# Patient Record
Sex: Female | Born: 1965 | Race: White | Hispanic: No | Smoking: Current every day smoker
Health system: Southern US, Community
[De-identification: ages and names within clinical notes are randomized; demographics above are authoritative.]

## PROBLEM LIST (undated history)

## (undated) DIAGNOSIS — A048 Other specified bacterial intestinal infections: Secondary | ICD-10-CM

## (undated) DIAGNOSIS — F419 Anxiety disorder, unspecified: Secondary | ICD-10-CM

## (undated) DIAGNOSIS — K589 Irritable bowel syndrome without diarrhea: Secondary | ICD-10-CM

## (undated) DIAGNOSIS — M549 Dorsalgia, unspecified: Secondary | ICD-10-CM

## (undated) DIAGNOSIS — IMO0001 Reserved for inherently not codable concepts without codable children: Secondary | ICD-10-CM

## (undated) DIAGNOSIS — R569 Unspecified convulsions: Secondary | ICD-10-CM

## (undated) DIAGNOSIS — K219 Gastro-esophageal reflux disease without esophagitis: Secondary | ICD-10-CM

## (undated) DIAGNOSIS — M791 Myalgia, unspecified site: Secondary | ICD-10-CM

## (undated) DIAGNOSIS — M797 Fibromyalgia: Secondary | ICD-10-CM

## (undated) DIAGNOSIS — G43909 Migraine, unspecified, not intractable, without status migrainosus: Secondary | ICD-10-CM

## (undated) DIAGNOSIS — E785 Hyperlipidemia, unspecified: Secondary | ICD-10-CM

## (undated) DIAGNOSIS — M1990 Unspecified osteoarthritis, unspecified site: Secondary | ICD-10-CM

## (undated) HISTORY — DX: Anxiety disorder, unspecified: F41.9

## (undated) HISTORY — DX: Reserved for inherently not codable concepts without codable children: IMO0001

## (undated) HISTORY — DX: Other specified bacterial intestinal infections: A04.8

## (undated) HISTORY — DX: Myalgia, unspecified site: M79.10

## (undated) HISTORY — DX: Irritable bowel syndrome, unspecified: K58.9

## (undated) HISTORY — DX: Gastro-esophageal reflux disease without esophagitis: K21.9

## (undated) HISTORY — PX: HIP ARTHROPLASTY: SHX981

## (undated) HISTORY — PX: ABDOMINAL HYSTERECTOMY: SHX81

## (undated) HISTORY — PX: FOOT SURGERY: SHX648

## (undated) HISTORY — DX: Hyperlipidemia, unspecified: E78.5

## (undated) HISTORY — DX: Migraine, unspecified, not intractable, without status migrainosus: G43.909

## (undated) HISTORY — PX: OTHER SURGICAL HISTORY: SHX169

---

## 2004-06-01 ENCOUNTER — Ambulatory Visit: Payer: Self-pay | Admitting: Family Medicine

## 2005-06-07 ENCOUNTER — Ambulatory Visit: Payer: Self-pay

## 2005-09-11 ENCOUNTER — Ambulatory Visit: Payer: Self-pay | Admitting: Gastroenterology

## 2006-02-17 HISTORY — PX: COLONOSCOPY: SHX174

## 2006-07-02 ENCOUNTER — Ambulatory Visit: Payer: Self-pay | Admitting: Family Medicine

## 2007-07-08 ENCOUNTER — Ambulatory Visit: Payer: Self-pay | Admitting: Family Medicine

## 2007-07-10 ENCOUNTER — Ambulatory Visit: Payer: Self-pay | Admitting: Family Medicine

## 2007-07-31 ENCOUNTER — Encounter: Admission: RE | Admit: 2007-07-31 | Discharge: 2007-07-31 | Payer: Self-pay | Admitting: Surgery

## 2007-08-21 HISTORY — PX: APPENDECTOMY: SHX54

## 2007-09-24 ENCOUNTER — Inpatient Hospital Stay: Payer: Self-pay | Admitting: Vascular Surgery

## 2008-07-01 ENCOUNTER — Encounter: Admission: RE | Admit: 2008-07-01 | Discharge: 2008-07-01 | Payer: Self-pay | Admitting: Surgery

## 2008-11-05 ENCOUNTER — Encounter: Admission: RE | Admit: 2008-11-05 | Discharge: 2008-11-05 | Payer: Self-pay | Admitting: Neurosurgery

## 2009-07-06 ENCOUNTER — Encounter: Admission: RE | Admit: 2009-07-06 | Discharge: 2009-07-06 | Payer: Self-pay | Admitting: Surgery

## 2009-09-15 ENCOUNTER — Encounter: Admission: RE | Admit: 2009-09-15 | Discharge: 2009-09-15 | Payer: Self-pay | Admitting: Neurosurgery

## 2010-07-10 ENCOUNTER — Encounter: Admission: RE | Admit: 2010-07-10 | Discharge: 2010-07-10 | Payer: Self-pay | Admitting: Surgery

## 2010-09-09 ENCOUNTER — Other Ambulatory Visit: Payer: Self-pay | Admitting: Surgery

## 2010-09-09 DIAGNOSIS — Z1239 Encounter for other screening for malignant neoplasm of breast: Secondary | ICD-10-CM

## 2010-11-11 ENCOUNTER — Ambulatory Visit: Payer: Self-pay | Admitting: Internal Medicine

## 2011-04-24 ENCOUNTER — Emergency Department: Payer: Self-pay | Admitting: Emergency Medicine

## 2011-06-26 ENCOUNTER — Ambulatory Visit: Payer: Self-pay | Admitting: Obstetrics and Gynecology

## 2011-07-02 ENCOUNTER — Ambulatory Visit: Payer: Self-pay | Admitting: Obstetrics and Gynecology

## 2011-07-16 ENCOUNTER — Ambulatory Visit
Admission: RE | Admit: 2011-07-16 | Discharge: 2011-07-16 | Disposition: A | Discharge status: Home / Self Care | Payer: 59 | Source: Ambulatory Visit | Attending: Surgery | Admitting: Surgery

## 2011-07-16 DIAGNOSIS — Z1239 Encounter for other screening for malignant neoplasm of breast: Secondary | ICD-10-CM

## 2011-08-17 ENCOUNTER — Emergency Department: Payer: Self-pay | Admitting: Emergency Medicine

## 2011-11-23 ENCOUNTER — Other Ambulatory Visit: Payer: Self-pay | Admitting: Neurosurgery

## 2011-11-23 DIAGNOSIS — M549 Dorsalgia, unspecified: Secondary | ICD-10-CM

## 2011-11-29 ENCOUNTER — Ambulatory Visit
Admission: RE | Admit: 2011-11-29 | Discharge: 2011-11-29 | Disposition: A | Discharge status: Home / Self Care | Payer: 59 | Source: Ambulatory Visit | Attending: Neurosurgery | Admitting: Neurosurgery

## 2011-11-29 DIAGNOSIS — M549 Dorsalgia, unspecified: Secondary | ICD-10-CM

## 2013-02-13 DIAGNOSIS — M069 Rheumatoid arthritis, unspecified: Secondary | ICD-10-CM | POA: Insufficient documentation

## 2013-02-13 DIAGNOSIS — G8929 Other chronic pain: Secondary | ICD-10-CM | POA: Insufficient documentation

## 2013-02-13 DIAGNOSIS — R519 Headache, unspecified: Secondary | ICD-10-CM | POA: Insufficient documentation

## 2013-02-16 DIAGNOSIS — F418 Other specified anxiety disorders: Secondary | ICD-10-CM | POA: Insufficient documentation

## 2013-04-22 ENCOUNTER — Other Ambulatory Visit: Payer: Self-pay | Admitting: Obstetrics and Gynecology

## 2013-04-22 DIAGNOSIS — Z1231 Encounter for screening mammogram for malignant neoplasm of breast: Secondary | ICD-10-CM

## 2013-05-13 ENCOUNTER — Ambulatory Visit
Admission: RE | Admit: 2013-05-13 | Discharge: 2013-05-13 | Disposition: A | Discharge status: Home / Self Care | Payer: Self-pay | Source: Ambulatory Visit | Attending: Obstetrics and Gynecology | Admitting: Obstetrics and Gynecology

## 2013-05-13 DIAGNOSIS — Z1231 Encounter for screening mammogram for malignant neoplasm of breast: Secondary | ICD-10-CM

## 2013-05-21 ENCOUNTER — Telehealth: Payer: Self-pay | Admitting: *Deleted

## 2013-05-21 NOTE — Telephone Encounter (Signed)
LEFT MESSAGE FOR PATIENT TO CALL BACK

## 2013-05-25 NOTE — Telephone Encounter (Signed)
EXPLAINED TO PATIENT THAT WE ARE UNABLE AT THIS TIME TO MOVE HER SURGERY DATE UP . PT UNDERSTOOD AND STATED THAT SHE MAY NEED TO RESCHEDULE SHE WILL CALL us BACK. WE ARE LEAVING HER ON THE SCHEDULE FOR 10.31.2014 FOR NOW

## 2013-06-04 ENCOUNTER — Encounter: Payer: Self-pay | Admitting: Podiatry

## 2013-06-04 ENCOUNTER — Encounter: Payer: Self-pay | Admitting: *Deleted

## 2013-06-04 ENCOUNTER — Ambulatory Visit (INDEPENDENT_AMBULATORY_CARE_PROVIDER_SITE_OTHER): Payer: Medicare Other | Admitting: Podiatry

## 2013-06-04 VITALS — BP 125/80 | HR 79 | Resp 16 | Ht 70.0 in | Wt 200.0 lb

## 2013-06-04 DIAGNOSIS — G5760 Lesion of plantar nerve, unspecified lower limb: Secondary | ICD-10-CM

## 2013-06-04 DIAGNOSIS — D219 Benign neoplasm of connective and other soft tissue, unspecified: Secondary | ICD-10-CM

## 2013-06-04 DIAGNOSIS — G588 Other specified mononeuropathies: Secondary | ICD-10-CM | POA: Insufficient documentation

## 2013-06-04 DIAGNOSIS — G5781 Other specified mononeuropathies of right lower limb: Secondary | ICD-10-CM

## 2013-06-04 NOTE — Progress Notes (Signed)
Abigail [name] presents today for chief complaint of a painful area to the dorsal lateral aspect of her right foot that is radiating into her third and fourth toes of her right foot. States that the pain is so bad she can hardly sleep at night. We are scheduled for surgery in the near future to release the second metatarsophalangeal joint right remove the screw to the third digit right.  Objective: Vital signs are stable she is alert and oriented x3. She's a little put out today because she had to wait in the lobby because she was late for her appointment. That was resolved. I reviewed her past medical history medications and allergies. Currently pulses to the right lower extremity are intact. She still has overlapping second toe over the third toe at this time. She also has an area of erythema between the third and fourth digits of the right foot. She has a palpable Mulder's click to the third interdigital space of the right foot. It appears that her neuroma has returned.  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  Plan: We discussed the etiology pathology conservative versus surgical therapies and at this time she would like to at a neurectomy to her consent form. This was performed today and we change the date of surgery. I also injected 2 mg of dexamethasone to the third interdigital space of the right foot. She tolerated this procedure well and I will followup with her at the time of surgery. We did discuss that she would probably have to use DILAUDID for postop pain control.

## 2013-06-04 NOTE — Progress Notes (Signed)
N HURT L RIGHT FOOT TOP AND BOTTOM AROUND 3RD and 4th DIGITS D 67M O SLOWLY C WORSE A EVERYTHING T SOAK IN EPSON SALT, ICE PACK, HEATING PAD, OINTMENT

## 2013-06-24 ENCOUNTER — Ambulatory Visit: Payer: 59 | Admitting: Podiatry

## 2013-06-25 ENCOUNTER — Encounter: Payer: Self-pay | Admitting: Podiatry

## 2013-07-23 ENCOUNTER — Ambulatory Visit: Payer: 59 | Admitting: Podiatry

## 2013-07-27 ENCOUNTER — Ambulatory Visit: Payer: 59 | Admitting: Podiatry

## 2013-07-30 ENCOUNTER — Encounter: Payer: Self-pay | Admitting: Podiatry

## 2013-07-30 ENCOUNTER — Ambulatory Visit (INDEPENDENT_AMBULATORY_CARE_PROVIDER_SITE_OTHER): Payer: Medicare Other

## 2013-07-30 ENCOUNTER — Ambulatory Visit: Payer: Medicare Other | Admitting: Podiatry

## 2013-07-30 VITALS — BP 122/78 | HR 70 | Resp 16

## 2013-07-30 DIAGNOSIS — B351 Tinea unguium: Secondary | ICD-10-CM

## 2013-07-30 DIAGNOSIS — R52 Pain, unspecified: Secondary | ICD-10-CM

## 2013-07-30 DIAGNOSIS — M7750 Other enthesopathy of unspecified foot: Secondary | ICD-10-CM

## 2013-07-30 DIAGNOSIS — M778 Other enthesopathies, not elsewhere classified: Secondary | ICD-10-CM

## 2013-07-30 NOTE — Progress Notes (Signed)
   Subjective:    Patient ID: Abigail [name], female    DOB: Nov 24, 1965, 47 y.o.   MRN: [id number]  HPI Comments: Left ankle i would like a xray for , i fell out of the kitchen door and its been swollen and hurting      Review of Systems     Objective:   Physical Exam: I have reviewed her past medical history medications allergies. Vascular evaluation is intact. She has pain on palpation third metatarsophalangeal joint of the right foot. As well as pain to the dorsal aspect of the left foot. Radiographic evaluation of the left ankle does not demonstrate any type of osseous abnormalities. No soft tissue malformations can be seen.        Assessment & Plan:  Assessment: Capsulitis third metatarsophalangeal joint right foot with neuroma. 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 No ankle abnormalities.  Plan: Injected around the third metatarsal phalangeal joint today. Injected the dorsal aspect of the left foot today.

## 2013-08-31 ENCOUNTER — Telehealth: Payer: Self-pay | Admitting: *Deleted

## 2013-08-31 NOTE — Telephone Encounter (Signed)
PT CALLED AND CANCELLED SURGERY ON 1.16.15 WITH DR Milinda Pointer DUE TO NOT HAVING ANY INSURANCE UNTIL 2.1.15. I HAD CALLED AND L/M WITH PT LETTING HER KNOW I RECEIVED MESSAGE ABOUT CANCELLING SURGERY AND TO CALL BACK WHEN SHE HAD HER INS INFO STRAIGHTENED OUT SO WE CAN [facility] SURGERY AGAIN.

## 2013-09-03 ENCOUNTER — Encounter: Payer: Self-pay | Admitting: Podiatry

## 2013-09-10 ENCOUNTER — Encounter: Payer: Self-pay | Admitting: Podiatry

## 2013-10-13 ENCOUNTER — Emergency Department: Payer: Self-pay | Admitting: Emergency Medicine

## 2013-10-13 LAB — URINALYSIS, COMPLETE
BACTERIA: NONE SEEN
BILIRUBIN, UR: NEGATIVE
BLOOD: NEGATIVE
Glucose,UR: NEGATIVE mg/dL (ref 0–75)
KETONE: NEGATIVE
Leukocyte Esterase: NEGATIVE
NITRITE: NEGATIVE
PH: 8 (ref 4.5–8.0)
Protein: NEGATIVE
RBC,UR: 1 /HPF (ref 0–5)
Specific Gravity: 1.011 (ref 1.003–1.030)
WBC UR: <1 /HPF (ref 0–5)

## 2013-10-13 LAB — DRUG SCREEN, URINE

## 2013-10-13 LAB — COMPREHENSIVE METABOLIC PANEL
AST: 61 U/L — AB (ref 15–37)
Albumin: 3.4 g/dL (ref 3.4–5.0)
Alkaline Phosphatase: 129 U/L — ABNORMAL HIGH
Anion Gap: 6 — ABNORMAL LOW (ref 7–16)
BILIRUBIN TOTAL: 0.5 mg/dL (ref 0.2–1.0)
BUN: 9 mg/dL (ref 7–18)
CALCIUM: 8.5 mg/dL (ref 8.5–10.1)
CHLORIDE: 106 mmol/L (ref 98–107)
Co2: 24 mmol/L (ref 21–32)
Creatinine: 0.7 mg/dL (ref 0.60–1.30)
EGFR (Non-African Amer.): >60
GLUCOSE: 99 mg/dL (ref 65–99)
Osmolality: 271 (ref 275–301)
POTASSIUM: 4 mmol/L (ref 3.5–5.1)
SGPT (ALT): 44 U/L (ref 12–78)
SODIUM: 136 mmol/L (ref 136–145)
Total Protein: 7.2 g/dL (ref 6.4–8.2)

## 2013-10-13 LAB — CBC
HCT: 36.7 % (ref 35.0–47.0)
HGB: 11.8 g/dL — AB (ref 12.0–16.0)
MCH: 30.6 pg (ref 26.0–34.0)
MCHC: 32 g/dL (ref 32.0–36.0)
MCV: 96 fL (ref 80–100)
PLATELETS: 115 10*3/uL — AB (ref 150–440)
RBC: 3.84 10*6/uL (ref 3.80–5.20)
RDW: 14 % (ref 11.5–14.5)
WBC: 8.5 10*3/uL (ref 3.6–11.0)

## 2013-10-13 LAB — LIPASE, BLOOD: Lipase: 85 U/L (ref 73–393)

## 2013-12-14 ENCOUNTER — Ambulatory Visit (INDEPENDENT_AMBULATORY_CARE_PROVIDER_SITE_OTHER): Payer: Medicare Other | Admitting: Podiatry

## 2013-12-14 ENCOUNTER — Encounter: Payer: Self-pay | Admitting: Podiatry

## 2013-12-14 ENCOUNTER — Ambulatory Visit (INDEPENDENT_AMBULATORY_CARE_PROVIDER_SITE_OTHER): Payer: Medicare Other

## 2013-12-14 VITALS — BP 120/75 | HR 73 | Resp 16

## 2013-12-14 DIAGNOSIS — M779 Enthesopathy, unspecified: Secondary | ICD-10-CM

## 2013-12-14 NOTE — Progress Notes (Signed)
She presents today with a chief complaint of painful dorsal lateral aspect of her left foot.  Objective: Vital signs are stable she is alert and oriented x3. She has pain on palpation and on in range of motion of the subtalar joint left foot. She has mild erythema and warmth on palpation of the left foot.  Assessment: Capsulitis subtalar joint left.  Plan: Injected the subtalar joint today with Kenalog and local anesthetic after sterile Betadine skin prep. Followup with her as needed.

## 2014-01-07 ENCOUNTER — Ambulatory Visit: Payer: Medicare Other | Admitting: Podiatry

## 2014-01-28 ENCOUNTER — Ambulatory Visit: Payer: Medicare Other | Admitting: Podiatry

## 2014-02-10 ENCOUNTER — Ambulatory Visit (INDEPENDENT_AMBULATORY_CARE_PROVIDER_SITE_OTHER): Payer: Medicare Other | Admitting: Podiatry

## 2014-02-10 VITALS — BP 119/64 | HR 84 | Resp 16

## 2014-02-10 DIAGNOSIS — G5760 Lesion of plantar nerve, unspecified lower limb: Secondary | ICD-10-CM

## 2014-02-10 DIAGNOSIS — M7750 Other enthesopathy of unspecified foot: Secondary | ICD-10-CM

## 2014-02-10 DIAGNOSIS — G5781 Other specified mononeuropathies of right lower limb: Secondary | ICD-10-CM

## 2014-02-10 DIAGNOSIS — M779 Enthesopathy, unspecified: Principal | ICD-10-CM

## 2014-02-10 DIAGNOSIS — M778 Other enthesopathies, not elsewhere classified: Secondary | ICD-10-CM

## 2014-02-10 NOTE — Progress Notes (Signed)
She presents today for a followup of her subtalar joint capsulitis of her left foot which is been bothering her considerably. She's also complaining of painful neuroma to the second interdigital space of the right foot. She also has reactive hyperkeratosis plantar aspect of the bilateral foot.  Objective: Pulses are palpable bilateral. She has pain on palpation and on in range of motion of the top sinus tarsi and subtalar joint of the left foot. She has a palpable neuroma to the second intermetatarsal space of the right foot.  Assessment: Neuroma right foot second interdigital space. Subtalar joint capsulitis left foot. Reactive hyperkeratosis bilateral.  Plan: Dispensed a tri-lock brace today injected the subtalar joint after sterile Betadine skin prep and injected the neuroma right foot. I will followup with her in a few weeks.

## 2014-04-12 ENCOUNTER — Ambulatory Visit (INDEPENDENT_AMBULATORY_CARE_PROVIDER_SITE_OTHER): Payer: Medicare Other | Admitting: Podiatry

## 2014-04-12 VITALS — BP 130/77 | HR 75 | Resp 16

## 2014-04-12 DIAGNOSIS — G5760 Lesion of plantar nerve, unspecified lower limb: Secondary | ICD-10-CM

## 2014-04-12 DIAGNOSIS — M779 Enthesopathy, unspecified: Secondary | ICD-10-CM

## 2014-04-12 DIAGNOSIS — G5781 Other specified mononeuropathies of right lower limb: Secondary | ICD-10-CM

## 2014-04-12 NOTE — Progress Notes (Signed)
She presents today for followup of her capsulitis subtalar joint left foot. She states it is doing better than it was previously. She's also here for followup of her neuroma second interdigital space of the right foot.  Objective: Vital signs are stable she is alert and oriented x3 she has a palpable Mulder's click to the second interdigital space of the right foot. She has pain on palpation and on in range of motion of the subtalar joint left foot.   assessment: Pain in limb secondary to neuroma second interdigital space right foot capsulitis subtalar joint left.  Plan: Injected both sites today second dose of dehydrated alcohol right foot and Kenalog to the subtalar joint. When she is ready for surgery an MRI of the left foot will be necessary. At that time we will be looking for subtalar joint and ankle injuries.

## 2014-05-16 ENCOUNTER — Emergency Department: Payer: Self-pay | Admitting: Emergency Medicine

## 2014-05-24 ENCOUNTER — Emergency Department: Payer: Self-pay | Admitting: Emergency Medicine

## 2014-05-24 LAB — URINALYSIS, COMPLETE
BILIRUBIN, UR: NEGATIVE
Bacteria: NONE SEEN
Blood: NEGATIVE
Glucose,UR: NEGATIVE mg/dL (ref 0–75)
KETONE: NEGATIVE
LEUKOCYTE ESTERASE: NEGATIVE
NITRITE: NEGATIVE
PROTEIN: NEGATIVE
Ph: 6 (ref 4.5–8.0)
RBC,UR: 1 /HPF (ref 0–5)
Specific Gravity: 1.015 (ref 1.003–1.030)
WBC UR: <1 /HPF (ref 0–5)

## 2014-05-24 LAB — LIPASE, BLOOD: LIPASE: 120 U/L (ref 73–393)

## 2014-05-24 LAB — COMPREHENSIVE METABOLIC PANEL
ALBUMIN: 3.4 g/dL (ref 3.4–5.0)
ALT: 28 U/L
ANION GAP: 4 — AB (ref 7–16)
Alkaline Phosphatase: 98 U/L
BILIRUBIN TOTAL: 0.2 mg/dL (ref 0.2–1.0)
BUN: 7 mg/dL (ref 7–18)
CHLORIDE: 106 mmol/L (ref 98–107)
CO2: 31 mmol/L (ref 21–32)
Calcium, Total: 8.9 mg/dL (ref 8.5–10.1)
Creatinine: 0.8 mg/dL (ref 0.60–1.30)
Glucose: 100 mg/dL — ABNORMAL HIGH (ref 65–99)
OSMOLALITY: 279 (ref 275–301)
Potassium: 3.8 mmol/L (ref 3.5–5.1)
SGOT(AST): 24 U/L (ref 15–37)
Sodium: 141 mmol/L (ref 136–145)
Total Protein: 7.7 g/dL (ref 6.4–8.2)

## 2014-05-24 LAB — CBC WITH DIFFERENTIAL/PLATELET
BASOS ABS: 0.1 10*3/uL (ref 0.0–0.1)
BASOS PCT: 0.8 %
EOS ABS: 0.1 10*3/uL (ref 0.0–0.7)
Eosinophil %: 0.7 %
HCT: 44.1 % (ref 35.0–47.0)
HGB: 14.2 g/dL (ref 12.0–16.0)
Lymphocyte #: 2.5 10*3/uL (ref 1.0–3.6)
Lymphocyte %: 21.2 %
MCH: 30.8 pg (ref 26.0–34.0)
MCHC: 32.1 g/dL (ref 32.0–36.0)
MCV: 96 fL (ref 80–100)
MONOS PCT: 8 %
Monocyte #: 0.9 x10 3/mm (ref 0.2–0.9)
NEUTROS ABS: 8.1 10*3/uL — AB (ref 1.4–6.5)
Neutrophil %: 69.3 %
Platelet: 349 10*3/uL (ref 150–440)
RBC: 4.6 10*6/uL (ref 3.80–5.20)
RDW: 15.1 % — ABNORMAL HIGH (ref 11.5–14.5)
WBC: 11.7 10*3/uL — AB (ref 3.6–11.0)

## 2014-05-24 LAB — TROPONIN I

## 2014-05-26 ENCOUNTER — Ambulatory Visit: Payer: Medicare Other | Admitting: Podiatry

## 2014-07-20 ENCOUNTER — Other Ambulatory Visit: Payer: Self-pay | Admitting: Family Medicine

## 2014-07-20 DIAGNOSIS — Z1231 Encounter for screening mammogram for malignant neoplasm of breast: Secondary | ICD-10-CM

## 2014-07-27 ENCOUNTER — Ambulatory Visit: Payer: Self-pay

## 2014-08-03 ENCOUNTER — Encounter (INDEPENDENT_AMBULATORY_CARE_PROVIDER_SITE_OTHER): Payer: Self-pay

## 2014-08-03 ENCOUNTER — Ambulatory Visit
Admission: RE | Admit: 2014-08-03 | Discharge: 2014-08-03 | Disposition: A | Discharge status: Home / Self Care | Payer: Medicare Other | Source: Ambulatory Visit | Attending: Family Medicine | Admitting: Family Medicine

## 2014-08-03 DIAGNOSIS — Z1231 Encounter for screening mammogram for malignant neoplasm of breast: Secondary | ICD-10-CM

## 2015-01-15 ENCOUNTER — Emergency Department
Admission: EM | Admit: 2015-01-15 | Discharge: 2015-01-15 | Disposition: A | Discharge status: Home / Self Care | Payer: Medicare Other | Attending: Emergency Medicine | Admitting: Emergency Medicine

## 2015-01-15 ENCOUNTER — Encounter: Payer: Self-pay | Admitting: Emergency Medicine

## 2015-01-15 DIAGNOSIS — Z7982 Long term (current) use of aspirin: Secondary | ICD-10-CM | POA: Insufficient documentation

## 2015-01-15 DIAGNOSIS — R609 Edema, unspecified: Secondary | ICD-10-CM | POA: Insufficient documentation

## 2015-01-15 DIAGNOSIS — R600 Localized edema: Secondary | ICD-10-CM

## 2015-01-15 DIAGNOSIS — Z79899 Other long term (current) drug therapy: Secondary | ICD-10-CM | POA: Insufficient documentation

## 2015-01-15 DIAGNOSIS — L03317 Cellulitis of buttock: Secondary | ICD-10-CM

## 2015-01-15 DIAGNOSIS — Z87891 Personal history of nicotine dependence: Secondary | ICD-10-CM | POA: Insufficient documentation

## 2015-01-15 HISTORY — DX: Dorsalgia, unspecified: M54.9

## 2015-01-15 HISTORY — DX: Unspecified osteoarthritis, unspecified site: M19.90

## 2015-01-15 HISTORY — DX: Fibromyalgia: M79.7

## 2015-01-15 LAB — BASIC METABOLIC PANEL
Anion gap: 10 (ref 5–15)
CALCIUM: 8.7 mg/dL — AB (ref 8.9–10.3)
CO2: 28 mmol/L (ref 22–32)
Chloride: 102 mmol/L (ref 101–111)
Creatinine, Ser: 0.8 mg/dL (ref 0.44–1.00)
GFR calc Af Amer: >60 mL/min (ref >60)
GFR calc non Af Amer: >60 mL/min (ref >60)
GLUCOSE: 121 mg/dL — AB (ref 65–99)
Potassium: 3.3 mmol/L — ABNORMAL LOW (ref 3.5–5.1)
Sodium: 140 mmol/L (ref 135–145)

## 2015-01-15 LAB — CBC WITH DIFFERENTIAL/PLATELET
BASOS ABS: 0 10*3/uL (ref 0–0.1)
Basophils Relative: 1 %
EOS ABS: 0.2 10*3/uL (ref 0–0.7)
Eosinophils Relative: 2 %
HEMATOCRIT: 38 % (ref 35.0–47.0)
HEMOGLOBIN: 12.5 g/dL (ref 12.0–16.0)
Lymphocytes Relative: 28 %
Lymphs Abs: 2.3 10*3/uL (ref 1.0–3.6)
MCH: 30.1 pg (ref 26.0–34.0)
MCHC: 32.8 g/dL (ref 32.0–36.0)
MCV: 91.7 fL (ref 80.0–100.0)
Monocytes Absolute: 0.6 10*3/uL (ref 0.2–0.9)
Monocytes Relative: 8 %
Neutro Abs: 5.1 10*3/uL (ref 1.4–6.5)
Neutrophils Relative %: 61 %
Platelets: 285 10*3/uL (ref 150–440)
RBC: 4.14 MIL/uL (ref 3.80–5.20)
RDW: 13.7 % (ref 11.5–14.5)
WBC: 8.3 10*3/uL (ref 3.6–11.0)

## 2015-01-15 MED ORDER — HYDROCHLOROTHIAZIDE 12.5 MG PO TABS: 25.0000 mg | ORAL_TABLET | Freq: Every day | ORAL | Status: DC

## 2015-01-15 MED ORDER — CYCLOBENZAPRINE HCL 10 MG PO TABS: 10.0000 mg | ORAL_TABLET | Freq: Three times a day (TID) | ORAL | Status: DC | PRN

## 2015-01-15 MED ORDER — DOXYCYCLINE HYCLATE 100 MG PO TABS: 100.0000 mg | ORAL_TABLET | Freq: Two times a day (BID) | ORAL | Status: DC

## 2015-01-15 NOTE — Discharge Instructions (Signed)
Cellulitis Cellulitis is an infection of the skin and the tissue under the skin. The infected area is usually red and tender. This happens most often in the arms and lower legs. HOME CARE   Take your antibiotic medicine as told. Finish the medicine even if you start to feel better.  Keep the infected arm or leg raised (elevated).  Put a warm cloth on the area up to 4 times per day.  Only take medicines as told by your doctor.  Keep all doctor visits as told. GET HELP IF:  You see red streaks on the skin coming from the infected area.  Your red area gets bigger or turns a dark color.  Your bone or joint under the infected area is painful after the skin heals.  Your infection comes back in the same area or different area.  You have a puffy (swollen) bump in the infected area.  You have new symptoms.  You have a fever. GET HELP RIGHT AWAY IF:   You feel very sleepy.  You throw up (vomit) or have watery poop (diarrhea).  You feel sick and have muscle aches and pains. MAKE SURE YOU:   Understand these instructions.  Will watch your condition.  Will get help right away if you are not doing well or get worse. Document Released: 01/23/2008 Document Revised: 12/21/2013 Document Reviewed: 10/22/2011 [facility] Patient Information 2015 [place], [place]. This information is not intended to replace advice given to you by your health care provider. Make sure you discuss any questions you have with your health care provider.  Keep the wound clean, dry, and covered with antibiotic ointment. Follow-up with your provider as needed.

## 2015-01-15 NOTE — ED Provider Notes (Signed)
[facility] Emergency Department Provider Note ____________________________________________  Time seen: 1235  I have reviewed the triage vital signs and the nursing notes.  HISTORY  Chief Complaint Abscess  HPI Abigail [name] is a 49 y.o. female who reports to the ED with complaints of a tender, swollen area to the right buttocks since about a week ago. She describes initially thought was an insect bite, and she does admit to squeezing and ashen without significant relief. The next day after onset, she notes the area of redness was much larger. By Tuesday, 4 days ago, she noted spontaneous draining from the area area today she is here after being sent by her primary care provider's office for evaluation this lesion, as well as swelling to her lower legs for better than a week. She denies any leg pain or cramping, and does admit that she has been off of her HCTZ for about 2 months now.She is without fever, chills, sweats, nausea or any recent drainage from the lesion. She rates her pain a 6 out of 10 at triage  Past Medical History  Diagnosis Date  . Muscle pain   . IBS (irritable bowel syndrome)   . Reflux   . Migraines   . Anxiety   . Fibromyalgia   . Back pain   . Arthritis    Patient Active Problem List   Diagnosis Date Noted  . 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 Appendectomy    . Foot surgery    . C5 fusion    . C6 fusion    . C7 fusion    . L4 fusion    . L5 fusion    . S1 fusion    . Abdominal hysterectomy    . Hip arthroplasty     Current Outpatient Rx  Name  Route  Sig  Dispense  Refill  . ALPRAZolam (XANAX) 1 MG tablet      1 mg. TAKE THREE TIMES A DAY IF NEEDED         . Aspirin-Salicylamide-Caffeine (BC HEADACHE POWDER PO)   Oral   Take by mouth. TAKE 2-3 TIMES DAILY         . buPROPion (WELLBUTRIN XL) 300 MG 24 hr tablet   Oral   Take 300 mg by mouth 2 (two) times  daily.         . cyclobenzaprine (FLEXERIL) 10 MG tablet   Oral   Take 10 mg by mouth 3 (three) times daily as needed for muscle spasms.         . cyclobenzaprine (FLEXERIL) 10 MG tablet   Oral   Take 1 tablet (10 mg total) by mouth 3 (three) times daily as needed for muscle spasms.   15 tablet   0   . doxycycline (VIBRA-TABS) 100 MG tablet   Oral   Take 1 tablet (100 mg total) by mouth 2 (two) times daily.   20 tablet   0   . DULoxetine (CYMBALTA) 60 MG capsule   Oral   Take 60 mg by mouth 2 (two) times daily.         . folic acid (FOLVITE) 1 MG tablet   Oral   Take 1 mg by mouth daily.         Marland Kitchen gabapentin (NEURONTIN) 600 MG tablet      600 mg. TAKE TWO IN THE AM, TAKE ONE IN THE AFTERNOON AND TWO AT BEDTIME         .  hydrochlorothiazide (HYDRODIURIL) 12.5 MG tablet   Oral   Take 2 tablets (25 mg total) by mouth daily.   30 tablet   0   . methotrexate (RHEUMATREX) 2.5 MG tablet      Caution:Chemotherapy. Protect from light. TAKE EIGHT TABLETS AT ONCE, ONCE A WEEK         . oxyCODONE-acetaminophen (PERCOCET) 10-325 MG per tablet   Oral   Take 1 tablet by mouth every 4 (four) hours as needed for pain.          Allergies Codeine; Darvocet; Sulfa antibiotics; and Tape  No family history on file.  Social History History  Substance Use Topics  . Smoking status: Former Smoker -- 1.00 packs/day    Types: Cigarettes  . Smokeless tobacco: Current User     Comment: ELECTRONIC VAPOR  . Alcohol Use: Yes     Comment: OCCASIONALLY   Review of Systems  Constitutional: Negative for fever. Eyes: Negative for visual changes. ENT: Negative for sore throat. Cardiovascular: Negative for chest pain. Respiratory: Negative for shortness of breath. Gastrointestinal: Negative for abdominal pain, vomiting and diarrhea. Genitourinary: Negative for dysuria. Musculoskeletal: Negative for back pain. Skin: Negative for rash. Positive for cellulitis. Neurological:  Negative for headaches, focal weakness or numbness. ____________________________________________  PHYSICAL EXAM:  VITAL SIGNS: ED Triage Vitals  Enc Vitals Group     BP 01/15/15 1129 117/57 mmHg     Pulse Rate 01/15/15 1129 83     Resp --      Temp 01/15/15 1129 97.7 F (36.5 C)     Temp Source 01/15/15 1129 Oral     SpO2 01/15/15 1129 97 %     Weight 01/15/15 1129 215 lb (97.523 kg)     Height 01/15/15 1129 5' 9.5" (1.765 m)     Head Cir --      Peak Flow --      Pain Score 01/15/15 1129 6     Pain Loc --      Pain Edu? --      Excl. in [place]? --    Constitutional: Alert and oriented. Well appearing and in no distress. Eyes: Conjunctivae are normal. PERRL. Normal extraocular movements. ENT   Head: Normocephalic and atraumatic.   Nose: No congestion/rhinnorhea.   Mouth/Throat: Mucous membranes are moist.   Neck: No stridor. Hematological/Lymphatic/Immunilogical: No cervical lymphadenopathy. Cardiovascular: Normal rate, regular rhythm.  Respiratory: Normal respiratory effort.No wheezes/rales/rhonchi. Gastrointestinal: Soft and nontender. No distention. Musculoskeletal: Nontender with normal range of motion in all extremities.No lower extremity tenderness nor edema. Neurologic:  Normal speech and language. No gross focal neurologic deficits are appreciated. Skin:  Skin is warm, dry and intact. No rash noted. Right buttock with a 1 cm, well-circumscribed shallow ulcerative lesion. Central escar noted. No active drainage. Minimal induration noted.  Psychiatric: Mood and affect are normal. Patient exhibits appropriate insight and judgment. ____________________________________________   LABS  Labs Reviewed  BASIC METABOLIC PANEL - Abnormal; Notable for the following:    Potassium 3.3 (*)    Glucose, Bld 121 (*)    BUN <5 (*)    Calcium 8.7 (*)    All other components within normal limits  CBC WITH DIFFERENTIAL/PLATELET   INITIAL IMPRESSION / ASSESSMENT AND PLAN /  ED COURSE  Vital signs stable. Treatment for a superficial cellulitis to the right buttock. Prescription Doxycycline and cyclobenzaprine and HCTZ as a courtesy given to patient. Wound care and follow-up instructions given to the patient.  ____________________________________________  FINAL CLINICAL IMPRESSION(S) /  ED DIAGNOSES  Final diagnoses:  Cellulitis of buttock  Edema extremities     Melvenia Needles, PA-C 01/15/15 1406  Harvest Dark, MD 01/15/15 1501

## 2015-01-15 NOTE — ED Notes (Signed)
Possible tick bite/ bug bite / abscess to buttocks with drainage with bilateral leg swelling, no fever .

## 2015-07-06 ENCOUNTER — Ambulatory Visit (INDEPENDENT_AMBULATORY_CARE_PROVIDER_SITE_OTHER): Payer: Medicare Other | Admitting: Podiatry

## 2015-07-06 ENCOUNTER — Encounter: Payer: Self-pay | Admitting: Podiatry

## 2015-07-06 VITALS — BP 141/88 | HR 90 | Resp 18

## 2015-07-06 DIAGNOSIS — G5781 Other specified mononeuropathies of right lower limb: Secondary | ICD-10-CM

## 2015-07-06 DIAGNOSIS — L02611 Cutaneous abscess of right foot: Secondary | ICD-10-CM | POA: Diagnosis not present

## 2015-07-06 DIAGNOSIS — G5761 Lesion of plantar nerve, right lower limb: Secondary | ICD-10-CM

## 2015-07-06 DIAGNOSIS — M778 Other enthesopathies, not elsewhere classified: Secondary | ICD-10-CM

## 2015-07-06 DIAGNOSIS — M7750 Other enthesopathy of unspecified foot: Secondary | ICD-10-CM

## 2015-07-06 DIAGNOSIS — M779 Enthesopathy, unspecified: Secondary | ICD-10-CM

## 2015-07-06 DIAGNOSIS — L03031 Cellulitis of right toe: Secondary | ICD-10-CM | POA: Diagnosis not present

## 2015-07-06 MED ORDER — DOXYCYCLINE HYCLATE 100 MG PO TABS: 100.0000 mg | ORAL_TABLET | Freq: Two times a day (BID) | ORAL | Status: DC

## 2015-07-06 NOTE — Progress Notes (Signed)
She presents today with chief complaint of painful foot bilateral. She states that her little toe right foot is sore where she tore the nail off. She states that she still has pain between her third and fourth digits of her right foot which radiates up into her toes and up her foot and leg. She also has pain in her sinus tarsi area she points to that and states that it just aches. He denies change in her past medical history medications allergies surgeries and social history. She's being treated for rheumatology and chronic pain.  Objective: Vital signs are stable she is alert and oriented 3 pulses are palpable. Palpable Mulder's click to the third interdigital space of the right foot. Other neurologic sensorium is intact. Muscle strength is normal. Orthopedic evaluation of his wrist pain on end range of motion of the subtalar joint left. Cellulitis fifth digit right foot extending to the level of the PIPJ no drainage no. Hallux valgus deformity of the right foot with lateral deviation of the second toe at the PIPJ where an arthroplasty was performed previously.  Assessment: Neuroma third interdigital space right foot. Cellulitis fifth digit right. And subtalar joint osteoarthritis and capsulitis. Digital deformity second digit right with hallux valgus deformity.  Plan: Discussed etiology pathology conservative versus surgical therapies. Injected neuroma with Kenalog and local anesthetic. Injected Kenalog and local anesthetic to the sinus tarsi subtalar joint left foot. Started her on doxycycline twice daily 100 mg for 10 days. She will watch for signs and symptoms of worsening infection should any arise she will notify us immediately. Otherwise I will follow up with her in about 6 weeks. Discussed the need for surgical correction regarding the right foot second digit and bunion. We also will consider removing the screw from the third digit right foot.

## 2015-07-27 ENCOUNTER — Ambulatory Visit (INDEPENDENT_AMBULATORY_CARE_PROVIDER_SITE_OTHER): Payer: Medicare Other | Admitting: Podiatry

## 2015-07-27 ENCOUNTER — Encounter: Payer: Self-pay | Admitting: Podiatry

## 2015-07-27 VITALS — BP 136/84 | HR 74 | Resp 16

## 2015-07-27 DIAGNOSIS — G5761 Lesion of plantar nerve, right lower limb: Secondary | ICD-10-CM | POA: Diagnosis not present

## 2015-07-27 DIAGNOSIS — G5781 Other specified mononeuropathies of right lower limb: Secondary | ICD-10-CM

## 2015-07-27 NOTE — Progress Notes (Signed)
She presents today for follow-up of her infected toes 4 and 5 of the right foot. She states they're doing much better. She states that I know I need to have the surgery performed to the second and first toes on the right foot but I do still have time to do that yet.  Objective: Vital signs are stable she's alert and oriented 3 no erythema edema saline as drainage or odor to the fourth or fifth digits of the right foot. This appears to have gone to heal uneventfully.  Assessment: Well-healing cellulitis toes 4 and 5 right.  Plan: Follow up with me when necessary.

## 2016-01-05 ENCOUNTER — Encounter: Payer: Self-pay | Admitting: Podiatry

## 2016-01-05 ENCOUNTER — Telehealth: Payer: Self-pay | Admitting: Podiatry

## 2016-01-05 ENCOUNTER — Ambulatory Visit (INDEPENDENT_AMBULATORY_CARE_PROVIDER_SITE_OTHER): Payer: Medicare Other | Admitting: Podiatry

## 2016-01-05 ENCOUNTER — Ambulatory Visit (INDEPENDENT_AMBULATORY_CARE_PROVIDER_SITE_OTHER): Payer: Medicare Other

## 2016-01-05 VITALS — BP 141/80 | HR 88 | Resp 16

## 2016-01-05 DIAGNOSIS — T847XXS Infection and inflammatory reaction due to other internal orthopedic prosthetic devices, implants and grafts, sequela: Secondary | ICD-10-CM

## 2016-01-05 MED ORDER — AMOXICILLIN-POT CLAVULANATE 500-125 MG PO TABS: 1.0000 | ORAL_TABLET | Freq: Three times a day (TID) | ORAL | Status: DC

## 2016-01-05 NOTE — Progress Notes (Signed)
She presents today for chief complaint of a painful third digit right foot. She states there has been some oozing and draining from the tip of the toe which is exquisitely painful palpation she states that it seems to be opening up as a screw may be coming out.  Objective: Vital signs are stable alert and oriented 3. She does have a screw to the toe based on radiographs and it does appear to possibly have a proud head. I see no signs of bone breakdown. There is some mild erythema to the toe in a purulence and no malodor.  Assessment: Mild cellulitis third digit right foot possibly secondary to internal fixation.  Plan: We consented her today for excision or removal of the screw from the third digit of the right foot. I answered all the questions regarding this procedure to the best of my ability in layman's terms. She understood it was amenable to it. I also suggested that she start soaking the tenderness is also warm water I also recommended that she start Augmentin 500 mg 1 by mouth twice a day follow up with her in the near future for surgery.

## 2016-01-05 NOTE — Patient Instructions (Signed)

## 2016-01-05 NOTE — Telephone Encounter (Signed)
This patient called last night at 9:45.  She said her toe with a rod had become red and swollen and had become painful.  The third toe was even draining clear fluid.  Negative for chills and fever.  She had been attempting to get in to see her doctor but was unable to be scheduled until June.  I asked her if she had antibiotics at home and she said she had three amoxicillin remaining.  I told her to take two immediately and one in the morning.  She was to call for an appointment in the morning where x-rays and an evaluation and treatment could be given.   Gardiner Barefoot DPM

## 2016-01-18 ENCOUNTER — Ambulatory Visit: Payer: Medicare Other | Admitting: Podiatry

## 2016-01-18 ENCOUNTER — Other Ambulatory Visit: Payer: Self-pay | Admitting: Family Medicine

## 2016-01-18 DIAGNOSIS — Z1231 Encounter for screening mammogram for malignant neoplasm of breast: Secondary | ICD-10-CM

## 2016-01-25 ENCOUNTER — Other Ambulatory Visit: Payer: Self-pay | Admitting: Podiatry

## 2016-01-25 ENCOUNTER — Ambulatory Visit: Payer: Medicare Other | Admitting: Podiatry

## 2016-01-25 MED ORDER — CEPHALEXIN 500 MG PO CAPS: 500.0000 mg | ORAL_CAPSULE | Freq: Three times a day (TID) | ORAL | Status: DC

## 2016-01-25 MED ORDER — OXYCODONE-ACETAMINOPHEN 10-325 MG PO TABS: 1.0000 | ORAL_TABLET | Freq: Four times a day (QID) | ORAL | Status: DC | PRN

## 2016-01-26 ENCOUNTER — Ambulatory Visit
Admission: RE | Admit: 2016-01-26 | Discharge: 2016-01-26 | Disposition: A | Discharge status: Home / Self Care | Payer: Medicare Other | Source: Ambulatory Visit | Attending: Family Medicine | Admitting: Family Medicine

## 2016-01-26 DIAGNOSIS — Z1231 Encounter for screening mammogram for malignant neoplasm of breast: Secondary | ICD-10-CM

## 2016-01-27 ENCOUNTER — Encounter: Payer: Self-pay | Admitting: Podiatry

## 2016-01-27 ENCOUNTER — Telehealth: Payer: Self-pay | Admitting: *Deleted

## 2016-01-27 DIAGNOSIS — Z4889 Encounter for other specified surgical aftercare: Secondary | ICD-10-CM | POA: Diagnosis not present

## 2016-01-27 MED ORDER — CLINDAMYCIN HCL 150 MG PO CAPS: ORAL_CAPSULE | ORAL | Status: DC

## 2016-01-27 NOTE — Telephone Encounter (Addendum)
Judeen Hammans states pt is receiving another strength Oxycodone from another doctor and was written for Oxycodone by Dr. Milinda Pointer.  Judeen Hammans states pt is allergic to Keflex also.  I left message with Clarise Cruz at [facility] to inform Dr. Milinda Pointer, and I "in Basket" messaged.  Dr. Milinda Pointer states pt said she was having trouble with her liver, so he hand wrote her for OXYCODONE 5MG , and as far as the Keflex she was able to take it previously without problems.  Dr. Milinda Pointer states order Clindamycin 150mg  #20 one capsule bid. I informed Judeen Hammans - [facility] of Dr. Stephenie Acres change of Oxycodone and told her to hold Dr. Stephenie Acres rx until she had completed her previous Oxycodone 10/325mg  and discontinue the Keflex and I had ordered Clindamycin e-scribe.  Judeen Hammans states pt has a 30 days of Oxycodone 10/325.  01/30/2016-Post op courtesy call-Left message informing pt not to weight bear or dangle the foot more than 15 mins/hour, remain in the boot at all times and dressing until it is changed at the 1st POV, call with concerns.

## 2016-02-01 ENCOUNTER — Encounter: Payer: Self-pay | Admitting: Podiatry

## 2016-02-01 ENCOUNTER — Ambulatory Visit (INDEPENDENT_AMBULATORY_CARE_PROVIDER_SITE_OTHER): Payer: Medicare Other

## 2016-02-01 ENCOUNTER — Ambulatory Visit (INDEPENDENT_AMBULATORY_CARE_PROVIDER_SITE_OTHER): Payer: Medicare Other | Admitting: Podiatry

## 2016-02-01 DIAGNOSIS — Z9889 Other specified postprocedural states: Secondary | ICD-10-CM

## 2016-02-01 DIAGNOSIS — M7750 Other enthesopathy of unspecified foot: Secondary | ICD-10-CM | POA: Diagnosis not present

## 2016-02-01 DIAGNOSIS — T847XXS Infection and inflammatory reaction due to other internal orthopedic prosthetic devices, implants and grafts, sequela: Secondary | ICD-10-CM

## 2016-02-01 NOTE — Progress Notes (Signed)
She presents today for a postop visit regarding removal of internal fixation third toe  right foot. He states this is the first foot surgery they really didn't hurt. She denies fever chills nausea vomiting muscle aches and pains.  Objective: Vital signs are stable she is alert and oriented 3. No erythema cellulitis drainage or odor dry sterile dressing removed demonstrates sutures are intact of ends of the toes. Radiographs confirm complete arthrodesis with removal of internal fixation third toe left foot.  Assessment: Well-healed surgical foot left.  Plan: He redressed today with a Band-Aid follow-up with her in 1 week for suture removal.

## 2016-02-08 ENCOUNTER — Ambulatory Visit (INDEPENDENT_AMBULATORY_CARE_PROVIDER_SITE_OTHER): Payer: Medicare Other | Admitting: Podiatry

## 2016-02-08 ENCOUNTER — Encounter: Payer: Self-pay | Admitting: Podiatry

## 2016-02-08 VITALS — BP 133/76 | HR 76 | Resp 16

## 2016-02-08 DIAGNOSIS — T847XXS Infection and inflammatory reaction due to other internal orthopedic prosthetic devices, implants and grafts, sequela: Secondary | ICD-10-CM

## 2016-02-08 DIAGNOSIS — Z9889 Other specified postprocedural states: Secondary | ICD-10-CM

## 2016-02-08 NOTE — Progress Notes (Signed)
She presents today 2 weeks status post removal screw second digit right foot. He is doing well.  Objective: Vital signs are stable alert and oriented 3. Pulses are palpable. Sutures are intact. Once removed the suture line remains intact with no dehiscence.  Assessment: Well-healing surgical toe third right.  Plan: Follow up with me on an as-needed basis for repair of second toe.

## 2016-04-19 NOTE — Progress Notes (Signed)
DOS 01/27/2016 screw removal 3rd toe right foot

## 2016-04-29 DIAGNOSIS — A09 Infectious gastroenteritis and colitis, unspecified: Secondary | ICD-10-CM

## 2016-04-29 DIAGNOSIS — Z79891 Long term (current) use of opiate analgesic: Secondary | ICD-10-CM | POA: Insufficient documentation

## 2016-04-29 DIAGNOSIS — R1110 Vomiting, unspecified: Secondary | ICD-10-CM | POA: Insufficient documentation

## 2016-04-29 DIAGNOSIS — E860 Dehydration: Secondary | ICD-10-CM

## 2016-04-29 DIAGNOSIS — Z7960 Long term (current) use of unspecified immunomodulators and immunosuppressants: Secondary | ICD-10-CM | POA: Insufficient documentation

## 2016-04-29 DIAGNOSIS — Z79899 Other long term (current) drug therapy: Secondary | ICD-10-CM | POA: Insufficient documentation

## 2016-04-29 DIAGNOSIS — K529 Noninfective gastroenteritis and colitis, unspecified: Secondary | ICD-10-CM | POA: Insufficient documentation

## 2016-04-29 HISTORY — DX: Dehydration: E86.0

## 2016-04-29 HISTORY — DX: Infectious gastroenteritis and colitis, unspecified: A09

## 2016-11-12 ENCOUNTER — Ambulatory Visit (INDEPENDENT_AMBULATORY_CARE_PROVIDER_SITE_OTHER): Payer: Medicare Other | Admitting: General Surgery

## 2016-11-12 ENCOUNTER — Encounter: Payer: Self-pay | Admitting: General Surgery

## 2016-11-12 VITALS — BP 116/70 | HR 72 | Resp 12 | Ht 69.0 in | Wt 167.0 lb

## 2016-11-12 DIAGNOSIS — R197 Diarrhea, unspecified: Secondary | ICD-10-CM | POA: Diagnosis not present

## 2016-11-12 DIAGNOSIS — R1084 Generalized abdominal pain: Secondary | ICD-10-CM | POA: Diagnosis not present

## 2016-11-12 DIAGNOSIS — K219 Gastro-esophageal reflux disease without esophagitis: Secondary | ICD-10-CM | POA: Insufficient documentation

## 2016-11-12 DIAGNOSIS — R109 Unspecified abdominal pain: Secondary | ICD-10-CM | POA: Insufficient documentation

## 2016-11-12 DIAGNOSIS — R634 Abnormal weight loss: Secondary | ICD-10-CM | POA: Diagnosis not present

## 2016-11-12 HISTORY — DX: Diarrhea, unspecified: R19.7

## 2016-11-12 HISTORY — DX: Generalized abdominal pain: R10.84

## 2016-11-12 HISTORY — DX: Abnormal weight loss: R63.4

## 2016-11-12 MED ORDER — POLYETHYLENE GLYCOL 3350 17 GM/SCOOP PO POWD: 1.0000 | Freq: Once | ORAL | 0 refills | Status: DC

## 2016-11-12 NOTE — Progress Notes (Signed)
Patient ID: Abigail [name] [facility], female   DOB: 11-26-1965, 51 y.o.   MRN: [id number]  Chief Complaint  Patient presents with  . Other    endoscopy    HPI Bassett is a 51 y.o. female here for evaluation for an upper endoscopy and colonoscopy.Last colonoscopy was in 2007. Patient states she has had nausea and vomiting with diarrhea seen August 2017.  The worst of her diarrhea was having eight to ten bowel movements daily.  No vomiting in the last nine days. Patient has lost 70 pounds since August, 2017. Moves her bowels daily,she use Mirlax as needed. In September, 2017 she was diagnosed  with H-pylori and had C Diff. Patient had a Ct scan on 04/29/2016. Ultrasound done 07/2017.   The patient reports that she has had long-standing reflux which is essentially stable. She developed nausea and vomiting in August and this was associated with diarrhea. She reported having 1 loose watery stool per day. Somewhere during this time frame she reports that she was diagnosed with both H. pylori and C. difficile. I have incomplete records in regards to her previous treatment.  Last known colonoscopy was in 2007 completed by Lucilla Lame, MD.  the exam was completed for hematochezia. Normal except for small internal hemorrhoids.   Her pain medication is perscribed  Gerald Leitz PA, who is with [facility] in [place].     HPI   Past Medical History:  Diagnosis Date  . Anxiety   . Arthritis   . Back pain   . Fibromyalgia   . H. pylori infection   . Hyperlipidemia   . IBS (irritable bowel syndrome)   . Migraines   . Muscle pain   . Reflux     Past Surgical History:  Procedure Laterality Date  . ABDOMINAL HYSTERECTOMY    . APPENDECTOMY  2009  . C5 FUSION    . C6 FUSION    . C7 FUSION    . COLONOSCOPY  02/2006  . FOOT SURGERY    . HIP ARTHROPLASTY    . L4 FUSION    . L5 FUSION    . S1 FUSION      No family history on file.  Social History Social History   Substance Use Topics  . Smoking status: Current Every Day Smoker    Packs/day: 1.00    Types: Cigarettes  . Smokeless tobacco: Current User     Comment: ELECTRONIC VAPOR  . Alcohol use Yes     Comment: OCCASIONALLY    Allergies  Allergen Reactions  . Codeine   . Darvocet [Propoxyphene N-Acetaminophen]   . Sulfa Antibiotics   . Tape     Current Outpatient Prescriptions  Medication Sig Dispense Refill  . Biotin 5000 MCG TABS Take by mouth.    . DULoxetine (CYMBALTA) 60 MG capsule Take 60 mg by mouth 2 (two) times daily.    Marland Kitchen estradiol (ESTRACE) 2 MG tablet Take 2 mg by mouth daily.    . folic acid (FOLVITE) 1 MG tablet Take 1 mg by mouth daily.    Marland Kitchen gabapentin (NEURONTIN) 600 MG tablet Take 600 mg by mouth 3 (three) times daily.    Marland Kitchen levothyroxine (SYNTHROID, LEVOTHROID) 75 MCG tablet Take 75 mcg by mouth daily before breakfast.    . methotrexate (RHEUMATREX) 2.5 MG tablet Take 7.5 mg by mouth 3 (three) times a week.    . morphine (MS CONTIN) 15 MG 12 hr tablet Take 15 mg by mouth  4 (four) times daily as needed.     . pantoprazole (PROTONIX) 40 MG tablet Take 40 mg by mouth daily.    . ranitidine (ZANTAC) 300 MG capsule Take 300 mg by mouth every evening.    . simvastatin (ZOCOR) 20 MG tablet Take 20 mg by mouth daily.    . polyethylene glycol powder (GLYCOLAX/MIRALAX) powder Take 255 g by mouth once. 255 grams one bottle for colonoscopy prep 255 g 0   No current facility-administered medications for this visit.     Review of Systems Review of Systems  Constitutional: Negative.   Respiratory: Negative.   Cardiovascular: Negative.     Blood pressure 116/70, pulse 72, resp. rate 12, height 5\' 9"  (1.753 m), weight 167 lb (75.8 kg).  Physical Exam Physical Exam  Constitutional: She is oriented to person, place, and time. She appears well-developed and well-nourished.  Eyes: Conjunctivae are normal. No scleral icterus.  Neck: Neck supple.  Cardiovascular: Normal rate,  regular rhythm and normal heart sounds.   Pulmonary/Chest: Effort normal and breath sounds normal.  Lymphadenopathy:    She has no cervical adenopathy.  Neurological: She is alert and oriented to person, place, and time.  Skin: Skin is warm and dry.    Data Reviewed 04/29/2016 CT of the abdomen with IV contrast only completed at [facility]:  -- Pancolitis, possibly pseudomembranous colitis. No evidence of bowel obstruction, organized fluid collections, or perforation. -- Splenomegaly.  Result Narrative  EXAM: CT abdomen and pelvis with contrast DATE: 04/29/2016 8:18 PM ACCESSION: [id number] UN DICTATED: 04/29/2016 8:31 PM INTERPRETATION LOCATION: [place]: 51 years old Female with ABDOMINAL PAIN, (specify site in comments)-diffuse-  COMPARISON: None  TECHNIQUE: A spiral CT scan was obtained with IV contrast from the lung bases to the pubic symphysis.Images were reconstructed in the axial plane. Coronal and sagittal reformatted images were also provided for further evaluation.  FINDINGS:   LINES/DEVICES: Anterior spinal fixation hardware spanning L4-L5 and L5-S1. Partially imaged right total hip arthroplasty without evidence of acute complication.  LOWER CHEST: Bibasilar atelectasis. No pleural effusions.  ABDOMEN/PELVIS:  HEPATOBILIARY: Diffuse hepatic steatosis. No biliary ductal dilatation.  GALLBLADDER: Unremarkable. PANCREAS: The pancreas is atrophic. No focal lesions or pancreatic biliary ductal dilation. SPLEEN: Small splenule (2:22).The spleen measures 13.9 cm. ADRENAL GLANDS: Unremarkable.  KIDNEYS/URETERS: Unremarkable. BLADDER: Unremarkable.  BOWEL/PERITONEUM/RETROPERITONEUM: Surgical clips in the right lower quadrant. The colon appears decompressed, with diffuse mucosal/serosal enhancement and wall thickening of the majority of the colon and rectum. Mild pericolonic stranding and trace pericolonic fluid (2:83, 70). Small bowel is  unremarkable. No evidence of bowel obstruction.  REPRODUCTIVE ORGANS: Unremarkable.  VASCULATURE: Atherosclerotic calcification of the aorta and its branches. Portal, superior mesenteric, and splenic veins are patent.  LYMPH NODES: Prominent peripancreatic lymph nodes measuring up to 0.9 cm (2:27), likely reactive. No abdominopelvic or retroperitoneal adenopathy by size criteria.  BONES/SOFT TISSUES: Degenerative disease of the visualized spine. No acute osseous abnormalities.  These images were reviewed. The colon is essentially collapsed, with no distention or area of her colonic inflammation.  GI clinic notes of Lyla Glassing, NP from [facility] reviewed.   Recently completed abdominal ultrasound through her primary care office dated 10/31/2016 showed no evidence of gallstones. The spleen was reported to be of normal size (see above).    Assessment    Unexplained weight loss, history of diarrhea, possible history C. difficile based on patient report. Long-standing GE reflux.    Plan    Indications for upper and lower  endoscopy reviewed. Risks of the procedure discussed.   Colonoscopy with possible biopsy/polypectomy prn: Information regarding the procedure, including its potential risks and complications (including but not limited to perforation of the bowel, which may require emergency surgery to repair, and bleeding) was verbally given to the patient. Educational information regarding lower intestinal endoscopy was given to the patient. Written instructions for how to complete the bowel prep using Miralax were provided. The importance of drinking ample fluids to avoid dehydration as a result of the prep emphasized.  Patient has been scheduled for an upper and lower endoscopy on 11-27-16 at [facility].   This information has been scribed by Gaspar Cola CMA.   Robert Bellow 11/12/2016, 3:26 PM

## 2016-11-12 NOTE — Patient Instructions (Signed)
Colonoscopy, Adult A colonoscopy is an exam to look at the large intestine. It is done to check for problems, such as:  Lumps (tumors).  Growths (polyps).  Swelling (inflammation).  Bleeding. What happens before the procedure? Eating and drinking  Follow instructions from your doctor about eating and drinking. These instructions may include:  A few days before the procedure - follow a low-fiber diet.  Avoid nuts.  Avoid seeds.  Avoid dried fruit.  Avoid raw fruits.  Avoid vegetables.  1-3 days before the procedure - follow a clear liquid diet. Avoid liquids that have red or purple dye. Drink only clear liquids, such as:  Clear broth or bouillon.  Black coffee or tea.  Clear juice.  Clear soft drinks or sports drinks.  Gelatin dessert.  Popsicles.  On the day of the procedure - do not eat or drink anything during the 2 hours before the procedure. Bowel prep  If you were prescribed an oral bowel prep:  Take it as told by your doctor. Starting the day before your procedure, you will need to drink a lot of liquid. The liquid will cause you to poop (have bowel movements) until your poop is almost clear or light green.  If your skin or butt gets irritated from diarrhea, you may:  Wipe the area with wipes that have medicine in them, such as adult wet wipes with aloe and vitamin E.  Put something on your skin that soothes the area, such as petroleum jelly.  If you throw up (vomit) while drinking the bowel prep, take a break for up to 60 minutes. Then begin the bowel prep again. If you keep throwing up and you cannot take the bowel prep without throwing up, call your doctor. General instructions   Ask your doctor about changing or stopping your normal medicines. This is important if you take diabetes medicines or blood thinners.  Plan to have someone take you home from the hospital or clinic. What happens during the procedure?  An IV tube may be put into one of your  veins.  You will be given medicine to help you relax (sedative).  To reduce your risk of infection:  Your doctors will wash their hands.  Your anal area will be washed with soap.  You will be asked to lie on your side with your knees bent.  Your doctor will get a long, thin, flexible tube ready. The tube will have a camera and a light on the end.  The tube will be put into your anus.  The tube will be gently put into your large intestine.  Air will be delivered into your large intestine to keep it open. You may feel some pressure or cramping.  The camera will be used to take photos.  A small tissue sample may be removed from your body to be looked at under a microscope (biopsy). If any possible problems are found, the tissue will be sent to a lab for testing.  If small growths are found, your doctor may remove them and have them checked for cancer.  The tube that was put into your anus will be slowly removed. The procedure may vary among doctors and hospitals. What happens after the procedure?  Your doctor will check on you often until the medicines you were given have worn off.  Do not drive for 24 hours after the procedure.  You may have a small amount of blood in your poop.  You may pass gas.  You may  have mild cramps or bloating in your belly (abdomen).  It is up to you to get the results of your procedure. Ask your doctor, or the department performing the procedure, when your results will be ready. This information is not intended to replace advice given to you by your health care provider. Make sure you discuss any questions you have with your health care provider. Document Released: 09/08/2010 Document Revised: 06/06/2016 Document Reviewed: 10/18/2015 Elsevier Interactive Patient Education  2017 [place] Upper Endoscopy Upper endoscopy is a procedure to look inside the upper GI (gastrointestinal) tract. The upper GI tract is made up of:  The tube that  carries food and liquid from your throat to your stomach (esophagus).  The stomach.  The first part of your small intestine (duodenum). This procedure is also called esophagogastroduodenoscopy (EGD) or gastroscopy. In this procedure, your health care provider passes a thin, flexible tube (endoscope) through your mouth and down your esophagus into your stomach. A small camera is attached to the end of the tube. Images from the camera appear on a monitor in the exam room. During this procedure, your health care provider may also remove a small piece of tissue to be sent to a lab and examined under a microscope (biopsy). Your health care provider may do an upper endoscopy to diagnose cancers of the upper GI tract. You may also have this procedure to find the cause of other conditions, such as:  Stomach pain.  Heartburn.  Pain or problems when swallowing.  Nausea and vomiting.  Stomach bleeding.  Stomach ulcers. Tell a health care provider about:  Any allergies you have.  All medicines you are taking, including vitamins, herbs, eye drops, creams, and over-the-counter medicines.  Any problems you or family members have had with anesthetic medicines.  Any blood disorders you have.  Any surgeries you have had.  Any medical conditions you have.  Whether you are pregnant or may be pregnant. What are the risks? Generally, this is a safe procedure. However, problems may occur, including:  Infection.  Bleeding.  Allergic reactions to medicines.  A tear or hole (perforation) in the esophagus, stomach, or duodenum. What happens before the procedure?  Follow instructions from your health care provider about eating or drinking restrictions.  Ask your health care provider about changing or stopping your regular medicines. This is especially important if you are taking diabetes medicines or blood thinners.  Plan to have someone take you home after the procedure.  If you go home  right after the procedure, plan to have someone with you for 24 hours. What happens during the procedure?  An IV tube will be inserted into one of your veins.  Your throat may be sprayed with medicine that numbs the area (local anesthetic).  You may be given a medicine to help you relax (sedative).  You will lie on your left side.  Your health care provider will pass the endoscope through your mouth and down your esophagus.  Your provider will use the scope to check the inside of your esophagus, stomach, and duodenum. Biopsies may be taken. The procedure may vary among health care providers and hospitals. What happens after the procedure?  Do not drive for 24 hours if you received a sedative.  Your blood pressure, heart rate, breathing rate, and blood oxygen level will be monitored often until the medicines you were given have worn off.  When your throat is no longer numb, you may be given some fluids to drink.  It is your responsibility to get the results of your procedure. Ask your health care provider or the department performing the procedure when your results will be ready. This information is not intended to replace advice given to you by your health care provider. Make sure you discuss any questions you have with your health care provider. Document Released: 08/03/2000 Document Revised: 01/17/2016 Document Reviewed: 05/19/2015 Elsevier Interactive Patient Education  2017 [employer].

## 2016-11-26 ENCOUNTER — Telehealth: Payer: Self-pay | Admitting: *Deleted

## 2016-11-26 NOTE — Telephone Encounter (Signed)
Patient called the office wanting to reschedule her upper and lower endoscopy due to neck pain that she has been experiencing.   This has been rescheduled from 11-27-16 to 12-25-16 at [facility].   Trish in endoscopy has been notified of date change.

## 2016-11-28 ENCOUNTER — Telehealth: Payer: Self-pay | Admitting: *Deleted

## 2016-11-28 NOTE — Telephone Encounter (Signed)
Patient called the office today wanting to reschedule her upper and lower endoscopy to a morning procedure.   This patient states she takes morphine 15 mg by mouth 4 times daily.   Per Dr. Bary Castilla, patient would need to be an afternoon case to allow her to take her usual morning dose of morphine. She would hold all other doses until her scheduled afternoon procedure.   Patient verbalizes understanding and wishes to keep procedure as scheduled for 12-25-16.

## 2016-12-12 ENCOUNTER — Other Ambulatory Visit: Payer: Self-pay | Admitting: General Surgery

## 2016-12-17 ENCOUNTER — Telehealth: Payer: Self-pay | Admitting: *Deleted

## 2016-12-17 NOTE — Telephone Encounter (Signed)
Message for patient to call the office.   Patient is scheduled for an upper and lower endoscopy for 12-25-16 at [facility] with Dr. Bary Castilla.   We need to confirm that patient has had no medication changes since her last office visit. Also, confirm that patient has picked up the Miralax prescription.

## 2016-12-20 NOTE — Telephone Encounter (Signed)
Patient called the office back to report no change in medications since her last office visit. She reports that she has picked up her Miralax perscription. This patient was reminded about the clear liquid prep the day before. She is aware to call on Monday between 1 and 3 pm to get her arrival time for colonoscopy on Tuesday.  We will proceed with colonoscopy as scheduled for next week.   Patient was instructed to call the office should she have further questions.

## 2016-12-25 ENCOUNTER — Encounter: Payer: Self-pay | Admitting: Anesthesiology

## 2016-12-25 ENCOUNTER — Encounter
Admission: RE | Disposition: A | Discharge status: Home / Self Care | Payer: Self-pay | Source: Ambulatory Visit | Attending: General Surgery

## 2016-12-25 ENCOUNTER — Ambulatory Visit: Payer: Medicare Other | Admitting: Certified Registered Nurse Anesthetist

## 2016-12-25 ENCOUNTER — Ambulatory Visit
Admission: RE | Admit: 2016-12-25 | Discharge: 2016-12-25 | Disposition: A | Discharge status: Home / Self Care | Payer: Medicare Other | Source: Ambulatory Visit | Attending: General Surgery | Admitting: General Surgery

## 2016-12-25 DIAGNOSIS — K319 Disease of stomach and duodenum, unspecified: Secondary | ICD-10-CM | POA: Diagnosis not present

## 2016-12-25 DIAGNOSIS — D127 Benign neoplasm of rectosigmoid junction: Secondary | ICD-10-CM | POA: Diagnosis not present

## 2016-12-25 DIAGNOSIS — F419 Anxiety disorder, unspecified: Secondary | ICD-10-CM | POA: Insufficient documentation

## 2016-12-25 DIAGNOSIS — Z981 Arthrodesis status: Secondary | ICD-10-CM | POA: Insufficient documentation

## 2016-12-25 DIAGNOSIS — Z79899 Other long term (current) drug therapy: Secondary | ICD-10-CM | POA: Diagnosis not present

## 2016-12-25 DIAGNOSIS — Z6824 Body mass index (BMI) 24.0-24.9, adult: Secondary | ICD-10-CM | POA: Insufficient documentation

## 2016-12-25 DIAGNOSIS — K449 Diaphragmatic hernia without obstruction or gangrene: Secondary | ICD-10-CM | POA: Diagnosis not present

## 2016-12-25 DIAGNOSIS — K6389 Other specified diseases of intestine: Secondary | ICD-10-CM

## 2016-12-25 DIAGNOSIS — K635 Polyp of colon: Secondary | ICD-10-CM | POA: Insufficient documentation

## 2016-12-25 DIAGNOSIS — K529 Noninfective gastroenteritis and colitis, unspecified: Secondary | ICD-10-CM | POA: Insufficient documentation

## 2016-12-25 DIAGNOSIS — F17210 Nicotine dependence, cigarettes, uncomplicated: Secondary | ICD-10-CM | POA: Insufficient documentation

## 2016-12-25 DIAGNOSIS — K219 Gastro-esophageal reflux disease without esophagitis: Secondary | ICD-10-CM | POA: Diagnosis not present

## 2016-12-25 DIAGNOSIS — Z79890 Hormone replacement therapy (postmenopausal): Secondary | ICD-10-CM | POA: Insufficient documentation

## 2016-12-25 DIAGNOSIS — R634 Abnormal weight loss: Secondary | ICD-10-CM

## 2016-12-25 DIAGNOSIS — J449 Chronic obstructive pulmonary disease, unspecified: Secondary | ICD-10-CM | POA: Diagnosis not present

## 2016-12-25 DIAGNOSIS — E785 Hyperlipidemia, unspecified: Secondary | ICD-10-CM | POA: Diagnosis not present

## 2016-12-25 DIAGNOSIS — R197 Diarrhea, unspecified: Secondary | ICD-10-CM

## 2016-12-25 DIAGNOSIS — K2950 Unspecified chronic gastritis without bleeding: Secondary | ICD-10-CM | POA: Diagnosis not present

## 2016-12-25 HISTORY — PX: ESOPHAGOGASTRODUODENOSCOPY (EGD) WITH PROPOFOL: SHX5813

## 2016-12-25 HISTORY — PX: COLONOSCOPY WITH PROPOFOL: SHX5780

## 2016-12-25 SURGERY — ESOPHAGOGASTRODUODENOSCOPY (EGD) WITH PROPOFOL
Anesthesia: General

## 2016-12-25 MED ORDER — LIDOCAINE HCL (CARDIAC) 20 MG/ML IV SOLN
INTRAVENOUS | Status: DC | PRN
  Administered 2016-12-25: 100 mg via INTRAVENOUS

## 2016-12-25 MED ORDER — MIDAZOLAM HCL 2 MG/2ML IJ SOLN
INTRAMUSCULAR | Status: AC
  Filled 2016-12-25: qty 2

## 2016-12-25 MED ORDER — PROPOFOL 500 MG/50ML IV EMUL
INTRAVENOUS | Status: AC
  Filled 2016-12-25: qty 50

## 2016-12-25 MED ORDER — MIDAZOLAM HCL 5 MG/5ML IJ SOLN
INTRAMUSCULAR | Status: DC | PRN
  Administered 2016-12-25: 2 mg via INTRAVENOUS

## 2016-12-25 MED ORDER — GLYCOPYRROLATE 0.2 MG/ML IJ SOLN
INTRAMUSCULAR | Status: DC | PRN
  Administered 2016-12-25: .2 mg via INTRAVENOUS

## 2016-12-25 MED ORDER — PROPOFOL 500 MG/50ML IV EMUL
INTRAVENOUS | Status: DC | PRN
  Administered 2016-12-25: 140 ug/kg/min via INTRAVENOUS

## 2016-12-25 MED ORDER — FENTANYL CITRATE (PF) 100 MCG/2ML IJ SOLN
INTRAMUSCULAR | Status: AC
  Filled 2016-12-25: qty 2

## 2016-12-25 MED ORDER — IPRATROPIUM-ALBUTEROL 0.5-2.5 (3) MG/3ML IN SOLN
3.0000 mL | Freq: Once | RESPIRATORY_TRACT | Status: AC
  Administered 2016-12-25: 3 mL via RESPIRATORY_TRACT
  Filled 2016-12-25: qty 3

## 2016-12-25 MED ORDER — SODIUM CHLORIDE 0.9 % IV SOLN
INTRAVENOUS | Status: DC
  Administered 2016-12-25: 1000 mL via INTRAVENOUS

## 2016-12-25 MED ORDER — PROPOFOL 10 MG/ML IV BOLUS
INTRAVENOUS | Status: DC | PRN
  Administered 2016-12-25: 40 mg via INTRAVENOUS

## 2016-12-25 MED ORDER — FENTANYL CITRATE (PF) 100 MCG/2ML IJ SOLN
INTRAMUSCULAR | Status: DC | PRN
  Administered 2016-12-25: 100 ug via INTRAVENOUS

## 2016-12-25 NOTE — Transfer of Care (Signed)
Immediate Anesthesia Transfer of Care Note  Patient: Abigail [name]  Procedure(s) Performed: Procedure(s): ESOPHAGOGASTRODUODENOSCOPY (EGD) WITH PROPOFOL (N/A) COLONOSCOPY WITH PROPOFOL (N/A)  Patient Location: PACU and Endoscopy Unit  Anesthesia Type:General  Level of Consciousness: awake, alert  and oriented  Airway & Oxygen Therapy: Patient Spontanous Breathing and Patient connected to nasal cannula oxygen  Post-op Assessment: Report given to RN  Post vital signs: Reviewed and stable  Last Vitals:  Vitals:   12/25/16 1422 12/25/16 1423  BP:    Pulse:    Resp:    Temp: 36.2 C 36.2 C    Last Pain:  Vitals:   12/25/16 1423  TempSrc: Tympanic  PainSc:          Complications: No apparent anesthesia complications

## 2016-12-25 NOTE — H&P (Signed)
Abigail [name] [facility] [id number] 24-Feb-1966     HPI: [place] is a 51 y.o. female here for evaluation for an upper endoscopy and colonoscopy.Last colonoscopy was in 2007. Patient states she has had nausea and vomiting with diarrhea seen August 2017. No change in clinical history or exam.    Prescriptions Prior to Admission  Medication Sig Dispense Refill Last Dose  . Biotin 5000 MCG TABS Take by mouth.   12/24/2016 at Unknown time  . Cholecalciferol (VITAMIN D3) 10000 units TABS Take 10,000 Units/day by mouth daily.   12/24/2016 at Unknown time  . DULoxetine (CYMBALTA) 60 MG capsule Take 60 mg by mouth 2 (two) times daily.   12/24/2016 at Unknown time  . estradiol (ESTRACE) 2 MG tablet Take 2 mg by mouth daily.   Past Week at Unknown time  . folic acid (FOLVITE) 1 MG tablet Take 1 mg by mouth daily.   12/24/2016 at Unknown time  . gabapentin (NEURONTIN) 600 MG tablet Take 600 mg by mouth 3 (three) times daily.   12/24/2016 at Unknown time  . levothyroxine (SYNTHROID, LEVOTHROID) 75 MCG tablet Take 75 mcg by mouth daily before breakfast.   12/24/2016 at Unknown time  . methotrexate (RHEUMATREX) 2.5 MG tablet Take 7.5 mg by mouth 3 (three) times a week.   Past Week at Unknown time  . morphine (MS CONTIN) 15 MG 12 hr tablet Take 15 mg by mouth 4 (four) times daily as needed.    12/25/2016 at 0800  . pantoprazole (PROTONIX) 40 MG tablet Take 40 mg by mouth daily.   12/24/2016 at Unknown time  . ranitidine (ZANTAC) 300 MG capsule Take 300 mg by mouth every evening.   12/24/2016 at Unknown time  . simvastatin (ZOCOR) 20 MG tablet Take 20 mg by mouth daily.   Past Week at Unknown time  . polyethylene glycol powder (GLYCOLAX/MIRALAX) powder TAKE 255 GRAMS (1 BOTTLE) BY MOUTH FOR COLONOSCOPY PREP 255 g 0    Allergies  Allergen Reactions  . Codeine   . Darvocet [Propoxyphene N-Acetaminophen]   . Sulfa Antibiotics   . Tape    Past Medical History:  Diagnosis Date  . Anxiety   . Arthritis    . Back pain   . Fibromyalgia   . H. pylori infection   . Hyperlipidemia   . IBS (irritable bowel syndrome)   . Migraines   . Muscle pain   . Reflux    Past Surgical History:  Procedure Laterality Date  . ABDOMINAL HYSTERECTOMY    . APPENDECTOMY  2009  . C5 FUSION    . C6 FUSION    . C7 FUSION    . COLONOSCOPY  02/2006  . FOOT SURGERY    . HIP ARTHROPLASTY    . L4 FUSION    . L5 FUSION    . S1 FUSION     Social History   Social History  . Marital status: Divorced    Spouse name: N/A  . Number of children: N/A  . Years of education: N/A   Occupational History  . Not on file.   Social History Main Topics  . Smoking status: Current Every Day Smoker    Packs/day: 1.00    Types: Cigarettes  . Smokeless tobacco: Never Used     Comment: ELECTRONIC VAPOR  . Alcohol use Yes     Comment: OCCASIONALLY  . Drug use: No  . Sexual activity: Not on file   Other Topics Concern  .  Not on file   Social History Narrative  . No narrative on file   Social History   Social History Narrative  . No narrative on file     ROS: Negative.     PE: HEENT: Negative. Lungs: Clear. Cardio: RR.  Robert Bellow 12/25/2016   Assessment/Plan:  Proceed with planned endoscopy.

## 2016-12-25 NOTE — Anesthesia Preprocedure Evaluation (Signed)
Anesthesia Evaluation  Patient identified by MRN, date of birth, ID band Patient awake    Reviewed: Allergy & Precautions, H&P , NPO status , Patient's Chart, lab work & pertinent test results  History of Anesthesia Complications Negative for: history of anesthetic complications  Airway Mallampati: III  TM Distance: <3 FB Neck ROM: limited    Dental  (+) Poor Dentition, Chipped, Caps   Pulmonary neg shortness of breath, COPD, Current Smoker,    Pulmonary exam normal breath sounds clear to auscultation       Cardiovascular Exercise Tolerance: Good (-) angina(-) Past MI and (-) DOE negative cardio ROS Normal cardiovascular exam Rhythm:regular Rate:Normal     Neuro/Psych  Headaches,  Neuromuscular disease negative psych ROS   GI/Hepatic Neg liver ROS, GERD  Medicated and Controlled,  Endo/Other  negative endocrine ROS  Renal/GU negative Renal ROS  negative genitourinary   Musculoskeletal  (+) Arthritis , Fibromyalgia -  Abdominal   Peds  Hematology negative hematology ROS (+)   Anesthesia Other Findings Past Medical History: No date: Anxiety No date: Arthritis No date: Back pain No date: Fibromyalgia No date: H. pylori infection No date: Hyperlipidemia No date: IBS (irritable bowel syndrome) No date: Migraines No date: Muscle pain No date: Reflux  Past Surgical History: No date: ABDOMINAL HYSTERECTOMY 2009: APPENDECTOMY No date: C5 FUSION No date: C6 FUSION No date: C7 FUSION 02/2006: COLONOSCOPY No date: FOOT SURGERY No date: HIP ARTHROPLASTY No date: L4 FUSION No date: L5 FUSION No date: S1 FUSION     Reproductive/Obstetrics negative OB ROS                             Anesthesia Physical Anesthesia Plan  ASA: III  Anesthesia Plan: General   Post-op Pain Management:    Induction: Intravenous  Airway Management Planned: Natural Airway and Nasal  Cannula  Additional Equipment:   Intra-op Plan:   Post-operative Plan:   Informed Consent: I have reviewed the patients History and Physical, chart, labs and discussed the procedure including the risks, benefits and alternatives for the proposed anesthesia with the patient or authorized representative who has indicated his/her understanding and acceptance.   Dental Advisory Given  Plan Discussed with: Anesthesiologist, CRNA and Surgeon  Anesthesia Plan Comments: (Patient consented for risks of anesthesia including but not limited to:  - adverse reactions to medications - damage to teeth, lips or other oral mucosa - sore throat or hoarseness - Damage to heart, brain, lungs or loss of life  Patient voiced understanding.)        Anesthesia Quick Evaluation

## 2016-12-25 NOTE — Op Note (Signed)
[facility] Gastroenterology Patient Name: Abigail [name] Procedure Date: 12/25/2016 12:43 PM MRN: [id number] Account #: [id number] Date of Birth: 09/14/1965 Admit Type: Outpatient Age: 51 Room: [facility] ENDO ROOM 1 Gender: Female Note Status: Finalized Procedure:            Upper GI endoscopy Indications:          Weight loss Providers:            Robert Bellow, MD Referring MD:         Meindert A. Brunetta Genera, MD (Referring MD) Medicines:            Monitored Anesthesia Care Complications:        No immediate complications. Procedure:            Pre-Anesthesia Assessment:                       - Prior to the procedure, a History and Physical was                        performed, and patient medications, allergies and                        sensitivities were reviewed. The patient's tolerance of                        previous anesthesia was reviewed.                       - The risks and benefits of the procedure and the                        sedation options and risks were discussed with the                        patient. All questions were answered and informed                        consent was obtained.                       After obtaining informed consent, the endoscope was                        passed under direct vision. Throughout the procedure,                        the patient's blood pressure, pulse, and oxygen                        saturations were monitored continuously. The Endoscope                        was introduced through the mouth, and advanced to the                        third part of duodenum. The upper GI endoscopy was                        accomplished without difficulty. The patient tolerated  the procedure well. Findings:      The esophagus was normal. Biopsies at 37 cm completed based on past       history of hiatal hernia.      Patchy minimal inflammation was found in the gastric antrum. This was   biopsied with a cold forceps for histology.      The examined duodenum was normal. Impression:           - Normal esophagus.                       - Chronic gastritis. Biopsied.                       - Normal examined duodenum. Recommendation:       - Perform a colonoscopy today. Procedure Code(s):    --- Professional ---                       [phone], Esophagogastroduodenoscopy, flexible, transoral;                        with biopsy, single or multiple Diagnosis Code(s):    --- Professional ---                       K29.50, Unspecified chronic gastritis without bleeding                       R63.4, Abnormal weight loss CPT copyright 2016 American Medical Association. All rights reserved. The codes documented in this report are preliminary and upon coder review may  be revised to meet current compliance requirements. Robert Bellow, MD 12/25/2016 1:45:24 PM This report has been signed electronically. Number of Addenda: 0 Note Initiated On: 12/25/2016 12:43 PM      [facility]

## 2016-12-25 NOTE — H&P (Signed)
[place] [id number] 1966/08/06     HPI: Weight loss and unexplained diarrhea.  No other change in clinical history. Tolerated prep well.   Prescriptions Prior to Admission  Medication Sig Dispense Refill Last Dose  . Biotin 5000 MCG TABS Take by mouth.   12/24/2016 at Unknown time  . Cholecalciferol (VITAMIN D3) 10000 units TABS Take 10,000 Units/day by mouth daily.   12/24/2016 at Unknown time  . DULoxetine (CYMBALTA) 60 MG capsule Take 60 mg by mouth 2 (two) times daily.   12/24/2016 at Unknown time  . estradiol (ESTRACE) 2 MG tablet Take 2 mg by mouth daily.   Past Week at Unknown time  . folic acid (FOLVITE) 1 MG tablet Take 1 mg by mouth daily.   12/24/2016 at Unknown time  . gabapentin (NEURONTIN) 600 MG tablet Take 600 mg by mouth 3 (three) times daily.   12/24/2016 at Unknown time  . levothyroxine (SYNTHROID, LEVOTHROID) 75 MCG tablet Take 75 mcg by mouth daily before breakfast.   12/24/2016 at Unknown time  . methotrexate (RHEUMATREX) 2.5 MG tablet Take 7.5 mg by mouth 3 (three) times a week.   Past Week at Unknown time  . morphine (MS CONTIN) 15 MG 12 hr tablet Take 15 mg by mouth 4 (four) times daily as needed.    12/25/2016 at 0800  . pantoprazole (PROTONIX) 40 MG tablet Take 40 mg by mouth daily.   12/24/2016 at Unknown time  . ranitidine (ZANTAC) 300 MG capsule Take 300 mg by mouth every evening.   12/24/2016 at Unknown time  . simvastatin (ZOCOR) 20 MG tablet Take 20 mg by mouth daily.   Past Week at Unknown time  . polyethylene glycol powder (GLYCOLAX/MIRALAX) powder TAKE 255 GRAMS (1 BOTTLE) BY MOUTH FOR COLONOSCOPY PREP 255 g 0    Allergies  Allergen Reactions  . Codeine   . Darvocet [Propoxyphene N-Acetaminophen]   . Sulfa Antibiotics   . Tape    Past Medical History:  Diagnosis Date  . Anxiety   . Arthritis   . Back pain   . Fibromyalgia   . H. pylori infection   . Hyperlipidemia   . IBS (irritable bowel syndrome)   . Migraines   . Muscle pain   . Reflux     Past Surgical History:  Procedure Laterality Date  . ABDOMINAL HYSTERECTOMY    . APPENDECTOMY  2009  . C5 FUSION    . C6 FUSION    . C7 FUSION    . COLONOSCOPY  02/2006  . FOOT SURGERY    . HIP ARTHROPLASTY    . L4 FUSION    . L5 FUSION    . S1 FUSION     Social History   Social History  . Marital status: Divorced    Spouse name: N/A  . Number of children: N/A  . Years of education: N/A   Occupational History  . Not on file.   Social History Main Topics  . Smoking status: Current Every Day Smoker    Packs/day: 1.00    Types: Cigarettes  . Smokeless tobacco: Never Used     Comment: ELECTRONIC VAPOR  . Alcohol use Yes     Comment: OCCASIONALLY  . Drug use: No  . Sexual activity: Not on file   Other Topics Concern  . Not on file   Social History Narrative  . No narrative on file   Social History   Social History Narrative  . No narrative on  file     ROS: Negative.     PE: HEENT: Negative. Lungs: Clear. Cardio: RR.  Assessment/Plan:  Proceed with planned upper and lower endoscopy.   Robert Bellow 12/25/2016

## 2016-12-25 NOTE — Anesthesia Postprocedure Evaluation (Signed)
Anesthesia Post Note  Patient: [place]  Procedure(s) Performed: Procedure(s) (LRB): ESOPHAGOGASTRODUODENOSCOPY (EGD) WITH PROPOFOL (N/A) COLONOSCOPY WITH PROPOFOL (N/A)  Patient location during evaluation: Endoscopy Anesthesia Type: General Level of consciousness: awake and alert Pain management: pain level controlled Vital Signs Assessment: post-procedure vital signs reviewed and stable Respiratory status: spontaneous breathing, nonlabored ventilation, respiratory function stable and patient connected to nasal cannula oxygen Cardiovascular status: blood pressure returned to baseline and stable Postop Assessment: no signs of nausea or vomiting Anesthetic complications: no     Last Vitals:  Vitals:   12/25/16 1442 12/25/16 1452  BP: 103/62 (!) 118/54  Pulse: 80 93  Resp: 11 15  Temp:      Last Pain:  Vitals:   12/25/16 1423  TempSrc: Tympanic  PainSc:                  Precious Haws Piscitello

## 2016-12-25 NOTE — Anesthesia Post-op Follow-up Note (Cosign Needed)
Anesthesia QCDR form completed.        

## 2016-12-25 NOTE — Op Note (Signed)
[facility] Gastroenterology Patient Name: Abigail [name] Procedure Date: 12/25/2016 12:43 PM MRN: [id number] Account #: [id number] Date of Birth: Oct 05, 1965 Admit Type: Outpatient Age: 51 Room: [facility] ENDO ROOM 1 Gender: Female Note Status: Finalized Procedure:            Colonoscopy Indications:          Chronic diarrhea Providers:            Robert Bellow, MD Medicines:            Monitored Anesthesia Care Complications:        No immediate complications. Procedure:            Pre-Anesthesia Assessment:                       - Prior to the procedure, a History and Physical was                        performed, and patient medications, allergies and                        sensitivities were reviewed. The patient's tolerance of                        previous anesthesia was reviewed.                       - The risks and benefits of the procedure and the                        sedation options and risks were discussed with the                        patient. All questions were answered and informed                        consent was obtained.                       After obtaining informed consent, the colonoscope was                        passed under direct vision. Throughout the procedure,                        the patient's blood pressure, pulse, and oxygen                        saturations were monitored continuously. The                        Colonoscope was introduced through the anus and                        advanced to the the cecum, identified by appendiceal                        orifice and ileocecal valve. The colonoscopy was                        performed without difficulty. The patient tolerated the  procedure well. The quality of the bowel preparation                        was excellent. Findings:      A 9 mm polyp was found in the recto-sigmoid colon. The polyp was       sessile. This was biopsied with a cold  forceps for histology.      The retroflexed view of the distal rectum and anal verge was normal and       showed no anal or rectal abnormalities.      A localized area of mildly congested mucosa was found in the distal       transverse colon. This was biopsied with a cold forceps for histology.      Random biopsies of the right and left colon were completed. Impression:           - One 9 mm polyp at the recto-sigmoid colon. Biopsied.                       - The distal rectum and anal verge are normal on                        retroflexion view.                       - Congested mucosa in the distal transverse colon.                        Biopsied. Recommendation:       - Telephone endoscopist for pathology results in 1 week. Procedure Code(s):    --- Professional ---                       [phone], Colonoscopy, flexible; with biopsy, single or                        multiple Diagnosis Code(s):    --- Professional ---                       K52.9, Noninfective gastroenteritis and colitis,                        unspecified                       K63.89, Other specified diseases of intestine                       D12.7, Benign neoplasm of rectosigmoid junction CPT copyright 2016 American Medical Association. All rights reserved. The codes documented in this report are preliminary and upon coder review may  be revised to meet current compliance requirements. Robert Bellow, MD 12/25/2016 2:20:00 PM This report has been signed electronically. Number of Addenda: 0 Note Initiated On: 12/25/2016 12:43 PM Scope Withdrawal Time: 0 hours 17 minutes 26 seconds  Total Procedure Duration: 0 hours 29 minutes 36 seconds       [facility]

## 2016-12-26 ENCOUNTER — Encounter: Payer: Self-pay | Admitting: General Surgery

## 2016-12-28 LAB — SURGICAL PATHOLOGY

## 2016-12-29 ENCOUNTER — Telehealth: Payer: Self-pay | Admitting: General Surgery

## 2016-12-29 NOTE — Telephone Encounter (Signed)
Notified pathology all good. No evidence of colitis or recurrent H. pylori infection. We'll arrange for a small bowel follow-through to assess the last site for intermittent diarrhea.

## 2016-12-31 ENCOUNTER — Telehealth: Payer: Self-pay

## 2016-12-31 ENCOUNTER — Other Ambulatory Visit: Payer: Self-pay

## 2016-12-31 DIAGNOSIS — K529 Noninfective gastroenteritis and colitis, unspecified: Secondary | ICD-10-CM

## 2016-12-31 NOTE — Telephone Encounter (Signed)
Call to patient to set up her Small Bowel Follow Thru. The patient is scheduled for her scan at [facility] on 01/03/17 at 8:00 am. She will arrive at 7:45 am. She will pick up a prep kit today. The patient is aware of date, time, and instructions.

## 2016-12-31 NOTE — Telephone Encounter (Signed)
-----   Message from Robert Bellow, MD sent at 12/29/2016 10:46 AM EDT ----- Please arrange for the patient have a small bowel follow-through, evaluation of recurrent diarrhea. Thanks.   ----- Message ----- From: Interface, Lab In Three Zero One Sent: 12/28/2016   3:27 PM To: Robert Bellow, MD

## 2017-01-03 ENCOUNTER — Ambulatory Visit: Admission: RE | Admit: 2017-01-03 | Payer: Medicare Other | Source: Ambulatory Visit

## 2017-01-07 ENCOUNTER — Ambulatory Visit
Admission: RE | Admit: 2017-01-07 | Discharge: 2017-01-07 | Disposition: A | Discharge status: Home / Self Care | Payer: Medicare Other | Source: Ambulatory Visit | Attending: General Surgery | Admitting: General Surgery

## 2017-01-07 DIAGNOSIS — K529 Noninfective gastroenteritis and colitis, unspecified: Secondary | ICD-10-CM | POA: Diagnosis present

## 2017-01-08 ENCOUNTER — Telehealth: Payer: Self-pay | Admitting: *Deleted

## 2017-01-08 ENCOUNTER — Ambulatory Visit: Admit: 2017-01-08 | Payer: Medicare Other | Admitting: Gastroenterology

## 2017-01-08 SURGERY — ESOPHAGOGASTRODUODENOSCOPY (EGD) WITH PROPOFOL
Anesthesia: General

## 2017-01-08 NOTE — Telephone Encounter (Signed)
Pt called to let Dr Bary Castilla know that she experienced sharpe right lower abdominal pain after the test yesterday that lasted about 8 hours before going away. She states that the pain started back again today after eating a slim jim around 1 pm. Denies any nausea or vomiting. She will monitor for now and call back tomorrow with an update. She also would like a RX for miralax, it cost less with a Rx.

## 2017-01-09 NOTE — Telephone Encounter (Signed)
Patient called back today with an update. She states that yesterday she had a total of 8 hours of right sided pain. She states that when she eats she gets a sharp pain in her right side that lasts several hours and slowly goes to a dull ache. She is asking about her results for her SBFU done on 01/07/17.

## 2017-01-11 NOTE — Telephone Encounter (Signed)
The patient's small bowel follow-through was normal. Upper and lower endoscopies did not identify a source for her diarrhea.  Ultrasound completed at her PCP office March 2018 was normal. We'll arrange for a HIDA scan with [facility] (hopefully she will not require repeat ultrasound) and follow-up from there.

## 2017-01-16 ENCOUNTER — Other Ambulatory Visit: Payer: Self-pay

## 2017-01-16 DIAGNOSIS — R1084 Generalized abdominal pain: Secondary | ICD-10-CM

## 2017-01-24 ENCOUNTER — Telehealth: Payer: Self-pay

## 2017-01-24 NOTE — Telephone Encounter (Signed)
Patient called and has been throwing up and having diarrhea since this morning. She states that she has had no fever but has had chills. No complaints of abdominal pain.  Patient advised to monitor condition and to drink small sips of fluids. She may try over the counter anti diarrheals. Advised if it got very bad during the night she could go to the ER, otherwise she is to call her primary care doctor to be seen for this. Patient is aware and amendable to this.

## 2017-01-29 NOTE — Telephone Encounter (Signed)
Message left for patient to call back to get information on HIDA scan scheduled for 02/08/17.

## 2017-02-01 NOTE — Telephone Encounter (Signed)
Spoke with patient about the HIDA scan and she is amendable to this. She is scheduled at [facility] on 02/08/17 at 10:00 am. She is to arrive by 9:45 am and have nothing to eat or drink for 4 hours prior. The patient is aware of date, time, and instructions.

## 2017-02-15 ENCOUNTER — Ambulatory Visit
Admission: RE | Admit: 2017-02-15 | Discharge: 2017-02-15 | Disposition: A | Discharge status: Home / Self Care | Payer: Medicare Other | Source: Ambulatory Visit | Attending: General Surgery | Admitting: General Surgery

## 2017-02-15 DIAGNOSIS — R1084 Generalized abdominal pain: Secondary | ICD-10-CM | POA: Diagnosis present

## 2017-02-15 MED ORDER — TECHNETIUM TC 99M MEBROFENIN IV KIT
5.0000 | PACK | Freq: Once | INTRAVENOUS | Status: AC | PRN
  Administered 2017-02-15: 5.26 via INTRAVENOUS

## 2017-02-21 ENCOUNTER — Telehealth: Payer: Self-pay

## 2017-02-21 NOTE — Telephone Encounter (Signed)
-----   Message from Robert Bellow, MD sent at 02/16/2017 10:29 AM EDT ----- Please notify the patient that her recent gallbladder scan was normal. I have no explanation for her diarrhea or weight loss. Formal GI consultation may be appropriate (she has seen Dr.Wohl and Lyla Glassing, NP in the past.  Methotrexate may cause diarrhea and she should discuss this with her rheumatologist.  ----- Message ----- From: Interface, Rad Results In Sent: 02/15/2017   1:39 PM To: Robert Bellow, MD

## 2017-02-21 NOTE — Telephone Encounter (Signed)
Notified patient as instructed, patient pleased. Discussed follow-up appointments, patient agrees  

## 2017-04-02 ENCOUNTER — Other Ambulatory Visit: Payer: Self-pay | Admitting: General Surgery

## 2017-07-15 ENCOUNTER — Ambulatory Visit: Payer: Medicare Other | Admitting: Podiatry

## 2017-07-29 ENCOUNTER — Ambulatory Visit: Payer: Medicare Other | Admitting: Podiatry

## 2017-11-14 IMAGING — RF DG SMALL BOWEL
2 series · 15 of 15 positions shown · non-contrast
Comparison: CT abdomen 05/24/2014

CLINICAL DATA: Chronic diarrhea for 1 month

EXAM:
SMALL BOWEL SERIES
TECHNIQUE: Following ingestion of thin barium, serial small bowel images were
obtained including spot views of the terminal ileum.
FLUOROSCOPY TIME:  Fluoroscopy Time:  1 minutes 6 seconds
Radiation Exposure Index (if provided by the fluoroscopic device):
17.2 mGy
Number of Acquired Spot Images: 0

[Series 1: t abdomen supine · 0.14mm/px · 6 of 6 slices shown]
[im 1/6]
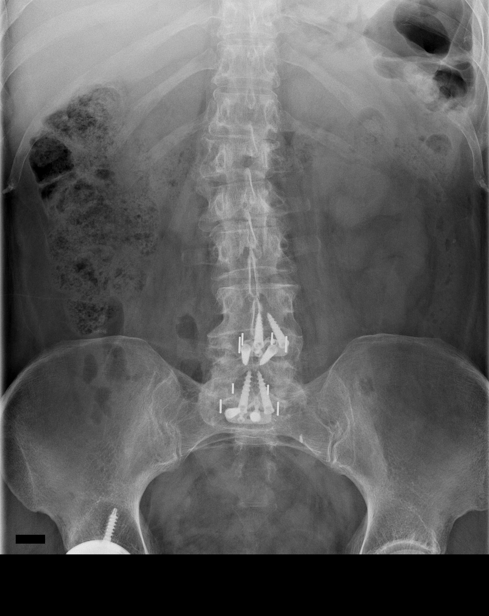
[im 2/6]
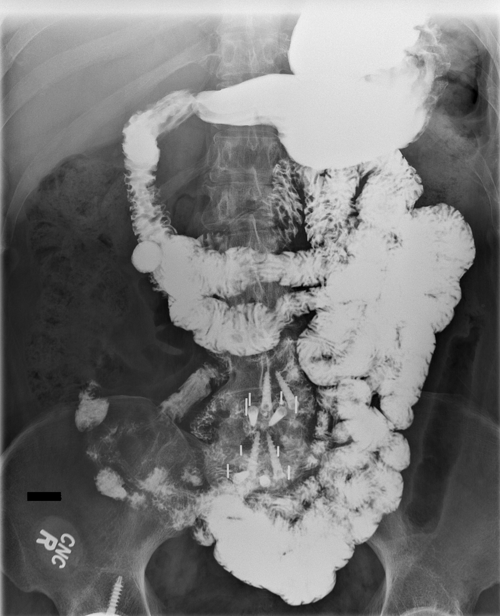
[im 3/6]
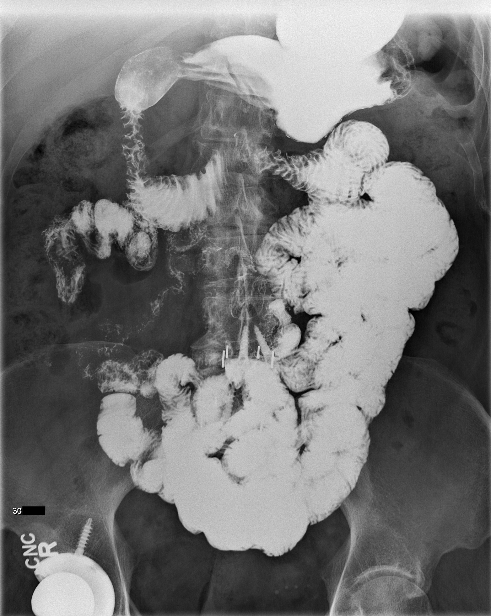
[im 4/6]
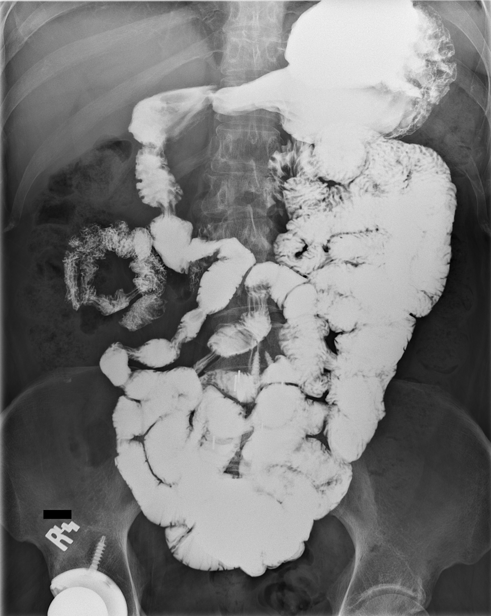
[im 5/6]
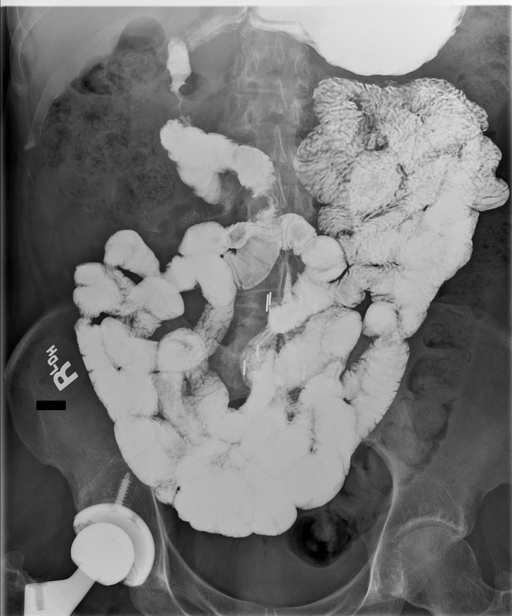
[im 6/6]
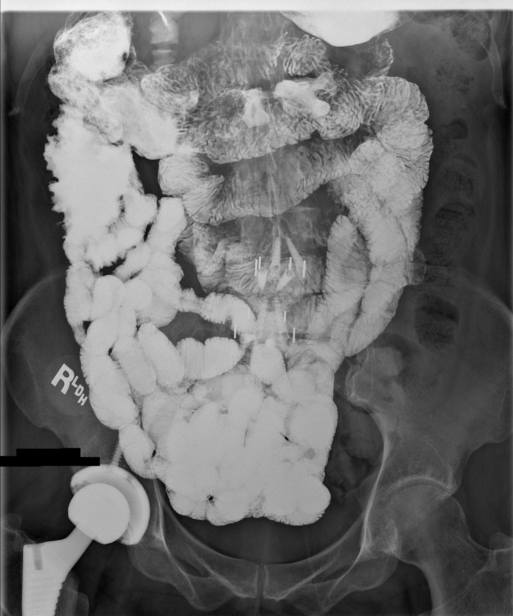

[Series 1: cp_standard · 0.27mm/px · 9 of 9 slices shown]
[im 1/9]
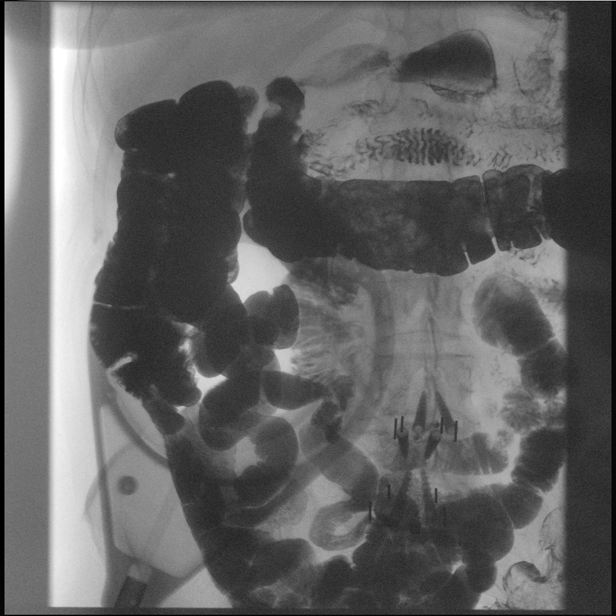
[im 2/9]
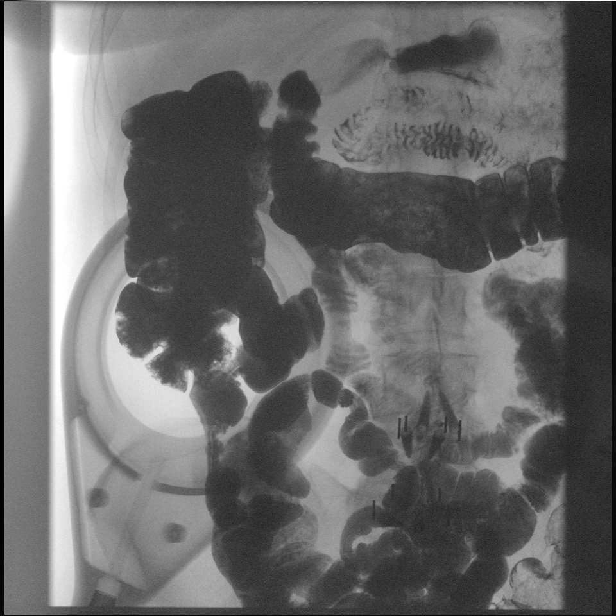
[im 3/9]
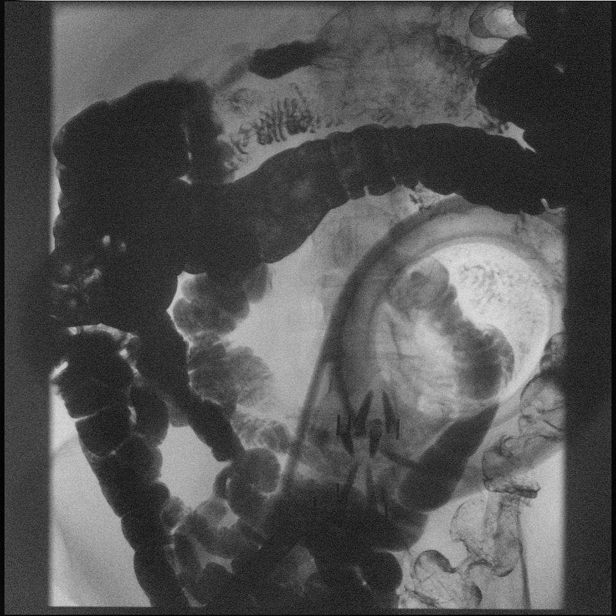
[im 4/9]
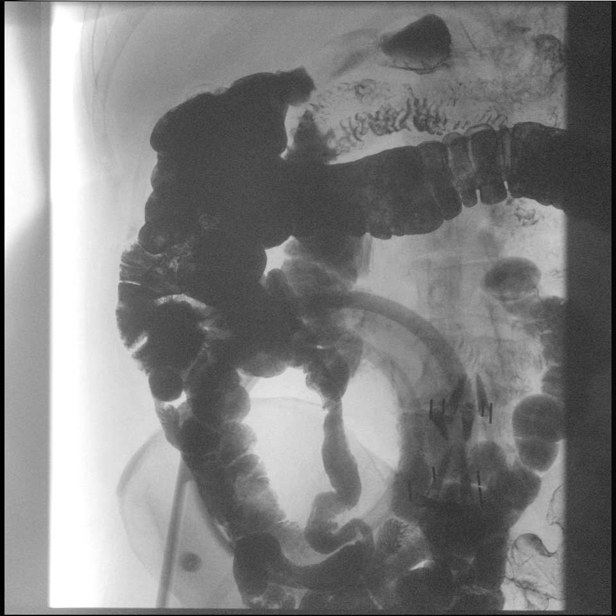
[im 5/9]
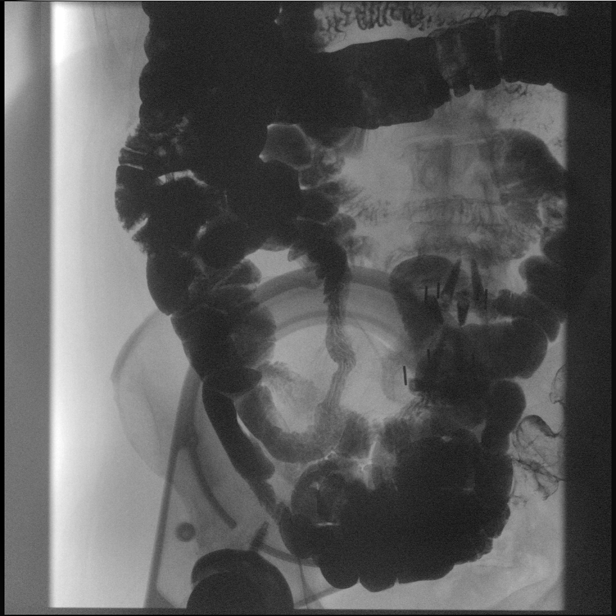
[im 6/9]
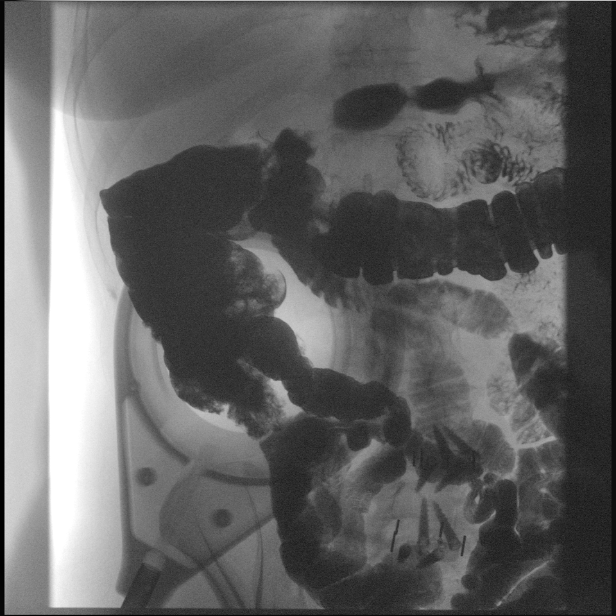
[im 7/9]
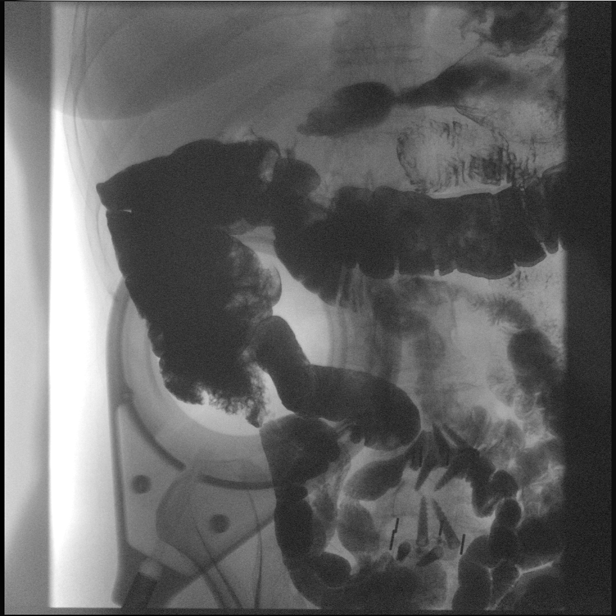
[im 8/9]
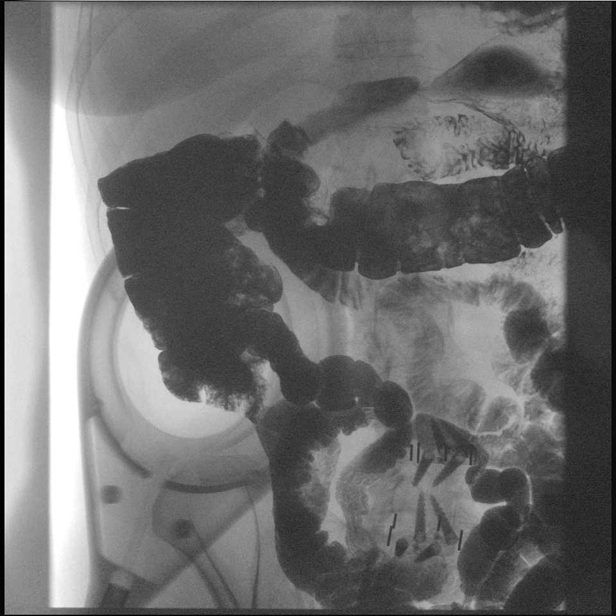
[im 9/9]
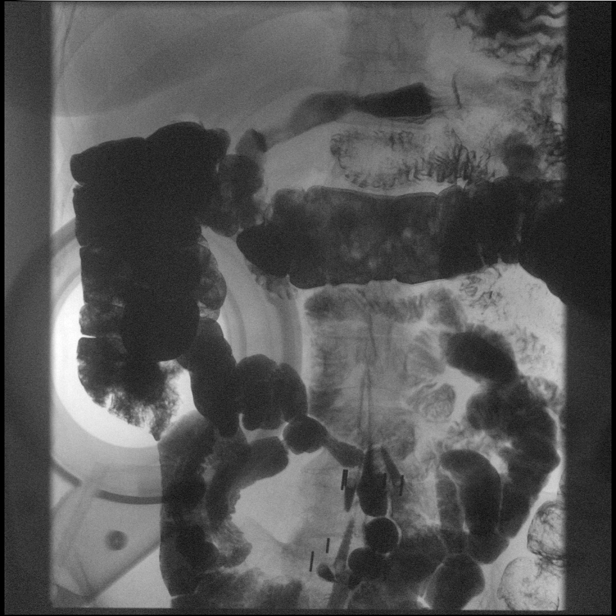

[15 of 15 positions shown; findings below may reference images not displayed]

FINDINGS: Scout frontal abdominal and pelvic radiograph demonstrates no bowel
dilatation or obstruction.

Medium density barium was periodically observed under fluoroscopy to
travel from the stomach to the ascending colon (over a 105 minute
time period). There is no evidence of small bowel stricture or
obstruction. No large filling defects to suggest mass lesion. In
addition, there is no evidence of tethering or definite inflammatory
changes present within the small bowel. Moderate amount of stool in
the ascending colon.
IMPRESSION: Normal small bowel follow-through.

## 2019-01-26 ENCOUNTER — Encounter: Payer: Self-pay | Admitting: Podiatry

## 2019-01-26 ENCOUNTER — Ambulatory Visit: Payer: Self-pay

## 2019-02-16 NOTE — Progress Notes (Signed)
This encounter was created in error - please disregard.

## 2019-07-03 ENCOUNTER — Other Ambulatory Visit: Payer: Self-pay

## 2019-07-03 DIAGNOSIS — Z20822 Contact with and (suspected) exposure to covid-19: Secondary | ICD-10-CM

## 2019-07-05 LAB — NOVEL CORONAVIRUS, NAA: SARS-CoV-2, NAA: NOT DETECTED

## 2019-11-02 ENCOUNTER — Other Ambulatory Visit: Payer: Self-pay

## 2019-11-02 ENCOUNTER — Encounter: Payer: Self-pay | Admitting: Podiatry

## 2019-11-02 ENCOUNTER — Ambulatory Visit: Payer: Medicare HMO | Admitting: Podiatry

## 2019-11-02 ENCOUNTER — Ambulatory Visit (INDEPENDENT_AMBULATORY_CARE_PROVIDER_SITE_OTHER): Payer: Medicare HMO

## 2019-11-02 DIAGNOSIS — M2041 Other hammer toe(s) (acquired), right foot: Secondary | ICD-10-CM | POA: Diagnosis not present

## 2019-11-02 DIAGNOSIS — Q828 Other specified congenital malformations of skin: Secondary | ICD-10-CM

## 2019-11-02 NOTE — Progress Notes (Signed)
Subjective:  Patient ID: Abigail [name], female    DOB: Dec 14, 1965,  MRN: [id number] HPI Chief Complaint  Patient presents with  . Toe Pain    2nd toe right - screw removed from toe in 2017, now toe is burning, callused area medial side  . New Patient (Initial Visit)    Est pt 2017    54 y.o. female presents with the above complaint.   ROS: Denies fever chills nausea vomiting muscle aches pains states that she has chronic pain she is being treated by a doctor at [facility] in [place].  She is currently taking morphine on a daily basis and is concerned about pain control after surgery.  Past Medical History:  Diagnosis Date  . Anxiety   . Arthritis   . Back pain   . Fibromyalgia   . H. pylori infection   . Hyperlipidemia   . IBS (irritable bowel syndrome)   . Migraines   . Muscle pain   . Reflux    Past Surgical History:  Procedure Laterality Date  . ABDOMINAL HYSTERECTOMY    . APPENDECTOMY  2009  . C5 FUSION    . C6 FUSION    . C7 FUSION    . COLONOSCOPY  02/2006  . COLONOSCOPY WITH PROPOFOL N/A 12/25/2016   Procedure: COLONOSCOPY WITH PROPOFOL;  Surgeon: Robert Bellow, MD;  Location: [facility] ENDOSCOPY;  Service: Endoscopy;  Laterality: N/A;  . ESOPHAGOGASTRODUODENOSCOPY (EGD) WITH PROPOFOL N/A 12/25/2016   Procedure: ESOPHAGOGASTRODUODENOSCOPY (EGD) WITH PROPOFOL;  Surgeon: Robert Bellow, MD;  Location: [facility] ENDOSCOPY;  Service: Endoscopy;  Laterality: N/A;  . FOOT SURGERY    . HIP ARTHROPLASTY    . L4 FUSION    . L5 FUSION    . S1 FUSION      Current Outpatient Medications:  .  albuterol (VENTOLIN HFA) 108 (90 Base) MCG/ACT inhaler, , Disp: , Rfl:  .  atorvastatin (LIPITOR) 40 MG tablet, , Disp: , Rfl:  .  Biotin 5000 MCG TABS, Take by mouth., Disp: , Rfl:  .  Cholecalciferol (VITAMIN D3) 10000 units TABS, Take 10,000 Units/day by mouth daily., Disp: , Rfl:  .  cyclobenzaprine (FLEXERIL) 10 MG tablet, , Disp: , Rfl:  .  DULoxetine (CYMBALTA) 60 MG  capsule, Take 60 mg by mouth 2 (two) times daily., Disp: , Rfl:  .  estradiol (ESTRACE) 2 MG tablet, Take 2 mg by mouth daily., Disp: , Rfl:  .  folic acid (FOLVITE) 1 MG tablet, Take 1 mg by mouth daily., Disp: , Rfl:  .  gabapentin (NEURONTIN) 300 MG capsule, , Disp: , Rfl:  .  gabapentin (NEURONTIN) 600 MG tablet, Take 600 mg by mouth 3 (three) times daily., Disp: , Rfl:  .  hydrochlorothiazide (HYDRODIURIL) 12.5 MG tablet, , Disp: , Rfl:  .  levothyroxine (SYNTHROID, LEVOTHROID) 75 MCG tablet, Take 75 mcg by mouth daily before breakfast., Disp: , Rfl:  .  meclizine (ANTIVERT) 25 MG tablet, , Disp: , Rfl:  .  methotrexate (RHEUMATREX) 2.5 MG tablet, Take 7.5 mg by mouth 3 (three) times a week., Disp: , Rfl:  .  morphine (MS CONTIN) 15 MG 12 hr tablet, Take 15 mg by mouth 4 (four) times daily as needed. , Disp: , Rfl:  .  pantoprazole (PROTONIX) 40 MG tablet, Take 40 mg by mouth daily., Disp: , Rfl:  .  ranitidine (ZANTAC) 300 MG capsule, Take 300 mg by mouth every evening., Disp: , Rfl:  .  simvastatin (ZOCOR)  20 MG tablet, Take 20 mg by mouth daily., Disp: , Rfl:   Allergies  Allergen Reactions  . Codeine   . Darvocet [Propoxyphene N-Acetaminophen]   . Sulfa Antibiotics   . Tape    Review of Systems Objective:  There were no vitals filed for this visit.  General: Well developed, nourished, in no acute distress, alert and oriented x3   Dermatological: Skin is warm, dry and supple bilateral. Nails x 10 are well maintained; remaining integument appears unremarkable at this time. There are no open sores, no preulcerative lesions, no rash or signs of infection present.  Reactive hyperkeratotic lesion medial aspect of the second PIPJ secondary to rubbing the lateral aspect of the hallux.  There is no open lesions or wounds.  Mild callused area that is painful.  Vascular: Dorsalis Pedis artery and Posterior Tibial artery pedal pulses are 2/4 bilateral with immedate capillary fill time.  Pedal hair growth present. No varicosities and no lower extremity edema present bilateral.   Neruologic: Grossly intact via light touch bilateral. Vibratory intact via tuning fork bilateral. Protective threshold with Semmes Wienstein monofilament intact to all pedal sites bilateral. Patellar and Achilles deep tendon reflexes 2+ bilateral. No Babinski or clonus noted bilateral.   Musculoskeletal: No gross boney pedal deformities bilateral. No pain, crepitus, or limitation noted with foot and ankle range of motion bilateral. Muscular strength 5/5 in all groups tested bilateral.  Hypermobility of the first metatarsal cuneiform joint.  This is resulted in a recurrence of her bunion deformity.  This has since then resulted in a lateral deviation or fracturing of the distal portion of the toe at the level of the PIPJ.  This is resulted in a soft tissue hyperkeratotic lesion which is painful for her.  I examined both feet today both of them are hypermobile though she has not had a recurrence of the left bunion as she did on the right foot.  Gait: Unassisted, Nonantalgic.    Radiographs:  Radiographs taken today demonstrate an increase in the first intermetatarsal angle greater than normal value.  This has resulted in lateral deviation of the interphalangeal joint proximal second toe right foot.  It appears that it actually broke the old fusion site.  Third toe still appears fused.  The bunion correction to the first metatarsal has failed.  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  Previously she had a very nice correction.  Plan: Discussed etiology pathology conservative surgical therapies at this point she would have to have an extensive surgical repair to  correct this abnormality.  This would consist of a Lapidus procedure and a bunion repair.  It would also be necessary to once again in the toe in a corrected position.  This would then need to be casted.  She is very sensitive to pain and both feet ended her up in the emergency department after surgery.  If surgery were performed told her catheter would be necessary.  We would also need to discuss pain management with her doctor from [facility].  I debrided the area of reactive hyperkeratosis to the medial aspect of the second digit right foot.  I then placed that a silicone dressing over it and gave her samples of these.  I will follow-up with  her on an as-needed basis.     Refael Fulop T. [place], [place]

## 2020-04-26 ENCOUNTER — Telehealth (INDEPENDENT_AMBULATORY_CARE_PROVIDER_SITE_OTHER): Payer: Medicare HMO | Admitting: Family Medicine

## 2020-04-26 ENCOUNTER — Encounter: Payer: Self-pay | Admitting: Family Medicine

## 2020-04-26 DIAGNOSIS — F419 Anxiety disorder, unspecified: Secondary | ICD-10-CM

## 2020-04-26 DIAGNOSIS — E782 Mixed hyperlipidemia: Secondary | ICD-10-CM

## 2020-04-26 DIAGNOSIS — K219 Gastro-esophageal reflux disease without esophagitis: Secondary | ICD-10-CM

## 2020-04-26 DIAGNOSIS — F329 Major depressive disorder, single episode, unspecified: Secondary | ICD-10-CM

## 2020-04-26 DIAGNOSIS — R110 Nausea: Secondary | ICD-10-CM

## 2020-04-26 DIAGNOSIS — K5903 Drug induced constipation: Secondary | ICD-10-CM | POA: Insufficient documentation

## 2020-04-26 DIAGNOSIS — R42 Dizziness and giddiness: Secondary | ICD-10-CM | POA: Diagnosis not present

## 2020-04-26 DIAGNOSIS — M5030 Other cervical disc degeneration, unspecified cervical region: Secondary | ICD-10-CM

## 2020-04-26 DIAGNOSIS — J45909 Unspecified asthma, uncomplicated: Secondary | ICD-10-CM | POA: Insufficient documentation

## 2020-04-26 DIAGNOSIS — Z7689 Persons encountering health services in other specified circumstances: Secondary | ICD-10-CM

## 2020-04-26 DIAGNOSIS — M059 Rheumatoid arthritis with rheumatoid factor, unspecified: Secondary | ICD-10-CM

## 2020-04-26 DIAGNOSIS — J4520 Mild intermittent asthma, uncomplicated: Secondary | ICD-10-CM

## 2020-04-26 DIAGNOSIS — Z1211 Encounter for screening for malignant neoplasm of colon: Secondary | ICD-10-CM | POA: Insufficient documentation

## 2020-04-26 DIAGNOSIS — F32A Depression, unspecified: Secondary | ICD-10-CM

## 2020-04-26 DIAGNOSIS — R739 Hyperglycemia, unspecified: Secondary | ICD-10-CM

## 2020-04-26 DIAGNOSIS — G8929 Other chronic pain: Secondary | ICD-10-CM

## 2020-04-26 DIAGNOSIS — Z79899 Other long term (current) drug therapy: Secondary | ICD-10-CM

## 2020-04-26 MED ORDER — PROMETHAZINE HCL 25 MG PO TABS: 25.0000 mg | ORAL_TABLET | Freq: Three times a day (TID) | ORAL | 0 refills | Status: DC | PRN

## 2020-04-26 MED ORDER — MECLIZINE HCL 25 MG PO TABS: 25.0000 mg | ORAL_TABLET | Freq: Three times a day (TID) | ORAL | 1 refills | Status: DC | PRN

## 2020-04-26 MED ORDER — POLYETHYLENE GLYCOL 3350 17 GM/SCOOP PO POWD: 17.0000 g | Freq: Two times a day (BID) | ORAL | 1 refills | Status: DC | PRN

## 2020-04-26 NOTE — Assessment & Plan Note (Signed)
New patient establishment at [facility] for primary care.  First visit through [facility], will have labs drawn and meet in clinic in 1 week for in office visit and lab review.

## 2020-04-26 NOTE — Patient Instructions (Signed)
As we discussed, I have put sent in prescription refills for your meclizine for vertigo and for mirilax for your drug-induced constipation.  It is very important that you continue to increase your water intake and take the mirilax as prescribed to prevent constipation.  I have sent in a prescription for your phenergan, to take as needed for your nausea.  This should not be a daily medication and with having a regular bowel regimen, this will cut down on your need to take this.  Have your labs drawn in the next few days and I will plan to see you in clinic next week to review.  We will plan to see you back in 1 weeks for lab work review and in office meeting  You will receive a survey after today's visit either digitally by e-mail or paper by [employer]. Your experiences and feedback matter to [place].  Please respond so we know how we are doing as we provide care for you.  Call us with any questions/concerns/needs.  It is my goal to be available to you for your health concerns.  Thanks for choosing me to be a partner in your healthcare needs!  Harlin Rain, FNP-C Family Nurse Practitioner [facility] Phone: [phone]

## 2020-04-26 NOTE — Assessment & Plan Note (Signed)
Nausea that is exacerbated when she has worsening drug induced constipation, that is alleviated when she has a bowel movement.  Reports in the past she can have some relief of her nausea with phenergan.  Plan: 1. Work on correcting constipation with daily mirilax usage, as discussed in Constipation A/P 2. Can take phenergan 12.5-25mg  every 8 hours as needed for nausea

## 2020-04-26 NOTE — Assessment & Plan Note (Signed)
Follows with [facility]

## 2020-04-26 NOTE — Assessment & Plan Note (Signed)
Reports stable and sporadically uses albuterol inhaler.

## 2020-04-26 NOTE — Assessment & Plan Note (Signed)
Reports stable and well controlled with duloxetine.

## 2020-04-26 NOTE — Assessment & Plan Note (Signed)
Hx of intermittent vertigo, has taken meclizine 25mg  TID PRN in the past.  Requesting refill

## 2020-04-26 NOTE — Assessment & Plan Note (Signed)
Reports stable and well controlled with pantoprazole 40mg  daily.

## 2020-04-26 NOTE — Assessment & Plan Note (Signed)
Follows with Abigail [name] with [facility] in [place], [place]

## 2020-04-26 NOTE — Assessment & Plan Note (Signed)
Reports has constipation, typically has a bowel movement every 8-10 days, has been prescribed mirilax in the past but is not the best about taking this daily.  Discussed with her medications that she is presently prescribed for her chronic pain, without taking a daily mirilax, she will continue to have constipation.  Patient verbalized understanding and is interested in rx for mirilax and to restart on this today.  Plan: 1. Begin mirilax daily as directed 2. Increase water intake

## 2020-04-26 NOTE — Assessment & Plan Note (Addendum)
Currently followed with Dr. Merilynn Finland with [facility] and is prescribed gabapentin, cyclobenzaprine and MS Contin through their office.  Discussed these medications will not be filled through our office and patient verbalized understanding.

## 2020-04-26 NOTE — Assessment & Plan Note (Signed)
See anxiety A/P. 

## 2020-04-26 NOTE — Progress Notes (Signed)
Virtual Visit via MyChart Video Visit  The purpose of this virtual visit is to provide medical care while limiting exposure to the novel coronavirus (COVID19) for both patient and office staff.  Consent was obtained for phone visit:  Yes.   Answered questions that patient had about telehealth interaction:  Yes.   I discussed the limitations, risks, security and privacy concerns of performing an evaluation and management service by telephone. I also discussed with the patient that there may be a patient responsible charge related to this service. The patient expressed understanding and agreed to proceed.  Patient is at home and is accessed via [place] are provided by Harlin Rain, FNP-C from [facility])  ---------------------------------------------------------------------- Chief Complaint  Patient presents with  . Establish Care    S: Reviewed CMA documentation. I have called patient and gathered additional HPI as follows:  Abigail [name] presents for MyChart Video Visit as new patient establishment for primary care.  Has acute concerns for medication refills for her vertigo, drug induced constipation and nausea.  Has been following with another provider locally and reports recent labs within the past year.  We will request copies of her medical records.  Review of past medical, surgical and family history.  Patient is currently home Denies any high risk travel to areas of current concern for COVID19. Denies any known or suspected exposure to person with or possibly with COVID19.  Past Medical History:  Diagnosis Date  . Anxiety   . Arthritis   . Back pain   . Diarrhea 11/12/2016  . Fibromyalgia   . Generalized abdominal pain 11/12/2016  . H. pylori infection   . Hyperlipidemia   . IBS (irritable bowel syndrome)   . Infectious colitis 04/29/2016  . Migraines   . Moderate dehydration 04/29/2016  . Muscle pain   . Reflux   .  Unexplained weight loss 11/12/2016   Social History   Tobacco Use  . Smoking status: Current Every Day Smoker    Packs/day: 1.00    Types: Cigarettes  . Smokeless tobacco: Never Used  . Tobacco comment: ELECTRONIC VAPOR  Substance Use Topics  . Alcohol use: Yes    Comment: OCCASIONALLY  . Drug use: No    Current Outpatient Medications:  .  albuterol (VENTOLIN HFA) 108 (90 Base) MCG/ACT inhaler, , Disp: , Rfl:  .  atorvastatin (LIPITOR) 40 MG tablet, , Disp: , Rfl:  .  Biotin 5000 MCG TABS, Take by mouth., Disp: , Rfl:  .  Cholecalciferol (VITAMIN D3) 10000 units TABS, Take 10,000 Units/day by mouth daily., Disp: , Rfl:  .  cyclobenzaprine (FLEXERIL) 10 MG tablet, , Disp: , Rfl:  .  DULoxetine (CYMBALTA) 60 MG capsule, Take 60 mg by mouth 2 (two) times daily., Disp: , Rfl:  .  estradiol (ESTRACE) 2 MG tablet, Take 2 mg by mouth daily., Disp: , Rfl:  .  folic acid (FOLVITE) 1 MG tablet, Take 1 mg by mouth daily., Disp: , Rfl:  .  gabapentin (NEURONTIN) 300 MG capsule, , Disp: , Rfl:  .  gabapentin (NEURONTIN) 600 MG tablet, Take 600 mg by mouth 3 (three) times daily., Disp: , Rfl:  .  hydrochlorothiazide (HYDRODIURIL) 12.5 MG tablet, , Disp: , Rfl:  .  levothyroxine (SYNTHROID, LEVOTHROID) 75 MCG tablet, Take 75 mcg by mouth daily before breakfast., Disp: , Rfl:  .  meclizine (ANTIVERT) 25 MG tablet, Take 1 tablet (25 mg total) by mouth 3 (three) times  daily as needed for dizziness., Disp: 90 tablet, Rfl: 1 .  methotrexate (RHEUMATREX) 2.5 MG tablet, Take 7.5 mg by mouth 3 (three) times a week., Disp: , Rfl:  .  morphine (MS CONTIN) 15 MG 12 hr tablet, Take 15 mg by mouth 4 (four) times daily as needed. , Disp: , Rfl:  .  pantoprazole (PROTONIX) 40 MG tablet, Take 40 mg by mouth daily., Disp: , Rfl:  .  polyethylene glycol powder (GLYCOLAX/MIRALAX) 17 GM/SCOOP powder, Take 17 g by mouth 2 (two) times daily as needed., Disp: 3350 g, Rfl: 1 .  promethazine (PHENERGAN) 25 MG tablet, Take  1 tablet (25 mg total) by mouth every 8 (eight) hours as needed for nausea or vomiting., Disp: 20 tablet, Rfl: 0 .  ranitidine (ZANTAC) 300 MG capsule, Take 300 mg by mouth every evening., Disp: , Rfl:   No flowsheet data found.  No flowsheet data found.  -------------------------------------------------------------------------- O: No physical exam performed due to remote telephone encounter.  Physical Exam: Patient remotely monitored with video.  Verbal communication appropriate.  Cognition normal.  No results found for this or any previous visit (from the past 2160 hour(s)).  -------------------------------------------------------------------------- A&P:  Problem List Items Addressed This Visit      Respiratory   Asthma    Reports stable and sporadically uses albuterol inhaler.        Digestive   Gastroesophageal reflux disease    Reports stable and well controlled with pantoprazole 40mg  daily.      Relevant Medications   meclizine (ANTIVERT) 25 MG tablet   polyethylene glycol powder (GLYCOLAX/MIRALAX) 17 GM/SCOOP powder   Drug-induced constipation    Reports has constipation, typically has a bowel movement every 8-10 days, has been prescribed mirilax in the past but is not the best about taking this daily.  Discussed with her medications that she is presently prescribed for her chronic pain, without taking a daily mirilax, she will continue to have constipation.  Patient verbalized understanding and is interested in rx for mirilax and to restart on this today.  Plan: 1. Begin mirilax daily as directed 2. Increase water intake      Relevant Medications   polyethylene glycol powder (GLYCOLAX/MIRALAX) 17 GM/SCOOP powder     Musculoskeletal and Integument   DDD (degenerative disc disease), cervical    Follows with [facility]      RA (rheumatoid arthritis) ([place])    Follows with Darcel Bayley with [facility] in [place], [place]        Other   Anxiety    Reports  stable and well controlled with duloxetine.      Chronic pain    Currently followed with Dr. Merilynn Finland with [facility] and is prescribed gabapentin, cyclobenzaprine and MS Contin through their office.  Discussed these medications will not be filled through our office and patient verbalized understanding.      Depression    See anxiety A/P      Vertigo    Hx of intermittent vertigo, has taken meclizine 25mg  TID PRN in the past.  Requesting refill      Relevant Medications   meclizine (ANTIVERT) 25 MG tablet   Nausea    Nausea that is exacerbated when she has worsening drug induced constipation, that is alleviated when she has a bowel movement.  Reports in the past she can have some relief of her nausea with phenergan.  Plan: 1. Work on correcting constipation with daily mirilax usage, as discussed in Constipation A/P 2. Can take  phenergan 12.5-25mg  every 8 hours as needed for nausea      Relevant Medications   promethazine (PHENERGAN) 25 MG tablet   Encounter to establish care with new doctor - Primary    New patient establishment at [facility] for primary care.  First visit through [place] [facility], will have labs drawn and meet in clinic in 1 week for in office visit and lab review.      Relevant Orders   CBC with Differential   COMPLETE METABOLIC PANEL WITH GFR    Other Visit Diagnoses    Long-term use of high-risk medication       Relevant Orders   Thyroid Panel With TSH   Mixed hyperlipidemia       Relevant Orders   Lipid Profile   Hyperglycemia       Relevant Orders   CBC with Differential   COMPLETE METABOLIC PANEL WITH GFR   HgB A1c      Meds ordered this encounter  Medications  . meclizine (ANTIVERT) 25 MG tablet    Sig: Take 1 tablet (25 mg total) by mouth 3 (three) times daily as needed for dizziness.    Dispense:  90 tablet    Refill:  1  . polyethylene glycol powder (GLYCOLAX/MIRALAX) 17 GM/SCOOP powder    Sig: Take 17 g by mouth 2 (two) times daily  as needed.    Dispense:  3350 g    Refill:  1  . promethazine (PHENERGAN) 25 MG tablet    Sig: Take 1 tablet (25 mg total) by mouth every 8 (eight) hours as needed for nausea or vomiting.    Dispense:  20 tablet    Refill:  0    Follow-up: - Return to clinic in 1 week to review lab work and for in office visit - Have labs drawn in the next few days  Patient verbalizes understanding with the above medical recommendations including the limitation of remote medical advice.  Specific follow-up and call-back criteria were given for patient to follow-up or seek medical care more urgently if needed.  - Time spent in direct consultation with patient on video: 14 minutes  Harlin Rain, [facility] 04/26/2020, 11:57 AM

## 2020-04-27 ENCOUNTER — Other Ambulatory Visit: Payer: Medicare HMO

## 2020-05-06 ENCOUNTER — Ambulatory Visit: Payer: Medicare HMO | Admitting: Family Medicine

## 2020-08-26 ENCOUNTER — Other Ambulatory Visit: Payer: Medicare Other

## 2020-10-25 DIAGNOSIS — M79671 Pain in right foot: Secondary | ICD-10-CM | POA: Diagnosis not present

## 2020-10-25 DIAGNOSIS — Z79891 Long term (current) use of opiate analgesic: Secondary | ICD-10-CM | POA: Diagnosis not present

## 2020-10-25 DIAGNOSIS — M48061 Spinal stenosis, lumbar region without neurogenic claudication: Secondary | ICD-10-CM | POA: Diagnosis not present

## 2020-10-25 DIAGNOSIS — M7918 Myalgia, other site: Secondary | ICD-10-CM | POA: Diagnosis not present

## 2020-10-25 DIAGNOSIS — G43009 Migraine without aura, not intractable, without status migrainosus: Secondary | ICD-10-CM | POA: Diagnosis not present

## 2020-10-25 DIAGNOSIS — M79605 Pain in left leg: Secondary | ICD-10-CM | POA: Diagnosis not present

## 2020-10-25 DIAGNOSIS — M797 Fibromyalgia: Secondary | ICD-10-CM | POA: Diagnosis not present

## 2020-10-25 DIAGNOSIS — M6281 Muscle weakness (generalized): Secondary | ICD-10-CM | POA: Diagnosis not present

## 2020-10-25 DIAGNOSIS — M546 Pain in thoracic spine: Secondary | ICD-10-CM | POA: Diagnosis not present

## 2020-10-25 DIAGNOSIS — Z5181 Encounter for therapeutic drug level monitoring: Secondary | ICD-10-CM | POA: Diagnosis not present

## 2020-10-25 DIAGNOSIS — G894 Chronic pain syndrome: Secondary | ICD-10-CM | POA: Diagnosis not present

## 2020-10-25 DIAGNOSIS — M25552 Pain in left hip: Secondary | ICD-10-CM | POA: Diagnosis not present

## 2020-11-10 DIAGNOSIS — M48061 Spinal stenosis, lumbar region without neurogenic claudication: Secondary | ICD-10-CM | POA: Diagnosis not present

## 2020-12-01 DIAGNOSIS — M48061 Spinal stenosis, lumbar region without neurogenic claudication: Secondary | ICD-10-CM | POA: Diagnosis not present

## 2020-12-01 DIAGNOSIS — M79605 Pain in left leg: Secondary | ICD-10-CM | POA: Diagnosis not present

## 2020-12-01 DIAGNOSIS — M79671 Pain in right foot: Secondary | ICD-10-CM | POA: Diagnosis not present

## 2020-12-01 DIAGNOSIS — M546 Pain in thoracic spine: Secondary | ICD-10-CM | POA: Diagnosis not present

## 2020-12-01 DIAGNOSIS — M5416 Radiculopathy, lumbar region: Secondary | ICD-10-CM | POA: Diagnosis not present

## 2020-12-01 DIAGNOSIS — M6281 Muscle weakness (generalized): Secondary | ICD-10-CM | POA: Diagnosis not present

## 2020-12-01 DIAGNOSIS — M7918 Myalgia, other site: Secondary | ICD-10-CM | POA: Diagnosis not present

## 2020-12-01 DIAGNOSIS — M542 Cervicalgia: Secondary | ICD-10-CM | POA: Diagnosis not present

## 2020-12-01 DIAGNOSIS — M25552 Pain in left hip: Secondary | ICD-10-CM | POA: Diagnosis not present

## 2020-12-01 DIAGNOSIS — Z79891 Long term (current) use of opiate analgesic: Secondary | ICD-10-CM | POA: Diagnosis not present

## 2020-12-01 DIAGNOSIS — G894 Chronic pain syndrome: Secondary | ICD-10-CM | POA: Diagnosis not present

## 2020-12-14 DIAGNOSIS — M545 Low back pain, unspecified: Secondary | ICD-10-CM | POA: Diagnosis not present

## 2020-12-14 DIAGNOSIS — M546 Pain in thoracic spine: Secondary | ICD-10-CM | POA: Diagnosis not present

## 2020-12-23 DIAGNOSIS — M810 Age-related osteoporosis without current pathological fracture: Secondary | ICD-10-CM | POA: Diagnosis not present

## 2020-12-23 DIAGNOSIS — M818 Other osteoporosis without current pathological fracture: Secondary | ICD-10-CM | POA: Diagnosis not present

## 2020-12-27 DIAGNOSIS — G894 Chronic pain syndrome: Secondary | ICD-10-CM | POA: Diagnosis not present

## 2020-12-27 DIAGNOSIS — K219 Gastro-esophageal reflux disease without esophagitis: Secondary | ICD-10-CM | POA: Diagnosis not present

## 2020-12-27 DIAGNOSIS — M797 Fibromyalgia: Secondary | ICD-10-CM | POA: Diagnosis not present

## 2020-12-27 DIAGNOSIS — E559 Vitamin D deficiency, unspecified: Secondary | ICD-10-CM | POA: Diagnosis not present

## 2020-12-27 DIAGNOSIS — E782 Mixed hyperlipidemia: Secondary | ICD-10-CM | POA: Diagnosis not present

## 2020-12-27 DIAGNOSIS — J449 Chronic obstructive pulmonary disease, unspecified: Secondary | ICD-10-CM | POA: Diagnosis not present

## 2020-12-27 DIAGNOSIS — E039 Hypothyroidism, unspecified: Secondary | ICD-10-CM | POA: Diagnosis not present

## 2020-12-27 DIAGNOSIS — I10 Essential (primary) hypertension: Secondary | ICD-10-CM | POA: Diagnosis not present

## 2020-12-29 DIAGNOSIS — M79605 Pain in left leg: Secondary | ICD-10-CM | POA: Diagnosis not present

## 2020-12-29 DIAGNOSIS — M7918 Myalgia, other site: Secondary | ICD-10-CM | POA: Diagnosis not present

## 2020-12-29 DIAGNOSIS — M79671 Pain in right foot: Secondary | ICD-10-CM | POA: Diagnosis not present

## 2020-12-29 DIAGNOSIS — M25512 Pain in left shoulder: Secondary | ICD-10-CM | POA: Diagnosis not present

## 2020-12-29 DIAGNOSIS — M546 Pain in thoracic spine: Secondary | ICD-10-CM | POA: Diagnosis not present

## 2020-12-29 DIAGNOSIS — M48061 Spinal stenosis, lumbar region without neurogenic claudication: Secondary | ICD-10-CM | POA: Diagnosis not present

## 2020-12-29 DIAGNOSIS — G894 Chronic pain syndrome: Secondary | ICD-10-CM | POA: Diagnosis not present

## 2020-12-29 DIAGNOSIS — Z79891 Long term (current) use of opiate analgesic: Secondary | ICD-10-CM | POA: Diagnosis not present

## 2020-12-29 DIAGNOSIS — M25552 Pain in left hip: Secondary | ICD-10-CM | POA: Diagnosis not present

## 2020-12-29 DIAGNOSIS — M6281 Muscle weakness (generalized): Secondary | ICD-10-CM | POA: Diagnosis not present

## 2020-12-29 DIAGNOSIS — G43009 Migraine without aura, not intractable, without status migrainosus: Secondary | ICD-10-CM | POA: Diagnosis not present

## 2021-02-07 DIAGNOSIS — R5382 Chronic fatigue, unspecified: Secondary | ICD-10-CM | POA: Diagnosis not present

## 2021-02-07 DIAGNOSIS — R739 Hyperglycemia, unspecified: Secondary | ICD-10-CM | POA: Diagnosis not present

## 2021-02-07 DIAGNOSIS — R232 Flushing: Secondary | ICD-10-CM | POA: Diagnosis not present

## 2021-02-07 DIAGNOSIS — M797 Fibromyalgia: Secondary | ICD-10-CM | POA: Diagnosis not present

## 2021-02-07 DIAGNOSIS — E039 Hypothyroidism, unspecified: Secondary | ICD-10-CM | POA: Diagnosis not present

## 2021-04-05 DIAGNOSIS — Z5181 Encounter for therapeutic drug level monitoring: Secondary | ICD-10-CM | POA: Diagnosis not present

## 2021-04-05 DIAGNOSIS — Z79899 Other long term (current) drug therapy: Secondary | ICD-10-CM | POA: Diagnosis not present

## 2021-04-13 DIAGNOSIS — M48061 Spinal stenosis, lumbar region without neurogenic claudication: Secondary | ICD-10-CM | POA: Diagnosis not present

## 2021-04-13 DIAGNOSIS — M546 Pain in thoracic spine: Secondary | ICD-10-CM | POA: Diagnosis not present

## 2021-04-13 DIAGNOSIS — M6281 Muscle weakness (generalized): Secondary | ICD-10-CM | POA: Diagnosis not present

## 2021-04-13 DIAGNOSIS — M25552 Pain in left hip: Secondary | ICD-10-CM | POA: Diagnosis not present

## 2021-04-13 DIAGNOSIS — M79671 Pain in right foot: Secondary | ICD-10-CM | POA: Diagnosis not present

## 2021-04-13 DIAGNOSIS — M79605 Pain in left leg: Secondary | ICD-10-CM | POA: Diagnosis not present

## 2021-04-13 DIAGNOSIS — M542 Cervicalgia: Secondary | ICD-10-CM | POA: Diagnosis not present

## 2021-04-13 DIAGNOSIS — Z79891 Long term (current) use of opiate analgesic: Secondary | ICD-10-CM | POA: Diagnosis not present

## 2021-04-13 DIAGNOSIS — G894 Chronic pain syndrome: Secondary | ICD-10-CM | POA: Diagnosis not present

## 2021-04-13 DIAGNOSIS — M7918 Myalgia, other site: Secondary | ICD-10-CM | POA: Diagnosis not present

## 2021-04-25 DIAGNOSIS — M79605 Pain in left leg: Secondary | ICD-10-CM | POA: Diagnosis not present

## 2021-04-25 DIAGNOSIS — M6281 Muscle weakness (generalized): Secondary | ICD-10-CM | POA: Diagnosis not present

## 2021-04-25 DIAGNOSIS — M48061 Spinal stenosis, lumbar region without neurogenic claudication: Secondary | ICD-10-CM | POA: Diagnosis not present

## 2021-04-25 DIAGNOSIS — Z5181 Encounter for therapeutic drug level monitoring: Secondary | ICD-10-CM | POA: Diagnosis not present

## 2021-04-25 DIAGNOSIS — M25552 Pain in left hip: Secondary | ICD-10-CM | POA: Diagnosis not present

## 2021-04-25 DIAGNOSIS — Z79899 Other long term (current) drug therapy: Secondary | ICD-10-CM | POA: Diagnosis not present

## 2021-04-25 DIAGNOSIS — M546 Pain in thoracic spine: Secondary | ICD-10-CM | POA: Diagnosis not present

## 2021-04-25 DIAGNOSIS — M79671 Pain in right foot: Secondary | ICD-10-CM | POA: Diagnosis not present

## 2021-04-25 DIAGNOSIS — G43009 Migraine without aura, not intractable, without status migrainosus: Secondary | ICD-10-CM | POA: Diagnosis not present

## 2021-04-25 DIAGNOSIS — M7918 Myalgia, other site: Secondary | ICD-10-CM | POA: Diagnosis not present

## 2021-04-25 DIAGNOSIS — G894 Chronic pain syndrome: Secondary | ICD-10-CM | POA: Diagnosis not present

## 2021-04-25 DIAGNOSIS — Z79891 Long term (current) use of opiate analgesic: Secondary | ICD-10-CM | POA: Diagnosis not present

## 2021-05-01 DIAGNOSIS — M5442 Lumbago with sciatica, left side: Secondary | ICD-10-CM | POA: Diagnosis not present

## 2021-05-04 ENCOUNTER — Other Ambulatory Visit: Payer: Self-pay

## 2021-05-04 DIAGNOSIS — R42 Dizziness and giddiness: Secondary | ICD-10-CM

## 2021-05-05 ENCOUNTER — Other Ambulatory Visit: Payer: Self-pay

## 2021-05-08 DIAGNOSIS — M5441 Lumbago with sciatica, right side: Secondary | ICD-10-CM | POA: Diagnosis not present

## 2021-05-19 ENCOUNTER — Other Ambulatory Visit: Payer: Self-pay

## 2021-05-22 DIAGNOSIS — M6281 Muscle weakness (generalized): Secondary | ICD-10-CM | POA: Diagnosis not present

## 2021-05-22 DIAGNOSIS — Z5181 Encounter for therapeutic drug level monitoring: Secondary | ICD-10-CM | POA: Diagnosis not present

## 2021-05-22 DIAGNOSIS — M25552 Pain in left hip: Secondary | ICD-10-CM | POA: Diagnosis not present

## 2021-05-22 DIAGNOSIS — G894 Chronic pain syndrome: Secondary | ICD-10-CM | POA: Diagnosis not present

## 2021-05-22 DIAGNOSIS — M542 Cervicalgia: Secondary | ICD-10-CM | POA: Diagnosis not present

## 2021-05-22 DIAGNOSIS — M25511 Pain in right shoulder: Secondary | ICD-10-CM | POA: Diagnosis not present

## 2021-05-22 DIAGNOSIS — M7918 Myalgia, other site: Secondary | ICD-10-CM | POA: Diagnosis not present

## 2021-05-22 DIAGNOSIS — G43009 Migraine without aura, not intractable, without status migrainosus: Secondary | ICD-10-CM | POA: Diagnosis not present

## 2021-05-22 DIAGNOSIS — M48061 Spinal stenosis, lumbar region without neurogenic claudication: Secondary | ICD-10-CM | POA: Diagnosis not present

## 2021-05-22 DIAGNOSIS — M79605 Pain in left leg: Secondary | ICD-10-CM | POA: Diagnosis not present

## 2021-05-22 DIAGNOSIS — M546 Pain in thoracic spine: Secondary | ICD-10-CM | POA: Diagnosis not present

## 2021-05-22 DIAGNOSIS — M79671 Pain in right foot: Secondary | ICD-10-CM | POA: Diagnosis not present

## 2021-05-22 DIAGNOSIS — Z79891 Long term (current) use of opiate analgesic: Secondary | ICD-10-CM | POA: Diagnosis not present

## 2021-05-30 DIAGNOSIS — J449 Chronic obstructive pulmonary disease, unspecified: Secondary | ICD-10-CM | POA: Diagnosis not present

## 2021-05-30 DIAGNOSIS — M47817 Spondylosis without myelopathy or radiculopathy, lumbosacral region: Secondary | ICD-10-CM | POA: Diagnosis not present

## 2021-06-01 DIAGNOSIS — Z79891 Long term (current) use of opiate analgesic: Secondary | ICD-10-CM | POA: Diagnosis not present

## 2021-06-01 DIAGNOSIS — G43009 Migraine without aura, not intractable, without status migrainosus: Secondary | ICD-10-CM | POA: Diagnosis not present

## 2021-06-01 DIAGNOSIS — M48061 Spinal stenosis, lumbar region without neurogenic claudication: Secondary | ICD-10-CM | POA: Diagnosis not present

## 2021-06-01 DIAGNOSIS — G894 Chronic pain syndrome: Secondary | ICD-10-CM | POA: Diagnosis not present

## 2021-06-01 DIAGNOSIS — M25552 Pain in left hip: Secondary | ICD-10-CM | POA: Diagnosis not present

## 2021-06-01 DIAGNOSIS — M6281 Muscle weakness (generalized): Secondary | ICD-10-CM | POA: Diagnosis not present

## 2021-06-01 DIAGNOSIS — G43719 Chronic migraine without aura, intractable, without status migrainosus: Secondary | ICD-10-CM | POA: Diagnosis not present

## 2021-06-01 DIAGNOSIS — M7918 Myalgia, other site: Secondary | ICD-10-CM | POA: Diagnosis not present

## 2021-06-01 DIAGNOSIS — M546 Pain in thoracic spine: Secondary | ICD-10-CM | POA: Diagnosis not present

## 2021-06-01 DIAGNOSIS — M79671 Pain in right foot: Secondary | ICD-10-CM | POA: Diagnosis not present

## 2021-06-02 DIAGNOSIS — M48061 Spinal stenosis, lumbar region without neurogenic claudication: Secondary | ICD-10-CM | POA: Diagnosis not present

## 2021-06-02 DIAGNOSIS — M545 Low back pain, unspecified: Secondary | ICD-10-CM | POA: Diagnosis not present

## 2021-06-02 DIAGNOSIS — M25552 Pain in left hip: Secondary | ICD-10-CM | POA: Diagnosis not present

## 2021-06-02 DIAGNOSIS — G894 Chronic pain syndrome: Secondary | ICD-10-CM | POA: Diagnosis not present

## 2021-06-02 DIAGNOSIS — M542 Cervicalgia: Secondary | ICD-10-CM | POA: Diagnosis not present

## 2021-06-05 DIAGNOSIS — M0609 Rheumatoid arthritis without rheumatoid factor, multiple sites: Secondary | ICD-10-CM | POA: Diagnosis not present

## 2021-06-05 DIAGNOSIS — Z7960 Long term (current) use of unspecified immunomodulators and immunosuppressants: Secondary | ICD-10-CM | POA: Diagnosis not present

## 2021-06-05 DIAGNOSIS — E559 Vitamin D deficiency, unspecified: Secondary | ICD-10-CM | POA: Diagnosis not present

## 2021-06-05 DIAGNOSIS — M5136 Other intervertebral disc degeneration, lumbar region: Secondary | ICD-10-CM | POA: Diagnosis not present

## 2021-06-13 DIAGNOSIS — J449 Chronic obstructive pulmonary disease, unspecified: Secondary | ICD-10-CM | POA: Diagnosis not present

## 2021-06-13 DIAGNOSIS — I10 Essential (primary) hypertension: Secondary | ICD-10-CM | POA: Diagnosis not present

## 2021-06-13 DIAGNOSIS — M47817 Spondylosis without myelopathy or radiculopathy, lumbosacral region: Secondary | ICD-10-CM | POA: Diagnosis not present

## 2021-06-19 DIAGNOSIS — E785 Hyperlipidemia, unspecified: Secondary | ICD-10-CM | POA: Diagnosis not present

## 2021-06-19 DIAGNOSIS — L03011 Cellulitis of right finger: Secondary | ICD-10-CM | POA: Diagnosis not present

## 2021-06-19 DIAGNOSIS — M5441 Lumbago with sciatica, right side: Secondary | ICD-10-CM | POA: Diagnosis not present

## 2021-06-19 DIAGNOSIS — G8929 Other chronic pain: Secondary | ICD-10-CM | POA: Diagnosis not present

## 2021-06-19 DIAGNOSIS — E538 Deficiency of other specified B group vitamins: Secondary | ICD-10-CM | POA: Diagnosis not present

## 2021-06-19 DIAGNOSIS — M5442 Lumbago with sciatica, left side: Secondary | ICD-10-CM | POA: Diagnosis not present

## 2021-06-19 DIAGNOSIS — R739 Hyperglycemia, unspecified: Secondary | ICD-10-CM | POA: Diagnosis not present

## 2021-06-22 DIAGNOSIS — M25552 Pain in left hip: Secondary | ICD-10-CM | POA: Diagnosis not present

## 2021-06-22 DIAGNOSIS — M48061 Spinal stenosis, lumbar region without neurogenic claudication: Secondary | ICD-10-CM | POA: Diagnosis not present

## 2021-06-22 DIAGNOSIS — G894 Chronic pain syndrome: Secondary | ICD-10-CM | POA: Diagnosis not present

## 2021-06-22 DIAGNOSIS — Z79891 Long term (current) use of opiate analgesic: Secondary | ICD-10-CM | POA: Diagnosis not present

## 2021-06-22 DIAGNOSIS — M79605 Pain in left leg: Secondary | ICD-10-CM | POA: Diagnosis not present

## 2021-06-22 DIAGNOSIS — M546 Pain in thoracic spine: Secondary | ICD-10-CM | POA: Diagnosis not present

## 2021-06-22 DIAGNOSIS — M47817 Spondylosis without myelopathy or radiculopathy, lumbosacral region: Secondary | ICD-10-CM | POA: Diagnosis not present

## 2021-06-22 DIAGNOSIS — M79671 Pain in right foot: Secondary | ICD-10-CM | POA: Diagnosis not present

## 2021-06-22 DIAGNOSIS — M7918 Myalgia, other site: Secondary | ICD-10-CM | POA: Diagnosis not present

## 2021-06-22 DIAGNOSIS — M6281 Muscle weakness (generalized): Secondary | ICD-10-CM | POA: Diagnosis not present

## 2021-06-22 DIAGNOSIS — M791 Myalgia, unspecified site: Secondary | ICD-10-CM | POA: Diagnosis not present

## 2021-07-24 DIAGNOSIS — M797 Fibromyalgia: Secondary | ICD-10-CM | POA: Diagnosis not present

## 2021-07-24 DIAGNOSIS — M069 Rheumatoid arthritis, unspecified: Secondary | ICD-10-CM | POA: Diagnosis not present

## 2021-07-24 DIAGNOSIS — M47817 Spondylosis without myelopathy or radiculopathy, lumbosacral region: Secondary | ICD-10-CM | POA: Diagnosis not present

## 2021-07-24 DIAGNOSIS — M79671 Pain in right foot: Secondary | ICD-10-CM | POA: Diagnosis not present

## 2021-07-24 DIAGNOSIS — G894 Chronic pain syndrome: Secondary | ICD-10-CM | POA: Diagnosis not present

## 2021-07-24 DIAGNOSIS — S92334A Nondisplaced fracture of third metatarsal bone, right foot, initial encounter for closed fracture: Secondary | ICD-10-CM | POA: Diagnosis not present

## 2021-07-24 DIAGNOSIS — M48061 Spinal stenosis, lumbar region without neurogenic claudication: Secondary | ICD-10-CM | POA: Diagnosis not present

## 2021-07-24 DIAGNOSIS — S92344A Nondisplaced fracture of fourth metatarsal bone, right foot, initial encounter for closed fracture: Secondary | ICD-10-CM | POA: Diagnosis not present

## 2021-07-25 DIAGNOSIS — S92334A Nondisplaced fracture of third metatarsal bone, right foot, initial encounter for closed fracture: Secondary | ICD-10-CM | POA: Diagnosis not present

## 2021-07-31 DIAGNOSIS — M542 Cervicalgia: Secondary | ICD-10-CM | POA: Diagnosis not present

## 2021-07-31 DIAGNOSIS — G894 Chronic pain syndrome: Secondary | ICD-10-CM | POA: Diagnosis not present

## 2021-07-31 DIAGNOSIS — Z79899 Other long term (current) drug therapy: Secondary | ICD-10-CM | POA: Diagnosis not present

## 2021-07-31 DIAGNOSIS — Z5181 Encounter for therapeutic drug level monitoring: Secondary | ICD-10-CM | POA: Diagnosis not present

## 2021-07-31 DIAGNOSIS — M79605 Pain in left leg: Secondary | ICD-10-CM | POA: Diagnosis not present

## 2021-07-31 DIAGNOSIS — M48061 Spinal stenosis, lumbar region without neurogenic claudication: Secondary | ICD-10-CM | POA: Diagnosis not present

## 2021-07-31 DIAGNOSIS — M25552 Pain in left hip: Secondary | ICD-10-CM | POA: Diagnosis not present

## 2021-08-17 DIAGNOSIS — S92301D Fracture of unspecified metatarsal bone(s), right foot, subsequent encounter for fracture with routine healing: Secondary | ICD-10-CM | POA: Diagnosis not present

## 2021-08-17 DIAGNOSIS — M79671 Pain in right foot: Secondary | ICD-10-CM | POA: Diagnosis not present

## 2021-09-18 DIAGNOSIS — R748 Abnormal levels of other serum enzymes: Secondary | ICD-10-CM | POA: Diagnosis not present

## 2021-09-18 DIAGNOSIS — R197 Diarrhea, unspecified: Secondary | ICD-10-CM | POA: Diagnosis not present

## 2021-09-18 DIAGNOSIS — K2100 Gastro-esophageal reflux disease with esophagitis, without bleeding: Secondary | ICD-10-CM | POA: Diagnosis not present

## 2021-10-04 ENCOUNTER — Ambulatory Visit: Payer: Medicare Other | Admitting: Dermatology

## 2021-10-04 ENCOUNTER — Other Ambulatory Visit: Payer: Self-pay

## 2021-10-04 DIAGNOSIS — L578 Other skin changes due to chronic exposure to nonionizing radiation: Secondary | ICD-10-CM

## 2021-10-04 DIAGNOSIS — L820 Inflamed seborrheic keratosis: Secondary | ICD-10-CM | POA: Diagnosis not present

## 2021-10-04 DIAGNOSIS — D485 Neoplasm of uncertain behavior of skin: Secondary | ICD-10-CM

## 2021-10-04 DIAGNOSIS — C44519 Basal cell carcinoma of skin of other part of trunk: Secondary | ICD-10-CM | POA: Diagnosis not present

## 2021-10-04 DIAGNOSIS — C4491 Basal cell carcinoma of skin, unspecified: Secondary | ICD-10-CM

## 2021-10-04 DIAGNOSIS — C44619 Basal cell carcinoma of skin of left upper limb, including shoulder: Secondary | ICD-10-CM

## 2021-10-04 HISTORY — DX: Basal cell carcinoma of skin, unspecified: C44.91

## 2021-10-04 NOTE — Patient Instructions (Signed)
Wound Care Instructions  Cleanse wound gently with soap and water once a day then pat dry with clean gauze. Apply a thing coat of Petrolatum (petroleum jelly, "Vaseline") over the wound (unless you have an allergy to this). We recommend that you use a new, sterile tube of Vaseline. Do not pick or remove scabs. Do not remove the yellow or white "healing tissue" from the base of the wound.  Cover the wound with fresh, clean, nonstick gauze and secure with paper tape. You may use Band-Aids in place of gauze and tape if the would is small enough, but would recommend trimming much of the tape off as there is often too much. Sometimes Band-Aids can irritate the skin.  You should call the office for your biopsy report after 1 week if you have not already been contacted.  If you experience any problems, such as abnormal amounts of bleeding, swelling, significant bruising, significant pain, or evidence of infection, please call the office immediately.  FOR ADULT SURGERY PATIENTS: If you need something for pain relief you may take 1 extra strength Tylenol (acetaminophen) AND 2 Ibuprofen (200mg  each) together every 4 hours as needed for pain. (do not take these if you are allergic to them or if you have a reason you should not take them.) Typically, you may only need pain medication for 1 to 3 days.     Cryotherapy Aftercare  Wash gently with soap and water everyday.   Apply Vaseline and Band-Aid daily until healed.    If You Need Anything After Your Visit  If you have any questions or concerns for your doctor, please call our main line at [phone] and press option 4 to reach your doctor's medical assistant. If no one answers, please leave a voicemail as directed and we will return your call as soon as possible. Messages left after 4 pm will be answered the following business day.   You may also send [place] a message via [place]. We typically respond to MyChart messages within 1-2 business days.  For  prescription refills, please ask your pharmacy to contact our office. Our fax number is [phone].  If you have an urgent issue when the clinic is closed that cannot wait until the next business day, you can page your doctor at the number below.    Please note that while we do our best to be available for urgent issues outside of office hours, we are not available 24/7.   If you have an urgent issue and are unable to reach [place], you may choose to seek medical care at your doctor's office, retail clinic, urgent care center, or emergency room.  If you have a medical emergency, please immediately call 911 or go to the emergency department.  Pager Numbers  - Dr. Nehemiah Massed: [phone]  - Dr. Laurence Ferrari: [phone]  - Dr. Nicole Kindred: [phone]  In the event of inclement weather, please call our main line at [phone] for an update on the status of any delays or closures.  Dermatology Medication Tips: Please keep the boxes that topical medications come in in order to help keep track of the instructions about where and how to use these. Pharmacies typically print the medication instructions only on the boxes and not directly on the medication tubes.   If your medication is too expensive, please contact our office at [phone] option 4 or send [place] a message through [place].   We are unable to tell what your co-pay for medications will be in advance as  this is different depending on your insurance coverage. However, we may be able to find a substitute medication at lower cost or fill out paperwork to get insurance to cover a needed medication.   If a prior authorization is required to get your medication covered by your insurance company, please allow [place] 1-2 business days to complete this process.  Drug prices often vary depending on where the prescription is filled and some pharmacies may offer cheaper prices.  The website www.goodrx.com contains coupons for medications through different  pharmacies. The prices here do not account for what the cost may be with help from insurance (it may be cheaper with your insurance), but the website can give you the price if you did not use any insurance.  - You can print the associated coupon and take it with your prescription to the pharmacy.  - You may also stop by our office during regular business hours and pick up a GoodRx coupon card.  - If you need your prescription sent electronically to a different pharmacy, notify our office through [facility] or by phone at [phone] option 4.     Si Usted Necesita Algo Despus de Su Visita  Tambin puede enviarnos un mensaje a travs de Pharmacist, community. Por lo general respondemos a los mensajes de MyChart en el transcurso de 1 a 2 das hbiles.  Para renovar recetas, por favor pida a su farmacia que se ponga en contacto con nuestra oficina. Harland Dingwall de fax es [place] [phone].  Si tiene un asunto urgente cuando la clnica est cerrada y que no puede esperar hasta el siguiente da hbil, puede llamar/localizar a su doctor(a) al nmero que aparece a continuacin.   Por favor, tenga en cuenta que aunque hacemos todo lo posible para estar disponibles para asuntos urgentes fuera del horario de [place], no estamos disponibles las 24 horas del da, los 7 das de la [place].   Si tiene un problema urgente y no puede comunicarse con nosotros, puede optar por buscar atencin mdica  en el consultorio de su doctor(a), en una clnica privada, en un centro de atencin urgente o en una sala de emergencias.  Si tiene Engineering geologist, por favor llame inmediatamente al 911 o vaya a la sala de emergencias.  Nmeros de bper  - Dr. Nehemiah Massed: [phone]  - Dra. Moye: [phone]  - Dra. Nicole Kindred: [phone]  En caso de inclemencias del [place], por favor llame a Johnsie Kindred principal al [phone] para una actualizacin sobre el [place] de cualquier retraso o cierre.  Consejos para la  medicacin en dermatologa: Por favor, guarde las cajas en las que vienen los medicamentos de uso tpico para ayudarle a seguir las instrucciones sobre dnde y cmo usarlos. Las farmacias generalmente imprimen las instrucciones del medicamento slo en las cajas y no directamente en los tubos del [place].   Si su medicamento es muy caro, por favor, pngase en contacto con Zigmund Daniel llamando al [phone] y presione la opcin 4 o envenos un mensaje a travs de Pharmacist, community.   No podemos decirle cul ser su copago por los medicamentos por adelantado ya que esto es diferente dependiendo de la cobertura de su seguro. Sin embargo, es posible que podamos encontrar un medicamento sustituto a Electrical engineer un formulario para que el seguro cubra el medicamento que se considera necesario.   Si se requiere una autorizacin previa para que su compaa de seguros [place] su medicamento, por favor permtanos de 1 a 2 das hbiles  para Education officer, community.  Los precios de los medicamentos varan con frecuencia dependiendo del Environmental consultant de dnde se surte la receta y alguna farmacias pueden ofrecer precios ms baratos.  El sitio web www.goodrx.com tiene cupones para medicamentos de Airline pilot. Los precios aqu no tienen en cuenta lo que podra costar con la ayuda del seguro (puede ser ms barato con su seguro), pero el sitio web puede darle el precio si no utiliz Research scientist (physical sciences).  - Puede imprimir el cupn correspondiente y llevarlo con su receta a la farmacia.  - Tambin puede pasar por nuestra oficina durante el horario de atencin regular y Charity fundraiser una tarjeta de cupones de GoodRx.  - Si necesita que su receta se enve electrnicamente a una farmacia diferente, informe a nuestra oficina a travs de MyChart de [place] o por telfono llamando al [phone] y presione la opcin 4.

## 2021-10-04 NOTE — Progress Notes (Signed)
New Patient Visit  Subjective  Abigail [name] is a 56 y.o. female who presents for the following: Other (New patient - Spot of left arm and right axilla that get red and irritated). The patient has spots, moles and lesions to be evaluated, some may be new or changing and the patient has concerns that these could be cancer.  The following portions of the chart were reviewed this encounter and updated as appropriate:   Tobacco   Allergies   Meds   Problems   Med Hx   Surg Hx   Fam Hx      Review of Systems:  No other skin or systemic complaints except as noted in HPI or Assessment and Plan.  Objective  Well appearing patient in no apparent distress; mood and affect are within normal limits.  A focused examination was performed including face, arms. Relevant physical exam findings are noted in the Assessment and Plan.  Right Temple x 1, right medial cheek x 1 (2) Erythematous stuck-on, waxy papule or plaque  Left antecubital 1.0 x 0.5 cm pink scaly patch     Right Axilla 1.5 x 0.5 cm pink scaly patch      Assessment & Plan   Actinic Damage - chronic, secondary to cumulative UV radiation exposure/sun exposure over time - diffuse scaly erythematous macules with underlying dyspigmentation - Recommend daily broad spectrum sunscreen SPF 30+ to sun-exposed areas, reapply every 2 hours as needed.  - Recommend staying in the shade or wearing long sleeves, sun glasses (UVA+UVB protection) and wide brim hats (4-inch brim around the entire circumference of the hat). - Call for new or changing lesions.  Inflamed seborrheic keratosis (2) Right Temple x 1, right medial cheek x 1  Destruction of lesion - Right Temple x 1, right medial cheek x 1 Complexity: simple   Destruction method: cryotherapy   Informed consent: discussed and consent obtained   Timeout:  patient name, date of birth, surgical site, and procedure verified Lesion destroyed using liquid nitrogen: Yes    Region frozen until ice ball extended beyond lesion: Yes   Outcome: patient tolerated procedure well with no complications   Post-procedure details: wound care instructions given    Neoplasm of uncertain behavior of skin (2) Left antecubital  Skin / nail biopsy Type of biopsy: tangential   Informed consent: discussed and consent obtained   Patient was prepped and draped in usual sterile fashion: Area prepped with alcohol. Anesthesia: the lesion was anesthetized in a standard fashion   Anesthetic:  1% lidocaine w/ epinephrine 1-100,000 buffered w/ 8.4% NaHCO3 Instrument used: flexible razor blade   Hemostasis achieved with: pressure, aluminum chloride and electrodesiccation   Outcome: patient tolerated procedure well   Post-procedure details: wound care instructions given   Post-procedure details comment:  Ointment and small bandage applied  Specimen 1 - Surgical pathology Differential Diagnosis: BCC vs SCC vs other Check Margins: No  Right Axilla  Skin / nail biopsy Type of biopsy: tangential   Informed consent: discussed and consent obtained   Patient was prepped and draped in usual sterile fashion: Area prepped with alcohol. Anesthesia: the lesion was anesthetized in a standard fashion   Anesthetic:  1% lidocaine w/ epinephrine 1-100,000 buffered w/ 8.4% NaHCO3 Instrument used: flexible razor blade   Hemostasis achieved with: pressure, aluminum chloride and electrodesiccation   Outcome: patient tolerated procedure well   Post-procedure details: wound care instructions given   Post-procedure details comment:  Ointment and small bandage applied  Specimen 2 - Surgical pathology Differential Diagnosis: BCC vs SCC vs other  Check Margins: No   Return Pending biopsy results.  I, Ashok Cordia, CMA, am acting as scribe for Sarina Ser, MD . Documentation: I have reviewed the above documentation for accuracy and completeness, and I agree with the above.  Sarina Ser,  MD

## 2021-10-07 ENCOUNTER — Encounter: Payer: Self-pay | Admitting: Dermatology

## 2021-10-10 ENCOUNTER — Telehealth: Payer: Self-pay

## 2021-10-10 NOTE — Telephone Encounter (Signed)
Patient informed of pathology results and appt scheduled.

## 2021-10-10 NOTE — Telephone Encounter (Signed)
-----   Message from Ralene Bathe, MD sent at 10/06/2021  5:46 PM EST ----- Diagnosis 1. 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 Skin , right axilla BASAL CELL CARCINOMA, NODULAR PATTERN  1- Cancer - BCC Schedule surgery 2- Cancer - BCC Schedule surgery

## 2021-10-27 ENCOUNTER — Other Ambulatory Visit: Payer: Self-pay

## 2021-10-27 DIAGNOSIS — R42 Dizziness and giddiness: Secondary | ICD-10-CM

## 2021-11-14 ENCOUNTER — Other Ambulatory Visit: Payer: Self-pay

## 2021-11-14 ENCOUNTER — Encounter: Payer: Self-pay | Admitting: Dermatology

## 2021-11-14 ENCOUNTER — Ambulatory Visit (INDEPENDENT_AMBULATORY_CARE_PROVIDER_SITE_OTHER): Payer: Medicare Other | Admitting: Dermatology

## 2021-11-14 DIAGNOSIS — C44619 Basal cell carcinoma of skin of left upper limb, including shoulder: Secondary | ICD-10-CM | POA: Diagnosis not present

## 2021-11-14 DIAGNOSIS — C44612 Basal cell carcinoma of skin of right upper limb, including shoulder: Secondary | ICD-10-CM

## 2021-11-14 MED ORDER — DOXYCYCLINE HYCLATE 100 MG PO TABS: 100.0000 mg | ORAL_TABLET | Freq: Two times a day (BID) | ORAL | 0 refills | Status: DC

## 2021-11-14 MED ORDER — MUPIROCIN 2 % EX OINT: 1.0000 "application " | TOPICAL_OINTMENT | Freq: Every day | CUTANEOUS | 0 refills | Status: DC

## 2021-11-14 NOTE — Progress Notes (Signed)
? ?  Follow-Up Visit ?  ?Subjective  ?Abigail [name] is a 56 y.o. female who presents for the following: Basal Cell Carcinoma (Biopsy proven BCC of left antecubital - [facility] today). ? ?The following portions of the chart were reviewed this encounter and updated as appropriate:  ? Tobacco  Allergies  Meds  Problems  Med Hx  Surg Hx  Fam Hx   ?  ?Review of Systems:  No other skin or systemic complaints except as noted in HPI or Assessment and Plan. ? ?Objective  ?Well appearing patient in no apparent distress; mood and affect are within normal limits. ? ?A focused examination was performed including left arm. Relevant physical exam findings are noted in the Assessment and Plan. ? ?Left Antecubital Fossa ?Healing biopsy site ? ?Right Axilla ?Healing biopsy site ? ? ?Assessment & Plan  ?Basal cell carcinoma (BCC) of skin of left upper extremity including shoulder ?Left Antecubital Fossa ? ?Skin excision ? ?Lesion length (cm):  2.2 ?Lesion width (cm):  1.2 ?Margin per side (cm):  0.2 ?Total excision diameter (cm):  2.6 ?Informed consent: discussed and consent obtained   ?Timeout: patient name, date of birth, surgical site, and procedure verified   ?Procedure prep:  Patient was prepped and draped in usual sterile fashion ?Prep type:  Isopropyl alcohol and povidone-iodine ?Anesthesia: the lesion was anesthetized in a standard fashion   ?Anesthetic:  1% lidocaine w/ epinephrine 1-100,000 buffered w/ 8.4% NaHCO3 ?Instrument used: #15 blade   ?Hemostasis achieved with: pressure   ?Hemostasis achieved with comment:  Electrocautery ?Outcome: patient tolerated procedure well with no complications   ?Post-procedure details: sterile dressing applied and wound care instructions given   ?Dressing type: bandage and pressure dressing (mupirocin)   ? ?Skin repair ?Complexity:  Complex ?Final length (cm):  5 ?Reason for type of repair: reduce tension to allow closure, reduce the risk of dehiscence, infection, and  necrosis, reduce subcutaneous dead space and avoid a hematoma, allow closure of the large defect, preserve normal anatomy, preserve normal anatomical and functional relationships and enhance both functionality and cosmetic results   ?Undermining: area extensively undermined   ?Undermining comment:  Undermining defect 1.6 cm ?Subcutaneous layers (deep stitches):  ?Suture size:  3-0 ?Suture type: Vicryl (polyglactin 910)   ?Subcutaneous suture technique: inverted dermal. ?Fine/surface layer approximation (top stitches):  ?Suture size:  3-0 ?Suture type: nylon   ?Stitches: simple running   ?Suture removal (days):  7 ?Hemostasis achieved with: suture and pressure ?Outcome: patient tolerated procedure well with no complications   ?Post-procedure details: sterile dressing applied and wound care instructions given   ?Dressing type: bandage and pressure dressing (mupirocin)   ? ?mupirocin ointment (BACTROBAN) 2 % ?Apply 1 application. topically daily. With dressing changes ? ?doxycycline (VIBRA-TABS) 100 MG tablet ?Take 1 tablet (100 mg total) by mouth 2 (two) times daily. With food and plenty of fluid ? ?Specimen 1 - Surgical pathology ?Differential Diagnosis: Biopsy proven BCC ?Check Margins: Yes ?[id number] ? ?Start Doxycycline 100 mg 1 po bid with food and plenty of fluid ? ?Basal cell carcinoma (BCC) of skin of right upper extremity including shoulder ?Right Axilla ? ?Will plan excision 11/28/2021 ? ? ?Return in about 1 week (around 11/21/2021) for suture removal. ? ?I, Ashok Cordia, CMA, am acting as scribe for Sarina Ser, MD . ?Documentation: I have reviewed the above documentation for accuracy and completeness, and I agree with the above. ? ?Sarina Ser, MD ? ?

## 2021-11-14 NOTE — Patient Instructions (Signed)
Wound Care Instructions ? ?On the day following your surgery, you should begin doing daily dressing changes: ?Remove the old dressing and discard it. ?Cleanse the wound gently with tap water. This may be done in the shower or by placing a wet gauze pad directly on the wound and letting it soak for several minutes. ?It is important to gently remove any dried blood from the wound in order to encourage healing. This may be done by gently rolling a moistened Q-tip on the dried blood. Do not pick at the wound. ?If the wound should start to bleed, continue cleaning the wound, then place a moist gauze pad on the wound and hold pressure for a few minutes.  ?Make sure you then dry the skin surrounding the wound completely or the tape will not stick to the skin. Do not use cotton balls on the wound. ?After the wound is clean and dry, apply the ointment gently with a Q-tip. ?Cut a non-stick pad to fit the size of the wound. Lay the pad flush to the wound. If the wound is draining, you may want to reinforce it with a small amount of gauze on top of the non-stick pad for a little added compression to the area. ?Use the tape to seal the area completely. ?Select from the following with respect to your individual situation: ?If your wound has been stitched closed: continue the above steps 1-8 at least daily until your sutures are removed. ?If your wound has been left open to heal: continue steps 1-8 at least daily for the first 3-4 weeks. ?We would like for you to take a few extra precautions for at least the next week. ?Sleep with your head elevated on pillows if our wound is on your head. ?Do not bend over or lift heavy items to reduce the chance of elevated blood pressure to the wound ?Do not participate in particularly strenuous activities. ? ? ?Below is a list of dressing supplies you might need.  ?Cotton-tipped applicators - Q-tips ?Gauze pads (2x2 and/or 4x4) - All-Purpose Sponges ?Non-stick dressing material - Telfa ?Tape -  Paper or Hypafix ?New and clean tube of petroleum jelly - Vaseline  ? ? ?Comments on Post-Operative Period ?Slight swelling and redness often appear around the wound. This is normal and will disappear within several days following the surgery. ?The healing wound will drain a brownish-red-yellow discharge during healing. This is a normal phase of wound healing. As the wound begins to heal, the drainage may increase in amount. Again, this drainage is normal. ?Notify us if the drainage becomes persistently bloody, excessively swollen, or intensely painful or develops a foul odor or red streaks.  ?If you should experience mild discomfort during the healing phase, you may take an aspirin-free medication such as Tylenol (acetaminophen). Notify us if the discomfort is severe or persistent. Avoid alcoholic beverages when taking pain medicine. ? ?In Case of Wound Hemorrhage ?A wound hemorrhage is when the bandage suddenly becomes soaked with bright red blood and flows profusely. If this happens, sit down or lie down with your head elevated. If the wound has a dressing on it, do not remove the dressing. Apply pressure to the existing gauze. If the wound is not covered, use a gauze pad to apply pressure and continue applying the pressure for 20 minutes without peeking. DO NOT COVER THE WOUND WITH A LARGE TOWEL OR WASH CLOTH. Release your hand from the wound site but do not remove the dressing. If the bleeding has stopped,   gently clean around the wound. Leave the dressing in place for 24 hours if possible. This wait time allows the blood vessels to close off so that you do not spark a new round of bleeding by disrupting the newly clotted blood vessels with an immediate dressing change. If the bleeding does not subside, continue to hold pressure. If matters are out of your control, contact an After Hours clinic or go to the Emergency Room. ? ? ? ?If You Need Anything After Your Visit ? ?If you have any questions or concerns for  your doctor, please call our main line at [phone] and press option 4 to reach your doctor's medical assistant. If no one answers, please leave a voicemail as directed and we will return your call as soon as possible. Messages left after 4 pm will be answered the following business day.  ? ?You may also send [place] a message via MyChart. We typically respond to MyChart messages within 1-2 business days. ? ?For prescription refills, please ask your pharmacy to contact our office. Our fax number is [phone]. ? ?If you have an urgent issue when the clinic is closed that cannot wait until the next business day, you can page your doctor at the number below.   ? ?Please note that while we do our best to be available for urgent issues outside of office hours, we are not available 24/7.  ? ?If you have an urgent issue and are unable to reach [place], you may choose to seek medical care at your doctor's office, retail clinic, urgent care center, or emergency room. ? ?If you have a medical emergency, please immediately call 911 or go to the emergency department. ? ?Pager Numbers ? ?- Dr. Nehemiah Massed: [phone] ? ?- Dr. Laurence Ferrari: [phone] ? ?- Dr. Nicole Kindred: [phone] ? ?In the event of inclement weather, please call our main line at [phone] for an update on the status of any delays or closures. ? ?Dermatology Medication Tips: ?Please keep the boxes that topical medications come in in order to help keep track of the instructions about where and how to use these. Pharmacies typically print the medication instructions only on the boxes and not directly on the medication tubes.  ? ?If your medication is too expensive, please contact our office at [phone] option 4 or send [place] a message through [place].  ? ?We are unable to tell what your co-pay for medications will be in advance as this is different depending on your insurance coverage. However, we may be able to find a substitute medication at lower cost or fill out  paperwork to get insurance to cover a needed medication.  ? ?If a prior authorization is required to get your medication covered by your insurance company, please allow [place] 1-2 business days to complete this process. ? ?Drug prices often vary depending on where the prescription is filled and some pharmacies may offer cheaper prices. ? ?The website www.goodrx.com contains coupons for medications through different pharmacies. The prices here do not account for what the cost may be with help from insurance (it may be cheaper with your insurance), but the website can give you the price if you did not use any insurance.  ?- You can print the associated coupon and take it with your prescription to the pharmacy.  ?- You may also stop by our office during regular business hours and pick up a GoodRx coupon card.  ?- If you need your prescription sent electronically to a different pharmacy, notify our  office through [facility] or by phone at [phone] option 4. ? ? ? ? ?Si Usted Necesita Algo Despu?s de Su Visita ? ?Tambi?n puede enviarnos un mensaje a trav?s de MyChart. Por lo general respondemos a los mensajes de MyChart en el transcurso de 1 a 2 d?as h?biles. ? ?Para renovar recetas, por favor pida a su farmacia que se ponga en contacto con nuestra oficina. Nuestro n?mero de fax es el [phone]. ? ?Si tiene un asunto urgente cuando la cl?nica est? cerrada y que no puede esperar hasta el siguiente d?a h?bil, puede llamar/localizar a su doctor(a) al n?mero que aparece a continuaci?n.  ? ?Por favor, tenga en cuenta que aunque hacemos todo lo posible para estar disponibles para asuntos urgentes fuera del horario de oficina, no estamos disponibles las 24 horas del d?a, los 7 d?as de la semana.  ? ?Si tiene un problema urgente y no puede comunicarse con nosotros, puede optar por buscar atenci?n m?dica  en el consultorio de su doctor(a), en una cl?nica privada, en un centro de atenci?n urgente o en una sala de  emergencias. ? ?Si tiene Engineer, maintenance (IT) m?dica, por favor llame inmediatamente al 911 o vaya a la sala de emergencias. ? ?N?meros de b?per ? ?- Dr. Nehemiah Massed: [phone] ? ?- Dra. Moye: [phone] ? ?- Dra

## 2021-11-16 ENCOUNTER — Encounter: Payer: Self-pay | Admitting: Dermatology

## 2021-11-21 ENCOUNTER — Ambulatory Visit: Payer: Medicare Other | Admitting: Dermatology

## 2021-11-23 ENCOUNTER — Ambulatory Visit: Payer: Medicare Other | Admitting: Dermatology

## 2021-11-28 ENCOUNTER — Telehealth: Payer: Self-pay

## 2021-11-28 ENCOUNTER — Encounter: Payer: Self-pay | Admitting: Dermatology

## 2021-11-28 ENCOUNTER — Ambulatory Visit: Payer: Medicare Other | Admitting: Dermatology

## 2021-11-28 DIAGNOSIS — C44619 Basal cell carcinoma of skin of left upper limb, including shoulder: Secondary | ICD-10-CM

## 2021-11-28 DIAGNOSIS — C44612 Basal cell carcinoma of skin of right upper limb, including shoulder: Secondary | ICD-10-CM | POA: Diagnosis not present

## 2021-11-28 NOTE — Patient Instructions (Signed)
Wound Care Instructions ? ?On the day following your surgery, you should begin doing daily dressing changes: ?Remove the old dressing and discard it. ?Cleanse the wound gently with tap water. This may be done in the shower or by placing a wet gauze pad directly on the wound and letting it soak for several minutes. ?It is important to gently remove any dried blood from the wound in order to encourage healing. This may be done by gently rolling a moistened Q-tip on the dried blood. Do not pick at the wound. ?If the wound should start to bleed, continue cleaning the wound, then place a moist gauze pad on the wound and hold pressure for a few minutes.  ?Make sure you then dry the skin surrounding the wound completely or the tape will not stick to the skin. Do not use cotton balls on the wound. ?After the wound is clean and dry, apply the ointment gently with a Q-tip. ?Cut a non-stick pad to fit the size of the wound. Lay the pad flush to the wound. If the wound is draining, you may want to reinforce it with a small amount of gauze on top of the non-stick pad for a little added compression to the area. ?Use the tape to seal the area completely. ?Select from the following with respect to your individual situation: ?If your wound has been stitched closed: continue the above steps 1-8 at least daily until your sutures are removed. ?If your wound has been left open to heal: continue steps 1-8 at least daily for the first 3-4 weeks. ?We would like for you to take a few extra precautions for at least the next week. ?Sleep with your head elevated on pillows if our wound is on your head. ?Do not bend over or lift heavy items to reduce the chance of elevated blood pressure to the wound ?Do not participate in particularly strenuous activities. ? ? ?Below is a list of dressing supplies you might need.  ?Cotton-tipped applicators - Q-tips ?Gauze pads (2x2 and/or 4x4) - All-Purpose Sponges ?Non-stick dressing material - Telfa ?Tape -  Paper or Hypafix ?New and clean tube of petroleum jelly - Vaseline  ? ? ?Comments on Post-Operative Period ?Slight swelling and redness often appear around the wound. This is normal and will disappear within several days following the surgery. ?The healing wound will drain a brownish-red-yellow discharge during healing. This is a normal phase of wound healing. As the wound begins to heal, the drainage may increase in amount. Again, this drainage is normal. ?Notify us if the drainage becomes persistently bloody, excessively swollen, or intensely painful or develops a foul odor or red streaks.  ?If you should experience mild discomfort during the healing phase, you may take an aspirin-free medication such as Tylenol (acetaminophen). Notify us if the discomfort is severe or persistent. Avoid alcoholic beverages when taking pain medicine. ? ?In Case of Wound Hemorrhage ?A wound hemorrhage is when the bandage suddenly becomes soaked with bright red blood and flows profusely. If this happens, sit down or lie down with your head elevated. If the wound has a dressing on it, do not remove the dressing. Apply pressure to the existing gauze. If the wound is not covered, use a gauze pad to apply pressure and continue applying the pressure for 20 minutes without peeking. DO NOT COVER THE WOUND WITH A LARGE TOWEL OR WASH CLOTH. Release your hand from the wound site but do not remove the dressing. If the bleeding has stopped,   gently clean around the wound. Leave the dressing in place for 24 hours if possible. This wait time allows the blood vessels to close off so that you do not spark a new round of bleeding by disrupting the newly clotted blood vessels with an immediate dressing change. If the bleeding does not subside, continue to hold pressure. If matters are out of your control, contact an After Hours clinic or go to the Emergency Room. ? ? ? ?If You Need Anything After Your Visit ? ?If you have any questions or concerns for  your doctor, please call our main line at [phone] and press option 4 to reach your doctor's medical assistant. If no one answers, please leave a voicemail as directed and we will return your call as soon as possible. Messages left after 4 pm will be answered the following business day.  ? ?You may also send [place] a message via MyChart. We typically respond to MyChart messages within 1-2 business days. ? ?For prescription refills, please ask your pharmacy to contact our office. Our fax number is [phone]. ? ?If you have an urgent issue when the clinic is closed that cannot wait until the next business day, you can page your doctor at the number below.   ? ?Please note that while we do our best to be available for urgent issues outside of office hours, we are not available 24/7.  ? ?If you have an urgent issue and are unable to reach [place], you may choose to seek medical care at your doctor's office, retail clinic, urgent care center, or emergency room. ? ?If you have a medical emergency, please immediately call 911 or go to the emergency department. ? ?Pager Numbers ? ?- Dr. Nehemiah Massed: [phone] ? ?- Dr. Laurence Ferrari: [phone] ? ?- Dr. Nicole Kindred: [phone] ? ?In the event of inclement weather, please call our main line at [phone] for an update on the status of any delays or closures. ? ?Dermatology Medication Tips: ?Please keep the boxes that topical medications come in in order to help keep track of the instructions about where and how to use these. Pharmacies typically print the medication instructions only on the boxes and not directly on the medication tubes.  ? ?If your medication is too expensive, please contact our office at [phone] option 4 or send [place] a message through [place].  ? ?We are unable to tell what your co-pay for medications will be in advance as this is different depending on your insurance coverage. However, we may be able to find a substitute medication at lower cost or fill out  paperwork to get insurance to cover a needed medication.  ? ?If a prior authorization is required to get your medication covered by your insurance company, please allow [place] 1-2 business days to complete this process. ? ?Drug prices often vary depending on where the prescription is filled and some pharmacies may offer cheaper prices. ? ?The website www.goodrx.com contains coupons for medications through different pharmacies. The prices here do not account for what the cost may be with help from insurance (it may be cheaper with your insurance), but the website can give you the price if you did not use any insurance.  ?- You can print the associated coupon and take it with your prescription to the pharmacy.  ?- You may also stop by our office during regular business hours and pick up a GoodRx coupon card.  ?- If you need your prescription sent electronically to a different pharmacy, notify our  office through [facility] or by phone at [phone] option 4. ? ? ? ? ?Si Usted Necesita Algo Despu?s de Su Visita ? ?Tambi?n puede enviarnos un mensaje a trav?s de MyChart. Por lo general respondemos a los mensajes de MyChart en el transcurso de 1 a 2 d?as h?biles. ? ?Para renovar recetas, por favor pida a su farmacia que se ponga en contacto con nuestra oficina. Nuestro n?mero de fax es el [phone]. ? ?Si tiene un asunto urgente cuando la cl?nica est? cerrada y que no puede esperar hasta el siguiente d?a h?bil, puede llamar/localizar a su doctor(a) al n?mero que aparece a continuaci?n.  ? ?Por favor, tenga en cuenta que aunque hacemos todo lo posible para estar disponibles para asuntos urgentes fuera del horario de oficina, no estamos disponibles las 24 horas del d?a, los 7 d?as de la semana.  ? ?Si tiene un problema urgente y no puede comunicarse con nosotros, puede optar por buscar atenci?n m?dica  en el consultorio de su doctor(a), en una cl?nica privada, en un centro de atenci?n urgente o en una sala de  emergencias. ? ?Si tiene Engineer, maintenance (IT) m?dica, por favor llame inmediatamente al 911 o vaya a la sala de emergencias. ? ?N?meros de b?per ? ?- Dr. Nehemiah Massed: [phone] ? ?- Dra. Moye: [phone] ? ?- Dra

## 2021-11-28 NOTE — Telephone Encounter (Signed)
Left pt msg to call if any problems after today's surgery./sh °

## 2021-11-28 NOTE — Progress Notes (Signed)
? ?Follow-Up Visit ?  ?Subjective  ?Abigail [name] is a 56 y.o. female who presents for the following: Basal Cell Carcinoma (Biopsy proven of right axilla - Excise today) and BCC margins free, bx proven (L antecubital fossa, 1 wk f/u, pt resents for suture removal). ? ?The following portions of the chart were reviewed this encounter and updated as appropriate:  ? Tobacco  Allergies  Meds  Problems  Med Hx  Surg Hx  Fam Hx   ?  ?Review of Systems:  No other skin or systemic complaints except as noted in HPI or Assessment and Plan. ? ?Objective  ?Well appearing patient in no apparent distress; mood and affect are within normal limits. ? ?A focused examination was performed including right axilla. Relevant physical exam findings are noted in the Assessment and Plan. ? ?Right Axilla ?Healing biopsy site 2.2 x 1.1cm ? ?Left Antecubital Fossa ?Healing excision site ? ? ?Assessment & Plan  ?Basal cell carcinoma (BCC) of skin of right upper extremity including shoulder ?Right Axilla ? ?Skin excision ? ?Lesion length (cm):  2.2 ?Lesion width (cm):  1.1 ?Margin per side (cm):  0.2 ?Total excision diameter (cm):  2.6 ?Informed consent: discussed and consent obtained   ?Timeout: patient name, date of birth, surgical site, and procedure verified   ?Procedure prep:  Patient was prepped and draped in usual sterile fashion ?Prep type:  Isopropyl alcohol and povidone-iodine ?Anesthesia: the lesion was anesthetized in a standard fashion   ?Anesthetic:  1% lidocaine w/ epinephrine 1-100,000 buffered w/ 8.4% NaHCO3 (6cc lido w/ epi, 6cc bupivicaine, Total of 12cc) ?Instrument used comment:  #15c blade ?Hemostasis achieved with: pressure   ?Hemostasis achieved with comment:  Electrocautery ?Outcome: patient tolerated procedure well with no complications   ?Post-procedure details: sterile dressing applied and wound care instructions given   ?Dressing type: bandage, pressure dressing and bacitracin (Mupirocin)   ? ?Skin  repair ?Complexity:  Complex ?Final length (cm):  5 ?Reason for type of repair: reduce tension to allow closure, reduce the risk of dehiscence, infection, and necrosis, reduce subcutaneous dead space and avoid a hematoma, allow closure of the large defect, preserve normal anatomy, preserve normal anatomical and functional relationships and enhance both functionality and cosmetic results   ?Undermining: area extensively undermined   ?Undermining comment:  Undermining Defect 2.6cm ?Subcutaneous layers (deep stitches):  ?Suture size:  3-0 ?Suture type: Vicryl (polyglactin 910)   ?Subcutaneous suture technique: Inverted Dermal. ?Fine/surface layer approximation (top stitches):  ?Suture size:  3-0 ?Suture type: nylon   ?Stitches: horizontal mattress   ?Suture removal (days):  7 ?Hemostasis achieved with: pressure ?Outcome: patient tolerated procedure well with no complications   ?Post-procedure details: sterile dressing applied and wound care instructions given   ?Dressing type: bandage, pressure dressing and bacitracin (Mupirocin)   ? ?Specimen 1 - Surgical pathology ?Differential Diagnosis: Biopsy proven BCC ?Check Margins: Yes ?[id number] ?Pink bx site 2.2 x 1.1cm ? ?Bx proven ?Excised today ?Start Mupirocin oint qd to excision site ? ?Basal cell carcinoma (BCC) of skin of left upper extremity including shoulder ?Left Antecubital Fossa ? ?Margins free, bx proven ? ?Encounter for Removal of Sutures ?- Incision site at the left antecubital fossa is clean, dry and intact ?- Wound cleansed, sutures removed, wound cleansed and steri strips applied.  ?- Discussed pathology results showing BCC margins free  ?- Patient advised to keep steri-strips dry until they fall off. ?- Scars remodel for a full year. ?- Once steri-strips fall off, patient can  apply over-the-counter silicone scar cream each night to help with scar remodeling if desired. ?- Patient advised to call with any concerns or if they notice any new or changing  lesions.  ? ?Related Medications ?mupirocin ointment (BACTROBAN) 2 % ?Apply 1 application. topically daily. With dressing changes ? ? ?Return in about 1 week (around 12/05/2021) for suture removal. ? ?I, Othelia Pulling, RMA, am acting as scribe for Sarina Ser, MD . ?Documentation: I have reviewed the above documentation for accuracy and completeness, and I agree with the above. ? ?Sarina Ser, MD ? ?

## 2021-12-04 ENCOUNTER — Encounter: Payer: Self-pay | Admitting: Dermatology

## 2021-12-05 ENCOUNTER — Ambulatory Visit: Payer: Medicare Other | Admitting: Dermatology

## 2021-12-06 ENCOUNTER — Encounter: Payer: Medicare Other | Admitting: Dermatology

## 2021-12-07 ENCOUNTER — Ambulatory Visit (INDEPENDENT_AMBULATORY_CARE_PROVIDER_SITE_OTHER): Payer: Medicare Other | Admitting: Dermatology

## 2021-12-07 DIAGNOSIS — L821 Other seborrheic keratosis: Secondary | ICD-10-CM

## 2021-12-07 DIAGNOSIS — Z4802 Encounter for removal of sutures: Secondary | ICD-10-CM

## 2021-12-07 DIAGNOSIS — Z85828 Personal history of other malignant neoplasm of skin: Secondary | ICD-10-CM

## 2021-12-07 NOTE — Patient Instructions (Signed)

## 2021-12-07 NOTE — Progress Notes (Signed)
? ?  Follow-Up Visit ?  ?Subjective  ?Abigail [name] is a 56 y.o. female who presents for the following: Basal Cell Carcinoma (Margins free of right axilla - Post op appointment). ? ?The following portions of the chart were reviewed this encounter and updated as appropriate:  ? Tobacco  Allergies  Meds  Problems  Med Hx  Surg Hx  Fam Hx   ?  ?Review of Systems:  No other skin or systemic complaints except as noted in HPI or Assessment and Plan. ? ?Objective  ?Well appearing patient in no apparent distress; mood and affect are within normal limits. ? ?A focused examination was performed including right axilla. Relevant physical exam findings are noted in the Assessment and Plan. ? ?Right Axilla ?Healing excision site ? ?Head - Anterior (Face) ?Stuck-on, waxy, tan-brown papule or plaque --Discussed benign etiology and prognosis.  ? ? ?Assessment & Plan  ?History of basal cell carcinoma (BCC) ?Right anterior Axilla ? ?Encounter for Removal of Sutures ?- Incision site at the right axilla is clean, dry and intact ?- Wound cleansed, sutures removed, wound cleansed and steri strips applied.  ?- Discussed pathology results showing BCC margins free  ?- Patient advised to keep steri-strips dry until they fall off. ?- Scars remodel for a full year. ?- Once steri-strips fall off, patient can apply over-the-counter silicone scar cream each night to help with scar remodeling if desired. ?- Patient advised to call with any concerns or if they notice any new or changing lesions. ? ? ?Seborrheic keratosis ?Head - Anterior (Face) ? ?Benign. Discussed treatment options. Recommend LN2 - advised patient fee is $60 for the first lesion and $15 each additional.  ? ?Follow-up June 2023 as scheduled for TBSE. ? ?I, Ashok Cordia, CMA, am acting as scribe for Sarina Ser, MD . ?Documentation: I have reviewed the above documentation for accuracy and completeness, and I agree with the above. ? ?Sarina Ser, MD ? ?

## 2021-12-16 ENCOUNTER — Encounter: Payer: Self-pay | Admitting: Dermatology

## 2021-12-19 ENCOUNTER — Telehealth: Payer: Self-pay

## 2021-12-19 NOTE — Telephone Encounter (Signed)
Pt LM on VM please call her to schedule a follow up appt, she has been sick and missed her appt  ? ?I called pt LM on her VM returning her call please call here  ?

## 2022-01-29 ENCOUNTER — Ambulatory Visit: Payer: Medicare Other | Admitting: Dermatology

## 2022-01-29 DIAGNOSIS — D229 Melanocytic nevi, unspecified: Secondary | ICD-10-CM

## 2022-01-29 DIAGNOSIS — L570 Actinic keratosis: Secondary | ICD-10-CM | POA: Diagnosis not present

## 2022-01-29 DIAGNOSIS — L821 Other seborrheic keratosis: Secondary | ICD-10-CM

## 2022-01-29 DIAGNOSIS — L820 Inflamed seborrheic keratosis: Secondary | ICD-10-CM | POA: Diagnosis not present

## 2022-01-29 DIAGNOSIS — L578 Other skin changes due to chronic exposure to nonionizing radiation: Secondary | ICD-10-CM

## 2022-01-29 DIAGNOSIS — Z85828 Personal history of other malignant neoplasm of skin: Secondary | ICD-10-CM

## 2022-01-29 DIAGNOSIS — L814 Other melanin hyperpigmentation: Secondary | ICD-10-CM

## 2022-01-29 DIAGNOSIS — Z1283 Encounter for screening for malignant neoplasm of skin: Secondary | ICD-10-CM

## 2022-01-29 DIAGNOSIS — D1800 Hemangioma unspecified site: Secondary | ICD-10-CM

## 2022-01-29 MED ORDER — MUPIROCIN 2 % EX OINT: 1.0000 "application " | TOPICAL_OINTMENT | Freq: Every day | CUTANEOUS | 0 refills | Status: DC

## 2022-01-29 NOTE — Patient Instructions (Addendum)
Seborrheic Keratosis  What causes seborrheic keratoses? Seborrheic keratoses are harmless, common skin growths that first appear during adult life.  As time goes by, more growths appear.  Some people may develop a large number of them.  Seborrheic keratoses appear on both covered and uncovered body parts.  They are not caused by sunlight.  The tendency to develop seborrheic keratoses can be inherited.  They vary in color from skin-colored to gray, brown, or even black.  They can be either smooth or have a rough, warty surface.   Seborrheic keratoses are superficial and look as if they were stuck on the skin.  Under the microscope this type of keratosis looks like layers upon layers of skin.  That is why at times the top layer may seem to fall off, but the rest of the growth remains and re-grows.    Treatment Seborrheic keratoses do not need to be treated, but can easily be removed in the office.  Seborrheic keratoses often cause symptoms when they rub on clothing or jewelry.  Lesions can be in the way of shaving.  If they become inflamed, they can cause itching, soreness, or burning.  Removal of a seborrheic keratosis can be accomplished by freezing, burning, or surgery. If any spot bleeds, scabs, or grows rapidly, please return to have it checked, as these can be an indication of a skin cancer.    Actinic keratoses are precancerous spots that appear secondary to cumulative UV radiation exposure/sun exposure over time. They are chronic with expected duration over 1 year. A portion of actinic keratoses will progress to squamous cell carcinoma of the skin. It is not possible to reliably predict which spots will progress to skin cancer and so treatment is recommended to prevent development of skin cancer.  Recommend daily broad spectrum sunscreen SPF 30+ to sun-exposed areas, reapply every 2 hours as needed.  Recommend staying in the shade or wearing long sleeves, sun glasses (UVA+UVB protection) and  wide brim hats (4-inch brim around the entire circumference of the hat). Call for new or changing lesions.   Cryotherapy Aftercare  Wash gently with soap and water everyday.   Apply Vaseline and Band-Aid daily until healed.   Melanoma ABCDEs  Melanoma is the most dangerous type of skin cancer, and is the leading cause of death from skin disease.  You are more likely to develop melanoma if you: Have light-colored skin, light-colored eyes, or red or blond hair Spend a lot of time in the sun Tan regularly, either outdoors or in a tanning bed Have had blistering sunburns, especially during childhood Have a close family member who has had a melanoma Have atypical moles or large birthmarks  Early detection of melanoma is key since treatment is typically straightforward and cure rates are extremely high if we catch it early.   The first sign of melanoma is often a change in a mole or a new dark spot.  The ABCDE system is a way of remembering the signs of melanoma.  A for asymmetry:  The two halves do not match. B for border:  The edges of the growth are irregular. C for color:  A mixture of colors are present instead of an even brown color. D for diameter:  Melanomas are usually (but not always) greater than 6mm - the size of a pencil eraser. E for evolution:  The spot keeps changing in size, shape, and color.  Please check your skin once per month between visits. You can use a   small mirror in front and a large mirror behind you to keep an eye on the back side or your body.   If you see any new or changing lesions before your next follow-up, please call to schedule a visit.  Please continue daily skin protection including broad spectrum sunscreen SPF 30+ to sun-exposed areas, reapplying every 2 hours as needed when you're outdoors.   Staying in the shade or wearing long sleeves, sun glasses (UVA+UVB protection) and wide brim hats (4-inch brim around the entire circumference of the hat) are  also recommended for sun protection.    Due to recent changes in healthcare laws, you may see results of your pathology and/or laboratory studies on MyChart before the doctors have had a chance to review them. We understand that in some cases there may be results that are confusing or concerning to you. Please understand that not all results are received at the same time and often the doctors may need to interpret multiple results in order to provide you with the best plan of care or course of treatment. Therefore, we ask that you please give us 2 business days to thoroughly review all your results before contacting the office for clarification. Should we see a critical lab result, you will be contacted sooner.   If You Need Anything After Your Visit  If you have any questions or concerns for your doctor, please call our main line at [phone] and press option 4 to reach your doctor's medical assistant. If no one answers, please leave a voicemail as directed and we will return your call as soon as possible. Messages left after 4 pm will be answered the following business day.   You may also send us a message via MyChart. We typically respond to MyChart messages within 1-2 business days.  For prescription refills, please ask your pharmacy to contact our office. Our fax number is [phone].  If you have an urgent issue when the clinic is closed that cannot wait until the next business day, you can page your doctor at the number below.    Please note that while we do our best to be available for urgent issues outside of office hours, we are not available 24/7.   If you have an urgent issue and are unable to reach us, you may choose to seek medical care at your doctor's office, retail clinic, urgent care center, or emergency room.  If you have a medical emergency, please immediately call 911 or go to the emergency department.  Pager Numbers  - Dr. Kowalski: [phone]  - Dr. Moye:  [phone]  - Dr. Stewart: [phone]  In the event of inclement weather, please call our main line at [phone] for an update on the status of any delays or closures.  Dermatology Medication Tips: Please keep the boxes that topical medications come in in order to help keep track of the instructions about where and how to use these. Pharmacies typically print the medication instructions only on the boxes and not directly on the medication tubes.   If your medication is too expensive, please contact our office at [phone] option 4 or send us a message through MyChart.   We are unable to tell what your co-pay for medications will be in advance as this is different depending on your insurance coverage. However, we may be able to find a substitute medication at lower cost or fill out paperwork to get insurance to cover a needed medication.   If a prior   authorization is required to get your medication covered by your insurance company, please allow us 1-2 business days to complete this process.  Drug prices often vary depending on where the prescription is filled and some pharmacies may offer cheaper prices.  The website www.goodrx.com contains coupons for medications through different pharmacies. The prices here do not account for what the cost may be with help from insurance (it may be cheaper with your insurance), but the website can give you the price if you did not use any insurance.  - You can print the associated coupon and take it with your prescription to the pharmacy.  - You may also stop by our office during regular business hours and pick up a GoodRx coupon card.  - If you need your prescription sent electronically to a different pharmacy, notify our office through [facility] MyChart or by phone at [phone] option 4.     Si Usted Necesita Algo Despus de Su Visita  Tambin puede enviarnos un mensaje a travs de MyChart. Por lo general respondemos a los mensajes de  MyChart en el transcurso de 1 a 2 das hbiles.  Para renovar recetas, por favor pida a su farmacia que se ponga en contacto con nuestra oficina. Nuestro nmero de fax es el [phone].  Si tiene un asunto urgente cuando la clnica est cerrada y que no puede esperar hasta el siguiente da hbil, puede llamar/localizar a su doctor(a) al nmero que aparece a continuacin.   Por favor, tenga en cuenta que aunque hacemos todo lo posible para estar disponibles para asuntos urgentes fuera del horario de oficina, no estamos disponibles las 24 horas del da, los 7 das de la semana.   Si tiene un problema urgente y no puede comunicarse con nosotros, puede optar por buscar atencin mdica  en el consultorio de su doctor(a), en una clnica privada, en un centro de atencin urgente o en una sala de emergencias.  Si tiene una emergencia mdica, por favor llame inmediatamente al 911 o vaya a la sala de emergencias.  Nmeros de bper  - Dr. Kowalski: [phone]  - Dra. Moye: [phone]  - Dra. Stewart: [phone]  En caso de inclemencias del tiempo, por favor llame a nuestra lnea principal al [phone] para una actualizacin sobre el estado de cualquier retraso o cierre.  Consejos para la medicacin en dermatologa: Por favor, guarde las cajas en las que vienen los medicamentos de uso tpico para ayudarle a seguir las instrucciones sobre dnde y cmo usarlos. Las farmacias generalmente imprimen las instrucciones del medicamento slo en las cajas y no directamente en los tubos del medicamento.   Si su medicamento es muy caro, por favor, pngase en contacto con nuestra oficina llamando al [phone] y presione la opcin 4 o envenos un mensaje a travs de MyChart.   No podemos decirle cul ser su copago por los medicamentos por adelantado ya que esto es diferente dependiendo de la cobertura de su seguro. Sin embargo, es posible que podamos encontrar un medicamento sustituto a menor costo o  llenar un formulario para que el seguro cubra el medicamento que se considera necesario.   Si se requiere una autorizacin previa para que su compaa de seguros cubra su medicamento, por favor permtanos de 1 a 2 das hbiles para completar este proceso.  Los precios de los medicamentos varan con frecuencia dependiendo del lugar de dnde se surte la receta y alguna farmacias pueden ofrecer precios ms baratos.  El sitio web www.goodrx.com tiene cupones para   diferentes farmacias. Los precios aqu no tienen en cuenta lo que podra costar con la ayuda del seguro (puede ser ms barato con su seguro), pero el sitio web puede darle el precio si no utiliz Research scientist (physical sciences).  - Puede imprimir el cupn correspondiente y llevarlo con su receta a la farmacia.  - Tambin puede pasar por nuestra oficina durante el horario de atencin regular y Char[employer] fundraiser una tarjeta de cupones de GoodRx.  - Si necesita que su receta se enve electrnicamente a una farmacia diferente, informe a nuestra oficina a travs de MyChart de [facility] o por telfono llamando al [phone] y presione la opcin 4.

## 2022-01-29 NOTE — Progress Notes (Signed)
Follow-Up Visit   Subjective  Abigail [name] is a 56 y.o. female who presents for the following: Annual Exam (Hx of bcc, reports would like left forearm rechecked concerned about spots at old excision site, also concerned with bumps at nose. //). The patient presents for Total-Body Skin Exam (TBSE) for skin cancer screening and mole check.  The patient has spots, moles and lesions to be evaluated, some may be new or changing and the patient has concerns that these could be cancer.  The following portions of the chart were reviewed this encounter and updated as appropriate:  Tobacco  Allergies  Meds  Problems  Med Hx  Surg Hx  Fam Hx     Review of Systems: No other skin or systemic complaints except as noted in HPI or Assessment and Plan.  Objective  Well appearing patient in no apparent distress; mood and affect are within normal limits.  A full examination was performed including scalp, head, eyes, ears, nose, lips, neck, chest, axillae, abdomen, back, buttocks, bilateral upper extremities, bilateral lower extremities, hands, feet, fingers, toes, fingernails, and toenails. All findings within normal limits unless otherwise noted below.  left forehead x 1 Erythematous thin papules/macules with gritty scale.   left lateral brow x 1 Erythematous stuck-on, waxy papule or plaque   Assessment & Plan  Actinic keratosis left forehead x 1  Actinic keratoses are precancerous spots that appear secondary to cumulative UV radiation exposure/sun exposure over time. They are chronic with expected duration over 1 year. A portion of actinic keratoses will progress to squamous cell carcinoma of the skin. It is not possible to reliably predict which spots will progress to skin cancer and so treatment is recommended to prevent development of skin cancer. Recommend daily broad spectrum sunscreen SPF 30+ to sun-exposed areas, reapply every 2 hours as needed.  Recommend staying in the  shade or wearing long sleeves, sun glasses (UVA+UVB protection) and wide brim hats (4-inch brim around the entire circumference of the hat). Call for new or changing lesions.  Destruction of lesion - left forehead x 1 Complexity: simple   Destruction method: cryotherapy   Informed consent: discussed and consent obtained   Timeout:  patient name, date of birth, surgical site, and procedure verified Lesion destroyed using liquid nitrogen: Yes   Region frozen until ice ball extended beyond lesion: Yes   Outcome: patient tolerated procedure well with no complications   Post-procedure details: wound care instructions given   Additional details:  Prior to procedure, discussed risks of blister formation, small wound, skin dyspigmentation, or rare scar following cryotherapy. Recommend Vaseline ointment to treated areas while healing.  Inflamed seborrheic keratosis left lateral brow x 1 Symptomatic, irritating, patient would like treated.  Destruction of lesion - left lateral brow x 1 Complexity: simple   Destruction method: cryotherapy   Informed consent: discussed and consent obtained   Timeout:  patient name, date of birth, surgical site, and procedure verified Lesion destroyed using liquid nitrogen: Yes   Region frozen until ice ball extended beyond lesion: Yes   Outcome: patient tolerated procedure well with no complications   Post-procedure details: wound care instructions given   Additional details:  Prior to procedure, discussed risks of blister formation, small wound, skin dyspigmentation, or rare scar following cryotherapy. Recommend Vaseline ointment to treated areas while healing.  Skin cancer screening  Lentigines - Scattered tan macules - Due to sun exposure - Benign-appearing, observe - Recommend daily broad spectrum sunscreen SPF 30+ to  sun-exposed areas, reapply every 2 hours as needed. - Call for any changes  Seborrheic Keratoses - Stuck-on, waxy, tan-brown papules  and/or plaques  - Benign-appearing - Discussed benign etiology and prognosis. - Observe - Call for any changes  Melanocytic Nevi - Tan-brown and/or pink-flesh-colored symmetric macules and papules - Benign appearing on exam today - Observation - Call clinic for new or changing moles - Recommend daily use of broad spectrum spf 30+ sunscreen to sun-exposed areas.   Hemangiomas - Red papules - Discussed benign nature - Observe - Call for any changes  Actinic Damage - Chronic condition, secondary to cumulative UV/sun exposure - diffuse scaly erythematous macules with underlying dyspigmentation - Recommend daily broad spectrum sunscreen SPF 30+ to sun-exposed areas, reapply every 2 hours as needed.  - Staying in the shade or wearing long sleeves, sun glasses (UVA+UVB protection) and wide brim hats (4-inch brim around the entire circumference of the hat) are also recommended for sun protection.  - Call for new or changing lesions.  History of Basal Cell Carcinoma of the Skin - No evidence of recurrence today multiple locations see history - Recommend regular full body skin exams - Recommend daily broad spectrum sunscreen SPF 30+ to sun-exposed areas, reapply every 2 hours as needed.  - Call if any new or changing lesions are noted between office visits  Skin cancer screening performed today. Return in about 6 months (around 07/31/2022) for TBSE hx of bcc . IRuthell Rummage, CMA, am acting as scribe for Sarina Ser, MD. Documentation: I have reviewed the above documentation for accuracy and completeness, and I agree with the above.  Sarina Ser, MD

## 2022-01-30 ENCOUNTER — Encounter: Payer: Self-pay | Admitting: Dermatology

## 2022-04-30 DIAGNOSIS — M47817 Spondylosis without myelopathy or radiculopathy, lumbosacral region: Secondary | ICD-10-CM | POA: Diagnosis not present

## 2022-05-18 DIAGNOSIS — S60512A Abrasion of left hand, initial encounter: Secondary | ICD-10-CM | POA: Diagnosis not present

## 2022-05-18 DIAGNOSIS — M069 Rheumatoid arthritis, unspecified: Secondary | ICD-10-CM | POA: Diagnosis not present

## 2022-05-18 DIAGNOSIS — R110 Nausea: Secondary | ICD-10-CM | POA: Diagnosis not present

## 2022-06-18 DIAGNOSIS — Z79891 Long term (current) use of opiate analgesic: Secondary | ICD-10-CM | POA: Diagnosis not present

## 2022-06-18 DIAGNOSIS — M25552 Pain in left hip: Secondary | ICD-10-CM | POA: Diagnosis not present

## 2022-06-18 DIAGNOSIS — M542 Cervicalgia: Secondary | ICD-10-CM | POA: Diagnosis not present

## 2022-06-18 DIAGNOSIS — M6281 Muscle weakness (generalized): Secondary | ICD-10-CM | POA: Diagnosis not present

## 2022-06-18 DIAGNOSIS — M7918 Myalgia, other site: Secondary | ICD-10-CM | POA: Diagnosis not present

## 2022-06-18 DIAGNOSIS — M79605 Pain in left leg: Secondary | ICD-10-CM | POA: Diagnosis not present

## 2022-06-18 DIAGNOSIS — G894 Chronic pain syndrome: Secondary | ICD-10-CM | POA: Diagnosis not present

## 2022-06-18 DIAGNOSIS — M546 Pain in thoracic spine: Secondary | ICD-10-CM | POA: Diagnosis not present

## 2022-06-18 DIAGNOSIS — M47817 Spondylosis without myelopathy or radiculopathy, lumbosacral region: Secondary | ICD-10-CM | POA: Diagnosis not present

## 2022-06-28 DIAGNOSIS — M069 Rheumatoid arthritis, unspecified: Secondary | ICD-10-CM | POA: Diagnosis not present

## 2022-06-28 DIAGNOSIS — M797 Fibromyalgia: Secondary | ICD-10-CM | POA: Diagnosis not present

## 2022-06-28 DIAGNOSIS — M810 Age-related osteoporosis without current pathological fracture: Secondary | ICD-10-CM | POA: Diagnosis not present

## 2022-06-28 DIAGNOSIS — R609 Edema, unspecified: Secondary | ICD-10-CM | POA: Diagnosis not present

## 2022-07-02 DIAGNOSIS — E785 Hyperlipidemia, unspecified: Secondary | ICD-10-CM | POA: Diagnosis not present

## 2022-07-02 DIAGNOSIS — M549 Dorsalgia, unspecified: Secondary | ICD-10-CM | POA: Diagnosis not present

## 2022-07-02 DIAGNOSIS — I10 Essential (primary) hypertension: Secondary | ICD-10-CM | POA: Diagnosis not present

## 2022-07-02 DIAGNOSIS — Z20822 Contact with and (suspected) exposure to covid-19: Secondary | ICD-10-CM | POA: Diagnosis not present

## 2022-07-04 DIAGNOSIS — M069 Rheumatoid arthritis, unspecified: Secondary | ICD-10-CM | POA: Diagnosis not present

## 2022-07-04 DIAGNOSIS — R609 Edema, unspecified: Secondary | ICD-10-CM | POA: Diagnosis not present

## 2022-07-04 DIAGNOSIS — M797 Fibromyalgia: Secondary | ICD-10-CM | POA: Diagnosis not present

## 2022-07-04 DIAGNOSIS — F17200 Nicotine dependence, unspecified, uncomplicated: Secondary | ICD-10-CM | POA: Diagnosis not present

## 2022-07-04 DIAGNOSIS — M808AXA Other osteoporosis with current pathological fracture, other site, initial encounter for fracture: Secondary | ICD-10-CM | POA: Diagnosis not present

## 2022-07-04 DIAGNOSIS — E876 Hypokalemia: Secondary | ICD-10-CM | POA: Diagnosis not present

## 2022-07-04 DIAGNOSIS — G479 Sleep disorder, unspecified: Secondary | ICD-10-CM | POA: Diagnosis not present

## 2022-07-06 DIAGNOSIS — M546 Pain in thoracic spine: Secondary | ICD-10-CM | POA: Diagnosis not present

## 2022-07-10 DIAGNOSIS — M546 Pain in thoracic spine: Secondary | ICD-10-CM | POA: Diagnosis not present

## 2022-07-10 DIAGNOSIS — Z20822 Contact with and (suspected) exposure to covid-19: Secondary | ICD-10-CM | POA: Diagnosis not present

## 2022-07-10 DIAGNOSIS — R059 Cough, unspecified: Secondary | ICD-10-CM | POA: Diagnosis not present

## 2022-07-16 DIAGNOSIS — M546 Pain in thoracic spine: Secondary | ICD-10-CM | POA: Diagnosis not present

## 2022-08-01 ENCOUNTER — Encounter: Payer: Medicare Other | Admitting: Dermatology

## 2022-08-05 ENCOUNTER — Other Ambulatory Visit: Payer: Self-pay

## 2022-08-05 ENCOUNTER — Emergency Department
Admission: EM | Admit: 2022-08-05 | Discharge: 2022-08-05 | Discharge status: Against Medical Advice | Payer: Medicare Other | Attending: Emergency Medicine | Admitting: Emergency Medicine

## 2022-08-05 DIAGNOSIS — R197 Diarrhea, unspecified: Secondary | ICD-10-CM | POA: Diagnosis not present

## 2022-08-05 DIAGNOSIS — R112 Nausea with vomiting, unspecified: Secondary | ICD-10-CM | POA: Diagnosis not present

## 2022-08-05 DIAGNOSIS — R110 Nausea: Secondary | ICD-10-CM | POA: Diagnosis not present

## 2022-08-05 DIAGNOSIS — Z5321 Procedure and treatment not carried out due to patient leaving prior to being seen by health care provider: Secondary | ICD-10-CM | POA: Insufficient documentation

## 2022-08-05 DIAGNOSIS — R531 Weakness: Secondary | ICD-10-CM | POA: Insufficient documentation

## 2022-08-05 DIAGNOSIS — Z1152 Encounter for screening for COVID-19: Secondary | ICD-10-CM | POA: Insufficient documentation

## 2022-08-05 DIAGNOSIS — Z743 Need for continuous supervision: Secondary | ICD-10-CM | POA: Diagnosis not present

## 2022-08-05 DIAGNOSIS — R6889 Other general symptoms and signs: Secondary | ICD-10-CM | POA: Diagnosis not present

## 2022-08-05 DIAGNOSIS — R0902 Hypoxemia: Secondary | ICD-10-CM | POA: Diagnosis not present

## 2022-08-05 LAB — BASIC METABOLIC PANEL
Anion gap: 12 (ref 5–15)
BUN: 5 mg/dL — ABNORMAL LOW (ref 6–20)
CO2: 27 mmol/L (ref 22–32)
Calcium: 8.5 mg/dL — ABNORMAL LOW (ref 8.9–10.3)
Chloride: 96 mmol/L — ABNORMAL LOW (ref 98–111)
Creatinine, Ser: 0.46 mg/dL (ref 0.44–1.00)
GFR, Estimated: >60 mL/min (ref >60)
Glucose, Bld: 150 mg/dL — ABNORMAL HIGH (ref 70–99)
Potassium: 2.4 mmol/L — CL (ref 3.5–5.1)
Sodium: 135 mmol/L (ref 135–145)

## 2022-08-05 LAB — CBC
HCT: 46.7 % — ABNORMAL HIGH (ref 36.0–46.0)
Hemoglobin: 16.5 g/dL — ABNORMAL HIGH (ref 12.0–15.0)
MCH: 32 pg (ref 26.0–34.0)
MCHC: 35.3 g/dL (ref 30.0–36.0)
MCV: 90.5 fL (ref 80.0–100.0)
Platelets: 310 10*3/uL (ref 150–400)
RBC: 5.16 MIL/uL — ABNORMAL HIGH (ref 3.87–5.11)
RDW: 12.9 % (ref 11.5–15.5)
WBC: 27.2 10*3/uL — ABNORMAL HIGH (ref 4.0–10.5)
nRBC: 0 % (ref 0.0–0.2)

## 2022-08-05 LAB — RESP PANEL BY RT-PCR (RSV, FLU A&B, COVID)  RVPGX2
Influenza A by PCR: NEGATIVE
Influenza B by PCR: NEGATIVE
Resp Syncytial Virus by PCR: NEGATIVE
SARS Coronavirus 2 by RT PCR: NEGATIVE

## 2022-08-05 LAB — TROPONIN I (HIGH SENSITIVITY): Troponin I (High Sensitivity): 4 ng/L (ref <18)

## 2022-08-05 NOTE — ED Notes (Signed)
First nurse note: Arrived via EMS from  home for generalized weakness, nausea, vomiting and diarrhea x 1 week. CBG 167. 148/78 ST on monitor at 120 98.4 97% RA. 20 G right hand. 4 mg Zofran IV given en route by EMS.

## 2022-08-05 NOTE — ED Notes (Signed)
Received call from the lab around 22:06 that the patient had a potassium of 2.4 Dr. Cinda Quest notified, pt already left without being seen. No additional orders at this time.

## 2022-08-05 NOTE — ED Notes (Signed)
Patient Abigail [name] states she wants IV out and wants to go home.

## 2022-08-05 NOTE — ED Triage Notes (Signed)
Pt to ED by EMS. Pt hasn't been able to eat anything since Monday. Pt is nauseated with diarrhea. Pt has been taking prescribed zofran at home. Pt also takes oxycodone as well. Pt has been sick since Tuesday of last week. Pt does not have a PCP.

## 2022-08-06 ENCOUNTER — Telehealth: Payer: Self-pay | Admitting: Emergency Medicine

## 2022-08-06 NOTE — Telephone Encounter (Signed)
Called patient due to left emergency department before provider exam to inquire about condition and follow up plans. Patient is sleeping, but her partner Benard Rink is designated party release.  I spoke to him and explained that I would recommend that she return or see a doctor somewhere to find out why she has the critical labs.  I asked him to let her know my recommendations to return to the ED.

## 2022-08-07 DIAGNOSIS — Z7901 Long term (current) use of anticoagulants: Secondary | ICD-10-CM | POA: Diagnosis not present

## 2022-08-07 DIAGNOSIS — I739 Peripheral vascular disease, unspecified: Secondary | ICD-10-CM | POA: Diagnosis not present

## 2022-08-07 DIAGNOSIS — Z720 Tobacco use: Secondary | ICD-10-CM | POA: Diagnosis not present

## 2022-08-07 DIAGNOSIS — Z794 Long term (current) use of insulin: Secondary | ICD-10-CM | POA: Diagnosis not present

## 2022-08-07 DIAGNOSIS — R1110 Vomiting, unspecified: Secondary | ICD-10-CM | POA: Diagnosis not present

## 2022-08-07 DIAGNOSIS — R739 Hyperglycemia, unspecified: Secondary | ICD-10-CM | POA: Diagnosis not present

## 2022-08-07 DIAGNOSIS — M5136 Other intervertebral disc degeneration, lumbar region: Secondary | ICD-10-CM | POA: Diagnosis not present

## 2022-08-07 DIAGNOSIS — Z79891 Long term (current) use of opiate analgesic: Secondary | ICD-10-CM | POA: Diagnosis not present

## 2022-08-07 DIAGNOSIS — M797 Fibromyalgia: Secondary | ICD-10-CM | POA: Diagnosis not present

## 2022-08-07 DIAGNOSIS — R1013 Epigastric pain: Secondary | ICD-10-CM | POA: Diagnosis not present

## 2022-08-07 DIAGNOSIS — M316 Other giant cell arteritis: Secondary | ICD-10-CM | POA: Diagnosis not present

## 2022-08-07 DIAGNOSIS — Z981 Arthrodesis status: Secondary | ICD-10-CM | POA: Diagnosis not present

## 2022-08-07 DIAGNOSIS — M26629 Arthralgia of temporomandibular joint, unspecified side: Secondary | ICD-10-CM | POA: Diagnosis not present

## 2022-08-07 DIAGNOSIS — K529 Noninfective gastroenteritis and colitis, unspecified: Secondary | ICD-10-CM | POA: Diagnosis not present

## 2022-08-07 DIAGNOSIS — K219 Gastro-esophageal reflux disease without esophagitis: Secondary | ICD-10-CM | POA: Diagnosis not present

## 2022-08-07 DIAGNOSIS — E860 Dehydration: Secondary | ICD-10-CM | POA: Diagnosis not present

## 2022-08-07 DIAGNOSIS — M069 Rheumatoid arthritis, unspecified: Secondary | ICD-10-CM | POA: Diagnosis not present

## 2022-08-07 DIAGNOSIS — E039 Hypothyroidism, unspecified: Secondary | ICD-10-CM | POA: Diagnosis not present

## 2022-08-07 DIAGNOSIS — K760 Fatty (change of) liver, not elsewhere classified: Secondary | ICD-10-CM | POA: Diagnosis not present

## 2022-08-07 DIAGNOSIS — M549 Dorsalgia, unspecified: Secondary | ICD-10-CM | POA: Diagnosis not present

## 2022-08-07 DIAGNOSIS — R1032 Left lower quadrant pain: Secondary | ICD-10-CM | POA: Diagnosis not present

## 2022-08-07 DIAGNOSIS — K859 Acute pancreatitis without necrosis or infection, unspecified: Secondary | ICD-10-CM | POA: Diagnosis not present

## 2022-08-07 DIAGNOSIS — T380X5D Adverse effect of glucocorticoids and synthetic analogues, subsequent encounter: Secondary | ICD-10-CM | POA: Diagnosis not present

## 2022-08-07 DIAGNOSIS — Z79899 Other long term (current) drug therapy: Secondary | ICD-10-CM | POA: Diagnosis not present

## 2022-08-07 DIAGNOSIS — R9431 Abnormal electrocardiogram [ECG] [EKG]: Secondary | ICD-10-CM | POA: Diagnosis not present

## 2022-08-07 DIAGNOSIS — I776 Arteritis, unspecified: Secondary | ICD-10-CM | POA: Diagnosis not present

## 2022-08-07 DIAGNOSIS — F17210 Nicotine dependence, cigarettes, uncomplicated: Secondary | ICD-10-CM | POA: Diagnosis not present

## 2022-08-07 DIAGNOSIS — J45909 Unspecified asthma, uncomplicated: Secondary | ICD-10-CM | POA: Diagnosis not present

## 2022-08-07 DIAGNOSIS — E876 Hypokalemia: Secondary | ICD-10-CM | POA: Diagnosis not present

## 2022-08-07 DIAGNOSIS — A084 Viral intestinal infection, unspecified: Secondary | ICD-10-CM | POA: Diagnosis not present

## 2022-08-07 DIAGNOSIS — G8929 Other chronic pain: Secondary | ICD-10-CM | POA: Diagnosis not present

## 2022-08-07 DIAGNOSIS — M5137 Other intervertebral disc degeneration, lumbosacral region: Secondary | ICD-10-CM | POA: Diagnosis not present

## 2022-08-07 DIAGNOSIS — K858 Other acute pancreatitis without necrosis or infection: Secondary | ICD-10-CM | POA: Diagnosis not present

## 2022-08-07 DIAGNOSIS — Z7952 Long term (current) use of systemic steroids: Secondary | ICD-10-CM | POA: Diagnosis not present

## 2022-08-07 DIAGNOSIS — R1012 Left upper quadrant pain: Secondary | ICD-10-CM | POA: Diagnosis not present

## 2022-08-07 DIAGNOSIS — K8500 Idiopathic acute pancreatitis without necrosis or infection: Secondary | ICD-10-CM | POA: Diagnosis not present

## 2022-08-07 DIAGNOSIS — R110 Nausea: Secondary | ICD-10-CM | POA: Diagnosis not present

## 2022-08-07 DIAGNOSIS — H538 Other visual disturbances: Secondary | ICD-10-CM | POA: Diagnosis not present

## 2022-08-07 DIAGNOSIS — Z20822 Contact with and (suspected) exposure to covid-19: Secondary | ICD-10-CM | POA: Diagnosis not present

## 2022-08-07 DIAGNOSIS — Z882 Allergy status to sulfonamides status: Secondary | ICD-10-CM | POA: Diagnosis not present

## 2022-08-07 DIAGNOSIS — Z792 Long term (current) use of antibiotics: Secondary | ICD-10-CM | POA: Diagnosis not present

## 2022-08-07 DIAGNOSIS — R519 Headache, unspecified: Secondary | ICD-10-CM | POA: Diagnosis not present

## 2022-08-07 DIAGNOSIS — F32A Depression, unspecified: Secondary | ICD-10-CM | POA: Diagnosis not present

## 2022-08-08 DIAGNOSIS — E876 Hypokalemia: Secondary | ICD-10-CM | POA: Diagnosis not present

## 2022-08-08 DIAGNOSIS — M5136 Other intervertebral disc degeneration, lumbar region: Secondary | ICD-10-CM | POA: Diagnosis not present

## 2022-08-08 DIAGNOSIS — F17200 Nicotine dependence, unspecified, uncomplicated: Secondary | ICD-10-CM | POA: Insufficient documentation

## 2022-08-08 DIAGNOSIS — K8500 Idiopathic acute pancreatitis without necrosis or infection: Secondary | ICD-10-CM | POA: Insufficient documentation

## 2022-08-08 DIAGNOSIS — Z79899 Other long term (current) drug therapy: Secondary | ICD-10-CM | POA: Diagnosis not present

## 2022-08-08 DIAGNOSIS — R9431 Abnormal electrocardiogram [ECG] [EKG]: Secondary | ICD-10-CM | POA: Diagnosis not present

## 2022-08-08 DIAGNOSIS — K858 Other acute pancreatitis without necrosis or infection: Secondary | ICD-10-CM | POA: Diagnosis not present

## 2022-08-08 DIAGNOSIS — M069 Rheumatoid arthritis, unspecified: Secondary | ICD-10-CM | POA: Diagnosis not present

## 2022-08-08 DIAGNOSIS — F17210 Nicotine dependence, cigarettes, uncomplicated: Secondary | ICD-10-CM | POA: Diagnosis not present

## 2022-08-08 DIAGNOSIS — K760 Fatty (change of) liver, not elsewhere classified: Secondary | ICD-10-CM | POA: Diagnosis not present

## 2022-08-08 DIAGNOSIS — G8929 Other chronic pain: Secondary | ICD-10-CM | POA: Diagnosis not present

## 2022-08-08 DIAGNOSIS — K859 Acute pancreatitis without necrosis or infection, unspecified: Secondary | ICD-10-CM | POA: Diagnosis not present

## 2022-08-08 DIAGNOSIS — M549 Dorsalgia, unspecified: Secondary | ICD-10-CM | POA: Diagnosis not present

## 2022-08-09 ENCOUNTER — Telehealth: Payer: Self-pay

## 2022-08-09 DIAGNOSIS — E876 Hypokalemia: Secondary | ICD-10-CM | POA: Diagnosis not present

## 2022-08-09 DIAGNOSIS — Z720 Tobacco use: Secondary | ICD-10-CM | POA: Diagnosis not present

## 2022-08-09 DIAGNOSIS — Z79899 Other long term (current) drug therapy: Secondary | ICD-10-CM | POA: Diagnosis not present

## 2022-08-09 DIAGNOSIS — Z792 Long term (current) use of antibiotics: Secondary | ICD-10-CM | POA: Diagnosis not present

## 2022-08-09 DIAGNOSIS — M069 Rheumatoid arthritis, unspecified: Secondary | ICD-10-CM | POA: Diagnosis not present

## 2022-08-09 DIAGNOSIS — G8929 Other chronic pain: Secondary | ICD-10-CM | POA: Diagnosis not present

## 2022-08-09 DIAGNOSIS — M549 Dorsalgia, unspecified: Secondary | ICD-10-CM | POA: Diagnosis not present

## 2022-08-09 NOTE — Telephone Encounter (Signed)
        Patient  visited [place] on 12/17    Telephone encounter attempt :  1st  A HIPAA compliant voice message was left requesting a return call.  Instructed patient to call back .    [place], Care Management  [phone] [address], [place], [place] [address] Phone: [phone] Email: [email or web address]

## 2022-08-10 DIAGNOSIS — E876 Hypokalemia: Secondary | ICD-10-CM | POA: Diagnosis not present

## 2022-08-10 DIAGNOSIS — R519 Headache, unspecified: Secondary | ICD-10-CM | POA: Diagnosis not present

## 2022-08-10 DIAGNOSIS — K858 Other acute pancreatitis without necrosis or infection: Secondary | ICD-10-CM | POA: Diagnosis not present

## 2022-08-10 DIAGNOSIS — M549 Dorsalgia, unspecified: Secondary | ICD-10-CM | POA: Diagnosis not present

## 2022-08-10 DIAGNOSIS — M069 Rheumatoid arthritis, unspecified: Secondary | ICD-10-CM | POA: Diagnosis not present

## 2022-08-10 DIAGNOSIS — F17210 Nicotine dependence, cigarettes, uncomplicated: Secondary | ICD-10-CM | POA: Diagnosis not present

## 2022-08-10 DIAGNOSIS — Z79899 Other long term (current) drug therapy: Secondary | ICD-10-CM | POA: Diagnosis not present

## 2022-08-10 DIAGNOSIS — G8929 Other chronic pain: Secondary | ICD-10-CM | POA: Diagnosis not present

## 2022-08-11 DIAGNOSIS — M069 Rheumatoid arthritis, unspecified: Secondary | ICD-10-CM | POA: Diagnosis not present

## 2022-08-11 DIAGNOSIS — R519 Headache, unspecified: Secondary | ICD-10-CM | POA: Diagnosis not present

## 2022-08-11 DIAGNOSIS — G8929 Other chronic pain: Secondary | ICD-10-CM | POA: Diagnosis not present

## 2022-08-11 DIAGNOSIS — R739 Hyperglycemia, unspecified: Secondary | ICD-10-CM | POA: Diagnosis not present

## 2022-08-11 DIAGNOSIS — M549 Dorsalgia, unspecified: Secondary | ICD-10-CM | POA: Diagnosis not present

## 2022-08-11 DIAGNOSIS — M316 Other giant cell arteritis: Secondary | ICD-10-CM | POA: Diagnosis not present

## 2022-08-11 DIAGNOSIS — Z792 Long term (current) use of antibiotics: Secondary | ICD-10-CM | POA: Diagnosis not present

## 2022-08-11 DIAGNOSIS — Z79899 Other long term (current) drug therapy: Secondary | ICD-10-CM | POA: Diagnosis not present

## 2022-08-11 DIAGNOSIS — F17210 Nicotine dependence, cigarettes, uncomplicated: Secondary | ICD-10-CM | POA: Diagnosis not present

## 2022-08-12 DIAGNOSIS — R519 Headache, unspecified: Secondary | ICD-10-CM | POA: Diagnosis not present

## 2022-08-12 DIAGNOSIS — M5137 Other intervertebral disc degeneration, lumbosacral region: Secondary | ICD-10-CM | POA: Diagnosis not present

## 2022-08-12 DIAGNOSIS — F17210 Nicotine dependence, cigarettes, uncomplicated: Secondary | ICD-10-CM | POA: Diagnosis not present

## 2022-08-12 DIAGNOSIS — Z794 Long term (current) use of insulin: Secondary | ICD-10-CM | POA: Diagnosis not present

## 2022-08-12 DIAGNOSIS — G8929 Other chronic pain: Secondary | ICD-10-CM | POA: Diagnosis not present

## 2022-08-12 DIAGNOSIS — R739 Hyperglycemia, unspecified: Secondary | ICD-10-CM | POA: Diagnosis not present

## 2022-08-12 DIAGNOSIS — Z981 Arthrodesis status: Secondary | ICD-10-CM | POA: Diagnosis not present

## 2022-08-12 DIAGNOSIS — Z792 Long term (current) use of antibiotics: Secondary | ICD-10-CM | POA: Diagnosis not present

## 2022-08-12 DIAGNOSIS — M549 Dorsalgia, unspecified: Secondary | ICD-10-CM | POA: Diagnosis not present

## 2022-08-12 DIAGNOSIS — M069 Rheumatoid arthritis, unspecified: Secondary | ICD-10-CM | POA: Diagnosis not present

## 2022-08-12 DIAGNOSIS — Z7901 Long term (current) use of anticoagulants: Secondary | ICD-10-CM | POA: Diagnosis not present

## 2022-08-12 DIAGNOSIS — T380X5A Adverse effect of glucocorticoids and synthetic analogues, initial encounter: Secondary | ICD-10-CM | POA: Insufficient documentation

## 2022-08-12 DIAGNOSIS — Z79899 Other long term (current) drug therapy: Secondary | ICD-10-CM | POA: Diagnosis not present

## 2022-08-13 DIAGNOSIS — R519 Headache, unspecified: Secondary | ICD-10-CM | POA: Diagnosis not present

## 2022-08-13 DIAGNOSIS — Z79899 Other long term (current) drug therapy: Secondary | ICD-10-CM | POA: Diagnosis not present

## 2022-08-13 DIAGNOSIS — M549 Dorsalgia, unspecified: Secondary | ICD-10-CM | POA: Diagnosis not present

## 2022-08-13 DIAGNOSIS — G8929 Other chronic pain: Secondary | ICD-10-CM | POA: Diagnosis not present

## 2022-08-13 DIAGNOSIS — Z792 Long term (current) use of antibiotics: Secondary | ICD-10-CM | POA: Diagnosis not present

## 2022-08-13 DIAGNOSIS — R739 Hyperglycemia, unspecified: Secondary | ICD-10-CM | POA: Diagnosis not present

## 2022-08-13 DIAGNOSIS — Z7901 Long term (current) use of anticoagulants: Secondary | ICD-10-CM | POA: Diagnosis not present

## 2022-08-13 DIAGNOSIS — F17210 Nicotine dependence, cigarettes, uncomplicated: Secondary | ICD-10-CM | POA: Diagnosis not present

## 2022-08-14 ENCOUNTER — Telehealth: Payer: Self-pay

## 2022-08-14 DIAGNOSIS — R519 Headache, unspecified: Secondary | ICD-10-CM | POA: Diagnosis not present

## 2022-08-14 DIAGNOSIS — K859 Acute pancreatitis without necrosis or infection, unspecified: Secondary | ICD-10-CM | POA: Diagnosis not present

## 2022-08-14 DIAGNOSIS — M316 Other giant cell arteritis: Secondary | ICD-10-CM | POA: Diagnosis not present

## 2022-08-14 DIAGNOSIS — I776 Arteritis, unspecified: Secondary | ICD-10-CM | POA: Diagnosis not present

## 2022-08-14 NOTE — Telephone Encounter (Signed)
        Patient  visited [facility] on 12/17     Telephone encounter attempt :  2nd  A HIPAA compliant voice message was left requesting a return call.  Instructed patient to call back  .    [place], Care Management  [phone] [address], [place], [place] [address] Phone: [phone] Email: [email or web address]

## 2022-08-15 DIAGNOSIS — M069 Rheumatoid arthritis, unspecified: Secondary | ICD-10-CM | POA: Diagnosis not present

## 2022-08-15 DIAGNOSIS — R1013 Epigastric pain: Secondary | ICD-10-CM | POA: Diagnosis not present

## 2022-08-15 DIAGNOSIS — R739 Hyperglycemia, unspecified: Secondary | ICD-10-CM | POA: Diagnosis not present

## 2022-08-15 DIAGNOSIS — G8929 Other chronic pain: Secondary | ICD-10-CM | POA: Diagnosis not present

## 2022-08-15 DIAGNOSIS — E876 Hypokalemia: Secondary | ICD-10-CM | POA: Diagnosis not present

## 2022-08-15 DIAGNOSIS — T380X5D Adverse effect of glucocorticoids and synthetic analogues, subsequent encounter: Secondary | ICD-10-CM | POA: Diagnosis not present

## 2022-08-15 DIAGNOSIS — Z792 Long term (current) use of antibiotics: Secondary | ICD-10-CM | POA: Diagnosis not present

## 2022-08-15 DIAGNOSIS — F17210 Nicotine dependence, cigarettes, uncomplicated: Secondary | ICD-10-CM | POA: Diagnosis not present

## 2022-08-15 DIAGNOSIS — R1012 Left upper quadrant pain: Secondary | ICD-10-CM | POA: Diagnosis not present

## 2022-08-15 DIAGNOSIS — K859 Acute pancreatitis without necrosis or infection, unspecified: Secondary | ICD-10-CM | POA: Diagnosis not present

## 2022-08-15 DIAGNOSIS — Z79899 Other long term (current) drug therapy: Secondary | ICD-10-CM | POA: Diagnosis not present

## 2022-08-15 DIAGNOSIS — K858 Other acute pancreatitis without necrosis or infection: Secondary | ICD-10-CM | POA: Diagnosis not present

## 2022-08-15 DIAGNOSIS — M549 Dorsalgia, unspecified: Secondary | ICD-10-CM | POA: Diagnosis not present

## 2022-08-15 DIAGNOSIS — R519 Headache, unspecified: Secondary | ICD-10-CM | POA: Diagnosis not present

## 2022-08-15 DIAGNOSIS — Z7901 Long term (current) use of anticoagulants: Secondary | ICD-10-CM | POA: Diagnosis not present

## 2022-08-16 DIAGNOSIS — K8500 Idiopathic acute pancreatitis without necrosis or infection: Secondary | ICD-10-CM | POA: Diagnosis not present

## 2022-08-18 DIAGNOSIS — R519 Headache, unspecified: Secondary | ICD-10-CM | POA: Diagnosis not present

## 2022-08-20 DIAGNOSIS — M546 Pain in thoracic spine: Secondary | ICD-10-CM | POA: Diagnosis not present

## 2022-08-21 DIAGNOSIS — M316 Other giant cell arteritis: Secondary | ICD-10-CM | POA: Diagnosis not present

## 2022-08-21 DIAGNOSIS — Z981 Arthrodesis status: Secondary | ICD-10-CM | POA: Diagnosis not present

## 2022-08-21 DIAGNOSIS — R531 Weakness: Secondary | ICD-10-CM | POA: Diagnosis not present

## 2022-08-21 DIAGNOSIS — Z20822 Contact with and (suspected) exposure to covid-19: Secondary | ICD-10-CM | POA: Diagnosis not present

## 2022-08-21 DIAGNOSIS — E876 Hypokalemia: Secondary | ICD-10-CM | POA: Diagnosis not present

## 2022-08-21 DIAGNOSIS — Z1152 Encounter for screening for COVID-19: Secondary | ICD-10-CM | POA: Diagnosis not present

## 2022-08-21 DIAGNOSIS — H538 Other visual disturbances: Secondary | ICD-10-CM | POA: Diagnosis not present

## 2022-08-21 DIAGNOSIS — R109 Unspecified abdominal pain: Secondary | ICD-10-CM | POA: Diagnosis not present

## 2022-08-21 DIAGNOSIS — K861 Other chronic pancreatitis: Secondary | ICD-10-CM | POA: Diagnosis not present

## 2022-08-21 DIAGNOSIS — K8500 Idiopathic acute pancreatitis without necrosis or infection: Secondary | ICD-10-CM | POA: Diagnosis not present

## 2022-08-21 DIAGNOSIS — R6884 Jaw pain: Secondary | ICD-10-CM | POA: Diagnosis not present

## 2022-08-21 DIAGNOSIS — M79669 Pain in unspecified lower leg: Secondary | ICD-10-CM | POA: Diagnosis not present

## 2022-08-21 DIAGNOSIS — M791 Myalgia, unspecified site: Secondary | ICD-10-CM | POA: Diagnosis not present

## 2022-08-21 DIAGNOSIS — Z7952 Long term (current) use of systemic steroids: Secondary | ICD-10-CM | POA: Diagnosis not present

## 2022-08-21 DIAGNOSIS — K858 Other acute pancreatitis without necrosis or infection: Secondary | ICD-10-CM | POA: Diagnosis not present

## 2022-08-21 DIAGNOSIS — G4700 Insomnia, unspecified: Secondary | ICD-10-CM | POA: Diagnosis not present

## 2022-08-21 DIAGNOSIS — R410 Disorientation, unspecified: Secondary | ICD-10-CM | POA: Diagnosis not present

## 2022-08-21 DIAGNOSIS — R197 Diarrhea, unspecified: Secondary | ICD-10-CM | POA: Diagnosis not present

## 2022-08-21 DIAGNOSIS — R5383 Other fatigue: Secondary | ICD-10-CM | POA: Diagnosis not present

## 2022-08-21 DIAGNOSIS — K0889 Other specified disorders of teeth and supporting structures: Secondary | ICD-10-CM | POA: Diagnosis not present

## 2022-08-21 DIAGNOSIS — R519 Headache, unspecified: Secondary | ICD-10-CM | POA: Diagnosis not present

## 2022-08-21 DIAGNOSIS — M8580 Other specified disorders of bone density and structure, unspecified site: Secondary | ICD-10-CM | POA: Diagnosis not present

## 2022-08-21 DIAGNOSIS — K859 Acute pancreatitis without necrosis or infection, unspecified: Secondary | ICD-10-CM | POA: Diagnosis not present

## 2022-08-21 DIAGNOSIS — R000 Tachycardia, unspecified: Secondary | ICD-10-CM | POA: Diagnosis not present

## 2022-08-21 DIAGNOSIS — F17210 Nicotine dependence, cigarettes, uncomplicated: Secondary | ICD-10-CM | POA: Diagnosis not present

## 2022-08-21 DIAGNOSIS — K219 Gastro-esophageal reflux disease without esophagitis: Secondary | ICD-10-CM | POA: Diagnosis not present

## 2022-08-21 DIAGNOSIS — M419 Scoliosis, unspecified: Secondary | ICD-10-CM | POA: Diagnosis not present

## 2022-08-21 DIAGNOSIS — Z7984 Long term (current) use of oral hypoglycemic drugs: Secondary | ICD-10-CM | POA: Diagnosis not present

## 2022-08-21 DIAGNOSIS — M549 Dorsalgia, unspecified: Secondary | ICD-10-CM | POA: Diagnosis not present

## 2022-08-21 DIAGNOSIS — F32A Depression, unspecified: Secondary | ICD-10-CM | POA: Diagnosis not present

## 2022-08-21 DIAGNOSIS — G8929 Other chronic pain: Secondary | ICD-10-CM | POA: Diagnosis not present

## 2022-08-21 DIAGNOSIS — G9340 Encephalopathy, unspecified: Secondary | ICD-10-CM | POA: Diagnosis not present

## 2022-08-21 DIAGNOSIS — R110 Nausea: Secondary | ICD-10-CM | POA: Diagnosis not present

## 2022-08-21 DIAGNOSIS — M069 Rheumatoid arthritis, unspecified: Secondary | ICD-10-CM | POA: Diagnosis not present

## 2022-08-21 DIAGNOSIS — Z79891 Long term (current) use of opiate analgesic: Secondary | ICD-10-CM | POA: Diagnosis not present

## 2022-08-21 DIAGNOSIS — Z882 Allergy status to sulfonamides status: Secondary | ICD-10-CM | POA: Diagnosis not present

## 2022-08-21 DIAGNOSIS — M545 Low back pain, unspecified: Secondary | ICD-10-CM | POA: Diagnosis not present

## 2022-08-22 DIAGNOSIS — R410 Disorientation, unspecified: Secondary | ICD-10-CM | POA: Diagnosis not present

## 2022-08-23 DIAGNOSIS — K1379 Other lesions of oral mucosa: Secondary | ICD-10-CM | POA: Insufficient documentation

## 2022-08-24 DIAGNOSIS — M316 Other giant cell arteritis: Secondary | ICD-10-CM | POA: Insufficient documentation

## 2022-08-29 DIAGNOSIS — M546 Pain in thoracic spine: Secondary | ICD-10-CM | POA: Diagnosis not present

## 2022-08-31 NOTE — Progress Notes (Signed)
In order to review treatment

## 2022-09-03 DIAGNOSIS — M316 Other giant cell arteritis: Secondary | ICD-10-CM | POA: Diagnosis not present

## 2022-09-03 DIAGNOSIS — K859 Acute pancreatitis without necrosis or infection, unspecified: Secondary | ICD-10-CM | POA: Diagnosis not present

## 2022-09-03 DIAGNOSIS — M0609 Rheumatoid arthritis without rheumatoid factor, multiple sites: Secondary | ICD-10-CM | POA: Diagnosis not present

## 2022-09-03 DIAGNOSIS — Z7952 Long term (current) use of systemic steroids: Secondary | ICD-10-CM | POA: Diagnosis not present

## 2022-09-05 DIAGNOSIS — M542 Cervicalgia: Secondary | ICD-10-CM | POA: Diagnosis not present

## 2022-09-05 DIAGNOSIS — R109 Unspecified abdominal pain: Secondary | ICD-10-CM | POA: Diagnosis not present

## 2022-09-05 DIAGNOSIS — F32A Depression, unspecified: Secondary | ICD-10-CM | POA: Diagnosis not present

## 2022-09-05 DIAGNOSIS — Z79891 Long term (current) use of opiate analgesic: Secondary | ICD-10-CM | POA: Diagnosis not present

## 2022-09-05 DIAGNOSIS — M48061 Spinal stenosis, lumbar region without neurogenic claudication: Secondary | ICD-10-CM | POA: Diagnosis not present

## 2022-09-05 DIAGNOSIS — M7918 Myalgia, other site: Secondary | ICD-10-CM | POA: Diagnosis not present

## 2022-09-05 DIAGNOSIS — M546 Pain in thoracic spine: Secondary | ICD-10-CM | POA: Diagnosis not present

## 2022-09-05 DIAGNOSIS — M79671 Pain in right foot: Secondary | ICD-10-CM | POA: Diagnosis not present

## 2022-09-05 DIAGNOSIS — M25552 Pain in left hip: Secondary | ICD-10-CM | POA: Diagnosis not present

## 2022-09-05 DIAGNOSIS — G894 Chronic pain syndrome: Secondary | ICD-10-CM | POA: Diagnosis not present

## 2022-09-07 DIAGNOSIS — M546 Pain in thoracic spine: Secondary | ICD-10-CM | POA: Diagnosis not present

## 2022-09-13 DIAGNOSIS — K859 Acute pancreatitis without necrosis or infection, unspecified: Secondary | ICD-10-CM | POA: Diagnosis not present

## 2022-09-14 DIAGNOSIS — M546 Pain in thoracic spine: Secondary | ICD-10-CM | POA: Diagnosis not present

## 2022-09-14 DIAGNOSIS — Z20822 Contact with and (suspected) exposure to covid-19: Secondary | ICD-10-CM | POA: Diagnosis not present

## 2022-09-17 DIAGNOSIS — M069 Rheumatoid arthritis, unspecified: Secondary | ICD-10-CM | POA: Diagnosis not present

## 2022-09-17 DIAGNOSIS — Z91040 Latex allergy status: Secondary | ICD-10-CM | POA: Diagnosis not present

## 2022-09-17 DIAGNOSIS — M797 Fibromyalgia: Secondary | ICD-10-CM | POA: Diagnosis not present

## 2022-09-17 DIAGNOSIS — F32A Depression, unspecified: Secondary | ICD-10-CM | POA: Diagnosis not present

## 2022-09-17 DIAGNOSIS — Z881 Allergy status to other antibiotic agents status: Secondary | ICD-10-CM | POA: Diagnosis not present

## 2022-09-17 DIAGNOSIS — F17210 Nicotine dependence, cigarettes, uncomplicated: Secondary | ICD-10-CM | POA: Diagnosis not present

## 2022-09-17 DIAGNOSIS — E039 Hypothyroidism, unspecified: Secondary | ICD-10-CM | POA: Diagnosis not present

## 2022-09-17 DIAGNOSIS — Z882 Allergy status to sulfonamides status: Secondary | ICD-10-CM | POA: Diagnosis not present

## 2022-09-17 DIAGNOSIS — M549 Dorsalgia, unspecified: Secondary | ICD-10-CM | POA: Diagnosis not present

## 2022-09-17 DIAGNOSIS — Z9109 Other allergy status, other than to drugs and biological substances: Secondary | ICD-10-CM | POA: Diagnosis not present

## 2022-09-17 DIAGNOSIS — Z885 Allergy status to narcotic agent status: Secondary | ICD-10-CM | POA: Diagnosis not present

## 2022-09-17 DIAGNOSIS — M546 Pain in thoracic spine: Secondary | ICD-10-CM | POA: Diagnosis not present

## 2022-09-17 DIAGNOSIS — Z888 Allergy status to other drugs, medicaments and biological substances status: Secondary | ICD-10-CM | POA: Diagnosis not present

## 2022-09-21 DIAGNOSIS — M546 Pain in thoracic spine: Secondary | ICD-10-CM | POA: Diagnosis not present

## 2022-09-21 DIAGNOSIS — M542 Cervicalgia: Secondary | ICD-10-CM | POA: Diagnosis not present

## 2022-09-21 DIAGNOSIS — M47896 Other spondylosis, lumbar region: Secondary | ICD-10-CM | POA: Diagnosis not present

## 2022-09-21 DIAGNOSIS — M7918 Myalgia, other site: Secondary | ICD-10-CM | POA: Diagnosis not present

## 2022-09-21 DIAGNOSIS — M79671 Pain in right foot: Secondary | ICD-10-CM | POA: Diagnosis not present

## 2022-09-21 DIAGNOSIS — G43719 Chronic migraine without aura, intractable, without status migrainosus: Secondary | ICD-10-CM | POA: Diagnosis not present

## 2022-09-21 DIAGNOSIS — Z79891 Long term (current) use of opiate analgesic: Secondary | ICD-10-CM | POA: Diagnosis not present

## 2022-09-21 DIAGNOSIS — M47812 Spondylosis without myelopathy or radiculopathy, cervical region: Secondary | ICD-10-CM | POA: Diagnosis not present

## 2022-09-26 DIAGNOSIS — M316 Other giant cell arteritis: Secondary | ICD-10-CM | POA: Diagnosis not present

## 2022-09-26 DIAGNOSIS — Z7952 Long term (current) use of systemic steroids: Secondary | ICD-10-CM | POA: Diagnosis not present

## 2022-10-08 DIAGNOSIS — R112 Nausea with vomiting, unspecified: Secondary | ICD-10-CM | POA: Diagnosis not present

## 2022-10-08 DIAGNOSIS — K8500 Idiopathic acute pancreatitis without necrosis or infection: Secondary | ICD-10-CM | POA: Diagnosis not present

## 2022-10-08 DIAGNOSIS — K5900 Constipation, unspecified: Secondary | ICD-10-CM | POA: Diagnosis not present

## 2022-10-08 DIAGNOSIS — K8689 Other specified diseases of pancreas: Secondary | ICD-10-CM | POA: Diagnosis not present

## 2022-11-06 DIAGNOSIS — J9601 Acute respiratory failure with hypoxia: Secondary | ICD-10-CM | POA: Insufficient documentation

## 2022-11-06 DIAGNOSIS — R4182 Altered mental status, unspecified: Secondary | ICD-10-CM | POA: Insufficient documentation

## 2022-11-16 ENCOUNTER — Inpatient Hospital Stay: Payer: Medicare HMO | Admitting: Internal Medicine

## 2022-12-07 ENCOUNTER — Emergency Department: Payer: Medicare HMO

## 2022-12-07 ENCOUNTER — Other Ambulatory Visit: Payer: Self-pay

## 2022-12-07 ENCOUNTER — Inpatient Hospital Stay
Admission: EM | Admit: 2022-12-07 | Discharge: 2022-12-10 | DRG: 917 | Disposition: A | Discharge status: Home Health | Payer: Medicare HMO | Attending: Internal Medicine | Admitting: Internal Medicine

## 2022-12-07 DIAGNOSIS — T402X1A Poisoning by other opioids, accidental (unintentional), initial encounter: Secondary | ICD-10-CM | POA: Diagnosis not present

## 2022-12-07 DIAGNOSIS — K580 Irritable bowel syndrome with diarrhea: Secondary | ICD-10-CM | POA: Diagnosis present

## 2022-12-07 DIAGNOSIS — E039 Hypothyroidism, unspecified: Secondary | ICD-10-CM | POA: Diagnosis present

## 2022-12-07 DIAGNOSIS — M797 Fibromyalgia: Secondary | ICD-10-CM | POA: Diagnosis present

## 2022-12-07 DIAGNOSIS — Z85828 Personal history of other malignant neoplasm of skin: Secondary | ICD-10-CM

## 2022-12-07 DIAGNOSIS — E876 Hypokalemia: Secondary | ICD-10-CM | POA: Diagnosis present

## 2022-12-07 DIAGNOSIS — F17210 Nicotine dependence, cigarettes, uncomplicated: Secondary | ICD-10-CM | POA: Diagnosis present

## 2022-12-07 DIAGNOSIS — Z91048 Other nonmedicinal substance allergy status: Secondary | ICD-10-CM

## 2022-12-07 DIAGNOSIS — G894 Chronic pain syndrome: Secondary | ICD-10-CM | POA: Diagnosis present

## 2022-12-07 DIAGNOSIS — F418 Other specified anxiety disorders: Secondary | ICD-10-CM | POA: Diagnosis present

## 2022-12-07 DIAGNOSIS — Z79890 Hormone replacement therapy (postmenopausal): Secondary | ICD-10-CM

## 2022-12-07 DIAGNOSIS — K219 Gastro-esophageal reflux disease without esophagitis: Secondary | ICD-10-CM | POA: Diagnosis present

## 2022-12-07 DIAGNOSIS — F32A Depression, unspecified: Secondary | ICD-10-CM | POA: Diagnosis present

## 2022-12-07 DIAGNOSIS — E785 Hyperlipidemia, unspecified: Secondary | ICD-10-CM | POA: Diagnosis present

## 2022-12-07 DIAGNOSIS — F331 Major depressive disorder, recurrent, moderate: Secondary | ICD-10-CM | POA: Diagnosis not present

## 2022-12-07 DIAGNOSIS — I10 Essential (primary) hypertension: Secondary | ICD-10-CM | POA: Diagnosis present

## 2022-12-07 DIAGNOSIS — Z79891 Long term (current) use of opiate analgesic: Secondary | ICD-10-CM

## 2022-12-07 DIAGNOSIS — Z6830 Body mass index (BMI) 30.0-30.9, adult: Secondary | ICD-10-CM

## 2022-12-07 DIAGNOSIS — E871 Hypo-osmolality and hyponatremia: Secondary | ICD-10-CM | POA: Diagnosis present

## 2022-12-07 DIAGNOSIS — W19XXXA Unspecified fall, initial encounter: Secondary | ICD-10-CM | POA: Diagnosis present

## 2022-12-07 DIAGNOSIS — Z90710 Acquired absence of both cervix and uterus: Secondary | ICD-10-CM

## 2022-12-07 DIAGNOSIS — M069 Rheumatoid arthritis, unspecified: Secondary | ICD-10-CM | POA: Diagnosis present

## 2022-12-07 DIAGNOSIS — Z885 Allergy status to narcotic agent status: Secondary | ICD-10-CM

## 2022-12-07 DIAGNOSIS — Z981 Arthrodesis status: Secondary | ICD-10-CM

## 2022-12-07 DIAGNOSIS — G9341 Metabolic encephalopathy: Secondary | ICD-10-CM | POA: Diagnosis present

## 2022-12-07 DIAGNOSIS — E669 Obesity, unspecified: Secondary | ICD-10-CM | POA: Diagnosis present

## 2022-12-07 DIAGNOSIS — F419 Anxiety disorder, unspecified: Secondary | ICD-10-CM | POA: Diagnosis present

## 2022-12-07 DIAGNOSIS — Z79899 Other long term (current) drug therapy: Secondary | ICD-10-CM

## 2022-12-07 DIAGNOSIS — E162 Hypoglycemia, unspecified: Secondary | ICD-10-CM | POA: Diagnosis present

## 2022-12-07 DIAGNOSIS — T40601A Poisoning by unspecified narcotics, accidental (unintentional), initial encounter: Secondary | ICD-10-CM | POA: Diagnosis present

## 2022-12-07 DIAGNOSIS — Z882 Allergy status to sulfonamides status: Secondary | ICD-10-CM

## 2022-12-07 HISTORY — DX: Poisoning by other opioids, accidental (unintentional), initial encounter: T40.2X1A

## 2022-12-07 LAB — CBC WITH DIFFERENTIAL/PLATELET
Abs Immature Granulocytes: 0.06 10*3/uL (ref 0.00–0.07)
Basophils Absolute: 0 10*3/uL (ref 0.0–0.1)
Basophils Relative: 0 %
Eosinophils Absolute: 0.7 10*3/uL — ABNORMAL HIGH (ref 0.0–0.5)
Eosinophils Relative: 6 %
HCT: 38.1 % (ref 36.0–46.0)
Hemoglobin: 12.8 g/dL (ref 12.0–15.0)
Immature Granulocytes: 1 %
Lymphocytes Relative: 21 %
Lymphs Abs: 2.6 10*3/uL (ref 0.7–4.0)
MCH: 30 pg (ref 26.0–34.0)
MCHC: 33.6 g/dL (ref 30.0–36.0)
MCV: 89.4 fL (ref 80.0–100.0)
Monocytes Absolute: 0.9 10*3/uL (ref 0.1–1.0)
Monocytes Relative: 7 %
Neutro Abs: 8 10*3/uL — ABNORMAL HIGH (ref 1.7–7.7)
Neutrophils Relative %: 65 %
Platelets: 253 10*3/uL (ref 150–400)
RBC: 4.26 MIL/uL (ref 3.87–5.11)
RDW: 13.6 % (ref 11.5–15.5)
Smear Review: NORMAL
WBC: 12.3 10*3/uL — ABNORMAL HIGH (ref 4.0–10.5)
nRBC: 0 % (ref 0.0–0.2)

## 2022-12-07 LAB — URINE DRUG SCREEN, QUALITATIVE (ARMC ONLY)
Amphetamines, Ur Screen: NOT DETECTED
Barbiturates, Ur Screen: NOT DETECTED
Benzodiazepine, Ur Scrn: NOT DETECTED
Cannabinoid 50 Ng, Ur ~~LOC~~: NOT DETECTED
Cocaine Metabolite,Ur ~~LOC~~: NOT DETECTED
MDMA (Ecstasy)Ur Screen: NOT DETECTED
Methadone Scn, Ur: NOT DETECTED
Opiate, Ur Screen: NOT DETECTED
Phencyclidine (PCP) Ur S: NOT DETECTED
Tricyclic, Ur Screen: POSITIVE — AB

## 2022-12-07 LAB — COMPREHENSIVE METABOLIC PANEL
ALT: 13 U/L (ref 0–44)
AST: 31 U/L (ref 15–41)
Albumin: 1.9 g/dL — ABNORMAL LOW (ref 3.5–5.0)
Alkaline Phosphatase: 119 U/L (ref 38–126)
Anion gap: 8 (ref 5–15)
BUN: 5 mg/dL — ABNORMAL LOW (ref 6–20)
CO2: 26 mmol/L (ref 22–32)
Calcium: 7.6 mg/dL — ABNORMAL LOW (ref 8.9–10.3)
Chloride: 97 mmol/L — ABNORMAL LOW (ref 98–111)
Creatinine, Ser: 0.7 mg/dL (ref 0.44–1.00)
GFR, Estimated: >60 mL/min (ref >60)
Glucose, Bld: 39 mg/dL — CL (ref 70–99)
Potassium: 2.3 mmol/L — CL (ref 3.5–5.1)
Sodium: 131 mmol/L — ABNORMAL LOW (ref 135–145)
Total Bilirubin: 0.7 mg/dL (ref 0.3–1.2)
Total Protein: 5.3 g/dL — ABNORMAL LOW (ref 6.5–8.1)

## 2022-12-07 LAB — LACTIC ACID, PLASMA: Lactic Acid, Venous: 1.9 mmol/L (ref 0.5–1.9)

## 2022-12-07 LAB — BLOOD GAS, VENOUS
Acid-Base Excess: 6.6 mmol/L — ABNORMAL HIGH (ref 0.0–2.0)
Bicarbonate: 27.8 mmol/L (ref 20.0–28.0)
O2 Saturation: 91.8 %
Patient temperature: 37
pCO2, Ven: 29 mmHg — ABNORMAL LOW (ref 44–60)
pH, Ven: 7.59 — ABNORMAL HIGH (ref 7.25–7.43)
pO2, Ven: 55 mmHg — ABNORMAL HIGH (ref 32–45)

## 2022-12-07 LAB — TROPONIN I (HIGH SENSITIVITY)
Troponin I (High Sensitivity): 6 ng/L (ref <18)
Troponin I (High Sensitivity): 5 ng/L (ref <18)

## 2022-12-07 LAB — URINALYSIS, W/ REFLEX TO CULTURE (INFECTION SUSPECTED)
Bilirubin Urine: NEGATIVE
Glucose, UA: 50 mg/dL — AB
Hgb urine dipstick: NEGATIVE
Ketones, ur: NEGATIVE mg/dL
Leukocytes,Ua: NEGATIVE
Nitrite: NEGATIVE
Protein, ur: NEGATIVE mg/dL
Specific Gravity, Urine: 1.003 — ABNORMAL LOW (ref 1.005–1.030)
pH: 6 (ref 5.0–8.0)

## 2022-12-07 LAB — PHOSPHORUS: Phosphorus: 3.3 mg/dL (ref 2.5–4.6)

## 2022-12-07 LAB — MAGNESIUM: Magnesium: 1.7 mg/dL (ref 1.7–2.4)

## 2022-12-07 LAB — CBG MONITORING, ED
Glucose-Capillary: 39 mg/dL — CL (ref 70–99)
Glucose-Capillary: 211 mg/dL — ABNORMAL HIGH (ref 70–99)
Glucose-Capillary: 44 mg/dL — CL (ref 70–99)

## 2022-12-07 LAB — LIPASE, BLOOD: Lipase: 36 U/L (ref 11–51)

## 2022-12-07 MED ORDER — SODIUM CHLORIDE 0.9 % IV BOLUS
1000.0000 mL | Freq: Once | INTRAVENOUS | Status: AC
  Administered 2022-12-07: 1000 mL via INTRAVENOUS

## 2022-12-07 MED ORDER — POTASSIUM CHLORIDE 20 MEQ PO PACK: 80.0000 meq | PACK | Freq: Every day | ORAL | Status: DC

## 2022-12-07 MED ORDER — POTASSIUM CHLORIDE 10 MEQ/100ML IV SOLN
10.0000 meq | INTRAVENOUS | Status: AC
  Administered 2022-12-07 (×2): 10 meq via INTRAVENOUS
  Filled 2022-12-07 (×2): qty 100

## 2022-12-07 MED ORDER — DEXTROSE-NACL 5-0.45 % IV SOLN: INTRAVENOUS | Status: DC

## 2022-12-07 MED ORDER — DEXTROSE-NACL 10-0.45 % IV SOLN
INTRAVENOUS | Status: DC
  Filled 2022-12-07 (×11): qty 1000

## 2022-12-07 MED ORDER — DEXTROSE 10 % IV BOLUS
500.0000 mL | Freq: Once | INTRAVENOUS | Status: AC
  Administered 2022-12-07: 500 mL via INTRAVENOUS

## 2022-12-07 MED ORDER — DEXTROSE 10 % IV BOLUS
250.0000 mL | Freq: Once | INTRAVENOUS | Status: AC
  Administered 2022-12-07: 250 mL via INTRAVENOUS
  Filled 2022-12-07: qty 500

## 2022-12-07 NOTE — ED Provider Notes (Signed)
[name] Provider Note    Event Date/Time   First MD Initiated Contact with Patient 12/07/22 2036     (approximate)   History   Hypoglycemia and Shortness of Breath   HPI  Abigail [name] is a 57 y.o. female   Past medical history of fibromyalgia, on chronic opioids, recent admission in [facility] for altered mental status found to be altered mental status likely due to polypharmacy and hypoglycemic/hypokalemia due to poor p.o. intake who presents to the emergency department with altered mental status unresponsiveness from home with report by EMS that she responded to Narcan and was found to be hypoglycemic and given D10 en route.  She states that she went to an orthopedist today and got a ketorolac shot and took an extra dose of her oxycodone.  She denies suicidality or suicide attempt.  She has no other acute medical complaints.   Independent Historian contributed to assessment above: EMS  External Medical Documents Reviewed: Hospital admission note from 11/04/2022 from [facility] for altered mental status due to polypharmacy and hypoglycemia      Physical Exam   Triage Vital Signs: ED Triage Vitals  Enc Vitals Group     BP      Pulse      Resp      Temp      Temp src      SpO2      Weight      Height      Head Circumference      Peak Flow      Pain Score      Pain Loc      Pain Edu?      Excl. in GC?     Most recent vital signs: Vitals:   12/07/22 2300 12/07/22 2320  BP: 101/74 94/65  Pulse: 74 68  Resp: 20 (!) 22  Temp:    SpO2: 96% 98%    General: Awake, no distress.  CV:  Good peripheral perfusion.  Resp:  Normal effort.  Abd:  No distention.  Other:  Awake somnolent answering questions appropriately but falling back asleep.  Soft nontender abdomen and clear lungs.  Skin appears warm and well-perfused and he has normal hemodynamics, afebrile, no meningismus no signs of head trauma   ED Results / Procedures / Treatments    Labs (all labs ordered are listed, but only abnormal results are displayed) Labs Reviewed  COMPREHENSIVE METABOLIC PANEL - Abnormal; Notable for the following components:      Result Value   Sodium 131 (*)    Potassium 2.3 (*)    Chloride 97 (*)    Glucose, Bld 39 (*)    BUN 5 (*)    Calcium 7.6 (*)    Total Protein 5.3 (*)    Albumin 1.9 (*)    All other components within normal limits  CBC WITH DIFFERENTIAL/PLATELET - Abnormal; Notable for the following components:   WBC 12.3 (*)    Neutro Abs 8.0 (*)    Eosinophils Absolute 0.7 (*)    All other components within normal limits  URINALYSIS, W/ REFLEX TO CULTURE (INFECTION SUSPECTED) - Abnormal; Notable for the following components:   Color, Urine YELLOW (*)    APPearance CLEAR (*)    Specific Gravity, Urine 1.003 (*)    Glucose, UA 50 (*)    Bacteria, UA RARE (*)    All other components within normal limits  BLOOD GAS, VENOUS - Abnormal; Notable for the  following components:   pH, Ven 7.59 (*)    pCO2, Ven 29 (*)    pO2, Ven 55 (*)    Acid-Base Excess 6.6 (*)    All other components within normal limits  URINE DRUG SCREEN, QUALITATIVE ([facility] ONLY) - Abnormal; Notable for the following components:   Tricyclic, Ur Screen POSITIVE (*)    All other components within normal limits  CBG MONITORING, ED - Abnormal; Notable for the following components:   Glucose-Capillary 39 (*)    All other components within normal limits  CBG MONITORING, ED - Abnormal; Notable for the following components:   Glucose-Capillary 211 (*)    All other components within normal limits  CBG MONITORING, ED - Abnormal; Notable for the following components:   Glucose-Capillary 44 (*)    All other components within normal limits  LACTIC ACID, PLASMA  LIPASE, BLOOD  MAGNESIUM  PHOSPHORUS  CBG MONITORING, ED  CBG MONITORING, ED  TROPONIN I (HIGH SENSITIVITY)  TROPONIN I (HIGH SENSITIVITY)     I ordered and reviewed the above labs they are  notable for her potassium is low, her glucose is low, and she has mildly elevated white blood cell count  EKG  ED ECG REPORT I, Pilar Jarvis, the attending physician, personally viewed and interpreted this ECG.   Date: 12/07/2022  EKG Time: 2045  Rate: 84  Rhythm: sinus  Axis: nl  Intervals:none  ST&T Change: no stemi    RADIOLOGY I independently reviewed and interpreted CT of the head see no obvious bleeding or midline shift   PROCEDURES:  Critical Care performed: No  Procedures   MEDICATIONS ORDERED IN ED: Medications  potassium chloride 10 mEq in 100 mL IVPB (10 mEq Intravenous New Bag/Given 12/07/22 2338)  potassium chloride (KLOR-CON) packet 80 mEq (has no administration in time range)  sodium chloride 0.9 % bolus 1,000 mL (has no administration in time range)  dextrose (D10W) 10% bolus 250 mL (has no administration in time range)  dextrose 5 %-0.45 % sodium chloride infusion (has no administration in time range)  dextrose (D10W) 10% bolus 500 mL (0 mLs Intravenous Stopped 12/07/22 2126)  sodium chloride 0.9 % bolus 1,000 mL (0 mLs Intravenous Stopped 12/07/22 2328)    External physician / consultants:  I spoke with hospitalist regarding admission and regarding care plan for this patient.   IMPRESSION / MDM / ASSESSMENT AND PLAN / ED COURSE  I reviewed the triage vital signs and the nursing notes.                                Patient's presentation is most consistent with acute presentation with potential threat to life or bodily function.  Differential diagnosis includes, but is not limited to, hypoglycemia, electrolyte disturbance, opioid overdose, intoxication, intracranial bleeding, ACS or dysrhythmia, infection   The patient is on the cardiac monitor to evaluate for evidence of arrhythmia and/or significant heart rate changes.  MDM: This is a patient with altered mental status with reported response to Narcan and is unknown opioids.  Considered opioid  overdose but also found to be hypoglycemic with good response to D10 but recurrence in the emergency department shortly thereafter down to the 30s and 40s.  Continues to be altered.  Now ordered for D5 continuous infusion.  Also severely hypokalemic ordered for IV repletion.  I ordered a CT of the head to assess for intracranial bleeding given her altered mental status  and found down.  Fortunately this is negative.  Given her recurrent hypoglycemia I paged hospitalist for admission.        FINAL CLINICAL IMPRESSION(S) / ED DIAGNOSES   Final diagnoses:  Opioid overdose, accidental or unintentional, initial encounter  Hypoglycemia  Hypokalemia     Rx / DC Orders   ED Discharge Orders     None        Note:  This document was prepared using Dragon voice recognition software and may include unintentional dictation errors.    Pilar Jarvis, MD 12/07/22 [phone]

## 2022-12-07 NOTE — ED Notes (Signed)
Patient was already treated for the low blood glucose called by the lab.  Her blood sugar has been corrected, and currently reads 211 by CBG.

## 2022-12-07 NOTE — ED Triage Notes (Signed)
Patient was arrives from home by EMS.  She was seen at an ortho clinic today for a toradol shot for back pain.  Patient takes  Oxycodone 5 times per day.  Patient states that she took her prescribed dose only.  Patient was given  of narcan on scene, and patient was revived from a nonresponsive state.  EMS reports blood sugar of 35 initially.  She was given of D10.  Blood sugar went to 120, but back down to 75 on arrival.  On scene oxygen saturation was 76% but came to 98% 5LNC with EMS.  Patient currently 98% on room air.  Patient continually screams that she needs help, and that rather than getting help, people are trying to kill her. Patient also given  zofran by EMS.

## 2022-12-08 ENCOUNTER — Inpatient Hospital Stay: Payer: Medicare HMO

## 2022-12-08 DIAGNOSIS — E162 Hypoglycemia, unspecified: Secondary | ICD-10-CM | POA: Diagnosis present

## 2022-12-08 DIAGNOSIS — Z79891 Long term (current) use of opiate analgesic: Secondary | ICD-10-CM | POA: Diagnosis not present

## 2022-12-08 DIAGNOSIS — E876 Hypokalemia: Secondary | ICD-10-CM | POA: Diagnosis present

## 2022-12-08 DIAGNOSIS — M797 Fibromyalgia: Secondary | ICD-10-CM | POA: Diagnosis present

## 2022-12-08 DIAGNOSIS — Z90710 Acquired absence of both cervix and uterus: Secondary | ICD-10-CM | POA: Diagnosis not present

## 2022-12-08 DIAGNOSIS — E785 Hyperlipidemia, unspecified: Secondary | ICD-10-CM | POA: Diagnosis present

## 2022-12-08 DIAGNOSIS — Z885 Allergy status to narcotic agent status: Secondary | ICD-10-CM | POA: Diagnosis not present

## 2022-12-08 DIAGNOSIS — M069 Rheumatoid arthritis, unspecified: Secondary | ICD-10-CM | POA: Diagnosis present

## 2022-12-08 DIAGNOSIS — Z85828 Personal history of other malignant neoplasm of skin: Secondary | ICD-10-CM | POA: Diagnosis not present

## 2022-12-08 DIAGNOSIS — E669 Obesity, unspecified: Secondary | ICD-10-CM | POA: Diagnosis present

## 2022-12-08 DIAGNOSIS — Z981 Arthrodesis status: Secondary | ICD-10-CM | POA: Diagnosis not present

## 2022-12-08 DIAGNOSIS — E039 Hypothyroidism, unspecified: Secondary | ICD-10-CM | POA: Diagnosis present

## 2022-12-08 DIAGNOSIS — K580 Irritable bowel syndrome with diarrhea: Secondary | ICD-10-CM | POA: Diagnosis present

## 2022-12-08 DIAGNOSIS — W19XXXA Unspecified fall, initial encounter: Secondary | ICD-10-CM | POA: Diagnosis present

## 2022-12-08 DIAGNOSIS — Z79890 Hormone replacement therapy (postmenopausal): Secondary | ICD-10-CM | POA: Diagnosis not present

## 2022-12-08 DIAGNOSIS — E871 Hypo-osmolality and hyponatremia: Secondary | ICD-10-CM | POA: Diagnosis present

## 2022-12-08 DIAGNOSIS — F331 Major depressive disorder, recurrent, moderate: Secondary | ICD-10-CM | POA: Diagnosis not present

## 2022-12-08 DIAGNOSIS — T402X1A Poisoning by other opioids, accidental (unintentional), initial encounter: Secondary | ICD-10-CM | POA: Diagnosis present

## 2022-12-08 DIAGNOSIS — F17210 Nicotine dependence, cigarettes, uncomplicated: Secondary | ICD-10-CM | POA: Diagnosis present

## 2022-12-08 DIAGNOSIS — G894 Chronic pain syndrome: Secondary | ICD-10-CM | POA: Diagnosis present

## 2022-12-08 DIAGNOSIS — I10 Essential (primary) hypertension: Secondary | ICD-10-CM | POA: Diagnosis present

## 2022-12-08 DIAGNOSIS — F419 Anxiety disorder, unspecified: Secondary | ICD-10-CM | POA: Diagnosis present

## 2022-12-08 DIAGNOSIS — G9341 Metabolic encephalopathy: Secondary | ICD-10-CM | POA: Diagnosis present

## 2022-12-08 DIAGNOSIS — F32A Depression, unspecified: Secondary | ICD-10-CM | POA: Diagnosis present

## 2022-12-08 DIAGNOSIS — K219 Gastro-esophageal reflux disease without esophagitis: Secondary | ICD-10-CM | POA: Diagnosis present

## 2022-12-08 DIAGNOSIS — Z882 Allergy status to sulfonamides status: Secondary | ICD-10-CM | POA: Diagnosis not present

## 2022-12-08 LAB — COMPREHENSIVE METABOLIC PANEL
ALT: 14 U/L (ref 0–44)
AST: 25 U/L (ref 15–41)
Albumin: 1.9 g/dL — ABNORMAL LOW (ref 3.5–5.0)
Alkaline Phosphatase: 119 U/L (ref 38–126)
Anion gap: 7 (ref 5–15)
BUN: <5 mg/dL — ABNORMAL LOW (ref 6–20)
CO2: 28 mmol/L (ref 22–32)
Calcium: 7.4 mg/dL — ABNORMAL LOW (ref 8.9–10.3)
Chloride: 105 mmol/L (ref 98–111)
Creatinine, Ser: 0.5 mg/dL (ref 0.44–1.00)
GFR, Estimated: >60 mL/min (ref >60)
Glucose, Bld: 49 mg/dL — ABNORMAL LOW (ref 70–99)
Potassium: 2.3 mmol/L — CL (ref 3.5–5.1)
Sodium: 140 mmol/L (ref 135–145)
Total Bilirubin: 0.5 mg/dL (ref 0.3–1.2)
Total Protein: 5.2 g/dL — ABNORMAL LOW (ref 6.5–8.1)

## 2022-12-08 LAB — TSH: TSH: 1.573 u[IU]/mL (ref 0.350–4.500)

## 2022-12-08 LAB — CBC
HCT: 41.2 % (ref 36.0–46.0)
Hemoglobin: 13.1 g/dL (ref 12.0–15.0)
MCH: 29.6 pg (ref 26.0–34.0)
MCHC: 31.8 g/dL (ref 30.0–36.0)
MCV: 93 fL (ref 80.0–100.0)
Platelets: 192 10*3/uL (ref 150–400)
RBC: 4.43 MIL/uL (ref 3.87–5.11)
RDW: 13.7 % (ref 11.5–15.5)
WBC: 8.3 10*3/uL (ref 4.0–10.5)
nRBC: 0 % (ref 0.0–0.2)

## 2022-12-08 LAB — CBG MONITORING, ED
Glucose-Capillary: 100 mg/dL — ABNORMAL HIGH (ref 70–99)
Glucose-Capillary: 100 mg/dL — ABNORMAL HIGH (ref 70–99)
Glucose-Capillary: 46 mg/dL — ABNORMAL LOW (ref 70–99)
Glucose-Capillary: 46 mg/dL — ABNORMAL LOW (ref 70–99)
Glucose-Capillary: 49 mg/dL — ABNORMAL LOW (ref 70–99)
Glucose-Capillary: 58 mg/dL — ABNORMAL LOW (ref 70–99)
Glucose-Capillary: 67 mg/dL — ABNORMAL LOW (ref 70–99)
Glucose-Capillary: 68 mg/dL — ABNORMAL LOW (ref 70–99)
Glucose-Capillary: 68 mg/dL — ABNORMAL LOW (ref 70–99)
Glucose-Capillary: 71 mg/dL (ref 70–99)
Glucose-Capillary: 72 mg/dL (ref 70–99)
Glucose-Capillary: 76 mg/dL (ref 70–99)

## 2022-12-08 LAB — GLUCOSE, CAPILLARY
Glucose-Capillary: 101 mg/dL — ABNORMAL HIGH (ref 70–99)
Glucose-Capillary: 95 mg/dL (ref 70–99)

## 2022-12-08 LAB — HIV ANTIBODY (ROUTINE TESTING W REFLEX): HIV Screen 4th Generation wRfx: NONREACTIVE

## 2022-12-08 LAB — MAGNESIUM: Magnesium: 2 mg/dL (ref 1.7–2.4)

## 2022-12-08 MED ORDER — NICOTINE 21 MG/24HR TD PT24
21.0000 mg | MEDICATED_PATCH | Freq: Every day | TRANSDERMAL | Status: DC
  Administered 2022-12-08 – 2022-12-10 (×3): 21 mg via TRANSDERMAL
  Filled 2022-12-08 (×3): qty 1

## 2022-12-08 MED ORDER — ACETAMINOPHEN 650 MG RE SUPP: 650.0000 mg | Freq: Four times a day (QID) | RECTAL | Status: DC | PRN

## 2022-12-08 MED ORDER — POTASSIUM CHLORIDE 10 MEQ/100ML IV SOLN
10.0000 meq | INTRAVENOUS | Status: DC
  Administered 2022-12-08: 10 meq via INTRAVENOUS
  Filled 2022-12-08: qty 100

## 2022-12-08 MED ORDER — DEXTROSE 50 % IV SOLN
1.0000 | Freq: Once | INTRAVENOUS | Status: AC
  Administered 2022-12-08: 50 mL via INTRAVENOUS
  Filled 2022-12-08: qty 50

## 2022-12-08 MED ORDER — MORPHINE SULFATE ER 15 MG PO TBCR: 15.0000 mg | EXTENDED_RELEASE_TABLET | Freq: Two times a day (BID) | ORAL | Status: DC

## 2022-12-08 MED ORDER — ALBUTEROL SULFATE (2.5 MG/3ML) 0.083% IN NEBU
2.5000 mg | INHALATION_SOLUTION | Freq: Four times a day (QID) | RESPIRATORY_TRACT | Status: DC | PRN
  Administered 2022-12-08: 2.5 mg via RESPIRATORY_TRACT
  Filled 2022-12-08: qty 3

## 2022-12-08 MED ORDER — GABAPENTIN 300 MG PO CAPS
300.0000 mg | ORAL_CAPSULE | Freq: Three times a day (TID) | ORAL | Status: DC
  Administered 2022-12-08 – 2022-12-10 (×6): 300 mg via ORAL
  Filled 2022-12-08 (×6): qty 1

## 2022-12-08 MED ORDER — VITAMIN D 25 MCG (1000 UNIT) PO TABS
500.0000 [IU] | ORAL_TABLET | Freq: Every day | ORAL | Status: DC
  Administered 2022-12-08 – 2022-12-10 (×3): 500 [IU] via ORAL
  Filled 2022-12-08 (×2): qty 1
  Filled 2022-12-08: qty 0.5
  Filled 2022-12-08: qty 1

## 2022-12-08 MED ORDER — OXYCODONE HCL 5 MG PO TABS
20.0000 mg | ORAL_TABLET | Freq: Every day | ORAL | Status: DC | PRN
  Administered 2022-12-08 – 2022-12-10 (×9): 20 mg via ORAL
  Filled 2022-12-08 (×10): qty 4

## 2022-12-08 MED ORDER — POTASSIUM CHLORIDE 10 MEQ/100ML IV SOLN
10.0000 meq | INTRAVENOUS | Status: AC
  Administered 2022-12-08 (×6): 10 meq via INTRAVENOUS
  Filled 2022-12-08 (×6): qty 100

## 2022-12-08 MED ORDER — OXYCODONE HCL 5 MG PO TABS: 20.0000 mg | ORAL_TABLET | Freq: Every day | ORAL | Status: DC | PRN

## 2022-12-08 MED ORDER — ONDANSETRON HCL 4 MG PO TABS
4.0000 mg | ORAL_TABLET | Freq: Four times a day (QID) | ORAL | Status: DC | PRN
  Administered 2022-12-09: 4 mg via ORAL
  Filled 2022-12-08: qty 1

## 2022-12-08 MED ORDER — GABAPENTIN 300 MG PO CAPS: 300.0000 mg | ORAL_CAPSULE | Freq: Three times a day (TID) | ORAL | Status: DC

## 2022-12-08 MED ORDER — ENOXAPARIN SODIUM 40 MG/0.4ML IJ SOSY
40.0000 mg | PREFILLED_SYRINGE | INTRAMUSCULAR | Status: DC
  Administered 2022-12-08 – 2022-12-10 (×3): 40 mg via SUBCUTANEOUS
  Filled 2022-12-08 (×3): qty 0.4

## 2022-12-08 MED ORDER — DULOXETINE HCL 30 MG PO CPEP
60.0000 mg | ORAL_CAPSULE | Freq: Two times a day (BID) | ORAL | Status: DC
  Administered 2022-12-08 – 2022-12-10 (×5): 60 mg via ORAL
  Filled 2022-12-08 (×5): qty 2

## 2022-12-08 MED ORDER — BUPROPION HCL ER (XL) 150 MG PO TB24
150.0000 mg | ORAL_TABLET | Freq: Every day | ORAL | Status: DC
  Administered 2022-12-08 – 2022-12-10 (×3): 150 mg via ORAL
  Filled 2022-12-08 (×3): qty 1

## 2022-12-08 MED ORDER — POTASSIUM CHLORIDE CRYS ER 20 MEQ PO TBCR
40.0000 meq | EXTENDED_RELEASE_TABLET | Freq: Once | ORAL | Status: AC
  Administered 2022-12-08: 40 meq via ORAL
  Filled 2022-12-08: qty 2

## 2022-12-08 MED ORDER — THIAMINE MONONITRATE 100 MG PO TABS
100.0000 mg | ORAL_TABLET | Freq: Every day | ORAL | Status: DC
  Administered 2022-12-08 – 2022-12-10 (×3): 100 mg via ORAL
  Filled 2022-12-08 (×3): qty 1

## 2022-12-08 MED ORDER — POLYETHYLENE GLYCOL 3350 17 G PO PACK: 17.0000 g | PACK | Freq: Two times a day (BID) | ORAL | Status: DC | PRN

## 2022-12-08 MED ORDER — ONDANSETRON HCL 4 MG/2ML IJ SOLN: 4.0000 mg | Freq: Four times a day (QID) | INTRAMUSCULAR | Status: DC | PRN

## 2022-12-08 MED ORDER — LIDOCAINE 5 % EX PTCH
1.0000 | MEDICATED_PATCH | CUTANEOUS | Status: DC
  Administered 2022-12-08 – 2022-12-09 (×2): 1 via TRANSDERMAL
  Filled 2022-12-08 (×3): qty 1

## 2022-12-08 MED ORDER — ACETAMINOPHEN 325 MG PO TABS
650.0000 mg | ORAL_TABLET | Freq: Four times a day (QID) | ORAL | Status: DC | PRN
  Administered 2022-12-08 – 2022-12-09 (×2): 650 mg via ORAL
  Filled 2022-12-08 (×2): qty 2

## 2022-12-08 MED ORDER — POTASSIUM CHLORIDE CRYS ER 20 MEQ PO TBCR
40.0000 meq | EXTENDED_RELEASE_TABLET | ORAL | Status: AC
  Administered 2022-12-08 (×2): 40 meq via ORAL
  Filled 2022-12-08 (×2): qty 2

## 2022-12-08 MED ORDER — PANTOPRAZOLE SODIUM 40 MG PO TBEC
40.0000 mg | DELAYED_RELEASE_TABLET | Freq: Every day | ORAL | Status: DC
  Administered 2022-12-08 – 2022-12-10 (×3): 40 mg via ORAL
  Filled 2022-12-08 (×3): qty 1

## 2022-12-08 MED ORDER — OXYCODONE HCL 5 MG PO TABS: 10.0000 mg | ORAL_TABLET | Freq: Four times a day (QID) | ORAL | Status: DC | PRN

## 2022-12-08 MED ORDER — DEXTROSE 50 % IV SOLN
25.0000 g | INTRAVENOUS | Status: AC
  Administered 2022-12-08: 25 g via INTRAVENOUS
  Filled 2022-12-08: qty 50

## 2022-12-08 NOTE — Assessment & Plan Note (Signed)
Continue PPI ?

## 2022-12-08 NOTE — Assessment & Plan Note (Signed)
Unclear etiology.  Patient with poor p.o. intake for the past week.  No history of diabetes.  Patient denies any illicit or inappropriate drug use.  She denies any suicidal thoughts or ideations. Stating that she was not feeling hungry for more than a week with very poor p.o. intake.  No recent illnesses. -Checking hypoglycemia labs which include insulin-C-peptide and sulfonylurea. -Monitor CBG -Continue with D10

## 2022-12-08 NOTE — Assessment & Plan Note (Signed)
Patient stopped taking her Synthroid. -Check TSH -Will resume Synthroid after lab

## 2022-12-08 NOTE — Assessment & Plan Note (Signed)
Blood pressure within goal. -Holding home HCTZ as she appears dry

## 2022-12-08 NOTE — Assessment & Plan Note (Signed)
Per patient she has stopped taking her methotrexate. -Outpatient follow-up with rheumatology

## 2022-12-08 NOTE — Discharge Instructions (Signed)
[facility]  Mental health service in [place]  Address: [address] [place], [place] [address] Hours:  Monday through Friday 8- 4:30 pm, walk-ins also Phone: [phone]  Food Resources  Agency Name: [facility] Agency Address: [address], [place], [place] [address] Phone: [phone] Website: [email or web address]  Service(s) Offered: Housing services, self-sufficiency, congregate meal  program, weatherization program, Field seismologist program, emergency food assistance,  housing counseling, home ownership program, wheels -towork program. Meals free for 60 and older at various  locations from 9am-1pm, Monday-Friday:  [employer], [address] [place], [phone] [facility], [address], [place] [phone]   [employer] [facility], [address],   [place] [phone] [address], [address]   [place], [phone]  Agency Name: [facility] Address: [phone] [address], [place], [place] [address] Phone: [phone] Website: www.[email or web address] Service(s) Offered: Home delivered hot, frozen, and emergency  meals. Grocery assistance program which matches  volunteers one-on-one with seniors unable to grocery shop  for themselves. Must be 60 years and older; less than 20  hours of in-home aide service, limited or no driving ability;  live alone or with someone with a disability; live in  [place].  Agency Name: [facility] [employer]) Address: [address] [place], [place] [address] Phone: [phone] Service(s) Offered: Food is served to shut-ins, homeless, elderly, and low  income people in the community every Saturday (11:30  am-12:30 pm) and Sunday (12:30 pm-1:30pm). Volunteers  also offer help and encouragement in seeking employment,  and spiritual guidance. December 13, 2016  8  Agency Name: Department of Social Services Address: [address] [place], [place] [address] Phone: [phone] Service(s) Offered: Child support services; child welfare services; food stamps;  Medicaid; work first family assistance; and aid with fuel,  rent, food and medicine.  Agency Name: [employer]itian Address: [address], [place], [place] Phone: [phone] Website: [email or web address].dreamalign.com Services Offered: Monday 10:00am-12:00, 8:00pm-9:00pm, and Friday  10:00am-12:00. Agency Name: [employer] [facility] [place] Address: [address] [place], [place] [address] Phone: [phone] Website: [email or web address] Service(s) Offered: Serves weekday meals, open from 11:30 am- 1:00 pm., and  6:30-7:30pm, Monday-Wednesday-Friday distributes food  3:30-6pm, Monday-Wednesday-Friday.  Agency Name: [facility] Address: [address] [place], [place] Phone: [phone] Website: [email or web address] Services Offered: Distributes food the 4th Saturday of the month, starting at  8:00 am Agency Name: [facility] Address: [phone] [address] [place], [place] [address] Phone: [phone] Website: http://hbc..net Service(s) Offered: Bread of life, weekly food pantry. Open Wednesdays from  10:00am-noon.  Agency Name: [facility] [employer] Address: [address] [place], [place], [place] Phone: [phone] Services Offered: Distributes food 9am-1pm, Monday-Thursday. Call for details.   Agency Name: [facility] Address: [address] [place], [place] [address] Phone: [phone] Website: [email or web address] Service(s) Offered: Games developer. Call for assistance. Agency Name: [facility] Address: [address], [place], [place] [address] Phone: [phone] Service Offered: Emergency Food Pantry. Call for appointment.  Agency Name:  [facility] Address: [address], [place], [place] [address] Phone: [phone] Website: [email or web address] Services Offered: Games developer. Call for details Agency Name: [facility] Address: [address] [place], [place] Phone: [phone] Website: [email or web address] Service(s) Offered: Emergency Food Pantry. Call for details.  Agency Name: Holiday representative Address: [address] [place], [place] [address] Phone:  [phone] or [phone] Website: [email or web address] Service(s) Offered: Distribute food 9am-11:30 am, Tuesday-Friday, and 1- 3:30pm, Monday-Friday. Food pantry Monday-Friday  1pm-3pm, fresh items, Mon.-Wed.-Fri.  Agency Name: [facility] Address: [address] [place], [place] [address] Phone: [phone] Website: [email or web address] Services Offered: Distribute food Tues and Sats from 9:00am-noon. Closed  1st Saturday of each month. Call for details

## 2022-12-08 NOTE — ED Notes (Signed)
Advised nurse that patient has ready bed 

## 2022-12-08 NOTE — Progress Notes (Signed)
Progress Note   Patient: Abigail [name] [facility] ZOX:[id number] DOB: 30-Aug-1965 DOA: 12/07/2022     0 DOS: the patient was seen and examined on 12/08/2022   Brief hospital course: Taken from H&P.  [facility] is a 57 y.o. female with medical history significant of GERD, rheumatoid arthritis, chronic narcotic abuse, fibromyalgia, chronic pain syndrome, essential hypertension, hypothyroidism, degenerative disc disease, who was brought in with altered mental status.  Patient suspected to have had inadvertent drug overdose.  She takes high-dose MS Contin and oxycodone.    On arrival she was found to have blood sugar of 39.  She has shortness of breath.  Patient has received doses of glucose but no significant improvement.  Blood sugar has dropped despite D10.  At this point started on D10 drip and patient maintained on the drip so being admitted for further evaluation.   She was also found to have significant hypokalemia and mild hyponatremia.  CBC with mild leukocytosis, magnesium 1.7, UA negative for UTI, UDS positive for tricyclic, ironically negative for opioids.  4/20: Vital stable, CBG again dropped to 49 requiring a D50.  D10 rate increased to 200 mL/h.  Potassium remained at 2.3 which is being repleted.  Magnesium 2.  Sodium normalized at 140 now. Checking hypoglycemia labs. Patient feels depressed with poor p.o. intake for the past 1 week.  Having some diarrhea today, no nausea or vomiting.  Patient denied any suicidal thoughts but appears depressed.  Psych was also consulted for evaluation. She was complaining of right knee pain due to fall, imaging was obtained and it was negative for any acute abnormality.  Patient denies any other recent illness just saying that she was not feeling hungry for a while.   Assessment and Plan: * Hypoglycemia Unclear etiology.  Patient with poor p.o. intake for the past week.  No history of diabetes.  Patient denies any illicit or  inappropriate drug use.  She denies any suicidal thoughts or ideations. Stating that she was not feeling hungry for more than a week with very poor p.o. intake.  No recent illnesses. -Checking hypoglycemia labs which include insulin-C-peptide and sulfonylurea. -Monitor CBG -Continue with D10  Acute metabolic encephalopathy Most likely secondary to hypoglycemia.  Improved. CT head negative for any acute abnormality. -Continue to monitor  Chronic prescription opiate use Patient with history of chronic lower back pain for which she uses pretty high-dose oxycodone.  Per patient she takes her pain medications regularly, ironically UDS was negative for opioids and only shows tricyclic. -Continue home meds  RA (rheumatoid arthritis) Per patient she has stopped taking her methotrexate. -Outpatient follow-up with rheumatology  Essential hypertension Blood pressure within goal. -Holding home HCTZ as she appears dry  Hypothyroidism Patient stopped taking her Synthroid. -Check TSH -Will resume Synthroid after lab  Hypokalemia Persistent hypokalemia.  Magnesium improved to 2 -Replace potassium and monitor  Gastroesophageal reflux disease -Continue PPI  Depression Continue home Cymbalta and Wellbutrin   Subjective: Patient was feeling little depressed when seen today.  Able to answer questions appropriately.  Stating that she has no appetite for the past many days and was not eating.  Denies any suicidal thoughts.  She denies using any illicit or inappropriate drugs.  Agrees with psych evaluation  Physical Exam: Vitals:   12/08/22 1104 12/08/22 1200 12/08/22 1230 12/08/22 1330  BP:  101/74 121/66 122/62  Pulse:  99 95 93  Resp:  (!) 25 (!) 23 17  Temp: 98.2 F (36.8 C)  TempSrc: Oral     SpO2:  98% 98% 100%  Weight:      Height:       General.  Overweight lady, in no acute distress. Pulmonary.  Lungs clear bilaterally, normal respiratory effort. CV.  Regular rate and  rhythm, no JVD, rub or murmur. Abdomen.  Soft, nontender, nondistended, BS positive. CNS.  Alert and oriented .  No focal neurologic deficit. Extremities.  No edema, no cyanosis, pulses intact and symmetrical. Psychiatry.  Judgment and insight appears normal.   Data Reviewed: Prior data reviewed  Family Communication: Discussed with patient  Disposition: Status is: Inpatient Remains inpatient appropriate because: Severity of illness  Planned Discharge Destination: Home  DVT prophylaxis.  Lovenox Time spent:  minutes  This record has been created using Conservation officer, historic buildings. Errors have been sought and corrected,but may not always be located. Such creation errors do not reflect on the standard of care.   Author: Arnetha Courser, MD 12/08/2022 2:46 PM  For on call review [email or web address].

## 2022-12-08 NOTE — ED Notes (Signed)
A call was placed to blood bank and spoke to Robin to confirm that the antibody testing is still ongoing.  She said they will call when everything is complete.

## 2022-12-08 NOTE — Assessment & Plan Note (Signed)
Persistent hypokalemia.  Magnesium improved to 2 -Replace potassium and monitor

## 2022-12-08 NOTE — Hospital Course (Addendum)
Taken from H&P.  Abigail [name] is a 57 y.o. female with medical history significant of GERD, rheumatoid arthritis, chronic narcotic abuse, fibromyalgia, chronic pain syndrome, essential hypertension, hypothyroidism, degenerative disc disease, who was brought in with altered mental status.  Patient suspected to have had inadvertent drug overdose.  She takes high-dose MS Contin and oxycodone.    On arrival she was found to have blood sugar of 39.  She has shortness of breath.  Patient has received doses of glucose but no significant improvement.  Blood sugar has dropped despite D10.  At this point started on D10 drip and patient maintained on the drip so being admitted for further evaluation.   She was also found to have significant hypokalemia and mild hyponatremia.  CBC with mild leukocytosis, magnesium 1.7, UA negative for UTI, UDS positive for tricyclic, ironically negative for opioids.  4/20: Vital stable, CBG again dropped to 49 requiring a D50.  D10 rate increased to 200 mL/h.  Potassium remained at 2.3 which is being repleted.  Magnesium 2.  Sodium normalized at 140 now. Checking hypoglycemia labs. Patient feels depressed with poor p.o. intake for the past 1 week.  Having some diarrhea today, no nausea or vomiting.  Patient denied any suicidal thoughts but appears depressed.  Psych was also consulted for evaluation. She was complaining of right knee pain due to fall, imaging was obtained and it was negative for any acute abnormality.  Patient denies any other recent illness just saying that she was not feeling hungry for a while.  4/21: CBG stabilizes.  Hypoglycemia labs pending.  P.o. intake remained poor.  TSH normal. A.m. cortisol 7.1 which is low normal.  4/22: Vitals and labs stable.  Blood glucose is maintaining mostly in 80s.  Hypoglycemia labs are still pending which can be followed up by PCP.  Patient need encouragement for regular p.o. intake.  Patient was also instructed  to slowly decrease her dose of opioids which she was taking for her chronic pain.  She should be taking regular meals.  She will continue on her home medications and need to have a close follow-up by her provider for further recommendations.

## 2022-12-08 NOTE — Progress Notes (Addendum)
Patient requested for Nicotine patch and Resp treatment, SPO2 100% at room air, MD made aware of, nicotine patch ordered and given,

## 2022-12-08 NOTE — Assessment & Plan Note (Signed)
Most likely secondary to hypoglycemia.  Improved. CT head negative for any acute abnormality. -Continue to monitor

## 2022-12-08 NOTE — Consult Note (Addendum)
[facility] Face-to-Face Psychiatry Consult   Reason for Consult:  depression Referring Physician:  Dr Nelson Chimes Patient Identification: Abigail [name] [place] MRN:  [id number] Principal Diagnosis: Hypoglycemia Diagnosis:  Principal Problem:   Hypoglycemia Active Problems:   Depression   Gastroesophageal reflux disease   Chronic prescription opiate use   RA (rheumatoid arthritis)   Narcotic overdose   Acute metabolic encephalopathy   Essential hypertension   Hypothyroidism   Hypokalemia   Total Time spent with patient: 45 minutes  Subjective:   Abigail [name] is a 57 y.o. female patient admitted with hypoglycemia.  When asked about depression, she stated, "The same" as always.  HPI:  57 yo female who presented to the ED with hypoglycemia issues.  On assessment, she reported depression fluctuating between 5-10 with 10 being the highest related to her physical pain.  No suicidal ideations, denies past suicide attempts and psychiatric hospitalizations (consistent with chart review).  "Some" anxiety, no panic attacks.  She denies hallucinations and substance abuse.  She takes opiates five times a day along with gabapentin for chronic pain along with Cymbalta and Wellbutrin for depression.  Recommend her seeing a psychiatrist and therapist in outpatient, [facility] resources placed in discharge instructions.  Past Psychiatric History: depression, anxiety  Risk to Self:  none Risk to Others:  none Prior Inpatient Therapy:  none Prior Outpatient Therapy:  none  Past Medical History:  Past Medical History:  Diagnosis Date   Anxiety    Arthritis    Back pain    Basal cell carcinoma 10/04/2021   R axilla - needs excised 11/28/21   Basal cell carcinoma 10/04/2021   L antecubital excised 11/14/21   Diarrhea 11/12/2016   Fibromyalgia    Generalized abdominal pain 11/12/2016   H. pylori infection    Hyperlipidemia    IBS (irritable bowel syndrome)    Infectious colitis 04/29/2016    Migraines    Moderate dehydration 04/29/2016   Muscle pain    Reflux    Unexplained weight loss 11/12/2016    Past Surgical History:  Procedure Laterality Date   ABDOMINAL HYSTERECTOMY     APPENDECTOMY  2009   C5 FUSION     C6 FUSION     C7 FUSION     COLONOSCOPY  02/2006   COLONOSCOPY WITH PROPOFOL N/A 12/25/2016   Procedure: COLONOSCOPY WITH PROPOFOL;  Surgeon: Earline Mayotte, MD;  Location: [facility] ENDOSCOPY;  Service: Endoscopy;  Laterality: N/A;   ESOPHAGOGASTRODUODENOSCOPY (EGD) WITH PROPOFOL N/A 12/25/2016   Procedure: ESOPHAGOGASTRODUODENOSCOPY (EGD) WITH PROPOFOL;  Surgeon: Earline Mayotte, MD;  Location: [facility] ENDOSCOPY;  Service: Endoscopy;  Laterality: N/A;   FOOT SURGERY     HIP ARTHROPLASTY     L4 FUSION     L5 FUSION     S1 FUSION     Family History: History reviewed. No pertinent family history. Family Psychiatric  History: none Social History:  Social History   Substance and Sexual Activity  Alcohol Use Yes   Comment: OCCASIONALLY     Social History   Substance and Sexual Activity  Drug Use No    Social History   Socioeconomic History   Marital status: Divorced    Spouse name: Not on file   Number of children: Not on file   Years of education: Not on file   Highest education level: Not on file  Occupational History   Not on file  Tobacco Use   Smoking status: Every Day    Packs/day: 1  Types: Cigarettes   Smokeless tobacco: Never   Tobacco comments:    ELECTRONIC VAPOR  Substance and Sexual Activity   Alcohol use: Yes    Comment: OCCASIONALLY   Drug use: No   Sexual activity: Not on file  Other Topics Concern   Not on file  Social History Narrative   Not on file   Social Determinants of Health   Financial Resource Strain: Not on file  Food Insecurity: Not on file  Transportation Needs: Not on file  Physical Activity: Not on file  Stress: Not on file  Social Connections: Not on file   Additional Social History:     Allergies:   Allergies  Allergen Reactions   Nsaids Hives   Tapentadol Other (See Comments), Rash and Swelling    Made her deathly sick   Codeine    Darvocet [Propoxyphene N-Acetaminophen]    Sulfa Antibiotics    Tape     Labs:  Results for orders placed or performed during the hospital encounter of 12/07/22 (from the past 48 hour(s))  CBG monitoring, ED     Status: Abnormal   Collection Time: 12/07/22  8:47 PM  Result Value Ref Range   Glucose-Capillary 39 (LL) 70 - 99 mg/dL    Comment: Glucose reference range applies only to samples taken after fasting for at least 8 hours.   Comment 1 Repeat Test   Comprehensive metabolic panel     Status: Abnormal   Collection Time: 12/07/22  8:51 PM  Result Value Ref Range   Sodium 131 (L) 135 - 145 mmol/L   Potassium 2.3 (LL) 3.5 - 5.1 mmol/L    Comment: CRITICAL RESULT CALLED TO, READ BACK BY AND VERIFIED WITH RICHARD EDWARDS 12/07/2022 AT 2135 SRR    Chloride 97 (L) 98 - 111 mmol/L   CO2 26 22 - 32 mmol/L   Glucose, Bld 39 (LL) 70 - 99 mg/dL    Comment: CRITICAL RESULT CALLED TO, READ BACK BY AND VERIFIED WITH RICHARD EDWARDS 12/07/2022 AT 2135 SRR Glucose reference range applies only to samples taken after fasting for at least 8 hours.    BUN 5 (L) 6 - 20 mg/dL   Creatinine, Ser 1.61 0.44 - 1.00 mg/dL   Calcium 7.6 (L) 8.9 - 10.3 mg/dL   Total Protein 5.3 (L) 6.5 - 8.1 g/dL   Albumin 1.9 (L) 3.5 - 5.0 g/dL   AST 31 15 - 41 U/L   ALT 13 0 - 44 U/L   Alkaline Phosphatase 119 38 - 126 U/L   Total Bilirubin 0.7 0.3 - 1.2 mg/dL   GFR, Estimated >09 >60 mL/min    Comment: (NOTE) Calculated using the CKD-EPI Creatinine Equation (2021)    Anion gap 8 5 - 15    Comment: Performed at [facility], [address] [place], [place] [address]  Lactic acid, plasma     Status: None   Collection Time: 12/07/22  8:51 PM  Result Value Ref Range   Lactic Acid, Venous 1.9 0.5 - 1.9 mmol/L    Comment: Performed at [facility], [address] [place], [place] [address]  Lipase, blood     Status: None   Collection Time: 12/07/22  8:51 PM  Result Value Ref Range   Lipase 36 11 - 51 U/L    Comment: Performed at [facility], [address], [place], [place] [address]  CBC with Differential     Status: Abnormal   Collection Time: 12/07/22  8:51 PM  Result Value Ref Range   WBC 12.3 (H) 4.0 - 10.5 K/uL   RBC 4.26 3.87 - 5.11 MIL/uL   Hemoglobin 12.8 12.0 - 15.0 g/dL   HCT 16.1 09.6 - 04.5 %   MCV 89.4 80.0 - 100.0 fL   MCH 30.0 26.0 - 34.0 pg   MCHC 33.6 30.0 - 36.0 g/dL   RDW 40.9 81.1 - 91.4 %   Platelets 253 150 - 400 K/uL   nRBC 0.0 0.0 - 0.2 %   Neutrophils Relative % 65 %   Neutro Abs 8.0 (H) 1.7 - 7.7 K/uL   Lymphocytes Relative 21 %   Lymphs Abs 2.6 0.7 - 4.0 K/uL   Monocytes Relative 7 %   Monocytes Absolute 0.9 0.1 - 1.0 K/uL   Eosinophils Relative 6 %   Eosinophils Absolute 0.7 (H) 0.0 - 0.5 K/uL   Basophils Relative 0 %   Basophils Absolute 0.0 0.0 - 0.1 K/uL   WBC Morphology VACUOLATED NEUTROPHILS    Smear Review Normal platelet morphology    Immature Granulocytes 1 %   Abs Immature Granulocytes 0.06 0.00 - 0.07 K/uL   Polychromasia PRESENT     Comment: Performed at [facility], [address] [place], [place] [address]  Magnesium     Status: None   Collection Time: 12/07/22  8:51 PM  Result Value Ref Range   Magnesium 1.7 1.7 - 2.4 mg/dL    Comment: Performed at [facility], [address] [place], [place] [address]  Phosphorus     Status: None   Collection Time: 12/07/22  8:51 PM  Result Value Ref Range   Phosphorus 3.3 2.5 - 4.6 mg/dL    Comment: Performed at [facility], [address] [place], [place] [address]  Blood gas, venous     Status: Abnormal   Collection Time: 12/07/22  8:51 PM  Result Value Ref Range   pH, Ven 7.59 (H) 7.25 - 7.43   pCO2, Ven 29 (L) 44 - 60 mmHg   pO2, Ven 55 (H) 32 - 45 mmHg   Bicarbonate  27.8 20.0 - 28.0 mmol/L   Acid-Base Excess 6.6 (H) 0.0 - 2.0 mmol/L   O2 Saturation 91.8 %   Patient temperature 37.0    Collection site VEIN     Comment: Performed at [facility], [address], [place], [place] [address]  Troponin I (High Sensitivity)     Status: None   Collection Time: 12/07/22  8:51 PM  Result Value Ref Range   Troponin I (High Sensitivity) 6 <18 ng/L    Comment: (NOTE) Elevated high sensitivity troponin I (hsTnI) values and significant  changes across serial measurements may suggest ACS but many other  chronic and acute conditions are known to elevate hsTnI results.  Refer to the "Links" section for chest pain algorithms and additional  guidance. Performed at [facility], [address] [place], [place] [address]   CBG monitoring, ED     Status: Abnormal   Collection Time: 12/07/22  9:35 PM  Result Value Ref Range   Glucose-Capillary 211 (H) 70 - 99 mg/dL    Comment: Glucose reference range applies only to samples taken after fasting for at least 8 hours.  Urinalysis, w/ Reflex to Culture (Infection Suspected) -Urine, Clean Catch     Status: Abnormal   Collection Time: 12/07/22 10:46 PM  Result Value Ref Range   Specimen Source URINE, CLEAN CATCH    Color,  Urine YELLOW (A) YELLOW   APPearance CLEAR (A) CLEAR   Specific Gravity, Urine 1.003 (L) 1.005 - 1.030   pH 6.0 5.0 - 8.0   Glucose, UA 50 (A) NEGATIVE mg/dL   Hgb urine dipstick NEGATIVE NEGATIVE   Bilirubin Urine NEGATIVE NEGATIVE   Ketones, ur NEGATIVE NEGATIVE mg/dL   Protein, ur NEGATIVE NEGATIVE mg/dL   Nitrite NEGATIVE NEGATIVE   Leukocytes,Ua NEGATIVE NEGATIVE   RBC / HPF 0-5 0 - 5 RBC/hpf   WBC, UA 0-5 0 - 5 WBC/hpf    Comment:        Reflex urine culture not performed if WBC <=10, OR if Squamous epithelial cells >5. If Squamous epithelial cells >5 suggest recollection.    Bacteria, UA RARE (A) NONE SEEN   Squamous Epithelial / HPF 0-5 0 - 5 /HPF    Comment:  Performed at [facility], [address] [place], [place] [address]  Urine Drug Screen, Qualitative     Status: Abnormal   Collection Time: 12/07/22 10:46 PM  Result Value Ref Range   Tricyclic, Ur Screen POSITIVE (A) NONE DETECTED   Amphetamines, Ur Screen NONE DETECTED NONE DETECTED   MDMA (Ecstasy)Ur Screen NONE DETECTED NONE DETECTED   Cocaine Metabolite,Ur [place] NONE DETECTED NONE DETECTED   Opiate, Ur Screen NONE DETECTED NONE DETECTED   Phencyclidine (PCP) Ur S NONE DETECTED NONE DETECTED   Cannabinoid 50 Ng, Ur [place] NONE DETECTED NONE DETECTED   Barbiturates, Ur Screen NONE DETECTED NONE DETECTED   Benzodiazepine, Ur Scrn NONE DETECTED NONE DETECTED   Methadone Scn, Ur NONE DETECTED NONE DETECTED    Comment: (NOTE) Tricyclics + metabolites, urine    Cutoff 1000 ng/mL Amphetamines + metabolites, urine  Cutoff 1000 ng/mL MDMA (Ecstasy), urine              Cutoff 500 ng/mL Cocaine Metabolite, urine          Cutoff 300 ng/mL Opiate + metabolites, urine        Cutoff 300 ng/mL Phencyclidine (PCP), urine         Cutoff 25 ng/mL Cannabinoid, urine                 Cutoff 50 ng/mL Barbiturates + metabolites, urine  Cutoff 200 ng/mL Benzodiazepine, urine              Cutoff 200 ng/mL Methadone, urine                   Cutoff 300 ng/mL  The urine drug screen provides only a preliminary, unconfirmed analytical test result and should not be used for non-medical purposes. Clinical consideration and professional judgment should be applied to any positive drug screen result due to possible interfering substances. A more specific alternate chemical method must be used in order to obtain a confirmed analytical result. Gas chromatography / mass spectrometry (GC/MS) is the preferred confirm atory method. Performed at [facility], [address] [place], [place] [address]   Troponin I (High Sensitivity)     Status: None   Collection Time: 12/07/22 10:46 PM  Result Value  Ref Range   Troponin I (High Sensitivity) 5 <18 ng/L    Comment: (NOTE) Elevated high sensitivity troponin I (hsTnI) values and significant  changes across serial measurements may suggest ACS but many other  chronic and acute conditions are known to elevate hsTnI results.  Refer to the "Links" section for chest pain algorithms and additional  guidance. Performed at [facility], [address] [place], [place] [address]   POC CBG, ED     Status: Abnormal   Collection Time: 12/07/22 11:27 PM  Result Value Ref Range   Glucose-Capillary 44 (LL) 70 - 99 mg/dL    Comment: Glucose reference range applies only to samples taken after fasting for at least 8 hours.   Comment 1 Document in Chart   POC CBG, ED     Status: Abnormal   Collection Time: 12/08/22 12:32 AM  Result Value Ref Range   Glucose-Capillary 58 (L) 70 - 99 mg/dL    Comment: Glucose reference range applies only to samples taken after fasting for at least 8 hours.  POC CBG, ED     Status: Abnormal   Collection Time: 12/08/22  1:15 AM  Result Value Ref Range   Glucose-Capillary 68 (L) 70 - 99 mg/dL    Comment: Glucose reference range applies only to samples taken after fasting for at least 8 hours.  POC CBG, ED     Status: Abnormal   Collection Time: 12/08/22  2:02 AM  Result Value Ref Range   Glucose-Capillary 46 (L) 70 - 99 mg/dL    Comment: Glucose reference range applies only to samples taken after fasting for at least 8 hours.  POC CBG, ED     Status: Abnormal   Collection Time: 12/08/22  3:28 AM  Result Value Ref Range   Glucose-Capillary 100 (H) 70 - 99 mg/dL    Comment: Glucose reference range applies only to samples taken after fasting for at least 8 hours.  POC CBG, ED     Status: Abnormal   Collection Time: 12/08/22  4:59 AM  Result Value Ref Range   Glucose-Capillary 46 (L) 70 - 99 mg/dL    Comment: Glucose reference range applies only to samples taken after fasting for at least 8 hours.  HIV  Antibody (routine testing w rflx)     Status: None   Collection Time: 12/08/22  4:59 AM  Result Value Ref Range   HIV Screen 4th Generation wRfx Non Reactive Non Reactive    Comment: Performed at [facility], [address] [place], [place] [address]  CBC     Status: None   Collection Time: 12/08/22  4:59 AM  Result Value Ref Range   WBC 8.3 4.0 - 10.5 K/uL   RBC 4.43 3.87 - 5.11 MIL/uL   Hemoglobin 13.1 12.0 - 15.0 g/dL   HCT 21.3 08.6 - 57.8 %   MCV 93.0 80.0 - 100.0 fL   MCH 29.6 26.0 - 34.0 pg   MCHC 31.8 30.0 - 36.0 g/dL   RDW 46.9 62.9 - 52.8 %   Platelets 192 150 - 400 K/uL   nRBC 0.0 0.0 - 0.2 %    Comment: Performed at [facility], [address] [place], [place] [address]  Comprehensive metabolic panel     Status: Abnormal   Collection Time: 12/08/22  4:59 AM  Result Value Ref Range   Sodium 140 135 - 145 mmol/L    Comment: RESULTS VERIFIED BY REPEAT TESTING DLB   Potassium 2.3 (LL) 3.5 - 5.1 mmol/L    Comment: CRITICAL RESULT CALLED TO, READ BACK BY AND VERIFIED WITH RICHARD EDWARDS AT 0554 12/08/2022 DLB    Chloride 105 98 - 111 mmol/L   CO2 28 22 - 32 mmol/L   Glucose, Bld 49 (L) 70 - 99 mg/dL    Comment: Glucose reference range applies only to  samples taken after fasting for at least 8 hours.   BUN <5 (L) 6 - 20 mg/dL   Creatinine, Ser 0.16 0.44 - 1.00 mg/dL   Calcium 7.4 (L) 8.9 - 10.3 mg/dL   Total Protein 5.2 (L) 6.5 - 8.1 g/dL   Albumin 1.9 (L) 3.5 - 5.0 g/dL   AST 25 15 - 41 U/L   ALT 14 0 - 44 U/L   Alkaline Phosphatase 119 38 - 126 U/L   Total Bilirubin 0.5 0.3 - 1.2 mg/dL   GFR, Estimated >01 >09 mL/min    Comment: (NOTE) Calculated using the CKD-EPI Creatinine Equation (2021)    Anion gap 7 5 - 15    Comment: Performed at [facility], [address] [place], [place] [address]  Magnesium     Status: None   Collection Time: 12/08/22  4:59 AM  Result Value Ref Range   Magnesium 2.0 1.7 - 2.4 mg/dL    Comment:  Performed at [facility], [address] [place], [place] [address]  POC CBG, ED     Status: None   Collection Time: 12/08/22  6:32 AM  Result Value Ref Range   Glucose-Capillary 72 70 - 99 mg/dL    Comment: Glucose reference range applies only to samples taken after fasting for at least 8 hours.  POC CBG, ED     Status: Abnormal   Collection Time: 12/08/22  7:09 AM  Result Value Ref Range   Glucose-Capillary 49 (L) 70 - 99 mg/dL    Comment: Glucose reference range applies only to samples taken after fasting for at least 8 hours.  POC CBG, ED     Status: Abnormal   Collection Time: 12/08/22  7:51 AM  Result Value Ref Range   Glucose-Capillary 100 (H) 70 - 99 mg/dL    Comment: Glucose reference range applies only to samples taken after fasting for at least 8 hours.  POC CBG, ED     Status: Abnormal   Collection Time: 12/08/22  9:05 AM  Result Value Ref Range   Glucose-Capillary 68 (L) 70 - 99 mg/dL    Comment: Glucose reference range applies only to samples taken after fasting for at least 8 hours.  POC CBG, ED     Status: Abnormal   Collection Time: 12/08/22 10:01 AM  Result Value Ref Range   Glucose-Capillary 67 (L) 70 - 99 mg/dL    Comment: Glucose reference range applies only to samples taken after fasting for at least 8 hours.  POC CBG, ED     Status: None   Collection Time: 12/08/22 12:22 PM  Result Value Ref Range   Glucose-Capillary 71 70 - 99 mg/dL    Comment: Glucose reference range applies only to samples taken after fasting for at least 8 hours.  POC CBG, ED     Status: None   Collection Time: 12/08/22  2:49 PM  Result Value Ref Range   Glucose-Capillary 76 70 - 99 mg/dL    Comment: Glucose reference range applies only to samples taken after fasting for at least 8 hours.    Current Facility-Administered Medications  Medication Dose Route Frequency Provider Last Rate Last Admin   acetaminophen (TYLENOL) tablet 650 mg  650 mg Oral Q6H PRN Rometta Emery, MD       Or   acetaminophen (TYLENOL) suppository 650 mg  650 mg Rectal Q6H PRN Rometta Emery, MD       buPROPion (WELLBUTRIN XL) 24 hr  tablet 150 mg  150 mg Oral Daily Arnetha Courser, MD       cholecalciferol (VITAMIN D3) 25 MCG (1000 UNIT) tablet 500 Units  500 Units Oral Daily Arnetha Courser, MD       dextrose 10 % and 0.45 % NaCl infusion   Intravenous Continuous Sharman Cheek, MD   Stopped at 12/08/22 1338   DULoxetine (CYMBALTA) DR capsule 60 mg  60 mg Oral BID Arnetha Courser, MD       enoxaparin (LOVENOX) injection 40 mg  40 mg Subcutaneous Q24H Earlie Lou L, MD   40 mg at 12/08/22 0957   gabapentin (NEURONTIN) capsule 300-600 mg  300-600 mg Oral TID Arnetha Courser, MD       lidocaine (LIDODERM) 5 % 1 patch  1 patch Transdermal Q24H Arnetha Courser, MD       ondansetron (ZOFRAN) tablet 4 mg  4 mg Oral Q6H PRN Rometta Emery, MD       Or   ondansetron (ZOFRAN) injection 4 mg  4 mg Intravenous Q6H PRN Rometta Emery, MD       oxyCODONE (Oxy IR/ROXICODONE) immediate release tablet 20 mg  20 mg Oral 5 X Daily PRN Coulter, Eber Jones, RPH   20 mg at 12/08/22 1219   pantoprazole (PROTONIX) EC tablet 40 mg  40 mg Oral Daily Arnetha Courser, MD       polyethylene glycol (MIRALAX / GLYCOLAX) packet 17 g  17 g Oral BID PRN Arnetha Courser, MD       thiamine (VITAMIN B1) tablet 100 mg  100 mg Oral Daily Arnetha Courser, MD       Current Outpatient Medications  Medication Sig Dispense Refill   albuterol (VENTOLIN HFA) 108 (90 Base) MCG/ACT inhaler Inhale 1-2 puffs into the lungs every 6 (six) hours as needed for wheezing or shortness of breath.     buPROPion (WELLBUTRIN XL) 150 MG 24 hr tablet Take 150 mg by mouth daily.     Cholecalciferol (VITAMIN D3) 10 MCG (400 UNIT) tablet Take 400 Units by mouth daily.     cyclobenzaprine (FLEXERIL) 10 MG tablet Take 10 mg by mouth 3 (three) times daily.     DULoxetine (CYMBALTA) 60 MG capsule Take 60 mg by mouth 2 (two) times daily.     gabapentin  (NEURONTIN) 300 MG capsule Take 300-600 mg by mouth 3 (three) times daily.     hydrochlorothiazide (HYDRODIURIL) 12.5 MG tablet Take 12.5 mg by mouth daily.     mupirocin ointment (BACTROBAN) 2 % Apply 1 application  topically daily. Apply to scratches and bump at nose until healed. 22 g 0   Oxycodone HCl 20 MG TABS Take 0.5 tablets by mouth every 6 (six) hours as needed.     pantoprazole (PROTONIX) 40 MG tablet Take 40 mg by mouth daily.     polyethylene glycol powder (GLYCOLAX/MIRALAX) 17 GM/SCOOP powder Take 17 g by mouth 2 (two) times daily as needed. 3350 g 1   potassium chloride (KLOR-CON) 10 MEQ tablet Take 10 mEq by mouth daily.     promethazine (PHENERGAN) 25 MG tablet Take 1 tablet (25 mg total) by mouth every 8 (eight) hours as needed for nausea or vomiting. 20 tablet 0   thiamine (VITAMIN B1) 100 MG tablet Take 1 tablet by mouth daily.     atorvastatin (LIPITOR) 40 MG tablet  (Patient not taking: Reported on 12/08/2022)     folic acid (FOLVITE) 1 MG tablet Take 1 mg by mouth daily. (Patient  not taking: Reported on 12/08/2022)     levothyroxine (SYNTHROID, LEVOTHROID) 75 MCG tablet Take 75 mcg by mouth daily before breakfast. (Patient not taking: Reported on 12/08/2022)     meclizine (ANTIVERT) 25 MG tablet Take 1 tablet (25 mg total) by mouth 3 (three) times daily as needed for dizziness. (Patient not taking: Reported on 12/08/2022) 90 tablet 1   methotrexate (RHEUMATREX) 2.5 MG tablet Take 7.5 mg by mouth 3 (three) times a week. (Patient not taking: Reported on 12/08/2022)     morphine (MS CONTIN) 15 MG 12 hr tablet Take 15 mg by mouth 4 (four) times daily as needed.  (Patient not taking: Reported on 12/08/2022)     mupirocin ointment (BACTROBAN) 2 % Apply 1 application. topically daily. With dressing changes 22 g 0    Musculoskeletal: Strength & Muscle Tone: decreased Gait & Station:  did not witness Patient leans: N/A  Psychiatric Specialty Exam: Physical Exam Vitals and nursing  note reviewed.  Constitutional:      Appearance: She is well-developed.  HENT:     Head: Normocephalic.  Pulmonary:     Effort: Pulmonary effort is normal.  Musculoskeletal:        General: Normal range of motion.     Cervical back: Normal range of motion.  Neurological:     General: No focal deficit present.     Mental Status: She is alert and oriented to person, place, and time.  Psychiatric:        Attention and Perception: Attention and perception normal.        Mood and Affect: Mood is anxious and depressed.        Speech: Speech normal.        Behavior: Behavior normal. Behavior is cooperative.        Thought Content: Thought content normal.        Cognition and Memory: Cognition and memory normal.        Judgment: Judgment normal.     Review of Systems  Psychiatric/Behavioral:  Positive for depression. The patient is nervous/anxious.   All other systems reviewed and are negative.   Blood pressure (!) 107/92, pulse 94, temperature 98.2 F (36.8 C), temperature source Oral, resp. rate 13, height 5\' 9"  (1.753 m), weight 89.2 kg, SpO2 99 %.Body mass index is 29.03 kg/m.  General Appearance: Casual  Eye Contact:  Fair  Speech:  Normal Rate  Volume:  Normal  Mood:  Anxious and Depressed  Affect:  Congruent  Thought Process:  Coherent  Orientation:  Full (Time, Place, and Person)  Thought Content:  WDL and Logical  Suicidal Thoughts:  No  Homicidal Thoughts:  No  Memory:  Immediate;   Good Recent;   Good Remote;   Good  Judgement:  Good  Insight:  Fair  Psychomotor Activity:  Decreased  Concentration:  Concentration: Fair and Attention Span: Fair  Recall:  Good  Fund of Knowledge:  Good  Language:  Good  Akathisia:  No  Handed:  Right  AIMS (if indicated):     Assets:  Housing Leisure Time Resilience  ADL's:  Intact  Cognition:  WNL  Sleep:        Physical Exam: Physical Exam Vitals and nursing note reviewed.  Constitutional:      Appearance: She is  well-developed.  HENT:     Head: Normocephalic.  Pulmonary:     Effort: Pulmonary effort is normal.  Musculoskeletal:        General: Normal range of  motion.     Cervical back: Normal range of motion.  Neurological:     General: No focal deficit present.     Mental Status: She is alert and oriented to person, place, and time.  Psychiatric:        Attention and Perception: Attention and perception normal.        Mood and Affect: Mood is anxious and depressed.        Speech: Speech normal.        Behavior: Behavior normal. Behavior is cooperative.        Thought Content: Thought content normal.        Cognition and Memory: Cognition and memory normal.        Judgment: Judgment normal.    Review of Systems  Psychiatric/Behavioral:  Positive for depression. The patient is nervous/anxious.   All other systems reviewed and are negative.  Blood pressure (!) 107/92, pulse 94, temperature 98.2 F (36.8 C), temperature source Oral, resp. rate 13, height  (1.753 m), weight 89.2 kg, SpO2 99 %. Body mass index is 29.03 kg/m.  Treatment Plan Summary: Major depressive disorder, moderate: Wellbutrin 150 mg daily Cymbalta 60 mg BID Psychiatrist and therapist after discharge, resources in discharge instructions  Disposition: No evidence of imminent risk to self or others at present.   Patient does not meet criteria for psychiatric inpatient admission. Supportive therapy provided about ongoing stressors.  Nanine Means, NP 12/08/2022 3:20 PM

## 2022-12-08 NOTE — Assessment & Plan Note (Signed)
Patient with history of chronic lower back pain for which she uses pretty high-dose oxycodone.  Per patient she takes her pain medications regularly, ironically UDS was negative for opioids and only shows tricyclic. -Continue home meds

## 2022-12-08 NOTE — H&P (Signed)
History and Physical    Patient: Abigail [name] [facility] ZOX:[id number] DOB: [birth year] DOA: 12/07/2022 DOS: the patient was seen and examined on 12/08/2022 PCP: Alease Medina, MD  Patient coming from: Home  Chief Complaint:  Chief Complaint  Patient presents with   Hypoglycemia   Shortness of Breath   HPI: Abigail [name] is a 57 y.o. female with medical history significant of GERD, rheumatoid arthritis, chronic narcotic abuse, fibromyalgia, chronic pain syndrome, essential hypertension, hypothyroidism, degenerative disc disease, who was brought in with altered mental status.  Patient suspected to have had inadvertent drug overdose.  She takes high-dose MS Contin and oxycodone.  On arrival she was found to have blood sugar of 39.  She has shortness of breath.  Patient has received doses of glucose but no significant improvement.  Blood sugar has dropped despite D10.  At this point started on D10 drip and patient maintained on the drip so being admitted for further evaluation.  She is confused not able to give adequate history.  Review of Systems: As mentioned in the history of present illness. All other systems reviewed and are negative. Past Medical History:  Diagnosis Date   Anxiety    Arthritis    Back pain    Basal cell carcinoma 10/04/2021   R axilla - needs excised 11/28/21   Basal cell carcinoma 10/04/2021   L antecubital excised 11/14/21   Diarrhea 11/12/2016   Fibromyalgia    Generalized abdominal pain 11/12/2016   H. pylori infection    Hyperlipidemia    IBS (irritable bowel syndrome)    Infectious colitis 04/29/2016   Migraines    Moderate dehydration 04/29/2016   Muscle pain    Reflux    Unexplained weight loss 11/12/2016   Past Surgical History:  Procedure Laterality Date   ABDOMINAL HYSTERECTOMY     APPENDECTOMY  2009   C5 FUSION     C6 FUSION     C7 FUSION     COLONOSCOPY  02/2006   COLONOSCOPY WITH PROPOFOL N/A 12/25/2016   Procedure:  COLONOSCOPY WITH PROPOFOL;  Surgeon: Earline Mayotte, MD;  Location: [facility] ENDOSCOPY;  Service: Endoscopy;  Laterality: N/A;   ESOPHAGOGASTRODUODENOSCOPY (EGD) WITH PROPOFOL N/A 12/25/2016   Procedure: ESOPHAGOGASTRODUODENOSCOPY (EGD) WITH PROPOFOL;  Surgeon: Earline Mayotte, MD;  Location: [facility] ENDOSCOPY;  Service: Endoscopy;  Laterality: N/A;   FOOT SURGERY     HIP ARTHROPLASTY     L4 FUSION     L5 FUSION     S1 FUSION     Social History:  reports that she has been smoking cigarettes. She has been smoking an average of 1 pack per day. She has never used smokeless tobacco. She reports current alcohol use. She reports that she does not use drugs.  Allergies  Allergen Reactions   Codeine    Darvocet [Propoxyphene N-Acetaminophen]    Sulfa Antibiotics    Tape     History reviewed. No pertinent family history.  Prior to Admission medications   Medication Sig Start Date End Date Taking? Authorizing Provider  Oxycodone HCl 20 MG TABS Take 0.5 tablets by mouth every 6 (six) hours as needed. 11/23/22  Yes [provider]  potassium chloride (KLOR-CON) 10 MEQ tablet Take 10 mEq by mouth daily. 11/15/22  Yes [provider]  thiamine (VITAMIN B1) 100 MG tablet Take 1 tablet by mouth daily. 11/10/22  Yes [provider]  tiZANidine (ZANAFLEX) 4 MG tablet Take 4 mg by mouth every 6 (  six) hours as needed. 10/02/22  Yes [provider]  albuterol (VENTOLIN HFA) 108 (90 Base) MCG/ACT inhaler  10/15/19   [provider]  atorvastatin (LIPITOR) 40 MG tablet  09/03/19   [provider]  Biotin 5000 MCG TABS Take by mouth.    [provider]  Cholecalciferol (VITAMIN D3) 10000 units TABS Take 10,000 Units/day by mouth daily.    [provider]  cyclobenzaprine (FLEXERIL) 10 MG tablet  10/15/19   [provider]  DULoxetine (CYMBALTA) 60 MG capsule Take 60 mg by mouth 2 (two) times daily.    [provider]  estradiol  (ESTRACE) 2 MG tablet Take 2 mg by mouth daily.    [provider]  folic acid (FOLVITE) 1 MG tablet Take 1 mg by mouth daily.    [provider]  gabapentin (NEURONTIN) 300 MG capsule  09/17/19   [provider]  hydrochlorothiazide (HYDRODIURIL) 12.5 MG tablet  09/11/19   [provider]  levothyroxine (SYNTHROID, LEVOTHROID) 75 MCG tablet Take 75 mcg by mouth daily before breakfast.    [provider]  meclizine (ANTIVERT) 25 MG tablet Take 1 tablet (25 mg total) by mouth 3 (three) times daily as needed for dizziness. 04/26/20   Malfi, Jodelle Gross, FNP  methotrexate (RHEUMATREX) 2.5 MG tablet Take 7.5 mg by mouth 3 (three) times a week.    [provider]  morphine (MS CONTIN) 15 MG 12 hr tablet Take 15 mg by mouth 4 (four) times daily as needed.     [provider]  mupirocin ointment (BACTROBAN) 2 % Apply 1 application. topically daily. With dressing changes 11/14/21   Deirdre Evener, MD  mupirocin ointment (BACTROBAN) 2 % Apply 1 application  topically daily. Apply to scratches and bump at nose until healed. 01/29/22   Deirdre Evener, MD  pantoprazole (PROTONIX) 40 MG tablet Take 40 mg by mouth daily.    [provider]  polyethylene glycol powder (GLYCOLAX/MIRALAX) 17 GM/SCOOP powder Take 17 g by mouth 2 (two) times daily as needed. 04/26/20   Malfi, Jodelle Gross, FNP  predniSONE (DELTASONE) 10 MG tablet Take 40 mg by mouth daily.    [provider]  promethazine (PHENERGAN) 25 MG tablet Take 1 tablet (25 mg total) by mouth every 8 (eight) hours as needed for nausea or vomiting. 04/26/20   Malfi, Jodelle Gross, FNP  ranitidine (ZANTAC) 300 MG capsule Take 300 mg by mouth every evening.    [provider]    Physical Exam: Vitals:   12/07/22 2049 12/07/22 2200 12/07/22 2300 12/07/22 2320  BP:  102/64 101/74 94/65  Pulse:  88 74 68  Resp:  (!) 30 20 (!) 22  Temp:      TempSrc:      SpO2:  91% 96% 98%  Weight:  89.2 kg     Height:  (1.753 m)      Constitutional: Confused with altered mental status: NAD, calm, comfortable Eyes: PERRL, lids and conjunctivae normal ENMT: Mucous membranes are moist. Posterior pharynx clear of any exudate or lesions.Normal dentition.  Neck: normal, supple, no masses, no thyromegaly Respiratory: clear to auscultation bilaterally, no wheezing, no crackles. Normal respiratory effort. No accessory muscle use.  Cardiovascular: Regular rate and rhythm, no murmurs / rubs / gallops. No extremity edema. 2+ pedal pulses. No carotid bruits.  Abdomen: no tenderness, no masses palpated. No hepatosplenomegaly. Bowel sounds positive.  Musculoskeletal: Good range of motion, no joint swelling or tenderness,  Skin: no rashes, lesions, ulcers. No induration Neurologic: CN 2-12 grossly intact. Sensation intact, DTR normal. Strength 5/5 in all 4.  Psychiatric: Confused  Data Reviewed:  White count 12.3, potassium 2.3, glucose 39, chloride 97, pH 7.59, albumin is 1.9, sodium 131 urine drug screen is positive for tricyclic chest x-ray showed no active disease, head CT without contrast showed no acute intracranial abnormalities  Assessment and Plan:  #1 hypoglycemia: Cause unclear.  Most likely poor oral intake.  Patient not a diabetic and not on insulin.  At this point we will admit the patient to progressive care.  Continue with D10 and frequent blood glucose checks.  The a.m. will be to continue until blood sugar stabilizes.  Oral intake to be maintained.  #2 altered mental status: Patient has acute metabolic encephalopathy most likely from hypoglycemia.  Mental status likely to improve with glucose infusion.  #3 chronic pain syndrome: Patient has received some Narcan.  Holding narcotics until she is more awake.  She is at high risk for withdrawals.  #4 rheumatoid arthritis: Patient takes methotrexate 3 times a week.  Will resume as soon as possible.  #5 GERD: Will continue PPIs as  soon as patient can eat or drink.  #6 essential hypertension: Continue to monitor and resume home regimen.  #7 hypothyroidism: Continue levothyroxine.  #8 severe hypokalemia: Continue to replete.  Magnesium and phosphorus is normal.    Advance Care Planning:   Code Status: Full Code   Consults: None at the moment  Family Communication: No family at bedside  Severity of Illness: The appropriate patient status for this patient is INPATIENT. Inpatient status is judged to be reasonable and necessary in order to provide the required intensity of service to ensure the patient's safety. The patient's presenting symptoms, physical exam findings, and initial radiographic and laboratory data in the context of their chronic comorbidities is felt to place them at high risk for further clinical deterioration. Furthermore, it is not anticipated that the patient will be medically stable for discharge from the hospital within 2 midnights of admission.   * I certify that at the point of admission it is my clinical judgment that the patient will require inpatient hospital care spanning beyond 2 midnights from the point of admission due to high intensity of service, high risk for further deterioration and high frequency of surveillance required.*  AuthorLonia Blood, MD 12/08/2022 12:03 AM  For on call review [email or web address].

## 2022-12-08 NOTE — Assessment & Plan Note (Signed)
Continue home Cymbalta and Wellbutrin

## 2022-12-09 DIAGNOSIS — E039 Hypothyroidism, unspecified: Secondary | ICD-10-CM

## 2022-12-09 DIAGNOSIS — G9341 Metabolic encephalopathy: Secondary | ICD-10-CM

## 2022-12-09 DIAGNOSIS — Z79891 Long term (current) use of opiate analgesic: Secondary | ICD-10-CM

## 2022-12-09 DIAGNOSIS — I10 Essential (primary) hypertension: Secondary | ICD-10-CM

## 2022-12-09 DIAGNOSIS — E162 Hypoglycemia, unspecified: Secondary | ICD-10-CM | POA: Diagnosis not present

## 2022-12-09 LAB — BASIC METABOLIC PANEL
Anion gap: 4 — ABNORMAL LOW (ref 5–15)
BUN: <5 mg/dL — ABNORMAL LOW (ref 6–20)
CO2: 26 mmol/L (ref 22–32)
Calcium: 7.3 mg/dL — ABNORMAL LOW (ref 8.9–10.3)
Chloride: 103 mmol/L (ref 98–111)
Creatinine, Ser: 0.54 mg/dL (ref 0.44–1.00)
GFR, Estimated: >60 mL/min (ref >60)
Glucose, Bld: 77 mg/dL (ref 70–99)
Potassium: 3.5 mmol/L (ref 3.5–5.1)
Sodium: 133 mmol/L — ABNORMAL LOW (ref 135–145)

## 2022-12-09 LAB — GLUCOSE, CAPILLARY
Glucose-Capillary: 106 mg/dL — ABNORMAL HIGH (ref 70–99)
Glucose-Capillary: 118 mg/dL — ABNORMAL HIGH (ref 70–99)
Glucose-Capillary: 128 mg/dL — ABNORMAL HIGH (ref 70–99)
Glucose-Capillary: 131 mg/dL — ABNORMAL HIGH (ref 70–99)
Glucose-Capillary: 140 mg/dL — ABNORMAL HIGH (ref 70–99)
Glucose-Capillary: 164 mg/dL — ABNORMAL HIGH (ref 70–99)
Glucose-Capillary: 85 mg/dL (ref 70–99)

## 2022-12-09 LAB — MAGNESIUM: Magnesium: 1.6 mg/dL — ABNORMAL LOW (ref 1.7–2.4)

## 2022-12-09 LAB — CORTISOL-AM, BLOOD: Cortisol - AM: 7.1 ug/dL (ref 6.7–22.6)

## 2022-12-09 MED ORDER — MAGNESIUM SULFATE 2 GM/50ML IV SOLN
2.0000 g | Freq: Once | INTRAVENOUS | Status: AC
  Administered 2022-12-09: 2 g via INTRAVENOUS
  Filled 2022-12-09: qty 50

## 2022-12-09 NOTE — Progress Notes (Signed)
Progress Note   Patient: Abigail [name] [facility] ZOX:[id number] DOB: July 11, 1966 DOA: 12/07/2022     1 DOS: the patient was seen and examined on 12/09/2022   Brief hospital course: Taken from H&P.  Reha [facility] is a 57 y.o. female with medical history significant of GERD, rheumatoid arthritis, chronic narcotic abuse, fibromyalgia, chronic pain syndrome, essential hypertension, hypothyroidism, degenerative disc disease, who was brought in with altered mental status.  Patient suspected to have had inadvertent drug overdose.  She takes high-dose MS Contin and oxycodone.    On arrival she was found to have blood sugar of 39.  She has shortness of breath.  Patient has received doses of glucose but no significant improvement.  Blood sugar has dropped despite D10.  At this point started on D10 drip and patient maintained on the drip so being admitted for further evaluation.   She was also found to have significant hypokalemia and mild hyponatremia.  CBC with mild leukocytosis, magnesium 1.7, UA negative for UTI, UDS positive for tricyclic, ironically negative for opioids.  4/20: Vital stable, CBG again dropped to 49 requiring a D50.  D10 rate increased to 200 mL/h.  Potassium remained at 2.3 which is being repleted.  Magnesium 2.  Sodium normalized at 140 now. Checking hypoglycemia labs. Patient feels depressed with poor p.o. intake for the past 1 week.  Having some diarrhea today, no nausea or vomiting.  Patient denied any suicidal thoughts but appears depressed.  Psych was also consulted for evaluation. She was complaining of right knee pain due to fall, imaging was obtained and it was negative for any acute abnormality.  Patient denies any other recent illness just saying that she was not feeling hungry for a while.  4/21: CBG stabilizes.  Hypoglycemia labs pending.  P.o. intake remained poor.   Assessment and Plan: * Hypoglycemia Unclear etiology.  Patient with poor p.o. intake for  the past week.  No history of diabetes.  Patient denies any illicit or inappropriate drug use.  She denies any suicidal thoughts or ideations. Stating that she was not feeling hungry for more than a week with very poor p.o. intake.  No recent illnesses. -Checking hypoglycemia labs which include insulin-C-peptide and sulfonylurea. -Monitor CBG -Stopping D10 as CBG has been stabilized -Encourage p.o. intake  Acute metabolic encephalopathy Most likely secondary to hypoglycemia.  Improved. CT head negative for any acute abnormality. -Continue to monitor  Chronic prescription opiate use Patient with history of chronic lower back pain for which she uses pretty high-dose oxycodone.  Per patient she takes her pain medications regularly, ironically UDS was negative for opioids and only shows tricyclic. -Continue home meds  RA (rheumatoid arthritis) Per patient she has stopped taking her methotrexate. -Outpatient follow-up with rheumatology  Essential hypertension Blood pressure within goal. -Holding home HCTZ as she appears dry  Hypothyroidism Patient stopped taking her Synthroid. -Check TSH -Will resume Synthroid after lab  Hypokalemia Persistent hypokalemia.  Magnesium improved to 2 -Replace potassium and monitor  Gastroesophageal reflux disease -Continue PPI  Depression Continue home Cymbalta and Wellbutrin   Subjective: Patient was feeling little somnolent when seen today.  Stating that she could not sleep well last night and wants to get some sleep.  Physical Exam: Vitals:   12/08/22 2354 12/09/22 0348 12/09/22 0816 12/09/22 1219  BP: 108/66 94/60 116/88 (!) 113/91  Pulse: (!) 105 88 97 97  Resp:  Temp:  98.4 F (36.9 C) 98.4 F (36.9 C) 98.4  F (36.9 C)  TempSrc:      SpO2:  96% 91% 95%  Weight:  93.5 kg    Height:       General.  Obese lady, in no acute distress. Pulmonary.  Lungs clear bilaterally, normal respiratory effort. CV.  Regular rate and  rhythm, no JVD, rub or murmur. Abdomen.  Soft, nontender, nondistended, BS positive. CNS.  Somnolent.  No focal neurologic deficit. Extremities.  No edema, no cyanosis, pulses intact and symmetrical. Psychiatry.  Judgment and insight appears normal.   Data Reviewed: Prior data reviewed  Family Communication: Discussed with patient  Disposition: Status is: Inpatient Remains inpatient appropriate because: Severity of illness  Planned Discharge Destination: Home  DVT prophylaxis.  Lovenox Time spent: 40 minutes  This record has been created using Conservation officer, historic buildings. Errors have been sought and corrected,but may not always be located. Such creation errors do not reflect on the standard of care.   Author: Arnetha Courser, MD 12/09/2022 3:17 PM  For on call review [email or web address].

## 2022-12-09 NOTE — Assessment & Plan Note (Signed)
Unclear etiology.  Patient with poor p.o. intake for the past week.  No history of diabetes.  Patient denies any illicit or inappropriate drug use.  She denies any suicidal thoughts or ideations. Stating that she was not feeling hungry for more than a week with very poor p.o. intake.  No recent illnesses. -Checking hypoglycemia labs which include insulin-C-peptide and sulfonylurea. -Monitor CBG -Stopping D10 as CBG has been stabilized -Encourage p.o. intake

## 2022-12-09 NOTE — Assessment & Plan Note (Signed)
Most likely secondary to hypoglycemia.  Improved. CT head negative for any acute abnormality. -Continue to monitor 

## 2022-12-10 DIAGNOSIS — T402X1A Poisoning by other opioids, accidental (unintentional), initial encounter: Secondary | ICD-10-CM | POA: Diagnosis not present

## 2022-12-10 DIAGNOSIS — G9341 Metabolic encephalopathy: Secondary | ICD-10-CM | POA: Diagnosis not present

## 2022-12-10 DIAGNOSIS — E876 Hypokalemia: Secondary | ICD-10-CM

## 2022-12-10 DIAGNOSIS — E162 Hypoglycemia, unspecified: Secondary | ICD-10-CM | POA: Diagnosis not present

## 2022-12-10 LAB — GLUCOSE, CAPILLARY
Glucose-Capillary: 80 mg/dL (ref 70–99)
Glucose-Capillary: 121 mg/dL — ABNORMAL HIGH (ref 70–99)
Glucose-Capillary: 119 mg/dL — ABNORMAL HIGH (ref 70–99)

## 2022-12-10 LAB — CBC
HCT: 39.4 % (ref 36.0–46.0)
Hemoglobin: 12.2 g/dL (ref 12.0–15.0)
MCH: 29.5 pg (ref 26.0–34.0)
MCHC: 31 g/dL (ref 30.0–36.0)
MCV: 95.4 fL (ref 80.0–100.0)
Platelets: 191 10*3/uL (ref 150–400)
RBC: 4.13 MIL/uL (ref 3.87–5.11)
RDW: 14.1 % (ref 11.5–15.5)
WBC: 8.8 10*3/uL (ref 4.0–10.5)
nRBC: 0 % (ref 0.0–0.2)

## 2022-12-10 LAB — BASIC METABOLIC PANEL
Anion gap: 7 (ref 5–15)
BUN: <5 mg/dL — ABNORMAL LOW (ref 6–20)
CO2: 24 mmol/L (ref 22–32)
Calcium: 7.7 mg/dL — ABNORMAL LOW (ref 8.9–10.3)
Chloride: 106 mmol/L (ref 98–111)
Creatinine, Ser: 0.49 mg/dL (ref 0.44–1.00)
GFR, Estimated: >60 mL/min (ref >60)
Glucose, Bld: 78 mg/dL (ref 70–99)
Potassium: 4 mmol/L (ref 3.5–5.1)
Sodium: 137 mmol/L (ref 135–145)

## 2022-12-10 LAB — MAGNESIUM: Magnesium: 1.9 mg/dL (ref 1.7–2.4)

## 2022-12-10 MED ORDER — LIDOCAINE 5 % EX PTCH: 1.0000 | MEDICATED_PATCH | CUTANEOUS | 0 refills | Status: DC

## 2022-12-10 NOTE — TOC Initial Note (Signed)
Transition of Care [facility]) - Initial/Assessment Note    Patient Details  Name: Abigail [name] [facility] MRN: [id number] Date of Birth: [birth year]  Transition of Care [facility]) CM/SW Contact:    Truddie Hidden, RN Phone Number: 12/10/2022, 10:05 AM  Clinical Narrative:                 SDOH food insecurity risk identified.  Food resources added to AVS.         Patient Goals and CMS Choice            Expected Discharge Plan and Services                                              Prior Living Arrangements/Services                       Activities of Daily Living Home Assistive Devices/Equipment: Environmental consultant (specify type) ADL Screening (condition at time of admission) Patient's cognitive ability adequate to safely complete daily activities?: Yes Is the patient deaf or have difficulty hearing?: No Does the patient have difficulty seeing, even when wearing glasses/contacts?: Yes Does the patient have difficulty concentrating, remembering, or making decisions?: No Patient able to express need for assistance with ADLs?: Yes Does the patient have difficulty dressing or bathing?: No Independently performs ADLs?: Yes (appropriate for developmental age) Does the patient have difficulty walking or climbing stairs?: Yes Weakness of Legs: Left Weakness of Arms/Hands: None  Permission Sought/Granted                  Emotional Assessment              Admission diagnosis:  Hypokalemia [E87.6] Hypoglycemia [E16.2] Opioid overdose, accidental or unintentional, initial encounter [T40.2X1A] Patient Active Problem List   Diagnosis Date Noted   Essential hypertension 12/08/2022   Hypothyroidism 12/08/2022   Hypokalemia 12/08/2022   Hypoglycemia 12/08/2022   Narcotic overdose 12/07/2022   Acute metabolic encephalopathy 12/07/2022   Asthma 04/26/2020   Vertigo 04/26/2020   Drug-induced constipation 04/26/2020   Nausea 04/26/2020   Encounter to establish  care with new doctor 04/26/2020   Gastroesophageal reflux disease 11/12/2016   Chronic prescription opiate use 04/29/2016   Long term current use of immunosuppressive drug 04/29/2016   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 [place] [address] Phone: [phone] Fax: [phone]  [facility] [phone] [place], [place] - [address] [place] [address] Phone: [phone] Fax: [phone]  [id number] - [place], [place] - [address] [place] [address] Phone: [phone] Fax: [phone]     Social Determinants of Health (SDOH) Social History: SDOH Screenings   Food Insecurity: Food Insecurity Present (12/08/2022)  Transportation Needs: No Transportation Needs (12/08/2022)  Tobacco Use: High Risk (12/07/2022)   SDOH Interventions:     Readmission Risk Interventions     No data to display

## 2022-12-10 NOTE — Plan of Care (Signed)
Patient is cooperative with interventions.  Patient is participating in goals of care to meet goals for discharge.  Terrilyn Saver, RN    Problem: Education: Goal: Knowledge of General Education information will improve Description: Including pain rating scale, medication(s)/side effects and non-pharmacologic comfort measures Outcome: Progressing   Problem: Health Behavior/Discharge Planning: Goal: Ability to manage health-related needs will improve Outcome: Progressing   Problem: Clinical Measurements: Goal: Ability to maintain clinical measurements within normal limits will improve Outcome: Progressing Goal: Will remain free from infection Outcome: Progressing Goal: Diagnostic test results will improve Outcome: Progressing Goal: Respiratory complications will improve Outcome: Progressing Goal: Cardiovascular complication will be avoided Outcome: Progressing   Problem: Activity: Goal: Risk for activity intolerance will decrease Outcome: Progressing   Problem: Nutrition: Goal: Adequate nutrition will be maintained Outcome: Progressing   Problem: Coping: Goal: Level of anxiety will decrease Outcome: Progressing   Problem: Elimination: Goal: Will not experience complications related to bowel motility Outcome: Progressing Goal: Will not experience complications related to urinary retention Outcome: Progressing   Problem: Pain Managment: Goal: General experience of comfort will improve Outcome: Progressing   Problem: Safety: Goal: Ability to remain free from injury will improve Outcome: Progressing   Problem: Skin Integrity: Goal: Risk for impaired skin integrity will decrease Outcome: Progressing

## 2022-12-10 NOTE — Discharge Summary (Signed)
Physician Discharge Summary   Patient: Abigail [name] MRN: [id number] DOB: 11-09-1965  Admit date:     12/07/2022  Discharge date: 12/10/22  Discharge Physician: Abigail [name]   PCP: Abigail Medina, MD   Recommendations at discharge:  Please follow-up on hypoglycemia labs-results are pending Please obtain CBC and BMP in 1 week Follow-up with primary care provider Please encourage and actively decrease the dose of opioids.  Discharge Diagnoses: Principal Problem:   Hypoglycemia Active Problems:   Acute metabolic encephalopathy   Chronic prescription opiate use   RA (rheumatoid arthritis)   Essential hypertension   Hypothyroidism   Hypokalemia   Gastroesophageal reflux disease   Depression   Opioid overdose   Hospital Course: Taken from H&P.  [name] is a 57 y.o. female with medical history significant of GERD, rheumatoid arthritis, chronic narcotic abuse, fibromyalgia, chronic pain syndrome, essential hypertension, hypothyroidism, degenerative disc disease, who was brought in with altered mental status.  Patient suspected to have had inadvertent drug overdose.  She takes high-dose MS Contin and oxycodone.    On arrival she was found to have blood sugar of 39.  She has shortness of breath.  Patient has received doses of glucose but no significant improvement.  Blood sugar has dropped despite D10.  At this point started on D10 drip and patient maintained on the drip so being admitted for further evaluation.   She was also found to have significant hypokalemia and mild hyponatremia.  CBC with mild leukocytosis, magnesium 1.7, UA negative for UTI, UDS positive for tricyclic, ironically negative for opioids.  4/20: Vital stable, CBG again dropped to 49 requiring a D50.  D10 rate increased to 200 mL/h.  Potassium remained at 2.3 which is being repleted.  Magnesium 2.  Sodium normalized at 140 now. Checking hypoglycemia labs. Patient feels depressed with  poor p.o. intake for the past 1 week.  Having some diarrhea today, no nausea or vomiting.  Patient denied any suicidal thoughts but appears depressed.  Psych was also consulted for evaluation. She was complaining of right knee pain due to fall, imaging was obtained and it was negative for any acute abnormality.  Patient denies any other recent illness just saying that she was not feeling hungry for a while.  4/21: CBG stabilizes.  Hypoglycemia labs pending.  P.o. intake remained poor.  TSH normal. A.m. cortisol 7.1 which is low normal.  4/22: Vitals and labs stable.  Blood glucose is maintaining mostly in 80s.  Hypoglycemia labs are still pending which can be followed up by PCP.  Patient need encouragement for regular p.o. intake.  Patient was also instructed to slowly decrease her dose of opioids which she was taking for her chronic pain.  She should be taking regular meals.  She will continue on her home medications and need to have a close follow-up by her provider for further recommendations.    Assessment and Plan: * Hypoglycemia Unclear etiology.  Patient with poor p.o. intake for the past week.  No history of diabetes.  Patient denies any illicit or inappropriate drug use.  She denies any suicidal thoughts or ideations. Stating that she was not feeling hungry for more than a week with very poor p.o. intake.  No recent illnesses. -Checking hypoglycemia labs which include insulin-C-peptide and sulfonylurea. -Monitor CBG -Stopping D10 as CBG has been stabilized -Encourage p.o. intake  Acute metabolic encephalopathy Most likely secondary to hypoglycemia.  Improved. CT head negative for any acute abnormality. -Continue to  monitor  Chronic prescription opiate use Patient with history of chronic lower back pain for which she uses pretty high-dose oxycodone.  Per patient she takes her pain medications regularly, ironically UDS was negative for opioids and only shows tricyclic. -Continue  home meds  RA (rheumatoid arthritis) Per patient she has stopped taking her methotrexate. -Outpatient follow-up with rheumatology  Essential hypertension Blood pressure within goal. -Holding home HCTZ as she appears dry  Hypothyroidism Patient stopped taking her Synthroid. -Check TSH -Will resume Synthroid after lab  Hypokalemia Persistent hypokalemia.  Magnesium improved to 2 -Replace potassium and monitor  Gastroesophageal reflux disease -Continue PPI  Depression Continue home Cymbalta and Wellbutrin   Pain control - [place] Controlled Substance Reporting System database was reviewed. and patient was instructed, not to drive, operate heavy machinery, perform activities at heights, swimming or participation in water activities or provide baby-sitting services while on Pain, Sleep and Anxiety Medications; until their outpatient Physician has advised to do so again. Also recommended to not to take more than prescribed Pain, Sleep and Anxiety Medications.  Consultants: Psychiatry Procedures performed: None Disposition: Home Diet recommendation:  Discharge Diet Orders (From admission, onward)     Start     Ordered   12/10/22 0000  Diet - low sodium heart healthy        12/10/22 1110           Regular diet DISCHARGE MEDICATION: Allergies as of 12/10/2022       Reactions   Nsaids Hives   Tapentadol Other (See Comments), Rash, Swelling   Made her deathly sick   Codeine    Darvocet [propoxyphene N-acetaminophen]    Sulfa Antibiotics    Tape         Medication List     STOP taking these medications    cyclobenzaprine 10 MG tablet Commonly known as: FLEXERIL   morphine 15 MG 12 hr tablet Commonly known as: MS CONTIN       TAKE these medications    albuterol 108 (90 Base) MCG/ACT inhaler Commonly known as: VENTOLIN HFA Inhale 1-2 puffs into the lungs every 6 (six) hours as needed for wheezing or shortness of breath.   atorvastatin 40 MG  tablet Commonly known as: LIPITOR   buPROPion 150 MG 24 hr tablet Commonly known as: WELLBUTRIN XL Take 150 mg by mouth daily.   DULoxetine 60 MG capsule Commonly known as: CYMBALTA Take 60 mg by mouth 2 (two) times daily.   folic acid 1 MG tablet Commonly known as: FOLVITE Take 1 mg by mouth daily.   gabapentin 300 MG capsule Commonly known as: NEURONTIN Take 300-600 mg by mouth 3 (three) times daily.   hydrochlorothiazide 12.5 MG tablet Commonly known as: HYDRODIURIL Take 12.5 mg by mouth daily.   levothyroxine 75 MCG tablet Commonly known as: SYNTHROID Take 75 mcg by mouth daily before breakfast.   lidocaine 5 % Commonly known as: LIDODERM Place 1 patch onto the skin daily. Remove & Discard patch within 12 hours or as directed by MD   meclizine 25 MG tablet Commonly known as: ANTIVERT Take 1 tablet (25 mg total) by mouth 3 (three) times daily as needed for dizziness.   methotrexate 2.5 MG tablet Commonly known as: RHEUMATREX Take 7.5 mg by mouth 3 (three) times a week.   mupirocin ointment 2 % Commonly known as: BACTROBAN Apply 1 application  topically daily. Apply to scratches and bump at nose until healed. What changed: Another medication with the same name  was removed. Continue taking this medication, and follow the directions you see here.   Oxycodone HCl 20 MG Tabs Take 0.5 tablets by mouth every 6 (six) hours as needed.   pantoprazole 40 MG tablet Commonly known as: PROTONIX Take 40 mg by mouth daily.   polyethylene glycol powder 17 GM/SCOOP powder Commonly known as: GLYCOLAX/MIRALAX Take 17 g by mouth 2 (two) times daily as needed.   potassium chloride 10 MEQ tablet Commonly known as: KLOR-CON Take 10 mEq by mouth daily.   promethazine 25 MG tablet Commonly known as: PHENERGAN Take 1 tablet (25 mg total) by mouth every 8 (eight) hours as needed for nausea or vomiting.   thiamine 100 MG tablet Commonly known as: VITAMIN B1 Take 1 tablet by  mouth daily.   Vitamin D3 10 MCG (400 UNIT) tablet Take 400 Units by mouth daily.        Follow-up Information     [name], [name], MD. Schedule an appointment as soon as possible for a visit in 1 week(s).   Specialty: Family Medicine Contact information: [address] [place] [address] [phone]                Discharge Exam: Ceasar Mons Weights   12/07/22 2049 12/09/22 0348  Weight: 89.2 kg 93.5 kg   General.  Obese lady, in no acute distress. Pulmonary.  Lungs clear bilaterally, normal respiratory effort. CV.  Regular rate and rhythm, no JVD, rub or murmur. Abdomen.  Soft, nontender, nondistended, BS positive. CNS.  Alert and oriented .  No focal neurologic deficit. Extremities.  No edema, no cyanosis, pulses intact and symmetrical. Psychiatry.  Judgment and insight appears normal.  Flat affect  Condition at discharge: stable  The results of significant diagnostics from this hospitalization (including imaging, microbiology, ancillary and laboratory) are listed below for reference.   Imaging Studies: DG Knee Complete 4 Views Right  Result Date: 12/08/2022 CLINICAL DATA:  Right knee pain and limited range of motion after a fall. Initial encounter. EXAM: RIGHT KNEE - COMPLETE 4+ VIEW COMPARISON:  None Available. FINDINGS: No evidence of fracture, dislocation, or joint effusion. There is mild medial compartment joint space narrowing and osteophytosis. Soft tissues are unremarkable. IMPRESSION: No acute finding. Mild medial compartment degenerative change. Electronically Signed   By: Drusilla Kanner M.D.   On: 12/08/2022 08:44   CT Head Wo Contrast  Result Date: 12/07/2022 CLINICAL DATA:  Altered mental status EXAM: CT HEAD WITHOUT CONTRAST TECHNIQUE: Contiguous axial images were obtained from the base of the skull through the vertex without intravenous contrast. RADIATION DOSE REDUCTION: This exam was performed according to the departmental dose-optimization program  which includes automated exposure control, adjustment of the mA and/or kV according to patient size and/or use of iterative reconstruction technique. COMPARISON:  None Available. FINDINGS: Brain: No evidence of acute infarction, hemorrhage, hydrocephalus, extra-axial collection or mass lesion/mass effect. Vascular: No hyperdense vessel or unexpected calcification. Skull: Normal. Negative for fracture or focal lesion. Sinuses/Orbits: There is dense opacification in atelectasis of the left maxillary sinus in keeping with changes of chronic sinusitis. Remaining paranasal sinuses are clear. Orbits are unremarkable. Other: Mastoid air cells and middle ear cavities are clear IMPRESSION: 1. No acute intracranial abnormality. 2. Chronic left maxillary sinusitis. Electronically Signed   By: Helyn Numbers M.D.   On: 12/07/2022 21:24   DG Chest Port 1 View  Result Date: 12/07/2022 CLINICAL DATA:  Dyspnea, hypoglycemia EXAM: PORTABLE CHEST 1 VIEW COMPARISON:  None Available. FINDINGS: Lungs  volumes are small, but are symmetric and are clear. No pneumothorax or pleural effusion. Cardiac size within normal limits. Pulmonary vascularity is normal. Osseous structures are age-appropriate. No acute bone abnormality. IMPRESSION: No active disease. Electronically Signed   By: Helyn Numbers M.D.   On: 12/07/2022 21:00    Microbiology: Results for orders placed or performed in visit on 07/03/19  Novel Coronavirus, NAA ([facility])     Status: None   Collection Time: 07/03/19 12:00 AM   Specimen: Nasopharyngeal(NP) swabs in vial transport medium   NASOPHARYNGE  TESTING  Result Value Ref Range Status   SARS-CoV-2, NAA Not Detected Not Detected Final    Comment: This nucleic acid amplification test was developed and its performance characteristics determined by [employer]. Nucleic acid amplification tests include PCR and TMA. This test has not been FDA cleared or approved. This test has been authorized by FDA under  an Emergency Use Authorization (EUA). This test is only authorized for the duration of time the declaration that circumstances exist justifying the authorization of the emergency use of in vitro diagnostic tests for detection of SARS-CoV-2 virus and/or diagnosis of COVID-19 infection under section 564(b)(1) of the Act, 21 U.S.C. 161WRU-0(A) (1), unless the authorization is terminated or revoked sooner. When diagnostic testing is negative, the possibility of a false negative result should be considered in the context of a patient's recent exposures and the presence of clinical signs and symptoms consistent with COVID-19. An individual without symptoms of COVID-19 and who is not shedding SARS-CoV-2 virus would  expect to have a negative (not detected) result in this assay.     Labs: CBC: Recent Labs  Lab 12/07/22 2051 12/08/22 0459 12/10/22 0507  WBC 12.3* 8.3 8.8  NEUTROABS 8.0*  --   --   HGB 12.8 13.1 12.2  HCT 38.1 41.2 39.4  MCV 89.4 93.0 95.4  PLT 253 192 191   Basic Metabolic Panel: Recent Labs  Lab 12/07/22 2051 12/08/22 0459 12/09/22 2116 12/10/22 0507  NA 131* 140 133* 137  K 2.3* 2.3* 3.5 4.0  CL 97* 105 103 106  CO2 26 28 26 24   GLUCOSE 39* 49* 77 78  BUN 5* <5* <5* <5*  CREATININE 0.70 0.50 0.54 0.49  CALCIUM 7.6* 7.4* 7.3* 7.7*  MG 1.7 2.0 1.6* 1.9  PHOS 3.3  --   --   --    Liver Function Tests: Recent Labs  Lab 12/07/22 2051 12/08/22 0459  AST 31 25  ALT 13 14  ALKPHOS 119 119  BILITOT 0.7 0.5  PROT 5.3* 5.2*  ALBUMIN 1.9* 1.9*   CBG: Recent Labs  Lab 12/09/22 1948 12/09/22 2354 12/10/22 0415 12/10/22 0843 12/10/22 1056  GLUCAP 118* 85 80 121* 119*    Discharge time spent: greater than 30 minutes.  This record has been created using Conservation officer, historic buildings. Errors have been sought and corrected,but may not always be located. Such creation errors do not reflect on the standard of care.   Signed: Arnetha Courser,  MD [facility] 12/10/2022

## 2022-12-11 LAB — INSULIN AND C-PEPTIDE, SERUM
C-Peptide: 2.3 ng/mL (ref 1.1–4.4)
Insulin: 10.3 u[IU]/mL (ref 2.6–24.9)

## 2022-12-14 ENCOUNTER — Emergency Department: Payer: Medicare HMO

## 2022-12-14 ENCOUNTER — Inpatient Hospital Stay
Admission: EM | Admit: 2022-12-14 | Discharge: 2022-12-18 | DRG: 439 | Disposition: A | Discharge status: Other Acute Inpatient Hospital | Payer: Medicare HMO | Attending: Internal Medicine | Admitting: Internal Medicine

## 2022-12-14 ENCOUNTER — Other Ambulatory Visit: Payer: Self-pay

## 2022-12-14 DIAGNOSIS — F32A Depression, unspecified: Secondary | ICD-10-CM | POA: Diagnosis present

## 2022-12-14 DIAGNOSIS — G8929 Other chronic pain: Secondary | ICD-10-CM | POA: Diagnosis not present

## 2022-12-14 DIAGNOSIS — F17210 Nicotine dependence, cigarettes, uncomplicated: Secondary | ICD-10-CM | POA: Diagnosis present

## 2022-12-14 DIAGNOSIS — K863 Pseudocyst of pancreas: Secondary | ICD-10-CM | POA: Diagnosis present

## 2022-12-14 DIAGNOSIS — E559 Vitamin D deficiency, unspecified: Secondary | ICD-10-CM | POA: Diagnosis present

## 2022-12-14 DIAGNOSIS — Z66 Do not resuscitate: Secondary | ICD-10-CM | POA: Diagnosis present

## 2022-12-14 DIAGNOSIS — K859 Acute pancreatitis without necrosis or infection, unspecified: Secondary | ICD-10-CM | POA: Diagnosis not present

## 2022-12-14 DIAGNOSIS — Z85828 Personal history of other malignant neoplasm of skin: Secondary | ICD-10-CM | POA: Diagnosis not present

## 2022-12-14 DIAGNOSIS — F419 Anxiety disorder, unspecified: Secondary | ICD-10-CM | POA: Diagnosis present

## 2022-12-14 DIAGNOSIS — Z888 Allergy status to other drugs, medicaments and biological substances status: Secondary | ICD-10-CM

## 2022-12-14 DIAGNOSIS — Z886 Allergy status to analgesic agent status: Secondary | ICD-10-CM

## 2022-12-14 DIAGNOSIS — G9340 Encephalopathy, unspecified: Secondary | ICD-10-CM

## 2022-12-14 DIAGNOSIS — G9341 Metabolic encephalopathy: Secondary | ICD-10-CM | POA: Diagnosis present

## 2022-12-14 DIAGNOSIS — Z885 Allergy status to narcotic agent status: Secondary | ICD-10-CM

## 2022-12-14 DIAGNOSIS — K219 Gastro-esophageal reflux disease without esophagitis: Secondary | ICD-10-CM | POA: Diagnosis present

## 2022-12-14 DIAGNOSIS — E785 Hyperlipidemia, unspecified: Secondary | ICD-10-CM | POA: Diagnosis present

## 2022-12-14 DIAGNOSIS — R627 Adult failure to thrive: Secondary | ICD-10-CM | POA: Diagnosis present

## 2022-12-14 DIAGNOSIS — J320 Chronic maxillary sinusitis: Secondary | ICD-10-CM | POA: Diagnosis present

## 2022-12-14 DIAGNOSIS — K861 Other chronic pancreatitis: Secondary | ICD-10-CM | POA: Diagnosis present

## 2022-12-14 DIAGNOSIS — M069 Rheumatoid arthritis, unspecified: Secondary | ICD-10-CM | POA: Diagnosis present

## 2022-12-14 DIAGNOSIS — Z79891 Long term (current) use of opiate analgesic: Secondary | ICD-10-CM | POA: Diagnosis not present

## 2022-12-14 DIAGNOSIS — E039 Hypothyroidism, unspecified: Secondary | ICD-10-CM | POA: Diagnosis present

## 2022-12-14 DIAGNOSIS — E876 Hypokalemia: Secondary | ICD-10-CM | POA: Diagnosis not present

## 2022-12-14 DIAGNOSIS — G40901 Epilepsy, unspecified, not intractable, with status epilepticus: Secondary | ICD-10-CM

## 2022-12-14 DIAGNOSIS — M316 Other giant cell arteritis: Secondary | ICD-10-CM | POA: Diagnosis present

## 2022-12-14 DIAGNOSIS — K529 Noninfective gastroenteritis and colitis, unspecified: Secondary | ICD-10-CM | POA: Diagnosis not present

## 2022-12-14 DIAGNOSIS — I10 Essential (primary) hypertension: Secondary | ICD-10-CM | POA: Diagnosis present

## 2022-12-14 DIAGNOSIS — D649 Anemia, unspecified: Secondary | ICD-10-CM | POA: Diagnosis present

## 2022-12-14 DIAGNOSIS — Z79899 Other long term (current) drug therapy: Secondary | ICD-10-CM

## 2022-12-14 DIAGNOSIS — G894 Chronic pain syndrome: Secondary | ICD-10-CM | POA: Diagnosis present

## 2022-12-14 DIAGNOSIS — Z981 Arthrodesis status: Secondary | ICD-10-CM

## 2022-12-14 DIAGNOSIS — E162 Hypoglycemia, unspecified: Secondary | ICD-10-CM | POA: Diagnosis present

## 2022-12-14 DIAGNOSIS — K760 Fatty (change of) liver, not elsewhere classified: Secondary | ICD-10-CM | POA: Diagnosis present

## 2022-12-14 DIAGNOSIS — M797 Fibromyalgia: Secondary | ICD-10-CM | POA: Diagnosis present

## 2022-12-14 DIAGNOSIS — Z882 Allergy status to sulfonamides status: Secondary | ICD-10-CM

## 2022-12-14 DIAGNOSIS — Z96649 Presence of unspecified artificial hip joint: Secondary | ICD-10-CM | POA: Diagnosis present

## 2022-12-14 LAB — BASIC METABOLIC PANEL
Anion gap: 10 (ref 5–15)
Anion gap: 12 (ref 5–15)
BUN: 6 mg/dL (ref 6–20)
BUN: <5 mg/dL — ABNORMAL LOW (ref 6–20)
CO2: 29 mmol/L (ref 22–32)
CO2: 27 mmol/L (ref 22–32)
Calcium: 7.4 mg/dL — ABNORMAL LOW (ref 8.9–10.3)
Calcium: 7.4 mg/dL — ABNORMAL LOW (ref 8.9–10.3)
Chloride: 96 mmol/L — ABNORMAL LOW (ref 98–111)
Chloride: 97 mmol/L — ABNORMAL LOW (ref 98–111)
Creatinine, Ser: 0.46 mg/dL (ref 0.44–1.00)
Creatinine, Ser: 0.57 mg/dL (ref 0.44–1.00)
GFR, Estimated: >60 mL/min (ref >60)
GFR, Estimated: >60 mL/min (ref >60)
Glucose, Bld: 79 mg/dL (ref 70–99)
Glucose, Bld: 76 mg/dL (ref 70–99)
Potassium: 3 mmol/L — ABNORMAL LOW (ref 3.5–5.1)
Potassium: 2.7 mmol/L — CL (ref 3.5–5.1)
Sodium: 135 mmol/L (ref 135–145)
Sodium: 136 mmol/L (ref 135–145)

## 2022-12-14 LAB — HEPATIC FUNCTION PANEL
ALT: 22 U/L (ref 0–44)
AST: 64 U/L — ABNORMAL HIGH (ref 15–41)
Albumin: 1.8 g/dL — ABNORMAL LOW (ref 3.5–5.0)
Alkaline Phosphatase: 96 U/L (ref 38–126)
Bilirubin, Direct: 0.2 mg/dL (ref 0.0–0.2)
Indirect Bilirubin: 1 mg/dL — ABNORMAL HIGH (ref 0.3–0.9)
Total Bilirubin: 1.2 mg/dL (ref 0.3–1.2)
Total Protein: 5 g/dL — ABNORMAL LOW (ref 6.5–8.1)

## 2022-12-14 LAB — SULFONYLUREA HYPOGLYCEMICS PANEL, SERUM
Acetohexamide: NEGATIVE ug/mL (ref 20–60)
Chlorpropamide: NEGATIVE ug/mL (ref 75–250)
Glimepiride: NEGATIVE ng/mL (ref 80–250)
Glipizide: NEGATIVE ng/mL (ref 200–1000)
Glyburide: NEGATIVE ng/mL
Nateglinide: NEGATIVE ng/mL
Repaglinide: NEGATIVE ng/mL
Tolazamide: NEGATIVE ug/mL
Tolbutamide: NEGATIVE ug/mL (ref 40–100)

## 2022-12-14 LAB — CBC
HCT: 33.5 % — ABNORMAL LOW (ref 36.0–46.0)
Hemoglobin: 10.8 g/dL — ABNORMAL LOW (ref 12.0–15.0)
MCH: 29.6 pg (ref 26.0–34.0)
MCHC: 32.2 g/dL (ref 30.0–36.0)
MCV: 91.8 fL (ref 80.0–100.0)
Platelets: 224 10*3/uL (ref 150–400)
RBC: 3.65 MIL/uL — ABNORMAL LOW (ref 3.87–5.11)
RDW: 13.5 % (ref 11.5–15.5)
WBC: 6.3 10*3/uL (ref 4.0–10.5)
nRBC: 0 % (ref 0.0–0.2)

## 2022-12-14 LAB — GLUCOSE, CAPILLARY: Glucose-Capillary: 68 mg/dL — ABNORMAL LOW (ref 70–99)

## 2022-12-14 LAB — TROPONIN I (HIGH SENSITIVITY)
Troponin I (High Sensitivity): 5 ng/L (ref <18)
Troponin I (High Sensitivity): 4 ng/L (ref <18)

## 2022-12-14 LAB — AMMONIA: Ammonia: <10 umol/L (ref 9–35)

## 2022-12-14 LAB — ETHANOL: Alcohol, Ethyl (B): <10 mg/dL (ref <10)

## 2022-12-14 LAB — MAGNESIUM: Magnesium: 1.8 mg/dL (ref 1.7–2.4)

## 2022-12-14 LAB — LIPASE, BLOOD: Lipase: 53 U/L — ABNORMAL HIGH (ref 11–51)

## 2022-12-14 MED ORDER — SODIUM CHLORIDE 0.9 % IV SOLN: INTRAVENOUS | Status: DC

## 2022-12-14 MED ORDER — KCL IN DEXTROSE-NACL 20-5-0.45 MEQ/L-%-% IV SOLN
INTRAVENOUS | Status: DC
  Filled 2022-12-14 (×12): qty 1000

## 2022-12-14 MED ORDER — ONDANSETRON HCL 4 MG/2ML IJ SOLN: 4.0000 mg | Freq: Four times a day (QID) | INTRAMUSCULAR | Status: DC | PRN

## 2022-12-14 MED ORDER — LACTATED RINGERS IV BOLUS
1000.0000 mL | Freq: Once | INTRAVENOUS | Status: AC
  Administered 2022-12-14: 1000 mL via INTRAVENOUS

## 2022-12-14 MED ORDER — POTASSIUM CHLORIDE IN NACL 20-0.9 MEQ/L-% IV SOLN
INTRAVENOUS | Status: DC
  Filled 2022-12-14 (×2): qty 1000

## 2022-12-14 MED ORDER — ENOXAPARIN SODIUM 40 MG/0.4ML IJ SOSY
40.0000 mg | PREFILLED_SYRINGE | INTRAMUSCULAR | Status: DC
  Administered 2022-12-14 – 2022-12-18 (×5): 40 mg via SUBCUTANEOUS
  Filled 2022-12-14 (×5): qty 0.4

## 2022-12-14 MED ORDER — POTASSIUM CHLORIDE 10 MEQ/100ML IV SOLN
10.0000 meq | INTRAVENOUS | Status: DC
  Administered 2022-12-15: 10 meq via INTRAVENOUS
  Filled 2022-12-14: qty 100

## 2022-12-14 MED ORDER — ONDANSETRON HCL 4 MG PO TABS: 4.0000 mg | ORAL_TABLET | Freq: Four times a day (QID) | ORAL | Status: DC | PRN

## 2022-12-14 MED ORDER — SODIUM CHLORIDE 0.9 % IV SOLN
2.0000 g | Freq: Once | INTRAVENOUS | Status: AC
  Administered 2022-12-14: 2 g via INTRAVENOUS
  Filled 2022-12-14: qty 20

## 2022-12-14 MED ORDER — METRONIDAZOLE 500 MG/100ML IV SOLN
500.0000 mg | Freq: Once | INTRAVENOUS | Status: AC
  Administered 2022-12-14: 500 mg via INTRAVENOUS
  Filled 2022-12-14 (×2): qty 100

## 2022-12-14 MED ORDER — HYDROMORPHONE HCL 1 MG/ML IJ SOLN
0.5000 mg | INTRAMUSCULAR | Status: DC | PRN
  Administered 2022-12-14 – 2022-12-16 (×5): 0.5 mg via INTRAVENOUS
  Filled 2022-12-14 (×5): qty 1

## 2022-12-14 MED ORDER — IOHEXOL 300 MG/ML  SOLN
100.0000 mL | Freq: Once | INTRAMUSCULAR | Status: AC | PRN
  Administered 2022-12-14: 100 mL via INTRAVENOUS

## 2022-12-14 MED ORDER — SODIUM CHLORIDE 0.9 % IV BOLUS: 500.0000 mL | Freq: Once | INTRAVENOUS | Status: DC

## 2022-12-14 MED ORDER — SODIUM CHLORIDE 0.9 % IV SOLN
2.0000 g | INTRAVENOUS | Status: DC
  Administered 2022-12-15 – 2022-12-18 (×3): 2 g via INTRAVENOUS
  Filled 2022-12-14 (×2): qty 2
  Filled 2022-12-14: qty 20
  Filled 2022-12-14: qty 2

## 2022-12-14 MED ORDER — METRONIDAZOLE 500 MG/100ML IV SOLN
500.0000 mg | Freq: Two times a day (BID) | INTRAVENOUS | Status: DC
  Administered 2022-12-15 – 2022-12-18 (×7): 500 mg via INTRAVENOUS
  Filled 2022-12-14 (×8): qty 100

## 2022-12-14 MED ORDER — LACTATED RINGERS IV SOLN: INTRAVENOUS | Status: DC

## 2022-12-14 NOTE — Assessment & Plan Note (Signed)
PPI ?

## 2022-12-14 NOTE — Assessment & Plan Note (Addendum)
Patient acutely confused with some intermittent?  Hallucinations Is not answering questions Noted recent admission with encephalopathy in setting of hypoglycemia Blood sugar 79 today EtOH and UDS are pending CT head grossly stable apart from maxillary sinus disease Given presentation, there is some concern for psychiatric component-noted baseline depression on Cymbalta and Wellbutrin Will formally consult behavioral health to assess Noted chronic narcotic use Prudent pain control in the setting of pancreatitis

## 2022-12-14 NOTE — Assessment & Plan Note (Addendum)
Noted imaging concerning for colitis in the setting of concomitant pancreatitis with pseudocysts IV Rocephin and Flagyl for infectious coverage Afebrile without leukocytosis at present De-escalate antibiotics as appropriate Unclear if patient has having any active diarrhea in the setting of encephalopathy-not answering questions Stool studies Dr. Timothy Lasso aware of case Follow-up formal gastroenterology recommendations

## 2022-12-14 NOTE — Assessment & Plan Note (Addendum)
Noted baseline heavy oxycodone use in the setting of chronic back pain Concurrent pancreatitis Prudent pain control in setting of pancreatitis

## 2022-12-14 NOTE — Assessment & Plan Note (Signed)
Noted imaging concerning for colitis in the setting of concomitant pancreatitis with pseudocysts IV Rocephin and Flagyl for infectious coverage Afebrile without leukocytosis at present De-escalate antibiotics as appropriate Unclear if patient has having any active diarrhea in the setting of encephalopathy-not answering questions Nonacute abdomen at present  Stool studies Dr. Timothy Lasso aware of case Follow-up formal gastroenterology recommendations

## 2022-12-14 NOTE — Assessment & Plan Note (Signed)
Synthroid once diet gets advanced

## 2022-12-14 NOTE — Assessment & Plan Note (Signed)
Noted significant pancreatitis on imaging with several pseudocysts Lipase 53 LFTs reassuring Overall stable clinical exam with 9 benign belly at present Unclear if this is an acute versus acute on chronic issue Baseline encephalopathy is a major complicating issues patient is minimally answering questions Case discussed with Dr. Timothy Lasso who will evaluate the patient in the morning Pain control N.p.o. IV fluids Follow-up

## 2022-12-14 NOTE — H&P (Addendum)
History and Physical    Patient: Abigail [name] [place] [name] WUJ:[id number] DOB: Jul 21, 1966 DOA: 12/14/2022 DOS: the patient was seen and examined on 12/14/2022 PCP: Alease Medina, MD  Patient coming from: Home  Chief Complaint:  Chief Complaint  Patient presents with   Failure To Thrive   HPI: Abigail [name] is a 57 y.o. female with medical history significant of anxiety, fibromyalgia, chronic pain, IBS, colitis presenting with pancreatitis, colitis, encephalopathy, hypokalemia.  Limited history in the setting of encephalopathy.  Patient not answering many questions with some intermittent?  Hallucinations.  Per report, decreased p.o. intake in the setting of abdominal pain.  Patient does not specify abdominal pain.  No reported chest pain, shortness of breath, nausea or vomiting.  No reported diarrhea.  Patient does not answer if she uses any alcohol or illicit drugs.  Noted recent admission April 19 through April 22 for metabolic encephalopathy in setting of hypoglycemia. Presented to the ER afebrile, hemodynamically stable.  Satting well on room air.  White count 6.3, hemoglobin 10.8, creatinine 0.46, potassium 3.0, albumin 1.8, AST 64, ALT 22.  T. bili 1.2.  Lipase 53.  CT abdomen pelvis with pancreatitis with multiple pseudocyst formation as well as right-sided colitis. Review of Systems: unable to review all systems due to the inability of the patient to answer questions. Past Medical History:  Diagnosis Date   Anxiety    Arthritis    Back pain    Basal cell carcinoma 10/04/2021   R axilla - needs excised 11/28/21   Basal cell carcinoma 10/04/2021   L antecubital excised 11/14/21   Diarrhea 11/12/2016   Fibromyalgia    Generalized abdominal pain 11/12/2016   H. pylori infection    Hyperlipidemia    IBS (irritable bowel syndrome)    Infectious colitis 04/29/2016   Migraines    Moderate dehydration 04/29/2016   Muscle pain    Reflux    Unexplained weight loss  11/12/2016   Past Surgical History:  Procedure Laterality Date   ABDOMINAL HYSTERECTOMY     APPENDECTOMY  2009   C5 FUSION     C6 FUSION     C7 FUSION     COLONOSCOPY  02/2006   COLONOSCOPY WITH PROPOFOL N/A 12/25/2016   Procedure: COLONOSCOPY WITH PROPOFOL;  Surgeon: Earline Mayotte, MD;  Location: [facility] ENDOSCOPY;  Service: Endoscopy;  Laterality: N/A;   ESOPHAGOGASTRODUODENOSCOPY (EGD) WITH PROPOFOL N/A 12/25/2016   Procedure: ESOPHAGOGASTRODUODENOSCOPY (EGD) WITH PROPOFOL;  Surgeon: Earline Mayotte, MD;  Location: [facility] ENDOSCOPY;  Service: Endoscopy;  Laterality: N/A;   FOOT SURGERY     HIP ARTHROPLASTY     L4 FUSION     L5 FUSION     S1 FUSION     Social History:  reports that she has been smoking cigarettes. She has been smoking an average of 1 pack per day. She has never used smokeless tobacco. She reports current alcohol use. She reports that she does not use drugs.  Allergies  Allergen Reactions   Nsaids Hives   Tapentadol Other (See Comments), Rash and Swelling    Made her deathly sick   Codeine    Darvocet [Propoxyphene N-Acetaminophen]    Sulfa Antibiotics    Tape     No family history on file.  Prior to Admission medications   Medication Sig Start Date End Date Taking? Authorizing Provider  albuterol (VENTOLIN HFA) 108 (90 Base) MCG/ACT inhaler Inhale 1-2 puffs into the lungs every 6 (six) hours as needed  for wheezing or shortness of breath. 10/15/19  Yes [provider]  Cholecalciferol (VITAMIN D3) 10 MCG (400 UNIT) tablet Take 400 Units by mouth daily. 08/25/22  Yes [provider]  DULoxetine (CYMBALTA) 60 MG capsule Take 60 mg by mouth 2 (two) times daily.   Yes [provider]  gabapentin (NEURONTIN) 300 MG capsule Take 300-600 mg by mouth 3 (three) times daily. 09/17/19  Yes [provider]  Oxycodone HCl 20 MG TABS Take 0.5 tablets by mouth every 6 (six) hours as needed. 11/23/22  Yes [provider]  pantoprazole  (PROTONIX) 40 MG tablet Take 40 mg by mouth daily.   Yes [provider]  potassium chloride (KLOR-CON) 10 MEQ tablet Take 10 mEq by mouth daily. 11/15/22  Yes [provider]  promethazine (PHENERGAN) 25 MG tablet Take 1 tablet (25 mg total) by mouth every 8 (eight) hours as needed for nausea or vomiting. 04/26/20  Yes Malfi, Jodelle Gross, FNP  thiamine (VITAMIN B1) 100 MG tablet Take 1 tablet by mouth daily. 11/10/22  Yes [provider]  atorvastatin (LIPITOR) 40 MG tablet  09/03/19   [provider]  buPROPion (WELLBUTRIN XL) 150 MG 24 hr tablet Take 150 mg by mouth daily. 01/01/21   [provider]  folic acid (FOLVITE) 1 MG tablet Take 1 mg by mouth daily. Patient not taking: Reported on 12/08/2022    [provider]  hydrochlorothiazide (HYDRODIURIL) 12.5 MG tablet Take 12.5 mg by mouth daily. Patient not taking: Reported on 12/14/2022 09/11/19   [provider]  levothyroxine (SYNTHROID, LEVOTHROID) 75 MCG tablet Take 75 mcg by mouth daily before breakfast. Patient not taking: Reported on 12/08/2022    [provider]  lidocaine (LIDODERM) 5 % Place 1 patch onto the skin daily. Remove & Discard patch within 12 hours or as directed by MD 12/10/22   Arnetha Courser, MD  meclizine (ANTIVERT) 25 MG tablet Take 1 tablet (25 mg total) by mouth 3 (three) times daily as needed for dizziness. Patient not taking: Reported on 12/08/2022 04/26/20   Tarri Fuller, FNP  methotrexate (RHEUMATREX) 2.5 MG tablet Take 7.5 mg by mouth 3 (three) times a week. Patient not taking: Reported on 12/08/2022    [provider]  mupirocin ointment (BACTROBAN) 2 % Apply 1 application  topically daily. Apply to scratches and bump at nose until healed. 01/29/22   Deirdre Evener, MD  polyethylene glycol powder [facility]) 17 GM/SCOOP powder Take 17 g by mouth 2 (two) times daily as needed. 04/26/20   Tarri Fuller, FNP    Physical Exam: Vitals:    12/14/22 1405 12/14/22 1700 12/14/22 1809  BP: 116/74 (!) 156/81   Pulse: 94 77 79  Resp: 20 (!) 22 20  Temp: 98.1 F (36.7 C)  (!) 97.5 F (36.4 C)  TempSrc: Axillary    SpO2: 100% 100% 100%   Physical Exam Constitutional:      Appearance: She is obese.     Comments: Positive mild generalized confusion  HENT:     Head: Normocephalic.     Mouth/Throat:     Mouth: Mucous membranes are moist.  Eyes:     Pupils: Pupils are equal, round, and reactive to light.  Cardiovascular:     Rate and Rhythm: Normal rate and regular rhythm.  Pulmonary:     Effort: Pulmonary effort is normal.  Abdominal:     General: Bowel sounds are normal.     Tenderness: There is  abdominal tenderness.  Musculoskeletal:        General: Normal range of motion.  Skin:    General: Skin is warm.  Neurological:     Comments: Positive generalized confusion Patient answering questions  Psychiatric:        Mood and Affect: Mood normal.     Data Reviewed:  There are no new results to review at this time. CT ABDOMEN PELVIS W CONTRAST CLINICAL DATA:  Failure to thrive.  Pain.  EXAM: CT ABDOMEN AND PELVIS WITH CONTRAST  TECHNIQUE: Multidetector CT imaging of the abdomen and pelvis was performed using the standard protocol following bolus administration of intravenous contrast.  RADIATION DOSE REDUCTION: This exam was performed according to the departmental dose-optimization program which includes automated exposure control, adjustment of the mA and/or kV according to patient size and/or use of iterative reconstruction technique.  CONTRAST:  OMNIPAQUE IOHEXOL 300 MG/ML  SOLN  COMPARISON:  CT 05/24/2014.  X-ray 12/14/2022  FINDINGS: Lower chest: Mild linear opacity lung bases likely scar or atelectasis. Trace pleural fluid. Significant breathing motion at the lung bases.  Hepatobiliary: Diffuse fatty liver infiltration. Areas of sparing along the gallbladder fossa. Patent portal vein.  Gallbladder is nondilated.  Pancreas: There is peripancreatic fat stranding. Several cystic areas are identified involving the pancreas itself in the surrounding tissues. These include of the pancreatic head involving the pancreatic groove for example on series 2, image 34 measuring 2.5 by 1.9 cm. Focus more superior on series 2, image 29 measures 19 by 15 mm. Several smaller foci in the midbody of the pancreas on image 27 of series 2, image 24 of series 2. There is also some loculated fluid collections abutting the tail of the pancreas of the greater curve of the stomach with the adjacent wall thickening of the stomach. Example on series 2, image 19 measures 3.3 x 1.9 cm. Based on overall appearance these could be the sequela of pancreatitis with pseudocyst formation. Please correlate with specific clinical history.  Spleen: Normal in size without focal abnormality. Small splenules are noted.  Adrenals/Urinary Tract: The adrenal glands are unremarkable. Kidneys are without enhancing mass or collecting system dilatation. The ureters have normal course and caliber down to the bladder which has a preserved contour.  Stomach/Bowel: Large bowel is nondilated but has diffuse wall thickening greatest involving the ascending colon. Please correlate for colitis. Surgical changes along the base of the cecum in the appendix is not seen. The stomach is mildly distended with fluid. There is wall thickening along the second portion of the duodenum. There are some mildly distended proximal jejunal loops with mild wall thickening. Ileum is decompressed.  Vascular/Lymphatic: Normal caliber aorta and IVC with scattered vascular calcifications. Calcifications are also seen along the branch vessels. No specific abnormal lymph node enlargement identified in the pelvis. There are some enlarged nodes in the porta hepatis. Example on series 2, image 27 measuring 2.1 x 1.2 cm. Focus portacaval series  2, image 24 measures 19 by 11 mm. Other porta hepatic nodes as well such as image 24 of series 2. Anterior to the portal vein.  Reproductive: Uterus and bilateral adnexa are unremarkable.  Other: Anasarca. Mesenteric stranding. No free air. No frank ascites.  Musculoskeletal: Curvature of the spine. Moderate degenerative changes of the spine and pelvis. There is significant streak artifact related to the right hip arthroplasty in the lower lumbar spine fixation hardware. There is moderate compression as well of the L1 level which is new from 2015 but  has sclerotic margins. This could be more chronic there is also significant compression along the lower thoracic spine at T9, T8 and T7. Based on appearance this could be more subacute but age-indeterminate. Please correlate for any known history or dedicated evaluation when appropriate  IMPRESSION: Significant stranding along the pancreas with several loculated fluid collections identified involving the pancreas well as adjacent tissues. This includes the pancreatic groove as well as tail of the pancreas of the against the greater curve of the stomach. Based on the overall appearance this could be related to evolution of the pancreatitis and pseudocyst formation. Please correlate with specific clinical presentation.  Wall thickening along the colon diffusely greatest along the right side with some stranding. Please correlate for colitis is also some mild distention with some fold thickening along loops of jejunum.  Fatty liver infiltration.  Several enlarged lymph nodes in the porta hepatis right upper quadrant. This has a broad differential with the other findings and recommend attention on short follow-up  Trace pleural fluid.  Multilevel compression deformities along the spine. Those involving the mid to lower thoracic spine may be more subacute but are age-indeterminate and recommend further evaluation if there is no known  history.  Electronically Signed   By: Karen Kays M.D.   On: 12/14/2022 16:27 CT HEAD WO CONTRAST ( ) CLINICAL DATA:  Mental status change, cause.  Failure to thrive.  EXAM: CT HEAD WITHOUT CONTRAST  TECHNIQUE: Contiguous axial images were obtained from the base of the skull through the vertex without intravenous contrast.  RADIATION DOSE REDUCTION: This exam was performed according to the departmental dose-optimization program which includes automated exposure control, adjustment of the mA and/or kV according to patient size and/or use of iterative reconstruction technique.  COMPARISON:  CT head without contrast 4/19/4.  FINDINGS: Brain: No acute infarct, hemorrhage, or mass lesion is present. Mild white matter changes in the anterior limb of the internal capsule bilaterally are stable. Basal ganglia are intact. Insular ribbon is bilaterally. The ventricles are of normal size. No significant extraaxial fluid collection is present.  The brainstem and cerebellum are within normal limits. Midline structures are within normal limits.  Vascular: No hyperdense vessel or unexpected calcification.  Skull: Calvarium is intact. No focal lytic or blastic lesions are present. No significant extracranial soft tissue lesion is present.  Sinuses/Orbits: Chronic left maxillary sinus opacification is present. The sinus is shrunken. The paranasal sinuses and mastoid air cells are otherwise clear. The globes and orbits are within normal limits.  IMPRESSION: 1. No acute intracranial abnormality. 2. Mild white matter changes are stable. 3. Chronic left maxillary sinus disease.  Electronically Signed   By: Marin Roberts M.D.   On: 12/14/2022 16:20 DG Chest Portable 1 View CLINICAL DATA:  Altered mental status, evaluate for infiltrate.  EXAM: PORTABLE CHEST 1 VIEW  COMPARISON:  Chest radiograph dated December 07, 2022  FINDINGS: The heart size and mediastinal contours are  within normal limits. Low lung volumes without evidence of focal consolidation or pleural effusion. Thoracic spondylosis and postsurgical changes for prior ACDF. No acute osseous abnormality.  IMPRESSION: Low lung volumes without evidence of acute cardiopulmonary process.  Electronically Signed   By: Larose Hires D.O.   On: 12/14/2022 15:50 DG Abdomen 1 View CLINICAL DATA:  Abdominal pain  EXAM: ABDOMEN - 1 VIEW  COMPARISON:  None Available.  FINDINGS: Dilated loops of small bowel. No gas seen in the colon. Prior right total hip replacement. Orthopedic hardware of the lumbar spine.  No acute osseous abnormality.  IMPRESSION: Dilated loops of small bowel concerning for small-bowel obstruction. Abdominal and pelvis CT could be performed for better evaluation.  Electronically Signed   By: Allegra Lai M.D.   On: 12/14/2022 15:21  Lab Results  Component Value Date   WBC 6.3 12/14/2022   HGB 10.8 (L) 12/14/2022   HCT 33.5 (L) 12/14/2022   MCV 91.8 12/14/2022   PLT 224 12/14/2022   Last metabolic panel Lab Results  Component Value Date   GLUCOSE 79 12/14/2022   NA 135 12/14/2022   K 3.0 (L) 12/14/2022   CL 96 (L) 12/14/2022   CO2 29 12/14/2022   BUN 6 12/14/2022   CREATININE 0.46 12/14/2022   GFRNONAA >60 12/14/2022   CALCIUM 7.4 (L) 12/14/2022   PHOS 3.3 12/07/2022   PROT 5.0 (L) 12/14/2022   ALBUMIN 1.8 (L) 12/14/2022   BILITOT 1.2 12/14/2022   ALKPHOS 96 12/14/2022   AST 64 (H) 12/14/2022   ALT 22 12/14/2022   ANIONGAP 10 12/14/2022    Assessment and Plan: * Pancreatitis Noted significant pancreatitis on imaging with several pseudocysts Lipase 53 LFTs reassuring Overall stable clinical exam with fairly benign abdomen as present  BISAP score 1   Unclear if this is an acute versus acute on chronic issue Baseline encephalopathy is a major complicating issues patient is minimally answering questions Case discussed with Dr. Timothy Lasso who will evaluate the  patient in the morning Pain control N.p.o. IV fluids Follow-up  Colitis Noted imaging concerning for colitis in the setting of concomitant pancreatitis with pseudocysts IV Rocephin and Flagyl for infectious coverage Afebrile without leukocytosis at present De-escalate antibiotics as appropriate Unclear if patient has having any active diarrhea in the setting of encephalopathy-not answering questions Nonacute abdomen at present  Stool studies Dr. Timothy Lasso aware of case Follow-up formal gastroenterology recommendations  Acute encephalopathy Patient acutely confused with some intermittent?  Hallucinations Is not answering questions Noted recent admission with encephalopathy in setting of hypoglycemia Blood sugar 79 today EtOH and UDS are pending CT head grossly stable apart from maxillary sinus disease Given presentation, there is some concern for psychiatric component-noted baseline depression on Cymbalta and Wellbutrin Will formally consult behavioral health to assess Noted chronic narcotic use Prudent pain control in the setting of pancreatitis   Chronic prescription opiate use Noted baseline heavy oxycodone use in the setting of chronic back pain Concurrent pancreatitis Prudent pain control in setting of pancreatitis  Hypoglycemia Recent admission for hypoglycemia associated metabolic encephalopathy Blood sugars in 70s today Will place patient on dextrose containing IV fluids in the setting of pancreatitis with current n.p.o. status Follow  Essential hypertension BP stable Titrate home regimen  Hypothyroidism Synthroid once diet gets advanced  Hypokalemia K3.0 Replete Check mag level  Gastroesophageal reflux disease PPI   Resume home meds as diet is advanced    Advance Care Planning:   Code Status: Full Code   Consults: Dr. Timothy Lasso w/ GI, Dr. Toni Amend w/ psych   Family Communication: No family at the bedside   Severity of Illness: The appropriate patient  status for this patient is OBSERVATION. Observation status is judged to be reasonable and necessary in order to provide the required intensity of service to ensure the patient's safety. The patient's presenting symptoms, physical exam findings, and initial radiographic and laboratory data in the context of their medical condition is felt to place them at decreased risk for further clinical deterioration. Furthermore, it is anticipated that the patient will be medically  stable for discharge from the [name] within 2 midnights of admission.   Author: Floydene Flock, MD 12/14/2022 6:22 PM  For on call review [email or web address].

## 2022-12-14 NOTE — Assessment & Plan Note (Signed)
Noted significant pancreatitis on imaging with several pseudocysts Lipase 53 LFTs reassuring Overall stable clinical exam with fairly benign abdomen as present  BISAP score 1   Unclear if this is an acute versus acute on chronic issue Baseline encephalopathy is a major complicating issues patient is minimally answering questions Case discussed with Dr. Timothy Lasso who will evaluate the patient in the morning Pain control N.p.o. IV fluids Follow-up

## 2022-12-14 NOTE — ED Provider Notes (Signed)
[facility] Provider Note    Event Date/Time   First MD Initiated Contact with Patient 12/14/22 1518     (approximate)   History   Failure To Thrive   HPI  Abigail [name] is a 57 y.o. female who presents to the ER with chief complaint of failure to thrive stating that she hurts all over and has had decreased p.o. intake.  Does have documented history of IBS, chronic pain syndrome, depression, anxiety with chronic opioid dependence as well as rheumatoid arthritis.  Patient withdrawn and not providing any additional history as to what brought her to the ER.  States that she cannot eat anything because of abdominal pain.     Physical Exam   Triage Vital Signs: ED Triage Vitals  Enc Vitals Group     BP 12/14/22 1405 116/74     Pulse Rate 12/14/22 1405 94     Resp 12/14/22 1405 20     Temp 12/14/22 1405 98.1 F (36.7 C)     Temp Source 12/14/22 1405 Axillary     SpO2 12/14/22 1405 100 %     Weight --      Height --      Head Circumference --      Peak Flow --      Pain Score 12/14/22 1350 0     Pain Loc --      Pain Edu? --      Excl. in GC? --     Most recent vital signs: Vitals:   12/14/22 1405  BP: 116/74  Pulse: 94  Resp: 20  Temp: 98.1 F (36.7 C)  SpO2: 100%     Constitutional: Alert, chronically ill appearing Eyes: Conjunctivae are normal.  Head: Atraumatic. Nose: No congestion/rhinnorhea. Mouth/Throat: Mucous membranes are moist.   Neck: Painless ROM.  Cardiovascular:   Good peripheral circulation. Respiratory: Normal respiratory effort.  No retractions.  Gastrointestinal: Soft and nontender.  Musculoskeletal:  no deformity Neurologic:  MAE spontaneously. No gross focal neurologic deficits are appreciated.  Skin:  Skin is warm, dry and intact. No rash noted. Psychiatric: Mood and affect are normal. Speech and behavior are normal.    ED Results / Procedures / Treatments   Labs (all labs ordered are listed,  but only abnormal results are displayed) Labs Reviewed  CBC - Abnormal; Notable for the following components:      Result Value   RBC 3.65 (*)    Hemoglobin 10.8 (*)    HCT 33.5 (*)    All other components within normal limits  BASIC METABOLIC PANEL - Abnormal; Notable for the following components:   Potassium 3.0 (*)    Chloride 96 (*)    Calcium 7.4 (*)    All other components within normal limits  HEPATIC FUNCTION PANEL - Abnormal; Notable for the following components:   Total Protein 5.0 (*)    Albumin 1.8 (*)    AST 64 (*)    Indirect Bilirubin 1.0 (*)    All other components within normal limits  LIPASE, BLOOD - Abnormal; Notable for the following components:   Lipase 53 (*)    All other components within normal limits  URINALYSIS, ROUTINE W REFLEX MICROSCOPIC  URINE DRUG SCREEN, QUALITATIVE ([facility] ONLY)  ETHANOL  TROPONIN I (HIGH SENSITIVITY)  TROPONIN I (HIGH SENSITIVITY)     EKG RADIOLOGY Please see ED Course for my review and interpretation.  I personally reviewed all radiographic images ordered to evaluate for the  above acute complaints and reviewed radiology reports and findings.  These findings were personally discussed with the patient.  Please see medical record for radiology report.    PROCEDURES:  Critical Care performed: No  Procedures   MEDICATIONS ORDERED IN ED: Medications  lactated ringers bolus 1,000 mL (has no administration in time range)  sodium chloride 0.9 % bolus 500 mL (has no administration in time range)  iohexol (OMNIPAQUE) 300 MG/ML solution 100 mL (100 mLs Intravenous Contrast Given 12/14/22 1559)     IMPRESSION / MDM / ASSESSMENT AND PLAN / ED COURSE  I reviewed the triage vital signs and the nursing notes.                              Differential diagnosis includes, but is not limited to, Dehydration, sepsis, pna, uti, hypoglycemia, cva, drug effect, withdrawal  Patient presenting to the ER for evaluation of symptoms as  described above.  Based on symptoms, risk factors and considered above differential, this presenting complaint could reflect a potentially life-threatening illness therefore the patient will be placed on continuous pulse oximetry and telemetry for monitoring.  Laboratory evaluation will be sent to evaluate for the above complaints.  KUB ordered out of triage on my review and interpretation with findings concerning for possible bowel obstruction.  Will order CT imaging.     Clinical Course as of 12/14/22 1659  Fri Dec 14, 2022  1645 CT imaging on my review interpretation does not show any evidence of SBO but does have quite a significant amount of pancreatic stranding with pseudocyst formation.  Hemodynamically stable.  Is having copious diarrhea.  No white count but not tolerating p.o.  Patient also somewhat odd and not providing additional history may have some underlying psychiatric undertones contributing to presentation but with her poor p.o. intake and chronic pain will consult hospitalist for admission [PR]    Clinical Course User Index [PR] Willy Eddy, MD     FINAL CLINICAL IMPRESSION(S) / ED DIAGNOSES   Final diagnoses:  Acute pancreatitis, unspecified complication status, unspecified pancreatitis type     Rx / DC Orders   ED Discharge Orders     None        Note:  This document was prepared using Dragon voice recognition software and may include unintentional dictation errors.    Willy Eddy, MD 12/14/22 [phone]

## 2022-12-14 NOTE — ED Triage Notes (Signed)
Pt comes with c/o failure to thrive. Pt comes via EMs. Pt has not been eating or drinking. Pt states full body pains. Pt given 4 zofran and 20 g in lac

## 2022-12-14 NOTE — Assessment & Plan Note (Signed)
BP stable Titrate home regimen 

## 2022-12-14 NOTE — Assessment & Plan Note (Signed)
Recent admission for hypoglycemia associated metabolic encephalopathy Blood sugars in 70s today Will place patient on dextrose containing IV fluids in the setting of pancreatitis with current n.p.o. status Follow

## 2022-12-14 NOTE — Assessment & Plan Note (Signed)
K3.0 Replete Check mag level

## 2022-12-15 DIAGNOSIS — K859 Acute pancreatitis without necrosis or infection, unspecified: Secondary | ICD-10-CM | POA: Diagnosis not present

## 2022-12-15 DIAGNOSIS — K529 Noninfective gastroenteritis and colitis, unspecified: Secondary | ICD-10-CM | POA: Diagnosis not present

## 2022-12-15 DIAGNOSIS — I10 Essential (primary) hypertension: Secondary | ICD-10-CM | POA: Diagnosis not present

## 2022-12-15 LAB — CBC
HCT: 31.5 % — ABNORMAL LOW (ref 36.0–46.0)
Hemoglobin: 10.4 g/dL — ABNORMAL LOW (ref 12.0–15.0)
MCH: 29.9 pg (ref 26.0–34.0)
MCHC: 33 g/dL (ref 30.0–36.0)
MCV: 90.5 fL (ref 80.0–100.0)
Platelets: 200 10*3/uL (ref 150–400)
RBC: 3.48 MIL/uL — ABNORMAL LOW (ref 3.87–5.11)
RDW: 13.5 % (ref 11.5–15.5)
WBC: 7.5 10*3/uL (ref 4.0–10.5)
nRBC: 0 % (ref 0.0–0.2)

## 2022-12-15 LAB — COMPREHENSIVE METABOLIC PANEL
ALT: 22 U/L (ref 0–44)
AST: 53 U/L — ABNORMAL HIGH (ref 15–41)
Albumin: 1.9 g/dL — ABNORMAL LOW (ref 3.5–5.0)
Alkaline Phosphatase: 102 U/L (ref 38–126)
Anion gap: 12 (ref 5–15)
BUN: <5 mg/dL — ABNORMAL LOW (ref 6–20)
CO2: 27 mmol/L (ref 22–32)
Calcium: 7.7 mg/dL — ABNORMAL LOW (ref 8.9–10.3)
Chloride: 98 mmol/L (ref 98–111)
Creatinine, Ser: 0.45 mg/dL (ref 0.44–1.00)
GFR, Estimated: >60 mL/min (ref >60)
Glucose, Bld: 75 mg/dL (ref 70–99)
Potassium: 2.9 mmol/L — ABNORMAL LOW (ref 3.5–5.1)
Sodium: 137 mmol/L (ref 135–145)
Total Bilirubin: 1.1 mg/dL (ref 0.3–1.2)
Total Protein: 4.9 g/dL — ABNORMAL LOW (ref 6.5–8.1)

## 2022-12-15 LAB — GLUCOSE, CAPILLARY
Glucose-Capillary: 72 mg/dL (ref 70–99)
Glucose-Capillary: 83 mg/dL (ref 70–99)
Glucose-Capillary: 85 mg/dL (ref 70–99)
Glucose-Capillary: 92 mg/dL (ref 70–99)
Glucose-Capillary: 98 mg/dL (ref 70–99)

## 2022-12-15 MED ORDER — POTASSIUM CHLORIDE 10 MEQ/100ML IV SOLN
10.0000 meq | INTRAVENOUS | Status: AC
  Administered 2022-12-15 (×5): 10 meq via INTRAVENOUS
  Filled 2022-12-15 (×4): qty 100

## 2022-12-15 MED ORDER — CLONAZEPAM 0.5 MG PO TBDP
0.5000 mg | ORAL_TABLET | Freq: Two times a day (BID) | ORAL | Status: DC | PRN
  Administered 2022-12-16: 0.5 mg via ORAL
  Filled 2022-12-15: qty 1

## 2022-12-15 MED ORDER — POTASSIUM CHLORIDE 10 MEQ/100ML IV SOLN
10.0000 meq | INTRAVENOUS | Status: AC
  Administered 2022-12-15 (×4): 10 meq via INTRAVENOUS
  Filled 2022-12-15 (×2): qty 100

## 2022-12-15 NOTE — Consult Note (Signed)
Inpatient Consultation   Patient ID: Abigail [name] is a 57 y.o. female.  Requesting Provider: Doree Albee, MD  Date of Admission: 12/14/2022  Date of Consult: 12/15/22   Reason for Consultation: Pancreatitis with pseudocyst formation   Patient's Chief Complaint:   Chief Complaint  Patient presents with   Failure To Thrive    57 year old Caucasian female with history of pancreatitis, chronic diarrhea, GCA, RA, fibromyalgia, chronic pain on chronic opioids, IBS who presents to the hospital with altered mentation and was found to have pancreatitis on imaging along with possible colitis.  At time of evaluation the patient is nonparticipatory in exam but awakes to voice easily.  Information is garnered via chart review and discussion with nursing.  Patient was seen by [facility] in [facility] for acute on chronic pancreatitis.  At that point in time fluid collections were noted with her acute episode.  This appears to have transition to pseudocysts on imaging during this hospitalization.  She also has peripancreatic fat stranding concerning for recurrent acute on chronic pancreatitis.  Lipase is not elevated.  Hemoglobin 10.4.  LFTs within normal limits and no hepatobiliary findings.  [facility] had recommended and plan for MRI MRCP to further evaluate these areas to see if endoscopic ultrasound with intervention was indicated.  Per the records she continue to use tobacco products and there is a question of alcohol use.  Appears to be malnourished based on labs with an albumin of 1.9 and multiple electrolyte abnormalities.  CT also demonstrated fatty liver disease as well as some ascending colon and small bowel wall thickening.  This may be secondary to inflammatory response in the setting of pancreatitis.  She did not receive p.o. contrast, so unclear if any obstructive features.  Unclear if the patient has any abdominal pain as she does not answer when asked and has no reaction on  palpation  She did receive a dose of Rocephin and Flagyl in the emergency department.  Vital signs are normal.  Patient lying in bed with brown stool on the sheets.  No melena or hematochezia  No NSAIDs, Anti-plt agents, and anticoagulants per record No family history of gastrointestinal disease and malignancy per record Previous Endoscopies: 12/2016- EGD and Colonoscopy  EGD with H. pylori negative gastritis, 1 hyperplastic polyp on colonoscopy negative for colitis on random biopsies     Past Medical History:  Diagnosis Date   Anxiety    Arthritis    Back pain    Basal cell carcinoma 10/04/2021   R axilla - needs excised 11/28/21   Basal cell carcinoma 10/04/2021   L antecubital excised 11/14/21   Diarrhea 11/12/2016   Fibromyalgia    Generalized abdominal pain 11/12/2016   H. pylori infection    Hyperlipidemia    IBS (irritable bowel syndrome)    Infectious colitis 04/29/2016   Migraines    Moderate dehydration 04/29/2016   Muscle pain    Reflux    Unexplained weight loss 11/12/2016    Past Surgical History:  Procedure Laterality Date   ABDOMINAL HYSTERECTOMY     APPENDECTOMY  2009   C5 FUSION     C6 FUSION     C7 FUSION     COLONOSCOPY  02/2006   COLONOSCOPY WITH PROPOFOL N/A 12/25/2016   Procedure: COLONOSCOPY WITH PROPOFOL;  Surgeon: Earline Mayotte, MD;  Location: [facility] ENDOSCOPY;  Service: Endoscopy;  Laterality: N/A;   ESOPHAGOGASTRODUODENOSCOPY (EGD) WITH PROPOFOL N/A 12/25/2016   Procedure: ESOPHAGOGASTRODUODENOSCOPY (EGD) WITH PROPOFOL;  Surgeon: Earline Mayotte, MD;  Location: [facility] ENDOSCOPY;  Service: Endoscopy;  Laterality: N/A;   FOOT SURGERY     HIP ARTHROPLASTY     L4 FUSION     L5 FUSION     S1 FUSION      Allergies  Allergen Reactions   Nsaids Hives   Tapentadol Other (See Comments), Rash and Swelling    Made her deathly sick   Codeine    Darvocet [Propoxyphene N-Acetaminophen]    Sulfa Antibiotics    Tape     No family history on  file.  Social History   Tobacco Use   Smoking status: Every Day    Packs/day: 1    Types: Cigarettes   Smokeless tobacco: Never   Tobacco comments:    ELECTRONIC VAPOR  Substance Use Topics   Alcohol use: Yes    Comment: OCCASIONALLY   Drug use: No     Pertinent GI related history and allergies were reviewed  Review of Systems  Unable to perform ROS: Mental status change (patient does not interact with provider)     Medications Home Medications No current facility-administered medications on file prior to encounter.   Current Outpatient Medications on File Prior to Encounter  Medication Sig Dispense Refill   albuterol (VENTOLIN HFA) 108 (90 Base) MCG/ACT inhaler Inhale 1-2 puffs into the lungs every 6 (six) hours as needed for wheezing or shortness of breath.     Cholecalciferol (VITAMIN D3) 10 MCG (400 UNIT) tablet Take 400 Units by mouth daily.     DULoxetine (CYMBALTA) 60 MG capsule Take 60 mg by mouth 2 (two) times daily.     gabapentin (NEURONTIN) 300 MG capsule Take 300-600 mg by mouth 3 (three) times daily.     Oxycodone HCl 20 MG TABS Take 0.5 tablets by mouth every 6 (six) hours as needed.     pantoprazole (PROTONIX) 40 MG tablet Take 40 mg by mouth daily.     potassium chloride (KLOR-CON) 10 MEQ tablet Take 10 mEq by mouth daily.     promethazine (PHENERGAN) 25 MG tablet Take 1 tablet (25 mg total) by mouth every 8 (eight) hours as needed for nausea or vomiting. 20 tablet 0   thiamine (VITAMIN B1) 100 MG tablet Take 1 tablet by mouth daily.     atorvastatin (LIPITOR) 40 MG tablet  (Patient not taking: Reported on 12/08/2022)     buPROPion (WELLBUTRIN XL) 150 MG 24 hr tablet Take 150 mg by mouth daily.     folic acid (FOLVITE) 1 MG tablet Take 1 mg by mouth daily. (Patient not taking: Reported on 12/08/2022)     hydrochlorothiazide (HYDRODIURIL) 12.5 MG tablet Take 12.5 mg by mouth daily. (Patient not taking: Reported on 12/14/2022)     levothyroxine (SYNTHROID,  LEVOTHROID) 75 MCG tablet Take 75 mcg by mouth daily before breakfast. (Patient not taking: Reported on 12/08/2022)     lidocaine (LIDODERM) 5 % Place 1 patch onto the skin daily. Remove & Discard patch within 12 hours or as directed by MD 30 patch 0   meclizine (ANTIVERT) 25 MG tablet Take 1 tablet (25 mg total) by mouth 3 (three) times daily as needed for dizziness. (Patient not taking: Reported on 12/08/2022) 90 tablet 1   methotrexate (RHEUMATREX) 2.5 MG tablet Take 7.5 mg by mouth 3 (three) times a week. (Patient not taking: Reported on 12/08/2022)     mupirocin ointment (BACTROBAN) 2 % Apply 1 application  topically daily. Apply to scratches and  bump at nose until healed. 22 g 0   polyethylene glycol powder (GLYCOLAX/MIRALAX) 17 GM/SCOOP powder Take 17 g by mouth 2 (two) times daily as needed. 3350 g 1   Pertinent GI related medications were reviewed  Inpatient Medications  Current Facility-Administered Medications:    cefTRIAXone (ROCEPHIN) 2 g in sodium chloride 0.9 % 100 mL IVPB, 2 g, Intravenous, Q24H, Alvester Morin, Francoise Schaumann, MD   dextrose 5 % and 0.45 % NaCl with KCl 20 mEq/L infusion, , Intravenous, Continuous, Floydene Flock, MD, Last Rate: 125 mL/hr at 12/14/22 2155, New Bag at 12/14/22 2155   enoxaparin (LOVENOX) injection 40 mg, 40 mg, Subcutaneous, Q24H, Floydene Flock, MD, 40 mg at 12/14/22 2159   HYDROmorphone (DILAUDID) injection 0.5 mg, 0.5 mg, Intravenous, Q4H PRN, Floydene Flock, MD, 0.5 mg at 12/15/22 0319   metroNIDAZOLE (FLAGYL) IVPB 500 mg, 500 mg, Intravenous, Q12H, Floydene Flock, MD   ondansetron [facility]) tablet 4 mg, 4 mg, Oral, Q6H PRN **OR** ondansetron (ZOFRAN) injection 4 mg, 4 mg, Intravenous, Q6H PRN, Floydene Flock, MD   potassium chloride 10 mEq in 100 mL IVPB, 10 mEq, Intravenous, Q1 Hr x 5, Floydene Flock, MD, Last Rate: 100 mL/hr at 12/15/22 0752, 10 mEq at 12/15/22 0752   sodium chloride 0.9 % bolus 500 mL, 500 mL, Intravenous, Once, Willy Eddy,  MD  cefTRIAXone (ROCEPHIN)  IV     dextrose 5 % and 0.45 % NaCl with KCl 20 mEq/L 125 mL/hr at 12/14/22 2155   metronidazole     potassium chloride 10 mEq (12/15/22 0752)   sodium chloride      HYDROmorphone (DILAUDID) injection, ondansetron **OR** ondansetron (ZOFRAN) IV   Objective   Vitals:   12/14/22 1700 12/14/22 1809 12/14/22 2053 12/15/22 0803  BP: (!) 156/81  (!) 147/134 125/64  Pulse: 77 79 77 85  Resp: (!) 22 20 16 16   Temp:  (!) 97.5 F (36.4 C) 98.7 F (37.1 C) 97.7 F (36.5 C)  TempSrc:   Oral   SpO2: 100% 100% 96% 100%     Physical Exam Vitals and nursing note reviewed.  Constitutional:      General: She is not in acute distress.    Appearance: She is ill-appearing. She is not toxic-appearing or diaphoretic.     Comments: Easily arousable to voice  HENT:     Head: Normocephalic and atraumatic.     Nose: Nose normal.  Eyes:     Comments: Opens eyes to voice  Cardiovascular:     Rate and Rhythm: Normal rate and regular rhythm.  Pulmonary:     Effort: Pulmonary effort is normal. No respiratory distress.     Breath sounds: Normal breath sounds. No wheezing.  Abdominal:     General: There is no distension.     Palpations: Abdomen is soft.     Tenderness: There is no abdominal tenderness. There is no guarding or rebound.  Musculoskeletal:     Cervical back: Neck supple.     Right lower leg: No edema.     Left lower leg: No edema.     Comments: Scabs and bruising over lower extremities. Scar from R hip replacement.  Skin:    General: Skin is warm and dry.     Coloration: Skin is not jaundiced or pale.  Neurological:     Comments: Non interactive. Easily wakes to voice- does not participate in exam or answer questions  Psychiatric:     Comments: Encephalopathic vs  non participatory in exam Stool noted on bed- patient rolled away from this- not completely lying in stool Corky Crafts is wet/soaked     Laboratory Data Recent Labs  Lab 12/10/22 0507  12/14/22 1400 12/15/22 0424  WBC 8.8 6.3 7.5  HGB 12.2 10.8* 10.4*  HCT 39.4 33.5* 31.5*  PLT 191 224 200   Recent Labs  Lab 12/14/22 1400 12/14/22 1422 12/14/22 2236 12/15/22 0424  NA 135  --  136 137  K 3.0*  --  2.7* 2.9*  CL 96*  --  97* 98  CO2 29  --  27 27  BUN 6  --  <5* <5*  CALCIUM 7.4*  --  7.4* 7.7*  PROT  --  5.0*  --  4.9*  BILITOT  --  1.2  --  1.1  ALKPHOS  --  96  --  102  ALT  --  22  --  22  AST  --  64*  --  53*  GLUCOSE 79  --  76 75   No results for input(s): "INR" in the last 168 hours.  Recent Labs    12/14/22 1422  LIPASE 53*        Imaging Studies: CT ABDOMEN PELVIS W CONTRAST  Result Date: 12/14/2022 CLINICAL DATA:  Failure to thrive.  Pain. EXAM: CT ABDOMEN AND PELVIS WITH CONTRAST TECHNIQUE: Multidetector CT imaging of the abdomen and pelvis was performed using the standard protocol following bolus administration of intravenous contrast. RADIATION DOSE REDUCTION: This exam was performed according to the departmental dose-optimization program which includes automated exposure control, adjustment of the mA and/or kV according to patient size and/or use of iterative reconstruction technique. CONTRAST:  OMNIPAQUE IOHEXOL 300 MG/ML  SOLN COMPARISON:  CT 05/24/2014.  X-ray 12/14/2022 FINDINGS: Lower chest: Mild linear opacity lung bases likely scar or atelectasis. Trace pleural fluid. Significant breathing motion at the lung bases. Hepatobiliary: Diffuse fatty liver infiltration. Areas of sparing along the gallbladder fossa. Patent portal vein. Gallbladder is nondilated. Pancreas: There is peripancreatic fat stranding. Several cystic areas are identified involving the pancreas itself in the surrounding tissues. These include of the pancreatic head involving the pancreatic groove for example on series 2, image 34 measuring 2.5 by 1.9 cm. Focus more superior on series 2, image 29 measures 19 by 15 mm. Several smaller foci in the midbody of the  pancreas on image 27 of series 2, image 24 of series 2. There is also some loculated fluid collections abutting the tail of the pancreas of the greater curve of the stomach with the adjacent wall thickening of the stomach. Example on series 2, image 19 measures 3.3 x 1.9 cm. Based on overall appearance these could be the sequela of pancreatitis with pseudocyst formation. Please correlate with specific clinical history. Spleen: Normal in size without focal abnormality. Small splenules are noted. Adrenals/Urinary Tract: The adrenal glands are unremarkable. Kidneys are without enhancing mass or collecting system dilatation. The ureters have normal course and caliber down to the bladder which has a preserved contour. Stomach/Bowel: Large bowel is nondilated but has diffuse wall thickening greatest involving the ascending colon. Please correlate for colitis. Surgical changes along the base of the cecum in the appendix is not seen. The stomach is mildly distended with fluid. There is wall thickening along the second portion of the duodenum. There are some mildly distended proximal jejunal loops with mild wall thickening. Ileum is decompressed. Vascular/Lymphatic: Normal caliber aorta and IVC with scattered vascular calcifications. Calcifications are  also seen along the branch vessels. No specific abnormal lymph node enlargement identified in the pelvis. There are some enlarged nodes in the porta hepatis. Example on series 2, image 27 measuring 2.1 x 1.2 cm. Focus portacaval series 2, image 24 measures 19 by 11 mm. Other porta hepatic nodes as well such as image 24 of series 2. Anterior to the portal vein. Reproductive: Uterus and bilateral adnexa are unremarkable. Other: Anasarca. Mesenteric stranding. No free air. No frank ascites. Musculoskeletal: Curvature of the spine. Moderate degenerative changes of the spine and pelvis. There is significant streak artifact related to the right hip arthroplasty in the lower lumbar  spine fixation hardware. There is moderate compression as well of the L1 level which is new from 2015 but has sclerotic margins. This could be more chronic there is also significant compression along the lower thoracic spine at T9, T8 and T7. Based on appearance this could be more subacute but age-indeterminate. Please correlate for any known history or dedicated evaluation when appropriate IMPRESSION: Significant stranding along the pancreas with several loculated fluid collections identified involving the pancreas well as adjacent tissues. This includes the pancreatic groove as well as tail of the pancreas of the against the greater curve of the stomach. Based on the overall appearance this could be related to evolution of the pancreatitis and pseudocyst formation. Please correlate with specific clinical presentation. Wall thickening along the colon diffusely greatest along the right side with some stranding. Please correlate for colitis is also some mild distention with some fold thickening along loops of jejunum. Fatty liver infiltration. Several enlarged lymph nodes in the porta hepatis right upper quadrant. This has a broad differential with the other findings and recommend attention on short follow-up Trace pleural fluid. Multilevel compression deformities along the spine. Those involving the mid to lower thoracic spine may be more subacute but are age-indeterminate and recommend further evaluation if there is no known history. Electronically Signed   By: Karen Kays M.D.   On: 12/14/2022 16:27   CT HEAD WO CONTRAST ( )  Result Date: 12/14/2022 CLINICAL DATA:  Mental status change, cause.  Failure to thrive. EXAM: CT HEAD WITHOUT CONTRAST TECHNIQUE: Contiguous axial images were obtained from the base of the skull through the vertex without intravenous contrast. RADIATION DOSE REDUCTION: This exam was performed according to the departmental dose-optimization program which includes automated exposure  control, adjustment of the mA and/or kV according to patient size and/or use of iterative reconstruction technique. COMPARISON:  CT head without contrast 4/19/4. FINDINGS: Brain: No acute infarct, hemorrhage, or mass lesion is present. Mild white matter changes in the anterior limb of the internal capsule bilaterally are stable. Basal ganglia are intact. Insular ribbon is bilaterally. The ventricles are of normal size. No significant extraaxial fluid collection is present. The brainstem and cerebellum are within normal limits. Midline structures are within normal limits. Vascular: No hyperdense vessel or unexpected calcification. Skull: Calvarium is intact. No focal lytic or blastic lesions are present. No significant extracranial soft tissue lesion is present. Sinuses/Orbits: Chronic left maxillary sinus opacification is present. The sinus is shrunken. The paranasal sinuses and mastoid air cells are otherwise clear. The globes and orbits are within normal limits. IMPRESSION: 1. No acute intracranial abnormality. 2. Mild white matter changes are stable. 3. Chronic left maxillary sinus disease. Electronically Signed   By: Marin Roberts M.D.   On: 12/14/2022 16:20   DG Chest Portable 1 View  Result Date: 12/14/2022 CLINICAL DATA:  Altered mental status,  evaluate for infiltrate. EXAM: PORTABLE CHEST 1 VIEW COMPARISON:  Chest radiograph dated December 07, 2022 FINDINGS: The heart size and mediastinal contours are within normal limits. Low lung volumes without evidence of focal consolidation or pleural effusion. Thoracic spondylosis and postsurgical changes for prior ACDF. No acute osseous abnormality. IMPRESSION: Low lung volumes without evidence of acute cardiopulmonary process. Electronically Signed   By: Larose Hires D.O.   On: 12/14/2022 15:50   DG Abdomen 1 View  Result Date: 12/14/2022 CLINICAL DATA:  Abdominal pain EXAM: ABDOMEN - 1 VIEW COMPARISON:  None Available. FINDINGS: Dilated loops of small  bowel. No gas seen in the colon. Prior right total hip replacement. Orthopedic hardware of the lumbar spine. No acute osseous abnormality. IMPRESSION: Dilated loops of small bowel concerning for small-bowel obstruction. Abdominal and pelvis CT could be performed for better evaluation. Electronically Signed   By: Allegra Lai M.D.   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  Radiology has recommended repeat CT with p.o. contrast -Patient currently afebrile with seemingly no abdominal pain.  Lipase within normal limits.  CT does demonstrate peripancreatic fat stranding  #Colitis reported on imaging-seemingly asymptomatic -History of opioid-induced constipation  # Acute encephalopathy -History of opioid dependence with overdose  # Giant cell arteritis and rheumatoid arthritis  #Fatty liver disease  #Multiple electrolyte abnormalities  #Chronic anemia normocytic  Plan:  Will conduct CT with p.o. contrast to assess for obstructive features If this is within normal limits, recommend supportive care with IV fluids and pain control Would be judicious with pain control given the patient's encephalopathy-unclear if this is true encephalopathy versus psychiatric Other areas of inflammation may be sequelae of her pancreatitis and inflammatory response.  Unknown if the patient is having diarrhea but infectious stool studies are pending. She has a history of chronic diarrhea has undergone colonoscopy in the past negative for colitis Can consider outpatient repeat colonoscopy  Her primary gastroenterologist had asked for MRI MRCP as an outpatient.  Given this is within a different hospital system, likely will defer to primary gastroenterologist and completing this within their  system for  follow-up Complete alcohol and tobacco cessation recommended  Symptomatic control including pain relief and antiemetics as per primary team Electrolyte correction as per primary team and pharmacy  Follow-up with primary gastroenterologist at [facility]  I personally performed the service.  Management of other medical comorbidities as per primary team  Thank you for allowing [place] to participate in this patient's care. Please don't hesitate to call if any questions or concerns arise.   Jaynie Collins, DO [facility]  Portions of the record may have been created with voice recognition software. Occasional wrong-word or 'sound-a-like' substitutions may have occurred due to the inherent limitations of voice recognition software.  Read the chart carefully and recognize, using context, where substitutions may have occurred.

## 2022-12-15 NOTE — Progress Notes (Signed)
Progress Note   Patient: Abigail [name] [facility] ZOX:[id number] DOB: 05/28/1966 DOA: 12/14/2022     1 DOS: the patient was seen and examined on 12/15/2022   Brief hospital course:   57 y.o. female with medical history significant of anxiety, fibromyalgia, chronic pain, IBS, colitis presenting with pancreatitis, colitis, encephalopathy, hypokalemia.  Limited history in the setting of encephalopathy.  Patient not answering many questions with some intermittent?  Hallucinations.  Per report, decreased p.o. intake in the setting of abdominal pain.  Patient does not specify abdominal pain.  No reported chest pain, shortness of breath, nausea or vomiting.  No reported diarrhea.  Patient does not answer if she uses any alcohol or illicit drugs.  Noted recent admission April 19 through April 22 for metabolic encephalopathy in setting of hypoglycemia. Presented to the ER afebrile, hemodynamically stable.  Satting well on room air.  White count 6.3, hemoglobin 10.8, creatinine 0.46, potassium 3.0, albumin 1.8, AST 64, ALT 22.  T. bili 1.2.  Lipase 53.  CT abdomen pelvis with pancreatitis with multiple pseudocyst formation as well as right-sided colitis.  4/26 : GI evaluated the patient, as patient has pseudocyst on CT finding was to be reevaluated with repeat CT however patient refused p.o. contrast.  In the meantime manage the patient conservatively with pain medication avoiding over use as patient is encephalopathic.   Assessment and Plan: * Pancreatitis  Noted significant pancreatitis on imaging with several pseudocysts Lipase 53 LFTs reassuring Overall stable clinical exam with fairly benign abdomen as present  BISAP score 1   Unclear if this is an acute versus acute on chronic issue Baseline encephalopathy is a major complicating issues patient is minimally answering questions Case discussed with Dr. Timothy Lasso who will evaluate the patient in the morning Pain control N.p.o. IV  fluids Follow-up  Colitis  Noted imaging concerning for colitis in the setting of concomitant pancreatitis with pseudocysts IV Rocephin and Flagyl for infectious coverage Afebrile without leukocytosis at present De-escalate antibiotics as appropriate Unclear if patient has having any active diarrhea in the setting of encephalopathy-not answering questions Nonacute abdomen at present  Stool studies Dr. Timothy Lasso aware of case Follow-up formal gastroenterology recommendations  Acute encephalopathy  Patient acutely confused with some intermittent?  Hallucinations Is not answering questions Noted recent admission with encephalopathy in setting of hypoglycemia Blood sugar 79 today EtOH and UDS are pending CT head grossly stable apart from maxillary sinus disease Given presentation, there is some concern for psychiatric component-noted baseline depression on Cymbalta and Wellbutrin Will formally consult behavioral health to assess Noted chronic narcotic use Prudent pain control in the setting of pancreatitis  Chronic prescription opiate use Noted baseline heavy oxycodone use in the setting of chronic back pain Concurrent pancreatitis Prudent pain control in setting of pancreatitis  Hypoglycemia Recent admission for hypoglycemia associated metabolic encephalopathy Blood sugars in 70s today Will place patient on dextrose containing IV fluids in the setting of pancreatitis with current n.p.o. status Follow  Essential hypertension BP stable Titrate home regimen  Hypothyroidism Synthroid once diet gets advanced  Hypokalemia K3.0 Replete Check mag level  Gastroesophageal reflux disease PPI      Subjective: Patient seen and examined during morning, no overnight events.  Not participating in history taking.  Vital labs and imaging reviewed.  Physical Exam: Vitals:   12/14/22 1809 12/14/22 2053 12/15/22 0803 12/15/22 1220  BP:  (!) 147/134 125/64 (!) 154/93  Pulse: 79 77  85 92  Resp: 20 16 16 16   Temp: Marland Kitchen)  97.5 F (36.4 C) 98.7 F (37.1 C) 97.7 F (36.5 C) 98.7 F (37.1 C)  TempSrc:  Oral    SpO2: 100% 96% 100% 100%   Physical Exam Constitutional:      General: She is not in acute distress.    Comments: Somnolent  HENT:     Head: Normocephalic and atraumatic.  Eyes:     Extraocular Movements: Extraocular movements intact.  Cardiovascular:     Rate and Rhythm: Normal rate.  Pulmonary:     Effort: Pulmonary effort is normal.  Abdominal:     Comments: Obese abdomen  Musculoskeletal:        General: Normal range of motion.  Neurological:     Comments: Moving all extremities facial symmetry preserved  Psychiatric:     Comments: Meuth not answering     Data Reviewed:  There are no new results to review at this time.  Family Communication: None by bedside   Disposition: Status is: Inpatient Remains inpatient appropriate because: Acute pancreatitis   Planned Discharge Destination: Home    Time spent: 31 minutes  Author: Kirstie Peri, MD 12/15/2022 1:56 PM  For on call review [email or web address].

## 2022-12-15 NOTE — Consult Note (Signed)
This provider went to assess the client who was restless in bed and yelling, "Oh my God, oh my God!" While grasping the handrails.  Unable to get any other verbal interaction.  Notified her nurse of the apparent pain and need for pain medication.  The nurse reported that she has been acting this way but not specifically identifying pain.  Recommend treating the pain for noted pancreatitis.  Klonopin PRN placed for anxiety.  She should clear with medical treatment of her "pancreatitis with multiple pseudocyst formation as well as right-sided colitis ".  Called a left a message with her mother who is her primary contact to return the call.  Nanine Means, PMHNP

## 2022-12-16 DIAGNOSIS — K859 Acute pancreatitis without necrosis or infection, unspecified: Secondary | ICD-10-CM | POA: Diagnosis not present

## 2022-12-16 DIAGNOSIS — K529 Noninfective gastroenteritis and colitis, unspecified: Secondary | ICD-10-CM | POA: Diagnosis not present

## 2022-12-16 DIAGNOSIS — G9340 Encephalopathy, unspecified: Secondary | ICD-10-CM | POA: Diagnosis not present

## 2022-12-16 LAB — GLUCOSE, CAPILLARY
Glucose-Capillary: 93 mg/dL (ref 70–99)
Glucose-Capillary: 70 mg/dL (ref 70–99)
Glucose-Capillary: 81 mg/dL (ref 70–99)
Glucose-Capillary: 82 mg/dL (ref 70–99)

## 2022-12-16 LAB — COMPREHENSIVE METABOLIC PANEL
ALT: 20 U/L (ref 0–44)
AST: 37 U/L (ref 15–41)
Albumin: 2.1 g/dL — ABNORMAL LOW (ref 3.5–5.0)
Alkaline Phosphatase: 104 U/L (ref 38–126)
Anion gap: 10 (ref 5–15)
BUN: <5 mg/dL — ABNORMAL LOW (ref 6–20)
CO2: 29 mmol/L (ref 22–32)
Calcium: 7.9 mg/dL — ABNORMAL LOW (ref 8.9–10.3)
Chloride: 96 mmol/L — ABNORMAL LOW (ref 98–111)
Creatinine, Ser: 0.52 mg/dL (ref 0.44–1.00)
GFR, Estimated: >60 mL/min (ref >60)
Glucose, Bld: 88 mg/dL (ref 70–99)
Potassium: 3.1 mmol/L — ABNORMAL LOW (ref 3.5–5.1)
Sodium: 135 mmol/L (ref 135–145)
Total Bilirubin: 0.8 mg/dL (ref 0.3–1.2)
Total Protein: 5.5 g/dL — ABNORMAL LOW (ref 6.5–8.1)

## 2022-12-16 LAB — CBC
HCT: 33.4 % — ABNORMAL LOW (ref 36.0–46.0)
Hemoglobin: 10.9 g/dL — ABNORMAL LOW (ref 12.0–15.0)
MCH: 29.6 pg (ref 26.0–34.0)
MCHC: 32.6 g/dL (ref 30.0–36.0)
MCV: 90.8 fL (ref 80.0–100.0)
Platelets: 210 10*3/uL (ref 150–400)
RBC: 3.68 MIL/uL — ABNORMAL LOW (ref 3.87–5.11)
RDW: 13.7 % (ref 11.5–15.5)
WBC: 8.5 10*3/uL (ref 4.0–10.5)
nRBC: 0 % (ref 0.0–0.2)

## 2022-12-16 LAB — MAGNESIUM: Magnesium: 1.8 mg/dL (ref 1.7–2.4)

## 2022-12-16 MED ORDER — MORPHINE SULFATE (CONCENTRATE) 10 MG/0.5ML PO SOLN
2.0000 mg | ORAL | Status: DC | PRN
  Administered 2022-12-16 (×2): 2 mg via SUBLINGUAL
  Filled 2022-12-16 (×2): qty 0.5

## 2022-12-16 NOTE — Progress Notes (Signed)
Progress Note   Patient: Abigail Saintil [facility] VOZ:[id number] DOB: [birth year] DOA: 12/14/2022     2 DOS: the patient was seen and examined on 12/16/2022   Brief hospital course:   57 y.o. female with medical history significant of anxiety, fibromyalgia, chronic pain, IBS, colitis presenting with pancreatitis, colitis, encephalopathy, hypokalemia.  Limited history in the setting of encephalopathy.  Patient not answering many questions with some intermittent?  Hallucinations.  Per report, decreased p.o. intake in the setting of abdominal pain.  Patient does not specify abdominal pain.  No reported chest pain, shortness of breath, nausea or vomiting.  No reported diarrhea.  Patient does not answer if she uses any alcohol or illicit drugs.  Noted recent admission April 19 through April 22 for metabolic encephalopathy in setting of hypoglycemia. Presented to the ER afebrile, hemodynamically stable.  Satting well on room air.  White count 6.3, hemoglobin 10.8, creatinine 0.46, potassium 3.0, albumin 1.8, AST 64, ALT 22.  T. bili 1.2.  Lipase 53.  CT abdomen pelvis with pancreatitis with multiple pseudocyst formation as well as right-sided colitis.  4/27 : GI evaluated the patient, as patient has pseudocyst on CT finding was to be reevaluated with repeat CT however patient refused p.o. contrast.  In the meantime manage the patient conservatively with pain medication avoiding over use as patient is encephalopathic.  4/28 : Patient continues to stay encephalopathic, moaning responding intermittently to verbal stimuli.  Patient refusing p.o.  Currently being managed for colitis and pancreatitis.  Pancreatitis likely resolved at this time.  Patient is pending CT eval to rule out SBO.  She received  p.o. contrast, GI was following with no recommendations has signed off today.  If patient stays asymptomatic would start planning for discharge.  Try to reach son and daughter with no response.  Assessment and  Plan:  Pancreatitis  Noted significant pancreatitis on imaging with several pseudocysts Lipase 53 LFTs reassuring Overall stable clinical exam with fairly benign abdomen as present  BISAP score 1   Unclear if this is an acute versus acute on chronic issue Baseline encephalopathy is a major complicating issues patient is minimally answering questions Case discussed with Dr. Timothy Lasso who will evaluate the patient in the morning Pain control N.p.o. IV fluids Follow-up  Colitis  Noted imaging concerning for colitis in the setting of concomitant pancreatitis with pseudocysts IV Rocephin and Flagyl for infectious coverage Afebrile without leukocytosis at present De-escalate antibiotics as appropriate Unclear if patient has having any active diarrhea in the setting of encephalopathy-not answering questions Nonacute abdomen at present  Stool studies Dr. Timothy Lasso aware of case Follow-up formal gastroenterology recommendations  Acute encephalopathy  Patient acutely confused with some intermittent?  Hallucinations Is not answering questions Noted recent admission with encephalopathy in setting of hypoglycemia Blood sugar 79 today EtOH and UDS -positive for TCA  CT head grossly stable apart from maxillary sinus disease Given presentation, there is some concern for psychiatric component-noted baseline depression on Cymbalta and Wellbutrin Pshyc is involved -recommend Klonopin PRN  Noted chronic narcotic use Prudent pain control in the setting of pancreatitis  Chronic prescription opiate use Noted baseline heavy oxycodone use in the setting of chronic back pain Concurrent pancreatitis Prudent pain control in setting of pancreatitis  Hypoglycemia Recent admission for hypoglycemia associated metabolic encephalopathy Blood sugars in 70s today Will place patient on dextrose containing IV fluids in the setting of pancreatitis with current n.p.o. status Follow  Essential hypertension BP  stable Titrate home regimen  Hypothyroidism  Synthroid once diet gets advanced  Hypokalemia K3.0 Replete Check mag level  Gastroesophageal reflux disease PPI      Subjective: Patient seen and examined during morning, no overnight events.  Not participating in history taking.  Vital labs and imaging reviewed.  Physical Exam: Vitals:   12/15/22 2030 12/15/22 2337 12/16/22 0443 12/16/22 0908  BP: (!) 147/94 123/60 135/81 137/78  Pulse: 95 72 98 87  Resp: 18 16 16 18   Temp: 98.5 F (36.9 C) 98.7 F (37.1 C) 98.1 F (36.7 C) 98.4 F (36.9 C)  TempSrc: Oral Oral Oral   SpO2: 100% 100% 98% 99%   Physical Exam Constitutional:      General: She is not in acute distress.    Comments: Somnolent, moaning and crying not answering.  HENT:     Head: Normocephalic and atraumatic.  Eyes:     Extraocular Movements: Extraocular movements intact.     Pupils: Pupils are equal, round, and reactive to light.  Cardiovascular:     Rate and Rhythm: Normal rate.  Pulmonary:     Effort: Pulmonary effort is normal.  Abdominal:     Comments: Obese abdomen  Musculoskeletal:        General: Normal range of motion.  Neurological:     Comments: Moving all extremities facial symmetry preserved  Psychiatric:     Comments: Meuth not answering     Data Reviewed:  There are no new results to review at this time.  Family Communication: None by bedside   Disposition: Status is: Inpatient Remains inpatient appropriate because: Acute pancreatitis   Planned Discharge Destination: Home    Time spent: 31 minutes  Author: Kirstie Peri, MD 12/16/2022 10:51 AM  For on call review [email or web address].

## 2022-12-16 NOTE — Progress Notes (Signed)
Pt lying in bed fetal position crying "Oh God Help". Will not respond to assessment questions however when asked if in pain pt yells out "oh god yes". Unable to verbalize where pain was. But shook head yes when hand placed on her abdomen and asked if pain was here. Painad score of 8. PRN dilaudid provided.

## 2022-12-16 NOTE — Consult Note (Addendum)
The client is sleeping soundly after receiving pain medications.  This provider did not awaken her for a psych assessment as pain has been a major issue for her.  Psych will continue to follow and assess when her pain is controlled and she can engage in a conversation, which has not been the case this weekend for this provider or nursing.  Klonopin BID PRN in place for anxiety.  Nanine Means, PMHNP

## 2022-12-16 NOTE — Progress Notes (Addendum)
Loss IV access. IV team unable to regain access. MD, Hilda Blades, made aware.

## 2022-12-16 NOTE — Progress Notes (Signed)
A consult was placed to the hospital's IV Nurse for new IV access, as the previous iv was pulled out;  pt with bruises and scabs noted on all extremities; pt hollering "Oh God!  Help!"  Will not hold still, kicks legs;  RN and pt's Mom at bedside;  attempted x 3 with ultrasound but unable to thread catheters once a blood return is noted; Mom stated "she's had 9 surgeries back to back"; significant scar tissue noted.  No further attempts made for iv access.

## 2022-12-16 NOTE — Progress Notes (Signed)
Inpatient Follow-up/Progress Note   Patient ID: Abigail [name] is a 57 y.o. female.  Overnight Events / Subjective Findings NAEON per nursing. Pt still will not participate in exam or interact. Lying in hospital bed without gown or clothing. Appears to be comfortable. Currently wearing mittens. She has refused to drink contrast despite multiple efforts by nursing staff yesterday/today.  Review of Systems  Unable to perform ROS: Patient nonverbal     Medications  Current Facility-Administered Medications:    cefTRIAXone (ROCEPHIN) 2 g in sodium chloride 0.9 % 100 mL IVPB, 2 g, Intravenous, Q24H, Floydene Flock, MD, Last Rate: 200 mL/hr at 12/15/22 1902, 2 g at 12/15/22 1902   clonazePAM (KLONOPIN) disintegrating tablet 0.5 mg, 0.5 mg, Oral, BID PRN, Charm Rings, NP   dextrose 5 % and 0.45 % NaCl with KCl 20 mEq/L infusion, , Intravenous, Continuous, Floydene Flock, MD, Last Rate: 125 mL/hr at 12/15/22 1759, New Bag at 12/15/22 1759   enoxaparin (LOVENOX) injection 40 mg, 40 mg, Subcutaneous, Q24H, Floydene Flock, MD, 40 mg at 12/15/22 2049   HYDROmorphone (DILAUDID) injection 0.5 mg, 0.5 mg, Intravenous, Q4H PRN, Floydene Flock, MD, 0.5 mg at 12/16/22 0248   metroNIDAZOLE (FLAGYL) IVPB 500 mg, 500 mg, Intravenous, Q12H, Floydene Flock, MD, Last Rate: 100 mL/hr at 12/15/22 2059, 500 mg at 12/15/22 2059   ondansetron (ZOFRAN) tablet 4 mg, 4 mg, Oral, Q6H PRN **OR** ondansetron (ZOFRAN) injection 4 mg, 4 mg, Intravenous, Q6H PRN, Floydene Flock, MD   sodium chloride 0.9 % bolus 500 mL, 500 mL, Intravenous, Once, Willy Eddy, MD  cefTRIAXone (ROCEPHIN)  IV 2 g (12/15/22 1902)   dextrose 5 % and 0.45 % NaCl with KCl 20 mEq/L 125 mL/hr at 12/15/22 1759   metronidazole 500 mg (12/15/22 2059)   sodium chloride      clonazepam, HYDROmorphone (DILAUDID) injection, ondansetron **OR** ondansetron (ZOFRAN) IV   Objective    Vitals:   12/15/22 1658 12/15/22 2030  12/15/22 2337 12/16/22 0443  BP: (!) 146/81 (!) 147/94 123/60 135/81  Pulse: 75 95 72 98  Resp: 16 18 16 16   Temp: (!) 97.5 F (36.4 C) 98.5 F (36.9 C) 98.7 F (37.1 C) 98.1 F (36.7 C)  TempSrc:  Oral Oral Oral  SpO2: 96% 100% 100% 98%     Physical Exam Vitals and nursing note reviewed.  Constitutional:      General: She is not in acute distress.    Appearance: She is ill-appearing. She is not toxic-appearing or diaphoretic.     Comments: Arousable to voice  HENT:     Head: Normocephalic and atraumatic.  Cardiovascular:     Rate and Rhythm: Normal rate.  Pulmonary:     Effort: Pulmonary effort is normal. No respiratory distress.  Abdominal:     General: There is no distension.     Palpations: Abdomen is soft.     Tenderness: There is no abdominal tenderness. There is no guarding or rebound.  Skin:    Comments: Bruising/scabbing scattered all over her body  Neurological:     Comments: Encephalopathic- only grunts/moans to questioning. Lying in bed with mittens and no gown on      Laboratory Data Recent Labs  Lab 12/10/22 0507 12/14/22 1400 12/15/22 0424  WBC 8.8 6.3 7.5  HGB 12.2 10.8* 10.4*  HCT 39.4 33.5* 31.5*  PLT 191 224 200   Recent Labs  Lab 12/14/22 1400 12/14/22 1422 12/14/22 2236 12/15/22 0424  NA 135  --  136 137  K 3.0*  --  2.7* 2.9*  CL 96*  --  97* 98  CO2 29  --  27 27  BUN 6  --  <5* <5*  CREATININE 0.46  --  0.57 0.45  CALCIUM 7.4*  --  7.4* 7.7*  PROT  --  5.0*  --  4.9*  BILITOT  --  1.2  --  1.1  ALKPHOS  --  96  --  102  ALT  --  22  --  22  AST  --  64*  --  53*  GLUCOSE 79  --  76 75   No results for input(s): "INR" in the last 168 hours.    Imaging Studies: CT ABDOMEN PELVIS W CONTRAST  Result Date: 12/14/2022 CLINICAL DATA:  Failure to thrive.  Pain. EXAM: CT ABDOMEN AND PELVIS WITH CONTRAST TECHNIQUE: Multidetector CT imaging of the abdomen and pelvis was performed using the standard protocol following bolus  administration of intravenous contrast. RADIATION DOSE REDUCTION: This exam was performed according to the departmental dose-optimization program which includes automated exposure control, adjustment of the mA and/or kV according to patient size and/or use of iterative reconstruction technique. CONTRAST:  OMNIPAQUE IOHEXOL 300 MG/ML  SOLN COMPARISON:  CT 05/24/2014.  X-ray 12/14/2022 FINDINGS: Lower chest: Mild linear opacity lung bases likely scar or atelectasis. Trace pleural fluid. Significant breathing motion at the lung bases. Hepatobiliary: Diffuse fatty liver infiltration. Areas of sparing along the gallbladder fossa. Patent portal vein. Gallbladder is nondilated. Pancreas: There is peripancreatic fat stranding. Several cystic areas are identified involving the pancreas itself in the surrounding tissues. These include of the pancreatic head involving the pancreatic groove for example on series 2, image 34 measuring 2.5 by 1.9 cm. Focus more superior on series 2, image 29 measures 19 by 15 mm. Several smaller foci in the midbody of the pancreas on image 27 of series 2, image 24 of series 2. There is also some loculated fluid collections abutting the tail of the pancreas of the greater curve of the stomach with the adjacent wall thickening of the stomach. Example on series 2, image 19 measures 3.3 x 1.9 cm. Based on overall appearance these could be the sequela of pancreatitis with pseudocyst formation. Please correlate with specific clinical history. Spleen: Normal in size without focal abnormality. Small splenules are noted. Adrenals/Urinary Tract: The adrenal glands are unremarkable. Kidneys are without enhancing mass or collecting system dilatation. The ureters have normal course and caliber down to the bladder which has a preserved contour. Stomach/Bowel: Large bowel is nondilated but has diffuse wall thickening greatest involving the ascending colon. Please correlate for colitis. Surgical changes  along the base of the cecum in the appendix is not seen. The stomach is mildly distended with fluid. There is wall thickening along the second portion of the duodenum. There are some mildly distended proximal jejunal loops with mild wall thickening. Ileum is decompressed. Vascular/Lymphatic: Normal caliber aorta and IVC with scattered vascular calcifications. Calcifications are also seen along the branch vessels. No specific abnormal lymph node enlargement identified in the pelvis. There are some enlarged nodes in the porta hepatis. Example on series 2, image 27 measuring 2.1 x 1.2 cm. Focus portacaval series 2, image 24 measures 19 by 11 mm. Other porta hepatic nodes as well such as image 24 of series 2. Anterior to the portal vein. Reproductive: Uterus and bilateral adnexa are unremarkable. Other: Anasarca. Mesenteric stranding. No free air.  No frank ascites. Musculoskeletal: Curvature of the spine. Moderate degenerative changes of the spine and pelvis. There is significant streak artifact related to the right hip arthroplasty in the lower lumbar spine fixation hardware. There is moderate compression as well of the L1 level which is new from 2015 but has sclerotic margins. This could be more chronic there is also significant compression along the lower thoracic spine at T9, T8 and T7. Based on appearance this could be more subacute but age-indeterminate. Please correlate for any known history or dedicated evaluation when appropriate IMPRESSION: Significant stranding along the pancreas with several loculated fluid collections identified involving the pancreas well as adjacent tissues. This includes the pancreatic groove as well as tail of the pancreas of the against the greater curve of the stomach. Based on the overall appearance this could be related to evolution of the pancreatitis and pseudocyst formation. Please correlate with specific clinical presentation. Wall thickening along the colon diffusely greatest  along the right side with some stranding. Please correlate for colitis is also some mild distention with some fold thickening along loops of jejunum. Fatty liver infiltration. Several enlarged lymph nodes in the porta hepatis right upper quadrant. This has a broad differential with the other findings and recommend attention on short follow-up Trace pleural fluid. Multilevel compression deformities along the spine. Those involving the mid to lower thoracic spine may be more subacute but are age-indeterminate and recommend further evaluation if there is no known history. Electronically Signed   By: Karen Kays M.D.   On: 12/14/2022 16:27   CT HEAD WO CONTRAST ( )  Result Date: 12/14/2022 CLINICAL DATA:  Mental status change, cause.  Failure to thrive. EXAM: CT HEAD WITHOUT CONTRAST TECHNIQUE: Contiguous axial images were obtained from the base of the skull through the vertex without intravenous contrast. RADIATION DOSE REDUCTION: This exam was performed according to the departmental dose-optimization program which includes automated exposure control, adjustment of the mA and/or kV according to patient size and/or use of iterative reconstruction technique. COMPARISON:  CT head without contrast 4/19/4. FINDINGS: Brain: No acute infarct, hemorrhage, or mass lesion is present. Mild white matter changes in the anterior limb of the internal capsule bilaterally are stable. Basal ganglia are intact. Insular ribbon is bilaterally. The ventricles are of normal size. No significant extraaxial fluid collection is present. The brainstem and cerebellum are within normal limits. Midline structures are within normal limits. Vascular: No hyperdense vessel or unexpected calcification. Skull: Calvarium is intact. No focal lytic or blastic lesions are present. No significant extracranial soft tissue lesion is present. Sinuses/Orbits: Chronic left maxillary sinus opacification is present. The sinus is shrunken. The paranasal  sinuses and mastoid air cells are otherwise clear. The globes and orbits are within normal limits. IMPRESSION: 1. No acute intracranial abnormality. 2. Mild white matter changes are stable. 3. Chronic left maxillary sinus disease. Electronically Signed   By: Marin Roberts M.D.   On: 12/14/2022 16:20   DG Chest Portable 1 View  Result Date: 12/14/2022 CLINICAL DATA:  Altered mental status, evaluate for infiltrate. EXAM: PORTABLE CHEST 1 VIEW COMPARISON:  Chest radiograph dated December 07, 2022 FINDINGS: The heart size and mediastinal contours are within normal limits. Low lung volumes without evidence of focal consolidation or pleural effusion. Thoracic spondylosis and postsurgical changes for prior ACDF. No acute osseous abnormality. IMPRESSION: Low lung volumes without evidence of acute cardiopulmonary process. Electronically Signed   By: Larose Hires D.O.   On: 12/14/2022 15:50   DG Abdomen 1  View  Result Date: 12/14/2022 CLINICAL DATA:  Abdominal pain EXAM: ABDOMEN - 1 VIEW COMPARISON:  None Available. FINDINGS: Dilated loops of small bowel. No gas seen in the colon. Prior right total hip replacement. Orthopedic hardware of the lumbar spine. No acute osseous abnormality. IMPRESSION: Dilated loops of small bowel concerning for small-bowel obstruction. Abdominal and pelvis CT could be performed for better evaluation. Electronically Signed   By: Allegra Lai M.D.   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  Radiology has recommended repeat CT with p.o. contrast -Patient currently afebrile with seemingly no abdominal pain.  Lipase within normal limits.  CT does demonstrate peripancreatic fat stranding   #Colitis reported on imaging-seemingly asymptomatic -History of  opioid-induced constipation   # Acute encephalopathy -History of opioid dependence with overdose   # Giant cell arteritis and rheumatoid arthritis   #Fatty liver disease   #Multiple electrolyte abnormalities   #Chronic anemia normocytic   Plan:  Patient remains nonparticipatory in evaluation or care Have requested CT with p.o. contrast versus upper GI/SBFT study to assess for obstruction, however, patient refusing to drink contrast Does not appear to be having diarrhea as infectious studies have not been able to be performed Other areas of inflammation may be sequelae of her pancreatitis and inflammatory response.  Ok to advance diet as tolerated  Would be judicious with pain control given the patient's encephalopathy-unclear if this is true encephalopathy versus psychiatric etiology  Complete alcohol and tobacco cessation recommended   Symptomatic control including pain relief and antiemetics as per primary team Electrolyte correction as per primary team and pharmacy  Follow-up with primary GI as outpatient for her MRI MRCP  GI to sign off. Available as needed. Please do not hesitate to call regarding questions or concerns.  I personally performed the service.  Management of other medical comorbidities as per primary team  Thank you for allowing [place] to participate in this patient's care.   Jaynie Collins, DO [facility]  Portions of the record may have been created with voice recognition software. Occasional wrong-word or 'sound-a-like' substitutions may have occurred due to the inherent limitations of voice recognition software.  Read the chart carefully and recognize, using context, where substitutions may have occurred.

## 2022-12-17 DIAGNOSIS — E039 Hypothyroidism, unspecified: Secondary | ICD-10-CM

## 2022-12-17 DIAGNOSIS — K219 Gastro-esophageal reflux disease without esophagitis: Secondary | ICD-10-CM

## 2022-12-17 DIAGNOSIS — E162 Hypoglycemia, unspecified: Secondary | ICD-10-CM

## 2022-12-17 DIAGNOSIS — E876 Hypokalemia: Secondary | ICD-10-CM

## 2022-12-17 DIAGNOSIS — K529 Noninfective gastroenteritis and colitis, unspecified: Secondary | ICD-10-CM | POA: Diagnosis not present

## 2022-12-17 DIAGNOSIS — I10 Essential (primary) hypertension: Secondary | ICD-10-CM

## 2022-12-17 DIAGNOSIS — G9340 Encephalopathy, unspecified: Secondary | ICD-10-CM

## 2022-12-17 DIAGNOSIS — K859 Acute pancreatitis without necrosis or infection, unspecified: Secondary | ICD-10-CM

## 2022-12-17 DIAGNOSIS — Z79891 Long term (current) use of opiate analgesic: Secondary | ICD-10-CM | POA: Diagnosis not present

## 2022-12-17 DIAGNOSIS — F419 Anxiety disorder, unspecified: Secondary | ICD-10-CM

## 2022-12-17 LAB — LIPASE, BLOOD: Lipase: 67 U/L — ABNORMAL HIGH (ref 11–51)

## 2022-12-17 LAB — BASIC METABOLIC PANEL
Anion gap: 10 (ref 5–15)
BUN: <5 mg/dL — ABNORMAL LOW (ref 6–20)
CO2: 33 mmol/L — ABNORMAL HIGH (ref 22–32)
Calcium: 7.7 mg/dL — ABNORMAL LOW (ref 8.9–10.3)
Chloride: 92 mmol/L — ABNORMAL LOW (ref 98–111)
Creatinine, Ser: 0.49 mg/dL (ref 0.44–1.00)
GFR, Estimated: >60 mL/min (ref >60)
Glucose, Bld: 105 mg/dL — ABNORMAL HIGH (ref 70–99)
Potassium: 2.8 mmol/L — ABNORMAL LOW (ref 3.5–5.1)
Sodium: 135 mmol/L (ref 135–145)

## 2022-12-17 LAB — CBC
HCT: 35.3 % — ABNORMAL LOW (ref 36.0–46.0)
Hemoglobin: 11.8 g/dL — ABNORMAL LOW (ref 12.0–15.0)
MCH: 29.5 pg (ref 26.0–34.0)
MCHC: 33.4 g/dL (ref 30.0–36.0)
MCV: 88.3 fL (ref 80.0–100.0)
Platelets: 223 10*3/uL (ref 150–400)
RBC: 4 MIL/uL (ref 3.87–5.11)
RDW: 13.6 % (ref 11.5–15.5)
WBC: 10.7 10*3/uL — ABNORMAL HIGH (ref 4.0–10.5)
nRBC: 0 % (ref 0.0–0.2)

## 2022-12-17 LAB — GLUCOSE, CAPILLARY
Glucose-Capillary: 101 mg/dL — ABNORMAL HIGH (ref 70–99)
Glucose-Capillary: 110 mg/dL — ABNORMAL HIGH (ref 70–99)
Glucose-Capillary: 144 mg/dL — ABNORMAL HIGH (ref 70–99)
Glucose-Capillary: 155 mg/dL — ABNORMAL HIGH (ref 70–99)
Glucose-Capillary: 96 mg/dL (ref 70–99)

## 2022-12-17 LAB — AMMONIA: Ammonia: 33 umol/L (ref 9–35)

## 2022-12-17 LAB — FOLATE: Folate: 8.8 ng/mL (ref >5.9)

## 2022-12-17 LAB — VITAMIN B12: Vitamin B-12: 1239 pg/mL — ABNORMAL HIGH (ref 180–914)

## 2022-12-17 MED ORDER — POTASSIUM CHLORIDE 10 MEQ/100ML IV SOLN
10.0000 meq | INTRAVENOUS | Status: AC
  Administered 2022-12-17 (×4): 10 meq via INTRAVENOUS
  Filled 2022-12-17 (×4): qty 100

## 2022-12-17 MED ORDER — ACETAMINOPHEN 650 MG RE SUPP
650.0000 mg | Freq: Four times a day (QID) | RECTAL | Status: DC | PRN
  Administered 2022-12-17: 650 mg via RECTAL
  Filled 2022-12-17: qty 1

## 2022-12-17 MED ORDER — PANTOPRAZOLE SODIUM 40 MG IV SOLR
40.0000 mg | INTRAVENOUS | Status: DC
  Administered 2022-12-17 – 2022-12-18 (×2): 40 mg via INTRAVENOUS
  Filled 2022-12-17 (×2): qty 10

## 2022-12-17 NOTE — Progress Notes (Addendum)
PROGRESS NOTE    Ellison Leisure [facility]  ZOX:[id number] DOB: [birth year] DOA: 12/14/2022 PCP: Alease Medina, MD    Brief Narrative:   57 y.o. female with medical history significant of anxiety, fibromyalgia, chronic pain, IBS, colitis presented to hospital with intermittent hallucinations confusion and decreased oral intake and abdominal pain. Patient was recently admitted in April for encephalopathy in the setting of hypoglycemia.  In the ED, patient was hemodynamically stable.  Potassium was low at 3.0.   CT abdomen pelvis with pancreatitis with multiple pseudocyst formation as well as right-sided colitis.  Patient was then seen by GI and the pseudocyst was managed conservatively.  He remained encephalopathic and was refusing p.o.  Pancreatitis has resolved at this time.  Assessment and Plan:   Pancreatitis with pseudocyst. Presenting with pancreatitis with several pseudocysts.  Follow the patient during hospitalization but patient had refused oral contrast administration and GI has signed off.  Currently n.p.o. IV fluids.   Patient is empirically on antibiotic with IV Rocephin.  Will need MRI MRCP at some point but will need to encourage oral intake.  Currently unsafe to take orally.  If unable to take orally by tomorrow might need to consider tube feeding which might be difficult on her with likelihood of pulling it out.   Acute Colitis  Noted on the CT scan.  Continue Rocephin and Flagyl.  WBC at 10.7, patient is afebrile.   Acute metabolic encephalopathy EtOH and UDS -positive for TCA.  CT head with maxillary sinus disease.  On Cymbalta and Wellbutrin at baseline.  Psych on board.  Klonopin as needed.  Patient continues to be moaning.  Patient has not received sedative hypnotics today but he still very somnolent and minimally interactive.  At baseline patient is normally able to get up and around but recently for a month or so has had decline in her cognition and activities.  No fever  or leukocytosis at this time.  Patient is on room air.  Will add vitamin B12, folic acid, ammonia levels.  TSH level 10 days back was 1.5.  History of fibromyalgia, irritable bowel syndrome, anxiety.  Recent stressor/depression.  Did lose her boyfriend of 20 years and had been more depressed since then as per the patient's daughter.  She has had decreased oral intake for almost a month now.  Will follow psychiatry recommendations.   Chronic prescription opiate use Noted baseline heavy oxycodone use in the setting of chronic back pain. Continue pain management at this time.  Will try to minimize if possible.   Hypoglycemia Blood glucose levels in the 70s.  Previous history of hypoglycemia induced metabolic encephalopathy.  Continue D5 water.  Patient is currently n.p.o. status.  When able to take p.o. we will consider further.   Essential hypertension Blood pressure seems to be stable.  Hypothyroidism Synthroid once diet gets advanced   Hypokalemia Significantly low potassium.  Will continue to replenish aggressively.  Potassium was 2.8 today.  On D5 half-normal saline with potassium.  Will give additional 40 mEq of potassium today.  Gastroesophageal reflux disease Continue PPI      DVT prophylaxis: enoxaparin (LOVENOX) injection 40 mg Start: 12/14/22 2000 SCDs Start: 12/14/22 1729   Code Status:     Code Status: Full Code  Disposition: Uncertain at this time.  Status is: Inpatient  Remains inpatient appropriate because: Pancreatitis with pseudocyst, encephalopathy, significant electrolyte imbalance, poor oral intake.   Family Communication:  I tried to reach the patient's father mother on the  phone but was unable to reach but was able to reach the patient's daughter Ms. Judeth Cornfield.Marland Kitchen  Spoke with the patient's daughter at length.  Spoke preliminary about needing a feeding tube if unable to do p.o.  Consultants:  GI Psychiatry  Procedures:  None  Antimicrobials:  Rocephin  and Flagyl IV.  Anti-infectives (From admission, onward)    Start     Dose/Rate Route Frequency Ordered Stop   12/15/22 1800  cefTRIAXone (ROCEPHIN) 2 g in sodium chloride 0.9 % 100 mL IVPB        2 g 200 mL/hr over 30 Minutes Intravenous Every 24 hours 12/14/22 1818     12/15/22 0800  metroNIDAZOLE (FLAGYL) IVPB 500 mg        500 mg 100 mL/hr over 60 Minutes Intravenous Every 12 hours 12/14/22 1819     12/14/22 1715  cefTRIAXone (ROCEPHIN) 2 g in sodium chloride 0.9 % 100 mL IVPB        2 g 200 mL/hr over 30 Minutes Intravenous  Once 12/14/22 1703 12/14/22 1847   12/14/22 1715  metroNIDAZOLE (FLAGYL) IVPB 500 mg        500 mg 100 mL/hr over 60 Minutes Intravenous  Once 12/14/22 1703 12/14/22 2257      Subjective: Today, patient was seen and examined at bedside.  Patient continues to moan and is a poor historian.  Not much interactive.  Nursing staff reported that patient has not been able to eat any  Objective: Vitals:   12/16/22 1611 12/16/22 1959 12/17/22 0757 12/17/22 1228  BP: (!) 142/75 112/71 126/64 (!) 144/75  Pulse: 75 84 98 (!) 105  Resp: 18 20 16 17   Temp: 98.4 F (36.9 C) 98.7 F (37.1 C) 99.5 F (37.5 C) 99.1 F (37.3 C)  TempSrc: Oral     SpO2: 99% 98% 100% 95%    Intake/Output Summary (Last 24 hours) at 12/17/2022 1330 Last data filed at 12/17/2022 2952 Gross per 24 hour  Intake 4455.28 ml  Output 1 ml  Net 4454.28 ml   There were no vitals filed for this visit.  Physical Examination: There is no height or weight on file to calculate BMI.   General: Obese built, not in obvious distress, patient not interactive, moaning, HENT:   No scleral pallor or icterus noted. Oral mucosa is moist.  Chest:    Diminished breath sounds bilaterally. No crackles or wheezes.  CVS: S1 &S2 heard. No murmur.  Regular rate and rhythm. Abdomen: Soft, moans when pressed on the abdomen, bowel sounds are heard.   Extremities: No cyanosis, clubbing or edema.  Peripheral pulses  are palpable. Psych: Somnolent, moaning. CNS: Somnolent, moaning, Skin: Warm and dry.  No rashes noted.  Data Reviewed:   CBC: Recent Labs  Lab 12/14/22 1400 12/15/22 0424 12/16/22 0858 12/17/22 0557  WBC 6.3 7.5 8.5 10.7*  HGB 10.8* 10.4* 10.9* 11.8*  HCT 33.5* 31.5* 33.4* 35.3*  MCV 91.8 90.5 90.8 88.3  PLT 224 200 210 223    Basic Metabolic Panel: Recent Labs  Lab 12/14/22 1400 12/14/22 1836 12/14/22 2236 12/15/22 0424 12/16/22 0858 12/17/22 0557  NA 135  --  136 137 135 135  K 3.0*  --  2.7* 2.9* 3.1* 2.8*  CL 96*  --  97* 98 96* 92*  CO2 29  --  27 27 29  33*  GLUCOSE 79  --  76 75 88 105*  BUN 6  --  <5* <5* <5* <5*  CREATININE 0.46  --  0.57 0.45 0.52 0.49  CALCIUM 7.4*  --  7.4* 7.7* 7.9* 7.7*  MG  --  1.8  --   --  1.8  --     Liver Function Tests: Recent Labs  Lab 12/14/22 1422 12/15/22 0424 12/16/22 0858  AST 64* 53* 37  ALT 22 22 20   ALKPHOS 96 102 104  BILITOT 1.2 1.1 0.8  PROT 5.0* 4.9* 5.5*  ALBUMIN 1.8* 1.9* 2.1*     Radiology Studies: No results found.    LOS: 3 days    Joycelyn Das, MD [facility] Available via Epic secure chat 7am-7pm After these hours, please refer to coverage provider listed on [email or web address] 12/17/2022, 1:30 PM

## 2022-12-17 NOTE — TOC Initial Note (Signed)
Transition of Care [facility]) - Initial/Assessment Note    Patient Details  Name: Abigail [name] MRN: [id number] Date of Birth: [birth year]  [facility]) CM/SW Contact:    Allena Katz, LCSW Phone Number: 12/17/2022, 2:43 PM  Clinical Narrative:  Pt currently disoriented. TOC following for care plan changes and discharge needs.                        Patient Goals and CMS Choice            Expected Discharge Plan and Services                                              Prior Living Arrangements/Services                       Activities of Daily Living Home Assistive Devices/Equipment: None ADL Screening (condition at time of admission) Patient's cognitive ability adequate to safely complete daily activities?: No Is the patient deaf or have difficulty hearing?: No Does the patient have difficulty seeing, even when wearing glasses/contacts?: Yes Does the patient have difficulty concentrating, remembering, or making decisions?: Yes Patient able to express need for assistance with ADLs?: Yes Does the patient have difficulty dressing or bathing?: Yes Independently performs ADLs?: No Communication: Independent Does the patient have difficulty walking or climbing stairs?: Yes Weakness of Legs: Both Weakness of Arms/Hands: Both  Permission Sought/Granted                  Emotional Assessment              Admission diagnosis:  Colitis [K52.9] Pancreatitis [K85.90] Acute pancreatitis, unspecified complication status, unspecified pancreatitis type [K85.90] Patient Active Problem List   Diagnosis Date Noted   Pancreatitis 12/14/2022   Acute encephalopathy 12/14/2022   Essential hypertension 12/08/2022   Hypothyroidism 12/08/2022   Hypokalemia 12/08/2022   Hypoglycemia 12/08/2022   Opioid overdose (HCC) 12/07/2022   Acute metabolic encephalopathy 12/07/2022   Asthma 04/26/2020   Vertigo 04/26/2020   Drug-induced  constipation 04/26/2020   Nausea 04/26/2020   Encounter to establish care with new doctor 04/26/2020   Gastroesophageal reflux disease 11/12/2016   Chronic prescription opiate use 04/29/2016   Colitis 04/29/2016   Long term current use of immunosuppressive drug 04/29/2016   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 [place] [address] Phone: [phone] Fax: [phone]  [facility] [phone] [place], [place] - [address] [place] [address] Phone: [phone] Fax: [phone]  [id number] - [place], [place] - [address] [place] [address] Phone: [phone] Fax: [phone]     Social Determinants of Health (SDOH) Social History: SDOH Screenings   Food Insecurity: Food Insecurity Present (12/14/2022)  Transportation Needs: No Transportation Needs (12/08/2022)  Tobacco Use: High Risk (12/07/2022)   SDOH Interventions:     Readmission Risk Interventions     No data to display

## 2022-12-17 NOTE — Progress Notes (Signed)
IV access obtained by IV Team 20G in the Right FA without difficulty. IV site wrapped for securement due to pt pulling out numerous IV's. Pt requires frequent monitoring . Resting at this time.

## 2022-12-17 NOTE — Hospital Course (Signed)
57 year old female past medical history of anxiety, fibromyalgia, chronic pain, IBS, colitis.  Presented with acute pancreatitis, colitis, acute metabolic encephalopathy and hypokalemia. Patient had been given antibiotics.  4/30.  Patient moans with sternal rub and moves her extremities but did not answer questions.  Patient has been n.p.o. since being here.  ABG shows a pH of 7.47, pCO2 of 44, pO2 of 59.  This morning's potassium is 2.4 and magnesium 2.1.  Both magnesium and potassium given IV replacement.  Started on high-dose thiamine.  MRI of the brain shows relatively symmetric bilateral parieto-occipital signal abnormalities with regions of true diffusion restriction which could be seen in setting of press or severe hypoglycemic metabolic encephalopathy.  Will get neurology consultation.  Will obtain EEG.  Messaged about EEG showing status epilepticus.  Case discussed with Dr. Otelia Limes neurology.  He ordered 2 mg of IV Ativan.  I spoke with the nurse to give.  He will likely order loading medication.  Will set up transfer to [facility] for continuous EEG monitoring.

## 2022-12-17 NOTE — Consult Note (Signed)
The patient was observed to be sleeping soundly during rounding and did not arouse when her name was called multiple times or when tapped on the arm.  The rise and fall of the chest was observed, and the patient did not appear to be in any distress.  The assigned nurse, Stanton Kidney, RN reported that the patient opens her eyes to her name being called but has not engaged in coherent conversation.  She denied observing any hallucinations and reports the patient was noted to frequently yell out "oh my God". She reports the patient admitted to experiencing abdominal pain yesterday " and that is the only intelligible conversation" she has observed. The psych team will continue to follow patient and assess when engagement in conversation is possible.  Jarryn Altland H. Raneisha Bress 12/17/2022 1116

## 2022-12-18 ENCOUNTER — Encounter (HOSPITAL_COMMUNITY): Payer: Self-pay

## 2022-12-18 ENCOUNTER — Inpatient Hospital Stay: Payer: Medicare HMO

## 2022-12-18 ENCOUNTER — Ambulatory Visit: Payer: Medicare HMO

## 2022-12-18 ENCOUNTER — Encounter: Payer: Self-pay | Admitting: Family Medicine

## 2022-12-18 ENCOUNTER — Inpatient Hospital Stay (HOSPITAL_COMMUNITY)
Admission: AD | Admit: 2022-12-18 | Discharge: 2022-12-26 | DRG: 100 | Disposition: A | Discharge status: Home Health | Payer: Medicare HMO | Attending: Internal Medicine | Admitting: Internal Medicine

## 2022-12-18 DIAGNOSIS — E43 Unspecified severe protein-calorie malnutrition: Secondary | ICD-10-CM | POA: Diagnosis present

## 2022-12-18 DIAGNOSIS — Z886 Allergy status to analgesic agent status: Secondary | ICD-10-CM

## 2022-12-18 DIAGNOSIS — E162 Hypoglycemia, unspecified: Secondary | ICD-10-CM | POA: Diagnosis not present

## 2022-12-18 DIAGNOSIS — K760 Fatty (change of) liver, not elsewhere classified: Secondary | ICD-10-CM | POA: Diagnosis not present

## 2022-12-18 DIAGNOSIS — E039 Hypothyroidism, unspecified: Secondary | ICD-10-CM | POA: Diagnosis present

## 2022-12-18 DIAGNOSIS — R627 Adult failure to thrive: Secondary | ICD-10-CM | POA: Diagnosis present

## 2022-12-18 DIAGNOSIS — K861 Other chronic pancreatitis: Secondary | ICD-10-CM

## 2022-12-18 DIAGNOSIS — G40901 Epilepsy, unspecified, not intractable, with status epilepticus: Secondary | ICD-10-CM

## 2022-12-18 DIAGNOSIS — G9341 Metabolic encephalopathy: Secondary | ICD-10-CM | POA: Diagnosis not present

## 2022-12-18 DIAGNOSIS — K529 Noninfective gastroenteritis and colitis, unspecified: Secondary | ICD-10-CM

## 2022-12-18 DIAGNOSIS — Z885 Allergy status to narcotic agent status: Secondary | ICD-10-CM

## 2022-12-18 DIAGNOSIS — Z79891 Long term (current) use of opiate analgesic: Secondary | ICD-10-CM

## 2022-12-18 DIAGNOSIS — Z881 Allergy status to other antibiotic agents status: Secondary | ICD-10-CM | POA: Diagnosis not present

## 2022-12-18 DIAGNOSIS — F419 Anxiety disorder, unspecified: Secondary | ICD-10-CM | POA: Diagnosis not present

## 2022-12-18 DIAGNOSIS — G894 Chronic pain syndrome: Secondary | ICD-10-CM | POA: Diagnosis present

## 2022-12-18 DIAGNOSIS — K863 Pseudocyst of pancreas: Secondary | ICD-10-CM | POA: Diagnosis not present

## 2022-12-18 DIAGNOSIS — I10 Essential (primary) hypertension: Secondary | ICD-10-CM | POA: Diagnosis present

## 2022-12-18 DIAGNOSIS — Z79899 Other long term (current) drug therapy: Secondary | ICD-10-CM

## 2022-12-18 DIAGNOSIS — E559 Vitamin D deficiency, unspecified: Secondary | ICD-10-CM | POA: Diagnosis not present

## 2022-12-18 DIAGNOSIS — M069 Rheumatoid arthritis, unspecified: Secondary | ICD-10-CM | POA: Diagnosis present

## 2022-12-18 DIAGNOSIS — K219 Gastro-esophageal reflux disease without esophagitis: Secondary | ICD-10-CM | POA: Diagnosis not present

## 2022-12-18 DIAGNOSIS — G9349 Other encephalopathy: Secondary | ICD-10-CM | POA: Diagnosis present

## 2022-12-18 DIAGNOSIS — Z66 Do not resuscitate: Secondary | ICD-10-CM | POA: Insufficient documentation

## 2022-12-18 DIAGNOSIS — E876 Hypokalemia: Secondary | ICD-10-CM | POA: Diagnosis present

## 2022-12-18 DIAGNOSIS — Z85828 Personal history of other malignant neoplasm of skin: Secondary | ICD-10-CM

## 2022-12-18 DIAGNOSIS — Z981 Arthrodesis status: Secondary | ICD-10-CM

## 2022-12-18 DIAGNOSIS — G40101 Localization-related (focal) (partial) symptomatic epilepsy and epileptic syndromes with simple partial seizures, not intractable, with status epilepticus: Principal | ICD-10-CM | POA: Diagnosis present

## 2022-12-18 DIAGNOSIS — K859 Acute pancreatitis without necrosis or infection, unspecified: Secondary | ICD-10-CM

## 2022-12-18 DIAGNOSIS — K589 Irritable bowel syndrome without diarrhea: Secondary | ICD-10-CM | POA: Diagnosis present

## 2022-12-18 DIAGNOSIS — M797 Fibromyalgia: Secondary | ICD-10-CM | POA: Diagnosis present

## 2022-12-18 DIAGNOSIS — Z6828 Body mass index (BMI) 28.0-28.9, adult: Secondary | ICD-10-CM

## 2022-12-18 DIAGNOSIS — Z882 Allergy status to sulfonamides status: Secondary | ICD-10-CM

## 2022-12-18 DIAGNOSIS — E1165 Type 2 diabetes mellitus with hyperglycemia: Secondary | ICD-10-CM | POA: Diagnosis present

## 2022-12-18 DIAGNOSIS — F32A Depression, unspecified: Secondary | ICD-10-CM | POA: Diagnosis present

## 2022-12-18 DIAGNOSIS — R000 Tachycardia, unspecified: Secondary | ICD-10-CM | POA: Diagnosis not present

## 2022-12-18 DIAGNOSIS — Z90710 Acquired absence of both cervix and uterus: Secondary | ICD-10-CM | POA: Diagnosis not present

## 2022-12-18 DIAGNOSIS — M316 Other giant cell arteritis: Secondary | ICD-10-CM | POA: Insufficient documentation

## 2022-12-18 DIAGNOSIS — H5462 Unqualified visual loss, left eye, normal vision right eye: Secondary | ICD-10-CM | POA: Diagnosis present

## 2022-12-18 DIAGNOSIS — E785 Hyperlipidemia, unspecified: Secondary | ICD-10-CM | POA: Diagnosis present

## 2022-12-18 DIAGNOSIS — Z79631 Long term (current) use of antimetabolite agent: Secondary | ICD-10-CM

## 2022-12-18 DIAGNOSIS — Z91048 Other nonmedicinal substance allergy status: Secondary | ICD-10-CM

## 2022-12-18 DIAGNOSIS — D649 Anemia, unspecified: Secondary | ICD-10-CM | POA: Diagnosis not present

## 2022-12-18 DIAGNOSIS — F17210 Nicotine dependence, cigarettes, uncomplicated: Secondary | ICD-10-CM | POA: Diagnosis present

## 2022-12-18 DIAGNOSIS — Z9049 Acquired absence of other specified parts of digestive tract: Secondary | ICD-10-CM

## 2022-12-18 DIAGNOSIS — J320 Chronic maxillary sinusitis: Secondary | ICD-10-CM | POA: Diagnosis not present

## 2022-12-18 DIAGNOSIS — Z818 Family history of other mental and behavioral disorders: Secondary | ICD-10-CM

## 2022-12-18 DIAGNOSIS — Z888 Allergy status to other drugs, medicaments and biological substances status: Secondary | ICD-10-CM

## 2022-12-18 DIAGNOSIS — Z560 Unemployment, unspecified: Secondary | ICD-10-CM

## 2022-12-18 LAB — COMPREHENSIVE METABOLIC PANEL
ALT: 22 U/L (ref 0–44)
AST: 62 U/L — ABNORMAL HIGH (ref 15–41)
Albumin: 1.9 g/dL — ABNORMAL LOW (ref 3.5–5.0)
Alkaline Phosphatase: 80 U/L (ref 38–126)
Anion gap: 12 (ref 5–15)
BUN: <5 mg/dL — ABNORMAL LOW (ref 6–20)
CO2: 28 mmol/L (ref 22–32)
Calcium: 7.4 mg/dL — ABNORMAL LOW (ref 8.9–10.3)
Chloride: 95 mmol/L — ABNORMAL LOW (ref 98–111)
Creatinine, Ser: 0.52 mg/dL (ref 0.44–1.00)
GFR, Estimated: >60 mL/min (ref >60)
Glucose, Bld: 100 mg/dL — ABNORMAL HIGH (ref 70–99)
Potassium: 2.4 mmol/L — CL (ref 3.5–5.1)
Sodium: 135 mmol/L (ref 135–145)
Total Bilirubin: 1.2 mg/dL (ref 0.3–1.2)
Total Protein: 5 g/dL — ABNORMAL LOW (ref 6.5–8.1)

## 2022-12-18 LAB — GLUCOSE, CAPILLARY
Glucose-Capillary: 89 mg/dL (ref 70–99)
Glucose-Capillary: 90 mg/dL (ref 70–99)
Glucose-Capillary: 93 mg/dL (ref 70–99)

## 2022-12-18 LAB — BLOOD GAS, ARTERIAL
Acid-Base Excess: 7.4 mmol/L — ABNORMAL HIGH (ref 0.0–2.0)
Bicarbonate: 32 mmol/L — ABNORMAL HIGH (ref 20.0–28.0)
O2 Saturation: 91.8 %
Patient temperature: 37
pCO2 arterial: 44 mmHg (ref 32–48)
pH, Arterial: 7.47 — ABNORMAL HIGH (ref 7.35–7.45)
pO2, Arterial: 59 mmHg — ABNORMAL LOW (ref 83–108)

## 2022-12-18 LAB — CBC
HCT: 31.2 % — ABNORMAL LOW (ref 36.0–46.0)
Hemoglobin: 10.6 g/dL — ABNORMAL LOW (ref 12.0–15.0)
MCH: 29.5 pg (ref 26.0–34.0)
MCHC: 34 g/dL (ref 30.0–36.0)
MCV: 86.9 fL (ref 80.0–100.0)
Platelets: 193 10*3/uL (ref 150–400)
RBC: 3.59 MIL/uL — ABNORMAL LOW (ref 3.87–5.11)
RDW: 13.9 % (ref 11.5–15.5)
WBC: 14.7 10*3/uL — ABNORMAL HIGH (ref 4.0–10.5)
nRBC: 0 % (ref 0.0–0.2)

## 2022-12-18 LAB — VITAMIN D 25 HYDROXY (VIT D DEFICIENCY, FRACTURES): Vit D, 25-Hydroxy: 50.23 ng/mL (ref 30–100)

## 2022-12-18 LAB — MAGNESIUM: Magnesium: 1.8 mg/dL (ref 1.7–2.4)

## 2022-12-18 MED ORDER — LORAZEPAM 2 MG/ML IJ SOLN: 1.0000 mg | INTRAMUSCULAR | Status: DC | PRN

## 2022-12-18 MED ORDER — KCL-LACTATED RINGERS-D5W 20 MEQ/L IV SOLN: 75.0000 mL/h | INTRAVENOUS | 0 refills | Status: DC

## 2022-12-18 MED ORDER — PANTOPRAZOLE SODIUM 40 MG IV SOLR: 40.0000 mg | INTRAVENOUS | Status: DC

## 2022-12-18 MED ORDER — THIAMINE HCL 100 MG/ML IJ SOLN: 500.0000 mg | Freq: Three times a day (TID) | INTRAVENOUS | Status: DC

## 2022-12-18 MED ORDER — SODIUM CHLORIDE 0.9 % IV SOLN
1.0000 mg | Freq: Every day | INTRAVENOUS | Status: DC
  Administered 2022-12-18: 1 mg via INTRAVENOUS
  Filled 2022-12-18: qty 0.2

## 2022-12-18 MED ORDER — LEVETIRACETAM IN NACL 1000 MG/100ML IV SOLN: 1000.0000 mg | Freq: Two times a day (BID) | INTRAVENOUS | Status: DC

## 2022-12-18 MED ORDER — THIAMINE MONONITRATE 100 MG PO TABS: 100.0000 mg | ORAL_TABLET | Freq: Every day | ORAL | Status: DC

## 2022-12-18 MED ORDER — MAGNESIUM SULFATE 2 GM/50ML IV SOLN
2.0000 g | Freq: Once | INTRAVENOUS | Status: AC
  Administered 2022-12-18: 2 g via INTRAVENOUS
  Filled 2022-12-18: qty 50

## 2022-12-18 MED ORDER — LEVETIRACETAM IN NACL 1000 MG/100ML IV SOLN
1000.0000 mg | Freq: Once | INTRAVENOUS | Status: AC
  Administered 2022-12-18: 1000 mg via INTRAVENOUS
  Filled 2022-12-18: qty 100

## 2022-12-18 MED ORDER — LORAZEPAM 2 MG/ML IJ SOLN
2.0000 mg | INTRAMUSCULAR | Status: DC | PRN
  Administered 2022-12-18: 2 mg via INTRAVENOUS
  Filled 2022-12-18: qty 1

## 2022-12-18 MED ORDER — METRONIDAZOLE 500 MG/100ML IV SOLN: 500.0000 mg | Freq: Two times a day (BID) | INTRAVENOUS | Status: DC

## 2022-12-18 MED ORDER — THIAMINE HCL 100 MG/ML IJ SOLN: 100.0000 mg | Freq: Every day | INTRAMUSCULAR | Status: DC

## 2022-12-18 MED ORDER — ONDANSETRON HCL 4 MG/2ML IJ SOLN: 4.0000 mg | Freq: Four times a day (QID) | INTRAMUSCULAR | 0 refills | Status: DC | PRN

## 2022-12-18 MED ORDER — SODIUM CHLORIDE 0.9 % IV SOLN: 2.0000 g | INTRAVENOUS | Status: DC

## 2022-12-18 MED ORDER — SODIUM CHLORIDE 0.9 % IV SOLN: 1.0000 mg | Freq: Every day | INTRAVENOUS | Status: DC

## 2022-12-18 MED ORDER — KCL-LACTATED RINGERS-D5W 20 MEQ/L IV SOLN
INTRAVENOUS | Status: DC
  Filled 2022-12-18 (×2): qty 1000

## 2022-12-18 MED ORDER — THIAMINE HCL 100 MG/ML IJ SOLN
500.0000 mg | Freq: Three times a day (TID) | INTRAVENOUS | Status: DC
  Administered 2022-12-18 (×2): 500 mg via INTRAVENOUS
  Filled 2022-12-18 (×3): qty 5

## 2022-12-18 MED ORDER — POTASSIUM CHLORIDE 10 MEQ/100ML IV SOLN
10.0000 meq | INTRAVENOUS | Status: AC
  Administered 2022-12-18 (×6): 10 meq via INTRAVENOUS
  Filled 2022-12-18 (×6): qty 100

## 2022-12-18 MED ORDER — LORAZEPAM 1 MG PO TABS: 1.0000 mg | ORAL_TABLET | ORAL | Status: DC | PRN

## 2022-12-18 MED ORDER — ENOXAPARIN SODIUM 40 MG/0.4ML IJ SOSY: 40.0000 mg | PREFILLED_SYRINGE | INTRAMUSCULAR | Status: DC

## 2022-12-18 MED ORDER — SODIUM CHLORIDE 0.9 % IV SOLN: 2000.0000 mg | Freq: Once | INTRAVENOUS | Status: DC

## 2022-12-18 MED ORDER — ACETAMINOPHEN 650 MG RE SUPP: 650.0000 mg | Freq: Four times a day (QID) | RECTAL | 0 refills | Status: DC | PRN

## 2022-12-18 MED ORDER — LORAZEPAM 2 MG/ML IJ SOLN: 2.0000 mg | INTRAMUSCULAR | 0 refills | Status: DC | PRN

## 2022-12-18 MED ORDER — HYDROMORPHONE HCL 1 MG/ML IJ SOLN: 0.5000 mg | INTRAMUSCULAR | 0 refills | Status: DC | PRN

## 2022-12-18 NOTE — Progress Notes (Signed)
Initial Nutrition Assessment  DOCUMENTATION CODES:   Not applicable  INTERVENTION:  - Consider NG tube within 24-48 hrs if unable to advance diet.   NUTRITION DIAGNOSIS:   Inadequate oral intake related to inability to eat as evidenced by NPO status.  GOAL:   Patient will meet greater than or equal to 90% of their needs  MONITOR:   PO intake  REASON FOR ASSESSMENT:   NPO/Clear Liquid Diet    ASSESSMENT:   57 y.o. female admits related to FTT. PMH includes: basal cell carcinoma, arthritis, HLD, IBS. Pt is currently receiving medical management related to pancreatitis with pseudocyst.  Meds reviewed:  thiamine. Labs reviewed: K low, chloride low.   Pt was sleeping at time of assessment and would not wake to sound of voice or knock on the door. Pt is currently NPO and has not been able to eat since admission. Pt is currently oriented x1. RD messaged MD in regards to possible NG tube for nutrition. MD states that he has placed orders for EEG and neuro consult. No significant wt loss per record and no family present to provide information. RD will continue to monitor POC. Recommend NG tube placement if unable to advance diet within 24-48 hrs.   NUTRITION - FOCUSED PHYSICAL EXAM:  Flowsheet Row Most Recent Value  Orbital Region No depletion  Upper Arm Region No depletion  Thoracic and Lumbar Region No depletion  Buccal Region No depletion  Temple Region Mild depletion  Clavicle Bone Region Mild depletion  Clavicle and Acromion Bone Region No depletion  Scapular Bone Region No depletion  Dorsal Hand No depletion  Patellar Region No depletion  Anterior Thigh Region No depletion  Posterior Calf Region No depletion  Edema (RD Assessment) None  Hair Reviewed  Eyes Reviewed  Mouth Reviewed  Skin Reviewed  Nails Reviewed       Diet Order:   Diet Order             Diet NPO time specified  Diet effective now                   EDUCATION NEEDS:   Not  appropriate for education at this time  Skin:  Skin Assessment: Reviewed RN Assessment  Last BM:  4/28 - type 7  Height:   Ht Readings from Last 1 Encounters:  12/18/22 5\' 9"  (1.753 m)    Weight:   Wt Readings from Last 1 Encounters:  12/09/22 93.5 kg    Ideal Body Weight:     BMI:  Body mass index is 30.44 kg/m.  Estimated Nutritional Needs:   Kcal:  1610-9604 kcals  Protein:  90-115 gm  Fluid:  >/= 1.8 L  Bethann Humble, RD, LDN, CNSC.

## 2022-12-18 NOTE — Assessment & Plan Note (Addendum)
Called this evening with EEG being positive for status epilepticus.  Spoke with neurology here Dr. Otelia Limes who ordered 2 mg of IV Ativan and I spoke with the nurse to give.  Keppra 2000 mg IV once and 1000 mg IV twice daily ordered.  Spoke with transfer center of at [facility] and physician at [facility].  Patient will need neurology consultation and continuous EEG monitoring.

## 2022-12-18 NOTE — Progress Notes (Signed)
Bed assignment for [facility] received. [address], 11 [address], Bed 1. Report called to Karolee Ohs, RN at [facility].

## 2022-12-18 NOTE — Assessment & Plan Note (Signed)
Patient unable to give much history at this time.

## 2022-12-18 NOTE — Assessment & Plan Note (Addendum)
Life-threatening, severe hypokalemia.  Replacing potassium and magnesium IV.

## 2022-12-18 NOTE — Progress Notes (Signed)
Patient arrived via [facility]. Vital signs taken and neurology and [facility] paged via [facility].

## 2022-12-18 NOTE — Discharge Summary (Signed)
Physician Discharge Summary   Patient: Abigail [name] [facility] MRN: [id number] DOB: [birth year]  Admit date:     12/14/2022  Discharge date to [facility]: 12/18/22  Discharge Physician: Alford Highland   PCP: Alease Medina, MD   Recommendations at discharge:   Transfer to [facility] when bed available  Discharge Diagnoses: Principal Problem:   Status epilepticus [facility]) Active Problems:   Acute metabolic encephalopathy   Hypokalemia   Acute pancreatitis   Acute colitis   Chronic prescription opiate use   Hypoglycemia   Essential hypertension   Hypothyroidism   Gastroesophageal reflux disease   Anxiety    Hospital Course: 57 year old female past medical history of anxiety, fibromyalgia, chronic pain, IBS, colitis.  Presented with acute pancreatitis, colitis, acute metabolic encephalopathy and hypokalemia. Patient had been given antibiotics.  4/30.  Patient moans with sternal rub and moves her extremities but did not answer questions.  Patient has been n.p.o. since being here.  ABG shows a pH of 7.47, pCO2 of 44, pO2 of 59.  This morning's potassium is 2.4 and magnesium 2.1.  Both magnesium and potassium given IV replacement.  Started on high-dose thiamine.  MRI of the brain shows relatively symmetric bilateral parieto-occipital signal abnormalities with regions of true diffusion restriction which could be seen in setting of press or severe hypoglycemic metabolic encephalopathy.  Will get neurology consultation.  Will obtain EEG.  Messaged about EEG showing status epilepticus.  Case discussed with Dr. Otelia Limes neurology.  He ordered 2 mg of IV Ativan.  I spoke with the nurse to give.  He will likely order loading medication.  Will set up transfer to [facility] for continuous EEG monitoring.       Assessment and Plan: * Status epilepticus (HCC) Called this evening with EEG being positive for status epilepticus.  Spoke with neurology here Dr. Otelia Limes  who ordered 2 mg of IV Ativan and I spoke with the nurse to give.  He will likely order loading medication.  Spoke with transfer center of at [facility] and physician at [facility].  Patient will need neurology consultation and continuous EEG monitoring.  Acute metabolic encephalopathy Likely secondary to status epilepticus.  Patient moans with sternal rub and moves her extremities.  Unable to communicate at this time.  MRI showing symmetrical bilateral parieto-occipital signal abnormality with regions of true diffusion restriction.  Could be seen in the setting of press or severe hypoglycemic metabolic encephalopathy.    Hypokalemia Life-threatening, severe hypokalemia.  Replacing potassium and magnesium IV.  Acute pancreatitis Patient unable to give much history at this time.  Acute colitis Patient on empiric antibiotics.  Chronic prescription opiate use Patient takes standing dose oxycodone as outpatient.  On as needed IV Dilaudid here.  Hypoglycemia Recent admission for hypoglycemia associated metabolic encephalopathy.  Continue to watch sugars closely.  D5 in IV fluids.  Essential hypertension Unable to take any oral medications  Hypothyroidism Synthroid once able to eat.  Recent TSH 1.573  Gastroesophageal reflux disease On Protonix IV  Anxiety On alcohol withdrawal protocol         Consultants: Neurology Procedures performed: EEG Disposition: Discharged to [facility] when bed available for continuous EEG monitoring and treatment admitted to my discharge summary follow-up Manalo added my summary and I will be good and I spoke with the medical Diet recommendation:  npo DISCHARGE MEDICATION: Allergies as of 12/18/2022       Reactions   Nsaids Hives   Tapentadol  Other (See Comments), Rash, Swelling   Made her deathly sick   Codeine    Darvocet [propoxyphene N-acetaminophen]    Sulfa Antibiotics    Tape         Medication List     STOP taking  these medications    albuterol 108 (90 Base) MCG/ACT inhaler Commonly known as: VENTOLIN HFA   buPROPion 150 MG 24 hr tablet Commonly known as: WELLBUTRIN XL   DULoxetine 60 MG capsule Commonly known as: CYMBALTA   gabapentin 300 MG capsule Commonly known as: NEURONTIN   levothyroxine 75 MCG tablet Commonly known as: SYNTHROID   lidocaine 5 % Commonly known as: LIDODERM   mupirocin ointment 2 % Commonly known as: BACTROBAN   Oxycodone HCl 20 MG Tabs   pantoprazole 40 MG tablet Commonly known as: PROTONIX Replaced by: pantoprazole 40 MG injection   polyethylene glycol powder 17 GM/SCOOP powder Commonly known as: GLYCOLAX/MIRALAX   potassium chloride 10 MEQ tablet Commonly known as: KLOR-CON   promethazine 25 MG tablet Commonly known as: PHENERGAN   thiamine 100 MG tablet Commonly known as: VITAMIN B1   Vitamin D3 10 MCG (400 UNIT) tablet       TAKE these medications    acetaminophen 650 MG suppository Commonly known as: TYLENOL Place 1 suppository (650 mg total) rectally every 6 (six) hours as needed for fever.   cefTRIAXone 2 g in sodium chloride 0.9 % 100 mL Inject 2 g into the vein daily. Start taking on: Dec 19, 2022   dextrose 5% lactated ringers with KCl 20 mEq/L 20 MEQ/L infusion Inject 75 mL/hr into the vein continuous.   enoxaparin 40 MG/0.4ML injection Commonly known as: LOVENOX Inject 0.4 mLs (40 mg total) into the skin daily.   folic acid 1 mg in sodium chloride 0.9 % 50 mL Inject 1 mg into the vein daily. Start taking on: Dec 19, 2022   HYDROmorphone 1 MG/ML injection Commonly known as: DILAUDID Inject 0.5 mLs (0.5 mg total) into the vein every 4 (four) hours as needed for severe pain.   LORazepam 2 MG/ML injection Commonly known as: ATIVAN Inject 1 mL (2 mg total) into the vein every 4 (four) hours as needed for seizure.   metroNIDAZOLE 500 MG/100ML Commonly known as: FLAGYL Inject 100 mLs (500 mg total) into the vein every 12  (twelve) hours.   ondansetron 4 MG/2ML Soln injection Commonly known as: ZOFRAN Inject 2 mLs (4 mg total) into the vein every 6 (six) hours as needed for nausea.   pantoprazole 40 MG injection Commonly known as: PROTONIX Inject 40 mg into the vein daily. Start taking on: Dec 19, 2022 Replaces: pantoprazole 40 MG tablet   sodium chloride 0.9 % SOLN 50 mL with thiamine 100 MG/ML SOLN 500 mg Inject 500 mg into the vein every 8 (eight) hours.        Discharge Exam: Physical Exam HENT:     Head: Normocephalic.  Eyes:     General: Lids are normal.     Conjunctiva/sclera: Conjunctivae normal.  Cardiovascular:     Rate and Rhythm: Normal rate and regular rhythm.     Heart sounds: Normal heart sounds, S1 normal and S2 normal.  Pulmonary:     Breath sounds: No decreased breath sounds, wheezing, rhonchi or rales.  Abdominal:     Palpations: Abdomen is soft.     Tenderness: There is no abdominal tenderness.  Musculoskeletal:     Right lower leg: No swelling.  Left lower leg: No swelling.  Skin:    General: Skin is warm.     Findings: No rash.  Neurological:     Mental Status: She is lethargic.     Comments: moaned with sternal rub.      Condition at discharge: serious  The results of significant diagnostics from this hospitalization (including imaging, microbiology, ancillary and laboratory) are listed below for reference.   Imaging Studies: EEG adult  Result Date: 2022-12-26 [name], MD     2022-12-26  5:27 PM Patient Name: Abigail [name] [facility] MRN: [id number] Epilepsy Attending: [name] Referring Physician/Provider: Alford Highland, MD Date: 26-Dec-2022 Duration: 26.12 mins Patient history: 57 year old female past medical history of anxiety, fibromyalgia, chronic pain, IBS, colitis. Presented with acute pancreatitis, colitis, acute metabolic encephalopathy and hypokalemia. EEG to evaluate for seizure Level of alertness:  comatose AEDs during EEG  study: None Technical aspects: This EEG study was done with scalp electrodes positioned according to the 10-20 International system of electrode placement. Electrical activity was reviewed with band pass filter of 1-70Hz , sensitivity of 7 uV/mm, display speed of 31mm/sec with a 60Hz  notched filter applied as appropriate. EEG data were recorded continuously and digitally stored.  Video monitoring was available and reviewed as appropriate. Description: EEG showed continuous generalized polymorphic sharply contoured 3 to 6 Hz theta-delta slowing. Bilateral independent epileptiform discharges were noted in left and right occipital region with fluctuating frequency of 1-2hz  , at times rhythmic with evolution in morphology and frequency. Per EEG etch patient was confused and had left eye jerking. 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  Hyperventilation and photic stimulation were not performed.   ABNORMALITY - Focal motor status epilepticus,  left and right occipital region. 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 Clinically patient was confused and had left eye jerking. Additionally there was severe diffuse encephalopathy. Dr. Otelia Limes and Dr Hilton Sinclair were notified. [name]   MR BRAIN WO CONTRAST  Result Date: 12/26/2022 CLINICAL DATA:  Altered mental status EXAM: MRI HEAD WITHOUT CONTRAST TECHNIQUE: Multiplanar, multiecho pulse sequences of the brain and surrounding structures were obtained without intravenous contrast. COMPARISON:  CT Head 12/14/22 FINDINGS: Brain: There is hyperintense signal on diffusion-weighted imaging in the bilateral parieto-occipital region with corresponding signal abnormality on the ADC map. There is also T2/FLAIR hyperintense signal abnormalities region. No hemorrhage. No extra-axial fluid collection. No hydrocephalus.  Vascular: Normal flow voids. Skull and upper cervical spine: Normal marrow signal. Sinuses/Orbits: No middle ear or mastoid effusion. Mucosal thickening left maxillary sinus. Orbits are unremarkable. Other: None. IMPRESSION: Relatively symmetric bilateral parieto-occipital signal abnormality with regions of true diffusion restriction. Findings could be seen in the setting of PRES or severe hypoglycemic metabolic encephalopathy. Electronically Signed   By: Lorenza Cambridge M.D.   On: 2022-12-26 12:42   CT ABDOMEN PELVIS W CONTRAST  Result Date: 12/14/2022 CLINICAL DATA:  Failure to thrive.  Pain. EXAM: CT ABDOMEN AND PELVIS WITH CONTRAST TECHNIQUE: Multidetector CT imaging of the abdomen and pelvis was performed using the standard protocol following bolus administration of intravenous contrast. RADIATION DOSE REDUCTION: This exam was performed according to the departmental dose-optimization program which includes automated exposure control, adjustment of the mA and/or kV according to patient size and/or use of iterative reconstruction technique. CONTRAST:  OMNIPAQUE IOHEXOL 300 MG/ML  SOLN COMPARISON:  CT 05/24/2014.  X-ray 12/14/2022 FINDINGS:  Lower chest: Mild linear opacity lung bases likely scar or atelectasis. Trace pleural fluid. Significant breathing motion at the lung bases. Hepatobiliary: Diffuse fatty liver infiltration. Areas of sparing along the gallbladder fossa. Patent portal vein. Gallbladder is nondilated. Pancreas: There is peripancreatic fat stranding. Several cystic areas are identified involving the pancreas itself in the surrounding tissues. These include of the pancreatic head involving the pancreatic groove for example on series 2, image 34 measuring 2.5 by 1.9 cm. Focus more superior on series 2, image 29 measures 19 by 15 mm. Several smaller foci in the midbody of the pancreas on image 27 of series 2, image 24 of series 2. There is also some loculated fluid collections abutting the  tail of the pancreas of the greater curve of the stomach with the adjacent wall thickening of the stomach. Example on series 2, image 19 measures 3.3 x 1.9 cm. Based on overall appearance these could be the sequela of pancreatitis with pseudocyst formation. Please correlate with specific clinical history. Spleen: Normal in size without focal abnormality. Small splenules are noted. Adrenals/Urinary Tract: The adrenal glands are unremarkable. Kidneys are without enhancing mass or collecting system dilatation. The ureters have normal course and caliber down to the bladder which has a preserved contour. Stomach/Bowel: Large bowel is nondilated but has diffuse wall thickening greatest involving the ascending colon. Please correlate for colitis. Surgical changes along the base of the cecum in the appendix is not seen. The stomach is mildly distended with fluid. There is wall thickening along the second portion of the duodenum. There are some mildly distended proximal jejunal loops with mild wall thickening. Ileum is decompressed. Vascular/Lymphatic: Normal caliber aorta and IVC with scattered vascular calcifications. Calcifications are also seen along the branch vessels. No specific abnormal lymph node enlargement identified in the pelvis. There are some enlarged nodes in the porta hepatis. Example on series 2, image 27 measuring 2.1 x 1.2 cm. Focus portacaval series 2, image 24 measures 19 by 11 mm. Other porta hepatic nodes as well such as image 24 of series 2. Anterior to the portal vein. Reproductive: Uterus and bilateral adnexa are unremarkable. Other: Anasarca. Mesenteric stranding. No free air. No frank ascites. Musculoskeletal: Curvature of the spine. Moderate degenerative changes of the spine and pelvis. There is significant streak artifact related to the right hip arthroplasty in the lower lumbar spine fixation hardware. There is moderate compression as well of the L1 level which is new from 2015 but has  sclerotic margins. This could be more chronic there is also significant compression along the lower thoracic spine at T9, T8 and T7. Based on appearance this could be more subacute but age-indeterminate. Please correlate for any known history or dedicated evaluation when appropriate IMPRESSION: Significant stranding along the pancreas with several loculated fluid collections identified involving the pancreas well as adjacent tissues. This includes the pancreatic groove as well as tail of the pancreas of the against the greater curve of the stomach. Based on the overall appearance this could be related to evolution of the pancreatitis and pseudocyst formation. Please correlate with specific clinical presentation. Wall thickening along the colon diffusely greatest along the right side with some stranding. Please correlate for colitis is also some mild distention with some fold thickening along loops of jejunum. Fatty liver infiltration. Several enlarged lymph nodes in the porta hepatis right upper quadrant. This has a broad differential with the other findings and recommend attention on short follow-up Trace pleural fluid. Multilevel compression deformities along the spine.  Those involving the mid to lower thoracic spine may be more subacute but are age-indeterminate and recommend further evaluation if there is no known history. Electronically Signed   By: Karen Kays M.D.   On: 12/14/2022 16:27   CT HEAD WO CONTRAST ( )  Result Date: 12/14/2022 CLINICAL DATA:  Mental status change, cause.  Failure to thrive. EXAM: CT HEAD WITHOUT CONTRAST TECHNIQUE: Contiguous axial images were obtained from the base of the skull through the vertex without intravenous contrast. RADIATION DOSE REDUCTION: This exam was performed according to the departmental dose-optimization program which includes automated exposure control, adjustment of the mA and/or kV according to patient size and/or use of iterative reconstruction  technique. COMPARISON:  CT head without contrast 4/19/4. FINDINGS: Brain: No acute infarct, hemorrhage, or mass lesion is present. Mild white matter changes in the anterior limb of the internal capsule bilaterally are stable. Basal ganglia are intact. Insular ribbon is bilaterally. The ventricles are of normal size. No significant extraaxial fluid collection is present. The brainstem and cerebellum are within normal limits. Midline structures are within normal limits. Vascular: No hyperdense vessel or unexpected calcification. Skull: Calvarium is intact. No focal lytic or blastic lesions are present. No significant extracranial soft tissue lesion is present. Sinuses/Orbits: Chronic left maxillary sinus opacification is present. The sinus is shrunken. The paranasal sinuses and mastoid air cells are otherwise clear. The globes and orbits are within normal limits. IMPRESSION: 1. No acute intracranial abnormality. 2. Mild white matter changes are stable. 3. Chronic left maxillary sinus disease. Electronically Signed   By: Marin Roberts M.D.   On: 12/14/2022 16:20   DG Chest Portable 1 View  Result Date: 12/14/2022 CLINICAL DATA:  Altered mental status, evaluate for infiltrate. EXAM: PORTABLE CHEST 1 VIEW COMPARISON:  Chest radiograph dated December 07, 2022 FINDINGS: The heart size and mediastinal contours are within normal limits. Low lung volumes without evidence of focal consolidation or pleural effusion. Thoracic spondylosis and postsurgical changes for prior ACDF. No acute osseous abnormality. IMPRESSION: Low lung volumes without evidence of acute cardiopulmonary process. Electronically Signed   By: Larose Hires D.O.   On: 12/14/2022 15:50   DG Abdomen 1 View  Result Date: 12/14/2022 CLINICAL DATA:  Abdominal pain EXAM: ABDOMEN - 1 VIEW COMPARISON:  None Available. FINDINGS: Dilated loops of small bowel. No gas seen in the colon. Prior right total hip replacement. Orthopedic hardware of the lumbar  spine. No acute osseous abnormality. IMPRESSION: Dilated loops of small bowel concerning for small-bowel obstruction. Abdominal and pelvis CT could be performed for better evaluation. Electronically Signed   By: Allegra Lai M.D.   On: 12/14/2022 15:21   DG Knee Complete 4 Views Right  Result Date: 12/08/2022 CLINICAL DATA:  Right knee pain and limited range of motion after a fall. Initial encounter. EXAM: RIGHT KNEE - COMPLETE 4+ VIEW COMPARISON:  None Available. FINDINGS: No evidence of fracture, dislocation, or joint effusion. There is mild medial compartment joint space narrowing and osteophytosis. Soft tissues are unremarkable. IMPRESSION: No acute finding. Mild medial compartment degenerative change. Electronically Signed   By: Drusilla Kanner M.D.   On: 12/08/2022 08:44   CT Head Wo Contrast  Result Date: 12/07/2022 CLINICAL DATA:  Altered mental status EXAM: CT HEAD WITHOUT CONTRAST TECHNIQUE: Contiguous axial images were obtained from the base of the skull through the vertex without intravenous contrast. RADIATION DOSE REDUCTION: This exam was performed according to the departmental dose-optimization program which includes automated exposure control, adjustment of the mA  and/or kV according to patient size and/or use of iterative reconstruction technique. COMPARISON:  None Available. FINDINGS: Brain: No evidence of acute infarction, hemorrhage, hydrocephalus, extra-axial collection or mass lesion/mass effect. Vascular: No hyperdense vessel or unexpected calcification. Skull: Normal. Negative for fracture or focal lesion. Sinuses/Orbits: There is dense opacification in atelectasis of the left maxillary sinus in keeping with changes of chronic sinusitis. Remaining paranasal sinuses are clear. Orbits are unremarkable. Other: Mastoid air cells and middle ear cavities are clear IMPRESSION: 1. No acute intracranial abnormality. 2. Chronic left maxillary sinusitis. Electronically Signed   By: Helyn Numbers M.D.   On: 12/07/2022 21:24   DG Chest Port 1 View  Result Date: 12/07/2022 CLINICAL DATA:  Dyspnea, hypoglycemia EXAM: PORTABLE CHEST 1 VIEW COMPARISON:  None Available. FINDINGS: Lungs volumes are small, but are symmetric and are clear. No pneumothorax or pleural effusion. Cardiac size within normal limits. Pulmonary vascularity is normal. Osseous structures are age-appropriate. No acute bone abnormality. IMPRESSION: No active disease. Electronically Signed   By: Helyn Numbers M.D.   On: 12/07/2022 21:00    Microbiology: Results for orders placed or performed in visit on 07/03/19  Novel Coronavirus, NAA ([facility])     Status: None   Collection Time: 07/03/19 12:00 AM   Specimen: Nasopharyngeal(NP) swabs in vial transport medium   NASOPHARYNGE  TESTING  Result Value Ref Range Status   SARS-CoV-2, NAA Not Detected Not Detected Final    Comment: This nucleic acid amplification test was developed and its performance characteristics determined by [employer]. Nucleic acid amplification tests include PCR and TMA. This test has not been FDA cleared or approved. This test has been authorized by FDA under an Emergency Use Authorization (EUA). This test is only authorized for the duration of time the declaration that circumstances exist justifying the authorization of the emergency use of in vitro diagnostic tests for detection of SARS-CoV-2 virus and/or diagnosis of COVID-19 infection under section 564(b)(1) of the Act, 21 U.S.C. 409WJX-9(J) (1), unless the authorization is terminated or revoked sooner. When diagnostic testing is negative, the possibility of a false negative result should be considered in the context of a patient's recent exposures and the presence of clinical signs and symptoms consistent with COVID-19. An individual without symptoms of COVID-19 and who is not shedding SARS-CoV-2 virus would  expect to have a negative (not detected) result in this assay.      Labs: CBC: Recent Labs  Lab 12/14/22 1400 12/15/22 0424 12/16/22 0858 12/17/22 0557 12/18/22 0632  WBC 6.3 7.5 8.5 10.7* 14.7*  HGB 10.8* 10.4* 10.9* 11.8* 10.6*  HCT 33.5* 31.5* 33.4* 35.3* 31.2*  MCV 91.8 90.5 90.8 88.3 86.9  PLT 224 200 210 223 193   Basic Metabolic Panel: Recent Labs  Lab 12/14/22 1836 12/14/22 2236 12/15/22 0424 12/16/22 0858 12/17/22 0557 12/18/22 0632  NA  --  136 137 135 135 135  K  --  2.7* 2.9* 3.1* 2.8* 2.4*  CL  --  97* 98 96* 92* 95*  CO2  --  27 27 29  33* 28  GLUCOSE  --  76 75 88 105* 100*  BUN  --  <5* <5* <5* <5* <5*  CREATININE  --  0.57 0.45 0.52 0.49 0.52  CALCIUM  --  7.4* 7.7* 7.9* 7.7* 7.4*  MG 1.8  --   --  1.8  --  1.8   Liver Function Tests: Recent Labs  Lab 12/14/22 1422 12/15/22 0424 12/16/22 0858 12/18/22 0632  AST  64* 53* 37 62*  ALT 22 22 20 22   ALKPHOS 96 102 104 80  BILITOT 1.2 1.1 0.8 1.2  PROT 5.0* 4.9* 5.5* 5.0*  ALBUMIN 1.8* 1.9* 2.1* 1.9*   CBG: Recent Labs  Lab 12/17/22 1222 12/17/22 1647 12/17/22 1952 12/18/22 1419 12/18/22 1725  GLUCAP 101* 144* 155* 89 90    Discharge time spent: greater than 30 minutes.  Signed: Alford Highland, MD [facility] 12/18/2022

## 2022-12-18 NOTE — Assessment & Plan Note (Signed)
Unable to take any oral medications

## 2022-12-18 NOTE — Assessment & Plan Note (Signed)
Continue IV Protonix 

## 2022-12-18 NOTE — TOC Progression Note (Signed)
Transition of Care [name]) - Progression Note    Patient Details  Name: Abigail [name] MRN: [id number] Date of Birth: [birth year]  Transition of Care [facility]) CM/SW Contact  Allena Katz, LCSW Phone Number: 12/18/2022, 10:12 AM  Clinical Narrative:   Substance use resources added to AVS. CSW will wait until pt is AXO4 to consult for any additional care needs. TOC following.          Expected Discharge Plan and Services                                               Social Determinants of Health (SDOH) Interventions SDOH Screenings   Food Insecurity: Food Insecurity Present (12/14/2022)  Transportation Needs: No Transportation Needs (12/08/2022)  Tobacco Use: High Risk (12/07/2022)    Readmission Risk Interventions     No data to display

## 2022-12-18 NOTE — Assessment & Plan Note (Addendum)
Likely secondary to status epilepticus.  Patient moans with sternal rub and moves her extremities.  Unable to communicate at this time.  MRI showing symmetrical bilateral parieto-occipital signal abnormality with regions of true diffusion restriction.  Could be seen in the setting of press or severe hypoglycemic metabolic encephalopathy.

## 2022-12-18 NOTE — Progress Notes (Signed)
Progress Note   Patient: Abigail [name] [facility] ZOX:[id number] DOB: 06-10-1966 DOA: 12/14/2022     4 DOS: the patient was seen and examined on 12/18/2022   Brief hospital course: 57 year old female past medical history of anxiety, fibromyalgia, chronic pain, IBS, colitis.  Presented with acute pancreatitis, colitis, acute metabolic encephalopathy and hypokalemia. Patient had been given antibiotics.  4/30.  Patient moans with sternal rub and moves her extremities but did not answer questions.  Patient has been n.p.o. since being here.  ABG shows a pH of 7.47, pCO2 of 44, pO2 of 59.  This morning's potassium is 2.4 and magnesium 2.1.  Both magnesium and potassium given IV replacement.  Started on high-dose thiamine.  MRI of the brain shows relatively symmetric bilateral parieto-occipital signal abnormalities with regions of true diffusion restriction which could be seen in setting of press or severe hypoglycemic metabolic encephalopathy.  Will get neurology consultation.  Will obtain EEG.       Assessment and Plan: * Acute metabolic encephalopathy Patient moans with sternal rub and moves her extremities.  Unable to communicate at this time.  MRI showing symmetrical bilateral parieto-occipital signal abnormality with regions of true diffusion restriction.  Could be seen in the setting of press or severe hypoglycemic metabolic encephalopathy.  Will check fingersticks every 4.  Will get an EEG.  Will give high-dose thiamine.  Hypokalemia Life-threatening, severe hypokalemia.  Replace potassium and magnesium IV.  Acute pancreatitis Patient unable to give much history at this time.  Acute colitis Patient on empiric antibiotics.  Chronic prescription opiate use Patient takes standing dose oxycodone as outpatient.  On as needed IV Dilaudid here.  Hypoglycemia Recent admission for hypoglycemia associated metabolic encephalopathy.  Continue to watch sugars closely.  D5 and IV  fluids.  Essential hypertension Unable to take any oral medications  Hypothyroidism Synthroid once able to eat  Gastroesophageal reflux disease On Protonix IV  Anxiety On alcohol withdrawal protocol        Subjective: Patient moaned when I gave her sternal rub.  Unable to get any history from patient.  Admitted with colitis and pancreatitis.  Today has altered mental status.    Physical Exam: Vitals:   12/17/22 1950 12/18/22 0429 12/18/22 0818 12/18/22 1039  BP: 135/73 (!) 125/55 (!) 141/75   Pulse: (!) 104 91 88   Resp: 19 18 18    Temp: 99.8 F (37.7 C) 98.1 F (36.7 C) 97.8 F (36.6 C)   TempSrc: Oral Oral Oral   SpO2: 94% 94% 95%   Height:    5\' 9"  (1.753 m)   Physical Exam HENT:     Head: Normocephalic.  Eyes:     General: Lids are normal.     Conjunctiva/sclera: Conjunctivae normal.  Cardiovascular:     Rate and Rhythm: Normal rate and regular rhythm.     Heart sounds: Normal heart sounds, S1 normal and S2 normal.  Pulmonary:     Breath sounds: No decreased breath sounds, wheezing, rhonchi or rales.  Abdominal:     Palpations: Abdomen is soft.     Tenderness: There is no abdominal tenderness.  Musculoskeletal:     Right lower leg: No swelling.     Left lower leg: No swelling.  Skin:    General: Skin is warm.     Findings: No rash.  Neurological:     Mental Status: She is lethargic.     Comments: moaned with sternal rub.     Data Reviewed: Potassium 2.4, AST 62,  white blood cell count 14.7, hemoglobin 10.6, platelet count 193 MRI reviewed.  Family Communication: Updated mother primary contact in the computer.  Patient made a DNR.  Disposition: Status is: Inpatient Remains inpatient appropriate because: Patient with altered mental status  Planned Discharge Destination: To be determined    Time spent: 35 minutes Patient is critically ill and high risk for cardiopulmonary arrest.  Being treated for life-threatening severe hypokalemia.   Patient also has acute metabolic encephalopathy.  Will also get an EEG.  Case discussed with pharmacist, nursing staff and neurology.  Author: Alford Highland, MD 12/18/2022 1:16 PM  For on call review [email or web address].

## 2022-12-18 NOTE — Assessment & Plan Note (Signed)
Patient on empiric antibiotics.

## 2022-12-18 NOTE — Consult Note (Signed)
NEURO HOSPITALIST CONSULT NOTE   Requesting physician: Dr. Renae Gloss  Reason for Consult: AMS  History obtained from:  Mother and Chart     HPI:                                                                                                                                          Abigail [name] is a 57 y.o. female with a PMHx as documented below, including recent admission from 4/19 through 4/22 for metabolic encephalopathy in the setting of hypoglycemia, hypothyroidism, IBS, depression, fibromyalgia, RA and chronic pain syndrome with chronic opioid dependence who presented to the hospital on 4/26 with FTT. She had not been eating or drinking and had been complaining of full body pains on initial ED assessment. Evaluation revealed findings most consistent with pancreatitis and colitis. She was also encephalopathic and seemed to be having intermittent hallucinations in the ED. CBG was 79 on initial assessment. She was severely hypokalemic. Vitals in the ED were BP 116/74, HR 94, RR 20, 100% O2 sat, 98.1 F. CT head was grossly stable. Hospitalist service did have some concern for a possible psychiatric component of her encephalopathy. She has been started on high-dose thiamine. She is a heavy smoker, consuming about 2 packs per day per mother, and started at about the age of 9 with only one period during which she briefly quit before starting again.   MRI was obtained revealing extensive bilateral posterior parietal and occipital lobe cortical and subcortical white matter T2 hyperintensity with some restricted diffusion, suggesting PRES or hypoglycemic brain injury. Neurology was consulted for further evaluation.   Mother provides additional history in the room. Patient first started having symptoms consisting of declining vision sometime around Christmas. She was seen at [facility] twice for this, was worked up for temporal arteritis and started on steroids. Her vision has  continued to decline. Also since about Christmas, she has had intermittent twitching without LOC. She did have an admission here recently for severe hypoglycemia down to the 30's. She has essentially continued to deteriorate over the past 4 months since her symptoms started around Christmas.    Past Medical History:  Diagnosis Date   Anxiety    Arthritis    Back pain    Basal cell carcinoma 10/04/2021   R axilla - needs excised 11/28/21   Basal cell carcinoma 10/04/2021   L antecubital excised 11/14/21   Diarrhea 11/12/2016   Fibromyalgia    Generalized abdominal pain 11/12/2016   H. pylori infection    Hyperlipidemia    IBS (irritable bowel syndrome)    Infectious colitis 04/29/2016   Migraines    Moderate dehydration 04/29/2016   Muscle pain    Reflux    Unexplained weight loss 11/12/2016    Past  Surgical History:  Procedure Laterality Date   ABDOMINAL HYSTERECTOMY     APPENDECTOMY  2009   C5 FUSION     C6 FUSION     C7 FUSION     COLONOSCOPY  02/2006   COLONOSCOPY WITH PROPOFOL N/A 12/25/2016   Procedure: COLONOSCOPY WITH PROPOFOL;  Surgeon: Earline Mayotte, MD;  Location: [facility] ENDOSCOPY;  Service: Endoscopy;  Laterality: N/A;   ESOPHAGOGASTRODUODENOSCOPY (EGD) WITH PROPOFOL N/A 12/25/2016   Procedure: ESOPHAGOGASTRODUODENOSCOPY (EGD) WITH PROPOFOL;  Surgeon: Earline Mayotte, MD;  Location: [facility] ENDOSCOPY;  Service: Endoscopy;  Laterality: N/A;   FOOT SURGERY     HIP ARTHROPLASTY     L4 FUSION     L5 FUSION     S1 FUSION      No family history on file.           Social History:  reports that she has been smoking cigarettes. She has been smoking an average of 1 pack per day. She has never used smokeless tobacco. She reports current alcohol use. She reports that she does not use drugs.  Allergies  Allergen Reactions   Nsaids Hives   Tapentadol Other (See Comments), Rash and Swelling    Made her deathly sick   Codeine    Darvocet [Propoxyphene N-Acetaminophen]     Sulfa Antibiotics    Tape     MEDICATIONS:                                                                                                                     Prior to Admission:  Medications Prior to Admission  Medication Sig Dispense Refill Last Dose   albuterol (VENTOLIN HFA) 108 (90 Base) MCG/ACT inhaler Inhale 1-2 puffs into the lungs every 6 (six) hours as needed for wheezing or shortness of breath.   unknown   Cholecalciferol (VITAMIN D3) 10 MCG (400 UNIT) tablet Take 400 Units by mouth daily.   Past Week   DULoxetine (CYMBALTA) 60 MG capsule Take 60 mg by mouth 2 (two) times daily.   Past Week   gabapentin (NEURONTIN) 300 MG capsule Take 300-600 mg by mouth 3 (three) times daily.   Past Week   Oxycodone HCl 20 MG TABS Take 0.5 tablets by mouth every 6 (six) hours as needed.   Past Week   pantoprazole (PROTONIX) 40 MG tablet Take 40 mg by mouth daily.   Past Week   potassium chloride (KLOR-CON) 10 MEQ tablet Take 10 mEq by mouth daily.   Past Week   promethazine (PHENERGAN) 25 MG tablet Take 1 tablet (25 mg total) by mouth every 8 (eight) hours as needed for nausea or vomiting. 20 tablet 0 unknown   thiamine (VITAMIN B1) 100 MG tablet Take 1 tablet by mouth daily.   Past Week   atorvastatin (LIPITOR) 40 MG tablet  (Patient not taking: Reported on 12/08/2022)      buPROPion (WELLBUTRIN XL) 150 MG 24 hr tablet Take 150 mg by mouth daily.  folic acid (FOLVITE) 1 MG tablet Take 1 mg by mouth daily. (Patient not taking: Reported on 12/08/2022)      hydrochlorothiazide (HYDRODIURIL) 12.5 MG tablet Take 12.5 mg by mouth daily. (Patient not taking: Reported on 12/14/2022)   Not Taking   levothyroxine (SYNTHROID, LEVOTHROID) 75 MCG tablet Take 75 mcg by mouth daily before breakfast. (Patient not taking: Reported on 12/08/2022)      lidocaine (LIDODERM) 5 % Place 1 patch onto the skin daily. Remove & Discard patch within 12 hours or as directed by MD 30 patch 0    meclizine (ANTIVERT) 25 MG  tablet Take 1 tablet (25 mg total) by mouth 3 (three) times daily as needed for dizziness. (Patient not taking: Reported on 12/08/2022) 90 tablet 1    methotrexate (RHEUMATREX) 2.5 MG tablet Take 7.5 mg by mouth 3 (three) times a week. (Patient not taking: Reported on 12/08/2022)      mupirocin ointment (BACTROBAN) 2 % Apply 1 application  topically daily. Apply to scratches and bump at nose until healed. 22 g 0    polyethylene glycol powder (GLYCOLAX/MIRALAX) 17 GM/SCOOP powder Take 17 g by mouth 2 (two) times daily as needed. 3350 g 1    Scheduled:  enoxaparin (LOVENOX) injection  40 mg Subcutaneous Q24H   pantoprazole (PROTONIX) IV  40 mg Intravenous Q24H   [START ON 12/20/2022] thiamine (VITAMIN B1) injection  100 mg Intravenous Daily   Or   [START ON 12/20/2022] thiamine  100 mg Oral Daily   Continuous:  cefTRIAXone (ROCEPHIN)  IV Stopped (12/17/22 1850)   dextrose 5% lactated ringers with KCl 20 mEq/L     folic acid 1 mg in sodium chloride 0.9 % 50 mL IVPB 1 mg (12/18/22 1138)   metronidazole 500 mg (12/18/22 0738)   potassium chloride 10 mEq (12/18/22 1301)   sodium chloride     thiamine (VITAMIN B1) injection 500 mg (12/18/22 1058)     ROS:                                                                                                                                       Unable to obtain due to AMS.    Blood pressure (!) 141/75, pulse 88, temperature 97.8 F (36.6 C), temperature source Oral, resp. rate 18, height 5\' 9"  (1.753 m), SpO2 95 %.   General Examination:  Physical Exam HEENT- [place]/AT. No nuchal rigidity.  Lungs- Respirations unlabored Extremities- Warm and well-perfused  Neurological Examination Mental Status: Obtunded. Will exclaim with pain and swat at examiner's hand during sternal rub, but rapidly falls back asleep after noxious stimulus is discontinued. Does not  open eyes to command or pain. Not responding to her name. Not following any commands.  Cranial Nerves: II: No blink to threat. PERRL. III,IV, VI: Eyes are initially at the midline then deviate to the right of midline with slight conjugate roving EOM. Does not gaze past midline to the left spontaneously, to command or with oculocephalic maneuver. . V: Closes each eye when examiner attempts to passively elevate lids.  VII: Grimace is symmetric VIII: Unable to assess IX,X: Gag reflex deferred XI: Head is midline XII: Unable to assess Motor/Sensory: Swats at examiner's hand with 3-4/5 strength during sternal rub, right more than left.  Exclaims with pain and grimaces in response to noxious stimuli applied to all 4 limbs, but only with 2/5 non-antigravity movement.  Tone is mildly decreased x 4 No posturing or limb twitching noted Deep Tendon Reflexes: 2+ and symmetric bilateral biceps and brachioradialis. 4+ bilateral patellae with crossed adductor responses.  Plantars: Mute bilaterally  Cerebellar/Gait: Unable to assess    Lab Results: Basic Metabolic Panel: Recent Labs  Lab 12/14/22 1836 12/14/22 2236 12/15/22 0424 12/16/22 0858 12/17/22 0557 12/18/22 0632  NA  --  136 137 135 135 135  K  --  2.7* 2.9* 3.1* 2.8* 2.4*  CL  --  97* 98 96* 92* 95*  CO2  --  27 27 29  33* 28  GLUCOSE  --  76 75 88 105* 100*  BUN  --  <5* <5* <5* <5* <5*  CREATININE  --  0.57 0.45 0.52 0.49 0.52  CALCIUM  --  7.4* 7.7* 7.9* 7.7* 7.4*  MG 1.8  --   --  1.8  --  1.8    CBC: Recent Labs  Lab 12/14/22 1400 12/15/22 0424 12/16/22 0858 12/17/22 0557 12/18/22 0632  WBC 6.3 7.5 8.5 10.7* 14.7*  HGB 10.8* 10.4* 10.9* 11.8* 10.6*  HCT 33.5* 31.5* 33.4* 35.3* 31.2*  MCV 91.8 90.5 90.8 88.3 86.9  PLT 224 200 210 223 193    Cardiac Enzymes: No results for input(s): "CKTOTAL", "CKMB", "CKMBINDEX", "TROPONINI" in the last 168 hours.  Lipid Panel: No results for input(s): "CHOL", "TRIG", "HDL",  "CHOLHDL", "VLDL", "LDLCALC" in the last 168 hours.  Imaging: MR BRAIN WO CONTRAST  Result Date: 12/18/2022 CLINICAL DATA:  Altered mental status EXAM: MRI HEAD WITHOUT CONTRAST TECHNIQUE: Multiplanar, multiecho pulse sequences of the brain and surrounding structures were obtained without intravenous contrast. COMPARISON:  CT Head 12/14/22 FINDINGS: Brain: There is hyperintense signal on diffusion-weighted imaging in the bilateral parieto-occipital region with corresponding signal abnormality on the ADC map. There is also T2/FLAIR hyperintense signal abnormalities region. No hemorrhage. No extra-axial fluid collection. No hydrocephalus. Vascular: Normal flow voids. Skull and upper cervical spine: Normal marrow signal. Sinuses/Orbits: No middle ear or mastoid effusion. Mucosal thickening left maxillary sinus. Orbits are unremarkable. Other: None. IMPRESSION: Relatively symmetric bilateral parieto-occipital signal abnormality with regions of true diffusion restriction. Findings could be seen in the setting of PRES or severe hypoglycemic metabolic encephalopathy. Electronically Signed   By: Lorenza Cambridge M.D.   On: 12/18/2022 12:42     Assessment: 57 y.o. female with a PMHx including recent admission from 4/19 through 4/22 for metabolic encephalopathy in the setting of hypoglycemia, hypothyroidism, IBS,  depression, fibromyalgia, RA and chronic pain syndrome with chronic opioid dependence who presented to the hospital on 4/26 with FTT. She had not been eating or drinking and had been complaining of full body pains on initial ED assessment. Evaluation revealed findings most consistent with pancreatitis and colitis. She was also encephalopathic and seemed to be having intermittent hallucinations in the ED. CBG was 79 on initial assessment. She was severely hypokalemic. Vitals in the ED were BP 116/74, HR 94, RR 20, 100% O2 sat, 98.1 F. CT head was grossly stable. MRI reveals findings suggestive of PRES. She has no  prior history of seizures.  - Exam reveals an obtunded patient who will swat at examiner during noxious stimuli, but otherwise is without purposeful movements.  - MRI brain: Relatively symmetric bilateral parieto-occipital signal abnormality with regions of true diffusion restriction. Findings could be seen in the setting of PRES or severe hypoglycemic metabolic encephalopathy. The images have been personally reviewed and I agree with the official Radiology interpretation.  - Of note, no MRI brain has been performed in the recent past here or at other facilities based on review of documentation in CareEverywhere. It is suspected that her declining vision around Christmas may have been due to PRES at that time rather than temporal arteritis.  - Current ABX regimen: Metronidazole and ceftriaxone - On Dilaudid for pain. - Is on CIWA protocol  - Obtaining a vitamin D level. Deficiency of vitamin D can result in symptoms similar to fibromyalgia, in addition to diarrhea and cognitive changes. - She has been started on high-dose thiamine.  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 Clinically during the EEG patient was confused and had left eye jerking. The continuous slowing is most consistent with a severe diffuse encephalopathy.  Recommendations: - Ativan 2 mg IV x 1 STAT.  - Keppra 2000 mg IV load STAT - Continue Keppra scheduled dosing at 1000 mg IV BID.  - Transferring to [facility] for LTM EEG. Status epilepticus on EEG at [facility]. Slight occasional twitching in conjunction with diffuse persistent encephalopathy correlate clinically with EEG. MRI with bilateral occipital and posterior parietal lobe hyperintensity pattern most consistent with PRES, but BP not significantly elevated. Suspect possible severe HTN at home prior to BP normalizing spontaneously. Also has had severe hypoglycemia as etiology for recent  admission, which may account for the lesions (hypoglycemic brain injury). Loaded with Keppra.  - BP management.  - Close monitoring of blood glucose levels. Per mother she has no history of DM, but as noted above has been admitted recently for severe hypoglycemia. - Frequent neuro checks - Seizure precautions.   Electronically signed: Dr. Caryl Pina 12/18/2022, 1:13 PM

## 2022-12-18 NOTE — Procedures (Signed)
Patient Name: Abigail [name] [facility]  MRN: [id number]  Epilepsy Attending: [name]  Referring Physician/Provider: Alford Highland, MD  Date: 12/18/2022  Duration: 26.12 mins  Patient history: 57 year old female past medical history of anxiety, fibromyalgia, chronic pain, IBS, colitis. Presented with acute pancreatitis, colitis, acute metabolic encephalopathy and hypokalemia. EEG to evaluate for seizure  Level of alertness:  comatose  AEDs during EEG study: None  Technical aspects: This EEG study was done with scalp electrodes positioned according to the 10-20 International system of electrode placement. Electrical activity was reviewed with band pass filter of 1-70Hz , sensitivity of 7 uV/mm, display speed of 65mm/sec with a 60Hz  notched filter applied as appropriate. EEG data were recorded continuously and digitally stored.  Video monitoring was available and reviewed as appropriate.  Description: EEG showed continuous generalized polymorphic sharply contoured 3 to 6 Hz theta-delta slowing. Bilateral independent epileptiform discharges were noted in left and right occipital region with fluctuating frequency of 1-2hz  , at times rhythmic with evolution in morphology and frequency. Per EEG etch patient was confused and had left eye jerking. 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  Hyperventilation and photic stimulation were not performed.     ABNORMALITY - Focal motor status epilepticus,  left and right occipital region. 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 Clinically patient was confused and had left eye jerking. Additionally there was severe diffuse encephalopathy.  Dr. Otelia Limes and Dr Hilton Sinclair were notified.   Gannon Heinzman [name]

## 2022-12-18 NOTE — Assessment & Plan Note (Addendum)
Recent admission for hypoglycemia associated metabolic encephalopathy.  Continue to watch sugars closely.  D5 in IV fluids.

## 2022-12-18 NOTE — Progress Notes (Signed)
Notified this evening about EEG showing status epilepticus.  Case discussed with our neurologist here Dr. Otelia Limes and we ordered Ativan 2 mg and Keppra 2000 mg load.  I spoke with [facility] to transfer to [facility].  Spoke with medical team at [facility] that patient will need continuous EEG monitoring.  Went back to the bedside to examine patient and speak to the patient's mother.  Physical Exam Cardiovascular:     Rate and Rhythm: Normal rate and regular rhythm.     Heart sounds: Normal heart sounds, S1 normal and S2 normal.  Pulmonary:     Breath sounds: No decreased breath sounds, wheezing, rhonchi or rales.  Neurological:     Mental Status: She is lethargic.     Comments: Withdrew to Babinski bilaterally.  Unresponsive to sternal rub.     Plan to transfer to [facility] when bed available for continuous EEG monitoring.  Continue Keppra 1000 mg twice a day.  Another 45 minutes critical care time spent with the patient.  Patient critically ill with status epilepticus.  High risk for cardiopulmonary arrest.  Needs to be at center where we can do continuous EEG monitoring.  Case discussed with nursing staff and neurology.

## 2022-12-18 NOTE — Assessment & Plan Note (Signed)
Patient takes standing dose oxycodone as outpatient.  On as needed IV Dilaudid here.

## 2022-12-18 NOTE — Care Management Important Message (Signed)
Important Message  Patient Details  Name: Abigail [name] MRN: [id number] Date of Birth: 07/27/1966   Medicare Important Message Given:  Yes  Patient asleep upon time of visit, no family in room.  Copy of Medicare IM left on counter in room for reference.    Johnell Comings 12/18/2022, 11:51 AM

## 2022-12-18 NOTE — Discharge Instructions (Signed)
                  Intensive Outpatient Programs  [facility]    [facility] [address]     [address] [place],  [place]     [place], [place] [phone]      [phone]  [facility]   [facility]  (Inpatient and outpatient)  [phone] (Suboxone and Methadone) [address]           [phone]           [facility]    [facility] - Intensive Outpatient [address]     [address] [place], [place] [address]     [place], [place]  [phone]      [phone]  [facility] (Outpatient, Inpatient, [facility]  [facility] (Groups and [facility]) (insurance only) [phone]    [place], [place]          [phone]       [facility]    [facility] (for caregivers and family) [address]     [address] [place], [place]     [place], [place] [phone]      [phone]  Residential Treatment Programs  [facility]  [facility](2 years) Residential: 90 days)  [facility] [address]      [address] [place], [place]     [place], [place] [phone]      [phone] or [phone]  [facility]    [facility] [address]      [address] [place], [place]     [place], [place] [phone]      [phone]  [facility]   [facility] ([facility] [address]     [address] [place], [place] [address]     [place], [place] [phone]      [phone] Admissions: 8am-3pm M-F  [facility]: Residential Program (90 Days)              [facility]: [facility]  [place], [place]     [place], [place]  [phone] or [phone]    (Walk in Hours over the weekend or by referral)   [facility] Crisis: [facility]:[phone] (for crisis  response 24 hours a day) 

## 2022-12-18 NOTE — Progress Notes (Signed)
Eeg done 

## 2022-12-18 NOTE — Assessment & Plan Note (Signed)
On alcohol withdrawal protocol

## 2022-12-18 NOTE — Progress Notes (Signed)
Dr Renae Gloss made aware of critical K+ 2.4, see new orders

## 2022-12-18 NOTE — Progress Notes (Signed)
SLP Cancellation Note  Patient Details Name: [name] MRN: [id number] DOB: 01/20/1966   Cancelled treatment:       Reason Eval/Treat Not Completed: Medical issues which prohibited therapy;Patient's level of consciousness;Patient not medically ready (chart reviewed; consulted NSG) Discussed pt's status w/ both NSG and MD; reviewed chart notes indicating significant decline in mental status; opiod use and pancreatitis(see GI notes). CXR at admit: "without evidence of acute cardiopulmonary process.". MRI is ordered d/t mental status. Pt is on RA, afebrile.  Upon sternal rub, pt turn head away and moaned. She did not alert/awaken for safe assessment/po intake. NSG reported same when she attempted earlier.  ST services will hold on BSE at this time. Will f/u daily and complete BSE when pt is appropriate for evaluation. NSG agreed. Recommend oral care for hygiene and stimulation of swallowing when awake.     Jerilynn Som, MS, CCC-SLP Speech Language Pathologist Rehab Services; [facility] [phone] (ascom) Aaran Enberg 12/18/2022, 9:56 AM

## 2022-12-18 NOTE — Assessment & Plan Note (Addendum)
Synthroid once able to eat.  Recent TSH 1.573

## 2022-12-19 ENCOUNTER — Inpatient Hospital Stay (HOSPITAL_COMMUNITY): Payer: Medicare HMO

## 2022-12-19 ENCOUNTER — Encounter (HOSPITAL_COMMUNITY): Payer: Self-pay | Admitting: Internal Medicine

## 2022-12-19 DIAGNOSIS — M316 Other giant cell arteritis: Secondary | ICD-10-CM | POA: Insufficient documentation

## 2022-12-19 DIAGNOSIS — K861 Other chronic pancreatitis: Secondary | ICD-10-CM

## 2022-12-19 DIAGNOSIS — G40901 Epilepsy, unspecified, not intractable, with status epilepticus: Secondary | ICD-10-CM | POA: Diagnosis not present

## 2022-12-19 DIAGNOSIS — Z66 Do not resuscitate: Secondary | ICD-10-CM

## 2022-12-19 DIAGNOSIS — M069 Rheumatoid arthritis, unspecified: Secondary | ICD-10-CM

## 2022-12-19 DIAGNOSIS — E876 Hypokalemia: Secondary | ICD-10-CM

## 2022-12-19 DIAGNOSIS — Z79891 Long term (current) use of opiate analgesic: Secondary | ICD-10-CM

## 2022-12-19 DIAGNOSIS — I10 Essential (primary) hypertension: Secondary | ICD-10-CM | POA: Diagnosis not present

## 2022-12-19 DIAGNOSIS — E039 Hypothyroidism, unspecified: Secondary | ICD-10-CM

## 2022-12-19 LAB — COMPREHENSIVE METABOLIC PANEL
ALT: 22 U/L (ref 0–44)
AST: 45 U/L — ABNORMAL HIGH (ref 15–41)
Albumin: 1.8 g/dL — ABNORMAL LOW (ref 3.5–5.0)
Alkaline Phosphatase: 77 U/L (ref 38–126)
Anion gap: 12 (ref 5–15)
BUN: <5 mg/dL — ABNORMAL LOW (ref 6–20)
CO2: 25 mmol/L (ref 22–32)
Calcium: 7.5 mg/dL — ABNORMAL LOW (ref 8.9–10.3)
Chloride: 98 mmol/L (ref 98–111)
Creatinine, Ser: 0.54 mg/dL (ref 0.44–1.00)
GFR, Estimated: >60 mL/min (ref >60)
Glucose, Bld: 105 mg/dL — ABNORMAL HIGH (ref 70–99)
Potassium: 2.8 mmol/L — ABNORMAL LOW (ref 3.5–5.1)
Sodium: 135 mmol/L (ref 135–145)
Total Bilirubin: 0.9 mg/dL (ref 0.3–1.2)
Total Protein: 5 g/dL — ABNORMAL LOW (ref 6.5–8.1)

## 2022-12-19 LAB — PROTEIN AND GLUCOSE, CSF
Glucose, CSF: 71 mg/dL — ABNORMAL HIGH (ref 40–70)
Total  Protein, CSF: 93 mg/dL — ABNORMAL HIGH (ref 15–45)

## 2022-12-19 LAB — GLUCOSE, CAPILLARY
Glucose-Capillary: 88 mg/dL (ref 70–99)
Glucose-Capillary: 93 mg/dL (ref 70–99)
Glucose-Capillary: 112 mg/dL — ABNORMAL HIGH (ref 70–99)
Glucose-Capillary: 107 mg/dL — ABNORMAL HIGH (ref 70–99)

## 2022-12-19 LAB — HIV ANTIBODY (ROUTINE TESTING W REFLEX): HIV Screen 4th Generation wRfx: NONREACTIVE

## 2022-12-19 LAB — CSF CELL COUNT WITH DIFFERENTIAL
RBC Count, CSF: 235 /mm3 — ABNORMAL HIGH
Tube #: 1
WBC, CSF: 1 /mm3 (ref 0–5)

## 2022-12-19 LAB — SEDIMENTATION RATE: Sed Rate: 36 mm/hr — ABNORMAL HIGH (ref 0–22)

## 2022-12-19 LAB — PROCALCITONIN: Procalcitonin: 1.85 ng/mL

## 2022-12-19 LAB — C-REACTIVE PROTEIN: CRP: 3.8 mg/dL — ABNORMAL HIGH (ref <1.0)

## 2022-12-19 LAB — RPR: RPR Ser Ql: NONREACTIVE

## 2022-12-19 LAB — CSF CULTURE W GRAM STAIN: Gram Stain: NONE SEEN

## 2022-12-19 LAB — AMMONIA: Ammonia: 24 umol/L (ref 9–35)

## 2022-12-19 LAB — LIPASE, BLOOD: Lipase: 110 U/L — ABNORMAL HIGH (ref 11–51)

## 2022-12-19 LAB — MAGNESIUM: Magnesium: 2 mg/dL (ref 1.7–2.4)

## 2022-12-19 MED ORDER — SODIUM CHLORIDE 0.9 % IV SOLN
10.0000 mg/kg | INTRAVENOUS | Status: AC
  Administered 2022-12-19: 875 mg via INTRAVENOUS
  Filled 2022-12-19: qty 17.5

## 2022-12-19 MED ORDER — ONDANSETRON HCL 4 MG/2ML IJ SOLN: 4.0000 mg | Freq: Four times a day (QID) | INTRAMUSCULAR | Status: DC | PRN

## 2022-12-19 MED ORDER — SODIUM CHLORIDE 0.9 % IV SOLN
2500.0000 mg | Freq: Once | INTRAVENOUS | Status: AC
  Administered 2022-12-19: 2500 mg via INTRAVENOUS
  Filled 2022-12-19: qty 25

## 2022-12-19 MED ORDER — SODIUM CHLORIDE 0.9 % IV SOLN
2.0000 g | INTRAVENOUS | Status: DC
  Administered 2022-12-19 – 2022-12-20 (×2): 2 g via INTRAVENOUS
  Filled 2022-12-19 (×2): qty 20

## 2022-12-19 MED ORDER — MORPHINE SULFATE (PF) 2 MG/ML IV SOLN
2.0000 mg | INTRAVENOUS | Status: DC | PRN
  Administered 2022-12-19 – 2022-12-26 (×24): 2 mg via INTRAVENOUS
  Filled 2022-12-19 (×25): qty 1

## 2022-12-19 MED ORDER — THIAMINE HCL 100 MG/ML IJ SOLN: 100.0000 mg | Freq: Every day | INTRAMUSCULAR | Status: DC

## 2022-12-19 MED ORDER — LEVETIRACETAM IN NACL 1500 MG/100ML IV SOLN
1500.0000 mg | Freq: Two times a day (BID) | INTRAVENOUS | Status: DC
  Administered 2022-12-19 – 2022-12-20 (×3): 1500 mg via INTRAVENOUS
  Filled 2022-12-19 (×3): qty 100

## 2022-12-19 MED ORDER — THIAMINE HCL 100 MG/ML IJ SOLN
250.0000 mg | Freq: Every day | INTRAVENOUS | Status: DC
  Administered 2022-12-21 – 2022-12-25 (×5): 250 mg via INTRAVENOUS
  Filled 2022-12-19 (×8): qty 2.5

## 2022-12-19 MED ORDER — MIDAZOLAM HCL 2 MG/2ML IJ SOLN
2.0000 mg | INTRAMUSCULAR | Status: DC | PRN
  Filled 2022-12-19: qty 2

## 2022-12-19 MED ORDER — DEXTROSE IN LACTATED RINGERS 5 % IV SOLN: INTRAVENOUS | Status: AC

## 2022-12-19 MED ORDER — FENTANYL 25 MCG/HR TD PT72
1.0000 | MEDICATED_PATCH | TRANSDERMAL | Status: DC
  Administered 2022-12-19: 1 via TRANSDERMAL
  Filled 2022-12-19: qty 1

## 2022-12-19 MED ORDER — MAGNESIUM SULFATE 2 GM/50ML IV SOLN
2.0000 g | Freq: Once | INTRAVENOUS | Status: AC
  Administered 2022-12-19: 2 g via INTRAVENOUS
  Filled 2022-12-19: qty 50

## 2022-12-19 MED ORDER — METRONIDAZOLE 500 MG/100ML IV SOLN
500.0000 mg | Freq: Two times a day (BID) | INTRAVENOUS | Status: DC
  Administered 2022-12-19 – 2022-12-21 (×5): 500 mg via INTRAVENOUS
  Filled 2022-12-19 (×5): qty 100

## 2022-12-19 MED ORDER — PHENYTOIN SODIUM 50 MG/ML IJ SOLN
50.0000 mg | Freq: Three times a day (TID) | INTRAMUSCULAR | Status: DC
  Administered 2022-12-19 – 2022-12-21 (×5): 50 mg via INTRAVENOUS
  Filled 2022-12-19 (×7): qty 1

## 2022-12-19 MED ORDER — INSULIN ASPART 100 UNIT/ML IJ SOLN
0.0000 [IU] | Freq: Three times a day (TID) | INTRAMUSCULAR | Status: DC
  Administered 2022-12-21 – 2022-12-22 (×3): 2 [IU] via SUBCUTANEOUS
  Administered 2022-12-23: 1 [IU] via SUBCUTANEOUS
  Administered 2022-12-23: 5 [IU] via SUBCUTANEOUS
  Administered 2022-12-24: 1 [IU] via SUBCUTANEOUS
  Administered 2022-12-24: 4 [IU] via SUBCUTANEOUS
  Administered 2022-12-25: 2 [IU] via SUBCUTANEOUS

## 2022-12-19 MED ORDER — ACETAMINOPHEN 650 MG RE SUPP: 650.0000 mg | RECTAL | Status: DC | PRN

## 2022-12-19 MED ORDER — LIDOCAINE HCL (PF) 1 % IJ SOLN
5.0000 mL | Freq: Once | INTRAMUSCULAR | Status: AC
  Administered 2022-12-19: 5 mL via INTRADERMAL

## 2022-12-19 MED ORDER — POTASSIUM CHLORIDE 10 MEQ/100ML IV SOLN
10.0000 meq | INTRAVENOUS | Status: AC
  Administered 2022-12-19 (×5): 10 meq via INTRAVENOUS
  Filled 2022-12-19 (×5): qty 100

## 2022-12-19 MED ORDER — THIAMINE HCL 100 MG/ML IJ SOLN
500.0000 mg | Freq: Three times a day (TID) | INTRAVENOUS | Status: AC
  Administered 2022-12-19 – 2022-12-20 (×6): 500 mg via INTRAVENOUS
  Filled 2022-12-19 (×6): qty 5

## 2022-12-19 NOTE — Assessment & Plan Note (Signed)
Admit to telemetry bed. Neurology to see and manage AEDs. Keep NPO. Start IVF with d5 LR @ 50 ml/hr to prevent hypoglycemia.

## 2022-12-19 NOTE — Progress Notes (Signed)
SLP Cancellation Note  Patient Details Name: Abigail [name] [facility] MRN: [id number] DOB: [birth year]   Cancelled treatment:       Reason Eval/Treat Not Completed: Patient's level of consciousness. RN reports patient has had moments of wakefulness but not alert. SLP will f/u next date.   Angela Nevin, MA, CCC-SLP Speech Therapy

## 2022-12-19 NOTE — Assessment & Plan Note (Signed)
Chronic. 

## 2022-12-19 NOTE — Assessment & Plan Note (Signed)
Repeat BMP. Had repletion of K via IV runs of Kcl. Reportedly had been on hctz prior to admission.

## 2022-12-19 NOTE — Procedures (Addendum)
Patient Name: A[facility]ail [name]  MRN: [id number]  Epilepsy Attending: [name]  Referring Physician/Provider: Gordy Councilman, MD  Duration: 12/19/2022 0027 to 12/20/2022 0027   Patient history: 57 year old female past medical history of anxiety, fibromyalgia, chronic pain, IBS, colitis. Presented with acute pancreatitis, colitis, acute metabolic encephalopathy and hypokalemia. EEG to evaluate for seizure   Level of alertness: awake, asleep   AEDs during EEG study: LEV, PHT   Technical aspects: This EEG study was done with scalp electrodes positioned according to the 10-20 International system of electrode placement. Electrical activity was reviewed with band pass filter of 1-70Hz , sensitivity of 7 uV/mm, display speed of 65mm/sec with a 60Hz  notched filter applied as appropriate. EEG data were recorded continuously and digitally stored.  Video monitoring was available and reviewed as appropriate.   Description: No clear posterior dominant was being seen. Sleep was characterized by sleep symptoms (12 to 14 Hz), maximal frontocentral region. EEG showed continuous generalized polymorphic sharply contoured 3 to 6 Hz theta-delta slowing. Bilateral independent epileptiform discharges with overriding fast activity were noted in left and right occipital region at 0.5-1hz .   Seizures without clinical signs were noted arising from left occipital region.  During seizure, EEG showed 10 to 11 Hz sharply contoured alpha activity admixed with spikes in left occipital region which then involved left centro- temporo- parietal region and evolved to 2 to 3 Hz delta slowing. 31 seizures were noted, lasting about 30 seconds to 1 minute each.  Seizures without clinical signs were noted arising from right occipital region.  During seizure, EEG showed 10 to 11 Hz sharply contoured alpha activity admixed with spikes in right occipital region which then involved left centro- temporo- parietal region followed  by left parieto-occipital region and evolved to 2 to 3 Hz delta slowing . 8 seizures were noted, lasting about 30 seconds to 1 minute each.  Hyperventilation and photic stimulation were not performed.      ABNORMALITY -Seizure without clinical signs,  left occipital region -Bilateral independent periodic discharges with overriding fast activity, left and right occipital region   - Continuous slow, generalized   IMPRESSION: This study showed 31 seizures without clinical signs arising from left occipital region and 8 seizures without clinical signs arising from right occipital region, lasting for 30 seconds to 1 minute each.  Additionally there is evidence of independent epileptogenicity with increased risk of seizures in left and right occipital region.  Lastly there is moderate to severe diffuse encephalopathy.           Abigail [name]

## 2022-12-19 NOTE — Assessment & Plan Note (Signed)
Was getting oxycodone 20 mg 5 times a day.  Will need to change over to fentanyl patch since she will be npo.

## 2022-12-19 NOTE — Procedures (Signed)
Successful LP from L2-L3 No complications See PACS for full report.  Brayton El PA-C Interventional Radiology 12/19/2022 2:36 PM

## 2022-12-19 NOTE — Progress Notes (Addendum)
Subjective: Mother and other family member at bedside.  Mother states patient has had issues with headache, left vision loss since December.  States she was diagnosed with giant cell arteritis.  Biopsy was performed but was negative but she was given steroids.  Denies prior history of seizures but states she has noticed patient having tremor-like movements in her arms over the last few months.  States patient has not been eating much due to significant nausea.  ROS: Unable to obtain due to poor mental status  Examination  Vital signs in last 24 hours: Temp:  [97.6 F (36.4 C)-98.9 F (37.2 C)] (P) 97.6 F (36.4 C) (05/01 1204) Pulse Rate:  [81-97] (P) 94 (05/01 1204) Resp:  [17-20] (P) 19 (05/01 1204) BP: (129-166)/(74-100) (P) 135/88 (05/01 1204) SpO2:  [93 %-99 %] (P) 98 % (05/01 1204) Weight:  [87.5 kg] 87.5 kg (05/01 0333)  General: lying in bed, NAD Neuro: Lethargic, does open eyes and tell me her name and that she is in the hospital, states it is April 2024, able to do simple math, follows simple commands,  PERRLA, able to count fingers accurately but unable to cross midline to look to the left side, antigravity strength in all 4 extremities  Basic Metabolic Panel: Recent Labs  Lab 12/14/22 1836 12/14/22 2236 12/15/22 0424 12/16/22 0858 12/17/22 0557 12/18/22 0632 12/19/22 0300  NA  --    < > 137 135 135 135 135  K  --    < > 2.9* 3.1* 2.8* 2.4* 2.8*  CL  --    < > 98 96* 92* 95* 98  CO2  --    < > 27 29 33* 28 25  GLUCOSE  --    < > 75 88 105* 100* 105*  BUN  --    < > <5* <5* <5* <5* <5*  CREATININE  --    < > 0.45 0.52 0.49 0.52 0.54  CALCIUM  --    < > 7.7* 7.9* 7.7* 7.4* 7.5*  MG 1.8  --   --  1.8  --  1.8 2.0   < > = values in this interval not displayed.    CBC: Recent Labs  Lab 12/14/22 1400 12/15/22 0424 12/16/22 0858 12/17/22 0557 12/18/22 0632  WBC 6.3 7.5 8.5 10.7* 14.7*  HGB 10.8* 10.4* 10.9* 11.8* 10.6*  HCT 33.5* 31.5* 33.4* 35.3* 31.2*  MCV  91.8 90.5 90.8 88.3 86.9  PLT 224 200 210 223 193     Coagulation Studies: No results for input(s): "LABPROT", "INR" in the last 72 hours.  Imaging MRI brain without contrast 12/18/2022:Relatively symmetric bilateral parieto-occipital signal abnormality with regions of true diffusion restriction. Findings could be seen  in the setting of PRES or severe hypoglycemic metabolic encephalopathy.     ASSESSMENT AND PLAN: 57 year old female presented to [facility] hospital on 12/14/2022 with altered mental status.  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  Therefore she was transferred to [facility] for long-term EEG.  Focal convulsive status epilepticus Focal seizures -Unclear etiology of focal status epilepticus.  Could be secondary to autoimmune etiology (mother states issues with headache and vision since December and has received intermittent steroids), could be secondary to PRES, significant hyperglycemia  Recommendations -Hold give IV fosphenytoin 875mg  PE once -Continue Keppra 1500 mg twice daily -Will place order for fluoroscopy guided lumbar puncture to look for autoimmune etiology.  Patient is  n.p.o. -Continuous EEG was in addition medications -If seizures persist, will start maintenance Dilantin -If frequent seizures may need to load with Vimpat -May need repeat MRI in a few weeks to look for improvement in imaging findings -Mother also expressed concern the patient has not had any p.o. intake in over a week.  Will let hospitalist know to help with NG tube for swallow evaluation after lumbar puncture is performed -Continue seizure precautions As needed IV Versed for clinical seizures -Management of rest of comorbidities per primary team -Discussed plan with Dr. Blake Divine via secure chat and family at bedside  Addendum - Reviewed CSF results, ordered [facility] CSF and serum autoimmune panel.   - ordered Dilantin 50mg  Q8h   CRITICAL CARE Performed by: [name]   Total critical care time: 38 minutes  Critical care time was exclusive of separately billable procedures and treating other patients.  Critical care was necessary to treat or prevent imminent or life-threatening deterioration.  Critical care was time spent personally by me on the following activities: development of treatment plan with patient and/or surrogate as well as nursing, discussions with consultants, evaluation of patient's response to treatment, examination of patient, obtaining history from patient or surrogate, ordering and performing treatments and interventions, ordering and review of laboratory studies, ordering and review of radiographic studies, pulse oximetry and re-evaluation of patient's condition.     Lindie Spruce Epilepsy [facility] For questions after 5pm please refer to AMION to reach the Neurologist on call

## 2022-12-19 NOTE — H&P (Signed)
History and Physical    Abigail [name] [facility] ZOX:[id number] DOB: 1966/08/17 DOA: 12/18/2022  DOS: the patient was seen and examined on 12/18/2022  PCP: Alease Medina, MD   Patient coming from:  [facility]   I have personally briefly reviewed patient's old medical records in [place] [facility]  CC: status epilepticus HPI: 57 year old Caucasian female history of rheumatoid arthritis, giant cell arteritis on chronic prednisone, chronic pain syndrome on chronic opiates through [facility] in [facility], chronic pancreatitis, hypothyroidism, depression who presents as a transfer from [facility] where she was admitted.  She was found to be in focal status epilepticus during routine EEG testing today.  She was transferred to [facility] for long-term EEG.  Patient unable to give history or review of systems.  No family is available.  History obtained for the patient's chart.  She was admitted on 12/14/2022 at [facility].  Her chief complaint was failure to thrive.  Reportedly by EMS she had not been eating or drinking.  She was encephalopathic on admission.  Her initial abdomen pelvis CAT scan showed peripancreatic fat stranding.  There were several cysts in the pancreas itself.  This is similar in description from her CT abdomen pelvis she had on August 26, 2022 where she had diffuse edema of the pancreas with pancreatic soft tissue stranding.  Her lipase in January 2024 was 120.  When she was admitted at [facility] on 26 April, it was 53.  CT scan also showed some wall thickening of the colon along the right side with some stranding.  Patient's white count was not elevated.  It was 6.3.  Over the course the next 3 days, the patient continued to be encephalopathic.  Initially thought this was psychiatric and she was seen by psychiatry.  Due to continued encephalopathy, neurology was consulted.  Routine EEG testing showed that she had focal motor status  epilepticus arising from the left and right occipital regions.  Patient was transferred to [facility] after an MRI was obtained that showed hyperintense signals on diffusion-weighted imaging in the bilateral parietal occipital region.  Patient had not been excessively hypertensive during the hospitalization and patient was euglycemic or slightly hyperglycemic (highest blood sugar was 155).  Radiologist suggested that the patient symmetric bilateral parietal occipital signaling could be due to PRES or severe hypoglycemic encephalopathy.  There is no excessive hypertension during her hospitalization at [facility].     Review of Systems:  Review of Systems  Unable to perform ROS: Other  encephalopathic  Past Medical History:  Diagnosis Date   Anxiety    Arthritis    Back pain    Basal cell carcinoma 10/04/2021   R axilla - needs excised 11/28/21   Basal cell carcinoma 10/04/2021   L antecubital excised 11/14/21   Diarrhea 11/12/2016   Fibromyalgia    Generalized abdominal pain 11/12/2016   H. pylori infection    Hyperlipidemia    IBS (irritable bowel syndrome)    Infectious colitis 04/29/2016   Migraines    Moderate dehydration 04/29/2016   Muscle pain    Opioid overdose (HCC) 12/07/2022   Reflux    Unexplained weight loss 11/12/2016    Past Surgical History:  Procedure Laterality Date   ABDOMINAL HYSTERECTOMY     APPENDECTOMY  2009   C5 FUSION     C6 FUSION     C7 FUSION     COLONOSCOPY  02/2006   COLONOSCOPY WITH PROPOFOL N/A 12/25/2016  Procedure: COLONOSCOPY WITH PROPOFOL;  Surgeon: Earline Mayotte, MD;  Location: [facility] ENDOSCOPY;  Service: Endoscopy;  Laterality: N/A;   ESOPHAGOGASTRODUODENOSCOPY (EGD) WITH PROPOFOL N/A 12/25/2016   Procedure: ESOPHAGOGASTRODUODENOSCOPY (EGD) WITH PROPOFOL;  Surgeon: Earline Mayotte, MD;  Location: [facility] ENDOSCOPY;  Service: Endoscopy;  Laterality: N/A;   FOOT SURGERY     HIP ARTHROPLASTY     L4 FUSION     L5 FUSION     S1  FUSION       reports that she has been smoking cigarettes. She has been smoking an average of 1 pack per day. She has never used smokeless tobacco. She reports current alcohol use. She reports that she does not use drugs.  Allergies  Allergen Reactions   Nsaids Hives   Tapentadol Other (See Comments), Rash and Swelling    Made her deathly sick   Codeine    Darvocet [Propoxyphene N-Acetaminophen]    Sulfa Antibiotics    Tape     No family history on file.  Prior to Admission medications   Medication Sig Start Date End Date Taking? Authorizing Provider  acetaminophen (TYLENOL) 650 MG suppository Place 1 suppository (650 mg total) rectally every 6 (six) hours as needed for fever. 12/18/22   Alford Highland, MD  cefTRIAXone 2 g in sodium chloride 0.9 % 100 mL Inject 2 g into the vein daily. 12/19/22   Alford Highland, MD  enoxaparin (LOVENOX) 40 MG/0.4ML injection Inject 0.4 mLs (40 mg total) into the skin daily. 12/18/22   Alford Highland, MD  folic acid 1 mg in sodium chloride 0.9 % 50 mL Inject 1 mg into the vein daily. 12/19/22   Alford Highland, MD  HYDROmorphone (DILAUDID) 1 MG/ML injection Inject 0.5 mLs (0.5 mg total) into the vein every 4 (four) hours as needed for severe pain. 12/18/22   Alford Highland, MD  levETIRAcetam (KEPPRA) 1000 MG/100ML SOLN Inject 100 mLs (1,000 mg total) into the vein every 12 (twelve) hours. 12/19/22   Alford Highland, MD  LORazepam (ATIVAN) 2 MG/ML injection Inject 1 mL (2 mg total) into the vein every 4 (four) hours as needed for seizure. 12/18/22   Alford Highland, MD  metroNIDAZOLE (FLAGYL) 500 MG/100ML Inject 100 mLs (500 mg total) into the vein every 12 (twelve) hours. 12/18/22   Alford Highland, MD  ondansetron (ZOFRAN) 4 MG/2ML SOLN injection Inject 2 mLs (4 mg total) into the vein every 6 (six) hours as needed for nausea. 12/18/22   Alford Highland, MD  pantoprazole (PROTONIX) 40 MG injection Inject 40 mg into the vein daily. 12/19/22   Alford Highland, MD  Pot Cl in D5W Lact Ringers (DEXTROSE 5% LACTATED RINGERS WITH KCL 20 MEQ/L) 20 MEQ/L infusion Inject 75 mL/hr into the vein continuous. 12/18/22   Alford Highland, MD  sodium chloride 0.9 % SOLN 50 mL with thiamine 100 MG/ML SOLN 500 mg Inject 500 mg into the vein every 8 (eight) hours. 12/18/22   Alford Highland, MD    Physical Exam: Vitals:   12/18/22 2336  BP: 135/74  Pulse: 95  Resp: 20  Temp: 98.9 F (37.2 C)  TempSrc: Axillary  SpO2: 96%    Physical Exam Vitals and nursing note reviewed.  Constitutional:      Comments: Appears chronically ill  HENT:     Head: Normocephalic and atraumatic.  Eyes:     General: No scleral icterus.    Comments: Downward gaze preference  Cardiovascular:     Rate and Rhythm: Normal rate and  regular rhythm.     Pulses: Normal pulses.  Pulmonary:     Effort: Pulmonary effort is normal.     Breath sounds: Normal breath sounds.  Abdominal:     General: Bowel sounds are normal. There is no distension.     Palpations: Abdomen is soft.     Tenderness: There is no abdominal tenderness. There is no guarding.  Musculoskeletal:     Right lower leg: No edema.     Left lower leg: No edema.  Skin:    General: Skin is warm and dry.  Neurological:     Comments: Unable to follow commands Moans some.      Labs on Admission: I have personally reviewed following labs and imaging studies  CBC: Recent Labs  Lab 12/14/22 1400 12/15/22 0424 12/16/22 0858 12/17/22 0557 12/18/22 0632  WBC 6.3 7.5 8.5 10.7* 14.7*  HGB 10.8* 10.4* 10.9* 11.8* 10.6*  HCT 33.5* 31.5* 33.4* 35.3* 31.2*  MCV 91.8 90.5 90.8 88.3 86.9  PLT 224 200 210 223 193   Basic Metabolic Panel: Recent Labs  Lab 12/14/22 1836 12/14/22 2236 12/15/22 0424 12/16/22 0858 12/17/22 0557 12/18/22 0632  NA  --  136 137 135 135 135  K  --  2.7* 2.9* 3.1* 2.8* 2.4*  CL  --  97* 98 96* 92* 95*  CO2  --  27 27 29  33* 28  GLUCOSE  --  76 75 88 105* 100*  BUN  --  <5*  <5* <5* <5* <5*  CREATININE  --  0.57 0.45 0.52 0.49 0.52  CALCIUM  --  7.4* 7.7* 7.9* 7.7* 7.4*  MG 1.8  --   --  1.8  --  1.8   GFR: Estimated Creatinine Clearance: 94.4 mL/min (by C-G formula based on SCr of 0.52 mg/dL). Liver Function Tests: Recent Labs  Lab 12/14/22 1422 12/15/22 0424 12/16/22 0858 12/18/22 0632  AST 64* 53* 37 62*  ALT 22 22 20 22   ALKPHOS 96 102 104 80  BILITOT 1.2 1.1 0.8 1.2  PROT 5.0* 4.9* 5.5* 5.0*  ALBUMIN 1.8* 1.9* 2.1* 1.9*   Recent Labs  Lab 12/14/22 1422 12/17/22 0557  LIPASE 53* 67*   Recent Labs  Lab 12/14/22 2131 12/17/22 1455  AMMONIA <10 33   Cardiac Enzymes: Recent Labs  Lab 12/14/22 1400 12/14/22 1845  TROPONINIHS 5 4   CBG: Recent Labs  Lab 12/17/22 1647 12/17/22 1952 12/18/22 1419 12/18/22 1725 12/18/22 2217  GLUCAP 144* 155* 89 90 93   Anemia Panel: Recent Labs    12/17/22 0557 12/17/22 1455  VITAMINB12  --  1,239*  FOLATE 8.8  --    Urine analysis:    Component Value Date/Time   COLORURINE YELLOW (A) 12/07/2022 2246   APPEARANCEUR CLEAR (A) 12/07/2022 2246   APPEARANCEUR Clear 05/24/2014 1903   LABSPEC 1.003 (L) 12/07/2022 2246   LABSPEC 1.015 05/24/2014 1903   PHURINE 6.0 12/07/2022 2246   GLUCOSEU 50 (A) 12/07/2022 2246   GLUCOSEU Negative 05/24/2014 1903   HGBUR NEGATIVE 12/07/2022 2246   BILIRUBINUR NEGATIVE 12/07/2022 2246   BILIRUBINUR Negative 05/24/2014 1903   KETONESUR NEGATIVE 12/07/2022 2246   PROTEINUR NEGATIVE 12/07/2022 2246   NITRITE NEGATIVE 12/07/2022 2246   LEUKOCYTESUR NEGATIVE 12/07/2022 2246   LEUKOCYTESUR Negative 05/24/2014 1903    Radiological Exams on Admission: I have personally reviewed images EEG adult  Result Date: 12/18/2022 [name], MD     12/18/2022  5:27 PM Patient Name: Abigail [name] [facility]  [place] MRN: [id number] Epilepsy Attending: [name] Referring Physician/Provider: Alford Highland, MD Date: 12/18/2022 Duration: 26.12 mins Patient history:  57 year old female past medical history of anxiety, fibromyalgia, chronic pain, IBS, colitis. Presented with acute pancreatitis, colitis, acute metabolic encephalopathy and hypokalemia. EEG to evaluate for seizure Level of alertness:  comatose AEDs during EEG study: None Technical aspects: This EEG study was done with scalp electrodes positioned according to the 10-20 International system of electrode placement. Electrical activity was reviewed with band pass filter of 1-70Hz , sensitivity of 7 uV/mm, display speed of 10mm/sec with a 60Hz  notched filter applied as appropriate. EEG data were recorded continuously and digitally stored.  Video monitoring was available and reviewed as appropriate. Description: EEG showed continuous generalized polymorphic sharply contoured 3 to 6 Hz theta-delta slowing. Bilateral independent epileptiform discharges were noted in left and right occipital region with fluctuating frequency of 1-2hz  , at times rhythmic with evolution in morphology and frequency. Per EEG etch patient was confused and had left eye jerking. 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  Hyperventilation and photic stimulation were not performed.   ABNORMALITY - Focal motor status epilepticus,  left and right occipital region. 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 Clinically patient was confused and had left eye jerking. Additionally there was severe diffuse encephalopathy. Dr. Otelia Limes and Dr Hilton Sinclair were notified. [name]   MR BRAIN WO CONTRAST  Result Date: 12/18/2022 CLINICAL DATA:  Altered mental status EXAM: MRI HEAD WITHOUT CONTRAST TECHNIQUE: Multiplanar, multiecho pulse sequences of the brain and surrounding structures were obtained without intravenous contrast. COMPARISON:  CT Head 12/14/22 FINDINGS: Brain: There is hyperintense signal on  diffusion-weighted imaging in the bilateral parieto-occipital region with corresponding signal abnormality on the ADC map. There is also T2/FLAIR hyperintense signal abnormalities region. No hemorrhage. No extra-axial fluid collection. No hydrocephalus. Vascular: Normal flow voids. Skull and upper cervical spine: Normal marrow signal. Sinuses/Orbits: No middle ear or mastoid effusion. Mucosal thickening left maxillary sinus. Orbits are unremarkable. Other: None. IMPRESSION: Relatively symmetric bilateral parieto-occipital signal abnormality with regions of true diffusion restriction. Findings could be seen in the setting of PRES or severe hypoglycemic metabolic encephalopathy. Electronically Signed   By: Lorenza Cambridge M.D.   On: 12/18/2022 12:42    EKG: My personal interpretation of EKG shows: no EKG to review  Assessment/Plan Principal Problem:   Status epilepticus (HCC) Active Problems:   Chronic prescription opiate use - thru [facility] in [place], [place]   RA (rheumatoid arthritis) (HCC)   Essential hypertension   Hypothyroidism   Hypokalemia   Chronic pancreatitis (HCC)   GCA (giant cell arteritis) (HCC)   DNR (do not resuscitate)    Assessment and Plan: * Status epilepticus (HCC) Admit to telemetry bed. Neurology to see and manage AEDs. Keep NPO. Start IVF with d5 LR @ 50 ml/hr to prevent hypoglycemia.  Chronic pancreatitis (HCC) Chronic I'm guessing. Pt has had multiple CT abd that show fluid around pancreas but minimal/normal lipase levels. Has been seen by [facility]. Was suppose to get MRCP at some point.  Hypokalemia Repeat BMP. Had repletion of K via IV runs of Kcl. Reportedly had been on hctz prior to admission.  Hypothyroidism Stable.  Essential hypertension Prn IV labetalol. May need clonidine patch if she sustains SBP >160  RA (rheumatoid arthritis) (HCC) Chronic.  Chronic prescription opiate use - thru [facility] in [place],  [place] Was getting oxycodone 20 mg 5 times a day.   Will need to change over to fentanyl patch since she will be npo.  GCA (giant cell arteritis) (HCC) On chronic prednisone via rheumatology. Will change to solumedrol while NPO.   DVT prophylaxis: SCDs Code Status: DNR/DNI(Do NOT Intubate) Family Communication: no family at bedside  Disposition Plan: return home  Consults called: neurology  Admission status: Inpatient, Telemetry bed   Carollee Herter, DO [facility] 12/19/2022, 1:01 AM

## 2022-12-19 NOTE — Assessment & Plan Note (Signed)
Prn IV labetalol. May need clonidine patch if she sustains SBP >160

## 2022-12-19 NOTE — Assessment & Plan Note (Signed)
On chronic prednisone via rheumatology. Will change to solumedrol while NPO.

## 2022-12-19 NOTE — Progress Notes (Signed)
  Transition of Care [facility]) Screening Note   Patient Details  Name: Jaylanie Boschee [facility] Date of Birth: 12-01-1965   Transition of Care [facility]) CM/SW Contact:    Baldemar Lenis, LCSW Phone Number: 12/19/2022, 3:11 PM    Transition of Care Department [facility]) has reviewed patient, medical workup ongoing. We will continue to monitor patient advancement through interdisciplinary progression rounds. If new patient transition needs arise, please place a TOC consult.

## 2022-12-19 NOTE — Progress Notes (Signed)
LTM EEG hooked up and running - no initial skin breakdown - push button tested - [facility] monitoring.  

## 2022-12-19 NOTE — Progress Notes (Signed)
Report given to Reece Levy, @ 2200. [facility] arrived @ 2230. Patient left facilty via stretcher @ 2245. Hospitalist, MD updated throughout transfer process. Mother, Britta Mccreedy and son, Harvie Heck notified of patient transfer with room information given.

## 2022-12-19 NOTE — Progress Notes (Signed)
[facility]                                                                               [employer], is a 57 y.o. female, DOB - 07-04-1966, ZOX:[id number] Admit date - 12/18/2022    Outpatient Primary MD for the patient is [name], [name], MD  LOS - 1  days    Brief summary   57 year old Caucasian female history of rheumatoid arthritis, giant cell arteritis on chronic prednisone, chronic pain syndrome on chronic opiates through [facility] in [facility], chronic pancreatitis, hypothyroidism, depression who presents as a transfer from [facility] where she was admitted. She was found to be in focal status epilepticus during routine EEG testing today. She was transferred to [facility] for long-term EEG.    Assessment & Plan    Assessment and Plan: * Status epilepticus (HCC) Currently on LTM eeg.  Neurology on board .  Loading with  fosphenytoin, and keppra  followed by Keppra 1500 mg BID, dilantin 50 mg every 8 hours.  Underwent FL guided LP by IR. Labs sent as per Dr Melynda Ripple.   Acute encephalopathy due to status epilepticus: - still very lethargic, not following commands.  SLP eval unable to assess her due to mental status.    Chronic pancreatitis (HCC) Recommend outpatient follow up with GI. Will probably need MRCP at some point.   Hypokalemia Replaced.   Hypothyroidism Stable.  Essential hypertension BP parameters are optimal.   RA (rheumatoid arthritis) (HCC) Chronic.  Chronic prescription opiate use - thru [facility] in [place], [place] Was getting oxycodone 20 mg 5 times a day.   Currently on IV morphine 2 mg PRN every 2 hours.   GCA (giant cell arteritis) (HCC) On chronic prednisone  At home.   Nutrition:  As per family patient hasn't had any food for over a week.  Will get cortrak in am and start her on tube feeds.       Estimated body mass index is 28.49 kg/m as calculated from the following:   Height as of an  earlier encounter on 12/18/22: 5\' 9"  (1.753 m).   Weight as of this encounter: 87.5 kg.  Code Status: full code.  DVT Prophylaxis:  SCDs Start: 12/19/22 0203   Level of Care: Level of care: Progressive Family Communication: none at bedside.   Disposition Plan:     Remains inpatient appropriate:  further eval   Procedures:  FL guided LP on 5/1  Consultants:   Neurology.   Antimicrobials:   Anti-infectives (From admission, onward)    Start     Dose/Rate Route Frequency Ordered Stop   12/19/22 1800  cefTRIAXone (ROCEPHIN) 2 g in sodium chloride 0.9 % 100 mL IVPB        2 g 200 mL/hr over 30 Minutes Intravenous Every 24 hours 12/19/22 0450     12/19/22 1000  metroNIDAZOLE (FLAGYL) IVPB 500 mg        500 mg 100 mL/hr over 60 Minutes Intravenous 2 times daily 12/19/22 0450          Medications  Scheduled Meds:  insulin aspart  0-6 Units Subcutaneous TID WC   [  START ON 12/27/2022] thiamine (VITAMIN B1) injection  100 mg Intravenous Daily   Continuous Infusions:  cefTRIAXone (ROCEPHIN)  IV     dextrose 5% lactated ringers 50 mL/hr at 12/19/22 1315   levETIRAcetam Stopped (12/19/22 1149)   metronidazole Stopped (12/19/22 1022)   thiamine (VITAMIN B1) injection Stopped (12/19/22 0354)   Followed by   Melene Muller ON 12/21/2022] thiamine (VITAMIN B1) injection     PRN Meds:.acetaminophen, midazolam, morphine injection    Subjective:   Abigail [name] was seen and examined today.  On LTM EEG.     Objective:   Vitals:   12/19/22 0329 12/19/22 0333 12/19/22 0559 12/19/22 1204  BP: (!) 138/96  (!) 166/100 (P) 135/88  Pulse:   97 (P) 94  Resp: 18  17 (P) 19  Temp: 98.7 F (37.1 C)  98.2 F (36.8 C) (P) 97.6 F (36.4 C)  TempSrc: Axillary  Axillary (P) Axillary  SpO2: 98%  97% (P) 98%  Weight:  87.5 kg      Intake/Output Summary (Last 24 hours) at 12/19/2022 1337 Last data filed at 12/19/2022 1315 Gross per 24 hour  Intake 895.62 ml  Output 400 ml  Net 495.62 ml   Filed  Weights   12/19/22 0333  Weight: 87.5 kg     Exam General exam: ill appearing lethargic. Opening eyes to verbal cues.  Respiratory system: Air entry fair.  Cardiovascular system: S1 & S2 heard, RRR. Gastrointestinal system: Abdomen is soft, NT  Central nervous system: lethargic.  Extremities: no cyanosis.  Skin: No rashes,  Psychiatry: unable to assess.     Data Reviewed:  I have personally reviewed following labs and imaging studies   CBC Lab Results  Component Value Date   WBC 14.7 (H) 12/18/2022   RBC 3.59 (L) 12/18/2022   HGB 10.6 (L) 12/18/2022   HCT 31.2 (L) 12/18/2022   MCV 86.9 12/18/2022   MCH 29.5 12/18/2022   PLT 193 12/18/2022   MCHC 34.0 12/18/2022   RDW 13.9 12/18/2022   LYMPHSABS 2.6 12/07/2022   MONOABS 0.9 12/07/2022   EOSABS 0.7 (H) 12/07/2022   BASOSABS 0.0 12/07/2022     Last metabolic panel Lab Results  Component Value Date   NA 135 12/19/2022   K 2.8 (L) 12/19/2022   CL 98 12/19/2022   CO2 25 12/19/2022   BUN <5 (L) 12/19/2022   CREATININE 0.54 12/19/2022   GLUCOSE 105 (H) 12/19/2022   GFRNONAA >60 12/19/2022   GFRAA >60 01/15/2015   CALCIUM 7.5 (L) 12/19/2022   PHOS 3.3 12/07/2022   PROT 5.0 (L) 12/19/2022   ALBUMIN 1.8 (L) 12/19/2022   BILITOT 0.9 12/19/2022   ALKPHOS 77 12/19/2022   AST 45 (H) 12/19/2022   ALT 22 12/19/2022   ANIONGAP 12 12/19/2022    CBG (last 3)  Recent Labs    12/19/22 0225 12/19/22 0617 12/19/22 1203  GLUCAP 88 93 112*      Coagulation Profile: No results for input(s): "INR", "PROTIME" in the last 168 hours.   Radiology Studies: Overnight EEG with video  Result Date: 12/19/2022 [name], MD     12/19/2022 12:57 PM Patient Name: Abigail [name] [facility] MRN: [id number] Epilepsy Attending: [name] Referring Physician/Provider: Gordy Councilman, MD Duration: 12/19/2022 0027 to 0930  Patient history: 57 year old female past medical history of anxiety, fibromyalgia, chronic pain,  IBS, colitis. Presented with acute pancreatitis, colitis, acute metabolic encephalopathy and hypokalemia. EEG to evaluate for seizure  Level of alertness: awake,  asleep  AEDs during EEG study: LEV  Technical aspects: This EEG study was done with scalp electrodes positioned according to the 10-20 International system of electrode placement. Electrical activity was reviewed with band pass filter of 1-70Hz , sensitivity of 7 uV/mm, display speed of 64mm/sec with a 60Hz  notched filter applied as appropriate. EEG data were recorded continuously and digitally stored.  Video monitoring was available and reviewed as appropriate.  Description: No clear posterior dominant was being seen. Sleep was characterized by sleep symptoms (12 to 14 Hz), maximal frontocentral region. EEG showed continuous generalized polymorphic sharply contoured 3 to 6 Hz theta-delta slowing. Bilateral independent epileptiform discharges with overriding fast activity were noted in left and right occipital region at 0.5-1hz . Seizures without clinical signs were noted arising from left occipital region.  During seizure, EEG showed 10 to 11 Hz sharply contoured alpha activity admixed with spikes in left occipital region which then involved left centro- temporo- parietal region and evolved to 2 to 3 Hz delta slowing.  Only 1 seizure also involved the right occipital region on 12/19/2022 at 1103. Sixteen seizures were noted, lasting about 30 seconds to 1 minute each. Hyperventilation and photic stimulation were not performed.    ABNORMALITY -Seizure without clinical signs,  left occipital region -Bilateral independent periodic discharges with overriding fast activity, left and right occipital region  - Continuous slow, generalized  IMPRESSION: This study showed 16 seizures without clinical signs arising from left occipital region, lasting for 30 seconds to 1 minute each.  Additionally there is evidence of independent epileptogenicity with increased risk of  seizures in right occipital region.  Lastly there is moderate to severe diffuse encephalopathy.  [name]  EEG adult  Result Date: 12/18/2022 [name], MD     12/18/2022  5:27 PM Patient Name: Abigail [name] [facility] MRN: [id number] Epilepsy Attending: [name] Referring Physician/Provider: Alford Highland, MD Date: 12/18/2022 Duration: 26.12 mins Patient history: 57 year old female past medical history of anxiety, fibromyalgia, chronic pain, IBS, colitis. Presented with acute pancreatitis, colitis, acute metabolic encephalopathy and hypokalemia. EEG to evaluate for seizure Level of alertness:  comatose AEDs during EEG study: None Technical aspects: This EEG study was done with scalp electrodes positioned according to the 10-20 International system of electrode placement. Electrical activity was reviewed with band pass filter of 1-70Hz , sensitivity of 7 uV/mm, display speed of 68mm/sec with a 60Hz  notched filter applied as appropriate. EEG data were recorded continuously and digitally stored.  Video monitoring was available and reviewed as appropriate. Description: EEG showed continuous generalized polymorphic sharply contoured 3 to 6 Hz theta-delta slowing. Bilateral independent epileptiform discharges were noted in left and right occipital region with fluctuating frequency of 1-2hz  , at times rhythmic with evolution in morphology and frequency. Per EEG etch patient was confused and had left eye jerking. 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  Hyperventilation and photic stimulation were not performed.   ABNORMALITY - Focal motor status epilepticus,  left and right occipital region. 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 Clinically patient was confused and had left eye jerking. Additionally there was severe diffuse  encephalopathy. Dr. Otelia Limes and Dr Hilton Sinclair were notified. [name]   MR BRAIN WO CONTRAST  Result Date: 12/18/2022 CLINICAL DATA:  Altered mental status EXAM: MRI HEAD WITHOUT CONTRAST TECHNIQUE: Multiplanar, multiecho pulse sequences of the brain and surrounding structures were  obtained without intravenous contrast. COMPARISON:  CT Head 12/14/22 FINDINGS: Brain: There is hyperintense signal on diffusion-weighted imaging in the bilateral parieto-occipital region with corresponding signal abnormality on the ADC map. There is also T2/FLAIR hyperintense signal abnormalities region. No hemorrhage. No extra-axial fluid collection. No hydrocephalus. Vascular: Normal flow voids. Skull and upper cervical spine: Normal marrow signal. Sinuses/Orbits: No middle ear or mastoid effusion. Mucosal thickening left maxillary sinus. Orbits are unremarkable. Other: None. IMPRESSION: Relatively symmetric bilateral parieto-occipital signal abnormality with regions of true diffusion restriction. Findings could be seen in the setting of PRES or severe hypoglycemic metabolic encephalopathy. Electronically Signed   By: Lorenza Cambridge M.D.   On: 12/18/2022 12:42       Kathlen Mody M.D. [facility] 12/19/2022, 1:37 PM  Available via Epic secure chat 7am-7pm After 7 pm, please refer to night coverage provider listed on amion.

## 2022-12-19 NOTE — Assessment & Plan Note (Addendum)
Chronic I'm guessing. Pt has had multiple CT abd that show fluid around pancreas but minimal/normal lipase levels. Has been seen by [facility] GI. Was suppose to get MRCP at some point.

## 2022-12-19 NOTE — Progress Notes (Signed)
Patient sent off floor for MD ordered - lumbar puncture. Patient accompanied by patient's mother and transport staff. VSS. Respirations are even and unlabored. NAD. Patient denies CP or SOB. RA.

## 2022-12-19 NOTE — Consult Note (Addendum)
Neurology Consultation Reason for Consult: Focal status on EEG  Requesting Physician:  Carollee Herter  CC: altered mental status   History is obtained from: Chart review, no family available at the time of my eval  HPI: Abigail [name] [facility] is a 57 y.o. female with a past medical history significant for rheumatoid arthritis on methotrexate in the past, chronic pain with chronic opiate use, hypertension, possible giant cell aortic arteritis, degenerative disc disease with prior C5-7 fusion and L4-S1 fusion  She was admitted at [facility] from 08/21/2022 through 08/26/2022 presenting initially with back pain, felt to have pancreatitis with lipase elevation 120 and right upper quadrant tenderness to palpation as well as significant diarrhea.  She was having headaches and blurry vision for which she was started on GCA treatment with 60 mg of prednisone daily and plans to follow-up with her primary rheumatologist at [facility] clinic noting that she was not currently on immunosuppressive medication for GCA or RA at the time.  Apparently she was also treated in December 2023 with IV Solu-Medrol x 3 days followed by prednisone p.o. 60 mg daily  She was seen by ophthalmology 08/28/2022, noted to have had giant cell arteritis nonocular involving diagnosed during her recent hospitalization with negative temporal artery biopsy that based on clinical signs and being treated with oral steroids.  She was reporting blurry vision in the left eye at that time visual acuity was 20/25 bilaterally with normal exam and  healthy optic nerves, no thinning or edema on OCT (mild GC loss OS comparable to OD).  It appears she had an MRA but not an MRI for workup of her vision changes and concern for GCA  She has had progressive decline with encephalopathy and worsening vision, complaints of full body pain, intermittent hallucinations, noted to have severe hypokalemia and has had a psychiatric evaluation.  She has been started on high-dose  thiamine, and has had an admission for severe hypoglycemia to the 30s recently  09/26/2022 Rheumatology notes regarding serological workup ESR 57 --> 63 --> 45 CRP 60 --> 102 --> 26 Pos: AntiCCP 231 (H) Neg: ANA IFA, 14.3.3 ETA, RF, SSA, SSB Neg: HIV, Hep B and C; Quantiferon --2013-- Neg: RF and ANA --2019-- Neg: Hep C and Quantiferon   ROS: Unable to obtain due to altered mental status.   Past Medical History:  Diagnosis Date   Anxiety    Arthritis    Back pain    Basal cell carcinoma 10/04/2021   R axilla - needs excised 11/28/21   Basal cell carcinoma 10/04/2021   L antecubital excised 11/14/21   Diarrhea 11/12/2016   Fibromyalgia    Generalized abdominal pain 11/12/2016   H. pylori infection    Hyperlipidemia    IBS (irritable bowel syndrome)    Infectious colitis 04/29/2016   Migraines    Moderate dehydration 04/29/2016   Muscle pain    Reflux    Unexplained weight loss 11/12/2016   Past Surgical History:  Procedure Laterality Date   ABDOMINAL HYSTERECTOMY     APPENDECTOMY  2009   C5 FUSION     C6 FUSION     C7 FUSION     COLONOSCOPY  02/2006   COLONOSCOPY WITH PROPOFOL N/A 12/25/2016   Procedure: COLONOSCOPY WITH PROPOFOL;  Surgeon: Earline Mayotte, MD;  Location: [facility] ENDOSCOPY;  Service: Endoscopy;  Laterality: N/A;   ESOPHAGOGASTRODUODENOSCOPY (EGD) WITH PROPOFOL N/A 12/25/2016   Procedure: ESOPHAGOGASTRODUODENOSCOPY (EGD) WITH PROPOFOL;  Surgeon: Earline Mayotte, MD;  Location[facility]  ENDOSCOPY;  Service: Endoscopy;  Laterality: N/A;   FOOT SURGERY     HIP ARTHROPLASTY     L4 FUSION     L5 FUSION     S1 FUSION       No family history on file.   Social History:  reports that she has been smoking cigarettes. She has been smoking an average of 1 pack per day. She has never used smokeless tobacco. She reports current alcohol use. She reports that she does not use drugs.   Exam: Current vital signs: BP 135/74 (BP Location: Right Arm)   Pulse 95    Temp 98.9 F (37.2 C) (Axillary)   Resp 20   SpO2 96%  Vital signs in last 24 hours: Temp:  [97.8 F (36.6 C)-98.9 F (37.2 C)] 98.9 F (37.2 C) (04/30 2336) Pulse Rate:  [81-95] 95 (04/30 2336) Resp:  [17-20] 20 (04/30 2336) BP: (125-141)/(55-88) 135/74 (04/30 2336) SpO2:  [94 %-99 %] 96 % (04/30 2336)   Physical Exam  Constitutional: Appears acutely and chronically ill Psych: Minimally interactive Eyes: No scleral injection HENT: No oropharyngeal obstruction.  MSK: Significant scoliosis of the lumbar spine, difficult to palpate landmarks Cardiovascular: Perfusing extremities well Respiratory: Effort normal, non-labored breathing GI: Soft.  No distension. There is no tenderness. Dark brown liquid stool with turning  Skin: Warm dry and intact visible skin  Neuro: Mental Status: Patient is awake, alert, able to state her name, follow simple commands (squeezes and lets go),  Cranial Nerves: II: Visual Fields are no clear blink to threat. Pupils are equal, round, and reactive to light, sluggish III,IV, VI: EOMI without ptosis or diploplia. Right gaze preference. Downgaze at times V: Facial sensation is symmetric to light eyelash brush VII: Facial movement is symmetric.  VIII: hearing is intact to voice X: Uvula difficult to visualize XI: Shoulder shrug does not participate XII: tongue is midline without atrophy or fasciculations.  Motor: Tone is normal. Bulk is normal. Antigravity bilateral upper extremities. Lower extremities 2/5 on the right, 1/5 on the left  Sensory: Grossly equally responsive in all 4  Deep Tendon Reflexes: 2+ and symmetric in the brachioradialis and patellae.  Cerebellar: Unable to assess secondary to patient's mental status  Gait:  Deferred  I have reviewed labs in epic and the results pertinent to this consultation are:  Basic Metabolic Panel: Recent Labs  Lab 12/14/22 1836 12/14/22 2236 12/15/22 0424 12/16/22 0858 12/17/22 0557  12/18/22 0632  NA  --  136 137 135 135 135  K  --  2.7* 2.9* 3.1* 2.8* 2.4*  CL  --  97* 98 96* 92* 95*  CO2  --  27 27 29  33* 28  GLUCOSE  --  76 75 88 105* 100*  BUN  --  <5* <5* <5* <5* <5*  CREATININE  --  0.57 0.45 0.52 0.49 0.52  CALCIUM  --  7.4* 7.7* 7.9* 7.7* 7.4*  MG 1.8  --   --  1.8  --  1.8    CBC: Recent Labs  Lab 12/14/22 1400 12/15/22 0424 12/16/22 0858 12/17/22 0557 12/18/22 0632  WBC 6.3 7.5 8.5 10.7* 14.7*  HGB 10.8* 10.4* 10.9* 11.8* 10.6*  HCT 33.5* 31.5* 33.4* 35.3* 31.2*  MCV 91.8 90.5 90.8 88.3 86.9  PLT 224 200 210 223 193    Coagulation Studies: No results for input(s): "LABPROT", "INR" in the last 72 hours.   Lab Results  Component Value Date   VITAMINB12 1,239 (H) 12/17/2022  I have reviewed the images obtained:  MRI brain personally reviewed, agree with radiology read, but in addition I am concerned about the possibility of FLAIR nonsuppression in the sulci potentially concerning for meningitis in this immunosuppressed patient Relatively symmetric bilateral parieto-occipital signal abnormality with regions of true diffusion restriction. Findings could be seen in the setting of PRES or severe hypoglycemic metabolic encephalopathy.   Impression: Focal status epilepticus in an immunosuppressed patient with possible PRES versus potential for meningitis or inflammatory autoimmune CNS disease. Due to her significant scoliosis and prior lumbar spine surgery she is a poor candidate for bedside LP and will need IR LP.  Given her history of recent pancreatitis, I do not think that Depakote would be a good antiseizure medication for her, will consider Vimpat or standing clonazepam next if needed  Recommendations: - Overnight EEG - s/p 2 g keppra at [facility] at 6 PM on 4/30 - Keppra 2500 mg for full load of 4.5 mg total, then 1500 mg BID.  - EKG to confirm safety of Vimpat as next agent if additional agents are needed -Thiamine 500 mg IV every 8  hours with Wernicke's dosing panel - Additional vitamin supplementation per primary team - HIV, RPR - ESR, CRP - CMP, ammonia, lipase - Will need IR lumbar puncture with opening pressure, cell counts, protein, glucose, meningitis/encephalitis panel, fungal culture, oligoclonal bands, IgG index, and potentially additional studies pending preliminary studies.   - Neurology will continue to follow  Brooke Dare MD-PhD [facility] [phone] Available 7 AM to 7 PM, outside these hours please contact Neurologist on call listed on AMION

## 2022-12-19 NOTE — Progress Notes (Signed)
vLTM maintenance  All impedances below 10kohms.  No skin breakdown noted at all skin sites 

## 2022-12-19 NOTE — Progress Notes (Signed)
Bedtime cbg- 94.

## 2022-12-19 NOTE — Assessment & Plan Note (Signed)
Stable

## 2022-12-19 NOTE — Subjective & Objective (Addendum)
CC: status epilepticus HPI: 57 year old Caucasian female history of rheumatoid arthritis, giant cell arteritis on chronic prednisone, chronic pain syndrome on chronic opiates through [facility] in [facility], chronic pancreatitis, hypothyroidism, depression who presents as a transfer from [facility] where she was admitted.  She was found to be in focal status epilepticus during routine EEG testing today.  She was transferred to [facility] for long-term EEG.  Patient unable to give history or review of systems.  No family is available.  History obtained for the patient's chart.  She was admitted on 12/14/2022 at [facility].  Her chief complaint was failure to thrive.  Reportedly by EMS she had not been eating or drinking.  She was encephalopathic on admission.  Her initial abdomen pelvis CAT scan showed peripancreatic fat stranding.  There were several cysts in the pancreas itself.  This is similar in description from her CT abdomen pelvis she had on August 26, 2022 where she had diffuse edema of the pancreas with pancreatic soft tissue stranding.  Her lipase in January 2024 was 120.  When she was admitted at [facility] on 26 April, it was 65.  CT scan also showed some wall thickening of the colon along the right side with some stranding.  Patient's white count was not elevated.  It was 6.3.  Over the course the next 3 days, the patient continued to be encephalopathic.  Initially thought this was psychiatric and she was seen by psychiatry.  Due to continued encephalopathy, neurology was consulted.  Routine EEG testing showed that she had focal motor status epilepticus arising from the left and right occipital regions.  Patient was transferred to [facility] after an MRI was obtained that showed hyperintense signals on diffusion-weighted imaging in the bilateral parietal occipital region.  Patient had not been excessively hypertensive during the  hospitalization and patient was euglycemic or slightly hyperglycemic (highest blood sugar was 155).  Radiologist suggested that the patient symmetric bilateral parietal occipital signaling could be due to PRES or severe hypoglycemic encephalopathy.  There is no excessive hypertension during her hospitalization at [facility].

## 2022-12-20 DIAGNOSIS — G40901 Epilepsy, unspecified, not intractable, with status epilepticus: Secondary | ICD-10-CM | POA: Diagnosis not present

## 2022-12-20 DIAGNOSIS — K861 Other chronic pancreatitis: Secondary | ICD-10-CM | POA: Diagnosis not present

## 2022-12-20 DIAGNOSIS — M069 Rheumatoid arthritis, unspecified: Secondary | ICD-10-CM | POA: Diagnosis not present

## 2022-12-20 DIAGNOSIS — M316 Other giant cell arteritis: Secondary | ICD-10-CM | POA: Diagnosis not present

## 2022-12-20 LAB — BASIC METABOLIC PANEL
Anion gap: 14 (ref 5–15)
Anion gap: 10 (ref 5–15)
BUN: <5 mg/dL — ABNORMAL LOW (ref 6–20)
BUN: <5 mg/dL — ABNORMAL LOW (ref 6–20)
CO2: 27 mmol/L (ref 22–32)
CO2: 28 mmol/L (ref 22–32)
Calcium: 7.9 mg/dL — ABNORMAL LOW (ref 8.9–10.3)
Calcium: 7.6 mg/dL — ABNORMAL LOW (ref 8.9–10.3)
Chloride: 95 mmol/L — ABNORMAL LOW (ref 98–111)
Chloride: 97 mmol/L — ABNORMAL LOW (ref 98–111)
Creatinine, Ser: 0.47 mg/dL (ref 0.44–1.00)
Creatinine, Ser: 0.46 mg/dL (ref 0.44–1.00)
GFR, Estimated: >60 mL/min (ref >60)
GFR, Estimated: >60 mL/min (ref >60)
Glucose, Bld: 91 mg/dL (ref 70–99)
Glucose, Bld: 75 mg/dL (ref 70–99)
Potassium: 2.8 mmol/L — ABNORMAL LOW (ref 3.5–5.1)
Potassium: 3.3 mmol/L — ABNORMAL LOW (ref 3.5–5.1)
Sodium: 136 mmol/L (ref 135–145)
Sodium: 135 mmol/L (ref 135–145)

## 2022-12-20 LAB — MAGNESIUM: Magnesium: 2.2 mg/dL (ref 1.7–2.4)

## 2022-12-20 LAB — CBC WITH DIFFERENTIAL/PLATELET
Abs Immature Granulocytes: 0.08 10*3/uL — ABNORMAL HIGH (ref 0.00–0.07)
Basophils Absolute: 0 10*3/uL (ref 0.0–0.1)
Basophils Relative: 0 %
Eosinophils Absolute: 0 10*3/uL (ref 0.0–0.5)
Eosinophils Relative: 0 %
HCT: 36.9 % (ref 36.0–46.0)
Hemoglobin: 12.6 g/dL (ref 12.0–15.0)
Immature Granulocytes: 1 %
Lymphocytes Relative: 25 %
Lymphs Abs: 2 10*3/uL (ref 0.7–4.0)
MCH: 29.9 pg (ref 26.0–34.0)
MCHC: 34.1 g/dL (ref 30.0–36.0)
MCV: 87.4 fL (ref 80.0–100.0)
Monocytes Absolute: 0.6 10*3/uL (ref 0.1–1.0)
Monocytes Relative: 8 %
Neutro Abs: 5.1 10*3/uL (ref 1.7–7.7)
Neutrophils Relative %: 66 %
Platelets: 150 10*3/uL (ref 150–400)
RBC: 4.22 MIL/uL (ref 3.87–5.11)
RDW: 14.1 % (ref 11.5–15.5)
WBC: 7.8 10*3/uL (ref 4.0–10.5)
nRBC: 0 % (ref 0.0–0.2)

## 2022-12-20 LAB — C DIFFICILE QUICK SCREEN W PCR REFLEX
C Diff antigen: NEGATIVE
C Diff interpretation: NOT DETECTED
C Diff toxin: NEGATIVE

## 2022-12-20 LAB — GLUCOSE, CAPILLARY
Glucose-Capillary: 94 mg/dL (ref 70–99)
Glucose-Capillary: 89 mg/dL (ref 70–99)
Glucose-Capillary: 81 mg/dL (ref 70–99)
Glucose-Capillary: 149 mg/dL — ABNORMAL HIGH (ref 70–99)
Glucose-Capillary: 166 mg/dL — ABNORMAL HIGH (ref 70–99)

## 2022-12-20 LAB — PHENYTOIN LEVEL, TOTAL: Phenytoin Lvl: 4.2 ug/mL — ABNORMAL LOW (ref 10.0–20.0)

## 2022-12-20 MED ORDER — POTASSIUM CHLORIDE 10 MEQ/100ML IV SOLN
10.0000 meq | INTRAVENOUS | Status: AC
  Administered 2022-12-20 (×2): 10 meq via INTRAVENOUS
  Filled 2022-12-20: qty 100

## 2022-12-20 MED ORDER — ADULT MULTIVITAMIN W/MINERALS CH
1.0000 | ORAL_TABLET | Freq: Every day | ORAL | Status: DC
  Administered 2022-12-20 – 2022-12-26 (×7): 1 via ORAL
  Filled 2022-12-20 (×7): qty 1

## 2022-12-20 MED ORDER — SODIUM CHLORIDE 0.9 % IV SOLN
100.0000 mg | Freq: Two times a day (BID) | INTRAVENOUS | Status: DC
  Administered 2022-12-20 – 2022-12-22 (×5): 100 mg via INTRAVENOUS
  Filled 2022-12-20 (×7): qty 10

## 2022-12-20 MED ORDER — POTASSIUM CHLORIDE 10 MEQ/100ML IV SOLN
10.0000 meq | INTRAVENOUS | Status: AC
  Administered 2022-12-20 (×4): 10 meq via INTRAVENOUS
  Filled 2022-12-20 (×5): qty 100

## 2022-12-20 MED ORDER — ENSURE ENLIVE PO LIQD
237.0000 mL | Freq: Two times a day (BID) | ORAL | Status: DC
  Administered 2022-12-20 – 2022-12-25 (×10): 237 mL via ORAL

## 2022-12-20 MED ORDER — SODIUM CHLORIDE 0.9 % IV SOLN
200.0000 mg | INTRAVENOUS | Status: AC
  Administered 2022-12-20: 200 mg via INTRAVENOUS
  Filled 2022-12-20: qty 20

## 2022-12-20 MED ORDER — SODIUM CHLORIDE 0.9 % IV SOLN
2000.0000 mg | Freq: Two times a day (BID) | INTRAVENOUS | Status: DC
  Administered 2022-12-20 – 2022-12-24 (×8): 2000 mg via INTRAVENOUS
  Filled 2022-12-20 (×11): qty 20

## 2022-12-20 MED ORDER — POTASSIUM CHLORIDE CRYS ER 20 MEQ PO TBCR: 40.0000 meq | EXTENDED_RELEASE_TABLET | Freq: Two times a day (BID) | ORAL | Status: DC

## 2022-12-20 MED ORDER — SODIUM CHLORIDE 0.9 % IV SOLN
1000.0000 mg | Freq: Every day | INTRAVENOUS | Status: AC
  Administered 2022-12-20 – 2022-12-22 (×3): 1000 mg via INTRAVENOUS
  Filled 2022-12-20 (×3): qty 16

## 2022-12-20 NOTE — Progress Notes (Addendum)
Subjective: Continues to have subclinical seizures overnight. Reports headache and generalized pain. Also asking about food  ROS: negative except above Examination  Vital signs in last 24 hours: Temp:  [97.5 F (36.4 C)-98.3 F (36.8 C)] 98.3 F (36.8 C) (05/02 0738) Pulse Rate:  [84-97] 84 (05/02 0738) Resp:  [13-19] 16 (05/02 0738) BP: (109-160)/(49-111) 159/81 (05/02 0738) SpO2:  [96 %-99 %] 98 % (05/02 0738) Weight:  [86.7 kg] 86.7 kg (05/02 0500)  General: lying in bed, NAD Neuro: AOx3, able to do simple math, follows simple commands,  PERRLA, left hemianopia, antigravity strength in all 4 extremities    Basic Metabolic Panel: Recent Labs  Lab 12/14/22 1836 12/14/22 2236 12/16/22 0858 12/17/22 0557 12/18/22 0632 12/19/22 0300 12/20/22 0000  NA  --    < > 135 135 135 135 136  K  --    < > 3.1* 2.8* 2.4* 2.8* 2.8*  CL  --    < > 96* 92* 95* 98 95*  CO2  --    < > 29 33* 28 25 27   GLUCOSE  --    < > 88 105* 100* 105* 91  BUN  --    < > <5* <5* <5* <5* <5*  CREATININE  --    < > 0.52 0.49 0.52 0.54 0.47  CALCIUM  --    < > 7.9* 7.7* 7.4* 7.5* 7.9*  MG 1.8  --  1.8  --  1.8 2.0  --    < > = values in this interval not displayed.    CBC: Recent Labs  Lab 12/15/22 0424 12/16/22 0858 12/17/22 0557 12/18/22 0632 12/20/22 0000  WBC 7.5 8.5 10.7* 14.7* 7.8  NEUTROABS  --   --   --   --  5.1  HGB 10.4* 10.9* 11.8* 10.6* 12.6  HCT 31.5* 33.4* 35.3* 31.2* 36.9  MCV 90.5 90.8 88.3 86.9 87.4  PLT 200 210 223 193 150     Coagulation Studies: No results for input(s): "LABPROT", "INR" in the last 72 hours.  Imaging No new brain imaging   ASSESSMENT AND PLAN: 57 year old female presented to [facility] hospital on 12/14/2022 with altered mental status.  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  Therefore she was transferred to [facility] for long-term EEG.   Focal  convulsive status epilepticus Focal seizures -Unclear etiology of focal status epilepticus.  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   Recommendations - Will order IV Vimpat 200mg  once and 100mg  BID, EKG ordered and reviewed - Continue phenytoin 50mg  Q8h, level ordered - Increased keppra to 2000mg  BID - Can add clonazepam 0.5mg  BID if seizures continue - Will also start IV solumedrol 1000mg  for 3 days 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 Blake Divine via secure chat and family at bedside     CRITICAL CARE Performed by: [name]     Total critical care time: 35 minutes   Critical care time was exclusive of separately  billable procedures and treating other patients.   Critical care was necessary to treat or prevent imminent or life-threatening deterioration.   Critical care was time spent personally by me on the following activities: development of treatment plan with patient and/or surrogate as well as nursing, discussions with consultants, evaluation of patient's response to treatment, examination of patient, obtaining history from patient or surrogate, ordering and performing treatments and interventions, ordering and review of laboratory studies, ordering and review of radiographic studies, pulse oximetry and re-evaluation of patient's condition.    Lindie Spruce Epilepsy [facility] For questions after 5pm please refer to AMION to reach  the Neurologist on call

## 2022-12-20 NOTE — Progress Notes (Signed)
Initial Nutrition Assessment  DOCUMENTATION CODES:   Severe malnutrition in context of acute illness/injury  INTERVENTION:  Multivitamin w/ minerals daily Ensure Enlive po BID, each supplement provides 350 kcal and 20 grams of protein. Once Cortrak is placed tomorrow (5/3) and confirmed by x-ray, Start Osmolite 1.5 at 20 mL/hr and advance by 10 mL every 12 hours to goal rate of 60 mL/hr (1440 mL per day) 60 mL ProSource TF20 - daily Free water flush: 165 mL q4h Tube feeds at goal provides 2240 kcal, 110 grams protein, and 2087 mL total free water.  Monitor magnesium, potassium, and phosphorus BID for at least 3 days, MD to replete as needed, as pt is at risk for refeeding syndrome given prolonged NPO and malnutrition.  NUTRITION DIAGNOSIS:   Severe Malnutrition related to acute illness as evidenced by severe muscle depletion, percent weight loss.  GOAL:   Patient will meet greater than or equal to 90% of their needs  MONITOR:   PO intake, Supplement acceptance, Labs, I & O's  REASON FOR ASSESSMENT:   Other (Comment) (Cortrak List)    ASSESSMENT:   57 y.o. female transferred from [facility] for long-term EEG, after presenting for FTT and encephalopathic. PMH Includes chronic pancreatitis, GERD, DDD, depression, giant cell arteritis, HTN, and hypothyroidism.   4/26 - admitted to [facility] 4/30 - transferred to [facility] 5/02 - SLP evaluation, diet advanced to regular  Pt reports that she thinks the last time that she ate was Saturday. Reports that she often eats one meal per day, typically dinner. Often is eating out for that meals. Reports a UBW of 205# and reports a 10-15# weight loss, she estimated the weight loss occurring over the past month. Per EMR, pt with a 7.3% weight loss within <1 month, this is clinically significant for time frame.   SLP was able to advance pt diet, recommend closely monitoring PO intake. Reached out to MD, plan for Cortrak tomorrow.   Medications reviewed and  include: NovoLog SSI, Thiamine, IV antibiotics, IV solu-medrol  Labs reviewed: Sodium 136, Potassium 2.8, BUN <5, Magnesium 2.2, Folate 8.8, Vitamin B 12 1239, Vitamin D 50.23, CRP 3.8  NUTRITION - FOCUSED PHYSICAL EXAM:  Flowsheet Row Most Recent Value  Orbital Region No depletion  Upper Arm Region Mild depletion  Thoracic and Lumbar Region No depletion  Buccal Region No depletion  Temple Region Unable to assess  [EEG Leads]  Clavicle Bone Region Moderate depletion  Clavicle and Acromion Bone Region Moderate depletion  Scapular Bone Region Severe depletion  Dorsal Hand Severe depletion  Patellar Region Severe depletion  Anterior Thigh Region Severe depletion  Posterior Calf Region Severe depletion  Edema (RD Assessment) None  Hair Reviewed  Eyes Reviewed  Mouth Reviewed  Skin Reviewed  Nails Reviewed   Diet Order:   Diet Order             Diet regular Room service appropriate? Yes; Fluid consistency: Thin  Diet effective now                   EDUCATION NEEDS:   Not appropriate for education at this time  Skin:  Skin Assessment: Reviewed RN Assessment  Last BM:  5/2 - Type 6  Height:  Ht Readings from Last 1 Encounters:  12/18/22 5\' 9"  (1.753 m)   Weight:  Wt Readings from Last 1 Encounters:  12/20/22 86.7 kg   Ideal Body Weight:  65.9 kg  BMI:  Body mass index is 28.23 kg/m.  Estimated Nutritional Needs:  Kcal:  2150-2350 Protein:  105-125 grams Fluid:  >/= 2 L   Kirby Crigler RD, LDN Clinical Dietitian See [facility] for contact information.

## 2022-12-20 NOTE — Procedures (Addendum)
Patient Name: Abigail [name] [facility]  MRN: [id number]  Epilepsy Attending: [name]  Referring Physician/Provider: Gordy Councilman, MD  Duration: 12/20/2022 0027 to 12/21/2022 0027   Patient history: 57 year old female past medical history of anxiety, fibromyalgia, chronic pain, IBS, colitis. Presented with acute pancreatitis, colitis, acute metabolic encephalopathy and hypokalemia. EEG to evaluate for seizure   Level of alertness: awake, asleep   AEDs during EEG study: LEV, PHT   Technical aspects: This EEG study was done with scalp electrodes positioned according to the 10-20 International system of electrode placement. Electrical activity was reviewed with band pass filter of 1-70Hz , sensitivity of 7 uV/mm, display speed of 64mm/sec with a 60Hz  notched filter applied as appropriate. EEG data were recorded continuously and digitally stored.  Video monitoring was available and reviewed as appropriate.   Description: No clear posterior dominant was being seen. Sleep was characterized by sleep symptoms (12 to 14 Hz), maximal frontocentral region. EEG showed continuous generalized polymorphic sharply contoured 3 to 6 Hz theta-delta slowing. Bilateral independent epileptiform discharges with overriding fast activity were noted in left and right occipital region at 0.5-1hz .    Seizures without clinical signs were noted arising from left occipital region.  During seizure, EEG showed 10 to 11 Hz sharply contoured alpha activity admixed with spikes in left occipital region which then involved left centro- temporo- parietal region and evolved to 2 to 3 Hz delta slowing. 52 seizures were noted, lasting about 30 seconds to 1 minute each.   Seizures without clinical signs were noted arising from right occipital region.  During seizure, EEG showed 10 to 11 Hz sharply contoured alpha activity admixed with spikes in right occipital region which then involved left centro- temporo- parietal region and  evolved to 2 to 3 Hz delta slowing and involved left hemisphere. 18 seizures were noted, lasting about 30 seconds to 1 minute each.   Hyperventilation and photic stimulation were not performed.      ABNORMALITY -Seizure without clinical signs,  left occipital region -Bilateral independent periodic discharges with overriding fast activity, left and right occipital region   - Continuous slow, generalized   IMPRESSION: This study showed 52 seizures without clinical signs arising from left occipital region and 18 seizures without clinical signs arising from right occipital region, lasting for 30 seconds to 1 minute each. Last seizure was on 12/20/2022 at 2203.  Additionally there is evidence of independent epileptogenicity with increased risk of seizures in right occipital region.  Lastly there is moderate to severe diffuse encephalopathy.  Sayre Witherington [name]

## 2022-12-20 NOTE — Progress Notes (Signed)
[facility]                                                                               [employer], is a 57 y.o. female, DOB - October 11, 1965, ZOX:[id number] Admit date - 12/18/2022    Outpatient Primary MD for the patient is [name], [name], MD  LOS - 2  days    Brief summary   57 year old Caucasian female history of rheumatoid arthritis, giant cell arteritis on chronic prednisone, chronic pain syndrome on chronic opiates through [facility] in [facility], chronic pancreatitis, hypothyroidism, depression who presents as a transfer from [facility] where she was admitted. She was found to be in focal status epilepticus during routine EEG testing today. She was transferred to [facility] for long-term EEG.    Assessment & Plan    Assessment and Plan: * Status epilepticus (HCC) Currently on LTM eeg.  Neurology on board .  Loading with  fosphenytoin, and keppra  followed by Keppra , increased to 2000 mg BID,  dilantin 50 mg every 8 hours.  Underwent FL guided LP by IR. Labs sent as per Dr Melynda Ripple.  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   Acute encephalopathy due to status epilepticus: Improving, she is more alert and oriented and following commands. SLP eval recommending regular diet with thin liquids.  Recommend therapy evaluation once LTM EEG is done.    Acute mild colitis:  She was started on IV rocephin and IV flagyl, will complete 7 days of antibiotics.  C DIFF antigen and toxin is negative.     Chronic pancreatitis (HCC) Recommend outpatient follow up with GI. Will probably need MRCP at some point.   Hypokalemia Replaced. Recheck later this afternoon.   Hypothyroidism Patient is not on any thyroid supplements. TSH wnl.   Essential hypertension BP parameters are optimal.   RA (rheumatoid arthritis) (HCC) Chronic., on methotrexate at home, has not had any meds in the last one week.    Chronic prescription opiate use - thru [facility] in [place], [place] Was getting oxycodone 20 mg 5 times a day.   Currently on IV morphine 2 mg PRN every 2 hours.   GCA (giant cell arteritis) (HCC) On chronic prednisone  At home. Once the IV solumedrol is completed , will restart her home dose of steroids.     Estimated body mass index is 28.23 kg/m as calculated from the following:   Height as of an earlier encounter on 12/18/22: 5\' 9"  (1.753 m).   Weight as of this encounter: 86.7 kg.  Code Status: full code.  DVT Prophylaxis:  SCDs Start: 12/19/22 0203   Level of Care: Level of care: Progressive Family Communication: none at bedside.   Disposition Plan:     Remains inpatient appropriate:  IV antibiotics, IV steroids.   Procedures:  FL guided LP on 5/1  Consultants:   Neurology.   Antimicrobials:   Anti-infectives (From admission, onward)    Start     Dose/Rate Route Frequency Ordered Stop   12/19/22 1800  cefTRIAXone (ROCEPHIN) 2 g in sodium chloride 0.9 % 100 mL IVPB  2 g 200 mL/hr over 30 Minutes Intravenous Every 24 hours 12/19/22 0450     12/19/22 1000  metroNIDAZOLE (FLAGYL) IVPB 500 mg        500 mg 100 mL/hr over 60 Minutes Intravenous 2 times daily 12/19/22 0450          Medications  Scheduled Meds:  insulin aspart  0-6 Units Subcutaneous TID WC   phenytoin (DILANTIN) IV  50 mg Intravenous Q8H   [START ON 12/27/2022] thiamine (VITAMIN B1) injection  100 mg Intravenous Daily   Continuous Infusions:  cefTRIAXone (ROCEPHIN)  IV Stopped (12/19/22 1739)   levETIRAcetam     methylPREDNISolone (SOLU-MEDROL) injection     metronidazole Stopped (12/20/22 0923)   thiamine (VITAMIN B1) injection 100 mL/hr at 12/20/22 8119   Followed by   Melene Muller ON 12/21/2022] thiamine (VITAMIN B1) injection     PRN Meds:.acetaminophen, midazolam, morphine injection    Subjective:   Abigail [name] was seen and examined today.  More alert today and talking. No new  complaints.   Objective:   Vitals:   12/19/22 2334 12/20/22 0317 12/20/22 0500 12/20/22 0738  BP: (!) 115/49 (!) 109/59  (!) 159/81  Pulse: 92 97  84  Resp: 16 13  16   Temp: 98 F (36.7 C) (!) 97.5 F (36.4 C)  98.3 F (36.8 C)  TempSrc: Axillary Oral    SpO2: 97% 97%  98%  Weight:   86.7 kg     Intake/Output Summary (Last 24 hours) at 12/20/2022 1302 Last data filed at 12/20/2022 1478 Gross per 24 hour  Intake 1460.99 ml  Output 700 ml  Net 760.99 ml    Filed Weights   12/19/22 0333 12/20/22 0500  Weight: 87.5 kg 86.7 kg     Exam General exam: Appears calm and comfortable  Respiratory system: Clear to auscultation. Respiratory effort normal. Cardiovascular system: S1 & S2 heard, RRR. No JVD, Gastrointestinal system: Abdomen is nondistended, soft and nontender.  Central nervous system: Alert and oriented. Grossly non focal.  Extremities: Symmetric 5 x 5 power. Skin: No rashes,  Psychiatry:. Mood & affect appropriate.      Data Reviewed:  I have personally reviewed following labs and imaging studies   CBC Lab Results  Component Value Date   WBC 7.8 12/20/2022   RBC 4.22 12/20/2022   HGB 12.6 12/20/2022   HCT 36.9 12/20/2022   MCV 87.4 12/20/2022   MCH 29.9 12/20/2022   PLT 150 12/20/2022   MCHC 34.1 12/20/2022   RDW 14.1 12/20/2022   LYMPHSABS 2.0 12/20/2022   MONOABS 0.6 12/20/2022   EOSABS 0.0 12/20/2022   BASOSABS 0.0 12/20/2022     Last metabolic panel Lab Results  Component Value Date   NA 136 12/20/2022   K 2.8 (L) 12/20/2022   CL 95 (L) 12/20/2022   CO2 27 12/20/2022   BUN <5 (L) 12/20/2022   CREATININE 0.47 12/20/2022   GLUCOSE 91 12/20/2022   GFRNONAA >60 12/20/2022   GFRAA >60 01/15/2015   CALCIUM 7.9 (L) 12/20/2022   PHOS 3.3 12/07/2022   PROT 5.0 (L) 12/19/2022   ALBUMIN 1.8 (L) 12/19/2022   BILITOT 0.9 12/19/2022   ALKPHOS 77 12/19/2022   AST 45 (H) 12/19/2022   ALT 22 12/19/2022   ANIONGAP 14 12/20/2022    CBG (last 3)   Recent Labs    12/19/22 2135 12/20/22 0621 12/20/22 1145  GLUCAP 94 89 81       Coagulation Profile: No results for input(s): "INR", "  PROTIME" in the last 168 hours.   Radiology Studies: DG FL GUIDED LUMBAR PUNCTURE  Result Date: 12/19/2022 CLINICAL DATA:  Altered mental status and seizure activity. Request for diagnostic lumbar puncture. EXAM: DIAGNOSTIC LUMBAR PUNCTURE UNDER FLUOROSCOPIC GUIDANCE COMPARISON:  None Available. FLUOROSCOPY: Radiation Exposure Index (as provided by the fluoroscopic device): 1.2 mGy Kerma PROCEDURE: Informed consent was obtained from the patient's mother prior to the procedure, including potential complications of headache, allergy, and pain. With the patient prone, the lower back was prepped with Betadine. 1% Lidocaine was used for local anesthesia. Lumbar puncture was performed at the L2-L3 level using a 20 gauge needle with return of clear, colorless CSF with an opening pressure of 16 cm water. Approximately 10 ml of CSF were obtained for laboratory studies. The patient tolerated the procedure well and there were no apparent complications. IMPRESSION: Technically successful image guided lumbar puncture from the L2-L3 level as above. Procedure performed by Brayton El PA-C supervised by Dr. Gennette Pac Electronically Signed   By: Marin Roberts M.D.   On: 12/19/2022 14:47   Overnight EEG with video  Result Date: 12/19/2022 [name], MD     12/20/2022 10:17 AM Patient Name: Abigail [name] [facility] MRN: [id number] Epilepsy Attending: [name] Referring Physician/Provider: Gordy Councilman, MD Duration: 12/19/2022 0027 to 12/20/2022 0027  Patient history: 57 year old female past medical history of anxiety, fibromyalgia, chronic pain, IBS, colitis. Presented with acute pancreatitis, colitis, acute metabolic encephalopathy and hypokalemia. EEG to evaluate for seizure  Level of alertness: awake, asleep  AEDs during EEG study: LEV, PHT  Technical  aspects: This EEG study was done with scalp electrodes positioned according to the 10-20 International system of electrode placement. Electrical activity was reviewed with band pass filter of 1-70Hz , sensitivity of 7 uV/mm, display speed of 23mm/sec with a 60Hz  notched filter applied as appropriate. EEG data were recorded continuously and digitally stored.  Video monitoring was available and reviewed as appropriate.  Description: No clear posterior dominant was being seen. Sleep was characterized by sleep symptoms (12 to 14 Hz), maximal frontocentral region. EEG showed continuous generalized polymorphic sharply contoured 3 to 6 Hz theta-delta slowing. Bilateral independent epileptiform discharges with overriding fast activity were noted in left and right occipital region at 0.5-1hz . Seizures without clinical signs were noted arising from left occipital region.  During seizure, EEG showed 10 to 11 Hz sharply contoured alpha activity admixed with spikes in left occipital region which then involved left centro- temporo- parietal region and evolved to 2 to 3 Hz delta slowing. 31 seizures were noted, lasting about 30 seconds to 1 minute each. Seizures without clinical signs were noted arising from right occipital region.  During seizure, EEG showed 10 to 11 Hz sharply contoured alpha activity admixed with spikes in right occipital region which then involved left centro- temporo- parietal region followed by left parieto-occipital region and evolved to 2 to 3 Hz delta slowing . 8 seizures were noted, lasting about 30 seconds to 1 minute each. Hyperventilation and photic stimulation were not performed.    ABNORMALITY -Seizure without clinical signs,  left occipital region -Bilateral independent periodic discharges with overriding fast activity, left and right occipital region  - Continuous slow, generalized  IMPRESSION: This study showed 31 seizures without clinical signs arising from left occipital region and 8 seizures  without clinical signs arising from right occipital region, lasting for 30 seconds to 1 minute each.  Additionally there is evidence of independent epileptogenicity with increased risk of  seizures in right occipital region.  Lastly there is moderate to severe diffuse encephalopathy.  [name]  EEG adult  Result Date: 12/18/2022 [name], MD     12/18/2022  5:27 PM Patient Name: Jaci Desanto [facility] MRN: [id number] Epilepsy Attending: [name] Referring Physician/Provider: Alford Highland, MD Date: 12/18/2022 Duration: 26.12 mins Patient history: 57 year old female past medical history of anxiety, fibromyalgia, chronic pain, IBS, colitis. Presented with acute pancreatitis, colitis, acute metabolic encephalopathy and hypokalemia. EEG to evaluate for seizure Level of alertness:  comatose AEDs during EEG study: None Technical aspects: This EEG study was done with scalp electrodes positioned according to the 10-20 International system of electrode placement. Electrical activity was reviewed with band pass filter of 1-70Hz , sensitivity of 7 uV/mm, display speed of 49mm/sec with a 60Hz  notched filter applied as appropriate. EEG data were recorded continuously and digitally stored.  Video monitoring was available and reviewed as appropriate. Description: EEG showed continuous generalized polymorphic sharply contoured 3 to 6 Hz theta-delta slowing. Bilateral independent epileptiform discharges were noted in left and right occipital region with fluctuating frequency of 1-2hz  , at times rhythmic with evolution in morphology and frequency. Per EEG etch patient was confused and had left eye jerking. 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  Hyperventilation and photic stimulation were not performed.   ABNORMALITY - Focal motor status epilepticus,  left and right occipital region. - Continuous slow, generalized IMPRESSION: This study  showed focal motor status epilepticus arising from left and right occipital region. Clinically patient was confused and had left eye jerking. Additionally there was severe diffuse encephalopathy. Dr. Otelia Limes and Dr Hilton Sinclair were notified. Priyanka Louie Boston M.D. [facility] 12/20/2022, 1:02 PM  Available via Epic secure chat 7am-7pm After 7 pm, please refer to night coverage provider listed on amion.

## 2022-12-20 NOTE — Evaluation (Addendum)
Clinical/Bedside Swallow Evaluation Patient Details  Name: Abigail [name] MRN: [id number] Date of Birth: April 22, 1966  Today's Date: 12/20/2022 Time: SLP Start Time (ACUTE ONLY): 1130 SLP Stop Time (ACUTE ONLY): 1154 SLP Time Calculation (min) (ACUTE ONLY): 24 min  Past Medical History:  Past Medical History:  Diagnosis Date   Anxiety    Arthritis    Back pain    Basal cell carcinoma 10/04/2021   R axilla - needs excised 11/28/21   Basal cell carcinoma 10/04/2021   L antecubital excised 11/14/21   Diarrhea 11/12/2016   Fibromyalgia    Generalized abdominal pain 11/12/2016   H. pylori infection    Hyperlipidemia    IBS (irritable bowel syndrome)    Infectious colitis 04/29/2016   Migraines    Moderate dehydration 04/29/2016   Muscle pain    Opioid overdose (HCC) 12/07/2022   Reflux    Unexplained weight loss 11/12/2016   Past Surgical History:  Past Surgical History:  Procedure Laterality Date   ABDOMINAL HYSTERECTOMY     APPENDECTOMY  2009   C5 FUSION     C6 FUSION     C7 FUSION     COLONOSCOPY  02/2006   COLONOSCOPY WITH PROPOFOL N/A 12/25/2016   Procedure: COLONOSCOPY WITH PROPOFOL;  Surgeon: Earline Mayotte, MD;  Location: [facility] ENDOSCOPY;  Service: Endoscopy;  Laterality: N/A;   ESOPHAGOGASTRODUODENOSCOPY (EGD) WITH PROPOFOL N/A 12/25/2016   Procedure: ESOPHAGOGASTRODUODENOSCOPY (EGD) WITH PROPOFOL;  Surgeon: Earline Mayotte, MD;  Location: [facility] ENDOSCOPY;  Service: Endoscopy;  Laterality: N/A;   FOOT SURGERY     HIP ARTHROPLASTY     L4 FUSION     L5 FUSION     S1 FUSION     HPI:  57 year old Caucasian female history of rheumatoid arthritis, giant cell arteritis on chronic prednisone, chronic pain syndrome on chronic opiates through [name] in [facility], chronic pancreatitis, hypothyroidism, depression who presents as a transfer from [facility] where she was admitted.  She was found to be in focal status epilepticus  during routine EEG testing today.  She was transferred to [facility] for long-term EEG.  Patient unable to give history or review of systems.  No family is available.  History obtained for the patient's chart.     She was admitted on 12/14/2022 at [facility].  Her chief complaint was failure to thrive.  Reportedly by EMS she had not been eating or drinking.  12/18/22 MRI revealed Relatively symmetric bilateral parieto-occipital signal abnormality  with regions of true diffusion restriction. Findings could be seen  in the setting of PRES or severe hypoglycemic metabolic  encephalopathy.  She was encephalopathic on admission; ST consulted to assess swallow function.    Assessment / Plan / Recommendation  Clinical Impression  Pt seen for clinical swallow evaluation with slight oral preparation delay potentially observed d/t xerostomia, (insufficient saliva) as pt has oxygen requirement and xerostomia impacting swallow function.  Initial vocal quality hypophonic and slightly hoarse, but after oral care implemented, this improved significantly.  Pt demonstrated a strong cough and volitionally initiated a dry swallow.  Pt with overall fatigue d/t deconditioning/limited satiety with decreased PO intake, but swallow appeared functional overall.  Recommend initiating a regular/thin liquid diet with pt preferred foods to increase satiety, general swallow precautions in place including slow rate, small bites/sips and upright positioning during and after intake.  Crush meds in puree d/t pt c/o globus sensation with pills ONLY.  ST will f/u briefly for  diet tolerance/education re: swallow/aspiration precautions given deconditioned state.  Thank you for this consult. SLP Visit Diagnosis: Dysphagia, unspecified (R13.10)    Aspiration Risk  Mild aspiration risk    Diet Recommendation   Regular/thin liquids  Medication Administration: Crushed with puree    Other  Recommendations Oral Care Recommendations:  Oral care BID    Recommendations for follow up therapy are one component of a multi-disciplinary discharge planning process, led by the attending physician.  Recommendations may be updated based on patient status, additional functional criteria and insurance authorization.  Follow up Recommendations Follow physician's recommendations for discharge plan and follow up therapies      Assistance Recommended at Discharge  TBD  Functional Status Assessment Patient has had a recent decline in their functional status and demonstrates the ability to make significant improvements in function in a reasonable and predictable amount of time.  Frequency and Duration min 1 x/week  1 week       Prognosis Prognosis for improved oropharyngeal function: Good      Swallow Study   General Date of Onset: 12/18/22 HPI: 57 year old Caucasian female history of rheumatoid arthritis, giant cell arteritis on chronic prednisone, chronic pain syndrome on chronic opiates through [facility] in [facility], chronic pancreatitis, hypothyroidism, depression who presents as a transfer from [name] where she was admitted.  She was found to be in focal status epilepticus during routine EEG testing today.  She was transferred to [facility] for long-term EEG.  Patient unable to give history or review of systems.  No family is available.  History obtained for the patient's chart.     She was admitted on 12/14/2022 at [facility].  Her chief complaint was failure to thrive.  Reportedly by EMS she had not been eating or drinking.  12/18/22 MRI revealed Relatively symmetric bilateral parieto-occipital signal abnormality  with regions of true diffusion restriction. Findings could be seen  in the setting of PRES or severe hypoglycemic metabolic  encephalopathy.  She was encephalopathic on admission; ST consulted to assess swallow function. Type of Study: Bedside Swallow Evaluation Previous  Swallow Assessment: Attempted on 4/30 and 12/19/22, but pt's mentation impaired and BSE not attempted. Diet Prior to this Study: NPO Temperature Spikes Noted: No Respiratory Status: Nasal cannula History of Recent Intubation: No Behavior/Cognition: Alert;Cooperative;Distractible Oral Cavity Assessment: Dry Oral Care Completed by SLP: Yes Oral Cavity - Dentition: Adequate natural dentition Vision: Functional for self-feeding Self-Feeding Abilities: Able to feed self;Needs assist Patient Positioning: Upright in bed Baseline Vocal Quality: Low vocal intensity;Hoarse;Other (comment) (initially, but improved after oral care/sips of thin) Volitional Cough: Strong Volitional Swallow: Able to elicit    Oral/Motor/Sensory Function Overall Oral Motor/Sensory Function: Generalized oral weakness   Ice Chips Ice chips: Impaired Presentation: Spoon Oral Phase Impairments: Reduced lingual movement/coordination Oral Phase Functional Implications: Other (comment) (expelled from oral cavity)   Thin Liquid Thin Liquid: Within functional limits Presentation: Cup;Straw    Nectar Thick Nectar Thick Liquid: Not tested   Honey Thick Honey Thick Liquid: Not tested   Puree Puree: Impaired Presentation: Spoon Oral Phase Functional Implications: Prolonged oral transit   Solid     Solid: Impaired Presentation: Spoon Oral Phase Functional Implications: Prolonged oral transit      Pat Kiki Bivens,M.S., CCC-SLP 12/20/2022,12:24 PM

## 2022-12-20 NOTE — Progress Notes (Signed)
vLTM maintenance  All impedances below 10kohms.  No skin breakdown noted at all skin sites 

## 2022-12-21 ENCOUNTER — Inpatient Hospital Stay (HOSPITAL_COMMUNITY): Payer: Medicare HMO

## 2022-12-21 DIAGNOSIS — G40901 Epilepsy, unspecified, not intractable, with status epilepticus: Secondary | ICD-10-CM | POA: Diagnosis not present

## 2022-12-21 DIAGNOSIS — K861 Other chronic pancreatitis: Secondary | ICD-10-CM | POA: Diagnosis not present

## 2022-12-21 DIAGNOSIS — E43 Unspecified severe protein-calorie malnutrition: Secondary | ICD-10-CM | POA: Insufficient documentation

## 2022-12-21 DIAGNOSIS — M316 Other giant cell arteritis: Secondary | ICD-10-CM | POA: Diagnosis not present

## 2022-12-21 DIAGNOSIS — M069 Rheumatoid arthritis, unspecified: Secondary | ICD-10-CM | POA: Diagnosis not present

## 2022-12-21 LAB — BASIC METABOLIC PANEL
Anion gap: 13 (ref 5–15)
BUN: <5 mg/dL — ABNORMAL LOW (ref 6–20)
CO2: 29 mmol/L (ref 22–32)
Calcium: 8.3 mg/dL — ABNORMAL LOW (ref 8.9–10.3)
Chloride: 94 mmol/L — ABNORMAL LOW (ref 98–111)
Creatinine, Ser: 0.59 mg/dL (ref 0.44–1.00)
GFR, Estimated: >60 mL/min (ref >60)
Glucose, Bld: 140 mg/dL — ABNORMAL HIGH (ref 70–99)
Potassium: 3 mmol/L — ABNORMAL LOW (ref 3.5–5.1)
Sodium: 136 mmol/L (ref 135–145)

## 2022-12-21 LAB — CSF CULTURE W GRAM STAIN: Culture: NO GROWTH

## 2022-12-21 LAB — GLUCOSE, CAPILLARY
Glucose-Capillary: 130 mg/dL — ABNORMAL HIGH (ref 70–99)
Glucose-Capillary: 131 mg/dL — ABNORMAL HIGH (ref 70–99)
Glucose-Capillary: 221 mg/dL — ABNORMAL HIGH (ref 70–99)
Glucose-Capillary: 215 mg/dL — ABNORMAL HIGH (ref 70–99)

## 2022-12-21 LAB — CYTOLOGY - NON PAP

## 2022-12-21 MED ORDER — ORAL CARE MOUTH RINSE: 15.0000 mL | OROMUCOSAL | Status: DC | PRN

## 2022-12-21 MED ORDER — ORAL CARE MOUTH RINSE
15.0000 mL | OROMUCOSAL | Status: DC
  Administered 2022-12-21 – 2022-12-26 (×18): 15 mL via OROMUCOSAL

## 2022-12-21 MED ORDER — POTASSIUM CHLORIDE CRYS ER 20 MEQ PO TBCR: 40.0000 meq | EXTENDED_RELEASE_TABLET | Freq: Three times a day (TID) | ORAL | Status: DC

## 2022-12-21 MED ORDER — PHENYTOIN SODIUM 50 MG/ML IJ SOLN
75.0000 mg | Freq: Three times a day (TID) | INTRAMUSCULAR | Status: DC
  Administered 2022-12-21 – 2022-12-24 (×9): 75 mg via INTRAVENOUS
  Filled 2022-12-21 (×10): qty 1.5

## 2022-12-21 MED ORDER — POTASSIUM CHLORIDE CRYS ER 20 MEQ PO TBCR
40.0000 meq | EXTENDED_RELEASE_TABLET | Freq: Three times a day (TID) | ORAL | Status: AC
  Administered 2022-12-21 (×3): 40 meq via ORAL
  Filled 2022-12-21 (×3): qty 2

## 2022-12-21 NOTE — Progress Notes (Signed)
[facility]                                                                               [employer], is a 57 y.o. female, DOB - 1965/12/27, ZOX:[id number] Admit date - 12/18/2022    Outpatient Primary MD for the patient is [name], Abigail Phillips, MD  LOS - 3  days    Brief summary   57 year old Caucasian female history of rheumatoid arthritis, giant cell arteritis on chronic prednisone, chronic pain syndrome on chronic opiates through [facility] in [facility], chronic pancreatitis, hypothyroidism, depression who presents as a transfer from [facility] where she was admitted. She was found to be in focal status epilepticus during routine EEG testing today. She was transferred to [facility] for long-term EEG.    Assessment & Plan    Assessment and Plan: * Status epilepticus (HCC) Currently on LTM eeg.  Neurology on board .  Loading with  fosphenytoin, and keppra  followed by Keppra , increased to 2000 mg BID,  dilantin 50 mg every 8 hours.  Underwent FL guided LP by IR. Labs sent as per Dr Melynda Ripple.  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  Seizures improving, and further recommendations from neurology to follow.   Acute encephalopathy due to status epilepticus: Improving, she is more alert and oriented and following commands. She refused the CORTRAK placement.  SLP eval recommending regular diet with thin liquids.  Recommend therapy evaluation once LTM EEG is done.    Acute mild colitis:  She was started on IV rocephin and IV flagyl, completed 7 days. She denies any  nausea, vomiting or abdominal pain or diarrhea.  C DIFF antigen and toxin is negative.     Chronic pancreatitis (HCC) Recommend outpatient follow up with GI. Will probably need MRCP at some point.   Hypokalemia Replaced, recheck in am.   Hypothyroidism Patient is not on any thyroid supplements. TSH wnl.   Essential hypertension BP  parameters are well controlled.   RA (rheumatoid arthritis) (HCC) Chronic., on methotrexate at home, has not had any meds in the last one week.   Chronic prescription opiate use - thru [facility] in [place], [place] Was getting oxycodone 20 mg 5 times a day.   Currently on IV morphine 2 mg PRN every 2 hours.   GCA (giant cell arteritis) (HCC) On chronic prednisone  At home. Once the IV solumedrol is completed , will restart her home dose of steroids.     Estimated body mass index is 28.16 kg/m as calculated from the following:   Height as of an earlier encounter on 12/18/22: 5\' 9"  (1.753 m).   Weight as of this encounter: 86.5 kg.  Code Status: full code.  DVT Prophylaxis:  SCDs Start: 12/19/22 0203   Level of Care: Level of care: Progressive Family Communication: none at bedside.   Disposition Plan:     Remains inpatient appropriate:  IV antibiotics, IV steroids.   Procedures:  FL guided LP on 5/1  Consultants:   Neurology.   Antimicrobials:   Anti-infectives (From admission, onward)    Start     Dose/Rate Route Frequency  Ordered Stop   12/19/22 1800  cefTRIAXone (ROCEPHIN) 2 g in sodium chloride 0.9 % 100 mL IVPB  Status:  Discontinued        2 g 200 mL/hr over 30 Minutes Intravenous Every 24 hours 12/19/22 0450 12/21/22 1135   12/19/22 1000  metroNIDAZOLE (FLAGYL) IVPB 500 mg  Status:  Discontinued        500 mg 100 mL/hr over 60 Minutes Intravenous 2 times daily 12/19/22 0450 12/21/22 1135        Medications  Scheduled Meds:  feeding supplement  237 mL Oral BID BM   insulin aspart  0-6 Units Subcutaneous TID WC   multivitamin with minerals  1 tablet Oral Daily   mouth rinse  15 mL Mouth Rinse 4 times per day   phenytoin (DILANTIN) IV  75 mg Intravenous Q8H   [START ON 12/27/2022] thiamine (VITAMIN B1) injection  100 mg Intravenous Daily   Continuous Infusions:  lacosamide (VIMPAT) IV 100 mg (12/21/22 1013)   levETIRAcetam 2,000 mg (12/21/22 1610)    methylPREDNISolone (SOLU-MEDROL) injection 1,000 mg (12/20/22 1406)   thiamine (VITAMIN B1) injection 250 mg (12/21/22 1112)   PRN Meds:.acetaminophen, midazolam, morphine injection, mouth rinse    Subjective:   Abigail [name] was seen and examined today.  Wants to go home, no chest pain or sob, no nausea, vomiting or abdominal pain.   Objective:   Vitals:   12/20/22 1500 12/20/22 2124 12/21/22 0500 12/21/22 0718  BP: 135/87 117/74  (!) 140/72  Pulse: 88 96  88  Resp:  16  18  Temp: 98.4 F (36.9 C) 98.1 F (36.7 C)  98.1 F (36.7 C)  TempSrc: Oral Oral  Oral  SpO2: 100% 96%  94%  Weight:   86.5 kg     Intake/Output Summary (Last 24 hours) at 12/21/2022 1152 Last data filed at 12/21/2022 0853 Gross per 24 hour  Intake 920 ml  Output 800 ml  Net 120 ml    Filed Weights   12/19/22 0333 12/20/22 0500 12/21/22 0500  Weight: 87.5 kg 86.7 kg 86.5 kg     Exam General exam: Appears calm and comfortable  Respiratory system: Clear to auscultation. Respiratory effort normal. Cardiovascular system: S1 & S2 heard, RRR. No JVD,  Gastrointestinal system: Abdomen is nondistended, soft and nontender.  Central nervous system: Alert and oriented. No focal neurological deficits. Extremities: Symmetric 5 x 5 power. Skin: No rashes, Psychiatry: Mood & affect appropriate.       Data Reviewed:  I have personally reviewed following labs and imaging studies   CBC Lab Results  Component Value Date   WBC 7.8 12/20/2022   RBC 4.22 12/20/2022   HGB 12.6 12/20/2022   HCT 36.9 12/20/2022   MCV 87.4 12/20/2022   MCH 29.9 12/20/2022   PLT 150 12/20/2022   MCHC 34.1 12/20/2022   RDW 14.1 12/20/2022   LYMPHSABS 2.0 12/20/2022   MONOABS 0.6 12/20/2022   EOSABS 0.0 12/20/2022   BASOSABS 0.0 12/20/2022     Last metabolic panel Lab Results  Component Value Date   NA 136 12/21/2022   K 3.0 (L) 12/21/2022   CL 94 (L) 12/21/2022   CO2 29 12/21/2022   BUN <5 (L) 12/21/2022    CREATININE 0.59 12/21/2022   GLUCOSE 140 (H) 12/21/2022   GFRNONAA >60 12/21/2022   GFRAA >60 01/15/2015   CALCIUM 8.3 (L) 12/21/2022   PHOS 3.3 12/07/2022   PROT 5.0 (L) 12/19/2022   ALBUMIN 1.8 (L) 12/19/2022  BILITOT 0.9 12/19/2022   ALKPHOS 77 12/19/2022   AST 45 (H) 12/19/2022   ALT 22 12/19/2022   ANIONGAP 13 12/21/2022    CBG (last 3)  Recent Labs    12/20/22 1630 12/20/22 2146 12/21/22 0615  GLUCAP 149* 166* 130*       Coagulation Profile: No results for input(s): "INR", "PROTIME" in the last 168 hours.   Radiology Studies: DG FL GUIDED LUMBAR PUNCTURE  Result Date: 12/19/2022 CLINICAL DATA:  Altered mental status and seizure activity. Request for diagnostic lumbar puncture. EXAM: DIAGNOSTIC LUMBAR PUNCTURE UNDER FLUOROSCOPIC GUIDANCE COMPARISON:  None Available. FLUOROSCOPY: Radiation Exposure Index (as provided by the fluoroscopic device): 1.2 mGy Kerma PROCEDURE: Informed consent was obtained from the patient's mother prior to the procedure, including potential complications of headache, allergy, and pain. With the patient prone, the lower back was prepped with Betadine. 1% Lidocaine was used for local anesthesia. Lumbar puncture was performed at the L2-L3 level using a 20 gauge needle with return of clear, colorless CSF with an opening pressure of 16 cm water. Approximately 10 ml of CSF were obtained for laboratory studies. The patient tolerated the procedure well and there were no apparent complications. IMPRESSION: Technically successful image guided lumbar puncture from the L2-L3 level as above. Procedure performed by Brayton El PA-C supervised by Dr. Gennette Pac Electronically Signed   By: Marin Roberts M.D.   On: 12/19/2022 14:47       Kathlen Mody M.D. [facility] 12/21/2022, 11:52 AM  Available via Epic secure chat 7am-7pm After 7 pm, please refer to night coverage provider listed on amion.

## 2022-12-21 NOTE — Progress Notes (Signed)
Rehook  LTM EEG re-hooked, study is up and running - no initial skin breakdown - push button tested - [facility] monitoring. Hair netting placed.

## 2022-12-21 NOTE — Progress Notes (Signed)
Patient has removed all her leads, has on mittens.  Nurse notified, [facility] notified tech that stopped recording of the study. The study will need to be hooked once a sitter is found for the patient per onsite Neurology Physician.

## 2022-12-21 NOTE — Progress Notes (Signed)
Pt was active with [facility] home health prior to admission for PT/OT.  Will need new HH orders if pt returns home at d/c.  TOC following.

## 2022-12-21 NOTE — Plan of Care (Signed)
  Problem: Coping: Goal: Level of anxiety will decrease Outcome: Progressing   Problem: Elimination: Goal: Will not experience complications related to bowel motility Outcome: Progressing Goal: Will not experience complications related to urinary retention Outcome: Progressing   Problem: Skin Integrity: Goal: Risk for impaired skin integrity will decrease Outcome: Progressing   

## 2022-12-21 NOTE — Procedures (Signed)
Patient Name: Abigail [name] [facility]  MRN: [id number]  Epilepsy Attending: [name]  Referring Physician/Provider: Gordy Councilman, MD  Duration: 12/21/2022 4098 to 12/22/2022 0437   Patient history: 57 year old female past medical history of anxiety, fibromyalgia, chronic pain, IBS, colitis. Presented with acute pancreatitis, colitis, acute metabolic encephalopathy and hypokalemia. EEG to evaluate for seizure   Level of alertness: awake, asleep   AEDs during EEG study: LEV, PHT, LCM   Technical aspects: This EEG study was done with scalp electrodes positioned according to the 10-20 International system of electrode placement. Electrical activity was reviewed with band pass filter of 1-70Hz , sensitivity of 7 uV/mm, display speed of 38mm/sec with a 60Hz  notched filter applied as appropriate. EEG data were recorded continuously and digitally stored.  Video monitoring was available and reviewed as appropriate.   Description: No clear posterior dominant was being seen. Sleep was characterized by sleep symptoms (12 to 14 Hz), maximal frontocentral region. EEG showed continuous generalized polymorphic sharply contoured 3 to 6 Hz theta-delta slowing. Abundant sharp waves were noted in right occipital region. Rare sharp waves were noted in left occipital region.  Seizures without clinical signs were noted arising from right occipital region.  During seizure, EEG showed 10 to 11 Hz sharply contoured alpha activity admixed with spikes in right occipital region which then involved left centro- temporo- parietal region and evolved to 2 to 3 Hz delta slowing and involved left hemisphere. 10 seizures were noted, lasting about 30 seconds to 1 minute each.   Seizures without clinical signs were noted arising from left occipital region.  During seizure, EEG showed 10 to 11 Hz sharply contoured alpha activity admixed with spikes in left occipital region which then involved left centro- temporo- parietal  region and evolved to 2 to 3 Hz delta slowing. 1 seizures were noted, lasting about 30 seconds to 1 minute each.    Hyperventilation and photic stimulation were not performed.      ABNORMALITY -Seizure without clinical signs,  left and right occipital region -Sharp waves, left and right occipital region   - Continuous slow, generalized   IMPRESSION: This study showed 10 seizures without clinical signs arising from right occipital region and 1 seizures without clinical signs arising from left occipital region, lasting for 30 seconds to 1 minute each.  Last seizure was at 2216 on 12/21/2022 arising from right occipital region. Additionally there is evidence of independent epileptogenicity with increased risk of seizures in right >left occipital region.  Lastly there is moderate to severe diffuse encephalopathy.  Seizure frequency appears to be improving compared to previous day.    [name] [name]

## 2022-12-21 NOTE — Progress Notes (Signed)
Subjective: No acute events overnight.  States she is feeling much better.  Requesting to go home.  Started crying when I mention it might take few more days before she is ready for discharge.  ROS: negative except above  Examination  Vital signs in last 24 hours: Temp:  [98.1 F (36.7 C)-98.4 F (36.9 C)] 98.1 F (36.7 C) (05/03 0718) Pulse Rate:  [88-96] 88 (05/03 0718) Resp:  [16-18] 18 (05/03 0718) BP: (117-140)/(72-87) 140/72 (05/03 0718) SpO2:  [94 %-100 %] 94 % (05/03 0718) Weight:  [86.5 kg] 86.5 kg (05/03 0500)  General: lying in bed, NAD Neuro: MS: Alert, oriented, follows commands CN: pupils equal and reactive,  EOMI, face symmetric, tongue midline, normal sensation over face Motor: 4/5 strength in all 4 extremities Coordination: normal Gait: not tested  Basic Metabolic Panel: Recent Labs  Lab 12/14/22 1836 12/14/22 2236 12/16/22 0858 12/17/22 0557 12/18/22 0632 12/19/22 0300 12/20/22 0000 12/20/22 1059 12/21/22 0721  NA  --    < > 135   < > 135 135 136 135 136  K  --    < > 3.1*   < > 2.4* 2.8* 2.8* 3.3* 3.0*  CL  --    < > 96*   < > 95* 98 95* 97* 94*  CO2  --    < > 29   < > 28 25 27 28 29   GLUCOSE  --    < > 88   < > 100* 105* 91 75 140*  BUN  --    < > <5*   < > <5* <5* <5* <5* <5*  CREATININE  --    < > 0.52   < > 0.52 0.54 0.47 0.46 0.59  CALCIUM  --    < > 7.9*   < > 7.4* 7.5* 7.9* 7.6* 8.3*  MG 1.8  --  1.8  --  1.8 2.0 2.2  --   --    < > = values in this interval not displayed.    CBC: Recent Labs  Lab 12/15/22 0424 12/16/22 0858 12/17/22 0557 12/18/22 0632 12/20/22 0000  WBC 7.5 8.5 10.7* 14.7* 7.8  NEUTROABS  --   --   --   --  5.1  HGB 10.4* 10.9* 11.8* 10.6* 12.6  HCT 31.5* 33.4* 35.3* 31.2* 36.9  MCV 90.5 90.8 88.3 86.9 87.4  PLT 200 210 223 193 150     Coagulation Studies: No results for input(s): "LABPROT", "INR" in the last 72 hours.  Imaging No new brain imaging.  ASSESSMENT AND PLAN: 57 year old female presented  to [facility] hospital on 12/14/2022 with altered mental status.  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  Therefore she was transferred to [facility] for long-term EEG.   Focal convulsive status epilepticus. resolved Focal seizures -Unclear etiology of focal status epilepticus.  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  Can consider doing 5  days depending on EEG and clinical findings -Continuous EEG while we adjust anti-seizure medications -Continue seizure precautions As needed IV Versed for clinical seizures -Management of rest of comorbidities per primary team -Discussed plan with Dr. Blake Divine via secure chat   I have spent a total of   36 minutes with the patient reviewing hospital notes,  test results, labs and examining the patient as well as establishing an assessment and plan that was discussed personally with the patient.  > 50% of time was spent in direct patient care.   Lindie Spruce Epilepsy [facility] For questions after 5pm please refer to AMION to reach the Neurologist on call

## 2022-12-22 DIAGNOSIS — G40901 Epilepsy, unspecified, not intractable, with status epilepticus: Secondary | ICD-10-CM | POA: Diagnosis not present

## 2022-12-22 DIAGNOSIS — M316 Other giant cell arteritis: Secondary | ICD-10-CM | POA: Diagnosis not present

## 2022-12-22 DIAGNOSIS — M069 Rheumatoid arthritis, unspecified: Secondary | ICD-10-CM | POA: Diagnosis not present

## 2022-12-22 DIAGNOSIS — K861 Other chronic pancreatitis: Secondary | ICD-10-CM | POA: Diagnosis not present

## 2022-12-22 LAB — BASIC METABOLIC PANEL
Anion gap: 5 (ref 5–15)
BUN: 7 mg/dL (ref 6–20)
CO2: 31 mmol/L (ref 22–32)
Calcium: 8.2 mg/dL — ABNORMAL LOW (ref 8.9–10.3)
Chloride: 99 mmol/L (ref 98–111)
Creatinine, Ser: 0.46 mg/dL (ref 0.44–1.00)
GFR, Estimated: >60 mL/min (ref >60)
Glucose, Bld: 158 mg/dL — ABNORMAL HIGH (ref 70–99)
Potassium: 3.9 mmol/L (ref 3.5–5.1)
Sodium: 135 mmol/L (ref 135–145)

## 2022-12-22 LAB — GLUCOSE, CAPILLARY
Glucose-Capillary: 145 mg/dL — ABNORMAL HIGH (ref 70–99)
Glucose-Capillary: 226 mg/dL — ABNORMAL HIGH (ref 70–99)
Glucose-Capillary: 206 mg/dL — ABNORMAL HIGH (ref 70–99)
Glucose-Capillary: 252 mg/dL — ABNORMAL HIGH (ref 70–99)

## 2022-12-22 LAB — PHENYTOIN LEVEL, TOTAL: Phenytoin Lvl: 4.4 ug/mL — ABNORMAL LOW (ref 10.0–20.0)

## 2022-12-22 LAB — CSF CULTURE W GRAM STAIN

## 2022-12-22 NOTE — Progress Notes (Signed)
Neurology progress note  Subjective: Awake alert in no distress except for the fact that she does not like being in restraints.  Requests to go home.  ROS: negative except above  Examination  Vital signs in last 24 hours: Temp:  [97.7 F (36.5 C)-98.4 F (36.9 C)] 97.8 F (36.6 C) (05/04 0335) Pulse Rate:  [90-105] 92 (05/04 0335) Resp:  [17-18] 18 (05/04 0335) BP: (120-140)/(51-83) 139/83 (05/04 0335) SpO2:  [95 %-100 %] 95 % (05/04 0335)  General: lying in bed, NAD Neuro: MS: Alert, oriented, follows commands CN: pupils equal and reactive,  EOMI, face symmetric, tongue midline, normal sensation over face Motor: 4+/5 strength in all 4 extremities Coordination: normal Gait: not tested  Basic Metabolic Panel: Recent Labs  Lab 12/16/22 0858 12/17/22 0557 12/18/22 0632 12/19/22 0300 12/20/22 0000 12/20/22 1059 12/21/22 0721 12/22/22 0426  NA 135   < > 135 135 136 135 136 135  K 3.1*   < > 2.4* 2.8* 2.8* 3.3* 3.0* 3.9  CL 96*   < > 95* 98 95* 97* 94* 99  CO2 29   < > 28 25 27 28 29 31   GLUCOSE 88   < > 100* 105* 91 75 140* 158*  BUN <5*   < > <5* <5* <5* <5* <5* 7  CREATININE 0.52   < > 0.52 0.54 0.47 0.46 0.59 0.46  CALCIUM 7.9*   < > 7.4* 7.5* 7.9* 7.6* 8.3* 8.2*  MG 1.8  --  1.8 2.0 2.2  --   --   --    < > = values in this interval not displayed.     CBC: Recent Labs  Lab 12/16/22 0858 12/17/22 0557 12/18/22 0632 12/20/22 0000  WBC 8.5 10.7* 14.7* 7.8  NEUTROABS  --   --   --  5.1  HGB 10.9* 11.8* 10.6* 12.6  HCT 33.4* 35.3* 31.2* 36.9  MCV 90.8 88.3 86.9 87.4  PLT 210 223 193 150   CSF findings Glucose 71 RBC 235 WBC 1 Protein 93. Cytology unremarkable CSF Gram stain unremarkable.  Cultures negative thus far  Serum and CSF autoimmune panel pending  Imaging MRI brain on 12/18/2022 with relatively symmetric bilateral parieto-occipital signal abnormality with regions of 2 diffusion restriction, posterior reversible encephalopathy syndrome versus  severe hypoglycemic metabolic encephalopathy.  ASSESSMENT 57 year old female presented to [facility] on 12/14/2022 with altered mental status.  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  Therefore she was transferred to [facility] for long-term EEG.   Focal convulsive status epilepticus. resolved Focal seizures -Unclear etiology of focal status epilepticus.  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  Check level. -Continue Keppra 2000 mg twice daily -Increase Vimpat to 150 mg twice daily - Can add clonazepam 0.5mg  BID if frequent seizures - On IV solumedrol 1000mg  D2/3 day. EEG imprvoving. Will decide if needs 2 more days as we monitor her and EEG -Continuous EEG while we adjust anti-seizure medications -Continue seizure precautions As needed IV Versed for clinical seizures -Management of rest of comorbidities per primary team -Discussed plan with Dr. Blake Divine via secure chat   -- Milon Dikes, MD Neurologist [facility] Pager:  [phone]  

## 2022-12-22 NOTE — Procedures (Signed)
Patient Name: Abigail [name] [facility]  MRN: [id number]  Epilepsy Attending: [name]  Referring Physician/Provider: Gordy Councilman, MD  Duration: 12/22/2022 6295 to 12/23/2022  0437   Patient history: 57 year old female past medical history of anxiety, fibromyalgia, chronic pain, IBS, colitis. Presented with acute pancreatitis, colitis, acute metabolic encephalopathy and hypokalemia. EEG to evaluate for seizure   Level of alertness: awake, asleep   AEDs during EEG study: LEV, PHT, LCM   Technical aspects: This EEG study was done with scalp electrodes positioned according to the 10-20 International system of electrode placement. Electrical activity was reviewed with band pass filter of 1-70Hz , sensitivity of 7 uV/mm, display speed of 47mm/sec with a 60Hz  notched filter applied as appropriate. EEG data were recorded continuously and digitally stored.  Video monitoring was available and reviewed as appropriate.   Description: No clear posterior dominant was being seen. Sleep was characterized by sleep symptoms (12 to 14 Hz), maximal frontocentral region. EEG showed continuous generalized polymorphic sharply contoured 3 to 6 Hz theta-delta slowing. Abundant sharp waves were noted in right occipital region.  Seven seizure without clinical signs were noted arising from right occipital region. During seizure, EEG showed 10 to 11 Hz sharply contoured alpha activity admixed with spikes in right occipital region which then involved left centro- temporo- parietal region and evolved to 2 to 3 Hz delta slowing and involved left hemisphere. Duration of seizure was about 1-2 minutes. Last seizure was on 5/5/ 2024 at 0247.   Hyperventilation and photic stimulation were not performed.      ABNORMALITY - Seizure without clinical signs, right occipital region  - Sharp waves, right occipital region   - Continuous slow, generalized   IMPRESSION: This study showed 7 seizures without clinical signs  arising from right occipital region, lasting about 1-2 minutes. Last seizure was on 5/5/ 2024 at 0247. Additionally there is moderate to severe diffuse encephalopathy.   EEG significantly improved compared to previous day   [name]

## 2022-12-22 NOTE — Progress Notes (Signed)
EEG maint complete.  ?

## 2022-12-22 NOTE — Progress Notes (Signed)
[facility]                                                                               [employer], is a 57 y.o. female, DOB - 1966/01/21, WGN:[id number] Admit date - 12/18/2022    Outpatient Primary MD for the patient is [name], Abigail Phillips, MD  LOS - 4  days    Brief summary   57 year old Caucasian female history of rheumatoid arthritis, giant cell arteritis on chronic prednisone, chronic pain syndrome on chronic opiates through [facility] in [facility], chronic pancreatitis, hypothyroidism, depression who presents as a transfer from [facility] where she was admitted. She was found to be in focal status epilepticus during routine EEG testing today. She was transferred to [facility] for long-term EEG.    Assessment & Plan    Assessment and Plan: * Status epilepticus (HCC) Currently on LTM eeg.  Neurology on board .  Loading with  fosphenytoin, and keppra  followed by Keppra , increased to 2000 mg BID, Vimpat 100 mg BID added, phenytoin  75 mg TID. Phenytoin level 4.4 ( still low) Underwent FL guided LP by IR. Labs PENDING. Gram stain negative. Cultures negative so far.  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  Seizures improving, and further recommendations from neurology to follow.   Acute encephalopathy due to status epilepticus: She is more lethargic this morning,  but answering questions appropriately.  SLP eval recommending regular diet with thin liquids.  Recommend therapy evaluation once LTM EEG is done.    Acute mild colitis:  She was started on IV rocephin and IV flagyl, completed 7 days. She denies any  nausea, vomiting or abdominal pain or diarrhea.  C DIFF antigen and toxin is negative.    Chronic pancreatitis (HCC) Recommend outpatient follow up with GI. Will probably need MRCP at some point.   Hypokalemia Replaced, recheck in am.   Hypothyroidism Patient is  not on any thyroid supplements. TSH wnl.   Essential hypertension BP parameters are optimal.   RA (rheumatoid arthritis) (HCC) Chronic., on methotrexate at home, has not had any meds in the last one week.   Chronic prescription opiate use - thru [facility] in [place], [place] Was getting oxycodone 20 mg 5 times a day.   Currently on IV morphine 2 mg PRN every 2 hours.   GCA (giant cell arteritis) (HCC) On chronic prednisone  At home. Once the IV solumedrol is completed , will restart her home dose of steroids.     Estimated body mass index is 28.16 kg/m as calculated from the following:   Height as of an earlier encounter on 12/18/22: 5\' 9"  (1.753 m).   Weight as of this encounter: 86.5 kg.  Code Status: full code.  DVT Prophylaxis:  SCDs Start: 12/19/22 0203   Level of Care: Level of care: Progressive Family Communication: none at bedside.   Disposition Plan:     Remains inpatient appropriate:  IV antibiotics, IV steroids.   Procedures:  FL guided LP on 5/1  Consultants:   Neurology.   Antimicrobials:   Anti-infectives (From admission, onward)  Start     Dose/Rate Route Frequency Ordered Stop   12/19/22 1800  cefTRIAXone (ROCEPHIN) 2 g in sodium chloride 0.9 % 100 mL IVPB  Status:  Discontinued        2 g 200 mL/hr over 30 Minutes Intravenous Every 24 hours 12/19/22 0450 12/21/22 1135   12/19/22 1000  metroNIDAZOLE (FLAGYL) IVPB 500 mg  Status:  Discontinued        500 mg 100 mL/hr over 60 Minutes Intravenous 2 times daily 12/19/22 0450 12/21/22 1135        Medications  Scheduled Meds:  feeding supplement  237 mL Oral BID BM   insulin aspart  0-6 Units Subcutaneous TID WC   multivitamin with minerals  1 tablet Oral Daily   mouth rinse  15 mL Mouth Rinse 4 times per day   phenytoin (DILANTIN) IV  75 mg Intravenous Q8H   [START ON 12/27/2022] thiamine (VITAMIN B1) injection  100 mg Intravenous Daily   Continuous Infusions:  lacosamide (VIMPAT) IV 100 mg  (12/22/22 1105)   levETIRAcetam 2,000 mg (12/22/22 0902)   thiamine (VITAMIN B1) injection 250 mg (12/22/22 1143)   PRN Meds:.acetaminophen, midazolam, morphine injection, mouth rinse    Subjective:   Abigail [name] was seen and examined today.  Continues to say the same that she wants to go home.  No new complaints.  Objective:   Vitals:   12/21/22 2359 12/22/22 0335 12/22/22 0853 12/22/22 1232  BP: 128/61 139/83 138/86 (!) 142/69  Pulse: 90 92 84 100  Resp:  18 18 20   Temp: 97.7 F (36.5 C) 97.8 F (36.6 C) 97.6 F (36.4 C) 98.3 F (36.8 C)  TempSrc: Oral Axillary Oral Oral  SpO2: 100% 95% 95% 100%  Weight:        Intake/Output Summary (Last 24 hours) at 12/22/2022 1454 Last data filed at 12/21/2022 1625 Gross per 24 hour  Intake 731.58 ml  Output --  Net 731.58 ml    Filed Weights   12/19/22 0333 12/20/22 0500 12/21/22 0500  Weight: 87.5 kg 86.7 kg 86.5 kg     Exam General exam: Appears calm and comfortable  Respiratory system: Clear to auscultation. Respiratory effort normal. Cardiovascular system: S1 & S2 heard, RRR. No JVD, Gastrointestinal system: Abdomen is nondistended, soft and nontender.  Central nervous system: pt is sleepy , but opens eyes to answer questions.  Extremities: Symmetric 5 x 5 power. Skin: No rashes,  Psychiatry:  Mood & affect appropriate.        Data Reviewed:  I have personally reviewed following labs and imaging studies   CBC Lab Results  Component Value Date   WBC 7.8 12/20/2022   RBC 4.22 12/20/2022   HGB 12.6 12/20/2022   HCT 36.9 12/20/2022   MCV 87.4 12/20/2022   MCH 29.9 12/20/2022   PLT 150 12/20/2022   MCHC 34.1 12/20/2022   RDW 14.1 12/20/2022   LYMPHSABS 2.0 12/20/2022   MONOABS 0.6 12/20/2022   EOSABS 0.0 12/20/2022   BASOSABS 0.0 12/20/2022     Last metabolic panel Lab Results  Component Value Date   NA 135 12/22/2022   K 3.9 12/22/2022   CL 99 12/22/2022   CO2 31 12/22/2022   BUN 7 12/22/2022    CREATININE 0.46 12/22/2022   GLUCOSE 158 (H) 12/22/2022   GFRNONAA >60 12/22/2022   GFRAA >60 01/15/2015   CALCIUM 8.2 (L) 12/22/2022   PHOS 3.3 12/07/2022   PROT 5.0 (L) 12/19/2022   ALBUMIN 1.8 (L)  12/19/2022   BILITOT 0.9 12/19/2022   ALKPHOS 77 12/19/2022   AST 45 (H) 12/19/2022   ALT 22 12/19/2022   ANIONGAP 5 12/22/2022    CBG (last 3)  Recent Labs    12/21/22 2116 12/22/22 0616 12/22/22 1230  GLUCAP 215* 145* 226*       Coagulation Profile: No results for input(s): "INR", "PROTIME" in the last 168 hours.   Radiology Studies: No results found.     Kathlen Mody M.D. [facility] 12/22/2022, 2:54 PM  Available via Epic secure chat 7am-7pm After 7 pm, please refer to night coverage provider listed on amion.

## 2022-12-22 NOTE — Plan of Care (Signed)
  Problem: Clinical Measurements: Goal: Respiratory complications will improve Outcome: Progressing   Problem: Nutrition: Goal: Adequate nutrition will be maintained Outcome: Progressing   Problem: Elimination: Goal: Will not experience complications related to bowel motility Outcome: Progressing Goal: Will not experience complications related to urinary retention Outcome: Progressing   Problem: Skin Integrity: Goal: Risk for impaired skin integrity will decrease Outcome: Progressing

## 2022-12-23 DIAGNOSIS — M069 Rheumatoid arthritis, unspecified: Secondary | ICD-10-CM | POA: Diagnosis not present

## 2022-12-23 DIAGNOSIS — G40901 Epilepsy, unspecified, not intractable, with status epilepticus: Secondary | ICD-10-CM | POA: Diagnosis not present

## 2022-12-23 DIAGNOSIS — M316 Other giant cell arteritis: Secondary | ICD-10-CM | POA: Diagnosis not present

## 2022-12-23 DIAGNOSIS — K861 Other chronic pancreatitis: Secondary | ICD-10-CM | POA: Diagnosis not present

## 2022-12-23 LAB — GLUCOSE, CAPILLARY
Glucose-Capillary: 105 mg/dL — ABNORMAL HIGH (ref 70–99)
Glucose-Capillary: 182 mg/dL — ABNORMAL HIGH (ref 70–99)
Glucose-Capillary: 362 mg/dL — ABNORMAL HIGH (ref 70–99)
Glucose-Capillary: 243 mg/dL — ABNORMAL HIGH (ref 70–99)

## 2022-12-23 MED ORDER — SODIUM CHLORIDE 0.9 % IV SOLN
1000.0000 mg | Freq: Every day | INTRAVENOUS | Status: AC
  Administered 2022-12-23 – 2022-12-24 (×2): 1000 mg via INTRAVENOUS
  Filled 2022-12-23 (×2): qty 16

## 2022-12-23 MED ORDER — LORAZEPAM 0.5 MG PO TABS
0.2500 mg | ORAL_TABLET | Freq: Once | ORAL | Status: AC
  Administered 2022-12-23: 0.25 mg via ORAL
  Filled 2022-12-23: qty 1

## 2022-12-23 MED ORDER — SODIUM CHLORIDE 0.9 % IV SOLN
200.0000 mg | Freq: Two times a day (BID) | INTRAVENOUS | Status: DC
  Administered 2022-12-23 – 2022-12-24 (×3): 200 mg via INTRAVENOUS
  Filled 2022-12-23 (×4): qty 20

## 2022-12-23 NOTE — Progress Notes (Signed)
Patient crying, looks sad requesting to let her go home, complained of pain , HR going up above 110 , gave her PRN inj Morphine, Patient also wanted something to make her relax and sleep, gave ativan 0.25 mg as ordered, will continue to monitor

## 2022-12-23 NOTE — Plan of Care (Signed)
  Problem: Elimination: Goal: Will not experience complications related to bowel motility Outcome: Progressing Goal: Will not experience complications related to urinary retention Outcome: Progressing   Problem: Skin Integrity: Goal: Risk for impaired skin integrity will decrease Outcome: Progressing   

## 2022-12-23 NOTE — Progress Notes (Signed)
Neurology progress note  Subjective: Sleepy but awakens to voice.  Appears a little more lethargic.  Was able to cooperate with exam ROS: negative except above  Examination  Vital signs in last 24 hours: Temp:  [97.6 F (36.4 C)-98.8 F (37.1 C)] 98.7 F (37.1 C) (05/05 0818) Pulse Rate:  [81-110] 92 (05/05 0818) Resp:  [16-20] 17 (05/05 0818) BP: (129-142)/(69-93) 135/72 (05/05 0818) SpO2:  [93 %-100 %] 93 % (05/05 0818)  General: lying in bed, NAD Neuro: MS: Sleeping in bed.  Awakens to voice.  He was a little more lethargic but is able to participate in the exam without a problem. CN: pupils equal and reactive,  EOMI, face symmetric, tongue midline, normal sensation over face Motor: 4+/5 strength in all 4 extremities Coordination: normal Gait: not tested  Basic Metabolic Panel: Recent Labs  Lab 12/18/22 0632 12/19/22 0300 12/20/22 0000 12/20/22 1059 12/21/22 0721 12/22/22 0426  NA 135 135 136 135 136 135  K 2.4* 2.8* 2.8* 3.3* 3.0* 3.9  CL 95* 98 95* 97* 94* 99  CO2 28 25 27 28 29 31   GLUCOSE 100* 105* 91 75 140* 158*  BUN <5* <5* <5* <5* <5* 7  CREATININE 0.52 0.54 0.47 0.46 0.59 0.46  CALCIUM 7.4* 7.5* 7.9* 7.6* 8.3* 8.2*  MG 1.8 2.0 2.2  --   --   --      CBC: Recent Labs  Lab 12/17/22 0557 12/18/22 0632 12/20/22 0000  WBC 10.7* 14.7* 7.8  NEUTROABS  --   --  5.1  HGB 11.8* 10.6* 12.6  HCT 35.3* 31.2* 36.9  MCV 88.3 86.9 87.4  PLT 223 193 150   CSF findings Glucose 71 RBC 235 WBC 1 Protein 93. Cytology unremarkable CSF Gram stain unremarkable.  Cultures negative thus far  Serum and CSF autoimmune panel pending   Overnight EEG ABNORMALITY - Seizure without clinical signs, right occipital region  - Sharp waves, right occipital region   - Continuous slow, generalized  IMPRESSION: This study showed 7 seizures without clinical signs arising from right occipital region, lasting about 1-2 minutes. Additionally there is moderate to severe  diffuse encephalopathy.   EEG significantly improved compared to previous day  Imaging MRI brain on 12/18/2022 with relatively symmetric bilateral parieto-occipital signal abnormality with regions of 2 diffusion restriction, posterior reversible encephalopathy syndrome versus severe hypoglycemic metabolic encephalopathy.  ASSESSMENT 57 year old female presented to [facility] on 12/14/2022 with altered mental status.  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  Therefore she was transferred to [facility] for long-term EEG.   Focal convulsive status epilepticus. resolved Focal seizures -Unclear etiology of focal status epilepticus.  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  Suspect antiepileptic side effects.  Will adjust Keppra to a lower dose and increase Vimpat to higher dose.   Recommendations -On Dilantin 75 mg every 8 hours. Current level 4.4 with albumin corrects to 9.3-continue with current dose. -She is on Keppra 2000 mg twice daily.  I am going to reduce the dose to Keppra 1500 mg twice daily given some increasing lethargy which may be because of high doses of Keppra. -Increase Vimpat to 200 mg twice daily. -  Can add clonazepam 0.5mg  BID if frequent seizure -Received Solu-Medrol 1 g for 3 days.  Will do 2 more days to complete 5 days of IV Solu-Medrol. -Continuous EEG while we adjust anti-seizure medications -Continue seizure precautions As needed IV Versed for clinical seizures -Management of rest of comorbidities per primary team -Discussed plan with Dr. Blake Divine via secure chat   -- Milon Dikes,  MD Neurologist [facility] Pager: [phone]

## 2022-12-23 NOTE — Progress Notes (Signed)
[facility]                                                                               [employer], is a 57 y.o. female, DOB - Oct 29, 1965, [id number] Admit date - 12/18/2022    Outpatient Primary MD for the patient is [name], [name], MD  LOS - 5  days    Brief summary   57 year old Caucasian female history of rheumatoid arthritis, giant cell arteritis on chronic prednisone, chronic pain syndrome on chronic opiates through [facility] in [facility], chronic pancreatitis, hypothyroidism, depression who presents as a transfer from [facility] where she was admitted. She was found to be in focal status epilepticus during routine EEG testing today. She was transferred to [facility] for long-term EEG.    Assessment & Plan    Assessment and Plan: * Status epilepticus (HCC) Currently on LTM eeg.  Neurology on board .  Loading with  fosphenytoin, and keppra,  followed by Keppra , increased to 2000 mg BID, Vimpat 200 mg BID added, phenytoin  75 mg TID. Phenytoin level 4.4 ( still low) Underwent FL guided LP by IR. Labs PENDING. Gram stain negative. Cultures negative so far.  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  Seizures improving, and further recommendations from neurology to follow.   Acute encephalopathy due to status epilepticus: She is alert and recognized family and relatives. Wanted to go home.  SLP eval recommending regular diet with thin liquids.  Recommend therapy evaluation once LTM EEG is done.    Acute mild colitis:  She was started on IV rocephin and IV flagyl, completed 7 days. She denies any  nausea, vomiting or abdominal pain or diarrhea.  C DIFF antigen and toxin is negative.    Chronic pancreatitis (HCC) Recommend outpatient follow up with GI. Will probably need MRCP at some point.   Hypokalemia Replaced. Repeat levels wnl.   Hypothyroidism Patient is  not on any thyroid supplements. TSH wnl.   Essential hypertension BP parameters are well controlled.   RA (rheumatoid arthritis) (HCC) Chronic., on methotrexate at home, has not had any meds in the last one week.   Chronic prescription opiate use - thru [facility] in [place], [place] Was getting oxycodone 20 mg 5 times a day.   Currently on IV morphine 2 mg PRN every 2 hours.   GCA (giant cell arteritis) (HCC) On chronic prednisone  At home. Once the IV solumedrol is completed , will restart her home dose of steroids.    Hyperglycemia is from steroids .  A1c will be ordered    Tachycardia,   Denies any pain. Repeated says wants to go home.  EKG ordered.     Estimated body mass index is 28.16 kg/m as calculated from the following:   Height as of an earlier encounter on 12/18/22: 5\' 9"  (1.753 m).   Weight as of this encounter: 86.5 kg.  Code Status: full code.  DVT Prophylaxis:  SCDs Start: 12/19/22 0203   Level of Care: Level of care: Progressive Family Communication: none at bedside.   Disposition Plan:  Remains inpatient appropriate:  IV solumedrol.   Procedures:  FL guided LP on 5/1  Consultants:   Neurology.   Antimicrobials:   Anti-infectives (From admission, onward)    Start     Dose/Rate Route Frequency Ordered Stop   12/19/22 1800  cefTRIAXone (ROCEPHIN) 2 g in sodium chloride 0.9 % 100 mL IVPB  Status:  Discontinued        2 g 200 mL/hr over 30 Minutes Intravenous Every 24 hours 12/19/22 0450 12/21/22 1135   12/19/22 1000  metroNIDAZOLE (FLAGYL) IVPB 500 mg  Status:  Discontinued        500 mg 100 mL/hr over 60 Minutes Intravenous 2 times daily 12/19/22 0450 12/21/22 1135        Medications  Scheduled Meds:  feeding supplement  237 mL Oral BID BM   insulin aspart  0-6 Units Subcutaneous TID WC   multivitamin with minerals  1 tablet Oral Daily   mouth rinse  15 mL Mouth Rinse 4 times per day   phenytoin (DILANTIN) IV  75 mg Intravenous Q8H    [START ON 12/27/2022] thiamine (VITAMIN B1) injection  100 mg Intravenous Daily   Continuous Infusions:  lacosamide (VIMPAT) IV Stopped (12/23/22 1143)   levETIRAcetam Stopped (12/23/22 0927)   methylPREDNISolone (SOLU-MEDROL) injection Stopped (12/23/22 1102)   thiamine (VITAMIN B1) injection Stopped (12/23/22 1255)   PRN Meds:.acetaminophen, midazolam, morphine injection, mouth rinse    Subjective:   Abigail [name] was seen and examined today.  Denies any abdominal pain, nausea . Poor oral intake.  Wants to go home. Crying.  Objective:   Vitals:   12/23/22 0818 12/23/22 1137 12/23/22 1653 12/23/22 1700  BP: 135/72 131/78 (!) 143/97 (!) 140/90  Pulse: 92 95 (!) 119 (!) 110  Resp: 17 16    Temp: 98.7 F (37.1 C) 98.8 F (37.1 C) 98.9 F (37.2 C) 98.2 F (36.8 C)  TempSrc: Oral Oral Oral Oral  SpO2: 93% 90% 92%   Weight:        Intake/Output Summary (Last 24 hours) at 12/23/2022 1932 Last data filed at 12/23/2022 1528 Gross per 24 hour  Intake 862.66 ml  Output 2150 ml  Net -1287.34 ml    Filed Weights   12/19/22 0333 12/20/22 0500 12/21/22 0500  Weight: 87.5 kg 86.7 kg 86.5 kg     Exam General exam: ill appearing on LTM EEG.  Respiratory system: Clear to auscultation. Respiratory effort normal. Cardiovascular system: S1 & S2 heard, tachycardic. No JVD,  Gastrointestinal system: Abdomen is nondistended, soft and nontender.  Central nervous system: Alert and oriented, able to move all extremities.  Extremities: no pedal edema.  Skin: No rashes, Psychiatry: appears depressed.         Data Reviewed:  I have personally reviewed following labs and imaging studies   CBC Lab Results  Component Value Date   WBC 7.8 12/20/2022   RBC 4.22 12/20/2022   HGB 12.6 12/20/2022   HCT 36.9 12/20/2022   MCV 87.4 12/20/2022   MCH 29.9 12/20/2022   PLT 150 12/20/2022   MCHC 34.1 12/20/2022   RDW 14.1 12/20/2022   LYMPHSABS 2.0 12/20/2022   MONOABS 0.6 12/20/2022    EOSABS 0.0 12/20/2022   BASOSABS 0.0 12/20/2022     Last metabolic panel Lab Results  Component Value Date   NA 135 12/22/2022   K 3.9 12/22/2022   CL 99 12/22/2022   CO2 31 12/22/2022   BUN 7 12/22/2022   CREATININE 0.46 12/22/2022  GLUCOSE 158 (H) 12/22/2022   GFRNONAA >60 12/22/2022   GFRAA >60 01/15/2015   CALCIUM 8.2 (L) 12/22/2022   PHOS 3.3 12/07/2022   PROT 5.0 (L) 12/19/2022   ALBUMIN 1.8 (L) 12/19/2022   BILITOT 0.9 12/19/2022   ALKPHOS 77 12/19/2022   AST 45 (H) 12/19/2022   ALT 22 12/19/2022   ANIONGAP 5 12/22/2022    CBG (last 3)  Recent Labs    12/23/22 0627 12/23/22 1144 12/23/22 1652  GLUCAP 105* 182* 362*       Coagulation Profile: No results for input(s): "INR", "PROTIME" in the last 168 hours.   Radiology Studies: No results found.     Kathlen Mody M.D. [facility] 12/23/2022, 7:32 PM  Available via Epic secure chat 7am-7pm After 7 pm, please refer to night coverage provider listed on amion.

## 2022-12-24 ENCOUNTER — Telehealth (HOSPITAL_COMMUNITY): Payer: Self-pay | Admitting: Pharmacy Technician

## 2022-12-24 ENCOUNTER — Inpatient Hospital Stay (HOSPITAL_COMMUNITY): Payer: Medicare HMO

## 2022-12-24 ENCOUNTER — Other Ambulatory Visit (HOSPITAL_COMMUNITY): Payer: Self-pay

## 2022-12-24 DIAGNOSIS — K861 Other chronic pancreatitis: Secondary | ICD-10-CM | POA: Diagnosis not present

## 2022-12-24 DIAGNOSIS — M069 Rheumatoid arthritis, unspecified: Secondary | ICD-10-CM | POA: Diagnosis not present

## 2022-12-24 DIAGNOSIS — M316 Other giant cell arteritis: Secondary | ICD-10-CM | POA: Diagnosis not present

## 2022-12-24 DIAGNOSIS — G40901 Epilepsy, unspecified, not intractable, with status epilepticus: Secondary | ICD-10-CM | POA: Diagnosis not present

## 2022-12-24 LAB — COMPREHENSIVE METABOLIC PANEL
ALT: 20 U/L (ref 0–44)
AST: 21 U/L (ref 15–41)
Albumin: 2.4 g/dL — ABNORMAL LOW (ref 3.5–5.0)
Alkaline Phosphatase: 86 U/L (ref 38–126)
Anion gap: 8 (ref 5–15)
BUN: <5 mg/dL — ABNORMAL LOW (ref 6–20)
CO2: 31 mmol/L (ref 22–32)
Calcium: 8.4 mg/dL — ABNORMAL LOW (ref 8.9–10.3)
Chloride: 97 mmol/L — ABNORMAL LOW (ref 98–111)
Creatinine, Ser: 0.51 mg/dL (ref 0.44–1.00)
GFR, Estimated: >60 mL/min (ref >60)
Glucose, Bld: 184 mg/dL — ABNORMAL HIGH (ref 70–99)
Potassium: 3.8 mmol/L (ref 3.5–5.1)
Sodium: 136 mmol/L (ref 135–145)
Total Bilirubin: 0.3 mg/dL (ref 0.3–1.2)
Total Protein: 5.6 g/dL — ABNORMAL LOW (ref 6.5–8.1)

## 2022-12-24 LAB — CBC WITH DIFFERENTIAL/PLATELET
Abs Immature Granulocytes: 0.39 10*3/uL — ABNORMAL HIGH (ref 0.00–0.07)
Basophils Absolute: 0.1 10*3/uL (ref 0.0–0.1)
Basophils Relative: 0 %
Eosinophils Absolute: 0 10*3/uL (ref 0.0–0.5)
Eosinophils Relative: 0 %
HCT: 34.1 % — ABNORMAL LOW (ref 36.0–46.0)
Hemoglobin: 11.6 g/dL — ABNORMAL LOW (ref 12.0–15.0)
Immature Granulocytes: 2 %
Lymphocytes Relative: 10 %
Lymphs Abs: 1.6 10*3/uL (ref 0.7–4.0)
MCH: 30.1 pg (ref 26.0–34.0)
MCHC: 34 g/dL (ref 30.0–36.0)
MCV: 88.6 fL (ref 80.0–100.0)
Monocytes Absolute: 1.3 10*3/uL — ABNORMAL HIGH (ref 0.1–1.0)
Monocytes Relative: 8 %
Neutro Abs: 13.1 10*3/uL — ABNORMAL HIGH (ref 1.7–7.7)
Neutrophils Relative %: 80 %
Platelets: 326 10*3/uL (ref 150–400)
RBC: 3.85 MIL/uL — ABNORMAL LOW (ref 3.87–5.11)
RDW: 14.1 % (ref 11.5–15.5)
WBC: 16.4 10*3/uL — ABNORMAL HIGH (ref 4.0–10.5)
nRBC: 0 % (ref 0.0–0.2)

## 2022-12-24 LAB — URINALYSIS, W/ REFLEX TO CULTURE (INFECTION SUSPECTED)
Bacteria, UA: NONE SEEN
Bilirubin Urine: NEGATIVE
Glucose, UA: NEGATIVE mg/dL
Hgb urine dipstick: NEGATIVE
Ketones, ur: NEGATIVE mg/dL
Leukocytes,Ua: NEGATIVE
Nitrite: NEGATIVE
Protein, ur: NEGATIVE mg/dL
Specific Gravity, Urine: 1.012 (ref 1.005–1.030)
pH: 8 (ref 5.0–8.0)

## 2022-12-24 LAB — HEMOGLOBIN A1C
Hgb A1c MFr Bld: 5.5 % (ref 4.8–5.6)
Mean Plasma Glucose: 111.15 mg/dL

## 2022-12-24 LAB — OLIGOCLONAL BANDS, CSF + SERM

## 2022-12-24 LAB — MISC LABCORP TEST (SEND OUT): Labcorp test code: 791584

## 2022-12-24 LAB — MAGNESIUM: Magnesium: 2.1 mg/dL (ref 1.7–2.4)

## 2022-12-24 LAB — GLUCOSE, CAPILLARY
Glucose-Capillary: 157 mg/dL — ABNORMAL HIGH (ref 70–99)
Glucose-Capillary: 145 mg/dL — ABNORMAL HIGH (ref 70–99)
Glucose-Capillary: 322 mg/dL — ABNORMAL HIGH (ref 70–99)
Glucose-Capillary: 269 mg/dL — ABNORMAL HIGH (ref 70–99)

## 2022-12-24 MED ORDER — PHENYTOIN SODIUM EXTENDED 100 MG PO CAPS
100.0000 mg | ORAL_CAPSULE | Freq: Two times a day (BID) | ORAL | Status: DC
  Administered 2022-12-24 – 2022-12-26 (×4): 100 mg via ORAL
  Filled 2022-12-24 (×5): qty 1

## 2022-12-24 MED ORDER — LACOSAMIDE 200 MG PO TABS
200.0000 mg | ORAL_TABLET | Freq: Two times a day (BID) | ORAL | Status: DC
  Administered 2022-12-24 – 2022-12-26 (×4): 200 mg via ORAL
  Filled 2022-12-24 (×4): qty 1

## 2022-12-24 MED ORDER — ONDANSETRON HCL 4 MG/2ML IJ SOLN: 4.0000 mg | Freq: Four times a day (QID) | INTRAMUSCULAR | Status: DC | PRN

## 2022-12-24 MED ORDER — ENOXAPARIN SODIUM 40 MG/0.4ML IJ SOSY
40.0000 mg | PREFILLED_SYRINGE | INTRAMUSCULAR | Status: DC
  Administered 2022-12-24 – 2022-12-25 (×2): 40 mg via SUBCUTANEOUS
  Filled 2022-12-24 (×2): qty 0.4

## 2022-12-24 MED ORDER — LEVETIRACETAM 750 MG PO TABS
2000.0000 mg | ORAL_TABLET | Freq: Two times a day (BID) | ORAL | Status: DC
  Administered 2022-12-24 – 2022-12-26 (×4): 2000 mg via ORAL
  Filled 2022-12-24 (×4): qty 1

## 2022-12-24 NOTE — Telephone Encounter (Signed)
Pharmacy Patient Advocate Encounter  Insurance verification completed.    The patient is insured through [employer] Part D   The patient is currently admitted and ran test claims for the following: lacosamide (Vimpat) .  Copays and coinsurance results were relayed to Inpatient clinical team.

## 2022-12-24 NOTE — Progress Notes (Signed)
[facility]                                                                               [employer], is a 57 y.o. female, DOB - 08-Jul-1966, ZOX:[id number] Admit date - 12/18/2022    Outpatient Primary MD for the patient is [name], [name], MD  LOS - 6  days    Brief summary   57 year old Caucasian female history of rheumatoid arthritis, giant cell arteritis on chronic prednisone, chronic pain syndrome on chronic opiates through [facility] in [facility], chronic pancreatitis, hypothyroidism, depression who presents as a transfer from [facility] where she was admitted. She was found to be in focal status epilepticus during routine EEG testing today. She was transferred to [facility] for long-term EEG.  She has been on LTM EEG , and is on Keppra, vimpat and phenytoin for seizure control.     Assessment & Plan    Assessment and Plan: * Status epilepticus (HCC) Currently on LTM eeg.  Neurology on board .   Initially Loaded with  fosphenytoin, and keppra,  followed by Keppra  2000 mg BID, Vimpat 200 mg BID added, phenytoin  75 mg TID. Phenytoin level 4.4 ( still low) Underwent FL guided LP by IR. Labs for evaluation of autoimmune process pending.   Gram stain negative. Cultures negative so far.  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 Last day of IV steroids.  Seizures improving, and further recommendations from neurology to follow.   Acute encephalopathy due to status epilepticus: She is alert  but lethargic, answering all questions appropriately.  SLP eval recommending regular diet with thin liquids.  Recommend therapy evaluation once LTM EEG is done.    Acute mild colitis:  She was started on IV rocephin and IV flagyl, completed 7 days. C DIFF antigen and toxin is negative.  Slightly nauseated this morning, added zofran.  Loose stools.    Chronic pancreatitis (HCC) Recommend  outpatient follow up with GI. Will probably need MRCP at some point.   Hypokalemia Replaced. Repeat levels wnl.   Hypothyroidism Patient is not on any thyroid supplements. TSH wnl.   Essential hypertension BP parameters are elevated this morning. Repeat BP parameters in 2 hours.    RA (rheumatoid arthritis) (HCC) Chronic., on methotrexate at home, has not had any meds in the last 2 weeks.   Chronic prescription opiate use - thru [facility] in [place], [place] Was getting oxycodone 20 mg 5 times a day.   Currently on IV morphine 2 mg PRN every 2 hours.   GCA (giant cell arteritis) (HCC) On chronic prednisone  At home. Once the IV solumedrol is completed , will restart her home dose of steroids.    Hyperglycemia is from steroids .  A1c is 5.5%   Tachycardia,  EKG shows sinus tachycardia. Resolved.    Leukocytosis:  She is afebrile, denies any urinary symptoms, no cough.  Will get pro calcitonin level.  Repeat cbc in am.      Estimated body mass index is 28.16 kg/m as calculated from the following:   Height as of an earlier  encounter on 12/18/22: 5\' 9"  (1.753 m).   Weight as of this encounter: 86.5 kg.  Code Status: full code.  DVT Prophylaxis:  SCDs Start: 12/19/22 0203   Level of Care: Level of care: Progressive Family Communication: none at bedside.   Disposition Plan:     Remains inpatient appropriate:  IV solumedrol. IV anti epileptics, on LTM EEG.   Procedures:  FL guided LP on 5/1  Consultants:   Neurology.   Antimicrobials:   Anti-infectives (From admission, onward)    Start     Dose/Rate Route Frequency Ordered Stop   12/19/22 1800  cefTRIAXone (ROCEPHIN) 2 g in sodium chloride 0.9 % 100 mL IVPB  Status:  Discontinued        2 g 200 mL/hr over 30 Minutes Intravenous Every 24 hours 12/19/22 0450 12/21/22 1135   12/19/22 1000  metroNIDAZOLE (FLAGYL) IVPB 500 mg  Status:  Discontinued        500 mg 100 mL/hr over 60 Minutes Intravenous 2 times daily  12/19/22 0450 12/21/22 1135        Medications  Scheduled Meds:  feeding supplement  237 mL Oral BID BM   insulin aspart  0-6 Units Subcutaneous TID WC   multivitamin with minerals  1 tablet Oral Daily   mouth rinse  15 mL Mouth Rinse 4 times per day   phenytoin (DILANTIN) IV  75 mg Intravenous Q8H   [START ON 12/27/2022] thiamine (VITAMIN B1) injection  100 mg Intravenous Daily   Continuous Infusions:  lacosamide (VIMPAT) IV 200 mg (12/23/22 2304)   levETIRAcetam 2,000 mg (12/24/22 0913)   methylPREDNISolone (SOLU-MEDROL) injection Stopped (12/23/22 1102)   thiamine (VITAMIN B1) injection Stopped (12/23/22 1255)   PRN Meds:.acetaminophen, midazolam, morphine injection, ondansetron (ZOFRAN) IV, mouth rinse    Subjective:   Abigail [name] was seen and examined today.  Some nausea earlier today.  Objective:   Vitals:   12/23/22 1959 12/23/22 2318 12/24/22 0351 12/24/22 0801  BP: 125/78 (!) 125/95 (!) 144/85 (!) 158/100  Pulse: (!) 111 (!) 101 97 99  Resp: 18 16 16 16   Temp: 98.7 F (37.1 C) 97.9 F (36.6 C) 97.7 F (36.5 C) 98.6 F (37 C)  TempSrc: Oral Oral Oral   SpO2: 95% 95% 96% 95%  Weight:        Intake/Output Summary (Last 24 hours) at 12/24/2022 0924 Last data filed at 12/24/2022 0552 Gross per 24 hour  Intake 862.66 ml  Output 2000 ml  Net -1137.34 ml    Filed Weights   12/19/22 0333 12/20/22 0500 12/21/22 0500  Weight: 87.5 kg 86.7 kg 86.5 kg     Exam General exam: Appears calm and comfortable  Respiratory system: Clear to auscultation. Respiratory effort normal. Cardiovascular system: S1 & S2 heard, RRR. No JVD,  Gastrointestinal system: Abdomen is nondistended, soft and nontender.  Central nervous system: Alert and oriented. Grossly non focal. Extremities: No edema.  Skin: No rashes, Psychiatry:  flat affect.          Data Reviewed:  I have personally reviewed following labs and imaging studies   CBC Lab Results  Component Value Date    WBC 16.4 (H) 12/24/2022   RBC 3.85 (L) 12/24/2022   HGB 11.6 (L) 12/24/2022   HCT 34.1 (L) 12/24/2022   MCV 88.6 12/24/2022   MCH 30.1 12/24/2022   PLT 326 12/24/2022   MCHC 34.0 12/24/2022   RDW 14.1 12/24/2022   LYMPHSABS 1.6 12/24/2022   MONOABS 1.3 (H) 12/24/2022  EOSABS 0.0 12/24/2022   BASOSABS 0.1 12/24/2022     Last metabolic panel Lab Results  Component Value Date   NA 136 12/24/2022   K 3.8 12/24/2022   CL 97 (L) 12/24/2022   CO2 31 12/24/2022   BUN <5 (L) 12/24/2022   CREATININE 0.51 12/24/2022   GLUCOSE 184 (H) 12/24/2022   GFRNONAA >60 12/24/2022   GFRAA >60 01/15/2015   CALCIUM 8.4 (L) 12/24/2022   PHOS 3.3 12/07/2022   PROT 5.6 (L) 12/24/2022   ALBUMIN 2.4 (L) 12/24/2022   BILITOT 0.3 12/24/2022   ALKPHOS 86 12/24/2022   AST 21 12/24/2022   ALT 20 12/24/2022   ANIONGAP 8 12/24/2022    CBG (last 3)  Recent Labs    12/23/22 1652 12/23/22 2127 12/24/22 0624  GLUCAP 362* 243* 157*       Coagulation Profile: No results for input(s): "INR", "PROTIME" in the last 168 hours.   Radiology Studies: No results found.     Kathlen Mody M.D. [facility] 12/24/2022, 9:24 AM  Available via Epic secure chat 7am-7pm After 7 pm, please refer to night coverage provider listed on amion.

## 2022-12-24 NOTE — Procedures (Signed)
Patient Name: [name] [facility]  MRN: [id number]  Epilepsy Attending: [name]  Referring Physician/Provider: Gordy Councilman, MD  Duration: 12/24/2022 0437 to 12/24/2022  1130   Patient history: 57 year old female past medical history of anxiety, fibromyalgia, chronic pain, IBS, colitis. Presented with acute pancreatitis, colitis, acute metabolic encephalopathy and hypokalemia. EEG to evaluate for seizure   Level of alertness: awake, asleep   AEDs during EEG study: LEV, PHT, LCM   Technical aspects: This EEG study was done with scalp electrodes positioned according to the 10-20 International system of electrode placement. Electrical activity was reviewed with band pass filter of 1-70Hz , sensitivity of 7 uV/mm, display speed of 49mm/sec with a 60Hz  notched filter applied as appropriate. EEG data were recorded continuously and digitally stored.  Video monitoring was available and reviewed as appropriate.   Description: No clear posterior dominant was being seen. Sleep was characterized by sleep symptoms (12 to 14 Hz), maximal frontocentral region. EEG showed continuous generalized polymorphic sharply contoured 3 to 6 Hz theta-delta slowing. Abundant sharp waves were noted in right occipital region. Hyperventilation and photic stimulation were not performed.      ABNORMALITY - Sharp waves, right occipital region   - Continuous slow, generalized   IMPRESSION: This study showed evidence of arising from right occipital region.  Additionally there is moderate to severe diffuse encephalopathy. No seizures were noted   Abigail [name] Abigail [name]

## 2022-12-24 NOTE — TOC Progression Note (Signed)
Transition of Care [facility]) - Progression Note    Patient Details  Name: Reina Cockman [facility] MRN: [id number] Date of Birth: 06/18/1966  Transition of Care [facility]) CM/SW Contact  Kermit Balo, RN Phone Number: 12/24/2022, 10:48 AM  Clinical Narrative:    TOC following for d/c needs. She will need therapy evals after LTEEG completed.     Expected Discharge Plan: Home/Self Care    Expected Discharge Plan and Services                                               Social Determinants of Health (SDOH) Interventions SDOH Screenings   Food Insecurity: Food Insecurity Present (12/14/2022)  Transportation Needs: No Transportation Needs (12/08/2022)  Tobacco Use: High Risk (12/19/2022)    Readmission Risk Interventions     No data to display

## 2022-12-24 NOTE — Progress Notes (Signed)
LTM maint complete - no skin breakdown under:  a2, o1, c3

## 2022-12-24 NOTE — Progress Notes (Signed)
EEG D/C'd. No skin breakdown noted. Patient had no skin break down.

## 2022-12-24 NOTE — Care Management Important Message (Signed)
Important Message  Patient Details  Name: [facility]ail [name] [facility] MRN: [id number] Date of Birth: 08-13-1966   Medicare Important Message Given:  Yes     Jance Siek Stefan Lundin 12/24/2022, 3:55 PM

## 2022-12-24 NOTE — TOC Benefit Eligibility Note (Signed)
Patient Product/process development scientist completed.    The patient is currently admitted and upon discharge could be taking lacosamide (Vimpat) 200 mg tablets.  The current 30 day co-pay is $14.25.   The patient is insured through [employer] Part D   This test claim was processed through [facility]- copay amounts may vary at other pharmacies due to pharmacy/plan contracts, or as the patient moves through the different stages of their insurance plan.  Abigail [name], CPHT Pharmacy Patient Advocate Specialist [facility] Patient Advocate Team Direct Number: [phone]  Fax: [phone]

## 2022-12-24 NOTE — Procedures (Signed)
Patient Name: Abigail [name] [facility]  MRN: [id number]  Epilepsy Attending: [name]  Referring Physician/Provider: Gordy Councilman, MD  Duration: 12/23/2022 4098 to 12/24/2022  0437   Patient history: 57 year old female past medical history of anxiety, fibromyalgia, chronic pain, IBS, colitis. Presented with acute pancreatitis, colitis, acute metabolic encephalopathy and hypokalemia. EEG to evaluate for seizure   Level of alertness: awake, asleep   AEDs during EEG study: LEV, PHT, LCM   Technical aspects: This EEG study was done with scalp electrodes positioned according to the 10-20 International system of electrode placement. Electrical activity was reviewed with band pass filter of 1-70Hz , sensitivity of 7 uV/mm, display speed of 79mm/sec with a 60Hz  notched filter applied as appropriate. EEG data were recorded continuously and digitally stored.  Video monitoring was available and reviewed as appropriate.   Description: No clear posterior dominant was being seen. Sleep was characterized by sleep symptoms (12 to 14 Hz), maximal frontocentral region. EEG showed continuous generalized polymorphic sharply contoured 3 to 6 Hz theta-delta slowing. Abundant sharp waves were noted in right occipital region.   One seizure without clinical signs was noted arising from right occipital region on 12/23/2022 at 0808. During seizure, EEG showed 10 to 11 Hz sharply contoured alpha activity admixed with spikes in right occipital region which then involved left centro- temporo- parietal region and evolved to 2 to 3 Hz delta slowing and involved left hemisphere. Duration of seizure was about 2 minutes.    Hyperventilation and photic stimulation were not performed.      ABNORMALITY - Seizure without clinical signs, right occipital region  - Sharp waves, right occipital region   - Continuous slow, generalized   IMPRESSION: This study showed one seizures without clinical signs arising from right  occipital region,  on 12/23/2022 at 0808, lasting about 2 minutes. Additionally there is moderate to severe diffuse encephalopathy.    Abigail [name] [name]

## 2022-12-24 NOTE — Progress Notes (Signed)
Subjective: No clinical seizures overnight.  Patient denies any concerns.  Requesting to go home.  Became tearful again when I told her she may need to stay 1 more day.  ROS: negative except above  Examination  Vital signs in last 24 hours: Temp:  [97.7 F (36.5 C)-98.9 F (37.2 C)] 98.6 F (37 C) (05/06 0801) Pulse Rate:  [95-119] 99 (05/06 0801) Resp:  [16-18] 16 (05/06 0801) BP: (125-158)/(78-100) 158/100 (05/06 0801) SpO2:  [90 %-96 %] 95 % (05/06 0801)  General: lying in bed, NAD Neuro: MS: Alert, oriented, follows commands CN: pupils equal and reactive,  EOMI, face symmetric, tongue midline, normal sensation over face Motor: 4/5 strength in all 4 extremities Coordination: normal Gait: not tested  Basic Metabolic Panel: Recent Labs  Lab 12/18/22 0632 12/19/22 0300 12/20/22 0000 12/20/22 1059 12/21/22 0721 12/22/22 0426 12/24/22 0345  NA 135 135 136 135 136 135 136  K 2.4* 2.8* 2.8* 3.3* 3.0* 3.9 3.8  CL 95* 98 95* 97* 94* 99 97*  CO2 28 25 27 28 29 31 31   GLUCOSE 100* 105* 91 75 140* 158* 184*  BUN <5* <5* <5* <5* <5* 7 <5*  CREATININE 0.52 0.54 0.47 0.46 0.59 0.46 0.51  CALCIUM 7.4* 7.5* 7.9* 7.6* 8.3* 8.2* 8.4*  MG 1.8 2.0 2.2  --   --   --  2.1    CBC: Recent Labs  Lab 12/18/22 0632 12/20/22 0000 12/24/22 0345  WBC 14.7* 7.8 16.4*  NEUTROABS  --  5.1 13.1*  HGB 10.6* 12.6 11.6*  HCT 31.2* 36.9 34.1*  MCV 86.9 87.4 88.6  PLT 193 150 326     Coagulation Studies: No results for input(s): "LABPROT", "INR" in the last 72 hours.  Imaging No new brain imaging.   ASSESSMENT AND PLAN: 57 year old female presented to [facility] hospital on 12/14/2022 with altered mental status.  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  Therefore she was transferred to [facility] for long-term EEG.   Focal convulsive status epilepticus. resolved Focal seizures -Unclear  etiology of focal status epilepticus.  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  -DC LTM EEG patient has remained seizure-free for 24 hours -Continue seizure precautions - As needed IV Versed for clinical seizures -Recommend follow-up with Dr. Elbert Ewings at [facility] in 4 to 6 weeks (referral placed).  Can consider repeat MRI brain with and without contrast in 2 to 3 months -Will order PT/OT -Management of rest of comorbidities per primary team -Discussed plan with Dr. Blake Divine via secure chat    I have spent a total of 38 minutes with the patient reviewing hospital notes,  test results, labs and examining the patient as well as establishing an assessment and plan that was discussed personally with the patient.  > 50% of time was spent in direct patient care.   Jaiona Simien Epilepsy [facility] For questions after 5pm please refer to AMION to reach the  Neurologist on call   Lindie Spruce Epilepsy [facility] For questions after 5pm please refer to AMION to reach the Neurologist on call

## 2022-12-24 NOTE — Plan of Care (Signed)
  Problem: Elimination: Goal: Will not experience complications related to bowel motility Outcome: Progressing Goal: Will not experience complications related to urinary retention Outcome: Progressing   Problem: Skin Integrity: Goal: Risk for impaired skin integrity will decrease Outcome: Progressing   

## 2022-12-24 NOTE — Consult Note (Signed)
[facility] Psychiatry New Face-to-Face Psychiatric Evaluation   Service Date: Dec 24, 2022 LOS:  LOS: 6 days    Assessment  Abigail [name] is a 57 y.o. 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 She carries the psychiatric diagnoses of MDD and has a past medical history of  rheumatoid arthritis, giant cell arteritis on chronic prednisone, chronic pain syndrome on chronic opiates through [facility] in [facility], chronic pancreatitis, hypothyroidism. Psychiatry was consulted for depression by primary team.    Her current presentation of tearfulness and confusion is most consistent with cognitive changes worsened by prolonged hospital stay and chronic back pain. She meets criteria for waxing and waning delirium based on intermittent sedation and mood lability noted during interview. Patient's mental status will likely improve with reintroduction of her home meds, better control of back pain, and continued work with physical therapy.   Of note patient has reported diagnosis of MDD however we suspect that acute onset of worsening cognitive and mood changs is more likely related to environmental factors and uncontrolled pain. She is not taking outpatient psychotropic medications but historically she has had a good response to SSRIs. On initial examination, patient appears confused but appears to be doing  better today. Please see plan below for detailed recommendations.   Diagnoses:  Active Hospital problems: Principal Problem:   Status epilepticus (HCC) Active Problems:   Chronic prescription opiate use - thru [facility] in [place], [place]   RA (rheumatoid arthritis) (HCC)   Essential hypertension   Hypothyroidism   Hypokalemia   Chronic pancreatitis (HCC)   GCA (giant cell arteritis) (HCC)   DNR (do not resuscitate)   Protein-calorie malnutrition, severe     Plan  ## Safety and Observation  Level:  - Based on my clinical evaluation, I estimate the patient to be at moderate risk of self harm in the current setting - At this time, we recommend a q15 check level of observation. This decision is based on my review of the chart including patient's history and current presentation, interview of the patient, mental status examination, and consideration of suicide risk including evaluating suicidal ideation, plan, intent, suicidal or self-harm behaviors, risk factors, and protective factors. This judgment is based on our ability to directly address suicide risk, implement suicide prevention strategies and develop a safety plan while the patient is in the clinical setting. Please contact our team if there is a concern that risk level has changed.   ## Medications:  --Plan to restart home Cymbalta for back pain  -- Plan to start Nicotine patch prn; endorses home tobacco use   -- Advise against restarting Flexiril and opiates, as these can be highly deleriogenic    ## Medical Decision Making Capacity:  - Patient does not appear to have decision making capacity   ## Further Work-up:  -- none  -- most recent EKG on 5/6 had QtC of 416 -- Pertinent labwork reviewed earlier this admission includes: WBC 16.4 on 5/6   ## Disposition:  -- TBD  ## Behavioral / Environmental:  -- Continue delirium precautions   ##Legal Status - no IVC in place   Thank you for this consult request. Recommendations have been communicated to the primary team.  We will *** at this time.   Clovis Cao, Medical Student, MS4   NEW history  Relevant Aspects of Hospital Course:  Admitted on 12/18/2022 for failure to thrive, found to  have acute encephalopathy w/ focal status epilepticus on EEG. Per report, poor oral intake in the setting of abdominal pain prior to admission. She was started on Keppra, Vimpat and phenytoin for seizure control. Patient is on prescription opiates for Chronic Pain Syndrome though [facility] and was started IV morphine 2 mg prn q2h. She is on chronic prednisone for GCA.   Patient Report:   Patient was initially seen around 57. She was on continuous EEG. She was very dificult to rouse. She is aaware that she is at [facility]. She is not able to say what city she is in. She then fell back asleep.   Saw again around 2:30. She was awake and tearful. She was oriented to self and location. She knows the current president. She is tearful and requesting to go home (she perseverates on this throughout interview) because she can't stand "sitting in this chair, laying in this chair, laying on my back hurts so bad". She is in a lot of pain. Feels she has been lied to - believes has been told she can go home "tomorrow" for past several days.    She denies any suicidal ideations. Denies HI, wanting to hurt others. She reports increased sleep both the hospital and prior to admission, however mom reports sleep schedule is improving.  Patient reports increase appetite. She reports a history of depression, and states that husband death months ago was an acute stressor for her.     We did a symptom inventory, but some of pt's responss were clearly untrue and mediated by recent encephalopathy (ie reported no difficulties with concnetration/attention) so this should be taken with a grain of salt.    Mom discusses day/night flipping even prior to this admission. This sounds like it has been going on for about 2 months. She endorses being depressed recently   NO real feelings of guilt or worthlessness. NO real change in energy levles. She denies difficulty concentrating.    Will reassess tomorrow. Discussed restarting some of patients home medications for pain, nicotine patching will likely improve mood symptoms and craving. Encouraged work with PT.    ROS:  Denies SI/HI/AVH Denies current of prior manic symptoms  NO real feelings of guilt or worthlessness NO real change in energy levels Denies  difficulty concentration (inaccurate due to cognitive changes)    Collateral information:  Mom reports that patient seems less confused today, sleeping better. She discusses day/night flipping even prior to this admission. This sounds like it has been going on for about 2 months. She endorses being depressed recently due to recent death of husband.   Psychiatric History:  Information collected from patient  Historical dx of MDD, was on SSRI many years ago that was helpful  No currently on any medication  Not currently seeing psychiatrist, therapist  Family psych history:  Father; depression  Social History:  Patient states that she lives with her son, 93 y/o. She has a daughter who is 49 y/o. Reports she is currently unemployed. Denies guns in the home. Reports current tobacco use. Denies Etoh or other substance use.    Family History:  {[id number]} The patient's family history is not on file.  Medical History: Past Medical History:  Diagnosis Date   Anxiety    Arthritis    Back pain    Basal cell carcinoma 10/04/2021   R axilla - needs excised 11/28/21   Basal cell carcinoma 10/04/2021   L antecubital excised 11/14/21   Diarrhea 11/12/2016  Fibromyalgia    Generalized abdominal pain 11/12/2016   H. pylori infection    Hyperlipidemia    IBS (irritable bowel syndrome)    Infectious colitis 04/29/2016   Migraines    Moderate dehydration 04/29/2016   Muscle pain    Opioid overdose (HCC) 12/07/2022   Reflux    Unexplained weight loss 11/12/2016    Surgical History: Past Surgical History:  Procedure Laterality Date   ABDOMINAL HYSTERECTOMY     APPENDECTOMY  2009   C5 FUSION     C6 FUSION     C7 FUSION     COLONOSCOPY  02/2006   COLONOSCOPY WITH PROPOFOL N/A 12/25/2016   Procedure: COLONOSCOPY WITH PROPOFOL;  Surgeon: Earline Mayotte, MD;  Location: [facility] ENDOSCOPY;  Service: Endoscopy;  Laterality: N/A;   ESOPHAGOGASTRODUODENOSCOPY (EGD) WITH PROPOFOL N/A  12/25/2016   Procedure: ESOPHAGOGASTRODUODENOSCOPY (EGD) WITH PROPOFOL;  Surgeon: Earline Mayotte, MD;  Location: [facility] ENDOSCOPY;  Service: Endoscopy;  Laterality: N/A;   FOOT SURGERY     HIP ARTHROPLASTY     L4 FUSION     L5 FUSION     S1 FUSION      Medications:   Current Facility-Administered Medications:    acetaminophen (TYLENOL) suppository 650 mg, 650 mg, Rectal, Q4H PRN, Carollee Herter, DO   enoxaparin (LOVENOX) injection 40 mg, 40 mg, Subcutaneous, Q24H, Blake Divine, Vijaya, MD, 40 mg at 12/24/22 1158   feeding supplement (ENSURE ENLIVE / ENSURE PLUS) liquid 237 mL, 237 mL, Oral, BID BM, Kathlen Mody, MD, 237 mL at 12/24/22 1336   insulin aspart (novoLOG) injection 0-6 Units, 0-6 Units, Subcutaneous, TID WC, Bhagat, Srishti L, MD, 4 Units at 12/24/22 1702   lacosamide (VIMPAT) tablet 200 mg, 200 mg, Oral, BID, Melynda Ripple, Kristopher Oppenheim, MD   levETIRAcetam (KEPPRA) tablet 2,000 mg, 2,000 mg, Oral, BID, Melynda Ripple, Priyanka O, MD   midazolam (VERSED) injection 2 mg, 2 mg, Intravenous, Q4H PRN, Bhagat, Srishti L, MD   morphine (PF) 2 MG/ML injection 2 mg, 2 mg, Intravenous, Q2H PRN, Carollee Herter, DO, 2 mg at 12/24/22 1323   multivitamin with minerals tablet 1 tablet, 1 tablet, Oral, Daily, Kathlen Mody, MD, 1 tablet at 12/24/22 0921   ondansetron (ZOFRAN) injection 4 mg, 4 mg, Intravenous, Q6H PRN, Kathlen Mody, MD   Oral care mouth rinse, 15 mL, Mouth Rinse, 4 times per day, Kathlen Mody, MD, 15 mL at 12/24/22 1227   Oral care mouth rinse, 15 mL, Mouth Rinse, PRN, Kathlen Mody, MD   phenytoin (DILANTIN) ER capsule 100 mg, 100 mg, Oral, BID, [name], MD, 100 mg at 12/24/22 1703   [COMPLETED] thiamine (VITAMIN B1) 500 mg in sodium chloride 0.9 % 50 mL IVPB, 500 mg, Intravenous, Q8H, Last Rate: 100 mL/hr at 12/20/22 2128, 500 mg at 12/20/22 2128 **FOLLOWED BY** thiamine (VITAMIN B1) 250 mg in sodium chloride 0.9 % 50 mL IVPB, 250 mg, Intravenous, Daily, Last Rate: 100 mL/hr at 12/24/22 1226, 250  mg at 12/24/22 1226 **FOLLOWED BY** [START ON 12/27/2022] thiamine (VITAMIN B1) injection 100 mg, 100 mg, Intravenous, Daily, Bhagat, Srishti L, MD  Allergies: Allergies  Allergen Reactions   Nsaids Hives   Tapentadol Swelling, Rash and Other (See Comments)    Nucynta- Made her deathly sick   Cephalexin Other (See Comments)    Reaction not cited   Codeine Other (See Comments)    Reaction not cited   Darvocet [Propoxyphene N-Acetaminophen] Other (See Comments)    Reaction not cited   Latex Other (  See Comments)    Reaction not cited   Silicone Other (See Comments)    Reaction not cited   Sulfa Antibiotics Hives   Tape Other (See Comments)    Reaction not cited   Meloxicam Rash       Objective  Vital signs:  Temp:  [97.6 F (36.4 C)-98.7 F (37.1 C)] 98 F (36.7 C) (05/06 1523) Pulse Rate:  [97-111] 107 (05/06 1523) Resp:  [16-18] 16 (05/06 1523) BP: (125-158)/(66-100) 131/66 (05/06 1523) SpO2:  [92 %-98 %] 92 % (05/06 1523)  Psychiatric Specialty Exam:  Presentation  General Appearance: Disheveled  Eye Contact:Fleeting (oftens crys and looks away)  Speech:Garbled (breaks from conversation to cry)  Speech Volume:Normal  Handedness:No data recorded  Mood and Affect  Mood:Irritable; Labile  Affect:Tearful   Thought Process  Thought Processes:Linear  Descriptions of Associations:Loose  Orientation:Full (Time, Place and Person)  Thought Content:Rumination (repeats says that she wants to go home)  History of Schizophrenia/Schizoaffective disorder:No data recorded Duration of Psychotic Symptoms:No data recorded Hallucinations:Hallucinations: None  Ideas of Reference:None  Suicidal Thoughts:Suicidal Thoughts: No  Homicidal Thoughts:Homicidal Thoughts: No   Sensorium  Memory:Recent Fair; Immediate Poor  Judgment:Poor  Insight:Poor   Executive Functions  Concentration:Poor  Attention Span:Poor  Recall:Fair  Fund of  Knowledge:Fair  Language:Fair   Psychomotor Activity  Psychomotor Activity:Psychomotor Activity: Normal   Assets  Assets:Social Support; Housing   Sleep  Sleep:Sleep: Fair    Physical Exam: Physical Exam Review of Systems  Constitutional: Negative.   Musculoskeletal:  Positive for back pain.  Psychiatric/Behavioral:  Negative for depression, hallucinations, substance abuse and suicidal ideas. The patient is nervous/anxious. The patient does not have insomnia.    Blood pressure 131/66, pulse (!) 107, temperature 98 F (36.7 C), temperature source Oral, resp. rate 16, weight 86.5 kg, SpO2 92 %. Body mass index is 28.16 kg/m.

## 2022-12-24 NOTE — Progress Notes (Signed)
Phenytoin Initial Consult Indication: Focal convulsive status epilepticus   Allergies  Allergen Reactions   Nsaids Hives   Tapentadol Swelling, Rash and Other (See Comments)    Nucynta- Made her deathly sick   Cephalexin Other (See Comments)    Reaction not cited   Codeine Other (See Comments)    Reaction not cited   Darvocet [Propoxyphene N-Acetaminophen] Other (See Comments)    Reaction not cited   Latex Other (See Comments)    Reaction not cited   Silicone Other (See Comments)    Reaction not cited   Sulfa Antibiotics Hives   Tape Other (See Comments)    Reaction not cited   Meloxicam Rash    Patient Measurements: Weight: 86.5 kg (190 lb 11.2 oz)   Body mass index is 28.16 kg/m.   Vital signs: Temp: 97.6 F (36.4 C) (05/06 1120) Temp Source: Oral (05/06 0351) BP: 134/73 (05/06 1120) Pulse Rate: 98 (05/06 1120)  Labs: Lab Results  Component Value Date/Time   Albumin 2.4 (L) 12/24/2022 0345   Lab Results  Component Value Date   PHENYTOIN 4.4 (L) 12/22/2022   Estimated Creatinine Clearance: 91 mL/min (by C-G formula based on SCr of 0.51 mg/dL).    Assessment: Started phenytoin IV 50mg  Q8hr 5/1-5/3 with no load. 5/2 PHT 4.2, corrected = 9.13 and dose increased to IV 75mg  q8hr 5/3- 5/6. Another level 5/4 PHT 4.4, corrected 9.57 but not at steady state after dose increase. Patient ready to switch to PO phenytoin 5/6.  Corrected phenytoin level: 5/4 9.57 (not steady state)  Seizure activity: last seizure on EEG 5/5 at 8:08. 5/6 EEG with evidence of arising from right occipital region but no seizure.  Significant potential drug interactions: none  Goals of care:  Total phenytoin level: 10-20 mcg/ml Free phenytoin level: 1-2 mcg/ml  Plan:   Switch to PO phenytoin ER 100 BID Next phenytoin trough and albumin level 5/9 AM Pharmacy will continue to follow regarding obtaining total phenytoin levels and dose adjustments as indicated.   Alphia Moh, PharmD, BCPS,  BCCP Clinical Pharmacist  Please check AMION for all [facility] phone numbers After 10:00 PM, call Main Pharmacy [phone]

## 2022-12-25 DIAGNOSIS — I10 Essential (primary) hypertension: Secondary | ICD-10-CM | POA: Diagnosis not present

## 2022-12-25 DIAGNOSIS — K861 Other chronic pancreatitis: Secondary | ICD-10-CM | POA: Diagnosis not present

## 2022-12-25 DIAGNOSIS — G40901 Epilepsy, unspecified, not intractable, with status epilepticus: Secondary | ICD-10-CM | POA: Diagnosis not present

## 2022-12-25 DIAGNOSIS — M069 Rheumatoid arthritis, unspecified: Secondary | ICD-10-CM | POA: Diagnosis not present

## 2022-12-25 LAB — GLUCOSE, CAPILLARY
Glucose-Capillary: 131 mg/dL — ABNORMAL HIGH (ref 70–99)
Glucose-Capillary: 206 mg/dL — ABNORMAL HIGH (ref 70–99)
Glucose-Capillary: 139 mg/dL — ABNORMAL HIGH (ref 70–99)
Glucose-Capillary: 176 mg/dL — ABNORMAL HIGH (ref 70–99)

## 2022-12-25 LAB — CBC WITH DIFFERENTIAL/PLATELET
Abs Immature Granulocytes: 0.33 10*3/uL — ABNORMAL HIGH (ref 0.00–0.07)
Basophils Absolute: 0.1 10*3/uL (ref 0.0–0.1)
Basophils Relative: 0 %
Eosinophils Absolute: 0 10*3/uL (ref 0.0–0.5)
Eosinophils Relative: 0 %
HCT: 34.5 % — ABNORMAL LOW (ref 36.0–46.0)
Hemoglobin: 11.3 g/dL — ABNORMAL LOW (ref 12.0–15.0)
Immature Granulocytes: 2 %
Lymphocytes Relative: 17 %
Lymphs Abs: 2.7 10*3/uL (ref 0.7–4.0)
MCH: 29.4 pg (ref 26.0–34.0)
MCHC: 32.8 g/dL (ref 30.0–36.0)
MCV: 89.6 fL (ref 80.0–100.0)
Monocytes Absolute: 2.3 10*3/uL — ABNORMAL HIGH (ref 0.1–1.0)
Monocytes Relative: 15 %
Neutro Abs: 10.1 10*3/uL — ABNORMAL HIGH (ref 1.7–7.7)
Neutrophils Relative %: 66 %
Platelets: 347 10*3/uL (ref 150–400)
RBC: 3.85 MIL/uL — ABNORMAL LOW (ref 3.87–5.11)
RDW: 14.4 % (ref 11.5–15.5)
WBC: 15.4 10*3/uL — ABNORMAL HIGH (ref 4.0–10.5)
nRBC: 0 % (ref 0.0–0.2)

## 2022-12-25 LAB — PROCALCITONIN: Procalcitonin: <0.1 ng/mL

## 2022-12-25 LAB — CULTURE, BLOOD (ROUTINE X 2): Special Requests: ADEQUATE

## 2022-12-25 MED ORDER — THIAMINE HCL 100 MG PO TABS: 100.0000 mg | ORAL_TABLET | Freq: Every day | ORAL | 2 refills | Status: DC

## 2022-12-25 MED ORDER — NICOTINE 21 MG/24HR TD PT24
21.0000 mg | MEDICATED_PATCH | Freq: Every day | TRANSDERMAL | Status: DC
  Administered 2022-12-25 – 2022-12-26 (×2): 21 mg via TRANSDERMAL
  Filled 2022-12-25 (×2): qty 1

## 2022-12-25 MED ORDER — ENSURE ENLIVE PO LIQD: 237.0000 mL | Freq: Two times a day (BID) | ORAL | 12 refills | Status: DC

## 2022-12-25 MED ORDER — LEVETIRACETAM 1000 MG PO TABS: 2000.0000 mg | ORAL_TABLET | Freq: Two times a day (BID) | ORAL | 2 refills | Status: DC

## 2022-12-25 MED ORDER — PHENYTOIN SODIUM EXTENDED 100 MG PO CAPS: 100.0000 mg | ORAL_CAPSULE | Freq: Two times a day (BID) | ORAL | 2 refills | Status: DC

## 2022-12-25 MED ORDER — HYDROXYZINE HCL 25 MG PO TABS
25.0000 mg | ORAL_TABLET | Freq: Three times a day (TID) | ORAL | Status: DC | PRN
  Administered 2022-12-25 – 2022-12-26 (×2): 25 mg via ORAL
  Filled 2022-12-25 (×2): qty 1

## 2022-12-25 MED ORDER — DULOXETINE HCL 30 MG PO CPEP: 30.0000 mg | ORAL_CAPSULE | Freq: Every day | ORAL | 3 refills | Status: DC

## 2022-12-25 MED ORDER — DULOXETINE HCL 30 MG PO CPEP
30.0000 mg | ORAL_CAPSULE | Freq: Every day | ORAL | Status: DC
  Administered 2022-12-25 – 2022-12-26 (×2): 30 mg via ORAL
  Filled 2022-12-25 (×2): qty 1

## 2022-12-25 MED ORDER — HYDROXYZINE HCL 25 MG PO TABS: 25.0000 mg | ORAL_TABLET | Freq: Three times a day (TID) | ORAL | 0 refills | Status: DC | PRN

## 2022-12-25 MED ORDER — LACOSAMIDE 200 MG PO TABS: 200.0000 mg | ORAL_TABLET | Freq: Two times a day (BID) | ORAL | 2 refills | Status: DC

## 2022-12-25 MED ORDER — ADULT MULTIVITAMIN W/MINERALS CH: 1.0000 | ORAL_TABLET | Freq: Every day | ORAL | Status: DC

## 2022-12-25 NOTE — Progress Notes (Signed)
Speech Language Pathology Treatment: Dysphagia  Patient Details Name: Abigail [name] [facility] MRN: [id number] DOB: Jul 16, 1966 Today's Date: 12/25/2022 Time: 0920-0930 SLP Time Calculation (min) (ACUTE ONLY): 10 min  Assessment / Plan / Recommendation Clinical Impression  Pt seen for diet tolerance. Pt tearful, at times, but able to be redirected. Pt demonstrated an adequate oral swallow, pharyngeal swallow appeared [facility]. Reduced PO intake note, pt may benefit from RD consult if not already following. Recommend continuation of a regular diet with thin liquids with safe swallowing strategies/aspiration precautions as outlined below. SLP to sign off as pt has no acute SLP needs at this time.    HPI HPI: 57 year old Caucasian female history of rheumatoid arthritis, giant cell arteritis on chronic prednisone, chronic pain syndrome on chronic opiates through [facility] in [facility], chronic pancreatitis, hypothyroidism, depression who presents as a transfer from [facility] where she was admitted.  She was found to be in focal status epilepticus during routine EEG testing today.  She was transferred to [facility] for long-term EEG.  Patient unable to give history or review of systems.  No family is available.  History obtained for the patient's chart.     She was admitted on 12/14/2022 at [facility].  Her chief complaint was failure to thrive.  Reportedly by EMS she had not been eating or drinking.  12/18/22 MRI revealed Relatively symmetric bilateral parieto-occipital signal abnormality  with regions of true diffusion restriction. Findings could be seen  in the setting of PRES or severe hypoglycemic metabolic  encephalopathy.  She was encephalopathic on admission; ST consulted to assess swallow function.      SLP Plan  All goals met      Recommendations for follow up therapy are one component of a multi-disciplinary discharge planning process, led by the  attending physician.  Recommendations may be updated based on patient status, additional functional criteria and insurance authorization.    Recommendations  Diet recommendations: Regular;Thin liquid Medication Administration: Crushed with puree (as tolerated) Supervision: Patient able to self feed (set up PRN) Compensations: Slow rate;Small sips/bites Postural Changes and/or Swallow Maneuvers: Out of bed for meals;Seated upright 90 degrees;Upright 30-60 min after meal                 (RD consult if not already following) Oral care BID     Dysphagia, unspecified (R13.10)     All goals met    Clyde Canterbury, M.S., CCC-SLP Speech-Language Pathologist Secure Chat Preferred  O: [phone]  [name]  12/25/2022, 9:59 AM

## 2022-12-25 NOTE — Discharge Summary (Signed)
Physician Discharge Summary   Patient: Abigail [name] MRN: [id number] DOB: 1966/03/09  Admit date:     12/18/2022  Discharge date: 12/26/22  Discharge Physician: Kathlen Mody   PCP: Alease Medina, MD   Recommendations at discharge:  Please follow up with PCP in one week.  Please follow up with Neurology in 4 weeks.  Please follow up with psychiatry as needed.  Patient will need MRCP as outpatient for chronic pancreatitis.   Discharge Diagnoses: Principal Problem:   Status epilepticus (HCC) Active Problems:   Chronic prescription opiate use - thru [facility] in [place], [place]   RA (rheumatoid arthritis) (HCC)   Essential hypertension   Hypothyroidism   Hypokalemia   Chronic pancreatitis (HCC)   GCA (giant cell arteritis) (HCC)   DNR (do not resuscitate)   Protein-calorie malnutrition, severe   Hospital Course:  57 year old Caucasian female history of rheumatoid arthritis, giant cell arteritis on chronic prednisone, chronic pain syndrome on chronic opiates through [facility] in [facility], chronic pancreatitis, hypothyroidism, depression who presents as a transfer from [facility] where she was admitted. She was found to be in focal status epilepticus during routine EEG testing today. She was transferred to [facility] for long-term EEG.  She was started on  LTM EEG , and is on Keppra, vimpat and phenytoin for seizure control.  She has been seizure free in the last 24 hours and cleared for discharge. Patient 's family requesting to be discharged in the morning.    Assessment and Plan:   Status epilepticus (HCC) Completed LTM EEG .  Initially Loaded with  fosphenytoin, and keppra,  followed by Keppra  2000 mg BID, Vimpat 200 mg BID added, phenytoin  75 mg TID.  Underwent FL guided LP by IR. Labs for evaluation of autoimmune process pending.   Gram stain negative. Cultures negative so far.  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  No seizures in the last 24 hours.     Acute encephalopathy due to status epilepticus: Resolved.  SLP eval recommending regular diet with thin liquids.  Therapy evaluations done . Patient is 2 people assist, but she is adamant about going home.      Acute mild colitis:  She was started on IV rocephin and IV flagyl, completed 7 days. C DIFF antigen and toxin is negative.  Resolved.      Chronic pancreatitis (HCC) Recommend outpatient follow up with GI. Will probably need MRCP at some point.    Hypokalemia Replaced. Repeat levels wnl.    Hypothyroidism Patient is not on any thyroid supplements. TSH wnl.    Essential hypertension BP parameters are well controlled.      RA (rheumatoid arthritis) (HCC) Chronic., on methotrexate at home, has not had any meds in the last 2 weeks. Recommended to follow up with rheumatologist as outpatient.    Chronic prescription opiate use - thru [facility] in [place], [place] She was on high dose pain meds, at home which were held on admission.  She has been off meds for the last 2 weeks. Continue to hold them on discharge.    GCA (giant cell arteritis) (HCC) On chronic prednisone at home, unclear what dose of prednisone she is on at home.  She received 5 days of IV solumedrol. Recommend follow up with rheumatology in 1 to 2 weeks and dose prednisone.      Hyperglycemia is from steroids .  A1c  is 5.5%     Tachycardia,  EKG shows sinus tachycardia. Resolved.      Leukocytosis:  She is afebrile, denies any urinary symptoms, no cough.  Pro calcitonin level is negative.  Recommend checking cbc in am.          Estimated body mass index is 28.16 kg/m as calculated from the following:   Height as of an earlier encounter on 12/18/22: 5\' 9"  (1.753 m).   Weight as of this encounter: 86.5 kg.    Consultants: neurology.  Procedures performed: LTM eeg  Disposition: Home Diet  recommendation:  Regular diet DISCHARGE MEDICATION: Allergies as of 12/25/2022       Reactions   Nsaids Hives   Tapentadol Swelling, Rash, Other (See Comments)   Nucynta- Made her deathly sick   Cephalexin Other (See Comments)   Reaction not cited   Codeine Other (See Comments)   Reaction not cited   Darvocet [propoxyphene N-acetaminophen] Other (See Comments)   Reaction not cited   Latex Other (See Comments)   Reaction not cited   Silicone Other (See Comments)   Reaction not cited   Sulfa Antibiotics Hives   Tape Other (See Comments)   Reaction not cited   Meloxicam Rash        Medication List     STOP taking these medications    cefTRIAXone 2 g in sodium chloride 0.9 % 100 mL   cyclobenzaprine 10 MG tablet Commonly known as: FLEXERIL   dextrose 5% lactated ringers with KCl 20 mEq/L 20 MEQ/L infusion   DULoxetine 60 MG capsule Commonly known as: CYMBALTA   enoxaparin 40 MG/0.4ML injection Commonly known as: LOVENOX   gabapentin 300 MG capsule Commonly known as: NEURONTIN   HYDROmorphone 1 MG/ML injection Commonly known as: DILAUDID   hydrOXYzine 25 MG tablet Commonly known as: ATARAX   levETIRAcetam 1000 MG/100ML Soln Commonly known as: KEPPRA   LORazepam 2 MG/ML injection Commonly known as: ATIVAN   metroNIDAZOLE 500 MG/100ML Commonly known as: FLAGYL   Oxycodone HCl 20 MG Tabs   potassium chloride 10 MEQ tablet Commonly known as: KLOR-CON   predniSONE 10 MG tablet Commonly known as: DELTASONE   sodium chloride 0.9 % SOLN 50 mL with thiamine 100 MG/ML SOLN 500 mg   tiZANidine 4 MG tablet Commonly known as: ZANAFLEX       TAKE these medications    acetaminophen 650 MG suppository Commonly known as: TYLENOL Place 1 suppository (650 mg total) rectally every 6 (six) hours as needed for fever.   albuterol 108 (90 Base) MCG/ACT inhaler Commonly known as: VENTOLIN HFA Inhale 2 puffs into the lungs every 6 (six) hours as needed for  wheezing or shortness of breath.   BC Fast Pain Relief 845-65 MG Pack Generic drug: Aspirin-Caffeine Take 1 packet by mouth 2 (two) times daily as needed (for headaches or pain).   feeding supplement Liqd Take 237 mLs by mouth 2 (two) times daily between meals. Start taking on: Dec 26, 2022   folic acid 1 mg in sodium chloride 0.9 % 50 mL Inject 1 mg into the vein daily.   lacosamide 200 MG Tabs tablet Commonly known as: VIMPAT Take 1 tablet (200 mg total) by mouth 2 (two) times daily.   levETIRAcetam 1000 MG tablet Commonly known as: KEPPRA Take 2 tablets (2,000 mg total) by mouth 2 (two) times daily.   multivitamin with minerals Tabs tablet Take 1 tablet by mouth daily. Start taking on: Dec 26, 2022  NICODERM CQ TD Place 1 patch onto the skin daily as needed (for smoking cessation- while hospitalized).   ondansetron 4 MG/2ML Soln injection Commonly known as: ZOFRAN Inject 2 mLs (4 mg total) into the vein every 6 (six) hours as needed for nausea.   pantoprazole 40 MG tablet Commonly known as: PROTONIX Take 40 mg by mouth 2 (two) times daily before a meal. What changed: Another medication with the same name was removed. Continue taking this medication, and follow the directions you see here.   phenytoin 100 MG ER capsule Commonly known as: DILANTIN Take 1 capsule (100 mg total) by mouth 2 (two) times daily.   thiamine 100 MG tablet Commonly known as: VITAMIN B1 Take 1 tablet (100 mg total) by mouth daily.        Discharge Exam: Filed Weights   12/20/22 0500 12/21/22 0500 12/25/22 0500  Weight: 86.7 kg 86.5 kg 85.7 kg   General exam: Appears calm and comfortable  Respiratory system: Clear to auscultation. Respiratory effort normal. Cardiovascular system: S1 & S2 heard, RRR. No JVD,  Gastrointestinal system: Abdomen is nondistended, soft and nontender.  Central nervous system: Alert and oriented. No focal neurological deficits. Extremities: Symmetric 5 x 5  power. Skin: No rashes,  Psychiatry:  Mood & affect appropriate.    Condition at discharge: fair  The results of significant diagnostics from this hospitalization (including imaging, microbiology, ancillary and laboratory) are listed below for reference.   Imaging Studies: DG CHEST PORT 1 VIEW  Result Date: 12/24/2022 CLINICAL DATA:  Cough. EXAM: PORTABLE CHEST 1 VIEW COMPARISON:  Chest radiograph 11/24/2022, lung bases from 11/24/2022 abdominal CT. FINDINGS: Low lung volumes limit assessment. The heart is normal in size. Stable mediastinal contours. Aortic tortuosity. Mild bibasilar atelectasis. No pulmonary edema, large pleural effusion, or pneumothorax. IMPRESSION: Low lung volumes with mild bibasilar atelectasis. Electronically Signed   By: Narda Rutherford M.D.   On: 12/24/2022 15:18   DG FL GUIDED LUMBAR PUNCTURE  Result Date: 12/19/2022 CLINICAL DATA:  Altered mental status and seizure activity. Request for diagnostic lumbar puncture. EXAM: DIAGNOSTIC LUMBAR PUNCTURE UNDER FLUOROSCOPIC GUIDANCE COMPARISON:  None Available. FLUOROSCOPY: Radiation Exposure Index (as provided by the fluoroscopic device): 1.2 mGy Kerma PROCEDURE: Informed consent was obtained from the patient's mother prior to the procedure, including potential complications of headache, allergy, and pain. With the patient prone, the lower back was prepped with Betadine. 1% Lidocaine was used for local anesthesia. Lumbar puncture was performed at the L2-L3 level using a 20 gauge needle with return of clear, colorless CSF with an opening pressure of 16 cm water. Approximately 10 ml of CSF were obtained for laboratory studies. The patient tolerated the procedure well and there were no apparent complications. IMPRESSION: Technically successful image guided lumbar puncture from the L2-L3 level as above. Procedure performed by Brayton El PA-C supervised by Dr. Gennette Pac Electronically Signed   By: Marin Roberts M.D.   On:  12/19/2022 14:47   Overnight EEG with video  Result Date: 12/19/2022 [name], MD     12/22/2022  8:17 AM Patient Name: Jhoanna Helminski [facility] MRN: [id number] Epilepsy Attending: [name] Referring Physician/Provider: Gordy Councilman, MD Duration: 12/19/2022 0027 to 12/20/2022 0027  Patient history: 57 year old female past medical history of anxiety, fibromyalgia, chronic pain, IBS, colitis. Presented with acute pancreatitis, colitis, acute metabolic encephalopathy and hypokalemia. EEG to evaluate for seizure  Level of alertness: awake, asleep  AEDs during EEG study: LEV, PHT  Technical aspects:  This EEG study was done with scalp electrodes positioned according to the 10-20 International system of electrode placement. Electrical activity was reviewed with band pass filter of 1-70Hz , sensitivity of 7 uV/mm, display speed of 96mm/sec with a 60Hz  notched filter applied as appropriate. EEG data were recorded continuously and digitally stored.  Video monitoring was available and reviewed as appropriate.  Description: No clear posterior dominant was being seen. Sleep was characterized by sleep symptoms (12 to 14 Hz), maximal frontocentral region. EEG showed continuous generalized polymorphic sharply contoured 3 to 6 Hz theta-delta slowing. Bilateral independent epileptiform discharges with overriding fast activity were noted in left and right occipital region at 0.5-1hz . Seizures without clinical signs were noted arising from left occipital region.  During seizure, EEG showed 10 to 11 Hz sharply contoured alpha activity admixed with spikes in left occipital region which then involved left centro- temporo- parietal region and evolved to 2 to 3 Hz delta slowing. 31 seizures were noted, lasting about 30 seconds to 1 minute each. Seizures without clinical signs were noted arising from right occipital region.  During seizure, EEG showed 10 to 11 Hz sharply contoured alpha activity admixed with spikes in right  occipital region which then involved left centro- temporo- parietal region followed by left parieto-occipital region and evolved to 2 to 3 Hz delta slowing . 8 seizures were noted, lasting about 30 seconds to 1 minute each. Hyperventilation and photic stimulation were not performed.    ABNORMALITY -Seizure without clinical signs,  left occipital region -Bilateral independent periodic discharges with overriding fast activity, left and right occipital region  - Continuous slow, generalized  IMPRESSION: This study showed 31 seizures without clinical signs arising from left occipital region and 8 seizures without clinical signs arising from right occipital region, lasting for 30 seconds to 1 minute each.  Additionally there is evidence of independent epileptogenicity with increased risk of seizures in left and right occipital region.  Lastly there is moderate to severe diffuse encephalopathy.  [name]  EEG adult  Result Date: 12/18/2022 [name], MD     12/18/2022  5:27 PM Patient Name: Carlyann Permenter [facility] MRN: [id number] Epilepsy Attending: [name] Referring Physician/Provider: Alford Highland, MD Date: 12/18/2022 Duration: 26.12 mins Patient history: 57 year old female past medical history of anxiety, fibromyalgia, chronic pain, IBS, colitis. Presented with acute pancreatitis, colitis, acute metabolic encephalopathy and hypokalemia. EEG to evaluate for seizure Level of alertness:  comatose AEDs during EEG study: None Technical aspects: This EEG study was done with scalp electrodes positioned according to the 10-20 International system of electrode placement. Electrical activity was reviewed with band pass filter of 1-70Hz , sensitivity of 7 uV/mm, display speed of 54mm/sec with a 60Hz  notched filter applied as appropriate. EEG data were recorded continuously and digitally stored.  Video monitoring was available and reviewed as appropriate. Description: EEG showed continuous  generalized polymorphic sharply contoured 3 to 6 Hz theta-delta slowing. Bilateral independent epileptiform discharges were noted in left and right occipital region with fluctuating frequency of 1-2hz  , at times rhythmic with evolution in morphology and frequency. Per EEG etch patient was confused and had left eye jerking. 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  Hyperventilation and photic stimulation were not performed.   ABNORMALITY - Focal motor status epilepticus,  left and right occipital region. 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 Clinically patient was  confused and had left eye jerking. Additionally there was severe diffuse encephalopathy. Dr. Otelia Limes and Dr Hilton Sinclair were notified. [name]   MR BRAIN WO CONTRAST  Result Date: 12/18/2022 CLINICAL DATA:  Altered mental status EXAM: MRI HEAD WITHOUT CONTRAST TECHNIQUE: Multiplanar, multiecho pulse sequences of the brain and surrounding structures were obtained without intravenous contrast. COMPARISON:  CT Head 12/14/22 FINDINGS: Brain: There is hyperintense signal on diffusion-weighted imaging in the bilateral parieto-occipital region with corresponding signal abnormality on the ADC map. There is also T2/FLAIR hyperintense signal abnormalities region. No hemorrhage. No extra-axial fluid collection. No hydrocephalus. Vascular: Normal flow voids. Skull and upper cervical spine: Normal marrow signal. Sinuses/Orbits: No middle ear or mastoid effusion. Mucosal thickening left maxillary sinus. Orbits are unremarkable. Other: None. IMPRESSION: Relatively symmetric bilateral parieto-occipital signal abnormality with regions of true diffusion restriction. Findings could be seen in the setting of PRES or severe hypoglycemic metabolic encephalopathy. Electronically Signed   By: Lorenza Cambridge M.D.    On: 12/18/2022 12:42   CT ABDOMEN PELVIS W CONTRAST  Result Date: 12/14/2022 CLINICAL DATA:  Failure to thrive.  Pain. EXAM: CT ABDOMEN AND PELVIS WITH CONTRAST TECHNIQUE: Multidetector CT imaging of the abdomen and pelvis was performed using the standard protocol following bolus administration of intravenous contrast. RADIATION DOSE REDUCTION: This exam was performed according to the departmental dose-optimization program which includes automated exposure control, adjustment of the mA and/or kV according to patient size and/or use of iterative reconstruction technique. CONTRAST:  OMNIPAQUE IOHEXOL 300 MG/ML  SOLN COMPARISON:  CT 05/24/2014.  X-ray 12/14/2022 FINDINGS: Lower chest: Mild linear opacity lung bases likely scar or atelectasis. Trace pleural fluid. Significant breathing motion at the lung bases. Hepatobiliary: Diffuse fatty liver infiltration. Areas of sparing along the gallbladder fossa. Patent portal vein. Gallbladder is nondilated. Pancreas: There is peripancreatic fat stranding. Several cystic areas are identified involving the pancreas itself in the surrounding tissues. These include of the pancreatic head involving the pancreatic groove for example on series 2, image 34 measuring 2.5 by 1.9 cm. Focus more superior on series 2, image 29 measures 19 by 15 mm. Several smaller foci in the midbody of the pancreas on image 27 of series 2, image 24 of series 2. There is also some loculated fluid collections abutting the tail of the pancreas of the greater curve of the stomach with the adjacent wall thickening of the stomach. Example on series 2, image 19 measures 3.3 x 1.9 cm. Based on overall appearance these could be the sequela of pancreatitis with pseudocyst formation. Please correlate with specific clinical history. Spleen: Normal in size without focal abnormality. Small splenules are noted. Adrenals/Urinary Tract: The adrenal glands are unremarkable. Kidneys are without enhancing mass or  collecting system dilatation. The ureters have normal course and caliber down to the bladder which has a preserved contour. Stomach/Bowel: Large bowel is nondilated but has diffuse wall thickening greatest involving the ascending colon. Please correlate for colitis. Surgical changes along the base of the cecum in the appendix is not seen. The stomach is mildly distended with fluid. There is wall thickening along the second portion of the duodenum. There are some mildly distended proximal jejunal loops with mild wall thickening. Ileum is decompressed. Vascular/Lymphatic: Normal caliber aorta and IVC with scattered vascular calcifications. Calcifications are also seen along the branch vessels. No specific abnormal lymph node enlargement identified in the pelvis. There are some enlarged nodes in the porta hepatis. Example on series 2, image 27 measuring 2.1 x 1.2  cm. Focus portacaval series 2, image 24 measures 19 by 11 mm. Other porta hepatic nodes as well such as image 24 of series 2. Anterior to the portal vein. Reproductive: Uterus and bilateral adnexa are unremarkable. Other: Anasarca. Mesenteric stranding. No free air. No frank ascites. Musculoskeletal: Curvature of the spine. Moderate degenerative changes of the spine and pelvis. There is significant streak artifact related to the right hip arthroplasty in the lower lumbar spine fixation hardware. There is moderate compression as well of the L1 level which is new from 2015 but has sclerotic margins. This could be more chronic there is also significant compression along the lower thoracic spine at T9, T8 and T7. Based on appearance this could be more subacute but age-indeterminate. Please correlate for any known history or dedicated evaluation when appropriate IMPRESSION: Significant stranding along the pancreas with several loculated fluid collections identified involving the pancreas well as adjacent tissues. This includes the pancreatic groove as well as tail  of the pancreas of the against the greater curve of the stomach. Based on the overall appearance this could be related to evolution of the pancreatitis and pseudocyst formation. Please correlate with specific clinical presentation. Wall thickening along the colon diffusely greatest along the right side with some stranding. Please correlate for colitis is also some mild distention with some fold thickening along loops of jejunum. Fatty liver infiltration. Several enlarged lymph nodes in the porta hepatis right upper quadrant. This has a broad differential with the other findings and recommend attention on short follow-up Trace pleural fluid. Multilevel compression deformities along the spine. Those involving the mid to lower thoracic spine may be more subacute but are age-indeterminate and recommend further evaluation if there is no known history. Electronically Signed   By: Karen Kays M.D.   On: 12/14/2022 16:27   CT HEAD WO CONTRAST ( )  Result Date: 12/14/2022 CLINICAL DATA:  Mental status change, cause.  Failure to thrive. EXAM: CT HEAD WITHOUT CONTRAST TECHNIQUE: Contiguous axial images were obtained from the base of the skull through the vertex without intravenous contrast. RADIATION DOSE REDUCTION: This exam was performed according to the departmental dose-optimization program which includes automated exposure control, adjustment of the mA and/or kV according to patient size and/or use of iterative reconstruction technique. COMPARISON:  CT head without contrast 4/19/4. FINDINGS: Brain: No acute infarct, hemorrhage, or mass lesion is present. Mild white matter changes in the anterior limb of the internal capsule bilaterally are stable. Basal ganglia are intact. Insular ribbon is bilaterally. The ventricles are of normal size. No significant extraaxial fluid collection is present. The brainstem and cerebellum are within normal limits. Midline structures are within normal limits. Vascular: No hyperdense  vessel or unexpected calcification. Skull: Calvarium is intact. No focal lytic or blastic lesions are present. No significant extracranial soft tissue lesion is present. Sinuses/Orbits: Chronic left maxillary sinus opacification is present. The sinus is shrunken. The paranasal sinuses and mastoid air cells are otherwise clear. The globes and orbits are within normal limits. IMPRESSION: 1. No acute intracranial abnormality. 2. Mild white matter changes are stable. 3. Chronic left maxillary sinus disease. Electronically Signed   By: Marin Roberts M.D.   On: 12/14/2022 16:20   DG Chest Portable 1 View  Result Date: 12/14/2022 CLINICAL DATA:  Altered mental status, evaluate for infiltrate. EXAM: PORTABLE CHEST 1 VIEW COMPARISON:  Chest radiograph dated December 07, 2022 FINDINGS: The heart size and mediastinal contours are within normal limits. Low lung volumes without evidence of focal consolidation  or pleural effusion. Thoracic spondylosis and postsurgical changes for prior ACDF. No acute osseous abnormality. IMPRESSION: Low lung volumes without evidence of acute cardiopulmonary process. Electronically Signed   By: Larose Hires D.O.   On: 12/14/2022 15:50   DG Abdomen 1 View  Result Date: 12/14/2022 CLINICAL DATA:  Abdominal pain EXAM: ABDOMEN - 1 VIEW COMPARISON:  None Available. FINDINGS: Dilated loops of small bowel. No gas seen in the colon. Prior right total hip replacement. Orthopedic hardware of the lumbar spine. No acute osseous abnormality. IMPRESSION: Dilated loops of small bowel concerning for small-bowel obstruction. Abdominal and pelvis CT could be performed for better evaluation. Electronically Signed   By: Allegra Lai M.D.   On: 12/14/2022 15:21   DG Knee Complete 4 Views Right  Result Date: 12/08/2022 CLINICAL DATA:  Right knee pain and limited range of motion after a fall. Initial encounter. EXAM: RIGHT KNEE - COMPLETE 4+ VIEW COMPARISON:  None Available. FINDINGS: No evidence of  fracture, dislocation, or joint effusion. There is mild medial compartment joint space narrowing and osteophytosis. Soft tissues are unremarkable. IMPRESSION: No acute finding. Mild medial compartment degenerative change. Electronically Signed   By: Drusilla Kanner M.D.   On: 12/08/2022 08:44   CT Head Wo Contrast  Result Date: 12/07/2022 CLINICAL DATA:  Altered mental status EXAM: CT HEAD WITHOUT CONTRAST TECHNIQUE: Contiguous axial images were obtained from the base of the skull through the vertex without intravenous contrast. RADIATION DOSE REDUCTION: This exam was performed according to the departmental dose-optimization program which includes automated exposure control, adjustment of the mA and/or kV according to patient size and/or use of iterative reconstruction technique. COMPARISON:  None Available. FINDINGS: Brain: No evidence of acute infarction, hemorrhage, hydrocephalus, extra-axial collection or mass lesion/mass effect. Vascular: No hyperdense vessel or unexpected calcification. Skull: Normal. Negative for fracture or focal lesion. Sinuses/Orbits: There is dense opacification in atelectasis of the left maxillary sinus in keeping with changes of chronic sinusitis. Remaining paranasal sinuses are clear. Orbits are unremarkable. Other: Mastoid air cells and middle ear cavities are clear IMPRESSION: 1. No acute intracranial abnormality. 2. Chronic left maxillary sinusitis. Electronically Signed   By: Helyn Numbers M.D.   On: 12/07/2022 21:24   DG Chest Port 1 View  Result Date: 12/07/2022 CLINICAL DATA:  Dyspnea, hypoglycemia EXAM: PORTABLE CHEST 1 VIEW COMPARISON:  None Available. FINDINGS: Lungs volumes are small, but are symmetric and are clear. No pneumothorax or pleural effusion. Cardiac size within normal limits. Pulmonary vascularity is normal. Osseous structures are age-appropriate. No acute bone abnormality. IMPRESSION: No active disease. Electronically Signed   By: Helyn Numbers M.D.    On: 12/07/2022 21:00    Microbiology: Results for orders placed or performed during the hospital encounter of 12/18/22  C Difficile Quick Screen w PCR reflex     Status: None   Collection Time: 12/19/22  4:51 AM   Specimen: STOOL  Result Value Ref Range Status   C Diff antigen NEGATIVE NEGATIVE Final   C Diff toxin NEGATIVE NEGATIVE Final   C Diff interpretation No C. difficile detected.  Final    Comment: Performed at [facility], [address], [place], [place] [address]  CSF culture w Gram Stain     Status: None   Collection Time: 12/19/22  2:39 PM   Specimen: PATH Cytology CSF; Cerebrospinal Fluid  Result Value Ref Range Status   Specimen Description CSF  Final   Special Requests NONE  Final   Gram Stain  NO WBC SEEN NO ORGANISMS SEEN CYTOSPIN SMEAR   Final   Culture   Final    NO GROWTH 3 DAYS Performed at [facility], [address] [place], [place] [address]    Report Status 12/23/2022 FINAL  Final  Culture, blood (Routine X 2) w Reflex to ID Panel     Status: None (Preliminary result)   Collection Time: 12/24/22 10:08 AM   Specimen: BLOOD  Result Value Ref Range Status   Specimen Description BLOOD RIGHT ANTECUBITAL  Final   Special Requests   Final    BOTTLES DRAWN AEROBIC AND ANAEROBIC Blood Culture adequate volume   Culture   Final    NO GROWTH < 24 HOURS Performed at [facility], [address] [place], [place] [address]    Report Status PENDING  Incomplete  Culture, blood (Routine X 2) w Reflex to ID Panel     Status: None (Preliminary result)   Collection Time: 12/24/22 10:10 AM   Specimen: BLOOD RIGHT ARM  Result Value Ref Range Status   Specimen Description BLOOD RIGHT ARM  Final   Special Requests   Final    BOTTLES DRAWN AEROBIC ONLY Blood Culture adequate volume   Culture   Final    NO GROWTH < 24 HOURS Performed at [facility], [address], [place], [place] [address]    Report Status PENDING  Incomplete     Labs: CBC: Recent Labs  Lab 12/20/22 0000 12/24/22 0345 12/25/22 0506  WBC 7.8 16.4* 15.4*  NEUTROABS 5.1 13.1* 10.1*  HGB 12.6 11.6* 11.3*  HCT 36.9 34.1* 34.5*  MCV 87.4 88.6 89.6  PLT 150 326 347   Basic Metabolic Panel: Recent Labs  Lab 12/19/22 0300 12/20/22 0000 12/20/22 1059 12/21/22 0721 12/22/22 0426 12/24/22 0345  NA 135 136 135 136 135 136  K 2.8* 2.8* 3.3* 3.0* 3.9 3.8  CL 98 95* 97* 94* 99 97*  CO2 25 27 28 29 31 31   GLUCOSE 105* 91 75 140* 158* 184*  BUN <5* <5* <5* <5* 7 <5*  CREATININE 0.54 0.47 0.46 0.59 0.46 0.51  CALCIUM 7.5* 7.9* 7.6* 8.3* 8.2* 8.4*  MG 2.0 2.2  --   --   --  2.1   Liver Function Tests: Recent Labs  Lab 12/19/22 0300 12/24/22 0345  AST 45* 21  ALT 22 20  ALKPHOS 77 86  BILITOT 0.9 0.3  PROT 5.0* 5.6*  ALBUMIN 1.8* 2.4*   CBG: Recent Labs  Lab 12/24/22 1638 12/24/22 2118 12/25/22 0605 12/25/22 1147 12/25/22 1659  GLUCAP 322* 269* 131* 206* 139*    Discharge time spent: 48 MINUTES.   Signed: Kathlen Mody, MD [facility] 12/25/2022

## 2022-12-25 NOTE — Progress Notes (Signed)
Nutrition Follow-up  DOCUMENTATION CODES:  Severe malnutrition in context of acute illness/injury  INTERVENTION:  Continue regular diet, adjust to ordering assistance to aid in meal selection Multivitamin w/ minerals daily Ensure Enlive po BID, each supplement provides 350 kcal and 20 grams of protein.  NUTRITION DIAGNOSIS:  Severe Malnutrition related to acute illness as evidenced by severe muscle depletion, percent weight loss. - ongoing   GOAL:  Patient will meet greater than or equal to 90% of their needs - progressing   MONITOR:  PO intake, Supplement acceptance, Labs, I & O's  REASON FOR ASSESSMENT:  Other (Comment) (Cortrak List)    ASSESSMENT:  57 y.o. female transferred from [facility] for long-term EEG, after presenting for FTT and encephalopathic. PMH Includes chronic pancreatitis, GERD, DDD, depression, giant cell arteritis, HTN, and hypothyroidism.   4/26 - admitted to [facility] 4/30 - transferred to [facility] 5/02 - SLP evaluation, diet advanced to regular 5/3 - pt refused cortrak   Pt resting in bedside chair at the time of assessment. States that she wants to go home.   Pt with poor PO intake of meals over the last few days. Pt states that the food is "nasty." Pt reports that she has been receiving her trays, but that she has not been selecting her food. Will add ordering assistance to see if choosing her items helps with intake. Pt does report that she has been drinking 2-3 ensures each day.   Average Meal Intake: 5/3-5/4: 25% intake x 5 recorded meals  Nutritionally Relevant Medications: Scheduled Meds:  feeding supplement  237 mL Oral BID BM   insulin aspart  0-6 Units Subcutaneous TID WC   multivitamin with minerals  1 tablet Oral Daily   Continuous Infusions:  thiamine (VITAMIN B1) injection 250 mg (12/24/22 1226)   PRN Meds: ondansetron  Labs Reviewed  NUTRITION - FOCUSED PHYSICAL EXAM: Flowsheet Row Most Recent Value  Orbital Region No depletion  Upper  Arm Region Mild depletion  Thoracic and Lumbar Region No depletion  Buccal Region No depletion  Temple Region Unable to assess  [EEG Leads]  Clavicle Bone Region Moderate depletion  Clavicle and Acromion Bone Region Moderate depletion  Scapular Bone Region Severe depletion  Dorsal Hand Severe depletion  Patellar Region Severe depletion  Anterior Thigh Region Severe depletion  Posterior Calf Region Severe depletion  Edema (RD Assessment) None  Hair Reviewed  Eyes Reviewed  Mouth Reviewed  Skin Reviewed  Nails Reviewed   Diet Order:   Diet Order             Diet regular Room service appropriate? Yes with Assist; Fluid consistency: Thin  Diet effective now                   EDUCATION NEEDS:  Not appropriate for education at this time  Skin:  Skin Assessment: Reviewed RN Assessment  Last BM:  5/6 - type 5  Height:  Ht Readings from Last 1 Encounters:  12/18/22 5\' 9"  (1.753 m)   Weight:  Wt Readings from Last 1 Encounters:  12/25/22 85.7 kg   Ideal Body Weight:  65.9 kg  BMI:  Body mass index is 27.9 kg/m.  Estimated Nutritional Needs:  Kcal:  2150-2350 Protein:  105-125 grams Fluid:  >/= 2 L    Greig Castilla, RD, LDN Clinical Dietitian RD pager # available in [facility]  After hours/weekend pager # available in [facility]

## 2022-12-25 NOTE — Evaluation (Signed)
Physical Therapy Evaluation Patient Details Name: Abigail [name] [facility] MRN: [id number] DOB: [birth year] Today's Date: 12/25/2022  History of Present Illness  Pt is 57 yo female who presents on 12/07/22 with AMS/ unresponsiveness at home and was given Narcan and was hypoglycemic. Recent admission at [facility] for polypharmacy and hypoglycemia/ hypokalemia. Since admission, full body pain, esp abdominal has been a major issue. 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 Pt with seizures while admitted. PMH: fibromyalgia, chronic opioid use, GERD, RA, CPS, HTN, hypothyroidism, DDD, IBS,visual impairment since 12/23, heavy smoker.  Clinical Impression  Pt admitted with above diagnosis. Pt asleep and difficult to arouse initially. Needed tot A +2 to come to EOB due to this. Once pt awake she was participatory in session. Needed mod A +2 for sit>stand and pivot to chair with bilateral HHA. Pt with some unsafe mvmt patterns and is a high fall risk. Pt adamant about going home and agreeable to HHPT and states that her daughter and sister can help her 24/7. However, if this is not the case, pt is at a functional level appropriate for further inpt therapy. MD planning to speak to family about options.  Pt currently with functional limitations due to the deficits listed below (see PT Problem List). Pt will benefit from acute skilled PT to increase their independence and safety with mobility to allow discharge.          Recommendations for follow up therapy are one component of a multi-disciplinary discharge planning process, led by the attending physician.  Recommendations may be updated based on patient status, additional functional criteria and insurance authorization.  Follow Up Recommendations       Assistance Recommended at Discharge Frequent or constant Supervision/Assistance  Patient can return home with the following  Two people to help with walking and/or transfers;Two people to help with  bathing/dressing/bathroom;Assistance with cooking/housework;Direct supervision/assist for medications management;Direct supervision/assist for financial management;Help with stairs or ramp for entrance;Assist for transportation    Equipment Recommendations None recommended by PT  Recommendations for Other Services       Functional Status Assessment Patient has had a recent decline in their functional status and demonstrates the ability to make significant improvements in function in a reasonable and predictable amount of time.     Precautions / Restrictions Precautions Precautions: Fall Restrictions Weight Bearing Restrictions: No      Mobility  Bed Mobility Overal bed mobility: Needs Assistance Bed Mobility: Supine to Sit     Supine to sit: Total assist, +2 for physical assistance, +2 for safety/equipment, HOB elevated     General bed mobility comments: Total A x 2 due to lethargy; assisted EOB in attempts to promote alertness    Transfers Overall transfer level: Needs assistance Equipment used: Rolling walker (2 wheels), 2 person hand held assist Transfers: Sit to/from Stand, Bed to chair/wheelchair/BSC Sit to Stand: Mod assist, +2 physical assistance, +2 safety/equipment   Step pivot transfers: Mod assist, +2 physical assistance, +2 safety/equipment       General transfer comment: cued to scoot closer to edge for standing w/ RW placed in front of pt. Pt scooting too far forward, requiring significant assist to prevent fall and to scoot back onto bed. trialed w/ 2 person handheld assist next with knee blocking and improved ability to stand and step to recliner w/ Mod A x 2    Ambulation/Gait               General Gait Details: unable to advance  ambulation  Stairs            Wheelchair Mobility    Modified Rankin (Stroke Patients Only)       Balance Overall balance assessment: Needs assistance Sitting-balance support: No upper extremity supported,  Feet supported Sitting balance-Leahy Scale: Poor Sitting balance - Comments: initially required Min A to correct sitting balance w/ posterior and right lean but then improved to fair sitting balance Postural control: Right lateral lean Standing balance support: Bilateral upper extremity supported, During functional activity Standing balance-Leahy Scale: Poor Standing balance comment: 2 person assist needed to maintain standing                             Pertinent Vitals/Pain Pain Assessment Pain Assessment: Faces Faces Pain Scale: Hurts little more Pain Location: chronic back pain Pain Descriptors / Indicators: Grimacing, Guarding Pain Intervention(s): Monitored during session, Repositioned, Limited activity within patient's tolerance, Patient requesting pain meds-RN notified    Home Living Family/patient expects to be discharged to:: Private residence Living Arrangements: Children Available Help at Discharge: Family;Available 24 hours/day Type of Home: Mobile home Home Access: Ramped entrance       Home Layout: One level Home Equipment: Agricultural consultant (2 wheels);Wheelchair - Set designer (4 wheels) Additional Comments: lives with son who works, daughter who is not currently working and sister is coming up from [place] to help    Prior Function Prior Level of Function : Needs assist             Mobility Comments: reports use of walker vs wheelchair ADLs Comments: reports daughter assists with showering tasks as needed though pt typically able to complete other ADLs. Family manage IADLs     Hand Dominance   Dominant Hand: Right    Extremity/Trunk Assessment   Upper Extremity Assessment Upper Extremity Assessment: Defer to OT evaluation    Lower Extremity Assessment Lower Extremity Assessment: Generalized weakness    Cervical / Trunk Assessment Cervical / Trunk Assessment: Kyphotic  Communication   Communication: Expressive  difficulties;Other (comment) (slower response time)  Cognition Arousal/Alertness: Awake/alert, Lethargic, Suspect due to medications Behavior During Therapy: Flat affect, WFL for tasks assessed/performed Overall Cognitive Status: Impaired/Different from baseline Area of Impairment: Attention, Following commands, Safety/judgement, Awareness, Problem solving                   Current Attention Level: Sustained   Following Commands: Follows one step commands inconsistently Safety/Judgement: Decreased awareness of safety, Decreased awareness of deficits Awareness: Emergent, Intellectual Problem Solving: Slow processing, Decreased initiation, Difficulty sequencing, Requires verbal cues, Requires tactile cues General Comments: initially very lethargic but alertness improved with activity. able to follow commands though decreased insight into safety as pt scooting too close to EOB increasing fall risk, etc. Slower processing and response time; tearful once reporting desire to go home. able to express needs        General Comments General comments (skin integrity, edema, etc.): VSS    Exercises     Assessment/Plan    PT Assessment Patient needs continued PT services  PT Problem List Decreased strength;Decreased activity tolerance;Decreased balance;Decreased mobility;Decreased safety awareness;Decreased knowledge of precautions;Pain       PT Treatment Interventions DME instruction;Gait training;Functional mobility training;Therapeutic activities;Therapeutic exercise;Balance training;Patient/family education    PT Goals (Current goals can be found in the Care Plan section)  Acute Rehab PT Goals Patient Stated Goal: return home ASAP PT Goal Formulation: With patient Time For Goal  Achievement: 01/08/23 Potential to Achieve Goals: Fair    Frequency Min 3X/week     Co-evaluation PT/OT/SLP Co-Evaluation/Treatment: Yes Reason for Co-Treatment: For patient/therapist safety;To  address functional/ADL transfers;Necessary to address cognition/behavior during functional activity;Complexity of the patient's impairments (multi-system involvement) PT goals addressed during session: Mobility/safety with mobility;Balance;Proper use of DME OT goals addressed during session: ADL's and self-care;Strengthening/ROM;Proper use of Adaptive equipment and DME       AM-PAC PT "6 Clicks" Mobility  Outcome Measure Help needed turning from your back to your side while in a flat bed without using bedrails?: Total Help needed moving from lying on your back to sitting on the side of a flat bed without using bedrails?: Total Help needed moving to and from a bed to a chair (including a wheelchair)?: A Lot Help needed standing up from a chair using your arms (e.g., wheelchair or bedside chair)?: A Lot Help needed to walk in hospital room?: A Lot Help needed climbing 3-5 steps with a railing? : A Lot 6 Click Score: 10    End of Session Equipment Utilized During Treatment: Gait belt Activity Tolerance: Patient tolerated treatment well Patient left: in chair;with call bell/phone within reach;with chair alarm set Nurse Communication: Mobility status PT Visit Diagnosis: Muscle weakness (generalized) (M62.81);Pain;Difficulty in walking, not elsewhere classified (R26.2);Unsteadiness on feet (R26.81) Pain - part of body:  (back)    Time: 4540-9811 PT Time Calculation (min) (ACUTE ONLY): 32 min   Charges:   PT Evaluation $PT Eval Moderate Complexity: 1 Mod          Lyanne Co, PT  Acute Rehab Services Secure chat preferred Office [phone]   Lawana Chambers Charlott Calvario 12/25/2022, 1:07 PM

## 2022-12-25 NOTE — TOC Initial Note (Addendum)
Transition of Care [facility]) - Initial/Assessment Note    Patient Details  Name: Abigail [name] [facility] MRN: [id number] Date of Birth: [birth year]  [facility]) CM/SW Contact:    Kermit Balo, RN Phone Number: 12/25/2022, 1:16 PM  Clinical Narrative:                 Patient is from home with her son and his girlfriend. Current recommendations are for [facility] due to pt is refusing rehab. Son and patients mother know she needs rehab and are attempting to convince her to go.  Pt is currently 2 person assist. Son says they have scooters at home and a hospital bed. She was active with [facility] prior to admission for [facility] therapies.  TOC following for dc disposition.  1606: Pt is demanding to d/c home. Son needs overnight to get hospital bed in place. They will transport her home tomorrow. MD updated. Pt aware.   Expected Discharge Plan: Home w Home Health Services Barriers to Discharge: Continued Medical Work up   Patient Goals and CMS Choice   CMS Medicare.gov Compare Post Acute Care list provided to:: Patient Choice offered to / list presented to : Patient, Adult Children      Expected Discharge Plan and Services   Discharge Planning Services: CM Consult   Living arrangements for the past 2 months: Single Family Home                               Date [facility] Agency Contacted: 12/25/22   Representative spoke with at [facility] Agency: Christian  Prior Living Arrangements/Services Living arrangements for the past 2 months: Single Family Home Lives with:: Adult Children Patient language and need for interpreter reviewed:: Yes Do you feel safe going back to the place where you live?: Yes      Need for Family Participation in Patient Care: Yes (Comment) Care giver support system in place?: Yes (comment) Current home services: DME (hospital bed/ scooter/ walker) Criminal Activity/Legal Involvement Pertinent to Current Situation/Hospitalization: No - Comment as  needed  Activities of Daily Living      Permission Sought/Granted                  Emotional Assessment Appearance:: Appears stated age Attitude/Demeanor/Rapport: Crying Affect (typically observed): Depressed Orientation: : Oriented to Self, Oriented to Place, Oriented to Situation   Psych Involvement: No (comment)  Admission diagnosis:  Status epilepticus (HCC) [G40.901] Patient Active Problem List   Diagnosis Date Noted   Protein-calorie malnutrition, severe 12/21/2022   GCA (giant cell arteritis) (HCC) 12/19/2022   DNR (do not resuscitate) 12/19/2022   Chronic pancreatitis (HCC) 12/18/2022   Acute colitis 12/18/2022   Status epilepticus (HCC) 12/18/2022   Essential hypertension 12/08/2022   Hypothyroidism 12/08/2022   Hypokalemia 12/08/2022   Hypoglycemia 12/08/2022   Acute metabolic encephalopathy 12/07/2022   Asthma 04/26/2020   Vertigo 04/26/2020   Drug-induced constipation 04/26/2020   Nausea 04/26/2020   Gastroesophageal reflux disease 11/12/2016   Chronic prescription opiate use - thru [facility] in [place], [place] 04/29/2016   Long term current use of immunosuppressive drug 04/29/2016   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 [place] [address] Phone: [phone] Fax: [phone]  [facility] [phone] [place], [place] - [address] [place] [address] Phone: [phone] Fax: [phone]  [id number] - [place], [place] - [address] [place] [address] Phone: [phone] Fax: [phone]     Social Determinants of Health (SDOH) Social History: SDOH Screenings   Food Insecurity: Food  Insecurity Present (12/14/2022)  Transportation Needs: No Transportation Needs (12/08/2022)  Tobacco Use: High Risk (12/19/2022)   SDOH Interventions:     Readmission Risk Interventions     No data to display

## 2022-12-25 NOTE — Progress Notes (Addendum)
Subjective: NAEO. Mother at bedside. Patient still requesting to go home instead of rehab  ROS: negative except above Examination  Vital signs in last 24 hours: Temp:  [97.6 F (36.4 C)-98 F (36.7 C)] 97.6 F (36.4 C) (05/07 1600) Pulse Rate:  [84-108] 103 (05/07 1600) Resp:  [15-20] 20 (05/07 1600) BP: (106-152)/(59-86) 121/59 (05/07 1600) SpO2:  [92 %-100 %] 93 % (05/07 1600) Weight:  [85.7 kg] 85.7 kg (05/07 0500)  General: lying in bed, NAD Neuro: MS: Alert, oriented, follows commands CN: pupils equal and reactive,  EOMI, face symmetric, tongue midline, normal sensation over face Motor: 4/5 strength in all 4 extremities Coordination: normal Gait: not tested  Basic Metabolic Panel: Recent Labs  Lab 12/19/22 0300 12/20/22 0000 12/20/22 1059 12/21/22 0721 12/22/22 0426 12/24/22 0345  NA 135 136 135 136 135 136  K 2.8* 2.8* 3.3* 3.0* 3.9 3.8  CL 98 95* 97* 94* 99 97*  CO2 25 27 28 29 31 31   GLUCOSE 105* 91 75 140* 158* 184*  BUN <5* <5* <5* <5* 7 <5*  CREATININE 0.54 0.47 0.46 0.59 0.46 0.51  CALCIUM 7.5* 7.9* 7.6* 8.3* 8.2* 8.4*  MG 2.0 2.2  --   --   --  2.1    CBC: Recent Labs  Lab 12/20/22 0000 12/24/22 0345 12/25/22 0506  WBC 7.8 16.4* 15.4*  NEUTROABS 5.1 13.1* 10.1*  HGB 12.6 11.6* 11.3*  HCT 36.9 34.1* 34.5*  MCV 87.4 88.6 89.6  PLT 150 326 347     Coagulation Studies: No results for input(s): "LABPROT", "INR" in the last 72 hours.  Imaging No new brain imaging.   ASSESSMENT AND PLAN: 57 year old female presented to [facility] on 12/14/2022 with altered mental status.  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  Therefore she was transferred to [facility] for long-term EEG.   Focal convulsive status epilepticus. resolved Focal seizures -Unclear etiology of focal status epilepticus.  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 Sherryll Burger at [facility] as patient is from [place].  Can consider repeat MRI brain with and without contrast in 2 to 3 months - Messaged rheumatologist Dr Allena Katz for follow up. Also requested to see if methotrexate could be the cause of PRES and if we can use a different agent. -Management of rest of comorbidities per primary team -Discussed plan with Dr. Blake Divine via secure chat   Seizure precautions: Per [facility] statutes, patients with seizures are not allowed to drive until they have been seizure-free for six months and cleared by a physician    Use caution when using heavy equipment or power tools. Avoid working on ladders or at heights. Take showers instead of baths. Ensure the water temperature is not too high on the home water heater. Do not go swimming alone. Do not lock yourself in a room alone (i.e. bathroom). When caring for infants or small children, sit down when holding, feeding, or changing them to minimize risk  of injury to the child in the event you have a seizure. Maintain good sleep hygiene. Avoid alcohol.    If patient has another seizure, call 911 and bring them back to the ED if: A.  The seizure lasts longer than 5 minutes.      B.  The patient doesn't wake shortly after the seizure or has new problems such as difficulty seeing, speaking or moving following the seizure C.  The patient was injured during the seizure D.  The patient has a temperature over 102 F (39C) E.  The patient vomited  during the seizure and now is having trouble breathing    During the Seizure   - First, ensure adequate ventilation and place patients on the floor on their left side  Loosen clothing around the neck and ensure the airway is patent. If the patient is clenching the teeth, do not force the mouth open with any object as this can cause severe damage - Remove all items from the surrounding that can be hazardous. The patient may be oblivious to what's happening and may not even know what he or she is doing. If the patient is confused and wandering, either gently guide him/her away and block access to outside areas - Reassure the individual and be comforting - Call 911. In most cases, the seizure ends before EMS arrives. However, there are cases when seizures may last over 3 to 5 minutes. Or the individual may have developed breathing difficulties or severe injuries. If a pregnant patient or a person with diabetes develops a seizure, it is prudent to call an ambulance. - Finally, if the patient does not regain full consciousness, then call EMS. Most patients will remain confused for about 45 to 90 minutes after a seizure, so you must use judgment in calling for help.     After the Seizure (Postictal Stage)   After a seizure, most patients experience confusion, fatigue, muscle pain and/or a headache. Thus, one should permit the individual to sleep. For the next few days, reassurance is essential. Being calm and helping reorient the person is also of importance.   Most seizures are painless and end spontaneously. Seizures are not harmful to others but can lead to complications such as stress on the lungs, brain and the heart. Individuals with prior lung problems may develop labored breathing and respiratory distress.      I have spent a total of 36 minutes with the patient reviewing hospital notes,  test results, labs and examining the patient as well as establishing an assessment and plan that was  discussed personally with the patient.  > 50% of time was spent in direct patient care.   Lindie Spruce Epilepsy [facility] For questions after 5pm please refer to AMION to reach the Neurologist on call

## 2022-12-25 NOTE — Evaluation (Signed)
Occupational Therapy Evaluation Patient Details Name: Abigail [name] [facility] MRN: [id number] DOB: Jul 31, 1966 Today's Date: 12/25/2022   History of Present Illness Pt is 57 yo female who presents on 12/07/22 with AMS/ unresponsiveness at home and was given Narcan and was hypoglycemic. Recent admission at [facility] for polypharmacy and hypoglycemia/ hypokalemia. Since admission, full body pain, esp abdominal has been a major issue. 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 Pt with seizures while admitted. PMH: fibromyalgia, chronic opioid use, GERD, RA, CPS, HTN, hypothyroidism, DDD, IBS,visual impairment since 12/23, heavy smoker.   Clinical Impression   PTA, pt lives with her adult children, reports use of walker vs wheelchair for mobility in the home. Pt typically able to manage ADLs though daughter will assist w/ showering tasks as needed. Pt presents now initially very lethargic but once assisted EOB with Total A x 2, did become more alert and participatory. Overall, pt requires Min A for UB ADL, up to Max A x 2 for LB ADLs and Mod A x 2 for basic pivots. Pt at high risk for falls without +2 assist though insistent on going home at discharge. Pt does report her sister is also coming up from [place] to help out. Discussed w/ MD who plans to talk to family and determine ability to provide the needed assist.       Recommendations for follow up therapy are one component of a multi-disciplinary discharge planning process, led by the attending physician.  Recommendations may be updated based on patient status, additional functional criteria and insurance authorization.   Assistance Recommended at Discharge Frequent or constant Supervision/Assistance  Patient can return home with the following Two people to help with walking and/or transfers;Two people to help with bathing/dressing/bathroom    Functional Status Assessment  Patient has had a recent decline in their functional status and  demonstrates the ability to make significant improvements in function in a reasonable and predictable amount of time.  Equipment Recommendations  BSC/3in1    Recommendations for Other Services       Precautions / Restrictions Precautions Precautions: Fall Restrictions Weight Bearing Restrictions: No      Mobility Bed Mobility Overal bed mobility: Needs Assistance Bed Mobility: Supine to Sit     Supine to sit: Total assist, +2 for physical assistance, +2 for safety/equipment, HOB elevated     General bed mobility comments: Total A x 2 due to lethargy; assisted EOB in attempts to promote alertness    Transfers Overall transfer level: Needs assistance Equipment used: Rolling walker (2 wheels), 2 person hand held assist Transfers: Sit to/from Stand, Bed to chair/wheelchair/BSC Sit to Stand: Mod assist, +2 physical assistance, +2 safety/equipment     Step pivot transfers: Mod assist, +2 physical assistance, +2 safety/equipment     General transfer comment: cued to scoot closer to edge for standing w/ RW placed in front of pt. Pt scooting too far forward, requiring significant assist to prevent fall and to scoot back onto bed. trialed w/ 2 person handheld assist next with knee blocking and improved ability to stand and step to recliner w/ Mod A x 2      Balance Overall balance assessment: Needs assistance Sitting-balance support: No upper extremity supported, Feet supported Sitting balance-Leahy Scale: Poor Sitting balance - Comments: initially required Min A to correct sitting balance w/ posterior then but then improved to fair sitting balance Postural control: Right lateral lean Standing balance support: Bilateral upper extremity supported, During functional activity Standing balance-Leahy Scale: Poor  ADL either performed or assessed with clinical judgement   ADL Overall ADL's : Needs assistance/impaired Eating/Feeding: Set  up;Sitting Eating/Feeding Details (indicate cue type and reason): assist to open containers and prep food. able to hold cup and drink from it when prompted Grooming: Minimal assistance;Sitting   Upper Body Bathing: Sitting;Moderate assistance   Lower Body Bathing: Moderate assistance;+2 for safety/equipment;+2 for physical assistance;Sit to/from stand   Upper Body Dressing : Minimal assistance;Sitting   Lower Body Dressing: Moderate assistance;+2 for physical assistance;+2 for safety/equipment;Sit to/from stand   Toilet Transfer: Moderate assistance;+2 for physical assistance;+2 for safety/equipment;Stand-pivot   Toileting- Clothing Manipulation and Hygiene: Maximal assistance;+2 for physical assistance;+2 for safety/equipment;Sit to/from stand;Sitting/lateral lean         General ADL Comments: Limited by weakness w/ prolonged time in bed, recent prior transfer to this hospital and prior [facility] hospitalization. Significant weakness, slower processing w/ recent seizure activity     Vision Ability to See in Adequate Light: 0 Adequate Patient Visual Report: No change from baseline Vision Assessment?: No apparent visual deficits     Perception     Praxis      Pertinent Vitals/Pain Pain Assessment Pain Assessment: Faces Faces Pain Scale: Hurts little more Pain Location: chronic back pain Pain Descriptors / Indicators: Grimacing, Guarding Pain Intervention(s): Monitored during session, Limited activity within patient's tolerance, Patient requesting pain meds-RN notified     Hand Dominance Right   Extremity/Trunk Assessment Upper Extremity Assessment Upper Extremity Assessment: Generalized weakness   Lower Extremity Assessment Lower Extremity Assessment: Defer to PT evaluation   Cervical / Trunk Assessment Cervical / Trunk Assessment: Kyphotic   Communication Communication Communication: Expressive difficulties;Other (comment) (slower response time)   Cognition  Arousal/Alertness: Awake/alert, Lethargic, Suspect due to medications Behavior During Therapy: Flat affect, WFL for tasks assessed/performed Overall Cognitive Status: Impaired/Different from baseline Area of Impairment: Attention, Following commands, Safety/judgement, Awareness, Problem solving                   Current Attention Level: Sustained   Following Commands: Follows one step commands inconsistently Safety/Judgement: Decreased awareness of safety, Decreased awareness of deficits Awareness: Emergent, Intellectual Problem Solving: Slow processing, Decreased initiation, Difficulty sequencing, Requires verbal cues, Requires tactile cues General Comments: initially very lethargic but alertness improved with activity. able to follow commands though decreased insight into safety as pt scooting too close to EOB increasing fall risk, etc. Slower processing and response time; tearful once reporting desire to go home. able to express needs     General Comments       Exercises     Shoulder Instructions      Home Living Family/patient expects to be discharged to:: Private residence Living Arrangements: Children Available Help at Discharge: Family;Available 24 hours/day Type of Home: Mobile home Home Access: Ramped entrance     Home Layout: One level     Bathroom Shower/Tub: Producer, television/film/video: Standard     Home Equipment: Agricultural consultant (2 wheels);Wheelchair - Set designer (4 wheels)   Additional Comments: lives with son who works, daughter who is not currently working and sister is coming up from [place] to help      Prior Functioning/Environment Prior Level of Function : Needs assist             Mobility Comments: reports use of walker vs wheelchair ADLs Comments: reports daughter assists with showering tasks as needed though pt typically able to complete other ADLs. Family manage IADLs  OT Problem List:  Decreased strength;Decreased activity tolerance;Impaired balance (sitting and/or standing);Decreased cognition;Decreased safety awareness;Decreased knowledge of use of DME or AE;Pain      OT Treatment/Interventions: Self-care/ADL training;Therapeutic exercise;Energy conservation;DME and/or AE instruction;Therapeutic activities;Patient/family education    OT Goals(Current goals can be found in the care plan section) Acute Rehab OT Goals Patient Stated Goal: go home OT Goal Formulation: With patient Time For Goal Achievement: 01/08/23 Potential to Achieve Goals: Good ADL Goals Pt Will Perform Grooming: with min guard assist;standing Pt Will Perform Lower Body Dressing: with min assist;sit to/from stand;sitting/lateral leans Pt Will Transfer to Toilet: with min guard assist;stand pivot transfer;bedside commode Pt/caregiver will Perform Home Exercise Program: Increased strength;Both right and left upper extremity;With theraband;With written HEP provided;With Supervision  OT Frequency: Min 2X/week    Co-evaluation PT/OT/SLP Co-Evaluation/Treatment: Yes Reason for Co-Treatment: For patient/therapist safety;To address functional/ADL transfers;Necessary to address cognition/behavior during functional activity;Complexity of the patient's impairments (multi-system involvement)   OT goals addressed during session: ADL's and self-care;Strengthening/ROM;Proper use of Adaptive equipment and DME      AM-PAC OT "6 Clicks" Daily Activity     Outcome Measure Help from another person eating meals?: A Little Help from another person taking care of personal grooming?: A Little Help from another person toileting, which includes using toliet, bedpan, or urinal?: A Lot Help from another person bathing (including washing, rinsing, drying)?: A Lot Help from another person to put on and taking off regular upper body clothing?: A Little Help from another person to put on and taking off regular lower body  clothing?: A Lot 6 Click Score: 15   End of Session Equipment Utilized During Treatment: Gait belt Nurse Communication: Mobility status  Activity Tolerance: Patient tolerated treatment well Patient left: in chair;with call bell/phone within reach;with chair alarm set  OT Visit Diagnosis: Unsteadiness on feet (R26.81);Other abnormalities of gait and mobility (R26.89);Muscle weakness (generalized) (M62.81);Other symptoms and signs involving cognitive function                Time: 1610-9604 OT Time Calculation (min): 39 min Charges:  OT General Charges $OT Visit: 1 Visit OT Evaluation $OT Eval Moderate Complexity: 1 Mod OT Treatments $Self Care/Home Management : 8-22 mins  Bradd Canary, OTR/L Acute Rehab Services Office: [phone]   Lorre Munroe 12/25/2022, 11:13 AM

## 2022-12-25 NOTE — Consult Note (Signed)
   [facility] CM Inpatient Consult   12/25/2022  Abigail [name] [facility] 1966-07-17 [id number]  [facility]) Accountable Care Organization (ACO) [facility] Liaison Note  Location: [facility] Liaison met patient at bedside at [facility].  Insurance: [employer]   Abigail [name] [employer] is a 57 y.o. 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 The patient was screened for 7 and 30 day readmission hospitalization with noted high risk score for unplanned readmission risk   The patient was assessed for potential [facility]) Care Management service needs for post hospital transition for care coordination. Review of patient's electronic medical record reveals patient is declining a skilled nursing facility level of care. Spoke with patient and she confirms PCP as Cheri Kearns, this is not a Transport planner.  Checked the latest [facility] roster and is no longer noted in the roster.   Plan: Provider is not in network.   Will sign off.   For questions contact:   Charlesetta Shanks, RN BSN CCM [facility] Toll free office [phone]  *Concierge Line  [phone] Fax number: [phone] [email or web address]

## 2022-12-25 NOTE — Consult Note (Signed)
[facility] Psychiatry Followup Face-to-Face Psychiatric Evaluation   Service Date: Dec 25, 2022 LOS:  LOS: 7 days    Assessment  Abigail [name] is a 57 y.o. 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 She carries the psychiatric diagnoses of MDD and has a past medical history of  rheumatoid arthritis, giant cell arteritis on chronic prednisone, chronic pain syndrome on chronic opiates through [facility] in [facility], chronic pancreatitis, hypothyroidism. Psychiatry was consulted for depression by primary team.   Her current presentation of tearfulness and confusion is most consistent with cognitive changes worsened by prolonged hospital stay and chronic back pain. She meets criteria for waxing and waning delirium based on intermittent sedation and mood lability noted during interview. Patient's mental status will likely improve with reintroduction of her home meds, better control of back pain, and continued work with physical therapy.   Of note patient has reported diagnosis of MDD however we suspect that acute onset of worsening cognitive and mood changs is more likely related to environmental factors and uncontrolled pain. She is not taking outpatient psychotropic medications but historically she has had a good response to SSRIs. On exam today (5/7), patient is less tearful and confused, endorsing less back pain.  Plan to restart Cymbalta for further management of back pain, recommend nicorette patch for tobacco use. Please re-consult if patient's cognitive status declines again.     Diagnoses:  Active Hospital problems: Principal Problem:   Status epilepticus (HCC) Active Problems:   Chronic prescription opiate use - thru [facility] in [place], [place]   RA (rheumatoid arthritis) (HCC)   Essential hypertension   Hypothyroidism   Hypokalemia   Chronic pancreatitis (HCC)   GCA (giant cell  arteritis) (HCC)   DNR (do not resuscitate)   Protein-calorie malnutrition, severe     Plan  ## Safety and Observation Level:  - Based on my clinical evaluation, I estimate the patient to be at low risk of self harm in the current setting - At this time, we recommend a q15 check level of observation. This decision is based on my review of the chart including patient's history and current presentation, interview of the patient, mental status examination, and consideration of suicide risk including evaluating suicidal ideation, plan, intent, suicidal or self-harm behaviors, risk factors, and protective factors. This judgment is based on our ability to directly address suicide risk, implement suicide prevention strategies and develop a safety plan while the patient is in the clinical setting. Please contact our team if there is a concern that risk level has changed.   ## Medications:  --Restart home Cymbalta for back pain; please re-consult if cognitive status symptoms worsens -- Recommend Nicotine patch prn; endorses home tobacco use      ## Medical Decision Making Capacity:  - Not formally assessed  ## Further Work-up:  -- none  -- most recent EKG on 5/6 had QtC of 416 -- Pertinent labwork reviewed earlier this admission includes: WBC 16.4 on 5/6   ## Disposition:  -- TBD  ## Behavioral / Environmental:  -- Continue delirium precautions   ##Legal Status - no IVC in place   Thank you for this consult request. Recommendations have been communicated to the primary team.  We will sign off at this time. Please re- consult of in the event of worsening derilrium.   Clovis Cao, Medical Student, MS4   NEW history  Relevant Aspects of  Hospital Course:  Admitted on 12/18/2022 for failure to thrive, found to have acute encephalopathy w/ focal status epilepticus on EEG. Per report, poor oral intake in the setting of abdominal pain prior to admission. She was started on Keppra, Vimpat and  phenytoin for seizure control. Patient is on prescription opiates for Chronic Pain Syndrome though [facility] and was started IV morphine 2 mg prn q2h. She is on chronic prednisone for GCA.   Patient Report:   Patient was seen around 63 today. Delirium  improved compared to yesterday; patient was awake and interactive for duration of interview, responded to question appropriately. Patient continues to be frustrated about discharge, repeatedly stating, "I want to go home", however she is less tearful during interview today. She was on continuous EEG.   Patient reports minimal pain today, well controlled on medications. She denies She denies any suicidal ideations. Denies HI, wanting to hurt others. She endorses good sleep, appropriate appetite.   She states that her sister is coming to visit her today, so she will be less lonely. Again states that she wants to go home because sister can stay with her in hospital.  She reports that PT is helpful, inquires about next session.   Discussed plan to restart Cymbalta with patient, which she agrees to.  Patient is aware of her name, reason admission, correctly states we are at [facility], [place], [place], [place]. Correctly identifies president as Duane Boston.    ROS:  Denies SI/HI/AVH   Collateral information:  ---  Psychiatric History:  Information collected from patient  Historical dx of MDD, was on SSRI many years ago that was helpful  No currently on any medication  Not currently seeing psychiatrist, therapist  Family psych history:  Father; depression  Social History:  Patient states that she lives with her son, 41 y/o. She has a daughter who is 70 y/o. Reports she is currently unemployed. Denies guns in the home. Reports current tobacco use. Denies Etoh or other substance use.    Family History:  The patient's family history is not on file.  Medical History: Past Medical History:  Diagnosis Date   Anxiety     Arthritis    Back pain    Basal cell carcinoma 10/04/2021   R axilla - needs excised 11/28/21   Basal cell carcinoma 10/04/2021   L antecubital excised 11/14/21   Diarrhea 11/12/2016   Fibromyalgia    Generalized abdominal pain 11/12/2016   H. pylori infection    Hyperlipidemia    IBS (irritable bowel syndrome)    Infectious colitis 04/29/2016   Migraines    Moderate dehydration 04/29/2016   Muscle pain    Opioid overdose (HCC) 12/07/2022   Reflux    Unexplained weight loss 11/12/2016    Surgical History: Past Surgical History:  Procedure Laterality Date   ABDOMINAL HYSTERECTOMY     APPENDECTOMY  2009   C5 FUSION     C6 FUSION     C7 FUSION     COLONOSCOPY  02/2006   COLONOSCOPY WITH PROPOFOL N/A 12/25/2016   Procedure: COLONOSCOPY WITH PROPOFOL;  Surgeon: Earline Mayotte, MD;  Location: [facility] END[facility]COPY;  Service: Endoscopy;  Laterality: N/A;   ESOPHAGOGASTRODUODENOSCOPY (EGD) WITH PROPOFOL N/A 12/25/2016   Procedure: ESOPHAGOGASTRODUODENOSCOPY (EGD) WITH PROPOFOL;  Surgeon: Earline Mayotte, MD;  Location: [facility] ENDOSCOPY;  Service: Endoscopy;  Laterality: N/A;   FOOT SURGERY     HIP ARTHROPLASTY     L4 FUSION  L5 FUSION     S1 FUSION      Medications:   Current Facility-Administered Medications:    acetaminophen (TYLENOL) suppository 650 mg, 650 mg, Rectal, Q4H PRN, Carollee Herter, DO   enoxaparin (LOVENOX) injection 40 mg, 40 mg, Subcutaneous, Q24H, Kathlen Mody, MD, 40 mg at 12/25/22 1119   feeding supplement (ENSURE ENLIVE / ENSURE PLUS) liquid 237 mL, 237 mL, Oral, BID BM, Kathlen Mody, MD, 237 mL at 12/25/22 0937   insulin aspart (novoLOG) injection 0-6 Units, 0-6 Units, Subcutaneous, TID WC, Bhagat, Srishti L, MD, 4 Units at 12/24/22 1702   lacosamide (VIMPAT) tablet 200 mg, 200 mg, Oral, BID, [name], MD, 200 mg at 12/25/22 0929   levETIRAcetam (KEPPRA) tablet 2,000 mg, 2,000 mg, Oral, BID, [name], MD, 2,000 mg at 12/25/22 0929    midazolam (VERSED) injection 2 mg, 2 mg, Intravenous, Q4H PRN, Bhagat, Srishti L, MD   morphine (PF) 2 MG/ML injection 2 mg, 2 mg, Intravenous, Q2H PRN, Carollee Herter, DO, 2 mg at 12/25/22 1610   multivitamin with minerals tablet 1 tablet, 1 tablet, Oral, Daily, Kathlen Mody, MD, 1 tablet at 12/25/22 0929   ondansetron (ZOFRAN) injection 4 mg, 4 mg, Intravenous, Q6H PRN, Kathlen Mody, MD   Oral care mouth rinse, 15 mL, Mouth Rinse, 4 times per day, Kathlen Mody, MD, 15 mL at 12/25/22 0930   Oral care mouth rinse, 15 mL, Mouth Rinse, PRN, Kathlen Mody, MD   phenytoin (DILANTIN) ER capsule 100 mg, 100 mg, Oral, BID, [name], MD, 100 mg at 12/25/22 0929   [COMPLETED] thiamine (VITAMIN B1) 500 mg in sodium chloride 0.9 % 50 mL IVPB, 500 mg, Intravenous, Q8H, Last Rate: 100 mL/hr at 12/20/22 2128, 500 mg at 12/20/22 2128 **FOLLOWED BY** thiamine (VITAMIN B1) 250 mg in sodium chloride 0.9 % 50 mL IVPB, 250 mg, Intravenous, Daily, Last Rate: 100 mL/hr at 12/25/22 1121, 250 mg at 12/25/22 1121 **FOLLOWED BY** [START ON 12/27/2022] thiamine (VITAMIN B1) injection 100 mg, 100 mg, Intravenous, Daily, Bhagat, Srishti L, MD  Allergies: Allergies  Allergen Reactions   Nsaids Hives   Tapentadol Swelling, Rash and Other (See Comments)    Nucynta- Made her deathly sick   Cephalexin Other (See Comments)    Reaction not cited   Codeine Other (See Comments)    Reaction not cited   Darvocet [Propoxyphene N-Acetaminophen] Other (See Comments)    Reaction not cited   Latex Other (See Comments)    Reaction not cited   Silicone Other (See Comments)    Reaction not cited   Sulfa Antibiotics Hives   Tape Other (See Comments)    Reaction not cited   Meloxicam Rash       Objective  Vital signs:  Temp:  [97.8 F (36.6 C)-98 F (36.7 C)] 97.8 F (36.6 C) (05/07 1149) Pulse Rate:  [84-108] 108 (05/07 1149) Resp:  [15-20] 20 (05/07 1149) BP: (106-152)/(66-86) 106/86 (05/07 1149) SpO2:  [92 %-100 %]  92 % (05/07 1149) Weight:  [85.7 kg] 85.7 kg (05/07 0500)  Psychiatric Specialty Exam:  Presentation  General Appearance: Disheveled  Eye Contact:Fair (improved today)  Speech:Normal Rate; Slow; Clear and Coherent (improving, not as tearful while speaking)  Speech Volume:Normal  Handedness:No data recorded  Mood and Affect  Mood:Irritable (tearful, improved compared to  yesterday)  Affect:Tearful   Thought Process  Thought Processes:Linear  Descriptions of Associations:Loose  Orientation:Full (Time, Place and Person)  Thought Content:Rumination ("wants to go home")  History of Schizophrenia/Schizoaffective disorder:No data recorded Duration of Psychotic Symptoms:No data recorded Hallucinations:Hallucinations: None  Ideas of Reference:None  Suicidal Thoughts:Suicidal Thoughts: No  Homicidal Thoughts:Homicidal Thoughts: No   Sensorium  Memory:Immediate Fair; Remote Fair  Judgment:Fair  Insight:Poor   Executive Functions  Concentration:Fair  Attention Span:Fair  Recall:Fair  Fund of Knowledge:Fair  Language:Fair   Psychomotor Activity  Psychomotor Activity:Psychomotor Activity: Normal   Assets  Assets:Social Support; Leisure Time   Sleep  Sleep:Sleep: Good    Physical Exam: Physical Exam Review of Systems  Constitutional: Negative.   Musculoskeletal:  Positive for back pain.  Psychiatric/Behavioral:  Negative for depression, hallucinations, substance abuse and suicidal ideas. The patient is nervous/anxious. The patient does not have insomnia.    Blood pressure 106/86, pulse (!) 108, temperature 97.8 F (36.6 C), temperature source Oral, resp. rate 20, weight 85.7 kg, SpO2 92 %. Body mass index is 27.9 kg/m.    *

## 2022-12-26 LAB — CULTURE, BLOOD (ROUTINE X 2)

## 2022-12-26 LAB — GLUCOSE, CAPILLARY
Glucose-Capillary: 135 mg/dL — ABNORMAL HIGH (ref 70–99)
Glucose-Capillary: 146 mg/dL — ABNORMAL HIGH (ref 70–99)

## 2022-12-26 MED ORDER — DULOXETINE HCL 30 MG PO CPEP: 30.0000 mg | ORAL_CAPSULE | Freq: Every day | ORAL | 1 refills | Status: DC

## 2022-12-26 MED ORDER — LEVETIRACETAM 1000 MG PO TABS: 2000.0000 mg | ORAL_TABLET | Freq: Two times a day (BID) | ORAL | 2 refills | Status: DC

## 2022-12-26 NOTE — Progress Notes (Signed)
Discharge instructions reviewed with the patient. 2 iv's removed and telemetry discontinued.patient belongings packed. Son will provide transport home.  Melony Overly, RN

## 2022-12-26 NOTE — Progress Notes (Signed)
Occupational Therapy Treatment Patient Details Name: Abigail [name] [facility] MRN: [id number] DOB: Dec 27, 1965 Today's Date: 12/26/2022   History of present illness Pt is 57 yo female who presents on 12/07/22 with AMS/ unresponsiveness at home and was given Narcan and was hypoglycemic. Recent admission at [facility] for polypharmacy and hypoglycemia/ hypokalemia. Since admission, full body pain, esp abdominal has been a major issue. 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 Pt with seizures while admitted. PMH: fibromyalgia, chronic opioid use, GERD, RA, CPS, HTN, hypothyroidism, DDD, IBS,visual impairment since 12/23, heavy smoker.   OT comments  Emphasis on LB ADLs in prep for pt planned DC home today. Pt continues to require Mod A for transfers and LB ADL completion with hands on assist needed for all standing due to poor balance control. Continue to feel pt would benefit from postacute rehab though noted pt receptive only to [facility] services at this time.   Recommendations for follow up therapy are one component of a multi-disciplinary discharge planning process, led by the attending physician.  Recommendations may be updated based on patient status, additional functional criteria and insurance authorization.    Assistance Recommended at Discharge Frequent or constant Supervision/Assistance  Patient can return home with the following  Two people to help with walking and/or transfers;Two people to help with bathing/dressing/bathroom   Equipment Recommendations  BSC/3in1    Recommendations for Other Services      Precautions / Restrictions Precautions Precautions: Fall Restrictions Weight Bearing Restrictions: No       Mobility Bed Mobility               General bed mobility comments: Pt sitting up in recliner upon arrival.    Transfers Overall transfer level: Needs assistance Equipment used: Rolling walker (2 wheels) Transfers: Sit to/from Stand Sit to Stand: Mod  assist           General transfer comment: cues for pushing from armrest and to push upright rather than scooting forward     Balance Overall balance assessment: Needs assistance Sitting-balance support: No upper extremity supported, Feet supported Sitting balance-Leahy Scale: Fair     Standing balance support: Bilateral upper extremity supported, During functional activity Standing balance-Leahy Scale: Poor Standing balance comment: Reliant on RW support and min-modA                           ADL either performed or assessed with clinical judgement   ADL Overall ADL's : Needs assistance/impaired             Lower Body Bathing: Moderate assistance;Sit to/from stand Lower Body Bathing Details (indicate cue type and reason): posterior hygiene in standing     Lower Body Dressing: Minimal assistance;Sit to/from stand Lower Body Dressing Details (indicate cue type and reason): assist for clothing mgmt to doff underwear and don pants over waist due to poor standing balance. when pt standing attempting to pull pants up, heavy anterior lean noted w/ hands on assist needed to ensure no fall. able to doff underwear/don pants around feet and up to knees               General ADL Comments: Emphasis on gradual return to activity levels, fall prevention and hands on assist for ADLs    Extremity/Trunk Assessment Upper Extremity Assessment Upper Extremity Assessment: Generalized weakness   Lower Extremity Assessment Lower Extremity Assessment: Defer to PT evaluation        Vision   Vision  Assessment?: No apparent visual deficits   Perception     Praxis      Cognition Arousal/Alertness: Awake/alert Behavior During Therapy: Flat affect Overall Cognitive Status: Impaired/Different from baseline Area of Impairment: Attention, Following commands, Awareness, Problem solving, Safety/judgement                   Current Attention Level: Selective    Following Commands: Follows one step commands with increased time Safety/Judgement: Decreased awareness of safety, Decreased awareness of deficits Awareness: Emergent Problem Solving: Slow processing, Decreased initiation, Difficulty sequencing, Requires verbal cues, Requires tactile cues General Comments: slow to respond, able to recall PT education from this AM to have close chair follow w/ gait attempts. still some decreased insight into deficits as pt adamant on going home though at high risk for falls        Exercises      Shoulder Instructions       General Comments educated pt to use RW not rollator at d/c and have someone hold onto gait belt (provided) around her with all standing mobility. Educated her she needs +2 to try to ambulate, 1 holding her and 1 following closely with w/c. She verbalized understanding and repeated info correctly back to PT to demonstrate understanding and recall.    Pertinent Vitals/ Pain       Pain Assessment Pain Assessment: Faces Faces Pain Scale: Hurts a little bit Pain Location: chronic back pain Pain Descriptors / Indicators: Grimacing, Guarding, Discomfort Pain Intervention(s): Monitored during session  Home Living                                          Prior Functioning/Environment              Frequency  Min 2X/week        Progress Toward Goals  OT Goals(current goals can now be found in the care plan section)  Progress towards OT goals: Progressing toward goals  Acute Rehab OT Goals Patient Stated Goal: home today OT Goal Formulation: With patient Time For Goal Achievement: 01/08/23 Potential to Achieve Goals: Good ADL Goals Pt Will Perform Grooming: with min guard assist;standing Pt Will Perform Lower Body Dressing: with min assist;sit to/from stand;sitting/lateral leans Pt Will Transfer to Toilet: with min guard assist;stand pivot transfer;bedside commode Pt/caregiver will Perform Home Exercise  Program: Increased strength;Both right and left upper extremity;With theraband;With written HEP provided;With Supervision  Plan Discharge plan remains appropriate    Co-evaluation                 AM-PAC OT "6 Clicks" Daily Activity     Outcome Measure   Help from another person eating meals?: A Little Help from another person taking care of personal grooming?: A Little Help from another person toileting, which includes using toliet, bedpan, or urinal?: A Lot Help from another person bathing (including washing, rinsing, drying)?: A Lot Help from another person to put on and taking off regular upper body clothing?: A Little Help from another person to put on and taking off regular lower body clothing?: A Lot 6 Click Score: 15    End of Session Equipment Utilized During Treatment: Rolling walker (2 wheels)  OT Visit Diagnosis: Unsteadiness on feet (R26.81);Other abnormalities of gait and mobility (R26.89);Muscle weakness (generalized) (M62.81);Other symptoms and signs involving cognitive function   Activity Tolerance Patient tolerated treatment well   Patient  Left in chair;with call bell/phone within reach;with chair alarm set   Nurse Communication          Time: 1053-1106 OT Time Calculation (min): 13 min  Charges: OT General Charges $OT Visit: 1 Visit OT Treatments $Self Care/Home Management : 8-22 mins  Bradd Canary, OTR/L Acute Rehab Services Office: [phone]   Lorre Munroe 12/26/2022, 11:22 AM

## 2022-12-26 NOTE — Discharge Instructions (Signed)
Seizure precautions: Per [place] DMV statutes, patients with seizures are not allowed to drive until they have been seizure-free for six months and cleared by a physician    Use caution when using heavy equipment or power tools. Avoid working on ladders or at heights. Take showers instead of baths. Ensure the water temperature is not too high on the home water heater. Do not go swimming alone. Do not lock yourself in a room alone (i.e. bathroom). When caring for infants or small children, sit down when holding, feeding, or changing them to minimize risk of injury to the child in the event you have a seizure. Maintain good sleep hygiene. Avoid alcohol.    If patient has another seizure, call 911 and bring them back to the ED if: A.  The seizure lasts longer than 5 minutes.      B.  The patient doesn't wake shortly after the seizure or has new problems such as difficulty seeing, speaking or moving following the seizure C.  The patient was injured during the seizure D.  The patient has a temperature over 102 F (39C) E.  The patient vomited during the seizure and now is having trouble breathing    During the Seizure   - First, ensure adequate ventilation and place patients on the floor on their left side  Loosen clothing around the neck and ensure the airway is patent. If the patient is clenching the teeth, do not force the mouth open with any object as this can cause severe damage - Remove all items from the surrounding that can be hazardous. The patient may be oblivious to what's happening and may not even know what he or she is doing. If the patient is confused and wandering, either gently guide him/her away and block access to outside areas - Reassure the individual and be comforting - Call 911. In most cases, the seizure ends before EMS arrives. However, there are cases when seizures may last over 3 to 5 minutes. Or the individual may have developed breathing difficulties or severe  injuries. If a pregnant patient or a person with diabetes develops a seizure, it is prudent to call an ambulance. - Finally, if the patient does not regain full consciousness, then call EMS. Most patients will remain confused for about 45 to 90 minutes after a seizure, so you must use judgment in calling for help.      After the Seizure (Postictal Stage)   After a seizure, most patients experience confusion, fatigue, muscle pain and/or a headache. Thus, one should permit the individual to sleep. For the next few days, reassurance is essential. Being calm and helping reorient the person is also of importance.   Most seizures are painless and end spontaneously. Seizures are not harmful to others but can lead to complications such as stress on the lungs, brain and the heart. Individuals with prior lung problems may develop labored breathing and respiratory distress.  

## 2022-12-26 NOTE — TOC Transition Note (Signed)
Transition of Care [facility]) - CM/SW Discharge Note   Patient Details  [name] MRN: [id number] Date of Birth: [birth year]  [facility]) CM/SW Contact:  Kermit Balo, RN Phone Number: 12/26/2022, 9:41 AM   Clinical Narrative:    Pt is discharging home with resumption of home health services through [facility]. Information on the AVS.  Pt has needed DME at home.  Son to provide transportation home today.   Final next level of care: Home w Home Health Services Barriers to Discharge: No Barriers Identified   Patient Goals and CMS Choice CMS Medicare.gov Compare Post Acute Care list provided to:: Patient Choice offered to / list presented to : Patient, Adult Children  Discharge Placement                         Discharge Plan and Services Additional resources added to the After Visit Summary for     Discharge Planning Services: CM Consult                          Date [facility] Agency Contacted: 12/25/22   Representative spoke with at [facility] Agency: Ephriam Knuckles  Social Determinants of Health (SDOH) Interventions SDOH Screenings   Food Insecurity: Food Insecurity Present (12/14/2022)  Transportation Needs: No Transportation Needs (12/08/2022)  Tobacco Use: High Risk (12/19/2022)     Readmission Risk Interventions     No data to display

## 2022-12-26 NOTE — Progress Notes (Addendum)
Phenytoin Follow up Consult Indication: Focal convulsive status epilepticus   Allergies  Allergen Reactions   Nsaids Hives   Tapentadol Swelling, Rash and Other (See Comments)    Nucynta- Made her deathly sick   Cephalexin Other (See Comments)    Reaction not cited   Codeine Other (See Comments)    Reaction not cited   Darvocet [Propoxyphene N-Acetaminophen] Other (See Comments)    Reaction not cited   Latex Other (See Comments)    Reaction not cited   Silicone Other (See Comments)    Reaction not cited   Sulfa Antibiotics Hives   Tape Other (See Comments)    Reaction not cited   Meloxicam Rash    Patient Measurements: Weight: 88.3 kg (194 lb 10.7 oz)   Body mass index is 28.75 kg/m.   Vital signs: Temp: 97.5 F (36.4 C) (05/08 0352) Temp Source: Oral (05/08 0352) BP: 132/88 (05/08 0352) Pulse Rate: 95 (05/08 0352)  Labs: No results found for: "ALBUMIN", "PHENYTOIN", "PHENYTFREE"  Lab Results  Component Value Date   PHENYTOIN 4.4 (L) 12/22/2022   Estimated Creatinine Clearance: 91.9 mL/min (by C-G formula based on SCr of 0.51 mg/dL).    Assessment: Started phenytoin IV 50mg  Q8hr 5/1-5/3 with no load. 5/2 PHT 4.2, corrected = 9.13 and dose increased to IV 75mg  q8hr 5/3- 5/6. Another level 5/4 PHT 4.4, corrected 9.57 but not at steady state after dose increase. Patient ready to switch to PO phenytoin 5/6.  Patient is being discharged before reaching steady state on PO phenytoin. Sent message to patient's PCP to follow up on phenytoin and albumin level. Patient has been asked to see PCP within one week.   Corrected phenytoin level: 5/4 9.57 (not steady state)  Seizure activity: last seizure on EEG 5/5 at 8:08. 5/6 EEG with evidence of arising from right occipital region but no seizure.  Significant potential drug interactions: none  Goals of care:  Total phenytoin level: 10-20 mcg/ml Free phenytoin level: 1-2 mcg/ml  Plan:   -Continue PO phenytoin ER 100  BID -Next phenytoin trough and albumin level at PCP office -Provided patient with letter to give to PCP and educated on importance of adherence and level follow up  -Pharmacy will continue to follow regarding obtaining total phenytoin levels and dose adjustments as indicated.   Alphia Moh, PharmD, BCPS, BCCP Clinical Pharmacist  Please check AMION for all [facility] Pharmacy phone numbers After 10:00 PM, call Main Pharmacy [phone]

## 2022-12-26 NOTE — Progress Notes (Signed)
   Abigail [name]  ZOX:[id number] DOB: [birth year] DOA: 12/18/2022 PCP: Alease Medina, MD    Brief Narrative:  Chart reviewed. Pt was medically cleared for d/c home 12/25/22, and all arrangements for d/c were made by the attending MD at that time. She remained in the hospital an additional night simply to allow the family to prepare for her return home. No further medical issues were encountered following the D/C Summary composed on 12/25/22.   Assessment & Plan: Proceed with D/C home as arranged 12/25/22. No further medical issues requiring exam/attention.   Lonia Blood, MD [facility] Office  [phone] Pager - Text Page per Amion  If 7PM-7AM, please contact night-coverage per Amion 12/26/2022, 9:45 AM

## 2022-12-26 NOTE — Progress Notes (Signed)
Physical Therapy Treatment Patient Details Name: Abigail [name] [facility] MRN: [id number] DOB: Jan 03, 1966 Today's Date: 12/26/2022   History of Present Illness Pt is 57 yo female who presents on 12/07/22 with AMS/ unresponsiveness at home and was given Narcan and was hypoglycemic. Recent admission at [facility] for polypharmacy and hypoglycemia/ hypokalemia. Since admission, full body pain, esp abdominal has been a major issue. 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 Pt with seizures while admitted. PMH: fibromyalgia, chronic opioid use, GERD, RA, CPS, HTN, hypothyroidism, DDD, IBS,visual impairment since 12/23, heavy smoker.    PT Comments    Pt awake and oriented x4 sitting in chair upon arrival. She continues to maintain a flat affect and display slow processing. Pt limited by her chronic back and bil knee pain, limiting her to only standing 2x ~30-50 seconds each bout today. Focused session on transfer training in prep for anticipated d/c home with use of a manual w/c for longer mobility as pt is still unable to progress to gait training. Pt required modA to transfer to stand and minA for stability when performing step pivot transfers with a RW, sliding her feet rather than lifting them. Will continue to follow acutely.      Recommendations for follow up therapy are one component of a multi-disciplinary discharge planning process, led by the attending physician.  Recommendations may be updated based on patient status, additional functional criteria and insurance authorization.  Follow Up Recommendations       Assistance Recommended at Discharge Frequent or constant Supervision/Assistance  Patient can return home with the following Two people to help with walking and/or transfers;Two people to help with bathing/dressing/bathroom;Assistance with cooking/housework;Direct supervision/assist for medications management;Direct supervision/assist for financial management;Help with stairs or  ramp for entrance;Assist for transportation   Equipment Recommendations  None recommended by PT    Recommendations for Other Services       Precautions / Restrictions Precautions Precautions: Fall Restrictions Weight Bearing Restrictions: No     Mobility  Bed Mobility               General bed mobility comments: Pt sitting up in recliner upon arrival.    Transfers Overall transfer level: Needs assistance Equipment used: Rolling walker (2 wheels) Transfers: Sit to/from Stand, Bed to chair/wheelchair/BSC Sit to Stand: Mod assist   Step pivot transfers: Min assist       General transfer comment: Pt cued to scoot to edge of chair and push up from arm rests then grab RW when performing transfers, x2 from recliner to RW with modA to power up and stabilize pt. Pt does scoot very close to the edge but not off it, providing occasional VCs to stop to ensure safety. MinA for stability with step pivot recliner <> chair (did not sit in chair, just got there then went back) 1x. Pt sliding feet and not really clearing them when stepping    Ambulation/Gait               General Gait Details: Pt sliding feet to step pivot between surfaces, not really clearing them to take full steps as cued. Pt declining attempts at further gait training due to back and bil knee pain, only tolerating standing 2x ~30-50 sec.   Stairs             Wheelchair Mobility    Modified Rankin (Stroke Patients Only)       Balance Overall balance assessment: Needs assistance Sitting-balance support: No upper extremity supported, Feet supported  Sitting balance-Leahy Scale: Fair     Standing balance support: Bilateral upper extremity supported, During functional activity Standing balance-Leahy Scale: Poor Standing balance comment: Reliant on RW support and min-modA                            Cognition Arousal/Alertness: Awake/alert Behavior During Therapy: Flat  affect Overall Cognitive Status: Impaired/Different from baseline Area of Impairment: Attention, Following commands, Awareness, Problem solving                   Current Attention Level: Selective   Following Commands: Follows one step commands with increased time, Follows multi-step commands with increased time   Awareness: Emergent Problem Solving: Slow processing, Decreased initiation, Difficulty sequencing, Requires verbal cues, Requires tactile cues General Comments: Pt awake sitting in recliner upon arrival. Pt A&Ox4 but slow to process cues and initiate all mobility, maintaining a flat affect throughout.        Exercises General Exercises - Lower Extremity Long Arc Quad: AROM, Strengthening, Both, 10 reps, Seated Mini-Sqauts: AROM, Strengthening, Both, 5 reps, Standing (with RW and min-modA)    General Comments General comments (skin integrity, edema, etc.): educated pt to use RW not rollator at d/c and have someone hold onto gait belt (provided) around her with all standing mobility. Educated her she needs +2 to try to ambulate, 1 holding her and 1 following closely with w/c. She verbalized understanding and repeated info correctly back to PT to demonstrate understanding and recall.      Pertinent Vitals/Pain Pain Assessment Pain Assessment: Faces Faces Pain Scale: Hurts little more Pain Location: chronic back pain and bil posterior knee pain Pain Descriptors / Indicators: Grimacing, Guarding, Discomfort Pain Intervention(s): Limited activity within patient's tolerance, Monitored during session, Repositioned    Home Living                          Prior Function            PT Goals (current goals can now be found in the care plan section) Acute Rehab PT Goals Patient Stated Goal: return home ASAP PT Goal Formulation: With patient Time For Goal Achievement: 01/08/23 Potential to Achieve Goals: Fair Progress towards PT goals: Progressing toward  goals    Frequency    Min 3X/week      PT Plan Current plan remains appropriate    Co-evaluation              AM-PAC PT "6 Clicks" Mobility   Outcome Measure  Help needed turning from your back to your side while in a flat bed without using bedrails?: A Lot Help needed moving from lying on your back to sitting on the side of a flat bed without using bedrails?: A Lot Help needed moving to and from a bed to a chair (including a wheelchair)?: A Little Help needed standing up from a chair using your arms (e.g., wheelchair or bedside chair)?: A Lot Help needed to walk in hospital room?: Total Help needed climbing 3-5 steps with a railing? : Total 6 Click Score: 11    End of Session Equipment Utilized During Treatment: Gait belt Activity Tolerance: Patient tolerated treatment well;Patient limited by pain Patient left: in chair;with call bell/phone within reach;with chair alarm set   PT Visit Diagnosis: Muscle weakness (generalized) (M62.81);Pain;Difficulty in walking, not elsewhere classified (R26.2);Unsteadiness on feet (R26.81);Other abnormalities of gait and mobility (R26.89) Pain -  Right/Left:  (bil) Pain - part of body: Knee (back)     Time: 1610-9604 PT Time Calculation (min) (ACUTE ONLY): 15 min  Charges:  $Therapeutic Activity: 8-22 mins                     Raymond Gurney, PT, DPT Acute Rehabilitation Services  Office: [phone]    Jewel Baize 12/26/2022, 9:45 AM

## 2022-12-27 LAB — CULTURE, BLOOD (ROUTINE X 2): Special Requests: ADEQUATE

## 2022-12-29 LAB — CULTURE, BLOOD (ROUTINE X 2)
Culture: NO GROWTH
Culture: NO GROWTH

## 2022-12-31 LAB — MISC LABCORP TEST (SEND OUT): Labcorp test code: 9985

## 2023-01-02 LAB — MISC LABCORP TEST (SEND OUT)

## 2023-04-03 ENCOUNTER — Ambulatory Visit: Payer: Medicare HMO | Admitting: Podiatry

## 2023-04-10 ENCOUNTER — Ambulatory Visit (INDEPENDENT_AMBULATORY_CARE_PROVIDER_SITE_OTHER): Payer: Medicare HMO

## 2023-04-10 ENCOUNTER — Encounter: Payer: Self-pay | Admitting: Podiatry

## 2023-04-10 ENCOUNTER — Ambulatory Visit (INDEPENDENT_AMBULATORY_CARE_PROVIDER_SITE_OTHER): Payer: Medicare HMO | Admitting: Podiatry

## 2023-04-10 DIAGNOSIS — M778 Other enthesopathies, not elsewhere classified: Secondary | ICD-10-CM

## 2023-04-10 DIAGNOSIS — M7989 Other specified soft tissue disorders: Secondary | ICD-10-CM | POA: Diagnosis not present

## 2023-04-10 DIAGNOSIS — R0989 Other specified symptoms and signs involving the circulatory and respiratory systems: Secondary | ICD-10-CM

## 2023-04-10 MED ORDER — TRIAMCINOLONE ACETONIDE 40 MG/ML IJ SUSP
20.0000 mg | Freq: Once | INTRAMUSCULAR | Status: AC
Start: 2023-04-10 — End: 2023-04-10
  Administered 2023-04-10: 20 mg

## 2023-04-11 NOTE — Progress Notes (Signed)
Subjective:  Patient ID: Abigail [name] [facility], female    DOB: [birth year],  MRN: [id number] HPI Chief Complaint  Patient presents with   Foot Pain    1st MPJ right - gets red, swollen x months, episodes of pain, numbness in both, thought she could have neuropathy-not diabetic, worse at night time   New Patient (Initial Visit)    Est pt 10/2019    57 y.o. female presents with the above complaint.   ROS: Denies fever chills nausea vomiting muscle aches pains.  States that she was in the hospital for quite some time has been in and out with no answers.  Past Medical History:  Diagnosis Date   Anxiety    Arthritis    Back pain    Basal cell carcinoma 10/04/2021   R axilla - needs excised 11/28/21   Basal cell carcinoma 10/04/2021   L antecubital excised 11/14/21   Diarrhea 11/12/2016   Fibromyalgia    Generalized abdominal pain 11/12/2016   H. pylori infection    Hyperlipidemia    IBS (irritable bowel syndrome)    Infectious colitis 04/29/2016   Migraines    Moderate dehydration 04/29/2016   Muscle pain    Opioid overdose (HCC) 12/07/2022   Reflux    Unexplained weight loss 11/12/2016   Past Surgical History:  Procedure Laterality Date   ABDOMINAL HYSTERECTOMY     APPENDECTOMY  2009   C5 FUSION     C6 FUSION     C7 FUSION     COLONOSCOPY  02/2006   COLONOSCOPY WITH PROPOFOL N/A 12/25/2016   Procedure: COLONOSCOPY WITH PROPOFOL;  Surgeon: Earline Mayotte, MD;  Location: [facility] ENDOSCOPY;  Service: Endoscopy;  Laterality: N/A;   ESOPHAGOGASTRODUODENOSCOPY (EGD) WITH PROPOFOL N/A 12/25/2016   Procedure: ESOPHAGOGASTRODUODENOSCOPY (EGD) WITH PROPOFOL;  Surgeon: Earline Mayotte, MD;  Location: [facility] ENDOSCOPY;  Service: Endoscopy;  Laterality: N/A;   FOOT SURGERY     HIP ARTHROPLASTY     L4 FUSION     L5 FUSION     S1 FUSION      Current Outpatient Medications:    gabapentin (NEURONTIN) 300 MG capsule, Take 300 mg by mouth 2 (two) times daily as needed., Disp: ,  Rfl:    megestrol (MEGACE) 40 MG/ML suspension, Take 200 mg by mouth daily., Disp: , Rfl:    acetaminophen (TYLENOL) 650 MG suppository, Place 1 suppository (650 mg total) rectally every 6 (six) hours as needed for fever., Disp: 12 suppository, Rfl: 0   albuterol (VENTOLIN HFA) 108 (90 Base) MCG/ACT inhaler, Inhale 2 puffs into the lungs every 6 (six) hours as needed for wheezing or shortness of breath., Disp: , Rfl:    BC FAST PAIN RELIEF 845-65 MG PACK, Take 1 packet by mouth 2 (two) times daily as needed (for headaches or pain)., Disp: , Rfl:    DULoxetine (CYMBALTA) 30 MG capsule, Take 1 capsule (30 mg total) by mouth daily., Disp: 90 capsule, Rfl: 1   feeding supplement (ENSURE ENLIVE / ENSURE PLUS) LIQD, Take 237 mLs by mouth 2 (two) times daily between meals., Disp: 237 mL, Rfl: 12   folic acid 1 mg in sodium chloride 0.9 % 50 mL, Inject 1 mg into the vein daily. (Patient not taking: Reported on 12/22/2022), Disp: , Rfl:    hydrOXYzine (ATARAX) 25 MG tablet, Take 1 tablet (25 mg total) by mouth 3 (three) times daily as needed for itching or anxiety., Disp: 30 tablet, Rfl: 0   lacosamide (VIMPAT)  200 MG TABS tablet, Take 1 tablet (200 mg total) by mouth 2 (two) times daily., Disp: 60 tablet, Rfl: 2   levETIRAcetam (KEPPRA) 1000 MG tablet, Take 2 tablets (2,000 mg total) by mouth 2 (two) times daily., Disp: 120 tablet, Rfl: 2   Multiple Vitamin (MULTIVITAMIN WITH MINERALS) TABS tablet, Take 1 tablet by mouth daily., Disp: , Rfl:    Nicotine (NICODERM CQ TD), Place 1 patch onto the skin daily as needed (for smoking cessation- while hospitalized)., Disp: , Rfl:    ondansetron (ZOFRAN) 4 MG/2ML SOLN injection, Inject 2 mLs (4 mg total) into the vein every 6 (six) hours as needed for nausea. (Patient not taking: Reported on 12/22/2022), Disp: 2 mL, Rfl: 0   pantoprazole (PROTONIX) 40 MG tablet, Take 40 mg by mouth 2 (two) times daily before a meal., Disp: , Rfl:    phenytoin (DILANTIN) 100 MG ER capsule,  Take 1 capsule (100 mg total) by mouth 2 (two) times daily., Disp: 60 capsule, Rfl: 2   thiamine (VITAMIN B1) 100 MG tablet, Take 1 tablet (100 mg total) by mouth daily., Disp: 30 tablet, Rfl: 2  Allergies  Allergen Reactions   Nsaids Hives   Tapentadol Swelling, Rash and Other (See Comments)    Nucynta- Made her deathly sick   Cephalexin Other (See Comments)    Reaction not cited   Codeine Other (See Comments)    Reaction not cited   Darvocet [Propoxyphene N-Acetaminophen] Other (See Comments)    Reaction not cited   Latex Other (See Comments)    Reaction not cited   Silicone Other (See Comments)    Reaction not cited   Sulfa Antibiotics Hives   Tape Other (See Comments)    Reaction not cited   Meloxicam Rash   Review of Systems Objective:  There were no vitals filed for this visit.  General: Well developed, nourished, in no acute distress, alert and oriented x3   Dermatological: Skin is warm, dry and supple bilateral. Nails x 10 are well maintained; remaining integument appears unremarkable at this time. There are no open sores, no preulcerative lesions, no rash or signs of infection present.  Vascular: Dorsalis Pedis artery and Posterior Tibial artery pedal pulses are 0/4 right and 2/4 left  with immedate capillary fill time. Pedal hair growth present.  Multiple varicosities and considerable lower extremity edema present bilateral.   Neruologic: Grossly intact via light touch bilateral. Vibratory intact via tuning fork bilateral. Protective threshold with Semmes Wienstein monofilament intact to all pedal sites bilateral. Patellar and Achilles deep tendon reflexes 2+ bilateral. No Babinski or clonus noted bilateral.   Musculoskeletal: No gross boney pedal deformities bilateral. No pain, crepitus, or limitation noted with foot and ankle range of motion bilateral. Muscular strength 5/5 in all groups tested bilateral.  Mild hallux abductovalgus deformity of the right foot moderate  erythema no cellulitis drainage or odor no open lesions or wounds  Gait: Unassisted, Nonantalgic.    Radiographs:  Pending  Assessment & Plan:   Assessment: Peripheral vascular disease decreased pulses to the right lower extremity edema bilateral lower extremity.  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    Plan: We are sending her for vascular evaluation with ABIs and venous duplex and I injected around the joint today 20 mg Kenalog 5 mg Marcaine first metatarsophalangeal joint right.     Marlet Korte T. [place], [place]

## 2023-04-30 ENCOUNTER — Ambulatory Visit (HOSPITAL_COMMUNITY)
Admission: RE | Admit: 2023-04-30 | Payer: Medicare HMO | Source: Ambulatory Visit | Attending: Podiatry | Admitting: Podiatry

## 2023-05-22 ENCOUNTER — Ambulatory Visit (HOSPITAL_COMMUNITY)
Admission: RE | Admit: 2023-05-22 | Discharge: 2023-05-22 | Disposition: A | Discharge status: Home / Self Care | Payer: Medicare HMO | Source: Ambulatory Visit | Attending: Cardiology | Admitting: Cardiology

## 2023-05-22 DIAGNOSIS — M7989 Other specified soft tissue disorders: Secondary | ICD-10-CM | POA: Diagnosis present

## 2023-05-22 DIAGNOSIS — R0989 Other specified symptoms and signs involving the circulatory and respiratory systems: Secondary | ICD-10-CM | POA: Insufficient documentation

## 2023-05-29 DIAGNOSIS — M8580 Other specified disorders of bone density and structure, unspecified site: Secondary | ICD-10-CM | POA: Insufficient documentation

## 2023-06-03 ENCOUNTER — Ambulatory Visit (INDEPENDENT_AMBULATORY_CARE_PROVIDER_SITE_OTHER): Payer: Medicare HMO | Admitting: Vascular Surgery

## 2023-06-03 ENCOUNTER — Encounter (INDEPENDENT_AMBULATORY_CARE_PROVIDER_SITE_OTHER): Payer: Self-pay | Admitting: Vascular Surgery

## 2023-06-03 ENCOUNTER — Ambulatory Visit (INDEPENDENT_AMBULATORY_CARE_PROVIDER_SITE_OTHER): Payer: Medicare HMO

## 2023-06-03 VITALS — BP 107/75 | HR 89 | Resp 18 | Ht 67.0 in | Wt 167.6 lb

## 2023-06-03 DIAGNOSIS — K219 Gastro-esophageal reflux disease without esophagitis: Secondary | ICD-10-CM

## 2023-06-03 DIAGNOSIS — M5030 Other cervical disc degeneration, unspecified cervical region: Secondary | ICD-10-CM | POA: Diagnosis not present

## 2023-06-03 DIAGNOSIS — J4520 Mild intermittent asthma, uncomplicated: Secondary | ICD-10-CM

## 2023-06-03 DIAGNOSIS — I739 Peripheral vascular disease, unspecified: Secondary | ICD-10-CM | POA: Diagnosis not present

## 2023-06-03 DIAGNOSIS — R0989 Other specified symptoms and signs involving the circulatory and respiratory systems: Secondary | ICD-10-CM

## 2023-06-03 NOTE — Progress Notes (Signed)
MRN : [id number]  [facility] Schaak is a 57 y.o. (1966/07/21) female who presents with chief complaint of check circulation.  History of Present Illness:   The patient is seen for evaluation of painful lower extremities and diminished pulses. Patient notes the pain is minimal and not lifestyle limiting.   The patient denies rest pain or dangling of an extremity off the side of the bed during the night for relief. No open wounds or sores at this time. No prior interventions or surgeries.  No history of back problems or DJD of the lumbar sacral spine.   The patient's blood pressure has been stable and relatively well controlled. The patient denies amaurosis fugax or recent TIA symptoms. There are no recent neurological changes noted. The patient denies history of DVT, PE or superficial thrombophlebitis. The patient denies recent episodes of angina or shortness of breath.   ABIs are normal bilaterally  No outpatient medications have been marked as taking for the 06/03/23 encounter (Appointment) with Gilda Crease, Latina Craver, MD.    Past Medical History:  Diagnosis Date   Anxiety    Arthritis    Back pain    Basal cell carcinoma 10/04/2021   R axilla - needs excised 11/28/21   Basal cell carcinoma 10/04/2021   L antecubital excised 11/14/21   Diarrhea 11/12/2016   Fibromyalgia    Generalized abdominal pain 11/12/2016   H. pylori infection    Hyperlipidemia    IBS (irritable bowel syndrome)    Infectious colitis 04/29/2016   Migraines    Moderate dehydration 04/29/2016   Muscle pain    Opioid overdose (HCC) 12/07/2022   Reflux    Unexplained weight loss 11/12/2016    Past Surgical History:  Procedure Laterality Date   ABDOMINAL HYSTERECTOMY     APPENDECTOMY  2009   C5 FUSION     C6 FUSION     C7 FUSION     COLONOSCOPY  02/2006   COLONOSCOPY WITH PROPOFOL N/A 12/25/2016   Procedure: COLONOSCOPY  WITH PROPOFOL;  Surgeon: Earline Mayotte, MD;  Location: [facility] ENDOSCOPY;  Service: Endoscopy;  Laterality: N/A;   ESOPHAGOGASTRODUODENOSCOPY (EGD) WITH PROPOFOL N/A 12/25/2016   Procedure: ESOPHAGOGASTRODUODENOSCOPY (EGD) WITH PROPOFOL;  Surgeon: Earline Mayotte, MD;  Location: [facility] ENDOSCOPY;  Service: Endoscopy;  Laterality: N/A;   FOOT SURGERY     HIP ARTHROPLASTY     L4 FUSION     L5 FUSION     S1 FUSION      Social History Social History   Tobacco Use   Smoking status: Every Day    Current packs/day: 1.00    Types: Cigarettes   Smokeless tobacco: Never   Tobacco comments:    ELECTRONIC VAPOR  Substance Use Topics   Alcohol use: Yes    Comment: OCCASIONALLY   Drug use: No    Family History No family history on file.  Allergies  Allergen Reactions   Nsaids Hives   Tapentadol Swelling, Rash and Other (See Comments)    Nucynta- Made her deathly sick   Cephalexin Other (See Comments)  Reaction not cited   Codeine Other (See Comments)    Reaction not cited   Darvocet [Propoxyphene N-Acetaminophen] Other (See Comments)    Reaction not cited   Latex Other (See Comments)    Reaction not cited   Silicone Other (See Comments)    Reaction not cited   Sulfa Antibiotics Hives   Tape Other (See Comments)    Reaction not cited   Meloxicam Rash     REVIEW OF SYSTEMS (Negative unless checked)  Constitutional: [] Weight loss  [] Fever  [] Chills Cardiac: [] Chest pain   [] Chest pressure   [] Palpitations   [] Shortness of breath when laying flat   [] Shortness of breath with exertion. Vascular:  [x] Pain in legs with walking   [] Pain in legs at rest  [] History of DVT   [] Phlebitis   [] Swelling in legs   [] Varicose veins   [] Non-healing ulcers Pulmonary:   [] Uses home oxygen   [] Productive cough   [] Hemoptysis   [] Wheeze  [] COPD   [] Asthma Neurologic:  [] Dizziness   [] Seizures   [] History of stroke   [] History of TIA  [] Aphasia   [] Vissual changes   [] Weakness or numbness in  arm   [] Weakness or numbness in leg Musculoskeletal:   [] Joint swelling   [] Joint pain   [] Low back pain Hematologic:  [] Easy bruising  [] Easy bleeding   [] Hypercoagulable state   [] Anemic Gastrointestinal:  [] Diarrhea   [] Vomiting  [x] Gastroesophageal reflux/heartburn   [] Difficulty swallowing. Genitourinary:  [] Chronic kidney disease   [] Difficult urination  [] Frequent urination   [] Blood in urine Skin:  [] Rashes   [] Ulcers  Psychological:  [] History of anxiety   []  History of major depression.  Physical Examination  There were no vitals filed for this visit. There is no height or weight on file to calculate BMI. Gen: WD/WN, NAD Head: [place]/AT, No temporalis wasting.  Ear/Nose/Throat: Hearing grossly intact, nares w/o erythema or drainage Eyes: PER, EOMI, sclera nonicteric.  Neck: Supple, no masses.  No bruit or JVD.  Pulmonary:  Good air movement, no audible wheezing, no use of accessory muscles.  Cardiac: RRR, normal S1, S2, no Murmurs. Vascular:  mild trophic changes, no open wounds Vessel Right Left  Radial Palpable Palpable  PT Palpable Palpable  DP Palpable  Palpable  Gastrointestinal: soft, non-distended. No guarding/no peritoneal signs.  Musculoskeletal: M/S 5/5 throughout.  No visible deformity.  Neurologic: CN 2-12 intact. Pain and light touch intact in extremities.  Symmetrical.  Speech is fluent. Motor exam as listed above. Psychiatric: Judgment intact, Mood & affect appropriate for pt's clinical situation. Dermatologic: No rashes or ulcers noted.  No changes consistent with cellulitis.   CBC Lab Results  Component Value Date   WBC 15.4 (H) 12/25/2022   HGB 11.3 (L) 12/25/2022   HCT 34.5 (L) 12/25/2022   MCV 89.6 12/25/2022   PLT 347 12/25/2022    BMET    Component Value Date/Time   NA 136 12/24/2022 0345   NA 141 05/24/2014 1903   K 3.8 12/24/2022 0345   K 3.8 05/24/2014 1903   CL 97 (L) 12/24/2022 0345   CL 106 05/24/2014 1903   CO2 31 12/24/2022 0345    CO2 31 05/24/2014 1903   GLUCOSE 184 (H) 12/24/2022 0345   GLUCOSE 100 (H) 05/24/2014 1903   BUN <5 (L) 12/24/2022 0345   BUN 7 05/24/2014 1903   CREATININE 0.51 12/24/2022 0345   CREATININE 0.80 05/24/2014 1903   CALCIUM 8.4 (L) 12/24/2022 0345   CALCIUM 8.9 05/24/2014 1903  GFRNONAA >60 12/24/2022 0345   GFRNONAA >60 05/24/2014 1903   GFRNONAA >60 10/13/2013 1143   GFRAA >60 01/15/2015 1300   GFRAA >60 05/24/2014 1903   GFRAA >60 10/13/2013 1143   CrCl cannot be calculated (Patient's most recent lab result is older than the maximum 21 days allowed.).  COAG No results found for: "INR", "PROTIME"  Radiology VAS [place] LOWER EXTREMITY VENOUS REFLUX  Result Date: 05/24/2023  Lower Venous Reflux Study Patient Name:  AARON BRISON [facility]  Date of Exam:   05/22/2023 Medical Rec #: [id number]                  Accession #:    [id number] Date of Birth: 05-29-1966                  Patient Gender: F Patient Age:   84 years Exam Location:  [facility] Procedure:      VAS [place] LOWER EXTREMITY VENOUS REFLUX Referring Phys: MAX HYATT --------------------------------------------------------------------------------  Indications: Bilateral lower extremity swelling, right worse than left.  Risk Factors: None identified. Comparison Study: NA Performing Technologist: Tyna Jaksch RVT  Examination Guidelines: A complete evaluation includes B-mode imaging, spectral Doppler, color Doppler, and power Doppler as needed of all accessible portions of each vessel. Bilateral testing is considered an integral part of a complete examination. Limited examinations for reoccurring indications may be performed as noted. The reflux portion of the exam is performed with the patient in reverse Trendelenburg. Significant venous reflux is defined as >500 ms in the superficial venous system, and >1 second in the deep venous system.  Venous Reflux Times +--------------+---------+------+-----------+------------+--------+ RIGHT          Reflux NoRefluxReflux TimeDiameter cmsComments                         Yes                                  +--------------+---------+------+-----------+------------+--------+ CFV           no                                             +--------------+---------+------+-----------+------------+--------+ FV prox       no                                             +--------------+---------+------+-----------+------------+--------+ FV mid        no                                             +--------------+---------+------+-----------+------------+--------+ FV dist       no                                             +--------------+---------+------+-----------+------------+--------+ Popliteal     no                                             +--------------+---------+------+-----------+------------+--------+  GSV at [facility]    no                            .50              +--------------+---------+------+-----------+------------+--------+ GSV prox thighno                            .49              +--------------+---------+------+-----------+------------+--------+ GSV mid thigh           yes    >500 ms      .40              +--------------+---------+------+-----------+------------+--------+ GSV dist thighno                            .36              +--------------+---------+------+-----------+------------+--------+ GSV at knee   no                            .36              +--------------+---------+------+-----------+------------+--------+ GSV prox calf no                            .34              +--------------+---------+------+-----------+------------+--------+ GSV mid calf  no                            .21              +--------------+---------+------+-----------+------------+--------+ GSV dist calf           yes    >500 ms      .15               +--------------+---------+------+-----------+------------+--------+ SSV [place] no                            .32              +--------------+---------+------+-----------+------------+--------+ SSV prox calf no                            .24              +--------------+---------+------+-----------+------------+--------+ SSV mid calf  no                            .20              +--------------+---------+------+-----------+------------+--------+  Summary: Right: - No evidence of deep vein thrombosis seen in the right lower extremity, from the common femoral through the popliteal veins. - No evidence of superficial venous thrombosis in the right lower extremity. - No evidence of superficial venous reflux seen in the right short saphenous vein. - Venous reflux is noted in the right greater saphenous vein in the thigh. - Venous reflux is noted in the right greater saphenous vein in the calf.  *See table(s) above for measurements and observations. Electronically signed by Charlton Haws MD on 05/24/2023 at 12:03:12 PM.    Final  Assessment/Plan 1. PAD (peripheral artery disease) (HCC) Recommend:  I do not find evidence of life style limiting vascular disease. The patient specifically denies life style limitation.  Previous noninvasive studies including ABI's of the legs do not identify critical vascular problems.  The patient should continue walking and begin a more formal exercise program. The patient should continue his antiplatelet therapy and aggressive treatment of the lipid abnormalities.  The patient is instructed to call the office if there is a significant change in the lower extremity symptoms, particularly if a wound develops or there is an abrupt increase in leg pain.  Patient will follow-up with me on a PRN basis  2. Gastroesophageal reflux disease, unspecified whether esophagitis present Continue PPI as already ordered, this medication has been reviewed and  there are no changes at this time.  Avoidence of caffeine and alcohol  Moderate elevation of the head of the bed   3. Mild intermittent asthma, unspecified whether complicated Continue pulmonary medications and aerosols as already ordered, these medications have been reviewed and there are no changes at this time.   4. DDD (degenerative disc disease), cervical Continue medications to treat the patient's degenerative disease as already ordered, these medications have been reviewed and there are no changes at this time.  Continued activity and therapy was stressed.    Levora Dredge, MD  06/03/2023 1:21 PM

## 2023-06-06 LAB — VAS US ABI WITH/WO TBI
Left ABI: 1.18
Right ABI: 1.16

## 2023-06-09 ENCOUNTER — Encounter (INDEPENDENT_AMBULATORY_CARE_PROVIDER_SITE_OTHER): Payer: Self-pay | Admitting: Vascular Surgery

## 2023-06-09 DIAGNOSIS — I739 Peripheral vascular disease, unspecified: Secondary | ICD-10-CM | POA: Insufficient documentation

## 2023-06-27 ENCOUNTER — Other Ambulatory Visit: Payer: Self-pay

## 2023-06-27 ENCOUNTER — Emergency Department
Admission: EM | Admit: 2023-06-27 | Discharge: 2023-06-28 | Disposition: A | Discharge status: Other Acute Inpatient Hospital | Payer: Medicare HMO | Attending: Emergency Medicine | Admitting: Emergency Medicine

## 2023-06-27 ENCOUNTER — Encounter (HOSPITAL_COMMUNITY): Payer: Self-pay

## 2023-06-27 ENCOUNTER — Emergency Department: Payer: Medicare HMO

## 2023-06-27 ENCOUNTER — Encounter: Payer: Self-pay | Admitting: Emergency Medicine

## 2023-06-27 DIAGNOSIS — G9340 Encephalopathy, unspecified: Secondary | ICD-10-CM | POA: Diagnosis not present

## 2023-06-27 DIAGNOSIS — R4182 Altered mental status, unspecified: Secondary | ICD-10-CM | POA: Diagnosis present

## 2023-06-27 LAB — CBC WITH DIFFERENTIAL/PLATELET
Abs Immature Granulocytes: 0.07 10*3/uL (ref 0.00–0.07)
Basophils Absolute: 0.1 10*3/uL (ref 0.0–0.1)
Basophils Relative: 0 %
Eosinophils Absolute: 0 10*3/uL (ref 0.0–0.5)
Eosinophils Relative: 0 %
HCT: 38 % (ref 36.0–46.0)
Hemoglobin: 12.4 g/dL (ref 12.0–15.0)
Immature Granulocytes: 1 %
Lymphocytes Relative: 17 %
Lymphs Abs: 2.2 10*3/uL (ref 0.7–4.0)
MCH: 29.9 pg (ref 26.0–34.0)
MCHC: 32.6 g/dL (ref 30.0–36.0)
MCV: 91.6 fL (ref 80.0–100.0)
Monocytes Absolute: 1.4 10*3/uL — ABNORMAL HIGH (ref 0.1–1.0)
Monocytes Relative: 11 %
Neutro Abs: 9.2 10*3/uL — ABNORMAL HIGH (ref 1.7–7.7)
Neutrophils Relative %: 71 %
Platelets: 383 10*3/uL (ref 150–400)
RBC: 4.15 MIL/uL (ref 3.87–5.11)
RDW: 15.1 % (ref 11.5–15.5)
WBC: 12.8 10*3/uL — ABNORMAL HIGH (ref 4.0–10.5)
nRBC: 0 % (ref 0.0–0.2)

## 2023-06-27 LAB — BLOOD GAS, VENOUS
Acid-Base Excess: 21.8 mmol/L — ABNORMAL HIGH (ref 0.0–2.0)
Bicarbonate: 47.9 mmol/L — ABNORMAL HIGH (ref 20.0–28.0)
O2 Saturation: 45.7 %
Patient temperature: 37
pCO2, Ven: 56 mm[Hg] (ref 44–60)
pH, Ven: 7.54 — ABNORMAL HIGH (ref 7.25–7.43)
pO2, Ven: <31 mm[Hg] — CL (ref 32–45)

## 2023-06-27 LAB — COMPREHENSIVE METABOLIC PANEL
ALT: 11 U/L (ref 0–44)
AST: 22 U/L (ref 15–41)
Albumin: 2.4 g/dL — ABNORMAL LOW (ref 3.5–5.0)
Alkaline Phosphatase: 134 U/L — ABNORMAL HIGH (ref 38–126)
Anion gap: 12 (ref 5–15)
BUN: 6 mg/dL (ref 6–20)
CO2: 36 mmol/L — ABNORMAL HIGH (ref 22–32)
Calcium: 8.1 mg/dL — ABNORMAL LOW (ref 8.9–10.3)
Chloride: 88 mmol/L — ABNORMAL LOW (ref 98–111)
Creatinine, Ser: 0.64 mg/dL (ref 0.44–1.00)
GFR, Estimated: >60 mL/min (ref >60)
Glucose, Bld: 108 mg/dL — ABNORMAL HIGH (ref 70–99)
Potassium: 3 mmol/L — ABNORMAL LOW (ref 3.5–5.1)
Sodium: 136 mmol/L (ref 135–145)
Total Bilirubin: 0.6 mg/dL (ref <1.2)
Total Protein: 7.2 g/dL (ref 6.5–8.1)

## 2023-06-27 LAB — LIPASE, BLOOD: Lipase: 57 U/L — ABNORMAL HIGH (ref 11–51)

## 2023-06-27 LAB — PHENYTOIN LEVEL, TOTAL: Phenytoin Lvl: <2.5 ug/mL — ABNORMAL LOW (ref 10.0–20.0)

## 2023-06-27 LAB — MAGNESIUM: Magnesium: 2 mg/dL (ref 1.7–2.4)

## 2023-06-27 MED ORDER — LEVETIRACETAM IN NACL 1000 MG/100ML IV SOLN
1000.0000 mg | Freq: Once | INTRAVENOUS | Status: DC
  Filled 2023-06-27: qty 100

## 2023-06-27 MED ORDER — SODIUM CHLORIDE 0.9 % IV SOLN: 2000.0000 mg | Freq: Once | INTRAVENOUS | Status: DC

## 2023-06-27 MED ORDER — POTASSIUM CHLORIDE 10 MEQ/100ML IV SOLN
10.0000 meq | INTRAVENOUS | Status: DC
  Administered 2023-06-27 – 2023-06-28 (×2): 10 meq via INTRAVENOUS
  Filled 2023-06-27 (×3): qty 100

## 2023-06-27 MED ORDER — SODIUM CHLORIDE 0.9 % IV SOLN
1000.0000 mg | Freq: Once | INTRAVENOUS | Status: DC
  Filled 2023-06-27: qty 20

## 2023-06-27 MED ORDER — SODIUM CHLORIDE 0.9 % IV SOLN
200.0000 mg | Freq: Once | INTRAVENOUS | Status: DC
  Filled 2023-06-27: qty 20

## 2023-06-27 MED ORDER — LORAZEPAM 2 MG/ML IJ SOLN
1.0000 mg | Freq: Once | INTRAMUSCULAR | Status: AC
  Administered 2023-06-27: 1 mg via INTRAVENOUS
  Filled 2023-06-27: qty 1

## 2023-06-27 MED ORDER — LEVETIRACETAM IN NACL 1000 MG/100ML IV SOLN
1000.0000 mg | Freq: Once | INTRAVENOUS | Status: AC
  Administered 2023-06-27: 1000 mg via INTRAVENOUS
  Filled 2023-06-27: qty 100

## 2023-06-27 MED ORDER — LEVETIRACETAM IN NACL 1500 MG/100ML IV SOLN
1500.0000 mg | Freq: Once | INTRAVENOUS | Status: AC
  Administered 2023-06-28: 1500 mg via INTRAVENOUS
  Filled 2023-06-27: qty 100

## 2023-06-27 NOTE — Plan of Care (Signed)
Brief Neuro Note:  Briefly, Ms. Glendia Olshefski [facility] is a 6 F hx of epilepsy, RA, giant cell arteritis, prescription abuse p/w AMS throughout the day. Back in may had similar presentation and found to have subclinical seizures/status. A month ago, stopped taking all of her AEDs. This AM, was on ground this AM, got to couch and more confused. She hurts everywhere. Protecting her airway. Got Keppra 2000mg , Lacosamide 200mg .  She could very well have suclinical seizures, specially with her hx of such and the fact that she stopped taking AEDs. She got loaded with Keppra and Vimpat but there is hard to establish an end point clinically in the setting of subclinical seizures.  She is protecting her airway and is thrashing around. I would recommend transferring her to [facility] for cEEG. She can be in a progressive bed at this time or if there are no beds available, she should come ED to ED.  Discussed with Dr. Erma Heritage with the ED team and would also recommend using Ativan 1-2mg  PRN for any clincial seizure lasting more than 2 mins.  Erick Blinks [facility]

## 2023-06-27 NOTE — ED Notes (Signed)
Called [facility] @ 2332 spoke to Rep: Orlena Sheldon waiting on call back for provider consultation

## 2023-06-27 NOTE — ED Triage Notes (Addendum)
Patient presents to ED via [facility] from home for altered mental status.  Per EMS she also has multiple focal seizures daily.  During triage, pt yelling "please help, oh god, please help" and thrashing about in bed but unable to state what she wants or needs

## 2023-06-27 NOTE — ED Provider Notes (Signed)
[facility] Provider Note    Event Date/Time   First MD Initiated Contact with Patient 06/27/23 2203     (approximate)   History   Seizures and Altered Mental Status   HPI  Abigail [name] is a 57 y.o. female   with complex past medical history including rheumatoid arthritis, giant cell arteritis, epilepsy, here with altered mental status.  History initially somewhat limited.  On son's arrival, additional history obtained.  Per report, the patient was found laying down in her living room today.  She did not seem to fall but she was slightly confused.  Throughout the day, she has been intermittently confused and less responsive.  She has done this before when she has been having seizures.  She reportedly stopped all seizure medications about a month ago.  She is also not had her oxycodone today and has been complaining of her chronic pain.  Son does not recall any recent illnesses.  No other complaints.  History otherwise limited due to altered mental status.      Physical Exam   Triage Vital Signs: ED Triage Vitals  Encounter Vitals Group     BP 06/27/23 2200 138/64     Systolic BP Percentile --      Diastolic BP Percentile --      Pulse Rate 06/27/23 2200 75     Resp --      Temp --      Temp Source 06/27/23 2200 Oral     SpO2 06/27/23 2200 95 %     Weight --      Height --      Head Circumference --      Peak Flow --      Pain Score 06/27/23 2201 0     Pain Loc --      Pain Education --      Exclude from Growth Chart --     Most recent vital signs: Vitals:   06/28/23 0057 06/28/23 0100  BP: 127/61 109/87  Pulse:  90  Resp:  15  Temp:  98.4 F (36.9 C)  SpO2:  97%     General: Confused, but protecting airway. CV:  Good peripheral perfusion. Regular rate and rhythm. Resp:  Normal work of breathing. Lungs clear bilaterally. Abd:  No distention. No tenderness. Other:  Answers questions and follows commands, GCS 14.  Intermittent "jerks" of bl Ue and LE but no rhythm or coordination and these seem more myoclonic.  No generalized seizure activity.  She answers questions and follows commands appropriately.  Face is symmetric.  No peripheral nerve deficits.  Normal sensation to light touch but upper and lower extremities.   ED Results / Procedures / Treatments   Labs (all labs ordered are listed, but only abnormal results are displayed) Labs Reviewed  PHENYTOIN LEVEL, TOTAL - Abnormal; Notable for the following components:      Result Value   Phenytoin Lvl <2.5 (*)    All other components within normal limits  CBC WITH DIFFERENTIAL/PLATELET - Abnormal; Notable for the following components:   WBC 12.8 (*)    Neutro Abs 9.2 (*)    Monocytes Absolute 1.4 (*)    All other components within normal limits  COMPREHENSIVE METABOLIC PANEL - Abnormal; Notable for the following components:   Potassium 3.0 (*)    Chloride 88 (*)    CO2 36 (*)    Glucose, Bld 108 (*)    Calcium 8.1 (*)  Albumin 2.4 (*)    Alkaline Phosphatase 134 (*)    All other components within normal limits  BLOOD GAS, VENOUS - Abnormal; Notable for the following components:   pH, Ven 7.54 (*)    pO2, Ven <31 (*)    Bicarbonate 47.9 (*)    Acid-Base Excess 21.8 (*)    All other components within normal limits  LIPASE, BLOOD - Abnormal; Notable for the following components:   Lipase 57 (*)    All other components within normal limits  MAGNESIUM  LACOSAMIDE  LEVETIRACETAM LEVEL     EKG Normal sinus rhythm, Triklo rate 80.  PR 161, QRS 104, QTc 512.  No acute ST elevations or depressions.  No acute events of acute ischemia or infarct.   RADIOLOGY CT head: No acute intracranial normality Chest x-ray: Clear   I also independently reviewed and agree with radiologist interpretations.   PROCEDURES:  Critical Care performed: Yes, see critical care procedure note(s)  .Critical Care  Performed by: Shaune Pollack,  MD Authorized by: Shaune Pollack, MD   Critical care provider statement:    Critical care time (minutes):  30   Critical care time was exclusive of:  Separately billable procedures and treating other patients   Critical care was necessary to treat or prevent imminent or life-threatening deterioration of the following conditions:  Cardiac failure, circulatory failure, respiratory failure and CNS failure or compromise   Critical care was time spent personally by me on the following activities:  Development of treatment plan with patient or surrogate, discussions with consultants, evaluation of patient's response to treatment, examination of patient, ordering and review of laboratory studies, ordering and review of radiographic studies, ordering and performing treatments and interventions, pulse oximetry, re-evaluation of patient's condition and review of old charts     MEDICATIONS ORDERED IN ED: Medications  LORazepam (ATIVAN) injection 1 mg (1 mg Intravenous Given 06/27/23 2332)  levETIRAcetam (KEPPRA) IVPB 1000 mg/100 mL premix (0 mg Intravenous Stopped 06/27/23 2359)    Followed by  levETIRAcetam (KEPPRA) IVPB 1000 mg/100 mL premix (0 mg Intravenous Stopped 06/28/23 0124)  levETIRAcetam (KEPPRA) IVPB 1500 mg/ 100 mL premix (1,500 mg Intravenous Transfusing/Transfer 06/28/23 0125)  LORazepam (ATIVAN) injection 1 mg (1 mg Intravenous Given 06/28/23 0125)     IMPRESSION / MDM / ASSESSMENT AND PLAN / ED COURSE  I reviewed the triage vital signs and the nursing notes.                              Differential diagnosis includes, but is not limited to, status epilepticus, acute metabolic encephalopathy, encephalopathy secondary to UTI, pneumonia, or other occult infection, polysubstance use, hypercapnia  Patient's presentation is most consistent with acute presentation with potential threat to life or bodily function.  The patient is on the cardiac monitor to evaluate for evidence of arrhythmia  and/or significant heart rate changes   57 year old female with complex past medical history including history of epilepsy here with altered mental status.  Clinically, suspect acute encephalopathy secondary to likely recurrent partial seizures.  I reviewed her previous EEG records and this shows multiple seizures without generalized activity.  She reportedly has been off her medications for a month.  Will give her IV loads of her Keppra, Vimpat, as well as phenytoin.  Discussed the case with neurology at [facility] who agrees with transfer.  She will be admitted to the stepdown, transfer ED to ED if no beds  immediately available.  Otherwise, she has no apparent infectious trigger.  VBG shows no evidence of hypercapnia or retention.  She has mild hypokalemia which we we will replete.  Mild leukocytosis is likely reactive.  No focal deficits to suggest CVA.  CT head is negative.  Chest x-ray is clear.   FINAL CLINICAL IMPRESSION(S) / ED DIAGNOSES   Final diagnoses:  Acute encephalopathy     Rx / DC Orders   ED Discharge Orders     None        Note:  This document was prepared using Dragon voice recognition software and may include unintentional dictation errors.   Shaune Pollack, MD 06/28/23 [phone]

## 2023-06-27 NOTE — ED Notes (Signed)
RN notified by registration that pt is attempting to pull off [place] and pulse ox and climb out of bed. RN aware. Son at bedside helping to keep pt in bed.

## 2023-06-27 NOTE — ED Notes (Addendum)
Attempting to draw blood from pt's IV. Pt writhing around in bed and attempting to get out of bed. This RN redirecting patient to stay in bed. Attempted to put [place] back on patient as well, but pt continuing to pull it off.    While completing pt med list, son notified this RN that he has been giving his mom furosemide (unknown dose) for a couple days due to concerns for fluid overload. This RN informed son med should not be given without dr prescription

## 2023-06-28 ENCOUNTER — Encounter (HOSPITAL_COMMUNITY): Payer: Self-pay | Admitting: Internal Medicine

## 2023-06-28 ENCOUNTER — Inpatient Hospital Stay (HOSPITAL_COMMUNITY)
Admission: EM | Admit: 2023-06-28 | Discharge: 2023-07-04 | DRG: 100 | Disposition: A | Discharge status: Home / Self Care | Payer: Medicare HMO | Attending: Internal Medicine | Admitting: Internal Medicine

## 2023-06-28 ENCOUNTER — Inpatient Hospital Stay (HOSPITAL_COMMUNITY): Payer: Medicare HMO

## 2023-06-28 DIAGNOSIS — E876 Hypokalemia: Secondary | ICD-10-CM | POA: Diagnosis present

## 2023-06-28 DIAGNOSIS — E43 Unspecified severe protein-calorie malnutrition: Secondary | ICD-10-CM | POA: Diagnosis present

## 2023-06-28 DIAGNOSIS — G9341 Metabolic encephalopathy: Secondary | ICD-10-CM | POA: Diagnosis present

## 2023-06-28 DIAGNOSIS — J45909 Unspecified asthma, uncomplicated: Secondary | ICD-10-CM | POA: Diagnosis present

## 2023-06-28 DIAGNOSIS — F411 Generalized anxiety disorder: Secondary | ICD-10-CM | POA: Diagnosis present

## 2023-06-28 DIAGNOSIS — Z91148 Patient's other noncompliance with medication regimen for other reason: Secondary | ICD-10-CM

## 2023-06-28 DIAGNOSIS — M069 Rheumatoid arthritis, unspecified: Secondary | ICD-10-CM | POA: Diagnosis present

## 2023-06-28 DIAGNOSIS — R569 Unspecified convulsions: Secondary | ICD-10-CM | POA: Diagnosis present

## 2023-06-28 DIAGNOSIS — Z885 Allergy status to narcotic agent status: Secondary | ICD-10-CM | POA: Diagnosis not present

## 2023-06-28 DIAGNOSIS — Z96649 Presence of unspecified artificial hip joint: Secondary | ICD-10-CM | POA: Diagnosis present

## 2023-06-28 DIAGNOSIS — K861 Other chronic pancreatitis: Secondary | ICD-10-CM | POA: Diagnosis present

## 2023-06-28 DIAGNOSIS — R1312 Dysphagia, oropharyngeal phase: Secondary | ICD-10-CM | POA: Diagnosis present

## 2023-06-28 DIAGNOSIS — Z91040 Latex allergy status: Secondary | ICD-10-CM

## 2023-06-28 DIAGNOSIS — E785 Hyperlipidemia, unspecified: Secondary | ICD-10-CM | POA: Diagnosis present

## 2023-06-28 DIAGNOSIS — Z881 Allergy status to other antibiotic agents status: Secondary | ICD-10-CM | POA: Diagnosis not present

## 2023-06-28 DIAGNOSIS — D72829 Elevated white blood cell count, unspecified: Secondary | ICD-10-CM | POA: Diagnosis not present

## 2023-06-28 DIAGNOSIS — Z85828 Personal history of other malignant neoplasm of skin: Secondary | ICD-10-CM

## 2023-06-28 DIAGNOSIS — Z7952 Long term (current) use of systemic steroids: Secondary | ICD-10-CM | POA: Diagnosis not present

## 2023-06-28 DIAGNOSIS — K219 Gastro-esophageal reflux disease without esophagitis: Secondary | ICD-10-CM | POA: Diagnosis present

## 2023-06-28 DIAGNOSIS — G40901 Epilepsy, unspecified, not intractable, with status epilepticus: Principal | ICD-10-CM | POA: Diagnosis present

## 2023-06-28 DIAGNOSIS — E874 Mixed disorder of acid-base balance: Secondary | ICD-10-CM | POA: Diagnosis present

## 2023-06-28 DIAGNOSIS — Z79891 Long term (current) use of opiate analgesic: Secondary | ICD-10-CM

## 2023-06-28 DIAGNOSIS — M797 Fibromyalgia: Secondary | ICD-10-CM | POA: Diagnosis present

## 2023-06-28 DIAGNOSIS — Z91048 Other nonmedicinal substance allergy status: Secondary | ICD-10-CM

## 2023-06-28 DIAGNOSIS — G894 Chronic pain syndrome: Secondary | ICD-10-CM | POA: Diagnosis present

## 2023-06-28 DIAGNOSIS — Z6828 Body mass index (BMI) 28.0-28.9, adult: Secondary | ICD-10-CM

## 2023-06-28 DIAGNOSIS — Z886 Allergy status to analgesic agent status: Secondary | ICD-10-CM

## 2023-06-28 DIAGNOSIS — Z91199 Patient's noncompliance with other medical treatment and regimen due to unspecified reason: Secondary | ICD-10-CM

## 2023-06-28 DIAGNOSIS — Z87898 Personal history of other specified conditions: Secondary | ICD-10-CM | POA: Diagnosis not present

## 2023-06-28 DIAGNOSIS — R748 Abnormal levels of other serum enzymes: Secondary | ICD-10-CM | POA: Diagnosis present

## 2023-06-28 DIAGNOSIS — Z882 Allergy status to sulfonamides status: Secondary | ICD-10-CM

## 2023-06-28 DIAGNOSIS — M316 Other giant cell arteritis: Secondary | ICD-10-CM | POA: Diagnosis present

## 2023-06-28 DIAGNOSIS — G40909 Epilepsy, unspecified, not intractable, without status epilepticus: Secondary | ICD-10-CM

## 2023-06-28 DIAGNOSIS — G9340 Encephalopathy, unspecified: Secondary | ICD-10-CM | POA: Diagnosis not present

## 2023-06-28 DIAGNOSIS — E873 Alkalosis: Secondary | ICD-10-CM | POA: Insufficient documentation

## 2023-06-28 DIAGNOSIS — J690 Pneumonitis due to inhalation of food and vomit: Secondary | ICD-10-CM | POA: Diagnosis present

## 2023-06-28 DIAGNOSIS — F17210 Nicotine dependence, cigarettes, uncomplicated: Secondary | ICD-10-CM | POA: Diagnosis present

## 2023-06-28 DIAGNOSIS — Z79899 Other long term (current) drug therapy: Secondary | ICD-10-CM | POA: Diagnosis not present

## 2023-06-28 DIAGNOSIS — Z90710 Acquired absence of both cervix and uterus: Secondary | ICD-10-CM

## 2023-06-28 LAB — CBC
HCT: 35.6 % — ABNORMAL LOW (ref 36.0–46.0)
Hemoglobin: 11.5 g/dL — ABNORMAL LOW (ref 12.0–15.0)
MCH: 29.6 pg (ref 26.0–34.0)
MCHC: 32.3 g/dL (ref 30.0–36.0)
MCV: 91.5 fL (ref 80.0–100.0)
Platelets: 336 10*3/uL (ref 150–400)
RBC: 3.89 MIL/uL (ref 3.87–5.11)
RDW: 15.2 % (ref 11.5–15.5)
WBC: 10.6 10*3/uL — ABNORMAL HIGH (ref 4.0–10.5)
nRBC: 0 % (ref 0.0–0.2)

## 2023-06-28 LAB — COMPREHENSIVE METABOLIC PANEL
ALT: 11 U/L (ref 0–44)
AST: 20 U/L (ref 15–41)
Albumin: 1.9 g/dL — ABNORMAL LOW (ref 3.5–5.0)
Alkaline Phosphatase: 111 U/L (ref 38–126)
Anion gap: 8 (ref 5–15)
BUN: <5 mg/dL — ABNORMAL LOW (ref 6–20)
CO2: 33 mmol/L — ABNORMAL HIGH (ref 22–32)
Calcium: 7.9 mg/dL — ABNORMAL LOW (ref 8.9–10.3)
Chloride: 98 mmol/L (ref 98–111)
Creatinine, Ser: 0.64 mg/dL (ref 0.44–1.00)
GFR, Estimated: >60 mL/min (ref >60)
Glucose, Bld: 97 mg/dL (ref 70–99)
Potassium: 3.2 mmol/L — ABNORMAL LOW (ref 3.5–5.1)
Sodium: 139 mmol/L (ref 135–145)
Total Bilirubin: 0.7 mg/dL (ref <1.2)
Total Protein: 6 g/dL — ABNORMAL LOW (ref 6.5–8.1)

## 2023-06-28 LAB — AMMONIA: Ammonia: <10 umol/L (ref 9–35)

## 2023-06-28 LAB — GLUCOSE, CAPILLARY: Glucose-Capillary: 93 mg/dL (ref 70–99)

## 2023-06-28 MED ORDER — LEVETIRACETAM IN NACL 1000 MG/100ML IV SOLN: 1000.0000 mg | Freq: Two times a day (BID) | INTRAVENOUS | Status: DC

## 2023-06-28 MED ORDER — LORAZEPAM 2 MG/ML IJ SOLN: 2.0000 mg | INTRAMUSCULAR | Status: DC | PRN

## 2023-06-28 MED ORDER — DEXTROSE 50 % IV SOLN: 1.0000 | INTRAVENOUS | Status: DC | PRN

## 2023-06-28 MED ORDER — SODIUM CHLORIDE 0.9 % IV SOLN
200.0000 mg | Freq: Two times a day (BID) | INTRAVENOUS | Status: AC
  Administered 2023-06-28 – 2023-07-01 (×7): 200 mg via INTRAVENOUS
  Filled 2023-06-28 (×7): qty 20

## 2023-06-28 MED ORDER — ALBUTEROL SULFATE (2.5 MG/3ML) 0.083% IN NEBU: 2.5000 mg | INHALATION_SOLUTION | Freq: Four times a day (QID) | RESPIRATORY_TRACT | Status: DC | PRN

## 2023-06-28 MED ORDER — LORAZEPAM 2 MG/ML IJ SOLN: 1.0000 mg | Freq: Once | INTRAMUSCULAR | Status: AC

## 2023-06-28 MED ORDER — POTASSIUM CHLORIDE 10 MEQ/100ML IV SOLN
10.0000 meq | INTRAVENOUS | Status: AC
  Administered 2023-06-28: 10 meq via INTRAVENOUS
  Filled 2023-06-28: qty 100

## 2023-06-28 MED ORDER — ENOXAPARIN SODIUM 40 MG/0.4ML IJ SOSY
40.0000 mg | PREFILLED_SYRINGE | INTRAMUSCULAR | Status: DC
  Administered 2023-06-28 – 2023-07-03 (×6): 40 mg via SUBCUTANEOUS
  Filled 2023-06-28 (×6): qty 0.4

## 2023-06-28 MED ORDER — SODIUM CHLORIDE 0.9 % IV SOLN: 250.0000 mL | INTRAVENOUS | Status: AC | PRN

## 2023-06-28 MED ORDER — SODIUM CHLORIDE 0.9% FLUSH: 3.0000 mL | INTRAVENOUS | Status: DC | PRN

## 2023-06-28 MED ORDER — SENNOSIDES-DOCUSATE SODIUM 8.6-50 MG PO TABS: 1.0000 | ORAL_TABLET | Freq: Every evening | ORAL | Status: DC | PRN

## 2023-06-28 MED ORDER — LACTATED RINGERS IV SOLN: INTRAVENOUS | Status: AC

## 2023-06-28 MED ORDER — LORAZEPAM 2 MG/ML IJ SOLN
1.0000 mg | Freq: Once | INTRAMUSCULAR | Status: AC
  Administered 2023-06-28: 1 mg via INTRAVENOUS
  Filled 2023-06-28: qty 1

## 2023-06-28 MED ORDER — POTASSIUM CHLORIDE 10 MEQ/100ML IV SOLN
10.0000 meq | INTRAVENOUS | Status: AC
  Administered 2023-06-28 (×3): 10 meq via INTRAVENOUS
  Filled 2023-06-28 (×2): qty 100

## 2023-06-28 MED ORDER — SODIUM CHLORIDE 0.9% FLUSH
3.0000 mL | Freq: Two times a day (BID) | INTRAVENOUS | Status: DC
Start: 2023-06-28 — End: 2023-07-04
  Administered 2023-06-28 – 2023-07-04 (×12): 3 mL via INTRAVENOUS

## 2023-06-28 MED ORDER — LORAZEPAM 2 MG/ML IJ SOLN
INTRAMUSCULAR | Status: AC
  Administered 2023-06-28: 1 mg via INTRAVENOUS
  Filled 2023-06-28: qty 1

## 2023-06-28 MED ORDER — SODIUM CHLORIDE 0.9 % IV SOLN
200.0000 mg | Freq: Once | INTRAVENOUS | Status: AC
  Administered 2023-06-28: 200 mg via INTRAVENOUS
  Filled 2023-06-28: qty 20

## 2023-06-28 MED ORDER — ONDANSETRON HCL 4 MG/2ML IJ SOLN
4.0000 mg | Freq: Four times a day (QID) | INTRAMUSCULAR | Status: DC | PRN
  Administered 2023-07-02 – 2023-07-03 (×2): 4 mg via INTRAVENOUS
  Filled 2023-06-28 (×2): qty 2

## 2023-06-28 MED ORDER — ONDANSETRON HCL 4 MG PO TABS: 4.0000 mg | ORAL_TABLET | Freq: Four times a day (QID) | ORAL | Status: DC | PRN

## 2023-06-28 MED ORDER — LEVETIRACETAM IN NACL 1000 MG/100ML IV SOLN
1000.0000 mg | Freq: Two times a day (BID) | INTRAVENOUS | Status: DC
  Administered 2023-06-28 – 2023-06-30 (×5): 1000 mg via INTRAVENOUS
  Filled 2023-06-28 (×5): qty 100

## 2023-06-28 MED ORDER — LORAZEPAM 2 MG/ML IJ SOLN
2.0000 mg | INTRAMUSCULAR | Status: DC | PRN
  Administered 2023-06-30: 2 mg via INTRAVENOUS
  Filled 2023-06-28 (×2): qty 1

## 2023-06-28 NOTE — Plan of Care (Signed)
  Problem: Safety: Goal: Non-violent Restraint(s) Outcome: Progressing   Problem: Clinical Measurements: Goal: Diagnostic test results will improve Outcome: Progressing Goal: Respiratory complications will improve Outcome: Progressing   Problem: Activity: Goal: Risk for activity intolerance will decrease Outcome: Progressing   Problem: Elimination: Goal: Will not experience complications related to urinary retention Outcome: Progressing   Problem: Pain Management: Goal: General experience of comfort will improve Outcome: Progressing   Problem: Safety: Goal: Ability to remain free from injury will improve Outcome: Progressing   Problem: Skin Integrity: Goal: Risk for impaired skin integrity will decrease Outcome: Progressing

## 2023-06-28 NOTE — Progress Notes (Signed)
  Progress Note   Patient: Abigail [name] [facility] FTD:[id number] DOB: 10-04-1965 DOA: 06/28/2023     0 DOS: the patient was seen and examined on 06/28/2023   Brief hospital course: 57 year old woman PMH including epilepsy, rheumatoid arthritis, giant cell arteritis on chronic prednisone, chronic pain syndrome on chronic opioids, presented with altered mental status.  Found down in living room per family.  Stopped all seizure medication about a month ago.  Was noted to be altered in the emergency department with concern for subclinical seizures.  Loaded on Keppra and Vimpat.  Transferred ED to ED to [facility] for video EEG monitoring.  Neurology involved.  Consultants Neurology   Procedures None  Assessment and Plan: Epilepsy  Concern for subclinical seizures, status epilepticus Agitation CT head no acute abnormality.  CMP, CBC unrevealing.  Continue video EEG, antiepileptics as per neurology. Currently on 6 L for unclear reasons.  I do not see confirmed documentation of hypoxia.  Wean off oxygen if able. Aspiration precautions.  Fall precautions.  Keep head of bed elevated. NPO.  Maintenance IV fluids.   Acute metabolic encephalopathy Presumably secondary to above.  Head CT was unrevealing. Ammonia level unremarkable.  Chemistry panel unremarkable. Remain n.p.o. for now  Hypokalemia Magnesium level normal.  Replete potassium.   Metabolic alkalosis Monitor   Elevated alkaline phosphatase Resolved   Leukocytosis Trending down.  No signs or symptoms of infection.  Probably secondary to seizure.   History of rheumatoid arthritis History of giant cell arteritis PAD PAD Currently off steroids as per last rheumatology note.   Reactive airway disease Lungs are clear.  Bronchodilators as needed.   GERD   Generalized anxiety disorder Holding oral medication as patient is completely altered and risk of aspiration.     Subjective:  Altered, confused, cannot provide  history or follow commands presently.  Physical Exam: Vitals:   06/28/23 0600 06/28/23 0615 06/28/23 0630 06/28/23 1026  BP: 129/75 135/73 139/71   Pulse: 90 83 (!) 107   Resp: (!) 24 (!) 29 (!) 26   Temp:    98.7 F (37.1 C)  TempSrc:    Oral  SpO2: 96% 100% 92%    Physical Exam Vitals reviewed.  Constitutional:      General: She is not in acute distress.    Appearance: She is ill-appearing. She is not toxic-appearing.     Comments: Currently protecting airway  Cardiovascular:     Rate and Rhythm: Normal rate and regular rhythm.     Heart sounds: No murmur heard. Pulmonary:     Effort: Pulmonary effort is normal. No respiratory distress.     Breath sounds: No wheezing, rhonchi or rales.  Musculoskeletal:     Right lower leg: No edema.     Left lower leg: No edema.     Comments: Moves all extremities spontaneously.  Does not follow commands.  Neurological:     Mental Status: She is alert. She is disoriented.  Psychiatric:     Comments: Unable to assess mood or affect.  Patient agitated, restless, does not answer questions.     Data Reviewed: K+ 3.2 WBC down to 10.6  Family Communication: none present  Disposition: Status is: Inpatient Remains inpatient appropriate because: seizure  Planned Discharge Destination:  TBD    Time spent: 35 minutes  Author: Brendia Sacks, MD 06/28/2023 10:36 AM  For on call review [email or web address].

## 2023-06-28 NOTE — ED Notes (Signed)
..  EMTALA: REQUIRED DOCUMENTATION COMPLETED AND REVIEWED BY WRITER PRIOR TO PT TRANSFER MD REASSESSMENT EMTALA RN SECTION TRANSFER E-SIGN VS WITHIN REQUIRED TIME

## 2023-06-28 NOTE — ED Notes (Signed)
ED TO INPATIENT HANDOFF REPORT  ED Nurse Name and Phone #: Carollee Herter [phone]  S Name/Age/Gender Kippy [facility] 57 y.o. female Room/Bed: 031C/031C  Code Status   Code Status: Full Code  Home/SNF/Other Home Disoriented x4 Is this baseline? No   Triage Complete: Triage complete  Chief Complaint Seizure [facility]) [R56.9]  Triage Note Report received from [facility], pt transferred from [facility] for altered mental status/concern for epileptic seizures. Sent to [facility] for continuous EEG as recommended by neurology. Pt has received a 4.5mg  of Keppra PTA. Arrives altered and thrashing in bed    Allergies Allergies  Allergen Reactions   Nsaids Hives   Tapentadol Swelling, Rash and Other (See Comments)    Nucynta- Made her deathly sick   Cephalexin Other (See Comments)    Reaction not cited   Codeine Other (See Comments)    Reaction not cited   Darvocet [Propoxyphene N-Acetaminophen] Other (See Comments)    Reaction not cited   Latex Other (See Comments)    Reaction not cited   Silicone Other (See Comments)    Reaction not cited   Sulfa Antibiotics Hives   Tape Other (See Comments)    Reaction not cited   Meloxicam Rash    Level of Care/Admitting Diagnosis ED Disposition     ED Disposition  Admit   Condition  --   Comment  Hospital Area: [facility] [[id number]]  Level of Care: Progressive [102]  Admit to Progressive based on following criteria: Other see comments  Admit to Progressive based on following criteria: NEUROLOGICAL AND NEUROSURGICAL complex patients with significant risk of instability, who do not meet ICU criteria, yet require close observation or frequent assessment (< / = every 2 - 4 hours) with medical / nursing intervention.  Comments: Recurrent seizure and status epilepticus  May admit patient to [facility] or [facility] if equivalent level of care is available:: No  Covid Evaluation: Asymptomatic - no recent exposure (last 10  days) testing not required  Diagnosis: Seizure [facility]) [[id number]]  Admitting Physician: Tereasa Coop [[id number]]  Attending Physician: Tereasa Coop [[id number]]  Certification:: I certify this patient will need inpatient services for at least 2 midnights  Expected Medical Readiness: 07/03/2023          B Medical/Surgery History Past Medical History:  Diagnosis Date   Anxiety    Arthritis    Back pain    Basal cell carcinoma 10/04/2021   R axilla - needs excised 11/28/21   Basal cell carcinoma 10/04/2021   L antecubital excised 11/14/21   Diarrhea 11/12/2016   Fibromyalgia    Generalized abdominal pain 11/12/2016   H. pylori infection    Hyperlipidemia    IBS (irritable bowel syndrome)    Infectious colitis 04/29/2016   Migraines    Moderate dehydration 04/29/2016   Muscle pain    Opioid overdose (HCC) 12/07/2022   Reflux    Unexplained weight loss 11/12/2016   Past Surgical History:  Procedure Laterality Date   ABDOMINAL HYSTERECTOMY     APPENDECTOMY  2009   C5 FUSION     C6 FUSION     C7 FUSION     COLONOSCOPY  02/2006   COLONOSCOPY WITH PROPOFOL N/A 12/25/2016   Procedure: COLONOSCOPY WITH PROPOFOL;  Surgeon: Earline Mayotte, MD;  Location: [facility] ENDOSCOPY;  Service: Endoscopy;  Laterality: N/A;   ESOPHAGOGASTRODUODENOSCOPY (EGD) WITH PROPOFOL N/A 12/25/2016   Procedure: ESOPHAGOGASTRODUODENOSCOPY (EGD) WITH PROPOFOL;  Surgeon: Earline Mayotte, MD;  Location:  [facility] ENDOSCOPY;  Service: Endoscopy;  Laterality: N/A;   FOOT SURGERY     HIP ARTHROPLASTY     L4 FUSION     L5 FUSION     S1 FUSION       A IV Location/Drains/Wounds Patient Lines/Drains/Airways Status     Active Line/Drains/Airways     Name Placement date Placement time Site Days   Peripheral IV 06/27/23 20 G Right Antecubital 06/27/23  2133  Antecubital  1   Peripheral IV 06/28/23 22 G Posterior;Right Hand 06/28/23  0121  Hand  less than 1            Intake/Output Last 24  hours  Intake/Output Summary (Last 24 hours) at 06/28/2023 1124 Last data filed at 06/28/2023 0345 Gross per 24 hour  Intake 45 ml  Output --  Net 45 ml    Labs/Imaging Results for orders placed or performed during the hospital encounter of 06/28/23 (from the past 48 hour(s))  Comprehensive metabolic panel     Status: Abnormal   Collection Time: 06/28/23  4:44 AM  Result Value Ref Range   Sodium 139 135 - 145 mmol/L   Potassium 3.2 (L) 3.5 - 5.1 mmol/L   Chloride 98 98 - 111 mmol/L   CO2 33 (H) 22 - 32 mmol/L   Glucose, Bld 97 70 - 99 mg/dL    Comment: Glucose reference range applies only to samples taken after fasting for at least 8 hours.   BUN <5 (L) 6 - 20 mg/dL   Creatinine, Ser 5.73 0.44 - 1.00 mg/dL   Calcium 7.9 (L) 8.9 - 10.3 mg/dL   Total Protein 6.0 (L) 6.5 - 8.1 g/dL   Albumin 1.9 (L) 3.5 - 5.0 g/dL   AST 20 15 - 41 U/L   ALT 11 0 - 44 U/L   Alkaline Phosphatase 111 38 - 126 U/L   Total Bilirubin 0.7 <1.2 mg/dL   GFR, Estimated >22 >02 mL/min    Comment: (NOTE) Calculated using the CKD-EPI Creatinine Equation (2021)    Anion gap 8 5 - 15    Comment: Performed at [facility], [address] [place], [place] [address]  CBC     Status: Abnormal   Collection Time: 06/28/23  4:44 AM  Result Value Ref Range   WBC 10.6 (H) 4.0 - 10.5 K/uL   RBC 3.89 3.87 - 5.11 MIL/uL   Hemoglobin 11.5 (L) 12.0 - 15.0 g/dL   HCT 62.3 (L) 76.2 - 83.1 %   MCV 91.5 80.0 - 100.0 fL   MCH 29.6 26.0 - 34.0 pg   MCHC 32.3 30.0 - 36.0 g/dL   RDW 51.7 61.6 - 07.3 %   Platelets 336 150 - 400 K/uL   nRBC 0.0 0.0 - 0.2 %    Comment: Performed at [facility], [address] [place], [place] [address]  Ammonia     Status: None   Collection Time: 06/28/23  4:44 AM  Result Value Ref Range   Ammonia <10 9 - 35 umol/L    Comment: Performed at [facility], [address], [place], [place] [address]   Overnight EEG with video  Result Date: 06/28/2023 [name],  MD     06/28/2023  8:38 AM Patient Name: [facility] MRN: [id number] Epilepsy Attending: [name] Referring Physician/Provider: Erick Blinks, MD Duration: 06/28/2023 0430 to 0830 Patient history:  57 y.o. female with hx of hx of epilepsy, RA,  giant cell arteritis, prescription abuse p/w AMS throughout the day. EEG to evaluate for seizure Level of alertness: Awake, asleep AEDs during EEG study: LEV, LCM, Ativan Technical aspects: This EEG study was done with scalp electrodes positioned according to the 10-20 International system of electrode placement. Electrical activity was reviewed with band pass filter of 1-70Hz , sensitivity of 7 uV/mm, display speed of 31mm/sec with a 60Hz  notched filter applied as appropriate. EEG data were recorded continuously and digitally stored.  Video monitoring was available and reviewed as appropriate. Description: No posterior dominant rhythm was seen. Sleep was characterized by vertex waves, sleep spindles (12 to 14 Hz), maximal frontocentral region. EEG showed continuous generalized polymorphic sharply contoured 3 to 6 Hz theta-delta slowing. Hyperventilation and photic stimulation were not performed.   ABNORMALITY - Continuous slow, generalized IMPRESSION: This study is suggestive of moderate to severe diffuse encephalopathy. No seizures or epileptiform discharges were seen throughout the recording. [name]   CT HEAD WO CONTRAST ( )  Result Date: 06/27/2023 CLINICAL DATA:  Mental status change, unknown cause EXAM: CT HEAD WITHOUT CONTRAST TECHNIQUE: Contiguous axial images were obtained from the base of the skull through the vertex without intravenous contrast. RADIATION DOSE REDUCTION: This exam was performed according to the departmental dose-optimization program which includes automated exposure control, adjustment of the mA and/or kV according to patient size and/or use of iterative reconstruction technique. COMPARISON:  Brain MRI  12/18/2022 FINDINGS: Brain: No intracranial hemorrhage, mass effect, or midline shift. No hydrocephalus. The basilar cisterns are patent. Minimal chronic white matter changes in the anterior limb of the internal capsular stable. No evidence of territorial infarct or acute ischemia. No extra-axial or intracranial fluid collection. Vascular: No hyperdense vessel or unexpected calcification. Skull: No fracture or focal lesion. Sinuses/Orbits: Chronic left maxillary mucosal thickening, only partially included. No acute findings. Other: None. IMPRESSION: No acute intracranial abnormality. Electronically Signed   By: Narda Rutherford M.D.   On: 06/27/2023 23:33   DG Chest Portable 1 View  Result Date: 06/27/2023 CLINICAL DATA:  Altered mental status. EXAM: PORTABLE CHEST 1 VIEW COMPARISON:  12/24/2022 FINDINGS: Low lung volumes persist. Normal heart size with stable mediastinal contours. Atelectasis at both lung basis without confluent airspace disease. No pleural effusion or pneumothorax. No acute osseous findings. IMPRESSION: Low lung volumes with bibasilar atelectasis. Electronically Signed   By: Narda Rutherford M.D.   On: 06/27/2023 23:03    Pending Labs Unresulted Labs (From admission, onward)    None       Vitals/Pain Today's Vitals   06/28/23 1009 06/28/23 1026 06/28/23 1030 06/28/23 1100  BP:   135/79 120/88  Pulse:   83 85  Resp:   (!) 21 (!) 34  Temp:  98.7 F (37.1 C)    TempSrc:  Oral    SpO2:   100% 96%  PainSc: 0-No pain       Isolation Precautions No active isolations  Medications Medications  potassium chloride 10 mEq in 100 mL IVPB (0 mEq Intravenous Stopped 06/28/23 1026)  lacosamide (VIMPAT) 200 mg in sodium chloride 0.9 % 25 mL IVPB (has no administration in time range)  enoxaparin (LOVENOX) injection 40 mg (has no administration in time range)  sodium chloride flush (NS) 0.9 % injection 3 mL (3 mLs Intravenous Not Given 06/28/23 1025)  sodium chloride flush (NS) 0.9  % injection 3 mL (has no administration in time range)  0.9 %  sodium chloride infusion (has no administration in time range)  ondansetron (ZOFRAN)  tablet 4 mg (has no administration in time range)    Or  ondansetron (ZOFRAN) injection 4 mg (has no administration in time range)  levETIRAcetam (KEPPRA) IVPB 1000 mg/100 mL premix (0 mg Intravenous Stopped 06/28/23 1034)  lactated ringers infusion ( Intravenous New Bag/Given 06/28/23 0515)  dextrose 50 % solution 50 mL (has no administration in time range)  LORazepam (ATIVAN) injection 2 mg (has no administration in time range)  albuterol (PROVENTIL) (2.5 MG/3ML) 0.083% nebulizer solution 2.5 mg (has no administration in time range)  LORazepam (ATIVAN) injection 1 mg (1 mg Intravenous Given 06/28/23 0409)  lacosamide (VIMPAT) 200 mg in sodium chloride 0.9 % 25 mL IVPB (0 mg Intravenous Stopped 06/28/23 0345)    Mobility walks     Focused Assessments    R Recommendations: See Admitting Provider Note  Report given to:   Additional Notes: I have tried to call family to get a baseline for pt but no answer at this time.

## 2023-06-28 NOTE — Progress Notes (Signed)
LTM EEG running - no initial skin breakdown - push button tested - neuro notified.  

## 2023-06-28 NOTE — Progress Notes (Signed)
Admission questions not completed no family with patient and patient not able to answer the questions.

## 2023-06-28 NOTE — ED Notes (Signed)
When entering room Abigail [name] was attempting to slide off the bed. RN and NT slid Abigail [name] back into bed.   Abigail [name] was able to state she is not in any pain and comfortable temperature wise. Abigail [name] disoriented x4  Dr. Irene Limbo assessing Abigail [name] at this time.

## 2023-06-28 NOTE — ED Notes (Signed)
Report received from [facility], pt transferred from [facility] for altered mental status/concern for epileptic seizures. Sent to [facility] for continuous EEG as recommended by neurology. Pt has received a 4.5mg  of Keppra PTA. Arrives altered and thrashing in bed

## 2023-06-28 NOTE — ED Provider Notes (Signed)
  Provider Note MRN:  [id number]  Arrival date & time: 06/28/23    ED Course and Medical Decision Making  I assumed care of patient upon transfer from [facility] emergency department.   Encephalopathy and agitation, concern for seizure activity ?status epilepticus, has been noncompliant with medications.  Plan is for hospitalist admission.  .Critical Care  Performed by: Sabas Sous, MD Authorized by: Sabas Sous, MD   Critical care provider statement:    Critical care time (minutes):  30   Critical care was necessary to treat or prevent imminent or life-threatening deterioration of the following conditions:  CNS failure or compromise   Critical care was time spent personally by me on the following activities:  Development of treatment plan with patient or surrogate, discussions with consultants, evaluation of patient's response to treatment, examination of patient, ordering and review of laboratory studies, ordering and review of radiographic studies, ordering and performing treatments and interventions, pulse oximetry, re-evaluation of patient's condition and review of old charts   Final Clinical Impressions(s) / ED Diagnoses     ICD-10-CM   1. Seizure (HCC)  R56.9       ED Discharge Orders     None       Discharge Instructions   None     Elmer Sow. Pilar Plate, MD [facility] Emergency Medicine [facility] [email or web address]    Sabas Sous, MD 06/28/23 [phone]

## 2023-06-28 NOTE — Plan of Care (Signed)
New admission patient confused and combative and thrashing in bed off and on.

## 2023-06-28 NOTE — ED Notes (Signed)
RN was with a critical care pt and resumed care over this pt at this time.

## 2023-06-28 NOTE — ED Notes (Signed)
Neurology rounding at bedside

## 2023-06-28 NOTE — Hospital Course (Addendum)
57 year old woman PMH including epilepsy, rheumatoid arthritis, giant cell arteritis on chronic prednisone, chronic pain syndrome on chronic opioids, presented with altered mental status.  Found down in living room per family.  Stopped all seizure medication about a month ago.  Was noted to be altered in the emergency department with concern for subclinical seizures.  Loaded on Keppra and Vimpat.  Transferred ED to ED to [facility] for video EEG monitoring.  Neurology involved.  Consultants Neurology   Procedures None

## 2023-06-28 NOTE — ED Triage Notes (Signed)
Report received from [facility], pt transferred from [facility] for altered mental status/concern for epileptic seizures. Sent to [facility] for continuous EEG as recommended by neurology. Pt has received a 4.5mg  of Keppra PTA. Arrives altered and thrashing in bed

## 2023-06-28 NOTE — Procedures (Signed)
Patient Name: Abigail [name] [facility]  MRN: [id number]  Epilepsy Attending: [name]  Referring Physician/Provider: Erick Blinks, MD  Duration: 06/28/2023 0430 to 06/29/2023 0430  Patient history:  57 y.o. female with hx of hx of epilepsy, RA, giant cell arteritis, prescription abuse p/w AMS throughout the day. EEG to evaluate for seizure  Level of alertness: Awake, asleep  AEDs during EEG study: LEV, LCM, Ativan  Technical aspects: This EEG study was done with scalp electrodes positioned according to the 10-20 International system of electrode placement. Electrical activity was reviewed with band pass filter of 1-70Hz , sensitivity of 7 uV/mm, display speed of 50mm/sec with a 60Hz  notched filter applied as appropriate. EEG data were recorded continuously and digitally stored.  Video monitoring was available and reviewed as appropriate.  Description: No posterior dominant rhythm was seen. Sleep was characterized by vertex waves, sleep spindles (12 to 14 Hz), maximal frontocentral region. EEG showed continuous generalized polymorphic sharply contoured 3 to 6 Hz theta-delta slowing. Generalized periodic discharges with triphasic morphology at 1-1.5 hz were also noted.  Hyperventilation and photic stimulation were not performed.     ABNORMALITY - Periodic discharges with triphasic morphology, generalized - Continuous slow, generalized  IMPRESSION: This study is suggestive of moderate to severe diffuse encephalopathy likely secondary to toxic-metabolic causes. No seizures or definite epileptiform discharges were seen throughout the recording.  Tangia Pinard [name]

## 2023-06-28 NOTE — ED Notes (Signed)
RN notified Dr. Irene Limbo about the pt potassium. Pt received one bag with the night RN but additional bags are now discontinued. The provider put in a new order for potassium.

## 2023-06-28 NOTE — ED Notes (Signed)
RN attempted to start potassium run but noticed pt IV infiltrated. RN had a second Charity fundraiser verify. Pt maintenance fluids was moved to [facility] IV.   Pt right wrist IV removed. Pharmacy contacted about the Keppra medication being infiltrated. At this time pharmacy states no additional interventions needed for this medication.   Pt maintenance fluid and potassium started at right AC IV.

## 2023-06-28 NOTE — Progress Notes (Signed)
ED hookup, [facility] NOT monitoring HU charge captured

## 2023-06-28 NOTE — Consult Note (Signed)
NEUROLOGY CONSULT NOTE   Date of service: June 28, 2023 Patient Name: Abigail [name] [facility] [place] MRN:  [id number] DOB:  1965-09-03 Chief Complaint: "acte confusion, concern for subclinical seizures" Requesting Provider: Sabas Sous, MD  History of Present Illness  Serai [name] [facility] is a 57 y.o. female with hx of hx of epilepsy, RA, giant cell arteritis, prescription abuse p/w AMS throughout the day. Son at bedside reports that she was at her baseline last night. She mentioned a few days ago that she felt like seizures coming on. She has not taken seizure meds in a month. She ran out of Vimpat and never reached out to get them refilled. Son reports that earlier she ran out of Dilantin and Keppra and per neurology clinic notes from Dr. Sandria Senter shah, was maintained off of it.  Confusion worsened over the day and so he brought her to [facility] ED to get her checked out. Labs with no significant metabolic abnormalities.  CT Head negative, she was given 4500mg  of Keppra and was transferred to [facility] for cEEG.   ROS  Unable to ascertain due to encephalopathy.  Past History   Past Medical History:  Diagnosis Date   Anxiety    Arthritis    Back pain    Basal cell carcinoma 10/04/2021   R axilla - needs excised 11/28/21   Basal cell carcinoma 10/04/2021   L antecubital excised 11/14/21   Diarrhea 11/12/2016   Fibromyalgia    Generalized abdominal pain 11/12/2016   H. pylori infection    Hyperlipidemia    IBS (irritable bowel syndrome)    Infectious colitis 04/29/2016   Migraines    Moderate dehydration 04/29/2016   Muscle pain    Opioid overdose (HCC) 12/07/2022   Reflux    Unexplained weight loss 11/12/2016    Past Surgical History:  Procedure Laterality Date   ABDOMINAL HYSTERECTOMY     APPENDECTOMY  2009   C5 FUSION     C6 FUSION     C7 FUSION     COLONOSCOPY  02/2006   COLONOSCOPY WITH PROPOFOL N/A 12/25/2016   Procedure: COLONOSCOPY WITH PROPOFOL;  Surgeon: Earline Mayotte, MD;  Location: [facility] ENDOSCOPY;  Service: Endoscopy;  Laterality: N/A;   ESOPHAGOGASTRODUODENOSCOPY (EGD) WITH PROPOFOL N/A 12/25/2016   Procedure: ESOPHAGOGASTRODUODENOSCOPY (EGD) WITH PROPOFOL;  Surgeon: Earline Mayotte, MD;  Location: [facility] ENDOSCOPY;  Service: Endoscopy;  Laterality: N/A;   FOOT SURGERY     HIP ARTHROPLASTY     L4 FUSION     L5 FUSION     S1 FUSION      Family History: No family history on file.  Social History  reports that she has been smoking cigarettes. She has never used smokeless tobacco. She reports current alcohol use. She reports that she does not use drugs.  Allergies  Allergen Reactions   Nsaids Hives   Tapentadol Swelling, Rash and Other (See Comments)    Nucynta- Made her deathly sick   Cephalexin Other (See Comments)    Reaction not cited   Codeine Other (See Comments)    Reaction not cited   Darvocet [Propoxyphene N-Acetaminophen] Other (See Comments)    Reaction not cited   Latex Other (See Comments)    Reaction not cited   Silicone Other (See Comments)    Reaction not cited   Sulfa Antibiotics Hives   Tape Other (See Comments)    Reaction not cited   Meloxicam Rash    Medications   Current  Facility-Administered Medications:    lacosamide (VIMPAT) 200 mg in sodium chloride 0.9 % 25 mL IVPB, 200 mg, Intravenous, Once, Erick Blinks, MD, Last Rate: 90 mL/hr at 06/28/23 0315, 200 mg at 06/28/23 0315   LORazepam (ATIVAN) injection 1 mg, 1 mg, Intravenous, Once, Erick Blinks, MD  Current Outpatient Medications:    acetaminophen (TYLENOL) 650 MG suppository, Place 1 suppository (650 mg total) rectally every 6 (six) hours as needed for fever., Disp: 12 suppository, Rfl: 0   albuterol (VENTOLIN HFA) 108 (90 Base) MCG/ACT inhaler, Inhale 2 puffs into the lungs every 6 (six) hours as needed for wheezing or shortness of breath., Disp: , Rfl:    BC FAST PAIN RELIEF 845-65 MG PACK, Take 1 packet by mouth 2 (two) times daily as  needed (for headaches or pain)., Disp: , Rfl:    Cholecalciferol (VITAMIN D3) 50 MCG (2000 UT) TABS, Take 50 mcg by mouth daily., Disp: , Rfl:    DULoxetine (CYMBALTA) 30 MG capsule, Take 1 capsule (30 mg total) by mouth daily., Disp: 90 capsule, Rfl: 1   feeding supplement (ENSURE ENLIVE / ENSURE PLUS) LIQD, Take 237 mLs by mouth 2 (two) times daily between meals., Disp: 237 mL, Rfl: 12   folic acid 1 mg in sodium chloride 0.9 % 50 mL, Inject 1 mg into the vein daily., Disp: , Rfl:    gabapentin (NEURONTIN) 300 MG capsule, Take 300 mg by mouth 2 (two) times daily as needed., Disp: , Rfl:    hydrOXYzine (ATARAX) 25 MG tablet, Take 1 tablet (25 mg total) by mouth 3 (three) times daily as needed for itching or anxiety., Disp: 30 tablet, Rfl: 0   lacosamide (VIMPAT) 200 MG TABS tablet, Take 1 tablet (200 mg total) by mouth 2 (two) times daily., Disp: 60 tablet, Rfl: 2   levETIRAcetam (KEPPRA) 1000 MG tablet, Take 2 tablets (2,000 mg total) by mouth 2 (two) times daily., Disp: 120 tablet, Rfl: 2   magnesium oxide (MAG-OX) 400 (240 Mg) MG tablet, Take 250 mg by mouth daily., Disp: , Rfl:    megestrol (MEGACE) 40 MG/ML suspension, Take 200 mg by mouth daily., Disp: , Rfl:    modafinil (PROVIGIL) 200 MG tablet, Take 200 mg by mouth daily., Disp: , Rfl:    Multiple Vitamin (MULTIVITAMIN WITH MINERALS) TABS tablet, Take 1 tablet by mouth daily., Disp: , Rfl:    Nicotine (NICODERM CQ TD), Place 1 patch onto the skin daily as needed (for smoking cessation- while hospitalized)., Disp: , Rfl:    ondansetron (ZOFRAN) 4 MG/2ML SOLN injection, Inject 2 mLs (4 mg total) into the vein every 6 (six) hours as needed for nausea. (Patient not taking: Reported on 12/22/2022), Disp: 2 mL, Rfl: 0   oxyCODONE (ROXICODONE) 15 MG immediate release tablet, Take 15 mg by mouth 5 (five) times daily as needed., Disp: , Rfl:    pantoprazole (PROTONIX) 40 MG tablet, Take 40 mg by mouth 2 (two) times daily before a meal., Disp: , Rfl:     phenytoin (DILANTIN) 100 MG ER capsule, Take 1 capsule (100 mg total) by mouth 2 (two) times daily., Disp: 60 capsule, Rfl: 2   potassium chloride (KLOR-CON) 10 MEQ tablet, Take 10 mEq by mouth daily., Disp: , Rfl:    thiamine (VITAMIN B1) 100 MG tablet, Take 1 tablet (100 mg total) by mouth daily., Disp: 30 tablet, Rfl: 2   vitamin B-12 (CYANOCOBALAMIN) 500 MCG tablet, Take 500 mcg by mouth daily., Disp: , Rfl:  Vitals   Vitals:   06/28/23 0215 06/28/23 0230 06/28/23 0245 06/28/23 0316  BP: 105/62 113/65 (!) 137/39   Pulse: 95 99 (!) 35   Resp: (!) 24 (!) 24 (!) 27   Temp:    97.9 F (36.6 C)  TempSrc:    Axillary  SpO2:  93% 95%     There is no height or weight on file to calculate BMI.  Physical Exam   General: thrashing in the bed and now in restraints, does not appear in distress. HENT: Normal oropharynx and mucosa. Normal external appearance of ears and nose.  Neck: Supple, no pain or tenderness  CV: No JVD. No peripheral edema.  Pulmonary: Symmetric Chest rise. Normal respiratory effort.  Abdomen: Soft to touch, non-tender.  Ext: No cyanosis, edema, or deformity  Skin: No rash. Normal palpation of skin.   Musculoskeletal: Normal digits and nails by inspection. No clubbing.   Neurologic Examination  Mental status/Cognition: eyes closed, thrashing in bed. Did briefly open her eyes to loud voice and made ?brief eye contact. Does not follow commands or answer any orientation questions. Speech/language: mute, does not follow commands or identify objects or repeat. Cranial nerves:   CN II Pupils equal and reactive to light, unable to assess for VF deficit.   CN III,IV,VI EOM intact to dolls eyes, no gaze preference but has roving eye movements.   CN V Corneals intact BL   CN VII Symmetric facial grimace   CN VIII Does not open eyes or make significant eye contact to speech   CN IX & X Protecting her airway   CN XI Moves her head left to right as she thrashes around.    CN XII midline tongue   Sensory/Motor:  Muscle bulk: normal, tone normal Spontaneous any antigravity movements noted in all extremities but not to command. Localizes to pinch in all extremities.  Coordination/Complex Motor:  Unable to assess.  Labs/Imaging/Neurodiagnostic studies   CBC:  Recent Labs  Lab 07-04-2023 2246  WBC 12.8*  NEUTROABS 9.2*  HGB 12.4  HCT 38.0  MCV 91.6  PLT 383    Basic Metabolic Panel:  Lab Results  Component Value Date   NA 136 2023/07/04   K 3.0 (L) 07/04/2023   CO2 36 (H) 07-04-23   GLUCOSE 108 (H) 2023-07-04   BUN 6 07/04/23   CREATININE 0.64 07-04-23   CALCIUM 8.1 (L) 07-04-2023   GFRNONAA >60 2023-07-04   GFRAA >60 01/15/2015    Lipid Panel: No results found for: "LDLCALC"  HgbA1c:  Lab Results  Component Value Date   HGBA1C 5.5 12/24/2022    Urine Drug Screen:     Component Value Date/Time   LABOPIA NONE DETECTED 12/07/2022 2246   COCAINSCRNUR NONE DETECTED 12/07/2022 2246   LABBENZ NONE DETECTED 12/07/2022 2246   AMPHETMU NONE DETECTED 12/07/2022 2246   THCU NONE DETECTED 12/07/2022 2246   LABBARB NONE DETECTED 12/07/2022 2246     Alcohol Level     Component Value Date/Time   ETH <10 12/14/2022 1845    INR No results found for: "INR"  APTT No results found for: "APTT"  AED levels:  Lab Results  Component Value Date   PHENYTOIN <2.5 (L) 2023/07/04      CT Head without contrast(Personally reviewed): CTH was negative for a large hypodensity concerning for a large territory infarct or hyperdensity concerning for an ICH  cEEG:  pending  Impression   Newell [facility] [place] is a 57 y.o. female hx  of epilepsy, RA, giant cell arteritis, prescription abuse p/w AMS throughout the day, she was at her baseline last night. She has not taken any of her AEDs over the last month and high suspicion for potential subclinical seizures given she presented similarly back in may 2024 with confusion and found to be in  focal motor status.  No noted clinical seizure activity on my evaluation today.  Recommendations  - s/p 4500mg  of Keppra at [facility]. - continue Keppra 1000mg  BID(home dose) - Vimpat 200mg  IV once given. Continue Vimpat 200mg  BID - Ativan 1mg  for noted thrashing, this is to help her cooperate with LTM EEG. - cEEG and further AEDs based on LTM EEG. - she is protecting her airway at this time. ______________________________________________________________________   This patient is critically ill and at significant risk of neurological worsening, death and care requires constant monitoring of vital signs, hemodynamics,respiratory and cardiac monitoring, neurological assessment, discussion with family, other specialists and medical decision making of high complexity. I spent 60 minutes of neurocritical care time  in the care of  this patient. This was time spent independent of any time provided by nurse practitioner or PA.  Erick Blinks [facility] 06/28/2023  4:40 AM  Signed,  Erick Blinks [facility]

## 2023-06-28 NOTE — H&P (Addendum)
History and Physical    [name] OZD:[id number] DOB: 01-18-1966 DOA: 06/28/2023  PCP: Alease Medina, MD   Patient coming from: Home   Chief Complaint: Altered mental status  ED TRIAGE note: Patient presents to ED via [facility] from home for altered mental status. Per EMS she also has multiple focal seizures daily.  During triage, pt yelling "please help, oh god, please help" and thrashing about in bed but unable to state what she wants or needs.  HPI:  [name] is a 57 y.o. female with medical history significant of epilepsy, rheumatoid arthritis, giant cell arteritis on chronic prednisone and chronic pain syndrome on chronic opioid initially presented to [facility] emergency department for evaluation for altered mental status. Upon arrival to the ED additional history is obtained from patient's son per chart review.  Per family report patient was found lying down in the living room.  Patient did not have Edney fall but found to be confused.  Throughout the day patient seems intermittently confused and less responsive.  Reported she has been done like this before when she had seizure.  Reported stopped taking all seizure medication about a month ago. In the ED patient received Keppra load 3500 mg and Vimpat 200 mg.   Patient is hemodynamically stable and protecting her airway. ED physician discussed case with on-call neurology Dr.Khaliqdina who recommended transfer patient to [facility] for EEG.  Neurology recommended Ativan as needed for any clinical seizure lasting more than 2 minutes.  At presentation to ED heart rate 95, blood pressure 105/62 O2 sat 95% on 6 L. VBG showing pH 7.5, pCO2 56 pO2 31 and bicarb 47.  Normal mag level. CMP showing low potassium 3, low chloride 88, elevated bicarb 36, blood glucose 108, low albumin 2.8 and elevated alkaline phosphatase 134. CBC showing leukocytosis 12.8. Chest x-ray unremarkable. CT head no acute intracranial  process.  ED physician Dr. Pilar Plate reported that patient becomes confused after the seizure episodes which is why nonviolent restraint has been initiated while in the ED.  Discussed case with on-call neurology Dr.Klaliqdina recommended continue IV Keppra 1000 mg twice daily and Vimpat 200 mg twice daily.  Hospitalist has been contacted for further management of acute metabolic encephalopathy, seizure and status epilepticus.  During my evaluation at the bedside patient is completely altered.  She is able to protecting her airway.  Unable to follow any voice command.  Patient's son at the bedside who helped with giving the history reported that patient has been running out of seizure medication for last 1 month and unsure what else she is actually taking or not.  Review of Systems:  Review of Systems  Unable to perform ROS: Mental status change    Past Medical History:  Diagnosis Date   Anxiety    Arthritis    Back pain    Basal cell carcinoma 10/04/2021   R axilla - needs excised 11/28/21   Basal cell carcinoma 10/04/2021   L antecubital excised 11/14/21   Diarrhea 11/12/2016   Fibromyalgia    Generalized abdominal pain 11/12/2016   H. pylori infection    Hyperlipidemia    IBS (irritable bowel syndrome)    Infectious colitis 04/29/2016   Migraines    Moderate dehydration 04/29/2016   Muscle pain    Opioid overdose (HCC) 12/07/2022   Reflux    Unexplained weight loss 11/12/2016    Past Surgical History:  Procedure Laterality Date   ABDOMINAL HYSTERECTOMY  APPENDECTOMY  2009   C5 FUSION     C6 FUSION     C7 FUSION     COLONOSCOPY  02/2006   COLONOSCOPY WITH PROPOFOL N/A 12/25/2016   Procedure: COLONOSCOPY WITH PROPOFOL;  Surgeon: Earline Mayotte, MD;  Location: [facility] ENDOSCOPY;  Service: Endoscopy;  Laterality: N/A;   ESOPHAGOGASTRODUODENOSCOPY (EGD) WITH PROPOFOL N/A 12/25/2016   Procedure: ESOPHAGOGASTRODUODENOSCOPY (EGD) WITH PROPOFOL;  Surgeon: Earline Mayotte, MD;   Location: [facility] ENDOSCOPY;  Service: Endoscopy;  Laterality: N/A;   FOOT SURGERY     HIP ARTHROPLASTY     L4 FUSION     L5 FUSION     S1 FUSION       reports that she has been smoking cigarettes. She has never used smokeless tobacco. She reports current alcohol use. She reports that she does not use drugs.  Allergies  Allergen Reactions   Nsaids Hives   Tapentadol Swelling, Rash and Other (See Comments)    Nucynta- Made her deathly sick   Cephalexin Other (See Comments)    Reaction not cited   Codeine Other (See Comments)    Reaction not cited   Darvocet [Propoxyphene N-Acetaminophen] Other (See Comments)    Reaction not cited   Latex Other (See Comments)    Reaction not cited   Silicone Other (See Comments)    Reaction not cited   Sulfa Antibiotics Hives   Tape Other (See Comments)    Reaction not cited   Meloxicam Rash    History reviewed. No pertinent family history.  Prior to Admission medications   Medication Sig Start Date End Date Taking? Authorizing Provider  acetaminophen (TYLENOL) 650 MG suppository Place 1 suppository (650 mg total) rectally every 6 (six) hours as needed for fever. 12/18/22   Alford Highland, MD  albuterol (VENTOLIN HFA) 108 (90 Base) MCG/ACT inhaler Inhale 2 puffs into the lungs every 6 (six) hours as needed for wheezing or shortness of breath. 12/21/22   [provider]  BC FAST PAIN RELIEF 845-65 MG PACK Take 1 packet by mouth 2 (two) times daily as needed (for headaches or pain).    [provider]  Cholecalciferol (VITAMIN D3) 50 MCG (2000 UT) TABS Take 50 mcg by mouth daily.    [provider]  DULoxetine (CYMBALTA) 30 MG capsule Take 1 capsule (30 mg total) by mouth daily. 12/26/22   Lonia Blood, MD  feeding supplement (ENSURE ENLIVE / ENSURE PLUS) LIQD Take 237 mLs by mouth 2 (two) times daily between meals. 12/26/22   Kathlen Mody, MD  folic acid 1 mg in sodium chloride 0.9 % 50 mL Inject 1 mg into the vein  daily. 12/19/22   Alford Highland, MD  gabapentin (NEURONTIN) 300 MG capsule Take 300 mg by mouth 2 (two) times daily as needed. 03/25/23   [provider]  hydrOXYzine (ATARAX) 25 MG tablet Take 1 tablet (25 mg total) by mouth 3 (three) times daily as needed for itching or anxiety. 12/25/22   Alwyn Ren, MD  lacosamide (VIMPAT) 200 MG TABS tablet Take 1 tablet (200 mg total) by mouth 2 (two) times daily. 12/25/22   Kathlen Mody, MD  levETIRAcetam (KEPPRA) 1000 MG tablet Take 2 tablets (2,000 mg total) by mouth 2 (two) times daily. 12/26/22   Lonia Blood, MD  magnesium oxide (MAG-OX) 400 (240 Mg) MG tablet Take 250 mg by mouth daily.    [provider]  megestrol (MEGACE) 40 MG/ML suspension Take 200 mg by  mouth daily. 03/25/23   [provider]  modafinil (PROVIGIL) 200 MG tablet Take 200 mg by mouth daily. 05/31/23   [provider]  Multiple Vitamin (MULTIVITAMIN WITH MINERALS) TABS tablet Take 1 tablet by mouth daily. 12/26/22   Kathlen Mody, MD  Nicotine (NICODERM CQ TD) Place 1 patch onto the skin daily as needed (for smoking cessation- while hospitalized).    [provider]  ondansetron (ZOFRAN) 4 MG/2ML SOLN injection Inject 2 mLs (4 mg total) into the vein every 6 (six) hours as needed for nausea. Patient not taking: Reported on 12/22/2022 12/18/22   Alford Highland, MD  oxyCODONE (ROXICODONE) 15 MG immediate release tablet Take 15 mg by mouth 5 (five) times daily as needed. 05/09/23   [provider]  pantoprazole (PROTONIX) 40 MG tablet Take 40 mg by mouth 2 (two) times daily before a meal.    [provider]  phenytoin (DILANTIN) 100 MG ER capsule Take 1 capsule (100 mg total) by mouth 2 (two) times daily. 12/25/22   Kathlen Mody, MD  potassium chloride (KLOR-CON) 10 MEQ tablet Take 10 mEq by mouth daily. 08/11/22   [provider]  thiamine (VITAMIN B1) 100 MG tablet Take 1 tablet (100 mg total) by mouth daily.  12/25/22   Kathlen Mody, MD  vitamin B-12 (CYANOCOBALAMIN) 500 MCG tablet Take 500 mcg by mouth daily.    [provider]     Physical Exam: Vitals:   06/28/23 0315 06/28/23 0316 06/28/23 0345 06/28/23 0415  BP: 129/73  (!) 105/91 (!) 141/77  Pulse: (!) 108  (!) 101 (!) 105  Resp: 15  (!) 22 (!) 21  Temp:  97.9 F (36.6 C)    TempSrc:  Axillary    SpO2: 96%  95% 96%    Physical Exam Constitutional:      Appearance: She is ill-appearing.  HENT:     Mouth/Throat:     Mouth: Mucous membranes are dry.  Eyes:     Conjunctiva/sclera: Conjunctivae normal.  Cardiovascular:     Rate and Rhythm: Normal rate and regular rhythm.     Pulses: Normal pulses.     Heart sounds: Normal heart sounds.  Pulmonary:     Effort: Pulmonary effort is normal.     Breath sounds: Normal breath sounds.  Abdominal:     General: Bowel sounds are normal.  Musculoskeletal:     Cervical back: Neck supple.     Right lower leg: No edema.     Left lower leg: No edema.  Skin:    General: Skin is dry.     Capillary Refill: Capillary refill takes less than 2 seconds.  Neurological:     Comments: Patient is not alert oriented.  Psychiatric:     Comments: Unable to assess.      Labs on Admission: I have personally reviewed following labs and imaging studies  CBC: Recent Labs  Lab 06/27/23 2246  WBC 12.8*  NEUTROABS 9.2*  HGB 12.4  HCT 38.0  MCV 91.6  PLT 383   Basic Metabolic Panel: Recent Labs  Lab 06/27/23 2246  NA 136  K 3.0*  CL 88*  CO2 36*  GLUCOSE 108*  BUN 6  CREATININE 0.64  CALCIUM 8.1*  MG 2.0   GFR: CrCl cannot be calculated (Unknown ideal weight.). Liver Function Tests: Recent Labs  Lab 06/27/23 2246  AST 22  ALT 11  ALKPHOS 134*  BILITOT 0.6  PROT 7.2  ALBUMIN 2.4*   Recent Labs  Lab 06/27/23 2245  LIPASE 57*   No results for input(s): "AMMONIA" in the last 168 hours. Coagulation Profile: No results for input(s): "INR", "PROTIME" in the last  168 hours. Cardiac Enzymes: No results for input(s): "CKTOTAL", "CKMB", "CKMBINDEX", "TROPONINI", "TROPONINIHS" in the last 168 hours. BNP (last 3 results) No results for input(s): "BNP" in the last 8760 hours. HbA1C: No results for input(s): "HGBA1C" in the last 72 hours. CBG: No results for input(s): "GLUCAP" in the last 168 hours. Lipid Profile: No results for input(s): "CHOL", "HDL", "LDLCALC", "TRIG", "CHOLHDL", "LDLDIRECT" in the last 72 hours. Thyroid Function Tests: No results for input(s): "TSH", "T4TOTAL", "FREET4", "T3FREE", "THYROIDAB" in the last 72 hours. Anemia Panel: No results for input(s): "VITAMINB12", "FOLATE", "FERRITIN", "TIBC", "IRON", "RETICCTPCT" in the last 72 hours. Urine analysis:    Component Value Date/Time   COLORURINE YELLOW 12/24/2022 1551   APPEARANCEUR CLEAR 12/24/2022 1551   APPEARANCEUR Clear 05/24/2014 1903   LABSPEC 1.012 12/24/2022 1551   LABSPEC 1.015 05/24/2014 1903   PHURINE 8.0 12/24/2022 1551   GLUCOSEU NEGATIVE 12/24/2022 1551   GLUCOSEU Negative 05/24/2014 1903   HGBUR NEGATIVE 12/24/2022 1551   BILIRUBINUR NEGATIVE 12/24/2022 1551   BILIRUBINUR Negative 05/24/2014 1903   KETONESUR NEGATIVE 12/24/2022 1551   PROTEINUR NEGATIVE 12/24/2022 1551   NITRITE NEGATIVE 12/24/2022 1551   LEUKOCYTESUR NEGATIVE 12/24/2022 1551   LEUKOCYTESUR Negative 05/24/2014 1903    Radiological Exams on Admission: I have personally reviewed images CT HEAD WO CONTRAST ( )  Result Date: 06/27/2023 CLINICAL DATA:  Mental status change, unknown cause EXAM: CT HEAD WITHOUT CONTRAST TECHNIQUE: Contiguous axial images were obtained from the base of the skull through the vertex without intravenous contrast. RADIATION DOSE REDUCTION: This exam was performed according to the departmental dose-optimization program which includes automated exposure control, adjustment of the mA and/or kV according to patient size and/or use of iterative reconstruction technique.  COMPARISON:  Brain MRI 12/18/2022 FINDINGS: Brain: No intracranial hemorrhage, mass effect, or midline shift. No hydrocephalus. The basilar cisterns are patent. Minimal chronic white matter changes in the anterior limb of the internal capsular stable. No evidence of territorial infarct or acute ischemia. No extra-axial or intracranial fluid collection. Vascular: No hyperdense vessel or unexpected calcification. Skull: No fracture or focal lesion. Sinuses/Orbits: Chronic left maxillary mucosal thickening, only partially included. No acute findings. Other: None. IMPRESSION: No acute intracranial abnormality. Electronically Signed   By: Narda Rutherford M.D.   On: 06/27/2023 23:33   DG Chest Portable 1 View  Result Date: 06/27/2023 CLINICAL DATA:  Altered mental status. EXAM: PORTABLE CHEST 1 VIEW COMPARISON:  12/24/2022 FINDINGS: Low lung volumes persist. Normal heart size with stable mediastinal contours. Atelectasis at both lung basis without confluent airspace disease. No pleural effusion or pneumothorax. No acute osseous findings. IMPRESSION: Low lung volumes with bibasilar atelectasis. Electronically Signed   By: Narda Rutherford M.D.   On: 06/27/2023 23:03    EKG: My personal interpretation of EKG shows: EKG showing sinus tachycardia heart rate 80.    Assessment/Plan: Principal Problem:   Seizure (HCC) Active Problems:   Acute metabolic encephalopathy   Status epilepticus (HCC)   History of seizure   Rheumatoid arthritis (HCC)   GERD (gastroesophageal reflux disease)   Giant cell arteritis (HCC)   Metabolic alkalosis    Assessment and Plan: Seizure History of epilepsy-noncompliant with medication > Patient presenting to [facility] ED with his son as found to be confused and found on the floor.  Son reported patient is not  taking antiseizure medication for 1 month and last time when she had seizure-like activity she had similar picture of altered mentation and confusion.  -Normal blood  glucose.  CMP showing hypokalemia 3 otherwise no other electrolyte derangement. -Pending Keppra and lacosamide level. - CT head unremarkable for any acute intracranial abnormality. -Neurology Dr. Derry Lory  has been consulted.  Neurology recommendation in the ED at [facility] patient has been loaded with 3.5 g of Keppra and at [facility], ED patient got 200 mg of Vimpat. - Per neurology recommendation continuing Keppra 1000 mg IV twice daily and Vimpat 200 mg IV twice daily. -Continue Ativan 2 mg every Vapne as needed for persistent seizure.  Ordered total 3 dose. -Neurology has been video-EEG. - Patient is completely obtunded however able to maintaining airway.  Currently O2 sat 96% on 6 L oxygen.  Unable to open her eye with voice command and sternal rub.  Keeping patient NPO.  High risk of aspiration. -Continue fall precaution, aspiration precaution and keep the head elevated above 30 degree angle. -Continue maintenance fluid LR 75 cc/h for 1 day.  Continue to check POC blood glucose every 4 hours and D50 as needed for hypoglycemia less than 70. -Patient is confused and becomes combative time to time.  Continue 4 point restraint for - Continue cardiac monitoring. -Admitting patient to progressive unit. -Appreciate neurology input and recommendations.  Acute metabolic encephalopathy -Acute metabolic encephalopathy in the in the context of post epilepticus in the setting of seizure. -Head CT unremarkable for any acute endocrine abnormality. - Checking ammonia level.  Normal blood glucose level.  Mild hypokalemia. -Patient is completely obtunded however able to maintaining airway.  Currently O2 sat 96% on 6 L oxygen.  Unable to open her eye with voice command and sternal rub.  Keeping patient NPO.  High risk of aspiration. -Continue fall precaution, aspiration precaution and keep the head elevated above 30 degree angle. - Continue cardiac monitoring. -Holding oral medication as patient is completely  altered and high risk of aspiration.  Hypokalemia -Potassium 3.  Normal mag level. - Giving IV potassium KCl 60M EQ x 4 doses.  Metabolic alkalosis - CMP showing bicarb 36.  VBG showed pH 7.5, pCO2 56, pO2 31 and bicarb 47. - Decompensated metabolic alkalosis in the setting of  from respiratory acidosis. -Continue supplemental oxygen for management of respiratory acidosis and continue to monitor bicarb level.     Elevated alkaline phosphatase - CMP showing AST 22, ALT 11, normal Deroo 11.06 however elevated AST 134.  Unclear etiology of elevated alkaline phosphatase at this time. - Checking continue to monitor.  Leukocytosis - WBC count 12.8.  No suspicion of any infection at this time. -Reactive leukocytosis in the setting of seizure.  Continue to monitor WBC and fever curve.  History of rheumatoid arthritis History of giant cell arteritis -Per chart review unable to find any maintenance prednisone on home medication list.   Reactive airway disease -Currently maintaining O2 sat 96% on 6 L. - Continue albuterol nebulizer as needed for any wheezing shortness of breath.  GERD -Holding oral medication  Generalized anxiety disorder -Holding oral medication as patient is completely altered and risk of aspiration.   DVT prophylaxis:  Lovenox Code Status:  Full Code.  Verified with patient's son at the bedside. Diet: Currently n.p.o. due to altered mentation. Family Communication: Patient's son was present at bedside, at the time of interview.  Opportunity was given to ask question and all questions were answered satisfactorily.  Disposition  Plan: Pending improvement of mentation and pending clinical disposition. Consults: Neurology. Admission status:   Inpatient, Step Down Unit  Severity of Illness: The appropriate patient status for this patient is INPATIENT. Inpatient status is judged to be reasonable and necessary in order to provide the required intensity of service to  ensure the patient's safety. The patient's presenting symptoms, physical exam findings, and initial radiographic and laboratory data in the context of their chronic comorbidities is felt to place them at high risk for further clinical deterioration. Furthermore, it is not anticipated that the patient will be medically stable for discharge from the hospital within 2 midnights of admission.   * I certify that at the point of admission it is my clinical judgment that the patient will require inpatient hospital care spanning beyond 2 midnights from the point of admission due to high intensity of service, high risk for further deterioration and high frequency of surveillance required.Marland Kitchen    Tereasa Coop, MD [facility]  How to contact the [facility] Attending or Consulting provider 7A - 7P or covering provider during after hours 7P -7A, for this patient.  Check the care team in [facility] and look for a) attending/consulting [facility] provider listed and b) the [facility] team listed Log into [email or web address] and use [place]'s universal password to access. If you do not have the password, please contact the hospital operator. Locate the [facility] provider you are looking for under [facility] and page to a number that you can be directly reached. If you still have difficulty reaching the provider, please page the [facility] (Director on Call) for the Hospitalists listed on amion for assistance.  06/28/2023, 4:32 AM

## 2023-06-28 NOTE — ED Notes (Signed)
Vimpat and Cerebyx bags handed off to [employer]

## 2023-06-28 NOTE — ED Notes (Signed)
EEG being set up and performed at bedside

## 2023-06-28 NOTE — Progress Notes (Signed)
Brief [facility] note:   Received referral for admit for this patient from [facility] with possible subclinical seizures. They have consulted with neurology and patient has been loaded with AED. Neurology recommending for progressive level of care. Currently no beds available for progressive. I spoke with the outside ED provider and recommended an ED to ED transfer to [facility]. Please reach back out to [facility] for admission after patient seen in ED here.   Dolly Rias, MD  [facility]

## 2023-06-28 NOTE — Progress Notes (Signed)
Very difficult hookup. Family in room helped, but hookup required multiple techs. Pt thrashed and screamed for entirety of hookup.

## 2023-06-28 NOTE — Progress Notes (Signed)
Patient to room 11 from the ed. Patient is not alert or oriented. Patient is thrashing and kicking around on the stretched and bed. Patient has 4 point restraints on soft. MP shows nsr. Patient has some scabbed areas to lower extremities and arms. Patient not cooperative to eval rest of her skin at this time.

## 2023-06-28 NOTE — ED Provider Notes (Signed)
Care of this patient assumed from prior physician at 2350 pending discussion with outside hospital for transfer. Please see prior physician note for further details.  Briefly this is a 57 year old female with history of epilepsy with prior nonconvulsive seizures presenting with encephalopathy consistent with prior seizure activity.  Case was reviewed with neurology who did recommend transfer to [facility] for continuous EEG with progressive level of care.  If no progressive bed was available, it was recommended that patient be transferred ER to ER. Patient ordered for loading with Keppra, fosphenytoin, and Vimpat after discussion with neurology.  Case discussed with Dr. Lazarus Salines with hospitalist team.  Agreed with plan for transfer, but no progressive beds were available, so plan was made for ER to ER transfer.  Case was discussed with Dr. Clayborne Dana with ER at [facility] who accepted the patient in transfer.   Patient arousable, nodding yes or no to questions, moving extremity spontaneously.  Is currently protecting her airway without visible seizure-like activity.  Patient and family updated on plans for transfer, agreeable with this plan.  Patient anxious, screaming out when EMS attempting to transfer to stretcher.  No witnessed seizure-like activity, but ordered for 1 mg of IV Ativan to facilitate safe transport.   Trinna Post, MD 06/28/23 [phone]

## 2023-06-29 ENCOUNTER — Inpatient Hospital Stay (HOSPITAL_COMMUNITY): Payer: Medicare HMO

## 2023-06-29 DIAGNOSIS — K219 Gastro-esophageal reflux disease without esophagitis: Secondary | ICD-10-CM | POA: Diagnosis not present

## 2023-06-29 DIAGNOSIS — G40901 Epilepsy, unspecified, not intractable, with status epilepticus: Secondary | ICD-10-CM | POA: Diagnosis not present

## 2023-06-29 DIAGNOSIS — M069 Rheumatoid arthritis, unspecified: Secondary | ICD-10-CM | POA: Diagnosis not present

## 2023-06-29 DIAGNOSIS — G9341 Metabolic encephalopathy: Secondary | ICD-10-CM | POA: Diagnosis not present

## 2023-06-29 LAB — BASIC METABOLIC PANEL
Anion gap: 13 (ref 5–15)
BUN: 6 mg/dL (ref 6–20)
CO2: 27 mmol/L (ref 22–32)
Calcium: 8.3 mg/dL — ABNORMAL LOW (ref 8.9–10.3)
Chloride: 97 mmol/L — ABNORMAL LOW (ref 98–111)
Creatinine, Ser: 0.75 mg/dL (ref 0.44–1.00)
GFR, Estimated: >60 mL/min (ref >60)
Glucose, Bld: 77 mg/dL (ref 70–99)
Potassium: 3.6 mmol/L (ref 3.5–5.1)
Sodium: 137 mmol/L (ref 135–145)

## 2023-06-29 LAB — GLUCOSE, CAPILLARY
Glucose-Capillary: 75 mg/dL (ref 70–99)
Glucose-Capillary: 77 mg/dL (ref 70–99)
Glucose-Capillary: 79 mg/dL (ref 70–99)
Glucose-Capillary: 83 mg/dL (ref 70–99)
Glucose-Capillary: 99 mg/dL (ref 70–99)

## 2023-06-29 LAB — MAGNESIUM: Magnesium: 1.9 mg/dL (ref 1.7–2.4)

## 2023-06-29 MED ORDER — SODIUM CHLORIDE 0.9 % IV SOLN: INTRAVENOUS | Status: AC

## 2023-06-29 NOTE — Progress Notes (Signed)
NEUROLOGY CONSULT FOLLOW UP NOTE   Date of service: June 29, 2023 Patient Name: Glennda Louk [facility] MRN:  [id number] DOB:  August 16, 1966  Brief HPI  [facility] is a 57 y.o. female with hx of hx of epilepsy, RA, GCA, ongoing tobacco use, Rx abuse p/w AMS throughout the day 06/28/23. Son at bedside reported that she was at her baseline the night PTA (06/27/23). She mentioned a few days PTA that she felt like seizures coming on. She has not taken seizure meds in a month. She ran out of Vimpat and never requested for refill. Son reports that earlier she ran out of Dilantin and Keppra and per neurology clinic notes from Dr. Sandria Senter shah, was maintained off of it. Patient's confusion progressed throughout the day and she was taken to [facility] for evaluation.  Initial lab work obtained without significant metabolic abnormalities, CTH was negative, she was loaded with 4,500 mg of Keppra and transferred to [facility] for cEEG.    Interval Hx/subjective  Patient with significant lethargy on exam this morning, strong urine smell noted during exam. Continuous EEG monitoring in place. No family present at bedside during exam.   Vitals   Vitals:   06/29/23 0012 06/29/23 0400 06/29/23 0747 06/29/23 1200  BP: 106/85 (!) 143/83  (!) 145/82  Pulse: 93 91 93   Resp: 18 17    Temp: 98.1 F (36.7 C) 97.6 F (36.4 C)    TempSrc:  Axillary    SpO2: 91% 97% 97%     There is no height or weight on file to calculate BMI.  Physical Exam   Constitutional: Acutely-ill appearing Caucasian female in 4 point soft restraints laying in hospital bed. She does intermittently thrash with stimulation. Patient is in no acute distress.  Psych: Unable to assess due to patient's condition.  Eyes: No scleral injection.  HENT: No OP obstrucion.  Head: Normocephalic and atraumatic Cardiovascular: Normal rate and regular rhythm.  Respiratory: Effort normal, non-labored breathing on nasal cannula. Symmetric chest rise.   GI: Soft.  No distension. There is no tenderness.  Skin: WDI.   Neurologic Examination  Mental status/Cognition: Eyes closed, thrashing in bed with restless movements following application of noxious stimulation. Does not open eyes throughout exam. Does not follow commands or answer any orientation questions. Speech/language: yells briefly with pinch throughout but does not attempt to verbalize to examiner. Does not follow commands, identify objects, or repeat. Cranial nerves:   CN II Pupils equal and reactive to light with passive eye opening   CN III,IV,VI EOM intact to dolls eyes, no gaze preference but has roving eye movements.   CN V Corneals intact BL   CN VII Symmetric facial grimace with application of noxious stimuli   CN VIII Does not open eyes or make significant eye contact to speech   CN IX & X Protecting her airway   CN XI Moves her head left to right as she thrashes around.   CN XII Does not protrude tongue     Sensory/Motor:  Muscle bulk: normal, tone normal Restless antigravity, at times, thrashing movements of each extremity within the limitation of soft restraints throughout.  Localizes and yells to pinch in all extremities and grimaces throughout.    Coordination/Complex Motor:  Unable to assess as patient does not perform.   Labs and Diagnostic Imaging   CBC:  Recent Labs  Lab 06/27/23 2246 06/28/23 0444  WBC 12.8* 10.6*  NEUTROABS 9.2*  --   HGB 12.4  11.5*  HCT 38.0 35.6*  MCV 91.6 91.5  PLT 383 336    Basic Metabolic Panel:  Lab Results  Component Value Date   NA 137 06/29/2023   K 3.6 06/29/2023   CO2 27 06/29/2023   GLUCOSE 77 06/29/2023   BUN 6 06/29/2023   CREATININE 0.75 06/29/2023   CALCIUM 8.3 (L) 06/29/2023   GFRNONAA >60 06/29/2023   GFRAA >60 01/15/2015   Lipid Panel: No results found for: "LDLCALC" HgbA1c:  Lab Results  Component Value Date   HGBA1C 5.5 12/24/2022   Urine Drug Screen:     Component Value Date/Time    LABOPIA NONE DETECTED 12/07/2022 2246   COCAINSCRNUR NONE DETECTED 12/07/2022 2246   LABBENZ NONE DETECTED 12/07/2022 2246   AMPHETMU NONE DETECTED 12/07/2022 2246   THCU NONE DETECTED 12/07/2022 2246   LABBARB NONE DETECTED 12/07/2022 2246    Alcohol Level     Component Value Date/Time   ETH <10 12/14/2022 1845   INR No results found for: "INR" APTT No results found for: "APTT" AED levels:  Lab Results  Component Value Date   PHENYTOIN <2.5 (L) 06/27/2023    CT Head without contrast(Personally reviewed): CTH was negative for a large hypodensity concerning for a large territory infarct or hyperdensity concerning for an ICH   rEEG:  "This study showed generalized periodic discharges with triphasic morphology. This eeg pattern is typically due to toxic-metabolic causes. However, due to the frequency of discharges, it can be on the ictal-interictal continuum.    Additionally there is moderate to severe diffuse encephalopathy. No seizures  were seen throughout the recording."  Impression   Tarah [facility]ganious is a 57 y.o. female with hx of epilepsy, RA, GCA, ongoing tobacco use, Rx abuse p/w progressive AMS throughout the day 11/8, she was at her baseline the night PTA. She has not taken any of her AEDs over the last month and high suspicion for potential subclinical seizures given she presented similarly back in May of 2024 with confusion and found to be in focal motor status.   No noted clinical seizure activity on evaluation 06/28/23 or 06/29/23. EEG with severe diffuse encephalopathy and GPDs with triphasic morphology typically seen with toxic-metabolic causes but due to the frequency, can also be on the ictal-interictal continuum.   Recommendations  - Continue home Keppra 1000 mg BID - Continue home Vimpat 200 mg BID - Seizure precautions - IV ativan for clinical seizure activity and notify neurology - Continue overnight EEG  - Appreciate ongoing identification and  management of toxic-metabolic derangements per primary team  ______________________________________________________________________  Thank you for the opportunity to take part in the care of this patient. If you have any further questions, please contact the neurology consultation team on call. Updated oncall schedule is listed on AMION.  Signed,  Lanae Boast, AGACNP-BC [facility] Pager: [phone]   Attending Neurohospitalist Addendum Patient seen and examined with APP/Resident. Agree with the history and physical as documented above. Agree with the plan as documented, which I helped formulate. I have edited the note above to reflect my full findings and recommendations. I have independently reviewed the chart, obtained history, review of systems and examined the patient.I have personally reviewed pertinent head/neck/spine imaging (CT/MRI). Please feel free to call with any questions.  -- Bing Neighbors, MD [facility] [phone]  If 7pm- 7am, please page neurology on call as listed in AMION.

## 2023-06-29 NOTE — Plan of Care (Signed)
  Problem: Safety: Goal: Non-violent Restraint(s) Outcome: Progressing   Problem: Coping: Goal: Level of anxiety will decrease Outcome: Progressing   Problem: Pain Management: Goal: General experience of comfort will improve Outcome: Progressing

## 2023-06-29 NOTE — Progress Notes (Signed)
PROGRESS NOTE        PATIENT DETAILS Name: Abigail [name] Age: 58 y.o. Sex: female Date of Birth: [birth year] Admit Date: 06/28/2023 Admitting Physician Tereasa Coop, MD ZOX:WRUEAV, [name], MD  Brief Summary: Patient is a 57 y.o.  female with history of RA/GCA-no longer on prednisone, chronic pain on opiates, seizure disorder noncompliant with AEDs-presented with altered mental status-concern for subclinical seizures-transferred from  [name] to [facility] for LTM EEG.  Significant events: 11/8>> admit to [facility]  Significant studies: 11/7>> CXR: Bibasilar atelectasis 11/7>> CT head: No acute intracranial abnormality 11/8-11/9>> LTM EEG: No seizures 11/9>> LTM EEG: Periodic discharges with triphasic morphology  Significant microbiology data: None  Procedures: None  Consults: Neurology  Subjective: Confused-4 point restraints-awakes-looks at me but does not verbalize.  LTM EEG ongoing.  Looks like she vomited overnight-some vomitus at bedside.  Objective: Vitals: Blood pressure (!) 143/83, pulse 93, temperature 97.6 F (36.4 C), temperature source Axillary, resp. rate 17, SpO2 97%.   Exam: Gen Exam: Confused-not in any distress HEENT:atraumatic, normocephalic Chest: B/L clear to auscultation anteriorly CVS:S1S2 regular Abdomen:soft non tender, non distended Extremities:no edema Neurology: Difficult exam-in restraints-but seems to be moving all 4 extremities. Skin: no rash  Pertinent Labs/Radiology:    Latest Ref Rng & Units 06/28/2023    4:44 AM 06/27/2023   10:46 PM 12/25/2022    5:06 AM  CBC  WBC 4.0 - 10.5 K/uL 10.6  12.8  15.4   Hemoglobin 12.0 - 15.0 g/dL 40.9  81.1  91.4   Hematocrit 36.0 - 46.0 % 35.6  38.0  34.5   Platelets 150 - 400 K/uL 336  383  347     Lab Results  Component Value Date   NA 137 06/29/2023   K 3.6 06/29/2023   CL 97 (L) 06/29/2023   CO2 27 06/29/2023      Assessment/Plan: History of seizures with  breakthrough subclinical seizures secondary to noncompliance to AEDs-probably in status epilepticus LTM EEG in progress Still confused On Keppra/Vimpat Await neurology follow-up and recommendations Seizure precautions  Acute metabolic encephalopathy Likely due to above Concern for aspiration contributing to her mentation this morning-as she vomited earlier this morning.  Await chest x-ray Supportive care Keep n.p.o. till mentation improves further Elevate head end of bed Neurology following  Leukocytosis Downtrending Afebrile Monitor off antibiotics Vomited this morning-remains confused-some concern that she may have aspirated-await CXR.  History of RA/giant cell arteritis No longer on steroids-per last outpatient rheumatology note.  Chronic pain syndrome-on chronic narcotics Fibromyalgia All narcotics/Cymbalta on hold-given ongoing confusion/encephalopathy.  Chronic pancreatitis Benign abdominal exam-although she vomited this morning Supportive care for now Follow  BMI: Estimated body mass index is 26.25 kg/m as calculated from the following:   Height as of 06/03/23: 5\' 7"  (1.702 m).   Weight as of 06/03/23: 76 kg.   Code status:   Code Status: Full Code   DVT Prophylaxis: enoxaparin (LOVENOX) injection 40 mg Start: 06/28/23 1600 SCDs Start: 06/28/23 0404 Place TED hose Start: 06/28/23 0404    Family Communication: None at bedside   Disposition Plan: Status is: Inpatient Remains inpatient appropriate because: Severity of illness   Planned Discharge Destination:Home   Diet: Diet Order             Diet NPO time specified  Diet effective now  Antimicrobial agents: Anti-infectives (From admission, onward)    None        MEDICATIONS: Scheduled Meds:  enoxaparin (LOVENOX) injection  40 mg Subcutaneous Q24H   sodium chloride flush  3 mL Intravenous Q12H   Continuous Infusions:  lacosamide (VIMPAT) IV 200 mg (06/29/23  0922)   levETIRAcetam 1,000 mg (06/29/23 0827)   PRN Meds:.albuterol, dextrose, LORazepam, ondansetron **OR** ondansetron (ZOFRAN) IV, sodium chloride flush   I have personally reviewed following labs and imaging studies  LABORATORY DATA: CBC: Recent Labs  Lab 06/27/23 2246 06/28/23 0444  WBC 12.8* 10.6*  NEUTROABS 9.2*  --   HGB 12.4 11.5*  HCT 38.0 35.6*  MCV 91.6 91.5  PLT 383 336    Basic Metabolic Panel: Recent Labs  Lab 06/27/23 2246 06/28/23 0444 06/29/23 0732  NA 136 139 137  K 3.0* 3.2* 3.6  CL 88* 98 97*  CO2 36* 33* 27  GLUCOSE 108* 97 77  BUN 6 <5* 6  CREATININE 0.64 0.64 0.75  CALCIUM 8.1* 7.9* 8.3*  MG 2.0  --  1.9    GFR: CrCl cannot be calculated (Unknown ideal weight.).  Liver Function Tests: Recent Labs  Lab 06/27/23 2246 06/28/23 0444  AST 22 20  ALT 11 11  ALKPHOS 134* 111  BILITOT 0.6 0.7  PROT 7.2 6.0*  ALBUMIN 2.4* 1.9*   Recent Labs  Lab 06/27/23 2245  LIPASE 57*   Recent Labs  Lab 06/28/23 0444  AMMONIA <10    Coagulation Profile: No results for input(s): "INR", "PROTIME" in the last 168 hours.  Cardiac Enzymes: No results for input(s): "CKTOTAL", "CKMB", "CKMBINDEX", "TROPONINI" in the last 168 hours.  BNP (last 3 results) No results for input(s): "PROBNP" in the last 8760 hours.  Lipid Profile: No results for input(s): "CHOL", "HDL", "LDLCALC", "TRIG", "CHOLHDL", "LDLDIRECT" in the last 72 hours.  Thyroid Function Tests: No results for input(s): "TSH", "T4TOTAL", "FREET4", "T3FREE", "THYROIDAB" in the last 72 hours.  Anemia Panel: No results for input(s): "VITAMINB12", "FOLATE", "FERRITIN", "TIBC", "IRON", "RETICCTPCT" in the last 72 hours.  Urine analysis:    Component Value Date/Time   COLORURINE YELLOW 12/24/2022 1551   APPEARANCEUR CLEAR 12/24/2022 1551   APPEARANCEUR Clear 05/24/2014 1903   LABSPEC 1.012 12/24/2022 1551   LABSPEC 1.015 05/24/2014 1903   PHURINE 8.0 12/24/2022 1551   GLUCOSEU  NEGATIVE 12/24/2022 1551   GLUCOSEU Negative 05/24/2014 1903   HGBUR NEGATIVE 12/24/2022 1551   BILIRUBINUR NEGATIVE 12/24/2022 1551   BILIRUBINUR Negative 05/24/2014 1903   KETONESUR NEGATIVE 12/24/2022 1551   PROTEINUR NEGATIVE 12/24/2022 1551   NITRITE NEGATIVE 12/24/2022 1551   LEUKOCYTESUR NEGATIVE 12/24/2022 1551   LEUKOCYTESUR Negative 05/24/2014 1903    Sepsis Labs: Lactic Acid, Venous    Component Value Date/Time   LATICACIDVEN 1.9 12/07/2022 2051    MICROBIOLOGY: No results found for this or any previous visit (from the past 240 hour(s)).  RADIOLOGY STUDIES/RESULTS: Overnight EEG with video  Result Date: 06/28/2023 [name], MD     06/29/2023  8:16 AM Patient Name: Latiesha Goudeau [name] MRN: [id number] Epilepsy Attending: [name] Referring Physician/Provider: Erick Blinks, MD Duration: 06/28/2023 0430 to 06/29/2023 0430 Patient history:  57 y.o. female with hx of hx of epilepsy, RA, giant cell arteritis, prescription abuse p/w AMS throughout the day. EEG to evaluate for seizure Level of alertness: Awake, asleep AEDs during EEG study: LEV, LCM, Ativan Technical aspects: This EEG study was done with scalp electrodes positioned according to the  10-20 International system of electrode placement. Electrical activity was reviewed with band pass filter of 1-70Hz , sensitivity of 7 uV/mm, display speed of 37mm/sec with a 60Hz  notched filter applied as appropriate. EEG data were recorded continuously and digitally stored.  Video monitoring was available and reviewed as appropriate. Description: No posterior dominant rhythm was seen. Sleep was characterized by vertex waves, sleep spindles (12 to 14 Hz), maximal frontocentral region. EEG showed continuous generalized polymorphic sharply contoured 3 to 6 Hz theta-delta slowing. Generalized periodic discharges with triphasic morphology at 1-1.5 hz were also noted.  Hyperventilation and photic stimulation were not  performed.   ABNORMALITY - Periodic discharges with triphasic morphology, generalized - Continuous slow, generalized IMPRESSION: This study is suggestive of moderate to severe diffuse encephalopathy likely secondary to toxic-metabolic causes. No seizures or definite epileptiform discharges were seen throughout the recording. [name]   CT HEAD WO CONTRAST ( )  Result Date: 06/27/2023 CLINICAL DATA:  Mental status change, unknown cause EXAM: CT HEAD WITHOUT CONTRAST TECHNIQUE: Contiguous axial images were obtained from the base of the skull through the vertex without intravenous contrast. RADIATION DOSE REDUCTION: This exam was performed according to the departmental dose-optimization program which includes automated exposure control, adjustment of the mA and/or kV according to patient size and/or use of iterative reconstruction technique. COMPARISON:  Brain MRI 12/18/2022 FINDINGS: Brain: No intracranial hemorrhage, mass effect, or midline shift. No hydrocephalus. The basilar cisterns are patent. Minimal chronic white matter changes in the anterior limb of the internal capsular stable. No evidence of territorial infarct or acute ischemia. No extra-axial or intracranial fluid collection. Vascular: No hyperdense vessel or unexpected calcification. Skull: No fracture or focal lesion. Sinuses/Orbits: Chronic left maxillary mucosal thickening, only partially included. No acute findings. Other: None. IMPRESSION: No acute intracranial abnormality. Electronically Signed   By: Narda Rutherford M.D.   On: 06/27/2023 23:33   DG Chest Portable 1 View  Result Date: 06/27/2023 CLINICAL DATA:  Altered mental status. EXAM: PORTABLE CHEST 1 VIEW COMPARISON:  12/24/2022 FINDINGS: Low lung volumes persist. Normal heart size with stable mediastinal contours. Atelectasis at both lung basis without confluent airspace disease. No pleural effusion or pneumothorax. No acute osseous findings. IMPRESSION: Low lung volumes  with bibasilar atelectasis. Electronically Signed   By: Narda Rutherford M.D.   On: 06/27/2023 23:03     LOS: 1 day   Jeoffrey Massed, MD  [facility]    To contact the attending provider between 7A-7P or the covering provider during after hours 7P-7A, please log into the web site [email or web address] and access using universal [place] password for that web site. If you do not have the password, please call the [name] operator.  06/29/2023, 9:28 AM

## 2023-06-29 NOTE — Procedures (Signed)
Patient Name: [name]  MRN: [id number]  Epilepsy Attending: [name]  Referring Physician/Provider: Erick Blinks, MD  Duration: 06/29/2023 0430 to 06/29/2023 0730   Patient history:  57 y.o. female with hx of hx of epilepsy, RA, giant cell arteritis, prescription abuse p/w AMS throughout the day. EEG to evaluate for seizure   Level of alertness: Awake, asleep   AEDs during EEG study: LEV, LCM, Ativan   Technical aspects: This EEG study was done with scalp electrodes positioned according to the 10-20 International system of electrode placement. Electrical activity was reviewed with band pass filter of 1-70Hz , sensitivity of 7 uV/mm, display speed of 34mm/sec with a 60Hz  notched filter applied as appropriate. EEG data were recorded continuously and digitally stored.  Video monitoring was available and reviewed as appropriate.   Description: No posterior dominant rhythm was seen. Sleep was characterized by vertex waves, sleep spindles (12 to 14 Hz), maximal frontocentral region. EEG showed continuous generalized polymorphic sharply contoured 3 to 6 Hz theta-delta slowing. Generalized periodic discharges with triphasic morphology at 1-2 hz were also noted.  Hyperventilation and photic stimulation were not performed.      ABNORMALITY - Periodic discharges with triphasic morphology, generalized - Continuous slow, generalized   IMPRESSION: This study showed generalized periodic discharges with triphasic morphology. This eeg pattern is typically due to toxic-metabolic causes. However, due to the frequency of discharges, it can be on the ictal-interictal continuum.   Additionally there is moderate to severe diffuse encephalopathy. No seizures  were seen throughout the recording.   Abigail [name] A[facility]ail [name]

## 2023-06-29 NOTE — Plan of Care (Signed)
  Problem: Safety: Goal: Non-violent Restraint(s) Outcome: Progressing   Problem: Education: Goal: Knowledge of General Education information will improve Description: Including pain rating scale, medication(s)/side effects and non-pharmacologic comfort measures Outcome: Progressing   Problem: Clinical Measurements: Goal: Ability to maintain clinical measurements within normal limits will improve Outcome: Progressing Goal: Will remain free from infection Outcome: Progressing Goal: Diagnostic test results will improve Outcome: Progressing Goal: Cardiovascular complication will be avoided Outcome: Progressing   Problem: Activity: Goal: Risk for activity intolerance will decrease Outcome: Progressing   Problem: Nutrition: Goal: Adequate nutrition will be maintained Outcome: Progressing   Problem: Coping: Goal: Level of anxiety will decrease Outcome: Progressing   Problem: Elimination: Goal: Will not experience complications related to bowel motility Outcome: Progressing Goal: Will not experience complications related to urinary retention Outcome: Progressing   Problem: Pain Management: Goal: General experience of comfort will improve Outcome: Progressing   Problem: Safety: Goal: Ability to remain free from injury will improve Outcome: Progressing   Problem: Skin Integrity: Goal: Risk for impaired skin integrity will decrease Outcome: Progressing

## 2023-06-30 DIAGNOSIS — G40901 Epilepsy, unspecified, not intractable, with status epilepticus: Secondary | ICD-10-CM | POA: Diagnosis not present

## 2023-06-30 DIAGNOSIS — G9341 Metabolic encephalopathy: Secondary | ICD-10-CM | POA: Diagnosis not present

## 2023-06-30 DIAGNOSIS — K219 Gastro-esophageal reflux disease without esophagitis: Secondary | ICD-10-CM | POA: Diagnosis not present

## 2023-06-30 DIAGNOSIS — M069 Rheumatoid arthritis, unspecified: Secondary | ICD-10-CM | POA: Diagnosis not present

## 2023-06-30 LAB — CBC WITH DIFFERENTIAL/PLATELET
Abs Immature Granulocytes: 0.09 10*3/uL — ABNORMAL HIGH (ref 0.00–0.07)
Basophils Absolute: 0.1 10*3/uL (ref 0.0–0.1)
Basophils Relative: 0 %
Eosinophils Absolute: 0 10*3/uL (ref 0.0–0.5)
Eosinophils Relative: 0 %
HCT: 42.1 % (ref 36.0–46.0)
Hemoglobin: 13.4 g/dL (ref 12.0–15.0)
Immature Granulocytes: 1 %
Lymphocytes Relative: 13 %
Lymphs Abs: 1.8 10*3/uL (ref 0.7–4.0)
MCH: 29.1 pg (ref 26.0–34.0)
MCHC: 31.8 g/dL (ref 30.0–36.0)
MCV: 91.5 fL (ref 80.0–100.0)
Monocytes Absolute: 1 10*3/uL (ref 0.1–1.0)
Monocytes Relative: 7 %
Neutro Abs: 11 10*3/uL — ABNORMAL HIGH (ref 1.7–7.7)
Neutrophils Relative %: 79 %
Platelets: 372 10*3/uL (ref 150–400)
RBC: 4.6 MIL/uL (ref 3.87–5.11)
RDW: 14.8 % (ref 11.5–15.5)
WBC: 14 10*3/uL — ABNORMAL HIGH (ref 4.0–10.5)
nRBC: 0 % (ref 0.0–0.2)

## 2023-06-30 LAB — BASIC METABOLIC PANEL
Anion gap: 16 — ABNORMAL HIGH (ref 5–15)
BUN: 8 mg/dL (ref 6–20)
CO2: 22 mmol/L (ref 22–32)
Calcium: 8.2 mg/dL — ABNORMAL LOW (ref 8.9–10.3)
Chloride: 98 mmol/L (ref 98–111)
Creatinine, Ser: 0.67 mg/dL (ref 0.44–1.00)
GFR, Estimated: >60 mL/min (ref >60)
Glucose, Bld: 90 mg/dL (ref 70–99)
Potassium: 3 mmol/L — ABNORMAL LOW (ref 3.5–5.1)
Sodium: 136 mmol/L (ref 135–145)

## 2023-06-30 LAB — GLUCOSE, CAPILLARY
Glucose-Capillary: 91 mg/dL (ref 70–99)
Glucose-Capillary: 83 mg/dL (ref 70–99)
Glucose-Capillary: 116 mg/dL — ABNORMAL HIGH (ref 70–99)
Glucose-Capillary: 103 mg/dL — ABNORMAL HIGH (ref 70–99)
Glucose-Capillary: 107 mg/dL — ABNORMAL HIGH (ref 70–99)

## 2023-06-30 LAB — MRSA NEXT GEN BY PCR, NASAL: MRSA by PCR Next Gen: NOT DETECTED

## 2023-06-30 MED ORDER — CHLORHEXIDINE GLUCONATE CLOTH 2 % EX PADS
6.0000 | MEDICATED_PAD | Freq: Every day | CUTANEOUS | Status: DC
  Administered 2023-06-30 – 2023-07-04 (×6): 6 via TOPICAL

## 2023-06-30 MED ORDER — LEVETIRACETAM IN NACL 1500 MG/100ML IV SOLN
1500.0000 mg | Freq: Two times a day (BID) | INTRAVENOUS | Status: DC
  Administered 2023-06-30 – 2023-07-03 (×6): 1500 mg via INTRAVENOUS
  Filled 2023-06-30 (×7): qty 100

## 2023-06-30 MED ORDER — PHENYTOIN SODIUM 50 MG/ML IJ SOLN
100.0000 mg | Freq: Three times a day (TID) | INTRAMUSCULAR | Status: DC
  Administered 2023-06-30 – 2023-07-03 (×8): 100 mg via INTRAVENOUS
  Filled 2023-06-30 (×8): qty 2

## 2023-06-30 MED ORDER — LORAZEPAM 2 MG/ML IJ SOLN
2.0000 mg | INTRAMUSCULAR | Status: AC
  Administered 2023-06-30: 2 mg via INTRAVENOUS

## 2023-06-30 MED ORDER — POTASSIUM CHLORIDE 10 MEQ/100ML IV SOLN
10.0000 meq | INTRAVENOUS | Status: AC
  Administered 2023-06-30 (×3): 10 meq via INTRAVENOUS
  Filled 2023-06-30 (×3): qty 100

## 2023-06-30 MED ORDER — METOCLOPRAMIDE HCL 5 MG/ML IJ SOLN
5.0000 mg | Freq: Two times a day (BID) | INTRAMUSCULAR | Status: AC
  Administered 2023-06-30 (×2): 5 mg via INTRAVENOUS
  Filled 2023-06-30 (×2): qty 2

## 2023-06-30 MED ORDER — SODIUM CHLORIDE 0.9 % IV SOLN
15.0000 mg/kg | Freq: Once | INTRAVENOUS | Status: AC
  Administered 2023-06-30: 1140 mg via INTRAVENOUS
  Filled 2023-06-30: qty 22.8

## 2023-06-30 MED ORDER — LEVETIRACETAM IN NACL 500 MG/100ML IV SOLN
500.0000 mg | Freq: Once | INTRAVENOUS | Status: AC
  Administered 2023-06-30: 500 mg via INTRAVENOUS
  Filled 2023-06-30: qty 100

## 2023-06-30 MED ORDER — SODIUM CHLORIDE 0.9 % IV SOLN: INTRAVENOUS | Status: AC

## 2023-06-30 NOTE — Procedures (Addendum)
Patient Name: Abigail [name]  MRN: [id number]  Epilepsy Attending: [name]  Referring Physician/Provider: Erick Blinks, MD  Duration: 06/30/2023 0430 to 07/01/2023 0430   Patient history:  57 y.o. female with hx of hx of epilepsy, RA, giant cell arteritis, prescription abuse p/w AMS throughout the day. EEG to evaluate for seizure   Level of alertness: Awake, asleep   AEDs during EEG study: LEV, LCM, PHT, Ativan   Technical aspects: This EEG study was done with scalp electrodes positioned according to the 10-20 International system of electrode placement. Electrical activity was reviewed with band pass filter of 1-70Hz , sensitivity of 7 uV/mm, display speed of 24mm/sec with a 60Hz  notched filter applied as appropriate. EEG data were recorded continuously and digitally stored.  Video monitoring was available and reviewed as appropriate.   Description: No posterior dominant rhythm was seen. Sleep was characterized by vertex waves, sleep spindles (12 to 14 Hz), maximal frontocentral region. EEG showed continuous generalized polymorphic sharply contoured 3 to 6 Hz theta-delta slowing. Generalized periodic discharges with triphasic morphology at 1-2 hz were also noted, more frequent when awake/stimulated. Lateralized periodic discharges ( LPDs) were noted in right parieto-occipital region, qasi periodic at 0.5Hz . At times, overriding fast activity was noted with LPDs in right parieto-occipital region without definite evolution lasting about 15-30 seconds. On video, patient was laying in bed with eyes closed, no clinical signs were noted.  However, later during the day, patient was awake and noted to have rightward and downward gaze deviation and concomitant EEG showed lateralized periodic discharges with overriding fast activity and right parieto-occipital region.  This EEG pattern is therefore concerning for focal motor seizures.  Antiseizure medications were adjusted and  subsequently after 1130 on 06/30/2023 no further seizures were noted.  Hyperventilation and photic stimulation were not performed.      ABNORMALITY -Focal motor seizure, right parieto-occipital region - Lateralized periodic discharges with overriding fast activity ( LPD+F), right parieto-occipital region - Periodic discharges with triphasic morphology, generalized - Continuous slow, generalized   IMPRESSION: This study showed focal motor seizures during which patient was noted to have rightward and downward gaze deviation.  Antiseizure medications were adjusted and subsequently after around 1030 on 06/30/2023 no further seizures were noted.  EEG continued to show evidence of epileptogenicity arising from  right parieto-occipital region with increased risk of seizures.   Additionally EEG  showed generalized periodic discharges with triphasic morphology. This eeg pattern is typically due to toxic-metabolic causes. However, due to the frequency of discharges it can be on the ictal-interictal continuum.    Lastly there was moderate to severe diffuse encephalopathy.     [name]

## 2023-06-30 NOTE — Consult Note (Signed)
NAME:  Abigail [name], MRN:  [id number], DOB:  1966/04/05, LOS: 2 ADMISSION DATE:  06/28/2023, CONSULTATION DATE:  06/30/2023 REFERRING MD:  Dr. Jerral Ralph -TR[facility] , CHIEF COMPLAINT:  Break through seizures    History of Present Illness:  Abigail [name] is a 57 y.o. female with past medical history significant for giant cell arteritis no longer on steroids, seizure disorder noncompliant with AEDs, basal cell carcinoma, HTN, HLD, fibromyalgia, and anxiety who presented to the ED 11/8 with altered mental status concerning for seizures. Patient was admitted per [facility] with neuro consult.   Mentation continues to wax and wane with breakthrough seizures seen on LTM.  Vomiting episode morning of 11/10 with gurgling respirations post seizure,  PCCM consulted.   Pertinent  Medical History  Giant cell arteritis no longer on steroids, seizure disorder noncompliant with AEDs, basal cell carcinoma, HTN, HLD, fibromyalgia, and anxiety  Significant Hospital Events: Including procedures, antibiotic start and stop dates in addition to other pertinent events   11/09 Presented with AMS in the setting of breakthrough seizures  11/11 Continued breakthrough seizures, vomiting episode   Interim History / Subjective:  As Above   Objective   Blood pressure (!) 141/70, pulse 76, temperature (!) 97 F (36.1 C), temperature source Axillary, resp. rate (!) 24, SpO2 100%.        Intake/Output Summary (Last 24 hours) at 06/30/2023 1442 Last data filed at 06/29/2023 2100 Gross per 24 hour  Intake --  Output 500 ml  Net -500 ml   There were no vitals filed for this visit.  Examination: Per attending   Resolved Hospital Problem list     Assessment & Plan:  Seizure disorder with breakthrough seizures  -Likely secondary to noncompliance  Acute metabolic encephalopathy  P: Management per neurology Neuro following appreciate assistance  Maintain neuro protective measures Nutrition and bowel  regiment  Seizure precautions  AEDs per neurology  Aspirations precautions  LTM per Neuro  At risk respiratory decompensation  Possible aspiration  P: Transfer to ICU for close monitoring  Aspiration precautions  Head of bed elevated 30 degrees. Follow intermittent chest x-ray and as needed ABG Ensure adequate pulmonary hygiene  As needed NTS suctioning   Chronic pain syndrome/fibromyalgia Chronic narcotic use  P: Hold home narcotics   Chronic pancreatitis  P: Supportive care    Best Practice (right click and "Reselect all SmartList Selections" daily)   Diet/type: NPO DVT prophylaxis: LMWH GI prophylaxis: N/A and PPI Lines: N/A Foley:  N/A Code Status:  full code Last date of multidisciplinary goals of care discussion: Pending   Labs   CBC: Recent Labs  Lab 06/27/23 2246 06/28/23 0444 06/30/23 0329  WBC 12.8* 10.6* 14.0*  NEUTROABS 9.2*  --  11.0*  HGB 12.4 11.5* 13.4  HCT 38.0 35.6* 42.1  MCV 91.6 91.5 91.5  PLT 383 336 372    Basic Metabolic Panel: Recent Labs  Lab 06/27/23 2246 06/28/23 0444 06/29/23 0732 06/30/23 0329  NA 136 139 137 136  K 3.0* 3.2* 3.6 3.0*  CL 88* 98 97* 98  CO2 36* 33* 27 22  GLUCOSE 108* 97 77 90  BUN 6 <5* 6 8  CREATININE 0.64 0.64 0.75 0.67  CALCIUM 8.1* 7.9* 8.3* 8.2*  MG 2.0  --  1.9  --    GFR: CrCl cannot be calculated (Unknown ideal weight.). Recent Labs  Lab 06/27/23 2246 06/28/23 0444 06/30/23 0329  WBC 12.8* 10.6* 14.0*    Liver Function Tests:  Recent Labs  Lab 06/27/23 2246 06/28/23 0444  AST 22 20  ALT 11 11  ALKPHOS 134* 111  BILITOT 0.6 0.7  PROT 7.2 6.0*  ALBUMIN 2.4* 1.9*   Recent Labs  Lab 06/27/23 2245  LIPASE 57*   Recent Labs  Lab 06/28/23 0444  AMMONIA <10    ABG    Component Value Date/Time   PHART 7.47 (H) 12/18/2022 0902   PCO2ART 44 12/18/2022 0902   PO2ART 59 (L) 12/18/2022 0902   HCO3 47.9 (H) 06/27/2023 2246   O2SAT 45.7 06/27/2023 2246     Coagulation  Profile: No results for input(s): "INR", "PROTIME" in the last 168 hours.  Cardiac Enzymes: No results for input(s): "CKTOTAL", "CKMB", "CKMBINDEX", "TROPONINI" in the last 168 hours.  HbA1C: Hgb A1c MFr Bld  Date/Time Value Ref Range Status  12/24/2022 03:45 AM 5.5 4.8 - 5.6 % Final    Comment:    (NOTE) Pre diabetes:          5.7%-6.4%  Diabetes:              >6.4%  Glycemic control for   <7.0% adults with diabetes     CBG: Recent Labs  Lab 06/29/23 2212 06/30/23 0204 06/30/23 0351 06/30/23 0827 06/30/23 1224  GLUCAP 99 91 83 116* 103*    Review of Systems:   Unable to assess   Past Medical History:  She,  has a past medical history of Anxiety, Arthritis, Back pain, Basal cell carcinoma (10/04/2021), Basal cell carcinoma (10/04/2021), Diarrhea (11/12/2016), Fibromyalgia, Generalized abdominal pain (11/12/2016), H. pylori infection, Hyperlipidemia, IBS (irritable bowel syndrome), Infectious colitis (04/29/2016), Migraines, Moderate dehydration (04/29/2016), Muscle pain, Opioid overdose (HCC) (12/07/2022), Reflux, and Unexplained weight loss (11/12/2016).   Surgical History:   Past Surgical History:  Procedure Laterality Date   ABDOMINAL HYSTERECTOMY     APPENDECTOMY  2009   C5 FUSION     C6 FUSION     C7 FUSION     COLONOSCOPY  02/2006   COLONOSCOPY WITH PROPOFOL N/A 12/25/2016   Procedure: COLONOSCOPY WITH PROPOFOL;  Surgeon: Earline Mayotte, MD;  Location: [facility] ENDOSCOPY;  Service: Endoscopy;  Laterality: N/A;   ESOPHAGOGASTRODUODENOSCOPY (EGD) WITH PROPOFOL N/A 12/25/2016   Procedure: ESOPHAGOGASTRODUODENOSCOPY (EGD) WITH PROPOFOL;  Surgeon: Earline Mayotte, MD;  Location: [facility] ENDOSCOPY;  Service: Endoscopy;  Laterality: N/A;   FOOT SURGERY     HIP ARTHROPLASTY     L4 FUSION     L5 FUSION     S1 FUSION       Social History:   reports that she has been smoking cigarettes. She has never used smokeless tobacco. She reports current alcohol use. She  reports that she does not use drugs.   Family History:  Her family history is not on file.   Allergies Allergies  Allergen Reactions   Nsaids Hives   Tapentadol Swelling, Rash and Other (See Comments)    Nucynta- Made her deathly sick   Cephalexin Other (See Comments)    Reaction not cited   Codeine Other (See Comments)    Reaction not cited   Darvocet [Propoxyphene N-Acetaminophen] Other (See Comments)    Reaction not cited   Latex Other (See Comments)    Reaction not cited   Silicone Other (See Comments)    Reaction not cited   Sulfa Antibiotics Hives   Tape Other (See Comments)    Reaction not cited   Meloxicam Rash     Home Medications  Prior to  Admission medications   Medication Sig Start Date End Date Taking? Authorizing Provider  albuterol (VENTOLIN HFA) 108 (90 Base) MCG/ACT inhaler Inhale 2 puffs into the lungs every 6 (six) hours as needed for wheezing or shortness of breath. 12/21/22  Yes [provider]  BC FAST PAIN RELIEF 845-65 MG PACK Take 1 packet by mouth 2 (two) times daily as needed (for headaches or pain).   Yes [provider]  Cholecalciferol (VITAMIN D3) 50 MCG (2000 UT) TABS Take 50 mcg by mouth daily.   Yes [provider]  Cyclobenzaprine HCl (FLEXERIL PO) Take 10 tablets by mouth at bedtime.   Yes [provider]  DULoxetine (CYMBALTA) 30 MG capsule Take 1 capsule (30 mg total) by mouth daily. Patient taking differently: Take 60 mg by mouth 2 (two) times daily. 12/26/22  Yes Lonia Blood, MD  folic acid (FOLVITE) 1 MG tablet Take 1 mg by mouth daily.   Yes [provider]  gabapentin (NEURONTIN) 300 MG capsule Take 300 mg by mouth 4 (four) times daily. Take 2 capsules by mouth in the morning, 1 capsule at noon, 2 capsules in the evening 05/31/23  Yes [provider]  hydrOXYzine (ATARAX) 25 MG tablet Take 1 tablet (25 mg total) by mouth 3 (three) times daily as needed for itching or anxiety.  12/25/22  Yes Alwyn Ren, MD  levETIRAcetam (KEPPRA) 1000 MG tablet Take 2 tablets (2,000 mg total) by mouth 2 (two) times daily. 12/26/22  Yes Lonia Blood, MD  Magnesium 250 MG TABS Take 250 mg by mouth at bedtime.   Yes [provider]  megestrol (MEGACE) 40 MG/ML suspension Take 200 mg by mouth daily as needed (for appetite). 03/25/23  Yes [provider]  Melatonin 10 MG TABS Take 10 mg by mouth at bedtime.   Yes [provider]  Multiple Vitamin (MULTIVITAMIN WITH MINERALS) TABS tablet Take 1 tablet by mouth daily. 12/26/22  Yes Kathlen Mody, MD  oxyCODONE (ROXICODONE) 15 MG immediate release tablet Take 15 mg by mouth 5 (five) times daily as needed for pain. 05/09/23  Yes [provider]  pantoprazole (PROTONIX) 40 MG tablet Take 40 mg by mouth 2 (two) times daily before a meal.   Yes [provider]  potassium chloride (KLOR-CON) 10 MEQ tablet Take 10 mEq by mouth daily. 08/11/22  Yes [provider]  thiamine (VITAMIN B1) 100 MG tablet Take 1 tablet (100 mg total) by mouth daily. 12/25/22  Yes Kathlen Mody, MD  vitamin B-12 (CYANOCOBALAMIN) 500 MCG tablet Take 500 mcg by mouth daily.   Yes [provider]  lacosamide (VIMPAT) 200 MG TABS tablet Take 1 tablet (200 mg total) by mouth 2 (two) times daily. Patient not taking: Reported on 06/28/2023 12/25/22   Kathlen Mody, MD  modafinil (PROVIGIL) 200 MG tablet Take 200 mg by mouth daily. 05/31/23   [provider]  phenytoin (DILANTIN) 100 MG ER capsule Take 1 capsule (100 mg total) by mouth 2 (two) times daily. Patient not taking: Reported on 06/28/2023 12/25/22   Kathlen Mody, MD     Critical care time:   CRITICAL CARE Performed by: Michille Mcelrath D. Harris   Total critical care time: 42 minutes  Critical care time was exclusive of separately billable procedures and treating other patients.  Critical care was necessary to treat or prevent imminent or  life-threatening deterioration.  Critical care was time spent personally by me on the following activities: development of treatment plan  with patient and/or surrogate as well as nursing, discussions with consultants, evaluation of patient's response to treatment, examination of patient, obtaining history from patient or surrogate, ordering and performing treatments and interventions, ordering and review of laboratory studies, ordering and review of radiographic studies, pulse oximetry and re-evaluation of patient's condition.  Anuja Manka D. Harris, NP-C [facility] Personal contact information can be found on Amion  If no contact or response made please call [phone] 06/30/2023, 3:09 PM

## 2023-06-30 NOTE — Progress Notes (Signed)
Pharmacy Phenytoin Note  [facility] is a 57 y.o. female admitted on 06/28/2023 with  status epilepticus .  Pharmacy has been consulted for phenytoin dosing.  IBW: 62kg  Actual body weight: 76kg (from 06/03/2023)  Patient is a 57 y.o. female with seizure disorder noncompliant with AEDs presenting with altered mental status with concern for seizures. She was transferred from [facility] to [facility]. MD Selina Cooley ordered an IV fosphenytoin load of 1140 mg (~18mg /kg IBW).   Will plan to initiate maintenance dosing with phenytoin IV and dose using IBW, 3-5 mg/kg/day. Will defer maintenance dosing to start 8-12 hours post IV load per our protocol. Of note, patient has chronically low albumin. Will need to adjust any levels drawn or order free phenytoin levels.   Plan: Start phenytoin 100mg  IV TID  Follow up to obtain updated weight  Monitor for seizure control  Consider levels once at steady state (5-7 days)     CrCl cannot be calculated (Unknown ideal weight.).    Allergies  Allergen Reactions   Nsaids Hives   Tapentadol Swelling, Rash and Other (See Comments)    Nucynta- Made her deathly sick   Cephalexin Other (See Comments)    Reaction not cited   Codeine Other (See Comments)    Reaction not cited   Darvocet [Propoxyphene N-Acetaminophen] Other (See Comments)    Reaction not cited   Latex Other (See Comments)    Reaction not cited   Silicone Other (See Comments)    Reaction not cited   Sulfa Antibiotics Hives   Tape Other (See Comments)    Reaction not cited   Meloxicam Rash     Thank you for allowing pharmacy to be a part of this patient's care.  Blane Ohara, PharmD  PGY2 Pharmacy Resident

## 2023-06-30 NOTE — Progress Notes (Signed)
NEUROLOGY CONSULT FOLLOW UP NOTE   Date of service: June 30, 2023 Patient Name: Abigail [name] [facility] MRN:  [id number] DOB:  Oct 27, 1965  Brief HPI  Abigail [name] is a 57 y.o. female with hx of hx of epilepsy, RA, GCA, ongoing tobacco use, Rx abuse p/w AMS throughout the day 06/28/23. Son at bedside reported that she was at her baseline the night PTA (06/27/23). She mentioned a few days PTA that she felt like seizures coming on. She has not taken seizure meds in a month. She ran out of Vimpat and never requested for refill. Son reports that earlier she ran out of Dilantin and Keppra and per neurology clinic notes from Dr. Sandria Senter shah, was maintained off of it. Patient's confusion progressed throughout the day and she was taken to [facility] for evaluation.  Initial lab work obtained without significant metabolic abnormalities, CTH was negative, she was loaded with 4,500 mg of Keppra and transferred to [facility] for cEEG.    Interval Hx/subjective  Patient was noted to have first downward and rightward gaze deviation on exam this morning.  EEG revealed seizure activity at that time, which improved with 2 mg of Ativan given, gaze returned to midline.  Vitals   Vitals:   06/30/23 0000 06/30/23 0001 06/30/23 0355 06/30/23 0800  BP: (!) 165/81  (!) 145/78 (!) 155/78  Pulse: 100  94 97  Resp: (!) 23 20 20 20   Temp:   98.1 F (36.7 C) 98.7 F (37.1 C)  TempSrc:    Axillary  SpO2: 100%  100%     There is no height or weight on file to calculate BMI.  Physical Exam   Constitutional: Ill-appearing patient, intermittently restless and soft wrist restraints Psych: Unable to assess due to patient's condition.  Eyes: No scleral injection.  HENT: No OP obstrucion.  Head: Normocephalic and atraumatic Cardiovascular: Normal rate and regular rhythm.  Respiratory: Effort normal, non-labored breathing on room air. Symmetric chest rise.  Skin: WDI.   Neurologic Examination  Mental  status/Cognition: Patient rests with eyes closed, does not focus or track examiner, no verbal output, does not follow commands but moves all 4 extremities to sternal rub Speech/language: No verbal output, no vocalizations with noxious stimuli Cranial nerves:   CN II Pupils equal and reactive to light with passive eye opening   CN III,IV,VI Gaze first noted to be deviated downwards and then to the right, after Ativan administration, gaze midline   CN V    CN VII Face appears symmetrical at rest   CN VIII Does not open eyes or make significant eye contact to speech   CN IX & X Protecting her airway   CN XI Moves her head left to right as she thrashes around.   CN XII Does not protrude tongue     Sensory/Motor:  Muscle bulk: normal, tone normal Intermittent restless movements of all 4 extremities, antigravity strength in bilateral upper extremities but does not pick up bilateral lower extremities off the bed   Coordination/Complex Motor:  Unable to assess as patient does not perform.   Labs and Diagnostic Imaging   CBC:  Recent Labs  Lab 06/27/23 2246 06/28/23 0444 06/30/23 0329  WBC 12.8* 10.6* 14.0*  NEUTROABS 9.2*  --  11.0*  HGB 12.4 11.5* 13.4  HCT 38.0 35.6* 42.1  MCV 91.6 91.5 91.5  PLT 383 336 372    Basic Metabolic Panel:  Lab Results  Component Value Date   NA 136 06/30/2023  K 3.0 (L) 06/30/2023   CO2 22 06/30/2023   GLUCOSE 90 06/30/2023   BUN 8 06/30/2023   CREATININE 0.67 06/30/2023   CALCIUM 8.2 (L) 06/30/2023   GFRNONAA >60 06/30/2023   GFRAA >60 01/15/2015   Lipid Panel: No results found for: "LDLCALC" HgbA1c:  Lab Results  Component Value Date   HGBA1C 5.5 12/24/2022   Urine Drug Screen:     Component Value Date/Time   LABOPIA NONE DETECTED 12/07/2022 2246   COCAINSCRNUR NONE DETECTED 12/07/2022 2246   LABBENZ NONE DETECTED 12/07/2022 2246   AMPHETMU NONE DETECTED 12/07/2022 2246   THCU NONE DETECTED 12/07/2022 2246   LABBARB NONE DETECTED  12/07/2022 2246    Alcohol Level     Component Value Date/Time   ETH <10 12/14/2022 1845   INR No results found for: "INR" APTT No results found for: "APTT" AED levels:  Lab Results  Component Value Date   PHENYTOIN <2.5 (L) 06/27/2023    CT Head without contrast(Personally reviewed): CTH was negative for a large hypodensity concerning for a large territory infarct or hyperdensity concerning for an ICH   rEEG:  "This study showed generalized periodic discharges with triphasic morphology. This eeg pattern is typically due to toxic-metabolic causes. However, due to the frequency of discharges, it can be on the ictal-interictal continuum.    Additionally there is moderate to severe diffuse encephalopathy. No seizures  were seen throughout the recording."  EEG 11/10: Lateralized periodic discharges with overriding fast activity in right parieto-occipital region, periodic discharges with triphasic morphology, generalized, showing evidence of epileptogenicity arising from right parieto-occipital region with increased risk of seizures with GPD's with triphasic morphology, typically this pattern is due to toxic metabolic causes. Seizure seen on EEG around 11:15 AM when gaze deviation noted  Impression   Abigail [name] is a 57 y.o. female with hx of epilepsy, RA, GCA, ongoing tobacco use, Rx abuse p/w progressive AMS throughout the day 11/8, she was at her baseline the night PTA. She has not taken any of her AEDs over the last month and high suspicion for potential subclinical seizures given she presented similarly back in May of 2024 with confusion and found to be in focal motor status.   No noted clinical seizure activity on evaluation 06/28/23 or 06/29/23. EEG with severe diffuse encephalopathy and GPDs with triphasic morphology typically seen with toxic-metabolic causes but due to the frequency, can also be on the ictal-interictal continuum.   EEG on 11/10 continues to show  epileptogenicity with LPD's with overriding fast activity in right parietal occipital region.  Gaze deviation was noted on exam, and at that time seizure was seen on EEG.  This resolved with 2 mg of IV Ativan being administered, will add back Dilantin.  Recommendations  -Increase Keppra to 1500 mg twice daily - Continue home Vimpat 200 mg BID - Load fosphenytoin and start maint phenytoin per pharm consult - Seizure precautions - IV ativan for clinical seizure activity and notify neurology - Continue overnight EEG  - Appreciate ongoing identification and management of toxic-metabolic derangements per primary team  ______________________________________________________________________  Patient seen by NP and then by MD, MD to edit note as needed  Thank you for the opportunity to take part in the care of this patient. If you have any further questions, please contact the neurology consultation team on call. Updated oncall schedule is listed on AMION.  Signed,  Cortney E Ernestina Columbia , MSN, AGACNP-BC [facility] See Amion for schedule  and pager information 06/30/2023 11:37 AM     Attending Neurohospitalist Addendum Patient seen and examined with APP/Resident. Agree with the history and physical as documented above. Agree with the plan as documented, which I helped formulate. I have edited the note above to reflect my full findings and recommendations. I have independently reviewed the chart, obtained history, review of systems and examined the patient.I have personally reviewed pertinent head/neck/spine imaging (CT/MRI). Please feel free to call with any questions.  Episode of R gaze deviation was accompanied by periodic lateralized discharges with overriding fast activity and evolution c/f seizure. Frequency of discharges prior to this intermittently up to 2.5 Hz. Adding phenytoin as a 3rd agent. Subsequently transferred to ICU for close monitoring 2/2 poor mental status and  accumulation of secretions; she does not require intubation at this time but status is tenuous.  This patient is critically ill and at significant risk of neurological worsening, death and care requires constant monitoring of vital signs, hemodynamics,respiratory and cardiac monitoring, neurological assessment, discussion with family, other specialists and medical decision making of high complexity. I spent 45 minutes of neurocritical care time  in the care of  this patient. This was time spent independent of any time provided by nurse practitioner or PA.  Bing Neighbors, MD [facility] [phone]  If 7pm- 7am, please page neurology on call as listed in AMION.

## 2023-06-30 NOTE — Progress Notes (Addendum)
PROGRESS NOTE        PATIENT DETAILS Name: Abigail [name] [facility] Age: 57 y.o. Sex: female Date of Birth: Jun 30, 1966 Admit Date: 06/28/2023 Admitting Physician Tereasa Coop, MD [id number], [name], MD  Brief Summary: Patient is a 57 y.o.  female with history of RA/GCA-no longer on prednisone, chronic pain on opiates, seizure disorder noncompliant with AEDs-presented with altered mental status-concern for subclinical seizures-transferred from  [facility] to [facility] for LTM EEG.  Significant events: 11/8>> admit to [facility]  Significant studies: 11/7>> CXR: Bibasilar atelectasis 11/7>> CT head: No acute intracranial abnormality 11/8-11/9>> LTM EEG: No seizures 11/9>> LTM EEG: Periodic discharges with triphasic morphology 11/10>> LTM EEG: Epileptogenicity arising from right parieto-occipital area.  Significant microbiology data: None  Procedures: None  Consults: Neurology  Subjective: Still lethargic-moving all 4 extremities spontaneously.  Will push me away-shout out when I do a sternal rub.  Objective: Vitals: Blood pressure (!) 155/78, pulse 97, temperature 98.7 F (37.1 C), temperature source Axillary, resp. rate 20, SpO2 100%.   Exam: Not in any distress-confused Atraumatic/normocephalic Chest: Clear to auscultation Abdomen: Soft nontender nondistended Extremities: No edema Neurology Difficult exam but moving all 4 extremities.   Pertinent Labs/Radiology:    Latest Ref Rng & Units 06/30/2023    3:29 AM 06/28/2023    4:44 AM 06/27/2023   10:46 PM  CBC  WBC 4.0 - 10.5 K/uL 14.0  10.6  12.8   Hemoglobin 12.0 - 15.0 g/dL 19.1  47.8  29.5   Hematocrit 36.0 - 46.0 % 42.1  35.6  38.0   Platelets 150 - 400 K/uL 372  336  383     Lab Results  Component Value Date   NA 136 06/30/2023   K 3.0 (L) 06/30/2023   CL 98 06/30/2023   CO2 22 06/30/2023      Assessment/Plan: History of seizures with breakthrough subclinical seizures secondary to  noncompliance to AEDs-probably in status epilepticus LTM EEG in progress-showing epileptogenicity arising from right parieto-occipital region Mental status remains unchanged-although a bit more awake still lethargic-not following commands Keppra/Vimpat Seizure precautions Await recommendations from neurology.  Acute metabolic encephalopathy Likely due to above Had vomited 11/9 but lung sounds/CXR stable Continue to watch airway closely-discussed with nursing staff-elevate head end of bed. Keep n.p.o. till mentation improves For now seems to be protecting airway adequately.  Addendum Still pretty lethargic-but now accumulating secretions-oxygen requirement currently is minimal-discussed with PCCM-transferring to 4 N. for close observation.  Leukocytosis Overall better but still fluctuating Afebrile Monitor off antibiotics No evidence of pneumonia on CXR 11/9.  History of RA/giant cell arteritis No longer on steroids-per last outpatient rheumatology note.  Chronic pain syndrome-on chronic narcotics Fibromyalgia All narcotics/Cymbalta on hold-given ongoing confusion/encephalopathy.  Chronic pancreatitis Benign abdominal exam-although she vomited this morning Supportive care for now Follow  BMI: Estimated body mass index is 26.25 kg/m as calculated from the following:   Height as of 06/03/23: 5\' 7"  (1.702 m).   Weight as of 06/03/23: 76 kg.   Code status:   Code Status: Full Code   DVT Prophylaxis: enoxaparin (LOVENOX) injection 40 mg Start: 06/28/23 1600 SCDs Start: 06/28/23 0404 Place TED hose Start: 06/28/23 0404    Family Communication:  Mother-Barbara [phone]-left voicemail 11/10 Addendum-Barbara-picked up the phone this afternoon-she was updated.  Disposition Plan: Status is: Inpatient Remains inpatient appropriate because: Severity of illness  Planned Discharge Destination:Home   Diet: Diet Order             Diet NPO time specified   Diet effective now                     Antimicrobial agents: Anti-infectives (From admission, onward)    None        MEDICATIONS: Scheduled Meds:  enoxaparin (LOVENOX) injection  40 mg Subcutaneous Q24H   sodium chloride flush  3 mL Intravenous Q12H   Continuous Infusions:  lacosamide (VIMPAT) IV 200 mg (06/29/23 2124)   levETIRAcetam 1,000 mg (06/29/23 2106)   potassium chloride 10 mEq (06/30/23 1002)   PRN Meds:.albuterol, dextrose, LORazepam, ondansetron **OR** ondansetron (ZOFRAN) IV, sodium chloride flush   I have personally reviewed following labs and imaging studies  LABORATORY DATA: CBC: Recent Labs  Lab 06/27/23 2246 06/28/23 0444 06/30/23 0329  WBC 12.8* 10.6* 14.0*  NEUTROABS 9.2*  --  11.0*  HGB 12.4 11.5* 13.4  HCT 38.0 35.6* 42.1  MCV 91.6 91.5 91.5  PLT 383 336 372    Basic Metabolic Panel: Recent Labs  Lab 06/27/23 2246 06/28/23 0444 06/29/23 0732 06/30/23 0329  NA 136 139 137 136  K 3.0* 3.2* 3.6 3.0*  CL 88* 98 97* 98  CO2 36* 33* 27 22  GLUCOSE 108* 97 77 90  BUN 6 <5* 6 8  CREATININE 0.64 0.64 0.75 0.67  CALCIUM 8.1* 7.9* 8.3* 8.2*  MG 2.0  --  1.9  --     GFR: CrCl cannot be calculated (Unknown ideal weight.).  Liver Function Tests: Recent Labs  Lab 06/27/23 2246 06/28/23 0444  AST 22 20  ALT 11 11  ALKPHOS 134* 111  BILITOT 0.6 0.7  PROT 7.2 6.0*  ALBUMIN 2.4* 1.9*   Recent Labs  Lab 06/27/23 2245  LIPASE 57*   Recent Labs  Lab 06/28/23 0444  AMMONIA <10    Coagulation Profile: No results for input(s): "INR", "PROTIME" in the last 168 hours.  Cardiac Enzymes: No results for input(s): "CKTOTAL", "CKMB", "CKMBINDEX", "TROPONINI" in the last 168 hours.  BNP (last 3 results) No results for input(s): "PROBNP" in the last 8760 hours.  Lipid Profile: No results for input(s): "CHOL", "HDL", "LDLCALC", "TRIG", "CHOLHDL", "LDLDIRECT" in the last 72 hours.  Thyroid Function Tests: No results for  input(s): "TSH", "T4TOTAL", "FREET4", "T3FREE", "THYROIDAB" in the last 72 hours.  Anemia Panel: No results for input(s): "VITAMINB12", "FOLATE", "FERRITIN", "TIBC", "IRON", "RETICCTPCT" in the last 72 hours.  Urine analysis:    Component Value Date/Time   COLORURINE YELLOW 12/24/2022 1551   APPEARANCEUR CLEAR 12/24/2022 1551   APPEARANCEUR Clear 05/24/2014 1903   LABSPEC 1.012 12/24/2022 1551   LABSPEC 1.015 05/24/2014 1903   PHURINE 8.0 12/24/2022 1551   GLUCOSEU NEGATIVE 12/24/2022 1551   GLUCOSEU Negative 05/24/2014 1903   HGBUR NEGATIVE 12/24/2022 1551   BILIRUBINUR NEGATIVE 12/24/2022 1551   BILIRUBINUR Negative 05/24/2014 1903   KETONESUR NEGATIVE 12/24/2022 1551   PROTEINUR NEGATIVE 12/24/2022 1551   NITRITE NEGATIVE 12/24/2022 1551   LEUKOCYTESUR NEGATIVE 12/24/2022 1551   LEUKOCYTESUR Negative 05/24/2014 1903    Sepsis Labs: Lactic Acid, Venous    Component Value Date/Time   LATICACIDVEN 1.9 12/07/2022 2051    MICROBIOLOGY: No results found for this or any previous visit (from the past 240 hour(s)).  RADIOLOGY STUDIES/RESULTS: DG Chest Port 1 View  Result Date: 06/29/2023 CLINICAL DATA:  Shortness of breath. EXAM: PORTABLE CHEST 1 VIEW COMPARISON:  06/27/2023 FINDINGS: Low volume film. Interstitial markings are diffusely coarsened with chronic features. Streaky opacity at the bases compatible with atelectasis or scarring. The cardiopericardial silhouette is within normal limits for size. IMPRESSION: Low volume film with bibasilar atelectasis or scarring. Electronically Signed   By: Kennith Center M.D.   On: 06/29/2023 10:21     LOS: 2 days   Jeoffrey Massed, MD  [facility]    To contact the attending provider between 7A-7P or the covering provider during after hours 7P-7A, please log into the web site [email or web address] and access using universal [place] password for that web site. If you do not have the password, please call the hospital  operator.  06/30/2023, 10:54 AM

## 2023-07-01 ENCOUNTER — Inpatient Hospital Stay (HOSPITAL_COMMUNITY): Payer: Medicare HMO

## 2023-07-01 DIAGNOSIS — K219 Gastro-esophageal reflux disease without esophagitis: Secondary | ICD-10-CM | POA: Diagnosis not present

## 2023-07-01 DIAGNOSIS — G40901 Epilepsy, unspecified, not intractable, with status epilepticus: Secondary | ICD-10-CM | POA: Diagnosis not present

## 2023-07-01 DIAGNOSIS — M069 Rheumatoid arthritis, unspecified: Secondary | ICD-10-CM | POA: Diagnosis not present

## 2023-07-01 DIAGNOSIS — G9341 Metabolic encephalopathy: Secondary | ICD-10-CM | POA: Diagnosis not present

## 2023-07-01 LAB — BASIC METABOLIC PANEL
Anion gap: 14 (ref 5–15)
BUN: 6 mg/dL (ref 6–20)
CO2: 23 mmol/L (ref 22–32)
Calcium: 8.5 mg/dL — ABNORMAL LOW (ref 8.9–10.3)
Chloride: 102 mmol/L (ref 98–111)
Creatinine, Ser: 0.71 mg/dL (ref 0.44–1.00)
GFR, Estimated: >60 mL/min (ref >60)
Glucose, Bld: 97 mg/dL (ref 70–99)
Potassium: 3 mmol/L — ABNORMAL LOW (ref 3.5–5.1)
Sodium: 139 mmol/L (ref 135–145)

## 2023-07-01 LAB — CBC
HCT: 45 % (ref 36.0–46.0)
Hemoglobin: 14.5 g/dL (ref 12.0–15.0)
MCH: 29.4 pg (ref 26.0–34.0)
MCHC: 32.2 g/dL (ref 30.0–36.0)
MCV: 91.1 fL (ref 80.0–100.0)
Platelets: 338 10*3/uL (ref 150–400)
RBC: 4.94 MIL/uL (ref 3.87–5.11)
RDW: 14.6 % (ref 11.5–15.5)
WBC: 14.4 10*3/uL — ABNORMAL HIGH (ref 4.0–10.5)
nRBC: 0 % (ref 0.0–0.2)

## 2023-07-01 LAB — PHENYTOIN LEVEL, TOTAL: Phenytoin Lvl: 11.1 ug/mL (ref 10.0–20.0)

## 2023-07-01 LAB — GLUCOSE, CAPILLARY
Glucose-Capillary: 101 mg/dL — ABNORMAL HIGH (ref 70–99)
Glucose-Capillary: 110 mg/dL — ABNORMAL HIGH (ref 70–99)
Glucose-Capillary: 127 mg/dL — ABNORMAL HIGH (ref 70–99)
Glucose-Capillary: 148 mg/dL — ABNORMAL HIGH (ref 70–99)
Glucose-Capillary: 186 mg/dL — ABNORMAL HIGH (ref 70–99)
Glucose-Capillary: 84 mg/dL (ref 70–99)
Glucose-Capillary: 89 mg/dL (ref 70–99)

## 2023-07-01 LAB — PROCALCITONIN: Procalcitonin: <0.1 ng/mL

## 2023-07-01 LAB — PHOSPHORUS
Phosphorus: 2.3 mg/dL — ABNORMAL LOW (ref 2.5–4.6)
Phosphorus: 2 mg/dL — ABNORMAL LOW (ref 2.5–4.6)

## 2023-07-01 LAB — ALBUMIN: Albumin: 2.3 g/dL — ABNORMAL LOW (ref 3.5–5.0)

## 2023-07-01 LAB — MAGNESIUM
Magnesium: 2.1 mg/dL (ref 1.7–2.4)
Magnesium: 2 mg/dL (ref 1.7–2.4)

## 2023-07-01 LAB — LEVETIRACETAM LEVEL: Levetiracetam Lvl: <2 ug/mL — ABNORMAL LOW (ref 10.0–40.0)

## 2023-07-01 MED ORDER — PROSOURCE TF20 ENFIT COMPATIBL EN LIQD
60.0000 mL | Freq: Two times a day (BID) | ENTERAL | Status: DC
  Administered 2023-07-01 – 2023-07-03 (×5): 60 mL
  Filled 2023-07-01 (×5): qty 60

## 2023-07-01 MED ORDER — SODIUM CHLORIDE 0.9 % IV SOLN
200.0000 mg | Freq: Two times a day (BID) | INTRAVENOUS | Status: DC
  Administered 2023-07-01 – 2023-07-03 (×4): 200 mg via INTRAVENOUS
  Filled 2023-07-01 (×5): qty 20

## 2023-07-01 MED ORDER — HYALURONIDASE HUMAN 150 UNIT/ML IJ SOLN
150.0000 [IU] | Freq: Once | INTRAMUSCULAR | Status: AC
  Administered 2023-07-01: 150 [IU] via SUBCUTANEOUS
  Filled 2023-07-01: qty 1

## 2023-07-01 MED ORDER — ORAL CARE MOUTH RINSE
15.0000 mL | OROMUCOSAL | Status: DC
  Administered 2023-07-01 – 2023-07-04 (×14): 15 mL via OROMUCOSAL

## 2023-07-01 MED ORDER — ACETAMINOPHEN 650 MG RE SUPP
650.0000 mg | Freq: Once | RECTAL | Status: AC
  Administered 2023-07-01: 650 mg via RECTAL
  Filled 2023-07-01: qty 1

## 2023-07-01 MED ORDER — ADULT MULTIVITAMIN W/MINERALS CH
1.0000 | ORAL_TABLET | Freq: Every day | ORAL | Status: DC
  Administered 2023-07-01 – 2023-07-03 (×3): 1
  Filled 2023-07-01 (×3): qty 1

## 2023-07-01 MED ORDER — PANTOPRAZOLE SODIUM 40 MG IV SOLR
40.0000 mg | INTRAVENOUS | Status: DC
  Administered 2023-07-01 – 2023-07-03 (×3): 40 mg via INTRAVENOUS
  Filled 2023-07-01 (×3): qty 10

## 2023-07-01 MED ORDER — POTASSIUM PHOSPHATES 15 MMOLE/5ML IV SOLN
30.0000 mmol | Freq: Once | INTRAVENOUS | Status: AC
  Administered 2023-07-01: 30 mmol via INTRAVENOUS
  Filled 2023-07-01: qty 10

## 2023-07-01 MED ORDER — SODIUM CHLORIDE 0.9 % IV SOLN
3.0000 g | Freq: Four times a day (QID) | INTRAVENOUS | Status: DC
  Administered 2023-07-01 – 2023-07-04 (×13): 3 g via INTRAVENOUS
  Filled 2023-07-01 (×13): qty 8

## 2023-07-01 MED ORDER — ORAL CARE MOUTH RINSE: 15.0000 mL | OROMUCOSAL | Status: DC | PRN

## 2023-07-01 MED ORDER — THIAMINE MONONITRATE 100 MG PO TABS
100.0000 mg | ORAL_TABLET | Freq: Every day | ORAL | Status: DC
  Administered 2023-07-01 – 2023-07-03 (×3): 100 mg
  Filled 2023-07-01 (×3): qty 1

## 2023-07-01 MED ORDER — OSMOLITE 1.5 CAL PO LIQD
1000.0000 mL | ORAL | Status: DC
  Administered 2023-07-01 – 2023-07-02 (×2): 1000 mL
  Filled 2023-07-01 (×4): qty 1000

## 2023-07-01 MED ORDER — POTASSIUM CHLORIDE 10 MEQ/100ML IV SOLN
10.0000 meq | INTRAVENOUS | Status: AC
  Administered 2023-07-01 (×3): 10 meq via INTRAVENOUS
  Filled 2023-07-01 (×3): qty 100

## 2023-07-01 NOTE — Progress Notes (Signed)
eLink Physician-Brief Progress Note Patient Name: Abigail [name] [facility] DOB: 1966/04/08 MRN: [id number]   Date of Service  07/01/2023  HPI/Events of Note  Patient with low-grade fevers which are attributable to his seizures.  No clear evidence of infection.  eICU Interventions  Will give her a one-time dose of 650 mg Tylenol as a suppository.     Intervention Category Minor Interventions: Other:  Carilyn Goodpasture 07/01/2023, 4:30 AM

## 2023-07-01 NOTE — Progress Notes (Signed)
Pharmacy Antibiotic Note  Abigail [name] [facility] is a 57 y.o. female admitted on 06/28/2023 with altered mental status/concerns for seizures and starting antibiotics for concerns of aspiration pneumonia.  Pharmacy has been consulted for Unasyn dosing.  WBC 10.6 > 14, sCr 0.67, Tmax 100.6 No cultures No antibiotics given this admission Allergy reported to keflex, bu has had multiple fills of amoxicillin and augmentin in 2023  Plan: Unasyn 3g IV every 6 hours Monitor renal function Follow up signs of clinical improvement, LOT, de-escalation of antibiotics   Weight: 70 kg (154 lb 5.2 oz)  Temp (24hrs), Avg:98.5 F (36.9 C), Min:97 F (36.1 C), Max:100.6 F (38.1 C)  Recent Labs  Lab 06/27/23 2246 06/28/23 0444 06/29/23 0732 06/30/23 0329  WBC 12.8* 10.6*  --  14.0*  CREATININE 0.64 0.64 0.75 0.67    Estimated Creatinine Clearance: 74.5 mL/min (by C-G formula based on SCr of 0.67 mg/dL).    Allergies  Allergen Reactions   Nsaids Hives   Tapentadol Swelling, Rash and Other (See Comments)    Nucynta- Made her deathly sick   Cephalexin Other (See Comments)    Reaction not cited   Codeine Other (See Comments)    Reaction not cited   Darvocet [Propoxyphene N-Acetaminophen] Other (See Comments)    Reaction not cited   Latex Other (See Comments)    Reaction not cited   Silicone Other (See Comments)    Reaction not cited   Sulfa Antibiotics Hives   Tape Other (See Comments)    Reaction not cited   Meloxicam Rash    Antimicrobials this admission: Unasyn 11/11 >>   Microbiology results: 11/10 MRSA PCR:   Thank you for allowing pharmacy to be a part of this patient's care.  Arabella Merles, PharmD. Clinical Pharmacist 07/01/2023 6:44 AM

## 2023-07-01 NOTE — Procedures (Signed)
Patient Name: Abigail [name]  MRN: [id number]  Epilepsy Attending: [name]  Referring Physician/Provider: Erick Blinks, MD  Duration: 07/01/2023 0430 to 07/02/2023 0430   Patient history:  57 y.o. female with hx of hx of epilepsy, RA, giant cell arteritis, prescription abuse p/w AMS throughout the day. EEG to evaluate for seizure   Level of alertness: Awake, asleep   AEDs during EEG study: LEV, LCM, PHT   Technical aspects: This EEG study was done with scalp electrodes positioned according to the 10-20 International system of electrode placement. Electrical activity was reviewed with band pass filter of 1-70Hz , sensitivity of 7 uV/mm, display speed of 50mm/sec with a 60Hz  notched filter applied as appropriate. EEG data were recorded continuously and digitally stored.  Video monitoring was available and reviewed as appropriate.   Description: No posterior dominant rhythm was seen. Sleep was characterized by vertex waves, sleep spindles (12 to 14 Hz), maximal frontocentral region. EEG showed continuous generalized polymorphic sharply contoured 3 to 6 Hz theta-delta slowing. Generalized periodic discharges with triphasic morphology at 1-2 hz were also noted, more frequent when awake/stimulated. Lateralized periodic discharges ( LPDs) were noted in right parieto-occipital region, qasi periodic at 0.25-1Hz . Hyperventilation and photic stimulation were not performed.      ABNORMALITY - Lateralized periodic discharges ( LPD), right parieto-occipital region - Periodic discharges with triphasic morphology, generalized - Continuous slow, generalized   IMPRESSION: This study showed evidence of epileptogenicity arising from  right parieto-occipital region with increased risk of seizure recurrence. Additionally EEG  showed generalized periodic discharges with triphasic morphology. This eeg pattern is typically due to toxic-metabolic causes. However, due to the frequency of  discharges it can be on the ictal-interictal continuum. Lastly there was moderate to severe diffuse encephalopathy.  No definite seizures were noted.     Abigail [name]

## 2023-07-01 NOTE — Progress Notes (Signed)
Initial Nutrition Assessment  DOCUMENTATION CODES:   Severe malnutrition in context of chronic illness  INTERVENTION:   Initiate tube feeding via Cortrak tube: Osmolite 1.5 at 25 ml/h and increase by 10 ml every 8 hours to goal rate of 55 ml/h (1320 ml per day) Prosource TF20 60 ml BID  Provides 2140 kcal, 122 gm protein, 1003 ml free water daily  MVI with minerals daily  100 mg thiamine daily x 7 days  Monitor magnesium and phosphorus every 12 hours x 4 occurrences, MD to replete as needed, as pt is at risk for refeeding syndrome given pt meets criteria for severe malnutrition.   NUTRITION DIAGNOSIS:   Severe Malnutrition related to chronic illness (uncontrolled seizures) as evidenced by severe muscle depletion, moderate fat depletion.  GOAL:   Patient will meet greater than or equal to 90% of their needs  MONITOR:   TF tolerance  REASON FOR ASSESSMENT:   Consult Enteral/tube feeding initiation and management  ASSESSMENT:   Pt with PMH of giant cell arteritis not on steroids, seizure disorder noncompliant with AEDs, basal cell carcinoma, smoker, HTN, HLD, fibromyalgia, and anxiety admitted 11/8 for seizures.   No family at bedside. Pt just had cortrak placed. Per EMS pt has been having daily seizures. Per hx pt is noncompliant with her seizures medications. Per chart review pt went from 202 lb to 154 lb which would be a 23.7% weight loss in 7 months. Per review of previous RD notes pt had reported a UBW of 205 lb and has demonstrated continued weight loss from April 2024 admission to her May 2024 admission.   11/10 - tx to ICU  11/11 - breakthrough seizures and vomiting  Medications reviewed and include:  NS  @ 50 ml/hr  Unasyn Vimpat Keppra   Labs reviewed:  K 3.0 CBG's: 89-116  NUTRITION - FOCUSED PHYSICAL EXAM:  Flowsheet Row Most Recent Value  Orbital Region Mild depletion  Upper Arm Region Moderate depletion  Thoracic and Lumbar Region Moderate  depletion  Buccal Region Moderate depletion  Temple Region Moderate depletion  Clavicle Bone Region Moderate depletion  Clavicle and Acromion Bone Region Severe depletion  Scapular Bone Region Unable to assess  Dorsal Hand Moderate depletion  Patellar Region Severe depletion  Anterior Thigh Region Severe depletion  Posterior Calf Region Severe depletion  Edema (RD Assessment) Mild  Hair Reviewed  Eyes Reviewed  Mouth Reviewed  Skin Reviewed  Nails Reviewed       Diet Order:   Diet Order             Diet NPO time specified  Diet effective now                   EDUCATION NEEDS:   Not appropriate for education at this time  Skin:  Skin Assessment: Reviewed RN Assessment  Last BM:  11/11 small; type 7  Height:   Ht Readings from Last 1 Encounters:  06/03/23 5\' 7"  (1.702 m)    Weight:   Wt Readings from Last 1 Encounters:  07/01/23 70 kg    BMI:  Body mass index is 24.17 kg/m.  Estimated Nutritional Needs:   Kcal:  2100-2300  Protein:  110-125 grams  Fluid:  >2 L/day  Cammy Copa., RD, LDN, CNSC See AMiON for contact information

## 2023-07-01 NOTE — Progress Notes (Addendum)
PROGRESS NOTE        PATIENT DETAILS Name: Abigail [name] [facility] Age: 57 y.o. Sex: female Date of Birth: 1966-08-11 Admit Date: 06/28/2023 Admitting Physician Tereasa Coop, MD [id number], [name], MD  Brief Summary: Patient is a 57 y.o.  female with history of RA/GCA-no longer on prednisone, chronic pain on opiates, seizure disorder noncompliant with AEDs-presented with altered mental status-concern for subclinical seizures-transferred from  [facility] to [facility] for LTM EEG.  Significant events: 11/8>> admit to [facility] 11/10>> transfer to ICU-for close monitoring-at risk for aspiration  Significant studies: 11/7>> CXR: Bibasilar atelectasis 11/7>> CT head: No acute intracranial abnormality 11/8-11/9>> LTM EEG: No seizures 11/9>> LTM EEG: Periodic discharges with triphasic morphology 11/10>> LTM EEG: Epileptogenicity arising from right parieto-occipital area.  Significant microbiology data: None  Procedures: None  Consults: Neurology  Subjective: Still pretty lethargic-encephalopathic-difficult exam-but does moan/groan/order a few words when vigorous sternal rub is done.  Not much gurgling this morning.  No vomiting overnight.  Objective: Vitals: Blood pressure 132/68, pulse 91, temperature 99.1 F (37.3 C), temperature source Axillary, resp. rate (!) 22, weight 70 kg, SpO2 98%.   Exam: Gen Exam:Alert awake-not in any distress HEENT:atraumatic, normocephalic Chest: B/L clear to auscultation anteriorly CVS:S1S2 regular Abdomen:soft non tender, non distended Extremities:no edema Neurology: Difficult exam but appears nonfocal. Skin: no rash   Pertinent Labs/Radiology:    Latest Ref Rng & Units 07/01/2023    7:02 AM 06/30/2023    3:29 AM 06/28/2023    4:44 AM  CBC  WBC 4.0 - 10.5 K/uL 14.4  14.0  10.6   Hemoglobin 12.0 - 15.0 g/dL 69.6  29.5  28.4   Hematocrit 36.0 - 46.0 % 45.0  42.1  35.6   Platelets 150 - 400 K/uL 338  372  336     Lab  Results  Component Value Date   NA 139 07/01/2023   K 3.0 (L) 07/01/2023   CL 102 07/01/2023   CO2 23 07/01/2023      Assessment/Plan: Status epilepticus  Known history of seizures-noncompliance to AEDs.   Mental status essentially unchanged-has been lethargic-mostly unresponsive since transferred to [facility]. On Keppra/Vimpat/phenytoin LTM EEG in progress Neurology following-will defer further to neurology.  Acute metabolic encephalopathy Likely due to above Continue  AEDs Neurology following-see above. Since persistently encephalopathic-will place Cortak tube and start NG tube feedings.  Probable aspiration pneumonia Vomited x 2 times for the past several days-had some accumulation of secretions yesterday Continues to have persistent leukocytosis-low-grade fever this morning-although CXR this morning does not show any gross pneumonia-high suspicion that she may have aspirated-starting Unasyn Thankfully protecting her airway much better today-no "gurgling" today. Maintain strict aspiration precautions.  Hypokalemia Replete/recheck  History of RA/giant cell arteritis No longer on steroids-per last outpatient rheumatology note.  Chronic pain syndrome-on chronic narcotics Fibromyalgia All narcotics/Cymbalta on hold-given ongoing confusion/encephalopathy.  Chronic pancreatitis Benign abdominal exam-although she vomited this morning Supportive care for now Follow  BMI: Estimated body mass index is 24.17 kg/m as calculated from the following:   Height as of 06/03/23: 5\' 7"  (1.702 m).   Weight as of this encounter: 70 kg.   Code status:   Code Status: Full Code   DVT Prophylaxis: enoxaparin (LOVENOX) injection 40 mg Start: 06/28/23 1600 SCDs Start: 06/28/23 0404 Place TED hose Start: 06/28/23 0404   Family Communication:  Mother-Barbara charge-[phone] on  11/11-updated over the phone  Disposition Plan: Status is: Inpatient Remains inpatient appropriate because:  Severity of illness   Planned Discharge Destination:Home   Diet: Diet Order             Diet NPO time specified  Diet effective now                     Antimicrobial agents: Anti-infectives (From admission, onward)    Start     Dose/Rate Route Frequency Ordered Stop   07/01/23 0800  Ampicillin-Sulbactam (UNASYN) 3 g in sodium chloride 0.9 % 100 mL IVPB        3 g 200 mL/hr over 30 Minutes Intravenous Every 6 hours 07/01/23 0647          MEDICATIONS: Scheduled Meds:  Chlorhexidine Gluconate Cloth  6 each Topical Daily   enoxaparin (LOVENOX) injection  40 mg Subcutaneous Q24H   mouth rinse  15 mL Mouth Rinse 4 times per day   phenytoin (DILANTIN) IV  100 mg Intravenous TID   sodium chloride flush  3 mL Intravenous Q12H   Continuous Infusions:  sodium chloride 50 mL/hr at 07/01/23 0800   ampicillin-sulbactam (UNASYN) IV 3 g (07/01/23 0709)   lacosamide (VIMPAT) IV Stopped (06/30/23 2158)   levETIRAcetam 1,500 mg (07/01/23 0917)   PRN Meds:.albuterol, dextrose, LORazepam, ondansetron **OR** ondansetron (ZOFRAN) IV, mouth rinse, sodium chloride flush   I have personally reviewed following labs and imaging studies  LABORATORY DATA: CBC: Recent Labs  Lab 06/27/23 2246 06/28/23 0444 06/30/23 0329 07/01/23 0702  WBC 12.8* 10.6* 14.0* 14.4*  NEUTROABS 9.2*  --  11.0*  --   HGB 12.4 11.5* 13.4 14.5  HCT 38.0 35.6* 42.1 45.0  MCV 91.6 91.5 91.5 91.1  PLT 383 336 372 338    Basic Metabolic Panel: Recent Labs  Lab 06/27/23 2246 06/28/23 0444 06/29/23 0732 06/30/23 0329 07/01/23 0702  NA 136 139 137 136 139  K 3.0* 3.2* 3.6 3.0* 3.0*  CL 88* 98 97* 98 102  CO2 36* 33* 27 22 23   GLUCOSE 108* 97 77 90 97  BUN 6 <5* 6 8 6   CREATININE 0.64 0.64 0.75 0.67 0.71  CALCIUM 8.1* 7.9* 8.3* 8.2* 8.5*  MG 2.0  --  1.9  --  2.1    GFR: Estimated Creatinine Clearance: 74.5 mL/min (by C-G formula based on SCr of 0.71 mg/dL).  Liver Function Tests: Recent Labs   Lab 06/27/23 2246 06/28/23 0444  AST 22 20  ALT 11 11  ALKPHOS 134* 111  BILITOT 0.6 0.7  PROT 7.2 6.0*  ALBUMIN 2.4* 1.9*   Recent Labs  Lab 06/27/23 2245  LIPASE 57*   Recent Labs  Lab 06/28/23 0444  AMMONIA <10    Coagulation Profile: No results for input(s): "INR", "PROTIME" in the last 168 hours.  Cardiac Enzymes: No results for input(s): "CKTOTAL", "CKMB", "CKMBINDEX", "TROPONINI" in the last 168 hours.  BNP (last 3 results) No results for input(s): "PROBNP" in the last 8760 hours.  Lipid Profile: No results for input(s): "CHOL", "HDL", "LDLCALC", "TRIG", "CHOLHDL", "LDLDIRECT" in the last 72 hours.  Thyroid Function Tests: No results for input(s): "TSH", "T4TOTAL", "FREET4", "T3FREE", "THYROIDAB" in the last 72 hours.  Anemia Panel: No results for input(s): "VITAMINB12", "FOLATE", "FERRITIN", "TIBC", "IRON", "RETICCTPCT" in the last 72 hours.  Urine analysis:    Component Value Date/Time   COLORURINE YELLOW 12/24/2022 1551   APPEARANCEUR CLEAR 12/24/2022 1551   APPEARANCEUR Clear 05/24/2014 1903  LABSPEC 1.012 12/24/2022 1551   LABSPEC 1.015 05/24/2014 1903   PHURINE 8.0 12/24/2022 1551   GLUCOSEU NEGATIVE 12/24/2022 1551   GLUCOSEU Negative 05/24/2014 1903   HGBUR NEGATIVE 12/24/2022 1551   BILIRUBINUR NEGATIVE 12/24/2022 1551   BILIRUBINUR Negative 05/24/2014 1903   KETONESUR NEGATIVE 12/24/2022 1551   PROTEINUR NEGATIVE 12/24/2022 1551   NITRITE NEGATIVE 12/24/2022 1551   LEUKOCYTESUR NEGATIVE 12/24/2022 1551   LEUKOCYTESUR Negative 05/24/2014 1903    Sepsis Labs: Lactic Acid, Venous    Component Value Date/Time   LATICACIDVEN 1.9 12/07/2022 2051    MICROBIOLOGY: Recent Results (from the past 240 hour(s))  MRSA Next Gen by PCR, Nasal     Status: None   Collection Time: 06/30/23  4:47 PM   Specimen: Nasal Mucosa; Nasal Swab  Result Value Ref Range Status   MRSA by PCR Next Gen NOT DETECTED NOT DETECTED Final    Comment: (NOTE) The  GeneXpert MRSA Assay (FDA approved for NASAL specimens only), is one component of a comprehensive MRSA colonization surveillance program. It is not intended to diagnose MRSA infection nor to guide or monitor treatment for MRSA infections. Test performance is not FDA approved in patients less than 31 years old. Performed at [facility], [address] [place], [place] [address]     RADIOLOGY STUDIES/RESULTS: DG Chest Port 1 View  Result Date: 06/29/2023 CLINICAL DATA:  Shortness of breath. EXAM: PORTABLE CHEST 1 VIEW COMPARISON:  06/27/2023 FINDINGS: Low volume film. Interstitial markings are diffusely coarsened with chronic features. Streaky opacity at the bases compatible with atelectasis or scarring. The cardiopericardial silhouette is within normal limits for size. IMPRESSION: Low volume film with bibasilar atelectasis or scarring. Electronically Signed   By: Kennith Center M.D.   On: 06/29/2023 10:21     LOS: 3 days   Jeoffrey Massed, MD  [facility]    To contact the attending provider between 7A-7P or the covering provider during after hours 7P-7A, please log into the web site [email or web address] and access using universal [place] password for that web site. If you do not have the password, please call the hospital operator.  07/01/2023, 9:26 AM

## 2023-07-01 NOTE — TOC CM/SW Note (Signed)
Transition of Care [facility]) - Inpatient Brief Assessment   Patient Details  Name: Abigail [name] [facility] MRN: [id number] Date of Birth: 01/04/1966  Transition of Care [facility]) CM/SW Contact:    Mearl Latin, LCSW Phone Number: 07/01/2023, 4:44 PM   Clinical Narrative: Patient admitted from home and is currently lethargic with cortrak. No current TOC needs identified at this time but please place consult as needs arise.    Transition of Care Asessment: Insurance and Status: Insurance coverage has been reviewed Patient has primary care physician: Yes Home environment has been reviewed: From home Prior level of function:: Independent Prior/Current Home Services: No current home services Social Determinants of Health Reivew: SDOH reviewed no interventions necessary Readmission risk has been reviewed: Yes Transition of care needs: no transition of care needs at this time

## 2023-07-01 NOTE — Procedures (Signed)
Cortrak  Person Inserting Tube:  Mahala Menghini, RD Tube Type:  Cortrak - 43 inches Tube Size:  10 Tube Location:  Left nare Secured by: Bridle Technique Used to Measure Tube Placement:  Marking at nare/corner of mouth Cortrak Secured At:  76 cm   Cortrak Tube Team Note:  Consult received to place a Cortrak feeding tube.   X-ray is required, abdominal x-ray has been ordered by the Cortrak team. Please confirm tube placement before using the Cortrak tube.   If the tube becomes dislodged please keep the tube and contact the Cortrak team at [email or web address] for replacement.  If after hours and replacement cannot be delayed, place a NG tube and confirm placement with an abdominal x-ray.    Mertie Clause, MS, RD, LDN Registered Dietitian II Please see AMiON for contact information.

## 2023-07-01 NOTE — Progress Notes (Signed)
Subjective: Continues to be significantly lethargic.  Did not follow any commands.  Did not even open her eyes.  ROS: Unable to obtain due to poor mental status  Examination  Vital signs in last 24 hours: Temp:  [97 F (36.1 C)-100.6 F (38.1 C)] 99.1 F (37.3 C) (11/11 0800) Pulse Rate:  [76-98] 95 (11/11 1000) Resp:  [15-32] 15 (11/11 1000) BP: (96-142)/(53-104) 130/72 (11/11 1000) SpO2:  [90 %-98 %] 98 % (11/11 1000) Weight:  [70 kg] 70 kg (11/11 0423)  General: lying in bed, NAD Neuro: Keeps eyes closed, moans to noxious stimulation but does not follow any commands, on passive eye opening, appears to have right gaze preference but not forced gaze deviation, spontaneously moving right upper and right lower extremity more than left upper and left lower extremity but does withdraw to noxious stimuli with antigravity strength in all 4 extremities.  Basic Metabolic Panel: Recent Labs  Lab 06/27/23 2246 06/28/23 0444 06/29/23 0732 06/30/23 0329 07/01/23 0702  NA 136 139 137 136 139  K 3.0* 3.2* 3.6 3.0* 3.0*  CL 88* 98 97* 98 102  CO2 36* 33* 27 22 23   GLUCOSE 108* 97 77 90 97  BUN 6 <5* 6 8 6   CREATININE 0.64 0.64 0.75 0.67 0.71  CALCIUM 8.1* 7.9* 8.3* 8.2* 8.5*  MG 2.0  --  1.9  --  2.1    CBC: Recent Labs  Lab 06/27/23 2246 06/28/23 0444 06/30/23 0329 07/01/23 0702  WBC 12.8* 10.6* 14.0* 14.4*  NEUTROABS 9.2*  --  11.0*  --   HGB 12.4 11.5* 13.4 14.5  HCT 38.0 35.6* 42.1 45.0  MCV 91.6 91.5 91.5 91.1  PLT 383 336 372 338    Coagulation Studies: No results for input(s): "LABPROT", "INR" in the last 72 hours.  Imaging CT head without contrast 06/27/2023: No acute intracranial abnormality.    ASSESSMENT AND PLAN: 57 year old female with history of epilepsy who came in with breakthrough seizure in the setting of medication noncompliance.  Epilepsy with breakthrough seizure -Due to medication noncompliance  Recommendations -Continue Keppra 1500 mg twice  daily and Vimpat 200 mg twice daily Will continue phenytoin 100 mg every 8 hours.  Will check phenytoin level as well as albumin -Continue seizure precautions -as needed IV benzo for clinical seizure  I have spent a total of  36  minutes with the patient reviewing hospital notes,  test results, labs and examining the patient as well as establishing an assessment and plan.  > 50% of time was spent in direct patient care.    Lindie Spruce Epilepsy [facility] For questions after 5pm please refer to AMION to reach the Neurologist on call

## 2023-07-01 NOTE — Progress Notes (Signed)
SLP Cancellation Note  Patient Details Name: Abigail [name] [facility] MRN: [id number] DOB: [birth year]   Cancelled treatment:       Reason Eval/Treat Not Completed: Fatigue/lethargy limiting ability to participate- will follow for readiness for swallow evaluation. Cortrak placed this am with minimal reaction from pt per RD.  Antino Mayabb L. Samson Frederic, MA CCC/SLP Clinical Specialist - Acute Care SLP Acute Rehabilitation Services Office number [phone]    Blenda Mounts Laurice 07/01/2023, 10:55 AM

## 2023-07-02 ENCOUNTER — Other Ambulatory Visit: Payer: Self-pay

## 2023-07-02 DIAGNOSIS — G40901 Epilepsy, unspecified, not intractable, with status epilepticus: Secondary | ICD-10-CM | POA: Diagnosis not present

## 2023-07-02 DIAGNOSIS — K219 Gastro-esophageal reflux disease without esophagitis: Secondary | ICD-10-CM | POA: Diagnosis not present

## 2023-07-02 DIAGNOSIS — G9341 Metabolic encephalopathy: Secondary | ICD-10-CM | POA: Diagnosis not present

## 2023-07-02 DIAGNOSIS — M069 Rheumatoid arthritis, unspecified: Secondary | ICD-10-CM | POA: Diagnosis not present

## 2023-07-02 LAB — COMPREHENSIVE METABOLIC PANEL
ALT: 16 U/L (ref 0–44)
AST: 25 U/L (ref 15–41)
Albumin: 2.3 g/dL — ABNORMAL LOW (ref 3.5–5.0)
Alkaline Phosphatase: 96 U/L (ref 38–126)
Anion gap: 10 (ref 5–15)
BUN: 7 mg/dL (ref 6–20)
CO2: 27 mmol/L (ref 22–32)
Calcium: 8.2 mg/dL — ABNORMAL LOW (ref 8.9–10.3)
Chloride: 99 mmol/L (ref 98–111)
Creatinine, Ser: 0.48 mg/dL (ref 0.44–1.00)
GFR, Estimated: >60 mL/min (ref >60)
Glucose, Bld: 162 mg/dL — ABNORMAL HIGH (ref 70–99)
Potassium: 2.9 mmol/L — ABNORMAL LOW (ref 3.5–5.1)
Sodium: 136 mmol/L (ref 135–145)
Total Bilirubin: 0.5 mg/dL (ref <1.2)
Total Protein: 6.6 g/dL (ref 6.5–8.1)

## 2023-07-02 LAB — CBC
HCT: 41.5 % (ref 36.0–46.0)
Hemoglobin: 13.5 g/dL (ref 12.0–15.0)
MCH: 28.5 pg (ref 26.0–34.0)
MCHC: 32.5 g/dL (ref 30.0–36.0)
MCV: 87.7 fL (ref 80.0–100.0)
Platelets: 333 10*3/uL (ref 150–400)
RBC: 4.73 MIL/uL (ref 3.87–5.11)
RDW: 14.4 % (ref 11.5–15.5)
WBC: 12.2 10*3/uL — ABNORMAL HIGH (ref 4.0–10.5)
nRBC: 0 % (ref 0.0–0.2)

## 2023-07-02 LAB — GLUCOSE, CAPILLARY
Glucose-Capillary: 198 mg/dL — ABNORMAL HIGH (ref 70–99)
Glucose-Capillary: 165 mg/dL — ABNORMAL HIGH (ref 70–99)
Glucose-Capillary: 142 mg/dL — ABNORMAL HIGH (ref 70–99)
Glucose-Capillary: 151 mg/dL — ABNORMAL HIGH (ref 70–99)
Glucose-Capillary: 161 mg/dL — ABNORMAL HIGH (ref 70–99)

## 2023-07-02 LAB — PHOSPHORUS: Phosphorus: 1.5 mg/dL — ABNORMAL LOW (ref 2.5–4.6)

## 2023-07-02 LAB — MAGNESIUM: Magnesium: 2 mg/dL (ref 1.7–2.4)

## 2023-07-02 MED ORDER — SODIUM CHLORIDE 0.9% FLUSH
10.0000 mL | Freq: Two times a day (BID) | INTRAVENOUS | Status: DC
  Administered 2023-07-02 – 2023-07-04 (×4): 10 mL

## 2023-07-02 MED ORDER — BANATROL TF EN LIQD
60.0000 mL | Freq: Three times a day (TID) | ENTERAL | Status: DC
  Administered 2023-07-02 – 2023-07-03 (×4): 60 mL
  Filled 2023-07-02 (×4): qty 60

## 2023-07-02 MED ORDER — POTASSIUM PHOSPHATES 15 MMOLE/5ML IV SOLN
30.0000 mmol | Freq: Once | INTRAVENOUS | Status: AC
  Administered 2023-07-02: 30 mmol via INTRAVENOUS
  Filled 2023-07-02: qty 10

## 2023-07-02 MED ORDER — OXYCODONE HCL 5 MG PO TABS
15.0000 mg | ORAL_TABLET | Freq: Every day | ORAL | Status: DC | PRN
  Administered 2023-07-02 – 2023-07-04 (×6): 15 mg via ORAL
  Filled 2023-07-02 (×6): qty 3

## 2023-07-02 MED ORDER — POTASSIUM CHLORIDE 20 MEQ PO PACK
40.0000 meq | PACK | ORAL | Status: AC
  Administered 2023-07-02 (×2): 40 meq
  Filled 2023-07-02 (×2): qty 2

## 2023-07-02 MED ORDER — SODIUM CHLORIDE 0.9% FLUSH: 10.0000 mL | INTRAVENOUS | Status: DC | PRN

## 2023-07-02 MED ORDER — OXYCODONE HCL 5 MG PO TABS: 10.0000 mg | ORAL_TABLET | Freq: Every day | ORAL | Status: DC | PRN

## 2023-07-02 NOTE — Progress Notes (Signed)
Assessed R & L UE with and w/o [place] for PIV placement. Attempted x 3 without success. No other appropriate veins found at this time. Primary RN aware and messaged provider.

## 2023-07-02 NOTE — Plan of Care (Signed)
  Problem: Clinical Measurements: Goal: Ability to maintain clinical measurements within normal limits will improve Outcome: Progressing Goal: Will remain free from infection Outcome: Progressing Goal: Diagnostic test results will improve Outcome: Progressing Goal: Respiratory complications will improve Outcome: Progressing Goal: Cardiovascular complication will be avoided Outcome: Progressing   Problem: Activity: Goal: Risk for activity intolerance will decrease Outcome: Progressing   Problem: Nutrition: Goal: Adequate nutrition will be maintained Outcome: Progressing   Problem: Coping: Goal: Level of anxiety will decrease Outcome: Progressing   Problem: Elimination: Goal: Will not experience complications related to bowel motility Outcome: Progressing   Problem: Pain Management: Goal: General experience of comfort will improve Outcome: Progressing   Problem: Safety: Goal: Ability to remain free from injury will improve Outcome: Progressing   Problem: Skin Integrity: Goal: Risk for impaired skin integrity will decrease Outcome: Progressing   Problem: Safety: Goal: Non-violent Restraint(s) Outcome: Completed/Met

## 2023-07-02 NOTE — Procedures (Addendum)
Patient Name: Abigail [name] [facility]  MRN: [id number]  Epilepsy Attending: [name]  Referring Physician/Provider: Erick Blinks, MD  Duration: 07/02/2023 0430 to 07/02/2023 1117   Patient history:  57 y.o. female with hx of hx of epilepsy, RA, giant cell arteritis, prescription abuse p/w AMS throughout the day. EEG to evaluate for seizure   Level of alertness: Awake, asleep   AEDs during EEG study: LEV, LCM, PHT   Technical aspects: This EEG study was done with scalp electrodes positioned according to the 10-20 International system of electrode placement. Electrical activity was reviewed with band pass filter of 1-70Hz , sensitivity of 7 uV/mm, display speed of 87mm/sec with a 60Hz  notched filter applied as appropriate. EEG data were recorded continuously and digitally stored.  Video monitoring was available and reviewed as appropriate.   Description: No posterior dominant rhythm was seen. Sleep was characterized by vertex waves, sleep spindles (12 to 14 Hz), maximal frontocentral region. EEG showed continuous generalized polymorphic sharply contoured 3 to 6 Hz theta-delta slowing. Abundant polyspikes were noted in right parieto-occipital region, qasi periodic at 0.25-1Hz . Hyperventilation and photic stimulation were not performed.      ABNORMALITY - Lateralized periodic discharges ( LPD), right parieto-occipital region - Periodic discharges with triphasic morphology, generalized - Continuous slow, generalized   IMPRESSION: This study showed evidence of epileptogenicity arising from  right parieto-occipital region with increased risk of seizure recurrence. Additionally  there was moderate diffuse encephalopathy. No definite seizures were noted.     Abigail [name]

## 2023-07-02 NOTE — Progress Notes (Signed)
Peripherally Inserted Central Catheter Placement  The IV Nurse has discussed with the patient and/or persons authorized to consent for the patient, the purpose of this procedure and the potential benefits and risks involved with this procedure.  The benefits include less needle sticks, lab draws from the catheter, and the patient may be discharged home with the catheter. Risks include, but not limited to, infection, bleeding, blood clot (thrombus formation), and puncture of an artery; nerve damage and irregular heartbeat and possibility to perform a PICC exchange if needed/ordered by physician.  Alternatives to this procedure were also discussed.  Bard Power PICC patient education guide, fact sheet on infection prevention and patient information card has been provided to patient /or left at bedside. Judeth Cornfield (daughter) gave telephone consent for PICC placement.  PICC Placement Documentation  PICC Double Lumen 07/02/23 Right Cephalic 37 cm 1 cm (Active)  Indication for Insertion or Continuance of Line Limited venous access - need for IV therapy >5 days (PICC only) 07/02/23 1730  Exposed Catheter (cm) 1 cm 07/02/23 1730  Site Assessment Clean, Dry, Intact 07/02/23 1730  Lumen #1 Status Flushed;Saline locked;Blood return noted 07/02/23 1730  Lumen #2 Status Flushed;Saline locked;Blood return noted 07/02/23 1730  Dressing Type Transparent;Securing device 07/02/23 1730  Dressing Status Antimicrobial disc in place 07/02/23 1730  Line Care Connections checked and tightened 07/02/23 1730  Line Adjustment (NICU/IV Team Only) No 07/02/23 1730  Dressing Intervention New dressing;Adhesive placed at insertion site (IV team only) 07/02/23 1730  Dressing Change Due 07/09/23 07/02/23 1730       Elenore Paddy 07/02/2023, 5:31 PM

## 2023-07-02 NOTE — Progress Notes (Signed)
This RN called pharmacy due to infiltration of keppra in left forearm PIV. Per pharmacy, no specific antidote for keppra.   Per protocol, stopped medication immediately. Attempted to aspirate medication before discontinuing PIV. 1 cc fluid aspirated. Per protocol, will elevate extremity for 48 hrs. Site borders marked (8 cm x 6 cm). Site swollen, red and warm. No pain reported by pt. Will continue to monitor.

## 2023-07-02 NOTE — Progress Notes (Signed)
vLTM maintenance  All impedances below 10kohms.  No skin breakdown noted at  FP1  FP2  A2  A1

## 2023-07-02 NOTE — Progress Notes (Signed)
PROGRESS NOTE        PATIENT DETAILS Name: Abigail [name] Age: 57 y.o. Sex: female Date of Birth: Oct 26, 1965 Admit Date: 06/28/2023 Admitting Physician Tereasa Coop, MD QMV:HQIONG, [name], MD  Brief Summary: Patient is a 57 y.o.  female with history of RA/GCA-no longer on prednisone, chronic pain on opiates, seizure disorder noncompliant with AEDs-presented with altered mental status-thought to have status epilepticus-transferred from [facility] to [facility] for LTM EEG.  Neurology followed closely-AEDs adjusted-thankfully on 11/12-mental status started to improve.  See below for further details.  Significant events: 11/8>> admit to [name] 11/10>> transfer to ICU-for close monitoring-at risk for aspiration 11/11>>cortak tube placed-tube feeds started 11/12>> marked improvement in mental status-more awake-alert-following some commands.  Transfer to progressive care.  Significant studies: 11/7>> CXR: Bibasilar atelectasis 11/7>> CT head: No acute intracranial abnormality 11/8-11/9>> LTM EEG: No seizures 11/9>> LTM EEG: Periodic discharges with triphasic morphology 11/10>> LTM EEG: Epileptogenicity arising from right parieto-occipital area 11/11>> LTM EEG: Evidence of epileptogenicity arising from the right parieto-occipital region 11/12>> LTM EEG: Evidence of epileptogenicity from the right parieto-occipital area  Significant microbiology data: None  Procedures: None  Consults: Neurology  Subjective: Much better-awake-answering simple questions appropriately-moving all 4 extremities.  Following commands.  (RN at bedside)  Objective: Vitals: Blood pressure (!) 136/99, pulse 95, temperature 98.8 F (37.1 C), temperature source Axillary, resp. rate (!) 23, weight 69.5 kg, SpO2 100%.   Exam: Gen Exam:Alert awake-not in any distress HEENT:atraumatic, normocephalic Chest: B/L clear to auscultation anteriorly CVS:S1S2 regular Abdomen:soft non tender,  non distended Extremities:no edema Neurology: Non focal Skin: no rash  Pertinent Labs/Radiology:    Latest Ref Rng & Units 07/02/2023    5:51 AM 07/01/2023    7:02 AM 06/30/2023    3:29 AM  CBC  WBC 4.0 - 10.5 K/uL 12.2  14.4  14.0   Hemoglobin 12.0 - 15.0 g/dL 29.5  28.4  13.2   Hematocrit 36.0 - 46.0 % 41.5  45.0  42.1   Platelets 150 - 400 K/uL 333  338  372     Lab Results  Component Value Date   NA 136 07/02/2023   K 2.9 (L) 07/02/2023   CL 99 07/02/2023   CO2 27 07/02/2023      Assessment/Plan: Status epilepticus  Known history of seizures-noncompliance to AEDs.   Significant improvement mental status overnight-much more awake-relatively well alert-following commands-answering simple questions appropriately-although slowly Keppra/Vimpat/phenytoin Neurology following-await further recommendations.  Acute metabolic encephalopathy Likely due to above Continue  AEDs  Probable aspiration pneumonia Low-grade fever continues-but leukocytosis downtrending Suspect has CXR negative aspiration pneumonia (open vomited several days ago)  Now that encephalopathy is improving-she is protecting her airway well Continue Unasyn Transfer to progressive care Maintain aspiration precautions.    Hypokalemia Replete/recheck  Hypophosphatemia Replete/recheck  Oropharyngeal dysphagia in the setting of encephalopathy Thankfully encephalopathy better Continue Cortak feeds for now-await SLP follow-up When oral intake is adequate-will discontinue NG tube  History of RA/giant cell arteritis No longer on steroids-per last outpatient rheumatology note.  Chronic pain syndrome-on chronic narcotics Fibromyalgia All narcotics/Cymbalta on hold-given ongoing confusion/encephalopathy-suspect can be resumed over the next several days as her encephalopathy clears.  Chronic pancreatitis Benign abdominal exam-although she vomited this morning Supportive care for  now Follow  Debility/deconditioning PT/OT eval-probably may require SNF  BMI: Estimated body mass index is 24 kg/m  as calculated from the following:   Height as of 06/03/23: 5\' 7"  (1.702 m).   Weight as of this encounter: 69.5 kg.   Code status:   Code Status: Full Code   DVT Prophylaxis: enoxaparin (LOVENOX) injection 40 mg Start: 06/28/23 1600 SCDs Start: 06/28/23 0404 Place TED hose Start: 06/28/23 0404   Family Communication:  Mother-Barbara charge-[phone] on 11/12-updated over the phone  Disposition Plan: Status is: Inpatient Remains inpatient appropriate because: Severity of illness   Planned Discharge Destination:Home vsSNF   Diet: Diet Order             Diet NPO time specified  Diet effective now                     Antimicrobial agents: Anti-infectives (From admission, onward)    Start     Dose/Rate Route Frequency Ordered Stop   07/01/23 0800  Ampicillin-Sulbactam (UNASYN) 3 g in sodium chloride 0.9 % 100 mL IVPB        3 g 200 mL/hr over 30 Minutes Intravenous Every 6 hours 07/01/23 0647          MEDICATIONS: Scheduled Meds:  Chlorhexidine Gluconate Cloth  6 each Topical Daily   enoxaparin (LOVENOX) injection  40 mg Subcutaneous Q24H   feeding supplement (PROSource TF20)  60 mL Per Tube BID   multivitamin with minerals  1 tablet Per Tube Daily   mouth rinse  15 mL Mouth Rinse 4 times per day   pantoprazole (PROTONIX) IV  40 mg Intravenous Q24H   phenytoin (DILANTIN) IV  100 mg Intravenous TID   potassium chloride  40 mEq Per Tube Q4H   sodium chloride flush  3 mL Intravenous Q12H   thiamine  100 mg Per Tube Daily   Continuous Infusions:  ampicillin-sulbactam (UNASYN) IV 3 g (07/02/23 0834)   feeding supplement (OSMOLITE 1.5 CAL) 45 mL/hr at 07/02/23 0800   lacosamide (VIMPAT) IV Stopped (07/01/23 2335)   levETIRAcetam 1,500 mg (07/02/23 0913)   potassium PHOSPHATE IVPB (in mmol)     PRN Meds:.albuterol, dextrose, LORazepam,  ondansetron **OR** ondansetron (ZOFRAN) IV, mouth rinse, sodium chloride flush   I have personally reviewed following labs and imaging studies  LABORATORY DATA: CBC: Recent Labs  Lab 06/27/23 2246 06/28/23 0444 06/30/23 0329 07/01/23 0702 07/02/23 0551  WBC 12.8* 10.6* 14.0* 14.4* 12.2*  NEUTROABS 9.2*  --  11.0*  --   --   HGB 12.4 11.5* 13.4 14.5 13.5  HCT 38.0 35.6* 42.1 45.0 41.5  MCV 91.6 91.5 91.5 91.1 87.7  PLT 383 336 372 338 333    Basic Metabolic Panel: Recent Labs  Lab 06/27/23 2246 06/28/23 0444 06/29/23 0732 06/30/23 0329 07/01/23 0702 07/01/23 1058 07/01/23 1645 07/02/23 0551  NA 136 139 137 136 139  --   --  136  K 3.0* 3.2* 3.6 3.0* 3.0*  --   --  2.9*  CL 88* 98 97* 98 102  --   --  99  CO2 36* 33* 27 22 23   --   --  27  GLUCOSE 108* 97 77 90 97  --   --  162*  BUN 6 <5* 6 8 6   --   --  7  CREATININE 0.64 0.64 0.75 0.67 0.71  --   --  0.48  CALCIUM 8.1* 7.9* 8.3* 8.2* 8.5*  --   --  8.2*  MG 2.0  --  1.9  --  2.1  --  2.0 2.0  PHOS  --   --   --   --   --  2.3* 2.0* 1.5*    GFR: Estimated Creatinine Clearance: 74.5 mL/min (by C-G formula based on SCr of 0.48 mg/dL).  Liver Function Tests: Recent Labs  Lab 06/27/23 2246 06/28/23 0444 07/01/23 1058 07/02/23 0551  AST 22 20  --  25  ALT 11 11  --  16  ALKPHOS 134* 111  --  96  BILITOT 0.6 0.7  --  0.5  PROT 7.2 6.0*  --  6.6  ALBUMIN 2.4* 1.9* 2.3* 2.3*   Recent Labs  Lab 06/27/23 2245  LIPASE 57*   Recent Labs  Lab 06/28/23 0444  AMMONIA <10    Coagulation Profile: No results for input(s): "INR", "PROTIME" in the last 168 hours.  Cardiac Enzymes: No results for input(s): "CKTOTAL", "CKMB", "CKMBINDEX", "TROPONINI" in the last 168 hours.  BNP (last 3 results) No results for input(s): "PROBNP" in the last 8760 hours.  Lipid Profile: No results for input(s): "CHOL", "HDL", "LDLCALC", "TRIG", "CHOLHDL", "LDLDIRECT" in the last 72 hours.  Thyroid Function Tests: No  results for input(s): "TSH", "T4TOTAL", "FREET4", "T3FREE", "THYROIDAB" in the last 72 hours.  Anemia Panel: No results for input(s): "VITAMINB12", "FOLATE", "FERRITIN", "TIBC", "IRON", "RETICCTPCT" in the last 72 hours.  Urine analysis:    Component Value Date/Time   COLORURINE YELLOW 12/24/2022 1551   APPEARANCEUR CLEAR 12/24/2022 1551   APPEARANCEUR Clear 05/24/2014 1903   LABSPEC 1.012 12/24/2022 1551   LABSPEC 1.015 05/24/2014 1903   PHURINE 8.0 12/24/2022 1551   GLUCOSEU NEGATIVE 12/24/2022 1551   GLUCOSEU Negative 05/24/2014 1903   HGBUR NEGATIVE 12/24/2022 1551   BILIRUBINUR NEGATIVE 12/24/2022 1551   BILIRUBINUR Negative 05/24/2014 1903   KETONESUR NEGATIVE 12/24/2022 1551   PROTEINUR NEGATIVE 12/24/2022 1551   NITRITE NEGATIVE 12/24/2022 1551   LEUKOCYTESUR NEGATIVE 12/24/2022 1551   LEUKOCYTESUR Negative 05/24/2014 1903    Sepsis Labs: Lactic Acid, Venous    Component Value Date/Time   LATICACIDVEN 1.9 12/07/2022 2051    MICROBIOLOGY: Recent Results (from the past 240 hour(s))  MRSA Next Gen by PCR, Nasal     Status: None   Collection Time: 06/30/23  4:47 PM   Specimen: Nasal Mucosa; Nasal Swab  Result Value Ref Range Status   MRSA by PCR Next Gen NOT DETECTED NOT DETECTED Final    Comment: (NOTE) The GeneXpert MRSA Assay (FDA approved for NASAL specimens only), is one component of a comprehensive MRSA colonization surveillance program. It is not intended to diagnose MRSA infection nor to guide or monitor treatment for MRSA infections. Test performance is not FDA approved in patients less than 35 years old. Performed at [facility], [address], [place], [place] [address]     RADIOLOGY STUDIES/RESULTS: DG Abd Portable 1V  Result Date: 07/01/2023 CLINICAL DATA:  57 year old female feeding tube placement. EXAM: PORTABLE ABDOMEN - 1 VIEW COMPARISON:  Portable abdomen this morning, CT Abdomen and Pelvis 12/14/2022. FINDINGS: Portable AP view at  1007 hours. Enteric feeding tube placed through the stomach and terminating in the right upper quadrant at the level of the proximal duodenum when correlating to previous CT Abdomen and Pelvis coronal view. Increased gastric air, but visible bowel-gas pattern remains non obstructed. Negative lung bases. IMPRESSION: Enteric feeding tube placed with tip at the proximal duodenum. Electronically Signed   By: Odessa Fleming M.D.   On: 07/01/2023 10:16   DG Abd Portable 2V  Result Date: 07/01/2023 CLINICAL  DATA:  57 year old female shortness of breath and vomiting. EXAM: PORTABLE ABDOMEN - 2 VIEW COMPARISON:  CT Abdomen and Pelvis 12/14/2022 and earlier. FINDINGS: Portable AP supine view at 0535 hours, and left-side-down lateral decubitus view at 0540 hours. Nonobstructed bowel gas pattern, no pneumoperitoneum. Chronic lumbar spine degeneration and lower lumbar interbody fusion. Right total hip arthroplasty partially visible. No acute osseous abnormality identified. Calcified aortic atherosclerosis. IMPRESSION: Nonobstructed bowel gas pattern with no pneumoperitoneum. Electronically Signed   By: Odessa Fleming M.D.   On: 07/01/2023 10:15   DG Chest Port 1 View  Result Date: 07/01/2023 CLINICAL DATA:  57 year old female shortness of breath and vomiting. EXAM: PORTABLE CHEST 1 VIEW COMPARISON:  Portable chest 06/29/2023. FINDINGS: Portable AP semi upright view at 0531 hours. Ongoing low lung volumes. Stable cardiac size and mediastinal contours. Resolved platelike right lung base opacity. But similar left lung base platelike opacity now. No superimposed pneumothorax, pulmonary edema or pleural effusion. Visualized tracheal air column is within normal limits. Cervical ACDF. Negative visible bowel gas. IMPRESSION: Ongoing Low lung volumes with mild lung base atelectasis. Electronically Signed   By: Odessa Fleming M.D.   On: 07/01/2023 10:14     LOS: 4 days   Jeoffrey Massed, MD  [facility]    To contact the attending  provider between 7A-7P or the covering provider during after hours 7P-7A, please log into the web site [email or web address] and access using universal  password for that web site. If you do not have the password, please call the hospital operator.  07/02/2023, 9:52 AM

## 2023-07-02 NOTE — Evaluation (Signed)
Clinical/Bedside Swallow Evaluation Patient Details  Name: Abigail [name] [facility] MRN: [id number] Date of Birth: [birth year]  Today's Date: 07/02/2023 Time: SLP Start Time (ACUTE ONLY): 0919 SLP Stop Time (ACUTE ONLY): 0941 SLP Time Calculation (min) (ACUTE ONLY): 22 min  Past Medical History:  Past Medical History:  Diagnosis Date   Anxiety    Arthritis    Back pain    Basal cell carcinoma 10/04/2021   R axilla - needs excised 11/28/21   Basal cell carcinoma 10/04/2021   L antecubital excised 11/14/21   Diarrhea 11/12/2016   Fibromyalgia    Generalized abdominal pain 11/12/2016   H. pylori infection    Hyperlipidemia    IBS (irritable bowel syndrome)    Infectious colitis 04/29/2016   Migraines    Moderate dehydration 04/29/2016   Muscle pain    Opioid overdose (HCC) 12/07/2022   Reflux    Unexplained weight loss 11/12/2016   Past Surgical History:  Past Surgical History:  Procedure Laterality Date   ABDOMINAL HYSTERECTOMY     APPENDECTOMY  2009   C5 FUSION     C6 FUSION     C7 FUSION     COLONOSCOPY  02/2006   COLONOSCOPY WITH PROPOFOL N/A 12/25/2016   Procedure: COLONOSCOPY WITH PROPOFOL;  Surgeon: Earline Mayotte, MD;  Location: [facility] ENDOSCOPY;  Service: Endoscopy;  Laterality: N/A;   ESOPHAGOGASTRODUODENOSCOPY (EGD) WITH PROPOFOL N/A 12/25/2016   Procedure: ESOPHAGOGASTRODUODENOSCOPY (EGD) WITH PROPOFOL;  Surgeon: Earline Mayotte, MD;  Location: [facility] ENDOSCOPY;  Service: Endoscopy;  Laterality: N/A;   FOOT SURGERY     HIP ARTHROPLASTY     L4 FUSION     L5 FUSION     S1 FUSION     HPI:  Patient is a 57 y.o. female who presented on 11/08 with AMS, thought to have status epilepticus. PHM is significant for giant cell arteritis no longer on steroids, seizure disorder noncompliant with AEDs, basal cell carcinoma, HTN, HLD, fibromyalgia, and anxiety. Waxing and waning mentation.    Assessment / Plan / Recommendation  Clinical Impression  Patient seen by SLP  for bedside swallow evaluation. Patient with improved alertness as compared to previous dates. Patient orientated to X3, although she stated she was not aware of how she arrived to the hospital. Patient minimally verbal through the session and her eyes remained closed. SLP completed oral care before administering ice chips via teaspoon. Patient with reduced labial seal and awareness of bolus. Rotary chewing appeared intact. Swallow initiation appeared to be delayed and resulted in immediate coughing. SLP discontinued PO trials as patient continued to cough several minutes after initial administration. SLP recommending patient remain NPO at this time. As her alertness increases and lethargy decreases, patient will likely demonstrate swift improvement of swallow function. ST to continue to closely follow. SLP Visit Diagnosis: Dysphagia, unspecified (R13.10)    Aspiration Risk  Moderate aspiration risk    Diet Recommendation NPO         Other  Recommendations Oral Care Recommendations: Oral care BID    Recommendations for follow up therapy are one component of a multi-disciplinary discharge planning process, led by the attending physician.  Recommendations may be updated based on patient status, additional functional criteria and insurance authorization.  Follow up Recommendations Other (comment) (TBD)      Assistance Recommended at Discharge    Functional Status Assessment Patient has had a recent decline in their functional status and demonstrates the ability to make significant improvements in function in a  reasonable and predictable amount of time.  Frequency and Duration min 2x/week  2 weeks       Prognosis Prognosis for improved oropharyngeal function: Good      Swallow Study   General Date of Onset: 06/28/23 HPI: Patient is a 57 y.o. femael who presented on 11/08 wtih AMS, thought to have status epileptius. PHM is signficant for giant cell arteritis no longer on steroids, seizure  disorder noncompliant with AEDs, basal cell carcinoma, HTN, HLD, fibromyalgia, and anxiety. Waxing and waning mentation. Type of Study: Bedside Swallow Evaluation Previous Swallow Assessment: N/a Diet Prior to this Study: NPO Temperature Spikes Noted: No Respiratory Status: Nasal cannula History of Recent Intubation: No Behavior/Cognition: Cooperative;Lethargic/Drowsy Oral Cavity Assessment: Within Functional Limits Oral Care Completed by SLP: Yes Oral Cavity - Dentition: Adequate natural dentition Self-Feeding Abilities: Total assist Patient Positioning: Upright in bed Baseline Vocal Quality: Low vocal intensity;Hoarse Volitional Swallow: Able to elicit    Oral/Motor/Sensory Function Overall Oral Motor/Sensory Function: Within functional limits   Ice Chips Ice chips: Impaired Presentation: Spoon Oral Phase Impairments: Reduced labial seal Pharyngeal Phase Impairments: Cough - Immediate;Suspected delayed Swallow   Thin Liquid Thin Liquid: Not tested    Nectar Thick Nectar Thick Liquid: Not tested   Honey Thick Honey Thick Liquid: Not tested   Puree Puree: Not tested   Solid     Solid: Not tested      Marline Backbone, B.S., Speech Therapy Student   07/02/2023,10:56 AM

## 2023-07-02 NOTE — Progress Notes (Signed)
LTM EEG discontinued - no skin breakdown at unhook.   

## 2023-07-02 NOTE — Progress Notes (Signed)
Pharmacy Phenytoin Note  Abigail [name] is a 57 y.o. female admitted on 06/28/2023 with  status epilepticus .  Pharmacy has been consulted for phenytoin dosing.  IBW: 62kg  Actual body weight: 76kg (from 06/03/2023)  Patient is a 57 y.o. female with seizure disorder noncompliant with AEDs presenting with altered mental status with concern for seizures. She was transferred from [facility] to [facility]. MD Selina Cooley ordered an IV fosphenytoin load of 1140 mg (~18mg /kg IBW).   On maintenance dosing of phenytoin IV and dose using IBW, 3-5 mg/kg/day. Of note, patient has chronically low albumin. Will need to adjust any levels drawn or order free phenytoin levels.   Phenytoin level of 11.1 (11/11 10:58) yesterday. Dose was given at 9:15, pre-steady state. Alb = 2.3, corrected level = 19.8. Unclear how to interpret this given that it was pre-steady state and drawn after the dose. Continue current therapy.   Plan: Continue phenytoin 100mg  IV TID  Monitor for seizure control  Consider levels once at steady state (5-7 days) - likely Friday AM in conversation with Neurology   Weight: 69.5 kg (153 lb 3.5 oz)  Estimated Creatinine Clearance: 74.5 mL/min (by C-G formula based on SCr of 0.48 mg/dL).    Allergies  Allergen Reactions   Nsaids Hives   Tapentadol Swelling, Rash and Other (See Comments)    Nucynta- Made her deathly sick   Cephalexin Other (See Comments)    Reaction not cited   Codeine Other (See Comments)    Reaction not cited   Darvocet [Propoxyphene N-Acetaminophen] Other (See Comments)    Reaction not cited   Latex Other (See Comments)    Reaction not cited   Silicone Other (See Comments)    Reaction not cited   Sulfa Antibiotics Hives   Tape Other (See Comments)    Reaction not cited   Meloxicam Rash     Thank you for allowing pharmacy to be a part of this patient's care.  Blane Ohara, PharmD  PGY2 Pharmacy Resident

## 2023-07-02 NOTE — Progress Notes (Signed)
Subjective: No further seizures overnight.  More awake today and following commands.  ROS: negative except above  Examination  Vital signs in last 24 hours: Temp:  [97.8 F (36.6 C)-100.6 F (38.1 C)] 98.8 F (37.1 C) (11/12 0759) Pulse Rate:  [85-109] 95 (11/12 0800) Resp:  [10-28] 23 (11/12 0800) BP: (124-158)/(66-100) 136/99 (11/12 0800) SpO2:  [89 %-100 %] 100 % (11/12 0800) Weight:  [69.5 kg] 69.5 kg (11/12 0500)  General: lying in bed, NAD Neuro: Alert, oriented X 3, follows commands, able to name objects, neglecting left side, rest of the cranial nerves appear intact, 4/5 in all 4 extremities  Basic Metabolic Panel: Recent Labs  Lab 06/27/23 2246 06/28/23 0444 06/29/23 0732 06/30/23 0329 07/01/23 0702 07/01/23 1058 07/01/23 1645 07/02/23 0551  NA 136 139 137 136 139  --   --  136  K 3.0* 3.2* 3.6 3.0* 3.0*  --   --  2.9*  CL 88* 98 97* 98 102  --   --  99  CO2 36* 33* 27 22 23   --   --  27  GLUCOSE 108* 97 77 90 97  --   --  162*  BUN 6 <5* 6 8 6   --   --  7  CREATININE 0.64 0.64 0.75 0.67 0.71  --   --  0.48  CALCIUM 8.1* 7.9* 8.3* 8.2* 8.5*  --   --  8.2*  MG 2.0  --  1.9  --  2.1  --  2.0 2.0  PHOS  --   --   --   --   --  2.3* 2.0* 1.5*    CBC: Recent Labs  Lab 06/27/23 2246 06/28/23 0444 06/30/23 0329 07/01/23 0702 07/02/23 0551  WBC 12.8* 10.6* 14.0* 14.4* 12.2*  NEUTROABS 9.2*  --  11.0*  --   --   HGB 12.4 11.5* 13.4 14.5 13.5  HCT 38.0 35.6* 42.1 45.0 41.5  MCV 91.6 91.5 91.5 91.1 87.7  PLT 383 336 372 338 333     Coagulation Studies: No results for input(s): "LABPROT", "INR" in the last 72 hours.  Imaging No new brain imaging overnight   ASSESSMENT AND PLAN: 57 year old female with history of epilepsy who came in with breakthrough seizure in the setting of medication noncompliance.   Epilepsy with breakthrough seizure -Due to medication noncompliance   Recommendations -DC LTM EEG as no further seizures -Continue Keppra 1500 mg  twice daily and Vimpat 200 mg twice daily Will continue phenytoin 100 mg every 8 hours.  Will check trough phenytoin level on Friday.  Of note, meds can be switched to p.o. when tolerated -Continue seizure precautions -as needed IV benzo for clinical seizure -Discussed plan with Dr. Jerral Ralph via secure chat   I have spent a total of  36  minutes with the patient reviewing hospital notes,  test results, labs and examining the patient as well as establishing an assessment and plan.  > 50% of time was spent in direct patient care.    Lindie Spruce Epilepsy [facility] For questions after 5pm please refer to AMION to reach the Neurologist on call

## 2023-07-03 DIAGNOSIS — G9341 Metabolic encephalopathy: Secondary | ICD-10-CM | POA: Diagnosis not present

## 2023-07-03 DIAGNOSIS — M069 Rheumatoid arthritis, unspecified: Secondary | ICD-10-CM | POA: Diagnosis not present

## 2023-07-03 DIAGNOSIS — G40901 Epilepsy, unspecified, not intractable, with status epilepticus: Secondary | ICD-10-CM | POA: Diagnosis not present

## 2023-07-03 DIAGNOSIS — K219 Gastro-esophageal reflux disease without esophagitis: Secondary | ICD-10-CM | POA: Diagnosis not present

## 2023-07-03 LAB — CBC WITH DIFFERENTIAL/PLATELET
Abs Immature Granulocytes: 0.05 10*3/uL (ref 0.00–0.07)
Basophils Absolute: 0.1 10*3/uL (ref 0.0–0.1)
Basophils Relative: 1 %
Eosinophils Absolute: 0.2 10*3/uL (ref 0.0–0.5)
Eosinophils Relative: 2 %
HCT: 37 % (ref 36.0–46.0)
Hemoglobin: 11.7 g/dL — ABNORMAL LOW (ref 12.0–15.0)
Immature Granulocytes: 1 %
Lymphocytes Relative: 20 %
Lymphs Abs: 1.9 10*3/uL (ref 0.7–4.0)
MCH: 28.7 pg (ref 26.0–34.0)
MCHC: 31.6 g/dL (ref 30.0–36.0)
MCV: 90.9 fL (ref 80.0–100.0)
Monocytes Absolute: 1 10*3/uL (ref 0.1–1.0)
Monocytes Relative: 11 %
Neutro Abs: 6.2 10*3/uL (ref 1.7–7.7)
Neutrophils Relative %: 65 %
Platelets: 247 10*3/uL (ref 150–400)
RBC: 4.07 MIL/uL (ref 3.87–5.11)
RDW: 14.7 % (ref 11.5–15.5)
WBC: 9.3 10*3/uL (ref 4.0–10.5)
nRBC: 0 % (ref 0.0–0.2)

## 2023-07-03 LAB — MAGNESIUM: Magnesium: 1.9 mg/dL (ref 1.7–2.4)

## 2023-07-03 LAB — GLUCOSE, CAPILLARY
Glucose-Capillary: 106 mg/dL — ABNORMAL HIGH (ref 70–99)
Glucose-Capillary: 111 mg/dL — ABNORMAL HIGH (ref 70–99)
Glucose-Capillary: 134 mg/dL — ABNORMAL HIGH (ref 70–99)
Glucose-Capillary: 154 mg/dL — ABNORMAL HIGH (ref 70–99)
Glucose-Capillary: 157 mg/dL — ABNORMAL HIGH (ref 70–99)

## 2023-07-03 LAB — BASIC METABOLIC PANEL
Anion gap: 6 (ref 5–15)
BUN: 8 mg/dL (ref 6–20)
CO2: 30 mmol/L (ref 22–32)
Calcium: 8 mg/dL — ABNORMAL LOW (ref 8.9–10.3)
Chloride: 103 mmol/L (ref 98–111)
Creatinine, Ser: 0.43 mg/dL — ABNORMAL LOW (ref 0.44–1.00)
GFR, Estimated: >60 mL/min (ref >60)
Glucose, Bld: 142 mg/dL — ABNORMAL HIGH (ref 70–99)
Potassium: 3.6 mmol/L (ref 3.5–5.1)
Sodium: 139 mmol/L (ref 135–145)

## 2023-07-03 MED ORDER — FOLIC ACID 1 MG PO TABS
1.0000 mg | ORAL_TABLET | Freq: Every day | ORAL | Status: DC
  Administered 2023-07-03 – 2023-07-04 (×2): 1 mg via ORAL
  Filled 2023-07-03 (×2): qty 1

## 2023-07-03 MED ORDER — BANATROL TF EN LIQD
60.0000 mL | Freq: Three times a day (TID) | ENTERAL | Status: DC
  Administered 2023-07-03 – 2023-07-04 (×2): 60 mL via ORAL
  Filled 2023-07-03 (×2): qty 60

## 2023-07-03 MED ORDER — PHENYTOIN 50 MG PO CHEW
100.0000 mg | CHEWABLE_TABLET | Freq: Three times a day (TID) | ORAL | Status: DC
  Administered 2023-07-03 – 2023-07-04 (×3): 100 mg via ORAL
  Filled 2023-07-03 (×5): qty 2

## 2023-07-03 MED ORDER — DULOXETINE HCL 30 MG PO CPEP
30.0000 mg | ORAL_CAPSULE | Freq: Every day | ORAL | Status: DC
  Administered 2023-07-03 – 2023-07-04 (×2): 30 mg via ORAL
  Filled 2023-07-03 (×2): qty 1

## 2023-07-03 MED ORDER — PROSOURCE PLUS PO LIQD
30.0000 mL | Freq: Two times a day (BID) | ORAL | Status: DC
  Administered 2023-07-03 – 2023-07-04 (×2): 30 mL via ORAL
  Filled 2023-07-03 (×2): qty 30

## 2023-07-03 MED ORDER — THIAMINE MONONITRATE 100 MG PO TABS
100.0000 mg | ORAL_TABLET | Freq: Every day | ORAL | Status: DC
  Administered 2023-07-04: 100 mg via ORAL
  Filled 2023-07-03: qty 1

## 2023-07-03 MED ORDER — LACOSAMIDE 50 MG PO TABS
200.0000 mg | ORAL_TABLET | Freq: Two times a day (BID) | ORAL | Status: DC
  Administered 2023-07-03 – 2023-07-04 (×2): 200 mg via ORAL
  Filled 2023-07-03 (×2): qty 4

## 2023-07-03 MED ORDER — ADULT MULTIVITAMIN W/MINERALS CH
1.0000 | ORAL_TABLET | Freq: Every day | ORAL | Status: DC
  Administered 2023-07-04: 1 via ORAL
  Filled 2023-07-03: qty 1

## 2023-07-03 MED ORDER — LACOSAMIDE 50 MG PO TABS: 200.0000 mg | ORAL_TABLET | Freq: Two times a day (BID) | ORAL | Status: DC

## 2023-07-03 MED ORDER — LEVETIRACETAM 500 MG PO TABS
1500.0000 mg | ORAL_TABLET | Freq: Two times a day (BID) | ORAL | Status: DC
  Administered 2023-07-03 – 2023-07-04 (×2): 1500 mg via ORAL
  Filled 2023-07-03 (×2): qty 3

## 2023-07-03 MED ORDER — LEVETIRACETAM 500 MG PO TABS: 1500.0000 mg | ORAL_TABLET | Freq: Two times a day (BID) | ORAL | Status: DC

## 2023-07-03 NOTE — Plan of Care (Signed)

## 2023-07-03 NOTE — Progress Notes (Signed)
PROGRESS NOTE        PATIENT DETAILS Name: Abigail [name] [facility] Age: 57 y.o. Sex: female Date of Birth: 1966-04-06 Admit Date: 06/28/2023 Admitting Physician Tereasa Coop, MD [id number], [name], MD  Brief Summary: Patient is a 57 y.o.  female with history of RA/GCA-no longer on prednisone, chronic pain on opiates, seizure disorder noncompliant with AEDs-presented with altered mental status-thought to have status epilepticus-transferred from [facility] to [facility] for LTM EEG.  Neurology followed closely-AEDs adjusted-thankfully on 11/12-mental status started to improve.  See below for further details.  Significant events: 11/8>> admit to [facility] 11/10>> transfer to ICU-for close monitoring-at risk for aspiration 11/11>>cortak tube placed-tube feeds started 11/12>> marked improvement in mental status-more awake-alert-following some commands.  Transfer to progressive care.  Significant studies: 11/7>> CXR: Bibasilar atelectasis 11/7>> CT head: No acute intracranial abnormality 11/8-11/9>> LTM EEG: No seizures 11/9>> LTM EEG: Periodic discharges with triphasic morphology 11/10>> LTM EEG: Epileptogenicity arising from right parieto-occipital area 11/11>> LTM EEG: Evidence of epileptogenicity arising from the right parieto-occipital region 11/12>> LTM EEG: Evidence of epileptogenicity from the right parieto-occipital area  Significant microbiology data: None  Procedures: None  Consults: Neurology  Subjective: Continues to slowly improve-answering most of my questions appropriately-awake/alert.  Following all my commands.  Objective: Vitals: Blood pressure 113/76, pulse 96, temperature 97.7 F (36.5 C), temperature source Oral, resp. rate 19, height 5' 1.81" (1.57 m), weight 69.5 kg, SpO2 95%.   Exam: Gen Exam:Alert awake-not in any distress HEENT:atraumatic, normocephalic Chest: B/L clear to auscultation anteriorly CVS:S1S2 regular Abdomen:soft non  tender, non distended Extremities:no edema Neurology: Non focal-but has generalized weakness. Skin: no rash  Pertinent Labs/Radiology:    Latest Ref Rng & Units 07/02/2023    5:51 AM 07/01/2023    7:02 AM 06/30/2023    3:29 AM  CBC  WBC 4.0 - 10.5 K/uL 12.2  14.4  14.0   Hemoglobin 12.0 - 15.0 g/dL 03.4  74.2  59.5   Hematocrit 36.0 - 46.0 % 41.5  45.0  42.1   Platelets 150 - 400 K/uL 333  338  372     Lab Results  Component Value Date   NA 136 07/02/2023   K 2.9 (L) 07/02/2023   CL 99 07/02/2023   CO2 27 07/02/2023      Assessment/Plan: Status epilepticus  Known history of seizures-noncompliance to AEDs.   Had severe encephalopathy for several days since admit to [facility] improving on 11/12-continues to slowly improve Remains on Keppra/Vimpat/phenytoin Neurology following-await further recommendations.  Acute metabolic encephalopathy Significant improvement since 11/12-continues to be slightly better today compared to yesterday. Continue  AEDs  Probable aspiration pneumonia Vomited/accumulated secretions when she was encephalopathic-likely has aspiration pneumonia even though CXR negative Overall improved-leukocytosis trending down-no low-grade fever overnight Unasyn x 5 days Strict aspiration precautions.  Hypokalemia Repleted 11/12-awaiting a.m. labs  Hypophosphatemia Repleted 11/12-awaiting a.m. labs  Oropharyngeal dysphagia in the setting of encephalopathy Encephalopathy better-NG tube feeding ongoing-SLP follow-up pending-resume diet accordingly.   History of RA/giant cell arteritis No longer on steroids-per last outpatient rheumatology note.  Chronic pain syndrome-on chronic narcotics Fibromyalgia All narcotics/Cymbalta held due to confusion/encephalopathy-however since encephalopathy clearing-as needed oxycodone has been resumed. Resume Cymbalta 11/13.  Chronic pancreatitis Stable Supportive care for  now Follow  Debility/deconditioning PT/OT eval-probably may require SNF  BMI: Estimated body mass index is 28.2 kg/m as calculated from  the following:   Height as of this encounter: 5' 1.81" (1.57 m).   Weight as of this encounter: 69.5 kg.   Code status:   Code Status: Full Code   DVT Prophylaxis: enoxaparin (LOVENOX) injection 40 mg Start: 06/28/23 1600 SCDs Start: 06/28/23 0404 Place TED hose Start: 06/28/23 0404   Family Communication:  Mother-Barbara charge-[phone] on 11/12-updated over the phone  Disposition Plan: Status is: Inpatient Remains inpatient appropriate because: Severity of illness   Planned Discharge Destination:Home vsSNF   Diet: Diet Order             Diet NPO time specified  Diet effective now                     Antimicrobial agents: Anti-infectives (From admission, onward)    Start     Dose/Rate Route Frequency Ordered Stop   07/01/23 0800  Ampicillin-Sulbactam (UNASYN) 3 g in sodium chloride 0.9 % 100 mL IVPB        3 g 200 mL/hr over 30 Minutes Intravenous Every 6 hours 07/01/23 0647          MEDICATIONS: Scheduled Meds:  Chlorhexidine Gluconate Cloth  6 each Topical Daily   enoxaparin (LOVENOX) injection  40 mg Subcutaneous Q24H   feeding supplement (PROSource TF20)  60 mL Per Tube BID   fiber supplement (BANATROL TF)  60 mL Per Tube TID   multivitamin with minerals  1 tablet Per Tube Daily   mouth rinse  15 mL Mouth Rinse 4 times per day   pantoprazole (PROTONIX) IV  40 mg Intravenous Q24H   phenytoin (DILANTIN) IV  100 mg Intravenous TID   sodium chloride flush  10-40 mL Intracatheter Q12H   sodium chloride flush  3 mL Intravenous Q12H   thiamine  100 mg Per Tube Daily   Continuous Infusions:  ampicillin-sulbactam (UNASYN) IV 3 g (07/03/23 0832)   feeding supplement (OSMOLITE 1.5 CAL) 55 mL/hr at 07/02/23 1200   lacosamide (VIMPAT) IV 200 mg (07/03/23 0922)   levETIRAcetam 1,500 mg (07/03/23 0833)   PRN  Meds:.albuterol, dextrose, LORazepam, ondansetron **OR** ondansetron (ZOFRAN) IV, mouth rinse, oxyCODONE **OR** oxyCODONE, sodium chloride flush, sodium chloride flush   I have personally reviewed following labs and imaging studies  LABORATORY DATA: CBC: Recent Labs  Lab 06/27/23 2246 06/28/23 0444 06/30/23 0329 07/01/23 0702 07/02/23 0551  WBC 12.8* 10.6* 14.0* 14.4* 12.2*  NEUTROABS 9.2*  --  11.0*  --   --   HGB 12.4 11.5* 13.4 14.5 13.5  HCT 38.0 35.6* 42.1 45.0 41.5  MCV 91.6 91.5 91.5 91.1 87.7  PLT 383 336 372 338 333    Basic Metabolic Panel: Recent Labs  Lab 06/27/23 2246 06/28/23 0444 06/29/23 0732 06/30/23 0329 07/01/23 0702 07/01/23 1058 07/01/23 1645 07/02/23 0551  NA 136 139 137 136 139  --   --  136  K 3.0* 3.2* 3.6 3.0* 3.0*  --   --  2.9*  CL 88* 98 97* 98 102  --   --  99  CO2 36* 33* 27 22 23   --   --  27  GLUCOSE 108* 97 77 90 97  --   --  162*  BUN 6 <5* 6 8 6   --   --  7  CREATININE 0.64 0.64 0.75 0.67 0.71  --   --  0.48  CALCIUM 8.1* 7.9* 8.3* 8.2* 8.5*  --   --  8.2*  MG 2.0  --  1.9  --  2.1  --  2.0 2.0  PHOS  --   --   --   --   --  2.3* 2.0* 1.5*    GFR: Estimated Creatinine Clearance: 69.7 mL/min (by C-G formula based on SCr of 0.48 mg/dL).  Liver Function Tests: Recent Labs  Lab 06/27/23 2246 06/28/23 0444 07/01/23 1058 07/02/23 0551  AST 22 20  --  25  ALT 11 11  --  16  ALKPHOS 134* 111  --  96  BILITOT 0.6 0.7  --  0.5  PROT 7.2 6.0*  --  6.6  ALBUMIN 2.4* 1.9* 2.3* 2.3*   Recent Labs  Lab 06/27/23 2245  LIPASE 57*   Recent Labs  Lab 06/28/23 0444  AMMONIA <10    Coagulation Profile: No results for input(s): "INR", "PROTIME" in the last 168 hours.  Cardiac Enzymes: No results for input(s): "CKTOTAL", "CKMB", "CKMBINDEX", "TROPONINI" in the last 168 hours.  BNP (last 3 results) No results for input(s): "PROBNP" in the last 8760 hours.  Lipid Profile: No results for input(s): "CHOL", "HDL", "LDLCALC",  "TRIG", "CHOLHDL", "LDLDIRECT" in the last 72 hours.  Thyroid Function Tests: No results for input(s): "TSH", "T4TOTAL", "FREET4", "T3FREE", "THYROIDAB" in the last 72 hours.  Anemia Panel: No results for input(s): "VITAMINB12", "FOLATE", "FERRITIN", "TIBC", "IRON", "RETICCTPCT" in the last 72 hours.  Urine analysis:    Component Value Date/Time   COLORURINE YELLOW 12/24/2022 1551   APPEARANCEUR CLEAR 12/24/2022 1551   APPEARANCEUR Clear 05/24/2014 1903   LABSPEC 1.012 12/24/2022 1551   LABSPEC 1.015 05/24/2014 1903   PHURINE 8.0 12/24/2022 1551   GLUCOSEU NEGATIVE 12/24/2022 1551   GLUCOSEU Negative 05/24/2014 1903   HGBUR NEGATIVE 12/24/2022 1551   BILIRUBINUR NEGATIVE 12/24/2022 1551   BILIRUBINUR Negative 05/24/2014 1903   KETONESUR NEGATIVE 12/24/2022 1551   PROTEINUR NEGATIVE 12/24/2022 1551   NITRITE NEGATIVE 12/24/2022 1551   LEUKOCYTESUR NEGATIVE 12/24/2022 1551   LEUKOCYTESUR Negative 05/24/2014 1903    Sepsis Labs: Lactic Acid, Venous    Component Value Date/Time   LATICACIDVEN 1.9 12/07/2022 2051    MICROBIOLOGY: Recent Results (from the past 240 hour(s))  MRSA Next Gen by PCR, Nasal     Status: None   Collection Time: 06/30/23  4:47 PM   Specimen: Nasal Mucosa; Nasal Swab  Result Value Ref Range Status   MRSA by PCR Next Gen NOT DETECTED NOT DETECTED Final    Comment: (NOTE) The GeneXpert MRSA Assay (FDA approved for NASAL specimens only), is one component of a comprehensive MRSA colonization surveillance program. It is not intended to diagnose MRSA infection nor to guide or monitor treatment for MRSA infections. Test performance is not FDA approved in patients less than 4 years old. Performed at [facility], [address] [place], [place] [address]     RADIOLOGY STUDIES/RESULTS: [place] EKG SITE RITE  Result Date: 07/02/2023 If Site Rite image not attached, placement could not be confirmed due to current cardiac rhythm.  DG Abd Portable  1V  Result Date: 07/01/2023 CLINICAL DATA:  57 year old female feeding tube placement. EXAM: PORTABLE ABDOMEN - 1 VIEW COMPARISON:  Portable abdomen this morning, CT Abdomen and Pelvis 12/14/2022. FINDINGS: Portable AP view at 1007 hours. Enteric feeding tube placed through the stomach and terminating in the right upper quadrant at the level of the proximal duodenum when correlating to previous CT Abdomen and Pelvis coronal view. Increased gastric air, but visible bowel-gas pattern remains non obstructed. Negative lung bases. IMPRESSION: Enteric feeding tube  placed with tip at the proximal duodenum. Electronically Signed   By: Odessa Fleming M.D.   On: 07/01/2023 10:16     LOS: 5 days   Jeoffrey Massed, MD  [facility]    To contact the attending provider between 7A-7P or the covering provider during after hours 7P-7A, please log into the web site [email or web address] and access using universal  password for that web site. If you do not have the password, please call the hospital operator.  07/03/2023, 9:24 AM

## 2023-07-03 NOTE — Progress Notes (Signed)
Subjective: No acute events overnight.  No new concerns.  ROS: negative except above Examination  Vital signs in last 24 hours: Temp:  [97.5 F (36.4 C)-99.8 F (37.7 C)] 97.7 F (36.5 C) (11/13 0736) Pulse Rate:  [90-102] 96 (11/13 0345) Resp:  [11-27] 14 (11/13 0800) BP: (109-140)/(60-78) 140/75 (11/13 0800) SpO2:  [93 %-100 %] 95 % (11/13 0345) Weight:  [69.5 kg] 69.5 kg (11/13 0158)  General: lying in bed, NAD Neuro: Alert, oriented X 3, follows commands, able to name objects, left-sided neglect appears to be improving but still prefers the right side, rest of the cranial nerves appear intact, 4/5 in all 4 extremities  Basic Metabolic Panel: Recent Labs  Lab 06/27/23 2246 06/28/23 0444 06/29/23 0732 06/30/23 0329 07/01/23 0702 07/01/23 1058 07/01/23 1645 07/02/23 0551  NA 136 139 137 136 139  --   --  136  K 3.0* 3.2* 3.6 3.0* 3.0*  --   --  2.9*  CL 88* 98 97* 98 102  --   --  99  CO2 36* 33* 27 22 23   --   --  27  GLUCOSE 108* 97 77 90 97  --   --  162*  BUN 6 <5* 6 8 6   --   --  7  CREATININE 0.64 0.64 0.75 0.67 0.71  --   --  0.48  CALCIUM 8.1* 7.9* 8.3* 8.2* 8.5*  --   --  8.2*  MG 2.0  --  1.9  --  2.1  --  2.0 2.0  PHOS  --   --   --   --   --  2.3* 2.0* 1.5*    CBC: Recent Labs  Lab 06/27/23 2246 06/28/23 0444 06/30/23 0329 07/01/23 0702 07/02/23 0551 07/03/23 0926  WBC 12.8* 10.6* 14.0* 14.4* 12.2* 9.3  NEUTROABS 9.2*  --  11.0*  --   --  6.2  HGB 12.4 11.5* 13.4 14.5 13.5 11.7*  HCT 38.0 35.6* 42.1 45.0 41.5 37.0  MCV 91.6 91.5 91.5 91.1 87.7 90.9  PLT 383 336 372 338 333 247     Coagulation Studies: No results for input(s): "LABPROT", "INR" in the last 72 hours.  Imaging No new brain imaging overnight  ASSESSMENT AND PLAN: 57 year old female with history of epilepsy who came in with breakthrough seizure in the setting of medication noncompliance.   Epilepsy with breakthrough seizure -Due to medication noncompliance    Recommendations -Continue Keppra 1500 mg twice daily and Vimpat 200 mg twice daily Will continue phenytoin 100 mg every 8 hours.  Will check trough phenytoin level on Friday if in the hospital.  Switching meds toper tube -Rescue medication: Intranasal Valtoco 15 mg for seizure lasting more than 2 minutes.  If not covered by insurance, can prescribe clonazepam 2 mg instead -Continue seizure precautions including no driving for 6 months -Discussed importance of medication compliance with patient.  Patient states she understands -as needed IV benzo for clinical seizure -Discussed plan with Dr. Jerral Ralph via secure chat -Follow-up with neurology Dr. Sherryll Burger at [facility] in 3 months  Seizure precautions: Per [facility] statutes, patients with seizures are not allowed to drive until they have been seizure-free for six months and cleared by a physician    Use caution when using heavy equipment or power tools. Avoid working on ladders or at heights. Take showers instead of baths. Ensure the water temperature is not too high on the home water heater. Do not go swimming alone. Do  not lock yourself in a room alone (i.e. bathroom). When caring for infants or small children, sit down when holding, feeding, or changing them to minimize risk of injury to the child in the event you have a seizure. Maintain good sleep hygiene. Avoid alcohol.    If patient has another seizure, call 911 and bring them back to the ED if: A.  The seizure lasts longer than 5 minutes.      B.  The patient doesn't wake shortly after the seizure or has new problems such as difficulty seeing, speaking or moving following the seizure C.  The patient was injured during the seizure D.  The patient has a temperature over 102 F (39C) E.  The patient vomited during the seizure and now is having trouble breathing    During the Seizure   - First, ensure adequate ventilation and place patients on the floor on their left side   Loosen clothing around the neck and ensure the airway is patent. If the patient is clenching the teeth, do not force the mouth open with any object as this can cause severe damage - Remove all items from the surrounding that can be hazardous. The patient may be oblivious to what's happening and may not even know what he or she is doing. If the patient is confused and wandering, either gently guide him/her away and block access to outside areas - Reassure the individual and be comforting - Call 911. In most cases, the seizure ends before EMS arrives. However, there are cases when seizures may last over 3 to 5 minutes. Or the individual may have developed breathing difficulties or severe injuries. If a pregnant patient or a person with diabetes develops a seizure, it is prudent to call an ambulance.    After the Seizure (Postictal Stage)   After a seizure, most patients experience confusion, fatigue, muscle pain and/or a headache. Thus, one should permit the individual to sleep. For the next few days, reassurance is essential. Being calm and helping reorient the person is also of importance.   Most seizures are painless and end spontaneously. Seizures are not harmful to others but can lead to complications such as stress on the lungs, brain and the heart. Individuals with prior lung problems may develop labored breathing and respiratory distress.    I have spent a total of  35 minutes with the patient reviewing hospital notes,  test results, labs and examining the patient as well as establishing an assessment and plan.  > 50% of time was spent in direct patient care.    Lindie Spruce Epilepsy [facility] For questions after 5pm please refer to AMION to reach the Neurologist on call

## 2023-07-03 NOTE — Progress Notes (Signed)
Inpatient Rehab Admissions Coordinator Note:   Per PT recommendations patient was screened for CIR candidacy by Stephania Fragmin, PT. At this time, pt appears to be a potential candidate for CIR. I will place an order for rehab consult for full assessment, per our protocol.  Please contact me any with questions.Estill Dooms, PT, DPT [phone] 07/03/23 3:41 PM

## 2023-07-03 NOTE — Progress Notes (Signed)
Speech Language Pathology Treatment: Dysphagia  Patient Details Name: Abigail [name] MRN: [id number] DOB: 27-Dec-1965 Today's Date: 07/03/2023 Time: 3810-1751 SLP Time Calculation (min) (ACUTE ONLY): 23 min  Assessment / Plan / Recommendation Clinical Impression  Patient seen by SLP for skilled intervention focused on PO trials. Patient with increased alertness today as compared to previous date, however lethargy and confusion persists. When speaking to her son on the phone, patient stated that she wanted to go outside to "see her babies." SLP administered trials of thin liquid (water) via cup and straw and puree (applesauce). Patient tolerance of thin liquid improved from previous date, as no overt s/sx aspiration noted with cups sips. Straw sip resulted in one instance of immediate cough but once repositioned more upright, no further coughing observed. Swallow initiation continues to appear delayed with both thin and pureed consistencies. Given patient's intermittent levels of alertness, SLP recommending initiate full liquid diet with full supervision. Medications may be administered crushed in puree. ST to continue following for diet toleration and advancement as indicated.  HPI HPI: Patient is a 57 y.o. femael who presented on 11/08 wtih AMS, thought to have status epileptius. PHM is signficant for giant cell arteritis no longer on steroids, seizure disorder noncompliant with AEDs, basal cell carcinoma, HTN, HLD, fibromyalgia, and anxiety. Waxing and waning mentation.      SLP Plan  Continue with current plan of care      Recommendations for follow up therapy are one component of a multi-disciplinary discharge planning process, led by the attending physician.  Recommendations may be updated based on patient status, additional functional criteria and insurance authorization.    Recommendations  Diet recommendations: Thin liquid;Other(comment) (Full liquids) Liquids provided via:  Cup;Straw Medication Administration: Crushed with puree Supervision: Full supervision/cueing for compensatory strategies;Staff to assist with self feeding Compensations: Minimize environmental distractions;Small sips/bites;Slow rate Postural Changes and/or Swallow Maneuvers: Seated upright 90 degrees;Upright 30-60 min after meal                  Oral care BID   Frequent or constant Supervision/Assistance Dysphagia, unspecified (R13.10)     Continue with current plan of care     Marline Backbone, Senaida Lange., Speech Therapy Student    07/03/2023, 11:29 AM

## 2023-07-03 NOTE — Evaluation (Signed)
Physical Therapy Evaluation Patient Details Name: Abigail [name] [facility] MRN: [id number] DOB: 1965-12-17 Today's Date: 07/03/2023  History of Present Illness  57 y.o. female [facility] hospital on [facility]/27/2023 with AMS, concern for possible seizures. 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 PMH includes epilepsy, RA, giant cell arteritis, chronic pain syndrome.  Clinical Impression  Pt presents to PT with deficits in cognition, balance, strength, power, endurance. Pt demonstrates slowed processing and impaired initiation throughout session. Pt is unable to provide a full history due to impaired memory. Pt demonstrates a R lateral and posterior lean when upright, and requires physical assistance to perform all functional mobility at this time. Pt is at a high risk for falls due to impaired balance and cognition. Patient will benefit from intensive inpatient follow up therapy, >3 hours/day.        If plan is discharge home, recommend the following: A lot of help with walking and/or transfers;A lot of help with bathing/dressing/bathroom;Assistance with cooking/housework;Assistance with feeding;Direct supervision/assist for medications management;Direct supervision/assist for financial management;Assist for transportation;Help with stairs or ramp for entrance;Supervision due to cognitive status   Can travel by private vehicle        Equipment Recommendations Wheelchair (measurements PT);BSC/3in1  Recommendations for Other Services  Rehab consult    Functional Status Assessment Patient has had a recent decline in their functional status and demonstrates the ability to make significant improvements in function in a reasonable and predictable amount of time.     Precautions / Restrictions Precautions Precautions: Fall Precaution Comments: cortrak, flexiseal Restrictions Weight Bearing Restrictions: No      Mobility  Bed  Mobility Overal bed mobility: Needs Assistance Bed Mobility: Supine to Sit     Supine to sit: Mod assist, HOB elevated          Transfers Overall transfer level: Needs assistance Equipment used: 2 person hand held assist Transfers: Sit to/from Stand, Bed to chair/wheelchair/BSC Sit to Stand: Mod assist   Step pivot transfers: Mod assist       General transfer comment: pt with posterior and right lean in standing    Ambulation/Gait Ambulation/Gait assistance:  (deferred due to instability with transfer)                Stairs            Wheelchair Mobility     Tilt Bed    Modified Rankin (Stroke Patients Only)       Balance Overall balance assessment: Needs assistance Sitting-balance support: Single extremity supported, Feet supported Sitting balance-Leahy Scale: Poor Sitting balance - Comments: right lateral lean Postural control: Right lateral lean Standing balance support: Bilateral upper extremity supported Standing balance-Leahy Scale: Poor Standing balance comment: min-modA, posterior and R lean                             Pertinent Vitals/Pain Pain Assessment Pain Assessment: No/denies pain    Home Living Family/patient expects to be discharged to:: Private residence Living Arrangements: Children Available Help at Discharge: Family;Available PRN/intermittently Type of Home: Mobile home Home Access: Ramped entrance       Home Layout: One level Home Equipment:  (pt reports owning a walker, unable to provide information on the type of walker. She denies having a wheelchair although PT eval in may reports a wheelchair)      Prior Function Prior Level of Function : Needs assist  Mobility Comments: ambulates with support of RW in the home ADLs Comments: prior PT notes indicate the pt required assistance for bathing and IADLs, pt is unable to report assistance needs prior to this admission      Extremity/Trunk Assessment   Upper Extremity Assessment Upper Extremity Assessment: Generalized weakness    Lower Extremity Assessment Lower Extremity Assessment: Generalized weakness    Cervical / Trunk Assessment Cervical / Trunk Assessment: Kyphotic  Communication   Communication Communication: No apparent difficulties Cueing Techniques: Verbal cues  Cognition Arousal: Alert Behavior During Therapy: WFL for tasks assessed/performed Overall Cognitive Status: Impaired/Different from baseline Area of Impairment: Attention, Memory, Following commands, Safety/judgement, Awareness, Problem solving                   Current Attention Level: Sustained Memory: Decreased recall of precautions, Decreased short-term memory Following Commands: Follows one step commands with increased time Safety/Judgement: Decreased awareness of safety, Decreased awareness of deficits Awareness: Intellectual Problem Solving: Slow processing, Decreased initiation, Requires verbal cues          General Comments General comments (skin integrity, edema, etc.): VSS on RA    Exercises     Assessment/Plan    PT Assessment Patient needs continued PT services  PT Problem List Decreased strength;Decreased balance;Decreased activity tolerance;Decreased mobility;Decreased cognition;Decreased knowledge of use of DME;Decreased safety awareness;Decreased knowledge of precautions       PT Treatment Interventions DME instruction;Gait training;Functional mobility training;Therapeutic activities;Therapeutic exercise;Balance training;Neuromuscular re-education;Cognitive remediation;Patient/family education;Wheelchair mobility training    PT Goals (Current goals can be found in the Care Plan section)  Acute Rehab PT Goals Patient Stated Goal: to return to prior level of function PT Goal Formulation: With patient Time For Goal Achievement: 07/17/23 Potential to Achieve Goals: Fair    Frequency Min  1X/week     Co-evaluation               AM-PAC PT "6 Clicks" Mobility  Outcome Measure Help needed turning from your back to your side while in a flat bed without using bedrails?: A Lot Help needed moving from lying on your back to sitting on the side of a flat bed without using bedrails?: A Lot Help needed moving to and from a bed to a chair (including a wheelchair)?: A Lot Help needed standing up from a chair using your arms (e.g., wheelchair or bedside chair)?: A Lot Help needed to walk in hospital room?: Total Help needed climbing 3-5 steps with a railing? : Total 6 Click Score: 10    End of Session   Activity Tolerance: Patient tolerated treatment well Patient left: in chair;with call bell/phone within reach;with chair alarm set Nurse Communication: Mobility status PT Visit Diagnosis: Other abnormalities of gait and mobility (R26.89);Muscle weakness (generalized) (M62.81)    Time: 0981-1914 PT Time Calculation (min) (ACUTE ONLY): 27 min   Charges:   PT Evaluation $PT Eval Low Complexity: 1 Low   PT General Charges $$ ACUTE PT VISIT: 1 Visit         Arlyss Gandy, PT, DPT Acute Rehabilitation Office [phone]   Arlyss Gandy 07/03/2023, 12:38 PM

## 2023-07-03 NOTE — Evaluation (Signed)
Occupational Therapy Evaluation Patient Details Name: Abigail [name] [facility] MRN: [id number] DOB: [birth year] Today's Date: 07/03/2023   History of Present Illness 57 y.o. female presents to [facility] hospital on 06/27/2023 with AMS, concern for possible seizures. 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 PMH includes epilepsy, RA, giant cell arteritis, chronic pain syndrome.   Clinical Impression   Pt not feeling well, c/o pain in head/back, 6/10. Pt lives with son who is available 24/7, son does cooking/cleaning, Pt states she was ind with ADLs, used rollator occasionally prior to admission. Pt currently displays moderate difficulty with following commands, not aware of deficits, decreased attention to task, able to respond ~75% of time. Overall A/Ox4, but unaware that her B hands are numb and had reduced proprioception, RUE external rotation, elbow flexion, and FM skills significantly limited, weak, little to now AROM when completing tasks.  Pt displays R lateral lean and posterior lean when sitting/standing. Pt able to complete bed mobility min A, ADLs with mod A, and good power with standing, has R lateral lean but able to take small steps at bedside without LOB using RW and therapist support.  Pt would benefit greatly from postacute intensive rehab >3hrs/day to maximize progress, will continue to see acutely.       If plan is discharge home, recommend the following: A lot of help with walking and/or transfers;A lot of help with bathing/dressing/bathroom;Assistance with cooking/housework;Assistance with feeding;Direct supervision/assist for medications management;Assist for transportation;Help with stairs or ramp for entrance;Supervision due to cognitive status    Functional Status Assessment  Patient has had a recent decline in their functional status and demonstrates the ability to make significant improvements in function in a reasonable and  predictable amount of time.  Equipment Recommendations  Other (comment) (defer)    Recommendations for Other Services Rehab consult     Precautions / Restrictions Precautions Precautions: Fall Precaution Comments: cortrak, flexiseal Restrictions Weight Bearing Restrictions: No      Mobility Bed Mobility Overal bed mobility: Needs Assistance Bed Mobility: Supine to Sit, Sit to Supine     Supine to sit: Min assist, HOB elevated Sit to supine: Min assist, HOB elevated   General bed mobility comments: Pt min A for in/out of bed, has R lateral lean, not able to tell when straight in the bed    Transfers Overall transfer level: Needs assistance Equipment used: Rolling walker (2 wheels) Transfers: Sit to/from Stand Sit to Stand: Mod assist     Step pivot transfers: Mod assist     General transfer comment: posterior/R lean, mod A, difficulty gripping RW with R hand, good power to stand but poor balance      Balance Overall balance assessment: Needs assistance Sitting-balance support: Single extremity supported, Feet supported Sitting balance-Leahy Scale: Poor Sitting balance - Comments: right lateral lean Postural control: Right lateral lean Standing balance support: Bilateral upper extremity supported Standing balance-Leahy Scale: Poor Standing balance comment: min-modA, posterior and R lean                           ADL either performed or assessed with clinical judgement   ADL Overall ADL's : Needs assistance/impaired Eating/Feeding: Minimal assistance;Sitting   Grooming: Minimal assistance;Sitting   Upper Body Bathing: Moderate assistance;Sitting   Lower Body Bathing: Moderate assistance;Sitting/lateral leans   Upper Body Dressing : Moderate assistance;Sitting   Lower Body Dressing: Moderate assistance;Sitting/lateral leans   Toilet Transfer: Moderate assistance;Stand-pivot;Rolling walker (2 wheels);BSC/3in1  Toileting- Clothing Manipulation  and Hygiene: Moderate assistance;Sitting/lateral lean         General ADL Comments: Pt mod A for most ADLs, decreased use of R hand limits partcipation in activities, decreased ability to initiate ADLs when asked, numbness to both hands.     Vision Ability to See in Adequate Light: 1 Impaired Patient Visual Report: Other (comment) (difficult to assess, may be due to cognitive impairment)       Perception         Praxis         Pertinent Vitals/Pain Pain Assessment Pain Assessment: 0-10 Pain Score: 6  Pain Location: back/neck and head pain Pain Descriptors / Indicators: Aching, Constant, Grimacing Pain Intervention(s): Monitored during session     Extremity/Trunk Assessment Upper Extremity Assessment Upper Extremity Assessment: LUE deficits/detail;RUE deficits/detail RUE Deficits / Details: Pt has little to no FM skills in r hand, able to complete gross grip with fair strength, but not able to feel R hand, not able to complete tip to tip pinch. Difficulty with elbow flexion and external rotation, sluggish, weak, delayed. RUE Sensation: decreased light touch;decreased proprioception RUE Coordination: decreased fine motor LUE Deficits / Details: decreased FM skills, not able to complete tip to tip pinch, gross grip okay, able to hold RW with L hand. LUE Sensation: decreased light touch;decreased proprioception LUE Coordination: decreased fine motor   Lower Extremity Assessment Lower Extremity Assessment: Defer to PT evaluation       Communication Communication Communication: No apparent difficulties   Cognition Arousal: Alert Behavior During Therapy: Anxious Overall Cognitive Status: Impaired/Different from baseline Area of Impairment: Attention, Memory, Following commands, Safety/judgement, Awareness, Problem solving                   Current Attention Level: Sustained Memory: Decreased recall of precautions, Decreased short-term memory Following Commands:  Follows one step commands with increased time Safety/Judgement: Decreased awareness of safety, Decreased awareness of deficits Awareness: Intellectual Problem Solving: Slow processing, Decreased initiation, Requires verbal cues General Comments: Pt A/O x4, but had difficulty following certain commands. PT able to complete gross movement and respond to ~75% of questions, other times Pt stares blankly. Pt not aware of current deficits     General Comments       Exercises     Shoulder Instructions      Home Living Family/patient expects to be discharged to:: Private residence Living Arrangements: Children Available Help at Discharge: Family;Available PRN/intermittently Type of Home: Mobile home Home Access: Ramped entrance     Home Layout: One level     Bathroom Shower/Tub: Producer, television/film/video: Standard     Home Equipment: Agricultural consultant (2 wheels);Wheelchair - Set designer (4 wheels) (Pt states w/c never arrived from prior admission)   Additional Comments: lives with son who is there 24/7      Prior Functioning/Environment Prior Level of Function : Needs assist             Mobility Comments: Pt states she occasionally uses rollator in home ADLs Comments: Pt reports being ind with ADLs, son assists with cooking/cleaning        OT Problem List: Decreased strength;Decreased range of motion;Decreased activity tolerance;Impaired balance (sitting and/or standing);Impaired vision/perception;Impaired UE functional use;Pain;Impaired sensation;Impaired tone;Decreased safety awareness;Decreased cognition      OT Treatment/Interventions: Self-care/ADL training;Therapeutic exercise;Neuromuscular education;Energy conservation;DME and/or AE instruction;Manual therapy;Therapeutic activities;Cognitive remediation/compensation;Patient/family education;Balance training    OT Goals(Current goals can be found in the care plan section) Acute Rehab  OT  Goals Patient Stated Goal: to go home OT Goal Formulation: With patient Time For Goal Achievement: 07/17/23 Potential to Achieve Goals: Good  OT Frequency: Min 1X/week    Co-evaluation              AM-PAC OT "6 Clicks" Daily Activity     Outcome Measure Help from another person eating meals?: A Little Help from another person taking care of personal grooming?: A Lot Help from another person toileting, which includes using toliet, bedpan, or urinal?: A Lot Help from another person bathing (including washing, rinsing, drying)?: A Lot Help from another person to put on and taking off regular upper body clothing?: A Lot Help from another person to put on and taking off regular lower body clothing?: A Lot 6 Click Score: 13   End of Session Equipment Utilized During Treatment: Gait belt;Rolling walker (2 wheels) Nurse Communication: Mobility status;Patient requests pain meds  Activity Tolerance: Patient tolerated treatment well Patient left: in bed;with call bell/phone within reach;with bed alarm set  OT Visit Diagnosis: Unsteadiness on feet (R26.81);Other abnormalities of gait and mobility (R26.89);Muscle weakness (generalized) (M62.81);Repeated falls (R29.6);Other symptoms and signs involving the nervous system (R29.898);Other symptoms and signs involving cognitive function;Pain Pain - part of body:  (back)                Time: 4540-9811 OT Time Calculation (min): 34 min Charges:  OT General Charges $OT Visit: 1 Visit OT Evaluation $OT Eval Moderate Complexity: 1 Mod OT Treatments $Self Care/Home Management : 8-22 mins  [place], OTR/L   Alexis Goodell 07/03/2023, 4:55 PM

## 2023-07-03 NOTE — Care Management Important Message (Signed)
Important Message  Patient Details  Name: Abigail [name] [facility] MRN: [id number] Date of Birth: 01/26/1966   Important Message Given:  Yes - Medicare IM Patient sleep provided the IM to the Case Manager to give to the patient     Dorena Bodo 07/03/2023, 3:35 PM

## 2023-07-04 ENCOUNTER — Other Ambulatory Visit (HOSPITAL_COMMUNITY): Payer: Self-pay

## 2023-07-04 DIAGNOSIS — G9341 Metabolic encephalopathy: Secondary | ICD-10-CM | POA: Diagnosis not present

## 2023-07-04 DIAGNOSIS — G40901 Epilepsy, unspecified, not intractable, with status epilepticus: Secondary | ICD-10-CM | POA: Diagnosis not present

## 2023-07-04 DIAGNOSIS — M316 Other giant cell arteritis: Secondary | ICD-10-CM | POA: Diagnosis not present

## 2023-07-04 DIAGNOSIS — K219 Gastro-esophageal reflux disease without esophagitis: Secondary | ICD-10-CM | POA: Diagnosis not present

## 2023-07-04 LAB — BASIC METABOLIC PANEL
Anion gap: 7 (ref 5–15)
BUN: 7 mg/dL (ref 6–20)
CO2: 30 mmol/L (ref 22–32)
Calcium: 8 mg/dL — ABNORMAL LOW (ref 8.9–10.3)
Chloride: 97 mmol/L — ABNORMAL LOW (ref 98–111)
Creatinine, Ser: 0.62 mg/dL (ref 0.44–1.00)
GFR, Estimated: >60 mL/min (ref >60)
Glucose, Bld: 126 mg/dL — ABNORMAL HIGH (ref 70–99)
Potassium: 3.3 mmol/L — ABNORMAL LOW (ref 3.5–5.1)
Sodium: 134 mmol/L — ABNORMAL LOW (ref 135–145)

## 2023-07-04 LAB — GLUCOSE, CAPILLARY
Glucose-Capillary: 122 mg/dL — ABNORMAL HIGH (ref 70–99)
Glucose-Capillary: 134 mg/dL — ABNORMAL HIGH (ref 70–99)
Glucose-Capillary: 141 mg/dL — ABNORMAL HIGH (ref 70–99)

## 2023-07-04 LAB — LACOSAMIDE: Lacosamide: <0.5 ug/mL — ABNORMAL LOW (ref 5.0–10.0)

## 2023-07-04 MED ORDER — LEVETIRACETAM 750 MG PO TABS
1500.0000 mg | ORAL_TABLET | Freq: Two times a day (BID) | ORAL | 3 refills | Status: DC
  Filled 2023-07-04: qty 120, 30d supply, fill #0

## 2023-07-04 MED ORDER — PHENYTOIN 50 MG PO CHEW
100.0000 mg | CHEWABLE_TABLET | Freq: Three times a day (TID) | ORAL | 3 refills | Status: DC
  Filled 2023-07-04: qty 100, 17d supply, fill #0

## 2023-07-04 MED ORDER — LACOSAMIDE 200 MG PO TABS
200.0000 mg | ORAL_TABLET | Freq: Two times a day (BID) | ORAL | 3 refills | Status: DC
  Filled 2023-07-04: qty 60, 30d supply, fill #0

## 2023-07-04 MED ORDER — VALTOCO 15 MG DOSE 7.5 MG/0.1ML NA LQPK
15.0000 mg | Freq: Every day | NASAL | 0 refills | Status: DC | PRN
  Filled 2023-07-04: qty 4, 30d supply, fill #0

## 2023-07-04 MED ORDER — AMOXICILLIN-POT CLAVULANATE 875-125 MG PO TABS
1.0000 | ORAL_TABLET | Freq: Two times a day (BID) | ORAL | 0 refills | Status: AC
  Filled 2023-07-04: qty 4, 2d supply, fill #0

## 2023-07-04 MED ORDER — POTASSIUM CHLORIDE 20 MEQ PO PACK
40.0000 meq | PACK | Freq: Once | ORAL | Status: AC
  Administered 2023-07-04: 40 meq via ORAL
  Filled 2023-07-04: qty 2

## 2023-07-04 NOTE — TOC Transition Note (Signed)
Transition of Care [facility]) - CM/SW Discharge Note   Patient Details  Name: Abigail [name] MRN: [id number] Date of Birth: [birth year]  Transition of [facility] CM/SW Contact:  Gordy Clement, RN Phone Number: 07/04/2023, 11:52 AM   Clinical Narrative:     Patient will DC to home today with Son. HH PT and OT have been ordered and patient is in agreement . Patient requests RNCM to arrange with company that accepts her insurance and has a high star rating. [facility] has accepted referral. AVS updated. Son to transport home             Patient Goals and CMS Choice      Discharge Placement                         Discharge Plan and Services Additional resources added to the After Visit Summary for                                       Social Determinants of Health (SDOH) Interventions SDOH Screenings   Food Insecurity: Food Insecurity Present (07/02/2023)  Housing: Patient Unable To Answer (07/02/2023)  Transportation Needs: Patient Unable To Answer (07/02/2023)  Utilities: Patient Unable To Answer (07/02/2023)  Tobacco Use: High Risk (06/28/2023)     Readmission Risk Interventions     No data to display

## 2023-07-04 NOTE — Progress Notes (Addendum)
Completely awake/alert Pulled her NG tube out yesterday She is requesting that she be discharged home today-she claims that she does not want to go to SNF/CIR-and would rather go home.  Claims she has talked to family-and her son will be picking her up today.  Claims she has all DME at home including bedside commode/cane/walker etc. Will have TOC team evaluate-talk to family and then if she is still wanting to go home-will discharge home at her own request.  Addendum Called mother x 2-left voicemail.

## 2023-07-04 NOTE — Discharge Summary (Addendum)
PATIENT DETAILS Name: Abigail [name] [facility] Age: 57 y.o. Sex: female Date of Birth: [birth year] MRN: [id number]. Admitting Physician: Tereasa Coop, MD NWG:[id number], Abigail Phillips, MD  Admit Date: 06/28/2023 Discharge date: 07/04/2023  Recommendations for Outpatient Follow-up:  Follow up with PCP in 1-2 weeks Please obtain CMP/CBC in one week Please ensure follow up with Neurology  Admitted From:  Home  Disposition: Home health (refused SNF/CIR)   Discharge Condition: good  CODE STATUS:   Code Status: Full Code   Diet recommendation:  Diet Order             Diet - low sodium heart healthy           Diet full liquid Room service appropriate? Yes with Assist; Fluid consistency: Thin  Diet effective now                    Brief Summary: Patient is a 57 y.o.  female with history of RA/GCA-no longer on prednisone, chronic pain on opiates, seizure disorder noncompliant with AEDs-presented with altered mental status-thought to have status epilepticus-transferred from [facility] to [facility] for LTM EEG.  Neurology followed closely-AEDs adjusted-thankfully on 11/12-mental status started to improve.  See below for further details.   Significant events: 11/8>> admit to [facility] 11/10>> transfer to ICU-for close monitoring-at risk for aspiration 11/11>>cortak tube placed-tube feeds started 11/12>> marked improvement in mental status-more awake-alert-following some commands.  Transfer to progressive care.   Significant studies: 11/7>> CXR: Bibasilar atelectasis 11/7>> CT head: No acute intracranial abnormality 11/8-11/9>> LTM EEG: No seizures 11/9>> LTM EEG: Periodic discharges with triphasic morphology 11/10>> LTM EEG: Epileptogenicity arising from right parieto-occipital area 11/11>> LTM EEG: Evidence of epileptogenicity arising from the right parieto-occipital region 11/12>> LTM EEG: Evidence of epileptogenicity from the right parieto-occipital area   Significant microbiology  data: None   Procedures: None   Consults: Neurology  Brief Hospital Course: Status epilepticus  Known history of seizures-noncompliance to AEDs.   Had severe encephalopathy for several days since admit to [facility] improving on 11/12-now rapidly improving-completely awake and alert this morning.  She has decided to leave the hospital-has already called multiple family members-apparently her son is on the way to the hospital to pick her up.  She has refused to go to [facility] SNF She will be continued on Keppra/Vimpat/phenytoin as recommended by neurology She was asked to follow-up with outpatient neurologist Extensive seizure precautions including driving restrictions was discussed with patient in detail.   Acute metabolic encephalopathy Secondary to above Significantly better-back to baseline. Continue  AEDs   Probable aspiration pneumonia Vomited/accumulated secretions when she was encephalopathic-likely has aspiration pneumonia even though CXR negative Treated with Unasyn-will transition to Augmentin and complete a 5-day course of antibiotics.   Hypokalemia Replete prior to discharge   Hypophosphatemia Repleted    Oropharyngeal dysphagia in the setting of encephalopathy NG tube was inserted when she was very encephalopathic-as a encephalopathy improved-she pulled out her NG tube yesterday evening.  She is currently tolerating diet well without any issues.    History of RA/giant cell arteritis No longer on steroids-per last outpatient rheumatology note.   Chronic pain syndrome-on chronic narcotics Fibromyalgia All narcotics/Cymbalta held due to confusion/encephalopathy-however since encephalopathy clearing-as needed oxycodone has been resumed. Resume Cymbalta/gabapentin now that encephalopathy has completely resolved.   Chronic pancreatitis Stable Supportive care for now Follow   Debility/deconditioning PT/OT eval-AIR/SNF recommended-patient refusing and wants to go  home with home health services.  Note-patient is completely awake/alert this morning.  BMI: Estimated body mass index is 28.2 kg/m as calculated from the following:   Height as of this encounter: 5' 1.81" (1.57 m).   Weight as of this encounter: 69.5 kg.   Nutrition Status: Nutrition Problem: Severe Malnutrition Etiology: chronic illness (uncontrolled seizures) Signs/Symptoms: severe muscle depletion, moderate fat depletion Interventions: Tube feeding, Prostat, MVI   Discharge Diagnoses:  Principal Problem:   Status epilepticus (HCC) Active Problems:   Acute metabolic encephalopathy   History of seizure   Rheumatoid arthritis (HCC)   GERD (gastroesophageal reflux disease)   Giant cell arteritis (HCC)   Seizure (HCC)   Metabolic alkalosis   Epilepsy (HCC)   Discharge Instructions:  Activity:  As tolerated with Full fall precautions use walker/cane & assistance as needed  Discharge Instructions     Call MD for:  extreme fatigue   Complete by: As directed    Call MD for:  persistant dizziness or light-headedness   Complete by: As directed    Diet - low sodium heart healthy   Complete by: As directed    Discharge instructions   Complete by: As directed    Follow with Primary MD  [name], Abigail Phillips, MD in 1-2 weeks  Please get a complete blood count and chemistry panel checked by your Primary MD at your next visit, and again as instructed by your Primary MD.  Get Medicines reviewed and adjusted: Please take all your medications with you for your next visit with your Primary MD  Laboratory/radiological data: Please request your Primary MD to go over all hospital tests and procedure/radiological results at the follow up, please ask your Primary MD to get all Hospital records sent to his/her office.  In some cases, they will be blood work, cultures and biopsy results pending at the time of your discharge. Please request that your primary care M.D. follows up on these  results.  Also Note the following: If you experience worsening of your admission symptoms, develop shortness of breath, life threatening emergency, suicidal or homicidal thoughts you must seek medical attention immediately by calling 911 or calling your MD immediately  if symptoms less severe.  You must read complete instructions/literature along with all the possible adverse reactions/side effects for all the Medicines you take and that have been prescribed to you. Take any new Medicines after you have completely understood and accpet all the possible adverse reactions/side effects.   Do not drive when taking Pain medications or sleeping medications (Benzodaizepines)  Do not take more than prescribed Pain, Sleep and Anxiety Medications. It is not advisable to combine anxiety,sleep and pain medications without talking with your primary care practitioner  Special Instructions: If you have smoked or chewed Tobacco  in the last 2 yrs please stop smoking, stop any regular Alcohol  and or any Recreational drug use.  Wear Seat belts while driving.  Please note: You were cared for by a hospitalist during your hospital stay. Once you are discharged, your primary care physician will handle any further medical issues. Please note that NO REFILLS for any discharge medications will be authorized once you are discharged, as it is imperative that you return to your primary care physician (or establish a relationship with a primary care physician if you do not have one) for your post hospital discharge needs so that they can reassess your need for medications and monitor your lab values.     Seizure precautions: Per [facility] statutes, patients with seizures are not allowed to drive until they  have been seizure-free for six months and cleared by a physician    Use caution when using heavy equipment or power tools. Avoid working on ladders or at heights. Take showers instead of baths. Ensure the water  temperature is not too high on the home water heater. Do not go swimming alone. Do not lock yourself in a room alone (i.e. bathroom). When caring for infants or small children, sit down when holding, feeding, or changing them to minimize risk of injury to the child in the event you have a seizure. Maintain good sleep hygiene. Avoid alcohol.    If patient has another seizure, call 911 and bring them back to the ED if: A.  The seizure lasts longer than 5 minutes.      B.  The patient doesn't wake shortly after the seizure or has new problems such as difficulty seeing, speaking or moving following the seizure C.  The patient was injured during the seizure D.  The patient has a temperature over 102 F (39C) E.  The patient vomited during the seizure and now is having trouble breathing    During the Seizure   - First, ensure adequate ventilation and place patients on the floor on their left side  Loosen clothing around the neck and ensure the airway is patent. If the patient is clenching the teeth, do not force the mouth open with any object as this can cause severe damage - Remove all items from the surrounding that can be hazardous. The patient may be oblivious to what's happening and may not even know what he or she is doing. If the patient is confused and wandering, either gently guide him/her away and block access to outside areas - Reassure the individual and be comforting - Call 911. In most cases, the seizure ends before EMS arrives. However, there are cases when seizures may last over 3 to 5 minutes. Or the individual may have developed breathing difficulties or severe injuries. If a pregnant patient or a person with diabetes develops a seizure, it is prudent to call an ambulance. - Finally, if the patient does not regain full consciousness, then call EMS. Most patients will remain confused for about 45 to 90 minutes after a seizure, so you must use judgment in calling for help. - Avoid  restraints but make sure the patient is in a bed with padded side rails - Place the individual in a lateral position with the neck slightly flexed; this will help the saliva drain from the mouth and prevent the tongue from falling backward - Remove all nearby furniture and other hazards from the area - Provide verbal assurance as the individual is regaining consciousness - Provide the patient with privacy if possible - Call for help and start treatment as ordered by the caregiver    After the Seizure (Postictal Stage)   After a seizure, most patients experience confusion, fatigue, muscle pain and/or a headache. Thus, one should permit the individual to sleep. For the next few days, reassurance is essential. Being calm and helping reorient the person is also of importance.   Most seizures are painless and end spontaneously. Seizures are not harmful to others but can lead to complications such as stress on the lungs, brain and the heart. Individuals with prior lung problems may develop labored breathing and respiratory distress.    Increase activity slowly   Complete by: As directed       Allergies as of 07/04/2023  Reactions   Nsaids Hives   Tapentadol Swelling, Rash, Other (See Comments)   Nucynta- Made her deathly sick   Cephalexin Other (See Comments)   Reaction not cited   Codeine Other (See Comments)   Reaction not cited   Darvocet [propoxyphene N-acetaminophen] Other (See Comments)   Reaction not cited   Latex Other (See Comments)   Reaction not cited   Silicone Other (See Comments)   Reaction not cited   Sulfa Antibiotics Hives   Tape Other (See Comments)   Reaction not cited   Meloxicam Rash        Medication List     STOP taking these medications    phenytoin 100 MG ER capsule Commonly known as: DILANTIN       TAKE these medications    albuterol 108 (90 Base) MCG/ACT inhaler Commonly known as: VENTOLIN HFA Inhale 2 puffs into the lungs every 6  (six) hours as needed for wheezing or shortness of breath.   amoxicillin-clavulanate 875-125 MG tablet Commonly known as: AUGMENTIN Take 1 tablet by mouth 2 (two) times daily for 2 days.   BC Fast Pain Relief 845-65 MG Pack Generic drug: Aspirin-Caffeine Take 1 packet by mouth 2 (two) times daily as needed (for headaches or pain).   DULoxetine 30 MG capsule Commonly known as: CYMBALTA Take 1 capsule (30 mg total) by mouth daily. What changed:  how much to take when to take this   FLEXERIL PO Take 10 tablets by mouth at bedtime.   folic acid 1 MG tablet Commonly known as: FOLVITE Take 1 mg by mouth daily.   gabapentin 300 MG capsule Commonly known as: NEURONTIN Take 300 mg by mouth 4 (four) times daily. Take 2 capsules by mouth in the morning, 1 capsule at noon, 2 capsules in the evening   hydrOXYzine 25 MG tablet Commonly known as: ATARAX Take 1 tablet (25 mg total) by mouth 3 (three) times daily as needed for itching or anxiety.   lacosamide 200 MG Tabs tablet Commonly known as: VIMPAT Take 1 tablet (200 mg total) by mouth 2 (two) times daily.   levETIRAcetam 750 MG tablet Commonly known as: KEPPRA Take 2 tablets (1,500 mg total) by mouth 2 (two) times daily. What changed:  medication strength how much to take   Magnesium 250 MG Tabs Take 250 mg by mouth at bedtime.   megestrol 40 MG/ML suspension Commonly known as: MEGACE Take 200 mg by mouth daily as needed (for appetite).   Melatonin 10 MG Tabs Take 10 mg by mouth at bedtime.   modafinil 200 MG tablet Commonly known as: PROVIGIL Take 200 mg by mouth daily.   multivitamin with minerals Tabs tablet Take 1 tablet by mouth daily.   oxyCODONE 15 MG immediate release tablet Commonly known as: ROXICODONE Take 15 mg by mouth 5 (five) times daily as needed for pain.   pantoprazole 40 MG tablet Commonly known as: PROTONIX Take 40 mg by mouth 2 (two) times daily before a meal.   phenytoin 50 MG  tablet Commonly known as: DILANTIN Chew 2 tablets (100 mg total) by mouth 3 (three) times daily.   potassium chloride 10 MEQ tablet Commonly known as: KLOR-CON Take 10 mEq by mouth daily.   thiamine 100 MG tablet Commonly known as: VITAMIN B1 Take 1 tablet (100 mg total) by mouth daily.   Valtoco 15 MG Dose 7.5 MG/0.1ML Lqpk Generic drug: diazePAM (15 MG Dose) Place 15 mg into the nose daily as needed (Intranasal  Valtoco 15 mg for seizure lasting more than 2 minutes).   vitamin B-12 500 MCG tablet Commonly known as: CYANOCOBALAMIN Take 500 mcg by mouth daily.   Vitamin D3 50 MCG (2000 UT) Tabs Take 50 mcg by mouth daily.        Follow-up Information     Lonell Face, MD. Schedule an appointment as soon as possible for a visit in 1 week(s).   Specialty: Neurology Contact information: [address] [facility] [place] [address] [phone]         [name], Abigail Phillips, MD. Schedule an appointment as soon as possible for a visit in 1 week(s).   Specialty: Family Medicine Contact information: [address] [place] [address] [phone]                Allergies  Allergen Reactions   Nsaids Hives   Tapentadol Swelling, Rash and Other (See Comments)    Nucynta- Made her deathly sick   Cephalexin Other (See Comments)    Reaction not cited   Codeine Other (See Comments)    Reaction not cited   Darvocet [Propoxyphene N-Acetaminophen] Other (See Comments)    Reaction not cited   Latex Other (See Comments)    Reaction not cited   Silicone Other (See Comments)    Reaction not cited   Sulfa Antibiotics Hives   Tape Other (See Comments)    Reaction not cited   Meloxicam Rash     Other Procedures/Studies: [place] EKG SITE RITE  Result Date: 07/02/2023 If Site Rite image not attached, placement could not be confirmed due to current cardiac rhythm.  DG Abd Portable 1V  Result Date: 07/01/2023 CLINICAL DATA:  57 year old  female feeding tube placement. EXAM: PORTABLE ABDOMEN - 1 VIEW COMPARISON:  Portable abdomen this morning, CT Abdomen and Pelvis 12/14/2022. FINDINGS: Portable AP view at 1007 hours. Enteric feeding tube placed through the stomach and terminating in the right upper quadrant at the level of the proximal duodenum when correlating to previous CT Abdomen and Pelvis coronal view. Increased gastric air, but visible bowel-gas pattern remains non obstructed. Negative lung bases. IMPRESSION: Enteric feeding tube placed with tip at the proximal duodenum. Electronically Signed   By: Odessa Fleming M.D.   On: 07/01/2023 10:16   DG Abd Portable 2V  Result Date: 07/01/2023 CLINICAL DATA:  57 year old female shortness of breath and vomiting. EXAM: PORTABLE ABDOMEN - 2 VIEW COMPARISON:  CT Abdomen and Pelvis 12/14/2022 and earlier. FINDINGS: Portable AP supine view at 0535 hours, and left-side-down lateral decubitus view at 0540 hours. Nonobstructed bowel gas pattern, no pneumoperitoneum. Chronic lumbar spine degeneration and lower lumbar interbody fusion. Right total hip arthroplasty partially visible. No acute osseous abnormality identified. Calcified aortic atherosclerosis. IMPRESSION: Nonobstructed bowel gas pattern with no pneumoperitoneum. Electronically Signed   By: Odessa Fleming M.D.   On: 07/01/2023 10:15   DG Chest Port 1 View  Result Date: 07/01/2023 CLINICAL DATA:  57 year old female shortness of breath and vomiting. EXAM: PORTABLE CHEST 1 VIEW COMPARISON:  Portable chest 06/29/2023. FINDINGS: Portable AP semi upright view at 0531 hours. Ongoing low lung volumes. Stable cardiac size and mediastinal contours. Resolved platelike right lung base opacity. But similar left lung base platelike opacity now. No superimposed pneumothorax, pulmonary edema or pleural effusion. Visualized tracheal air column is within normal limits. Cervical ACDF. Negative visible bowel gas. IMPRESSION: Ongoing Low lung volumes with mild lung base  atelectasis. Electronically Signed   By: Odessa Fleming  M.D.   On: 07/01/2023 10:14   DG Chest Port 1 View  Result Date: 06/29/2023 CLINICAL DATA:  Shortness of breath. EXAM: PORTABLE CHEST 1 VIEW COMPARISON:  06/27/2023 FINDINGS: Low volume film. Interstitial markings are diffusely coarsened with chronic features. Streaky opacity at the bases compatible with atelectasis or scarring. The cardiopericardial silhouette is within normal limits for size. IMPRESSION: Low volume film with bibasilar atelectasis or scarring. Electronically Signed   By: Kennith Center M.D.   On: 06/29/2023 10:21   Overnight EEG with video  Result Date: 06/28/2023 [name], MD     06/29/2023  8:16 AM Patient Name: Abigail [name] [facility] MRN: [id number] Epilepsy Attending: [name] Referring Physician/Provider: Erick Blinks, MD Duration: 06/28/2023 0430 to 06/29/2023 0430 Patient history:  57 y.o. female with hx of hx of epilepsy, RA, giant cell arteritis, prescription abuse p/w AMS throughout the day. EEG to evaluate for seizure Level of alertness: Awake, asleep AEDs during EEG study: LEV, LCM, Ativan Technical aspects: This EEG study was done with scalp electrodes positioned according to the 10-20 International system of electrode placement. Electrical activity was reviewed with band pass filter of 1-70Hz , sensitivity of 7 uV/mm, display speed of 50mm/sec with a 60Hz  notched filter applied as appropriate. EEG data were recorded continuously and digitally stored.  Video monitoring was available and reviewed as appropriate. Description: No posterior dominant rhythm was seen. Sleep was characterized by vertex waves, sleep spindles (12 to 14 Hz), maximal frontocentral region. EEG showed continuous generalized polymorphic sharply contoured 3 to 6 Hz theta-delta slowing. Generalized periodic discharges with triphasic morphology at 1-1.5 hz were also noted.  Hyperventilation and photic stimulation were not performed.    ABNORMALITY - Periodic discharges with triphasic morphology, generalized - Continuous slow, generalized IMPRESSION: This study is suggestive of moderate to severe diffuse encephalopathy likely secondary to toxic-metabolic causes. No seizures or definite epileptiform discharges were seen throughout the recording. [name]   CT HEAD WO CONTRAST ( )  Result Date: 06/27/2023 CLINICAL DATA:  Mental status change, unknown cause EXAM: CT HEAD WITHOUT CONTRAST TECHNIQUE: Contiguous axial images were obtained from the base of the skull through the vertex without intravenous contrast. RADIATION DOSE REDUCTION: This exam was performed according to the departmental dose-optimization program which includes automated exposure control, adjustment of the mA and/or kV according to patient size and/or use of iterative reconstruction technique. COMPARISON:  Brain MRI 12/18/2022 FINDINGS: Brain: No intracranial hemorrhage, mass effect, or midline shift. No hydrocephalus. The basilar cisterns are patent. Minimal chronic white matter changes in the anterior limb of the internal capsular stable. No evidence of territorial infarct or acute ischemia. No extra-axial or intracranial fluid collection. Vascular: No hyperdense vessel or unexpected calcification. Skull: No fracture or focal lesion. Sinuses/Orbits: Chronic left maxillary mucosal thickening, only partially included. No acute findings. Other: None. IMPRESSION: No acute intracranial abnormality. Electronically Signed   By: Narda Rutherford M.D.   On: 06/27/2023 23:33   DG Chest Portable 1 View  Result Date: 06/27/2023 CLINICAL DATA:  Altered mental status. EXAM: PORTABLE CHEST 1 VIEW COMPARISON:  12/24/2022 FINDINGS: Low lung volumes persist. Normal heart size with stable mediastinal contours. Atelectasis at both lung basis without confluent airspace disease. No pleural effusion or pneumothorax. No acute osseous findings. IMPRESSION: Low lung volumes with bibasilar  atelectasis. Electronically Signed   By: Narda Rutherford M.D.   On: 06/27/2023 23:03     TODAY-DAY OF DISCHARGE:  Subjective:   Abigail [name] today has no headache,no  chest abdominal pain,no new weakness tingling or numbness, feels much better wants to go home today.   Objective:   Blood pressure 125/71, pulse 100, temperature (!) 97.2 F (36.2 C), temperature source Oral, resp. rate 13, height 5' 1.81" (1.57 m), weight 72.2 kg, SpO2 96%.  Intake/Output Summary (Last 24 hours) at 07/04/2023 1100 Last data filed at 07/04/2023 0600 Gross per 24 hour  Intake 390 ml  Output 500 ml  Net -110 ml   Filed Weights   07/02/23 0500 07/03/23 0158 07/04/23 0517  Weight: 69.5 kg 69.5 kg 72.2 kg    Exam: Awake Alert, Oriented *3, No new F.N deficits, Normal affect McConnell.AT,PERRAL Supple Neck,No JVD, No cervical lymphadenopathy appriciated.  Symmetrical Chest wall movement, Good air movement bilaterally, CTAB RRR,No Gallops,Rubs or new Murmurs, No Parasternal Heave +ve B.Sounds, Abd Soft, Non tender, No organomegaly appriciated, No rebound -guarding or rigidity. No Cyanosis, Clubbing or edema, No new Rash or bruise   PERTINENT RADIOLOGIC STUDIES: [place] EKG SITE RITE  Result Date: 07/02/2023 If Site Rite image not attached, placement could not be confirmed due to current cardiac rhythm.    PERTINENT LAB RESULTS: CBC: Recent Labs    07/02/23 0551 07/03/23 0926  WBC 12.2* 9.3  HGB 13.5 11.7*  HCT 41.5 37.0  PLT 333 247   CMET CMP     Component Value Date/Time   NA 134 (L) 07/04/2023 0409   NA 141 05/24/2014 1903   K 3.3 (L) 07/04/2023 0409   K 3.8 05/24/2014 1903   CL 97 (L) 07/04/2023 0409   CL 106 05/24/2014 1903   CO2 30 07/04/2023 0409   CO2 31 05/24/2014 1903   GLUCOSE 126 (H) 07/04/2023 0409   GLUCOSE 100 (H) 05/24/2014 1903   BUN 7 07/04/2023 0409   BUN 7 05/24/2014 1903   CREATININE 0.62 07/04/2023 0409   CREATININE 0.80 05/24/2014 1903   CALCIUM 8.0 (L)  07/04/2023 0409   CALCIUM 8.9 05/24/2014 1903   PROT 6.6 07/02/2023 0551   PROT 7.7 05/24/2014 1903   ALBUMIN 2.3 (L) 07/02/2023 0551   ALBUMIN 3.4 05/24/2014 1903   AST 25 07/02/2023 0551   AST 24 05/24/2014 1903   ALT 16 07/02/2023 0551   ALT 28 05/24/2014 1903   ALKPHOS 96 07/02/2023 0551   ALKPHOS 98 05/24/2014 1903   BILITOT 0.5 07/02/2023 0551   BILITOT 0.2 05/24/2014 1903   GFRNONAA >60 07/04/2023 0409   GFRNONAA >60 05/24/2014 1903   GFRNONAA >60 10/13/2013 1143    GFR Estimated Creatinine Clearance: 71 mL/min (by C-G formula based on SCr of 0.62 mg/dL). No results for input(s): "LIPASE", "AMYLASE" in the last 72 hours. No results for input(s): "CKTOTAL", "CKMB", "CKMBINDEX", "TROPONINI" in the last 72 hours. Invalid input(s): "POCBNP" No results for input(s): "DDIMER" in the last 72 hours. No results for input(s): "HGBA1C" in the last 72 hours. No results for input(s): "CHOL", "HDL", "LDLCALC", "TRIG", "CHOLHDL", "LDLDIRECT" in the last 72 hours. No results for input(s): "TSH", "T4TOTAL", "T3FREE", "THYROIDAB" in the last 72 hours.  Invalid input(s): "FREET3" No results for input(s): "VITAMINB12", "FOLATE", "FERRITIN", "TIBC", "IRON", "RETICCTPCT" in the last 72 hours. Coags: No results for input(s): "INR" in the last 72 hours.  Invalid input(s): "PT" Microbiology: Recent Results (from the past 240 hour(s))  MRSA Next Gen by PCR, Nasal     Status: None   Collection Time: 06/30/23  4:47 PM   Specimen: Nasal Mucosa; Nasal Swab  Result Value Ref Range Status  MRSA by PCR Next Gen NOT DETECTED NOT DETECTED Final    Comment: (NOTE) The GeneXpert MRSA Assay (FDA approved for NASAL specimens only), is one component of a comprehensive MRSA colonization surveillance program. It is not intended to diagnose MRSA infection nor to guide or monitor treatment for MRSA infections. Test performance is not FDA approved in patients less than 1 years old. Performed at [facility], [address], [place], [place] [address]     FURTHER DISCHARGE INSTRUCTIONS:  Get Medicines reviewed and adjusted: Please take all your medications with you for your next visit with your Primary MD  Laboratory/radiological data: Please request your Primary MD to go over all hospital tests and procedure/radiological results at the follow up, please ask your Primary MD to get all Hospital records sent to his/her office.  In some cases, they will be blood work, cultures and biopsy results pending at the time of your discharge. Please request that your primary care M.D. goes through all the records of your hospital data and follows up on these results.  Also Note the following: If you experience worsening of your admission symptoms, develop shortness of breath, life threatening emergency, suicidal or homicidal thoughts you must seek medical attention immediately by calling 911 or calling your MD immediately  if symptoms less severe.  You must read complete instructions/literature along with all the possible adverse reactions/side effects for all the Medicines you take and that have been prescribed to you. Take any new Medicines after you have completely understood and accpet all the possible adverse reactions/side effects.   Do not drive when taking Pain medications or sleeping medications (Benzodaizepines)  Do not take more than prescribed Pain, Sleep and Anxiety Medications. It is not advisable to combine anxiety,sleep and pain medications without talking with your primary care practitioner  Special Instructions: If you have smoked or chewed Tobacco  in the last 2 yrs please stop smoking, stop any regular Alcohol  and or any Recreational drug use.  Wear Seat belts while driving.  Please note: You were cared for by a hospitalist during your hospital stay. Once you are discharged, your primary care physician will handle any further medical issues. Please note that NO REFILLS  for any discharge medications will be authorized once you are discharged, as it is imperative that you return to your primary care physician (or establish a relationship with a primary care physician if you do not have one) for your post hospital discharge needs so that they can reassess your need for medications and monitor your lab values.  Total Time spent coordinating discharge including counseling, education and face to face time equals greater than 30 minutes.  Signed: Eleanna Theilen 07/04/2023 11:00 AM

## 2023-07-04 NOTE — Progress Notes (Signed)
Occupational Therapy Treatment Patient Details Name: Abigail [name] [facility] MRN: [id number] DOB: 09-07-1965 Today's Date: 07/04/2023   History of present illness 57 y.o. female presents to [facility] hospital on 06/27/2023 with AMS, concern for possible seizures. 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 PMH includes epilepsy, RA, giant cell arteritis, chronic pain syndrome.   OT comments  Pt making steady progress towards OT goals this session. Pt continues to present with RUE weakness, decreased activity tolerance, impaired balance, and visual deficits . Session focus on BADL reeducation and functional mobility. Pt currently requires CGA for ADL transfers with MAX cues for RW mgmt d/t vision. Pt required MAX A for ADL participation d/t decreased activity tolerance and decreased ability to maintain grasp on wash cloth with R hand. Pt insistent on going home however highly recommended pt get more rehab prior to returning home. Educated pt on below topics if pt continues to decline post acute rehab:   -always using gait belt for functional mobility with assistance from family  -24/7 supervision for all ADLS and functional mobility  -recommendation on use of RW for functional ambulation - general assist needed for ADLS and functional mobility ( I.e MAX A) -general education provided on RUE weakness and impaired coordination -decreasing clutter/fall hazards in the home - discussed DME needs - compensatory methods for visual deficits - recommendation of family helping with IADLS  Pt would continue to benefit from skilled occupational therapy while admitted and after d/c to address the below listed limitations in order to improve overall functional mobility and facilitate independence with BADL participation. DC plan remains appropriate, will follow acutely per POC.          If plan is discharge home, recommend the following:  A lot of help with  walking and/or transfers;A lot of help with bathing/dressing/bathroom;Assistance with cooking/housework;Assistance with feeding;Direct supervision/assist for medications management;Assist for transportation;Help with stairs or ramp for entrance;Supervision due to cognitive status   Equipment Recommendations  Other (comment) (defer)    Recommendations for Other Services      Precautions / Restrictions Precautions Precautions: Fall Precaution Comments: flexiseal, impaired vision Restrictions Weight Bearing Restrictions: No       Mobility Bed Mobility               General bed mobility comments: OOB upon arrival    Transfers Overall transfer level: Needs assistance Equipment used: Rolling walker (2 wheels) Transfers: Sit to/from Stand Sit to Stand: Contact guard assist           General transfer comment: pt stood from recliner wtih CGA and chair with no arm rests. pt required CGA for ambulatory transfer to/from sink but needed MAX multimodal cues for RW mgmt and safety awareness d/t visual deficits     Balance Overall balance assessment: Needs assistance Sitting-balance support: Single extremity supported, Feet supported Sitting balance-Leahy Scale: Fair     Standing balance support: Bilateral upper extremity supported, During functional activity, Reliant on assistive device for balance Standing balance-Leahy Scale: Poor Standing balance comment: pt able to maintain static standing balance with close supervision but required min A for dyanmic standing balance due to visual impairments                           ADL either performed or assessed with clinical judgement   ADL Overall ADL's : Needs assistance/impaired         Upper Body Bathing: Maximal assistance;Sitting Upper Body  Bathing Details (indicate cue type and reason): pt unable to maintain grasp on wash cloth and presents with decreased activity tolerance needing  MAX A for cleanliness              Toilet Transfer: Contact guard assist;Rolling walker (2 wheels);Ambulation;Cueing for safety Toilet Transfer Details (indicate cue type and reason): simulated via functional mobility but required MAX cues for safety d/t visual impairments         Functional mobility during ADLs: Contact guard assist;Rolling walker (2 wheels);Cueing for safety General ADL Comments: ADL participation impacted by decreased activity tolerance, RUE incoordination and visual impairments    Extremity/Trunk Assessment Upper Extremity Assessment Upper Extremity Assessment: RUE deficits/detail;LUE deficits/detail RUE Deficits / Details: Pt has little to no FM skills in r hand, able to complete gross grip with fair strength RUE Sensation: decreased light touch;decreased proprioception RUE Coordination: decreased fine motor LUE Deficits / Details: decreased FM skills, not able to maintain grasp on wash cloth LUE Sensation: decreased light touch;decreased proprioception LUE Coordination: decreased fine motor   Lower Extremity Assessment Lower Extremity Assessment: Defer to PT evaluation        Vision Baseline Vision/History: 1 Wears glasses (readers, reports she knows she needs new glasses to wear all the time) Additional Comments: pt reports diplopia, seeing "shadows", blurry vision. presents with what seems to be L sided inattention as pt running into obstacles on L side   Perception Perception Perception: Not tested   Praxis Praxis Praxis: Not tested    Cognition Arousal: Alert Behavior During Therapy: Flat affect Overall Cognitive Status: No family/caregiver present to determine baseline cognitive functioning Area of Impairment: Safety/judgement, Awareness, Following commands, Memory, Attention, Problem solving                   Current Attention Level: Focused Memory: Decreased recall of precautions, Decreased short-term memory Following Commands: Follows one step commands with  increased time, Follows multi-step commands inconsistently Safety/Judgement: Decreased awareness of safety, Decreased awareness of deficits Awareness: Intellectual Problem Solving: Slow processing, Decreased initiation, Requires verbal cues General Comments: pt slow to process with decreased awareness into deficits        Exercises      Shoulder Instructions       General Comments VSS on RA, HR max 115 bpm    Pertinent Vitals/ Pain       Pain Assessment Pain Assessment: Faces Faces Pain Scale: Hurts little more Pain Location: back/neck Pain Descriptors / Indicators: Aching, Constant, Grimacing Pain Intervention(s): Monitored during session, Repositioned, Patient requesting pain meds-RN notified  Home Living                                          Prior Functioning/Environment              Frequency  Min 1X/week        Progress Toward Goals  OT Goals(current goals can now be found in the care plan section)  Progress towards OT goals: Progressing toward goals  Acute Rehab OT Goals Patient Stated Goal: to go home OT Goal Formulation: With patient Time For Goal Achievement: 07/17/23 Potential to Achieve Goals: Good  Plan      Co-evaluation                 AM-PAC OT "6 Clicks" Daily Activity     Outcome Measure   Help from another  person eating meals?: A Little Help from another person taking care of personal grooming?: A Lot Help from another person toileting, which includes using toliet, bedpan, or urinal?: A Lot Help from another person bathing (including washing, rinsing, drying)?: A Lot Help from another person to put on and taking off regular upper body clothing?: A Lot Help from another person to put on and taking off regular lower body clothing?: A Lot 6 Click Score: 13    End of Session Equipment Utilized During Treatment: Gait belt;Rolling walker (2 wheels)  OT Visit Diagnosis: Unsteadiness on feet (R26.81);Other  abnormalities of gait and mobility (R26.89);Muscle weakness (generalized) (M62.81);Repeated falls (R29.6);Other symptoms and signs involving the nervous system (R29.898);Other symptoms and signs involving cognitive function;Pain Pain - part of body:  (neck/back)   Activity Tolerance Patient tolerated treatment well   Patient Left in bed;with call bell/phone within reach;with bed alarm set;with nursing/sitter in room   Nurse Communication Mobility status;Other (comment) (nurse present)        Time: 8469-6295 OT Time Calculation (min): 30 min  Charges: OT General Charges $OT Visit: 1 Visit OT Treatments $Self Care/Home Management : 23-37 mins  Lenor Derrick., COTA/L Acute Rehabilitation Services [phone]   Barron Schmid 07/04/2023, 12:10 PM

## 2023-07-04 NOTE — Progress Notes (Signed)
Physical Therapy Treatment Patient Details Name: Abigail [name] MRN: [id number] DOB: [birth year] Today's Date: 07/04/2023   History of Present Illness 57 y.o. female presents to [facility] hospital on 06/27/2023 with AMS, concern for possible seizures. 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 PMH includes epilepsy, RA, giant cell arteritis, chronic pain syndrome.    PT Comments  Received pt sitting in recliner, begging to "go home" - RN present to assess skin integrity. Pt performed all transfers with RW and CGA and ambulated 43ft with RW and min A. Pt bumping into objects on L/R side of room and required assist from therapist to reposition RW and maneuver around obstacles. Pt reports visual impairments at baseline, but states that she is able to mobilize better at home due to familiarity of environment. Pt reports her son will be available to provide 24/7 assist and states she does not have any STE home. Recommend HHPT at this time due to current deficits and recommended pt use RW with all mobility at home.    If plan is discharge home, recommend the following: A lot of help with bathing/dressing/bathroom;Assistance with cooking/housework;Assistance with feeding;Direct supervision/assist for medications management;Direct supervision/assist for financial management;Assist for transportation;Help with stairs or ramp for entrance;Supervision due to cognitive status;A little help with walking and/or transfers   Can travel by private vehicle        Equipment Recommendations  Other (comment) (pt reports having RW at home)    Recommendations for Other Services       Precautions / Restrictions Precautions Precautions: Fall Precaution Comments: flexiseal, impaired vision Restrictions Weight Bearing Restrictions: No     Mobility  Bed Mobility               General bed mobility comments: sitting in recliner upon entry and exit Patient  Response: Anxious, Restless  Transfers Overall transfer level: Needs assistance Equipment used: Rolling walker (2 wheels) Transfers: Sit to/from Stand Sit to Stand: Contact guard assist           General transfer comment: stood from recliner with RW and CGA    Ambulation/Gait Ambulation/Gait assistance: Min assist Gait Distance (Feet): 30 Feet Assistive device: Rolling walker (2 wheels) Gait Pattern/deviations: Decreased step length - right, Decreased step length - left, Decreased stride length, Narrow base of support Gait velocity: decreased Gait velocity interpretation: <1.31 ft/sec, indicative of household ambulator   General Gait Details: pt running into things on L/R side of room and required assist from therapist to reposition RW and maneuver it around obstacles. Pt with poor insight into impairments   Stairs             Wheelchair Mobility     Tilt Bed Tilt Bed Patient Response: Anxious, Restless  Modified Rankin (Stroke Patients Only)       Balance Overall balance assessment: Needs assistance         Standing balance support: Bilateral upper extremity supported, During functional activity, Reliant on assistive device for balance (RW) Standing balance-Leahy Scale: Poor Standing balance comment: pt able to maintain static standing balance with close supervision but required min A for dyanmic standing balance due to visual impairments                            Cognition Arousal: Alert Behavior During Therapy: Anxious, Restless Overall Cognitive Status: No family/caregiver present to determine baseline cognitive functioning Area of Impairment: Safety/judgement, Awareness  Following Commands: Follows one step commands with increased time, Follows multi-step commands inconsistently Safety/Judgement: Decreased awareness of safety, Decreased awareness of deficits   Problem Solving: Slow processing, Decreased  initiation, Requires verbal cues General Comments: Pt fixated on returning home with son and demo decreased insight into deficits        Exercises      General Comments        Pertinent Vitals/Pain Pain Assessment Pain Score: 7  Pain Location: back/neck Pain Descriptors / Indicators: Aching, Constant, Grimacing Pain Intervention(s): Monitored during session, Limited activity within patient's tolerance, Premedicated before session, Repositioned    Home Living                          Prior Function            PT Goals (current goals can now be found in the care plan section) Acute Rehab PT Goals Patient Stated Goal: to go home PT Goal Formulation: With patient Time For Goal Achievement: 07/17/23 Potential to Achieve Goals: Fair    Frequency    Min 1X/week      PT Plan      Co-evaluation              AM-PAC PT "6 Clicks" Mobility   Outcome Measure  Help needed turning from your back to your side while in a flat bed without using bedrails?: A Lot Help needed moving from lying on your back to sitting on the side of a flat bed without using bedrails?: A Lot Help needed moving to and from a bed to a chair (including a wheelchair)?: A Little Help needed standing up from a chair using your arms (e.g., wheelchair or bedside chair)?: A Little Help needed to walk in hospital room?: A Little Help needed climbing 3-5 steps with a railing? : A Little 6 Click Score: 16    End of Session Equipment Utilized During Treatment: Gait belt Activity Tolerance: Patient tolerated treatment well Patient left: in chair;with call bell/phone within reach;with chair alarm set Nurse Communication: Mobility status PT Visit Diagnosis: Other abnormalities of gait and mobility (R26.89);Muscle weakness (generalized) (M62.81);Unsteadiness on feet (R26.81);Pain Pain - Right/Left:  (back/neck)     Time: 7829-5621 PT Time Calculation (min) (ACUTE ONLY): 15 min  Charges:     $Gait Training: 8-22 mins PT General Charges $$ ACUTE PT VISIT: 1 Visit                     Blima Rich PT, DPT Abigail [name] 07/04/2023, 12:05 PM

## 2023-07-12 ENCOUNTER — Emergency Department: Payer: Medicare HMO

## 2023-07-12 ENCOUNTER — Encounter (HOSPITAL_COMMUNITY): Payer: Self-pay

## 2023-07-12 ENCOUNTER — Emergency Department
Admission: EM | Admit: 2023-07-12 | Discharge: 2023-07-15 | Disposition: A | Discharge status: Home / Self Care | Payer: Medicare HMO | Attending: Emergency Medicine | Admitting: Emergency Medicine

## 2023-07-12 DIAGNOSIS — R1010 Upper abdominal pain, unspecified: Secondary | ICD-10-CM | POA: Diagnosis not present

## 2023-07-12 DIAGNOSIS — G9341 Metabolic encephalopathy: Secondary | ICD-10-CM | POA: Diagnosis present

## 2023-07-12 DIAGNOSIS — Z79899 Other long term (current) drug therapy: Secondary | ICD-10-CM | POA: Insufficient documentation

## 2023-07-12 DIAGNOSIS — K863 Pseudocyst of pancreas: Secondary | ICD-10-CM

## 2023-07-12 DIAGNOSIS — E039 Hypothyroidism, unspecified: Secondary | ICD-10-CM | POA: Insufficient documentation

## 2023-07-12 DIAGNOSIS — R400 Somnolence: Secondary | ICD-10-CM | POA: Insufficient documentation

## 2023-07-12 DIAGNOSIS — J9601 Acute respiratory failure with hypoxia: Secondary | ICD-10-CM | POA: Diagnosis present

## 2023-07-12 DIAGNOSIS — R109 Unspecified abdominal pain: Secondary | ICD-10-CM | POA: Diagnosis present

## 2023-07-12 DIAGNOSIS — R569 Unspecified convulsions: Secondary | ICD-10-CM | POA: Diagnosis not present

## 2023-07-12 DIAGNOSIS — K859 Acute pancreatitis without necrosis or infection, unspecified: Secondary | ICD-10-CM | POA: Diagnosis not present

## 2023-07-12 DIAGNOSIS — F17210 Nicotine dependence, cigarettes, uncomplicated: Secondary | ICD-10-CM | POA: Insufficient documentation

## 2023-07-12 DIAGNOSIS — F17200 Nicotine dependence, unspecified, uncomplicated: Secondary | ICD-10-CM | POA: Diagnosis present

## 2023-07-12 DIAGNOSIS — Z79891 Long term (current) use of opiate analgesic: Secondary | ICD-10-CM | POA: Diagnosis not present

## 2023-07-12 DIAGNOSIS — K219 Gastro-esophageal reflux disease without esophagitis: Secondary | ICD-10-CM | POA: Diagnosis present

## 2023-07-12 DIAGNOSIS — K861 Other chronic pancreatitis: Secondary | ICD-10-CM | POA: Insufficient documentation

## 2023-07-12 DIAGNOSIS — Z85828 Personal history of other malignant neoplasm of skin: Secondary | ICD-10-CM | POA: Diagnosis not present

## 2023-07-12 DIAGNOSIS — D72829 Elevated white blood cell count, unspecified: Secondary | ICD-10-CM | POA: Insufficient documentation

## 2023-07-12 DIAGNOSIS — I739 Peripheral vascular disease, unspecified: Secondary | ICD-10-CM | POA: Diagnosis present

## 2023-07-12 DIAGNOSIS — R1084 Generalized abdominal pain: Secondary | ICD-10-CM | POA: Diagnosis not present

## 2023-07-12 DIAGNOSIS — R4182 Altered mental status, unspecified: Secondary | ICD-10-CM | POA: Diagnosis present

## 2023-07-12 DIAGNOSIS — I700 Atherosclerosis of aorta: Secondary | ICD-10-CM | POA: Diagnosis not present

## 2023-07-12 DIAGNOSIS — G40909 Epilepsy, unspecified, not intractable, without status epilepticus: Secondary | ICD-10-CM | POA: Diagnosis not present

## 2023-07-12 DIAGNOSIS — J189 Pneumonia, unspecified organism: Secondary | ICD-10-CM

## 2023-07-12 DIAGNOSIS — G894 Chronic pain syndrome: Secondary | ICD-10-CM | POA: Diagnosis not present

## 2023-07-12 DIAGNOSIS — R0602 Shortness of breath: Secondary | ICD-10-CM | POA: Insufficient documentation

## 2023-07-12 DIAGNOSIS — K858 Other acute pancreatitis without necrosis or infection: Secondary | ICD-10-CM | POA: Diagnosis not present

## 2023-07-12 DIAGNOSIS — D735 Infarction of spleen: Secondary | ICD-10-CM | POA: Diagnosis not present

## 2023-07-12 DIAGNOSIS — M797 Fibromyalgia: Secondary | ICD-10-CM | POA: Insufficient documentation

## 2023-07-12 DIAGNOSIS — M6281 Muscle weakness (generalized): Secondary | ICD-10-CM | POA: Diagnosis present

## 2023-07-12 DIAGNOSIS — M069 Rheumatoid arthritis, unspecified: Secondary | ICD-10-CM | POA: Insufficient documentation

## 2023-07-12 DIAGNOSIS — I10 Essential (primary) hypertension: Secondary | ICD-10-CM | POA: Diagnosis present

## 2023-07-12 LAB — CBC WITH DIFFERENTIAL/PLATELET
Abs Immature Granulocytes: 0.07 10*3/uL (ref 0.00–0.07)
Basophils Absolute: 0.1 10*3/uL (ref 0.0–0.1)
Basophils Relative: 1 %
Eosinophils Absolute: 0.1 10*3/uL (ref 0.0–0.5)
Eosinophils Relative: 1 %
HCT: 44 % (ref 36.0–46.0)
Hemoglobin: 14.2 g/dL (ref 12.0–15.0)
Immature Granulocytes: 1 %
Lymphocytes Relative: 19 %
Lymphs Abs: 2.2 10*3/uL (ref 0.7–4.0)
MCH: 29.6 pg (ref 26.0–34.0)
MCHC: 32.3 g/dL (ref 30.0–36.0)
MCV: 91.9 fL (ref 80.0–100.0)
Monocytes Absolute: 1 10*3/uL (ref 0.1–1.0)
Monocytes Relative: 8 %
Neutro Abs: 8 10*3/uL — ABNORMAL HIGH (ref 1.7–7.7)
Neutrophils Relative %: 70 %
Platelets: 361 10*3/uL (ref 150–400)
RBC: 4.79 MIL/uL (ref 3.87–5.11)
RDW: 14.8 % (ref 11.5–15.5)
WBC: 11.4 10*3/uL — ABNORMAL HIGH (ref 4.0–10.5)
nRBC: 0 % (ref 0.0–0.2)

## 2023-07-12 LAB — TROPONIN I (HIGH SENSITIVITY)
Troponin I (High Sensitivity): 4 ng/L (ref <18)
Troponin I (High Sensitivity): 4 ng/L (ref <18)

## 2023-07-12 LAB — COMPREHENSIVE METABOLIC PANEL
ALT: 18 U/L (ref 0–44)
AST: 32 U/L (ref 15–41)
Albumin: 2.5 g/dL — ABNORMAL LOW (ref 3.5–5.0)
Alkaline Phosphatase: 118 U/L (ref 38–126)
Anion gap: 10 (ref 5–15)
BUN: <5 mg/dL — ABNORMAL LOW (ref 6–20)
CO2: 28 mmol/L (ref 22–32)
Calcium: 8.4 mg/dL — ABNORMAL LOW (ref 8.9–10.3)
Chloride: 98 mmol/L (ref 98–111)
Creatinine, Ser: 0.61 mg/dL (ref 0.44–1.00)
GFR, Estimated: >60 mL/min (ref >60)
Glucose, Bld: 133 mg/dL — ABNORMAL HIGH (ref 70–99)
Potassium: 4.1 mmol/L (ref 3.5–5.1)
Sodium: 136 mmol/L (ref 135–145)
Total Bilirubin: 0.6 mg/dL (ref <1.2)
Total Protein: 6.6 g/dL (ref 6.5–8.1)

## 2023-07-12 LAB — ETHANOL: Alcohol, Ethyl (B): <10 mg/dL (ref <10)

## 2023-07-12 LAB — TSH: TSH: 1.84 u[IU]/mL (ref 0.350–4.500)

## 2023-07-12 LAB — AMMONIA: Ammonia: 21 umol/L (ref 9–35)

## 2023-07-12 LAB — MAGNESIUM: Magnesium: 2.2 mg/dL (ref 1.7–2.4)

## 2023-07-12 LAB — LACTIC ACID, PLASMA: Lactic Acid, Venous: 1.3 mmol/L (ref 0.5–1.9)

## 2023-07-12 LAB — PHENYTOIN LEVEL, TOTAL: Phenytoin Lvl: <2.5 ug/mL — ABNORMAL LOW (ref 10.0–20.0)

## 2023-07-12 LAB — LIPASE, BLOOD: Lipase: 85 U/L — ABNORMAL HIGH (ref 11–51)

## 2023-07-12 MED ORDER — SODIUM CHLORIDE 0.9 % IV SOLN
200.0000 mg | Freq: Two times a day (BID) | INTRAVENOUS | Status: DC
  Administered 2023-07-12: 200 mg via INTRAVENOUS
  Filled 2023-07-12: qty 20

## 2023-07-12 MED ORDER — PANTOPRAZOLE SODIUM 40 MG IV SOLR
40.0000 mg | Freq: Two times a day (BID) | INTRAVENOUS | Status: DC
  Administered 2023-07-12 – 2023-07-15 (×6): 40 mg via INTRAVENOUS
  Filled 2023-07-12 (×6): qty 10

## 2023-07-12 MED ORDER — SODIUM CHLORIDE 0.9 % IV SOLN
1000.0000 mg | Freq: Once | INTRAVENOUS | Status: AC
  Administered 2023-07-12: 1000 mg via INTRAVENOUS
  Filled 2023-07-12: qty 20

## 2023-07-12 MED ORDER — LEVETIRACETAM IN NACL 1500 MG/100ML IV SOLN
1500.0000 mg | Freq: Two times a day (BID) | INTRAVENOUS | Status: DC
  Administered 2023-07-12: 1500 mg via INTRAVENOUS
  Filled 2023-07-12: qty 100

## 2023-07-12 MED ORDER — LACOSAMIDE 50 MG PO TABS
200.0000 mg | ORAL_TABLET | Freq: Two times a day (BID) | ORAL | Status: DC
  Administered 2023-07-13 – 2023-07-15 (×5): 200 mg via ORAL
  Filled 2023-07-12 (×5): qty 4

## 2023-07-12 MED ORDER — IOHEXOL 300 MG/ML  SOLN
100.0000 mL | Freq: Once | INTRAMUSCULAR | Status: AC | PRN
  Administered 2023-07-12: 100 mL via INTRAVENOUS

## 2023-07-12 MED ORDER — NICOTINE 21 MG/24HR TD PT24
21.0000 mg | MEDICATED_PATCH | Freq: Once | TRANSDERMAL | Status: AC
  Administered 2023-07-12: 21 mg via TRANSDERMAL

## 2023-07-12 MED ORDER — OXYCODONE HCL 5 MG PO TABS
15.0000 mg | ORAL_TABLET | Freq: Four times a day (QID) | ORAL | Status: DC
  Administered 2023-07-12 – 2023-07-13 (×2): 15 mg via ORAL
  Filled 2023-07-12 (×2): qty 3

## 2023-07-12 MED ORDER — HYDROMORPHONE HCL 1 MG/ML IJ SOLN
1.0000 mg | Freq: Once | INTRAMUSCULAR | Status: AC
  Administered 2023-07-12: 1 mg via INTRAVENOUS
  Filled 2023-07-12: qty 1

## 2023-07-12 MED ORDER — PHENYTOIN SODIUM 50 MG/ML IJ SOLN
100.0000 mg | Freq: Three times a day (TID) | INTRAMUSCULAR | Status: DC
  Administered 2023-07-12: 100 mg via INTRAVENOUS
  Filled 2023-07-12 (×2): qty 2

## 2023-07-12 MED ORDER — LEVETIRACETAM IN NACL 1000 MG/100ML IV SOLN
1000.0000 mg | Freq: Once | INTRAVENOUS | Status: AC
  Administered 2023-07-12: 1000 mg via INTRAVENOUS
  Filled 2023-07-12: qty 100

## 2023-07-12 MED ORDER — PIPERACILLIN-TAZOBACTAM 3.375 G IVPB 30 MIN
3.3750 g | Freq: Once | INTRAVENOUS | Status: AC
  Administered 2023-07-12: 3.375 g via INTRAVENOUS
  Filled 2023-07-12: qty 50

## 2023-07-12 MED ORDER — FENTANYL CITRATE PF 50 MCG/ML IJ SOSY
50.0000 ug | PREFILLED_SYRINGE | Freq: Once | INTRAMUSCULAR | Status: AC
  Administered 2023-07-12: 50 ug via INTRAVENOUS
  Filled 2023-07-12: qty 1

## 2023-07-12 MED ORDER — LEVETIRACETAM 500 MG PO TABS
1500.0000 mg | ORAL_TABLET | Freq: Two times a day (BID) | ORAL | Status: DC
  Administered 2023-07-13 – 2023-07-15 (×5): 1500 mg via ORAL
  Filled 2023-07-12 (×5): qty 3

## 2023-07-12 MED ORDER — PHENYTOIN 50 MG PO CHEW
100.0000 mg | CHEWABLE_TABLET | Freq: Three times a day (TID) | ORAL | Status: DC
  Administered 2023-07-13 – 2023-07-14 (×5): 100 mg via ORAL
  Filled 2023-07-12 (×8): qty 2

## 2023-07-12 MED ORDER — HYDROMORPHONE HCL 1 MG/ML IJ SOLN
1.0000 mg | INTRAMUSCULAR | Status: DC | PRN
  Administered 2023-07-12 (×3): 1 mg via INTRAVENOUS
  Filled 2023-07-12 (×3): qty 1

## 2023-07-12 MED ORDER — HYDROMORPHONE HCL 1 MG/ML IJ SOLN
2.0000 mg | Freq: Once | INTRAMUSCULAR | Status: AC
  Administered 2023-07-12: 2 mg via INTRAVENOUS
  Filled 2023-07-12: qty 2

## 2023-07-12 MED ORDER — LACTATED RINGERS IV SOLN: INTRAVENOUS | Status: DC

## 2023-07-12 NOTE — Assessment & Plan Note (Addendum)
-  Nicotine patch 

## 2023-07-12 NOTE — ED Provider Notes (Addendum)
-----------------------------------------   5:57 PM on 07/12/2023 -----------------------------------------  I took over care of this patient from Dr. Scotty Court.  On reassessment, the patient remains alert although is requiring pain medication.  I discussed the case with Dr. Selina Cooley from neurology who advises that there is no evidence of seizure activity on the EEG.  Clinically there is also no evidence of seizure activity at this time.  The mental status is stable.  We heard back from the [facility] transfer center and they cannot accept the patient due to capacity.  I have reached out to [facility].  ----------------------------------------- 6:32 PM on 07/12/2023 -----------------------------------------  I consulted and discussed the case with Dr. Russella Dar from GI at [facility].  He has reviewed the patient's imaging and lab workup.  He advises that based on the presentation, there is no indication for urgent ERCP or other acute intervention that would require a tertiary facility.  He recommends standard management for acute pancreatitis and advises that based on his assessment the patient does not require transfer.  I then also discussed the case with Dr. Tobi Bastos from GI at [facility] who agrees with admitting the patient here.  ----------------------------------------- 7:44 PM on 07/12/2023 -----------------------------------------  I discussed case with Dr. Allena Katz from the hospitalist service for possible admission here as recommended by GI.  However, Dr. Allena Katz opened a discussion with general surgery both here and at [facility].  The recommendation from Dr. Bedelia Person of surgery at [facility] and Dr. Bebe Shaggy from surgery here is transfer to a tertiary center with a higher level of care at [facility].  Since [facility] and [facility] are both at capacity I will try alternate sites, starting with [facility].    ----------------------------------------- 9:31 PM on  07/12/2023 -----------------------------------------  I discussed the case with Dr. Doylene Canard from general surgery at [facility] who agrees with transfer there but with admission under the hospitalist service.  After discussing further with the transfer center, currently there are no beds but the patient has been placed on a wait list.  [facility] advises that they will contact us tomorrow morning for an update.  Dr. Allena Katz is evaluated the patient and will provide a consult note and put in orders for to help with her management in the ED until she can be transferred.  The patient clinically remains stable.  I will sign her out to the oncoming ED provider at 11 PM.    Dionne Bucy, MD 07/12/23 2240

## 2023-07-12 NOTE — Procedures (Signed)
Routine EEG Report  Abigail [name] [facility] is a 57 y.o. female with a history of seizure who is undergoing an EEG to evaluate for seizures.  Report: This EEG was acquired with electrodes placed according to the International 10-20 electrode system (including Fp1, Fp2, F3, F4, C3, C4, P3, P4, O1, O2, T3, T4, T5, T6, A1, A2, Fz, Cz, Pz). The following electrodes were missing or displaced: none.  The best background was continuous at approximately 5 Hz with overriding beta frequencies. This activity is reactive to stimulation. Drowsiness was manifested by background fragmentation; deeper stages of sleep were identified by K complexes and sleep spindles. There was no focal slowing. There were generalized periodic discharges with triphasic morphology that waxed and waned, at time in runs of up to 1-1.5 Hz. These did not appear epileptiform. There were no definitive interictal epileptiform discharges. There were no electrographic seizures identified. Photic stimulation and hyperventilation were not performed.  Impression and clinical correlation: This EEG was obtained while awake and asleep and is abnormal due to: - Moderate diffuse slowing indicative of global cerebral dysfunction - Triphasic waves most commonly seen in the setting of metabolic derangement.  - There were no definitive epileptiform abnormalities were not seen during this recording.  Bing Neighbors, MD [facility] [phone]  If 7pm- 7am, please page neurology on call as listed in AMION.

## 2023-07-12 NOTE — Assessment & Plan Note (Addendum)
Vitals:   07/12/23 0501 07/12/23 0530 07/12/23 0830 07/12/23 1100  BP: 110/70 (!) 105/35 103/78 (!) 130/58   07/12/23 1200 07/12/23 1430 07/12/23 1630 07/12/23 1700  BP: 120/76 (!) 119/59 (!) 130/94 (!) 117/91   07/12/23 1900 07/12/23 2200  BP: (!) 123/109 (!) 116/94  No BP meds seen in home medlist.  Goal BP 140's systolic.

## 2023-07-12 NOTE — ED Notes (Signed)
[facility] CALLED PER DR STAFFORD MD Marchia Meiers WITH [facility]

## 2023-07-12 NOTE — Assessment & Plan Note (Addendum)
Suspect 2/2 to meds. At bedside pt is A/O and we will cont her AED. D/W pharmacy to verify and order.

## 2023-07-12 NOTE — ED Notes (Signed)
XRAY  [facility]  WITH  [facility]

## 2023-07-12 NOTE — ED Provider Notes (Signed)
[facility] Provider Note    Event Date/Time   First MD Initiated Contact with Patient 07/12/23 [phone]     (approximate)   History   Altered Mental Status   HPI  Abigail [name] [facility] is a 57 y.o. female with history of seizures with noncompliance with antiepileptic medications, fibromyalgia and chronic pain on chronic opiates, rheumatoid arthritis, chronic pancreatitis who presents to the emergency department with EMS.  Patient is not able to tell us why she is here.  She states she has no complaints at this time.  She reports her son called 911 tonight.    I spoke to her son Harvie Heck by phone.  He lives with the patient.  He states that earlier today patient was complaining of left sided abdominal pain after eating.  He also states tonight that she was not moving her left arm normally and told him that she could not feel it.  He also reports that she said she was complaining of being short of breath tonight as well.  Patient denies any chest pain or shortness of breath.  She denies abdominal pain until I palpate her abdomen and she begins grimacing.  She is unable to tell me when this pain started.  She denies vomiting, diarrhea, dysuria.  She denies any numbness or weakness.  She denies any drug or alcohol use.    Son denies any recent falls.  No fevers, cough, vomiting. He states he has been giving her all of her antiepileptic medications since she was discharged from the hospital on November 14.  He states when she was admitted to the hospital at that time she was extremely confused and would not talk.  He found her on the floor prior to that admission.  She was admitted then for subclinical seizures, status epilepticus.  He states she has been more drowsy today but has been talking.  Son reports she takes oxycodone 15 mg 5 times a day which he distributes to her.    Patient has had previous hysterectomy, appendectomy.  History provided by patient, son,  EMS.    Past Medical History:  Diagnosis Date   Anxiety    Arthritis    Back pain    Basal cell carcinoma 10/04/2021   R axilla - needs excised 11/28/21   Basal cell carcinoma 10/04/2021   L antecubital excised 11/14/21   Diarrhea 11/12/2016   Fibromyalgia    Generalized abdominal pain 11/12/2016   H. pylori infection    Hyperlipidemia    IBS (irritable bowel syndrome)    Infectious colitis 04/29/2016   Migraines    Moderate dehydration 04/29/2016   Muscle pain    Opioid overdose (HCC) 12/07/2022   Reflux    Unexplained weight loss 11/12/2016    Past Surgical History:  Procedure Laterality Date   ABDOMINAL HYSTERECTOMY     APPENDECTOMY  2009   C5 FUSION     C6 FUSION     C7 FUSION     COLONOSCOPY  02/2006   COLONOSCOPY WITH PROPOFOL N/A 12/25/2016   Procedure: COLONOSCOPY WITH PROPOFOL;  Surgeon: Earline Mayotte, MD;  Location: [facility] ENDOSCOPY;  Service: Endoscopy;  Laterality: N/A;   ESOPHAGOGASTRODUODENOSCOPY (EGD) WITH PROPOFOL N/A 12/25/2016   Procedure: ESOPHAGOGASTRODUODENOSCOPY (EGD) WITH PROPOFOL;  Surgeon: Earline Mayotte, MD;  Location: [facility] ENDOSCOPY;  Service: Endoscopy;  Laterality: N/A;   FOOT SURGERY     HIP ARTHROPLASTY     L4 FUSION     L5 FUSION  S1 FUSION      MEDICATIONS:  Prior to Admission medications   Medication Sig Start Date End Date Taking? Authorizing Provider  albuterol (VENTOLIN HFA) 108 (90 Base) MCG/ACT inhaler Inhale 2 puffs into the lungs every 6 (six) hours as needed for wheezing or shortness of breath. 12/21/22   [provider]  BC FAST PAIN RELIEF 845-65 MG PACK Take 1 packet by mouth 2 (two) times daily as needed (for headaches or pain).    [provider]  Cholecalciferol (VITAMIN D3) 50 MCG (2000 UT) TABS Take 50 mcg by mouth daily.    [provider]  Cyclobenzaprine HCl (FLEXERIL PO) Take 10 tablets by mouth at bedtime.    [provider]  diazePAM, 15 MG Dose, (VALTOCO 15 MG DOSE) 2  x 7.5 MG/0.1ML LQPK Place 15 mg into the nose daily as needed (Intranasal Valtoco 15 mg for seizure lasting more than 2 minutes). 07/04/23   Ghimire, Werner Lean, MD  DULoxetine (CYMBALTA) 30 MG capsule Take 1 capsule (30 mg total) by mouth daily. Patient taking differently: Take 60 mg by mouth 2 (two) times daily. 12/26/22   Lonia Blood, MD  folic acid (FOLVITE) 1 MG tablet Take 1 mg by mouth daily.    [provider]  gabapentin (NEURONTIN) 300 MG capsule Take 300 mg by mouth 4 (four) times daily. Take 2 capsules by mouth in the morning, 1 capsule at noon, 2 capsules in the evening 05/31/23   [provider]  hydrOXYzine (ATARAX) 25 MG tablet Take 1 tablet (25 mg total) by mouth 3 (three) times daily as needed for itching or anxiety. 12/25/22   Alwyn Ren, MD  lacosamide (VIMPAT) 200 MG TABS tablet Take 1 tablet (200 mg total) by mouth 2 (two) times daily. 07/04/23   Ghimire, Werner Lean, MD  levETIRAcetam (KEPPRA) 750 MG tablet Take 2 tablets (1,500 mg total) by mouth 2 (two) times daily. 07/04/23   Ghimire, Werner Lean, MD  Magnesium 250 MG TABS Take 250 mg by mouth at bedtime.    [provider]  megestrol (MEGACE) 40 MG/ML suspension Take 200 mg by mouth daily as needed (for appetite). 03/25/23   [provider]  Melatonin 10 MG TABS Take 10 mg by mouth at bedtime.    [provider]  modafinil (PROVIGIL) 200 MG tablet Take 200 mg by mouth daily. 05/31/23   [provider]  Multiple Vitamin (MULTIVITAMIN WITH MINERALS) TABS tablet Take 1 tablet by mouth daily. 12/26/22   Kathlen Mody, MD  oxyCODONE (ROXICODONE) 15 MG immediate release tablet Take 15 mg by mouth 5 (five) times daily as needed for pain. 05/09/23   [provider]  pantoprazole (PROTONIX) 40 MG tablet Take 40 mg by mouth 2 (two) times daily before a meal.    [provider]  phenytoin (DILANTIN) 50 MG tablet Chew 2 tablets (100 mg total) by mouth 3 (three)  times daily. 07/04/23   Ghimire, Werner Lean, MD  potassium chloride (KLOR-CON) 10 MEQ tablet Take 10 mEq by mouth daily. 08/11/22   [provider]  thiamine (VITAMIN B1) 100 MG tablet Take 1 tablet (100 mg total) by mouth daily. 12/25/22   Kathlen Mody, MD  vitamin B-12 (CYANOCOBALAMIN) 500 MCG tablet Take 500 mcg by mouth daily.    [provider]    Physical Exam   Triage Vital Signs: ED Triage Vitals  Encounter Vitals Group     BP 07/12/23 0501 110/70  Systolic BP Percentile --      Diastolic BP Percentile --      Pulse Rate 07/12/23 0501 95     Resp 07/12/23 0501 (!) 21     Temp 07/12/23 0501 98.9 F (37.2 C)     Temp Source 07/12/23 0501 Oral     SpO2 07/12/23 0501 100 %     Weight --      Height --      Head Circumference --      Peak Flow --      Pain Score 07/12/23 0458 0     Pain Loc --      Pain Education --      Exclude from Growth Chart --     Most recent vital signs: Vitals:   07/12/23 0501 07/12/23 0530  BP: 110/70 (!) 105/35  Pulse: 95 93  Resp: (!) 21 19  Temp: 98.9 F (37.2 C)   SpO2: 100% 97%    CONSTITUTIONAL: Alert, oriented x 3, drowsy but arousable, answers questions and follows commands, chronically ill-appearing, appears older than stated age HEAD: Normocephalic, atraumatic EYES: Conjunctivae clear, pupils appear equal, sclera nonicteric ENT: normal nose; moist mucous membranes NECK: Supple, normal ROM CARD: RRR; S1 and S2 appreciated RESP: Normal chest excursion without splinting or tachypnea; breath sounds clear and equal bilaterally; no wheezes, no rhonchi, no rales, no hypoxia or respiratory distress, speaking full sentences ABD/GI: Non-distended; soft, tender to palpation diffusely without guarding or rebound BACK: The back appears normal EXT: Normal ROM in all joints; no deformity noted, no edema SKIN: Normal color for age and race; warm; no rash on exposed skin NEURO: Moves all extremities equally, normal speech,  reports normal sensation diffusely, no drift, no facial asymmetry PSYCH: The patient's mood and manner are appropriate.  Denies SI, HI.   ED Results / Procedures / Treatments   LABS: (all labs ordered are listed, but only abnormal results are displayed) Labs Reviewed  CBC WITH DIFFERENTIAL/PLATELET - Abnormal; Notable for the following components:      Result Value   WBC 11.4 (*)    Neutro Abs 8.0 (*)    All other components within normal limits  COMPREHENSIVE METABOLIC PANEL - Abnormal; Notable for the following components:   Glucose, Bld 133 (*)    BUN <5 (*)    Calcium 8.4 (*)    Albumin 2.5 (*)    All other components within normal limits  LIPASE, BLOOD - Abnormal; Notable for the following components:   Lipase 85 (*)    All other components within normal limits  MAGNESIUM  AMMONIA  URINALYSIS, ROUTINE W REFLEX MICROSCOPIC  URINE DRUG SCREEN, QUALITATIVE ([facility] ONLY)  ETHANOL  PHENYTOIN LEVEL, TOTAL  LEVETIRACETAM LEVEL  LACOSAMIDE  TROPONIN I (HIGH SENSITIVITY)  TROPONIN I (HIGH SENSITIVITY)     EKG:  EKG Interpretation Date/Time:  Friday July 12 2023 05:01:30 EST Ventricular Rate:  95 PR Interval:  160 QRS Duration:  77 QT Interval:  354 QTC Calculation: 445 R Axis:   -9  Text Interpretation: Sinus rhythm Probable left atrial enlargement Low voltage, precordial leads Confirmed by Rochele Raring [phone]) on 07/12/2023 5:05:20 AM         RADIOLOGY: My personal review and interpretation of imaging: Chest x-ray clear.  I have personally reviewed all radiology reports.   DG Chest Portable 1 View  Result Date: 07/12/2023 CLINICAL DATA:  57 year old female with shortness of breath and altered mental status. EXAM: PORTABLE CHEST 1 VIEW COMPARISON:  Portable chest 07/01/2023 and earlier. FINDINGS: Portable AP semi upright view at 0516 hours. Lower lung volumes and new linear, platelike atelectasis at the right lung base. Elsewhere ventilation is stable. No  pneumothorax, pulmonary edema, pleural effusion. Mediastinal contours are stable, mild tortuosity of the thoracic aorta. Visualized tracheal air column is within normal limits. Cervical ACDF. Negative visible bowel gas. IMPRESSION: Lower lung volumes with increased right lung base atelectasis. Electronically Signed   By: Odessa Fleming M.D.   On: 07/12/2023 05:58     PROCEDURES:  Critical Care performed: No     .1-3 Lead EKG Interpretation  Performed by: Jeyda Siebel, Layla Maw, DO Authorized by: Vicie Cech, Layla Maw, DO     Interpretation: normal     ECG rate:  95   ECG rate assessment: normal     Rhythm: sinus rhythm     Ectopy: none     Conduction: normal       IMPRESSION / MDM / ASSESSMENT AND PLAN / ED COURSE  I reviewed the triage vital signs and the nursing notes.    Patient here for complaints of shortness of breath and abdominal pain at home.  Patient is difficult to get a clear history from due to drowsiness.  She is on chronic opiates but also has history of subclinical seizures and medical noncompliance.  The patient is on the cardiac monitor to evaluate for evidence of arrhythmia and/or significant heart rate changes.   DIFFERENTIAL DIAGNOSIS (includes but not limited to):   Polypharmacy, intoxication, seizures, UTI, electrolyte derangement, intracranial hemorrhage, stroke, colitis, diverticulitis, bowel obstruction, pneumonia   Patient's presentation is most consistent with acute presentation with potential threat to life or bodily function.   PLAN: Will obtain CT head given altered mental status.  Will also check Dilantin, Vimpat and Keppra levels and load with IV Keppra here in the ED in case this is subclinical seizures similar to her recent presentation.  Son reports that he has been making sure that she is taking all of her seizure medicines at home.  She is drowsy but arousable but falls asleep talking to me.  Son reports when she was last admitted for status epilepticus that  she was not talking at all and was very confused which she states was not similar to her presentation today.  Will continue to monitor closely.  Will check ammonia level, ethanol level and urine drug screen.  As for her abdominal pain, patient denies this but when I press her abdomen she does grimace.  Will obtain CT of the abdomen pelvis, abdominal labs and urine.  Will avoid narcotic pain medication given patient is very drowsy.  Will also obtain chest x-ray, EKG and troponin given complaints of shortness of breath earlier today although she denies this currently and sats are 100%.   MEDICATIONS GIVEN IN ED: Medications  levETIRAcetam (KEPPRA) IVPB 1000 mg/100 mL premix (1,000 mg Intravenous New Bag/Given 07/12/23 0543)     ED COURSE: Show mild leukocytosis of 11.4.  Normal electrolytes, glucose, LFTs.  Troponin negative.  Ammonia level normal.  Chest x-ray reviewed and interpreted by myself and the radiologist and is clear.  Urine, CTs pending.  Signed out the oncoming ED physician.  If workup unremarkable and patient becomes less drowsy, anticipate discharge home.  However if patient continues to be somnolent, she may need neurologic consultation given recent admission with similar presentation for subclinical seizures.   CONSULTS: Pending further workup.   OUTSIDE RECORDS REVIEWED: Reviewed patient's recent admission for subclinical  seizures.       FINAL CLINICAL IMPRESSION(S) / ED DIAGNOSES   Final diagnoses:  Generalized abdominal pain  Shortness of breath  Somnolence     Rx / DC Orders   ED Discharge Orders     None        Note:  This document was prepared using Dragon voice recognition software and may include unintentional dictation errors.   Eveleigh Crumpler, Layla Maw, DO 07/12/23 [phone]

## 2023-07-12 NOTE — Assessment & Plan Note (Addendum)
No thyroid replacement noted.

## 2023-07-12 NOTE — ED Notes (Signed)
[facility] called Per Dr Marisa Severin MD

## 2023-07-12 NOTE — Progress Notes (Signed)
EEG complete - results pending 

## 2023-07-12 NOTE — ED Notes (Signed)
Per Doctor Siadecki [facility] at [facility] is in agreement with transfer but says it should go to medicine, so the transfer center rep is now reaching out to the hospitalist

## 2023-07-12 NOTE — Assessment & Plan Note (Addendum)
PRN pain meds. Clear liquids as tolerated only.  [facility].  Continue zosyn. Cont MIVF Cont IV PPI.  Cont  clear liquid diet only.  General surgery and GI consult once transferred.

## 2023-07-12 NOTE — ED Notes (Signed)
No beds available at the moment/ pt on waitlist

## 2023-07-12 NOTE — ED Triage Notes (Signed)
Pt to ED via EMS form home with altered mental status since last night. EMS reports patient has taken Valium, Oxycodone, and possibly some other unknown medications. Pt was d/c'd from the hospital for seizures last week. Pt is A&O x4 with no complaints at this time.

## 2023-07-12 NOTE — Consult Note (Signed)
Initial Consultation Note   Patient: Abigail [name] [facility] ZOX:[id number] DOB: [birth year] PCP: Alease Medina, MD DOA: 07/12/2023 DOS: the patient was seen and examined on 07/12/2023 Primary service: No att. providers found  Referring physician: ED provider Dr. Marisa Severin Reason for consult: medical management.  Assessment/Plan: Assessment & Plan Acute metabolic encephalopathy Suspect 2/2 to meds. At bedside pt is A/O and we will cont her AED. D/W pharmacy to verify and order.   Essential hypertension Vitals:   07/12/23 0501 07/12/23 0530 07/12/23 0830 07/12/23 1100  BP: 110/70 (!) 105/35 103/78 (!) 130/58   07/12/23 1200 07/12/23 1430 07/12/23 1630 07/12/23 1700  BP: 120/76 (!) 119/59 (!) 130/94 (!) 117/91   07/12/23 1900 07/12/23 2200  BP: (!) 123/109 (!) 116/94  No BP meds seen in home medlist.  Goal BP 140's systolic.  Hypothyroidism No thyroid replacement noted.   Chronic pancreatitis (HCC) / pancreatic pseudocyst PRN pain meds. Clear liquids as tolerated only.  [facility]  Continue zosyn. Cont MIVF Cont IV PPI.  Cont  clear liquid diet only.  General surgery and GI consult once transferred.  GERD (gastroesophageal reflux disease) IV PPI therapy. Tobacco use disorder Nicotine patch.  PAD (peripheral artery disease) (HCC) Pt has risk for PAD from her long term, ongoing smoking and autoimmune disease RA.  Tobacco cessation counseling per primary team when stable.   Seizure (HCC) Cont triple therapy with Depakote/ vimpat and keppra.   Aspiration and seizure precaution.  Neurology has been consulted by primary team.   TRH will continue to follow the patient.  HPI: Kaaren [facility] is a 57 y.o. female with past medical history of epilepsy, rheumatoid arthritis, giant cell arteritis on chronic prednisone and chronic pain syndrome on chronic opioid initially presented to [facility] emergency department for evaluation for altered mental  status. Pt has multiple chronic medical issues as well as chronic pancreatitis.  Patient also has history of disc degenerative spinal changes compression fractures and back surgery, patient also has allergies to NSAIDs tapentadol, Keflex, codeine, Darvocet, latex, Silicone, sulfa, tape and, meloxicam.  At bedside patient is appearing uncomfortable reports abdominal pain. Case d/w general surgery here Dr. Tonna Boehringer who advised transfer and [facility] gen surg as well recommend same fro higher level care than [facility] can provide here .  Review of Systems  Constitutional:  Positive for malaise/fatigue.  Gastrointestinal:  Positive for abdominal pain and nausea.    Past Medical History:  Diagnosis Date   Anxiety    Arthritis    Back pain    Basal cell carcinoma 10/04/2021   R axilla - needs excised 11/28/21   Basal cell carcinoma 10/04/2021   L antecubital excised 11/14/21   Diarrhea 11/12/2016   Fibromyalgia    Generalized abdominal pain 11/12/2016   H. pylori infection    Hyperlipidemia    IBS (irritable bowel syndrome)    Infectious colitis 04/29/2016   Migraines    Moderate dehydration 04/29/2016   Muscle pain    Opioid overdose (HCC) 12/07/2022   Reflux    Unexplained weight loss 11/12/2016   Past Surgical History:  Procedure Laterality Date   ABDOMINAL HYSTERECTOMY     APPENDECTOMY  2009   C5 FUSION     C6 FUSION     C7 FUSION     COLONOSCOPY  02/2006   COLONOSCOPY WITH PROPOFOL N/A 12/25/2016   Procedure: COLONOSCOPY WITH PROPOFOL;  Surgeon: Earline Mayotte, MD;  Location: [facility] ENDOSCOPY;  Service: Endoscopy;  Laterality: N/A;   ESOPHAGOGASTRODUODENOSCOPY (EGD) WITH PROPOFOL N/A 12/25/2016   Procedure: ESOPHAGOGASTRODUODENOSCOPY (EGD) WITH PROPOFOL;  Surgeon: Earline Mayotte, MD;  Location: [facility] ENDOSCOPY;  Service: Endoscopy;  Laterality: N/A;   FOOT SURGERY     HIP ARTHROPLASTY     L4 FUSION     L5 FUSION     S1 FUSION     Social History:  reports that she has been smoking  cigarettes. She has never used smokeless tobacco. She reports current alcohol use. She reports that she does not use drugs.  Allergies  Allergen Reactions   Nsaids Hives   Tapentadol Swelling, Rash and Other (See Comments)    Nucynta- Made her deathly sick   Cephalexin Other (See Comments)    Reaction not cited   Codeine Other (See Comments)    Reaction not cited   Darvocet [Propoxyphene N-Acetaminophen] Other (See Comments)    Reaction not cited   Latex Other (See Comments)    Reaction not cited   Silicone Other (See Comments)    Reaction not cited   Sulfa Antibiotics Hives   Tape Other (See Comments)    Reaction not cited   Meloxicam Rash    No family history on file.  Prior to Admission medications   Medication Sig Start Date End Date Taking? Authorizing Provider  albuterol (VENTOLIN HFA) 108 (90 Base) MCG/ACT inhaler Inhale 2 puffs into the lungs every 6 (six) hours as needed for wheezing or shortness of breath. 12/21/22   [provider]  BC FAST PAIN RELIEF 845-65 MG PACK Take 1 packet by mouth 2 (two) times daily as needed (for headaches or pain).    [provider]  Cholecalciferol (VITAMIN D3) 50 MCG (2000 UT) TABS Take 50 mcg by mouth daily.    [provider]  Cyclobenzaprine HCl (FLEXERIL PO) Take 10 tablets by mouth at bedtime.    [provider]  diazePAM, 15 MG Dose, (VALTOCO 15 MG DOSE) 2 x 7.5 MG/0.1ML LQPK Place 15 mg into the nose daily as needed (Intranasal Valtoco 15 mg for seizure lasting more than 2 minutes). 07/04/23   Ghimire, Werner Lean, MD  DULoxetine (CYMBALTA) 30 MG capsule Take 1 capsule (30 mg total) by mouth daily. Patient taking differently: Take 60 mg by mouth 2 (two) times daily. 12/26/22   Lonia Blood, MD  folic acid (FOLVITE) 1 MG tablet Take 1 mg by mouth daily.    [provider]  gabapentin (NEURONTIN) 300 MG capsule Take 300 mg by mouth 4 (four) times daily. Take 2 capsules by mouth in the  morning, 1 capsule at noon, 2 capsules in the evening 05/31/23   [provider]  hydrOXYzine (ATARAX) 25 MG tablet Take 1 tablet (25 mg total) by mouth 3 (three) times daily as needed for itching or anxiety. 12/25/22   Alwyn Ren, MD  lacosamide (VIMPAT) 200 MG TABS tablet Take 1 tablet (200 mg total) by mouth 2 (two) times daily. 07/04/23   Ghimire, Werner Lean, MD  levETIRAcetam (KEPPRA) 750 MG tablet Take 2 tablets (1,500 mg total) by mouth 2 (two) times daily. 07/04/23   Ghimire, Werner Lean, MD  Magnesium 250 MG TABS Take 250 mg by mouth at bedtime.    [provider]  megestrol (MEGACE) 40 MG/ML suspension Take 200 mg by mouth daily as needed (for appetite). 03/25/23   [provider]  Melatonin 10 MG TABS Take 10 mg by mouth at bedtime.    [provider]  modafinil (PROVIGIL) 200 MG tablet Take 200 mg by mouth daily. 05/31/23   [provider]  Multiple Vitamin (MULTIVITAMIN WITH MINERALS) TABS tablet Take 1 tablet by mouth daily. 12/26/22   Kathlen Mody, MD  oxyCODONE (ROXICODONE) 15 MG immediate release tablet Take 15 mg by mouth 5 (five) times daily as needed for pain. 05/09/23   [provider]  pantoprazole (PROTONIX) 40 MG tablet Take 40 mg by mouth 2 (two) times daily before a meal.    [provider]  phenytoin (DILANTIN) 50 MG tablet Chew 2 tablets (100 mg total) by mouth 3 (three) times daily. 07/04/23   Ghimire, Werner Lean, MD  potassium chloride (KLOR-CON) 10 MEQ tablet Take 10 mEq by mouth daily. 08/11/22   [provider]  thiamine (VITAMIN B1) 100 MG tablet Take 1 tablet (100 mg total) by mouth daily. 12/25/22   Kathlen Mody, MD  vitamin B-12 (CYANOCOBALAMIN) 500 MCG tablet Take 500 mcg by mouth daily.    [provider]    Physical Exam: Vitals:   07/12/23 1700 07/12/23 1900 07/12/23 2037 07/12/23 2200  BP: (!) 117/91 (!) 123/109  (!) 116/94  Pulse: 98 95  (!) 104  Resp: 17 18  (!) 21  Temp:    98 F (36.7 C)   TempSrc:   Oral   SpO2: 94% 100%  94%   Physical Exam Constitutional:      Appearance: She is ill-appearing.  HENT:     Head: Normocephalic and atraumatic.  Cardiovascular:     Rate and Rhythm: Regular rhythm. Tachycardia present.     Pulses:          Dorsalis pedis pulses are 1+ on the right side and 1+ on the left side.       Posterior tibial pulses are 1+ on the right side and 1+ on the left side.     Heart sounds: Normal heart sounds.     Comments: Pt has livedo pattern on her body is not hypotensive but will keep bp goal at 140's.  Pulmonary:     Effort: Pulmonary effort is normal.     Breath sounds: Normal breath sounds.  Abdominal:     General: There is no distension.     Palpations: Abdomen is soft.     Tenderness: There is abdominal tenderness. There is guarding.  Musculoskeletal:     Right lower leg: No edema.     Left lower leg: No edema.  Neurological:     Mental Status: She is alert.     Results for orders placed or performed during the hospital encounter of 07/12/23 (from the past 24 hour(s))  CBC with Differential/Platelet     Status: Abnormal   Collection Time: 07/12/23  5:00 AM  Result Value Ref Range   WBC 11.4 (H) 4.0 - 10.5 K/uL   RBC 4.79 3.87 - 5.11 MIL/uL   Hemoglobin 14.2 12.0 - 15.0 g/dL   HCT 44.0 10.2 - 72.5 %   MCV 91.9 80.0 - 100.0 fL   MCH 29.6 26.0 - 34.0 pg   MCHC 32.3 30.0 - 36.0 g/dL   RDW 36.6 44.0 - 34.7 %   Platelets 361 150 - 400 K/uL   nRBC 0.0 0.0 - 0.2 %   Neutrophils Relative % 70 %   Neutro Abs 8.0 (H) 1.7 - 7.7 K/uL   Lymphocytes Relative 19 %   Lymphs Abs 2.2 0.7 - 4.0 K/uL   Monocytes Relative 8 %  Monocytes Absolute 1.0 0.1 - 1.0 K/uL   Eosinophils Relative 1 %   Eosinophils Absolute 0.1 0.0 - 0.5 K/uL   Basophils Relative 1 %   Basophils Absolute 0.1 0.0 - 0.1 K/uL   Immature Granulocytes 1 %   Abs Immature Granulocytes 0.07 0.00 - 0.07 K/uL  Comprehensive metabolic panel     Status: Abnormal    Collection Time: 07/12/23  5:00 AM  Result Value Ref Range   Sodium 136 135 - 145 mmol/L   Potassium 4.1 3.5 - 5.1 mmol/L   Chloride 98 98 - 111 mmol/L   CO2 28 22 - 32 mmol/L   Glucose, Bld 133 (H) 70 - 99 mg/dL   BUN <5 (L) 6 - 20 mg/dL   Creatinine, Ser 1.61 0.44 - 1.00 mg/dL   Calcium 8.4 (L) 8.9 - 10.3 mg/dL   Total Protein 6.6 6.5 - 8.1 g/dL   Albumin 2.5 (L) 3.5 - 5.0 g/dL   AST 32 15 - 41 U/L   ALT 18 0 - 44 U/L   Alkaline Phosphatase 118 38 - 126 U/L   Total Bilirubin 0.6 <1.2 mg/dL   GFR, Estimated >09 >60 mL/min   Anion gap 10 5 - 15  Lipase, blood     Status: Abnormal   Collection Time: 07/12/23  5:00 AM  Result Value Ref Range   Lipase 85 (H) 11 - 51 U/L  Troponin I (High Sensitivity)     Status: None   Collection Time: 07/12/23  5:00 AM  Result Value Ref Range   Troponin I (High Sensitivity) 4 <18 ng/L  Magnesium     Status: None   Collection Time: 07/12/23  5:00 AM  Result Value Ref Range   Magnesium 2.2 1.7 - 2.4 mg/dL  Ammonia     Status: None   Collection Time: 07/12/23  5:35 AM  Result Value Ref Range   Ammonia 21 9 - 35 umol/L  Troponin I (High Sensitivity)     Status: None   Collection Time: 07/12/23  7:23 AM  Result Value Ref Range   Troponin I (High Sensitivity) 4 <18 ng/L  Ethanol     Status: None   Collection Time: 07/12/23  7:23 AM  Result Value Ref Range   Alcohol, Ethyl (B) <10 <10 mg/dL  Phenytoin level, total     Status: Abnormal   Collection Time: 07/12/23  7:23 AM  Result Value Ref Range   Phenytoin Lvl <2.5 (L) 10.0 - 20.0 ug/mL  TSH     Status: None   Collection Time: 07/12/23  7:23 AM  Result Value Ref Range   TSH 1.840 0.350 - 4.500 uIU/mL     Family Communication:None.   Primary team communication: ED. Thank you very much for involving [place] in the care of your patient.  Author: Gertha Calkin, MD 07/12/2023 10:53 PM  For on call review [email or web address].

## 2023-07-12 NOTE — Assessment & Plan Note (Addendum)
Cont triple therapy with Depakote/ vimpat and keppra.   Aspiration and seizure precaution.  Neurology has been consulted by primary team.

## 2023-07-12 NOTE — Assessment & Plan Note (Addendum)
IV PPI therapy.  

## 2023-07-12 NOTE — ED Provider Notes (Signed)
11:38 PM  Pt screaming in pain despite IV fentanyl.  She has as needed IV Dilaudid ordered.  She is also on oxycodone 15 mg 5 times daily at home.  Will reorder this for 4 times daily and instructed nurse to only give sips of water with her medication but otherwise keep her n.p.o.  She is awaiting transfer to [facility] once a bed is available for complicated pancreatitis.   Vincenza Dail, Layla Maw, DO 07/12/23 2339

## 2023-07-12 NOTE — ED Provider Notes (Signed)
Procedures ----------------------------------------- 10:58 AM on 07/12/2023 ----------------------------------------- CT reveals acute severe complicated pancreatitis with enlarging pseudocyst and splenic infarct.  Patient has previously been cared for at [facility].  Discussed with [facility] surgery team who confirms patient will be best served by transfer back to [facility] if possible.  She is having ongoing abdominal pain.  Currently awake, oriented and lucid.  She does report increased frequency of headaches lately.  She says she has been compliant with her antiepileptic medications.  She denies overuse of her opioid pain medication or addition of other sedative medications.    Clinical Course as of 07/12/23 1519  Fri Jul 12, 2023  1205 [facility] at capacity, not able to accept pt [PS]  1500 Dr. Selina Cooley reviewed EEG, notes there is no seizure activity and overall reassuring EEG. [PS]  1517 D/w [facility] transfer center [facility] who will contact medical team for transfer. [PS]    Clinical Course User Index [PS] Sharman Cheek, MD    Final diagnoses:  Generalized abdominal pain  Acute on chronic pancreatitis [facility])  Splenic infarct       Sharman Cheek, MD 07/12/23 [phone]

## 2023-07-12 NOTE — ED Notes (Signed)
Pt crying out in pain.  When RN at bedside pt attempts to hold conversation but closes eyes frequently and appears somnolent.  Advised patient when her next available pain medication is due. Pt continues to cry out from her room.

## 2023-07-12 NOTE — Assessment & Plan Note (Addendum)
Pt has risk for PAD from her long term, ongoing smoking and autoimmune disease RA.  Tobacco cessation counseling per primary team when stable.

## 2023-07-12 NOTE — ED Notes (Signed)
Pt continually removing monitor equipment, clothing, and O2.  Responds to redirection but continues to remove things when the RN leaves the room.

## 2023-07-13 DIAGNOSIS — K863 Pseudocyst of pancreas: Secondary | ICD-10-CM

## 2023-07-13 DIAGNOSIS — D735 Infarction of spleen: Secondary | ICD-10-CM

## 2023-07-13 DIAGNOSIS — K219 Gastro-esophageal reflux disease without esophagitis: Secondary | ICD-10-CM

## 2023-07-13 DIAGNOSIS — K861 Other chronic pancreatitis: Secondary | ICD-10-CM

## 2023-07-13 DIAGNOSIS — R569 Unspecified convulsions: Secondary | ICD-10-CM

## 2023-07-13 DIAGNOSIS — K859 Acute pancreatitis without necrosis or infection, unspecified: Secondary | ICD-10-CM

## 2023-07-13 DIAGNOSIS — F17200 Nicotine dependence, unspecified, uncomplicated: Secondary | ICD-10-CM

## 2023-07-13 LAB — URINALYSIS, ROUTINE W REFLEX MICROSCOPIC
Bilirubin Urine: NEGATIVE
Glucose, UA: NEGATIVE mg/dL
Hgb urine dipstick: NEGATIVE
Ketones, ur: NEGATIVE mg/dL
Leukocytes,Ua: NEGATIVE
Nitrite: NEGATIVE
Protein, ur: NEGATIVE mg/dL
Specific Gravity, Urine: 1.039 — ABNORMAL HIGH (ref 1.005–1.030)
pH: 6 (ref 5.0–8.0)

## 2023-07-13 LAB — BASIC METABOLIC PANEL
Anion gap: 8 (ref 5–15)
BUN: 9 mg/dL (ref 6–20)
CO2: 30 mmol/L (ref 22–32)
Calcium: 8 mg/dL — ABNORMAL LOW (ref 8.9–10.3)
Chloride: 98 mmol/L (ref 98–111)
Creatinine, Ser: 0.4 mg/dL — ABNORMAL LOW (ref 0.44–1.00)
GFR, Estimated: >60 mL/min (ref >60)
Glucose, Bld: 116 mg/dL — ABNORMAL HIGH (ref 70–99)
Potassium: 3.9 mmol/L (ref 3.5–5.1)
Sodium: 136 mmol/L (ref 135–145)

## 2023-07-13 LAB — CBC
HCT: 36.6 % (ref 36.0–46.0)
Hemoglobin: 11.7 g/dL — ABNORMAL LOW (ref 12.0–15.0)
MCH: 29.3 pg (ref 26.0–34.0)
MCHC: 32 g/dL (ref 30.0–36.0)
MCV: 91.5 fL (ref 80.0–100.0)
Platelets: 256 10*3/uL (ref 150–400)
RBC: 4 MIL/uL (ref 3.87–5.11)
RDW: 15.1 % (ref 11.5–15.5)
WBC: 13 10*3/uL — ABNORMAL HIGH (ref 4.0–10.5)
nRBC: 0 % (ref 0.0–0.2)

## 2023-07-13 LAB — URINE DRUG SCREEN, QUALITATIVE (ARMC ONLY)
Amphetamines, Ur Screen: NOT DETECTED
Barbiturates, Ur Screen: NOT DETECTED
Benzodiazepine, Ur Scrn: POSITIVE — AB
Cannabinoid 50 Ng, Ur ~~LOC~~: POSITIVE — AB
Cocaine Metabolite,Ur ~~LOC~~: NOT DETECTED
MDMA (Ecstasy)Ur Screen: NOT DETECTED
Methadone Scn, Ur: NOT DETECTED
Opiate, Ur Screen: POSITIVE — AB
Phencyclidine (PCP) Ur S: NOT DETECTED
Tricyclic, Ur Screen: POSITIVE — AB

## 2023-07-13 LAB — LIPASE, BLOOD: Lipase: 114 U/L — ABNORMAL HIGH (ref 11–51)

## 2023-07-13 MED ORDER — OXYCODONE HCL 5 MG PO TABS: 15.0000 mg | ORAL_TABLET | ORAL | Status: DC | PRN

## 2023-07-13 MED ORDER — HYDROMORPHONE HCL 1 MG/ML IJ SOLN
1.0000 mg | INTRAMUSCULAR | Status: DC | PRN
  Administered 2023-07-13 (×3): 1 mg via INTRAVENOUS
  Filled 2023-07-13 (×3): qty 1

## 2023-07-13 MED ORDER — HYDROXYZINE HCL 25 MG PO TABS: 25.0000 mg | ORAL_TABLET | Freq: Three times a day (TID) | ORAL | Status: DC | PRN

## 2023-07-13 MED ORDER — GABAPENTIN 300 MG PO CAPS
600.0000 mg | ORAL_CAPSULE | Freq: Two times a day (BID) | ORAL | Status: DC
  Administered 2023-07-13 – 2023-07-15 (×5): 600 mg via ORAL
  Filled 2023-07-13 (×5): qty 2

## 2023-07-13 MED ORDER — MELATONIN 5 MG PO TABS
10.0000 mg | ORAL_TABLET | Freq: Every day | ORAL | Status: DC
  Administered 2023-07-13 – 2023-07-14 (×2): 10 mg via ORAL
  Filled 2023-07-13 (×2): qty 2

## 2023-07-13 MED ORDER — THIAMINE HCL 100 MG PO TABS
100.0000 mg | ORAL_TABLET | Freq: Every day | ORAL | Status: DC
  Administered 2023-07-13 – 2023-07-15 (×3): 100 mg via ORAL
  Filled 2023-07-13 (×6): qty 1

## 2023-07-13 MED ORDER — KETAMINE HCL 50 MG/5ML IJ SOSY
0.3000 mg/kg | PREFILLED_SYRINGE | Freq: Once | INTRAMUSCULAR | Status: AC
  Administered 2023-07-13: 22 mg via INTRAVENOUS
  Filled 2023-07-13: qty 5

## 2023-07-13 MED ORDER — OXYCODONE HCL 5 MG PO TABS
15.0000 mg | ORAL_TABLET | Freq: Four times a day (QID) | ORAL | Status: DC
  Administered 2023-07-13 – 2023-07-15 (×10): 15 mg via ORAL
  Filled 2023-07-13 (×10): qty 3

## 2023-07-13 MED ORDER — LACTATED RINGERS IV SOLN: INTRAVENOUS | Status: DC

## 2023-07-13 MED ORDER — HYDROMORPHONE HCL 1 MG/ML IJ SOLN
1.0000 mg | Freq: Once | INTRAMUSCULAR | Status: AC
  Administered 2023-07-13: 1 mg via INTRAVENOUS
  Filled 2023-07-13: qty 1

## 2023-07-13 MED ORDER — GABAPENTIN 300 MG PO CAPS
300.0000 mg | ORAL_CAPSULE | Freq: Every day | ORAL | Status: DC
  Administered 2023-07-13 – 2023-07-15 (×3): 300 mg via ORAL
  Filled 2023-07-13 (×3): qty 1

## 2023-07-13 NOTE — ED Notes (Signed)
RN to bedside to introduce self to pt. Pt is asleep.

## 2023-07-13 NOTE — Assessment & Plan Note (Signed)
Continue to monitor.  Patient given the name of a physician that does endoscopic ultrasounds at [facility] as outpatient.

## 2023-07-13 NOTE — ED Notes (Signed)
Pt flagged down a hospital staff member who told this RN that Pt requested help. This RN went assist the pt and pt reported that they needed to use the restroom. Pt was told that we'd use a bedpan. When this RN pulled the sheets back in order to place the bedpan, pt's sheets were soiled. When the pt was told that it looked like they had already gone to the restroom and asked if they needed to go more, pt stated that they didn't need to go any longer. Pt was then told that we would need to change the linens and get them clean. This RN turned around to grab clean linens and when I turned back around pt was urinating on themselves. When asked why they urinated on themselves when they were just asked if they still needed to use the restroom, pt did not give an answer. Pt's linens were then changed, new chuck pad applied and pt was cleaned. Pt's bed was returned to the lowest locked position, with call bell in reach. Pt is clean and dry at this time.

## 2023-07-13 NOTE — Hospital Course (Addendum)
57 year old female past medical history of epilepsy, chronic pain syndrome on chronic opioid treatment, chronic pancreatic.  Presents to the ER with altered mental status.  Patient had a CT scan of the abdomen pelvis showed highly abnormal abdomen with severe complicated pancreatitis with multiple enlarging pancreatic pseudocyst with mass effect on the spleen, splenic infarct.  Patient awaiting transfer to tertiary care center.  11/23.  Patient feeling better with regards to her pain.  Patient awaiting transfer to tertiary care center.  Will start liquid diet. 11/24.  Patient has no pain since yesterday.  She recalled that she ate a casserole and ate a lot of it and this may not have agreed with her and had some severe pain.  Patient interested in potentially going home.

## 2023-07-13 NOTE — Assessment & Plan Note (Signed)
Continue Neurontin, Vimpat, Keppra and Dilantin.

## 2023-07-13 NOTE — Assessment & Plan Note (Signed)
IV PPI therapy.

## 2023-07-13 NOTE — Assessment & Plan Note (Signed)
Usually if somebody had a splenic infarct that still be having pain so I do not think that this is acute.  Hesitant on anticoagulation in a patient with these large pseudocysts and possible hematoma.

## 2023-07-13 NOTE — Progress Notes (Signed)
  Progress Note   Patient: Abigail [name] [facility] WUJ:[id number] DOB: 04-May-1966 DOA: 07/12/2023     0 DOS: the patient was seen and examined on 07/13/2023   Brief hospital course: 57 year old female past medical history of epilepsy, chronic pain syndrome on chronic opioid treatment, chronic pancreatic.  Presents to the ER with altered mental status.  Patient had a CT scan of the abdomen pelvis showed highly abnormal abdomen with severe complicated pancreatitis with multiple enlarging pancreatic pseudocyst with mass effect on the spleen, splenic infarct.  Patient awaiting transfer to tertiary care center.  11/23.  Patient feeling better with regards to her pain.  Patient awaiting transfer to tertiary care center.  Will start liquid diet.  Assessment and Plan: * Acute on chronic pancreatitis (HCC) Continue IV fluid.  Since patient's pain is less will start clear liquid diet.  If further pain may have to make n.p.o.  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  Awaiting transfer to tertiary care center.  Acute metabolic encephalopathy Improved   GERD (gastroesophageal reflux disease) IV PPI therapy.  Splenic infarct Can consider anticoagulation but at this point with acute on chronic pancreatitis and enlarging pseudocyst I will hold off.  Especially since pain is better.  Seizure (HCC) Continue Neurontin, Vimpat, Keppra and Dilantin.  Tobacco use disorder Nicotine patch.         Subjective: Patient feeling better this morning than yesterday.  Had severe pain yesterday and could not stand it and was brought into the ER.  Received numerous medications overnight.  Admitted with acute on chronic pancreatitis.  Physical Exam: Vitals:   07/13/23 0830 07/13/23 0900 07/13/23 0930 07/13/23 1000  BP: (!) 96/45 (!) 96/51 104/75 (!) 104/55  Pulse: 93 95 95 94  Resp: 13 19 12 18   Temp:      TempSrc:      SpO2: 99% 100% 98% 100%   Physical Exam HENT:     Head: Normocephalic.      Mouth/Throat:     Pharynx: No oropharyngeal exudate.  Eyes:     General: Lids are normal.     Conjunctiva/sclera: Conjunctivae normal.  Cardiovascular:     Rate and Rhythm: Normal rate and regular rhythm.     Heart sounds: Normal heart sounds, S1 normal and S2 normal.  Pulmonary:     Breath sounds: No decreased breath sounds, wheezing, rhonchi or rales.  Abdominal:     Palpations: Abdomen is soft.     Tenderness: There is abdominal tenderness in the epigastric area.  Musculoskeletal:     Right lower leg: No swelling.     Left lower leg: No swelling.  Skin:    General: Skin is warm.     Findings: No rash.  Neurological:     Mental Status: She is alert and oriented to person, place, and time.     Data Reviewed: Creatinine 0.4, white blood cell count 13, hemoglobin 11.7, calcium 8    Disposition: Patient awaiting transfer to tertiary care center.  Planned Discharge Destination: To tertiary care center when bed available    Time spent: 28 minutes  Author: Alford Highland, MD 07/13/2023 1:46 PM  For on call review [email or web address].

## 2023-07-13 NOTE — Assessment & Plan Note (Signed)
Improved

## 2023-07-13 NOTE — Assessment & Plan Note (Signed)
Usually pancreatitis pain does not go away this quickly.  Unlikely acute on chronic pancreatitis.  Patient tolerating diet.

## 2023-07-13 NOTE — Assessment & Plan Note (Signed)
Nicotine patch.

## 2023-07-14 ENCOUNTER — Other Ambulatory Visit: Payer: Self-pay

## 2023-07-14 ENCOUNTER — Emergency Department: Payer: Medicare HMO

## 2023-07-14 DIAGNOSIS — R1010 Upper abdominal pain, unspecified: Secondary | ICD-10-CM

## 2023-07-14 LAB — CBC
HCT: 41.6 % (ref 36.0–46.0)
Hemoglobin: 13 g/dL (ref 12.0–15.0)
MCH: 29.4 pg (ref 26.0–34.0)
MCHC: 31.3 g/dL (ref 30.0–36.0)
MCV: 94.1 fL (ref 80.0–100.0)
Platelets: 289 10*3/uL (ref 150–400)
RBC: 4.42 MIL/uL (ref 3.87–5.11)
RDW: 15.2 % (ref 11.5–15.5)
WBC: 14.9 10*3/uL — ABNORMAL HIGH (ref 4.0–10.5)
nRBC: 0 % (ref 0.0–0.2)

## 2023-07-14 LAB — COMPREHENSIVE METABOLIC PANEL
ALT: 13 U/L (ref 0–44)
AST: 20 U/L (ref 15–41)
Albumin: 2.3 g/dL — ABNORMAL LOW (ref 3.5–5.0)
Alkaline Phosphatase: 109 U/L (ref 38–126)
Anion gap: 11 (ref 5–15)
BUN: 7 mg/dL (ref 6–20)
CO2: 29 mmol/L (ref 22–32)
Calcium: 8.3 mg/dL — ABNORMAL LOW (ref 8.9–10.3)
Chloride: 95 mmol/L — ABNORMAL LOW (ref 98–111)
Creatinine, Ser: 0.57 mg/dL (ref 0.44–1.00)
GFR, Estimated: >60 mL/min (ref >60)
Glucose, Bld: 77 mg/dL (ref 70–99)
Potassium: 3.8 mmol/L (ref 3.5–5.1)
Sodium: 135 mmol/L (ref 135–145)
Total Bilirubin: 0.6 mg/dL (ref <1.2)
Total Protein: 6.5 g/dL (ref 6.5–8.1)

## 2023-07-14 MED ORDER — IPRATROPIUM-ALBUTEROL 0.5-2.5 (3) MG/3ML IN SOLN
3.0000 mL | Freq: Four times a day (QID) | RESPIRATORY_TRACT | Status: DC
  Administered 2023-07-14 – 2023-07-15 (×6): 3 mL via RESPIRATORY_TRACT
  Filled 2023-07-14 (×6): qty 3

## 2023-07-14 MED ORDER — NICOTINE 14 MG/24HR TD PT24
14.0000 mg | MEDICATED_PATCH | Freq: Once | TRANSDERMAL | Status: AC
  Administered 2023-07-14: 14 mg via TRANSDERMAL
  Filled 2023-07-14: qty 1

## 2023-07-14 MED ORDER — PHENYTOIN SODIUM 50 MG/ML IJ SOLN
100.0000 mg | Freq: Three times a day (TID) | INTRAMUSCULAR | Status: DC
  Administered 2023-07-14 – 2023-07-15 (×2): 100 mg via INTRAVENOUS
  Filled 2023-07-14 (×3): qty 2

## 2023-07-14 MED ORDER — FUROSEMIDE 10 MG/ML IJ SOLN
40.0000 mg | Freq: Once | INTRAMUSCULAR | Status: AC
  Administered 2023-07-14: 40 mg via INTRAVENOUS
  Filled 2023-07-14: qty 4

## 2023-07-14 NOTE — ED Notes (Signed)
Dr Hilton Sinclair at bedside. Pt walked to toilet and back. Pt provided soda and ice per request.

## 2023-07-14 NOTE — ED Notes (Addendum)
Patient voicing to RN that she was able to keep down "pot roast and 1 baked potato as well as the fruit".

## 2023-07-14 NOTE — ED Notes (Signed)
Patient passed PO challenge.

## 2023-07-14 NOTE — ED Notes (Signed)
Pt ate 50% of lunch tray

## 2023-07-14 NOTE — ED Notes (Signed)
Called [facility]/ Rep: Alicia/ No beds available at the moment

## 2023-07-14 NOTE — ED Notes (Signed)
Called [facility] spoke with [facility], she states pt is still on WAITLIST

## 2023-07-14 NOTE — ED Provider Notes (Deleted)
Patient has been seen by hospitalist today.  Plan was to discharge if tolerating p.o. as the patient is feeling significantly improved.  Does not seem to be having findings or symptoms consistent with acute pancreatitis.  Would be appropriate for outpatient follow-up regarding pseudocyst.  Has been provided referral.   Willy Eddy, MD 07/14/23 1342

## 2023-07-14 NOTE — ED Notes (Signed)
Dr Hilton Sinclair was at bedside. Pt may discharge home if able to tolerate PO challenge.

## 2023-07-14 NOTE — ED Notes (Signed)
Went in to see patient this morning for annual rounding. Communicated with patient about her transfer. She states "I think it is just my pancreatic pain nothing to do with seizures, Can't I just go home? I don't want to transfer to [facility]". RN communicated with patient that she would not being transferring to [place]. Patient stated "I have a neurologist appointment in 2 weeks and I don't want to mess that up. Can't I just go home?" RN communicated to Patient that RN would talk with Provider about transfer. RN informed EDP who is sending a message to the Hospitalist that will come and see this patient on their morning rounding. Provided the patient with ice water and unit phone that she requested.

## 2023-07-14 NOTE — ED Notes (Signed)
Wrote pharmacy to communicate missing dose of Phenytoin chewable tablet.

## 2023-07-14 NOTE — Discharge Instructions (Addendum)
You have been seen in the emergency department for emergency care. It is important that you contact your own doctor, specialist or the closest clinic for follow-up care. Please bring this instruction sheet, all medications and X-ray copies with you when you are seen for follow-up care.  Determining the exact cause for all patients with abdominal pain is extremely difficult in the emergency department. Our primary focus is to rule-out immediate life-threatening diseases. If no immediate source of pain is found the definitive diagnosis frequently needs to be determined over time.Many times your primary care physician can determine the cause by following the symptoms over time. Sometimes, specialist are required such as Gastroenterologists, Gynecologists, Urologists or Surgeons. Please return immediately to the Emergency Department for fever>101, Vomiting or Intractable Pain. You should return to the emergency department or see your primary care provider in 12-24hrs if your pain is no better and sooner if your pain becomes worse.   IMPRESSION: 1. Highly abnormal abdomen, constellation of which most compatible with severely complicated pancreatitis:   2. Acute Pancreatitis with multiple enlarging Pancreatic Pseudocysts, the largest 2 are 4.6 and 5.2 cm diameter with mass effect on the Spleen (see #3), stomach, liver.   3. Splenic Infarct with small volume subcapsular and perisplenic fluid.   4. Mass effect on the portal venous structures which remain patent at this time.   5. Other multi-spatial upper abdominal inflammation including tracking to the distal esophagus through the gastric hiatus, at the left adrenal. And small volume of free fluid in the pelvis.   6. No bowel obstruction.   7. Aortic Atherosclerosis (ICD10-I70.0). No dissection or filling defect within the visible aorta or major arterial structures.     Electronically Signed   By: Odessa Fleming M.D.   On: 07/12/2023 08:21

## 2023-07-14 NOTE — Progress Notes (Signed)
Progress Note   Patient: Abigail [name] DOB: [birth year] DOA: 07/12/2023     0 DOS: the patient was seen and examined on 07/14/2023   Brief hospital course: 57 year old female past medical history of epilepsy, chronic pain syndrome on chronic opioid treatment, chronic pancreatic.  Presents to the ER with altered mental status.  Patient had a CT scan of the abdomen pelvis showed highly abnormal abdomen with severe complicated pancreatitis with multiple enlarging pancreatic pseudocyst with mass effect on the spleen, splenic infarct.  Patient awaiting transfer to tertiary care center.  11/23.  Patient feeling better with regards to her pain.  Patient awaiting transfer to tertiary care center.  Will start liquid diet. 11/24.  Patient has no pain since yesterday.  She recalled that she ate a casserole and ate a lot of it and this may not have agreed with her and had some severe pain.  Patient interested in potentially going home.  Assessment and Plan: * Abdominal pain Patient's pain went away yesterday and has not come back yet.  Patient states that she ate a casserole and this may have been the cause of her pain.  Normally does not eat as much as she did.  Advance diet and see how she does if does okay with diet can potentially go home.  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  Patient given the name of a physician that does endoscopic ultrasounds at [facility] as outpatient.  Acute on chronic pancreatitis (HCC) Since the patient not having any pain we will advance diet and see how she does.  Usually pancreatitis pain does not go away this quickly.  Splenic infarct Usually if somebody had a splenic infarct that still be having pain so I do not think that this is acute.  Hesitant on anticoagulation in a patient with these large pseudocyst.  Acute metabolic encephalopathy Improved   GERD (gastroesophageal reflux disease) IV PPI therapy while here and can go to oral once  discharge.  Seizure (HCC) Continue Neurontin, Vimpat, Keppra and Dilantin.  Tobacco use disorder Nicotine patch.         Subjective: Patient having no pain today.  She remembers eating a casserole the other day which may have caused her pain.  Normally she does not eat that much but ate a lot that day.  Admitted with suspected acute pancreatitis.  Physical Exam: Vitals:   07/14/23 0415 07/14/23 0600 07/14/23 0900 07/14/23 0945  BP:  (!) 105/50 106/69   Pulse: 87 (!) 101 96 94  Resp: 15 19 18 18   Temp:   98.2 F (36.8 C)   TempSrc:   Oral   SpO2: 96% 95% 100% 97%   Physical Exam HENT:     Head: Normocephalic.     Mouth/Throat:     Pharynx: No oropharyngeal exudate.  Eyes:     General: Lids are normal.     Conjunctiva/sclera: Conjunctivae normal.  Cardiovascular:     Rate and Rhythm: Normal rate and regular rhythm.     Heart sounds: Normal heart sounds, S1 normal and S2 normal.  Pulmonary:     Breath sounds: No decreased breath sounds, wheezing, rhonchi or rales.  Abdominal:     Palpations: Abdomen is soft.     Tenderness: There is no abdominal tenderness.  Musculoskeletal:     Right lower leg: No swelling.     Left lower leg: No swelling.  Skin:    General: Skin is warm.     Findings: No rash.  Neurological:     Mental Status: She is alert and oriented to person, place, and time.     Data Reviewed: Creatinine 0.57, hemoglobin 13, white blood count 14.9   Disposition: Patient was awaiting for transfer to tertiary care center.  Patient's pain has improved and asking to go home.  Case discussed with ER physician and we will start a diet and see how she tolerates this.  If she tolerates this without pain can be discharged home with follow-up as outpatient.  Name for physician at [facility] that does endoscopic ultrasound given.  Planned Discharge Destination: Home    Time spent: 28 minutes Case discussed with ER physician.  Author: Alford Highland,  MD 07/14/2023 12:03 PM  For on call review [email or web address].

## 2023-07-14 NOTE — ED Notes (Signed)
PO food challenge administered to patient. Apple sauce and saltine crackers.

## 2023-07-14 NOTE — ED Notes (Signed)
Pt up to void again.

## 2023-07-14 NOTE — Progress Notes (Addendum)
Patient's pulse ox with walking around was 73%. Patient is a smoker.  Decreased breath sounds bilaterally.  Will give a dose of Lasix and discontinue fluid.  Will give nebulizer treatment.  Physical Exam Pulmonary:     Breath sounds: Examination of the right-middle field reveals decreased breath sounds. Examination of the left-middle field reveals decreased breath sounds. Examination of the right-lower field reveals decreased breath sounds and rhonchi. Examination of the left-lower field reveals decreased breath sounds and rhonchi. Decreased breath sounds and rhonchi present.     Time spent 15 minutes, case discussed with ER physician. Dr. Alford Highland

## 2023-07-14 NOTE — Assessment & Plan Note (Signed)
Patient's pain went away yesterday and has not come back yet.  Patient states that she ate a casserole and this may have been the cause of her pain.  Normally does not eat as much as she did.  Advance diet and see how she does if does okay with diet can potentially go home.

## 2023-07-14 NOTE — ED Provider Notes (Signed)
-----------------------------------------   5:59 AM on 07/14/2023 -----------------------------------------   No events overnight. Remains in the ED awaiting transfer.   Irean Hong, MD 07/14/23 [phone]

## 2023-07-14 NOTE — ED Notes (Addendum)
Patient did not eat breakfast. MD at bedside now.

## 2023-07-14 NOTE — ED Notes (Signed)
Pt tolerated PO challenge with no pain or vomiting. Pt wishes to go home. EDP informed.

## 2023-07-14 NOTE — ED Notes (Signed)
Pt hooked back up to tele monitor and provided with the phone at this time.

## 2023-07-14 NOTE — Evaluation (Signed)
Physical Therapy Evaluation Patient Details Name: Remii Adriano [facility] MRN: [id number] DOB: 12/27/1965 Today's Date: 07/14/2023  History of Present Illness  57 year old female past medical history of epilepsy, chronic pain syndrome on chronic opioid treatment, chronic pancreatic.  Presents to the ER with altered mental status.  Patient had a CT scan of the abdomen pelvis showed highly abnormal abdomen with severe complicated pancreatitis with multiple enlarging pancreatic pseudocyst with mass effect on the spleen, splenic infarct.   Clinical Impression  Pt received in Semi-Fowler's position and agreeable to therapy.  Pt noted to be in much better spirits upon entering stating that she was no longer in any pain.  Pt able to perform transfers without any significant help and then able to mobilize within the room without an AD.  Pt notes that she would require a walker if ambulating in the hallway, however in the room she felt safe without AD.  Pt able tolerate standing for several minutes for MD to perform examination and without an LOB.  Pt then transferred back to the bed with all needs met.  Pt notes she is more fatigued and would like HHPT services in order to increase strength upon returning home.        If plan is discharge home, recommend the following: Assistance with cooking/housework;A little help with walking and/or transfers;A little help with bathing/dressing/bathroom;Assist for transportation;Help with stairs or ramp for entrance   Can travel by private vehicle        Equipment Recommendations None recommended by PT  Recommendations for Other Services       Functional Status Assessment Patient has had a recent decline in their functional status and demonstrates the ability to make significant improvements in function in a reasonable and predictable amount of time.     Precautions / Restrictions Precautions Precautions: Fall Restrictions Weight Bearing Restrictions: No       Mobility  Bed Mobility Overal bed mobility: Needs Assistance       Supine to sit: Supervision          Transfers Overall transfer level: Needs assistance Equipment used: None Transfers: Sit to/from Stand Sit to Stand: Contact guard assist   Step pivot transfers: Contact guard assist       General transfer comment: pt stood at bedside from elevated stretcher without any complication    Ambulation/Gait Ambulation/Gait assistance: Contact guard assist Gait Distance (Feet): 10 Feet Assistive device: None Gait Pattern/deviations: Decreased stride length Gait velocity: decreased     General Gait Details: pt able to ambulate within the ED room without any complication, but noted that she would feel better with a walker if mobilizing in the hallway.  Stairs            Wheelchair Mobility     Tilt Bed    Modified Rankin (Stroke Patients Only)       Balance Overall balance assessment: Needs assistance Sitting-balance support: No upper extremity supported, Feet unsupported Sitting balance-Leahy Scale: Good     Standing balance support: No upper extremity supported, During functional activity Standing balance-Leahy Scale: Good Standing balance comment: pt able to stand independently without any AD while MD listened to lungs.                             Pertinent Vitals/Pain Pain Assessment Pain Assessment: No/denies pain Pain Intervention(s): Monitored during session    Home Living Family/patient expects to be discharged to:: Private residence Living Arrangements:  Children Available Help at Discharge: Family;Available PRN/intermittently Type of Home: Mobile home Home Access: Ramped entrance       Home Layout: One level Home Equipment: Agricultural consultant (2 wheels);Wheelchair - Set designer (4 wheels) (Pt states w/c never arrived from prior admission) Additional Comments: lives with son who is there 24/7     Prior Function Prior Level of Function : Needs assist             Mobility Comments: Pt states she occasionally uses walker in home ADLs Comments: Pt reports being ind with ADLs, son assists with cooking/cleaning     Extremity/Trunk Assessment   Upper Extremity Assessment Upper Extremity Assessment: Overall WFL for tasks assessed    Lower Extremity Assessment Lower Extremity Assessment: Overall WFL for tasks assessed       Communication      Cognition Arousal: Alert Behavior During Therapy: [facility] for tasks assessed/performed                                            General Comments      Exercises     Assessment/Plan    PT Assessment Patient needs continued PT services  PT Problem List Decreased strength;Decreased balance;Decreased activity tolerance;Decreased mobility;Decreased cognition;Decreased knowledge of use of DME;Decreased safety awareness;Decreased knowledge of precautions       PT Treatment Interventions DME instruction;Gait training;Functional mobility training;Therapeutic activities;Therapeutic exercise;Balance training;Neuromuscular re-education;Cognitive remediation;Patient/family education;Wheelchair mobility training    PT Goals (Current goals can be found in the Care Plan section)  Acute Rehab PT Goals Patient Stated Goal: to go home PT Goal Formulation: With patient Time For Goal Achievement: 07/28/23 Potential to Achieve Goals: Fair    Frequency Min 1X/week     Co-evaluation               AM-PAC PT "6 Clicks" Mobility  Outcome Measure Help needed turning from your back to your side while in a flat bed without using bedrails?: A Little Help needed moving from lying on your back to sitting on the side of a flat bed without using bedrails?: A Little Help needed moving to and from a bed to a chair (including a wheelchair)?: A Little Help needed standing up from a chair using your arms (e.g., wheelchair or bedside  chair)?: A Little Help needed to walk in hospital room?: A Little Help needed climbing 3-5 steps with a railing? : A Little 6 Click Score: 18    End of Session Equipment Utilized During Treatment: Gait belt Activity Tolerance: Patient tolerated treatment well Patient left: in bed;with call bell/phone within reach Nurse Communication: Mobility status PT Visit Diagnosis: Muscle weakness (generalized) (M62.81);Unsteadiness on feet (R26.81);Other abnormalities of gait and mobility (R26.89) Pain - Right/Left:  (back/neck)    Time: 1030-1050 PT Time Calculation (min) (ACUTE ONLY): 20 min   Charges:   PT Evaluation $PT Eval Low Complexity: 1 Low PT Treatments $Therapeutic Activity: 8-22 mins PT General Charges $$ ACUTE PT VISIT: 1 Visit         Nolon Bussing, PT, DPT Physical Therapist - [facility]  07/14/23, 12:50 PM

## 2023-07-14 NOTE — ED Notes (Signed)
[facility] still [facility]

## 2023-07-15 ENCOUNTER — Emergency Department: Payer: Medicare HMO

## 2023-07-15 DIAGNOSIS — J189 Pneumonia, unspecified organism: Secondary | ICD-10-CM

## 2023-07-15 DIAGNOSIS — J9601 Acute respiratory failure with hypoxia: Secondary | ICD-10-CM

## 2023-07-15 LAB — LEVETIRACETAM LEVEL: Levetiracetam Lvl: 68.5 ug/mL — ABNORMAL HIGH (ref 10.0–40.0)

## 2023-07-15 MED ORDER — PHENYTOIN 50 MG PO CHEW
100.0000 mg | CHEWABLE_TABLET | Freq: Three times a day (TID) | ORAL | Status: DC
  Administered 2023-07-15: 100 mg via ORAL
  Filled 2023-07-15 (×2): qty 2

## 2023-07-15 MED ORDER — AMOXICILLIN-POT CLAVULANATE 875-125 MG PO TABS: 1.0000 | ORAL_TABLET | Freq: Two times a day (BID) | ORAL | 0 refills | Status: AC

## 2023-07-15 MED ORDER — NICOTINE 14 MG/24HR TD PT24: MEDICATED_PATCH | TRANSDERMAL | 0 refills | Status: DC

## 2023-07-15 MED ORDER — ALBUTEROL SULFATE HFA 108 (90 BASE) MCG/ACT IN AERS: 2.0000 | INHALATION_SPRAY | Freq: Four times a day (QID) | RESPIRATORY_TRACT | 0 refills | Status: DC | PRN

## 2023-07-15 MED ORDER — IOHEXOL 350 MG/ML SOLN
75.0000 mL | Freq: Once | INTRAVENOUS | Status: AC | PRN
  Administered 2023-07-15: 75 mL via INTRAVENOUS

## 2023-07-15 MED ORDER — AMOXICILLIN-POT CLAVULANATE 875-125 MG PO TABS
1.0000 | ORAL_TABLET | Freq: Once | ORAL | Status: AC
  Administered 2023-07-15: 1 via ORAL
  Filled 2023-07-15: qty 1

## 2023-07-15 NOTE — ED Notes (Signed)
Pt ready for CTA, CT notified.

## 2023-07-15 NOTE — ED Notes (Signed)
Oxisensor changed to L ear lobe, good pleth, correlating with monitor, resting on RA in bed for ~ 15 minutes, at rest RA SPO2 is 79%, returned to 4L [place]. EDP and hospitalist notified.

## 2023-07-15 NOTE — ED Notes (Signed)
IV team paged for CTA IV

## 2023-07-15 NOTE — ED Notes (Addendum)
Pt restless, anxious, "ready to go, pulled her IVs out. Family at [facility]. Waiting on O2 tank arrival. C/o pain and anxiety. Alert, NAD, removed self from monitor.

## 2023-07-15 NOTE — ED Notes (Signed)
Care manager to coordinate home O2

## 2023-07-15 NOTE — ED Notes (Addendum)
Hospitalist at [facility]. Pt ambulated on RA. Steady gait, tachycardic 110, SPO2 up to 98% on RA at rest point, while ambulating SPO2 on RA dropped to 86%. SPO2 improved to 98% on 2L on return ambulation back to stretcher.

## 2023-07-15 NOTE — ED Notes (Signed)
IV team at [facility]

## 2023-07-15 NOTE — ED Notes (Signed)
Back from CT, alert, NAD, calm, interactive, no changes.

## 2023-07-15 NOTE — ED Notes (Addendum)
Reached out to SW to see if HH and DME (O2) set up at home prior to d/c, pending response. Pt alert, NAD, calm, interactive, no changes. Family at [facility]. Will d/c when O2 at home.

## 2023-07-15 NOTE — Assessment & Plan Note (Addendum)
Pulse ox with ambulation today 86%.  Able to hold her sats on 2 L.  Patient set up for home oxygen 2 L 24/7.  Advise she cannot smoke with oxygen.  Will refer to pulmonary as outpatient.  Possibility of pneumonia seen on CT scan.  Patient not having respiratory symptoms.  Will give Augmentin for 5 days.

## 2023-07-15 NOTE — ED Notes (Signed)
SW at [facility]. Pending arrival of O2 tank to ED.

## 2023-07-15 NOTE — ED Notes (Signed)
called to [facility] for pt update/pt on waitlist/still no bed assignment at this time per rep Kayla .

## 2023-07-15 NOTE — ED Notes (Addendum)
Admitting MD at [facility]. Neb complete. Trialing room air.

## 2023-07-15 NOTE — ED Provider Notes (Signed)
-----------------------------------------   10:12 AM on 07/15/2023 -----------------------------------------   Blood pressure (!) 103/58, pulse 99, temperature 98.6 F (37 C), temperature source Oral, resp. rate (!) 25, height 5\' 7"  (1.702 m), weight 72.6 kg, SpO2 99%.  The patient is calm and cooperative at this time.  Hospitalist evaluated patient in the ED today.  Has significant improvement in abdominal pain symptoms and has been tolerating p.o. without issue.  Hospitalist has low suspicion for splenic infarct or complicated pancreatitis necessitating transfer at this time.  She did have reported hypoxia with ambulation at 1 point and was given DuoNeb with improvement in symptoms.  Plan is for ambulation trial with repeat oxygenation check on room air to see if she needs to go home with O2 or not.  Otherwise, patient can be discharged afterwards per hospitalist with no ongoing need for transfer.  Patient mildly hypoxic on room air to approximately 86%.  Able to ambulate without significant tachypnea.  CT was ordered to evaluate for additional causes of hypoxia.  No evidence of PE but some evidence of possible atypical pneumonia.  Case was discussed with the hospitalist who reassessed patient and think she otherwise looks very well with no overt tachypnea and no hypoxia at rest.  She does qualify for home O2, and he has help to get this set up.  He does not think she needs to be admitted at this time and is safe for discharge with oral antibiotics outpatient.  He has set up follow-up with pulmonology and her PCP given the new O2 requirement and given strict return precautions.  Patient is agreeable with this plan.  Claudell Kyle, MD   Janith Lima, MD 07/15/23 [phone]

## 2023-07-15 NOTE — ED Notes (Signed)
Hospitalist at [facility]

## 2023-07-15 NOTE — Progress Notes (Signed)
Progress Note   Patient: Abigail [name] [facility] WJX:[id number] DOB: 03-Mar-1966 DOA: 07/12/2023     0 DOS: the patient was seen and examined on 07/15/2023   Brief hospital course: 57 year old female past medical history of epilepsy, chronic pain syndrome on chronic opioid treatment, chronic pancreatic.  Presents to the ER with altered mental status.  Patient had a CT scan of the abdomen pelvis showed highly abnormal abdomen with severe complicated pancreatitis with multiple enlarging pancreatic pseudocyst with mass effect on the spleen, splenic infarct.  Patient awaiting transfer to tertiary care center.  11/23.  Patient feeling better with regards to her pain.  Patient awaiting transfer to tertiary care center.  Will start liquid diet. 11/24.  Patient has no pain since yesterday.  She recalled that she ate a casserole and ate a lot of it and this may not have agreed with her and had some severe pain.  Patient interested in potentially going home.  Patient dropped her pulse ox with ambulation. 11/25.  CT scan of the chest negative for pulmonary embolism.  Mosaic pattern in the lower lobes could represent atelectasis versus pneumonia versus acute hypersensitivity pneumonia or bronchiolitis obliterans, splenic infarct.  Complex lesion in the vicinity of the tail of the pancreas, splenic hilum, less Will distinct than on prior exams does have some fluid in the left splenic flexure which could be hematoma.  Thoracic compression fracture T5 age-indeterminate, compression fractures T7, T8 and T9 showing on prior scans.  Transitional care team to set up for home oxygen.  Prescribed Augmentin for 5 days.  Patient not having any shortness of breath or respiratory symptoms.  No further abdominal pain.  Assessment and Plan: * Abdominal pain Patient's pain went away and has not returned..  Patient states that she ate a casserole and this may have been the cause of her pain.  Normally does not eat as much as she  did.  Tolerating advance diet.  Acute respiratory failure with hypoxia (HCC) Pulse ox with ambulation today 86%.  Able to hold her sats on 2 L.  Patient set up for home oxygen 2 L 24/7.  Advise she cannot smoke with oxygen.  Will refer to pulmonary as outpatient.  Possibility of pneumonia seen on CT scan.  Patient not having respiratory symptoms.  Will give Augmentin for 5 days.  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  Patient given the name of a physician that does endoscopic ultrasounds at [facility] as outpatient.  Acute on chronic pancreatitis (HCC) Usually pancreatitis pain does not go away this quickly.  Unlikely acute on chronic pancreatitis.  Patient tolerating diet.  Splenic infarct Usually if somebody had a splenic infarct that still be having pain so I do not think that this is acute.  Hesitant on anticoagulation in a patient with these large pseudocysts and possible hematoma.  Acute metabolic encephalopathy Improved   GERD (gastroesophageal reflux disease) IV PPI therapy while here and can go to oral once discharge.  Seizure (HCC) Continue Neurontin, Vimpat, Keppra and Dilantin.  Tobacco use disorder Nicotine patch.         Subjective: Patient feels okay.  Does not complain of any shortness of breath or cough.  No further abdominal pain from when she came in.  Patient interested in going home.  Show desaturated yesterday with walking around on room air.  Will be set up for home oxygen today since she desaturated again with walking around.  Physical Exam: Vitals:   07/15/23 1510 07/15/23 1530 07/15/23 1540  07/15/23 1545  BP:  93/77    Pulse: (!) 113 (!) 112 (!) 108 (!) 107  Resp: (!) 27 (!) 22    Temp:      TempSrc:      SpO2: 94% (!) 88% 98% 92%  Weight:      Height:       Physical Exam HENT:     Head: Normocephalic.     Mouth/Throat:     Pharynx: No oropharyngeal exudate.  Eyes:     General: Lids are normal.     Conjunctiva/sclera: Conjunctivae normal.   Cardiovascular:     Rate and Rhythm: Normal rate and regular rhythm.     Heart sounds: Normal heart sounds, S1 normal and S2 normal.  Pulmonary:     Breath sounds: Examination of the right-lower field reveals decreased breath sounds. Examination of the left-lower field reveals decreased breath sounds. Decreased breath sounds present. No wheezing, rhonchi or rales.  Abdominal:     Palpations: Abdomen is soft.     Tenderness: There is no abdominal tenderness.  Musculoskeletal:     Right lower leg: No swelling.     Left lower leg: No swelling.  Skin:    General: Skin is warm.     Findings: No rash.  Neurological:     Mental Status: She is alert and oriented to person, place, and time.     Data Reviewed: CT scan reviewed above  Disposition: Once oxygen is delivered ER physician may be able to send home.  I wrote the home health orders and the oxygen DME order.  Planned Discharge Destination: Home with Home Health    Time spent: 35 minutes  Author: Alford Highland, MD 07/15/2023 4:24 PM  For on call review [email or web address].

## 2023-07-15 NOTE — TOC Initial Note (Signed)
Transition of Care [facility]) - Initial/Assessment Note    Patient Details  Name: Abigail [name] [facility] MRN: [id number] Date of Birth: 06/04/1966  Transition of Care [facility]) CM/SW Contact:    Marquita Palms, LCSW Phone Number: 07/15/2023, 3:24 PM  Clinical Narrative:                  CSW met with patient bedside to discuss Home health and physical therapy. CSW spoke with [facility] Adelina Mings), for PT and RN. Adelina Mings reported she will be in touch with patient to schedule time for Tuesday or Wednesday. CSW spoke with [employer] for DME, Marthann Schiller reported they will bring and educate patient on how to use. CSW notified MD concerning patients setup for [facility] and DME. No other needs at this time.       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   History of seizure 06/28/2023   Seizure (HCC) 06/28/2023   Metabolic alkalosis 06/28/2023   Epilepsy (HCC) 06/28/2023   Protein-calorie malnutrition, severe 12/21/2022   Giant cell arteritis (HCC) 12/19/2022   DNR (do not resuscitate) 12/19/2022   Acute colitis 12/18/2022   Status epilepticus (HCC) 12/18/2022   Hypokalemia 12/08/2022   Hypoglycemia 12/08/2022   Acute metabolic encephalopathy 12/07/2022   Acute hypoxic respiratory failure (HCC) 11/06/2022   Altered mental status 11/06/2022   Mouth pain 08/23/2022   Steroid-induced hyperglycemia 08/12/2022   Idiopathic acute pancreatitis without infection or necrosis 08/08/2022   Tobacco  use disorder 08/08/2022   Asthma 04/26/2020   Vertigo 04/26/2020   Drug-induced constipation 04/26/2020   Nausea 04/26/2020   Abdominal pain 11/12/2016   GERD (gastroesophageal reflux disease) 11/12/2016   Chronic prescription opiate use - thru [facility] in [place], [place] 04/29/2016   Long term current use of immunosuppressive drug 04/29/2016   Neuroma digital nerve 06/04/2013   Depression 02/16/2013   Anxiety 02/13/2013   Chronic pain 02/13/2013   DDD (degenerative disc disease), cervical 02/13/2013   Headache 02/13/2013   Rheumatoid arthritis (HCC) 02/13/2013   PCP:  Alease Medina, MD Pharmacy:   [facility] [address], [place] - [address] [place] [address] Phone: [phone] Fax: [phone]  [facility] [phone] [place], [place] - [address] [place] [address] Phone: [phone] Fax: [phone]  [id number] - [place], [place] - [address] [place] [address] Phone: [phone] Fax: [phone]  [facility] [address] [place] [address] Phone: [phone] Fax:  [phone]     Social Determinants of Health (SDOH) Social History: SDOH Screenings   Food Insecurity: Food Insecurity Present (07/02/2023)  Housing: Patient Unable To Answer (07/02/2023)  Transportation Needs: Patient Unable To Answer (07/02/2023)  Utilities: Patient Unable To Answer (07/02/2023)  Tobacco Use: High Risk (06/28/2023)   SDOH Interventions:     Readmission Risk Interventions     No data to display

## 2023-07-16 LAB — LACOSAMIDE: Lacosamide: 15.4 ug/mL — ABNORMAL HIGH (ref 5.0–10.0)

## 2023-07-29 ENCOUNTER — Encounter: Payer: Self-pay | Admitting: Emergency Medicine

## 2023-07-29 ENCOUNTER — Emergency Department: Payer: Medicare HMO

## 2023-07-29 ENCOUNTER — Inpatient Hospital Stay
Admission: EM | Admit: 2023-07-29 | Discharge: 2023-07-31 | DRG: 439 | Disposition: A | Discharge status: Home Health | Payer: Medicare HMO | Attending: Internal Medicine | Admitting: Internal Medicine

## 2023-07-29 ENCOUNTER — Other Ambulatory Visit: Payer: Self-pay

## 2023-07-29 DIAGNOSIS — G8929 Other chronic pain: Secondary | ICD-10-CM | POA: Diagnosis present

## 2023-07-29 DIAGNOSIS — Z888 Allergy status to other drugs, medicaments and biological substances status: Secondary | ICD-10-CM

## 2023-07-29 DIAGNOSIS — J9611 Chronic respiratory failure with hypoxia: Secondary | ICD-10-CM | POA: Diagnosis not present

## 2023-07-29 DIAGNOSIS — K7460 Unspecified cirrhosis of liver: Secondary | ICD-10-CM | POA: Diagnosis present

## 2023-07-29 DIAGNOSIS — Z91048 Other nonmedicinal substance allergy status: Secondary | ICD-10-CM

## 2023-07-29 DIAGNOSIS — M797 Fibromyalgia: Secondary | ICD-10-CM | POA: Diagnosis present

## 2023-07-29 DIAGNOSIS — K859 Acute pancreatitis without necrosis or infection, unspecified: Principal | ICD-10-CM | POA: Diagnosis present

## 2023-07-29 DIAGNOSIS — Z90710 Acquired absence of both cervix and uterus: Secondary | ICD-10-CM

## 2023-07-29 DIAGNOSIS — J45909 Unspecified asthma, uncomplicated: Secondary | ICD-10-CM

## 2023-07-29 DIAGNOSIS — G40909 Epilepsy, unspecified, not intractable, without status epilepticus: Secondary | ICD-10-CM | POA: Diagnosis not present

## 2023-07-29 DIAGNOSIS — Z720 Tobacco use: Secondary | ICD-10-CM | POA: Diagnosis not present

## 2023-07-29 DIAGNOSIS — E785 Hyperlipidemia, unspecified: Secondary | ICD-10-CM | POA: Diagnosis present

## 2023-07-29 DIAGNOSIS — Z885 Allergy status to narcotic agent status: Secondary | ICD-10-CM

## 2023-07-29 DIAGNOSIS — E876 Hypokalemia: Secondary | ICD-10-CM | POA: Diagnosis present

## 2023-07-29 DIAGNOSIS — K863 Pseudocyst of pancreas: Secondary | ICD-10-CM | POA: Diagnosis present

## 2023-07-29 DIAGNOSIS — K861 Other chronic pancreatitis: Secondary | ICD-10-CM | POA: Diagnosis not present

## 2023-07-29 DIAGNOSIS — F17200 Nicotine dependence, unspecified, uncomplicated: Secondary | ICD-10-CM | POA: Diagnosis present

## 2023-07-29 DIAGNOSIS — B370 Candidal stomatitis: Secondary | ICD-10-CM | POA: Diagnosis present

## 2023-07-29 DIAGNOSIS — L304 Erythema intertrigo: Secondary | ICD-10-CM | POA: Diagnosis present

## 2023-07-29 DIAGNOSIS — J4489 Other specified chronic obstructive pulmonary disease: Secondary | ICD-10-CM | POA: Diagnosis present

## 2023-07-29 DIAGNOSIS — R569 Unspecified convulsions: Secondary | ICD-10-CM

## 2023-07-29 DIAGNOSIS — Z882 Allergy status to sulfonamides status: Secondary | ICD-10-CM

## 2023-07-29 DIAGNOSIS — Z91040 Latex allergy status: Secondary | ICD-10-CM

## 2023-07-29 DIAGNOSIS — Z886 Allergy status to analgesic agent status: Secondary | ICD-10-CM

## 2023-07-29 DIAGNOSIS — R6510 Systemic inflammatory response syndrome (SIRS) of non-infectious origin without acute organ dysfunction: Secondary | ICD-10-CM | POA: Insufficient documentation

## 2023-07-29 DIAGNOSIS — Z881 Allergy status to other antibiotic agents status: Secondary | ICD-10-CM

## 2023-07-29 DIAGNOSIS — F17210 Nicotine dependence, cigarettes, uncomplicated: Secondary | ICD-10-CM | POA: Diagnosis present

## 2023-07-29 DIAGNOSIS — F1120 Opioid dependence, uncomplicated: Secondary | ICD-10-CM | POA: Diagnosis present

## 2023-07-29 DIAGNOSIS — Z85828 Personal history of other malignant neoplasm of skin: Secondary | ICD-10-CM

## 2023-07-29 LAB — CBC WITH DIFFERENTIAL/PLATELET
Abs Immature Granulocytes: 0.06 10*3/uL (ref 0.00–0.07)
Basophils Absolute: 0.1 10*3/uL (ref 0.0–0.1)
Basophils Relative: 0 %
Eosinophils Absolute: 0.1 10*3/uL (ref 0.0–0.5)
Eosinophils Relative: 0 %
HCT: 40.1 % (ref 36.0–46.0)
Hemoglobin: 13.1 g/dL (ref 12.0–15.0)
Immature Granulocytes: 0 %
Lymphocytes Relative: 10 %
Lymphs Abs: 1.5 10*3/uL (ref 0.7–4.0)
MCH: 29.2 pg (ref 26.0–34.0)
MCHC: 32.7 g/dL (ref 30.0–36.0)
MCV: 89.5 fL (ref 80.0–100.0)
Monocytes Absolute: 0.8 10*3/uL (ref 0.1–1.0)
Monocytes Relative: 6 %
Neutro Abs: 11.9 10*3/uL — ABNORMAL HIGH (ref 1.7–7.7)
Neutrophils Relative %: 84 %
Platelets: 435 10*3/uL — ABNORMAL HIGH (ref 150–400)
RBC: 4.48 MIL/uL (ref 3.87–5.11)
RDW: 15.2 % (ref 11.5–15.5)
WBC: 14.4 10*3/uL — ABNORMAL HIGH (ref 4.0–10.5)
nRBC: 0 % (ref 0.0–0.2)

## 2023-07-29 LAB — COMPREHENSIVE METABOLIC PANEL
ALT: 19 U/L (ref 0–44)
AST: 56 U/L — ABNORMAL HIGH (ref 15–41)
Albumin: 2.4 g/dL — ABNORMAL LOW (ref 3.5–5.0)
Alkaline Phosphatase: 170 U/L — ABNORMAL HIGH (ref 38–126)
Anion gap: 12 (ref 5–15)
BUN: <5 mg/dL — ABNORMAL LOW (ref 6–20)
CO2: 30 mmol/L (ref 22–32)
Calcium: 8.3 mg/dL — ABNORMAL LOW (ref 8.9–10.3)
Chloride: 95 mmol/L — ABNORMAL LOW (ref 98–111)
Creatinine, Ser: 0.46 mg/dL (ref 0.44–1.00)
GFR, Estimated: >60 mL/min (ref >60)
Glucose, Bld: 129 mg/dL — ABNORMAL HIGH (ref 70–99)
Potassium: 3.1 mmol/L — ABNORMAL LOW (ref 3.5–5.1)
Sodium: 137 mmol/L (ref 135–145)
Total Bilirubin: 0.7 mg/dL (ref <1.2)
Total Protein: 7.5 g/dL (ref 6.5–8.1)

## 2023-07-29 LAB — PROCALCITONIN: Procalcitonin: <0.1 ng/mL

## 2023-07-29 LAB — LIPASE, BLOOD: Lipase: 88 U/L — ABNORMAL HIGH (ref 11–51)

## 2023-07-29 LAB — LACTIC ACID, PLASMA: Lactic Acid, Venous: 2 mmol/L (ref 0.5–1.9)

## 2023-07-29 LAB — PROTIME-INR
INR: 1.2 (ref 0.8–1.2)
Prothrombin Time: 15.1 s (ref 11.4–15.2)

## 2023-07-29 LAB — APTT: aPTT: 37 s — ABNORMAL HIGH (ref 24–36)

## 2023-07-29 MED ORDER — SODIUM CHLORIDE 0.9 % IV BOLUS
1000.0000 mL | Freq: Once | INTRAVENOUS | Status: AC
  Administered 2023-07-29: 1000 mL via INTRAVENOUS

## 2023-07-29 MED ORDER — PIPERACILLIN-TAZOBACTAM 3.375 G IVPB
3.3750 g | Freq: Three times a day (TID) | INTRAVENOUS | Status: DC
  Administered 2023-07-29 – 2023-07-30 (×3): 3.375 g via INTRAVENOUS
  Filled 2023-07-29 (×3): qty 50

## 2023-07-29 MED ORDER — ALBUTEROL SULFATE (2.5 MG/3ML) 0.083% IN NEBU: 2.5000 mg | INHALATION_SOLUTION | Freq: Four times a day (QID) | RESPIRATORY_TRACT | Status: DC | PRN

## 2023-07-29 MED ORDER — PIPERACILLIN-TAZOBACTAM 3.375 G IVPB
3.3750 g | Freq: Three times a day (TID) | INTRAVENOUS | Status: DC
  Administered 2023-07-29: 3.375 g via INTRAVENOUS
  Filled 2023-07-29: qty 50

## 2023-07-29 MED ORDER — HYDROMORPHONE HCL 1 MG/ML IJ SOLN
1.5000 mg | Freq: Once | INTRAMUSCULAR | Status: AC
  Administered 2023-07-29: 1.5 mg via INTRAVENOUS
  Filled 2023-07-29: qty 1.5

## 2023-07-29 MED ORDER — HYDROMORPHONE HCL 1 MG/ML IJ SOLN: 1.5000 mg | INTRAMUSCULAR | Status: DC | PRN

## 2023-07-29 MED ORDER — LACTATED RINGERS IV SOLN: INTRAVENOUS | Status: DC

## 2023-07-29 MED ORDER — MORPHINE SULFATE (PF) 4 MG/ML IV SOLN
4.0000 mg | Freq: Once | INTRAVENOUS | Status: AC
  Administered 2023-07-29: 4 mg via INTRAVENOUS
  Filled 2023-07-29: qty 1

## 2023-07-29 MED ORDER — IOHEXOL 300 MG/ML  SOLN
100.0000 mL | Freq: Once | INTRAMUSCULAR | Status: AC | PRN
  Administered 2023-07-29: 100 mL via INTRAVENOUS

## 2023-07-29 MED ORDER — ENOXAPARIN SODIUM 40 MG/0.4ML IJ SOSY
40.0000 mg | PREFILLED_SYRINGE | INTRAMUSCULAR | Status: DC
Start: 2023-07-29 — End: 2023-07-31
  Administered 2023-07-29 – 2023-07-31 (×3): 40 mg via SUBCUTANEOUS
  Filled 2023-07-29 (×3): qty 0.4

## 2023-07-29 MED ORDER — HYDROMORPHONE HCL 1 MG/ML IJ SOLN
1.0000 mg | INTRAMUSCULAR | Status: DC | PRN
  Administered 2023-07-29 – 2023-07-31 (×11): 1 mg via INTRAVENOUS
  Filled 2023-07-29 (×11): qty 1

## 2023-07-29 MED ORDER — NICOTINE 21 MG/24HR TD PT24
21.0000 mg | MEDICATED_PATCH | Freq: Every day | TRANSDERMAL | Status: DC
  Administered 2023-07-29 – 2023-07-31 (×3): 21 mg via TRANSDERMAL
  Filled 2023-07-29 (×3): qty 1

## 2023-07-29 MED ORDER — ONDANSETRON HCL 4 MG/2ML IJ SOLN
4.0000 mg | INTRAMUSCULAR | Status: AC
  Administered 2023-07-29: 4 mg via INTRAVENOUS
  Filled 2023-07-29: qty 2

## 2023-07-29 MED ORDER — LEVETIRACETAM IN NACL 1500 MG/100ML IV SOLN
1500.0000 mg | Freq: Two times a day (BID) | INTRAVENOUS | Status: DC
  Administered 2023-07-29 (×2): 1500 mg via INTRAVENOUS
  Filled 2023-07-29 (×3): qty 100

## 2023-07-29 MED ORDER — SODIUM CHLORIDE 0.9 % IV SOLN
200.0000 mg | Freq: Two times a day (BID) | INTRAVENOUS | Status: DC
  Administered 2023-07-29 (×2): 200 mg via INTRAVENOUS
  Filled 2023-07-29 (×4): qty 20

## 2023-07-29 MED ORDER — PHENYTOIN SODIUM 50 MG/ML IJ SOLN
100.0000 mg | Freq: Three times a day (TID) | INTRAMUSCULAR | Status: DC
  Administered 2023-07-29 – 2023-07-30 (×3): 100 mg via INTRAVENOUS
  Filled 2023-07-29 (×4): qty 2

## 2023-07-29 MED ORDER — PROMETHAZINE HCL 25 MG PO TABS: 12.5000 mg | ORAL_TABLET | Freq: Four times a day (QID) | ORAL | Status: DC | PRN

## 2023-07-29 MED ORDER — DROPERIDOL 2.5 MG/ML IJ SOLN
2.5000 mg | Freq: Once | INTRAMUSCULAR | Status: AC
  Administered 2023-07-29: 2.5 mg via INTRAVENOUS
  Filled 2023-07-29: qty 2

## 2023-07-29 NOTE — ED Notes (Signed)
Lab called to collect cultures and APTT

## 2023-07-29 NOTE — ED Notes (Signed)
Pt O2 titrated to 3L due to O2 in 80s.

## 2023-07-29 NOTE — Assessment & Plan Note (Signed)
Meeting SIRS criteria with heart rate 100s, white count of 14 Noted acute on chronic pancreatitis Lactate and procalcitonin pending Will panculture Aggressive LR IV fluid hydration Will empirically cover with Zosyn over the next 24 hours in setting of acute on chronic pancreatitis with elevated BISAP score De-escalate as appropriate Monitor

## 2023-07-29 NOTE — ED Notes (Signed)
Patient assisted to bedside commode. Fresh linens placed on bed. Clean gown on patient. Extra pillow and warm blankets provided. No other needs at this time.

## 2023-07-29 NOTE — Consult Note (Signed)
Pharmacy Antibiotic Note  ASSESSMENT: 57 y.o. female with PMH including pancreatitis is presenting with sepsis, concerning for possible intra-abdominal infection. CTAP shows peri-pancreatic stranding, consistent with acute pancreatitis. Pharmacy has been consulted to manage Zosyn dosing.  Patient measurements: Weight: 72.6 kg (160 lb)  Vital signs: Temp: 98.2 F (36.8 C) (12/09 0751) Temp Source: Oral (12/09 0751) BP: 150/108 (12/09 0700) Pulse Rate: 116 (12/09 0645) Recent Labs  Lab 07/29/23 0237 07/29/23 0323  WBC 14.4*  --   CREATININE  --  0.46   Estimated Creatinine Clearance: 74.5 mL/min (by C-G formula based on SCr of 0.46 mg/dL).  Allergies: Allergies  Allergen Reactions   Nsaids Hives   Tapentadol Swelling, Rash and Other (See Comments)    Nucynta- Made her deathly sick   Cephalexin Other (See Comments)    Reaction not cited   Codeine Other (See Comments)    Reaction not cited   Darvocet [Propoxyphene N-Acetaminophen] Other (See Comments)    Reaction not cited   Latex Other (See Comments)    Reaction not cited   Silicone Other (See Comments)    Reaction not cited   Sulfa Antibiotics Hives   Tape Other (See Comments)    Reaction not cited   Meloxicam Rash    Antimicrobials this admission: N/A  Dose adjustments this admission: N/A  Microbiology results: N/A  PLAN: Initiate Zosyn 3.375 g IV q8H Follow up culture results to assess for antibiotic optimization. Monitor renal function to assess for any necessary antibiotic dosing changes.   Thank you for allowing pharmacy to be a part of this patient's care.  Will M. Dareen Piano, PharmD Clinical Pharmacist 07/29/2023 10:00 AM

## 2023-07-29 NOTE — ED Provider Notes (Signed)
[facility] Provider Note    Event Date/Time   First MD Initiated Contact with Patient 07/29/23 0421     (approximate)   History   Abdominal Pain   HPI Abigail [name] is a 57 y.o. female with a history of complicated idiopathic pancreatitis with pseudocyst.  She also has opioid dependence due to chronic orthopedic issues.  She presents for evaluation of worsening abdominal pain and nausea/vomiting.  She said she is just very frustrated because no one could fix her problem and she has not felt well for a long time.  She said that if she can get some pain relief and get if you are asleep she think she will feel a lot better.  Nothing in particular seems to make her symptoms better or worse.  She has not had a recent fever, chest pain, nor shortness of breath.  Her abdominal pain is primarily in the upper mid abdomen but radiates throughout.     Physical Exam   Triage Vital Signs: ED Triage Vitals  Encounter Vitals Group     BP 07/29/23 0232 (!) 147/77     Systolic BP Percentile --      Diastolic BP Percentile --      Pulse Rate 07/29/23 0232 (!) 109     Resp 07/29/23 0232 18     Temp 07/29/23 0321 97.6 F (36.4 C)     Temp Source 07/29/23 0321 Oral     SpO2 07/29/23 0232 97 %     Weight 07/29/23 0230 72.6 kg (160 lb)     Height --      Head Circumference --      Peak Flow --      Pain Score 07/29/23 0230 10     Pain Loc --      Pain Education --      Exclude from Growth Chart --     Most recent vital signs: Vitals:   07/29/23 0645 07/29/23 0700  BP:  (!) 150/108  Pulse: (!) 116   Resp:    Temp:    SpO2: 99%     General: Awake, appears chronically ill and in pain at this time, but nontoxic. CV:  Good peripheral perfusion.  Mild tachycardia. Resp:  Normal effort. Speaking easily and comfortably, no accessory muscle usage nor intercostal retractions.  Lungs clear to auscultation. Abd:  No distention.  Tender to palpation in the  upper abdomen with guarding.  No lower abdominal tenderness.   ED Results / Procedures / Treatments   Labs (all labs ordered are listed, but only abnormal results are displayed) Labs Reviewed  CBC WITH DIFFERENTIAL/PLATELET - Abnormal; Notable for the following components:      Result Value   WBC 14.4 (*)    Platelets 435 (*)    Neutro Abs 11.9 (*)    All other components within normal limits  COMPREHENSIVE METABOLIC PANEL - Abnormal; Notable for the following components:   Potassium 3.1 (*)    Chloride 95 (*)    Glucose, Bld 129 (*)    BUN <5 (*)    Calcium 8.3 (*)    Albumin 2.4 (*)    AST 56 (*)    Alkaline Phosphatase 170 (*)    All other components within normal limits  LIPASE, BLOOD - Abnormal; Notable for the following components:   Lipase 88 (*)    All other components within normal limits  URINALYSIS, COMPLETE (UACMP) WITH MICROSCOPIC  RADIOLOGY CT of the abdomen and pelvis ordered at time of transfer of care   PROCEDURES:  Critical Care performed: No  Procedures    IMPRESSION / MDM / ASSESSMENT AND PLAN / ED COURSE  I reviewed the triage vital signs and the nursing notes.                              Differential diagnosis includes, but is not limited to, acute on chronic pancreatitis, biliary obstruction, exacerbation of chronic pain, SBO/ileus.  Patient's presentation is most consistent with acute presentation with potential threat to life or bodily function.  Labs/studies ordered: Lipase, CMP, CBC with differential  Interventions/Medications given:  Medications  morphine (PF) 4 MG/ML injection 4 mg (4 mg Intravenous Given 07/29/23 0427)  ondansetron (ZOFRAN) injection 4 mg (4 mg Intravenous Given 07/29/23 0427)  sodium chloride 0.9 % bolus 1,000 mL (0 mLs Intravenous Stopped 07/29/23 0532)  droperidol (INAPSINE) 2.5 MG/ML injection 2.5 mg (2.5 mg Intravenous Given 07/29/23 0704)  sodium chloride 0.9 % bolus 1,000 mL (1,000 mLs Intravenous New  Bag/Given 07/29/23 0702)  morphine (PF) 4 MG/ML injection 4 mg (4 mg Intravenous Given 07/29/23 0702)  iohexol (OMNIPAQUE) 300 MG/ML solution 100 mL (100 mLs Intravenous Contrast Given 07/29/23 0710)    (Note:  [name] course my include additional interventions and/or labs/studies not listed above.)   Patient is mildly tachycardic, likely due to both volume depletion and pain.  I ordered morphine 4 mg IV and Zofran 4 mg IV as well as 1 L normal saline.  This had some effect but did not substantially change either her discomfort or her tachycardia.  When I spoke with her about her symptoms and current presentation.  She made the comment that she wishes she could just get some pain relief and get if you are asleep and she thinks she might feel better.  I verified in the [place] controlled substance database that she gets chronic opioids as result of all of her ongoing orthopedic issues, but this does not seem to be helping her current issue.  I read the ED notes associated with the patient's last ED visit which was about 3 weeks ago.  At that time, given the complicated findings on her CT scan, it was recommended that she be transferred to a higher level of care to a facility that could manage complicated pancreatitis with pseudocyst, etc.  However, no beds were available, and after several days she was evaluated by the hospitalist service and everyone agreed that she was appropriate for discharge home.  She said that she has not followed up with anyone since that time.  Given her ongoing issues, and the concern that her pancreatitis and pseudocyst may be getting worse, I ordered a CT of the abdomen and pelvis with IV contrast to compare from study about 3 weeks ago.  I ordered another dose of morphine 4 mg IV as well as droperidol 2.5 mg IV which should be a good adjuvant medication and help with the nausea.  I ordered a second liter of normal saline.  If the patient's symptoms are well-controlled,  she may be appropriate for discharge.  I am transferring ED care to Dr. Modesto Charon to reassess the patient clinically as well as to review the CT scan to determine the appropriate disposition.  The patient is on the cardiac monitor to evaluate for evidence of arrhythmia and/or significant heart rate changes.   Clinical Course  as of 07/29/23 0728  Mon Jul 29, 2023  0715 Transferring ED care to Dr. Modesto Charon to follow-up on imaging and reassess.  [CF]    Clinical Course User Index [CF] Abigail Rose, MD     FINAL CLINICAL IMPRESSION(S) / ED DIAGNOSES   Final diagnoses:  Idiopathic chronic pancreatitis (HCC)     Rx / DC Orders   ED Discharge Orders     None        Note:  This document was prepared using Dragon voice recognition software and may include unintentional dictation errors.   Abigail Rose, MD 07/29/23 [phone]

## 2023-07-29 NOTE — Assessment & Plan Note (Signed)
Baseline seizure disorder with multiple recent admissions for the same Patient denies any recent seizure events Will continue home regimen including Keppra/Vimpat/phenytoin  IV equivalents the patient unable to tolerate p.o. Monitor closely

## 2023-07-29 NOTE — ED Provider Notes (Addendum)
Patient got moderate relief with the 4 mg morphine.  CT scan is completed and shows stable pseudocysts and evidence of pancreatitis.  I offered admission however the patient initially declined stating that she would like to go home.  Shortly thereafter she changed her mind and would like to stay for further management of her pain.  Hospitalist paged for admission.   Pilar Jarvis, MD 07/29/23 1610    Pilar Jarvis, MD 07/29/23 [phone]

## 2023-07-29 NOTE — ED Notes (Signed)
Abigail Morin, MD,  made aware of lactic 2.0

## 2023-07-29 NOTE — ED Notes (Signed)
Lab called to collect lactic acid

## 2023-07-29 NOTE — Assessment & Plan Note (Signed)
1-2 pack-a-day smoker Discussed cessation at bedside Nicotine patch Follow

## 2023-07-29 NOTE — ED Notes (Signed)
Patient placed on 2 L  d/t chronic use.

## 2023-07-29 NOTE — ED Triage Notes (Addendum)
Patient BIB [facility] from home c/o abdominal pain since 1400 yesterday.  Patient has history of pancreatitis. States this feels like a flare up.

## 2023-07-29 NOTE — Assessment & Plan Note (Signed)
Noted to be on oxycodone 15 mg 5 times a day for chronic pain Appears fairly lethargic at the bedside status post IV Dilaudid in the setting of acute on chronic pancreatitis Will need to titrate pain regimen and sedating medication so as to avoid excessive encephalopathy Monitor

## 2023-07-29 NOTE — H&P (Addendum)
History and Physical    Patient: Abigail [name] [facility] YQM:[id number] DOB: Dec 01, 1965 DOA: 07/29/2023 DOS: the patient was seen and examined on 07/29/2023 PCP: Alease Medina, MD  Patient coming from: Home  Chief Complaint:  Chief Complaint  Patient presents with   Abdominal Pain   HPI: [name] 57 y.o. female with medical history significant of seizure disorder/epilepsy, chronic pancreatitis, chronic pain, giant cell arteritis, chronic respiratory failure on 2 L, asthma/COPD presenting with acute on chronic pancreatitis.  Patient reports roughly 3 weeks of worsening periumbilical and left-sided abdominal pain.  Positive decreased p.o. intake.  No frank nausea or vomiting.  Has had fairly extensive history chronic pancreatitis.  Followed by [facility] gastroenterology.  Noted prior history of heavy alcohol use which was felt to be contributory.  Patient denies any recent alcohol intake in several months to years.  1 to 2 pack/day smoker.  Does believe there may be some possible dietary triggers to which she is not sure.  No focal hemiparesis or confusion.  No reported seizure activity. Presented to the ER afebrile, heart rate 100s, BP stable.  Satting well on room air.  White count 14.4, hemoglobin 13.1, platelets 435, creatinine 0.46, sodium 129, alk phos 170.  Lipase 88.  T. bili 0.7.  AST 56, ALT 19.  CT abdomen pelvis with acute pancreatitis with stable cirrhosis. Review of Systems: As mentioned in the history of present illness. All other systems reviewed and are negative. Past Medical History:  Diagnosis Date   Anxiety    Arthritis    Back pain    Basal cell carcinoma 10/04/2021   R axilla - needs excised 11/28/21   Basal cell carcinoma 10/04/2021   L antecubital excised 11/14/21   Diarrhea 11/12/2016   Fibromyalgia    Generalized abdominal pain 11/12/2016   H. pylori infection    Hyperlipidemia    IBS (irritable bowel syndrome)    Infectious colitis 04/29/2016    Migraines    Moderate dehydration 04/29/2016   Muscle pain    Opioid overdose (HCC) 12/07/2022   Reflux    Unexplained weight loss 11/12/2016   Past Surgical History:  Procedure Laterality Date   ABDOMINAL HYSTERECTOMY     APPENDECTOMY  2009   C5 FUSION     C6 FUSION     C7 FUSION     COLONOSCOPY  02/2006   COLONOSCOPY WITH PROPOFOL N/A 12/25/2016   Procedure: COLONOSCOPY WITH PROPOFOL;  Surgeon: Earline Mayotte, MD;  Location: [facility] ENDOSCOPY;  Service: Endoscopy;  Laterality: N/A;   ESOPHAGOGASTRODUODENOSCOPY (EGD) WITH PROPOFOL N/A 12/25/2016   Procedure: ESOPHAGOGASTRODUODENOSCOPY (EGD) WITH PROPOFOL;  Surgeon: Earline Mayotte, MD;  Location: [facility] ENDOSCOPY;  Service: Endoscopy;  Laterality: N/A;   FOOT SURGERY     HIP ARTHROPLASTY     L4 FUSION     L5 FUSION     S1 FUSION     Social History:  reports that she has been smoking cigarettes. She has never used smokeless tobacco. She reports current alcohol use. She reports that she does not use drugs.  Allergies  Allergen Reactions   Nsaids Hives   Tapentadol Swelling, Rash and Other (See Comments)    Nucynta- Made her deathly sick   Cephalexin Other (See Comments)    Reaction not cited   Codeine Other (See Comments)    Reaction not cited   Darvocet [Propoxyphene N-Acetaminophen] Other (See Comments)    Reaction not cited   Latex Other (See  Comments)    Reaction not cited   Silicone Other (See Comments)    Reaction not cited   Sulfa Antibiotics Hives   Tape Other (See Comments)    Reaction not cited   Meloxicam Rash    History reviewed. No pertinent family history.  Prior to Admission medications   Medication Sig Start Date End Date Taking? Authorizing Provider  albuterol (VENTOLIN HFA) 108 (90 Base) MCG/ACT inhaler Inhale 2 puffs into the lungs every 6 (six) hours as needed for wheezing or shortness of breath. 07/15/23   Alford Highland, MD  Cholecalciferol (VITAMIN D3) 50 MCG (2000 UT) TABS Take 50 mcg by  mouth daily.    [provider]  diazePAM, 15 MG Dose, (VALTOCO 15 MG DOSE) 2 x 7.5 MG/0.1ML LQPK Place 15 mg into the nose daily as needed (Intranasal Valtoco 15 mg for seizure lasting more than 2 minutes). 07/04/23   Ghimire, Werner Lean, MD  folic acid (FOLVITE) 1 MG tablet Take 1 mg by mouth daily.    [provider]  gabapentin (NEURONTIN) 300 MG capsule Take 300 mg by mouth 4 (four) times daily. Take 2 capsules by mouth in the morning, 1 capsule at noon, 2 capsules in the evening 05/31/23   [provider]  hydrOXYzine (ATARAX) 25 MG tablet Take 1 tablet (25 mg total) by mouth 3 (three) times daily as needed for itching or anxiety. 12/25/22   Alwyn Ren, MD  lacosamide (VIMPAT) 200 MG TABS tablet Take 1 tablet (200 mg total) by mouth 2 (two) times daily. 07/04/23   Ghimire, Werner Lean, MD  levETIRAcetam (KEPPRA) 750 MG tablet Take 2 tablets (1,500 mg total) by mouth 2 (two) times daily. 07/04/23   Ghimire, Werner Lean, MD  Magnesium 250 MG TABS Take 250 mg by mouth at bedtime.    [provider]  Melatonin 10 MG TABS Take 10 mg by mouth at bedtime.    [provider]  Multiple Vitamin (MULTIVITAMIN WITH MINERALS) TABS tablet Take 1 tablet by mouth daily. 12/26/22   Kathlen Mody, MD  nicotine (NICODERM CQ - DOSED IN MG/24 HOURS) 14 mg/24hr patch One patch chest wall daily (okay to substitute generic) 07/15/23   Alford Highland, MD  oxyCODONE (ROXICODONE) 15 MG immediate release tablet Take 15 mg by mouth 5 (five) times daily as needed for pain. 05/09/23   [provider]  pantoprazole (PROTONIX) 40 MG tablet Take 40 mg by mouth 2 (two) times daily before a meal.    [provider]  phenytoin (DILANTIN) 50 MG tablet Chew 2 tablets (100 mg total) by mouth 3 (three) times daily. 07/04/23   Ghimire, Werner Lean, MD  thiamine (VITAMIN B1) 100 MG tablet Take 1 tablet (100 mg total) by mouth daily. 12/25/22   Kathlen Mody, MD  vitamin B-12  (CYANOCOBALAMIN) 500 MCG tablet Take 500 mcg by mouth daily.    [provider]    Physical Exam: Vitals:   07/29/23 0630 07/29/23 0645 07/29/23 0700 07/29/23 0751  BP: (!) 120/102  (!) 150/108   Pulse: (!) 110 (!) 116    Resp:      Temp:    98.2 F (36.8 C)  TempSrc:    Oral  SpO2: 96% 99%    Weight:       Physical Exam Constitutional:      Appearance: She is normal weight.     Comments: Mild sedation on evaluation    HENT:     Head: Normocephalic  and atraumatic.     Mouth/Throat:     Mouth: Mucous membranes are moist.  Eyes:     Pupils: Pupils are equal, round, and reactive to light.  Cardiovascular:     Rate and Rhythm: Normal rate and regular rhythm.  Pulmonary:     Effort: Pulmonary effort is normal.  Abdominal:     General: Bowel sounds are normal.     Comments: + periumbilical TTP    Musculoskeletal:        General: Normal range of motion.  Neurological:     General: No focal deficit present.  Psychiatric:        Mood and Affect: Mood normal.     Data Reviewed:  There are no new results to review at this time.  CT ABDOMEN PELVIS W CONTRAST CLINICAL DATA:  57 year old female with history of severe acute pancreatitis.  EXAM: CT ABDOMEN AND PELVIS WITH CONTRAST  TECHNIQUE: Multidetector CT imaging of the abdomen and pelvis was performed using the standard protocol following bolus administration of intravenous contrast.  RADIATION DOSE REDUCTION: This exam was performed according to the departmental dose-optimization program which includes automated exposure control, adjustment of the mA and/or kV according to patient size and/or use of iterative reconstruction technique.  CONTRAST:  OMNIPAQUE IOHEXOL 300 MG/ML  SOLN  COMPARISON:  CT of the abdomen and pelvis 07/12/2023.  FINDINGS: Lower chest: Bibasilar areas of atelectasis and/or scarring are noted, most severe in the left lower lobe.  Hepatobiliary: No suspicious cystic or  solid hepatic lesions. No intra or extrahepatic biliary ductal dilatation. Gallbladder is moderately distended. Some intermediate attenuation material lies dependently in the gallbladder, which may reflect biliary sludge. No definite calcified gallstones are noted. Trace amount of pericholecystic fluid.  Pancreas: Multiple low-attenuation lesions scattered throughout the pancreatic parenchyma, likely multiple pseudocysts, largest of which is in the pancreatic head (axial image 37 of series 2 and coronal image 38 of series 5) measuring 6.2 x 5.2 x 5.6 cm. Another low-attenuation lesion is noted in the gastrosplenic ligament adjacent to the tail of the pancreas (axial image 18 of series 2 and coronal image 52 of series 5) measuring 4.2 x 3.0 x 5.7 cm. Small amount of soft tissue stranding surrounding the pancreas, particularly adjacent to the head and uncinate process.  Spleen: Linear perfusion deficit in the superior aspect of the spleen, compatible with a resolving splenic infarct.  Adrenals/Urinary Tract: Bilateral kidneys and bilateral adrenal glands are normal in appearance. No hydroureteronephrosis. Urinary bladder is nearly decompressed and largely obscured by beam hardening artifact from the patient's right hip arthroplasty.  Stomach/Bowel: The appearance of the stomach is normal. There is no pathologic dilatation of small bowel or colon. Status post appendectomy.  Vascular/Lymphatic: Atherosclerotic calcifications in the abdominal aorta and pelvic vasculature. No lymphadenopathy noted in the abdomen or pelvis.  Reproductive: Status post hysterectomy. Ovaries are not confidently identified may be surgically absent or atrophic.  Other: Small volume of ascites. Extensive inflammation centered in the left upper quadrant of the abdomen inferior to the spleen and lateral to the stomach where there are multiple poorly defined low-attenuation fluid collections, some of which  demonstrates some peripheral rim enhancement (example on axial image 33 of series 2 measuring up to 4.7 x 1.8 cm). Some of this fluid and inflammation is now tracking in the left pericolic gutter, including a small collection measuring 2.4 x 1.2 cm (axial image 43 of series 2), increased compared to the prior study.  Musculoskeletal: Postoperative changes  of lower lumbar spine and lumbosacral fusion. Chronic appearing compression fractures of T12 and L1 with up to 40% loss of anterior vertebral body height at T12. Multiple thoracic vertebral body compression fractures, similar to the prior study. There are no aggressive appearing lytic or blastic lesions noted in the visualized portions of the skeleton.  IMPRESSION: 1. Soft tissue stranding surrounding the pancreas, compatible with reported clinical history of acute pancreatitis. 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 Multiple poorly defined pancreatic pseudocysts and inflammation also centered in the omentum of the left upper quadrant, similar to the prior study, although there is some extension of this inflammation caudally in the left pericolic gutter, as discussed above. 2. Resolving splenic infarct again noted. 3. Aortic atherosclerosis. 4. Additional incidental findings, as above.  Electronically Signed   By: Trudie Reed M.D.   On: 07/29/2023 07:47  Lab Results  Component Value Date   WBC 14.4 (H) 07/29/2023   HGB 13.1 07/29/2023   HCT 40.1 07/29/2023   MCV 89.5 07/29/2023   PLT 435 (H) 07/29/2023   Last metabolic panel Lab Results  Component Value Date   GLUCOSE 129 (H) 07/29/2023   NA 137 07/29/2023   K 3.1 (L) 07/29/2023   CL 95 (L) 07/29/2023   CO2 30 07/29/2023   BUN <5 (L) 07/29/2023   CREATININE 0.46 07/29/2023   GFRNONAA >60 07/29/2023   CALCIUM 8.3 (L)  07/29/2023   PHOS 1.5 (L) 07/02/2023   PROT 7.5 07/29/2023   ALBUMIN 2.4 (L) 07/29/2023   BILITOT 0.7 07/29/2023   ALKPHOS 170 (H) 07/29/2023   AST 56 (H) 07/29/2023   ALT 19 07/29/2023   ANIONGAP 12 07/29/2023    Assessment and Plan: * Acute on chronic pancreatitis (HCC) Progressive periumbilical and left-sided abdominal pain x 3 weeks in the setting of chronic pancreatitis Lipase in the 80s today which appears near baseline CT imaging concerning for acute pancreatitis with relatively stable pancreatic pseudocyst BISAP score 3  Denies any alcohol use. Noted regular tobacco use which may be confounding issue Followed by [facility] gastroenterology Pain control IV fluids and antiemetics Consider GI consultation if symptoms fail to improve Monitor  SIRS (systemic inflammatory response syndrome) (HCC) Meeting SIRS criteria with heart rate 100s, white count of 14 Noted acute on chronic pancreatitis Lactate and procalcitonin pending Will panculture Aggressive LR IV fluid hydration Will empirically cover with Zosyn over the next 24 hours in setting of acute on chronic pancreatitis with elevated BISAP score De-escalate as appropriate Monitor  Chronic respiratory failure with hypoxia (HCC) Baseline 2 L nasal cannula use in setting of heavy tobacco use and likely undiagnosed COPD Appears fairly stable from a respiratory standpoint Continue supplemental oxygen Monitor  Seizure (HCC) Baseline seizure disorder with multiple recent admissions for the same Patient denies any recent seizure events Will continue home regimen including Keppra/Vimpat/phenytoin  IV equivalents the patient unable to tolerate p.o. Monitor closely  Tobacco use disorder 1-2 pack-a-day smoker Discussed cessation at bedside Nicotine patch Follow  Asthma Stable from a respiratory standpoint Continue home inhalers  Chronic pain Noted to be on oxycodone 15 mg 5 times a day for chronic pain Appears fairly  lethargic at the bedside status post IV Dilaudid in the setting of acute on chronic pancreatitis Will need to titrate pain regimen and sedating medication so as to avoid excessive encephalopathy  Monitor      Advance Care Planning:   Code Status: Full Code   Consults: None at present   Family Communication: No family at the bedside   Severity of Illness: The appropriate patient status for this patient is OBSERVATION. Observation status is judged to be reasonable and necessary in order to provide the required intensity of service to ensure the patient's safety. The patient's presenting symptoms, physical exam findings, and initial radiographic and laboratory data in the context of their medical condition is felt to place them at decreased risk for further clinical deterioration. Furthermore, it is anticipated that the patient will be medically stable for discharge from the hospital within 2 midnights of admission.   Author: Floydene Flock, MD 07/29/2023 9:18 AM  For on call review [email or web address].

## 2023-07-29 NOTE — Assessment & Plan Note (Signed)
Baseline 2 L nasal cannula use in setting of heavy tobacco use and likely undiagnosed COPD Appears fairly stable from a respiratory standpoint Continue supplemental oxygen Monitor

## 2023-07-29 NOTE — Assessment & Plan Note (Addendum)
Progressive periumbilical and left-sided abdominal pain x 3 weeks in the setting of chronic pancreatitis Lipase in the 80s today which appears near baseline CT imaging concerning for acute pancreatitis with relatively stable pancreatic pseudocyst BISAP score 3  Denies any alcohol use. Noted regular tobacco use which may be confounding issue Followed by [facility] gastroenterology Pain control IV fluids and antiemetics Consider GI consultation if symptoms fail to improve Monitor

## 2023-07-29 NOTE — Discharge Instructions (Addendum)
Please follow-up with your primary doctor this week.   Keep your appointment with the pancreas specialist that you have scheduled.  Thank you for choosing [place] for your health care today!  Please see your primary doctor this week for a follow up appointment.   If you have any new, worsening, or unexpected symptoms call your doctor right away or come back to the emergency department for reevaluation.  It was my pleasure to care for you today.   Daneil Dan Modesto Charon, MD

## 2023-07-29 NOTE — Assessment & Plan Note (Signed)
Stable from a respiratory standpoint Continue home inhalers 

## 2023-07-30 DIAGNOSIS — K861 Other chronic pancreatitis: Secondary | ICD-10-CM | POA: Diagnosis present

## 2023-07-30 DIAGNOSIS — R6510 Systemic inflammatory response syndrome (SIRS) of non-infectious origin without acute organ dysfunction: Secondary | ICD-10-CM | POA: Diagnosis present

## 2023-07-30 DIAGNOSIS — Z885 Allergy status to narcotic agent status: Secondary | ICD-10-CM | POA: Diagnosis not present

## 2023-07-30 DIAGNOSIS — M797 Fibromyalgia: Secondary | ICD-10-CM | POA: Diagnosis present

## 2023-07-30 DIAGNOSIS — K863 Pseudocyst of pancreas: Secondary | ICD-10-CM | POA: Diagnosis present

## 2023-07-30 DIAGNOSIS — E876 Hypokalemia: Secondary | ICD-10-CM | POA: Diagnosis present

## 2023-07-30 DIAGNOSIS — Z888 Allergy status to other drugs, medicaments and biological substances status: Secondary | ICD-10-CM | POA: Diagnosis not present

## 2023-07-30 DIAGNOSIS — K859 Acute pancreatitis without necrosis or infection, unspecified: Principal | ICD-10-CM

## 2023-07-30 DIAGNOSIS — Z91048 Other nonmedicinal substance allergy status: Secondary | ICD-10-CM | POA: Diagnosis not present

## 2023-07-30 DIAGNOSIS — F17210 Nicotine dependence, cigarettes, uncomplicated: Secondary | ICD-10-CM | POA: Diagnosis present

## 2023-07-30 DIAGNOSIS — K7460 Unspecified cirrhosis of liver: Secondary | ICD-10-CM | POA: Diagnosis present

## 2023-07-30 DIAGNOSIS — F1120 Opioid dependence, uncomplicated: Secondary | ICD-10-CM | POA: Diagnosis present

## 2023-07-30 DIAGNOSIS — G40909 Epilepsy, unspecified, not intractable, without status epilepticus: Secondary | ICD-10-CM | POA: Diagnosis present

## 2023-07-30 DIAGNOSIS — Z90710 Acquired absence of both cervix and uterus: Secondary | ICD-10-CM | POA: Diagnosis not present

## 2023-07-30 DIAGNOSIS — Z882 Allergy status to sulfonamides status: Secondary | ICD-10-CM | POA: Diagnosis not present

## 2023-07-30 DIAGNOSIS — G8929 Other chronic pain: Secondary | ICD-10-CM | POA: Diagnosis present

## 2023-07-30 DIAGNOSIS — Z85828 Personal history of other malignant neoplasm of skin: Secondary | ICD-10-CM | POA: Diagnosis not present

## 2023-07-30 DIAGNOSIS — L304 Erythema intertrigo: Secondary | ICD-10-CM | POA: Diagnosis present

## 2023-07-30 DIAGNOSIS — J9611 Chronic respiratory failure with hypoxia: Secondary | ICD-10-CM | POA: Diagnosis present

## 2023-07-30 DIAGNOSIS — Z881 Allergy status to other antibiotic agents status: Secondary | ICD-10-CM | POA: Diagnosis not present

## 2023-07-30 DIAGNOSIS — B370 Candidal stomatitis: Secondary | ICD-10-CM

## 2023-07-30 DIAGNOSIS — Z91040 Latex allergy status: Secondary | ICD-10-CM | POA: Diagnosis not present

## 2023-07-30 DIAGNOSIS — E785 Hyperlipidemia, unspecified: Secondary | ICD-10-CM | POA: Diagnosis present

## 2023-07-30 DIAGNOSIS — J4489 Other specified chronic obstructive pulmonary disease: Secondary | ICD-10-CM | POA: Diagnosis present

## 2023-07-30 DIAGNOSIS — Z886 Allergy status to analgesic agent status: Secondary | ICD-10-CM | POA: Diagnosis not present

## 2023-07-30 LAB — CBC
HCT: 35.5 % — ABNORMAL LOW (ref 36.0–46.0)
Hemoglobin: 11.2 g/dL — ABNORMAL LOW (ref 12.0–15.0)
MCH: 29.4 pg (ref 26.0–34.0)
MCHC: 31.5 g/dL (ref 30.0–36.0)
MCV: 93.2 fL (ref 80.0–100.0)
Platelets: 331 10*3/uL (ref 150–400)
RBC: 3.81 MIL/uL — ABNORMAL LOW (ref 3.87–5.11)
RDW: 15.6 % — ABNORMAL HIGH (ref 11.5–15.5)
WBC: 10.3 10*3/uL (ref 4.0–10.5)
nRBC: 0 % (ref 0.0–0.2)

## 2023-07-30 LAB — COMPREHENSIVE METABOLIC PANEL
ALT: 12 U/L (ref 0–44)
AST: 19 U/L (ref 15–41)
Albumin: 1.8 g/dL — ABNORMAL LOW (ref 3.5–5.0)
Alkaline Phosphatase: 116 U/L (ref 38–126)
Anion gap: 9 (ref 5–15)
BUN: 6 mg/dL (ref 6–20)
CO2: 30 mmol/L (ref 22–32)
Calcium: 8 mg/dL — ABNORMAL LOW (ref 8.9–10.3)
Chloride: 99 mmol/L (ref 98–111)
Creatinine, Ser: 0.46 mg/dL (ref 0.44–1.00)
GFR, Estimated: >60 mL/min (ref >60)
Glucose, Bld: 97 mg/dL (ref 70–99)
Potassium: 3.4 mmol/L — ABNORMAL LOW (ref 3.5–5.1)
Sodium: 138 mmol/L (ref 135–145)
Total Bilirubin: 1 mg/dL (ref <1.2)
Total Protein: 5.5 g/dL — ABNORMAL LOW (ref 6.5–8.1)

## 2023-07-30 LAB — URINALYSIS, COMPLETE (UACMP) WITH MICROSCOPIC
Bacteria, UA: NONE SEEN
Bilirubin Urine: NEGATIVE
Glucose, UA: NEGATIVE mg/dL
Hgb urine dipstick: NEGATIVE
Ketones, ur: NEGATIVE mg/dL
Leukocytes,Ua: NEGATIVE
Nitrite: NEGATIVE
Protein, ur: NEGATIVE mg/dL
Specific Gravity, Urine: 1.03 (ref 1.005–1.030)
pH: 5 (ref 5.0–8.0)

## 2023-07-30 LAB — LACTIC ACID, PLASMA: Lactic Acid, Venous: 0.5 mmol/L (ref 0.5–1.9)

## 2023-07-30 LAB — TROPONIN I (HIGH SENSITIVITY)
Troponin I (High Sensitivity): 3 ng/L (ref <18)
Troponin I (High Sensitivity): 3 ng/L (ref <18)

## 2023-07-30 MED ORDER — GABAPENTIN 300 MG PO CAPS: 300.0000 mg | ORAL_CAPSULE | Freq: Four times a day (QID) | ORAL | Status: DC

## 2023-07-30 MED ORDER — LACOSAMIDE 50 MG PO TABS
200.0000 mg | ORAL_TABLET | Freq: Two times a day (BID) | ORAL | Status: DC
  Administered 2023-07-30 – 2023-07-31 (×3): 200 mg via ORAL
  Filled 2023-07-30 (×3): qty 4

## 2023-07-30 MED ORDER — HYDROCODONE-ACETAMINOPHEN 5-325 MG PO TABS
1.0000 | ORAL_TABLET | Freq: Four times a day (QID) | ORAL | Status: DC | PRN
  Administered 2023-07-31 (×3): 1 via ORAL
  Filled 2023-07-30 (×3): qty 1

## 2023-07-30 MED ORDER — GABAPENTIN 300 MG PO CAPS
300.0000 mg | ORAL_CAPSULE | Freq: Every day | ORAL | Status: DC
  Administered 2023-07-30 – 2023-07-31 (×2): 300 mg via ORAL
  Filled 2023-07-30 (×2): qty 1

## 2023-07-30 MED ORDER — ACETAMINOPHEN 500 MG PO TABS: 500.0000 mg | ORAL_TABLET | Freq: Four times a day (QID) | ORAL | Status: DC | PRN

## 2023-07-30 MED ORDER — ORAL CARE MOUTH RINSE: 15.0000 mL | OROMUCOSAL | Status: DC | PRN

## 2023-07-30 MED ORDER — BACLOFEN 10 MG PO TABS
10.0000 mg | ORAL_TABLET | Freq: Three times a day (TID) | ORAL | Status: DC | PRN
  Filled 2023-07-30: qty 1

## 2023-07-30 MED ORDER — VITAMIN B-12 500 MCG PO TABS: 500.0000 ug | ORAL_TABLET | Freq: Every day | ORAL | Status: DC

## 2023-07-30 MED ORDER — THIAMINE HCL 100 MG PO TABS
100.0000 mg | ORAL_TABLET | Freq: Every day | ORAL | Status: DC
  Administered 2023-07-30 – 2023-07-31 (×2): 100 mg via ORAL
  Filled 2023-07-30 (×4): qty 1

## 2023-07-30 MED ORDER — VITAMIN B-12 1000 MCG PO TABS
500.0000 ug | ORAL_TABLET | Freq: Every day | ORAL | Status: DC
  Administered 2023-07-30 – 2023-07-31 (×2): 500 ug via ORAL
  Filled 2023-07-30 (×2): qty 1

## 2023-07-30 MED ORDER — HYDROXYZINE HCL 25 MG PO TABS: 25.0000 mg | ORAL_TABLET | Freq: Three times a day (TID) | ORAL | Status: DC | PRN

## 2023-07-30 MED ORDER — LEVETIRACETAM 500 MG PO TABS
1500.0000 mg | ORAL_TABLET | Freq: Two times a day (BID) | ORAL | Status: DC
Start: 2023-07-30 — End: 2023-07-31
  Administered 2023-07-30 – 2023-07-31 (×3): 1500 mg via ORAL
  Filled 2023-07-30 (×3): qty 3

## 2023-07-30 MED ORDER — MELATONIN 5 MG PO TABS
10.0000 mg | ORAL_TABLET | Freq: Every day | ORAL | Status: DC
  Administered 2023-07-30: 10 mg via ORAL
  Filled 2023-07-30: qty 2

## 2023-07-30 MED ORDER — GABAPENTIN 300 MG PO CAPS
600.0000 mg | ORAL_CAPSULE | Freq: Every day | ORAL | Status: DC
  Filled 2023-07-30 (×2): qty 2

## 2023-07-30 MED ORDER — NYSTATIN 100000 UNIT/GM EX CREA
TOPICAL_CREAM | Freq: Two times a day (BID) | CUTANEOUS | Status: DC
  Administered 2023-07-30: 1 via TOPICAL
  Filled 2023-07-30: qty 30

## 2023-07-30 MED ORDER — GABAPENTIN 300 MG PO CAPS
600.0000 mg | ORAL_CAPSULE | Freq: Every day | ORAL | Status: DC
Start: 2023-07-31 — End: 2023-07-31
  Administered 2023-07-31: 600 mg via ORAL
  Filled 2023-07-30: qty 2

## 2023-07-30 MED ORDER — NYSTATIN 100000 UNIT/ML MT SUSP
5.0000 mL | Freq: Four times a day (QID) | OROMUCOSAL | Status: AC
  Administered 2023-07-30 – 2023-07-31 (×6): 500000 [IU] via ORAL
  Filled 2023-07-30 (×6): qty 5

## 2023-07-30 MED ORDER — FOLIC ACID 1 MG PO TABS
1.0000 mg | ORAL_TABLET | Freq: Every day | ORAL | Status: DC
  Administered 2023-07-30 – 2023-07-31 (×2): 1 mg via ORAL
  Filled 2023-07-30 (×2): qty 1

## 2023-07-30 MED ORDER — PHENYTOIN 50 MG PO CHEW
100.0000 mg | CHEWABLE_TABLET | Freq: Three times a day (TID) | ORAL | Status: DC
  Administered 2023-07-30 – 2023-07-31 (×5): 100 mg via ORAL
  Filled 2023-07-30 (×6): qty 2

## 2023-07-30 NOTE — ED Notes (Signed)
ED TO INPATIENT HANDOFF REPORT  ED Nurse Name and Phone #: Vollie Brunty [phone]  S Name/Age/Gender Abigail [name] 57 y.o. female Room/Bed: ED14A/ED14A  Code Status   Code Status: Full Code  Home/SNF/Other Home Patient oriented to: self, place, time, and situation Is this baseline? Yes   Triage Complete: Triage complete  Chief Complaint Acute on chronic pancreatitis (HCC) [K85.90, K86.1]  Triage Note Patient BIB [facility] from home c/o abdominal pain since 1400 yesterday.  Patient has history of pancreatitis. States this feels like a flare up.   Allergies Allergies  Allergen Reactions   Nsaids Hives   Tapentadol Swelling, Rash and Other (See Comments)    Nucynta- Made her deathly sick   Cephalexin Other (See Comments)    Reaction not cited   Codeine Other (See Comments)    Reaction not cited   Darvocet [Propoxyphene N-Acetaminophen] Other (See Comments)    Reaction not cited   Latex Other (See Comments)    Reaction not cited   Silicone Other (See Comments)    Reaction not cited   Sulfa Antibiotics Hives   Tape Other (See Comments)    Reaction not cited   Meloxicam Rash    Level of Care/Admitting Diagnosis ED Disposition     ED Disposition  Admit   Condition  --   Comment  Hospital Area: [name] [[id number]]  Level of Care: Med-Surg [16]  Covid Evaluation: Confirmed COVID Negative  Diagnosis: Acute on chronic pancreatitis [name]) [[id number]]  Admitting Physician: Floydene Flock [[id number]]  Attending Physician: Floydene Flock [[id number]]          B Medical/Surgery History Past Medical History:  Diagnosis Date   Anxiety    Arthritis    Back pain    Basal cell carcinoma 10/04/2021   R axilla - needs excised 11/28/21   Basal cell carcinoma 10/04/2021   L antecubital excised 11/14/21   Diarrhea 11/12/2016   Fibromyalgia    Generalized abdominal pain 11/12/2016   H. pylori infection    Hyperlipidemia    IBS (irritable bowel  syndrome)    Infectious colitis 04/29/2016   Migraines    Moderate dehydration 04/29/2016   Muscle pain    Opioid overdose (HCC) 12/07/2022   Reflux    Unexplained weight loss 11/12/2016   Past Surgical History:  Procedure Laterality Date   ABDOMINAL HYSTERECTOMY     APPENDECTOMY  2009   C5 FUSION     C6 FUSION     C7 FUSION     COLONOSCOPY  02/2006   COLONOSCOPY WITH PROPOFOL N/A 12/25/2016   Procedure: COLONOSCOPY WITH PROPOFOL;  Surgeon: Earline Mayotte, MD;  Location: [facility] ENDOSCOPY;  Service: Endoscopy;  Laterality: N/A;   ESOPHAGOGASTRODUODENOSCOPY (EGD) WITH PROPOFOL N/A 12/25/2016   Procedure: ESOPHAGOGASTRODUODENOSCOPY (EGD) WITH PROPOFOL;  Surgeon: Earline Mayotte, MD;  Location: [facility] ENDOSCOPY;  Service: Endoscopy;  Laterality: N/A;   FOOT SURGERY     HIP ARTHROPLASTY     L4 FUSION     L5 FUSION     S1 FUSION       A IV Location/Drains/Wounds Patient Lines/Drains/Airways Status     Active Line/Drains/Airways     Name Placement date Placement time Site Days   Peripheral IV 07/29/23 20 G Left;Posterior Hand 07/29/23  0229  Hand  1            Intake/Output Last 24 hours  Intake/Output Summary (Last 24 hours) at 07/30/2023 0730 Last data filed at 07/29/2023  1801 Gross per 24 hour  Intake 411.81 ml  Output --  Net 411.81 ml    Labs/Imaging Results for orders placed or performed during the hospital encounter of 07/29/23 (from the past 48 hour(s))  CBC with Differential     Status: Abnormal   Collection Time: 07/29/23  2:37 AM  Result Value Ref Range   WBC 14.4 (H) 4.0 - 10.5 K/uL   RBC 4.48 3.87 - 5.11 MIL/uL   Hemoglobin 13.1 12.0 - 15.0 g/dL   HCT 98.1 19.1 - 47.8 %   MCV 89.5 80.0 - 100.0 fL   MCH 29.2 26.0 - 34.0 pg   MCHC 32.7 30.0 - 36.0 g/dL   RDW 29.5 62.1 - 30.8 %   Platelets 435 (H) 150 - 400 K/uL   nRBC 0.0 0.0 - 0.2 %   Neutrophils Relative % 84 %   Neutro Abs 11.9 (H) 1.7 - 7.7 K/uL   Lymphocytes Relative 10 %   Lymphs Abs 1.5  0.7 - 4.0 K/uL   Monocytes Relative 6 %   Monocytes Absolute 0.8 0.1 - 1.0 K/uL   Eosinophils Relative 0 %   Eosinophils Absolute 0.1 0.0 - 0.5 K/uL   Basophils Relative 0 %   Basophils Absolute 0.1 0.0 - 0.1 K/uL   Immature Granulocytes 0 %   Abs Immature Granulocytes 0.06 0.00 - 0.07 K/uL    Comment: Performed at [name], [address] [place], [place] [address]  Comprehensive metabolic panel     Status: Abnormal   Collection Time: 07/29/23  3:23 AM  Result Value Ref Range   Sodium 137 135 - 145 mmol/L   Potassium 3.1 (L) 3.5 - 5.1 mmol/L   Chloride 95 (L) 98 - 111 mmol/L   CO2 30 22 - 32 mmol/L   Glucose, Bld 129 (H) 70 - 99 mg/dL    Comment: Glucose reference range applies only to samples taken after fasting for at least 8 hours.   BUN <5 (L) 6 - 20 mg/dL   Creatinine, Ser 6.96 0.44 - 1.00 mg/dL   Calcium 8.3 (L) 8.9 - 10.3 mg/dL   Total Protein 7.5 6.5 - 8.1 g/dL   Albumin 2.4 (L) 3.5 - 5.0 g/dL   AST 56 (H) 15 - 41 U/L   ALT 19 0 - 44 U/L   Alkaline Phosphatase 170 (H) 38 - 126 U/L   Total Bilirubin 0.7 <1.2 mg/dL   GFR, Estimated >29 >52 mL/min    Comment: (NOTE) Calculated using the CKD-EPI Creatinine Equation (2021)    Anion gap 12 5 - 15    Comment: Performed at [facility], [address] [place], [place] [address]  Lipase, blood     Status: Abnormal   Collection Time: 07/29/23  3:23 AM  Result Value Ref Range   Lipase 88 (H) 11 - 51 U/L    Comment: Performed at [facility], [address] [place], [place] [address]  Urinalysis, Complete w Microscopic -Urine, Clean Catch     Status: Abnormal   Collection Time: 07/29/23  5:17 AM  Result Value Ref Range   Color, Urine AMBER (A) YELLOW    Comment: BIOCHEMICALS MAY BE AFFECTED BY COLOR   APPearance CLEAR (A) CLEAR   Specific Gravity, Urine 1.030 1.005 - 1.030   pH 5.0 5.0 - 8.0   Glucose, UA NEGATIVE NEGATIVE mg/dL   Hgb urine dipstick NEGATIVE NEGATIVE   Bilirubin Urine  NEGATIVE NEGATIVE   Ketones, ur NEGATIVE  NEGATIVE mg/dL   Protein, ur NEGATIVE NEGATIVE mg/dL   Nitrite NEGATIVE NEGATIVE   Leukocytes,Ua NEGATIVE NEGATIVE   RBC / HPF 0-5 0 - 5 RBC/hpf   WBC, UA 0-5 0 - 5 WBC/hpf   Bacteria, UA NONE SEEN NONE SEEN   Squamous Epithelial / HPF 6-10 0 - 5 /HPF   Mucus PRESENT     Comment: Performed at [name], [address] [place], [place] [address]  Procalcitonin     Status: None   Collection Time: 07/29/23  9:47 AM  Result Value Ref Range   Procalcitonin <0.10 ng/mL    Comment:        Interpretation: PCT (Procalcitonin) <= 0.5 ng/mL: Systemic infection (sepsis) is not likely. Local bacterial infection is possible. (NOTE)       Sepsis PCT Algorithm           Lower Respiratory Tract                                      Infection PCT Algorithm    ----------------------------     ----------------------------         PCT < 0.25 ng/mL                PCT < 0.10 ng/mL          Strongly encourage             Strongly discourage   discontinuation of antibiotics    initiation of antibiotics    ----------------------------     -----------------------------       PCT 0.25 - 0.50 ng/mL            PCT 0.10 - 0.25 ng/mL               OR       >80% decrease in PCT            Discourage initiation of                                            antibiotics      Encourage discontinuation           of antibiotics    ----------------------------     -----------------------------         PCT >= 0.50 ng/mL              PCT 0.26 - 0.50 ng/mL               AND        <80% decrease in PCT             Encourage initiation of                                             antibiotics       Encourage continuation           of antibiotics    ----------------------------     -----------------------------        PCT >= 0.50 ng/mL                  PCT > 0.50 ng/mL  AND         increase in PCT                  Strongly encourage                                       initiation of antibiotics    Strongly encourage escalation           of antibiotics                                     -----------------------------                                           PCT <= 0.25 ng/mL                                                 OR                                        > 80% decrease in PCT                                      Discontinue / Do not initiate                                             antibiotics  Performed at [facility], [address] [place], [place] [address]   Lactic acid, plasma     Status: Abnormal   Collection Time: 07/29/23  9:47 AM  Result Value Ref Range   Lactic Acid, Venous 2.0 (HH) 0.5 - 1.9 mmol/L    Comment: CRITICAL RESULT CALLED TO, READ BACK BY AND VERIFIED WITH Lurline Idol RN 1011 07/29/23 HNM Performed at [facility], [address] [place], [place] [address]   Protime-INR     Status: None   Collection Time: 07/29/23 11:18 AM  Result Value Ref Range   Prothrombin Time 15.1 11.4 - 15.2 seconds   INR 1.2 0.8 - 1.2    Comment: (NOTE) INR goal varies based on device and disease states. Performed at [facility], [address] [place], [place] [address]   APTT     Status: Abnormal   Collection Time: 07/29/23 11:18 AM  Result Value Ref Range   aPTT 37 (H) 24 - 36 seconds    Comment:        IF BASELINE aPTT IS ELEVATED, SUGGEST PATIENT RISK ASSESSMENT BE USED TO DETERMINE APPROPRIATE ANTICOAGULANT THERAPY. Performed at [facility], [address] [place], [place] [address]   CBC     Status: Abnormal   Collection Time: 07/30/23  4:59 AM  Result Value Ref Range   WBC 10.3 4.0 - 10.5 K/uL   RBC 3.81 (L)  3.87 - 5.11 MIL/uL   Hemoglobin 11.2 (L) 12.0 - 15.0 g/dL   HCT 19.1 (L) 47.8 - 29.5 %   MCV 93.2 80.0 - 100.0 fL   MCH 29.4 26.0 - 34.0 pg   MCHC 31.5 30.0 - 36.0 g/dL   RDW 62.1 (H) 30.8 - 65.7 %   Platelets 331 150 - 400 K/uL   nRBC 0.0  0.0 - 0.2 %    Comment: Performed at [name], [address] [place], [place] [address]  Comprehensive metabolic panel     Status: Abnormal   Collection Time: 07/30/23  4:59 AM  Result Value Ref Range   Sodium 138 135 - 145 mmol/L   Potassium 3.4 (L) 3.5 - 5.1 mmol/L   Chloride 99 98 - 111 mmol/L   CO2 30 22 - 32 mmol/L   Glucose, Bld 97 70 - 99 mg/dL    Comment: Glucose reference range applies only to samples taken after fasting for at least 8 hours.   BUN 6 6 - 20 mg/dL   Creatinine, Ser 2.95 0.44 - 1.00 mg/dL   Calcium 8.0 (L) 8.9 - 10.3 mg/dL   Total Protein 5.5 (L) 6.5 - 8.1 g/dL   Albumin 1.8 (L) 3.5 - 5.0 g/dL   AST 19 15 - 41 U/L   ALT 12 0 - 44 U/L   Alkaline Phosphatase 116 38 - 126 U/L   Total Bilirubin 1.0 <1.2 mg/dL   GFR, Estimated >28 >41 mL/min    Comment: (NOTE) Calculated using the CKD-EPI Creatinine Equation (2021)    Anion gap 9 5 - 15    Comment: Performed at [facility], [address] [place], [place] [address]   CT ABDOMEN PELVIS W CONTRAST  Result Date: 07/29/2023 CLINICAL DATA:  57 year old female with history of severe acute pancreatitis. EXAM: CT ABDOMEN AND PELVIS WITH CONTRAST TECHNIQUE: Multidetector CT imaging of the abdomen and pelvis was performed using the standard protocol following bolus administration of intravenous contrast. RADIATION DOSE REDUCTION: This exam was performed according to the departmental dose-optimization program which includes automated exposure control, adjustment of the mA and/or kV according to patient size and/or use of iterative reconstruction technique. CONTRAST:  OMNIPAQUE IOHEXOL 300 MG/ML  SOLN COMPARISON:  CT of the abdomen and pelvis 07/12/2023. FINDINGS: Lower chest: Bibasilar areas of atelectasis and/or scarring are noted, most severe in the left lower lobe. Hepatobiliary: No suspicious cystic or solid hepatic lesions. No intra or extrahepatic biliary ductal dilatation. Gallbladder is  moderately distended. Some intermediate attenuation material lies dependently in the gallbladder, which may reflect biliary sludge. No definite calcified gallstones are noted. Trace amount of pericholecystic fluid. Pancreas: Multiple low-attenuation lesions scattered throughout the pancreatic parenchyma, likely multiple pseudocysts, largest of which is in the pancreatic head (axial image 37 of series 2 and coronal image 38 of series 5) measuring 6.2 x 5.2 x 5.6 cm. Another low-attenuation lesion is noted in the gastrosplenic ligament adjacent to the tail of the pancreas (axial image 18 of series 2 and coronal image 52 of series 5) measuring 4.2 x 3.0 x 5.7 cm. Small amount of soft tissue stranding surrounding the pancreas, particularly adjacent to the head and uncinate process. Spleen: Linear perfusion deficit in the superior aspect of the spleen, compatible with a resolving splenic infarct. Adrenals/Urinary Tract: Bilateral kidneys and bilateral adrenal glands are normal in appearance. No hydroureteronephrosis. Urinary bladder is nearly decompressed and largely obscured by beam hardening artifact from the patient's right hip arthroplasty.  Stomach/Bowel: The appearance of the stomach is normal. There is no pathologic dilatation of small bowel or colon. Status post appendectomy. Vascular/Lymphatic: Atherosclerotic calcifications in the abdominal aorta and pelvic vasculature. No lymphadenopathy noted in the abdomen or pelvis. Reproductive: Status post hysterectomy. Ovaries are not confidently identified may be surgically absent or atrophic. Other: Small volume of ascites. Extensive inflammation centered in the left upper quadrant of the abdomen inferior to the spleen and lateral to the stomach where there are multiple poorly defined low-attenuation fluid collections, some of which demonstrates some peripheral rim enhancement (example on axial image 33 of series 2 measuring up to 4.7 x 1.8 cm). Some of this fluid and  inflammation is now tracking in the left pericolic gutter, including a small collection measuring 2.4 x 1.2 cm (axial image 43 of series 2), increased compared to the prior study. Musculoskeletal: Postoperative changes of lower lumbar spine and lumbosacral fusion. Chronic appearing compression fractures of T12 and L1 with up to 40% loss of anterior vertebral body height at T12. Multiple thoracic vertebral body compression fractures, similar to the prior study. There are no aggressive appearing lytic or blastic lesions noted in the visualized portions of the skeleton. IMPRESSION: 1. Soft tissue stranding surrounding the pancreas, compatible with reported clinical history of acute pancreatitis. 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 Multiple poorly defined pancreatic pseudocysts and inflammation also centered in the omentum of the left upper quadrant, similar to the prior study, although there is some extension of this inflammation caudally in the left pericolic gutter, as discussed above. 2. Resolving splenic infarct again noted. 3. Aortic atherosclerosis. 4. Additional incidental findings, as above. Electronically Signed   By: Trudie Reed M.D.   On: 07/29/2023 07:47    Pending Labs Unresulted Labs (From admission, onward)     Start     Ordered   07/29/23 1015  Culture, blood (x 2)  BLOOD CULTURE X 2,   TIMED     Comments: INITIATE ANTIBIOTICS WITHIN 1 HOUR AFTER BLOOD CULTURES DRAWN.  If unable to obtain blood cultures, call MD immediately regarding antibiotic instructions.    07/29/23 1014   07/29/23 0929  Lactic acid, plasma  (Lactic Acid)  STAT Now then every 3 hours,   STAT      07/29/23 0929            Vitals/Pain Today's Vitals   07/30/23 0530 07/30/23 0600 07/30/23 0630 07/30/23 0656  BP: 102/62 (!) 96/52 (!) 113/59   Pulse: 91 92 87    Resp: 17 (!) 23 (!) 29   Temp:    98.2 F (36.8 C)  TempSrc:    Oral  SpO2: 97% 99% 96%   Weight:      PainSc:        Isolation Precautions No active isolations  Medications Medications  enoxaparin (LOVENOX) injection 40 mg (40 mg Subcutaneous Given 07/29/23 1204)  promethazine (PHENERGAN) tablet 12.5 mg (has no administration in time range)  lacosamide (VIMPAT) 200 mg in sodium chloride 0.9 % 25 mL IVPB (0 mg Intravenous Stopped 07/29/23 2210)  levETIRAcetam (KEPPRA) IVPB 1500 mg/ 100 mL premix (0 mg Intravenous Stopped 07/30/23 0019)  phenytoin (DILANTIN) injection 100 mg (100 mg Intravenous Given 07/30/23 0651)  nicotine (NICODERM CQ - dosed in mg/24 hours) patch 21 mg (21 mg Transdermal Patch Applied 07/29/23 1008)  albuterol (  PROVENTIL) (2.5 MG/3ML) 0.083% nebulizer solution 2.5 mg (has no administration in time range)  lactated ringers infusion (0 mLs Intravenous Stopped 07/30/23 0500)  HYDROmorphone (DILAUDID) injection 1 mg (1 mg Intravenous Given 07/30/23 0323)  piperacillin-tazobactam (ZOSYN) IVPB 3.375 g (3.375 g Intravenous New Bag/Given 07/30/23 0456)  morphine (PF) 4 MG/ML injection 4 mg (4 mg Intravenous Given 07/29/23 0427)  ondansetron (ZOFRAN) injection 4 mg (4 mg Intravenous Given 07/29/23 0427)  sodium chloride 0.9 % bolus 1,000 mL (0 mLs Intravenous Stopped 07/29/23 0532)  droperidol (INAPSINE) 2.5 MG/ML injection 2.5 mg (2.5 mg Intravenous Given 07/29/23 0704)  sodium chloride 0.9 % bolus 1,000 mL (0 mLs Intravenous Stopped 07/29/23 0947)  morphine (PF) 4 MG/ML injection 4 mg (4 mg Intravenous Given 07/29/23 0702)  iohexol (OMNIPAQUE) 300 MG/ML solution 100 mL (100 mLs Intravenous Contrast Given 07/29/23 0710)  HYDROmorphone (DILAUDID) injection 1.5 mg (1.5 mg Intravenous Given 07/29/23 0820)    Mobility walks     Focused Assessments Abdominal pain, diffuse   R Recommendations: See Admitting Provider Note  Report given to:   Additional Notes:

## 2023-07-30 NOTE — Progress Notes (Signed)
PROGRESS NOTE  Abigail [name]    DOB: 1966/06/14, 57 y.o.  GMW:[id number]    Code Status: Full Code   DOA: 07/29/2023   LOS: 0   Brief hospital course  [name] is a 57 y.o. female with medical history significant of seizure disorder/epilepsy, chronic pancreatitis, chronic pain, giant cell arteritis, chronic respiratory failure on 2 L, asthma/COPD presenting with acute on chronic pancreatitis. Followed by [facility] gastroenterology.   ER course: afebrile, heart rate 100s, BP stable.  Satting well on room air.  White count 14.4, hemoglobin 13.1, platelets 435, creatinine 0.46, sodium 129, alk phos 170.  Lipase 88.  T. bili 0.7.  AST 56, ALT 19.  CT abdomen pelvis with acute pancreatitis with stable cirrhosis.  They were initially treated with analgesia and IVF.   Patient was admitted to medicine service for further workup and management of acute pancreatitis as outlined in detail below.  07/30/23 -appears comfortable but complaining of significant pain in many locations and several complaints of ongoing chronic problems.   Assessment & Plan  Principal Problem:   Acute on chronic pancreatitis (HCC) Active Problems:   Chronic pain   Asthma   Tobacco use disorder   Seizure (HCC)   Chronic respiratory failure with hypoxia (HCC)   SIRS (systemic inflammatory response syndrome) (HCC)   Pancreatitis  Acute on chronic pancreatitis (HCC) Lipase in the 80s which appears near baseline CT imaging concerning for acute pancreatitis with relatively stable pancreatic pseudocyst. Troponin and ECG today negative for ACS. Followed by [facility] gastroenterology - Pain control - tolerating PO clear liquids and IV fluids are not available at this time. Advance diet as tolerated.    SIRS (systemic inflammatory response syndrome) (HCC) Infection less likely. Negative leukocytosis. Repeat LA 0.5 - f/u blood cultures NGTD - stopping zosyn - monitor for fever curve  Intertrigo  Possible  thrush- contributed by inhalers, steroids.  - nystatin mouthwash and cream - rinse mouth after steroid inhalers   Chronic respiratory failure with hypoxia (HCC) Baseline 2 L nasal cannula use in setting of heavy tobacco use and likely undiagnosed COPD Appears fairly stable from a respiratory standpoint Continue supplemental oxygen with added humidifier as patient endorses irritation from nasal tubing.  Monitor - continue home inhalers   Seizure (HCC) Baseline seizure disorder with multiple recent admissions for the same Patient denies any recent seizure events - continue home regimen including Keppra/Vimpat/phenytoin    Tobacco use disorder 1-2 pack-a-day smoker Nicotine patch  Chronic pain - titrate on home regimen plus acute pain management while avoiding sedation  Body mass index is 25.06 kg/m.  VTE ppx: enoxaparin (LOVENOX) injection 40 mg Start: 07/29/23 1200   Diet:     Diet   Diet clear liquid Room service appropriate? Yes; Fluid consistency: Thin   Consultants: None   Subjective 07/30/23    Pt reports having a headache which is chronic for her and she typically takes tylenol with caffeine or BC powder. Denies nausea. Epigastric pain rated a 6. Has L shoulder pain. No injuries   Objective   Vitals:   07/30/23 0630 07/30/23 0656 07/30/23 0800 07/30/23 1126  BP: (!) 113/59  (!) 96/48 117/80  Pulse: 87  87 85  Resp: (!) 29  16   Temp:  98.2 F (36.8 C) 97.9 F (36.6 C) 97.6 F (36.4 C)  TempSrc:  Oral  Oral  SpO2: 96%  96% (!) 89%  Weight:      Height:  5\' 7"  (1.702 m)    Intake/Output Summary (Last 24 hours) at 07/30/2023 1412 Last data filed at 07/29/2023 1801 Gross per 24 hour  Intake 411.81 ml  Output --  Net 411.81 ml   Filed Weights   07/29/23 0230  Weight: 72.6 kg    Physical Exam:  General: awake, alert, NAD HEENT: atraumatic, clear conjunctiva, anicteric sclera, MMM, hearing grossly normal. White film on tongue Respiratory: normal  respiratory effort. Cardiovascular: quick capillary refill Gastrointestinal: soft, tender to epigastric area Nervous: A&O x3. no gross focal neurologic deficits, normal speech Extremities: moves all equally, no edema, normal tone Skin: dry, intact, normal temperature, normal color. No rashes, lesions or ulcers on exposed skin Psychiatry: flat mood, congruent affect  Labs   I have personally reviewed the following labs and imaging studies CBC    Component Value Date/Time   WBC 10.3 07/30/2023 0459   RBC 3.81 (L) 07/30/2023 0459   HGB 11.2 (L) 07/30/2023 0459   HGB 14.2 05/24/2014 1903   HCT 35.5 (L) 07/30/2023 0459   HCT 44.1 05/24/2014 1903   PLT 331 07/30/2023 0459   PLT 349 05/24/2014 1903   MCV 93.2 07/30/2023 0459   MCV 96 05/24/2014 1903   MCH 29.4 07/30/2023 0459   MCHC 31.5 07/30/2023 0459   RDW 15.6 (H) 07/30/2023 0459   RDW 15.1 (H) 05/24/2014 1903   LYMPHSABS 1.5 07/29/2023 0237   LYMPHSABS 2.5 05/24/2014 1903   MONOABS 0.8 07/29/2023 0237   MONOABS 0.9 05/24/2014 1903   EOSABS 0.1 07/29/2023 0237   EOSABS 0.1 05/24/2014 1903   BASOSABS 0.1 07/29/2023 0237   BASOSABS 0.1 05/24/2014 1903      Latest Ref Rng & Units 07/30/2023    4:59 AM 07/29/2023    3:23 AM 07/14/2023    5:42 AM  BMP  Glucose 70 - 99 mg/dL 97  161  77   BUN 6 - 20 mg/dL 6  <5  7   Creatinine 0.96 - 1.00 mg/dL 0.45  4.09  8.11   Sodium 135 - 145 mmol/L 138  137  135   Potassium 3.5 - 5.1 mmol/L 3.4  3.1  3.8   Chloride 98 - 111 mmol/L 99  95  95   CO2 22 - 32 mmol/L 30  30  29    Calcium 8.9 - 10.3 mg/dL 8.0  8.3  8.3     CT ABDOMEN PELVIS W CONTRAST  Result Date: 07/29/2023 CLINICAL DATA:  57 year old female with history of severe acute pancreatitis. EXAM: CT ABDOMEN AND PELVIS WITH CONTRAST TECHNIQUE: Multidetector CT imaging of the abdomen and pelvis was performed using the standard protocol following bolus administration of intravenous contrast. RADIATION DOSE REDUCTION: This exam was  performed according to the departmental dose-optimization program which includes automated exposure control, adjustment of the mA and/or kV according to patient size and/or use of iterative reconstruction technique. CONTRAST:  OMNIPAQUE IOHEXOL 300 MG/ML  SOLN COMPARISON:  CT of the abdomen and pelvis 07/12/2023. FINDINGS: Lower chest: Bibasilar areas of atelectasis and/or scarring are noted, most severe in the left lower lobe. Hepatobiliary: No suspicious cystic or solid hepatic lesions. No intra or extrahepatic biliary ductal dilatation. Gallbladder is moderately distended. Some intermediate attenuation material lies dependently in the gallbladder, which may reflect biliary sludge. No definite calcified gallstones are noted. Trace amount of pericholecystic fluid. Pancreas: Multiple low-attenuation lesions scattered throughout the pancreatic parenchyma, likely multiple pseudocysts, largest of which is in the pancreatic head (axial image 37 of series  2 and coronal image 38 of series 5) measuring 6.2 x 5.2 x 5.6 cm. Another low-attenuation lesion is noted in the gastrosplenic ligament adjacent to the tail of the pancreas (axial image 18 of series 2 and coronal image 52 of series 5) measuring 4.2 x 3.0 x 5.7 cm. Small amount of soft tissue stranding surrounding the pancreas, particularly adjacent to the head and uncinate process. Spleen: Linear perfusion deficit in the superior aspect of the spleen, compatible with a resolving splenic infarct. Adrenals/Urinary Tract: Bilateral kidneys and bilateral adrenal glands are normal in appearance. No hydroureteronephrosis. Urinary bladder is nearly decompressed and largely obscured by beam hardening artifact from the patient's right hip arthroplasty. Stomach/Bowel: The appearance of the stomach is normal. There is no pathologic dilatation of small bowel or colon. Status post appendectomy. Vascular/Lymphatic: Atherosclerotic calcifications in the abdominal aorta and  pelvic vasculature. No lymphadenopathy noted in the abdomen or pelvis. Reproductive: Status post hysterectomy. Ovaries are not confidently identified may be surgically absent or atrophic. Other: Small volume of ascites. Extensive inflammation centered in the left upper quadrant of the abdomen inferior to the spleen and lateral to the stomach where there are multiple poorly defined low-attenuation fluid collections, some of which demonstrates some peripheral rim enhancement (example on axial image 33 of series 2 measuring up to 4.7 x 1.8 cm). Some of this fluid and inflammation is now tracking in the left pericolic gutter, including a small collection measuring 2.4 x 1.2 cm (axial image 43 of series 2), increased compared to the prior study. Musculoskeletal: Postoperative changes of lower lumbar spine and lumbosacral fusion. Chronic appearing compression fractures of T12 and L1 with up to 40% loss of anterior vertebral body height at T12. Multiple thoracic vertebral body compression fractures, similar to the prior study. There are no aggressive appearing lytic or blastic lesions noted in the visualized portions of the skeleton. IMPRESSION: 1. Soft tissue stranding surrounding the pancreas, compatible with reported clinical history of acute pancreatitis. 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 Multiple poorly defined pancreatic pseudocysts and inflammation also centered in the omentum of the left upper quadrant, similar to the prior study, although there is some extension of this inflammation caudally in the left pericolic gutter, as discussed above. 2. Resolving splenic infarct again noted. 3. Aortic atherosclerosis. 4. Additional incidental findings, as above. Electronically Signed   By: Trudie Reed M.D.   On: 07/29/2023 07:47    Disposition Plan & Communication   Patient status: Inpatient  Admitted From: Home Planned disposition location: Home Anticipated discharge date: 12/12 pending clinical improvement   Family Communication: none at bedside    Author: Leeroy Bock, DO [facility] 07/30/2023, 2:12 PM   Available by Epic secure chat 7AM-7PM. If 7PM-7AM, please contact night-coverage.  [facility] contact information found on [email or web address].

## 2023-07-30 NOTE — Plan of Care (Signed)

## 2023-07-30 NOTE — Progress Notes (Signed)
MD notified that the patient has been lethargic this afternoon. her VS are stable. She did have a 72% earlier because she had taken her oxygen off. it came back up once o2 was put back on. MD notified that evening gabapentin 600mg  held due to lethargy.

## 2023-07-31 ENCOUNTER — Inpatient Hospital Stay: Payer: Medicare HMO

## 2023-07-31 DIAGNOSIS — J9611 Chronic respiratory failure with hypoxia: Secondary | ICD-10-CM | POA: Diagnosis not present

## 2023-07-31 DIAGNOSIS — K861 Other chronic pancreatitis: Secondary | ICD-10-CM | POA: Diagnosis not present

## 2023-07-31 DIAGNOSIS — R6510 Systemic inflammatory response syndrome (SIRS) of non-infectious origin without acute organ dysfunction: Secondary | ICD-10-CM

## 2023-07-31 DIAGNOSIS — K859 Acute pancreatitis without necrosis or infection, unspecified: Secondary | ICD-10-CM | POA: Diagnosis not present

## 2023-07-31 LAB — BASIC METABOLIC PANEL
Anion gap: 10 (ref 5–15)
BUN: 9 mg/dL (ref 6–20)
CO2: 30 mmol/L (ref 22–32)
Calcium: 7.9 mg/dL — ABNORMAL LOW (ref 8.9–10.3)
Chloride: 98 mmol/L (ref 98–111)
Creatinine, Ser: 0.46 mg/dL (ref 0.44–1.00)
GFR, Estimated: >60 mL/min (ref >60)
Glucose, Bld: 83 mg/dL (ref 70–99)
Potassium: 3.2 mmol/L — ABNORMAL LOW (ref 3.5–5.1)
Sodium: 138 mmol/L (ref 135–145)

## 2023-07-31 LAB — MAGNESIUM: Magnesium: 1.9 mg/dL (ref 1.7–2.4)

## 2023-07-31 LAB — PHOSPHORUS: Phosphorus: 3.5 mg/dL (ref 2.5–4.6)

## 2023-07-31 MED ORDER — POTASSIUM CHLORIDE CRYS ER 10 MEQ PO TBCR: 10.0000 meq | EXTENDED_RELEASE_TABLET | Freq: Two times a day (BID) | ORAL | 0 refills | Status: DC

## 2023-07-31 MED ORDER — DULOXETINE HCL 30 MG PO CPEP
60.0000 mg | ORAL_CAPSULE | Freq: Two times a day (BID) | ORAL | Status: DC
Start: 2023-07-31 — End: 2023-07-31
  Administered 2023-07-31: 60 mg via ORAL
  Filled 2023-07-31: qty 2

## 2023-07-31 MED ORDER — PANTOPRAZOLE SODIUM 40 MG PO TBEC
40.0000 mg | DELAYED_RELEASE_TABLET | Freq: Two times a day (BID) | ORAL | Status: DC
  Administered 2023-07-31 (×2): 40 mg via ORAL
  Filled 2023-07-31 (×2): qty 1

## 2023-07-31 MED ORDER — NYSTATIN 100000 UNIT/ML MT SUSP: 5.0000 mL | Freq: Four times a day (QID) | OROMUCOSAL | 0 refills | Status: AC

## 2023-07-31 MED ORDER — POTASSIUM CHLORIDE CRYS ER 20 MEQ PO TBCR
40.0000 meq | EXTENDED_RELEASE_TABLET | ORAL | Status: AC
  Administered 2023-07-31 (×2): 40 meq via ORAL
  Filled 2023-07-31 (×2): qty 2

## 2023-07-31 MED ORDER — DULOXETINE HCL 60 MG PO CPEP: 60.0000 mg | ORAL_CAPSULE | Freq: Two times a day (BID) | ORAL | Status: DC

## 2023-07-31 NOTE — TOC Transition Note (Signed)
Transition of Care [facility]) - CM/SW Discharge Note   Patient Details  Name: Abigail [name] [facility] MRN: [id number] Date of Birth: [birth year]  [facility] of [facility]) CM/SW Contact:  Margarito Liner, LCSW Phone Number: 07/31/2023, 2:49 PM   Clinical Narrative:  Patient has orders to discharge home. CSW left message for [facility] liaison to notify. CSW ordered wheelchair through [employer]. Sent secure chat to RN to notify. No further concerns. CSW signing off.   Final next level of care: Home w Home Health Services Barriers to Discharge: Barriers Resolved   Patient Goals and CMS Choice   Choice offered to / list presented to : Patient  Discharge Placement                  Patient to be transferred to facility by: Friend   Patient and family notified of of transfer: 07/31/23  Discharge Plan and Services Additional resources added to the After Visit Summary for       Post Acute Care Choice: Resumption of Svcs/PTA Provider          DME Arranged: Wheelchair manual DME Agency: [employer] Date DME Agency Contacted: 07/31/23   Representative spoke with at DME Agency: Yvone Neu HH Arranged: RN, PT [facility] Agency: [facility] Date [facility] Agency Contacted: 07/31/23   Representative spoke with at [facility] Agency: Alfonzo Beers  Social Determinants of Health (SDOH) Interventions SDOH Screenings   Food Insecurity: No Food Insecurity (07/30/2023)  Recent Concern: Food Insecurity - Food Insecurity Present (07/02/2023)  Housing: Patient Unable To Answer (07/30/2023)  Transportation Needs: No Transportation Needs (07/30/2023)  Utilities: Not At Risk (07/30/2023)  Tobacco Use: High Risk (07/29/2023)     Readmission Risk Interventions    07/31/2023    1:07 PM  Readmission Risk Prevention Plan  Transportation Screening Complete  Medication Review (RN Care Manager) Complete  PCP or Specialist appointment within 3-5 days of discharge Complete  HRI or Home Care Consult  Complete  SW Recovery Care/Counseling Consult Complete  Palliative Care Screening Not Applicable  Skilled Nursing Facility Not Applicable

## 2023-07-31 NOTE — TOC Initial Note (Signed)
Transition of Care [facility]) - Initial/Assessment Note    Patient Details  Name: Abigail [name] [facility] MRN: [id number] Date of Birth: [birth year]  Transition of Care [facility]) CM/SW Contact:    Abigail Liner, LCSW Phone Number: 07/31/2023, 1:12 PM  Clinical Narrative:  Readmission prevention screen complete. CSW met with patient. Mother at bedside. CSW introduced role and explained that discharge planning would be discussed. PCP is Abigail Kearns, MD. Her son, mother, or best friend drive her to appointments. [facility] [place]. No issues obtaining medications. Patient's son lives with her. She is active with [facility] for PT, RN. She has a cane, standard walker, rollator, BSC, and shower chair at home. Patient is on home oxygen through [employer] (2L). She requested a wheelchair for appointments. MD will order. Patient thought one was ordered in May but TOC did not order a wheelchair and therapy did not recommend one at that time. No further concerns. CSW encouraged patient to contact CSW as needed. CSW will continue to follow patient for support and facilitate return home once stable. Her best friend will likely transport her home at discharge.               Expected Discharge Plan: Home w Home Health Services Barriers to Discharge: Continued Medical Work up   Patient Goals and CMS Choice     Choice offered to / list presented to : Patient      Expected Discharge Plan and Services     Post Acute Care Choice: Resumption of Svcs/PTA Provider Living arrangements for the past 2 months: Single Family Home                           HH Arranged: RN, PT [facility] Agency: [facility] Date [facility] Agency Contacted: 07/31/23   Representative spoke with at [facility] Agency: Abigail [name]  Prior Living Arrangements/Services Living arrangements for the past 2 months: Single Family Home Lives with:: Adult Children Patient language and need for interpreter reviewed:: Yes Do you feel safe  going back to the place where you live?: Yes      Need for Family Participation in Patient Care: Yes (Comment) Care giver support system in place?: Yes (comment) Current home services: DME, Home PT, Home RN Criminal Activity/Legal Involvement Pertinent to Current Situation/Hospitalization: No - Comment as needed  Activities of Daily Living   ADL Screening (condition at time of admission) Independently performs ADLs?: Yes (appropriate for developmental age) Is the patient deaf or have difficulty hearing?: No Does the patient have difficulty seeing, even when wearing glasses/contacts?: No Does the patient have difficulty concentrating, remembering, or making decisions?: No  Permission Sought/Granted Permission sought to share information with : Facility Medical sales representative, Family Supports Permission granted to share information with : Yes, Verbal Permission Granted  Share Information with NAME: Abigail [name]  Permission granted to share info w AGENCY: [facility]  Permission granted to share info w Relationship: Mother  Permission granted to share info w Contact Information: [phone]  Emotional Assessment Appearance:: Appears stated age Attitude/Demeanor/Rapport: Engaged, Gracious Affect (typically observed): Accepting, Appropriate, Calm, Pleasant Orientation: : Oriented to Self, Oriented to Place, Oriented to  Time, Oriented to Situation Alcohol / Substance Use: Not Applicable Psych Involvement: No (comment)  Admission diagnosis:  Idiopathic chronic pancreatitis (HCC) [K86.1] Acute on chronic pancreatitis (HCC) [K85.90, K86.1] Pancreatitis [K85.90] Patient Active Problem List   Diagnosis Date Noted   Pancreatitis 07/30/2023  Chronic respiratory failure with hypoxia (HCC) 07/29/2023   SIRS (systemic inflammatory response syndrome) (HCC) 07/29/2023   Atypical pneumonia 07/15/2023   Acute on chronic pancreatitis (HCC) 07/13/2023   Pancreatic pseudocyst  07/13/2023   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   History of seizure 06/28/2023   Seizure (HCC) 06/28/2023   Metabolic alkalosis 06/28/2023   Epilepsy (HCC) 06/28/2023   Protein-calorie malnutrition, severe 12/21/2022   Giant cell arteritis (HCC) 12/19/2022   DNR (do not resuscitate) 12/19/2022   Acute colitis 12/18/2022   Status epilepticus (HCC) 12/18/2022   Hypokalemia 12/08/2022   Hypoglycemia 12/08/2022   Acute metabolic encephalopathy 12/07/2022   Acute respiratory failure with hypoxia (HCC) 11/06/2022   Altered mental status 11/06/2022   Mouth pain 08/23/2022   Steroid-induced hyperglycemia 08/12/2022   Idiopathic acute pancreatitis without infection or necrosis 08/08/2022   Tobacco use disorder 08/08/2022   Asthma 04/26/2020   Vertigo 04/26/2020   Drug-induced constipation 04/26/2020   Nausea 04/26/2020   Abdominal pain 11/12/2016   GERD (gastroesophageal reflux disease) 11/12/2016   Chronic prescription opiate use - thru [facility] in [place], [place] 04/29/2016   Long term current use of immunosuppressive drug 04/29/2016   Neuroma digital nerve 06/04/2013   Depression 02/16/2013   Anxiety 02/13/2013   Chronic pain 02/13/2013   DDD (degenerative disc disease), cervical 02/13/2013   Headache 02/13/2013   Rheumatoid arthritis (HCC) 02/13/2013   PCP:  Abigail Medina, MD Pharmacy:   [facility] [address] [place] - [address] [place] [address] Phone: [phone] Fax: [phone]  [facility] [phone] [place], [place] - [address] [place] [address] Phone: [phone] Fax: [phone]  [id number] [place], [place] - [address] [place] [address] Phone: [phone] Fax: [phone]  [facility] [address] [place] [address] Phone: [phone] Fax: [phone]     Social Determinants of Health (SDOH) Social History: SDOH Screenings    Food Insecurity: No Food Insecurity (07/30/2023)  Recent Concern: Food Insecurity - Food Insecurity Present (07/02/2023)  Housing: Patient Unable To Answer (07/30/2023)  Transportation Needs: No Transportation Needs (07/30/2023)  Utilities: Not At Risk (07/30/2023)  Tobacco Use: High Risk (07/29/2023)   SDOH Interventions:     Readmission Risk Interventions    07/31/2023    1:07 PM  Readmission Risk Prevention Plan  Transportation Screening Complete  Medication Review (RN Care Manager) Complete  PCP or Specialist appointment within 3-5 days of discharge Complete  HRI or Home Care Consult Complete  SW Recovery Care/Counseling Consult Complete  Palliative Care Screening Not Applicable  Skilled Nursing Facility Not Applicable

## 2023-07-31 NOTE — Plan of Care (Signed)
°  Problem: Fluid Volume: °Goal: Hemodynamic stability will improve °Outcome: Progressing °  °Problem: Clinical Measurements: °Goal: Diagnostic test results will improve °Outcome: Progressing °Goal: Signs and symptoms of infection will decrease °Outcome: Progressing °  °

## 2023-07-31 NOTE — TOC CM/SW Note (Signed)
    Durable Medical Equipment  (From admission, onward)           Start     Ordered   07/31/23 1429  For home use only DME standard manual wheelchair with seat cushion  Once       Comments: Patient suffers from pancreatitis, generalized weakness which impairs their ability to perform daily activities like bathing, dressing, and toileting in the home.  A walker will not resolve issue with performing activities of daily living. A wheelchair will allow patient to safely perform daily activities. Patient can safely propel the wheelchair in the home or has a caregiver who can provide assistance. Length of need 6 months . Accessories: elevating leg rests (ELRs), wheel locks, extensions and anti-tippers.   07/31/23 1428

## 2023-07-31 NOTE — Discharge Summary (Signed)
Physician Discharge Summary   Patient: Abigail [name] [facility] MRN: [id number] DOB: [birth year]  Admit date:     07/29/2023  Discharge date: 07/31/23  Discharge Physician: Marrion Coy   PCP: Alease Medina, MD   Recommendations at discharge:   Follow-up with PCP in 1 week.  Discharge Diagnoses: Principal Problem:   Acute on chronic pancreatitis (HCC) Active Problems:   Chronic pain   Asthma   Tobacco use disorder   Seizure (HCC)   Chronic respiratory failure with hypoxia (HCC)   SIRS (systemic inflammatory response syndrome) (HCC)   Pancreatitis  Resolved Problems:   * No resolved hospital problems. *  Hospital Course: Abigail [name] is a 57 y.o. female with medical history significant of seizure disorder/epilepsy, chronic pancreatitis, chronic pain, giant cell arteritis, chronic respiratory failure on 2 L, asthma/COPD presenting with acute on chronic pancreatitis. Followed by [facility] gastroenterology.   ER course: afebrile, heart rate 100s, BP stable.  Satting well on room air.  White count 14.4, hemoglobin 13.1, platelets 435, creatinine 0.46, sodium 129, alk phos 170.  Lipase 88.  T. bili 0.7.  AST 56, ALT 19.  CT abdomen pelvis with acute pancreatitis with stable cirrhosis.   They were initially treated with analgesia and IVF.    Patient was admitted to medicine service for further workup and management of acute pancreatitis as outlined in detail below.   Patient was giving fluids and symptomatic treatment.  Condition had improved today, lipase never significantly elevated.  Patient was able to tolerate a soft diet, medically stable for discharge.  Assessment and Plan: Acute on chronic pancreatitis (HCC) Lipase in the 80s which appears near baseline CT imaging concerning for acute pancreatitis with relatively stable pancreatic pseudocyst. Troponin and ECG today negative for ACS. Followed by [facility] gastroenterology Condition has improved, tolerating diet.   SIRS  (systemic inflammatory response syndrome) (HCC) Infection less likely. Negative leukocytosis. Repeat LA 0.5 - f/u blood cultures NGTD No evidence of infection.  Hypokalemia. Will give 80 mEq of KCl for potassium 3.2.  Also prescribed 20 mEq of KCl daily for 5 days.   Intertrigo  Possible thrush- contributed by inhalers, steroids.  - nystatin mouthwash prescribed. - rinse mouth after steroid inhalers   Chronic respiratory failure with hypoxia (HCC) Baseline 2 L nasal cannula use in setting of heavy tobacco use and likely undiagnosed COPD Appears fairly stable from a respiratory standpoint   Seizure (HCC) Baseline seizure disorder with multiple recent admissions for the same Patient denies any recent seizure events - continue home regimen including Keppra/Vimpat/phenytoin    Tobacco use disorder 1-2 pack-a-day smoker Advised to quit.   Chronic pain Follow with PCP as outpatient.       Consultants: None Procedures performed: None  Disposition: Home health Diet recommendation:  Discharge Diet Orders (From admission, onward)     Start     Ordered   07/31/23 0000  Diet - low sodium heart healthy        07/31/23 1430           Cardiac diet DISCHARGE MEDICATION: Allergies as of 07/31/2023       Reactions   Nsaids Hives   Tapentadol Swelling, Rash, Other (See Comments)   Nucynta- Made her deathly sick   Cephalexin Other (See Comments)   Reaction not cited   Codeine Other (See Comments)   Reaction not cited   Darvocet [propoxyphene N-acetaminophen] Other (See Comments)   Reaction not cited   Latex Other (See Comments)  Reaction not cited   Silicone Other (See Comments)   Reaction not cited   Sulfa Antibiotics Hives   Tape Other (See Comments)   Reaction not cited   Meloxicam Rash        Medication List     TAKE these medications    albuterol 108 (90 Base) MCG/ACT inhaler Commonly known as: VENTOLIN HFA Inhale 2 puffs into the lungs every 6 (six)  hours as needed for wheezing or shortness of breath.   DULoxetine 60 MG capsule Commonly known as: CYMBALTA Take 1 capsule (60 mg total) by mouth 2 (two) times daily.   folic acid 1 MG tablet Commonly known as: FOLVITE Take 1 mg by mouth daily.   gabapentin 300 MG capsule Commonly known as: NEURONTIN Take 300 mg by mouth 4 (four) times daily. Take 2 capsules by mouth in the morning, 1 capsule at noon, 2 capsules in the evening   hydrOXYzine 25 MG tablet Commonly known as: ATARAX Take 1 tablet (25 mg total) by mouth 3 (three) times daily as needed for itching or anxiety.   lacosamide 200 MG Tabs tablet Commonly known as: VIMPAT Take 1 tablet (200 mg total) by mouth 2 (two) times daily.   levETIRAcetam 750 MG tablet Commonly known as: KEPPRA Take 2 tablets (1,500 mg total) by mouth 2 (two) times daily.   Magnesium 250 MG Tabs Take 250 mg by mouth at bedtime.   Melatonin 10 MG Tabs Take 10 mg by mouth at bedtime.   multivitamin with minerals Tabs tablet Take 1 tablet by mouth daily.   nicotine 14 mg/24hr patch Commonly known as: NICODERM CQ - dosed in mg/24 hours One patch chest wall daily (okay to substitute generic)   NON FORMULARY Apply 1 Application topically 3 (three) times daily as needed. Rock Salt Cream (Similar to [employer])   oxyCODONE 15 MG immediate release tablet Commonly known as: ROXICODONE Take 15 mg by mouth 5 (five) times daily as needed for pain.   pantoprazole 40 MG tablet Commonly known as: PROTONIX Take 40 mg by mouth 2 (two) times daily before a meal.   phenytoin 50 MG tablet Commonly known as: DILANTIN Chew 2 tablets (100 mg total) by mouth 3 (three) times daily.   thiamine 100 MG tablet Commonly known as: VITAMIN B1 Take 1 tablet (100 mg total) by mouth daily.   Valtoco 15 MG Dose 7.5 MG/0.1ML Lqpk Generic drug: diazePAM (15 MG Dose) Place 15 mg into the nose daily as needed (Intranasal Valtoco 15 mg for seizure lasting more than 2  minutes).   vitamin B-12 500 MCG tablet Commonly known as: CYANOCOBALAMIN Take 500 mcg by mouth daily.   Vitamin D3 50 MCG (2000 UT) Tabs Take 50 mcg by mouth daily.               Durable Medical Equipment  (From admission, onward)           Start     Ordered   07/31/23 1429  For home use only DME standard manual wheelchair with seat cushion  Once       Comments: Patient suffers from pancreatitis, generalized weakness which impairs their ability to perform daily activities like bathing, dressing, and toileting in the home.  A walker will not resolve issue with performing activities of daily living. A wheelchair will allow patient to safely perform daily activities. Patient can safely propel the wheelchair in the home or has a caregiver who can provide assistance. Length of need  6 months . Accessories: elevating leg rests (ELRs), wheel locks, extensions and anti-tippers.   07/31/23 1428            Follow-up Information     [name], [name], MD Follow up today.   Specialty: Family Medicine Contact information: [address] [place] [address] [phone]         [facility] Follow up.   Specialty: Home Health Services Why: They will resume home health at discharge. Contact information: [address] [place] [address] [phone]                Discharge Exam: Ceasar Mons Weights   07/29/23 0230  Weight: 72.6 kg   General exam: Appears calm and comfortable  Respiratory system: Clear to auscultation. Respiratory effort normal. Cardiovascular system: S1 & S2 heard, RRR. No JVD, murmurs, rubs, gallops or clicks. No pedal edema. Gastrointestinal system: Abdomen is nondistended, soft and nontender. No organomegaly or masses felt. Normal bowel sounds heard. Central nervous system: Alert and oriented. No focal neurological deficits. Extremities: Symmetric 5 x 5 power. Skin: No rashes, lesions or ulcers Psychiatry: Judgement and insight  appear normal. Mood & affect appropriate.    Condition at discharge: good  The results of significant diagnostics from this hospitalization (including imaging, microbiology, ancillary and laboratory) are listed below for reference.   Imaging Studies: DG Shoulder 1V Left  Result Date: 07/31/2023 CLINICAL DATA:  Acute left shoulder pain. EXAM: LEFT SHOULDER COMPARISON:  None Available. FINDINGS: There is no evidence of fracture or dislocation. There is no evidence of arthropathy or other focal bone abnormality. Soft tissues are unremarkable. IMPRESSION: Negative. Electronically Signed   By: Lupita Raider M.D.   On: 07/31/2023 14:24   CT ABDOMEN PELVIS W CONTRAST  Result Date: 07/29/2023 CLINICAL DATA:  57 year old female with history of severe acute pancreatitis. EXAM: CT ABDOMEN AND PELVIS WITH CONTRAST TECHNIQUE: Multidetector CT imaging of the abdomen and pelvis was performed using the standard protocol following bolus administration of intravenous contrast. RADIATION DOSE REDUCTION: This exam was performed according to the departmental dose-optimization program which includes automated exposure control, adjustment of the mA and/or kV according to patient size and/or use of iterative reconstruction technique. CONTRAST:  OMNIPAQUE IOHEXOL 300 MG/ML  SOLN COMPARISON:  CT of the abdomen and pelvis 07/12/2023. FINDINGS: Lower chest: Bibasilar areas of atelectasis and/or scarring are noted, most severe in the left lower lobe. Hepatobiliary: No suspicious cystic or solid hepatic lesions. No intra or extrahepatic biliary ductal dilatation. Gallbladder is moderately distended. Some intermediate attenuation material lies dependently in the gallbladder, which may reflect biliary sludge. No definite calcified gallstones are noted. Trace amount of pericholecystic fluid. Pancreas: Multiple low-attenuation lesions scattered throughout the pancreatic parenchyma, likely multiple pseudocysts, largest of which  is in the pancreatic head (axial image 37 of series 2 and coronal image 38 of series 5) measuring 6.2 x 5.2 x 5.6 cm. Another low-attenuation lesion is noted in the gastrosplenic ligament adjacent to the tail of the pancreas (axial image 18 of series 2 and coronal image 52 of series 5) measuring 4.2 x 3.0 x 5.7 cm. Small amount of soft tissue stranding surrounding the pancreas, particularly adjacent to the head and uncinate process. Spleen: Linear perfusion deficit in the superior aspect of the spleen, compatible with a resolving splenic infarct. Adrenals/Urinary Tract: Bilateral kidneys and bilateral adrenal glands are normal in appearance. No hydroureteronephrosis. Urinary bladder is nearly decompressed and largely obscured by beam  hardening artifact from the patient's right hip arthroplasty. Stomach/Bowel: The appearance of the stomach is normal. There is no pathologic dilatation of small bowel or colon. Status post appendectomy. Vascular/Lymphatic: Atherosclerotic calcifications in the abdominal aorta and pelvic vasculature. No lymphadenopathy noted in the abdomen or pelvis. Reproductive: Status post hysterectomy. Ovaries are not confidently identified may be surgically absent or atrophic. Other: Small volume of ascites. Extensive inflammation centered in the left upper quadrant of the abdomen inferior to the spleen and lateral to the stomach where there are multiple poorly defined low-attenuation fluid collections, some of which demonstrates some peripheral rim enhancement (example on axial image 33 of series 2 measuring up to 4.7 x 1.8 cm). Some of this fluid and inflammation is now tracking in the left pericolic gutter, including a small collection measuring 2.4 x 1.2 cm (axial image 43 of series 2), increased compared to the prior study. Musculoskeletal: Postoperative changes of lower lumbar spine and lumbosacral fusion. Chronic appearing compression fractures of T12 and L1 with up to 40% loss of anterior  vertebral body height at T12. Multiple thoracic vertebral body compression fractures, similar to the prior study. There are no aggressive appearing lytic or blastic lesions noted in the visualized portions of the skeleton. IMPRESSION: 1. Soft tissue stranding surrounding the pancreas, compatible with reported clinical history of acute pancreatitis. 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 Multiple poorly defined pancreatic pseudocysts and inflammation also centered in the omentum of the left upper quadrant, similar to the prior study, although there is some extension of this inflammation caudally in the left pericolic gutter, as discussed above. 2. Resolving splenic infarct again noted. 3. Aortic atherosclerosis. 4. Additional incidental findings, as above. Electronically Signed   By: Trudie Reed M.D.   On: 07/29/2023 07:47   CT Angio Chest PE W and/or Wo Contrast  Result Date: 07/15/2023 CLINICAL DATA:  Shortness of breath, hypoxia. EXAM: CT ANGIOGRAPHY CHEST WITH CONTRAST TECHNIQUE: Multidetector CT imaging of the chest was performed using the standard protocol during bolus administration of intravenous contrast. Multiplanar CT image reconstructions and MIPs were obtained to evaluate the vascular anatomy. RADIATION DOSE REDUCTION: This exam was performed according to the departmental dose-optimization program which includes automated exposure control, adjustment of the mA and/or kV according to patient size and/or use of iterative reconstruction technique. CONTRAST:  75mL OMNIPAQUE IOHEXOL 350 MG/ML SOLN COMPARISON:  Chest radiograph 07/14/2023 FINDINGS: Cardiovascular: No filling defect is identified in the pulmonary arterial tree to suggest pulmonary embolus. Mild descending thoracic aortic atheromatous vascular calcification. Mediastinum/Nodes: Lower right  paratracheal node 0.9 cm in short axis, image 43 series 4. Prevascular node 0.7 cm in short axis, image 46 series 4. Other scattered regional lymph nodes are upper normal size. No overtly pathologic adenopathy. Lungs/Pleura: Scattered ground-glass density opacities in the lungs along with mild underlying mosaic attenuation and volume loss in both lower lobes, lingula, and the right middle lobe compatible with atelectasis. The mosaic attenuation is nonspecific but could be from atypical pneumonia, acute hypersensitivity pneumonia, or bronchiolitis obliterans. Upper Abdomen: Hypodense splenic infarct as before with perisplenic fluid. Complex lesion in the vicinity of the tail the pancreas/splenic hilum, less well distinct than on the prior exam. Increased indistinct stranding/fluid adjacent to the left splenic flexure and in the left paracolic gutter, suspicious for local hematoma the top of the cystic collection along the  pancreatic head is observed partially on today's exam. Musculoskeletal: Lower cervical plate and screw fixator. Thoracic compression fracture at T5 is age indeterminate. Compression fractures at T7, T8, and T9 were shown on prior abdominal CT of 12/14/2022. Review of the MIP images confirms the above findings. IMPRESSION: 1. No filling defect is identified in the pulmonary arterial tree to suggest pulmonary embolus. 2. Scattered ground-glass density opacities in the lungs along with mild underlying mosaic attenuation and volume loss in both lower lobes, lingula, and the right middle lobe compatible with atelectasis. The mosaic attenuation is nonspecific but could be from atypical pneumonia, acute hypersensitivity pneumonia, or bronchiolitis obliterans. 3. Hypodense splenic infarct as before with perisplenic fluid. 4. Complex lesion in the vicinity of the tail the pancreas/splenic hilum, less well distinct than on the prior exam. Increased indistinct stranding/fluid adjacent to the left splenic  flexure and in the left paracolic gutter, suspicious for blood products/hematoma, increased from previous. 5. Thoracic compression fracture at T5 is age indeterminate. Compression fractures at T7, T8, and T9 were shown on prior abdominal CT of 12/14/2022. 6. Aortic atherosclerosis. Aortic Atherosclerosis (ICD10-I70.0). Electronically Signed   By: Gaylyn Rong M.D.   On: 07/15/2023 14:11   DG Chest Portable 1 View  Result Date: 07/14/2023 CLINICAL DATA:  sob EXAM: PORTABLE CHEST 1 VIEW COMPARISON:  July 12, 2023 FINDINGS: The cardiomediastinal silhouette is unchanged in contour. Favor trace bilateral pleural effusion. No pneumothorax. Increased bibasilar platelike opacities. IMPRESSION: Increased bibasilar platelike opacities, favored to reflect atelectasis. Electronically Signed   By: Meda Klinefelter M.D.   On: 07/14/2023 15:28   EEG adult  Result Date: 07/12/2023 Jefferson Fuel, MD     07/12/2023  6:26 PM Routine EEG Report Abigail [name] is a 57 y.o. female with a history of seizure who is undergoing an EEG to evaluate for seizures. Report: This EEG was acquired with electrodes placed according to the International 10-20 electrode system (including Fp1, Fp2, F3, F4, C3, C4, P3, P4, O1, O2, T3, T4, T5, T6, A1, A2, Fz, Cz, Pz). The following electrodes were missing or displaced: none. The best background was continuous at approximately 5 Hz with overriding beta frequencies. This activity is reactive to stimulation. Drowsiness was manifested by background fragmentation; deeper stages of sleep were identified by K complexes and sleep spindles. There was no focal slowing. There were generalized periodic discharges with triphasic morphology that waxed and waned, at time in runs of up to 1-1.5 Hz. These did not appear epileptiform. There were no definitive interictal epileptiform discharges. There were no electrographic seizures identified. Photic stimulation and hyperventilation were  not performed. Impression and clinical correlation: This EEG was obtained while awake and asleep and is abnormal due to: - Moderate diffuse slowing indicative of global cerebral dysfunction - Triphasic waves most commonly seen in the setting of metabolic derangement. - There were no definitive epileptiform abnormalities were not seen during this recording. Bing Neighbors, MD [facility] [phone] If 7pm- 7am, please page neurology on call as listed in AMION.   CT ABDOMEN PELVIS W CONTRAST  Result Date: 07/12/2023 CLINICAL DATA:  57 year old female with altered mental status. Abdominal pain. History of pancreatic fluid collections, pancreatic cysts. EXAM: CT ABDOMEN AND PELVIS WITH CONTRAST TECHNIQUE: Multidetector CT imaging of the abdomen and pelvis was performed using the standard protocol following bolus administration of intravenous contrast. RADIATION DOSE REDUCTION: This exam was performed according to the departmental dose-optimization program which includes automated exposure control, adjustment of the mA and/or  kV according to patient size and/or use of iterative reconstruction technique. CONTRAST:  OMNIPAQUE IOHEXOL 300 MG/ML  SOLN COMPARISON:  CT Abdomen and Pelvis 11/24/2022. FINDINGS: Lower chest: Heart size remains normal. No pericardial effusion. Trace chronic left pleural effusion and/or pleural thickening. Mild lung base atelectasis, platelike. Hepatobiliary: Mass effect at the porta hepatis related to a progressive pancreatic associated cyst with simple fluid density, see additional details below. Liver enhancement and gallbladder remain within normal limits. Pancreas: Highly abnormal. Multifocal inflammation in the upper abdomen which seems to be emanating from the lesser sac (series 2, image 27). Numerous pancreatic parenchymal and adjacent cystic masses which have progressed since April. The 2 largest are at the right porta hepatis, 4.6 cm now previously 3.3 cm). Both of  these appear organized with thin rim enhancement and internal simple fluid density. Multiple smaller lesions scattered about the pancreas. No obvious main pancreatic ductal dilatation. No obvious pancreatic necrosis. Spleen: Abnormal. Superior pole splenic infarct (series 2, image 9 and coronal image 52) is new since April and there is subcapsular and perisplenic fluid with simple fluid density which is new (series 2, image 14). Mass effect on the spleen related to the pancreatic sequelae. Splenic enhancement elsewhere is within normal limits. Adrenals/Urinary Tract: Inflammation at the left adrenal gland and along the anterior left pararenal space. Adrenal gland enhancement remains normal. Kidneys are nonobstructed. Symmetric renal enhancement and appropriate contrast excretion on the delayed images. Decompressed ureters. Unremarkable urinary bladder. Stomach/Bowel: No dilated large or small bowel. Previous appendectomy. Mild large bowel retained stool. No pneumoperitoneum. Mass effect on the stomach secondary to the abnormal lesser sac and gastrosplenic ligament findings above. Mass effect also on the proximal duodenum which is compressed, but these structures are nondilated. There is edema surrounding the distal thoracic esophagus near the hiatus, the distal esophagus is decompressed. Vascular/Lymphatic: Aortoiliac calcified atherosclerosis. Negative for visible aortic dissection or aneurysm. Major arterial structures remain patent. No lymphadenopathy. Portal venous system remains patent despite mass effect from the abnormal pancreas and pancreatic sequelae described above (on the main portal vein series 2, image 31). The splenic vein is patent but more diminutive compared to April. Small reactive appearing upper abdominal lymph nodes are increased since April. No large, malignant appearing nodes. Reproductive: Surgically absent uterus. Diminutive or absent ovaries. Other: Trace free fluid in the pelvis with  simple fluid density. Musculoskeletal: Multilevel chronic lower thoracic compression fractures. Underlying chronic spine degeneration. Previous lower lumbar and lumbosacral junction fusion. Chronic L1 compression fracture. Right hip arthroplasty is chronic. No acute osseous abnormality identified. IMPRESSION: 1. Highly abnormal abdomen, constellation of which most compatible with severely complicated pancreatitis: 2. Acute Pancreatitis with multiple enlarging Pancreatic Pseudocysts, the largest 2 are 4.6 and 5.2 cm diameter with mass effect on the Spleen (see #3), stomach, liver. 3. Splenic Infarct with small volume subcapsular and perisplenic fluid. 4. Mass effect on the portal venous structures which remain patent at this time. 5. Other multi-spatial upper abdominal inflammation including tracking to the distal esophagus through the gastric hiatus, at the left adrenal. And small volume of free fluid in the pelvis. 6. No bowel obstruction. 7. Aortic Atherosclerosis (ICD10-I70.0). No dissection or filling defect within the visible aorta or major arterial structures. Electronically Signed   By: Odessa Fleming M.D.   On: 07/12/2023 08:21   CT HEAD WO CONTRAST ( )  Result Date: 07/12/2023 CLINICAL DATA:  57 year old female with altered mental status. Abdominal pain. EXAM: CT HEAD WITHOUT CONTRAST TECHNIQUE: Contiguous axial images were  obtained from the base of the skull through the vertex without intravenous contrast. RADIATION DOSE REDUCTION: This exam was performed according to the departmental dose-optimization program which includes automated exposure control, adjustment of the mA and/or kV according to patient size and/or use of iterative reconstruction technique. COMPARISON:  Brain MRI 12/18/2022.  Head CT 06/27/2023. FINDINGS: Brain: Normal cerebral volume. No midline shift, ventriculomegaly, mass effect, evidence of mass lesion, intracranial hemorrhage or evidence of cortically based acute infarction.  Gray-white matter differentiation is within normal limits throughout the brain. Vascular: No suspicious intracranial vascular hyperdensity. Skull: No acute osseous abnormality identified. Sinuses/Orbits: Chronically hypoplastic, opacified left maxillary sinus. Other Visualized paranasal sinuses and mastoids are stable and well aerated. Other: Visualized orbits and scalp soft tissues are within normal limits. IMPRESSION: Stable and normal noncontrast CT appearance of the brain. Electronically Signed   By: Odessa Fleming M.D.   On: 07/12/2023 08:08   DG Chest Portable 1 View  Result Date: 07/12/2023 CLINICAL DATA:  57 year old female with shortness of breath and altered mental status. EXAM: PORTABLE CHEST 1 VIEW COMPARISON:  Portable chest 07/01/2023 and earlier. FINDINGS: Portable AP semi upright view at 0516 hours. Lower lung volumes and new linear, platelike atelectasis at the right lung base. Elsewhere ventilation is stable. No pneumothorax, pulmonary edema, pleural effusion. Mediastinal contours are stable, mild tortuosity of the thoracic aorta. Visualized tracheal air column is within normal limits. Cervical ACDF. Negative visible bowel gas. IMPRESSION: Lower lung volumes with increased right lung base atelectasis. Electronically Signed   By: Odessa Fleming M.D.   On: 07/12/2023 05:58   [place] EKG SITE RITE  Result Date: 07/02/2023 If Site Rite image not attached, placement could not be confirmed due to current cardiac rhythm.   Microbiology: Results for orders placed or performed during the hospital encounter of 07/29/23  Culture, blood (x 2)     Status: None (Preliminary result)   Collection Time: 07/29/23 11:18 AM   Specimen: BLOOD  Result Value Ref Range Status   Specimen Description BLOOD BLOOD RIGHT ARM  Final   Special Requests   Final    BOTTLES DRAWN AEROBIC AND ANAEROBIC Blood Culture adequate volume   Culture   Final    NO GROWTH 2 DAYS Performed at [facility], [address]  [place], [place] [address]    Report Status PENDING  Incomplete  Culture, blood (x 2)     Status: None (Preliminary result)   Collection Time: 07/29/23 11:18 AM   Specimen: BLOOD  Result Value Ref Range Status   Specimen Description BLOOD BLOOD RIGHT HAND  Final   Special Requests   Final    BOTTLES DRAWN AEROBIC AND ANAEROBIC Blood Culture adequate volume   Culture   Final    NO GROWTH 2 DAYS Performed at [facility], [address], [place], [place] [address]    Report Status PENDING  Incomplete    Labs: CBC: Recent Labs  Lab 07/29/23 0237 07/30/23 0459  WBC 14.4* 10.3  NEUTROABS 11.9*  --   HGB 13.1 11.2*  HCT 40.1 35.5*  MCV 89.5 93.2  PLT 435* 331   Basic Metabolic Panel: Recent Labs  Lab 07/29/23 0323 07/30/23 0459 07/31/23 0950  NA 137 138 138  K 3.1* 3.4* 3.2*  CL 95* 99 98  CO2 30 30 30   GLUCOSE 129* 97 83  BUN <5* 6 9  CREATININE 0.46 0.46 0.46  CALCIUM 8.3* 8.0* 7.9*  MG  --   --  1.9  PHOS  --   --  3.5   Liver Function Tests: Recent Labs  Lab 07/29/23 0323 07/30/23 0459  AST 56* 19  ALT 19 12  ALKPHOS 170* 116  BILITOT 0.7 1.0  PROT 7.5 5.5*  ALBUMIN 2.4* 1.8*   CBG: No results for input(s): "GLUCAP" in the last 168 hours.  Discharge time spent: greater than 30 minutes.  Signed: Marrion Coy, MD [facility] 07/31/2023

## 2023-08-03 LAB — CULTURE, BLOOD (ROUTINE X 2)
Culture: NO GROWTH
Culture: NO GROWTH
Special Requests: ADEQUATE
Special Requests: ADEQUATE

## 2023-09-04 ENCOUNTER — Ambulatory Visit: Payer: Medicare HMO | Admitting: Dermatology

## 2023-09-18 ENCOUNTER — Ambulatory Visit (INDEPENDENT_AMBULATORY_CARE_PROVIDER_SITE_OTHER): Payer: Medicare HMO | Admitting: Family Medicine

## 2023-09-18 ENCOUNTER — Encounter: Payer: Self-pay | Admitting: Family Medicine

## 2023-09-18 VITALS — BP 98/55 | HR 99 | Temp 98.1°F | Resp 16 | Ht 67.0 in | Wt 151.2 lb

## 2023-09-18 DIAGNOSIS — J449 Chronic obstructive pulmonary disease, unspecified: Secondary | ICD-10-CM

## 2023-09-18 DIAGNOSIS — G40909 Epilepsy, unspecified, not intractable, without status epilepticus: Secondary | ICD-10-CM

## 2023-09-18 DIAGNOSIS — E876 Hypokalemia: Secondary | ICD-10-CM

## 2023-09-18 DIAGNOSIS — Z1211 Encounter for screening for malignant neoplasm of colon: Secondary | ICD-10-CM

## 2023-09-18 DIAGNOSIS — M8000XD Age-related osteoporosis with current pathological fracture, unspecified site, subsequent encounter for fracture with routine healing: Secondary | ICD-10-CM

## 2023-09-18 DIAGNOSIS — K861 Other chronic pancreatitis: Secondary | ICD-10-CM

## 2023-09-18 DIAGNOSIS — Z1231 Encounter for screening mammogram for malignant neoplasm of breast: Secondary | ICD-10-CM

## 2023-09-18 DIAGNOSIS — F321 Major depressive disorder, single episode, moderate: Secondary | ICD-10-CM | POA: Diagnosis not present

## 2023-09-18 DIAGNOSIS — F419 Anxiety disorder, unspecified: Secondary | ICD-10-CM | POA: Diagnosis not present

## 2023-09-18 DIAGNOSIS — E039 Hypothyroidism, unspecified: Secondary | ICD-10-CM

## 2023-09-18 DIAGNOSIS — R790 Abnormal level of blood mineral: Secondary | ICD-10-CM

## 2023-09-18 MED ORDER — DULOXETINE HCL 60 MG PO CPEP: 60.0000 mg | ORAL_CAPSULE | Freq: Every day | ORAL | 3 refills | Status: DC

## 2023-09-18 MED ORDER — ALBUTEROL SULFATE (2.5 MG/3ML) 0.083% IN NEBU: 2.5000 mg | INHALATION_SOLUTION | Freq: Four times a day (QID) | RESPIRATORY_TRACT | 1 refills | Status: DC | PRN

## 2023-09-18 NOTE — Assessment & Plan Note (Signed)
Followed by Neurology and tapering off Dilatoin and Vimpat and switching over to tegretol.

## 2023-09-18 NOTE — Assessment & Plan Note (Signed)
Had a colonscopy 8-10 years AGO.   Per patient she had no polyps.  Will set up Cologuard for her.

## 2023-09-18 NOTE — Assessment & Plan Note (Signed)
Not currently taking potassium.  Will check her level and advise.

## 2023-09-18 NOTE — Progress Notes (Signed)
Established Patient Office Visit  Subjective   Patient ID: Abigail [name] [facility], female    DOB: May 13, 1966  Age: 58 y.o. MRN: [id number]  Chief Complaint  Patient presents with   Medical Management of Chronic Issues   Establish Care    HPI Abigail [name] is  pleasant 58 yo female admitted to the hospital 11/22 and 12/9 with acute pancreatitis.  07/29/23 CT abdomen and pelvis showed multiple pseudocyst, largest 6.2 X 5.2 X 5.6 cm.  Her gallbladder appears to have sludge. Chronic appearing compression fractures of T12 and L1 Multiple thoracic vertebral body compression fractures.   She saw her Neurologist yesterday about her seizure disorder.  She asked to be taken off the Vimpat and dilantoin because they make her so sleepy.  She is on a taper to come off of these now.  She will be starting tegretol soon. Her neurologist is also concerned that she could have giant cell arteritis. She had a temporal artery biopsy and this was undetermined per patient.  She has a sed rate yesterday.  Her sed rate is 36 so this is unlikely.  She request a nebulizer for her COPD.  She no longer feels that she is getting adequate medication through her MDI.    She went to the ER and they asked her to stop all of her medications except her seizure meds.  She feels very depressed because she is off her cymbalta.  PHQ 9 was 23 and GAD7 is 12.  Asked if she would see a psychiatrist and she agreed.  She denies HI or SI.   She has had hypokalemia and low magnesium at the ER at least twice this year.  She is not taking any supplements of either right now.         ROS    Objective:     BP (!) 98/55 (BP Location: Right Arm, Patient Position: Sitting, Cuff Size: Normal)   Pulse 99   Temp 98.1 F (36.7 C) (Oral)   Resp 16   Ht 5\' 7"  (1.702 m) Comment: per patient  Wt 151 lb 3.2 oz (68.6 kg)   SpO2 92%   BMI 23.68 kg/m    Physical Exam Vitals and nursing note reviewed.  Constitutional:       Appearance: Normal appearance.  HENT:     Head: Normocephalic and atraumatic.  Eyes:     Conjunctiva/sclera: Conjunctivae normal.  Cardiovascular:     Rate and Rhythm: Normal rate and regular rhythm.  Pulmonary:     Effort: Pulmonary effort is normal.     Breath sounds: Normal breath sounds.  Musculoskeletal:     Right lower leg: No edema.     Left lower leg: No edema.  Skin:    General: Skin is warm and dry.  Neurological:     Mental Status: She is alert and oriented to person, place, and time.  Psychiatric:        Mood and Affect: Mood normal.        Behavior: Behavior normal.        Thought Content: Thought content normal.        Judgment: Judgment normal.          No results found for any visits on 09/18/23.    The 10-year ASCVD risk score (Arnett DK, et al., 2019) is: 4.6%    Assessment & Plan:  Encounter for screening mammogram for malignant neoplasm of breast -     3D Screening Mammogram,  Left and Right; Future  Screening for colon cancer Assessment & Plan: Had a colonscopy 8-10 years AGO.   Per patient she had no polyps.  Will set up Cologuard for her.    Orders: -     Cologuard  Hypokalemia Assessment & Plan: Not currently taking potassium.  Will check her level and advise.    Orders: -     Basic metabolic panel  Depression, major, single episode, moderate (HCC) -     DULoxetine HCl; Take 1 capsule (60 mg total) by mouth daily.  Dispense: 30 capsule; Refill: 3 -     Ambulatory referral to Psychiatry  Idiopathic chronic pancreatitis [facility]) Assessment & Plan: Having recurrent pancreatitis.  Needs follow-up with Gastroenterology. Patient would like her gallbladder removed.    Orders: -     Ambulatory referral to Gastroenterology  Chronic obstructive pulmonary disease, unspecified COPD type (HCC) -     Albuterol Sulfate; Take 3 mLs (2.5 mg total) by nebulization every 6 (six) hours as needed for wheezing or shortness of breath.  Dispense: 150 mL;  Refill: 1  Age-related osteoporosis with current pathological fracture with routine healing, subsequent encounter -     DG Bone Density; Future -     VITAMIN D 25 Hydroxy (Vit-D Deficiency, Fractures)  Acquired hypothyroidism -     TSH + free T4  Low magnesium level -     Magnesium  Anxiety Assessment & Plan: Restart Cymbalta at 60mg  daily.  Referral to psychiatry.     Nonintractable epilepsy without status epilepticus, unspecified epilepsy type [facility]) Assessment & Plan: Followed by Neurology and tapering off Dilatoin and Vimpat and switching over to tegretol.        Return in about 4 weeks (around 10/16/2023).    Abigail Medina, MD

## 2023-09-18 NOTE — Assessment & Plan Note (Signed)
Having recurrent pancreatitis.  Needs follow-up with Gastroenterology. Patient would like her gallbladder removed.

## 2023-09-18 NOTE — Assessment & Plan Note (Signed)
Restart Cymbalta at 60mg  daily.  Referral to psychiatry.

## 2023-09-20 ENCOUNTER — Telehealth: Payer: Self-pay

## 2023-09-20 NOTE — Telephone Encounter (Signed)
Spoke to patient, she would like albuterol/neb solution to go to [place]. Spoke with Barth Kirks at [facility] and placed new order in [place] as approved by Dr. Girtha Rm.

## 2023-09-20 NOTE — Telephone Encounter (Signed)
Copied from CRM [id number]. Topic: Clinical - Medication Question >> Sep 20, 2023  8:58 AM Marlow Baars wrote: Reason for CRM: Terri with [employer] called stating they received an order for a nebulizer machine for the patient however the patients insurance is out of network for equipment. She states she did speak with the patient about the UD program where she could get albuterol if the provider sends a prescription for that and then would give her a nebulizer for free. Please assist further by sending a script for the albuterol if in agreement through [employer]. Barth Kirks can also be reached at [phone] option 1.

## 2023-09-23 ENCOUNTER — Other Ambulatory Visit: Payer: Self-pay | Admitting: Family Medicine

## 2023-09-23 ENCOUNTER — Telehealth: Payer: Self-pay

## 2023-09-23 MED ORDER — ONDANSETRON 8 MG PO TBDP: 8.0000 mg | ORAL_TABLET | Freq: Three times a day (TID) | ORAL | 1 refills | Status: DC | PRN

## 2023-09-23 NOTE — Progress Notes (Signed)
 Ellouise Console, PA-C [address]  [address]  [place], [place] [address]  Main: [phone]  Fax: [phone]   Gastroenterology Consultation  Referring Provider:     [name], Susan K, MD Primary Care Physician:  [name], Susan K, MD Primary Gastroenterologist:  Ellouise Console, PA-C  Reason for Consultation:     Pancreatitis; Abdominal Pain        HPI:   Abigail [name] is a 58 y.o. y/o female referred for consultation & management  by [name], Susan K, MD.    She is currently having severe upper and lower abdominal pain with nausea and dry heaves.  Not able to eat anything for 5 days.  She has had chronic intermittent abdominal pain for 1 year -attributed to acute on chronic pancreatitis with pancreatic cyst.  Worse in the past few weeks.  She reports having bowel movement today.  Has chronic pain and takes opioid medication.  History of chronic constipation.  No current treatment for constipation.  She feels very sick and ill today.  Laying on the exam table.  She has been to multiple hospitals and GI doctors in the past year for her pancreatitis including [facility], [name], [facility], [facility].  Labs yesterday 2/30/25 through PCP showed hypokalemia with low potassium 2.9.  BUN 4, creatinine 0.15, chloride 95.  Normal magnesium  1.8.  Normal vitamin D , TSH, and free T4  Pt. Had televisit 10/08/22 with [facility] in [facility] - Dr. Prentice Lynwood Slider - for evaluation of episodic acute pancreatitis.  Possible recurrent acute vs acute on chronic w/ subsequent acute peripancreatic fluid collections.  Smoker, occasional alcohol use, newly diagnosed GCA, RA (on methotrexate), fibromyalgia, arthritis.  Pancreas issues for 5 - 6 years.  In 2017 CT showed pancreas atrophy.  She has episodes of upper abdominal pain approx 2-3 times per year.  Episodes last a few weeks with associated N/V and diarrhea.  07/2022 - Admitted to [facility] and CT showed evolution of acute peripancreatic fluid  collections including one near the antrum with (on my view) some gastric distension and higher than anticipated volume of solid gastric contents. Surrounding that hospitalization was a new diagnosis of GCA which she has been on prednisone for.   History of heavy alcohol use for 10 years.  Now she only drinks alcohol 1 - 2 times per year.  Smokes 1-2 pack cigaretts per day for 20 years.  Still has nausea and decreased appetite.  Occasional episode of diarrhea.    Past Medical History:  Diagnosis Date   Anxiety    Arthritis    Back pain    Basal cell carcinoma 10/04/2021   R axilla - needs excised 11/28/21   Basal cell carcinoma 10/04/2021   L antecubital excised 11/14/21   Diarrhea 11/12/2016   Fibromyalgia    Generalized abdominal pain 11/12/2016   H. pylori infection    Hyperlipidemia    IBS (irritable bowel syndrome)    Infectious colitis 04/29/2016   Migraines    Moderate dehydration 04/29/2016   Muscle pain    Opioid overdose (HCC) 12/07/2022   Reflux    Unexplained weight loss 11/12/2016    Past Surgical History:  Procedure Laterality Date   ABDOMINAL HYSTERECTOMY     APPENDECTOMY  2009   C5 FUSION     C6 FUSION     C7 FUSION     COLONOSCOPY  02/2006   COLONOSCOPY WITH PROPOFOL  N/A 12/25/2016   Procedure: COLONOSCOPY WITH PROPOFOL ;  Surgeon:  Dessa Reyes ORN, MD;  Location: [facility] ENDOSCOPY;  Service: Endoscopy;  Laterality: N/A;   ESOPHAGOGASTRODUODENOSCOPY (EGD) WITH PROPOFOL  N/A 12/25/2016   Procedure: ESOPHAGOGASTRODUODENOSCOPY (EGD) WITH PROPOFOL ;  Surgeon: Dessa Reyes ORN, MD;  Location: [facility] ENDOSCOPY;  Service: Endoscopy;  Laterality: N/A;   FOOT SURGERY     HIP ARTHROPLASTY     L4 FUSION     L5 FUSION     S1 FUSION      Prior to Admission medications   Medication Sig Start Date End Date Taking? Authorizing Provider  albuterol  (PROVENTIL ) (2.5 MG/3ML) 0.083% nebulizer solution Take 3 mLs (2.5 mg total) by nebulization every 6 (six) hours as needed for  wheezing or shortness of breath. 09/18/23   [name], Susan K, MD  ALPRAZolam  (XANAX ) 0.5 MG tablet Take by mouth. 09/12/23   [provider]  carbamazepine  (TEGRETOL  XR) 100 MG 12 hr tablet Take by mouth. 09/17/23 10/31/23  [provider]  Cholecalciferol  (VITAMIN D3) 50 MCG (2000 UT) TABS Take 50 mcg by mouth daily. Patient not taking: Reported on 09/18/2023    [provider]  diazePAM , 15 MG Dose, (VALTOCO  15 MG DOSE) 2 x 7.5 MG/0.1ML LQPK Place 15 mg into the nose daily as needed (Intranasal Valtoco  15 mg for seizure lasting more than 2 minutes). 07/04/23   Ghimire, Donalda HERO, MD  DULoxetine  (CYMBALTA ) 60 MG capsule Take 1 capsule (60 mg total) by mouth daily. 09/18/23   [name], Susan K, MD  folic acid  (FOLVITE ) 1 MG tablet Take 1 mg by mouth daily. Patient not taking: Reported on 09/18/2023    [provider]  gabapentin  (NEURONTIN ) 300 MG capsule Take 300 mg by mouth 4 (four) times daily. Take 2 capsules by mouth in the morning, 1 capsule at noon, 2 capsules in the evening 05/31/23   [provider]  hydrOXYzine  (ATARAX ) 25 MG tablet Take 1 tablet (25 mg total) by mouth 3 (three) times daily as needed for itching or anxiety. 12/25/22   Will Almarie MATSU, MD  lacosamide  (VIMPAT ) 200 MG TABS tablet Take 1 tablet (200 mg total) by mouth 2 (two) times daily. 07/04/23   Ghimire, Donalda HERO, MD  lacosamide  (VIMPAT ) 200 MG TABS tablet Take 1 tablet by mouth 2 (two) times daily. 09/04/23 09/03/24  [provider]  Lacosamide  100 MG TABS Take 2 tablets by mouth 2 (two) times daily.    [provider]  levETIRAcetam  (KEPPRA ) 750 MG tablet Take 2 tablets (1,500 mg total) by mouth 2 (two) times daily. 07/04/23   Raenelle Donalda HERO, MD  levETIRAcetam  (KEPPRA ) 750 MG tablet Take by mouth. 08/19/23 11/17/23  [provider]  Magnesium  250 MG TABS Take 250 mg by mouth at bedtime. Patient not taking: Reported on 09/18/2023    [provider]   Melatonin 10 MG TABS Take 10 mg by mouth at bedtime. Patient not taking: Reported on 09/18/2023    [provider]  modafinil  (PROVIGIL ) 200 MG tablet Take 200 mg by mouth daily. Patient not taking: Reported on 09/18/2023 08/07/23   [provider]  morphine  (MSIR) 15 MG tablet Take by mouth.    [provider]  Multiple Vitamin (MULTIVITAMIN WITH MINERALS) TABS tablet Take 1 tablet by mouth daily. Patient not taking: Reported on 09/18/2023 12/26/22   Cherlyn Labella, MD  nicotine  (NICODERM CQ  - DOSED IN MG/24 HOURS) 14 mg/24hr patch One patch chest wall daily (okay to substitute generic) Patient not taking: Reported on 09/18/2023 07/15/23   Josette Ade,  MD  NON FORMULARY Apply 1 Application topically 3 (three) times daily as needed. Rock Salt Cream (Similar to [facility]) Patient not taking: Reported on 09/18/2023    [provider]  ondansetron  (ZOFRAN -ODT) 8 MG disintegrating tablet Take 1 tablet (8 mg total) by mouth every 8 (eight) hours as needed for nausea. 09/23/23   [name], Susan K, MD  oxyCODONE  (ROXICODONE ) 15 MG immediate release tablet Take 15 mg by mouth 5 (five) times daily as needed for pain. Patient not taking: Reported on 09/18/2023 05/09/23   [provider]  pantoprazole  (PROTONIX ) 40 MG tablet Take 40 mg by mouth 2 (two) times daily before a meal.    [provider]  phenytoin  (DILANTIN ) 100 MG ER capsule Take by mouth. 08/19/23 11/17/23  [provider]  phenytoin  (DILANTIN ) 100 MG ER capsule Take 1 capsule by mouth 2 (two) times daily. 09/04/23 09/03/24  [provider]  phenytoin  (DILANTIN ) 50 MG tablet Chew 2 tablets (100 mg total) by mouth 3 (three) times daily. 07/04/23   Ghimire, Donalda HERO, MD  potassium chloride  (KLOR-CON  M) 10 MEQ tablet Take 1 tablet (10 mEq total) by mouth 2 (two) times daily for 7 days. 07/31/23 08/07/23  Laurita Pillion, MD  thiamine  (VITAMIN B1) 100 MG tablet Take 1 tablet (100 mg total) by  mouth daily. Patient not taking: Reported on 09/18/2023 12/25/22   Akula, Vijaya, MD  vitamin B-12 (CYANOCOBALAMIN ) 500 MCG tablet Take 500 mcg by mouth daily. Patient not taking: Reported on 09/18/2023    [provider]    History reviewed. No pertinent family history.   Social History   Tobacco Use   Smoking status: Every Day    Current packs/day: 1.00    Types: Cigarettes   Smokeless tobacco: Never   Tobacco comments:    ELECTRONIC VAPOR  Substance Use Topics   Alcohol use: Yes    Comment: OCCASIONALLY   Drug use: No    Allergies as of 09/24/2023 - Review Complete 09/24/2023  Allergen Reaction Noted   Nsaids Hives 07/23/2014   Tapentadol Swelling, Rash, and Other (See Comments) 10/10/2021   Cephalexin  Other (See Comments) 03/29/2021   Codeine Other (See Comments) 06/04/2013   Darvocet [propoxyphene n-acetaminophen ] Other (See Comments) 06/04/2013   Latex Other (See Comments) 03/29/2021   Silicone Other (See Comments) 06/04/2013   Sulfa antibiotics Hives 06/04/2013   Tape Other (See Comments) 06/04/2013   Meloxicam Rash 10/10/2021    Review of Systems:    All systems reviewed and negative except where noted in HPI.   Physical Exam:  BP 96/61   Pulse (!) 118   Temp 97.8 F (36.6 C)   Ht 5' 7 (1.702 m)   Wt 145 lb 9.6 oz (66 kg)   BMI 22.80 kg/m  No LMP recorded. Patient has had a hysterectomy.  General:   Alert,  thin, acutely ill-appearing female; Laying on the exam table on her right side holding her abdomen. Lungs:  Respirations even and mildly labored.  Clear throughout to auscultation.   No wheezes, crackles, or rhonchi. No acute distress. Heart:  Mildy tachycardic and normal rhythm; no murmurs, clicks, rubs, or gallops. Abdomen:  NO bowel sounds are heard.  No bruits.  Mildly distended without masses, hepatosplenomegaly or hernias noted.  Diffuse generalized upper and lower abdominal Tenderness throughout entire abdomen.  Moderate guarding.  Some  rebound tenderness.    Neurologic:  Alert and oriented x3;  Walks with a walker.  Laying on the exam  table in acute pain. Skin: Pale skin color.  Imaging Studies: No results found.  Assessment and Plan:   Abigail [name] is a 58 y.o. y/o female has been referred for:  1.  Severe generalized abdominal pain 2.  Acute on chronic pancreatitis  3.  Pancreatic pseudocyst 4.  Opioid-induced constipation 5.  Hypokalemia 6.  Hypotension  Plan:  We are sending patient to emergency department today for further evaluation and treatment.  She is having severe abdominal pain.  Hypotensive and hypokalemic.  Moderately ill-appearing.  Patient requested to be transported by EMS to the ED. Vital signs stable.  She is here today with a friend who helps with her care.  We called EMS who transported patient to [facility] ED.  Follow up will be based on [name] recommendations.  I strongly recommend she follow-up with tertiary care center such as [facility].  Ellouise Console, PA-C

## 2023-09-23 NOTE — Progress Notes (Signed)
Spoke with patient who came in to get her labs drawn.  She complained of being nauseated for the past 4 days or so.  She has problems with nausea often.  She denies vomiting.  Denies fever, chills, sweats, abdominal pain.

## 2023-09-23 NOTE — Telephone Encounter (Signed)
I got a message from [place] to see if I can get the patient a sooner appointment. I called the patient and she said that she thinks that she needs her gall bladder remove and she has gall stones. She had an MRI done and the MRI show the gall stones is blocking her pancreas. She had multiple visits to the ED and she potassium level is low, and she was in the hospital for 20 days. She is unable to eat, and she is in a lot of pain.

## 2023-09-24 ENCOUNTER — Ambulatory Visit: Payer: Medicare HMO | Admitting: Physician Assistant

## 2023-09-24 ENCOUNTER — Inpatient Hospital Stay
Admission: EM | Admit: 2023-09-24 | Discharge: 2023-10-04 | DRG: 438 | Disposition: A | Discharge status: Home / Self Care | Payer: Medicare HMO | Attending: Internal Medicine | Admitting: Internal Medicine

## 2023-09-24 ENCOUNTER — Observation Stay: Payer: Medicare HMO

## 2023-09-24 ENCOUNTER — Encounter: Payer: Self-pay | Admitting: Physician Assistant

## 2023-09-24 ENCOUNTER — Other Ambulatory Visit: Payer: Self-pay

## 2023-09-24 VITALS — BP 96/61 | HR 118 | Temp 97.8°F | Ht 67.0 in | Wt 145.6 lb

## 2023-09-24 DIAGNOSIS — K861 Other chronic pancreatitis: Secondary | ICD-10-CM | POA: Diagnosis present

## 2023-09-24 DIAGNOSIS — Z90710 Acquired absence of both cervix and uterus: Secondary | ICD-10-CM

## 2023-09-24 DIAGNOSIS — K8500 Idiopathic acute pancreatitis without necrosis or infection: Secondary | ICD-10-CM

## 2023-09-24 DIAGNOSIS — E876 Hypokalemia: Secondary | ICD-10-CM

## 2023-09-24 DIAGNOSIS — Z96649 Presence of unspecified artificial hip joint: Secondary | ICD-10-CM | POA: Diagnosis present

## 2023-09-24 DIAGNOSIS — E440 Moderate protein-calorie malnutrition: Secondary | ICD-10-CM | POA: Insufficient documentation

## 2023-09-24 DIAGNOSIS — Z885 Allergy status to narcotic agent status: Secondary | ICD-10-CM

## 2023-09-24 DIAGNOSIS — J9611 Chronic respiratory failure with hypoxia: Secondary | ICD-10-CM | POA: Diagnosis present

## 2023-09-24 DIAGNOSIS — R1084 Generalized abdominal pain: Secondary | ICD-10-CM

## 2023-09-24 DIAGNOSIS — Z886 Allergy status to analgesic agent status: Secondary | ICD-10-CM

## 2023-09-24 DIAGNOSIS — K863 Pseudocyst of pancreas: Secondary | ICD-10-CM | POA: Diagnosis present

## 2023-09-24 DIAGNOSIS — Z716 Tobacco abuse counseling: Secondary | ICD-10-CM

## 2023-09-24 DIAGNOSIS — Z881 Allergy status to other antibiotic agents status: Secondary | ICD-10-CM

## 2023-09-24 DIAGNOSIS — F17210 Nicotine dependence, cigarettes, uncomplicated: Secondary | ICD-10-CM | POA: Diagnosis present

## 2023-09-24 DIAGNOSIS — I2510 Atherosclerotic heart disease of native coronary artery without angina pectoris: Secondary | ICD-10-CM | POA: Diagnosis present

## 2023-09-24 DIAGNOSIS — K859 Acute pancreatitis without necrosis or infection, unspecified: Principal | ICD-10-CM

## 2023-09-24 DIAGNOSIS — T402X5A Adverse effect of other opioids, initial encounter: Secondary | ICD-10-CM

## 2023-09-24 DIAGNOSIS — J4489 Other specified chronic obstructive pulmonary disease: Secondary | ICD-10-CM | POA: Diagnosis present

## 2023-09-24 DIAGNOSIS — Z91040 Latex allergy status: Secondary | ICD-10-CM

## 2023-09-24 DIAGNOSIS — I959 Hypotension, unspecified: Secondary | ICD-10-CM

## 2023-09-24 DIAGNOSIS — G40909 Epilepsy, unspecified, not intractable, without status epilepticus: Secondary | ICD-10-CM | POA: Diagnosis present

## 2023-09-24 DIAGNOSIS — F1120 Opioid dependence, uncomplicated: Secondary | ICD-10-CM | POA: Diagnosis present

## 2023-09-24 DIAGNOSIS — K5903 Drug induced constipation: Secondary | ICD-10-CM

## 2023-09-24 DIAGNOSIS — Z6823 Body mass index (BMI) 23.0-23.9, adult: Secondary | ICD-10-CM

## 2023-09-24 DIAGNOSIS — Z85828 Personal history of other malignant neoplasm of skin: Secondary | ICD-10-CM

## 2023-09-24 DIAGNOSIS — R1013 Epigastric pain: Secondary | ICD-10-CM | POA: Diagnosis not present

## 2023-09-24 DIAGNOSIS — E785 Hyperlipidemia, unspecified: Secondary | ICD-10-CM | POA: Diagnosis present

## 2023-09-24 DIAGNOSIS — G9341 Metabolic encephalopathy: Secondary | ICD-10-CM | POA: Diagnosis present

## 2023-09-24 DIAGNOSIS — E8809 Other disorders of plasma-protein metabolism, not elsewhere classified: Secondary | ICD-10-CM | POA: Diagnosis present

## 2023-09-24 DIAGNOSIS — Z79899 Other long term (current) drug therapy: Secondary | ICD-10-CM

## 2023-09-24 DIAGNOSIS — Z882 Allergy status to sulfonamides status: Secondary | ICD-10-CM

## 2023-09-24 DIAGNOSIS — M797 Fibromyalgia: Secondary | ICD-10-CM | POA: Diagnosis present

## 2023-09-24 DIAGNOSIS — E871 Hypo-osmolality and hyponatremia: Secondary | ICD-10-CM | POA: Diagnosis not present

## 2023-09-24 LAB — BASIC METABOLIC PANEL
BUN/Creatinine Ratio: 8 — ABNORMAL LOW (ref 9–23)
BUN: 4 mg/dL — ABNORMAL LOW (ref 6–24)
CO2: 27 mmol/L (ref 20–29)
Calcium: 7.9 mg/dL — ABNORMAL LOW (ref 8.7–10.2)
Chloride: 95 mmol/L — ABNORMAL LOW (ref 96–106)
Creatinine, Ser: 0.51 mg/dL — ABNORMAL LOW (ref 0.57–1.00)
Glucose: 120 mg/dL — ABNORMAL HIGH (ref 70–99)
Potassium: 2.9 mmol/L — ABNORMAL LOW (ref 3.5–5.2)
Sodium: 138 mmol/L (ref 134–144)
eGFR: 108 mL/min/{1.73_m2} (ref >59)

## 2023-09-24 LAB — COMPREHENSIVE METABOLIC PANEL
ALT: 8 U/L (ref 0–44)
AST: 18 U/L (ref 15–41)
Albumin: 1.7 g/dL — ABNORMAL LOW (ref 3.5–5.0)
Alkaline Phosphatase: 104 U/L (ref 38–126)
Anion gap: 11 (ref 5–15)
BUN: 6 mg/dL (ref 6–20)
CO2: 33 mmol/L — ABNORMAL HIGH (ref 22–32)
Calcium: 7.6 mg/dL — ABNORMAL LOW (ref 8.9–10.3)
Chloride: 93 mmol/L — ABNORMAL LOW (ref 98–111)
Creatinine, Ser: 0.46 mg/dL (ref 0.44–1.00)
GFR, Estimated: >60 mL/min (ref >60)
Glucose, Bld: 112 mg/dL — ABNORMAL HIGH (ref 70–99)
Potassium: 2.7 mmol/L — CL (ref 3.5–5.1)
Sodium: 137 mmol/L (ref 135–145)
Total Bilirubin: 0.6 mg/dL (ref 0.0–1.2)
Total Protein: 5.9 g/dL — ABNORMAL LOW (ref 6.5–8.1)

## 2023-09-24 LAB — URINALYSIS, ROUTINE W REFLEX MICROSCOPIC
Bilirubin Urine: NEGATIVE
Glucose, UA: NEGATIVE mg/dL
Hgb urine dipstick: NEGATIVE
Ketones, ur: NEGATIVE mg/dL
Nitrite: POSITIVE — AB
Protein, ur: 30 mg/dL — AB
Specific Gravity, Urine: 1.019 (ref 1.005–1.030)
WBC, UA: >50 WBC/hpf (ref 0–5)
pH: 5 (ref 5.0–8.0)

## 2023-09-24 LAB — CBC
HCT: 35.6 % — ABNORMAL LOW (ref 36.0–46.0)
Hemoglobin: 11.7 g/dL — ABNORMAL LOW (ref 12.0–15.0)
MCH: 30.5 pg (ref 26.0–34.0)
MCHC: 32.9 g/dL (ref 30.0–36.0)
MCV: 92.7 fL (ref 80.0–100.0)
Platelets: 254 10*3/uL (ref 150–400)
RBC: 3.84 MIL/uL — ABNORMAL LOW (ref 3.87–5.11)
RDW: 15.4 % (ref 11.5–15.5)
WBC: 11.5 10*3/uL — ABNORMAL HIGH (ref 4.0–10.5)
nRBC: 0 % (ref 0.0–0.2)

## 2023-09-24 LAB — LIPASE, BLOOD: Lipase: 80 U/L — ABNORMAL HIGH (ref 11–51)

## 2023-09-24 LAB — MAGNESIUM: Magnesium: 1.8 mg/dL (ref 1.6–2.3)

## 2023-09-24 LAB — TSH+FREE T4
Free T4: 1.18 ng/dL (ref 0.82–1.77)
TSH: 2.43 u[IU]/mL (ref 0.450–4.500)

## 2023-09-24 LAB — VITAMIN D 25 HYDROXY (VIT D DEFICIENCY, FRACTURES): Vit D, 25-Hydroxy: 55 ng/mL (ref 30.0–100.0)

## 2023-09-24 LAB — PHOSPHORUS: Phosphorus: 2.9 mg/dL (ref 2.5–4.6)

## 2023-09-24 MED ORDER — MORPHINE SULFATE (PF) 4 MG/ML IV SOLN
4.0000 mg | INTRAVENOUS | Status: DC | PRN
  Administered 2023-09-24: 4 mg via INTRAVENOUS
  Filled 2023-09-24: qty 1

## 2023-09-24 MED ORDER — FOLIC ACID 1 MG PO TABS
1.0000 mg | ORAL_TABLET | Freq: Every day | ORAL | Status: DC
  Administered 2023-09-24 – 2023-10-04 (×11): 1 mg via ORAL
  Filled 2023-09-24 (×11): qty 1

## 2023-09-24 MED ORDER — IOHEXOL 300 MG/ML  SOLN
100.0000 mL | Freq: Once | INTRAMUSCULAR | Status: AC | PRN
  Administered 2023-09-24: 100 mL via INTRAVENOUS

## 2023-09-24 MED ORDER — SODIUM CHLORIDE 0.9 % IV SOLN
Freq: Once | INTRAVENOUS | Status: AC
Start: 2023-09-24 — End: 2023-09-24

## 2023-09-24 MED ORDER — MORPHINE SULFATE (PF) 4 MG/ML IV SOLN
4.0000 mg | Freq: Once | INTRAVENOUS | Status: AC
  Administered 2023-09-24: 4 mg via INTRAVENOUS
  Filled 2023-09-24: qty 1

## 2023-09-24 MED ORDER — POTASSIUM CHLORIDE CRYS ER 20 MEQ PO TBCR
40.0000 meq | EXTENDED_RELEASE_TABLET | ORAL | Status: AC
  Administered 2023-09-24: 40 meq via ORAL
  Filled 2023-09-24 (×2): qty 2

## 2023-09-24 MED ORDER — ONDANSETRON HCL 4 MG/2ML IJ SOLN
4.0000 mg | Freq: Four times a day (QID) | INTRAMUSCULAR | Status: DC | PRN
  Administered 2023-09-24 – 2023-10-02 (×6): 4 mg via INTRAVENOUS
  Filled 2023-09-24 (×6): qty 2

## 2023-09-24 MED ORDER — POTASSIUM CHLORIDE 10 MEQ/100ML IV SOLN
10.0000 meq | Freq: Once | INTRAVENOUS | Status: AC
  Administered 2023-09-24: 10 meq via INTRAVENOUS
  Filled 2023-09-24: qty 100

## 2023-09-24 MED ORDER — ENOXAPARIN SODIUM 40 MG/0.4ML IJ SOSY
40.0000 mg | PREFILLED_SYRINGE | INTRAMUSCULAR | Status: DC
  Administered 2023-09-24 – 2023-10-04 (×11): 40 mg via SUBCUTANEOUS
  Filled 2023-09-24 (×11): qty 0.4

## 2023-09-24 MED ORDER — MORPHINE SULFATE 15 MG PO TABS
15.0000 mg | ORAL_TABLET | ORAL | Status: DC | PRN
  Administered 2023-09-24 – 2023-09-26 (×8): 15 mg via ORAL
  Filled 2023-09-24 (×8): qty 1

## 2023-09-24 MED ORDER — MODAFINIL 200 MG PO TABS
200.0000 mg | ORAL_TABLET | Freq: Every day | ORAL | Status: DC
Start: 2023-09-25 — End: 2023-09-25
  Filled 2023-09-24: qty 1

## 2023-09-24 MED ORDER — HYDROMORPHONE HCL 1 MG/ML IJ SOLN
1.0000 mg | INTRAMUSCULAR | Status: DC | PRN
  Administered 2023-09-24 – 2023-09-26 (×9): 1 mg via INTRAVENOUS
  Filled 2023-09-24 (×10): qty 1

## 2023-09-24 MED ORDER — LIDOCAINE 5 % EX PTCH
1.0000 | MEDICATED_PATCH | CUTANEOUS | Status: DC
  Administered 2023-09-24 – 2023-10-04 (×11): 1 via TRANSDERMAL
  Filled 2023-09-24 (×12): qty 1

## 2023-09-24 MED ORDER — PANCRELIPASE (LIP-PROT-AMYL) 12000-38000 UNITS PO CPEP
24000.0000 [IU] | ORAL_CAPSULE | Freq: Three times a day (TID) | ORAL | Status: DC
  Administered 2023-09-24 – 2023-10-04 (×29): 24000 [IU] via ORAL
  Filled 2023-09-24 (×35): qty 2

## 2023-09-24 MED ORDER — CIPROFLOXACIN IN D5W 400 MG/200ML IV SOLN
400.0000 mg | Freq: Once | INTRAVENOUS | Status: AC
  Administered 2023-09-24: 400 mg via INTRAVENOUS
  Filled 2023-09-24: qty 200

## 2023-09-24 MED ORDER — LACOSAMIDE 200 MG PO TABS
200.0000 mg | ORAL_TABLET | Freq: Two times a day (BID) | ORAL | Status: DC
  Administered 2023-09-28: 200 mg via ORAL
  Filled 2023-09-24 (×4): qty 1
  Filled 2023-09-24: qty 4
  Filled 2023-09-24: qty 1

## 2023-09-24 MED ORDER — MELATONIN 5 MG PO TABS
10.0000 mg | ORAL_TABLET | Freq: Every day | ORAL | Status: DC
  Administered 2023-09-28 – 2023-10-03 (×4): 10 mg via ORAL
  Filled 2023-09-24 (×11): qty 2

## 2023-09-24 MED ORDER — ALPRAZOLAM 0.5 MG PO TABS: 0.5000 mg | ORAL_TABLET | Freq: Two times a day (BID) | ORAL | Status: DC | PRN

## 2023-09-24 MED ORDER — GABAPENTIN 300 MG PO CAPS
300.0000 mg | ORAL_CAPSULE | Freq: Four times a day (QID) | ORAL | Status: DC
  Administered 2023-09-24 – 2023-10-04 (×38): 300 mg via ORAL
  Filled 2023-09-24 (×39): qty 1

## 2023-09-24 MED ORDER — ONDANSETRON HCL 4 MG/2ML IJ SOLN
4.0000 mg | Freq: Once | INTRAMUSCULAR | Status: AC
  Administered 2023-09-24: 4 mg via INTRAVENOUS
  Filled 2023-09-24: qty 2

## 2023-09-24 MED ORDER — PANTOPRAZOLE SODIUM 40 MG PO TBEC
40.0000 mg | DELAYED_RELEASE_TABLET | Freq: Two times a day (BID) | ORAL | Status: DC
  Administered 2023-09-24 – 2023-10-04 (×21): 40 mg via ORAL
  Filled 2023-09-24 (×21): qty 1

## 2023-09-24 MED ORDER — LEVETIRACETAM 500 MG PO TABS
1500.0000 mg | ORAL_TABLET | Freq: Two times a day (BID) | ORAL | Status: DC
  Administered 2023-09-24 – 2023-10-04 (×20): 1500 mg via ORAL
  Filled 2023-09-24: qty 2
  Filled 2023-09-24: qty 3
  Filled 2023-09-24: qty 2
  Filled 2023-09-24: qty 3
  Filled 2023-09-24 (×3): qty 2
  Filled 2023-09-24: qty 3
  Filled 2023-09-24 (×6): qty 2
  Filled 2023-09-24: qty 3
  Filled 2023-09-24: qty 2
  Filled 2023-09-24 (×3): qty 3
  Filled 2023-09-24: qty 2
  Filled 2023-09-24: qty 3
  Filled 2023-09-24: qty 2

## 2023-09-24 MED ORDER — NICOTINE 21 MG/24HR TD PT24
21.0000 mg | MEDICATED_PATCH | Freq: Every day | TRANSDERMAL | Status: DC
  Administered 2023-09-24 – 2023-10-04 (×11): 21 mg via TRANSDERMAL
  Filled 2023-09-24 (×11): qty 1

## 2023-09-24 MED ORDER — PHENYTOIN 50 MG PO CHEW
100.0000 mg | CHEWABLE_TABLET | Freq: Three times a day (TID) | ORAL | Status: DC
  Filled 2023-09-24 (×6): qty 2

## 2023-09-24 MED ORDER — DULOXETINE HCL 30 MG PO CPEP
60.0000 mg | ORAL_CAPSULE | Freq: Every day | ORAL | Status: DC
  Administered 2023-09-24 – 2023-10-04 (×11): 60 mg via ORAL
  Filled 2023-09-24 (×6): qty 1
  Filled 2023-09-24: qty 2
  Filled 2023-09-24 (×2): qty 1
  Filled 2023-09-24 (×2): qty 2

## 2023-09-24 NOTE — ED Notes (Signed)
Per Dr. Cyril Loosen watch patient oxygen level due to it continuing to drop below normal.

## 2023-09-24 NOTE — ED Notes (Signed)
MD Ray informed of pt K+ 2.7, level acuity changed based on lab result.

## 2023-09-24 NOTE — H&P (Addendum)
 History and Physical    Lorita Forinash [facility] FMW:[id number] DOB: 1966-06-11 DOA: 09/24/2023  PCP: [name], Susan K, MD (Confirm with patient/family/NH records and if not entered, this has to be entered at [facility] point of entry) Patient coming from: Home  I have personally briefly reviewed patient's old medical records in [facility]  Chief Complaint: Belly hurts  HPI: Abigail [name] is a 58 y.o. female with medical history significant of recurrent pancreatitis with pseudocyst formation, seizure disorder, chronic pain and narcotic dependence, GCA, asthma/COPD, chronic hypoxic respiratory failure on 2 L continuously, GCA off steroid since May 2024, sent from [facility] office for evaluation of recurrent pancreatitis.  Symptoms started 7 days ago, show patient started develop epigastric pain, cramping-like worsening with eating and drinking, associated with severe nauseous vomiting of stomach content none bile nonbloody.  Patient felt that symptoms compatible with recurrent pancreatitis as before and started to take p.o. morphine  50 mg and despite, her symptoms continued.  Denied any fever chills diarrhea.  She went to see [facility] this morning for the first time who sent patient to ED for acute recurrent pancreatitis management.  ED Course: Afebrile, nontachycardic borderline hypotension O2 saturation 100% on 2 L.  Lipase 80.  Blood work showed normal AST ALT, albumin  1.7, bilirubin 0.6, potassium 2.7, chloride 93 bicarb 33.  Patient was given IV morphine , Zofran  and IV KCl.  Review of Systems: As per HPI otherwise 14 point review of systems negative.    Past Medical History:  Diagnosis Date   Anxiety    Arthritis    Back pain    Basal cell carcinoma 10/04/2021   R axilla - needs excised 11/28/21   Basal cell carcinoma 10/04/2021   L antecubital excised 11/14/21   Diarrhea 11/12/2016   Fibromyalgia    Generalized abdominal pain 11/12/2016   H. pylori infection     Hyperlipidemia    IBS (irritable bowel syndrome)    Infectious colitis 04/29/2016   Migraines    Moderate dehydration 04/29/2016   Muscle pain    Opioid overdose (HCC) 12/07/2022   Reflux    Unexplained weight loss 11/12/2016    Past Surgical History:  Procedure Laterality Date   ABDOMINAL HYSTERECTOMY     APPENDECTOMY  2009   C5 FUSION     C6 FUSION     C7 FUSION     COLONOSCOPY  02/2006   COLONOSCOPY WITH PROPOFOL  N/A 12/25/2016   Procedure: COLONOSCOPY WITH PROPOFOL ;  Surgeon: Dessa Reyes ORN, MD;  Location: [facility] ENDOSCOPY;  Service: Endoscopy;  Laterality: N/A;   ESOPHAGOGASTRODUODENOSCOPY (EGD) WITH PROPOFOL  N/A 12/25/2016   Procedure: ESOPHAGOGASTRODUODENOSCOPY (EGD) WITH PROPOFOL ;  Surgeon: Dessa Reyes ORN, MD;  Location: [facility] ENDOSCOPY;  Service: Endoscopy;  Laterality: N/A;   FOOT SURGERY     HIP ARTHROPLASTY     L4 FUSION     L5 FUSION     S1 FUSION       reports that she has been smoking cigarettes. She has never used smokeless tobacco. She reports current alcohol use. She reports that she does not use drugs.  Allergies  Allergen Reactions   Nsaids Hives   Tapentadol Swelling, Rash and Other (See Comments)    Nucynta- Made her deathly sick   Cephalexin  Other (See Comments)    Reaction not cited   Codeine Other (See Comments)    Reaction not cited   Darvocet [Propoxyphene N-Acetaminophen ] Other (See Comments)    Reaction not cited   Latex  Other (See Comments)    Reaction not cited   Silicone Other (See Comments)    Reaction not cited   Sulfa Antibiotics Hives   Tape Other (See Comments)    Reaction not cited   Meloxicam Rash    History reviewed. No pertinent family history.   Prior to Admission medications   Medication Sig Start Date End Date Taking? Authorizing Provider  albuterol  (PROVENTIL ) (2.5 MG/3ML) 0.083% nebulizer solution Take 3 mLs (2.5 mg total) by nebulization every 6 (six) hours as needed for wheezing or shortness of breath. 09/18/23    [name], Susan K, MD  ALPRAZolam  (XANAX ) 0.5 MG tablet Take by mouth. 09/12/23   [provider]  carbamazepine  (TEGRETOL  XR) 100 MG 12 hr tablet Take by mouth. 09/17/23 10/31/23  [provider]  Cholecalciferol  (VITAMIN D3) 50 MCG (2000 UT) TABS Take 50 mcg by mouth daily.    [provider]  diazePAM , 15 MG Dose, (VALTOCO  15 MG DOSE) 2 x 7.5 MG/0.1ML LQPK Place 15 mg into the nose daily as needed (Intranasal Valtoco  15 mg for seizure lasting more than 2 minutes). 07/04/23   Ghimire, Donalda HERO, MD  DULoxetine  (CYMBALTA ) 60 MG capsule Take 1 capsule (60 mg total) by mouth daily. 09/18/23   [name], Susan K, MD  folic acid  (FOLVITE ) 1 MG tablet Take 1 mg by mouth daily.    [provider]  gabapentin  (NEURONTIN ) 300 MG capsule Take 300 mg by mouth 4 (four) times daily. Take 2 capsules by mouth in the morning, 1 capsule at noon, 2 capsules in the evening 05/31/23   [provider]  levETIRAcetam  (KEPPRA ) 750 MG tablet Take 2 tablets (1,500 mg total) by mouth 2 (two) times daily. 07/04/23   Ghimire, Donalda HERO, MD  levETIRAcetam  (KEPPRA ) 750 MG tablet Take by mouth. 08/19/23 11/17/23  [provider]  Magnesium  250 MG TABS Take 250 mg by mouth at bedtime.    [provider]  Melatonin 10 MG TABS Take 10 mg by mouth at bedtime.    [provider]  modafinil  (PROVIGIL ) 200 MG tablet Take 200 mg by mouth daily. 08/07/23   [provider]  morphine  (MSIR) 15 MG tablet Take by mouth.    [provider]  Multiple Vitamin (MULTIVITAMIN WITH MINERALS) TABS tablet Take 1 tablet by mouth daily. 12/26/22   Akula, Vijaya, MD  nicotine  (NICODERM CQ  - DOSED IN MG/24 HOURS) 14 mg/24hr patch One patch chest wall daily (okay to substitute generic) 07/15/23   Josette Ade, MD  NON FORMULARY Apply 1 Application topically 3 (three) times daily as needed. Rock Salt Cream (Similar to [employer])    [provider]  ondansetron   (ZOFRAN -ODT) 8 MG disintegrating tablet Take 1 tablet (8 mg total) by mouth every 8 (eight) hours as needed for nausea. 09/23/23   [name], Susan K, MD  pantoprazole  (PROTONIX ) 40 MG tablet Take 40 mg by mouth 2 (two) times daily before a meal.    [provider]  potassium chloride  (KLOR-CON  M) 10 MEQ tablet Take 1 tablet (10 mEq total) by mouth 2 (two) times daily for 7 days. 07/31/23 08/07/23  Laurita Pillion, MD  thiamine  (VITAMIN B1) 100 MG tablet Take 1 tablet (100 mg total) by mouth daily. 12/25/22   Akula, Vijaya, MD  vitamin B-12 (CYANOCOBALAMIN ) 500 MCG tablet Take 500 mcg by mouth daily.    [provider]    Physical Exam: Vitals:   09/24/23 1114 09/24/23 1500 09/24/23 1530 09/24/23  1559  BP: 100/61 (!) 100/44 106/63   Pulse: 91 90 93   Resp: 18  18   Temp: 98 F (36.7 C)   97.9 F (36.6 C)  TempSrc:    Oral  SpO2: 92% 97% 100%   Weight:      Height:        Constitutional: NAD, calm, comfortable Vitals:   09/24/23 1114 09/24/23 1500 09/24/23 1530 09/24/23 1559  BP: 100/61 (!) 100/44 106/63   Pulse: 91 90 93   Resp: 18  18   Temp: 98 F (36.7 C)   97.9 F (36.6 C)  TempSrc:    Oral  SpO2: 92% 97% 100%   Weight:      Height:       Eyes: PERRL, lids and conjunctivae normal ENMT: Mucous membranes are moist. Posterior pharynx clear of any exudate or lesions.Normal dentition.  Neck: normal, supple, no masses, no thyromegaly Respiratory: clear to auscultation bilaterally, no wheezing, no crackles. Normal respiratory effort. No accessory muscle use.  Cardiovascular: Regular rate and rhythm, no murmurs / rubs / gallops. No extremity edema. 2+ pedal pulses. No carotid bruits.  Abdomen: tenderness on epigastric area, no rebound no guarding, no masses palpated. No hepatosplenomegaly. Bowel sounds positive.  Musculoskeletal: no clubbing / cyanosis. No joint deformity upper and lower extremities. Good ROM, no contractures. Normal muscle tone.  Skin: no rashes,  lesions, ulcers. No induration Neurologic: CN 2-12 grossly intact. Sensation intact, DTR normal. Strength 5/5 in all 4.  Psychiatric: Normal judgment and insight. Alert and oriented x 3. Normal mood.     Labs on Admission: I have personally reviewed following labs and imaging studies  CBC: Recent Labs  Lab 09/24/23 1130  WBC 11.5*  HGB 11.7*  HCT 35.6*  MCV 92.7  PLT 254   Basic Metabolic Panel: Recent Labs  Lab 09/23/23 1448 09/23/23 1450 09/24/23 1130  NA 138  --  137  K 2.9*  --  2.7*  CL 95*  --  93*  CO2 27  --  33*  GLUCOSE 120*  --  112*  BUN 4*  --  6  CREATININE 0.51*  --  0.46  CALCIUM  7.9*  --  7.6*  MG  --  1.8  --    GFR: Estimated Creatinine Clearance: 74.5 mL/min (by C-G formula based on SCr of 0.46 mg/dL). Liver Function Tests: Recent Labs  Lab 09/24/23 1130  AST 18  ALT 8  ALKPHOS 104  BILITOT 0.6  PROT 5.9*  ALBUMIN  1.7*   Recent Labs  Lab 09/24/23 1130  LIPASE 80*   No results for input(s): AMMONIA in the last 168 hours. Coagulation Profile: No results for input(s): INR, PROTIME in the last 168 hours. Cardiac Enzymes: No results for input(s): CKTOTAL, CKMB, CKMBINDEX, TROPONINI in the last 168 hours. BNP (last 3 results) No results for input(s): PROBNP in the last 8760 hours. HbA1C: No results for input(s): HGBA1C in the last 72 hours. CBG: No results for input(s): GLUCAP in the last 168 hours. Lipid Profile: No results for input(s): CHOL, HDL, LDLCALC, TRIG, CHOLHDL, LDLDIRECT in the last 72 hours. Thyroid  Function Tests: Recent Labs    09/23/23 1449  TSH 2.430  FREET4 1.18   Anemia Panel: No results for input(s): VITAMINB12, FOLATE, FERRITIN, TIBC, IRON , RETICCTPCT in the last 72 hours. Urine analysis:    Component Value Date/Time   COLORURINE AMBER (A) 09/24/2023 1436   APPEARANCEUR CLOUDY (A) 09/24/2023 1436   APPEARANCEUR Clear 05/24/2014 1903  LABSPEC 1.019 09/24/2023  1436   LABSPEC 1.015 05/24/2014 1903   PHURINE 5.0 09/24/2023 1436   GLUCOSEU NEGATIVE 09/24/2023 1436   GLUCOSEU Negative 05/24/2014 1903   HGBUR NEGATIVE 09/24/2023 1436   BILIRUBINUR NEGATIVE 09/24/2023 1436   BILIRUBINUR Negative 05/24/2014 1903   KETONESUR NEGATIVE 09/24/2023 1436   PROTEINUR 30 (A) 09/24/2023 1436   NITRITE POSITIVE (A) 09/24/2023 1436   LEUKOCYTESUR MODERATE (A) 09/24/2023 1436   LEUKOCYTESUR Negative 05/24/2014 1903    Radiological Exams on Admission: No results found.  EKG: None  Assessment/Plan Principal Problem:   Pancreatitis Active Problems:   Pancreatic pseudocyst   Idiopathic acute pancreatitis without infection or necrosis  (please populate well all problems here in Problem List. (For example, if patient is on BP meds at home and you resume or decide to hold them, it is a problem that needs to be her. Same for CAD, COPD, HLD and so on)  Acute on chronic pancreatitis -With chronic pseudocyst formation.  According to [name] note, etiology appears to be idiopathic. -Patient was hospitalized two times previously since November 2024.  Appears to have been having increasing size of multiple pancreatic pseudocysts.  Unfortunately, patient has lost follow-up with [facility] for 1+ year.  As per advanced GI as recommendation today, goal is to get patient symptoms controlled during this hospitalization and the patient can be followed with [facility] as outpatient for further workup of close pseudocysts in pancreas. -Will obtain a CT abdomen pelvis with contrast to further characterize any progress of pancreatic pseudocysts. -IV morphine  alternate with p.o. morphine  for pain control -N.p.o., sips of fluid -Repeat lipase and CMP tomorrow  Hypokalemia -Secondary to decreased p.o. intake, IV and p.o. replacement, recheck level tomorrow and -Magnesium = 1.9.  Leukocytosis -Probably reactive, monitor off antibiotics -Patient denied any urinary symptoms and UA  contains large amount of squamous cells implying contamination.  Monitor off antibiotics  Seizure disorder -No acute issue, continue Keppra  Vimpat  and phenytoin   Severe hypoalbuminemia -Clinically suspect chronic pancreatic insufficiency given repeated flareups of chronic pancreatitis. -Start Creon   Cigarette smoking -Cessation education performed at bedside -Nicotine  patch  History of GCA -Followed by [facility] rheumatology, off steroid since May 2024  DVT prophylaxis: Lovenox  Code Status: Full code Family Communication: Mother at bedside Disposition Plan: Expect less than 2 midnight hospital stay Consults called: None Admission status: MedSurg observation   Cort ONEIDA Mana MD [facility] Pager [phone]  09/24/2023, 4:58 PM

## 2023-09-24 NOTE — Patient Instructions (Signed)
Patient is being transported to ED by EMS services.

## 2023-09-24 NOTE — ED Provider Notes (Signed)
 [facility] Provider Note    Event Date/Time   First MD Initiated Contact with Patient 09/24/23 1349     (approximate)   History   Abdominal Pain   HPI  Abigail [name] is a 58 y.o. female with a history of chronic pancreatitis who presents with epigastric abdominal pain.  Sent in for evaluation because of hypokalemia continued epigastric pain from gastroenterology.  Denies fevers or chills.  Review of records demonstrates the patient was admitted in December at our facility for similar presentation     Physical Exam   Triage Vital Signs: ED Triage Vitals  Encounter Vitals Group     BP 09/24/23 1114 100/61     Systolic BP Percentile --      Diastolic BP Percentile --      Pulse Rate 09/24/23 1114 91     Resp 09/24/23 1114 18     Temp 09/24/23 1114 98 F (36.7 C)     Temp src --      SpO2 09/24/23 1114 92 %     Weight 09/24/23 1111 66 kg (145 lb 9.6 oz)     Height 09/24/23 1111 1.702 m (5' 7)     Head Circumference --      Peak Flow --      Pain Score 09/24/23 1111 10     Pain Loc --      Pain Education --      Exclude from Growth Chart --     Most recent vital signs: Vitals:   09/24/23 1500 09/24/23 1530  BP: (!) 100/44 106/63  Pulse: 90 93  Resp:  18  Temp:    SpO2: 97% 100%     General: Awake, no distress.  CV:  Good peripheral perfusion.  Resp:  Normal effort.  Abd:  No distention.  Tenderness in the epigastrium Other:     ED Results / Procedures / Treatments   Labs (all labs ordered are listed, but only abnormal results are displayed) Labs Reviewed  LIPASE, BLOOD - Abnormal; Notable for the following components:      Result Value   Lipase 80 (*)    All other components within normal limits  COMPREHENSIVE METABOLIC PANEL - Abnormal; Notable for the following components:   Potassium 2.7 (*)    Chloride 93 (*)    CO2 33 (*)    Glucose, Bld 112 (*)    Calcium  7.6 (*)    Total Protein 5.9 (*)    Albumin   1.7 (*)    All other components within normal limits  CBC - Abnormal; Notable for the following components:   WBC 11.5 (*)    RBC 3.84 (*)    Hemoglobin 11.7 (*)    HCT 35.6 (*)    All other components within normal limits  URINALYSIS, ROUTINE W REFLEX MICROSCOPIC - Abnormal; Notable for the following components:   Color, Urine AMBER (*)    APPearance CLOUDY (*)    Protein, ur 30 (*)    Nitrite POSITIVE (*)    Leukocytes,Ua MODERATE (*)    Bacteria, UA FEW (*)    All other components within normal limits     EKG     RADIOLOGY     PROCEDURES:  Critical Care performed:   Procedures   MEDICATIONS ORDERED IN ED: Medications  potassium chloride  10 mEq in 100 mL IVPB (10 mEq Intravenous New Bag/Given 09/24/23 1450)  ciprofloxacin  (CIPRO ) IVPB 400 mg (has no administration in  time range)  0.9 %  sodium chloride  infusion ( Intravenous New Bag/Given 09/24/23 1448)  morphine  (PF) 4 MG/ML injection 4 mg (4 mg Intravenous Given 09/24/23 1435)  ondansetron  (ZOFRAN ) injection 4 mg (4 mg Intravenous Given 09/24/23 1435)  morphine  (PF) 4 MG/ML injection 4 mg (4 mg Intravenous Given 09/24/23 1535)     IMPRESSION / MDM / ASSESSMENT AND PLAN / ED COURSE  I reviewed the triage vital signs and the nursing notes. Patient's presentation is most consistent with severe exacerbation of chronic illness.  Patient with a history of chronic pancreatitis, chronic pain presents with epigastric discomfort consistent with acute upon chronic pancreatitis, her lipase is mildly elevated, review of imaging in the past demonstrates she does have a known history of pseudocyst and her potassium is chronically low  We will treat with IV morphine , IV Zofran , IV fluids, IV potassium and reevaluate, patient had CT scan 2 months ago which was unchanged from prior, doubt the utility of reimaging at this time  Patient continues to have pain, additional morphine  given  Discussed with hospitalist for admission       FINAL CLINICAL IMPRESSION(S) / ED DIAGNOSES   Final diagnoses:  Acute on chronic pancreatitis (HCC)     Rx / DC Orders   ED Discharge Orders     None        Note:  This document was prepared using Dragon voice recognition software and may include unintentional dictation errors.   Arlander Charleston, MD 09/24/23 [phone]

## 2023-09-24 NOTE — ED Triage Notes (Signed)
Pt comes via EMS from [place] center with c/o belly pain. Pt K+_ was low, low BP and pancreatitis. Pt was given 4 zofran, 75 mcg fent. Pt has IV in place. Pt given about 100cc LR.

## 2023-09-25 ENCOUNTER — Ambulatory Visit: Payer: Medicare HMO | Admitting: Dermatology

## 2023-09-25 DIAGNOSIS — G40909 Epilepsy, unspecified, not intractable, without status epilepticus: Secondary | ICD-10-CM | POA: Diagnosis present

## 2023-09-25 DIAGNOSIS — K863 Pseudocyst of pancreas: Secondary | ICD-10-CM | POA: Diagnosis present

## 2023-09-25 DIAGNOSIS — E8809 Other disorders of plasma-protein metabolism, not elsewhere classified: Secondary | ICD-10-CM | POA: Diagnosis present

## 2023-09-25 DIAGNOSIS — K8500 Idiopathic acute pancreatitis without necrosis or infection: Principal | ICD-10-CM

## 2023-09-25 DIAGNOSIS — Z881 Allergy status to other antibiotic agents status: Secondary | ICD-10-CM | POA: Diagnosis not present

## 2023-09-25 DIAGNOSIS — Z882 Allergy status to sulfonamides status: Secondary | ICD-10-CM | POA: Diagnosis not present

## 2023-09-25 DIAGNOSIS — Z79899 Other long term (current) drug therapy: Secondary | ICD-10-CM | POA: Diagnosis not present

## 2023-09-25 DIAGNOSIS — E871 Hypo-osmolality and hyponatremia: Secondary | ICD-10-CM | POA: Diagnosis present

## 2023-09-25 DIAGNOSIS — Z85828 Personal history of other malignant neoplasm of skin: Secondary | ICD-10-CM | POA: Diagnosis not present

## 2023-09-25 DIAGNOSIS — K861 Other chronic pancreatitis: Secondary | ICD-10-CM

## 2023-09-25 DIAGNOSIS — I959 Hypotension, unspecified: Secondary | ICD-10-CM | POA: Diagnosis present

## 2023-09-25 DIAGNOSIS — M797 Fibromyalgia: Secondary | ICD-10-CM | POA: Diagnosis present

## 2023-09-25 DIAGNOSIS — F17210 Nicotine dependence, cigarettes, uncomplicated: Secondary | ICD-10-CM | POA: Diagnosis present

## 2023-09-25 DIAGNOSIS — R1013 Epigastric pain: Secondary | ICD-10-CM | POA: Diagnosis present

## 2023-09-25 DIAGNOSIS — E785 Hyperlipidemia, unspecified: Secondary | ICD-10-CM | POA: Diagnosis present

## 2023-09-25 DIAGNOSIS — G9341 Metabolic encephalopathy: Secondary | ICD-10-CM | POA: Diagnosis present

## 2023-09-25 DIAGNOSIS — E876 Hypokalemia: Secondary | ICD-10-CM | POA: Diagnosis not present

## 2023-09-25 DIAGNOSIS — F1120 Opioid dependence, uncomplicated: Secondary | ICD-10-CM | POA: Diagnosis present

## 2023-09-25 DIAGNOSIS — Z96649 Presence of unspecified artificial hip joint: Secondary | ICD-10-CM | POA: Diagnosis present

## 2023-09-25 DIAGNOSIS — F4323 Adjustment disorder with mixed anxiety and depressed mood: Secondary | ICD-10-CM | POA: Diagnosis not present

## 2023-09-25 DIAGNOSIS — J4489 Other specified chronic obstructive pulmonary disease: Secondary | ICD-10-CM | POA: Diagnosis present

## 2023-09-25 DIAGNOSIS — Z90710 Acquired absence of both cervix and uterus: Secondary | ICD-10-CM | POA: Diagnosis not present

## 2023-09-25 DIAGNOSIS — K859 Acute pancreatitis without necrosis or infection, unspecified: Secondary | ICD-10-CM | POA: Diagnosis not present

## 2023-09-25 DIAGNOSIS — I2510 Atherosclerotic heart disease of native coronary artery without angina pectoris: Secondary | ICD-10-CM | POA: Diagnosis present

## 2023-09-25 DIAGNOSIS — E440 Moderate protein-calorie malnutrition: Secondary | ICD-10-CM | POA: Diagnosis present

## 2023-09-25 DIAGNOSIS — Z886 Allergy status to analgesic agent status: Secondary | ICD-10-CM | POA: Diagnosis not present

## 2023-09-25 DIAGNOSIS — R569 Unspecified convulsions: Secondary | ICD-10-CM | POA: Diagnosis not present

## 2023-09-25 DIAGNOSIS — J9611 Chronic respiratory failure with hypoxia: Secondary | ICD-10-CM | POA: Diagnosis present

## 2023-09-25 LAB — COMPREHENSIVE METABOLIC PANEL
ALT: 8 U/L (ref 0–44)
AST: 18 U/L (ref 15–41)
Albumin: 1.7 g/dL — ABNORMAL LOW (ref 3.5–5.0)
Alkaline Phosphatase: 96 U/L (ref 38–126)
Anion gap: 11 (ref 5–15)
BUN: 7 mg/dL (ref 6–20)
CO2: 31 mmol/L (ref 22–32)
Calcium: 7.7 mg/dL — ABNORMAL LOW (ref 8.9–10.3)
Chloride: 94 mmol/L — ABNORMAL LOW (ref 98–111)
Creatinine, Ser: 0.47 mg/dL (ref 0.44–1.00)
GFR, Estimated: >60 mL/min (ref >60)
Glucose, Bld: 85 mg/dL (ref 70–99)
Potassium: 3.5 mmol/L (ref 3.5–5.1)
Sodium: 136 mmol/L (ref 135–145)
Total Bilirubin: 0.8 mg/dL (ref 0.0–1.2)
Total Protein: 5.7 g/dL — ABNORMAL LOW (ref 6.5–8.1)

## 2023-09-25 LAB — LIPASE, BLOOD: Lipase: 81 U/L — ABNORMAL HIGH (ref 11–51)

## 2023-09-25 MED ORDER — MODAFINIL 100 MG PO TABS
200.0000 mg | ORAL_TABLET | Freq: Every day | ORAL | Status: DC
  Administered 2023-09-26 – 2023-10-04 (×9): 200 mg via ORAL
  Filled 2023-09-25 (×10): qty 2

## 2023-09-25 MED ORDER — SIMETHICONE 80 MG PO CHEW
80.0000 mg | CHEWABLE_TABLET | Freq: Four times a day (QID) | ORAL | Status: DC | PRN
  Administered 2023-09-25 – 2023-09-28 (×6): 80 mg via ORAL
  Filled 2023-09-25 (×6): qty 1

## 2023-09-25 MED ORDER — CAPSAICIN 0.025 % EX CREA
1.0000 | TOPICAL_CREAM | Freq: Once | CUTANEOUS | Status: AC
Start: 2023-09-26 — End: 2023-09-26
  Administered 2023-09-26: 1 via TOPICAL
  Filled 2023-09-25: qty 1

## 2023-09-25 NOTE — ED Notes (Signed)
 Report given.

## 2023-09-25 NOTE — ED Notes (Signed)
 Hospitalist informed about pt medications being incorrect, pharmacy tech asked to verify with pt to update correct list

## 2023-09-25 NOTE — Progress Notes (Signed)
 Progress Note   Patient: Abigail [name] [facility] FMW:[id number] DOB: 12/30/1965 DOA: 09/24/2023     0 DOS: the patient was seen and examined on 09/25/2023   Brief hospital course:  Abigail [name] is a 58 y.o. female with medical history significant of recurrent pancreatitis with pseudocyst formation, seizure disorder, chronic pain and narcotic dependence, GCA, asthma/COPD, chronic hypoxic respiratory failure on 2 L continuously, GCA off steroid since May 2024, sent from [facility] office for evaluation of recurrent pancreatitis.   Pt was admitted for further evaluation and management.  Being treated with supportive care including pain control, antiemetics, and initially kept NPO.  2/5 - tolerating some clear liquids. Ongoing nausea and epigastric pain. No dry heaves this AM per pt.   Assessment and Plan:  Acute on chronic pancreatitis With chronic pseudocyst formation.  According to [facility] note, etiology appears to be idiopathic. Patient was hospitalized two times previously since November 2024.  Appears to have been having increasing size of multiple pancreatic pseudocysts.  Unfortunately, patient has lost follow-up with [name] for 1+ year.  Pt sent to ER from local GI clinic due to severity of symptoms, hypokalemia and hypotension.   2/4 CT abdomen pelvis with contrast showed changes of acute on chronic pancreatitis with multiple peripancreatic collections increased in size and number from prior study, peripancreatic edema, reactive mesenteric/omental edema. --Clear liquid diet, advance as tolerated --Pain control and antiemetics PRN --Creon  --IV fluids --Monitor CMP, lipase levels --Consider GI consultation, but no current indication for their services   Hypokalemia - K 2.7 on admission was replaced >> K 3.5 today --Due to poor PO intake, vomiting --Monitor BMP, Mg & replace as needed  Leukocytosis - suspect reactive, monitor off antibiotics Abnormal UA - contaminated sample  with large # squamous epi cells Pt denies any UTI symptoms.   Monitor off antibiotics   Seizure disorder - stable --continue Keppra  Vimpat  and phenytoin  --Pharmacy to verify current meds as pt reported recent changes   Severe hypoalbuminemia Clinically suspect chronic pancreatic insufficiency given repeated flareups of chronic pancreatitis. --Started on Creon    Cigarette smoking -Cessation education performed at bedside -Nicotine  patch   History of GCA -Followed by [name] rheumatology, off steroid since May 2024      Subjective: Pt seen in the ED holding for a bed this AM.  She reports uncontrolled pain and next dose of medication still an hour away, asking for more pain medication but note that pt has difficulty keeping her eyes open.   Physical Exam: Vitals:   09/25/23 0730 09/25/23 0930 09/25/23 1045 09/25/23 1141  BP: (!) 96/47 (!) 122/94 120/84 102/71  Pulse: 91 94 98   Resp:    18  Temp:    97.9 F (36.6 C)  TempSrc:    Oral  SpO2: 91% 93% 98% 96%  Weight:      Height:       General exam: awake, appears very drowsy, no acute distress HEENT: difficulty keeping eyes open, moist mucus membranes, hearing grossly normal  Respiratory system: CTAB, no wheezes, rales or rhonchi, normal respiratory effort. Cardiovascular system: normal S1/S2, RRR, no JVD, murmurs, rubs, gallops, no pedal edema.   Gastrointestinal system: soft, mild epigastric tenderness without rebound or guarding. Central nervous system: A&O x 2+. no gross focal neurologic deficits, normal speech Extremities: moves all, no edema, normal tone Skin: dry, intact, normal temperature Psychiatry: normal mood, flat affect   Data Reviewed:  Notable labs --   Cl 94 Ca  7.8 Albumin  1.7 Lipase 80 >> 81  Family Communication: None  Disposition: Status is: Inpatient Remains inpatient appropriate because: ongoing pancreatitis, not tolerating PO intake, still requiring IV medications as above   Planned  Discharge Destination: Home    Time spent: 42 minutes  Author: Burnard DELENA Cunning, DO 09/25/2023 2:47 PM  For on call review [email or web address].

## 2023-09-26 DIAGNOSIS — K861 Other chronic pancreatitis: Secondary | ICD-10-CM | POA: Diagnosis not present

## 2023-09-26 LAB — COMPREHENSIVE METABOLIC PANEL
ALT: 10 U/L (ref 0–44)
AST: 16 U/L (ref 15–41)
Albumin: 2 g/dL — ABNORMAL LOW (ref 3.5–5.0)
Alkaline Phosphatase: 101 U/L (ref 38–126)
Anion gap: 13 (ref 5–15)
BUN: 9 mg/dL (ref 6–20)
CO2: 23 mmol/L (ref 22–32)
Calcium: 7.8 mg/dL — ABNORMAL LOW (ref 8.9–10.3)
Chloride: 91 mmol/L — ABNORMAL LOW (ref 98–111)
Creatinine, Ser: 0.55 mg/dL (ref 0.44–1.00)
GFR, Estimated: >60 mL/min (ref >60)
Glucose, Bld: 80 mg/dL (ref 70–99)
Potassium: 3.5 mmol/L (ref 3.5–5.1)
Sodium: 130 mmol/L — ABNORMAL LOW (ref 135–145)
Total Bilirubin: 0.8 mg/dL (ref 0.0–1.2)
Total Protein: 6.6 g/dL (ref 6.5–8.1)

## 2023-09-26 LAB — CBC
HCT: 39.9 % (ref 36.0–46.0)
Hemoglobin: 13.1 g/dL (ref 12.0–15.0)
MCH: 30.3 pg (ref 26.0–34.0)
MCHC: 32.8 g/dL (ref 30.0–36.0)
MCV: 92.4 fL (ref 80.0–100.0)
Platelets: 305 10*3/uL (ref 150–400)
RBC: 4.32 MIL/uL (ref 3.87–5.11)
RDW: 14.7 % (ref 11.5–15.5)
WBC: 12.2 10*3/uL — ABNORMAL HIGH (ref 4.0–10.5)
nRBC: 0 % (ref 0.0–0.2)

## 2023-09-26 LAB — MAGNESIUM: Magnesium: 2 mg/dL (ref 1.7–2.4)

## 2023-09-26 LAB — LIPASE, BLOOD: Lipase: 74 U/L — ABNORMAL HIGH (ref 11–51)

## 2023-09-26 MED ORDER — PHENYTOIN 50 MG PO CHEW
100.0000 mg | CHEWABLE_TABLET | Freq: Every day | ORAL | Status: AC
  Filled 2023-09-26 (×5): qty 2

## 2023-09-26 MED ORDER — HYDROMORPHONE HCL 1 MG/ML IJ SOLN: 1.0000 mg | INTRAMUSCULAR | Status: DC | PRN

## 2023-09-26 MED ORDER — HYDROMORPHONE HCL 2 MG PO TABS
4.0000 mg | ORAL_TABLET | ORAL | Status: DC | PRN
  Administered 2023-09-26 – 2023-09-28 (×10): 4 mg via ORAL
  Administered 2023-09-28 (×2): 2 mg via ORAL
  Administered 2023-09-30 – 2023-10-02 (×8): 4 mg via ORAL
  Filled 2023-09-26 (×20): qty 2

## 2023-09-26 MED ORDER — CAPSAICIN 0.025 % EX CREA
1.0000 | TOPICAL_CREAM | Freq: Two times a day (BID) | CUTANEOUS | Status: DC
  Administered 2023-09-26 – 2023-10-04 (×16): 1 via TOPICAL
  Filled 2023-09-26: qty 1
  Filled 2023-09-26: qty 60

## 2023-09-26 MED ORDER — OXYCODONE HCL 5 MG PO TABS
5.0000 mg | ORAL_TABLET | ORAL | Status: DC | PRN
  Administered 2023-09-26: 5 mg via ORAL
  Filled 2023-09-26: qty 1

## 2023-09-26 MED ORDER — LEVETIRACETAM 750 MG PO TABS: 1500.0000 mg | ORAL_TABLET | Freq: Two times a day (BID) | ORAL | Status: DC

## 2023-09-26 MED ORDER — SODIUM CHLORIDE 0.9 % IV SOLN: INTRAVENOUS | Status: DC

## 2023-09-26 MED ORDER — CARBAMAZEPINE ER 100 MG PO TB12
100.0000 mg | ORAL_TABLET | Freq: Every day | ORAL | Status: DC
  Administered 2023-09-26 – 2023-10-02 (×7): 100 mg via ORAL
  Filled 2023-09-26 (×7): qty 1

## 2023-09-26 MED ORDER — KETOROLAC TROMETHAMINE 30 MG/ML IJ SOLN: 30.0000 mg | Freq: Four times a day (QID) | INTRAMUSCULAR | Status: DC

## 2023-09-26 NOTE — TOC Initial Note (Signed)
 Transition of Care [facility]) - Initial/Assessment Note    Patient Details  Name: Abigail [name] [facility] MRN: [id number] Date of Birth: Jul 19, 1966  Transition of Care [name]) CM/SW Contact:    Abigail ONEIDA Haddock, RN Phone Number: 09/26/2023, 4:09 PM  Clinical Narrative:                  Admitted for: pancreatitis  Admitted from: Home with son PCP: Abigail [name]  Current home health/prior home health/DME: cane, standard walker, rollator, shower chair, home O2 at 2L, and WC  Patient states that she is not currently active with home health services.  She request for home health services to be arranged through Abigail [name].   Patient states that family or friend will transport at [employer] and bring portable O2 Message sent to Abigail [name] with [facility] to see if they can accept referral    Expected Discharge Plan: Home w Home Health Services     Patient Goals and CMS Choice            Expected Discharge Plan and Services                                              Prior Living Arrangements/Services                       Activities of Daily Living      Permission Sought/Granted                  Emotional Assessment              Admission diagnosis:  Pancreatitis [K85.90] Acute on chronic pancreatitis (HCC) [K85.90, K86.1] Chronic recurrent pancreatitis (HCC) [K86.1] Patient Active Problem List   Diagnosis Date Noted   Chronic recurrent pancreatitis (HCC) 09/25/2023   Pancreatitis 07/30/2023   Chronic respiratory failure with hypoxia (HCC) 07/29/2023   SIRS (systemic inflammatory response syndrome) (HCC) 07/29/2023   Pancreatic pseudocyst 07/13/2023   History of seizure 06/28/2023   Seizure (HCC) 06/28/2023   Epilepsy (HCC) 06/28/2023   Osteopenia 05/29/2023   Protein-calorie malnutrition, severe 12/21/2022   Giant cell arteritis (HCC) 12/19/2022   DNR (do not resuscitate) 12/19/2022   Status epilepticus (HCC) 12/18/2022   Hypokalemia 12/08/2022    Acute metabolic encephalopathy 12/07/2022   Idiopathic acute pancreatitis without infection or necrosis 08/08/2022   Tobacco use disorder 08/08/2022   Asthma 04/26/2020   Vertigo 04/26/2020   Drug-induced constipation 04/26/2020   Screening for colon cancer 04/26/2020   GERD (gastroesophageal reflux disease) 11/12/2016   Chronic prescription opiate use - thru [facility] in [place], [place] 04/29/2016   Long term current use of immunosuppressive drug 04/29/2016   Neuroma digital nerve 06/04/2013   Depression 02/16/2013   Anxiety 02/13/2013   Chronic pain 02/13/2013   DDD (degenerative disc disease), cervical 02/13/2013   Rheumatoid arthritis (HCC) 02/13/2013   PCP:  Abigail [name], Abigail K, MD Pharmacy:   [facility] [phone] - [place], [place] - [address] [place] [address] Phone: [phone] Fax: [phone]  [name] [address] [place] [address] Phone: [phone] Fax: [phone]     Social Drivers of Health (SDOH) Social History: SDOH Screenings   Food Insecurity: Patient Unable To Answer (09/25/2023)  Recent Concern: Food Insecurity - Food Insecurity Present (07/02/2023)  Housing: Patient Unable To Answer (09/25/2023)  Transportation Needs: Patient Unable To Answer (09/25/2023)  Utilities: Patient Unable To Answer (09/25/2023)  Depression (PHQ2-9): High Risk (09/18/2023)  Tobacco Use: High Risk (09/24/2023)   SDOH Interventions:     Readmission Risk Interventions    09/26/2023    4:06 PM 07/31/2023    1:07 PM  Readmission Risk Prevention Plan  Transportation Screening Complete Complete  Medication Review Oceanographer) Complete Complete  PCP or Specialist appointment within 3-5 days of discharge  Complete  HRI or Home Care Consult  Complete  SW Recovery Care/Counseling Consult Complete Complete  Palliative Care Screening Not Applicable Not Applicable  Skilled Nursing Facility Not Applicable Not Applicable

## 2023-09-26 NOTE — Progress Notes (Signed)
 Patient refusing anti-seizure medications during last three shifts. RN notified Darryl Endow. MD in route to speak with patient. Seizure precautions initiated at 1300. Side rails padded.

## 2023-09-26 NOTE — Progress Notes (Signed)
 Progress Note   Patient: Abigail [name] [facility] FMW:[id number] DOB: [birth year] DOA: 09/24/2023     1 DOS: the patient was seen and examined on 09/26/2023   Brief hospital course:  Abigail [name] is a 58 y.o. female with medical history significant of recurrent pancreatitis with pseudocyst formation, seizure disorder, chronic pain and narcotic dependence, GCA, asthma/COPD, chronic hypoxic respiratory failure on 2 L continuously, GCA off steroid since May 2024, sent from [facility] office for evaluation of recurrent pancreatitis.   Abigail [name] was admitted for further evaluation and management.  Being treated with supportive care including pain control, antiemetics, and initially kept NPO.  2/5 - tolerating some clear liquids. Ongoing nausea and epigastric pain. No dry heaves this AM per Abigail [name].   Assessment and Plan:  Acute on chronic pancreatitis With chronic pseudocyst formation.  According to [facility] GI note, etiology appears to be idiopathic. Patient was hospitalized two times previously since November 2024.  Appears to have been having increasing size of multiple pancreatic pseudocysts.  Unfortunately, patient has lost follow-up with [facility] GI for 1+ year.  Abigail [name] sent to ER from local GI clinic due to severity of symptoms, hypokalemia and hypotension.   [facility] GI records reviewed in Care Everywhere 2/4 CT abdomen pelvis with contrast showed changes of acute on chronic pancreatitis with multiple peripancreatic collections increased in size and number from prior study, peripancreatic edema, reactive mesenteric/omental edema. --Clear >> full liquid diet, advance as tolerated --Pain control and antiemetics PRN --Creon  --IV fluids --Monitor CMP, lipase levels --Consider GI consultation, but no current indication for their services --Follow up with GI at [facility] as outpatient   Hypokalemia - K 2.7 on admission was replaced >> K 3.5 >> 3.5 today --Due to poor PO intake, vomiting --Monitor BMP, Mg & replace as  needed  Hyponatremia - Na 130 today --Start NS @ 75 cc/hr --Monitor BMP --Encourage PO intake  Leukocytosis - suspect reactive, monitor off antibiotics Abnormal UA - contaminated sample with large # squamous epi cells Abigail [name] denies any UTI symptoms.   Monitor off antibiotics   Seizure disorder - stable --continue Keppra  Vimpat  and phenytoin  --Pharmacy to verify current meds as Abigail [name] reported recent changes   Severe hypoalbuminemia Clinically suspect chronic pancreatic insufficiency given repeated flareups of chronic pancreatitis. --Started on Creon    Cigarette smoking -Cessation education performed at bedside -Nicotine  patch   History of GCA -Followed by [facility] rheumatology, off steroid since May 2024      Subjective: Abigail [name] seen at bedside today.  Reports ongoing epigastric pain and didn't try anything for breakfast.  RN reports having to go in every hour or two with medications.  Abigail [name] refusing her seizure medications.  I advised Abigail [name] of her risk of having a seizure and complications from that.  Abigail [name] continues to repeatedly ask for more pain medicine.   Physical Exam: Vitals:   09/26/23 0600 09/26/23 0635 09/26/23 0646 09/26/23 0704  BP:    100/64  Pulse: 94 (!) 101 94 96  Resp:      Temp:    98 F (36.7 C)  TempSrc:      SpO2: 97% 95% 95% 96%  Weight:      Height:       General exam: awake, appears very drowsy, no acute distress HEENT: difficulty keeping eyes open, moist mucus membranes, hearing grossly normal, anicteric sclera  Respiratory system: on room air, normal respiratory effort. Cardiovascular system: RRR, no pedal edema.   Gastrointestinal system: soft, mild epigastric with voluntary  guarding, no rebound tenderness or peritoneal signs. Central nervous system: A&O x 2+. no gross focal neurologic deficits, normal speech Extremities: moves all, no edema, normal tone Skin: dry, intact, normal temperature, no jaundice  Psychiatry: normal mood, flat affect   Data  Reviewed:  Notable labs --   Na 130 Cl 91 Ca 7.8 Albumin  1.7 >> 2.0  Lipase 80 >> 81 >> 74 WBC 11.5 >> 12.2   Family Communication: None. Abigail [name] is capable to update if she wishes.  Disposition: Status is: Inpatient Remains inpatient appropriate because: ongoing pancreatitis, not tolerating PO intake, still requiring IV medications as above   Planned Discharge Destination: Home    Time spent: 52 minutes including time at bedside and in coordination of care with staff  Author: Burnard DELENA Cunning, DO 09/26/2023 3:00 PM  For on call review [email or web address].

## 2023-09-27 DIAGNOSIS — G9341 Metabolic encephalopathy: Secondary | ICD-10-CM | POA: Diagnosis not present

## 2023-09-27 DIAGNOSIS — K861 Other chronic pancreatitis: Secondary | ICD-10-CM | POA: Diagnosis not present

## 2023-09-27 DIAGNOSIS — E871 Hypo-osmolality and hyponatremia: Secondary | ICD-10-CM

## 2023-09-27 DIAGNOSIS — K863 Pseudocyst of pancreas: Secondary | ICD-10-CM

## 2023-09-27 DIAGNOSIS — E876 Hypokalemia: Secondary | ICD-10-CM

## 2023-09-27 DIAGNOSIS — K8500 Idiopathic acute pancreatitis without necrosis or infection: Secondary | ICD-10-CM | POA: Diagnosis not present

## 2023-09-27 LAB — BASIC METABOLIC PANEL
Anion gap: 11 (ref 5–15)
BUN: 6 mg/dL (ref 6–20)
CO2: 28 mmol/L (ref 22–32)
Calcium: 7.8 mg/dL — ABNORMAL LOW (ref 8.9–10.3)
Chloride: 92 mmol/L — ABNORMAL LOW (ref 98–111)
Creatinine, Ser: 0.49 mg/dL (ref 0.44–1.00)
GFR, Estimated: >60 mL/min (ref >60)
Glucose, Bld: 86 mg/dL (ref 70–99)
Potassium: 3.1 mmol/L — ABNORMAL LOW (ref 3.5–5.1)
Sodium: 131 mmol/L — ABNORMAL LOW (ref 135–145)

## 2023-09-27 MED ORDER — POTASSIUM CHLORIDE CRYS ER 20 MEQ PO TBCR
40.0000 meq | EXTENDED_RELEASE_TABLET | ORAL | Status: AC
  Administered 2023-09-27 (×2): 40 meq via ORAL
  Filled 2023-09-27 (×2): qty 2

## 2023-09-27 MED ORDER — KETOROLAC TROMETHAMINE 30 MG/ML IJ SOLN
30.0000 mg | Freq: Four times a day (QID) | INTRAMUSCULAR | Status: DC | PRN
  Administered 2023-09-27 – 2023-10-02 (×12): 30 mg via INTRAVENOUS
  Filled 2023-09-27 (×12): qty 1

## 2023-09-27 NOTE — Progress Notes (Addendum)
 RN Assessed patient at 47 and gave patient 4mg  of dilaudid  per patient request. Patient stated her pain was a 7 out of 10. Patient was alert and laughing when RN walked in for assessment. RN rechecked pain a hour later and patient stated it was a 5 out of 10 and that she felt like she was doing better. Patient asked for a breakthrough dilaudid  at [phone]. RN stated that the order for 1mg  IV dilaudid  was discontinued due to her potentially going home today due to her doing so well. Patient then stated I refuse to go home my pain is not under control yet and you can let the provider know I am refusing to go home. RN stated she would notify the provider about patients concerns and RN notified Dr. Fausto of changes at 10:33 . Provider stated she would assist in patients concerns during rounding which she was activity doing. When RN assesses patient for pain at 1010 she stated it was a 6/10 and when RN mentioned potential discharge patient became emotional and stated her pain was a 10/10 at 1013. RN notified DR. Griffith of changes at 10:37 per secure chat. Patient is up moving independently other than she needs assistance unhooking from O2 monitoring and oxygen  when going to ambulate. Patient is eating a full liquid diet and orders three trays a day and drinks 75-100% of eat meal tray offered.

## 2023-09-27 NOTE — Progress Notes (Signed)
 Progress Note   Patient: Abigail [name] [facility] FMW:[id number] DOB: 09-04-1965 DOA: 09/24/2023     2 DOS: the patient was seen and examined on 09/27/2023   Brief hospital course:  [name] is a 58 y.o. female with medical history significant of recurrent pancreatitis with pseudocyst formation, seizure disorder, chronic pain and narcotic dependence, GCA, asthma/COPD, chronic hypoxic respiratory failure on 2 L continuously, GCA off steroid since May 2024, sent from [facility] office for evaluation of recurrent pancreatitis.   Pt was admitted for further evaluation and management.  Being treated with supportive care including pain control, antiemetics, and initially kept NPO.  2/5 - tolerating some clear liquids. Ongoing nausea and epigastric pain. No dry heaves this AM per pt.   Assessment and Plan:  Acute on chronic pancreatitis With chronic pseudocyst formation.  According to [facility] GI note, etiology appears to be idiopathic. Patient was hospitalized two times previously since November 2024.  Appears to have been having increasing size of multiple pancreatic pseudocysts.  Unfortunately, patient has lost follow-up with [facility] GI for 1+ year.  Pt sent to ER from local GI clinic due to severity of symptoms, hypokalemia and hypotension.   [facility] GI records reviewed in Care Everywhere 2/4 CT abdomen pelvis with contrast showed changes of acute on chronic pancreatitis with multiple peripancreatic collections increased in size and number from prior study, peripancreatic edema, reactive mesenteric/omental edema. 2/6 -- changed PO morphine  to PO dilaudid , pt did not require any IV dilaudid  2/7 -- lower pain scores, IV dilaudid  dc'd not used in over 24 hours, diet advanced --Advance to LOW FAT DIET --Pain control and antiemetics PRN --NO IV NARCOTICS --IV Toradol  for breakthrough pain (pt reports not allergic, has received Toradol  multiple times in past without issue) --Creon  --IV fluids -  continue given hyponatremia --Monitor CMP, lipase levels --No signs of infection of necrosis of known fluid collections, no surgical or procedure intervention is indicated --Follow up with GI at [facility] as outpatient   Hypokalemia - K 2.7 on admission was replaced >>  K 3.5 >> 3.5 >> 3.1 today Due to poor PO intake. --PO K 40 x 2 doses --Monitor BMP, Mg & replace as needed  Hyponatremia - Na 130 >> 131 today --Continue NS @ 75 cc/hr --Monitor BMP --Encourage PO intake  Leukocytosis - suspect reactive, monitor off antibiotics Abnormal UA - contaminated sample with large # squamous epi cells Pt denies any UTI symptoms.   Monitor off antibiotics   Seizure disorder - stable --continue Keppra  Vimpat  and phenytoin  --Pharmacy to verify current meds as pt reported recent changes   Severe hypoalbuminemia Clinically suspect chronic pancreatic insufficiency given repeated flareups of chronic pancreatitis. --Started on Creon    Cigarette smoking -Cessation education performed at bedside -Nicotine  patch   History of GCA -Followed by [facility] rheumatology, off steroid since May 2024      Subjective: Pt seen with mother at bedside today.  Pt reports ongoing pain and poor PO intake tolerance.  No nausea or vomiting.  Pt consuming everything on meal tray when on full liquids yesterday and this AM.  Diet advanced for lunch, mother in room, full meal eaten but reportedly pt only ate 1/4 of quesadilla and a little mac and cheese.  Now reports increased pain.    Discussed at great length chronic pancreatitis, expectations for pain control, risks of overmedicating with narcotics and her oxygen  already dropping when sleeping.     Physical Exam: Vitals:   09/27/23 0808 09/27/23 0900  09/27/23 1100 09/27/23 1515  BP: 116/78  113/75 107/66  Pulse:   87   Resp:   18 18  Temp:   97.8 F (36.6 C) 97.7 F (36.5 C)  TempSrc:   Oral Oral  SpO2: 97% 99% 98%   Weight:      Height:       General exam:  awake, appears very drowsy, no acute distress HEENT: heavy eyelids, eyes periodically roll back, moist mucus membranes, hearing grossly normal Respiratory system: on room air, normal respiratory effort. Cardiovascular system: RRR, no pedal edema.   Gastrointestinal system: soft, non-tender, non-distended Central nervous system: A&O x 2+. no gross focal neurologic deficits, normal speech Extremities: moves all, no edema, normal tone Skin: normal color, no rashes Psychiatry: normal mood, flat affect   Data Reviewed:  Notable labs --   Na 130 >> 131 Cl 91 >> 92 Ca 7.8  From 2/6 --  Albumin  1.7 >> 2.0  Lipase 80 >> 81 >> 74 WBC 11.5 >> 12.2   Family Communication: Pt's mother at bedside today  Disposition: Status is: Inpatient Remains inpatient appropriate because: ongoing pancreatitis, not tolerating PO intake, still requiring IV medications as above   Planned Discharge Destination: Home    Time spent: 50 minutes including time at bedside and in coordination of care with staff  Author: Burnard DELENA Cunning, DO 09/27/2023 5:21 PM  For on call review [email or web address].

## 2023-09-27 NOTE — TOC Progression Note (Signed)
 Transition of Care [facility]) - Progression Note    Patient Details  Name: Abigail [name] [facility] MRN: [id number] Date of Birth: [birth year]  Transition [facility] Care [facility]) CM/SW Contact  Lauraine JAYSON Carpen, LCSW Phone Number: 09/27/2023, 12:09 PM  Clinical Narrative:  [facility] confirmed they can accept referral for PT and OT.   Expected Discharge Plan: Home w Home Health Services    Expected Discharge Plan and Services                                               Social Determinants of Health (SDOH) Interventions SDOH Screenings   Food Insecurity: Patient Unable To Answer (09/25/2023)  Recent Concern: Food Insecurity - Food Insecurity Present (07/02/2023)  Housing: Patient Unable To Answer (09/25/2023)  Transportation Needs: Patient Unable To Answer (09/25/2023)  Utilities: Patient Unable To Answer (09/25/2023)  Depression (PHQ2-9): High Risk (09/18/2023)  Tobacco Use: High Risk (09/24/2023)    Readmission Risk Interventions    09/26/2023    4:06 PM 07/31/2023    1:07 PM  Readmission Risk Prevention Plan  Transportation Screening Complete Complete  Medication Review Oceanographer) Complete Complete  PCP or Specialist appointment within 3-5 days of discharge  Complete  HRI or Home Care Consult  Complete  SW Recovery Care/Counseling Consult Complete Complete  Palliative Care Screening Not Applicable Not Applicable  Skilled Nursing Facility Not Applicable Not Applicable

## 2023-09-28 DIAGNOSIS — E871 Hypo-osmolality and hyponatremia: Secondary | ICD-10-CM | POA: Diagnosis not present

## 2023-09-28 DIAGNOSIS — K861 Other chronic pancreatitis: Secondary | ICD-10-CM | POA: Diagnosis not present

## 2023-09-28 DIAGNOSIS — E876 Hypokalemia: Secondary | ICD-10-CM | POA: Diagnosis not present

## 2023-09-28 LAB — COMPREHENSIVE METABOLIC PANEL
ALT: 10 U/L (ref 0–44)
AST: 18 U/L (ref 15–41)
Albumin: 2.1 g/dL — ABNORMAL LOW (ref 3.5–5.0)
Alkaline Phosphatase: 90 U/L (ref 38–126)
Anion gap: 9 (ref 5–15)
BUN: <5 mg/dL — ABNORMAL LOW (ref 6–20)
CO2: 27 mmol/L (ref 22–32)
Calcium: 8 mg/dL — ABNORMAL LOW (ref 8.9–10.3)
Chloride: 97 mmol/L — ABNORMAL LOW (ref 98–111)
Creatinine, Ser: 0.43 mg/dL — ABNORMAL LOW (ref 0.44–1.00)
GFR, Estimated: >60 mL/min (ref >60)
Glucose, Bld: 80 mg/dL (ref 70–99)
Potassium: 3.9 mmol/L (ref 3.5–5.1)
Sodium: 133 mmol/L — ABNORMAL LOW (ref 135–145)
Total Bilirubin: 0.4 mg/dL (ref 0.0–1.2)
Total Protein: 6.8 g/dL (ref 6.5–8.1)

## 2023-09-28 LAB — CBC
HCT: 36.2 % (ref 36.0–46.0)
Hemoglobin: 11.8 g/dL — ABNORMAL LOW (ref 12.0–15.0)
MCH: 30.6 pg (ref 26.0–34.0)
MCHC: 32.6 g/dL (ref 30.0–36.0)
MCV: 93.8 fL (ref 80.0–100.0)
Platelets: 306 10*3/uL (ref 150–400)
RBC: 3.86 MIL/uL — ABNORMAL LOW (ref 3.87–5.11)
RDW: 14.8 % (ref 11.5–15.5)
WBC: 8.3 10*3/uL (ref 4.0–10.5)
nRBC: 0 % (ref 0.0–0.2)

## 2023-09-28 LAB — LIPASE, BLOOD: Lipase: 190 U/L — ABNORMAL HIGH (ref 11–51)

## 2023-09-28 MED ORDER — BISACODYL 5 MG PO TBEC: 5.0000 mg | DELAYED_RELEASE_TABLET | Freq: Every day | ORAL | Status: DC | PRN

## 2023-09-28 MED ORDER — POLYETHYLENE GLYCOL 3350 17 G PO PACK
17.0000 g | PACK | Freq: Every day | ORAL | Status: DC
  Administered 2023-09-28 – 2023-10-04 (×3): 17 g via ORAL
  Filled 2023-09-28 (×6): qty 1

## 2023-09-28 MED ORDER — SENNOSIDES-DOCUSATE SODIUM 8.6-50 MG PO TABS
1.0000 | ORAL_TABLET | Freq: Two times a day (BID) | ORAL | Status: DC
Start: 2023-09-28 — End: 2023-10-05
  Administered 2023-09-28 – 2023-10-04 (×11): 1 via ORAL
  Filled 2023-09-28 (×11): qty 1

## 2023-09-28 NOTE — Progress Notes (Signed)
 Progress Note   Patient: Abigail [name] [facility] FMW:[id number] DOB: 06/05/1966 DOA: 09/24/2023     3 DOS: the patient was seen and examined on 09/28/2023   Brief hospital course:  Rukiya Antietam [facility] is a 58 y.o. female with medical history significant of recurrent pancreatitis with pseudocyst formation, seizure disorder, chronic pain and narcotic dependence, GCA, asthma/COPD, chronic hypoxic respiratory failure on 2 L continuously, GCA off steroid since May 2024, sent from [facility] office for evaluation of recurrent pancreatitis.   Pt was admitted for further evaluation and management.  Being treated with supportive care including pain control, antiemetics, and initially kept NPO.  2/5 - tolerating some clear liquids. Ongoing nausea and epigastric pain. No dry heaves this AM per pt.   Assessment and Plan:  Acute on chronic pancreatitis With chronic pseudocyst formation.  According to [facility] GI note, etiology appears to be idiopathic. Patient was hospitalized two times previously since November 2024.  Appears to have been having increasing size of multiple pancreatic pseudocysts.  Unfortunately, patient has lost follow-up with [facility] GI for 1+ year.  Pt sent to ER from local GI clinic due to severity of symptoms, hypokalemia and hypotension.   [facility] GI records reviewed in Care Everywhere 2/4 CT abdomen pelvis with contrast showed changes of acute on chronic pancreatitis with multiple peripancreatic collections increased in size and number from prior study, peripancreatic edema, reactive mesenteric/omental edema. 2/6 -- changed PO morphine  to PO dilaudid , pt did not require any IV dilaudid  2/7 -- lower pain scores, IV dilaudid  dc'd not used in over 24 hours, diet advanced --Advance to LOW FAT DIET --Pain control and antiemetics PRN --NO IV NARCOTICS --IV Toradol  for breakthrough pain (pt reports not allergic, has received Toradol  multiple times in past without issue) --Creon  --IV fluids -  continue given hyponatremia --Monitor CMP, lipase levels --No signs of infection of necrosis of known fluid collections, no surgical or procedure intervention is indicated --Follow up with GI at [facility] as outpatient   Hypokalemia - Replaced.  Due to poor PO intake in setting of pancreatitis. --Monitor BMP, Mg & replace as needed  Hyponatremia - Na 130 >> 131 >> 133 today --Continue NS @ 75 cc/hr for today --Monitor BMP --Encourage PO intake  Leukocytosis - suspect reactive, monitor off antibiotics Abnormal UA - contaminated sample with large # squamous epi cells Pt denies any UTI symptoms.   Monitor off antibiotics   Seizure disorder - stable --continue Keppra  Vimpat  and phenytoin  --Pharmacy to verify current meds as pt reported recent changes   Severe hypoalbuminemia Clinically suspect chronic pancreatic insufficiency given repeated flareups of chronic pancreatitis. --Started on Creon    Cigarette smoking -Cessation education performed at bedside -Nicotine  patch   History of GCA -Followed by [facility] rheumatology, off steroid since May 2024      Subjective: Pt seen awake sitting up in bed this AM.  She reports feeling like she was hit by a truck.  Asked for specifics and pt reports a headache this AM.  Continues to have abdominal pain, itnermittent nausea without vomiting. No fever or chills.  Pain is not escalating.   Physical Exam: Vitals:   09/28/23 0853 09/28/23 0903 09/28/23 1233 09/28/23 1548  BP: 106/84 (!) 94/57 101/68 (!) 120/53  Pulse: 83 63    Resp: 20 18 20 18   Temp: 98 F (36.7 C) 98 F (36.7 C) 98 F (36.7 C) (!) 97.5 F (36.4 C)  TempSrc: Oral Oral Oral Oral  SpO2: 96% 97%  Weight:      Height:       General exam: awake, appears very drowsy, no acute distress HEENT: heavy eyelids, eyes periodically roll back, moist mucus membranes, hearing grossly normal Respiratory system: on room air, normal respiratory effort. Cardiovascular system: RRR, no  pedal edema.   Gastrointestinal system: soft, mild epigastric tenderness without rebound or guarding, non-distended, hypoactive bowel sounds Central nervous system: A&O x 2+. no gross focal neurologic deficits, normal speech Extremities: moves all, no edema, normal tone Psychiatry: normal mood, flat affect   Data Reviewed:  Notable labs --   Na 130 >> 131 >> 133 Cl 91 >> 92 >> 97 Ca 8.0 Albumin  2.1 improved  Lipase 80 >> 81 >> 74 >> 190    Family Communication: Pt's mother at bedside 2/7.  Pt is able to update.  Disposition: Status is: Inpatient Remains inpatient appropriate because: ongoing pancreatitis, not tolerating much PO intake, still requiring IV fluids and  medications as above   Planned Discharge Destination: Home    Time spent: 40 minutes     Author: Burnard DELENA Cunning, DO 09/28/2023 4:05 PM  For on call review [email or web address].

## 2023-09-29 ENCOUNTER — Inpatient Hospital Stay: Payer: Medicare HMO

## 2023-09-29 DIAGNOSIS — K861 Other chronic pancreatitis: Secondary | ICD-10-CM | POA: Diagnosis not present

## 2023-09-29 DIAGNOSIS — E871 Hypo-osmolality and hyponatremia: Secondary | ICD-10-CM | POA: Diagnosis not present

## 2023-09-29 LAB — BLOOD GAS, VENOUS
Acid-Base Excess: 3 mmol/L — ABNORMAL HIGH (ref 0.0–2.0)
Bicarbonate: 28.5 mmol/L — ABNORMAL HIGH (ref 20.0–28.0)
O2 Saturation: 97 %
Patient temperature: 37
pCO2, Ven: 46 mm[Hg] (ref 44–60)
pH, Ven: 7.4 (ref 7.25–7.43)
pO2, Ven: 79 mm[Hg] — ABNORMAL HIGH (ref 32–45)

## 2023-09-29 LAB — BASIC METABOLIC PANEL
Anion gap: 8 (ref 5–15)
BUN: <5 mg/dL — ABNORMAL LOW (ref 6–20)
CO2: 25 mmol/L (ref 22–32)
Calcium: 7.5 mg/dL — ABNORMAL LOW (ref 8.9–10.3)
Chloride: 97 mmol/L — ABNORMAL LOW (ref 98–111)
Creatinine, Ser: 0.38 mg/dL — ABNORMAL LOW (ref 0.44–1.00)
GFR, Estimated: >60 mL/min (ref >60)
Glucose, Bld: 99 mg/dL (ref 70–99)
Potassium: 3.6 mmol/L (ref 3.5–5.1)
Sodium: 130 mmol/L — ABNORMAL LOW (ref 135–145)

## 2023-09-29 LAB — BRAIN NATRIURETIC PEPTIDE: B Natriuretic Peptide: 18.3 pg/mL (ref 0.0–100.0)

## 2023-09-29 LAB — LIPASE, BLOOD: Lipase: 148 U/L — ABNORMAL HIGH (ref 11–51)

## 2023-09-29 LAB — AMMONIA: Ammonia: 21 umol/L (ref 9–35)

## 2023-09-29 NOTE — Progress Notes (Signed)
 Patient has not had any pain medication since 2012 last night. Pt seemed very out of it, barely keeping her eyes open and keeping head up. Assisted to bathroom and up to recliner for about 1 hour. Pt requested cheese omelette and potatoes for breakfast. Pt stating she is having pain in left side and back. Repositioning and comfort measures provided. Dr. Fausto updated.

## 2023-09-29 NOTE — Progress Notes (Signed)
 Hospital progress update  Notified by RN that patient's mental status seem different than earlier in the day and compared to prior days.  Pt sleeping for the most part, but wakes easily.  No focal neurologic deficits, remains oriented and ambulatory to the [facility].    Of note, pt's mother reports that at home at patient's baseline, she sleeps most of the day, wakes up to take medicine, and then goes back to sleep.  This has been observed since admission.  Pt has been refusing some of her anti-epileptics, citing recent changes by her neurologist.   Meds currently ordered are based upon outpatient neurology office visit on 09/17/23.     Given apparent change in mentation, evaluation as follows:  --CT head w/o contrast -- negative.  --EEG tomorrow -- suspect possible subclinical seizures and post-ictal state given pt refusing her medications.

## 2023-09-29 NOTE — Progress Notes (Signed)
 Pt refused Dilantin  and Vimpat  stating "Dr. Mason Sole told me to stop them and I've been tapering off. That's why I am so out of it" Dr. Antoniette Batty made aware.

## 2023-09-29 NOTE — Progress Notes (Signed)
 Patient has been very sleepy throughout the day- not interested in eating/drinking/getting out of bed. Mother has been at bedside since 30 am. Dr. Fausto made aware patient has not made much progress today but has only required Toradol  this morning for pain.

## 2023-09-29 NOTE — Progress Notes (Signed)
 Progress Note   Patient: Abigail [name] [facility] FMW:[id number] DOB: [birth year] DOA: 09/24/2023     4 DOS: the patient was seen and examined on 09/29/2023   Brief hospital course:  [name] is a 58 y.o. female with medical history significant of recurrent pancreatitis with pseudocyst formation, seizure disorder, chronic pain and narcotic dependence, GCA, asthma/COPD, chronic hypoxic respiratory failure on 2 L continuously, GCA off steroid since May 2024, sent from [facility] office for evaluation of recurrent pancreatitis.   Pt was admitted for further evaluation and management.  Being treated with supportive care including pain control, antiemetics, and initially kept NPO.  2/5 - tolerating some clear liquids. Ongoing nausea and epigastric pain. No dry heaves this AM per pt.   Assessment and Plan:  Acute on chronic pancreatitis With chronic pseudocyst formation.  According to [facility] GI note, etiology appears to be idiopathic. Patient was hospitalized two times previously since November 2024.  Appears to have been having increasing size of multiple pancreatic pseudocysts.  Unfortunately, patient has lost follow-up with [facility] for 1+ year.  Pt sent to ER from local GI clinic due to severity of symptoms, hypokalemia and hypotension.   [facility] GI records reviewed in Care Everywhere 2/4 CT abdomen pelvis with contrast showed changes of acute on chronic pancreatitis with multiple peripancreatic collections increased in size and number from prior study, peripancreatic edema, reactive mesenteric/omental edema. 2/6 -- changed PO morphine  to PO dilaudid , pt did not require any IV dilaudid  2/7 -- lower pain scores, IV dilaudid  dc'd not used in over 24 hours, diet advanced 2/8 -- lipase up with diet advanced, added IV Toradol  due to oversedation 2/9 -- ongoing pain, lipase down a 190 >> 148 --Revert diet to FULL LIQUIDS  --Pain control and antiemetics PRN --NO IV NARCOTICS --IV Toradol  for  breakthrough pain (pt reports not allergic, has received Toradol  multiple times in past without issue) --Creon  --IV fluids - continue given hyponatremia --Monitor CMP, lipase levels --No signs of infection of necrosis of known fluid collections, no surgical or procedure intervention is indicated --Follow up with GI at [facility] as outpatient --Follow up with outpatient Pain Management  Acute metabolic encephalopathy -- intermittent since admission, in setting of opioids for pain. Chart reviewed and note prior hospital admissions for same -- oversedation appearing from medications.  Pt's mother reported that, at home, pt sleeps, wakes up for pain medication and then sleeps more.  This appears to be a chronic long-term issue. I have some concern patient may be taking her home pain medications in the room.   --Minimize sedating meds --Ruled out hypercarbia, hyperammonemia  --Delirium precautions   Hypokalemia - Replaced.  Due to poor PO intake in setting of pancreatitis. --Monitor BMP, Mg & replace as needed  Hyponatremia - Na 130 >> 131 >> 133 >> 130 today --Stop IV fluids -- ?if getting some volume overload --Monitor BMP --Encourage PO intake  Leukocytosis - suspect reactive, monitor off antibiotics Abnormal UA - contaminated sample with large # squamous epi cells Pt denies any UTI symptoms.   Monitor off antibiotics   Seizure disorder - stable --continue Keppra  Vimpat  and phenytoin  --Pharmacy to verify current meds as pt reported recent changes Neurology note from recent visit 09/17/23 reviewed in detail.  Medication changes noted and current orders reflect Dr. Jamal recommendations from that encounter.  Pt is frequently declining some of her seizure medications since admission. --Seizure precautions --IV Ativan  if seizure activity   Severe hypoalbuminemia Clinically suspect chronic  pancreatic insufficiency given repeated flareups of chronic pancreatitis. --Started on Creon     Cigarette smoking -Cessation education performed at bedside -Nicotine  patch   History of GCA -Followed by [facility] rheumatology, off steroid since May 2024      Subjective: Pt extremely drowsy this AM.  Note no dilaudid  or other sedating meds given since around 8PM last night.  Pt reports abdominal pain continues.  No fever/chills, escalating pain or other complaints.   Physical Exam: Vitals:   09/29/23 1000 09/29/23 1030 09/29/23 1100 09/29/23 1122  BP:    100/60  Pulse: 88 82 81   Resp:    17  Temp:    97.7 F (36.5 C)  TempSrc:    Oral  SpO2: 100% 100% 93%   Weight:      Height:       General exam: sleeping wakes to voice, no acute distress HEENT: eyes roll back, barely able to keep eyes open, moist mucus membranes, hearing grossly normal Respiratory system: on room air, normal respiratory effort. CTAB no wheezes or rhonchi Cardiovascular system: RRR, no pedal edema.   Gastrointestinal system: soft, non-tender, non-distended, hypoactive bowel sounds Central nervous system: O x 2+. no gross focal neurologic deficits, normal speech Extremities: moves all, no edema, normal tone Psychiatry: normal mood, flat affect   Data Reviewed:  Notable labs --   Na 130 >> 131 >> 133 >> 130 Cl 91 >> 92 >> 97 >> 97 Ca 7.5  Ammonia normal 21 VBG 7.4 / normal pCO2 46 BNP 18.3   Lipase 80 >> 81 >> 74 >> 190 >> 148    Family Communication: Pt's mother at bedside 2/7.  Pt is able to update.  Disposition: Status is: Inpatient Remains inpatient appropriate because: ongoing pancreatitis, not tolerating much PO intake, still requiring IV fluids and  medications as above   Planned Discharge Destination: Home    Time spent: 42 minutes     Author: Burnard DELENA Cunning, DO 09/29/2023 12:59 PM  For on call review [email or web address].

## 2023-09-30 ENCOUNTER — Ambulatory Visit: Payer: Medicare HMO

## 2023-09-30 DIAGNOSIS — K861 Other chronic pancreatitis: Secondary | ICD-10-CM | POA: Diagnosis not present

## 2023-09-30 DIAGNOSIS — R569 Unspecified convulsions: Secondary | ICD-10-CM | POA: Diagnosis not present

## 2023-09-30 LAB — BASIC METABOLIC PANEL
Anion gap: 9 (ref 5–15)
BUN: <5 mg/dL — ABNORMAL LOW (ref 6–20)
CO2: 29 mmol/L (ref 22–32)
Calcium: 7.9 mg/dL — ABNORMAL LOW (ref 8.9–10.3)
Chloride: 97 mmol/L — ABNORMAL LOW (ref 98–111)
Creatinine, Ser: 0.48 mg/dL (ref 0.44–1.00)
GFR, Estimated: >60 mL/min (ref >60)
Glucose, Bld: 81 mg/dL (ref 70–99)
Potassium: 3.7 mmol/L (ref 3.5–5.1)
Sodium: 135 mmol/L (ref 135–145)

## 2023-09-30 LAB — LIPASE, BLOOD: Lipase: 142 U/L — ABNORMAL HIGH (ref 11–51)

## 2023-09-30 LAB — LIPID PANEL
Cholesterol: 151 mg/dL (ref 0–200)
HDL: 23 mg/dL — ABNORMAL LOW (ref >40)
LDL Cholesterol: 100 mg/dL — ABNORMAL HIGH (ref 0–99)
Total CHOL/HDL Ratio: 6.6 {ratio}
Triglycerides: 142 mg/dL (ref <150)
VLDL: 28 mg/dL (ref 0–40)

## 2023-09-30 LAB — PHOSPHORUS: Phosphorus: 3.1 mg/dL (ref 2.5–4.6)

## 2023-09-30 LAB — MAGNESIUM: Magnesium: 1.8 mg/dL (ref 1.7–2.4)

## 2023-09-30 MED ORDER — ENSURE ENLIVE PO LIQD
237.0000 mL | Freq: Three times a day (TID) | ORAL | Status: DC
  Administered 2023-09-30 – 2023-10-01 (×3): 237 mL via ORAL

## 2023-09-30 NOTE — Progress Notes (Signed)
 Went in room to see if pt needed to use the bathroom. Getting ready to get patient up and her room phone rang. Pt stated probably her son. Pt instructed to call out when finished talking to son so we can help her to BR. Pt verbalized understanding. Call light within reach

## 2023-09-30 NOTE — Progress Notes (Signed)
 Pt more alert this shift. Still refuses Dilantin  and Vimpat  stating they "knock her out a couple days" when she takes them.   C/o back and shoulder pain with some intermittent left abdomen pain/spasms- managed with PRN Dilaudid , Toradol , lidoderm  patch, and capsaicin  cream.   Up to recliner x2 hours today. Worked with PT who recommended walker for stabilization.   Has mainly drank lemonade today, half ensure and ate 25% of lunch tray. Mother at bedside supportive and helpful.

## 2023-09-30 NOTE — Progress Notes (Signed)
 Progress Note   Patient: Abigail [name] [facility] VHQ:[id number] DOB: [birth year] DOA: 09/24/2023     5 DOS: the patient was seen and examined on 09/30/2023   Brief hospital course: " [facility] is a 58 y.o. female with medical history significant of recurrent pancreatitis with pseudocyst formation, seizure disorder, chronic pain and narcotic dependence, GCA, asthma/COPD, chronic hypoxic respiratory failure on 2 L continuously, GCA off steroid since May 2024, sent from [facility] office for evaluation of recurrent pancreatitis. "  Pt was admitted for further evaluation and management.  Being treated with supportive care including pain control, antiemetics, and initially kept NPO.  2/5 - tolerating some clear liquids. Ongoing nausea and epigastric pain. No dry heaves this AM per pt.   Assessment and Plan:  Acute on chronic pancreatitis With chronic pseudocyst formation.  According to [facility] GI note, etiology appears to be idiopathic. Patient was hospitalized two times previously since November 2024.  Appears to have been having increasing size of multiple pancreatic pseudocysts.  Unfortunately, patient has lost follow-up with [facility] GI for 1+ year.  Pt sent to ER from local GI clinic due to severity of symptoms, hypokalemia and hypotension.   [facility] GI records reviewed in Care Everywhere 2/4 CT abdomen pelvis with contrast showed changes of acute on chronic pancreatitis with multiple peripancreatic collections increased in size and number from prior study, peripancreatic edema, reactive mesenteric/omental edema. 2/6 -- changed PO morphine  to PO dilaudid , pt did not require any IV dilaudid  2/7 -- lower pain scores, IV dilaudid  dc'd not used in over 24 hours, diet advanced 2/8 -- lipase up with diet advanced, added IV Toradol  due to oversedation 2/9 -- ongoing pain, lipase down a 190 >> 148 --Revert diet to FULL LIQUIDS  --Pain control and antiemetics PRN --NO IV NARCOTICS --IV Toradol  for  breakthrough pain (pt reports not allergic, has received Toradol  multiple times in past without issue) --Creon  --IV fluids - continue given hyponatremia --Monitor CMP, lipase levels --No signs of infection of necrosis of known fluid collections, no surgical or procedure intervention is indicated --Follow up with GI at [facility] as outpatient --Follow up with outpatient Pain Management  Acute metabolic encephalopathy -- intermittent since admission, in setting of opioids for pain. ?Subclinical seizures -- pt has intermittently refused her antiepileptic meds since admission. Chart reviewed and note prior hospital admissions for same -- oversedation appearing from medications.  Pt's mother reported that, at home, pt sleeps, wakes up for pain medication and then sleeps more.  This appears to be a chronic long-term issue. I have some concern patient may be taking her home pain medications in the room.   --Minimize sedating meds --Ruled out hypercarbia, hyperammonemia  --Delirium precautions --CT head - negative 2/9 --EEG - pending -- follow results --Seizure precautions   Hypokalemia - Resolved.  Due to poor PO intake in setting of pancreatitis. --Monitor BMP, Mg & replace as needed  Hyponatremia - Resolved with IV hydration --Monitor BMP --Encourage PO intake  Leukocytosis - suspect reactive, monitor off antibiotics Abnormal UA - contaminated sample with large # squamous epi cells Pt denies any UTI symptoms.   No antibiotics indicated. Monitor.   Seizure disorder - stable --continue Keppra  Vimpat  and phenytoin  --Pharmacy to verify current meds as pt reported recent changes Neurology note from recent visit 09/17/23 reviewed in detail.  Medication changes noted and current orders reflect Dr. Carlis Cherry recommendations from that encounter.  Pt is frequently declining some of her seizure medications since admission. --Seizure precautions --  IV Ativan  if seizure activity   Severe  hypoalbuminemia Clinically suspect chronic pancreatic insufficiency given repeated flareups of chronic pancreatitis. --Started on Creon    Cigarette smoking -Cessation education performed at bedside -Nicotine  patch   History of GCA -Followed by [facility] rheumatology, off steroid since May 2024      Subjective: Pt seen with mother at bedside this AM.  RN reports pt mentation is improved today. Pt barely taking any PO intake but is sipping liquids.  Wants regular diet but we discussed making sure she's tolerating full liquids first.  She did not have anything on breakfast tray today.   Physical Exam: Vitals:   09/30/23 0730 09/30/23 0736 09/30/23 0800 09/30/23 1124  BP:  120/60  (!) 99/55  Pulse: 82  75   Resp:  18  17  Temp:  98 F (36.7 C)  97.8 F (36.6 C)  TempSrc:  Oral  Oral  SpO2: 97%  100%   Weight:      Height:       General exam: awake, very drowsy appearing, no acute distress HEENT: heavy appearing eyelids, moist mucus membranes, hearing grossly normal Respiratory system: on room air, normal respiratory effort Cardiovascular system: RRR, no pedal edema.   Gastrointestinal system: soft, non-tender, non-distended Central nervous system: O x 3. no gross focal neurologic deficits, normal speech Extremities: moves all, no edema, normal tone Psychiatry: normal mood, flat affect   Data Reviewed:  Notable labs --   Na normalized K normalized Cl 97 BUN < 5 Ca 7.9  Ammonia normal 21 VBG 7.4 / normal pCO2 46 BNP 18.3   Lipase 80 >> 81 >> 74 >> 190 >> 148    Family Communication: Pt's mother at bedside 2/7, today 2/10.   Pt is capable to update.   Disposition: Status is: Inpatient Remains inpatient appropriate because: ongoing pancreatitis, not tolerating much PO intake, ongoing evaluation of mental status changes with EEG pending    Planned Discharge Destination: Home    Time spent: 40 minutes     Author: Montey Apa, DO 09/30/2023 3:04  PM  For on call review [email or web address].

## 2023-09-30 NOTE — Procedures (Signed)
 Routine EEG Report  Abigail [name] is a 58 y.o. female with a history of seizure who is undergoing an EEG to evaluate for seizures.   Report: This EEG was acquired with electrodes placed according to the International 10-20 electrode system (including Fp1, Fp2, F3, F4, C3, C4, P3, P4, O1, O2, T3, T4, T5, T6, A1, A2, Fz, Cz, Pz). The following electrodes were missing or displaced: none.   The best background was continuous at approximately 5-6 Hz with overriding beta frequencies. This activity is reactive to stimulation. Drowsiness was manifested by background fragmentation; deeper stages of sleep were identified by K complexes and sleep spindles. There was no focal slowing. There were generalized periodic discharges with triphasic morphology that waxed and waned, at time in runs of up to 1-1.5 Hz lasting 2-10 seconds. These did not appear epileptiform. There were no definitive interictal epileptiform discharges. There were no electrographic seizures identified. Photic stimulation and hyperventilation were not performed.   Impression and clinical correlation: This EEG was obtained while awake and asleep and is abnormal due to: - Moderate diffuse slowing indicative of global cerebral dysfunction - Triphasic waves most commonly seen in the setting of metabolic derangement.  - There were no definitive epileptiform abnormalities were not seen during this recording.  This EEG is unchanged from her most recent EEG 07/12/23.   Greg Leaks, MD [facility] [phone]

## 2023-09-30 NOTE — Progress Notes (Signed)
 Eeg done

## 2023-09-30 NOTE — Evaluation (Signed)
 Physical Therapy Evaluation Patient Details Name: Abigail [name] [facility] MRN: [id number] DOB: Nov 06, 1965 Today's Date: 09/30/2023  History of Present Illness  Pt admitted for pancreatitis. History includes seizures, narcotic use, asthma, COPD with 2L of O2 PRN at baseline.  Clinical Impression  Pt is a pleasant 58 year old female who was admitted for pancreatitis. Pt performs transfers/ambulation with CGA. Pt demonstrates deficits with strength/mobility. Pt very drowsy throughout session. Lunch tray arrived, however not interested at this time. Placed RW at bedside and encouraged to continue mobility with staff to assist in returning to independence. Of note, pt on 2L upon arrival, removed for mobility with desat to 81%- not symptomatic. Improved to 100% with 2L and seated rest break. Would benefit from skilled PT to address above deficits and promote optimal return to PLOF. Pt will continue to receive skilled PT services while admitted and will defer to TOC/care team for updates regarding disposition planning.       If plan is discharge home, recommend the following: A little help with walking and/or transfers;Assist for transportation;Help with stairs or ramp for entrance   Can travel by private vehicle        Equipment Recommendations None recommended by PT  Recommendations for Other Services       Functional Status Assessment Patient has had a recent decline in their functional status and demonstrates the ability to make significant improvements in function in a reasonable and predictable amount of time.     Precautions / Restrictions Precautions Precautions: Fall Restrictions Weight Bearing Restrictions Per Provider Order: No      Mobility  Bed Mobility               General bed mobility comments: NT, received in recliner    Transfers Overall transfer level: Needs assistance Equipment used: 1 person hand held assist Transfers: Sit to/from Stand Sit to Stand:  Contact guard assist           General transfer comment: safe technique. Needs HHA and increased sway noted during static standing    Ambulation/Gait Ambulation/Gait assistance: Contact guard assist Gait Distance (Feet): 8 Feet Assistive device: 1 person hand held assist Gait Pattern/deviations: Step-through pattern       General Gait Details: ambulated short distance in room with HHA. Anticipate needing RW for further distance secondary to deficits. After session, went and got a RW to place at bedside  Stairs            Wheelchair Mobility     Tilt Bed    Modified Rankin (Stroke Patients Only)       Balance Overall balance assessment: Needs assistance Sitting-balance support: Feet supported, Bilateral upper extremity supported Sitting balance-Leahy Scale: Good     Standing balance support: Single extremity supported Standing balance-Leahy Scale: Fair                               Pertinent Vitals/Pain Pain Assessment Pain Assessment: No/denies pain    Home Living Family/patient expects to be discharged to:: Private residence Living Arrangements: Children Available Help at Discharge: Family;Available PRN/intermittently Type of Home: Mobile home Home Access: Ramped entrance       Home Layout: One level Home Equipment: Agricultural consultant (2 wheels);Wheelchair - Set designer (4 wheels) Additional Comments: lives with son who is there 24/7    Prior Function Prior Level of Function : Needs assist  Mobility Comments: reports she uses the walker in home and rollator in community ADLs Comments: indep with ADLs     Extremity/Trunk Assessment   Upper Extremity Assessment Upper Extremity Assessment: Generalized weakness (B UE grossly 3+/5)    Lower Extremity Assessment Lower Extremity Assessment: Generalized weakness (B LE grossly 3+/5; notes neuropathy in B feet)       Communication    Communication Communication: No apparent difficulties  Cognition Arousal: Suspect due to medications Behavior During Therapy: Flat affect Overall Cognitive Status: Within Functional Limits for tasks assessed                                 General Comments: very flat affect, however agreeable to session        General Comments      Exercises     Assessment/Plan    PT Assessment Patient needs continued PT services  PT Problem List Decreased strength;Decreased activity tolerance;Decreased balance;Decreased mobility       PT Treatment Interventions DME instruction;Gait training;Therapeutic activities;Therapeutic exercise;Balance training    PT Goals (Current goals can be found in the Care Plan section)  Acute Rehab PT Goals Patient Stated Goal: to go home PT Goal Formulation: With patient Time For Goal Achievement: 10/14/23 Potential to Achieve Goals: Good    Frequency Min 1X/week     Co-evaluation               AM-PAC PT "6 Clicks" Mobility  Outcome Measure Help needed turning from your back to your side while in a flat bed without using bedrails?: A Little Help needed moving from lying on your back to sitting on the side of a flat bed without using bedrails?: A Little Help needed moving to and from a bed to a chair (including a wheelchair)?: A Little Help needed standing up from a chair using your arms (e.g., wheelchair or bedside chair)?: A Little Help needed to walk in hospital room?: A Little Help needed climbing 3-5 steps with a railing? : A Lot 6 Click Score: 17    End of Session Equipment Utilized During Treatment: Oxygen Activity Tolerance: Patient tolerated treatment well Patient left: in chair Nurse Communication: Mobility status PT Visit Diagnosis: Muscle weakness (generalized) (M62.81);Difficulty in walking, not elsewhere classified (R26.2);Unsteadiness on feet (R26.81)    Time: 0865-7846 PT Time Calculation (min) (ACUTE ONLY):  15 min   Charges:   PT Evaluation $PT Eval Low Complexity: 1 Low   PT General Charges $$ ACUTE PT VISIT: 1 Visit         Amparo Balk, PT, DPT, GCS [phone]   Reiko Vinje 09/30/2023, 3:10 PM

## 2023-10-01 DIAGNOSIS — K861 Other chronic pancreatitis: Secondary | ICD-10-CM | POA: Diagnosis not present

## 2023-10-01 LAB — BASIC METABOLIC PANEL
Anion gap: 11 (ref 5–15)
BUN: <5 mg/dL — ABNORMAL LOW (ref 6–20)
CO2: 28 mmol/L (ref 22–32)
Calcium: 8.1 mg/dL — ABNORMAL LOW (ref 8.9–10.3)
Chloride: 95 mmol/L — ABNORMAL LOW (ref 98–111)
Creatinine, Ser: 0.43 mg/dL — ABNORMAL LOW (ref 0.44–1.00)
GFR, Estimated: >60 mL/min (ref >60)
Glucose, Bld: 93 mg/dL (ref 70–99)
Potassium: 3.6 mmol/L (ref 3.5–5.1)
Sodium: 134 mmol/L — ABNORMAL LOW (ref 135–145)

## 2023-10-01 LAB — CBC
HCT: 35.1 % — ABNORMAL LOW (ref 36.0–46.0)
Hemoglobin: 11.3 g/dL — ABNORMAL LOW (ref 12.0–15.0)
MCH: 29.7 pg (ref 26.0–34.0)
MCHC: 32.2 g/dL (ref 30.0–36.0)
MCV: 92.1 fL (ref 80.0–100.0)
Platelets: 291 10*3/uL (ref 150–400)
RBC: 3.81 MIL/uL — ABNORMAL LOW (ref 3.87–5.11)
RDW: 14.8 % (ref 11.5–15.5)
WBC: 7 10*3/uL (ref 4.0–10.5)
nRBC: 0 % (ref 0.0–0.2)

## 2023-10-01 LAB — LIPASE, BLOOD: Lipase: 127 U/L — ABNORMAL HIGH (ref 11–51)

## 2023-10-01 MED ORDER — BOOST / RESOURCE BREEZE PO LIQD CUSTOM
1.0000 | Freq: Three times a day (TID) | ORAL | Status: DC
  Administered 2023-10-01 – 2023-10-02 (×3): 1 via ORAL

## 2023-10-01 NOTE — Progress Notes (Signed)
Initial Nutrition Assessment DOCUMENTATION CODES:  Non-severe (moderate) malnutrition in context of chronic illness (pancreatitis)  INTERVENTION:  For continued PO diet: Discontinue Ensure Enlive per pt preferences  Boost Breeze po TID, each supplement provides 250 kcal and 9 grams of protein  Offer soft PO diet if tolerance is continued  If determined tube feeding necessary: Initiate tube feeding via NG tube: Osmolite 1.5 at 55 ml/h, begin at 47ml/h and increase 10mL every 12 h to goal rate 110ml/h (1320 ml per day)  Prosource TF20 1x/day at 60 ml providing 80kcal and 20gPro  Provides 2060 kcal, 101.5 gm protein, 1065 ml free water daily   Begin thiamine 100mg  daily x 7 days and MVI supplement  Monitor Phos, Mg, and K labs and replete as needed due to high refeeding risk  NUTRITION DIAGNOSIS:  Moderate Malnutrition related to chronic illness (pancreatitis) as evidenced by moderate fat depletion, moderate muscle depletion, percent weight loss.  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 Pt hx of C5-7 fusions, L4-5 fusions, and S1 fusion. Pt admitted with belly pain for evaluation of recurrent pancreatitis after being sent from GI office.  Pt requested reg diet instead of liquid diet, diet advanced. Monitoring symptoms of chronic pancreatitis, seizure disorder, severe hypoalbuminemia, and acute metabolic encephalopathy. Resolved hypokalemia and hyponatremia with IV.   2/4 abdomen CT changes in chronic pancreatitis with increased size and number of multiple peripancreatic collections, peripancreatic edema; clear  liquid diet 2/6 PO advanced to full liquid diet 2/8 IV toradol added PRN 2/11 PO diet advanced to soft  Spoke with pt, but pt was very lethargic and unresponsive to most questions, had to repeat questions and nudge pt to get responses. Given pt's lethargy, unsure how accurate information is. Pt able to confirm she ate pancake and toast for breakfast (which is also confirmed by RN), but pt reports stomach pain after eating and is not hungry. Looked at lunch tray and pt had not touched the tray at all and also had not consumed any beverages off tray. Pt confirmed she did not like the Ensure Enlive and stated it was too thick.   Unable to confirm any diet history PTA at this time after asking multiple times.  Pt disclosed typical wt from the summertime as 200# and is currently 145#. Per chart review, pt has experienced 8.6% wt loss over 3 months which is significant for timeframe.  OT present during interview and had pt sit on side of bed which led to pt's heart rate rapidly increasing and needing to be laid back down. PT consult from yesterday showed pt was able to get up and walk, but mental status was improved then compared to today.  Discussed with pt that Boost Breeze is less thick than Ensure Enlive and more like juice if she wanted to try it instead, pt seemed agreeable.  Discussed with RN, OT, and MD about nutrition concerns with poor oral intake, malnutrition, and mental status. Recommended NG tube for enteral feeding and MD said they would pass recommendation onto the new MD tomorrow. Discussed pt at severe refeeding risk with care team and advised thiamine and MVI supplement if enteral  initiated.  Medications reviewed and include: folic acid, Ensure Enlive TID, creon, protonix, gabapentin, senna docusate, miralax Lovenox, keppra, modafinil   Labs reviewed: Ca 8.1, albumin 2.1, lipase 127  NUTRITION - FOCUSED PHYSICAL EXAM: Flowsheet Row Most Recent Value  Orbital Region Moderate  depletion  Upper Arm Region Moderate depletion  Thoracic and Lumbar Region No depletion  Buccal Region Moderate depletion  Temple Region Mild depletion  Clavicle Bone Region Moderate depletion  Clavicle and Acromion Bone Region Moderate depletion  Scapular Bone Region Moderate depletion  Dorsal Hand Moderate depletion  Patellar Region Mild depletion  Anterior Thigh Region Mild depletion  Posterior Calf Region Moderate depletion  Edema (RD Assessment) None  Hair Reviewed  Eyes Reviewed  Mouth Reviewed  Skin Reviewed  [bruising on arms/hands]  Nails Reviewed   Diet Order:   Diet Order             DIET SOFT Room service appropriate? Yes; Fluid consistency: Thin  Diet effective now                  EDUCATION NEEDS:  Not appropriate for education at this time (pt not mentated at time of assessment)  Skin:  Skin Assessment: Reviewed RN Assessment  Last BM:  2/11 last reported bm 2/10 type unknown  Height:  Ht Readings from Last 1 Encounters:  09/24/23 5\' 7"  (1.702 m)   Weight:  Wt Readings from Last 1 Encounters:  09/24/23 66 kg   Ideal Body Weight:  63.6 kg  BMI:  Body mass index is 22.8 kg/m.  Estimated Nutritional Needs:   Kcal:  2000-2200  Protein:  100-115g  Fluid:  2000-2217mL  Louis Meckel Dietetic Intern

## 2023-10-01 NOTE — Progress Notes (Signed)
RN in room during OT evaluation. Pt more lethargic than earlier in the day. O2 sat in 80s while out of bed on 2L of O2. When Pt got back into bed, her legs seemed to tremor and her arm was twitching. Pt was able to answer questions but took longer to respond than usual. Esaw Grandchild DO notified of pt status.

## 2023-10-01 NOTE — Evaluation (Signed)
Occupational Therapy Evaluation Patient Details Name: Abigail [name] [facility] MRN: [id number] DOB: 1965/10/10 Today's Date: 10/01/2023   History of Present Illness   History of Present Illness: Pt admitted for pancreatitis. History includes seizures, narcotic use, asthma, COPD with 2L of O2 PRN at baseline.     Clinical Impressions Chart reviewed, pt greeted in bed with RD present. Pt is lethargic, not answering questions. Improved with seated on edge of bed with pt with continued increased time for processing throughout. Pt HR then into the 190s bpm, returned to supine and in 80s bpm on monitor in the room. Pt then attempted another supine>sit with MOD A, STS with CGA-MIN A, amb to bathroom with RW with CGA with NT present to assist. Pt on RA during mobility to bathroom and on toilet, spo2 in the 80s. Pt reports she feels no different. Pt on 2L via [place] and spo2 appears >88% throughout however ?pleth. Pt continues to report no SOB. HR up to 170s bpm again on toilet however reading 100 on this therapist personal pulse ox. Pt returned to bed, monitor attachment changed by nursing and HR in 80s bpm. BP 120/77 in semi supine. MAX A required for LB dressing. Pt reports she lives with her son who helps her with whatever she needs. No family in room to confirm. After return to bed (seated on edge of bed then supine), pt is twitching and requiring more time for responses and frequent cues for sustained attention . Team notified of findings. Pt will benefit from acute OT to address deficits and to facilitate optimal ADL performance. OT will continue to follow.      If plan is discharge home, recommend the following:   A little help with walking and/or transfers;A little help with bathing/dressing/bathroom;Direct supervision/assist for medications management;Direct supervision/assist for financial management;Supervision due to cognitive status;Assistance with cooking/housework     Functional Status  Assessment   Patient has had a recent decline in their functional status and demonstrates the ability to make significant improvements in function in a reasonable and predictable amount of time.     Equipment Recommendations   None recommended by OT;Other (comment) (pt has recommended equipment)     Recommendations for Other Services         Precautions/Restrictions   Precautions Precautions: Fall Restrictions Weight Bearing Restrictions Per Provider Order: No     Mobility Bed Mobility Overal bed mobility: Needs Assistance Bed Mobility: Supine to Sit, Sit to Supine     Supine to sit: Mod assist, HOB elevated Sit to supine: Max assist, HOB elevated        Transfers Overall transfer level: Needs assistance Equipment used: Rolling walker (2 wheels) Transfers: Sit to/from Stand Sit to Stand: Contact guard assist, Min assist                  Balance Overall balance assessment: Needs assistance Sitting-balance support: Feet supported, Bilateral upper extremity supported Sitting balance-Leahy Scale: Fair     Standing balance support: Bilateral upper extremity supported, During functional activity, Reliant on assistive device for balance Standing balance-Leahy Scale: Fair                             ADL either performed or assessed with clinical judgement   ADL Overall ADL's : Needs assistance/impaired Eating/Feeding: Supervision/ safety;Sitting Eating/Feeding Details (indicate cue type and reason): drink lemonade after mobility Grooming: Wash/dry hands;Sitting;Supervision/safety  Lower Body Dressing: Maximal assistance;Sit to/from stand;Sitting/lateral leans Lower Body Dressing Details (indicate cue type and reason): doff underwear/donn new underwear; pt is incontinent Toilet Transfer: Rolling walker (2 wheels);Minimal assistance;Regular Teacher, adult education Details (indicate cue type and reason): intermittent vcs for  technique Toileting- Clothing Manipulation and Hygiene: Supervision/safety;Sitting/lateral lean Toileting - Clothing Manipulation Details (indicate cue type and reason): urinate on toilet, pt is incontinent prior to mobility     Functional mobility during ADLs: Contact guard assist;Rolling walker (2 wheels) (approx 15' two attempts)       Vision Patient Visual Report: No change from baseline       Perception         Praxis         Pertinent Vitals/Pain Pain Assessment Pain Assessment: 0-10 Pain Score: 8  Pain Location: neck/back pain Pain Descriptors / Indicators: Discomfort Pain Intervention(s): Limited activity within patient's tolerance, Monitored during session, Repositioned     Extremity/Trunk Assessment Upper Extremity Assessment Upper Extremity Assessment: Generalized weakness   Lower Extremity Assessment Lower Extremity Assessment: Generalized weakness   Cervical / Trunk Assessment Cervical / Trunk Assessment: Kyphotic   Communication Communication Communication: No apparent difficulties Factors Affecting Communication: Reduced clarity of speech   Cognition Arousal: Lethargic Behavior During Therapy: Flat affect Cognition: Cognition impaired     Awareness: Intellectual awareness impaired, Online awareness impaired Memory impairment (select all impairments): Short-term memory, 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, Non-declarative long-term memory, Geneticist, molecular long-term memory Attention impairment (select first level of impairment): Sustained attention Executive functioning impairment (select all impairments): Initiation, Organization, Sequencing, Reasoning, Problem solving                   Following commands: Intact       Cueing  General Comments   Cueing Techniques: Verbal cues;Tactile cues;Visual cues      Exercises Other Exercises Other Exercises: edu re: role of OT, role of rehab, discharge recommendations, importance of progressing mobiltiy   Shoulder  Instructions      Home Living Family/patient expects to be discharged to:: Private residence Living Arrangements: Children Available Help at Discharge: Family;Available PRN/intermittently Type of Home: Mobile home Home Access: Ramped entrance     Home Layout: One level     Bathroom Shower/Tub: Walk-in shower;Sponge bathes at baseline   Bathroom Toilet: Standard     Home Equipment: Agricultural consultant (2 wheels);Wheelchair - Set designer (4 wheels)          Prior Functioning/Environment Prior Level of Function : Needs assist       Physical Assist : ADLs (physical)     Mobility Comments: reports she uses the walker in home; pt only leaves house for doctors appt per her report; ADLs Comments: pt reports she performs feeding, grooming, dressing, toileting with MOD I, has not gotten in the shower "in awhile" uses the bath wipes instead; assist for IADLs from son, mom, or friend    OT Problem List: Decreased strength;Decreased activity tolerance;Decreased knowledge of use of DME or AE;Decreased safety awareness;Impaired balance (sitting and/or standing);Decreased cognition   OT Treatment/Interventions: Self-care/ADL training;DME and/or AE instruction;Therapeutic activities;Balance training;Therapeutic exercise;Energy conservation;Patient/family education      OT Goals(Current goals can be found in the care plan section)   Acute Rehab OT Goals Patient Stated Goal: go home OT Goal Formulation: With patient Time For Goal Achievement: 10/15/23 Potential to Achieve Goals: Good ADL Goals Pt Will Perform Grooming: with modified independence;sitting Pt Will Perform Lower Body Dressing: with modified independence;sitting/lateral leans;sit to/from stand Pt Will Transfer to Toilet:  with modified independence;ambulating Pt Will Perform Toileting - Clothing Manipulation and hygiene: with modified independence;sitting/lateral leans;sit to/from stand   OT  Frequency:  Min 1X/week    Co-evaluation              AM-PAC OT "6 Clicks" Daily Activity     Outcome Measure Help from another person eating meals?: A Little Help from another person taking care of personal grooming?: A Little Help from another person toileting, which includes using toliet, bedpan, or urinal?: A Little Help from another person bathing (including washing, rinsing, drying)?: A Lot Help from another person to put on and taking off regular upper body clothing?: A Lot Help from another person to put on and taking off regular lower body clothing?: A Lot 6 Click Score: 15   End of Session Equipment Utilized During Treatment: Rolling walker (2 wheels);Oxygen Nurse Communication: Mobility status  Activity Tolerance: Patient limited by lethargy Patient left: in bed;with call bell/phone within reach;with bed alarm set  OT Visit Diagnosis: Other abnormalities of gait and mobility (R26.89);Muscle weakness (generalized) (M62.81)                Time: 1610-9604 OT Time Calculation (min): 44 min Charges:  OT General Charges $OT Visit: 1 Visit OT Evaluation $OT Eval Moderate Complexity: 1 Mod  Oleta Mouse, OTD OTR/L  10/01/23, 3:44 PM

## 2023-10-01 NOTE — TOC Progression Note (Signed)
Transition of Care [facility]) - Progression Note    Patient Details  Name: Abigail [name] [facility] MRN: [id number] Date of Birth: July 19, 1966  Transition of Care [facility]) CM/SW Contact  Margarito Liner, LCSW Phone Number: 10/01/2023, 4:32 PM  Clinical Narrative: TOC continues to follow progress.    Expected Discharge Plan: Home w Home Health Services    Expected Discharge Plan and Services                                               Social Determinants of Health (SDOH) Interventions SDOH Screenings   Food Insecurity: Patient Unable To Answer (09/25/2023)  Recent Concern: Food Insecurity - Food Insecurity Present (07/02/2023)  Housing: Patient Unable To Answer (09/25/2023)  Transportation Needs: Patient Unable To Answer (09/25/2023)  Utilities: Patient Unable To Answer (09/25/2023)  Depression (PHQ2-9): High Risk (09/18/2023)  Tobacco Use: High Risk (09/24/2023)    Readmission Risk Interventions    09/26/2023    4:06 PM 07/31/2023    1:07 PM  Readmission Risk Prevention Plan  Transportation Screening Complete Complete  Medication Review Oceanographer) Complete Complete  PCP or Specialist appointment within 3-5 days of discharge  Complete  HRI or Home Care Consult  Complete  SW Recovery Care/Counseling Consult Complete Complete  Palliative Care Screening Not Applicable Not Applicable  Skilled Nursing Facility Not Applicable Not Applicable

## 2023-10-01 NOTE — Progress Notes (Signed)
Progress Note   Patient: Abigail [name] [facility] ZOX:[id number] DOB: [birth year] DOA: 09/24/2023     6 DOS: the patient was seen and examined on 10/01/2023   Brief [name] course: " Abigail [name] is a 58 y.o. female with medical history significant of recurrent pancreatitis with pseudocyst formation, seizure disorder, chronic pain and narcotic dependence, GCA, asthma/COPD, chronic hypoxic respiratory failure on 2 L continuously, GCA off steroid since May 2024, sent from [facility] office for evaluation of recurrent pancreatitis. "  Pt was admitted for further evaluation and management.  Being treated with supportive care including pain control, antiemetics, and initially kept NPO now advanced to soft diet.  Further [name] course and management as outlined below.  In summary, [name] course prolonged by persistent abdominal pain and poor PO intake, in addition to multifactorial encephalopathy as outlined below.     Assessment and Plan:  Acute on chronic pancreatitis With chronic pseudocyst formation.  According to [facility] GI note, etiology appears to be idiopathic. Patient was hospitalized two times previously since November 2024.  Appears to have been having increasing size of multiple pancreatic pseudocysts.  Unfortunately, patient has lost follow-up with [facility] GI for 1+ year.  Pt sent to ER from local GI clinic due to severity of symptoms, hypokalemia and hypotension.   [facility] GI records reviewed in Care Everywhere 2/4 CT abdomen pelvis with contrast showed changes of acute on chronic pancreatitis with multiple peripancreatic collections increased in size and number from prior study, peripancreatic edema, reactive mesenteric/omental edema. 2/6 -- changed PO morphine to PO dilaudid, pt did not require any IV dilaudid 2/7 -- lower pain scores, IV dilaudid dc'd not used in over 24 hours, diet advanced 2/8 -- lipase up with diet advanced, added IV Toradol due to oversedation 2/9--10 -- ongoing  pain, lipase down a 190 >> 148 >> 142 2/11 - Advance diet to soft / low fiber / low fat.  Improved PO intake this AM --Pain control and antiemetics PRN --NO IV NARCOTICS --IV Toradol for breakthrough pain (pt reports not allergic, has received Toradol multiple times in past without issue) --Creon --IV fluids - continue given hyponatremia --Monitor CMP, lipase levels --No signs of infection of necrosis of known fluid collections, no surgical or procedure intervention is indicated --Follow up with GI at [facility] as outpatient --Follow up with outpatient Pain Management --RD consulted for dietary education.     Hopefully pt can progress to tolerate adequate PO and avoid need to eventual G-J feeding tube  Acute metabolic encephalopathy -- intermittent since admission, in setting of opioids for pain. ?Subclinical seizures -- pt has intermittently refused her antiepileptic meds since admission. Chart reviewed and note prior [name] admissions for same -- oversedation appearing from medications.  Pt's mother reported that, at home, pt sleeps, wakes up for pain medication and then sleeps more.  This appears to be a chronic long-term issue. Possibly exacerbated by Vimpat(?) - dc'd given pt refusing and was noted very sedated for a day after dose she did take. Outpatient neurologist was tapering her off this, but was supposed to continue for another month. --Minimize sedating meds --Ruled out hypercarbia, hyperammonemia  --Delirium precautions --CT head - negative 2/9 --EEG 2/10 - diffuse slowing, no evidence of seizure activity --Seizure precautions   Hypokalemia - Resolved.  Due to poor PO intake in setting of pancreatitis. --Monitor BMP, Mg & replace as needed  Hyponatremia - Resolved with IV hydration --Monitor BMP --Encourage PO intake  Leukocytosis - suspect reactive, monitor  off antibiotics Abnormal UA - contaminated sample with large # squamous epi cells Pt denies any UTI symptoms.    --No antibiotics indicated. --Monitor.   Seizure disorder - stable --continue Keppra Vimpat and phenytoin --D/C Vimpat -- pt has refused since admission due to over-sedation --Pt also refusing phenytoin per nursing --Seizure precautions --IV Ativan PRN seizure activity  Neurology note from recent visit 09/17/23 reviewed in detail.  Medication changes noted and orders were updated to reflect Dr. Margaretmary Eddy recommendations from that encounter.  Pt continued to refuse Vimpat, now dc'd.   Severe hypoalbuminemia Clinically suspect chronic pancreatic insufficiency given repeated flareups of chronic pancreatitis. --Started on Creon   Cigarette smoking -Cessation education given -Nicotine patch   History of GCA - no acute issues -Followed by [name] rheumatology, off steroid since May 2024      Subjective: Pt awake but drowsy when seen this AM.  Reports she ate one pancake and a slice of toast for breakfast and so far tolerating.  No other complaints this AM. No F/C or N/V.   Physical Exam: Vitals:   10/01/23 0300 10/01/23 0500 10/01/23 0804 10/01/23 1200  BP:   97/66 96/62  Pulse: 94 93    Resp:   18 18  Temp:   97.9 F (36.6 C) 98.3 F (36.8 C)  TempSrc:   Oral Oral  SpO2: 100% 98%    Weight:      Height:       General exam: awake, drowsy appearing, no acute distress HEENT: heavy eyelids, moist mucus membranes, hearing grossly normal Respiratory system: on room air, normal respiratory effort, CTAB no wheezes or rhonchi Cardiovascular system: RRR, no pedal edema.  Normal S1/S2 Gastrointestinal system: soft, non-tender, non-distended, no guarding or rebound tenderness Central nervous system: O x 3. no gross focal neurologic deficits, normal speech Extremities: moves all, no edema, normal tone Psychiatry: normal mood, flat affect   Data Reviewed:  Notable labs --   Na 134 K normal Cl 95 BUN < 5 Cr 0.43 Ca 8.1   Lipase 80 >> 81 >> 74 >> 190 >> 148 >> 127     Family  Communication: Pt's mother at bedside 2/7, 2/10.  Not present on rounds today.  Pt is capable to update.   Disposition: Status is: Inpatient Remains inpatient appropriate because: ongoing pancreatitis.  Slowly improving, expect ready for d/c in 1-2 days.    Planned Discharge Destination: Home    Time spent: 38 minutes     Author: Pennie Banter, DO 10/01/2023 1:55 PM  For on call review [email or web address].

## 2023-10-02 DIAGNOSIS — E876 Hypokalemia: Secondary | ICD-10-CM | POA: Diagnosis not present

## 2023-10-02 DIAGNOSIS — E440 Moderate protein-calorie malnutrition: Secondary | ICD-10-CM | POA: Insufficient documentation

## 2023-10-02 DIAGNOSIS — K863 Pseudocyst of pancreas: Secondary | ICD-10-CM | POA: Diagnosis not present

## 2023-10-02 DIAGNOSIS — R569 Unspecified convulsions: Secondary | ICD-10-CM | POA: Diagnosis not present

## 2023-10-02 DIAGNOSIS — K859 Acute pancreatitis without necrosis or infection, unspecified: Secondary | ICD-10-CM | POA: Diagnosis not present

## 2023-10-02 DIAGNOSIS — K861 Other chronic pancreatitis: Secondary | ICD-10-CM | POA: Diagnosis not present

## 2023-10-02 LAB — BASIC METABOLIC PANEL
Anion gap: 9 (ref 5–15)
BUN: <5 mg/dL — ABNORMAL LOW (ref 6–20)
CO2: 28 mmol/L (ref 22–32)
Calcium: 7.7 mg/dL — ABNORMAL LOW (ref 8.9–10.3)
Chloride: 97 mmol/L — ABNORMAL LOW (ref 98–111)
Creatinine, Ser: 0.36 mg/dL — ABNORMAL LOW (ref 0.44–1.00)
GFR, Estimated: >60 mL/min (ref >60)
Glucose, Bld: 113 mg/dL — ABNORMAL HIGH (ref 70–99)
Potassium: 3.8 mmol/L (ref 3.5–5.1)
Sodium: 134 mmol/L — ABNORMAL LOW (ref 135–145)

## 2023-10-02 LAB — MAGNESIUM: Magnesium: 1.8 mg/dL (ref 1.7–2.4)

## 2023-10-02 MED ORDER — ADULT MULTIVITAMIN W/MINERALS CH
1.0000 | ORAL_TABLET | Freq: Every day | ORAL | Status: DC
  Administered 2023-10-03 – 2023-10-04 (×2): 1 via ORAL
  Filled 2023-10-02 (×2): qty 1

## 2023-10-02 MED ORDER — CARBAMAZEPINE ER 100 MG PO TB12
100.0000 mg | ORAL_TABLET | Freq: Two times a day (BID) | ORAL | Status: DC
  Administered 2023-10-02 – 2023-10-04 (×4): 100 mg via ORAL
  Filled 2023-10-02 (×5): qty 1

## 2023-10-02 MED ORDER — MORPHINE SULFATE 15 MG PO TABS
15.0000 mg | ORAL_TABLET | Freq: Four times a day (QID) | ORAL | Status: DC | PRN
  Administered 2023-10-02 – 2023-10-03 (×3): 15 mg via ORAL
  Filled 2023-10-02 (×4): qty 1

## 2023-10-02 NOTE — Plan of Care (Signed)

## 2023-10-02 NOTE — Plan of Care (Signed)

## 2023-10-02 NOTE — Progress Notes (Signed)
Physical Therapy Treatment Patient Details Name: Abigail [name] MRN: [id number] DOB: 1965/11/02 Today's Date: 10/02/2023   History of Present Illness Pt admitted for pancreatitis. History includes seizures, narcotic use, asthma, COPD with 2L of O2 PRN at baseline.    PT Comments  Pt is making gradual progress towards goals with ability to ambulate slightly further in room this date. Still fatigues quickly and requires 2L in order to maintain sats WNL. Pt fatigues quickly with exertion, unable to tolerate hallway distances at this time. At end of session, pt nauseous, starting vomiting. RN called and able to administer meds. No tremors noted this date and pt able to maintain alertness, although continues to be drowsy. Will continue to progress as able.    If plan is discharge home, recommend the following: A little help with walking and/or transfers;Assist for transportation;Help with stairs or ramp for entrance   Can travel by private vehicle        Equipment Recommendations  None recommended by PT    Recommendations for Other Services       Precautions / Restrictions Precautions Precautions: Fall Recall of Precautions/Restrictions: Intact Restrictions Weight Bearing Restrictions Per Provider Order: No     Mobility  Bed Mobility Overal bed mobility: Needs Assistance Bed Mobility: Supine to Sit, Sit to Supine     Supine to sit: Min assist     General bed mobility comments: needs assist for B LE management. Once seated at EOB, upright posture noted. 1L for mobility    Transfers Overall transfer level: Needs assistance Equipment used: Rolling walker (2 wheels) Transfers: Sit to/from Stand Sit to Stand: Contact guard assist, Min assist           General transfer comment: safe technique with cues for hand placement. No dizziness    Ambulation/Gait Ambulation/Gait assistance: Contact guard assist Gait Distance (Feet): 40 Feet Assistive device: Rolling  walker (2 wheels) Gait Pattern/deviations: Step-through pattern       General Gait Details: ambualted with slow gait. Becomes SOB and with seated rest break 1/2 way, sats at 83% on 1L, increased to 2L with sats improving to 99% and then finished ambulation on 2L with sats WNL. HR increases to mid 140s with exertion. Very fatigued with ambulation, reports unable to tolerate hallway distances   Stairs             Wheelchair Mobility     Tilt Bed    Modified Rankin (Stroke Patients Only)       Balance Overall balance assessment: Needs assistance Sitting-balance support: Feet supported, Bilateral upper extremity supported Sitting balance-Leahy Scale: Fair     Standing balance support: Bilateral upper extremity supported, During functional activity, Reliant on assistive device for balance Standing balance-Leahy Scale: Fair                              Hotel manager: No apparent difficulties  Cognition Arousal: Lethargic Behavior During Therapy: Flat affect   PT - Cognitive impairments: No apparent impairments                       PT - Cognition Comments: pt alert and pleasant, just very tired Following commands: Intact      Cueing Cueing Techniques: Verbal cues, Tactile cues, Visual cues  Exercises Other Exercises Other Exercises: ambulated to bathroom with ability to transfer on/off low toilet. Pt able to perform self hygiene.  General Comments        Pertinent Vitals/Pain Pain Assessment Pain Assessment: Faces Faces Pain Scale: Hurts little more Pain Location: stomach Pain Descriptors / Indicators: Aching, Discomfort (nausea) Pain Intervention(s): Limited activity within patient's tolerance    Home Living                          Prior Function            PT Goals (current goals can now be found in the care plan section) Acute Rehab PT Goals Patient Stated Goal: to go home PT Goal  Formulation: With patient Time For Goal Achievement: 10/14/23 Potential to Achieve Goals: Good Progress towards PT goals: Progressing toward goals    Frequency    Min 1X/week      PT Plan      Co-evaluation              AM-PAC PT "6 Clicks" Mobility   Outcome Measure  Help needed turning from your back to your side while in a flat bed without using bedrails?: A Little Help needed moving from lying on your back to sitting on the side of a flat bed without using bedrails?: A Little Help needed moving to and from a bed to a chair (including a wheelchair)?: A Little Help needed standing up from a chair using your arms (e.g., wheelchair or bedside chair)?: A Little Help needed to walk in hospital room?: A Little Help needed climbing 3-5 steps with a railing? : A Lot 6 Click Score: 17    End of Session Equipment Utilized During Treatment: Gait belt;Oxygen Activity Tolerance: Patient tolerated treatment well Patient left: in chair;with chair alarm set Nurse Communication: Mobility status PT Visit Diagnosis: Muscle weakness (generalized) (M62.81);Difficulty in walking, not elsewhere classified (R26.2);Unsteadiness on feet (R26.81)     Time: 1610-9604 PT Time Calculation (min) (ACUTE ONLY): 25 min  Charges:    $Gait Training: 8-22 mins $Therapeutic Activity: 8-22 mins PT General Charges $$ ACUTE PT VISIT: 1 Visit                     Elizabeth Palau, PT, DPT, GCS [phone]    Abigail [name] 10/02/2023, 11:30 AM

## 2023-10-02 NOTE — Progress Notes (Addendum)
Progress Note   Patient: Abigail [name] [facility] WUJ:[id number] DOB: 13-Feb-1966 DOA: 09/24/2023     7 DOS: the patient was seen and examined on 10/02/2023   Brief hospital course: " [facility] is a 58 y.o. female with medical history significant of recurrent pancreatitis with pseudocyst formation, seizure disorder, chronic pain and narcotic dependence, GCA, asthma/COPD, chronic hypoxic respiratory failure on 2 L continuously, GCA off steroid since May 2024, sent from [facility] office for evaluation of recurrent pancreatitis. "  Pt was admitted for further evaluation and management.  Being treated with supportive care including pain control, antiemetics, and initially kept NPO now advanced to soft diet.  Further hospital course and management as outlined below.  In summary, hospital course prolonged by persistent abdominal pain and poor PO intake, in addition to multifactorial encephalopathy as outlined below.   2/12: Changed her pain regimen from Dilaudid to her home regimen in anticipation of discharge, neuro consult    Assessment and Plan:  Acute on chronic pancreatitis With chronic pseudocyst formation.  According to [facility] GI note, etiology appears to be idiopathic. Patient was hospitalized two times previously since November 2024.  Appears to have been having increasing size of multiple pancreatic pseudocysts.  Unfortunately, patient has lost follow-up with [facility] GI for 1+ year.  Pt sent to ER from local GI clinic due to severity of symptoms, hypokalemia and hypotension.   [facility] GI records reviewed in Care Everywhere 2/4 CT abdomen pelvis with contrast showed changes of acute on chronic pancreatitis with multiple peripancreatic collections increased in size and number from prior study, peripancreatic edema, reactive mesenteric/omental edema. 2/6 -- changed PO morphine to PO dilaudid, pt did not require any IV dilaudid 2/7 -- lower pain scores, IV dilaudid dc'd not used in over 24  hours, diet advanced 2/8 -- lipase up with diet advanced, added IV Toradol due to oversedation 2/9--10 -- ongoing pain, lipase down a 190 >> 148 >> 142 2/11 - Advance diet to soft / low fiber / low fat.  Improved PO intake this AM 2/12: Changed her pain regimen from Dilaudid to her home regimen in anticipation of discharge, pending neurology consult --NO IV NARCOTICS --IV Toradol for breakthrough pain (pt reports not allergic, has received Toradol multiple times in past without issue) --Creon --IV fluids - continue given hyponatremia --Monitor CMP, lipase levels --No signs of infection of necrosis of known fluid collections, no surgical or procedure intervention is indicated --Follow up with GI at [facility] as outpatient --Follow up with outpatient Pain Management.  Follow at the clinic in the room --RD consulted for dietary education.   -I have discussed with her mother and also encourage patient to take adequate oral nutrition  Acute metabolic encephalopathy -- intermittent since admission, in setting of opioids for pain. ?Subclinical seizures -- pt has intermittently refused her antiepileptic meds since admission. Chart reviewed and note prior hospital admissions for same -- oversedation appearing from medications.  Pt's mother reported that, at home, pt sleeps, wakes up for pain medication and then sleeps more.  This appears to be a chronic long-term issue. Possibly exacerbated by Vimpat(?) - dc'd given pt refusing and was noted very sedated for a day after dose she did take. Outpatient neurologist was tapering her off this, but was supposed to continue for another month. --Minimize sedating meds --Ruled out hypercarbia, hyperammonemia  --Delirium precautions --CT head - negative 2/9 --EEG 2/10 - diffuse slowing, no evidence of seizure activity --Seizure precautions   Hypokalemia -  Resolved.  Due to poor PO intake in setting of pancreatitis. --Monitor BMP, Mg & replace as  needed  Hyponatremia - Resolved with IV hydration --Monitor BMP --Encourage PO intake  Leukocytosis - suspect reactive, monitor off antibiotics Abnormal UA - contaminated sample with large # squamous epi cells Pt denies any UTI symptoms.   --No antibiotics indicated. --Monitor.   Seizure disorder - stable --continue Keppra Vimpat and phenytoin --D/C Vimpat -- pt has refused since admission due to over-sedation --Pt also refusing phenytoin per nursing --Seizure precautions --IV Ativan PRN seizure activity -Neurology consult to decide on AED  Neurology note from recent visit 09/17/23 reviewed in detail.  Medication changes noted and orders were updated to reflect Dr. Margaretmary Eddy recommendations from that encounter.  Pt continued to refuse Vimpat, now dc'd.   Severe hypoalbuminemia Clinically suspect chronic pancreatic insufficiency given repeated flareups of chronic pancreatitis. --Started on Creon   Cigarette smoking -Cessation education given -Nicotine patch   History of GCA - no acute issues -Followed by [facility] rheumatology, off steroid since May 2024      Subjective: Pt awake but drowsy -agreeable to cut back on her pain medicine back to her home regimen.  She shared that she will try to eat as much as she can but does not have appetite at times   Physical Exam: Vitals:   10/01/23 1629 10/01/23 1735 10/01/23 2015 10/02/23 0425  BP: (!) 95/53 (!) 103/49 (!) 105/50 (!) 109/55  Pulse:  80 80 94  Resp: 20 18 18 16   Temp: 98 F (36.7 C) 98.2 F (36.8 C) 98.2 F (36.8 C) 98.7 F (37.1 C)  TempSrc: Oral Oral  Oral  SpO2:  99% 99% 95%  Weight:      Height:       General exam: awake, drowsy appearing, no acute distress HEENT: heavy eyelids, moist mucus membranes, hearing grossly normal Respiratory system: on room air, normal respiratory effort, CTAB no wheezes or rhonchi Cardiovascular system: RRR, no pedal edema.  Normal S1/S2 Gastrointestinal system: soft, non-tender,  non-distended, no guarding or rebound tenderness Central nervous system: O x 3. no gross focal neurologic deficits, normal speech Extremities: moves all, no edema, normal tone Psychiatry: normal mood, flat affect   Data Reviewed:  Notable labs --   Na 134 K normal Cl 95 BUN < 5 Cr 0.43 Ca 8.1   Lipase 80 >> 81 >> 74 >> 190 >> 148 >> 127     Family Communication: Pt's mother at bedside 2/7, 2/10.  Not present on rounds today.  Pt is capable to update.   Disposition: Status is: Inpatient Remains inpatient appropriate because: ongoing pancreatitis.  Slowly improving, expect ready for d/c in 1-2 days.    Planned Discharge Destination: Home with home health    Time spent: 35 minutes     Author: Delfino Lovett, MD 10/02/2023 1:23 PM  For on call review [email or web address].

## 2023-10-03 DIAGNOSIS — E876 Hypokalemia: Secondary | ICD-10-CM | POA: Diagnosis not present

## 2023-10-03 DIAGNOSIS — K859 Acute pancreatitis without necrosis or infection, unspecified: Secondary | ICD-10-CM | POA: Diagnosis not present

## 2023-10-03 DIAGNOSIS — K861 Other chronic pancreatitis: Secondary | ICD-10-CM | POA: Diagnosis not present

## 2023-10-03 DIAGNOSIS — K863 Pseudocyst of pancreas: Secondary | ICD-10-CM | POA: Diagnosis not present

## 2023-10-03 MED ORDER — ENSURE MAX PROTEIN PO LIQD
11.0000 [oz_av] | Freq: Two times a day (BID) | ORAL | Status: DC
  Administered 2023-10-03 – 2023-10-04 (×2): 11 [oz_av] via ORAL
  Filled 2023-10-03: qty 330

## 2023-10-03 MED ORDER — MORPHINE SULFATE 15 MG PO TABS
15.0000 mg | ORAL_TABLET | Freq: Three times a day (TID) | ORAL | Status: DC | PRN
  Administered 2023-10-03 – 2023-10-04 (×5): 15 mg via ORAL
  Filled 2023-10-03 (×5): qty 1

## 2023-10-03 NOTE — Care Management Obs Status (Signed)
MEDICARE OBSERVATION STATUS NOTIFICATION   Patient Details  Name: Abigail [name] [facility] MRN: [id number] Date of Birth: Jul 06, 1966   Medicare Observation Status Notification Given:  Yes    Sherilyn Banker 10/03/2023, 11:39 AM

## 2023-10-03 NOTE — Plan of Care (Signed)

## 2023-10-03 NOTE — Plan of Care (Signed)
Problem: Education: Goal: Knowledge of General Education information will improve Description Including pain rating scale, medication(s)/side effects and non-pharmacologic comfort measures Outcome: Progressing   Problem: Health Behavior/Discharge Planning: Goal: Ability to manage health-related needs will improve Outcome: Progressing

## 2023-10-03 NOTE — Progress Notes (Addendum)
Nutrition Follow-up DOCUMENTATION CODES:  Non-severe (moderate) malnutrition in context of chronic illness (pancreatitis)  INTERVENTION:  Pt at high refeed risk; recommend monitor potassium, magnesium and phosphorus labs daily until stable and MVI PO daily, thiamine 100mg  for 7 days PO if PO intake improves.  Ensure Max po BID, each supplement provides 150 kcal and 30 grams of protein. Pt prefers chocolate flavor over ice.  Offer reg diet, low fat PO TID. Discussed importance of eating PO to aid in recovery and intake of calories and protein.  NUTRITION DIAGNOSIS:  Moderate Malnutrition related to chronic illness (pancreatitis) as evidenced by moderate fat depletion, moderate muscle depletion, percent weight loss.  GOAL:  Patient will meet greater than or equal to 90% of their needs  MONITOR:  PO intake, Supplement acceptance  ASSESSMENT:  58 y/o female with h/o GERD, anxiety, depression, chronic pain, opiate abuse, smoker, DDD, asthma, seizures, IBS-C, H. pylori, fibromyalgia, RA, GCA, hypothyroidism and chronic pancreatitis who is admitted with AMS and recurrent acute on chronic pancreatitis.  Spoke with pt who was awake and alert, able to answer questions. Mentation has improved since last interview. Pt reports she has not been able to eat PO diet due to continued pain and low appetite. Diet summary shows 0-5% of meals eaten over the last 7 days. Discussed with pt the importance of eating to aid in recovery and how she needs calories and protein to support recovery. Pt reports drinking water and some lemonade and reported not liking Boost Breeze. Adjusted order to Ensure Max per pt request and she wants chocolate flavor over ice.   Due to poor PO intake > 7 days, our recommendation is for an NG tube to be placed for nutrition support. Discussed recommendation with MD, but at this time, it's been decided to hold off on placement.   Pt agreeable to try to eat breakfast and lunch today to  avoid tube placement. If feeding is improved or NG tube feeding is initiated, Pt at high refeed risk; recommend monitor potassium, magnesium and phosphorus labs daily until stable and MVI PO daily, thiamine 100mg  for 7 days PO  Medications reviewed and include: capsaicin, lovenox, gabapentin, keppra, protonix Miralax, Creon, MVI with minerals, folic acid, Boost Breeze TID (d/c change to Ensure Max BID)  Labs reviewed: Potassium- 3.8, Phosphorus- 3.1, Magnesium- 1.8  Diet Order:   Diet Order             Diet regular Room service appropriate? Yes; Fluid consistency: Thin  Diet effective now                  EDUCATION NEEDS:  Education needs have been addressed  Skin:  Skin Assessment: Reviewed RN Assessment  Last BM:  2/11 last reported bm 2/10 type unknown  Height:  Ht Readings from Last 1 Encounters:  09/24/23 5\' 7"  (1.702 m)   Weight:  Wt Readings from Last 1 Encounters:  10/03/23 67.2 kg   Ideal Body Weight:  63.6 kg  BMI:  Body mass index is 23.2 kg/m.  Estimated Nutritional Needs:  Kcal:  2000-2200  Protein:  100-115g  Fluid:  2000-2249mL  Louis Meckel Dietetic Intern

## 2023-10-03 NOTE — Consult Note (Signed)
NEUROLOGY CONSULT NOTE   Date of service: October 03, 2023 Patient Name: Abigail [name] [facility] MRN:  [id number] DOB:  [birth year] Chief Complaint: refusing AEDs Requesting Provider: Delfino Lovett, MD  History of Present Illness   [facility]ail [name]em is a 58 y.o. female with hx of seizure, chronic pain and narcotic dependence, GCA off  steroids since 12/2022, asthma/COPD, c hronic hypoxic respiratory failure on 2L continuously who is admitted for acute on chronic pancreatitis. Neurology is consulted bc patient has been refusing her lacosamide.  She follows with Dr. Sherryll Burger neurology outpatient. She was previously on keppra, vimpat, phenytoin, and gabapentin. She told him the phenytoin and vimpat she was on were making her tired so he gave her the following wean schedule for both and uptitration schedule for carbamazepine.   His plan from note 09/17/23 was as follows:  - Taper phenytoin from 100 mg BID to 100 mg QD for two weeks, then discontinue - Simultaneously initiate carbamazepine at 50 mg BID for two weeks, then increase to 100 mg TWO TIMES A DAY Carbamazepine is an antiepileptic drug, used for many different conditions. The patient was advised not to stop the medication abruptly, not to use alcohol. Acute side-effects can be dizziness, sleepiness, fatigue, incoordination. Long-term use can cause low sodium, weight gain, bone loss, and edema. A rare but serious side-effect of Steven-Johnson Rash, liver toxicity, and bone marrow suppression have been reported. The patient should take Calcium, vitamin D, and folic acid supplement. - 1 month from now taper lacosamide from 200 mg BID to 100 mg BID for two weeks, then 100 mg QD for two weeks, then discontinue - Reassess seizure control and side effects in 3-4 months - Consider tapering levetiracetam if seizure control is stable and switch over to lamotrigine.   She self-discontinued lacosamide early bc it was making her too tired and she was  not taking it prior to admission. It was reordered on admission but she refused all but one dose.  At home she has been actually taking the following regimen: - Keppra 1500mg  bid - Carbamazepine 100mg  bid - Gabapentin 300mg  qid  She has not had any clinical seizures since her admission and routine EEG performed this hospitalization is stable from Nov 2024.    ROS   Unable to patient's lethargy and minimal participation  Past History   Past Medical History:  Diagnosis Date   Anxiety    Arthritis    Back pain    Basal cell carcinoma 10/04/2021   R axilla - needs excised 11/28/21   Basal cell carcinoma 10/04/2021   L antecubital excised 11/14/21   Diarrhea 11/12/2016   Fibromyalgia    Generalized abdominal pain 11/12/2016   H. pylori infection    Hyperlipidemia    IBS (irritable bowel syndrome)    Infectious colitis 04/29/2016   Migraines    Moderate dehydration 04/29/2016   Muscle pain    Opioid overdose (HCC) 12/07/2022   Reflux    Unexplained weight loss 11/12/2016    Past Surgical History:  Procedure Laterality Date   ABDOMINAL HYSTERECTOMY     APPENDECTOMY  2009   C5 FUSION     C6 FUSION     C7 FUSION     COLONOSCOPY  02/2006   COLONOSCOPY WITH PROPOFOL N/A 12/25/2016   Procedure: COLONOSCOPY WITH PROPOFOL;  Surgeon: Earline Mayotte, MD;  Location: [facility] ENDOSCOPY;  Service: Endoscopy;  Laterality: N/A;   ESOPHAGOGASTRODUODENOSCOPY (EGD) WITH PROPOFOL N/A 12/25/2016   Procedure: ESOPHAGOGASTRODUODENOSCOPY (  EGD) WITH PROPOFOL;  Surgeon: Earline Mayotte, MD;  Location: [facility] ENDOSCOPY;  Service: Endoscopy;  Laterality: N/A;   FOOT SURGERY     HIP ARTHROPLASTY     L4 FUSION     L5 FUSION     S1 FUSION      Family History: History reviewed. No pertinent family history.  Social History  reports that she has been smoking cigarettes. She has never used smokeless tobacco. She reports current alcohol use. She reports that she does not use drugs.  Allergies   Allergen Reactions   Nsaids Hives   Tapentadol Swelling, Rash and Other (See Comments)    Nucynta- Made her deathly sick   Cephalexin Other (See Comments)    Reaction not cited   Codeine Other (See Comments)    Reaction not cited   Darvocet [Propoxyphene N-Acetaminophen] Other (See Comments)    Reaction not cited   Latex Other (See Comments)    Reaction not cited   Silicone Other (See Comments)    Reaction not cited   Sulfa Antibiotics Hives   Tape Other (See Comments)    Reaction not cited   Meloxicam Rash    Medications   Current Facility-Administered Medications:    bisacodyl (DULCOLAX) EC tablet 5 mg, 5 mg, Oral, Daily PRN, Esaw Grandchild A, DO   capsaicin (ZOSTRIX) 0.025 % cream 1 Application, 1 Application, Topical, BID, Esaw Grandchild A, DO, 1 Application at 10/02/23 2157   carbamazepine (TEGRETOL XR) 12 hr tablet 100 mg, 100 mg, Oral, BID, Bing Neighbors M, MD, 100 mg at 10/02/23 2202   DULoxetine (CYMBALTA) DR capsule 60 mg, 60 mg, Oral, Daily, Chipper Herb, Ping T, MD, 60 mg at 10/02/23 0845   enoxaparin (LOVENOX) injection 40 mg, 40 mg, Subcutaneous, Q24H, Chipper Herb, Ping T, MD, 40 mg at 10/02/23 1745   feeding supplement (BOOST / RESOURCE BREEZE) liquid 1 Container, 1 Container, Oral, TID BM, Esaw Grandchild A, DO, 1 Container at 10/02/23 1959   folic acid (FOLVITE) tablet 1 mg, 1 mg, Oral, Daily, Chipper Herb, Ping T, MD, 1 mg at 10/02/23 0845   gabapentin (NEURONTIN) capsule 300 mg, 300 mg, Oral, QID, Mikey College T, MD, 300 mg at 10/02/23 2157   levETIRAcetam (KEPPRA) tablet 1,500 mg, 1,500 mg, Oral, BID, Mikey College T, MD, 1,500 mg at 10/02/23 2157   lidocaine (LIDODERM) 5 % 1 patch, 1 patch, Transdermal, Q24H, Mikey College T, MD, 1 patch at 10/02/23 1748   lipase/protease/amylase (CREON) capsule 24,000 Units, 24,000 Units, Oral, TID Adonis Brook T, MD, 24,000 Units at 10/02/23 1744   melatonin tablet 10 mg, 10 mg, Oral, QHS, Zhang, Ping T, MD, 10 mg at 09/30/23 2125   modafinil  (PROVIGIL) tablet 200 mg, 200 mg, Oral, Daily, Barrie Folk, RPH, 200 mg at 10/02/23 1243   morphine (MSIR) tablet 15 mg, 15 mg, Oral, QID PRN, Delfino Lovett, MD, 15 mg at 10/03/23 0334   multivitamin with minerals tablet 1 tablet, 1 tablet, Oral, Daily, Sherryll Burger, Vipul, MD   nicotine (NICODERM CQ - dosed in mg/24 hours) patch 21 mg, 21 mg, Transdermal, Daily, Mikey College T, MD, 21 mg at 10/02/23 0848   ondansetron (ZOFRAN) injection 4 mg, 4 mg, Intravenous, Q6H PRN, Mikey College T, MD, 4 mg at 10/02/23 1115   pantoprazole (PROTONIX) EC tablet 40 mg, 40 mg, Oral, BID AC, Mikey College T, MD, 40 mg at 10/02/23 1745   polyethylene glycol (MIRALAX / GLYCOLAX) packet 17 g, 17 g, Oral, Daily, Denton Lank,  Kelly A, DO, 17 g at 09/29/23 4098   senna-docusate (Senokot-S) tablet 1 tablet, 1 tablet, Oral, BID, Pennie Banter, DO, 1 tablet at 10/02/23 2157   simethicone (MYLICON) chewable tablet 80 mg, 80 mg, Oral, QID PRN, Pennie Banter, DO, 80 mg at 09/28/23 1458  Vitals   Vitals:   10/02/23 1951 10/03/23 0334 10/03/23 0500 10/03/23 0715  BP: 123/62 (!) 117/54  130/81  Pulse: (!) 102 96  (!) 103  Resp: 17 17  20   Temp: 98.3 F (36.8 C) 98.3 F (36.8 C)  97.8 F (36.6 C)  TempSrc: Oral Oral  Oral  SpO2:  93%  95%  Weight:   67.2 kg   Height:        Body mass index is 23.2 kg/m.  Physical Exam   Gen: patient lying in bed, NAD CV: extremities appear well-perfused Resp: normal WOB  Neurologic Examination   MS: lethargic, oriented to name and hospital, follows some simple commands but exam is limited 2/2 participation and lethargy and highly effort-dependent Speech: mild dysarthria, no aphasia CN: PERRL, blinks to threat bilat, EOMI, sensation intact, face symmetric, hearing intact to voice Motor: limited 2/2 participation but at least anti-gravity in all extremities Sensory: SILT Coordination: UTA Gait: deferred  Labs/Imaging/Neurodiagnostic studies   CBC:  Recent Labs  Lab  09/28/23 0518 10/01/23 0643  WBC 8.3 7.0  HGB 11.8* 11.3*  HCT 36.2 35.1*  MCV 93.8 92.1  PLT 306 291   Basic Metabolic Panel:  Lab Results  Component Value Date   NA 134 (L) 10/02/2023   K 3.8 10/02/2023   CO2 28 10/02/2023   GLUCOSE 113 (H) 10/02/2023   BUN <5 (L) 10/02/2023   CREATININE 0.36 (L) 10/02/2023   CALCIUM 7.7 (L) 10/02/2023   GFRNONAA >60 10/02/2023   GFRAA >60 01/15/2015   Lipid Panel:  Lab Results  Component Value Date   LDLCALC 100 (H) 09/30/2023   HgbA1c:  Lab Results  Component Value Date   HGBA1C 5.5 12/24/2022   Urine Drug Screen:     Component Value Date/Time   LABOPIA POSITIVE (A) 07/13/2023 0037   COCAINSCRNUR NONE DETECTED 07/13/2023 0037   LABBENZ POSITIVE (A) 07/13/2023 0037   AMPHETMU NONE DETECTED 07/13/2023 0037   THCU POSITIVE (A) 07/13/2023 0037   LABBARB NONE DETECTED 07/13/2023 0037    Alcohol Level     Component Value Date/Time   ETH <10 07/12/2023 0723   INR  Lab Results  Component Value Date   INR 1.2 07/29/2023   APTT  Lab Results  Component Value Date   APTT 37 (H) 07/29/2023   AED levels:  Lab Results  Component Value Date   PHENYTOIN <2.5 (L) 07/12/2023   LEVETIRACETA 68.5 (H) 07/12/2023    CT Head without contrast(Personally reviewed): Normal CT brain  Neurodiagnostics rEEG:  This EEG was obtained while awake and asleep and is abnormal due to: - Moderate diffuse slowing indicative of global cerebral dysfunction - Triphasic waves most commonly seen in the setting of metabolic derangement.  - There were no definitive epileptiform abnormalities were not seen during this recording. This EEG is unchanged from her most recent EEG 07/12/23.   ASSESSMENT   Naeemah [facility] Ferran is a 58 y.o. female with hx of seizure, chronic pain and narcotic dependence, GCA off  steroids since 12/2022, asthma/COPD, c hronic hypoxic respiratory failure on 2L continuously who is admitted for acute on chronic  pancreatitis. Neurology is consulted for AED mgmt. She  was previously on keppra, vimpat, phenytoin, and gabapentin (Jan 2025). She told her outpatient neurologist Dr. Sherryll Burger that the phenytoin and vimpat were making her tired so he gave her instructions for cross-titration (wean phenytoin over one month and then wean lacosamide over one month, and uptitrate carbamazepine). Patient self-discontinued vimpat early and has not been taking it at home or this admission. Weaning AEDs is done to reduce the risk of breakthrough seizure. She is outside the window for this. At home she was taking the regimen that Dr. Sherryll Burger wanted to gradually adjust to, so I will continue it. She has not had any clinical seizures since her admission and routine EEG performed this hospitalization is stable from Nov 2024.  RECOMMENDATIONS   - Continue keppra 1500mg  bid - Continue gabapentin 300mg  qid - Increase carbamazepine from 100mg  once daily to 100mg  bid (patient was on bid prior to admission) - Close outpatient f/u with Dr. Sherryll Burger ______________________________________________________________________    Signed, Jefferson Fuel, MD [facility]

## 2023-10-03 NOTE — Progress Notes (Signed)
Pt noted with O2 sats 88% on O2 @1liter , O2 increased to 3 liters. Pt now with O2 sat 93%.

## 2023-10-03 NOTE — Progress Notes (Signed)
Progress Note   Patient: Abigail [name] [facility] ZOX:[id number] DOB: 06-02-1966 DOA: 09/24/2023     8 DOS: the patient was seen and examined on 10/03/2023   Brief hospital course: " [place] [facility] is a 58 y.o. female with medical history significant of recurrent pancreatitis with pseudocyst formation, seizure disorder, chronic pain and narcotic dependence, GCA, asthma/COPD, chronic hypoxic respiratory failure on 2 L continuously, GCA off steroid since May 2024, sent from [facility] office for evaluation of recurrent pancreatitis. "  Pt was admitted for further evaluation and management.  Being treated with supportive care including pain control, antiemetics, and initially kept NPO now advanced to soft diet.  Further hospital course and management as outlined below.  In summary, hospital course prolonged by persistent abdominal pain and poor PO intake, in addition to multifactorial encephalopathy as outlined below.   2/12: Changed her pain regimen from Dilaudid to her home regimen in anticipation of discharge, neuro consult 2/13: Psychiatry consult for depression.  Family refused NG tube or artificial nutrition as patient pulled out the tube in the past   Assessment and Plan:  Acute on chronic pancreatitis With chronic pseudocyst formation.  According to [facility] GI note, etiology appears to be idiopathic. Patient was hospitalized two times previously since November 2024.  Appears to have been having increasing size of multiple pancreatic pseudocysts.  Unfortunately, patient has lost follow-up with [facility] for 1+ year.  Pt sent to ER from local GI clinic due to severity of symptoms, hypokalemia and hypotension.   [facility] GI records reviewed in Care Everywhere 2/4 CT abdomen pelvis with contrast showed changes of acute on chronic pancreatitis with multiple peripancreatic collections increased in size and number from prior study, peripancreatic edema, reactive mesenteric/omental edema. 2/6 -- changed  PO morphine to PO dilaudid, pt did not require any IV dilaudid 2/7 -- lower pain scores, IV dilaudid dc'd not used in over 24 hours, diet advanced 2/8 -- lipase up with diet advanced, added IV Toradol due to oversedation 2/9--10 -- ongoing pain, lipase down a 190 >> 148 >> 142 2/11 - Advance diet to soft / low fiber / low fat.  Improved PO intake this AM 2/12: Changed her pain regimen from Dilaudid to her home regimen in anticipation of discharge, pending neurology consult --NO IV NARCOTICS --Creon --Follow up with GI at [facility] as outpatient --Follow up with outpatient Pain Management.  Follow at the clinic in the room --RD consulted for dietary education.   -I have discussed with her mother and also encourage patient to take adequate oral nutrition.  She is not in favor of putting any kind of tubes/artificial nutrition for now as in the past patient has pulled out NG tube  Acute metabolic encephalopathy -- intermittent since admission, in setting of opioids for pain. ?Subclinical seizures -- pt has intermittently refused her antiepileptic meds since admission. Chart reviewed and note prior hospital admissions for same -- oversedation appearing from medications.  Pt's mother reported that, at home, pt sleeps, wakes up for pain medication and then sleeps more.  This appears to be a chronic long-term issue. Possibly exacerbated by Vimpat(?) - dc'd given pt refusing and was noted very sedated for a day after dose she did take. Outpatient neurologist was tapering her off this, but was supposed to continue for another month. --Ruled out hypercarbia, hyperammonemia  --Delirium precautions --CT head - negative 2/9 --EEG 2/10 - diffuse slowing, no evidence of seizure activity --Seizure precautions   Hypokalemia - Resolved.  Due to poor PO intake in setting of pancreatitis. --Monitor BMP, Mg & replace as needed  Hyponatremia - Resolved with IV hydration --Monitor BMP --Encourage PO  intake  Leukocytosis - suspect reactive, monitor off antibiotics Abnormal UA - contaminated sample with large # squamous epi cells Pt denies any UTI symptoms.   --No antibiotics indicated. --Monitor.   Seizure disorder - stable --continue Keppra Vimpat and phenytoin --D/C Vimpat -- pt has refused since admission due to over-sedation --Pt also refusing phenytoin per nursing --Seizure precautions --IV Ativan PRN seizure activity -- Appreciate neurology input - Continue keppra 1500mg  bid - Continue gabapentin 300mg  qid - Increase carbamazepine from 100mg  once daily to 100mg  bid (patient was on bid prior to admission)  Neurology note from recent visit 09/17/23 reviewed in detail.  Medication changes noted and orders were updated to reflect Dr. Margaretmary Eddy recommendations from that encounter.  Pt continued to refuse Vimpat, now dc'd.   Severe hypoalbuminemia Clinically suspect chronic pancreatic insufficiency given repeated flareups of chronic pancreatitis. -- Continue Creon   Cigarette smoking -Cessation education given -Nicotine patch   History of GCA - no acute issues -Followed by [facility] rheumatology, off steroid since May 2024      Subjective: Pt remains drowsy -will cut back on her pain meds/MS IR.  She still does not have much appetite and requesting 1 more night but not interested in artificial tube feeding   Physical Exam: Vitals:   10/02/23 1951 10/03/23 0334 10/03/23 0500 10/03/23 0715  BP: 123/62 (!) 117/54  130/81  Pulse: (!) 102 96  (!) 103  Resp: 17 17  20   Temp: 98.3 F (36.8 C) 98.3 F (36.8 C)  97.8 F (36.6 C)  TempSrc: Oral Oral  Axillary  SpO2:  93%  95%  Weight:   67.2 kg   Height:       General exam: awake, drowsy appearing, no acute distress HEENT: heavy eyelids, moist mucus membranes, hearing grossly normal Respiratory system: on room air, normal respiratory effort, CTAB no wheezes or rhonchi Cardiovascular system: RRR, no pedal edema.  Normal  S1/S2 Gastrointestinal system: soft, non-tender, non-distended, no guarding or rebound tenderness Central nervous system: O x 3. no gross focal neurologic deficits, normal speech Extremities: moves all, no edema, normal tone Psychiatry: normal mood, flat affect   Data Reviewed:  Notable labs --   Na 134 K normal Cl 95 BUN < 5 Cr 0.43 Ca 8.1   Lipase 80 >> 81 >> 74 >> 190 >> 148 >> 127     Family Communication: Pt's mother updated over phone on 2/12 and 2/13   Disposition: Status is: Inpatient Remains inpatient appropriate because: ongoing pancreatitis.  Slowly improving, expect ready for d/c in 1-2 days.    Planned Discharge Destination: Home with home health    Time spent: 35 minutes     Author: Delfino Lovett, MD 10/03/2023 3:54 PM  For on call review [email or web address].

## 2023-10-03 NOTE — Progress Notes (Signed)
Occupational Therapy Treatment Patient Details Name: Abigail [name] [facility] MRN: [id number] DOB: 1965/09/02 Today's Date: 10/03/2023   History of present illness Pt admitted for pancreatitis. History includes seizures, narcotic use, asthma, COPD with 2L of O2 PRN at baseline.   OT comments  Pt seen for OT tx. Pt lethargic and endorsing feeling groggy but just woke up from a nap and reports recently getting morphine. Required MIN A For bed mobility and only able to tolerate grooming task EOB before requesting to return to bed 2/2 nausea. RN notified. Will continue to progress as able.       If plan is discharge home, recommend the following:  A little help with walking and/or transfers;A little help with bathing/dressing/bathroom;Direct supervision/assist for medications management;Direct supervision/assist for financial management;Supervision due to cognitive status;Assistance with cooking/housework   Equipment Recommendations  None recommended by OT    Recommendations for Other Services      Precautions / Restrictions Precautions Precautions: Fall Recall of Precautions/Restrictions: Intact Restrictions Weight Bearing Restrictions Per Provider Order: No       Mobility Bed Mobility Overal bed mobility: Needs Assistance Bed Mobility: Sidelying to Sit, Rolling, Sit to Sidelying Rolling: Supervision, Used rails Sidelying to sit: Min assist, Used rails, HOB elevated     Sit to sidelying: Min assist      Transfers                   General transfer comment: deferred 2/2 nausea     Balance Overall balance assessment: Needs assistance Sitting-balance support: Feet supported, Bilateral upper extremity supported, Single extremity supported Sitting balance-Leahy Scale: Fair                                     ADL either performed or assessed with clinical judgement   ADL Overall ADL's : Needs assistance/impaired     Grooming:  Sitting;Supervision/safety;Wash/dry face                                      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     Cognition Arousal: Lethargic, Suspect due to medications Behavior During Therapy: Flat affect                                 Following commands: Intact        Cueing   Cueing Techniques: Verbal cues  Exercises      Shoulder Instructions       General Comments pt endorsing nausea with sitting EOB, slightly improved with laying back down    Pertinent Vitals/ Pain       Pain Assessment Pain Assessment: 0-10 Pain Score: 7  Pain Location: R>L stomach Pain Descriptors / Indicators: Aching, Discomfort, Other (Comment) (nausea) Pain Intervention(s): Limited activity within patient's tolerance, Monitored during session, Premedicated before session, Repositioned  Home Living                                          Prior Functioning/Environment  Frequency  Min 1X/week        Progress Toward Goals  OT Goals(current goals can now be found in the care plan section)  Progress towards OT goals: OT to reassess next treatment  Acute Rehab OT Goals Patient Stated Goal: go home OT Goal Formulation: With patient Time For Goal Achievement: 10/15/23 Potential to Achieve Goals: Good  Plan      Co-evaluation                 AM-PAC OT "6 Clicks" Daily Activity     Outcome Measure   Help from another person eating meals?: None Help from another person taking care of personal grooming?: A Little Help from another person toileting, which includes using toliet, bedpan, or urinal?: A Little Help from another person bathing (including washing, rinsing, drying)?: A Lot Help from another person to put on and taking off regular upper body clothing?: A Lot Help from another person to put on and taking off regular lower body  clothing?: A Lot 6 Click Score: 16    End of Session    OT Visit Diagnosis: Other abnormalities of gait and mobility (R26.89);Muscle weakness (generalized) (M62.81)   Activity Tolerance Patient limited by lethargy;Treatment limited secondary to medical complications (Comment) (nausea)   Patient Left in bed;with call bell/phone within reach;with bed alarm set;with family/visitor present   Nurse Communication Other (comment) (nausea)        Time: 1610-9604 OT Time Calculation (min): 14 min  Charges: OT General Charges $OT Visit: 1 Visit OT Treatments $Self Care/Home Management : 8-22 mins  Arman Filter., MPH, MS, OTR/L ascom [phone] 10/03/23, 3:46 PM

## 2023-10-03 NOTE — Plan of Care (Signed)
  Problem: Education: Goal: Knowledge of General Education information will improve Description: Including pain rating scale, medication(s)/side effects and non-pharmacologic comfort measures Outcome: Progressing   Problem: Clinical Measurements: Goal: Will remain free from infection Outcome: Progressing Goal: Diagnostic test results will improve Outcome: Progressing Goal: Respiratory complications will improve Outcome: Progressing Goal: Cardiovascular complication will be avoided Outcome: Progressing   Problem: Elimination: Goal: Will not experience complications related to urinary retention Outcome: Progressing   Problem: Pain Managment: Goal: General experience of comfort will improve and/or be controlled Outcome: Progressing

## 2023-10-04 ENCOUNTER — Other Ambulatory Visit: Payer: Self-pay

## 2023-10-04 DIAGNOSIS — F4323 Adjustment disorder with mixed anxiety and depressed mood: Secondary | ICD-10-CM

## 2023-10-04 DIAGNOSIS — K863 Pseudocyst of pancreas: Secondary | ICD-10-CM | POA: Diagnosis not present

## 2023-10-04 DIAGNOSIS — K859 Acute pancreatitis without necrosis or infection, unspecified: Secondary | ICD-10-CM | POA: Diagnosis not present

## 2023-10-04 DIAGNOSIS — E876 Hypokalemia: Secondary | ICD-10-CM | POA: Diagnosis not present

## 2023-10-04 DIAGNOSIS — K861 Other chronic pancreatitis: Secondary | ICD-10-CM | POA: Diagnosis not present

## 2023-10-04 MED ORDER — POLYETHYLENE GLYCOL 3350 17 GM/SCOOP PO POWD
17.0000 g | Freq: Every day | ORAL | 0 refills | Status: DC
  Filled 2023-10-04 (×2): qty 238, 14d supply, fill #0

## 2023-10-04 MED ORDER — CARBAMAZEPINE ER 100 MG PO TB12
100.0000 mg | ORAL_TABLET | Freq: Two times a day (BID) | ORAL | 0 refills | Status: DC
  Filled 2023-10-04: qty 60, 30d supply, fill #0

## 2023-10-04 MED ORDER — MIRTAZAPINE 7.5 MG PO TABS
7.5000 mg | ORAL_TABLET | Freq: Every day | ORAL | 0 refills | Status: DC
  Filled 2023-10-04: qty 30, 30d supply, fill #0

## 2023-10-04 MED ORDER — DULOXETINE HCL 60 MG PO CPEP
60.0000 mg | ORAL_CAPSULE | Freq: Every day | ORAL | 0 refills | Status: DC
  Filled 2023-10-04 (×2): qty 30, 30d supply, fill #0

## 2023-10-04 MED ORDER — BISACODYL 5 MG PO TBEC
5.0000 mg | DELAYED_RELEASE_TABLET | Freq: Every day | ORAL | 0 refills | Status: AC | PRN
  Filled 2023-10-04: qty 30, 30d supply, fill #0

## 2023-10-04 MED ORDER — GABAPENTIN 300 MG PO CAPS
300.0000 mg | ORAL_CAPSULE | Freq: Four times a day (QID) | ORAL | 0 refills | Status: AC
  Filled 2023-10-04: qty 120, 30d supply, fill #0

## 2023-10-04 MED ORDER — LEVETIRACETAM 750 MG PO TABS
1500.0000 mg | ORAL_TABLET | Freq: Two times a day (BID) | ORAL | 0 refills | Status: DC
  Filled 2023-10-04 (×2): qty 120, 30d supply, fill #0

## 2023-10-04 MED ORDER — SENNOSIDES-DOCUSATE SODIUM 8.6-50 MG PO TABS
1.0000 | ORAL_TABLET | Freq: Two times a day (BID) | ORAL | 0 refills | Status: DC
  Filled 2023-10-04 (×2): qty 60, 30d supply, fill #0

## 2023-10-04 NOTE — Progress Notes (Signed)
Mobility Specialist - Progress Note   10/04/23 1200  Mobility  Activity Ambulated with assistance in room  Level of Assistance Standby assist, set-up cues, supervision of patient - no hands on  Assistive Device Front wheel walker  Distance Ambulated (ft) 16 ft  Activity Response Tolerated well  Mobility visit 1 Mobility    During mobility: 105 HR, 84% SpO2 Post-mobility: 108 HR, 91% SpO2   Pt lying in bed upon arrival, utilizing 2L. Pt agreeable to activity. Voiced abdominal pain and nausea. Completed bed mobility with supervision + use of bed rails for log roll. STS and ambulation with minG. No LOB. Slow, gait. Pt declined further distance d/t fatigue. Pt reports RPE as 7/10. O2 desat to 84% requiring bump to 3L to get sats > 88%. Pt returned supine with alarm set, needs in reach. Back on 2L. RN notified.    Filiberto Pinks Mobility Specialist 10/04/23, 12:24 PM

## 2023-10-04 NOTE — Care Management Important Message (Signed)
Important Message  Patient Details  Name: Abigail [name] MRN: [id number] Date of Birth: 1966-03-07   Important Message Given:  Yes - Medicare IM     Sherilyn Banker 10/04/2023, 10:56 AM

## 2023-10-04 NOTE — Consult Note (Signed)
[facility] Psychiatric Consult Initial  Patient Name: .Abigail [name]  MRN: [id number]  DOB: [birth year]  Consult Order details:  Orders (From admission, onward)     Start     Ordered   10/03/23 1553  IP CONSULT TO PSYCHIATRY       Comments: Secure message sent to [employer]  Ordering Provider: Delfino Lovett, MD  Provider:  (Not yet assigned)  Question Answer Comment  Location [name]   Reason for Consult? Depression, minimal p.o. intake      10/03/23 1552             Mode of Visit: In person    Psychiatry Consult Evaluation  Service Date: October 04, 2023 LOS:  LOS: 9 days  Chief Complaint evaluate for depression  Primary Psychiatric Diagnoses  Adjustment disorder with emotional disturbance 2.   3.    Assessment  Abigail [name] is a 58 y.o. female admitted: Medicallyfor 09/24/2023  1:48 PM medical history significant of recurrent pancreatitis with pseudocyst formation, seizure disorder, chronic pain and narcotic dependence, GCA, asthma/COPD, chronic hypoxic respiratory failure on 2 L continuously, GCA off steroid since May 2024, sent from [facility] office for evaluation of recurrent pancreatitis. Pt was admitted for further evaluation and management. Being treated with supportive care including pain control, antiemetics, and initially kept NPO now advanced to soft diet. Psychiatry was consulted to evaluate for depression and loss of appetite.Patient endorses feeling anxious and depressed about her ongoing medical problems but remains future oriented and she wants to get better physically so that she can go on with her life. Mom was present through out the interview in the room.      Diagnoses:  Active Hospital problems: Principal Problem:   Pancreatitis Active Problems:   Hypokalemia   Idiopathic acute pancreatitis without infection or necrosis   Pancreatic pseudocyst   Chronic recurrent pancreatitis (HCC)   Hyponatremia    Malnutrition of moderate degree    Plan   ## Psychiatric Medication Recommendations:  Consider adding Remeron 7.5mg  at bedtime to help with insomnia/appetite and mood  ## Medical Decision Making Capacity: Not specifically addressed in this encounter  ## Further Work-up:   -- Pertinent labwork reviewed earlier this admission includes: elevated lipase   ## Disposition:-- There are no psychiatric contraindications to discharge at this time  ## Behavioral / Environmental: -Difficult Patient (SELECT OPTIONS FROM BELOW)    ## Safety and Observation Level:  - Based on my clinical evaluation, I estimate the patient to be at NO risk of self harm in the current setting. - At this time, we recommend  routine. This decision is based on my review of the chart including patient's history and current presentation, interview of the patient, mental status examination, and consideration of suicide risk including evaluating suicidal ideation, plan, intent, suicidal or self-harm behaviors, risk factors, and protective factors. This judgment is based on our ability to directly address suicide risk, implement suicide prevention strategies, and develop a safety plan while the patient is in the clinical setting. Please contact our team if there is a concern that risk level has changed.  CSSR Risk Category:C-SSRS RISK CATEGORY: No Risk  Suicide Risk Assessment: Patient has following modifiable risk factors for suicide: social isolation, which we are addressing by family attending to her more often. Patient has following non-modifiable or demographic risk factors for suicide: separation or divorce Patient has the following protective factors against suicide: Access to outpatient mental health care, Supportive  family, Supportive friends, Cultural, spiritual, or religious beliefs that discourage suicide, and no history of suicide attempts  Thank you for this consult request. Recommendations have been communicated  to the primary team.  We will sign off at this time.   Abigail Chol, MD       History of Present Illness  Abigail [name] is a 58 y.o. female admitted: Medicallyfor 09/24/2023  1:48 PM medical history significant of recurrent pancreatitis with pseudocyst formation, seizure disorder, chronic pain and narcotic dependence, GCA, asthma/COPD, chronic hypoxic respiratory failure on 2 L continuously, GCA off steroid since May 2024, sent from [facility] office for evaluation of recurrent pancreatitis. Pt was admitted for further evaluation and management. Being treated with supportive care including pain control, antiemetics, and initially kept NPO now advanced to soft diet. Psychiatry was consulted to evaluate for depression and loss of appetite.  Today on interview patient is noted to be resting in bed and her mom was present bedside.  She is awake, alert, oriented x 3.  She is able to talk about her medical problems that led up to the medical admission.  She reports that she is frustrated about the pancreatitis and possible gallbladder problems that are causing her to have stomach pain and unable to eat.  She reports that she has an appetite but given her nausea and severe pain in the abdomen whenever she tried to eat few days ago is making her to be scared of food.  She also reports that nausea is making her not able to eat food.  Her mom reports that patient has not been eating at all in the last few days.  Patient reports that she is anxious about her physical health and wants to get better.  She reports that she is anxious that nobody is able to figure out how to help her with her pancreatic disease.  She reports that she wants to get better and live with her family.  She reports situational depression and feeling hopeless about her medical problems.  She denies SI/HI/plan.  She denies auditory/visual hallucinations.  She denies having any panic attacks.  She denies nightmares or flashbacks.  She denies  any history of abuse.  Provider discussed about adding Remeron to help with appetite,'s sleep, mood.  Patient agreed with the plan.  No overt delusions were noted during the interview.  Collateral information:  Contacted from mom bedside.  ROS   Psychiatric and Social History  Psychiatric History:  Information collected from patient and mother  Prev Dx/Sx: Depression Current Psych Provider: None reported Home Meds (current): Cymbalta Previous Med Trials: None reported Therapy: None reported  Prior Psych Hospitalization: None reported Prior Self Harm: None reported Prior Violence: None reported  Family Psych History: None reported Family Hx suicide: None reported  Social History:  Developmental Hx: Normal Educational Hx: Associates degree Occupational Hx: On disability Legal Hx: None reported Living Situation: With family Spiritual Hx: None reported Access to weapons/lethal means: Has a safety lock on it and reports her son make sure that she has no access to it  Substance History Alcohol: None reported  Tobacco: Few cigarettes per day Illicit drugs: None reported Prescription drug abuse: None reported Rehab hx: None reported  Exam Findings  Physical Exam: Reviewed and agree with the medical examination and physical findings by the medical provider Vital Signs:  Temp:  [97.8 F (36.6 C)-98.9 F (37.2 C)] 97.8 F (36.6 C) (02/14 0803) Pulse Rate:  [91-109] 99 (02/14 0803) Resp:  [  16-20] 20 (02/14 0803) BP: (85-124)/(50-90) 114/66 (02/14 0803) SpO2:  [92 %-96 %] 96 % (02/14 0803) Weight:  [68.1 kg] 68.1 kg (02/14 0500) Blood pressure 114/66, pulse 99, temperature 97.8 F (36.6 C), temperature source Oral, resp. rate 20, height 5\' 7"  (1.702 m), weight 68.1 kg, SpO2 96%. Body mass index is 23.51 kg/m.    Mental Status Exam: General Appearance: Casual and Neat  Orientation:  Full (Time, Place, and Person)  Memory:  Immediate;   Fair Recent;   Fair Remote;    Fair  Concentration:  Concentration: Fair and Attention Span: Fair  Recall:  Fair  Attention  Fair  Eye Contact:  Fair  Speech:  Clear and Coherent  Language:  Fair  Volume:  Normal  Mood: anxious  Affect:  Constricted  Thought Process:  Linear  Thought Content:  Logical  Suicidal Thoughts:  No  Homicidal Thoughts:  No  Judgement:  Fair  Insight:  Fair  Psychomotor Activity:  Decreased  Akathisia:  No  Fund of Knowledge:  Fair      Assets:  Engineer, maintenance Social Support  Cognition:  WNL  ADL's:  Intact  AIMS (if indicated):        Other History   These have been pulled in through the EMR, reviewed, and updated if appropriate.  Family History:  The patient's family history is not on file.  Medical History: Past Medical History:  Diagnosis Date   Anxiety    Arthritis    Back pain    Basal cell carcinoma 10/04/2021   R axilla - needs excised 11/28/21   Basal cell carcinoma 10/04/2021   L antecubital excised 11/14/21   Diarrhea 11/12/2016   Fibromyalgia    Generalized abdominal pain 11/12/2016   H. pylori infection    Hyperlipidemia    IBS (irritable bowel syndrome)    Infectious colitis 04/29/2016   Migraines    Moderate dehydration 04/29/2016   Muscle pain    Opioid overdose (HCC) 12/07/2022   Reflux    Unexplained weight loss 11/12/2016    Surgical History: Past Surgical History:  Procedure Laterality Date   ABDOMINAL HYSTERECTOMY     APPENDECTOMY  2009   C5 FUSION     C6 FUSION     C7 FUSION     COLONOSCOPY  02/2006   COLONOSCOPY WITH PROPOFOL N/A 12/25/2016   Procedure: COLONOSCOPY WITH PROPOFOL;  Surgeon: Earline Mayotte, MD;  Location: [facility] END[facility]COPY;  Service: Endoscopy;  Laterality: N/A;   ESOPHAGOGASTRODUODENOSCOPY (EGD) WITH PROPOFOL N/A 12/25/2016   Procedure: ESOPHAGOGASTRODUODENOSCOPY (EGD) WITH PROPOFOL;  Surgeon: Earline Mayotte, MD;  Location: [facility] ENDOSCOPY;  Service: Endoscopy;  Laterality: N/A;   FOOT SURGERY      HIP ARTHROPLASTY     L4 FUSION     L5 FUSION     S1 FUSION       Medications:   Current Facility-Administered Medications:    bisacodyl (DULCOLAX) EC tablet 5 mg, 5 mg, Oral, Daily PRN, Esaw Grandchild A, DO   capsaicin (ZOSTRIX) 0.025 % cream 1 Application, 1 Application, Topical, BID, Pennie Banter, DO, 1 Application at 10/04/23 1610   carbamazepine (TEGRETOL XR) 12 hr tablet 100 mg, 100 mg, Oral, BID, Bing Neighbors M, MD, 100 mg at 10/04/23 0830   DULoxetine (CYMBALTA) DR capsule 60 mg, 60 mg, Oral, Daily, Mikey College T, MD, 60 mg at 10/04/23 0832   enoxaparin (LOVENOX) injection 40 mg, 40 mg, Subcutaneous, Q24H, Emeline General, MD, 40  mg at 10/03/23 1711   folic acid (FOLVITE) tablet 1 mg, 1 mg, Oral, Daily, Mikey College T, MD, 1 mg at 10/04/23 0981   gabapentin (NEURONTIN) capsule 300 mg, 300 mg, Oral, QID, Mikey College T, MD, 300 mg at 10/04/23 0829   levETIRAcetam (KEPPRA) tablet 1,500 mg, 1,500 mg, Oral, BID, Mikey College T, MD, 1,500 mg at 10/04/23 0832   lidocaine (LIDODERM) 5 % 1 patch, 1 patch, Transdermal, Q24H, Mikey College T, MD, 1 patch at 10/03/23 1711   lipase/protease/amylase (CREON) capsule 24,000 Units, 24,000 Units, Oral, TID Adonis Brook T, MD, 24,000 Units at 10/04/23 1204   melatonin tablet 10 mg, 10 mg, Oral, QHS, Zhang, Ping T, MD, 10 mg at 10/03/23 2120   modafinil (PROVIGIL) tablet 200 mg, 200 mg, Oral, Daily, Barrie Folk, RPH, 200 mg at 10/04/23 1914   morphine (MSIR) tablet 15 mg, 15 mg, Oral, TID BM PRN, Delfino Lovett, MD, 15 mg at 10/04/23 0843   multivitamin with minerals tablet 1 tablet, 1 tablet, Oral, Daily, Delfino Lovett, MD, 1 tablet at 10/04/23 0830   nicotine (NICODERM CQ - dosed in mg/24 hours) patch 21 mg, 21 mg, Transdermal, Daily, Mikey College T, MD, 21 mg at 10/04/23 0829   ondansetron (ZOFRAN) injection 4 mg, 4 mg, Intravenous, Q6H PRN, Mikey College T, MD, 4 mg at 10/02/23 1115   pantoprazole (PROTONIX) EC tablet 40 mg, 40 mg, Oral, BID  AC, Mikey College T, MD, 40 mg at 10/04/23 7829   polyethylene glycol (MIRALAX / GLYCOLAX) packet 17 g, 17 g, Oral, Daily, Esaw Grandchild A, DO, 17 g at 10/04/23 5621   protein supplement (ENSURE MAX) liquid, 11 oz, Oral, BID, Delfino Lovett, MD, 11 oz at 10/04/23 3086   senna-docusate (Senokot-S) tablet 1 tablet, 1 tablet, Oral, BID, Pennie Banter, DO, 1 tablet at 10/04/23 5784   simethicone (MYLICON) chewable tablet 80 mg, 80 mg, Oral, QID PRN, Pennie Banter, DO, 80 mg at 09/28/23 1458  Allergies: Allergies  Allergen Reactions   Nsaids Hives   Tapentadol Swelling, Rash and Other (See Comments)    Nucynta- Made her deathly sick   Cephalexin Other (See Comments)    Reaction not cited   Codeine Other (See Comments)    Reaction not cited   Darvocet [Propoxyphene N-Acetaminophen] Other (See Comments)    Reaction not cited   Latex Other (See Comments)    Reaction not cited   Silicone Other (See Comments)    Reaction not cited   Sulfa Antibiotics Hives   Tape Other (See Comments)    Reaction not cited   Meloxicam Rash    Abigail Chol, MD

## 2023-10-04 NOTE — Progress Notes (Signed)
Discharge instructions were reviewed with patient and mother. IV s were removed. Belongings collected by family.

## 2023-10-04 NOTE — TOC Transition Note (Signed)
Transition of Care [facility]) - Discharge Note   Patient Details  Name: Jacquelin Krajewski [facility] MRN: [id number] Date of Birth: 1965/10/14  [facility] CM/SW Contact:  Chapman Fitch, RN Phone Number: 10/04/2023, 3:22 PM   Clinical Narrative:    Patient to dc today Mother at bedside for transport Portable O2 tank delivered by [employer] for transport home Jessica with [facility] notified of discharge         Patient Goals and CMS Choice            Discharge Placement                       Discharge Plan and Services Additional resources added to the After Visit Summary for                                       Social Drivers of Health (SDOH) Interventions SDOH Screenings   Food Insecurity: Patient Unable To Answer (09/25/2023)  Recent Concern: Food Insecurity - Food Insecurity Present (07/02/2023)  Housing: Patient Unable To Answer (09/25/2023)  Transportation Needs: Patient Unable To Answer (09/25/2023)  Utilities: Patient Unable To Answer (09/25/2023)  Depression (PHQ2-9): High Risk (09/18/2023)  Tobacco Use: High Risk (09/24/2023)     Readmission Risk Interventions    09/26/2023    4:06 PM 07/31/2023    1:07 PM  Readmission Risk Prevention Plan  Transportation Screening Complete Complete  Medication Review (RN Care Manager) Complete Complete  PCP or Specialist appointment within 3-5 days of discharge  Complete  HRI or Home Care Consult  Complete  SW Recovery Care/Counseling Consult Complete Complete  Palliative Care Screening Not Applicable Not Applicable  Skilled Nursing Facility Not Applicable Not Applicable

## 2023-10-06 NOTE — Discharge Summary (Signed)
Physician Discharge Summary   Patient: Abigail [name] [facility] MRN: [id number] DOB: 02/09/1966  Admit date:     09/24/2023  Discharge date: 10/04/2023  Discharge Physician: Delfino Lovett   PCP: Alease Medina, MD   Recommendations at discharge:   Follow-up with outpatient providers as requested  Discharge Diagnoses: Principal Problem:   Pancreatitis Active Problems:   Pancreatic pseudocyst   Hypokalemia   Idiopathic acute pancreatitis without infection or necrosis   Chronic recurrent pancreatitis (HCC)   Hyponatremia   Malnutrition of moderate degree  Hospital Course: Assessment and Plan:  Abigail [name] is a 58 y.o. female with medical history significant of recurrent pancreatitis with pseudocyst formation, seizure disorder, chronic pain and narcotic dependence, GCA, asthma/COPD, chronic hypoxic respiratory failure on 2 L continuously, GCA off steroid since May 2024, sent from [facility] office for evaluation of recurrent pancreatitis. "   Pt was admitted for further evaluation and management.  Being treated with supportive care including pain control, antiemetics, and initially kept NPO now advanced to soft diet.   Further hospital course and management as outlined below.   In summary, hospital course prolonged by persistent abdominal pain and poor PO intake, in addition to multifactorial encephalopathy as outlined below.    2/12: Changed her pain regimen from Dilaudid to her home regimen in anticipation of discharge, neuro consult 2/13: Psychiatry consult for depression.  Family refused NG tube or artificial nutrition as patient pulled out the tube in the past 2/14: Discharge home  Assessment and Plan:   Acute on chronic pancreatitis With chronic pseudocyst formation.  According to [facility] GI note, etiology appears to be idiopathic. Patient was hospitalized two times previously since November 2024.  Appears to have been having increasing size of multiple pancreatic  pseudocysts.  Unfortunately, patient has lost follow-up with [facility] GI for 1+ year.  Pt sent to ER from local GI clinic due to severity of symptoms, hypokalemia and hypotension.   [facility] GI records reviewed in Care Everywhere 2/4 CT abdomen pelvis with contrast showed changes of acute on chronic pancreatitis with multiple peripancreatic collections increased in size and number from prior study, peripancreatic edema, reactive mesenteric/omental edema. 2/6 -- changed PO morphine to PO dilaudid, pt did not require any IV dilaudid 2/7 -- lower pain scores, IV dilaudid dc'd not used in over 24 hours, diet advanced 2/8 -- lipase up with diet advanced, added IV Toradol due to oversedation 2/9--10 -- ongoing pain, lipase down a 190 >> 148 >> 142 2/11 - Advance diet to soft / low fiber / low fat.  Improved PO intake this AM 2/12: Changed her pain regimen from Dilaudid to her home regimen in anticipation of discharge, pending neurology consult --NO IV NARCOTICS --Creon --Follow up with GI at [facility] as outpatient --Follow up with outpatient Pain Management.   --RD consulted for dietary education.   -I have discussed with her mother and also encourage patient to take adequate oral nutrition.  She is not in favor of putting any kind of tubes/artificial nutrition for now as in the past patient has pulled out NG tube   Acute metabolic encephalopathy -- intermittent since admission, in setting of opioids for pain. ?Subclinical seizures -- pt has intermittently refused her antiepileptic meds since admission. Chart reviewed and note prior hospital admissions for same -- oversedation appearing from medications.  Pt's mother reported that, at home, pt sleeps, wakes up for pain medication and then sleeps more.  This appears to be a chronic long-term issue.  Possibly exacerbated by Vimpat(?) - dc'd given pt refusing and was noted very sedated for a day after dose she did take. Outpatient neurologist was tapering her off this,  but was supposed to continue for another month. --Ruled out hypercarbia, hyperammonemia  --CT head - negative 2/9 --EEG 2/10 - diffuse slowing, no evidence of seizure activity --Seizure precautions   Hypokalemia - Resolved.  Due to poor PO intake in setting of pancreatitis.   Hyponatremia - Resolved with IV hydration --Encourage PO intake   Leukocytosis - suspect reactive, monitor off antibiotics Abnormal UA - contaminated sample with large # squamous epi cells Pt denies any UTI symptoms.   --No antibiotics indicated.   Seizure disorder - stable --continue Keppra Vimpat and phenytoin --D/C Vimpat -- pt has refused since admission due to over-sedation --Pt also refusing phenytoin per nursing --Seizure precautions --IV Ativan PRN seizure activity -- Appreciate neurology input - Continue keppra 1500mg  bid - Continue gabapentin 300mg  qid - Increase carbamazepine from 100mg  once daily to 100mg  bid (patient was on bid prior to admission) - Close outpatient f/u with Dr. Sherryll Burger at [facility] clinic   Neurology note from recent visit 09/17/23 reviewed in detail.  Medication changes noted and orders were updated to reflect Dr. Margaretmary Eddy recommendations from that encounter.  Pt continued to refuse Vimpat, now dc'd.   Severe hypoalbuminemia Clinically suspect chronic pancreatic insufficiency given repeated flareups of chronic pancreatitis. -- Continue Creon   Cigarette smoking -Cessation education given -Nicotine patch   History of GCA - no acute issues -Followed by [facility] rheumatology, off steroid since May 2024   Insomnia/poor appetite Seen by psychiatry and started Remeron 7.5 mg p.o. nightly       Consultants: Neurology, psychiatry  Disposition: Home health Diet recommendation:  Discharge Diet Orders (From admission, onward)     Start     Ordered   10/04/23 0000  Diet - low sodium heart healthy        10/04/23 1331           Carb modified diet DISCHARGE  MEDICATION: Allergies as of 10/04/2023       Reactions   Nsaids Hives   Tapentadol Swelling, Rash, Other (See Comments)   Nucynta- Made her deathly sick   Cephalexin Other (See Comments)   Reaction not cited   Codeine Other (See Comments)   Reaction not cited   Darvocet [propoxyphene N-acetaminophen] Other (See Comments)   Reaction not cited   Latex Other (See Comments)   Reaction not cited   Silicone Other (See Comments)   Reaction not cited   Sulfa Antibiotics Hives   Tape Other (See Comments)   Reaction not cited   Meloxicam Rash        Medication List     STOP taking these medications    albuterol (2.5 MG/3ML) 0.083% nebulizer solution Commonly known as: PROVENTIL   ALPRAZolam 0.5 MG tablet Commonly known as: XANAX   folic acid 1 MG tablet Commonly known as: FOLVITE   Magnesium 250 MG Tabs   multivitamin with minerals Tabs tablet   pantoprazole 40 MG tablet Commonly known as: PROTONIX   potassium chloride 10 MEQ tablet Commonly known as: KLOR-CON M   thiamine 100 MG tablet Commonly known as: VITAMIN B1   Valtoco 15 MG Dose 7.5 MG/0.1ML Lqpk Generic drug: diazePAM (15 MG Dose)   vitamin B-12 500 MCG tablet Commonly known as: CYANOCOBALAMIN   Vitamin D3 50 MCG (2000 UT) Tabs       TAKE  these medications    bisacodyl 5 MG EC tablet Generic drug: bisacodyl Take 1 tablet (5 mg total) by mouth daily as needed for moderate constipation.   carbamazepine 100 MG 12 hr tablet Commonly known as: TEGRETOL XR Take 1 tablet (100 mg total) by mouth 2 (two) times daily. What changed:  how much to take when to take this   DULoxetine 60 MG capsule Commonly known as: CYMBALTA Take 1 capsule (60 mg total) by mouth daily.   gabapentin 300 MG capsule Commonly known as: NEURONTIN Take 1 capsule (300 mg total) by mouth 4 (four) times daily. What changed: additional instructions   levETIRAcetam 750 MG tablet Commonly known as: KEPPRA Take 2 tablets  (1,500 mg total) by mouth 2 (two) times daily.   Melatonin 10 MG Tabs Take 10 mg by mouth at bedtime.   mirtazapine 7.5 MG tablet Commonly known as: REMERON Take 1 tablet (7.5 mg total) by mouth at bedtime.   modafinil 200 MG tablet Commonly known as: PROVIGIL Take 200 mg by mouth daily.   morphine 15 MG tablet Commonly known as: MSIR Take by mouth.   nicotine 14 mg/24hr patch Commonly known as: NICODERM CQ - dosed in mg/24 hours One patch chest wall daily (okay to substitute generic)   NON FORMULARY Apply 1 Application topically 3 (three) times daily as needed. Rock Salt Cream (Similar to [employer])   ondansetron 8 MG disintegrating tablet Commonly known as: ZOFRAN-ODT Take 1 tablet (8 mg total) by mouth every 8 (eight) hours as needed for nausea.   polyethylene glycol powder 17 GM/SCOOP powder Commonly known as: GLYCOLAX/MIRALAX Mix 17 g with a beverage and drink once daily.   Stimulant Laxative 8.6-50 MG tablet Generic drug: senna-docusate Take 1 tablet by mouth 2 (two) times daily.        Follow-up Information     [name], [name], MD. Go on 10/16/2023.   Specialty: Family Medicine Why: Need hospital follow up within 1-2 weeks of d/c. You already have a appointment 10/16/23 @ 3:50pm. This can be your hosiptal follow up. Contact information: [address] [place] [address] [phone]         Lonell Face, MD. Schedule an appointment as soon as possible for a visit in 2 week(s).   Specialty: Neurology Why: Schedule close follow up regarding seizure medications. The office is close today please call to make your follow up appointment. Contact information: [address] [facility] [place] [address] [phone]         Celso Amy, PA-C. Schedule an appointment as soon as possible for a visit in 1 week(s).   Specialty: Physician Assistant Why: [facility] Discharge F/UP. The office is closed today, please call  to schedule your follow up appointment. Contact information: [address] [place] [address] [phone]                Discharge Exam: Ceasar Mons Weights   09/24/23 1111 10/03/23 0500 10/04/23 0500  Weight: 66 kg 67.2 kg 68.1 kg   General exam: awake, drowsy appearing, no acute distress HEENT: heavy eyelids, moist mucus membranes, hearing grossly normal Respiratory system: on room air, normal respiratory effort, CTAB no wheezes or rhonchi Cardiovascular system: RRR, no pedal edema.  Normal S1/S2 Gastrointestinal system: soft, non-tender, non-distended, no guarding or rebound tenderness Central nervous system: O x 3. no gross focal neurologic deficits, normal speech Extremities: moves all, no edema, normal tone Psychiatry: normal mood, flat affect  Condition at discharge: fair  The results of significant diagnostics from this hospitalization (including imaging, microbiology, ancillary and laboratory) are listed below for reference.   Imaging Studies: EEG adult Result Date: 09/30/2023 Jefferson Fuel, MD     09/30/2023  9:03 PM Routine EEG Report Rachal [facility] [place] is a 58 y.o. female with a history of seizure who is undergoing an EEG to evaluate for seizures.  Report: This EEG was acquired with electrodes placed according to the International 10-20 electrode system (including Fp1, Fp2, F3, F4, C3, C4, P3, P4, O1, O2, T3, T4, T5, T6, A1, A2, Fz, Cz, Pz). The following electrodes were missing or displaced: none.  The best background was continuous at approximately 5-6 Hz with overriding beta frequencies. This activity is reactive to stimulation. Drowsiness was manifested by background fragmentation; deeper stages of sleep were identified by K complexes and sleep spindles. There was no focal slowing. There were generalized periodic discharges with triphasic morphology that waxed and waned, at time in runs of up to 1-1.5 Hz lasting 2-10 seconds. These did not  appear epileptiform. There were no definitive interictal epileptiform discharges. There were no electrographic seizures identified. Photic stimulation and hyperventilation were not performed.  Impression and clinical correlation: This EEG was obtained while awake and asleep and is abnormal due to: - Moderate diffuse slowing indicative of global cerebral dysfunction - Triphasic waves most commonly seen in the setting of metabolic derangement. - There were no definitive epileptiform abnormalities were not seen during this recording. This EEG is unchanged from her most recent EEG 07/12/23.  Bing Neighbors, MD [facility] [phone]   CT HEAD WO CONTRAST ( ) Result Date: 09/29/2023 CLINICAL DATA:  Mental status change, unknown cause. EXAM: CT HEAD WITHOUT CONTRAST TECHNIQUE: Contiguous axial images were obtained from the base of the skull through the vertex without intravenous contrast. RADIATION DOSE REDUCTION: This exam was performed according to the departmental dose-optimization program which includes automated exposure control, adjustment of the mA and/or kV according to patient size and/or use of iterative reconstruction technique. COMPARISON:  CT head without contrast 07/12/2023. FINDINGS: Brain: No acute infarct, hemorrhage, or mass lesion is present. No significant white matter lesions are present. Deep brain nuclei are within normal limits. The ventricles are of normal size. No significant extraaxial fluid collection is present. The brainstem and cerebellum are within normal limits. Midline structures are within normal limits. Vascular: No hyperdense vessel or unexpected calcification. Skull: Calvarium is intact. No focal lytic or blastic lesions are present. No significant extracranial soft tissue lesion is present. Sinuses/Orbits: Chronic opacification and atelectasis of the left maxillary sinus is stable. The paranasal sinuses and mastoid air cells are otherwise clear. The globes and orbits  are within normal limits. IMPRESSION: 1. Normal CT appearance of the brain. No acute or focal lesion to explain the patient's symptoms. 2. Chronic opacification and atelectasis of the left maxillary sinus. Electronically Signed   By: Marin Roberts M.D.   On: 09/29/2023 18:07   CT ABDOMEN PELVIS W CONTRAST Result Date: 09/24/2023 CLINICAL DATA:  Pancreatitis suspected. History of recurrent pancreatitis, seizure disorder, chronic pain, and narcotic dependence. EXAM: CT ABDOMEN AND PELVIS WITH CONTRAST TECHNIQUE: Multidetector CT imaging of the abdomen and pelvis was performed using the standard protocol following bolus administration of intravenous contrast. RADIATION DOSE REDUCTION: This exam was performed according to the departmental dose-optimization program which includes automated exposure control, adjustment of the mA and/or kV according to patient size and/or use of iterative reconstruction technique. CONTRAST:  OMNIPAQUE IOHEXOL 300 MG/ML  SOLN COMPARISON:  07/29/2023 FINDINGS: Lower chest: Linear areas of atelectasis or consolidation demonstrated in both lung bases. Hepatobiliary: Prominent diffuse fatty infiltration of the liver. Small subcapsular fluid collections along the left lobe and anterior aspect of the liver. Gallbladder and bile ducts are normal. Pancreas: Pancreatic parenchyma appears atrophic. Large loculated and moderately thick-walled cystic structure in the region of the head of the pancreas measuring 7.6 cm diameter. 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 2.8 cm diameter cyst in the body of the pancreas is also unchanged, likely also pseudocyst. 7.8 cm diameter cyst adjacent to the tail of the pancreas in the gastrosplenic ligament region. This collection is enlarging since prior study, also likely a pseudocyst. Since the prior study, there is increasing peripancreatic edema with upper abdominal fluid tracking around the liver. New  collection in the gastrohepatic ligament measuring 3.6 x 7.6 cm. Changes are consistent with acute on chronic pancreatitis. Spleen: Subcapsular collection in the subdiaphragmatic spleen, likely pseudocyst. Mildly enlarged. Adrenals/Urinary Tract: Adrenal glands are unremarkable. Kidneys are normal, without renal calculi, focal lesion, or hydronephrosis. Bladder is unremarkable. Stomach/Bowel: Stomach, small bowel, and colon are not abnormally distended. Under distention of colon limits evaluation but there appears to be some wall thickening of the colon which may indicate colitis. Appendix is surgically absent. Vascular/Lymphatic: Aortic atherosclerosis. No enlarged abdominal or pelvic lymph nodes. Reproductive: No pelvic mass. Other: Small amount of free fluid in the abdomen and pelvis, likely reactive. Infiltration in the omentum and mesentery, likely edema. No free air. Atrophic abdominal wall musculature appears intact. Musculoskeletal: Left hip arthroplasty. Postoperative fixation of the lower lumbar spine. Lumbar scoliosis convex towards the right. Compression deformities of multiple thoracic and lumbar vertebrae, unchanged. IMPRESSION: 1. Changes of acute on chronic pancreatitis as described above. Multiple peripancreatic collections, increasing in size and number since prior study. Peripancreatic edema. 2. Mesenteric/omental edema and free fluid in the abdomen/pelvis are likely reactive. 3. Prominent diffuse fatty infiltration of the liver. 4. Linear atelectasis in the lung bases. 5. Aortic atherosclerosis. 6. Decompression limits evaluation but there may be wall thickening in the colon suggesting colitis. Electronically Signed   By: Burman Nieves M.D.   On: 09/24/2023 18:01    Microbiology: Results for orders placed or performed during the hospital encounter of 07/29/23  Culture, blood (x 2)     Status: None   Collection Time: 07/29/23 11:18 AM   Specimen: BLOOD  Result Value Ref Range Status    Specimen Description BLOOD BLOOD RIGHT ARM  Final   Special Requests   Final    BOTTLES DRAWN AEROBIC AND ANAEROBIC Blood Culture adequate volume   Culture   Final    NO GROWTH 5 DAYS Performed at [facility], [address], [place], [place] [address]    Report Status 08/03/2023 FINAL  Final  Culture, blood (x 2)     Status: None   Collection Time: 07/29/23 11:18 AM   Specimen: BLOOD  Result Value Ref Range Status   Specimen Description BLOOD BLOOD RIGHT HAND  Final   Special Requests   Final    BOTTLES DRAWN AEROBIC AND ANAEROBIC Blood Culture adequate volume   Culture   Final    NO GROWTH 5 DAYS Performed at [facility], [address], [place], [place] [address]    Report Status 08/03/2023 FINAL  Final    Labs: CBC: Recent Labs  Lab 10/01/23 0643  WBC 7.0  HGB 11.3*  HCT 35.1*  MCV 92.1  PLT 291   Basic Metabolic Panel: Recent Labs  Lab 09/30/23 0951 10/01/23 0643 10/02/23 0425  NA 135 134* 134*  K 3.7 3.6 3.8  CL 97* 95* 97*  CO2 29 28 28   GLUCOSE 81 93 113*  BUN <5* <5* <5*  CREATININE 0.48 0.43* 0.36*  CALCIUM 7.9* 8.1* 7.7*  MG 1.8  --  1.8  PHOS 3.1  --   --    Liver Function Tests: No results for input(s): "AST", "ALT", "ALKPHOS", "BILITOT", "PROT", "ALBUMIN" in the last 168 hours. CBG: No results for input(s): "GLUCAP" in the last 168 hours.  Discharge time spent: greater than 30 minutes.  Signed: Delfino Lovett, MD [facility] 10/06/2023

## 2023-10-08 ENCOUNTER — Emergency Department: Payer: Medicare HMO

## 2023-10-08 ENCOUNTER — Inpatient Hospital Stay
Admission: EM | Admit: 2023-10-08 | Discharge: 2023-10-16 | DRG: 101 | Disposition: A | Discharge status: Home Health | Payer: Medicare HMO | Attending: Internal Medicine | Admitting: Internal Medicine

## 2023-10-08 ENCOUNTER — Other Ambulatory Visit: Payer: Self-pay

## 2023-10-08 ENCOUNTER — Inpatient Hospital Stay: Payer: Medicare HMO

## 2023-10-08 ENCOUNTER — Ambulatory Visit: Payer: Medicare HMO

## 2023-10-08 DIAGNOSIS — M069 Rheumatoid arthritis, unspecified: Secondary | ICD-10-CM | POA: Diagnosis present

## 2023-10-08 DIAGNOSIS — F17200 Nicotine dependence, unspecified, uncomplicated: Secondary | ICD-10-CM | POA: Diagnosis present

## 2023-10-08 DIAGNOSIS — Z96649 Presence of unspecified artificial hip joint: Secondary | ICD-10-CM | POA: Diagnosis present

## 2023-10-08 DIAGNOSIS — K589 Irritable bowel syndrome without diarrhea: Secondary | ICD-10-CM | POA: Diagnosis present

## 2023-10-08 DIAGNOSIS — F419 Anxiety disorder, unspecified: Secondary | ICD-10-CM | POA: Diagnosis present

## 2023-10-08 DIAGNOSIS — F32A Depression, unspecified: Secondary | ICD-10-CM | POA: Diagnosis present

## 2023-10-08 DIAGNOSIS — Z833 Family history of diabetes mellitus: Secondary | ICD-10-CM | POA: Diagnosis not present

## 2023-10-08 DIAGNOSIS — J9811 Atelectasis: Secondary | ICD-10-CM | POA: Diagnosis present

## 2023-10-08 DIAGNOSIS — M316 Other giant cell arteritis: Secondary | ICD-10-CM | POA: Diagnosis present

## 2023-10-08 DIAGNOSIS — J690 Pneumonitis due to inhalation of food and vomit: Secondary | ICD-10-CM

## 2023-10-08 DIAGNOSIS — Z8249 Family history of ischemic heart disease and other diseases of the circulatory system: Secondary | ICD-10-CM | POA: Diagnosis not present

## 2023-10-08 DIAGNOSIS — M797 Fibromyalgia: Secondary | ICD-10-CM | POA: Diagnosis present

## 2023-10-08 DIAGNOSIS — Z85828 Personal history of other malignant neoplasm of skin: Secondary | ICD-10-CM

## 2023-10-08 DIAGNOSIS — K219 Gastro-esophageal reflux disease without esophagitis: Secondary | ICD-10-CM | POA: Diagnosis present

## 2023-10-08 DIAGNOSIS — E871 Hypo-osmolality and hyponatremia: Secondary | ICD-10-CM | POA: Diagnosis not present

## 2023-10-08 DIAGNOSIS — R6510 Systemic inflammatory response syndrome (SIRS) of non-infectious origin without acute organ dysfunction: Secondary | ICD-10-CM | POA: Diagnosis present

## 2023-10-08 DIAGNOSIS — Z604 Social exclusion and rejection: Secondary | ICD-10-CM | POA: Diagnosis present

## 2023-10-08 DIAGNOSIS — R569 Unspecified convulsions: Secondary | ICD-10-CM | POA: Diagnosis not present

## 2023-10-08 DIAGNOSIS — R9431 Abnormal electrocardiogram [ECG] [EKG]: Secondary | ICD-10-CM | POA: Diagnosis not present

## 2023-10-08 DIAGNOSIS — Z9981 Dependence on supplemental oxygen: Secondary | ICD-10-CM | POA: Diagnosis not present

## 2023-10-08 DIAGNOSIS — E785 Hyperlipidemia, unspecified: Secondary | ICD-10-CM | POA: Diagnosis present

## 2023-10-08 DIAGNOSIS — Z1152 Encounter for screening for COVID-19: Secondary | ICD-10-CM | POA: Diagnosis not present

## 2023-10-08 DIAGNOSIS — Z888 Allergy status to other drugs, medicaments and biological substances status: Secondary | ICD-10-CM

## 2023-10-08 DIAGNOSIS — F17210 Nicotine dependence, cigarettes, uncomplicated: Secondary | ICD-10-CM | POA: Diagnosis present

## 2023-10-08 DIAGNOSIS — Z87898 Personal history of other specified conditions: Secondary | ICD-10-CM

## 2023-10-08 DIAGNOSIS — Z881 Allergy status to other antibiotic agents status: Secondary | ICD-10-CM

## 2023-10-08 DIAGNOSIS — R079 Chest pain, unspecified: Secondary | ICD-10-CM | POA: Diagnosis not present

## 2023-10-08 DIAGNOSIS — Z66 Do not resuscitate: Secondary | ICD-10-CM | POA: Diagnosis present

## 2023-10-08 DIAGNOSIS — J9611 Chronic respiratory failure with hypoxia: Secondary | ICD-10-CM | POA: Diagnosis present

## 2023-10-08 DIAGNOSIS — G8929 Other chronic pain: Secondary | ICD-10-CM | POA: Diagnosis present

## 2023-10-08 DIAGNOSIS — R45851 Suicidal ideations: Secondary | ICD-10-CM | POA: Diagnosis present

## 2023-10-08 DIAGNOSIS — Z79899 Other long term (current) drug therapy: Secondary | ICD-10-CM

## 2023-10-08 DIAGNOSIS — G40901 Epilepsy, unspecified, not intractable, with status epilepticus: Secondary | ICD-10-CM | POA: Diagnosis present

## 2023-10-08 DIAGNOSIS — J449 Chronic obstructive pulmonary disease, unspecified: Secondary | ICD-10-CM | POA: Diagnosis present

## 2023-10-08 DIAGNOSIS — F339 Major depressive disorder, recurrent, unspecified: Secondary | ICD-10-CM | POA: Diagnosis not present

## 2023-10-08 DIAGNOSIS — Z751 Person awaiting admission to adequate facility elsewhere: Secondary | ICD-10-CM

## 2023-10-08 DIAGNOSIS — E876 Hypokalemia: Secondary | ICD-10-CM | POA: Diagnosis not present

## 2023-10-08 DIAGNOSIS — Z882 Allergy status to sulfonamides status: Secondary | ICD-10-CM

## 2023-10-08 DIAGNOSIS — Z886 Allergy status to analgesic agent status: Secondary | ICD-10-CM

## 2023-10-08 DIAGNOSIS — Z91048 Other nonmedicinal substance allergy status: Secondary | ICD-10-CM

## 2023-10-08 DIAGNOSIS — Z91148 Patient's other noncompliance with medication regimen for other reason: Secondary | ICD-10-CM

## 2023-10-08 DIAGNOSIS — Z981 Arthrodesis status: Secondary | ICD-10-CM

## 2023-10-08 DIAGNOSIS — E43 Unspecified severe protein-calorie malnutrition: Secondary | ICD-10-CM | POA: Diagnosis not present

## 2023-10-08 DIAGNOSIS — Z91040 Latex allergy status: Secondary | ICD-10-CM

## 2023-10-08 DIAGNOSIS — F418 Other specified anxiety disorders: Secondary | ICD-10-CM | POA: Diagnosis present

## 2023-10-08 HISTORY — DX: Unspecified convulsions: R56.9

## 2023-10-08 LAB — CBC WITH DIFFERENTIAL/PLATELET
Abs Immature Granulocytes: 0.12 10*3/uL — ABNORMAL HIGH (ref 0.00–0.07)
Basophils Absolute: 0.1 10*3/uL (ref 0.0–0.1)
Basophils Relative: 0 %
Eosinophils Absolute: 0.1 10*3/uL (ref 0.0–0.5)
Eosinophils Relative: 1 %
HCT: 34.5 % — ABNORMAL LOW (ref 36.0–46.0)
Hemoglobin: 11.1 g/dL — ABNORMAL LOW (ref 12.0–15.0)
Immature Granulocytes: 1 %
Lymphocytes Relative: 3 %
Lymphs Abs: 0.6 10*3/uL — ABNORMAL LOW (ref 0.7–4.0)
MCH: 29.8 pg (ref 26.0–34.0)
MCHC: 32.2 g/dL (ref 30.0–36.0)
MCV: 92.5 fL (ref 80.0–100.0)
Monocytes Absolute: 1.4 10*3/uL — ABNORMAL HIGH (ref 0.1–1.0)
Monocytes Relative: 7 %
Neutro Abs: 16.6 10*3/uL — ABNORMAL HIGH (ref 1.7–7.7)
Neutrophils Relative %: 88 %
Platelets: 423 10*3/uL — ABNORMAL HIGH (ref 150–400)
RBC: 3.73 MIL/uL — ABNORMAL LOW (ref 3.87–5.11)
RDW: 15 % (ref 11.5–15.5)
Smear Review: ADEQUATE
WBC: 18.9 10*3/uL — ABNORMAL HIGH (ref 4.0–10.5)
nRBC: 0 % (ref 0.0–0.2)

## 2023-10-08 LAB — URINALYSIS, ROUTINE W REFLEX MICROSCOPIC
Bilirubin Urine: NEGATIVE
Glucose, UA: NEGATIVE mg/dL
Hgb urine dipstick: NEGATIVE
Ketones, ur: 20 mg/dL — AB
Leukocytes,Ua: NEGATIVE
Nitrite: NEGATIVE
Protein, ur: NEGATIVE mg/dL
Specific Gravity, Urine: >1.046 — ABNORMAL HIGH (ref 1.005–1.030)
pH: 6 (ref 5.0–8.0)

## 2023-10-08 LAB — COMPREHENSIVE METABOLIC PANEL
ALT: 9 U/L (ref 0–44)
AST: 26 U/L (ref 15–41)
Albumin: 1.9 g/dL — ABNORMAL LOW (ref 3.5–5.0)
Alkaline Phosphatase: 76 U/L (ref 38–126)
Anion gap: 16 — ABNORMAL HIGH (ref 5–15)
BUN: 8 mg/dL (ref 6–20)
CO2: 27 mmol/L (ref 22–32)
Calcium: 8.1 mg/dL — ABNORMAL LOW (ref 8.9–10.3)
Chloride: 91 mmol/L — ABNORMAL LOW (ref 98–111)
Creatinine, Ser: 0.56 mg/dL (ref 0.44–1.00)
GFR, Estimated: >60 mL/min (ref >60)
Glucose, Bld: 122 mg/dL — ABNORMAL HIGH (ref 70–99)
Potassium: 4.2 mmol/L (ref 3.5–5.1)
Sodium: 134 mmol/L — ABNORMAL LOW (ref 135–145)
Total Bilirubin: 1.8 mg/dL — ABNORMAL HIGH (ref 0.0–1.2)
Total Protein: 6.6 g/dL (ref 6.5–8.1)

## 2023-10-08 LAB — URINE DRUG SCREEN, QUALITATIVE (ARMC ONLY)
Amphetamines, Ur Screen: NOT DETECTED
Barbiturates, Ur Screen: NOT DETECTED
Benzodiazepine, Ur Scrn: POSITIVE — AB
Cannabinoid 50 Ng, Ur ~~LOC~~: NOT DETECTED
Cocaine Metabolite,Ur ~~LOC~~: NOT DETECTED
MDMA (Ecstasy)Ur Screen: NOT DETECTED
Methadone Scn, Ur: NOT DETECTED
Opiate, Ur Screen: POSITIVE — AB
Phencyclidine (PCP) Ur S: NOT DETECTED
Tricyclic, Ur Screen: NOT DETECTED

## 2023-10-08 LAB — RESP PANEL BY RT-PCR (RSV, FLU A&B, COVID)  RVPGX2
Influenza A by PCR: NEGATIVE
Influenza B by PCR: NEGATIVE
Resp Syncytial Virus by PCR: NEGATIVE
SARS Coronavirus 2 by RT PCR: NEGATIVE

## 2023-10-08 LAB — CK: Total CK: 35 U/L — ABNORMAL LOW (ref 38–234)

## 2023-10-08 LAB — LIPASE, BLOOD: Lipase: 271 U/L — ABNORMAL HIGH (ref 11–51)

## 2023-10-08 LAB — CBG MONITORING, ED
Glucose-Capillary: 133 mg/dL — ABNORMAL HIGH (ref 70–99)
Glucose-Capillary: 123 mg/dL — ABNORMAL HIGH (ref 70–99)
Glucose-Capillary: 105 mg/dL — ABNORMAL HIGH (ref 70–99)
Glucose-Capillary: 114 mg/dL — ABNORMAL HIGH (ref 70–99)
Glucose-Capillary: 114 mg/dL — ABNORMAL HIGH (ref 70–99)

## 2023-10-08 LAB — LACTIC ACID, PLASMA: Lactic Acid, Venous: 1.2 mmol/L (ref 0.5–1.9)

## 2023-10-08 LAB — PHENYTOIN LEVEL, TOTAL: Phenytoin Lvl: <2.5 ug/mL — ABNORMAL LOW (ref 10.0–20.0)

## 2023-10-08 LAB — CARBAMAZEPINE LEVEL, TOTAL: Carbamazepine Lvl: <2 ug/mL — ABNORMAL LOW (ref 4.0–12.0)

## 2023-10-08 MED ORDER — CARBAMAZEPINE 100 MG PO CHEW
100.0000 mg | CHEWABLE_TABLET | Freq: Two times a day (BID) | ORAL | Status: DC
  Administered 2023-10-08: 100 mg via NASOGASTRIC
  Filled 2023-10-08 (×4): qty 1

## 2023-10-08 MED ORDER — IOHEXOL 350 MG/ML SOLN
100.0000 mL | Freq: Once | INTRAVENOUS | Status: AC | PRN
  Administered 2023-10-08: 100 mL via INTRAVENOUS

## 2023-10-08 MED ORDER — SENNOSIDES-DOCUSATE SODIUM 8.6-50 MG PO TABS: 1.0000 | ORAL_TABLET | Freq: Every evening | ORAL | Status: DC | PRN

## 2023-10-08 MED ORDER — SODIUM CHLORIDE 0.9 % IV SOLN
3000.0000 mg | Freq: Once | INTRAVENOUS | Status: AC
  Administered 2023-10-08: 3000 mg via INTRAVENOUS
  Filled 2023-10-08: qty 30

## 2023-10-08 MED ORDER — ACETAMINOPHEN 650 MG RE SUPP: 650.0000 mg | Freq: Four times a day (QID) | RECTAL | Status: AC | PRN

## 2023-10-08 MED ORDER — ONDANSETRON HCL 4 MG/2ML IJ SOLN
4.0000 mg | Freq: Four times a day (QID) | INTRAMUSCULAR | Status: AC | PRN
  Administered 2023-10-10 – 2023-10-11 (×2): 4 mg via INTRAVENOUS
  Filled 2023-10-08 (×2): qty 2

## 2023-10-08 MED ORDER — NICOTINE 14 MG/24HR TD PT24
14.0000 mg | MEDICATED_PATCH | Freq: Every day | TRANSDERMAL | Status: DC | PRN
Start: 2023-10-08 — End: 2023-10-16

## 2023-10-08 MED ORDER — CARBAMAZEPINE 100 MG PO CHEW
100.0000 mg | CHEWABLE_TABLET | Freq: Two times a day (BID) | ORAL | Status: DC
  Filled 2023-10-08: qty 1

## 2023-10-08 MED ORDER — ONDANSETRON HCL 4 MG PO TABS: 4.0000 mg | ORAL_TABLET | Freq: Four times a day (QID) | ORAL | Status: AC | PRN

## 2023-10-08 MED ORDER — SODIUM CHLORIDE 0.9 % IV SOLN
3.0000 g | Freq: Four times a day (QID) | INTRAVENOUS | Status: DC
  Administered 2023-10-08 – 2023-10-10 (×6): 3 g via INTRAVENOUS
  Filled 2023-10-08 (×9): qty 8

## 2023-10-08 MED ORDER — GABAPENTIN 300 MG PO CAPS
300.0000 mg | ORAL_CAPSULE | Freq: Four times a day (QID) | ORAL | Status: DC
Start: 2023-10-09 — End: 2023-10-11
  Administered 2023-10-09 – 2023-10-11 (×8): 300 mg via ORAL
  Filled 2023-10-08 (×8): qty 1

## 2023-10-08 MED ORDER — ACETAMINOPHEN 325 MG PO TABS
650.0000 mg | ORAL_TABLET | Freq: Four times a day (QID) | ORAL | Status: AC | PRN
  Administered 2023-10-09 – 2023-10-12 (×5): 650 mg via ORAL
  Filled 2023-10-08 (×5): qty 2

## 2023-10-08 MED ORDER — HEPARIN SODIUM (PORCINE) 5000 UNIT/ML IJ SOLN
5000.0000 [IU] | Freq: Three times a day (TID) | INTRAMUSCULAR | Status: DC
  Administered 2023-10-09 – 2023-10-10 (×3): 5000 [IU] via SUBCUTANEOUS
  Filled 2023-10-08 (×4): qty 1

## 2023-10-08 MED ORDER — LEVETIRACETAM IN NACL 1500 MG/100ML IV SOLN
1500.0000 mg | Freq: Two times a day (BID) | INTRAVENOUS | Status: DC
  Administered 2023-10-09 – 2023-10-10 (×3): 1500 mg via INTRAVENOUS
  Filled 2023-10-08 (×6): qty 100

## 2023-10-08 MED ORDER — METOPROLOL TARTRATE 5 MG/5ML IV SOLN: 5.0000 mg | INTRAVENOUS | Status: DC | PRN

## 2023-10-08 MED ORDER — DULOXETINE HCL 60 MG PO CPEP: 60.0000 mg | ORAL_CAPSULE | Freq: Every day | ORAL | Status: DC

## 2023-10-08 MED ORDER — SODIUM CHLORIDE 0.9 % IV BOLUS
1000.0000 mL | Freq: Once | INTRAVENOUS | Status: AC
  Administered 2023-10-08: 1000 mL via INTRAVENOUS

## 2023-10-08 MED ORDER — LORAZEPAM 2 MG/ML IJ SOLN: 1.0000 mg | INTRAMUSCULAR | Status: DC | PRN

## 2023-10-08 MED ORDER — MELATONIN 5 MG PO TABS
10.0000 mg | ORAL_TABLET | Freq: Every day | ORAL | Status: DC
Start: 2023-10-08 — End: 2023-10-16
  Administered 2023-10-08 – 2023-10-15 (×8): 10 mg via ORAL
  Filled 2023-10-08 (×8): qty 2

## 2023-10-08 MED ORDER — LEVETIRACETAM IN NACL 1500 MG/100ML IV SOLN
1500.0000 mg | Freq: Once | INTRAVENOUS | Status: DC
  Filled 2023-10-08: qty 100

## 2023-10-08 NOTE — ED Notes (Addendum)
Pt still unable to move extremities but is answering orientation questions appropriately. Pt still quick to fall asleep

## 2023-10-08 NOTE — ED Provider Notes (Signed)
[facility] Provider Note   Event Date/Time   First MD Initiated Contact with Patient 10/08/23 [phone]     (approximate)  History   Seizures  EM caveat limitation, patient lethargic  HPI  Abigail [name] is a 58 y.o. female whom I spoke with son Abigail [name].  Also obtained history from EMS.  Patient had an unknown period of possibly several hours of seizure-like activity.  Around 5 this morning was noted to have shaking and continuous seizures.  Her son gave her her intranasal antiepileptic but after 30 minutes this did not improve.  He called EMS.  EMS reports patient having generalized seizure-like activity on their arrival.  Improved after receiving a total of 4 mg of midazolam via intramuscular and intravenous   Patient's mental status was "unresponsive" but is now starting to come around.  No known history of falls or injuries.  Glucose was in normal range when EMS checked    Obtained additional history from the patient's son Abigail [name].  She left the hospital just a few days ago and is not believed to be taking Vimpat or phenytoin any longer.  She was supposed to start carbamazepine but the pharmacy did not yet have it supposed to be ready today.  She is believed to be taking levetiracetam   Physical Exam   Triage Vital Signs: ED Triage Vitals  Encounter Vitals Group     BP 10/08/23 0717 121/69     Systolic BP Percentile --      Diastolic BP Percentile --      Pulse Rate 10/08/23 0717 (!) 117     Resp 10/08/23 0717 16     Temp 10/08/23 0720 98.8 F (37.1 C)     Temp Source 10/08/23 0720 Axillary     SpO2 10/08/23 0717 93 %     Weight 10/08/23 0719 149 lb 14.6 oz (68 kg)     Height 10/08/23 0719 5\' 7"  (1.702 m)     Head Circumference --      Peak Flow --      Pain Score --      Pain Loc --      Pain Education --      Exclude from Growth Chart --     Most recent vital signs: Vitals:   10/08/23 1230 10/08/23 1300  BP: 125/86 107/81   Pulse: (!) 114 (!) 113  Resp: 17 (!) 21  Temp:    SpO2: 95% 95%     General: Opens her eyes spontaneously.  Protecting her airway well.  After about 5 minutes of time in the room when spoken to she will mouth 1 or 2 words, able to tell me her name.  She has no generalized shaking.  She is flaccid extremities.  She is lethargic CV:  Good peripheral perfusion.  Slight tachycardia no murmurResp:  Normal effort.  Clear bilateral Abd:  No distention.  Soft nontender nondistended Other:  Ranges extremities well passively, no obvious injuries.  Normocephalic atraumatic   ED Results / Procedures / Treatments   Labs (all labs ordered are listed, but only abnormal results are displayed) Labs Reviewed  COMPREHENSIVE METABOLIC PANEL - Abnormal; Notable for the following components:      Result Value   Sodium 134 (*)    Chloride 91 (*)    Glucose, Bld 122 (*)    Calcium 8.1 (*)    Albumin 1.9 (*)    Total Bilirubin 1.8 (*)    Anion  gap 16 (*)    All other components within normal limits  CBC WITH DIFFERENTIAL/PLATELET - Abnormal; Notable for the following components:   WBC 18.9 (*)    RBC 3.73 (*)    Hemoglobin 11.1 (*)    HCT 34.5 (*)    Platelets 423 (*)    Neutro Abs 16.6 (*)    Lymphs Abs 0.6 (*)    Monocytes Absolute 1.4 (*)    Abs Immature Granulocytes 0.12 (*)    All other components within normal limits  URINALYSIS, ROUTINE W REFLEX MICROSCOPIC - Abnormal; Notable for the following components:   Color, Urine YELLOW (*)    APPearance CLEAR (*)    Specific Gravity, Urine >1.046 (*)    Ketones, ur 20 (*)    All other components within normal limits  CK - Abnormal; Notable for the following components:   Total CK 35 (*)    All other components within normal limits  PHENYTOIN LEVEL, TOTAL - Abnormal; Notable for the following components:   Phenytoin Lvl <2.5 (*)    All other components within normal limits  LIPASE, BLOOD - Abnormal; Notable for the following components:    Lipase 271 (*)    All other components within normal limits  CARBAMAZEPINE LEVEL, TOTAL - Abnormal; Notable for the following components:   Carbamazepine Lvl <2.0 (*)    All other components within normal limits  CBG MONITORING, ED - Abnormal; Notable for the following components:   Glucose-Capillary 133 (*)    All other components within normal limits  CBG MONITORING, ED - Abnormal; Notable for the following components:   Glucose-Capillary 123 (*)    All other components within normal limits  CBG MONITORING, ED - Abnormal; Notable for the following components:   Glucose-Capillary 105 (*)    All other components within normal limits  RESP PANEL BY RT-PCR (RSV, FLU A&B, COVID)  RVPGX2  URINE DRUG SCREEN, QUALITATIVE ([facility] ONLY)  LEVETIRACETAM LEVEL  LACTIC ACID, PLASMA  LACTIC ACID, PLASMA  CBG MONITORING, ED  CBG MONITORING, ED  CBG MONITORING, ED  CBG MONITORING, ED  CBG MONITORING, ED  CBG MONITORING, ED  CBG MONITORING, ED     EKG  And inter by me at 720 heart rate 110 QRS 90 QTc 450 Sinus tachycardia no evidence of acute ischemia   RADIOLOGY CT head interpreted me as grossly negative for acute abnormality   EEG adult Result Date: 10/08/2023 Abigail Quest, MD     10/08/2023 12:19 PM Patient Name: Abigail [name] [facility] MRN: [id number] Epilepsy Attending: Charlsie [name] Referring Physician/Provider: Caryl Pina, MD Date: 10/08/2023 Duration: 34.42 mins Patient history:  58 yo F brought in after being found seizing by her son at home at about 5 AM this morning. EEG to evaluate for seizure Level of alertness: Awake, asleep AEDs during EEG study: LEV, CBZ Technical aspects: This EEG study was done with scalp electrodes positioned according to the 10-20 International system of electrode placement. Electrical activity was reviewed with band pass filter of 1-70Hz , sensitivity of 7 uV/mm, display speed of 14mm/sec with a 60Hz  notched filter applied as appropriate. EEG data  were recorded continuously and digitally stored.  Video monitoring was available and reviewed as appropriate. Description: The posterior dominant rhythm consists of 7 Hz activity of moderate voltage (25-35 uV) seen predominantly in posterior head regions, symmetric and reactive to eye opening and eye closing. Sleep was characterized by vertex waves, sleep spindles (12 to 14 Hz), maximal frontocentral region.  EEG showed continuous generalized polymorphic sharply contoured 5 to 7 Hz theta slowing. Hyperventilation and photic stimulation were not performed.   ABNORMALITY - Continuous slow, generalized IMPRESSION: This study is suggestive of mild to moderate diffuse encephalopathy. No seizures or epileptiform discharges were seen throughout the recording. [name]   CT ANGIO HEAD NECK W WO CM W PERF Result Date: 10/08/2023 CLINICAL DATA:  Provided history: Neuro deficit, acute, stroke suspected. Additional history provided: Seizure with subsequent dense left hemiparesis. EXAM: CT ANGIOGRAPHY HEAD AND NECK CT PERFUSION BRAIN TECHNIQUE: Multidetector CT imaging of the head and neck was performed using the standard protocol during bolus administration of intravenous contrast. Multiplanar CT image reconstructions and MIPs were obtained to evaluate the vascular anatomy. Carotid stenosis measurements (when applicable) are obtained utilizing NASCET criteria, using the distal internal carotid diameter as the denominator. Multiphase CT imaging of the brain was performed following IV bolus contrast injection. Subsequent parametric perfusion maps were calculated using RAPID software. RADIATION DOSE REDUCTION: This exam was performed according to the departmental dose-optimization program which includes automated exposure control, adjustment of the mA and/or kV according to patient size and/or use of iterative reconstruction technique. CONTRAST:  OMNIPAQUE IOHEXOL 350 MG/ML SOLN COMPARISON:  Non-contrast head CT  performed earlier today 10/08/2023. FINDINGS: CTA NECK FINDINGS Aortic arch: Standard aortic branching. The visualized thoracic aorta is normal in caliber. No hemodynamically significant innominate or proximal subclavian artery stenosis. Right carotid system: CCA and ICA patent within the neck without atherosclerotic stenosis. Mild atherosclerotic plaque about the carotid bifurcation and within the proximal ICA. A 50% narrowing of the proximal CCA appears to be related to vessel tortuosity rather than atherosclerotic plaque. Tortuosity of the mid to distal cervical ICA. No evidence of dissection. Left carotid system: CCA and ICA patent within the neck without stenosis. Mild atherosclerotic plaque at the carotid bifurcation. No evidence of dissection. Vertebral arteries: Codominant and patent within the neck without stenosis. Skeleton: Levocurvature of the cervical spine. Partially imaged dextrocurvature of the mid/upper thoracic spine. Prior C5-C7 ACDF. Cervical spondylosis. No acute fracture or aggressive osseous lesion. Other neck: No neck mass or cervical lymphadenopathy. Upper chest: Motion degradation limits evaluation of the imaged lung apices. Atelectasis within the dependent aspect of the right upper lobe at the imaged levels. Review of the MIP images confirms the above findings CTA HEAD FINDINGS Anterior circulation: The intracranial internal carotid arteries are patent. Nonstenotic atherosclerotic plaque within the right ICA cavernous segment The M1 middle cerebral arteries are patent. No M2 proximal branch occlusion or high-grade proximal stenosis. The anterior cerebral arteries are patent. No intracranial aneurysm is identified. Posterior circulation: The intracranial vertebral arteries are patent. The basilar artery is patent. The posterior cerebral arteries are patent. A small right posterior communicating artery is present. The left posterior communicating artery is diminutive or absent. Venous  sinuses: Within the limitations of contrast timing, no convincing thrombus. Anatomic variants: As described. Review of the MIP images confirms the above findings CT Brain Perfusion Findings: Poor quality of the source data/contrast bolus limits reliability of the perfusion software estimates. Within this limitation, findings are as follows. CBF (<30%) Volume: 0mL Perfusion (Tmax>6.0s) volume: 30mL (scattered within the bilateral frontal and temporal lobes). Mismatch Volume: 30mL Infarction Location:None identified. No emergent large vessel occlusion identified. These results were called by telephone at the time of interpretation on 10/08/2023 at 10:10 am to provider [facility] , who verbally acknowledged these results. IMPRESSION: CTA neck: 1. The common carotid and internal carotid  arteries are patent within the neck without neck significant atherosclerotic stenosis. Mild atherosclerotic plaque bilaterally, as described. 50% narrowing of the proximal right common carotid artery appears to be related to vessel tortuosity rather than atherosclerotic disease. 2. The vertebral arteries are patent within the neck without stenosis or significant atherosclerotic disease. CTA head: 1. No proximal intracranial large vessel occlusion or high-grade proximal arterial stenosis identified. 2. Non-stenotic atherosclerotic plaque within the right ICA cavernous segment. CT perfusion head: 1. Poor quality of the source data/contrast bolus limits reliability of the perfusion software estimates. Within this limitation, findings are as follows. 2. The perfusion software identifies no core infarct. The perfusion software identifies regions of hypoperfused parenchyma, totaling 30 mL, scattered within the bilateral frontal and temporal lobes (utilizing the Tmax>6 seconds threshold). Reported mismatch volume: 30 mL. Electronically Signed   By: Jackey Loge D.O.   On: 10/08/2023 10:36   CT Head Wo Contrast Result Date:  10/08/2023 CLINICAL DATA:  Status epilepticus. EXAM: CT HEAD WITHOUT CONTRAST TECHNIQUE: Contiguous axial images were obtained from the base of the skull through the vertex without intravenous contrast. RADIATION DOSE REDUCTION: This exam was performed according to the departmental dose-optimization program which includes automated exposure control, adjustment of the mA and/or kV according to patient size and/or use of iterative reconstruction technique. COMPARISON:  09/29/2023 FINDINGS: Brain: No evidence of intracranial hemorrhage, acute infarction, hydrocephalus, extra-axial collection, or mass lesion/mass effect. Vascular:  No hyperdense vessel or other acute findings. Skull: No evidence of fracture or other significant bone abnormality. Sinuses/Orbits: No acute findings. Chronic opacification of left maxillary sinus again noted. Other: None. IMPRESSION: No acute intracranial abnormality. Chronic left maxillary sinus disease. Electronically Signed   By: Danae Orleans M.D.   On: 10/08/2023 08:15     PROCEDURES:  Critical Care performed: Yes, see critical care procedure note(s)  CRITICAL CARE Performed by: Sharyn Creamer   Total critical care time: 30 minutes  Critical care time was exclusive of separately billable procedures and treating other patients.  Critical care was necessary to treat or prevent imminent or life-threatening deterioration.  Critical care was time spent personally by me on the following activities: development of treatment plan with patient and/or surrogate as well as nursing, discussions with consultants, evaluation of patient's response to treatment, examination of patient, obtaining history from patient or surrogate, ordering and performing treatments and interventions, ordering and review of laboratory studies, ordering and review of radiographic studies, pulse oximetry and re-evaluation of patient's condition.  Procedures   MEDICATIONS ORDERED IN ED: Medications   levETIRAcetam (KEPPRA) IVPB 1500 mg/ 100 mL premix (has no administration in time range)  carbamazepine (TEGRETOL) chewable tablet 100 mg (0 mg Oral Hold 10/08/23 1204)  acetaminophen (TYLENOL) tablet 650 mg (has no administration in time range)    Or  acetaminophen (TYLENOL) suppository 650 mg (has no administration in time range)  ondansetron (ZOFRAN) tablet 4 mg (has no administration in time range)    Or  ondansetron (ZOFRAN) injection 4 mg (has no administration in time range)  heparin injection 5,000 Units (has no administration in time range)  senna-docusate (Senokot-S) tablet 1 tablet (has no administration in time range)  Ampicillin-Sulbactam (UNASYN) 3 g in sodium chloride 0.9 % 100 mL IVPB (has no administration in time range)  sodium chloride 0.9 % bolus 1,000 mL (0 mLs Intravenous Stopped 10/08/23 1129)  levETIRAcetam (KEPPRA) 3,000 mg in sodium chloride 0.9 % 250 mL IVPB (0 mg Intravenous Stopped 10/08/23 0913)  iohexol (OMNIPAQUE) 350 MG/ML  injection 100 mL (100 mLs Intravenous Contrast Given 10/08/23 0953)     IMPRESSION / MDM / ASSESSMENT AND PLAN / ED COURSE  I reviewed the triage vital signs and the nursing notes.                              Differential diagnosis includes, but is not limited to, known history of seizure, epilepsy or potentially underlying acute medical condition such as acute illness acute metabolic toxic medication related, electrolyte abnormality etc.  Recently admitted for pancreatitis.  In the son's planning to bring all of her medications with to further delineate exact medication she is taking, but at this point I will load her with Keppra.  She is known to be taking Keppra or at least felt to be taking it, and had previously tapered off of phenytoin but also believe Vimpat.  Thankfully the patient's mental status slowly improving she is currently protecting her airway.  Will continue to observe her closely loaded with Keppra.  Hemodynamics are stable  with exception of slight tachycardia.  Extremity is slightly dry will hydrate.  Await workup.  Consulted with Dr. Jerrell Belfast of neurology at [facility] who advises load with Keppra 3 g.  Patient's presentation is most consistent with acute presentation with potential threat to life or bodily function.    The patient is on the cardiac monitor to evaluate for evidence of arrhythmia and/or significant heart rate changes.  Clinical Course as of 10/08/23 1344  Tue Oct 08, 2023  4540 Patient is noted on reevaluation to have weakness on the left arm and left leg.  Neurology Dr. Otelia Limes is advised that he is ordered additional advanced imaging to evaluate if there could potentially be an acute stroke component, though Todd's paralysis is also a strong consideration.  Attempted to call the patient again son to gather last known well, at this point is believed that she was found about 5 or 5:30 AM with active seizure by her son.  Last known well is not clear, attempted to call the patient's son and phone rings but advises voicemail was full.  Unable to leave message at the same phone number I contact him in earlier [MQ]    Clinical Course User Index [MQ] Sharyn Creamer, MD   ----------------------------------------- 7:38 AM on 10/08/2023 ----------------------------------------- Patient alertness steadily improving.  Now alert to person and place as well as month of February.   ----------------------------------------- 12:18 PM on 10/08/2023 ----------------------------------------- Resting without distress.  EEG is just been completed along with advanced perfusion imaging.  Per Dr. Otelia Limes our neurologist patient is appropriate for admission to hospitalist service.  No further treatments or interventions ordered at this time neurology will continue to follow.  Patient's family at the bedside updated understand plan for admission.  CXR is awaiting final read by radiologist Dr. Sedalia Muta aware.  To my  interpretation concerning for bibasilar consolidation and right-sided pleural effusion.  Dr. Sedalia Muta discussed and reports she is also reviewed it, plans to initiate antibiotic therapy for possible aspiration  Consulted with our hospitalist Dr. Sedalia Muta, who is accepting of admission  Labs demonstrate leukocytosis with left shift.  Awaiting urinalysis have ordered In-N-Out catheterization.  No obvious findings of acute bacterial illness to noted at this time  FINAL CLINICAL IMPRESSION(S) / ED DIAGNOSES   Final diagnoses:  Seizure (HCC)     Rx / DC Orders   ED Discharge Orders     None  Note:  This document was prepared using Dragon voice recognition software and may include unintentional dictation errors.   Sharyn Creamer, MD 10/08/23 1344

## 2023-10-08 NOTE — ED Notes (Signed)
During NG tube placement, pt was able to move both arms, attempting to stop this RN from inserting tube.

## 2023-10-08 NOTE — ED Notes (Signed)
Pt becoming a little more alert, still falls asleep quickly during A&O questions but is answering correctly at this time.

## 2023-10-08 NOTE — Procedures (Signed)
Patient Name: Abigail [name] [facility]  MRN: [id number]  Epilepsy Attending: [name]  Referring Physician/Provider: Caryl Pina, MD  Date: 10/08/2023 Duration: 34.42 mins  Patient history:  58 yo F brought in after being found seizing by her son at home at about 5 AM this morning. EEG to evaluate for seizure  Level of alertness: Awake, asleep  AEDs during EEG study: LEV, CBZ  Technical aspects: This EEG study was done with scalp electrodes positioned according to the 10-20 International system of electrode placement. Electrical activity was reviewed with band pass filter of 1-70Hz , sensitivity of 7 uV/mm, display speed of 41mm/sec with a 60Hz  notched filter applied as appropriate. EEG data were recorded continuously and digitally stored.  Video monitoring was available and reviewed as appropriate.  Description: The posterior dominant rhythm consists of 7 Hz activity of moderate voltage (25-35 uV) seen predominantly in posterior head regions, symmetric and reactive to eye opening and eye closing. Sleep was characterized by vertex waves, sleep spindles (12 to 14 Hz), maximal frontocentral region. EEG showed continuous generalized polymorphic sharply contoured 5 to 7 Hz theta slowing. Hyperventilation and photic stimulation were not performed.     ABNORMALITY - Continuous slow, generalized  IMPRESSION: This study is suggestive of mild to moderate diffuse encephalopathy. No seizures or epileptiform discharges were seen throughout the recording.  Malachai Schalk [name]

## 2023-10-08 NOTE — Consult Note (Signed)
Pharmacy Antibiotic Note  Abigail [name] is a 58 y.o. female admitted on 10/08/2023 with pneumonia.  Pharmacy has been consulted for Unasyn dosing. Afeb, WBC 18.9, on [place] 3 L/min, and tachy. CXR pending.   Plan: Unasyn 3 g q6H.   Height: 5\' 7"  (170.2 cm) Weight: 68 kg (149 lb 14.6 oz) IBW/kg (Calculated) : 61.6  Temp (24hrs), Avg:98.9 F (37.2 C), Min:98.8 F (37.1 C), Max:98.9 F (37.2 C)  Recent Labs  Lab 10/02/23 0425 10/08/23 0722 10/08/23 1335  WBC  --  18.9*  --   CREATININE 0.36* 0.56  --   LATICACIDVEN  --   --  1.2    Estimated Creatinine Clearance: 74.5 mL/min (by C-G formula based on SCr of 0.56 mg/dL).    Allergies  Allergen Reactions   Nsaids Hives   Tapentadol Swelling, Rash and Other (See Comments)    Nucynta- Made her deathly sick   Cephalexin Other (See Comments)    Reaction not cited   Codeine Other (See Comments)    Reaction not cited   Darvocet [Propoxyphene N-Acetaminophen] Other (See Comments)    Reaction not cited   Latex Other (See Comments)    Reaction not cited   Silicone Other (See Comments)    Reaction not cited   Sulfa Antibiotics Hives   Tape Other (See Comments)    Reaction not cited   Meloxicam Rash    Antimicrobials this admission: 2/18 Unasyn >>   Dose adjustments this admission: None  Microbiology results: 2/18 BCx: pending  Thank you for allowing pharmacy to be a part of this patient's care.  Ronnald Ramp, PharmD, BCPS 10/08/2023 2:09 PM

## 2023-10-08 NOTE — Assessment & Plan Note (Signed)
MOST form in ACP reviewed

## 2023-10-08 NOTE — ED Notes (Signed)
Pt in CT w primary RN, stroke RN and neurologist.

## 2023-10-08 NOTE — ED Triage Notes (Signed)
Pt to ED from home, [facility] for status epilepticus  15mg  Valtoco given 2X by family, IN route Pt had continuous seizures with EMS en route, finally stopped seizing after second dose Versed. Seizures were tonic clionic, generalized  2mg  Versed IM, 2mg  Versed through EJ BP was 82/40 after Versed, then NS given, 116/70 after bolus Per family, SPO2 was 60% on [place] 2L which she wears chronically. EMS placed pt on NRB at 15L, SPO2 >90% with NRB HR was 148 then 117 after NS bolus, CBG 150, ET CO@ 38, RR 26  Has 3 back fractures. Hx HTN, COPD  Somewhat responsive to verbal stimulation and questions

## 2023-10-08 NOTE — Assessment & Plan Note (Signed)
Home duloxetine 600 mg daily resumed

## 2023-10-08 NOTE — Assessment & Plan Note (Signed)
 -  As needed nicotine patch ordered ?

## 2023-10-08 NOTE — ED Notes (Signed)
 Pt returned from CT

## 2023-10-08 NOTE — Assessment & Plan Note (Signed)
Seizure precaution

## 2023-10-08 NOTE — Assessment & Plan Note (Addendum)
Status post Keppra loading dose of 3000 mg IV in the ED Continue Keppra 1500 mg IV twice daily, carbamazepine 100 mg p.o. twice daily per NG tube as recommended by neurologist NG tube placement order Ativan 1 mg IV as needed for breakthrough seizure, 2 doses ordered with instructions to administer as appropriate and then let provider know

## 2023-10-08 NOTE — Assessment & Plan Note (Signed)
With suspected aspiration pneumonia Pending formal radiologist read on chest x-ray Unasyn per pharmacy has been initiated on admission Incentive spirometry and flutter valve every 2 hours Fall and aspiration precaution

## 2023-10-08 NOTE — ED Notes (Signed)
 XR at bedside

## 2023-10-08 NOTE — H&P (Addendum)
History and Physical   Abigail [name] [facility] ZOX:[id number] DOB: Jan 17, 1966 DOA: 10/08/2023  PCP: Alease Medina, MD  Outpatient Specialists: Dr. Ger[facility] neurology Patient coming from: Home via EMS  I have personally briefly reviewed patient's old medical records in [facility] EMR.  Chief Concern: Seizure  HPI: [name] is a 58 year old female with history of giant cell arteritis, seizure with status epilepticus, chronic pain, who presents emergency department from home for chief concerns of seizure.  Patient was discharged from the hospital on 10/04/2023, and reportedly has not been taking her prescribed seizure medications.  Vitals in the ED showed temperature of 98.9, respiration rate 16, heart rate 117, blood pressure 121/69, SpO2 of 93% on 3 L nasal cannula.  Serum sodium is 134, potassium 4.2, chloride 91, bicarb 27, BUN of 8, serum creatinine 0.56, EGFR greater than 60, nonfasting blood glucose 122, WBC 18.9, hemoglobin 11.1, platelets of 423.  CK is 35. COVID/influenza A/influenza B/RSV PCR were negative.  ED treatment: Patient was loaded with Keppra and neurology was consulted.  Neurology specialists recommended: Stat EEG, continue Keppra 1500 mg IV twice daily, carbamazepine 100 mg p.o. twice daily (may need NGT for administration), check carbamazepine level, and a stat CTA head and neck with CTP to rule out LVO.  CTA head neck: Read as no proximal intracranial large vessel occlusion or high-grade proximal artery stenosis identified. -------------------------------- At bedside, patient is postictal and appears to be in a deep sleep.  Patient moves with sternal rubbing and loud verbal stimuli.  Her mother is at bedside.  Mother does not live with the patient therefore she does not know if patient is compliant with her home seizure medication.  Mother at bedside does think that patient has had her seizure medication change, titrating down on  medication while starting a new 1 and she believes that her daughter was confused regarding which medication she should be taking.  Social history: Patient lives at home with her son.  Patient is a tobacco user, and mother does not think that she has smoked in the last 1 week.  Patient does not drink alcohol anymore.  Mother does not think the patient consumes recreational drug use.  ROS: Unable to complete as patient is postictal  ED Course: Discussed with EDP, patient requiring hospitalization for chief concerns of seizure.   Assessment/Plan  Principal Problem:   Seizure (HCC) Active Problems:   Status epilepticus (HCC)   History of seizure   SIRS (systemic inflammatory response syndrome) (HCC)   Aspiration pneumonia (HCC)   GERD (gastroesophageal reflux disease)   Depression   Rheumatoid arthritis (HCC)   Anxiety   Chronic pain   Giant cell arteritis (HCC)   DNR (do not resuscitate)   Protein-calorie malnutrition, severe   Tobacco use disorder   Assessment and Plan:  * Seizure (HCC) With reported status epilepticus Seizure precaution, fall precaution, aspiration precaution Continue Keppra 1500 mg IV twice daily and carbamazepine 100 mg p.o. twice daily as recommended by neurologist Carbamazepine level is subtherapeutic Neurology has been consulted by EDP, appreciate further recommendation and guidance  Aspiration pneumonia (HCC) In setting of increased opacity in the right lower lobe, leukocytosis Unasyn per pharmacy ordered on admission Oxygen therapy with nasal cannula as needed to maintain SpO2 greater than 92% Continuous pulse oximetry  SIRS (systemic inflammatory response syndrome) (HCC) With suspected aspiration pneumonia Pending formal radiologist read on chest x-ray Unasyn per pharmacy has been initiated on admission Incentive spirometry and flutter  valve every 2 hours Fall and aspiration precaution  History of seizure Seizure precaution  Status  epilepticus (HCC) Status post Keppra loading dose of 3000 mg IV in the ED Continue Keppra 1500 mg IV twice daily, carbamazepine 100 mg p.o. twice daily per NG tube as recommended by neurologist NG tube placement order Ativan 1 mg IV as needed for breakthrough seizure, 2 doses ordered with instructions to administer as appropriate and then let provider know  Depression Home duloxetine 60 mg daily resumed  Tobacco use disorder As needed nicotine patch ordered  DNR (do not resuscitate) MOST form in ACP reviewed  Chronic pain PDMP reviewed Patient currently has active prescription for gabapentin 300 mg, 120 capsules, 30-day supply that was written and filled on 10/04/2023 Lacosamide 200 mg tablet, 60 tablet, 30-day supply filled on 09/10/2023.  Anxiety Home duloxetine 600 mg daily resumed  Chart reviewed.   DVT prophylaxis: Heparin 5000 units subcutaneous every 8 hours starting on 10/09/2023 Code Status: DNR/DNI, MOST form at bedside Diet: N.p.o. as patient is postictal; SLP ordered for 10/09/2023 Family Communication: Extensive discussion with mother at bedside Disposition Plan: Pending clinical course Consults called: Neurology Admission status: inpatient medical telemetry  Past Medical History:  Diagnosis Date   Anxiety    Arthritis    Back pain    Basal cell carcinoma 10/04/2021   R axilla - needs excised 11/28/21   Basal cell carcinoma 10/04/2021   L antecubital excised 11/14/21   Diarrhea 11/12/2016   Fibromyalgia    Generalized abdominal pain 11/12/2016   H. pylori infection    Hyperlipidemia    IBS (irritable bowel syndrome)    Infectious colitis 04/29/2016   Migraines    Moderate dehydration 04/29/2016   Muscle pain    Opioid overdose (HCC) 12/07/2022   Reflux    Seizures (HCC)    Unexplained weight loss 11/12/2016   Past Surgical History:  Procedure Laterality Date   ABDOMINAL HYSTERECTOMY     APPENDECTOMY  2009   C5 FUSION     C6 FUSION     C7 FUSION      COLONOSCOPY  02/2006   COLONOSCOPY WITH PROPOFOL N/A 12/25/2016   Procedure: COLONOSCOPY WITH PROPOFOL;  Surgeon: Earline Mayotte, MD;  Location: [facility] ENDOSCOPY;  Service: Endoscopy;  Laterality: N/A;   ESOPHAGOGASTRODUODENOSCOPY (EGD) WITH PROPOFOL N/A 12/25/2016   Procedure: ESOPHAGOGASTRODUODENOSCOPY (EGD) WITH PROPOFOL;  Surgeon: Earline Mayotte, MD;  Location: [facility] ENDOSCOPY;  Service: Endoscopy;  Laterality: N/A;   FOOT SURGERY     HIP ARTHROPLASTY     L4 FUSION     L5 FUSION     S1 FUSION     Social History:  reports that she has been smoking cigarettes. She has never used smokeless tobacco. She reports that she does not currently use alcohol. She reports that she does not currently use drugs.  Allergies  Allergen Reactions   Nsaids Hives   Tapentadol Swelling, Rash and Other (See Comments)    Nucynta- Made her deathly sick   Cephalexin Other (See Comments)    Reaction not cited   Codeine Other (See Comments)    Reaction not cited   Darvocet [Propoxyphene N-Acetaminophen] Other (See Comments)    Reaction not cited   Latex Other (See Comments)    Reaction not cited   Silicone Other (See Comments)    Reaction not cited   Sulfa Antibiotics Hives   Tape Other (See Comments)    Reaction not cited   Meloxicam Rash  Family History  Problem Relation Age of Onset   Diabetes Father    Hypertension Father    Family history: Family history reviewed and not pertinent.  Prior to Admission medications   Medication Sig Start Date End Date Taking? Authorizing Provider  bisacodyl (DULCOLAX) 5 MG EC tablet Take 1 tablet (5 mg total) by mouth daily as needed for moderate constipation. 10/04/23   Delfino Lovett, MD  carbamazepine (TEGRETOL XR) 100 MG 12 hr tablet Take 1 tablet (100 mg total) by mouth 2 (two) times daily. 10/04/23 11/03/23  Delfino Lovett, MD  DULoxetine (CYMBALTA) 60 MG capsule Take 1 capsule (60 mg total) by mouth daily. 10/05/23 11/04/23  Delfino Lovett, MD  gabapentin  (NEURONTIN) 300 MG capsule Take 1 capsule (300 mg total) by mouth 4 (four) times daily. 10/04/23 11/03/23  Delfino Lovett, MD  levETIRAcetam (KEPPRA) 750 MG tablet Take 2 tablets (1,500 mg total) by mouth 2 (two) times daily. 10/04/23 11/03/23  Delfino Lovett, MD  Melatonin 10 MG TABS Take 10 mg by mouth at bedtime.    [provider]  mirtazapine (REMERON) 7.5 MG tablet Take 1 tablet (7.5 mg total) by mouth at bedtime. 10/04/23 11/03/23  Delfino Lovett, MD  modafinil (PROVIGIL) 200 MG tablet Take 200 mg by mouth daily. 08/07/23   [provider]  morphine (MSIR) 15 MG tablet Take by mouth.    [provider]  nicotine (NICODERM CQ - DOSED IN MG/24 HOURS) 14 mg/24hr patch One patch chest wall daily (okay to substitute generic) 07/15/23   Alford Highland, MD  NON FORMULARY Apply 1 Application topically 3 (three) times daily as needed. Rock Salt Cream (Similar to [employer])    [provider]  ondansetron (ZOFRAN-ODT) 8 MG disintegrating tablet Take 1 tablet (8 mg total) by mouth every 8 (eight) hours as needed for nausea. 09/23/23   [name], [name], MD  polyethylene glycol powder (GLYCOLAX/MIRALAX) 17 GM/SCOOP powder Mix 17 g with a beverage and drink once daily. 10/05/23   Delfino Lovett, MD  senna-docusate (SENOKOT-S) 8.6-50 MG tablet Take 1 tablet by mouth 2 (two) times daily. 10/04/23 11/03/23  Delfino Lovett, MD   Physical Exam: Vitals:   10/08/23 1330 10/08/23 1400 10/08/23 1430 10/08/23 1525  BP: 118/89 127/79 139/71   Pulse: (!) 108 (!) 107 (!) 119   Resp: 18 (!) 21 (!) 29   Temp:    99.9 F (37.7 C)  TempSrc:    Axillary  SpO2: 95% 94% 94%   Weight:      Height:       Constitutional: appears older than chronological age, frail, chronically and acutely ill Eyes: PERRL, lids and conjunctivae normal ENMT: Mucous membranes are dry. Posterior pharynx clear of any exudate or lesions. Age-appropriate dentition.  Unable to assess patient hearing Neck: normal, supple, no masses,  no thyromegaly Respiratory: clear to auscultation bilaterally, no wheezing, no crackles. Normal respiratory effort. No accessory muscle use.  Cardiovascular: Regular rate and rhythm, no murmurs / rubs / gallops. No extremity edema. 2+ pedal pulses. No carotid bruits.  Abdomen: no tenderness, no masses palpated, no hepatosplenomegaly. Bowel sounds positive.  Musculoskeletal: no clubbing / cyanosis. No joint deformity upper and lower extremities.  Unable to assess range of motion, muscle tone.  No contractures, and no atrophy. Skin: no rashes, lesions, ulcers. No induration Neurologic: Unable to assess sensation and strength Psychiatric: Unable to assess judgment, insight, mood.  Patient is not awake, alert, oriented   EKG: independently reviewed, showing sinus  tachycardia with rate of 115, QTc 450  Chest x-ray on Admission: I personally reviewed and I agree with radiologist reading as below.  DG Chest Portable 1 View Result Date: 10/08/2023 CLINICAL DATA:  eval for aspiration Seizure. EXAM: PORTABLE CHEST 1 VIEW COMPARISON:  Radiograph 07/14/2023, CT 07/15/2023 FINDINGS: Low lung volumes and anti lordotic positioning limit assessment. Bibasilar volume loss with streaky bibasilar opacities, favor atelectasis. Stable heart size and mediastinal contours allowing for differences in technique. No pneumothorax. Difficult to assess for effusion on the current exam. IMPRESSION: Very low lung volumes limiting assessment. Streaky bibasilar opacities are similar to prior exam, favor atelectasis. Electronically Signed   By: Narda Rutherford M.D.   On: 10/08/2023 15:25   EEG adult Result Date: 10/08/2023 [name], MD     10/08/2023 12:19 PM Patient Name: Abigail [name] [facility] MRN: [id number] Epilepsy Attending: [name] Referring Physician/Provider: Caryl Pina, MD Date: 10/08/2023 Duration: 34.42 mins Patient history:  58 yo F brought in after being found seizing by her son at home at about 5  AM this morning. EEG to evaluate for seizure Level of alertness: Awake, asleep AEDs during EEG study: LEV, CBZ Technical aspects: This EEG study was done with scalp electrodes positioned according to the 10-20 International system of electrode placement. Electrical activity was reviewed with band pass filter of 1-70Hz , sensitivity of 7 uV/mm, display speed of 22mm/sec with a 60Hz  notched filter applied as appropriate. EEG data were recorded continuously and digitally stored.  Video monitoring was available and reviewed as appropriate. Description: The posterior dominant rhythm consists of 7 Hz activity of moderate voltage (25-35 uV) seen predominantly in posterior head regions, symmetric and reactive to eye opening and eye closing. Sleep was characterized by vertex waves, sleep spindles (12 to 14 Hz), maximal frontocentral region. EEG showed continuous generalized polymorphic sharply contoured 5 to 7 Hz theta slowing. Hyperventilation and photic stimulation were not performed.   ABNORMALITY - Continuous slow, generalized IMPRESSION: This study is suggestive of mild to moderate diffuse encephalopathy. No seizures or epileptiform discharges were seen throughout the recording. [name]   CT ANGIO HEAD NECK W WO CM W PERF Result Date: 10/08/2023 CLINICAL DATA:  Provided history: Neuro deficit, acute, stroke suspected. Additional history provided: Seizure with subsequent dense left hemiparesis. EXAM: CT ANGIOGRAPHY HEAD AND NECK CT PERFUSION BRAIN TECHNIQUE: Multidetector CT imaging of the head and neck was performed using the standard protocol during bolus administration of intravenous contrast. Multiplanar CT image reconstructions and MIPs were obtained to evaluate the vascular anatomy. Carotid stenosis measurements (when applicable) are obtained utilizing NASCET criteria, using the distal internal carotid diameter as the denominator. Multiphase CT imaging of the brain was performed following IV bolus  contrast injection. Subsequent parametric perfusion maps were calculated using RAPID software. RADIATION DOSE REDUCTION: This exam was performed according to the departmental dose-optimization program which includes automated exposure control, adjustment of the mA and/or kV according to patient size and/or use of iterative reconstruction technique. CONTRAST:  OMNIPAQUE IOHEXOL 350 MG/ML SOLN COMPARISON:  Non-contrast head CT performed earlier today 10/08/2023. FINDINGS: CTA NECK FINDINGS Aortic arch: Standard aortic branching. The visualized thoracic aorta is normal in caliber. No hemodynamically significant innominate or proximal subclavian artery stenosis. Right carotid system: CCA and ICA patent within the neck without atherosclerotic stenosis. Mild atherosclerotic plaque about the carotid bifurcation and within the proximal ICA. A 50% narrowing of the proximal CCA appears to be related to vessel tortuosity rather than atherosclerotic plaque.  Tortuosity of the mid to distal cervical ICA. No evidence of dissection. Left carotid system: CCA and ICA patent within the neck without stenosis. Mild atherosclerotic plaque at the carotid bifurcation. No evidence of dissection. Vertebral arteries: Codominant and patent within the neck without stenosis. Skeleton: Levocurvature of the cervical spine. Partially imaged dextrocurvature of the mid/upper thoracic spine. Prior C5-C7 ACDF. Cervical spondylosis. No acute fracture or aggressive osseous lesion. Other neck: No neck mass or cervical lymphadenopathy. Upper chest: Motion degradation limits evaluation of the imaged lung apices. Atelectasis within the dependent aspect of the right upper lobe at the imaged levels. Review of the MIP images confirms the above findings CTA HEAD FINDINGS Anterior circulation: The intracranial internal carotid arteries are patent. Nonstenotic atherosclerotic plaque within the right ICA cavernous segment The M1 middle cerebral arteries are  patent. No M2 proximal branch occlusion or high-grade proximal stenosis. The anterior cerebral arteries are patent. No intracranial aneurysm is identified. Posterior circulation: The intracranial vertebral arteries are patent. The basilar artery is patent. The posterior cerebral arteries are patent. A small right posterior communicating artery is present. The left posterior communicating artery is diminutive or absent. Venous sinuses: Within the limitations of contrast timing, no convincing thrombus. Anatomic variants: As described. Review of the MIP images confirms the above findings CT Brain Perfusion Findings: Poor quality of the source data/contrast bolus limits reliability of the perfusion software estimates. Within this limitation, findings are as follows. CBF (<30%) Volume: 0mL Perfusion (Tmax>6.0s) volume: 30mL (scattered within the bilateral frontal and temporal lobes). Mismatch Volume: 30mL Infarction Location:None identified. No emergent large vessel occlusion identified. These results were called by telephone at the time of interpretation on 10/08/2023 at 10:10 am to provider ERIC [facility] , who verbally acknowledged these results. IMPRESSION: CTA neck: 1. The common carotid and internal carotid arteries are patent within the neck without neck significant atherosclerotic stenosis. Mild atherosclerotic plaque bilaterally, as described. 50% narrowing of the proximal right common carotid artery appears to be related to vessel tortuosity rather than atherosclerotic disease. 2. The vertebral arteries are patent within the neck without stenosis or significant atherosclerotic disease. CTA head: 1. No proximal intracranial large vessel occlusion or high-grade proximal arterial stenosis identified. 2. Non-stenotic atherosclerotic plaque within the right ICA cavernous segment. CT perfusion head: 1. Poor quality of the source data/contrast bolus limits reliability of the perfusion software estimates. Within this  limitation, findings are as follows. 2. The perfusion software identifies no core infarct. The perfusion software identifies regions of hypoperfused parenchyma, totaling 30 mL, scattered within the bilateral frontal and temporal lobes (utilizing the Tmax>6 seconds threshold). Reported mismatch volume: 30 mL. Electronically Signed   By: Jackey Loge D.O.   On: 10/08/2023 10:36   CT Head Wo Contrast Result Date: 10/08/2023 CLINICAL DATA:  Status epilepticus. EXAM: CT HEAD WITHOUT CONTRAST TECHNIQUE: Contiguous axial images were obtained from the base of the skull through the vertex without intravenous contrast. RADIATION DOSE REDUCTION: This exam was performed according to the departmental dose-optimization program which includes automated exposure control, adjustment of the mA and/or kV according to patient size and/or use of iterative reconstruction technique. COMPARISON:  09/29/2023 FINDINGS: Brain: No evidence of intracranial hemorrhage, acute infarction, hydrocephalus, extra-axial collection, or mass lesion/mass effect. Vascular:  No hyperdense vessel or other acute findings. Skull: No evidence of fracture or other significant bone abnormality. Sinuses/Orbits: No acute findings. Chronic opacification of left maxillary sinus again noted. Other: None. IMPRESSION: No acute intracranial abnormality. Chronic left maxillary sinus disease. Electronically Signed  By: Danae Orleans M.D.   On: 10/08/2023 08:15   Labs on Admission: I have personally reviewed following labs  CBC: Recent Labs  Lab 10/08/23 0722  WBC 18.9*  NEUTROABS 16.6*  HGB 11.1*  HCT 34.5*  MCV 92.5  PLT 423*   Basic Metabolic Panel: Recent Labs  Lab 10/02/23 0425 10/08/23 0722  NA 134* 134*  K 3.8 4.2  CL 97* 91*  CO2 28 27  GLUCOSE 113* 122*  BUN <5* 8  CREATININE 0.36* 0.56  CALCIUM 7.7* 8.1*  MG 1.8  --    GFR: Estimated Creatinine Clearance: 74.5 mL/min (by C-G formula based on SCr of 0.56 mg/dL).  Liver Function  Tests: Recent Labs  Lab 10/08/23 0722  AST 26  ALT 9  ALKPHOS 76  BILITOT 1.8*  PROT 6.6  ALBUMIN 1.9*   Recent Labs  Lab 10/08/23 0722  LIPASE 271*   Cardiac Enzymes: Recent Labs  Lab 10/08/23 0722  CKTOTAL 35*   CBG: Recent Labs  Lab 10/08/23 0729 10/08/23 0926 10/08/23 1247  GLUCAP 133* 123* 105*   Urine analysis:    Component Value Date/Time   COLORURINE YELLOW (A) 10/08/2023 1301   APPEARANCEUR CLEAR (A) 10/08/2023 1301   APPEARANCEUR Clear 05/24/2014 1903   LABSPEC >1.046 (H) 10/08/2023 1301   LABSPEC 1.015 05/24/2014 1903   PHURINE 6.0 10/08/2023 1301   GLUCOSEU NEGATIVE 10/08/2023 1301   GLUCOSEU Negative 05/24/2014 1903   HGBUR NEGATIVE 10/08/2023 1301   BILIRUBINUR NEGATIVE 10/08/2023 1301   BILIRUBINUR Negative 05/24/2014 1903   KETONESUR 20 (A) 10/08/2023 1301   PROTEINUR NEGATIVE 10/08/2023 1301   NITRITE NEGATIVE 10/08/2023 1301   LEUKOCYTESUR NEGATIVE 10/08/2023 1301   LEUKOCYTESUR Negative 05/24/2014 1903   CRITICAL CARE Performed by: Dr. Sedalia Muta  Total critical care time: 32 minutes  Critical care time was exclusive of separately billable procedures and treating other patients.  Critical care was necessary to treat or prevent imminent or life-threatening deterioration.  Critical care was time spent personally by me on the following activities: development of treatment plan with patient's mother as well as nursing, discussions with consultants, evaluation of patient's response to treatment, examination of patient, obtaining history from patient or surrogate, ordering and performing treatments and interventions, ordering and review of laboratory studies, ordering and review of radiographic studies, pulse oximetry and re-evaluation of patient's condition.  This document was prepared using Dragon Voice Recognition software and may include unintentional dictation errors.  Dr. Sedalia Muta [facility]  If 7PM-7AM, please contact  overnight-coverage provider If 7AM-7PM, please contact day attending provider [email or web address]  10/08/2023, 3:41 PM

## 2023-10-08 NOTE — Assessment & Plan Note (Addendum)
In setting of increased opacity in the right lower lobe, leukocytosis Unasyn per pharmacy ordered on admission Oxygen therapy with nasal cannula as needed to maintain SpO2 greater than 92% Continuous pulse oximetry

## 2023-10-08 NOTE — Hospital Course (Addendum)
 [name] is a 58 year old female with history of giant cell arteritis, seizure with status epilepticus, chronic pain, who presents emergency department from home for chief concerns of seizure.  Patient was discharged from the hospital on 10/04/2023, and reportedly has not been taking her prescribed seizure medications.  Vitals in the ED showed temperature of 98.9, respiration rate 16, heart rate 117, blood pressure 121/69, SpO2 of 93% on 3 L nasal cannula.  Serum sodium is 134, potassium 4.2, chloride 91, bicarb 27, BUN of 8, serum creatinine 0.56, EGFR greater than 60, nonfasting blood glucose 122, WBC 18.9, hemoglobin 11.1, platelets of 423.  CK is 35. COVID/influenza A/influenza B/RSV PCR were negative.  ED treatment: Patient was loaded with Keppra and neurology was consulted.  Neurology specialists recommended: Stat EEG, continue Keppra 1500 mg IV twice daily, carbamazepine 100 mg p.o. twice daily (may need NGT for administration), check carbamazepine level, and a stat CTA head and neck with CTP to rule out LVO.  CTA head neck: Read as no proximal intracranial large vessel occlusion or high-grade proximal artery stenosis identified.  2/19: Vital stable, UDS positive for opioids and benzos, labs with potassium of 3.1, improving leukocytosis at 15.2 but all cell line decreased.  Carbamazepine levels less than 2, phenytoin levels less than 2.5 and Keppra levels pending. This is consistent with being noncompliant with her seizure medications resulted in breakthrough seizures. Phenytoin was apparently discontinued previously. Neurology is recommending Keppra 1500 mg twice daily, carbamazepine 100 mg twice daily and Neurontin at 300 mg 4 times daily. Patient will need meds to bed for discharge.  2/20: Patient seems very depressed and keeps saying that he wants to die so psych evaluation was ordered.  Denies any suicidal thoughts but just very frustrated.  Asking to resume home pain  medications as pain interfere with all the activities.  Home p.o. morphine was restarted.  PT is recommending SNF  2/21: Still feeling depressed and lethargic, did not qualify for inpatient psych treatment.  Remeron was added to help with appetite and depression.  Psych is recommending increasing the dose of Cymbalta to 90, mild hyponatremia today, hypokalemia has been resolved.  Morphine switched with low-dose oxycodone at her request stating that morphine is not very helpful, decreasing the frequency of gabapentin as she appears quite sedated.  Also added lidocaine patch to help with backache so we can avoid whole lot of opioids or sedating medications. Diarrhea has been resolved so no GI pathogen panel collected In the presence of psych she made herself full code-CODE STATUS changed.  2/22: Patient still appears depressed and lethargic.  Keeps saying that oxycodone is also not helping.  I explained that we do not want to increase sedating medications as we would like to get her out of bed and participate with therapy.  Awaiting SNF placement.  2/24: Hemodynamically remained stable-awaiting SNF Need to be encouraged out of bed.

## 2023-10-08 NOTE — Progress Notes (Signed)
 Eeg done

## 2023-10-08 NOTE — Consult Note (Signed)
NEUROLOGY CONSULT NOTE   Date of service: October 08, 2023 Patient Name: Abigail [name] [facility] MRN:  [id number] DOB:  04/13/1966 Chief Complaint: Status epilepticus Requesting Provider: Sharyn Creamer, MD  History of Present Illness  Abigail [name] is a 58 y.o. female with a PMHx of anxiety, arthritis, giant cell arteritis off steroid since May 2024, Back pain, Basal cell carcinoma, COPD, chronic hypoxic respiratory failure on 2 L O2 continuously, recurrent pancreatitis, Fibromyalgia, HLD, IBS, Infectious colitis, Migraines, narcotic dependence with opioid overdose (12/07/2022) and seizures who presents to the [facility] ED after being found seizing by her son at home at about 5 AM this morning. It was unknown for how long she had been seizing, but she was continuously seizing when son found her. He administered her nasal anticonvulsant but she continued to seize and after 30 minutes he called EMS. On their arrival she was exhibiting generalized seizure activity and 2 mg IM Versed was administered, followed by another 2 mg IV, after which clinical seizure activity stopped. Glucose was in the normal range per EMS. She was densely postictal on arrival to the ED, not moving any of her extremities but able to answer some simple questions.    Additional history was obtained from son by EDP: "Obtained additional history from the patient's son Abigail [name]. She left the hospital just a few days ago and is not believed to be taking Vimpat or phenytoin any longer. She was supposed to start carbamazepine but the pharmacy did not yet have it supposed to be ready today. She is believed to be taking levetiracetam "  Neurology attempted to call the son for more information regarding LKN and her seizure medications, but despite 4 attempts with 2 separate telephone numbers, there was no answer and voicemail was full.   She was discharged from the hospital on 2/14 after management of abdominal pain. During that  admission she had intermittent acute metabolic encephalopathy in the setting of opioids for pain. Of note, she had intermittently been refusing her Vimpat and phenytoin anticonvulsant medications during that admission. Vimpat was discontinued last admission due to patient refusing to take this medication as well as her becoming very sedated for a day after one of the doses that she did take. Her outpatient Neurologist had been tapering her off Vimpat with plan to fully discontinue in one month. She was also prescribed carbamazepine 100 mg BID by her outpatient Neurologist (Dr. Sherryll Burger of [facility]) which was continued during the last admission.  EEG on 2/10 showed diffuse slowing with no evidence of seizure activity.   Discharge AEDs on 2/14 were as follows: - Carbamazepine 100 mg BID - Keppra 1500 mg BID - Gabapentin 300 mg qid  EEG from 09/30/23: This EEG was obtained while awake and asleep and is abnormal due to: - Moderate diffuse slowing indicative of global cerebral dysfunction - Triphasic waves most commonly seen in the setting of metabolic derangement.  - There were no definitive epileptiform abnormalities were not seen during this recording.  - This EEG is unchanged from her most recent EEG 07/12/23.    ROS  Unable to ascertain due to AMS.   Past History   Past Medical History:  Diagnosis Date   Anxiety    Arthritis    Back pain    Basal cell carcinoma 10/04/2021   R axilla - needs excised 11/28/21   Basal cell carcinoma 10/04/2021   L antecubital excised 11/14/21   Diarrhea 11/12/2016   Fibromyalgia  Generalized abdominal pain 11/12/2016   H. pylori infection    Hyperlipidemia    IBS (irritable bowel syndrome)    Infectious colitis 04/29/2016   Migraines    Moderate dehydration 04/29/2016   Muscle pain    Opioid overdose (HCC) 12/07/2022   Reflux    Seizures (HCC)    Unexplained weight loss 11/12/2016    Past Surgical History:  Procedure Laterality Date    ABDOMINAL HYSTERECTOMY     APPENDECTOMY  2009   C5 FUSION     C6 FUSION     C7 FUSION     COLONOSCOPY  02/2006   COLONOSCOPY WITH PROPOFOL N/A 12/25/2016   Procedure: COLONOSCOPY WITH PROPOFOL;  Surgeon: Earline Mayotte, MD;  Location: [facility] ENDOSCOPY;  Service: Endoscopy;  Laterality: N/A;   ESOPHAGOGASTRODUODENOSCOPY (EGD) WITH PROPOFOL N/A 12/25/2016   Procedure: ESOPHAGOGASTRODUODENOSCOPY (EGD) WITH PROPOFOL;  Surgeon: Earline Mayotte, MD;  Location: [facility] ENDOSCOPY;  Service: Endoscopy;  Laterality: N/A;   FOOT SURGERY     HIP ARTHROPLASTY     L4 FUSION     L5 FUSION     S1 FUSION      Family History: History reviewed. No pertinent family history.  Social History  reports that she has been smoking cigarettes. She has never used smokeless tobacco. She reports current alcohol use. She reports that she does not use drugs.  Allergies  Allergen Reactions   Nsaids Hives   Tapentadol Swelling, Rash and Other (See Comments)    Nucynta- Made her deathly sick   Cephalexin Other (See Comments)    Reaction not cited   Codeine Other (See Comments)    Reaction not cited   Darvocet [Propoxyphene N-Acetaminophen] Other (See Comments)    Reaction not cited   Latex Other (See Comments)    Reaction not cited   Silicone Other (See Comments)    Reaction not cited   Sulfa Antibiotics Hives   Tape Other (See Comments)    Reaction not cited   Meloxicam Rash    Medications   Current Facility-Administered Medications:    levETIRAcetam (KEPPRA) 3,000 mg in sodium chloride 0.9 % 250 mL IVPB, 3,000 mg, Intravenous, Once, Sharyn Creamer, MD  Current Outpatient Medications:    bisacodyl (DULCOLAX) 5 MG EC tablet, Take 1 tablet (5 mg total) by mouth daily as needed for moderate constipation., Disp: 30 tablet, Rfl: 0   carbamazepine (TEGRETOL XR) 100 MG 12 hr tablet, Take 1 tablet (100 mg total) by mouth 2 (two) times daily., Disp: 60 tablet, Rfl: 0   DULoxetine (CYMBALTA) 60 MG capsule, Take 1  capsule (60 mg total) by mouth daily., Disp: 30 capsule, Rfl: 0   gabapentin (NEURONTIN) 300 MG capsule, Take 1 capsule (300 mg total) by mouth 4 (four) times daily., Disp: 120 capsule, Rfl: 0   levETIRAcetam (KEPPRA) 750 MG tablet, Take 2 tablets (1,500 mg total) by mouth 2 (two) times daily., Disp: 120 tablet, Rfl: 0   Melatonin 10 MG TABS, Take 10 mg by mouth at bedtime., Disp: , Rfl:    mirtazapine (REMERON) 7.5 MG tablet, Take 1 tablet (7.5 mg total) by mouth at bedtime., Disp: 30 tablet, Rfl: 0   modafinil (PROVIGIL) 200 MG tablet, Take 200 mg by mouth daily., Disp: , Rfl:    morphine (MSIR) 15 MG tablet, Take by mouth., Disp: , Rfl:    nicotine (NICODERM CQ - DOSED IN MG/24 HOURS) 14 mg/24hr patch, One patch chest wall daily (okay to substitute generic), Disp: 28  patch, Rfl: 0   NON FORMULARY, Apply 1 Application topically 3 (three) times daily as needed. Rock Salt Cream (Similar to [employer]), Disp: , Rfl:    ondansetron (ZOFRAN-ODT) 8 MG disintegrating tablet, Take 1 tablet (8 mg total) by mouth every 8 (eight) hours as needed for nausea., Disp: 20 tablet, Rfl: 1   polyethylene glycol powder (GLYCOLAX/MIRALAX) 17 GM/SCOOP powder, Mix 17 g with a beverage and drink once daily., Disp: 238 g, Rfl: 0   senna-docusate (SENOKOT-S) 8.6-50 MG tablet, Take 1 tablet by mouth 2 (two) times daily., Disp: 60 tablet, Rfl: 0  Vitals   Vitals:   10/08/23 0717 10/08/23 0719 10/08/23 0720  BP: 121/69    Pulse: (!) 117    Resp: 16    Temp:   98.8 F (37.1 C)  TempSrc:   Axillary  SpO2: 93%    Weight:  68 kg   Height:  5\' 7"  (1.702 m)     Body mass index is 23.48 kg/m.  Physical Exam   Physical Exam HEENT- [place]/AT. No nuchal rigidity.  Lungs- Respirations unlabored Extremities- Warm and well-perfused  Neurological Examination Mental Status: Lethargic. Can be awakened to a somnolent state. Will follow simple motor commands on the right, but unable to move her left side. Speech is slow, monotone  and dysarthric with only 1-2 word answers to questions. Cannot correctly count fingers. Does not make eye contact.  Cranial Nerves: II: Can demonstrate ability to visually identify one object. Unreliable bllink to threat. PERRL. III,IV, VI: Bilateral ptosis. Eyes are conjugate at the midline. Can move eyes to the right slowly but does not bury sclerae. Cannot cross midline to the left. No nystagmus. V: Unable to formally assess due to decreased level of alertness.  VII: Does not grimace or smile to command VIII: Hearing intact to voice IX,X: No hypophonia or hoarseness XI: Head is midline XII: Not following command Motor: RUE: 4/5 with slow movements LUE: Flaccid tone with no movement proximally or distally to command or noxious RLE: Withdraws to noxious plantar stimulation but does not elevate antigravity LLE: Trace/flicker of movement to noxious plantar stimulation Sensory: FT intact to RUE and RLE.  Intact pain sensation to LUE and LLE but does not reliably respond to fine touch or pressure stimuli.  Deep Tendon Reflexes: 2+ and symmetric bilateral biceps, brachioradialis and patellae Cerebellar: Unable to follow commands for testing Gait: Unable to assess Other: No jerking, twitching, posturing or other seizure-like activity noted.    Labs/Imaging/Neurodiagnostic studies   CBC: No results for input(s): "WBC", "NEUTROABS", "HGB", "HCT", "MCV", "PLT" in the last 168 hours. Basic Metabolic Panel:  Lab Results  Component Value Date   NA 134 (L) 10/08/2023   K 4.2 10/08/2023   CO2 27 10/08/2023   GLUCOSE 122 (H) 10/08/2023   BUN 8 10/08/2023   CREATININE 0.56 10/08/2023   CALCIUM 8.1 (L) 10/08/2023   GFRNONAA >60 10/08/2023   GFRAA >60 01/15/2015   Lipid Panel:  Lab Results  Component Value Date   LDLCALC 100 (H) 09/30/2023   HgbA1c:  Lab Results  Component Value Date   HGBA1C 5.5 12/24/2022   Urine Drug Screen:     Component Value Date/Time   LABOPIA POSITIVE (A)  07/13/2023 0037   COCAINSCRNUR NONE DETECTED 07/13/2023 0037   LABBENZ POSITIVE (A) 07/13/2023 0037   AMPHETMU NONE DETECTED 07/13/2023 0037   THCU POSITIVE (A) 07/13/2023 0037   LABBARB NONE DETECTED 07/13/2023 0037    Alcohol Level  Component Value Date/Time   ETH <10 07/12/2023 0723   INR  Lab Results  Component Value Date   INR 1.2 07/29/2023   APTT  Lab Results  Component Value Date   APTT 37 (H) 07/29/2023   AED levels:  Lab Results  Component Value Date   PHENYTOIN <2.5 (L) 07/12/2023   LEVETIRACETA 68.5 (H) 07/12/2023     ASSESSMENT  58 y.o. female with a PMHx of anxiety, arthritis, giant cell arteritis off steroid since May 2024, Back pain, Basal cell carcinoma, COPD, chronic hypoxic respiratory failure on 2 L O2 continuously, recurrent pancreatitis, Fibromyalgia, HLD, IBS, Infectious colitis, Migraines, narcotic dependence with opioid overdose (12/07/2022) and seizures who presents to [facility] ED after being found seizing by her son at home at about 5 AM this morning. It was unknown for how long she had been seizing, but she was continuously seizing when son found her. He administered her nasal anticonvulsant but she continued to seize and after 30 minutes he called EMS. On their arrival she was exhibiting generalized seizure activity and 2 mg IM Versed was administered, followed by another 2 mg IV, after which clinical seizure activity stopped. Glucose was in the normal range per EMS. She was densely postictal on arrival to the ED, not moving any of her extremities but able to answer some simple questions.   - She was loaded with 3000 mg IV Keppra after arrival to the ED.  - Exam reveals a lethargic female who responds to her name, can state that she is in the hospital and follow right sided simple motor commands, but is unable to gaze to the left, with flaccid LUE and LLE, but intact sensation to pain on the left.  -- CT head: No evidence of intracranial hemorrhage,  acute infarction, hydrocephalus, extra-axial collection, or mass lesion/mass effect. No hyperdense vessel or other acute findings. No evidence of fracture or other significant bone abnormality. - Labs: - BUN 8, Cr 0.56, eGFR > 60 - Na 134 - K 4.2 - Ca low at 8.1 in the context of low albumin of 1.9 - AST and ALT are normal. Total bilirubin elevated at 1.8 - CK elevated at 35 - Lipase elevated at 271 - WBC 18.9  - Phenytoin < 2.5 - Glucose 122 - Overall impression:  - Status epilepticus, clinically now resolved. Will need to rule out possible subclinical seizure activity with STAT EEG - Left hemiplegia: DDx includes Tawanna Cooler' paralysis and acute stroke. Son unavailable on two attempts to call at 9:20-9:25 AM with two separate numbers, in order to determine LKW. Obtaining STAT CTA with CTP.   RECOMMENDATIONS  - STAT EEG (ordered) - Continue Keppra at 1500 mg IV BID.  - Continue carbamazepine at 100 mg po BID. May need NGT for administration.  - Carbamazepine level (ordered) - Already loaded with Keppra. Drawing a level will not be informative.  - STAT CTA of head and neck with CTP to rule out LVO (ordered).   Addendum: CTA neck:  1. The common carotid and internal carotid arteries are patent within the neck without significant atherosclerotic stenosis. Mild atherosclerotic plaque bilaterally, as described. 50% narrowing of the proximal right common carotid artery appears to be related to vessel tortuosity rather than atherosclerotic disease. 2. The vertebral arteries are patent within the neck without stenosis or significant atherosclerotic disease.   CTA head:  1. No proximal intracranial large vessel occlusion or high-grade proximal arterial stenosis identified. 2. Non-stenotic atherosclerotic plaque within the right ICA  cavernous segment.   CT perfusion head:  1. Poor quality of the source data/contrast bolus limits reliability of the perfusion software estimates. Within this  limitation, findings are as follows. 2. The perfusion software identifies no core infarct. The perfusion software identifies regions of hypoperfused parenchyma, totaling 30 mL, scattered within the bilateral frontal and temporal lobes (utilizing the Tmax>6 seconds threshold). Reported mismatch volume: 30 mL.  ______________________________________________________________________    Dessa Phi, Younis Mathey, MD [facility]

## 2023-10-08 NOTE — Assessment & Plan Note (Signed)
Home duloxetine 60 mg daily resumed

## 2023-10-08 NOTE — Assessment & Plan Note (Signed)
Breakthrough seizure likely progressed to status epilepticus. With reported status epilepticus Seizure precaution, fall precaution, aspiration precaution Continue Keppra 1500 mg IV twice daily and carbamazepine 100 mg p.o. twice daily as recommended by neurologist Carbamazepine level is subtherapeutic Likely breakthrough seizure due to being noncompliant Mental status with significant improvement although appears depressed.

## 2023-10-08 NOTE — ED Notes (Signed)
Called CCMD at [phone] to initiate cardiac monitoring.

## 2023-10-08 NOTE — ED Notes (Signed)
Pt complaining of RLQ pain as well as back pain. Dr Fanny Bien informed

## 2023-10-08 NOTE — ED Provider Notes (Signed)
Cxr final report   DG Chest Portable 1 View Result Date: 10/08/2023 CLINICAL DATA:  eval for aspiration Seizure. EXAM: PORTABLE CHEST 1 VIEW COMPARISON:  Radiograph 07/14/2023, CT 07/15/2023 FINDINGS: Low lung volumes and anti lordotic positioning limit assessment. Bibasilar volume loss with streaky bibasilar opacities, favor atelectasis. Stable heart size and mediastinal contours allowing for differences in technique. No pneumothorax. Difficult to assess for effusion on the current exam. IMPRESSION: Very low lung volumes limiting assessment. Streaky bibasilar opacities are similar to prior exam, favor atelectasis. Electronically Signed   By: Narda Rutherford M.D.   On: 10/08/2023 15:25   EEG adult Result Date: 10/08/2023 Abigail Quest, MD     10/08/2023 12:19 PM Patient Name: Abigail [name] Abigail [name] MRN: [id number] Epilepsy Attending: Charlsie [name] Referring Physician/Provider: Caryl Pina, MD Date: 10/08/2023 Duration: 34.42 mins Patient history:  58 yo F brought in after being found seizing by her son at home at about 5 AM this morning. EEG to evaluate for seizure Level of alertness: Awake, asleep AEDs during EEG study: LEV, CBZ Technical aspects: This EEG study was done with scalp electrodes positioned according to the 10-20 International system of electrode placement. Electrical activity was reviewed with band pass filter of 1-70Hz , sensitivity of 7 uV/mm, display speed of 31mm/sec with a 60Hz  notched filter applied as appropriate. EEG data were recorded continuously and digitally stored.  Video monitoring was available and reviewed as appropriate. Description: The posterior dominant rhythm consists of 7 Hz activity of moderate voltage (25-35 uV) seen predominantly in posterior head regions, symmetric and reactive to eye opening and eye closing. Sleep was characterized by vertex waves, sleep spindles (12 to 14 Hz), maximal frontocentral region. EEG showed continuous generalized polymorphic sharply  contoured 5 to 7 Hz theta slowing. Hyperventilation and photic stimulation were not performed.   ABNORMALITY - Continuous slow, generalized IMPRESSION: This study is suggestive of mild to moderate diffuse encephalopathy. No seizures or epileptiform discharges were seen throughout the recording. Abigail [name]   CT ANGIO HEAD NECK W WO CM W PERF Result Date: 10/08/2023 CLINICAL DATA:  Provided history: Neuro deficit, acute, stroke suspected. Additional history provided: Seizure with subsequent dense left hemiparesis. EXAM: CT ANGIOGRAPHY HEAD AND NECK CT PERFUSION BRAIN TECHNIQUE: Multidetector CT imaging of the head and neck was performed using the standard protocol during bolus administration of intravenous contrast. Multiplanar CT image reconstructions and MIPs were obtained to evaluate the vascular anatomy. Carotid stenosis measurements (when applicable) are obtained utilizing NASCET criteria, using the distal internal carotid diameter as the denominator. Multiphase CT imaging of the brain was performed following IV bolus contrast injection. Subsequent parametric perfusion maps were calculated using RAPID software. RADIATION DOSE REDUCTION: This exam was performed according to the departmental dose-optimization program which includes automated exposure control, adjustment of the mA and/or kV according to patient size and/or use of iterative reconstruction technique. CONTRAST:  OMNIPAQUE IOHEXOL 350 MG/ML SOLN COMPARISON:  Non-contrast head CT performed earlier today 10/08/2023. FINDINGS: CTA NECK FINDINGS Aortic arch: Standard aortic branching. The visualized thoracic aorta is normal in caliber. No hemodynamically significant innominate or proximal subclavian artery stenosis. Right carotid system: CCA and ICA patent within the neck without atherosclerotic stenosis. Mild atherosclerotic plaque about the carotid bifurcation and within the proximal ICA. A 50% narrowing of the proximal CCA appears to be  related to vessel tortuosity rather than atherosclerotic plaque. Tortuosity of the mid to distal cervical ICA. No evidence of dissection. Left carotid system: CCA and ICA patent  within the neck without stenosis. Mild atherosclerotic plaque at the carotid bifurcation. No evidence of dissection. Vertebral arteries: Codominant and patent within the neck without stenosis. Skeleton: Levocurvature of the cervical spine. Partially imaged dextrocurvature of the mid/upper thoracic spine. Prior C5-C7 ACDF. Cervical spondylosis. No acute fracture or aggressive osseous lesion. Other neck: No neck mass or cervical lymphadenopathy. Upper chest: Motion degradation limits evaluation of the imaged lung apices. Atelectasis within the dependent aspect of the right upper lobe at the imaged levels. Review of the MIP images confirms the above findings CTA HEAD FINDINGS Anterior circulation: The intracranial internal carotid arteries are patent. Nonstenotic atherosclerotic plaque within the right ICA cavernous segment The M1 middle cerebral arteries are patent. No M2 proximal branch occlusion or high-grade proximal stenosis. The anterior cerebral arteries are patent. No intracranial aneurysm is identified. Posterior circulation: The intracranial vertebral arteries are patent. The basilar artery is patent. The posterior cerebral arteries are patent. A small right posterior communicating artery is present. The left posterior communicating artery is diminutive or absent. Venous sinuses: Within the limitations of contrast timing, no convincing thrombus. Anatomic variants: As described. Review of the MIP images confirms the above findings CT Brain Perfusion Findings: Poor quality of the source data/contrast bolus limits reliability of the perfusion software estimates. Within this limitation, findings are as follows. CBF (<30%) Volume: 0mL Perfusion (Tmax>6.0s) volume: 30mL (scattered within the bilateral frontal and temporal lobes). Mismatch  Volume: 30mL Infarction Location:None identified. No emergent large vessel occlusion identified. These results were called by telephone at the time of interpretation on 10/08/2023 at 10:10 am to provider [facility] , who verbally acknowledged these results. IMPRESSION: CTA neck: 1. The common carotid and internal carotid arteries are patent within the neck without neck significant atherosclerotic stenosis. Mild atherosclerotic plaque bilaterally, as described. 50% narrowing of the proximal right common carotid artery appears to be related to vessel tortuosity rather than atherosclerotic disease. 2. The vertebral arteries are patent within the neck without stenosis or significant atherosclerotic disease. CTA head: 1. No proximal intracranial large vessel occlusion or high-grade proximal arterial stenosis identified. 2. Non-stenotic atherosclerotic plaque within the right ICA cavernous segment. CT perfusion head: 1. Poor quality of the source data/contrast bolus limits reliability of the perfusion software estimates. Within this limitation, findings are as follows. 2. The perfusion software identifies no core infarct. The perfusion software identifies regions of hypoperfused parenchyma, totaling 30 mL, scattered within the bilateral frontal and temporal lobes (utilizing the Tmax>6 seconds threshold). Reported mismatch volume: 30 mL. Electronically Signed   By: Jackey Loge D.O.   On: 10/08/2023 10:36   CT Head Wo Contrast Result Date: 10/08/2023 CLINICAL DATA:  Status epilepticus. EXAM: CT HEAD WITHOUT CONTRAST TECHNIQUE: Contiguous axial images were obtained from the base of the skull through the vertex without intravenous contrast. RADIATION DOSE REDUCTION: This exam was performed according to the departmental dose-optimization program which includes automated exposure control, adjustment of the mA and/or kV according to patient size and/or use of iterative reconstruction technique. COMPARISON:  09/29/2023  FINDINGS: Brain: No evidence of intracranial hemorrhage, acute infarction, hydrocephalus, extra-axial collection, or mass lesion/mass effect. Vascular:  No hyperdense vessel or other acute findings. Skull: No evidence of fracture or other significant bone abnormality. Sinuses/Orbits: No acute findings. Chronic opacification of left maxillary sinus again noted. Other: None. IMPRESSION: No acute intracranial abnormality. Chronic left maxillary sinus disease. Electronically Signed   By: Danae Orleans M.D.   On: 10/08/2023 08:15      Sharyn Creamer,  MD 10/08/23 1534

## 2023-10-08 NOTE — Assessment & Plan Note (Signed)
PDMP reviewed Patient currently has active prescription for gabapentin 300 mg, 120 capsules, 30-day supply that was written and filled on 10/04/2023 Lacosamide 200 mg tablet, 60 tablet, 30-day supply filled on 09/10/2023.

## 2023-10-09 ENCOUNTER — Other Ambulatory Visit: Payer: Self-pay

## 2023-10-09 ENCOUNTER — Encounter: Payer: Self-pay | Admitting: Internal Medicine

## 2023-10-09 DIAGNOSIS — M316 Other giant cell arteritis: Secondary | ICD-10-CM

## 2023-10-09 DIAGNOSIS — Z87898 Personal history of other specified conditions: Secondary | ICD-10-CM | POA: Diagnosis not present

## 2023-10-09 DIAGNOSIS — R6510 Systemic inflammatory response syndrome (SIRS) of non-infectious origin without acute organ dysfunction: Secondary | ICD-10-CM

## 2023-10-09 DIAGNOSIS — R569 Unspecified convulsions: Secondary | ICD-10-CM | POA: Diagnosis not present

## 2023-10-09 DIAGNOSIS — K219 Gastro-esophageal reflux disease without esophagitis: Secondary | ICD-10-CM

## 2023-10-09 DIAGNOSIS — F32A Depression, unspecified: Secondary | ICD-10-CM

## 2023-10-09 DIAGNOSIS — G8929 Other chronic pain: Secondary | ICD-10-CM

## 2023-10-09 DIAGNOSIS — E43 Unspecified severe protein-calorie malnutrition: Secondary | ICD-10-CM

## 2023-10-09 DIAGNOSIS — J690 Pneumonitis due to inhalation of food and vomit: Secondary | ICD-10-CM

## 2023-10-09 DIAGNOSIS — F17200 Nicotine dependence, unspecified, uncomplicated: Secondary | ICD-10-CM

## 2023-10-09 LAB — CBC
HCT: 31.2 % — ABNORMAL LOW (ref 36.0–46.0)
Hemoglobin: 10.2 g/dL — ABNORMAL LOW (ref 12.0–15.0)
MCH: 29.9 pg (ref 26.0–34.0)
MCHC: 32.7 g/dL (ref 30.0–36.0)
MCV: 91.5 fL (ref 80.0–100.0)
Platelets: 355 10*3/uL (ref 150–400)
RBC: 3.41 MIL/uL — ABNORMAL LOW (ref 3.87–5.11)
RDW: 15.2 % (ref 11.5–15.5)
WBC: 15.2 10*3/uL — ABNORMAL HIGH (ref 4.0–10.5)
nRBC: 0 % (ref 0.0–0.2)

## 2023-10-09 LAB — LEVETIRACETAM LEVEL: Levetiracetam Lvl: <2 ug/mL — ABNORMAL LOW (ref 10.0–40.0)

## 2023-10-09 LAB — BASIC METABOLIC PANEL
Anion gap: 14 (ref 5–15)
BUN: 8 mg/dL (ref 6–20)
CO2: 27 mmol/L (ref 22–32)
Calcium: 8.1 mg/dL — ABNORMAL LOW (ref 8.9–10.3)
Chloride: 97 mmol/L — ABNORMAL LOW (ref 98–111)
Creatinine, Ser: 0.38 mg/dL — ABNORMAL LOW (ref 0.44–1.00)
GFR, Estimated: >60 mL/min (ref >60)
Glucose, Bld: 122 mg/dL — ABNORMAL HIGH (ref 70–99)
Potassium: 3.1 mmol/L — ABNORMAL LOW (ref 3.5–5.1)
Sodium: 138 mmol/L (ref 135–145)

## 2023-10-09 LAB — CBG MONITORING, ED: Glucose-Capillary: 103 mg/dL — ABNORMAL HIGH (ref 70–99)

## 2023-10-09 MED ORDER — CARBAMAZEPINE 100 MG PO CHEW
100.0000 mg | CHEWABLE_TABLET | Freq: Two times a day (BID) | ORAL | Status: DC
  Administered 2023-10-09 – 2023-10-16 (×14): 100 mg via ORAL
  Filled 2023-10-09 (×14): qty 1

## 2023-10-09 NOTE — ED Notes (Signed)
 Assumed patient care and received report from the previous nurse.

## 2023-10-09 NOTE — Evaluation (Signed)
Clinical/Bedside Swallow Evaluation Patient Details  Name: Abigail [name] [facility] MRN: [id number] Date of Birth: [birth year]  Today's Date: 10/09/2023 Time: SLP Start Time (ACUTE ONLY): 0843 SLP Stop Time (ACUTE ONLY): 0858 SLP Time Calculation (min) (ACUTE ONLY): 15 min  Past Medical History:  Past Medical History:  Diagnosis Date   Anxiety    Arthritis    Back pain    Basal cell carcinoma 10/04/2021   R axilla - needs excised 11/28/21   Basal cell carcinoma 10/04/2021   L antecubital excised 11/14/21   Diarrhea 11/12/2016   Fibromyalgia    Generalized abdominal pain 11/12/2016   H. pylori infection    Hyperlipidemia    IBS (irritable bowel syndrome)    Infectious colitis 04/29/2016   Migraines    Moderate dehydration 04/29/2016   Muscle pain    Opioid overdose (HCC) 12/07/2022   Reflux    Seizures (HCC)    Unexplained weight loss 11/12/2016   Past Surgical History:  Past Surgical History:  Procedure Laterality Date   ABDOMINAL HYSTERECTOMY     APPENDECTOMY  2009   C5 FUSION     C6 FUSION     C7 FUSION     COLONOSCOPY  02/2006   COLONOSCOPY WITH PROPOFOL N/A 12/25/2016   Procedure: COLONOSCOPY WITH PROPOFOL;  Surgeon: Earline Mayotte, MD;  Location: [facility] ENDOSCOPY;  Service: Endoscopy;  Laterality: N/A;   ESOPHAGOGASTRODUODENOSCOPY (EGD) WITH PROPOFOL N/A 12/25/2016   Procedure: ESOPHAGOGASTRODUODENOSCOPY (EGD) WITH PROPOFOL;  Surgeon: Earline Mayotte, MD;  Location: [facility] ENDOSCOPY;  Service: Endoscopy;  Laterality: N/A;   FOOT SURGERY     HIP ARTHROPLASTY     L4 FUSION     L5 FUSION     S1 FUSION     HPI:  Pt is a "58 year old female with history of giant cell arteritis, seizure with status epilepticus, chronic pain, who presents emergency department from home for chief concerns of seizure.     Patient was discharged from the hospital on 10/04/2023, and reportedly has not been taking her prescribed seizure medications." (per H&P). CXR, 10/09/23, "Very low lung  volumes limiting assessment. Streaky bibasilar  opacities are similar to prior exam, favor atelectasis."    Assessment / Plan / Recommendation  Clinical Impression  Pt seen for clinical swallowing evaluation. Pt alert. Slow to respond, flat affect. Hypophonia vs reduced effort. Strong cough. Unremarkable OME with exception of missing dentition. On 2L/min O2 via [place]. Pt demonstrated s/sx mild oral dysphagia c/b prolonged mastication and seemingly prolonged A-P transit with solid. No overt s/sx concerning for pharyngeal dysphagia. Recommend initiation of a mech soft diet with thin liquids. Standard aspiration precautions, reflux precautions. Meds as tolerated, including use of carrier for ease of swallowing. Set up assistance and feeding assistance PRN given cognition and overall weakness/debility. SLP to f/u per POC for diet tolerance. SLP Visit Diagnosis: Dysphagia, oral phase (R13.11)    Aspiration Risk  Mild aspiration risk    Diet Recommendation Dysphagia 3 (Mech soft);Thin liquid    Liquid Administration via: Spoon;Cup;Straw Medication Administration:  (as tolerated) Supervision: Patient able to self feed;Staff to assist with self feeding Compensations: Minimize environmental distractions;Slow rate;Small sips/bites Postural Changes: Seated upright at 90 degrees (reflux precautions)    Other  Recommendations Oral Care Recommendations: Oral care BID;Staff/trained caregiver to provide oral care    Recommendations for follow up therapy are one component of a multi-disciplinary discharge planning process, led by the attending physician.  Recommendations may be updated based on patient status,  additional functional criteria and insurance authorization.  Follow up Recommendations  (likely no ST needs at d/c)         Functional Status Assessment Patient has had a recent decline in their functional status and demonstrates the ability to make significant improvements in function in a reasonable  and predictable amount of time.  Frequency and Duration min 2x/week  1 week       Prognosis Prognosis for improved oropharyngeal function: Good Barriers to Reach Goals: Behavior (AMS; comorbidities)      Swallow Study   General Date of Onset: 10/08/23 HPI: Pt is a "58 year old female with history of giant cell arteritis, seizure with status epilepticus, chronic pain, who presents emergency department from home for chief concerns of seizure.     Patient was discharged from the hospital on 10/04/2023, and reportedly has not been taking her prescribed seizure medications." (per H&P). CXR, 10/09/23, "Very low lung volumes limiting assessment. Streaky bibasilar  opacities are similar to prior exam, favor atelectasis." Type of Study: Bedside Swallow Evaluation Previous Swallow Assessment: CSE 07/03/23 - recommended a full liquid diet, thin liquids Diet Prior to this Study: NPO Temperature Spikes Noted: Yes (low grade temp; WBC 15.2 which is decreased from yesterday) Respiratory Status: Nasal cannula (2L/min via [place]) History of Recent Intubation: No Behavior/Cognition: Alert;Cooperative (flat affec) Oral Cavity Assessment: Within Functional Limits Oral Care Completed by SLP: Yes Oral Cavity - Dentition:  (some missing teeth) Vision: Functional for self-feeding Self-Feeding Abilities: Able to feed self Patient Positioning: Upright in bed Baseline Vocal Quality: Normal;Low vocal intensity (low intensity vs effort) Volitional Cough: Strong Volitional Swallow: Able to elicit    Oral/Motor/Sensory Function Overall Oral Motor/Sensory Function: Within functional limits   Ice Chips Ice chips: Not tested   Thin Liquid Thin Liquid: Within functional limits Presentation: Straw Other Comments: 3 oz water challenge    Nectar Thick Nectar Thick Liquid: Not tested   Honey Thick Honey Thick Liquid: Not tested   Puree Puree: Impaired Presentation: Self Fed   Solid     Solid: Impaired Presentation:  Self Fed Oral Phase Impairments: Impaired mastication Oral Phase Functional Implications: Impaired mastication;Prolonged oral transit     Clyde Canterbury, M.S., CCC-SLP Speech-Language Pathologist [facility] [phone] (ASCOM)  Alessandra Bevels Dewain Platz 10/09/2023,10:59 AM

## 2023-10-09 NOTE — Progress Notes (Signed)
Progress Note   Patient: Abigail [name] DOB: April 09, 1966 DOA: 10/08/2023     1 DOS: the patient was seen and examined on 10/09/2023   Brief hospital course: [name] is a 58 year old female with history of giant cell arteritis, seizure with status epilepticus, chronic pain, who presents emergency department from home for chief concerns of seizure.  Patient was discharged from the hospital on 10/04/2023, and reportedly has not been taking her prescribed seizure medications.  Vitals in the ED showed temperature of 98.9, respiration rate 16, heart rate 117, blood pressure 121/69, SpO2 of 93% on 3 L nasal cannula.  Serum sodium is 134, potassium 4.2, chloride 91, bicarb 27, BUN of 8, serum creatinine 0.56, EGFR greater than 60, nonfasting blood glucose 122, WBC 18.9, hemoglobin 11.1, platelets of 423.  CK is 35. COVID/influenza A/influenza B/RSV PCR were negative.  ED treatment: Patient was loaded with Keppra and neurology was consulted.  Neurology specialists recommended: Stat EEG, continue Keppra 1500 mg IV twice daily, carbamazepine 100 mg p.o. twice daily (may need NGT for administration), check carbamazepine level, and a stat CTA head and neck with CTP to rule out LVO.  CTA head neck: Read as no proximal intracranial large vessel occlusion or high-grade proximal artery stenosis identified.  2/19: Vital stable, UDS positive for opioids and benzos, labs with potassium of 3.1, improving leukocytosis at 15.2 but all cell line decreased.  Carbamazepine levels less than 2, phenytoin levels less than 2.5 and Keppra levels pending. This is consistent with being noncompliant with her seizure medications resulted in breakthrough seizures. Phenytoin was apparently discontinued previously. Neurology is recommending Keppra 1500 mg twice daily, carbamazepine 100 mg twice daily and Neurontin at 300 mg 4 times daily. Patient will need meds to bed for discharge.  Assessment  and Plan: * Seizure [facility]) Breakthrough seizure likely progressed to status epilepticus. With reported status epilepticus Seizure precaution, fall precaution, aspiration precaution Continue Keppra 1500 mg IV twice daily and carbamazepine 100 mg p.o. twice daily as recommended by neurologist Carbamazepine level is subtherapeutic Likely breakthrough seizure due to being noncompliant Mental status with significant improvement although appears depressed.  History of seizure Seizure precaution  Status epilepticus (HCC) Status post Keppra loading dose of 3000 mg IV in the ED Continue Keppra 1500 mg IV twice daily, carbamazepine 100 mg p.o. twice daily  Ativan 1 mg IV as needed for breakthrough seizure, 2 doses ordered with instructions to administer as appropriate and then let provider know  Aspiration pneumonia (HCC) In setting of increased opacity in the right lower lobe, leukocytosis Unasyn per pharmacy ordered on admission Oxygen therapy with nasal cannula as needed to maintain SpO2 greater than 92% Continuous pulse oximetry  SIRS (systemic inflammatory response syndrome) (HCC) With suspected aspiration pneumonia Pending formal radiologist read on chest x-ray Unasyn per pharmacy has been initiated on admission Incentive spirometry and flutter valve every 2 hours Fall and aspiration precaution  Depression Home duloxetine 60 mg daily resumed  Tobacco use disorder As needed nicotine patch ordered  DNR (do not resuscitate) MOST form in ACP reviewed  Chronic pain PDMP reviewed Patient currently has active prescription for gabapentin 300 mg, 120 capsules, 30-day supply that was written and filled on 10/04/2023 Lacosamide 200 mg tablet, 60 tablet, 30-day supply filled on 09/10/2023.  Anxiety Home duloxetine 600 mg daily resumed   Subjective: Patient was seen and examined today.  Still little drowsy but able to answer questions appropriately.  No focal deficit.  She was  feeling  little depressed.  Physical Exam: Vitals:   10/09/23 1034 10/09/23 1125 10/09/23 1241 10/09/23 1536  BP: 114/64 108/65 106/71 (!) 107/59  Pulse: 88 92 (!) 104 (!) 110  Resp: (!) 25 16 16 16   Temp: 98.7 F (37.1 C) 97.6 F (36.4 C) 97.7 F (36.5 C) 98.4 F (36.9 C)  TempSrc:  Oral  Oral  SpO2: 100% 98% 93% 98%  Weight:      Height:       General.  Frail lady, in no acute distress. Pulmonary.  Lungs clear bilaterally, normal respiratory effort. CV.  Regular rate and rhythm, no JVD, rub or murmur. Abdomen.  Soft, nontender, nondistended, BS positive. CNS.  Alert and oriented .  No focal neurologic deficit. Extremities.  No edema, no cyanosis, pulses intact and symmetrical.  Data Reviewed: Prior data reviewed  Family Communication: Tried calling son with no response.  Disposition: Status is: Inpatient Remains inpatient appropriate because: Severity of illness  Planned Discharge Destination: Home  DVT prophylaxis.  Subcu heparin Time spent: 50 minutes  This record has been created using Conservation officer, historic buildings. Errors have been sought and corrected,but may not always be located. Such creation errors do not reflect on the standard of care.   Author: Arnetha Courser, MD 10/09/2023 5:11 PM  For on call review [email or web address].

## 2023-10-09 NOTE — ED Notes (Signed)
Patient had a large, loose bowel movement. Patient requests no more apple juice

## 2023-10-09 NOTE — Progress Notes (Signed)
NEUROLOGY CONSULT FOLLOW UP NOTE   Date of service: October 09, 2023 Patient Name: Abigail [name] [facility] MRN:  [id number] DOB:  03/16/1966  Interval Hx/subjective  Per Dr. Sharen Hones note from one week ago, during the patient's last admission: "Neurology is consulted bc patient has been refusing her lacosamide. She follows with Dr. Sherryll Burger neurology outpatient. She was previously on keppra, vimpat, phenytoin, and gabapentin. She told him the phenytoin and vimpat she was on were making her tired so he gave her the following wean schedule for both and uptitration schedule for carbamazepine. His plan from note 09/17/23 was as follows:   - Taper phenytoin from 100 mg BID to 100 mg QD for two weeks, then discontinue - Simultaneously initiate carbamazepine at 50 mg BID for two weeks, then increase to 100 mg TWO TIMES A DAY" A Vimpat taper that was prescribed by Dr. Sherryll Burger is also outlined in Dr. Sharen Hones 2/12 note. The patient self-discontinued Vimpat early due to side effect of tiredness prior to her admission earlier this month. Vimpat was not continued that admission.  Per Dr. Sharen Hones note, the At home the patient had been actually taking the following regimen: - Keppra 1500mg  bid - Carbamazepine 100mg  bid - Gabapentin 300mg  qid  She was discharged on the above 3 medications on 2/14. It is suspected that the reason for the current admission is AED noncompliance resulting in breakthrough seizures evolving to status epilepticus.   Vitals   Vitals:   10/09/23 0509 10/09/23 0615 10/09/23 0700 10/09/23 0718  BP:  122/76 106/62 106/62  Pulse:  (!) 101 93 93  Resp:    (!) 25  Temp: 98.7 F (37.1 C)   98.7 F (37.1 C)  TempSrc: Oral     SpO2:  97% 99% 99%  Weight:      Height:         Body mass index is 23.48 kg/m.  Physical Exam   Physical Exam HEENT- [place]/AT.   Lungs- Respirations unlabored Extremities- Warm and well-perfused   Neurological Examination Mental Status: Mild drowsiness.  Awakens easily to voice. Oriented x 5. Depressed affect. Poorly cooperative with exam but follows basic motor commands. Does not make eye contact.  Cranial Nerves: II: Poorly cooperative. III,IV, VI: Eyes are conjugate at the midline. No ptosis.  Otherwise noncooperative.  V: Poorly cooperative  VII: Does not grimace or smile to command VIII: Hearing intact to voice IX,X: No hypophonia or hoarseness XII: No lingual dysarthria Motor: RUE: 5/5  LUE: 4+/5 proximally and distally RLE: 4+/5 knee extension, ADF/APF LLE: 4-/5 knee extension, ADF/APF  Sensory: FT intact to BUE and BLE.  Cerebellar: Noncooperative Gait: Unable to assess Other: No jerking, twitching, posturing or other seizure-like activity noted.     Medications  Current Facility-Administered Medications:    acetaminophen (TYLENOL) tablet 650 mg, 650 mg, Oral, Q6H PRN **OR** acetaminophen (TYLENOL) suppository 650 mg, 650 mg, Rectal, Q6H PRN, Cox, Amy N, DO   Ampicillin-Sulbactam (UNASYN) 3 g in sodium chloride 0.9 % 100 mL IVPB, 3 g, Intravenous, Q6H, Cox, Amy N, DO, Last Rate: 200 mL/hr at 10/09/23 0847, 3 g at 10/09/23 0847   carbamazepine (TEGRETOL) chewable tablet 100 mg, 100 mg, Per NG tube, BID, Cox, Amy N, DO, 100 mg at 10/08/23 2254   gabapentin (NEURONTIN) capsule 300 mg, 300 mg, Oral, QID, Cox, Amy N, DO   heparin injection 5,000 Units, 5,000 Units, Subcutaneous, Q8H, Cox, Amy N, DO, 5,000 Units at 10/09/23 0658   levETIRAcetam (KEPPRA) IVPB  1500 mg/ 100 mL premix, 1,500 mg, Intravenous, Q12H, Cox, Amy N, DO   LORazepam (ATIVAN) injection 1 mg, 1 mg, Intravenous, PRN, Cox, Amy N, DO   melatonin tablet 10 mg, 10 mg, Oral, QHS, Cox, Amy N, DO, 10 mg at 10/08/23 2254   metoprolol tartrate (LOPRESSOR) injection 5 mg, 5 mg, Intravenous, Q4H PRN, Cox, Amy N, DO   nicotine (NICODERM CQ - dosed in mg/24 hours) patch 14 mg, 14 mg, Transdermal, Daily PRN, Cox, Amy N, DO   ondansetron (ZOFRAN) tablet 4 mg, 4 mg, Oral, Q6H PRN  **OR** ondansetron (ZOFRAN) injection 4 mg, 4 mg, Intravenous, Q6H PRN, Cox, Amy N, DO   senna-docusate (Senokot-S) tablet 1 tablet, 1 tablet, Oral, QHS PRN, Cox, Amy N, DO  Current Outpatient Medications:    bisacodyl (DULCOLAX) 5 MG EC tablet, Take 1 tablet (5 mg total) by mouth daily as needed for moderate constipation., Disp: 30 tablet, Rfl: 0   carbamazepine (TEGRETOL XR) 100 MG 12 hr tablet, Take 1 tablet (100 mg total) by mouth 2 (two) times daily., Disp: 60 tablet, Rfl: 0   DULoxetine (CYMBALTA) 60 MG capsule, Take 1 capsule (60 mg total) by mouth daily., Disp: 30 capsule, Rfl: 0   gabapentin (NEURONTIN) 300 MG capsule, Take 1 capsule (300 mg total) by mouth 4 (four) times daily., Disp: 120 capsule, Rfl: 0   levETIRAcetam (KEPPRA) 750 MG tablet, Take 2 tablets (1,500 mg total) by mouth 2 (two) times daily., Disp: 120 tablet, Rfl: 0   Melatonin 10 MG TABS, Take 10 mg by mouth at bedtime., Disp: , Rfl:    mirtazapine (REMERON) 7.5 MG tablet, Take 1 tablet (7.5 mg total) by mouth at bedtime., Disp: 30 tablet, Rfl: 0   morphine (MSIR) 15 MG tablet, Take 15 mg by mouth 4 (four) times daily as needed for moderate pain (pain score 4-6)., Disp: , Rfl:    nicotine (NICODERM CQ - DOSED IN MG/24 HOURS) 14 mg/24hr patch, One patch chest wall daily (okay to substitute generic), Disp: 28 patch, Rfl: 0   ondansetron (ZOFRAN-ODT) 8 MG disintegrating tablet, Take 1 tablet (8 mg total) by mouth every 8 (eight) hours as needed for nausea., Disp: 20 tablet, Rfl: 1   pantoprazole (PROTONIX) 40 MG tablet, Take 40 mg by mouth daily., Disp: , Rfl:    polyethylene glycol powder (GLYCOLAX/MIRALAX) 17 GM/SCOOP powder, Mix 17 g with a beverage and drink once daily., Disp: 238 g, Rfl: 0   senna-docusate (SENOKOT-S) 8.6-50 MG tablet, Take 1 tablet by mouth 2 (two) times daily., Disp: 60 tablet, Rfl: 0   modafinil (PROVIGIL) 200 MG tablet, Take 200 mg by mouth daily., Disp: , Rfl:    NON FORMULARY, Apply 1 Application  topically 3 (three) times daily as needed. Rock Salt Cream (Similar to [employer]), Disp: , Rfl:   Labs and Diagnostic Imaging   CBC:  Recent Labs  Lab 10/08/23 0722 10/09/23 0304  WBC 18.9* 15.2*  NEUTROABS 16.6*  --   HGB 11.1* 10.2*  HCT 34.5* 31.2*  MCV 92.5 91.5  PLT 423* 355    Basic Metabolic Panel:  Lab Results  Component Value Date   NA 138 10/09/2023   K 3.1 (L) 10/09/2023   CO2 27 10/09/2023   GLUCOSE 122 (H) 10/09/2023   BUN 8 10/09/2023   CREATININE 0.38 (L) 10/09/2023   CALCIUM 8.1 (L) 10/09/2023   GFRNONAA >60 10/09/2023   GFRAA >60 01/15/2015   Lipid Panel:  Lab Results  Component  Value Date   LDLCALC 100 (H) 09/30/2023   HgbA1c:  Lab Results  Component Value Date   HGBA1C 5.5 12/24/2022   Urine Drug Screen:     Component Value Date/Time   LABOPIA POSITIVE (A) 10/08/2023 1301   COCAINSCRNUR NONE DETECTED 10/08/2023 1301   LABBENZ POSITIVE (A) 10/08/2023 1301   AMPHETMU NONE DETECTED 10/08/2023 1301   THCU NONE DETECTED 10/08/2023 1301   LABBARB NONE DETECTED 10/08/2023 1301    Alcohol Level     Component Value Date/Time   ETH <10 07/12/2023 0723   INR  Lab Results  Component Value Date   INR 1.2 07/29/2023   APTT  Lab Results  Component Value Date   APTT 37 (H) 07/29/2023   AED levels:  Lab Results  Component Value Date   PHENYTOIN <2.5 (L) 10/08/2023   LEVETIRACETA 68.5 (H) 07/12/2023    Assessment  58 y.o. 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 It was unknown for how long she had been seizing, but she was continuously seizing when son  found her. He administered her nasal anticonvulsant but she continued to seize and after 30 minutes he called EMS. On their arrival she was exhibiting generalized seizure activity and 2 mg IM Versed was administered, followed by another 2 mg IV, after which clinical seizure activity stopped. Glucose was in the normal range per EMS. She was densely postictal on arrival to the ED, not moving any of her extremities but able to answer some simple questions. She was loaded with 3000 mg IV Keppra after arrival to the ED.  - Examinations: - Exam yesterday revealed a lethargic female who responds to her name, can state that she is in the hospital and follow right sided simple motor commands, but is unable to gaze to the left, with flaccid LUE and LLE, but intact sensation to pain on the left.  - Exam today reveals significant improvement in her left sided weakness, nearly back to full strength. She has a depressed affect, but is fully oriented x 5.  - CT head: No evidence of intracranial hemorrhage, acute infarction, hydrocephalus, extra-axial collection, or mass lesion/mass effect. No hyperdense vessel or other acute findings. No evidence of fracture or other significant bone abnormality. - CTA of head and neck with CTP, performed emergently at the time of her presentation yesterday, ruled out stroke as the etiology for her left sided weakness.  - EEG performed yesterday (Tuesday): Continuous slow, generalized. This study is suggestive of mild to moderate diffuse encephalopathy. No seizures or epileptiform discharges were seen throughout the recording. - Labs: - Estimated GFR > 60 - WBC 18.9  - Phenytoin < 2.5 (consistent with having been discontinued previously) - Glucose 122 - Carbamazepine level < 2.0, consistent with noncompliance. Patient states this morning that "I missed a couple of doses".  - Overall impression:  - Epilepsy - Breakthrough seizures with status epilepticus, now resolved.  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    RECOMMENDATIONS  - Continue Keppra at 1500 mg BID. Can switch to PO.  - Continue carbamazepine at  100 mg po BID.   - Continue Neurontin at 300 mg po QID  - PT/OT - Outpatient Neurology follow up - She has been counseled by Neurology to maintain compliance with all of her AEDs. This may need to be reinforced again by other team members.  - Neurohospitalist service will sign off. Please call if there are additional questions.     ______________________________________________________________________   Dessa Phi, Gilbert Man[place], MD [facility]

## 2023-10-10 DIAGNOSIS — Z87898 Personal history of other specified conditions: Secondary | ICD-10-CM | POA: Diagnosis not present

## 2023-10-10 DIAGNOSIS — J690 Pneumonitis due to inhalation of food and vomit: Secondary | ICD-10-CM | POA: Diagnosis not present

## 2023-10-10 DIAGNOSIS — R6510 Systemic inflammatory response syndrome (SIRS) of non-infectious origin without acute organ dysfunction: Secondary | ICD-10-CM | POA: Diagnosis not present

## 2023-10-10 DIAGNOSIS — G40901 Epilepsy, unspecified, not intractable, with status epilepticus: Secondary | ICD-10-CM | POA: Diagnosis not present

## 2023-10-10 LAB — CBC
HCT: 29.5 % — ABNORMAL LOW (ref 36.0–46.0)
Hemoglobin: 9.6 g/dL — ABNORMAL LOW (ref 12.0–15.0)
MCH: 29.6 pg (ref 26.0–34.0)
MCHC: 32.5 g/dL (ref 30.0–36.0)
MCV: 91 fL (ref 80.0–100.0)
Platelets: 296 10*3/uL (ref 150–400)
RBC: 3.24 MIL/uL — ABNORMAL LOW (ref 3.87–5.11)
RDW: 15.3 % (ref 11.5–15.5)
WBC: 10.6 10*3/uL — ABNORMAL HIGH (ref 4.0–10.5)
nRBC: 0 % (ref 0.0–0.2)

## 2023-10-10 LAB — BASIC METABOLIC PANEL
Anion gap: 8 (ref 5–15)
BUN: 7 mg/dL (ref 6–20)
CO2: 31 mmol/L (ref 22–32)
Calcium: 7.4 mg/dL — ABNORMAL LOW (ref 8.9–10.3)
Chloride: 97 mmol/L — ABNORMAL LOW (ref 98–111)
Creatinine, Ser: 0.38 mg/dL — ABNORMAL LOW (ref 0.44–1.00)
GFR, Estimated: >60 mL/min (ref >60)
Glucose, Bld: 91 mg/dL (ref 70–99)
Potassium: 2.7 mmol/L — CL (ref 3.5–5.1)
Sodium: 136 mmol/L (ref 135–145)

## 2023-10-10 LAB — MAGNESIUM: Magnesium: 1.9 mg/dL (ref 1.7–2.4)

## 2023-10-10 MED ORDER — AMOXICILLIN-POT CLAVULANATE 875-125 MG PO TABS
1.0000 | ORAL_TABLET | Freq: Two times a day (BID) | ORAL | Status: AC
  Administered 2023-10-10 – 2023-10-15 (×12): 1 via ORAL
  Filled 2023-10-10 (×12): qty 1

## 2023-10-10 MED ORDER — POTASSIUM CHLORIDE 20 MEQ PO PACK
40.0000 meq | PACK | Freq: Once | ORAL | Status: AC
  Administered 2023-10-10: 40 meq via ORAL
  Filled 2023-10-10: qty 2

## 2023-10-10 MED ORDER — ENOXAPARIN SODIUM 40 MG/0.4ML IJ SOSY
40.0000 mg | PREFILLED_SYRINGE | INTRAMUSCULAR | Status: DC
  Administered 2023-10-10 – 2023-10-15 (×6): 40 mg via SUBCUTANEOUS
  Filled 2023-10-10 (×7): qty 0.4

## 2023-10-10 MED ORDER — POTASSIUM CHLORIDE 10 MEQ/100ML IV SOLN
10.0000 meq | INTRAVENOUS | Status: DC
  Administered 2023-10-10: 10 meq via INTRAVENOUS
  Filled 2023-10-10 (×2): qty 100

## 2023-10-10 MED ORDER — LEVETIRACETAM 500 MG PO TABS
1500.0000 mg | ORAL_TABLET | Freq: Two times a day (BID) | ORAL | Status: DC
  Administered 2023-10-10 – 2023-10-16 (×12): 1500 mg via ORAL
  Filled 2023-10-10 (×12): qty 3

## 2023-10-10 MED ORDER — MORPHINE SULFATE 15 MG PO TABS
15.0000 mg | ORAL_TABLET | Freq: Four times a day (QID) | ORAL | Status: DC | PRN
  Administered 2023-10-10 – 2023-10-11 (×3): 15 mg via ORAL
  Filled 2023-10-10 (×3): qty 1

## 2023-10-10 MED ORDER — POTASSIUM CHLORIDE CRYS ER 20 MEQ PO TBCR
40.0000 meq | EXTENDED_RELEASE_TABLET | ORAL | Status: AC
  Administered 2023-10-10 (×2): 40 meq via ORAL
  Filled 2023-10-10 (×2): qty 2

## 2023-10-10 MED ORDER — DULOXETINE HCL 30 MG PO CPEP
60.0000 mg | ORAL_CAPSULE | Freq: Every day | ORAL | Status: DC
  Administered 2023-10-10 – 2023-10-11 (×2): 60 mg via ORAL
  Filled 2023-10-10 (×2): qty 2

## 2023-10-10 NOTE — Assessment & Plan Note (Signed)
Breakthrough seizure likely progressed to status epilepticus. With reported status epilepticus Seizure precaution, fall precaution, aspiration precaution Continue Keppra 1500 mg IV twice daily and carbamazepine 100 mg p.o. twice daily as recommended by neurologist Carbamazepine level is subtherapeutic Likely breakthrough seizure due to being noncompliant Mental status with significant improvement although appears depressed.

## 2023-10-10 NOTE — Assessment & Plan Note (Signed)
Seizure precaution

## 2023-10-10 NOTE — Assessment & Plan Note (Signed)
Home duloxetine 60 mg daily resumed, psych is recommending to increase the dose to 90 mg. Psych evaluation was requested as patient was keeps saying that he wants to die but denies any definitive suicidal plan-patient does not need inpatient management. Remeron was also added to help with depression and appetite

## 2023-10-10 NOTE — Care Management Important Message (Signed)
Important Message  Patient Details  Name: Abigail [name] [facility] MRN: [id number] Date of Birth: 10-10-1965   Important Message Given:  Yes - Medicare IM     Cristela Blue, CMA 10/10/2023, 10:29 AM

## 2023-10-10 NOTE — Progress Notes (Signed)
Patient ID: Abigail [name] [facility], female   DOB: 16-Jan-1966, 58 y.o.   MRN: [id number]  An attempt was made to see the patient today. Patient was with a visitor on a phone called and asked the provider to come back at a later time.

## 2023-10-10 NOTE — Progress Notes (Signed)
Progress Note   Patient: Abigail [name] [facility] ZOX:[id number] DOB: 06/14/1966 DOA: 10/08/2023     2 DOS: the patient was seen and examined on 10/10/2023   Brief hospital course: Ms. Sakura Denis is a 58 year old female with history of giant cell arteritis, seizure with status epilepticus, chronic pain, who presents emergency department from home for chief concerns of seizure.  Patient was discharged from the hospital on 10/04/2023, and reportedly has not been taking her prescribed seizure medications.  Vitals in the ED showed temperature of 98.9, respiration rate 16, heart rate 117, blood pressure 121/69, SpO2 of 93% on 3 L nasal cannula.  Serum sodium is 134, potassium 4.2, chloride 91, bicarb 27, BUN of 8, serum creatinine 0.56, EGFR greater than 60, nonfasting blood glucose 122, WBC 18.9, hemoglobin 11.1, platelets of 423.  CK is 35. COVID/influenza A/influenza B/RSV PCR were negative.  ED treatment: Patient was loaded with Keppra and neurology was consulted.  Neurology specialists recommended: Stat EEG, continue Keppra 1500 mg IV twice daily, carbamazepine 100 mg p.o. twice daily (may need NGT for administration), check carbamazepine level, and a stat CTA head and neck with CTP to rule out LVO.  CTA head neck: Read as no proximal intracranial large vessel occlusion or high-grade proximal artery stenosis identified.  2/19: Vital stable, UDS positive for opioids and benzos, labs with potassium of 3.1, improving leukocytosis at 15.2 but all cell line decreased.  Carbamazepine levels less than 2, phenytoin levels less than 2.5 and Keppra levels pending. This is consistent with being noncompliant with her seizure medications resulted in breakthrough seizures. Phenytoin was apparently discontinued previously. Neurology is recommending Keppra 1500 mg twice daily, carbamazepine 100 mg twice daily and Neurontin at 300 mg 4 times daily. Patient will need meds to bed for discharge.  2/20:  Patient seems very depressed and keeps saying that he wants to die so psych evaluation was ordered.  Denies any suicidal thoughts but just very frustrated.  Asking to resume home pain medications as pain interfere with all the activities.  Home p.o. morphine was restarted.  PT is recommending SNF  Assessment and Plan: * Seizure [facility]) Breakthrough seizure likely progressed to status epilepticus. With reported status epilepticus Seizure precaution, fall precaution, aspiration precaution Continue Keppra 1500 mg IV twice daily and carbamazepine 100 mg p.o. twice daily as recommended by neurologist Carbamazepine level is subtherapeutic Likely breakthrough seizure due to being noncompliant Mental status with significant improvement although appears depressed.  History of seizure Seizure precaution  Status epilepticus (HCC) Status post Keppra loading dose of 3000 mg IV in the ED Continue Keppra 1500 mg IV twice daily, carbamazepine 100 mg p.o. twice daily  Ativan 1 mg IV as needed for breakthrough seizure, 2 doses ordered with instructions to administer as appropriate and then let provider know  Aspiration pneumonia (HCC) In setting of increased opacity in the right lower lobe, leukocytosis Unasyn per pharmacy ordered on admission Oxygen therapy with nasal cannula as needed to maintain SpO2 greater than 92% Continuous pulse oximetry  SIRS (systemic inflammatory response syndrome) (HCC) With suspected aspiration pneumonia Pending formal radiologist read on chest x-ray Unasyn per pharmacy has been initiated on admission Incentive spirometry and flutter valve every 2 hours Fall and aspiration precaution  Depression Home duloxetine 60 mg daily resumed Psych evaluation was requested as patient was keeps saying that he wants to die but denies any definitive suicidal plan  Tobacco use disorder As needed nicotine patch ordered  DNR (do not resuscitate) MOST form  in ACP reviewed  Chronic  pain PDMP reviewed Patient currently has active prescription for gabapentin 300 mg, 120 capsules, 30-day supply that was written and filled on 10/04/2023 Lacosamide 200 mg tablet, 60 tablet, 30-day supply filled on 09/10/2023. Home p.o. morphine was also restarted today  Anxiety Home duloxetine 600 mg daily resumed   Subjective: Patient was more alert and oriented when seen today.  She was complaining of a lot of generalized body ache and asking to restart her home pain medications.  She was keeps saying that she wants to die as she cannot do anything due to this pain.  Denies any definitive suicidal thoughts or plans.  Physical Exam: Vitals:   10/09/23 1536 10/09/23 2013 10/10/23 0336 10/10/23 0804  BP: (!) 107/59 (!) 101/44 96/66 (!) 113/92  Pulse: (!) 110  90 (!) 111  Resp: 16 16 18 18   Temp: 98.4 F (36.9 C) 97.8 F (36.6 C) 98 F (36.7 C) 97.8 F (36.6 C)  TempSrc: Oral Oral    SpO2: 98% 95% 96% 98%  Weight:      Height:       General.  Frail lady, in no acute distress. Pulmonary.  Lungs clear bilaterally, normal respiratory effort. CV.  Regular rate and rhythm, no JVD, rub or murmur. Abdomen.  Soft, nontender, nondistended, BS positive. CNS.  Alert and oriented .  No focal neurologic deficit. Extremities.  No edema, no cyanosis, pulses intact and symmetrical. Psychiatry.  Judgment and insight appears normal.   Data Reviewed: Prior data reviewed  Family Communication: Tried calling son with no response.  Disposition: Status is: Inpatient Remains inpatient appropriate because: Severity of illness  Planned Discharge Destination: Home  DVT prophylaxis.  Subcu heparin Time spent: 50 minutes  This record has been created using Conservation officer, historic buildings. Errors have been sought and corrected,but may not always be located. Such creation errors do not reflect on the standard of care.   Author: Arnetha Courser, MD 10/10/2023 5:02 PM  For on call review  [email or web address].

## 2023-10-10 NOTE — Progress Notes (Signed)
Speech Language Pathology Treatment: Dysphagia  Patient Details Name: Abigail [name] [facility] MRN: [id number] DOB: [birth year] Today's Date: 10/10/2023 Time: 1020-1030 SLP Time Calculation (min) (ACUTE ONLY): 10 min  Assessment / Plan / Recommendation Clinical Impression  Pt seen for diet tolerance and trials upgraded textures. Pt demonstrated an intact oral swallow. Pharyngeal swallow appeared [facility] per clinical re-assessment. SLP to sign off as pt has no acute SLP needs at this time.   HPI HPI: Pt is a "58 year old female with history of giant cell arteritis, seizure with status epilepticus, chronic pain, who presents emergency department from home for chief concerns of seizure.     Patient was discharged from the hospital on 10/04/2023, and reportedly has not been taking her prescribed seizure medications." (per H&P). CXR, 10/09/23, "Very low lung volumes limiting assessment. Streaky bibasilar  opacities are similar to prior exam, favor atelectasis."      SLP Plan  All goals met (D/C ST)      Recommendations for follow up therapy are one component of a multi-disciplinary discharge planning process, led by the attending physician.  Recommendations may be updated based on patient status, additional functional criteria and insurance authorization.    Recommendations  Diet recommendations: Regular;Thin liquid Liquids provided via: Teaspoon;Cup;Straw Medication Administration:  (as tolerated) Supervision: Patient able to self feed Compensations: Minimize environmental distractions;Slow rate;Small sips/bites Postural Changes and/or Swallow Maneuvers: Out of bed for meals;Seated upright 90 degrees (upright 60-90 minutes after meals; reflux precautions)                  Oral care BID;Staff/trained caregiver to provide oral care     Dysphagia, oral phase (R13.11)     All goals met (D/C ST)    Abigail [name], M.S., CCC-SLP Speech-Language Pathologist [facility] [phone] Abigail [name])  Abigail [name]  10/10/2023, 12:09 PM

## 2023-10-10 NOTE — Plan of Care (Signed)
   Problem: Education: Goal: Knowledge of General Education information will improve Description: Including pain rating scale, medication(s)/side effects and non-pharmacologic comfort measures Outcome: Progressing   Problem: Clinical Measurements: Goal: Ability to maintain clinical measurements within normal limits will improve Outcome: Progressing   Problem: Activity: Goal: Risk for activity intolerance will decrease Outcome: Progressing   Problem: Nutrition: Goal: Adequate nutrition will be maintained Outcome: Progressing   Problem: Pain Managment: Goal: General experience of comfort will improve and/or be controlled Outcome: Progressing   Problem: Safety: Goal: Ability to remain free from injury will improve Outcome: Progressing   Problem: Skin Integrity: Goal: Risk for impaired skin integrity will decrease Outcome: Progressing

## 2023-10-10 NOTE — Evaluation (Addendum)
Physical Therapy Evaluation Patient Details Name: Abigail [name] [facility] MRN: [id number] DOB: [birth year] Today's Date: 10/10/2023  History of Present Illness  58 y/o female presented to ED on 10/08/23 for status epilepticus. Admitted for seizure. PMH: COPD, epilepsy, chronic pain syndrome, chronic hypoxic respiratory failure on 2L, giant cell arteritis, anxiety, RA  Clinical Impression  Patient admitted with the above. PTA, patient lives with son and reports she was ambulatory with RW and son assists as needed. Typically on 2L O2. On arrival, patient stating "I'm dying" and "I feel like death". MD came in and educated patient that in fact she was not dying. Patient presents with weakness, impaired balance, and decreased activity tolerance along with lack of effort to participate. Required maxA for bed mobility with patient with poor effort to participate. Anticipate patient is able to do more than she lets on during evaluation. Patient deferred attempt at standing due to uncontrollable diarrhea. Patient will benefit from skilled PT services during acute stay to address listed deficits. Patient will benefit from ongoing therapy at discharge to maximize functional independence and safety.       If plan is discharge home, recommend the following: A lot of help with walking and/or transfers;A lot of help with bathing/dressing/bathroom   Can travel by private vehicle   No    Equipment Recommendations None recommended by PT  Recommendations for Other Services       Functional Status Assessment Patient has had a recent decline in their functional status and demonstrates the ability to make significant improvements in function in a reasonable and predictable amount of time.     Precautions / Restrictions Precautions Precautions: Fall Recall of Precautions/Restrictions: Intact Precaution/Restrictions Comments: on 2L O2 Restrictions Weight Bearing Restrictions Per Provider Order: No       Mobility  Bed Mobility Overal bed mobility: Needs Assistance Bed Mobility: Supine to Sit, Sit to Supine, Rolling Rolling: Mod assist   Supine to sit: Max assist Sit to supine: Max assist   General bed mobility comments: assist for BLE advancement and trunk elevation. Minimal effort provided by patient. Rolling for pericare prior to supine>sit due to diarrhea    Transfers                   General transfer comment: patient declined 2/2 uncontrollable diarrhea and lacks effort to participate    Ambulation/Gait                  Stairs            Wheelchair Mobility     Tilt Bed    Modified Rankin (Stroke Patients Only)       Balance Overall balance assessment: Needs assistance Sitting-balance support: Feet supported, Bilateral upper extremity supported, Single extremity supported Sitting balance-Leahy Scale: Fair                                       Pertinent Vitals/Pain Pain Assessment Pain Assessment: Faces Faces Pain Scale: Hurts a little bit Pain Location: generalized Pain Descriptors / Indicators: Discomfort Pain Intervention(s): Monitored during session    Home Living Family/patient expects to be discharged to:: Private residence Living Arrangements: Children Available Help at Discharge: Family;Available 24 hours/day Type of Home: Mobile home Home Access: Ramped entrance       Home Layout: One level Home Equipment: Agricultural consultant (2 wheels);Wheelchair - Set designer (4 wheels) Additional Comments: lives  with son who is there 24/7    Prior Function Prior Level of Function : Needs assist             Mobility Comments: reports she uses the [place] in home; pt only leaves house for doctors appt per her report; ADLs Comments: pt reports she performs feeding, grooming, dressing, toileting with MOD I, has not gotten in the shower "in awhile" uses the bath wipes instead; assist for IADLs  from son, mom, or friend     Extremity/Trunk Assessment   Upper Extremity Assessment Upper Extremity Assessment: Generalized weakness    Lower Extremity Assessment Lower Extremity Assessment: Generalized weakness    Cervical / Trunk Assessment Cervical / Trunk Assessment: Kyphotic  Communication   Communication Communication: No apparent difficulties    Cognition Arousal: Alert Behavior During Therapy: Flat affect   PT - Cognitive impairments: No family/caregiver present to determine baseline                       PT - Cognition Comments: reports she feels like death and she is dying. Follows commands with increased time but puts forth no effort to participate Following commands: Intact       Cueing       General Comments      Exercises     Assessment/Plan    PT Assessment Patient needs continued PT services  PT Problem List Decreased strength;Decreased activity tolerance;Decreased balance;Decreased mobility       PT Treatment Interventions DME instruction;Gait training;Therapeutic activities;Therapeutic exercise;Balance training;Patient/family education    PT Goals (Current goals can be found in the Care Plan section)  Acute Rehab PT Goals Patient Stated Goal: "just let me go" - referring to death PT Goal Formulation: With patient Time For Goal Achievement: 10/24/23 Potential to Achieve Goals: Fair    Frequency Min 1X/week     Co-evaluation               AM-PAC PT "6 Clicks" Mobility  Outcome Measure Help needed turning from your back to your side while in a flat bed without using bedrails?: A Lot Help needed moving from lying on your back to sitting on the side of a flat bed without using bedrails?: A Lot Help needed moving to and from a bed to a chair (including a wheelchair)?: Total Help needed standing up from a chair using your arms (e.g., wheelchair or bedside chair)?: Total Help needed to walk in hospital room?: Total Help  needed climbing 3-5 steps with a railing? : Total 6 Click Score: 8    End of Session Equipment Utilized During Treatment: Oxygen Activity Tolerance: Patient limited by fatigue Patient left: in bed;with call bell/phone within reach;with bed alarm set;with family/visitor present Nurse Communication: Mobility status PT Visit Diagnosis: Muscle weakness (generalized) (M62.81);Difficulty in walking, not elsewhere classified (R26.2);Unsteadiness on feet (R26.81)    Time: 1000-1029 PT Time Calculation (min) (ACUTE ONLY): 29 min   Charges:   PT Evaluation $PT Eval Moderate Complexity: 1 Mod PT Treatments $Therapeutic Activity: 8-22 mins PT General Charges $$ ACUTE PT VISIT: 1 Visit         Maylon Peppers, PT, DPT Physical Therapist - [facility]  [facility]   Colbey Wirtanen A Tashay Bozich 10/10/2023, 12:46 PM

## 2023-10-10 NOTE — Progress Notes (Signed)
   10/10/23 2018  Assess: MEWS Score  Temp 98.6 F (37 C)  BP 99/63  MAP (mmHg) 75  Pulse Rate (!) 114  Resp 18  SpO2 95 %  O2 Device Nasal Cannula  O2 Flow Rate (L/min) 2 L/min  Assess: MEWS Score  MEWS Temp 0  MEWS Systolic 1  MEWS Pulse 2  MEWS RR 0  MEWS LOC 0  MEWS Score 3  MEWS Score Color Yellow  Assess: if the MEWS score is Yellow or Red  Were vital signs accurate and taken at a resting state? Yes  Does the patient meet 2 or more of the SIRS criteria? No  MEWS guidelines implemented  No, previously yellow, continue vital signs every 4 hours  Notify: Charge Nurse/RN  Name of Charge Nurse/RN Notified Jackie,RN  Assess: SIRS CRITERIA  SIRS Temperature  0  SIRS Respirations  0  SIRS Pulse 1  SIRS WBC 0  SIRS Score Sum  1   Patient have had a yellow mews on this admission, does have PRN for her heart rate.

## 2023-10-10 NOTE — Plan of Care (Signed)
  Problem: Education: Goal: Knowledge of General Education information will improve Description: Including pain rating scale, medication(s)/side effects and non-pharmacologic comfort measures Outcome: Progressing   Problem: Clinical Measurements: Goal: Ability to maintain clinical measurements within normal limits will improve Outcome: Progressing   Problem: Activity: Goal: Risk for activity intolerance will decrease Outcome: Progressing   Problem: Nutrition: Goal: Adequate nutrition will be maintained Outcome: Progressing   Problem: Coping: Goal: Level of anxiety will decrease Outcome: Progressing   Problem: Elimination: Goal: Will not experience complications related to bowel motility Outcome: Progressing   Problem: Pain Managment: Goal: General experience of comfort will improve and/or be controlled Outcome: Progressing   Problem: Safety: Goal: Ability to remain free from injury will improve Outcome: Progressing   Problem: Skin Integrity: Goal: Risk for impaired skin integrity will decrease Outcome: Progressing

## 2023-10-10 NOTE — Plan of Care (Signed)

## 2023-10-10 NOTE — Assessment & Plan Note (Signed)
PDMP reviewed. Home morphine was restarted yesterday-switching to oxycodone as morphine was causing a lot of sedation and per patient oxycodone helps her better. Cymbalta dose was also been increased to 90 Decreasing the frequency of gabapentin to 3 times daily Lidocaine patch was also ordered

## 2023-10-10 NOTE — Progress Notes (Signed)
2 PIV's requested by RN.  Notified all meds can easily be staggered.  Pt difficult start and multiple bruises.  Notified of need to salvage veins and only one PIV obtained.

## 2023-10-11 ENCOUNTER — Telehealth: Payer: Self-pay | Admitting: Family Medicine

## 2023-10-11 DIAGNOSIS — R569 Unspecified convulsions: Secondary | ICD-10-CM | POA: Diagnosis not present

## 2023-10-11 DIAGNOSIS — J690 Pneumonitis due to inhalation of food and vomit: Secondary | ICD-10-CM | POA: Diagnosis not present

## 2023-10-11 DIAGNOSIS — Z87898 Personal history of other specified conditions: Secondary | ICD-10-CM | POA: Diagnosis not present

## 2023-10-11 DIAGNOSIS — R6510 Systemic inflammatory response syndrome (SIRS) of non-infectious origin without acute organ dysfunction: Secondary | ICD-10-CM | POA: Diagnosis not present

## 2023-10-11 DIAGNOSIS — F419 Anxiety disorder, unspecified: Secondary | ICD-10-CM | POA: Diagnosis not present

## 2023-10-11 DIAGNOSIS — F339 Major depressive disorder, recurrent, unspecified: Secondary | ICD-10-CM | POA: Diagnosis not present

## 2023-10-11 LAB — BASIC METABOLIC PANEL
Anion gap: 8 (ref 5–15)
BUN: 6 mg/dL (ref 6–20)
CO2: 27 mmol/L (ref 22–32)
Calcium: 7.4 mg/dL — ABNORMAL LOW (ref 8.9–10.3)
Chloride: 94 mmol/L — ABNORMAL LOW (ref 98–111)
Creatinine, Ser: 0.36 mg/dL — ABNORMAL LOW (ref 0.44–1.00)
GFR, Estimated: >60 mL/min (ref >60)
Glucose, Bld: 101 mg/dL — ABNORMAL HIGH (ref 70–99)
Potassium: 4.6 mmol/L (ref 3.5–5.1)
Sodium: 130 mmol/L — ABNORMAL LOW (ref 135–145)

## 2023-10-11 MED ORDER — SIMETHICONE 80 MG PO CHEW
160.0000 mg | CHEWABLE_TABLET | Freq: Once | ORAL | Status: AC
  Administered 2023-10-11: 160 mg via ORAL
  Filled 2023-10-11: qty 2

## 2023-10-11 MED ORDER — GABAPENTIN 300 MG PO CAPS
300.0000 mg | ORAL_CAPSULE | Freq: Three times a day (TID) | ORAL | Status: DC
  Administered 2023-10-11 – 2023-10-16 (×15): 300 mg via ORAL
  Filled 2023-10-11 (×16): qty 1

## 2023-10-11 MED ORDER — ENSURE ENLIVE PO LIQD
237.0000 mL | Freq: Two times a day (BID) | ORAL | Status: DC
  Administered 2023-10-11 – 2023-10-12 (×3): 237 mL via ORAL

## 2023-10-11 MED ORDER — CALCIUM CARBONATE ANTACID 500 MG PO CHEW: 1.0000 | CHEWABLE_TABLET | Freq: Two times a day (BID) | ORAL | Status: DC | PRN

## 2023-10-11 MED ORDER — OXYCODONE HCL 5 MG PO TABS
5.0000 mg | ORAL_TABLET | Freq: Three times a day (TID) | ORAL | Status: DC | PRN
  Administered 2023-10-11 – 2023-10-16 (×14): 5 mg via ORAL
  Filled 2023-10-11 (×15): qty 1

## 2023-10-11 MED ORDER — MORPHINE SULFATE 15 MG PO TABS: 15.0000 mg | ORAL_TABLET | Freq: Two times a day (BID) | ORAL | Status: DC | PRN

## 2023-10-11 MED ORDER — DULOXETINE HCL 30 MG PO CPEP
90.0000 mg | ORAL_CAPSULE | Freq: Every day | ORAL | Status: DC
  Administered 2023-10-12 – 2023-10-16 (×5): 90 mg via ORAL
  Filled 2023-10-11 (×5): qty 3

## 2023-10-11 MED ORDER — MIRTAZAPINE 15 MG PO TBDP
15.0000 mg | ORAL_TABLET | Freq: Every day | ORAL | Status: DC
  Administered 2023-10-11 – 2023-10-14 (×4): 15 mg via ORAL
  Filled 2023-10-11 (×4): qty 1

## 2023-10-11 MED ORDER — LIDOCAINE 5 % EX PTCH
2.0000 | MEDICATED_PATCH | CUTANEOUS | Status: DC
  Administered 2023-10-11 – 2023-10-15 (×5): 2 via TRANSDERMAL
  Filled 2023-10-11 (×7): qty 2

## 2023-10-11 MED ORDER — LACTATED RINGERS IV SOLN: INTRAVENOUS | Status: AC

## 2023-10-11 MED ORDER — PANTOPRAZOLE SODIUM 40 MG PO TBEC
40.0000 mg | DELAYED_RELEASE_TABLET | Freq: Every day | ORAL | Status: DC
  Administered 2023-10-11 – 2023-10-16 (×6): 40 mg via ORAL
  Filled 2023-10-11 (×6): qty 1

## 2023-10-11 NOTE — Progress Notes (Signed)
Progress Note   Patient: Abigail [name] [facility] XNA:[id number] DOB: 05/06/1966 DOA: 10/08/2023     3 DOS: the patient was seen and examined on 10/11/2023   Brief hospital course: [name] is a 58 year old female with history of giant cell arteritis, seizure with status epilepticus, chronic pain, who presents emergency department from home for chief concerns of seizure.  Patient was discharged from the hospital on 10/04/2023, and reportedly has not been taking her prescribed seizure medications.  Vitals in the ED showed temperature of 98.9, respiration rate 16, heart rate 117, blood pressure 121/69, SpO2 of 93% on 3 L nasal cannula.  Serum sodium is 134, potassium 4.2, chloride 91, bicarb 27, BUN of 8, serum creatinine 0.56, EGFR greater than 60, nonfasting blood glucose 122, WBC 18.9, hemoglobin 11.1, platelets of 423.  CK is 35. COVID/influenza A/influenza B/RSV PCR were negative.  ED treatment: Patient was loaded with Keppra and neurology was consulted.  Neurology specialists recommended: Stat EEG, continue Keppra 1500 mg IV twice daily, carbamazepine 100 mg p.o. twice daily (may need NGT for administration), check carbamazepine level, and a stat CTA head and neck with CTP to rule out LVO.  CTA head neck: Read as no proximal intracranial large vessel occlusion or high-grade proximal artery stenosis identified.  2/19: Vital stable, UDS positive for opioids and benzos, labs with potassium of 3.1, improving leukocytosis at 15.2 but all cell line decreased.  Carbamazepine levels less than 2, phenytoin levels less than 2.5 and Keppra levels pending. This is consistent with being noncompliant with her seizure medications resulted in breakthrough seizures. Phenytoin was apparently discontinued previously. Neurology is recommending Keppra 1500 mg twice daily, carbamazepine 100 mg twice daily and Neurontin at 300 mg 4 times daily. Patient will need meds to bed for discharge.  2/20:  Patient seems very depressed and keeps saying that he wants to die so psych evaluation was ordered.  Denies any suicidal thoughts but just very frustrated.  Asking to resume home pain medications as pain interfere with all the activities.  Home p.o. morphine was restarted.  PT is recommending SNF  2/21: Still feeling depressed and lethargic, did not qualify for inpatient psych treatment.  Remeron was added to help with appetite and depression.  Psych is recommending increasing the dose of Cymbalta to 90, mild hyponatremia today, hypokalemia has been resolved.  Morphine switched with low-dose oxycodone at her request stating that morphine is not very helpful, decreasing the frequency of gabapentin as she appears quite sedated.  Also added lidocaine patch to help with backache so we can avoid whole lot of opioids or sedating medications. Diarrhea has been resolved so no GI pathogen panel collected In the presence of psych she made herself full code-CODE STATUS changed.  Assessment and Plan: * Seizure [facility]) Breakthrough seizure likely progressed to status epilepticus. With reported status epilepticus Seizure precaution, fall precaution, aspiration precaution Continue Keppra 1500 mg IV twice daily and carbamazepine 100 mg p.o. twice daily as recommended by neurologist Carbamazepine level is subtherapeutic Likely breakthrough seizure due to being noncompliant Mental status with significant improvement although appears depressed.  History of seizure Seizure precaution  Status epilepticus (HCC) Status post Keppra loading dose of 3000 mg IV in the ED Continue Keppra 1500 mg IV twice daily, carbamazepine 100 mg p.o. twice daily  Ativan 1 mg IV as needed for breakthrough seizure, 2 doses ordered with instructions to administer as appropriate and then let provider know  Aspiration pneumonia (HCC) In setting of increased opacity  in the right lower lobe, leukocytosis Unasyn is being switched with  Augmentin to complete a 5-day course Oxygen therapy with nasal cannula as needed to maintain SpO2 greater than 92% Continuous pulse oximetry  SIRS (systemic inflammatory response syndrome) (HCC) With suspected aspiration pneumonia Pending formal radiologist read on chest x-ray Unasyn per pharmacy has been initiated on admission Incentive spirometry and flutter valve every 2 hours Fall and aspiration precaution  Depression Home duloxetine 60 mg daily resumed, psych is recommending to increase the dose to 90 mg. Psych evaluation was requested as patient was keeps saying that he wants to die but denies any definitive suicidal plan-patient does not need inpatient management. Remeron was also added to help with depression and appetite  Tobacco use disorder As needed nicotine patch ordered  DNR (do not resuscitate) MOST form in ACP reviewed on admission Patient made herself full code today and revoked DNR  Chronic pain PDMP reviewed. Home morphine was restarted yesterday-switching to oxycodone as morphine was causing a lot of sedation and per patient oxycodone helps her better. Cymbalta dose was also been increased to 90 Decreasing the frequency of gabapentin to 3 times daily Lidocaine patch was also ordered  Anxiety Home duloxetine 600 mg daily resumed   Subjective: Patient was still feeling lethargic and depressed when seen today.  Nursing concern of some excessive sedation.  Multiple family members at bedside.  Appetite remained poor.  Physical Exam: Vitals:   10/11/23 0436 10/11/23 0803 10/11/23 1106 10/11/23 1411  BP: 120/69 (!) 105/54 107/79 122/83  Pulse: 92 94 90   Resp: 18 20 20    Temp: 97.8 F (36.6 C) 97.9 F (36.6 C) 97.8 F (36.6 C)   TempSrc:  Oral Oral   SpO2: 93% 94% 97%   Weight:      Height:       General.  Frail and depressed lady, in no acute distress. Pulmonary.  Lungs clear bilaterally, normal respiratory effort. CV.  Regular rate and rhythm, no  JVD, rub or murmur. Abdomen.  Soft, nontender, nondistended, BS positive. CNS.  Alert and oriented .  No focal neurologic deficit. Extremities.  No edema, no cyanosis, pulses intact and symmetrical.  Data Reviewed: Prior data reviewed  Family Communication: Discussed with mother and God daughter at bedside  Disposition: Status is: Inpatient Remains inpatient appropriate because: Severity of illness  Planned Discharge Destination: Home  DVT prophylaxis.  Subcu heparin Time spent: 50 minutes  This record has been created using Conservation officer, historic buildings. Errors have been sought and corrected,but may not always be located. Such creation errors do not reflect on the standard of care.   Author: Arnetha Courser, MD 10/11/2023 3:17 PM  For on call review [email or web address].

## 2023-10-11 NOTE — Consult Note (Signed)
[facility] Face-to-Face Psychiatry Consult   Reason for Consult: Depression Referring Physician:Sumayya  Nelson Chimes, MD  Patient Identification: Abigail [name] MRN:  [id number] Principal Diagnosis: Seizure [facility]) Diagnosis:  Principal Problem:   Seizure (HCC) Active Problems:   GERD (gastroesophageal reflux disease)   Anxiety   Chronic pain   Depression   Rheumatoid arthritis (HCC)   Status epilepticus (HCC)   Giant cell arteritis (HCC)   DNR (do not resuscitate)   Protein-calorie malnutrition, severe   Tobacco use disorder   History of seizure   SIRS (systemic inflammatory response syndrome) (HCC)   Aspiration pneumonia (HCC)    HPI:  Abigail [name] is a 58 year old female with history of giant cell arteritis, seizure with status epilepticus, chronic pain, who presents emergency department from home for chief concerns of seizure.   Patient was discharged from the hospital on 10/04/2023, and reportedly has not been taking her prescribed seizure medications.  Today patient was seen in her room for assessment of depression and suicidal ideations.  Patient reports that she came to hospital because of seizure.  Patient reported she ran out of Tegretol but she has been  compliant with Keppra.  Patient reports depressed mood related to her medical conditions.  She said that " I inquired about end of life care multiple time because I did not want to suffer with this pain."  Patient said that she was taking 15 mg of morphine for pain, she reports that she has been started on pain medicine which has helped a little.  Patient denies any active or passive thoughts of harming herself.  She also talked about reversing her DNR status.  Patient said that she wants to get better and live.  She also talked about rehab.  Patient reports that she does not want her whole disability check go to towards rehab payments.  Patient was provided with reassurance and support.  Patient was encouraged to talk to case  manager regarding payment plans.  Past Psychiatric History: Patient reports history of depression.  She reports she is on Cymbalta.  She denies past history of suicide attempt  Risk to Self:  No Risk to Others:  No  Past Medical History:  Past Medical History:  Diagnosis Date   Anxiety    Arthritis    Back pain    Basal cell carcinoma 10/04/2021   R axilla - needs excised 11/28/21   Basal cell carcinoma 10/04/2021   L antecubital excised 11/14/21   Diarrhea 11/12/2016   Fibromyalgia    Generalized abdominal pain 11/12/2016   H. pylori infection    Hyperlipidemia    IBS (irritable bowel syndrome)    Infectious colitis 04/29/2016   Migraines    Moderate dehydration 04/29/2016   Muscle pain    Opioid overdose (HCC) 12/07/2022   Reflux    Seizures (HCC)    Unexplained weight loss 11/12/2016    Past Surgical History:  Procedure Laterality Date   ABDOMINAL HYSTERECTOMY     APPENDECTOMY  2009   C5 FUSION     C6 FUSION     C7 FUSION     COLONOSCOPY  02/2006   COLONOSCOPY WITH PROPOFOL N/A 12/25/2016   Procedure: COLONOSCOPY WITH PROPOFOL;  Surgeon: Earline Mayotte, MD;  Location: [facility] ENDOSCOPY;  Service: Endoscopy;  Laterality: N/A;   ESOPHAGOGASTRODUODENOSCOPY (EGD) WITH PROPOFOL N/A 12/25/2016   Procedure: ESOPHAGOGASTRODUODENOSCOPY (EGD) WITH PROPOFOL;  Surgeon: Earline Mayotte, MD;  Location: [facility] ENDOSCOPY;  Service: Endoscopy;  Laterality: N/A;  FOOT SURGERY     HIP ARTHROPLASTY     L4 FUSION     L5 FUSION     S1 FUSION     Family History:  Family History  Problem Relation Age of Onset   Diabetes Father    Hypertension Father    Family Psychiatric  History: None reported by the patient Social History:  Social History   Substance and Sexual Activity  Alcohol Use Not Currently   Comment: OCCASIONALLY     Social History   Substance and Sexual Activity  Drug Use Not Currently    Social History   Socioeconomic History   Marital status: Divorced     Spouse name: Not on file   Number of children: Not on file   Years of education: Not on file   Highest education level: Not on file  Occupational History   Not on file  Tobacco Use   Smoking status: Every Day    Current packs/day: 1.00    Types: Cigarettes   Smokeless tobacco: Never   Tobacco comments:    ELECTRONIC VAPOR  Substance and Sexual Activity   Alcohol use: Not Currently    Comment: OCCASIONALLY   Drug use: Not Currently   Sexual activity: Not Currently  Other Topics Concern   Not on file  Social History Narrative   Not on file   Social Drivers of Health   Financial Resource Strain: Not on file  Food Insecurity: Patient Declined (10/09/2023)   Hunger Vital Sign    Worried About Running Out of Food in the Last Year: Patient declined    Ran Out of Food in the Last Year: Patient declined  Transportation Needs: Patient Declined (10/09/2023)   PRAPARE - Administrator, Civil Service (Medical): Patient declined    Lack of Transportation (Non-Medical): Patient declined  Physical Activity: Not on file  Stress: Not on file  Social Connections: Socially Isolated (10/09/2023)   Social Connection and Isolation Panel [NHANES]    Frequency of Communication with Friends and Family: More than three times a week    Frequency of Social Gatherings with Friends and Family: Three times a week    Attends Religious Services: Never    Active Member of Clubs or Organizations: No    Attends Banker Meetings: Never    Marital Status: Divorced   Additional Social History:  Patient reports she is on disability, patient reports her godson who is 59 years old lives with the patient and helps patient manage patient's ADLs and medications  Allergies:   Allergies  Allergen Reactions   Nsaids Hives   Tapentadol Swelling, Rash and Other (See Comments)    Nucynta- Made her deathly sick   Cephalexin Other (See Comments)    Reaction not cited   Codeine Other (See  Comments)    Reaction not cited   Darvocet [Propoxyphene N-Acetaminophen] Other (See Comments)    Reaction not cited   Latex Other (See Comments)    Reaction not cited   Silicone Other (See Comments)    Reaction not cited   Sulfa Antibiotics Hives   Tape Other (See Comments)    Reaction not cited   Meloxicam Rash    Labs:  Results for orders placed or performed during the hospital encounter of 10/08/23 (from the past 48 hours)  CBC     Status: Abnormal   Collection Time: 10/10/23  4:40 AM  Result Value Ref Range   WBC 10.6 (H) 4.0 -  10.5 K/uL   RBC 3.24 (L) 3.87 - 5.11 MIL/uL   Hemoglobin 9.6 (L) 12.0 - 15.0 g/dL   HCT 40.9 (L) 81.1 - 91.4 %   MCV 91.0 80.0 - 100.0 fL   MCH 29.6 26.0 - 34.0 pg   MCHC 32.5 30.0 - 36.0 g/dL   RDW 78.2 95.6 - 21.3 %   Platelets 296 150 - 400 K/uL   nRBC 0.0 0.0 - 0.2 %    Comment: Performed at [facility], [address] [place], [place] [address]  Basic metabolic panel     Status: Abnormal   Collection Time: 10/10/23  4:40 AM  Result Value Ref Range   Sodium 136 135 - 145 mmol/L   Potassium 2.7 (LL) 3.5 - 5.1 mmol/L    Comment: CRITICAL RESULT CALLED TO, READ BACK BY AND VERIFIED WITH Rise Patience, RN 10/10/23 8469 MW    Chloride 97 (L) 98 - 111 mmol/L   CO2 31 22 - 32 mmol/L   Glucose, Bld 91 70 - 99 mg/dL    Comment: Glucose reference range applies only to samples taken after fasting for at least 8 hours.   BUN 7 6 - 20 mg/dL   Creatinine, Ser 6.29 (L) 0.44 - 1.00 mg/dL   Calcium 7.4 (L) 8.9 - 10.3 mg/dL   GFR, Estimated >52 >84 mL/min    Comment: (NOTE) Calculated using the CKD-EPI Creatinine Equation (2021)    Anion gap 8 5 - 15    Comment: Performed at [facility], [address] [place], [place] [address]  Magnesium     Status: None   Collection Time: 10/10/23  4:40 AM  Result Value Ref Range   Magnesium 1.9 1.7 - 2.4 mg/dL    Comment: Performed at [facility], [address]  [place], [place] [address]  Basic metabolic panel     Status: Abnormal   Collection Time: 10/11/23  9:40 AM  Result Value Ref Range   Sodium 130 (L) 135 - 145 mmol/L   Potassium 4.6 3.5 - 5.1 mmol/L    Comment: HEMOLYSIS AT THIS LEVEL MAY AFFECT RESULT   Chloride 94 (L) 98 - 111 mmol/L   CO2 27 22 - 32 mmol/L   Glucose, Bld 101 (H) 70 - 99 mg/dL    Comment: Glucose reference range applies only to samples taken after fasting for at least 8 hours.   BUN 6 6 - 20 mg/dL   Creatinine, Ser 2.53 (L) 0.44 - 1.00 mg/dL   Calcium 7.4 (L) 8.9 - 10.3 mg/dL   GFR, Estimated >66 >44 mL/min    Comment: (NOTE) Calculated using the CKD-EPI Creatinine Equation (2021)    Anion gap 8 5 - 15    Comment: Performed at [facility], [address] [place], [place] [address]    Current Facility-Administered Medications  Medication Dose Route Frequency Provider Last Rate Last Admin   acetaminophen (TYLENOL) tablet 650 mg  650 mg Oral Q6H PRN Cox, Amy N, DO   650 mg at 10/11/23 1035   Or   acetaminophen (TYLENOL) suppository 650 mg  650 mg Rectal Q6H PRN Cox, Amy N, DO       amoxicillin-clavulanate (AUGMENTIN) 875-125 MG per tablet 1 tablet  1 tablet Oral Q12H Arnetha Courser, MD   1 tablet at 10/11/23 0856   carbamazepine (TEGRETOL) chewable tablet 100 mg  100 mg Oral BID Arnetha Courser, MD   100 mg at 10/11/23 0856   DULoxetine (  CYMBALTA) DR capsule 60 mg  60 mg Oral Daily Arnetha Courser, MD   60 mg at 10/11/23 0856   enoxaparin (LOVENOX) injection 40 mg  40 mg Subcutaneous Q24H Arnetha Courser, MD   40 mg at 10/10/23 2137   feeding supplement (ENSURE ENLIVE / ENSURE PLUS) liquid 237 mL  237 mL Oral BID BM Arnetha Courser, MD   237 mL at 10/11/23 1107   gabapentin (NEURONTIN) capsule 300 mg  300 mg Oral TID Arnetha Courser, MD       lactated ringers infusion   Intravenous Continuous Arnetha Courser, MD       levETIRAcetam (KEPPRA) tablet 1,500 mg  1,500 mg Oral BID Arnetha Courser, MD   1,500 mg at 10/11/23 1038    lidocaine (LIDODERM) 5 % 2 patch  2 patch Transdermal Q24H Arnetha Courser, MD       LORazepam (ATIVAN) injection 1 mg  1 mg Intravenous PRN Cox, Amy N, DO       melatonin tablet 10 mg  10 mg Oral QHS Cox, Amy N, DO   10 mg at 10/10/23 2136   metoprolol tartrate (LOPRESSOR) injection 5 mg  5 mg Intravenous Q4H PRN Cox, Amy N, DO       mirtazapine (REMERON SOL-TAB) disintegrating tablet 15 mg  15 mg Oral QHS Arnetha Courser, MD       nicotine (NICODERM CQ - dosed in mg/24 hours) patch 14 mg  14 mg Transdermal Daily PRN Cox, Amy N, DO       ondansetron (ZOFRAN) tablet 4 mg  4 mg Oral Q6H PRN Cox, Amy N, DO       Or   ondansetron (ZOFRAN) injection 4 mg  4 mg Intravenous Q6H PRN Cox, Amy N, DO   4 mg at 10/11/23 1035   oxyCODONE (Oxy IR/ROXICODONE) immediate release tablet 5 mg  5 mg Oral Q8H PRN Arnetha Courser, MD       pantoprazole (PROTONIX) EC tablet 40 mg  40 mg Oral Daily Arnetha Courser, MD   40 mg at 10/11/23 1039   senna-docusate (Senokot-S) tablet 1 tablet  1 tablet Oral QHS PRN Cox, Amy N, DO        Musculoskeletal: Strength & Muscle Tone: decreased Gait & Station:  Not evaluated patient was lying in bed     Psychiatric Specialty Exam:  Presentation  General Appearance:  Disheveled  Eye Contact: Fleeting  Speech: Clear and Coherent; Slow  Speech Volume: Decreased  Mood and Affect  Mood: Depressed  Affect: Constricted   Thought Process  Thought Processes: Goal Directed  Descriptions of Associations:Intact  Orientation:Full (Time, Place and Person)  Thought Content:Abstract Reasoning  History of Schizophrenia/Schizoaffective disorder:No Duration of Psychotic Symptoms: NA Hallucinations:Hallucinations: None  Ideas of Reference:None  Suicidal Thoughts:Suicidal Thoughts: No  Homicidal Thoughts:Homicidal Thoughts: No   Sensorium  Memory: Recent Fair  Judgment: Fair  Insight: Fair   Art therapist  Concentration: Fair  Attention  Span: Fair  Recall: [employer] of Knowledge: Fair  Language: Fair   Psychomotor Activity  Psychomotor Activity: Psychomotor Activity: Decreased   Assets  Assets: Communication Skills; Desire for Improvement; Housing; Social Support   Sleep  Sleep: Sleep: Fair   Physical Exam: Physical Exam HENT:     Head: Atraumatic.     Nose: No congestion.  Pulmonary:     Effort: No respiratory distress.  Skin:    General: Skin is warm.  Neurological:     Mental Status: She is alert.  Review of Systems  Constitutional:  Negative for chills and fever.  HENT:  Negative for hearing loss.   Eyes:  Negative for double vision.  Respiratory:  Negative for cough.   Cardiovascular:  Negative for chest pain and palpitations.  Neurological:  Negative for speech change.  Psychiatric/Behavioral:  Positive for depression. Negative for hallucinations, substance abuse and suicidal ideas. The patient is nervous/anxious.    Blood pressure 107/79, pulse 90, temperature 97.8 F (36.6 C), temperature source Oral, resp. rate 20, height 5\' 7"  (1.702 m), weight 68 kg, SpO2 97%. Body mass index is 23.48 kg/m.  Treatment Plan Summary: Abigail [name] is a 58 year old female with history of giant cell arteritis, seizure with status epilepticus, chronic pain, who presents emergency department from home for chief concerns of seizure. Patient was discharged from the hospital on 10/04/2023, and reportedly has not been taking her prescribed seizure medications.  Today patient was seen in her room for assessment of depression and suicidal ideations.  Patient reports that she came to hospital because of seizure.  Patient reported she ran out of Tegretol but she was compliant with Keppra.  Patient reports depressed mood related to her medical conditions.  She said that " I inquired about end of life care multiple time because I did not want to suffer with this pain."   Recommendations: Would recommend  to continue on patient's current antidepressant Cymbalta at 60 mg by mouth daily, may consider increasing it to 90 mg.  Patient is also on Remeron at bedtime to help with depression and insomnia.  At this point in time patient does not meet the criteria for inpatient psychiatry treatment.  Patient is denying any passive or active suicidal thoughts, no intentions or plan.  Will recommend that the patient follows with outpatient provider upon discharge Will recommend patient's pain be treated adequately and referral to pain clinic upon discharge. Patient has requested to reverse her DNR, patient clearly verbalized her choice to receive CPR and resuscitation if warranted.  This was communicated to Dr. Nelson Chimes At this time patient has capacity to make medical decisions.  She is well-oriented to time place, person, and situation.  Patient is able to verbalize her choices based on information provided to her.  Psychiatry consult service will sign off at this time.  Please call back if further assistance is needed   Lewanda Rife, MD 10/11/2023 1:35 PM

## 2023-10-11 NOTE — Plan of Care (Signed)
  Problem: Activity: Goal: Risk for activity intolerance will decrease Outcome: Progressing   Problem: Coping: Goal: Level of anxiety will decrease Outcome: Progressing   Problem: Elimination: Goal: Will not experience complications related to bowel motility Outcome: Progressing Goal: Will not experience complications related to urinary retention Outcome: Progressing   Problem: Nutrition: Goal: Adequate nutrition will be maintained Outcome: Not Progressing

## 2023-10-11 NOTE — Assessment & Plan Note (Signed)
In setting of increased opacity in the right lower lobe, leukocytosis Unasyn is being switched with Augmentin to complete a 5-day course Oxygen therapy with nasal cannula as needed to maintain SpO2 greater than 92% Continuous pulse oximetry

## 2023-10-11 NOTE — Progress Notes (Signed)
Physical Therapy Treatment Patient Details Name: Abigail [name] [facility] MRN: [id number] DOB: 05/02/1966 Today's Date: 10/11/2023   History of Present Illness Pt is a 58 y/o female presented to ED on 10/08/23 for status epilepticus. Admitted for seizure, SIRS, and PNA. PMH: COPD, epilepsy, chronic pain syndrome, chronic hypoxic respiratory failure on 2L, giant cell arteritis, anxiety, RA    PT Comments  Pt with very flat affect and minimally lethargic but level of alertness improved with min verbal and tactile stimuli.  Pt required physical assist with log roll training and with transfers from an elevated surface.  Pt only able to stand 10-15 sec the first attempt before fatiguing and needing to sit.  With encouragement the pt able to stand a second time with assist and to take several very small, shuffling steps at the EOB before requesting to return to sitting secondary to fatigue.  Pt's SpO2 and HR WNL on 2L during the session.  Pt will benefit from continued PT services upon discharge to safely address deficits listed in patient problem list for decreased caregiver assistance and eventual return to PLOF.     If plan is discharge home, recommend the following: A lot of help with bathing/dressing/bathroom;Two people to help with walking and/or transfers;Assistance with cooking/housework;Direct supervision/assist for medications management;Assist for transportation   Can travel by private vehicle     No  Equipment Recommendations  None recommended by PT    Recommendations for Other Services       Precautions / Restrictions Precautions Precautions: Fall Recall of Precautions/Restrictions: Intact Precaution/Restrictions Comments: on 2L O2 Restrictions Weight Bearing Restrictions Per Provider Order: No     Mobility  Bed Mobility Overal bed mobility: Needs Assistance Bed Mobility: Supine to Sit, Rolling Rolling: Mod assist Sidelying to sit: Mod assist       General bed mobility  comments: Log roll training with mod verbal and tactile cues for sequencing and mod A for BLE and trunk control    Transfers Overall transfer level: Needs assistance Equipment used: Rolling walker (2 wheels) Transfers: Sit to/from Stand Sit to Stand: Min assist, From elevated surface           General transfer comment: cues for hand placement and min A to come to full upright position    Ambulation/Gait Ambulation/Gait assistance: Contact guard assist Gait Distance (Feet): 2 Feet Assistive device: Rolling walker (2 wheels) Gait Pattern/deviations: Step-to pattern, Trunk flexed, Decreased step length - right, Decreased step length - left Gait velocity: decreased     General Gait Details: Pt only able to take a max of 2-3 very small, shuffling steps at the EOB before fatiguing and needing to return to sitting   Stairs             Wheelchair Mobility     Tilt Bed    Modified Rankin (Stroke Patients Only)       Balance Overall balance assessment: Needs assistance Sitting-balance support: Feet supported, Bilateral upper extremity supported, Single extremity supported Sitting balance-Leahy Scale: Fair     Standing balance support: Bilateral upper extremity supported, During functional activity, Reliant on assistive device for balance Standing balance-Leahy Scale: Fair                              Hotel manager: No apparent difficulties  Cognition Arousal: Alert Behavior During Therapy: Flat affect   PT - Cognitive impairments: No apparent impairments  Following commands: Intact      Cueing Cueing Techniques: Verbal cues  Exercises Other Exercises Other Exercises: Log roll training    General Comments        Pertinent Vitals/Pain Pain Assessment Pain Assessment: 0-10 Pain Score: 7  Pain Location: back pain Pain Descriptors / Indicators: Sore, Aching Pain Intervention(s):  Monitored during session, RN gave pain meds during session, Repositioned    Home Living                          Prior Function            PT Goals (current goals can now be found in the care plan section) Progress towards PT goals: Progressing toward goals    Frequency    Min 1X/week      PT Plan      Co-evaluation              AM-PAC PT "6 Clicks" Mobility   Outcome Measure  Help needed turning from your back to your side while in a flat bed without using bedrails?: A Lot Help needed moving from lying on your back to sitting on the side of a flat bed without using bedrails?: A Lot Help needed moving to and from a bed to a chair (including a wheelchair)?: A Lot Help needed standing up from a chair using your arms (e.g., wheelchair or bedside chair)?: A Lot Help needed to walk in hospital room?: Total Help needed climbing 3-5 steps with a railing? : Total 6 Click Score: 10    End of Session Equipment Utilized During Treatment: Oxygen;Gait belt Activity Tolerance: Patient limited by fatigue Patient left: Other (comment);with call bell/phone within reach (Pt left sitting at the EOB with nurse and CNA for bathing) Nurse Communication: Mobility status PT Visit Diagnosis: Muscle weakness (generalized) (M62.81);Difficulty in walking, not elsewhere classified (R26.2);Unsteadiness on feet (R26.81)     Time: 1355-1411 PT Time Calculation (min) (ACUTE ONLY): 16 min  Charges:    $Therapeutic Activity: 8-22 mins PT General Charges $$ ACUTE PT VISIT: 1 Visit                    D. Elly Modena PT, DPT 10/11/23, 3:46 PM

## 2023-10-11 NOTE — TOC PASRR Note (Signed)
RE: Abigail [name] Date of Birth: 1965/10/23 Date: 10/11/2023     To Whom It May Concern:   Please be advised that the above-named patient will require a short-term nursing home stay - anticipated 30 days or less for rehabilitation and strengthening.  The plan is for return home

## 2023-10-11 NOTE — Telephone Encounter (Signed)
Copied from CRM [id number]. Topic: Clinical - Home Health Verbal Orders >> Oct 11, 2023  9:31 AM Geroge Baseman wrote: Caller/Agency:[facility]/ Occupational Therapist Callback Number: [phone] Service Requested: Reporting Information Frequency: N/A Any new concerns about the patient? Patient is currently inpatient in the hospital FYI

## 2023-10-11 NOTE — Assessment & Plan Note (Signed)
MOST form in ACP reviewed on admission Patient made herself full code today and revoked DNR

## 2023-10-12 DIAGNOSIS — R569 Unspecified convulsions: Secondary | ICD-10-CM | POA: Diagnosis not present

## 2023-10-12 DIAGNOSIS — R6510 Systemic inflammatory response syndrome (SIRS) of non-infectious origin without acute organ dysfunction: Secondary | ICD-10-CM | POA: Diagnosis not present

## 2023-10-12 DIAGNOSIS — Z87898 Personal history of other specified conditions: Secondary | ICD-10-CM | POA: Diagnosis not present

## 2023-10-12 DIAGNOSIS — J690 Pneumonitis due to inhalation of food and vomit: Secondary | ICD-10-CM | POA: Diagnosis not present

## 2023-10-12 LAB — GLUCOSE, CAPILLARY: Glucose-Capillary: 108 mg/dL — ABNORMAL HIGH (ref 70–99)

## 2023-10-12 NOTE — Plan of Care (Signed)

## 2023-10-12 NOTE — Assessment & Plan Note (Signed)
 Home duloxetine 60 mg daily resumed Dose was increased to 90 mg as advised by psychiatry on 2/21

## 2023-10-12 NOTE — Progress Notes (Signed)
 Progress Note   Patient: Abigail [name] ZHY:[id number] DOB: 06/18/1966 DOA: 10/08/2023     4 DOS: the patient was seen and examined on 10/12/2023   Brief [name] course: [name] is a 58 year old female with history of giant cell arteritis, seizure with status epilepticus, chronic pain, who presents emergency department from home for chief concerns of seizure.  Patient was discharged from the [name] on 10/04/2023, and reportedly has not been taking her prescribed seizure medications.  Vitals in the ED showed temperature of 98.9, respiration rate 16, heart rate 117, blood pressure 121/69, SpO2 of 93% on 3 L nasal cannula.  Serum sodium is 134, potassium 4.2, chloride 91, bicarb 27, BUN of 8, serum creatinine 0.56, EGFR greater than 60, nonfasting blood glucose 122, WBC 18.9, hemoglobin 11.1, platelets of 423.  CK is 35. COVID/influenza A/influenza B/RSV PCR were negative.  ED treatment: Patient was loaded with Keppra and neurology was consulted.  Neurology specialists recommended: Stat EEG, continue Keppra 1500 mg IV twice daily, carbamazepine 100 mg p.o. twice daily (may need NGT for administration), check carbamazepine level, and a stat CTA head and neck with CTP to rule out LVO.  CTA head neck: Read as no proximal intracranial large vessel occlusion or high-grade proximal artery stenosis identified.  2/19: Vital stable, UDS positive for opioids and benzos, labs with potassium of 3.1, improving leukocytosis at 15.2 but all cell line decreased.  Carbamazepine levels less than 2, phenytoin levels less than 2.5 and Keppra levels pending. This is consistent with being noncompliant with her seizure medications resulted in breakthrough seizures. Phenytoin was apparently discontinued previously. Neurology is recommending Keppra 1500 mg twice daily, carbamazepine 100 mg twice daily and Neurontin at 300 mg 4 times daily. Patient will need meds to bed for discharge.  2/20:  Patient seems very depressed and keeps saying that he wants to die so psych evaluation was ordered.  Denies any suicidal thoughts but just very frustrated.  Asking to resume home pain medications as pain interfere with all the activities.  Home p.o. morphine was restarted.  PT is recommending SNF  2/21: Still feeling depressed and lethargic, did not qualify for inpatient psych treatment.  Remeron was added to help with appetite and depression.  Psych is recommending increasing the dose of Cymbalta to 90, mild hyponatremia today, hypokalemia has been resolved.  Morphine switched with low-dose oxycodone at her request stating that morphine is not very helpful, decreasing the frequency of gabapentin as she appears quite sedated.  Also added lidocaine patch to help with backache so we can avoid whole lot of opioids or sedating medications. Diarrhea has been resolved so no GI pathogen panel collected In the presence of psych she made herself full code-CODE STATUS changed.  2/22: Patient still appears depressed and lethargic.  Keeps saying that oxycodone is also not helping.  I explained that we do not want to increase sedating medications as we would like to get her out of bed and participate with therapy.  Awaiting SNF placement.  Assessment and Plan: * Seizure [facility]) Breakthrough seizure likely progressed to status epilepticus. With reported status epilepticus Seizure precaution, fall precaution, aspiration precaution Continue Keppra 1500 mg IV twice daily and carbamazepine 100 mg p.o. twice daily as recommended by neurologist Carbamazepine level is subtherapeutic Likely breakthrough seizure due to being noncompliant Mental status with significant improvement although appears depressed.  History of seizure Seizure precaution  Status epilepticus (HCC) Status post Keppra loading dose of 3000 mg IV in the  ED Continue Keppra 1500 mg IV twice daily, carbamazepine 100 mg p.o. twice daily  Ativan 1 mg  IV as needed for breakthrough seizure, 2 doses ordered with instructions to administer as appropriate and then let provider know  Aspiration pneumonia (HCC) In setting of increased opacity in the right lower lobe, leukocytosis Unasyn is being switched with Augmentin to complete a 5-day course Oxygen therapy with nasal cannula as needed to maintain SpO2 greater than 92% Continuous pulse oximetry  SIRS (systemic inflammatory response syndrome) (HCC) With suspected aspiration pneumonia Pending formal radiologist read on chest x-ray Unasyn per pharmacy has been initiated on admission Incentive spirometry and flutter valve every 2 hours Fall and aspiration precaution  Depression Home duloxetine 60 mg daily resumed, psych is recommending to increase the dose to 90 mg. Psych evaluation was requested as patient was keeps saying that he wants to die but denies any definitive suicidal plan-patient does not need inpatient management. Remeron was also added to help with depression and appetite  Tobacco use disorder As needed nicotine patch ordered  DNR (do not resuscitate) MOST form in ACP reviewed on admission Patient made herself full code today and revoked DNR  Chronic pain PDMP reviewed. Home morphine was restarted yesterday-switching to oxycodone as morphine was causing a lot of sedation and per patient oxycodone helps her better. Cymbalta dose was also been increased to 90 Decreasing the frequency of gabapentin to 3 times daily Lidocaine patch was also ordered  Anxiety Home duloxetine 60 mg daily resumed Dose was increased to 90 mg as advised by psychiatry on 2/21   Subjective: Patient was lying comfortably but still appears quite depressed when seen today.  Per patient oxycodone is also not very helpful.  Tried explaining that we will avoid further sedating medications and she can see an outpatient pain management clinic if needed  Physical Exam: Vitals:   10/11/23 1600 10/11/23  2005 10/12/23 0402 10/12/23 0902  BP: 109/60 (!) 108/59 120/70 125/73  Pulse: 93 88 98 88  Resp: 16 16 17 20   Temp: 98.8 F (37.1 C) 98 F (36.7 C) 99 F (37.2 C) 98.6 F (37 C)  TempSrc: Oral Oral    SpO2: 99% 94% 96% 97%  Weight:      Height:       General.  Frail lady, in no acute distress. Pulmonary.  Lungs clear bilaterally, normal respiratory effort. CV.  Regular rate and rhythm, no JVD, rub or murmur. Abdomen.  Soft, nontender, nondistended, BS positive. CNS.  Alert and oriented .  No focal neurologic deficit. Extremities.  No edema, no cyanosis, pulses intact and symmetrical. Psychiatry.  Appears depressed  Data Reviewed: Prior data reviewed  Family Communication:   Disposition: Status is: Inpatient Remains inpatient appropriate because: Severity of illness  Planned Discharge Destination: Home  DVT prophylaxis.  Subcu heparin Time spent: 50 minutes  This record has been created using Conservation officer, historic buildings. Errors have been sought and corrected,but may not always be located. Such creation errors do not reflect on the standard of care.   Author: Arnetha Courser, MD 10/12/2023 3:12 PM  For on call review [email or web address].

## 2023-10-13 DIAGNOSIS — R569 Unspecified convulsions: Secondary | ICD-10-CM | POA: Diagnosis not present

## 2023-10-13 DIAGNOSIS — F419 Anxiety disorder, unspecified: Secondary | ICD-10-CM

## 2023-10-13 DIAGNOSIS — Z87898 Personal history of other specified conditions: Secondary | ICD-10-CM | POA: Diagnosis not present

## 2023-10-13 DIAGNOSIS — M069 Rheumatoid arthritis, unspecified: Secondary | ICD-10-CM | POA: Diagnosis not present

## 2023-10-13 DIAGNOSIS — J690 Pneumonitis due to inhalation of food and vomit: Secondary | ICD-10-CM | POA: Diagnosis not present

## 2023-10-13 NOTE — Plan of Care (Signed)

## 2023-10-13 NOTE — Progress Notes (Signed)
 Progress Note   Patient: Abigail [name] [facility] ZHY:[id number] DOB: 1966/07/21 DOA: 10/08/2023     5 DOS: the patient was seen and examined on 10/13/2023   Brief hospital course: [name] is a 58 year old female with history of giant cell arteritis, seizure with status epilepticus, chronic pain, who presents emergency department from home for chief concerns of seizure.  Patient was discharged from the hospital on 10/04/2023, and reportedly has not been taking her prescribed seizure medications.  Vitals in the ED showed temperature of 98.9, respiration rate 16, heart rate 117, blood pressure 121/69, SpO2 of 93% on 3 L nasal cannula.  Serum sodium is 134, potassium 4.2, chloride 91, bicarb 27, BUN of 8, serum creatinine 0.56, EGFR greater than 60, nonfasting blood glucose 122, WBC 18.9, hemoglobin 11.1, platelets of 423.  CK is 35. COVID/influenza A/influenza B/RSV PCR were negative.  ED treatment: Patient was loaded with Keppra and neurology was consulted.  Neurology specialists recommended: Stat EEG, continue Keppra 1500 mg IV twice daily, carbamazepine 100 mg p.o. twice daily (may need NGT for administration), check carbamazepine level, and a stat CTA head and neck with CTP to rule out LVO.  CTA head neck: Read as no proximal intracranial large vessel occlusion or high-grade proximal artery stenosis identified.  2/19: Vital stable, UDS positive for opioids and benzos, labs with potassium of 3.1, improving leukocytosis at 15.2 but all cell line decreased.  Carbamazepine levels less than 2, phenytoin levels less than 2.5 and Keppra levels pending. This is consistent with being noncompliant with her seizure medications resulted in breakthrough seizures. Phenytoin was apparently discontinued previously. Neurology is recommending Keppra 1500 mg twice daily, carbamazepine 100 mg twice daily and Neurontin at 300 mg 4 times daily. Patient will need meds to bed for discharge.  2/20:  Patient seems very depressed and keeps saying that he wants to die so psych evaluation was ordered.  Denies any suicidal thoughts but just very frustrated.  Asking to resume home pain medications as pain interfere with all the activities.  Home p.o. morphine was restarted.  PT is recommending SNF  2/21: Still feeling depressed and lethargic, did not qualify for inpatient psych treatment.  Remeron was added to help with appetite and depression.  Psych is recommending increasing the dose of Cymbalta to 90, mild hyponatremia today, hypokalemia has been resolved.  Morphine switched with low-dose oxycodone at her request stating that morphine is not very helpful, decreasing the frequency of gabapentin as she appears quite sedated.  Also added lidocaine patch to help with backache so we can avoid whole lot of opioids or sedating medications. Diarrhea has been resolved so no GI pathogen panel collected In the presence of psych she made herself full code-CODE STATUS changed.  2/22: Patient still appears depressed and lethargic.  Keeps saying that oxycodone is also not helping.  I explained that we do not want to increase sedating medications as we would like to get her out of bed and participate with therapy.  Awaiting SNF placement.  2/24: Hemodynamically remained stable-awaiting SNF Need to be encouraged out of bed.  Assessment and Plan: * Seizure [facility]) Breakthrough seizure likely progressed to status epilepticus. With reported status epilepticus Seizure precaution, fall precaution, aspiration precaution Continue Keppra 1500 mg IV twice daily and carbamazepine 100 mg p.o. twice daily as recommended by neurologist Carbamazepine level is subtherapeutic Likely breakthrough seizure due to being noncompliant Mental status with significant improvement although appears depressed.  History of seizure Seizure precaution  Status  epilepticus (HCC) Status post Keppra loading dose of 3000 mg IV in the  ED Continue Keppra 1500 mg IV twice daily, carbamazepine 100 mg p.o. twice daily  Ativan 1 mg IV as needed for breakthrough seizure, 2 doses ordered with instructions to administer as appropriate and then let provider know  Aspiration pneumonia (HCC) In setting of increased opacity in the right lower lobe, leukocytosis Unasyn is being switched with Augmentin to complete a 5-day course Oxygen therapy with nasal cannula as needed to maintain SpO2 greater than 92% Continuous pulse oximetry  SIRS (systemic inflammatory response syndrome) (HCC) With suspected aspiration pneumonia Pending formal radiologist read on chest x-ray Unasyn per pharmacy has been initiated on admission Incentive spirometry and flutter valve every 2 hours Fall and aspiration precaution  Depression Home duloxetine 60 mg daily resumed, psych is recommending to increase the dose to 90 mg. Psych evaluation was requested as patient was keeps saying that he wants to die but denies any definitive suicidal plan-patient does not need inpatient management. Remeron was also added to help with depression and appetite  Tobacco use disorder As needed nicotine patch ordered  DNR (do not resuscitate) MOST form in ACP reviewed on admission Patient made herself full code today and revoked DNR  Chronic pain PDMP reviewed. Home morphine was restarted yesterday-switching to oxycodone as morphine was causing a lot of sedation and per patient oxycodone helps her better. Cymbalta dose was also been increased to 90 Decreasing the frequency of gabapentin to 3 times daily Lidocaine patch was also ordered  Anxiety Home duloxetine 60 mg daily resumed Dose was increased to 90 mg as advised by psychiatry on 2/21   Subjective: Patient remained in bed and most of the time refusing to get out of it.  She was asking to set up her hospital bed table with breakfast.  Physical Exam: Vitals:   10/12/23 1711 10/12/23 1925 10/13/23 0433  10/13/23 0913  BP: 120/68 131/86 128/68 120/76  Pulse: 92 95 88 (!) 106  Resp: 20 16 14 18   Temp: (!) 97.5 F (36.4 C) 97.9 F (36.6 C) (!) 97.2 F (36.2 C) 97.8 F (36.6 C)  TempSrc:  Oral    SpO2: 93% 97% 97% 94%  Weight:      Height:       General.  Frail lady, in no acute distress. Pulmonary.  Lungs clear bilaterally, normal respiratory effort. CV.  Regular rate and rhythm, no JVD, rub or murmur. Abdomen.  Soft, nontender, nondistended, BS positive. CNS.  Alert and oriented .  No focal neurologic deficit. Extremities.  No edema, no cyanosis, pulses intact and symmetrical. Psychiatry.  Still appears depressed  Data Reviewed: Prior data reviewed  Family Communication:   Disposition: Status is: Inpatient Remains inpatient appropriate because: Severity of illness  Planned Discharge Destination: Home  DVT prophylaxis.  Subcu heparin Time spent: 45 minutes  This record has been created using Conservation officer, historic buildings. Errors have been sought and corrected,but may not always be located. Such creation errors do not reflect on the standard of care.   Author: Arnetha Courser, MD 10/13/2023 12:42 PM  For on call review [email or web address].

## 2023-10-13 NOTE — Progress Notes (Signed)
 Physical Therapy Treatment Patient Details Name: Abigail [name] MRN: [id number] DOB: [birth year] Today's Date: 10/13/2023   History of Present Illness Pt is a 58 y/o female presented to ED on 10/08/23 for status epilepticus. Admitted for seizure, SIRS, and PNA. PMH: COPD, epilepsy, chronic pain syndrome, chronic hypoxic respiratory failure on 2L, giant cell arteritis, anxiety, RA    PT Comments  Pt bed.  Initially keeping eyes closed but does open. with encouragement and supine AAROM  she is assisted to EOB with bed features and heavy encouragement but only needs min a x 1 and increased time.  Once sitting she is steady in sitting but leans forward on her elbows resting on her knees.  After about 2 minutes she self initiates return to supine but is able to reposition herself with rails.  Pads changed and pericare as she is soiled.    Pt c/o back pain limiting mobility.  During transitions supine/sit and moving up in bed using her arms to pull she does not demonstrate typical back pain behaviors such as guarded movement or flinching/grimmacing.      If plan is discharge home, recommend the following: A lot of help with bathing/dressing/bathroom;Two people to help with walking and/or transfers;Assistance with cooking/housework;Direct supervision/assist for medications management;Assist for transportation   Can travel by private vehicle        Equipment Recommendations  None recommended by PT    Recommendations for Other Services       Precautions / Restrictions Precautions Precautions: Fall Recall of Precautions/Restrictions: Intact Precaution/Restrictions Comments: on 2L O2 Restrictions Weight Bearing Restrictions Per Provider Order: No     Mobility  Bed Mobility Overal bed mobility: Needs Assistance Bed Mobility: Supine to Sit, Sit to Supine, Rolling Rolling: Min assist   Supine to sit: Min assist, Mod assist Sit to supine: Min assist, Mod assist   General bed  mobility comments: heavy cues and encouragement but does participate with care    Transfers Overall transfer level: Needs assistance Equipment used: Rolling walker (2 wheels)               General transfer comment: refuses and returns to supine self initiated    Ambulation/Gait                   Stairs             Wheelchair Mobility     Tilt Bed    Modified Rankin (Stroke Patients Only)       Balance Overall balance assessment: Needs assistance Sitting-balance support: Feet supported, Bilateral upper extremity supported Sitting balance-Leahy Scale: Fair Sitting balance - Comments: leans on knees but needs no assist once up                                    Communication Communication Communication: No apparent difficulties  Cognition Arousal: Alert Behavior During Therapy: Flat affect   PT - Cognitive impairments: No apparent impairments                       PT - Cognition Comments: encouragement to engage but does interact and laughs a bit during session        Cueing Cueing Techniques: Verbal cues, Tactile cues  Exercises Other Exercises Other Exercises: supine AAROM to awaken for session    General Comments        Pertinent Vitals/Pain Pain Assessment  Pain Assessment: 0-10 Pain Score: 6  Pain Location: reports back pain but does well transitioning supine/sit and scooting up in bed withut grimmacing or pain behaviors Pain Descriptors / Indicators: Sore, Aching Pain Intervention(s): Monitored during session, Repositioned    Home Living                          Prior Function            PT Goals (current goals can now be found in the care plan section) Progress towards PT goals: Progressing toward goals    Frequency    Min 1X/week      PT Plan      Co-evaluation              AM-PAC PT "6 Clicks" Mobility   Outcome Measure  Help needed turning from your back to your  side while in a flat bed without using bedrails?: A Little Help needed moving from lying on your back to sitting on the side of a flat bed without using bedrails?: A Little Help needed moving to and from a bed to a chair (including a wheelchair)?: A Lot Help needed standing up from a chair using your arms (e.g., wheelchair or bedside chair)?: A Lot Help needed to walk in hospital room?: A Lot Help needed climbing 3-5 steps with a railing? : Total 6 Click Score: 13    End of Session Equipment Utilized During Treatment: Oxygen Activity Tolerance: Patient tolerated treatment well Patient left: Other (comment);with call bell/phone within reach;with family/visitor present;in bed;with bed alarm set Nurse Communication: Mobility status PT Visit Diagnosis: Muscle weakness (generalized) (M62.81);Difficulty in walking, not elsewhere classified (R26.2);Unsteadiness on feet (R26.81)     Time: 1350-1405 PT Time Calculation (min) (ACUTE ONLY): 15 min  Charges:    $Therapeutic Activity: 8-22 mins PT General Charges $$ ACUTE PT VISIT: 1 Visit                    Danielle Dess 10/13/2023, 2:25 PM

## 2023-10-14 DIAGNOSIS — R9431 Abnormal electrocardiogram [ECG] [EKG]: Secondary | ICD-10-CM | POA: Diagnosis not present

## 2023-10-14 DIAGNOSIS — Z87898 Personal history of other specified conditions: Secondary | ICD-10-CM | POA: Diagnosis not present

## 2023-10-14 DIAGNOSIS — G40901 Epilepsy, unspecified, not intractable, with status epilepticus: Secondary | ICD-10-CM | POA: Diagnosis not present

## 2023-10-14 DIAGNOSIS — J690 Pneumonitis due to inhalation of food and vomit: Secondary | ICD-10-CM | POA: Diagnosis not present

## 2023-10-14 DIAGNOSIS — M069 Rheumatoid arthritis, unspecified: Secondary | ICD-10-CM | POA: Diagnosis not present

## 2023-10-14 LAB — TROPONIN I (HIGH SENSITIVITY): Troponin I (High Sensitivity): 3 ng/L (ref <18)

## 2023-10-14 LAB — GLUCOSE, CAPILLARY: Glucose-Capillary: 119 mg/dL — ABNORMAL HIGH (ref 70–99)

## 2023-10-14 NOTE — Plan of Care (Signed)
  Problem: Education: Goal: Knowledge of General Education information will improve Description: Including pain rating scale, medication(s)/side effects and non-pharmacologic comfort measures Outcome: Progressing   Problem: Nutrition: Goal: Adequate nutrition will be maintained Outcome: Progressing   Problem: Coping: Goal: Level of anxiety will decrease Outcome: Progressing   Problem: Elimination: Goal: Will not experience complications related to bowel motility Outcome: Progressing   Problem: Safety: Goal: Ability to remain free from injury will improve Outcome: Progressing   Problem: Skin Integrity: Goal: Risk for impaired skin integrity will decrease Outcome: Progressing

## 2023-10-14 NOTE — Progress Notes (Signed)
 PIV consult received. No current IV meds. For vessel preservation, will wait until IV meds are needed to replace site. Discussed with Nehemiah Settle, [employer] fundraiser. She will re-enter consult if needs change.

## 2023-10-14 NOTE — Progress Notes (Signed)
 Physical Therapy Treatment Patient Details Name: A[facility]ail [name] [address] MRN: [id number] DOB: September 09, 1965 Today's Date: 10/14/2023   History of Present Illness Pt is a 58 y/o female presented to ED on 10/08/23 for status epilepticus. Admitted for seizure, SIRS, and PNA. PMH: COPD, epilepsy, chronic pain syndrome, chronic hypoxic respiratory failure on 2L, giant cell arteritis, anxiety, RA    PT Comments  Pt agrees with encouragement.  To EOB with min a x 1 and bed features with heavy verbal and tactile cues.  She has improved sitting today, while she leans forward she does not support herself heavily on her knees like yesterday.  She reports needing to have BM.Marland Kitchen  BSC is obtained and she is able to stand with heavy encouragement and min a x 2 and transfer to [facility].  Time given for med loose BM.  She is able to stand for extended time for care with encouragement before transfer back to bed with min a x 1 and RW.  A brief rest in bed before transferring again to recliner at bedside where she remains up after session.  Pt reports feeling weak and no energy.  Education provided in regards to mobility to increase strength and increasing PO intake as she has not touched her breakfast at this time and declined to try it.  She is encouraged to stay up and try to eat lunch.   If plan is discharge home, recommend the following: A lot of help with bathing/dressing/bathroom;Two people to help with walking and/or transfers;Assistance with cooking/housework;Direct supervision/assist for medications management;Assist for transportation   Can travel by private vehicle        Equipment Recommendations  None recommended by PT    Recommendations for Other Services       Precautions / Restrictions Precautions Precautions: Fall Recall of Precautions/Restrictions: Intact Precaution/Restrictions Comments: on 2L O2 Restrictions Weight Bearing Restrictions Per Provider Order: No     Mobility  Bed Mobility Overal  bed mobility: Needs Assistance Bed Mobility: Supine to Sit, Sit to Supine, Rolling Rolling: Min assist   Supine to sit: Min assist     General bed mobility comments: heavy cues and encouragement but does participate with care Patient Response: Cooperative  Transfers Overall transfer level: Needs assistance Equipment used: Rolling walker (2 wheels) Transfers: Sit to/from Stand Sit to Stand: Min assist, From elevated surface, +2 safety/equipment           General transfer comment: multiple transfers bed >BSC>.bed-> chair    Ambulation/Gait Ambulation/Gait assistance: Min assist, +2 safety/equipment Gait Distance (Feet): 2 Feet Assistive device: Rolling walker (2 wheels) Gait Pattern/deviations: Step-to pattern, Trunk flexed, Decreased step length - right, Decreased step length - left Gait velocity: decreased     General Gait Details: limited to transfers at this time  appears she could do more if motivated to do so.   Stairs             Wheelchair Mobility     Tilt Bed Tilt Bed Patient Response: Cooperative  Modified Rankin (Stroke Patients Only)       Balance Overall balance assessment: Needs assistance Sitting-balance support: Feet supported, Bilateral upper extremity supported Sitting balance-Leahy Scale: Fair     Standing balance support: Bilateral upper extremity supported, During functional activity, Reliant on assistive device for balance Standing balance-Leahy Scale: Poor Standing balance comment: +2 for safety  Communication    Cognition Arousal: Alert Behavior During Therapy: Flat affect   PT - Cognitive impairments: No apparent impairments                                Cueing    Exercises Other Exercises Other Exercises: to [facility] for BM    General Comments        Pertinent Vitals/Pain Pain Assessment Pain Assessment: No/denies pain Pain Location: no c/o pain during session and  overall seems comfortable despite fatigue Pain Intervention(s): Monitored during session, Repositioned    Home Living                          Prior Function            PT Goals (current goals can now be found in the care plan section) Progress towards PT goals: Progressing toward goals    Frequency    Min 1X/week      PT Plan      Co-evaluation              AM-PAC PT "6 Clicks" Mobility   Outcome Measure  Help needed turning from your back to your side while in a flat bed without using bedrails?: A Little Help needed moving from lying on your back to sitting on the side of a flat bed without using bedrails?: A Little Help needed moving to and from a bed to a chair (including a wheelchair)?: A Lot Help needed standing up from a chair using your arms (e.g., wheelchair or bedside chair)?: A Lot Help needed to walk in hospital room?: A Lot Help needed climbing 3-5 steps with a railing? : Total 6 Click Score: 13    End of Session Equipment Utilized During Treatment: Gait belt;Oxygen Activity Tolerance: Patient limited by fatigue Patient left: in chair;with chair alarm set;with call bell/phone within reach Nurse Communication: Mobility status PT Visit Diagnosis: Muscle weakness (generalized) (M62.81);Difficulty in walking, not elsewhere classified (R26.2);Unsteadiness on feet (R26.81)     Time: 1610-9604 PT Time Calculation (min) (ACUTE ONLY): 22 min  Charges:    $Therapeutic Activity: 8-22 mins PT General Charges $$ ACUTE PT VISIT: 1 Visit                   Danielle Dess, PTA 10/14/23, 11:20 AM

## 2023-10-14 NOTE — Progress Notes (Signed)
 Progress Note   Patient: Abigail [name] DOB: 1966-07-29 DOA: 10/08/2023     6 DOS: the patient was seen and examined on 10/14/2023   Brief hospital course: [name] is a 58 year old female with history of giant cell arteritis, seizure with status epilepticus, chronic pain, who presents emergency department from home for chief concerns of seizure.  Patient was discharged from the hospital on 10/04/2023, and reportedly has not been taking her prescribed seizure medications.  Vitals in the ED showed temperature of 98.9, respiration rate 16, heart rate 117, blood pressure 121/69, SpO2 of 93% on 3 L nasal cannula.  Serum sodium is 134, potassium 4.2, chloride 91, bicarb 27, BUN of 8, serum creatinine 0.56, EGFR greater than 60, nonfasting blood glucose 122, WBC 18.9, hemoglobin 11.1, platelets of 423.  CK is 35. COVID/influenza A/influenza B/RSV PCR were negative.  ED treatment: Patient was loaded with Keppra and neurology was consulted.  Neurology specialists recommended: Stat EEG, continue Keppra 1500 mg IV twice daily, carbamazepine 100 mg p.o. twice daily (may need NGT for administration), check carbamazepine level, and a stat CTA head and neck with CTP to rule out LVO.  CTA head neck: Read as no proximal intracranial large vessel occlusion or high-grade proximal artery stenosis identified.  2/19: Vital stable, UDS positive for opioids and benzos, labs with potassium of 3.1, improving leukocytosis at 15.2 but all cell line decreased.  Carbamazepine levels less than 2, phenytoin levels less than 2.5 and Keppra levels pending. This is consistent with being noncompliant with her seizure medications resulted in breakthrough seizures. Phenytoin was apparently discontinued previously. Neurology is recommending Keppra 1500 mg twice daily, carbamazepine 100 mg twice daily and Neurontin at 300 mg 4 times daily. Patient will need meds to bed for discharge.  2/20:  Patient seems very depressed and keeps saying that he wants to die so psych evaluation was ordered.  Denies any suicidal thoughts but just very frustrated.  Asking to resume home pain medications as pain interfere with all the activities.  Home p.o. morphine was restarted.  PT is recommending SNF  2/21: Still feeling depressed and lethargic, did not qualify for inpatient psych treatment.  Remeron was added to help with appetite and depression.  Psych is recommending increasing the dose of Cymbalta to 90, mild hyponatremia today, hypokalemia has been resolved.  Morphine switched with low-dose oxycodone at her request stating that morphine is not very helpful, decreasing the frequency of gabapentin as she appears quite sedated.  Also added lidocaine patch to help with backache so we can avoid whole lot of opioids or sedating medications. Diarrhea has been resolved so no GI pathogen panel collected In the presence of psych she made herself full code-CODE STATUS changed.  2/22: Patient still appears depressed and lethargic.  Keeps saying that oxycodone is also not helping.  I explained that we do not want to increase sedating medications as we would like to get her out of bed and participate with therapy.  Awaiting SNF placement.  2/23: Hemodynamically remained stable-awaiting SNF Need to be encouraged out of bed.  2/24: Remains stable but still appears depressed.  Assessment and Plan: * Seizure [facility]) Breakthrough seizure likely progressed to status epilepticus. With reported status epilepticus Seizure precaution, fall precaution, aspiration precaution Continue Keppra 1500 mg IV twice daily and carbamazepine 100 mg p.o. twice daily as recommended by neurologist Carbamazepine level is subtherapeutic Likely breakthrough seizure due to being noncompliant Mental status with significant improvement although appears depressed.  History of seizure Seizure precaution  Status epilepticus (HCC) Status  post Keppra loading dose of 3000 mg IV in the ED Continue Keppra 1500 mg IV twice daily, carbamazepine 100 mg p.o. twice daily  Ativan 1 mg IV as needed for breakthrough seizure, 2 doses ordered with instructions to administer as appropriate and then let provider know  Aspiration pneumonia (HCC) In setting of increased opacity in the right lower lobe, leukocytosis Unasyn is being switched with Augmentin to complete a 5-day course Oxygen therapy with nasal cannula as needed to maintain SpO2 greater than 92% Continuous pulse oximetry  SIRS (systemic inflammatory response syndrome) (HCC) With suspected aspiration pneumonia Pending formal radiologist read on chest x-ray Unasyn per pharmacy has been initiated on admission Incentive spirometry and flutter valve every 2 hours Fall and aspiration precaution  Depression Home duloxetine 60 mg daily resumed, psych is recommending to increase the dose to 90 mg. Psych evaluation was requested as patient was keeps saying that he wants to die but denies any definitive suicidal plan-patient does not need inpatient management. Remeron was also added to help with depression and appetite  Tobacco use disorder As needed nicotine patch ordered  DNR (do not resuscitate) MOST form in ACP reviewed on admission Patient made herself full code today and revoked DNR  Chronic pain PDMP reviewed. Home morphine was restarted yesterday-switching to oxycodone as morphine was causing a lot of sedation and per patient oxycodone helps her better. Cymbalta dose was also been increased to 90 Decreasing the frequency of gabapentin to 3 times daily Lidocaine patch was also ordered  Anxiety Home duloxetine 60 mg daily resumed Dose was increased to 90 mg as advised by psychiatry on 2/21   Subjective: Patient was lying down in bed when seen today.  We talked about getting out of bed and sit in the chair at least.  Physical Exam: Vitals:   10/13/23 2033 10/14/23  0450 10/14/23 0827 10/14/23 1637  BP: (!) 101/59 107/65 120/61 104/62  Pulse: (!) 101 94 94 87  Resp:   20 18  Temp: 97.9 F (36.6 C) 98.5 F (36.9 C) 98 F (36.7 C) 98.8 F (37.1 C)  TempSrc:    Oral  SpO2: 94% 91% 91% 91%  Weight:      Height:       General.  Frail lady, in no acute distress. Pulmonary.  Lungs clear bilaterally, normal respiratory effort. CV.  Regular rate and rhythm, no JVD, rub or murmur. Abdomen.  Soft, nontender, nondistended, BS positive. CNS.  Alert and oriented .  No focal neurologic deficit. Extremities.  No edema, no cyanosis, pulses intact and symmetrical.  Data Reviewed: Prior data reviewed  Family Communication:   Disposition: Status is: Inpatient Remains inpatient appropriate because: Severity of illness  Planned Discharge Destination: SNF DVT prophylaxis.  Subcu heparin Time spent: 44 minutes  This record has been created using Conservation officer, historic buildings. Errors have been sought and corrected,but may not always be located. Such creation errors do not reflect on the standard of care.   Author: Arnetha Courser, MD 10/14/2023 5:30 PM  For on call review [email or web address].

## 2023-10-14 NOTE — Progress Notes (Signed)
 Current RUE PIC leaking when flushed.  Dr Nelson Chimes notified with response of "no need" to replace.

## 2023-10-15 DIAGNOSIS — J690 Pneumonitis due to inhalation of food and vomit: Secondary | ICD-10-CM | POA: Diagnosis not present

## 2023-10-15 DIAGNOSIS — R9431 Abnormal electrocardiogram [ECG] [EKG]: Secondary | ICD-10-CM | POA: Diagnosis not present

## 2023-10-15 DIAGNOSIS — Z87898 Personal history of other specified conditions: Secondary | ICD-10-CM | POA: Diagnosis not present

## 2023-10-15 DIAGNOSIS — M069 Rheumatoid arthritis, unspecified: Secondary | ICD-10-CM | POA: Diagnosis not present

## 2023-10-15 DIAGNOSIS — G40901 Epilepsy, unspecified, not intractable, with status epilepticus: Secondary | ICD-10-CM | POA: Diagnosis not present

## 2023-10-15 LAB — URINE DRUG SCREEN, QUALITATIVE (ARMC ONLY)
Amphetamines, Ur Screen: NOT DETECTED
Barbiturates, Ur Screen: NOT DETECTED
Benzodiazepine, Ur Scrn: POSITIVE — AB
Cannabinoid 50 Ng, Ur ~~LOC~~: NOT DETECTED
Cocaine Metabolite,Ur ~~LOC~~: NOT DETECTED
MDMA (Ecstasy)Ur Screen: NOT DETECTED
Methadone Scn, Ur: NOT DETECTED
Opiate, Ur Screen: NOT DETECTED
Phencyclidine (PCP) Ur S: NOT DETECTED
Tricyclic, Ur Screen: NOT DETECTED

## 2023-10-15 MED ORDER — KETOROLAC TROMETHAMINE 30 MG/ML IJ SOLN
30.0000 mg | Freq: Three times a day (TID) | INTRAMUSCULAR | Status: DC | PRN
  Administered 2023-10-15 – 2023-10-16 (×2): 30 mg via INTRAVENOUS
  Filled 2023-10-15 (×2): qty 1

## 2023-10-15 MED ORDER — KETOROLAC TROMETHAMINE 30 MG/ML IJ SOLN
30.0000 mg | Freq: Once | INTRAMUSCULAR | Status: AC
  Administered 2023-10-15: 30 mg via INTRAVENOUS
  Filled 2023-10-15: qty 1

## 2023-10-15 MED ORDER — MIRTAZAPINE 15 MG PO TBDP
30.0000 mg | ORAL_TABLET | Freq: Every day | ORAL | Status: DC
  Administered 2023-10-15: 30 mg via ORAL
  Filled 2023-10-15: qty 2

## 2023-10-15 NOTE — Progress Notes (Addendum)
 PT Cancellation Note  Patient Details Name: Abigail [name] [facility] MRN: [id number] DOB: 1965/09/25   Cancelled Treatment:    Reason Eval/Treat Not Completed: Medical issues which prohibited therapy  Chart reviewed prior.  Pt awake and engaged this attempt. Stated she is having continued chest pain but stated "it's not my heart".  Stated she is awaiting some new pain meds but does not feel she can participate at this time.  Assisted off bedpan for very minimal void.  She does state she wants to go home and educated on the way she achieves that is by therapies and spending more time out of bed.  "I'm tired of being sick and being here"  Voices understanding.  Does overall seem to be in a better place today.  Encouraged her to get OOB with nursing staff when she feels able today.  Will return at a later time/date.     Danielle Dess 10/15/2023, 11:18 AM

## 2023-10-15 NOTE — Progress Notes (Signed)
 Progress Note   Patient: Abigail [name] [facility] WUJ:[id number] DOB: 1966/05/24 DOA: 10/08/2023     7 DOS: the patient was seen and examined on 10/15/2023   Brief hospital course: [name] is a 58 year old female with history of giant cell arteritis, seizure with status epilepticus, chronic pain, who presents emergency department from home for chief concerns of seizure.  Patient was discharged from the hospital on 10/04/2023, and reportedly has not been taking her prescribed seizure medications.  Vitals in the ED showed temperature of 98.9, respiration rate 16, heart rate 117, blood pressure 121/69, SpO2 of 93% on 3 L nasal cannula.  Serum sodium is 134, potassium 4.2, chloride 91, bicarb 27, BUN of 8, serum creatinine 0.56, EGFR greater than 60, nonfasting blood glucose 122, WBC 18.9, hemoglobin 11.1, platelets of 423.  CK is 35. COVID/influenza A/influenza B/RSV PCR were negative.  ED treatment: Patient was loaded with Keppra and neurology was consulted.  Neurology specialists recommended: Stat EEG, continue Keppra 1500 mg IV twice daily, carbamazepine 100 mg p.o. twice daily (may need NGT for administration), check carbamazepine level, and a stat CTA head and neck with CTP to rule out LVO.  CTA head neck: Read as no proximal intracranial large vessel occlusion or high-grade proximal artery stenosis identified.  2/19: Vital stable, UDS positive for opioids and benzos, labs with potassium of 3.1, improving leukocytosis at 15.2 but all cell line decreased.  Carbamazepine levels less than 2, phenytoin levels less than 2.5 and Keppra levels pending. This is consistent with being noncompliant with her seizure medications resulted in breakthrough seizures. Phenytoin was apparently discontinued previously. Neurology is recommending Keppra 1500 mg twice daily, carbamazepine 100 mg twice daily and Neurontin at 300 mg 4 times daily. Patient will need meds to bed for discharge.  2/20:  Patient seems very depressed and keeps saying that he wants to die so psych evaluation was ordered.  Denies any suicidal thoughts but just very frustrated.  Asking to resume home pain medications as pain interfere with all the activities.  Home p.o. morphine was restarted.  PT is recommending SNF  2/21: Still feeling depressed and lethargic, did not qualify for inpatient psych treatment.  Remeron was added to help with appetite and depression.  Psych is recommending increasing the dose of Cymbalta to 90, mild hyponatremia today, hypokalemia has been resolved.  Morphine switched with low-dose oxycodone at her request stating that morphine is not very helpful, decreasing the frequency of gabapentin as she appears quite sedated.  Also added lidocaine patch to help with backache so we can avoid whole lot of opioids or sedating medications. Diarrhea has been resolved so no GI pathogen panel collected In the presence of psych she made herself full code-CODE STATUS changed.  2/22: Patient still appears depressed and lethargic.  Keeps saying that oxycodone is also not helping.  I explained that we do not want to increase sedating medications as we would like to get her out of bed and participate with therapy.  Awaiting SNF placement.  2/23: Hemodynamically remained stable-awaiting SNF Need to be encouraged out of bed.  2/24: Remains stable but still appears depressed.  2/25: Overnight some complaint of chest pain, EKG and troponin was negative.  Vitals stable.  Gave 1 dose of Toradol as trying to avoid more narcotics.  Patient refusing to get out of the bed most of the time  Assessment and Plan: * Seizure [facility]) Breakthrough seizure likely progressed to status epilepticus. With reported status epilepticus Seizure precaution,  fall precaution, aspiration precaution Continue Keppra 1500 mg IV twice daily and carbamazepine 100 mg p.o. twice daily as recommended by neurologist Carbamazepine level is  subtherapeutic Likely breakthrough seizure due to being noncompliant Mental status with significant improvement although appears depressed.  History of seizure Seizure precaution  Status epilepticus (HCC) Status post Keppra loading dose of 3000 mg IV in the ED Continue Keppra 1500 mg IV twice daily, carbamazepine 100 mg p.o. twice daily  Ativan 1 mg IV as needed for breakthrough seizure, 2 doses ordered with instructions to administer as appropriate and then let provider know  Aspiration pneumonia (HCC) In setting of increased opacity in the right lower lobe, leukocytosis Unasyn is being switched with Augmentin to complete a 5-day course Oxygen therapy with nasal cannula as needed to maintain SpO2 greater than 92% Continuous pulse oximetry  SIRS (systemic inflammatory response syndrome) (HCC) With suspected aspiration pneumonia Pending formal radiologist read on chest x-ray Unasyn per pharmacy has been initiated on admission Incentive spirometry and flutter valve every 2 hours Fall and aspiration precaution  Depression Home duloxetine 60 mg daily resumed, psych is recommending to increase the dose to 90 mg. Psych evaluation was requested as patient was keeps saying that he wants to die but denies any definitive suicidal plan-patient does not need inpatient management. Remeron was also added to help with depression and appetite  Tobacco use disorder As needed nicotine patch ordered  DNR (do not resuscitate) MOST form in ACP reviewed on admission Patient made herself full code today and revoked DNR  Chronic pain PDMP reviewed. Home morphine was restarted yesterday-switching to oxycodone as morphine was causing a lot of sedation and per patient oxycodone helps her better. Cymbalta dose was also been increased to 90 Decreasing the frequency of gabapentin to 3 times daily Lidocaine patch was also ordered  Anxiety Home duloxetine 60 mg daily resumed Dose was increased to 90 mg  as advised by psychiatry on 2/21   Subjective: Patient was lying down in bed and complaining of left-sided chest pain with deep breathing.  Nonradiating.  No associated factors.  She does not want to get out of bed.  Physical Exam: Vitals:   10/15/23 0602 10/15/23 0914 10/15/23 1047 10/15/23 1511  BP: 114/64 120/61 (!) 110/59 105/60  Pulse: 90 85 78 87  Resp: 18 18 16 16   Temp: 97.9 F (36.6 C) (!) 97.4 F (36.3 C) 98.3 F (36.8 C) 97.9 F (36.6 C)  TempSrc:    Oral  SpO2: 99% 98% 95% 96%  Weight:      Height:       General.  Frail lady, in no acute distress. Pulmonary.  Lungs clear bilaterally, normal respiratory effort. CV.  Regular rate and rhythm, no JVD, rub or murmur. Abdomen.  Soft, nontender, nondistended, BS positive. CNS.  Alert and oriented .  No focal neurologic deficit. Extremities.  No edema, no cyanosis, pulses intact and symmetrical.  Data Reviewed: Prior data reviewed  Family Communication: Discussed with patient  Disposition: Status is: Inpatient Remains inpatient appropriate because: Severity of illness  Planned Discharge Destination: SNF DVT prophylaxis.  Subcu heparin Time spent: 42 minutes  This record has been created using Conservation officer, historic buildings. Errors have been sought and corrected,but may not always be located. Such creation errors do not reflect on the standard of care.   Author: Arnetha Courser, MD 10/15/2023 4:02 PM  For on call review [email or web address].

## 2023-10-15 NOTE — Progress Notes (Signed)
 Patient complained of 10/10 chest pain this shift, provider notified, orders placed for EKG and trop. EKG and troponin were within normal limits.

## 2023-10-15 NOTE — Plan of Care (Signed)
  Problem: Education: Goal: Knowledge of General Education information will improve Description: Including pain rating scale, medication(s)/side effects and non-pharmacologic comfort measures Outcome: Progressing   Problem: Health Behavior/Discharge Planning: Goal: Ability to manage health-related needs will improve Outcome: Progressing   Problem: Clinical Measurements: Goal: Ability to maintain clinical measurements within normal limits will improve Outcome: Progressing Goal: Will remain free from infection Outcome: Progressing Goal: Diagnostic test results will improve Outcome: Progressing Goal: Respiratory complications will improve Outcome: Progressing Goal: Cardiovascular complication will be avoided Outcome: Progressing   Problem: Elimination: Goal: Will not experience complications related to bowel motility Outcome: Progressing Goal: Will not experience complications related to urinary retention Outcome: Progressing   Problem: Pain Managment: Goal: General experience of comfort will improve and/or be controlled Outcome: Progressing   Problem: Safety: Goal: Ability to remain free from injury will improve Outcome: Progressing

## 2023-10-15 NOTE — Plan of Care (Signed)
  Problem: Education: Goal: Knowledge of General Education information will improve Description Including pain rating scale, medication(s)/side effects and non-pharmacologic comfort measures Outcome: Progressing   Problem: Elimination: Goal: Will not experience complications related to bowel motility Outcome: Progressing Goal: Will not experience complications related to urinary retention Outcome: Progressing   Problem: Safety: Goal: Ability to remain free from injury will improve Outcome: Progressing   Problem: Skin Integrity: Goal: Risk for impaired skin integrity will decrease Outcome: Progressing

## 2023-10-15 NOTE — [place] FL2 (Signed)
 [place] MEDICAID FL2 LEVEL OF CARE FORM     IDENTIFICATION  Patient Name: Abigail [name] [facility] Birthdate: 04-20-1966 Sex: female Admission Date (Current Location): 10/08/2023  [facility] and [place] Number:  Chiropodist and Address:  [facility], [address] [place], [place] [address]      Provider Number: [id number]  Attending Physician Name and Address:  Arnetha Courser, MD  Relative Name and Phone Number:  Shaylan, Tutton (Mother)  [phone] (    Current Level of Care: Hospital Recommended Level of Care: Skilled Nursing Facility Prior Approval Number:    Date Approved/Denied:   PASRR Number: Pending  Discharge Plan: SNF    Current Diagnoses: Patient Active Problem List   Diagnosis Date Noted   Aspiration pneumonia (HCC) 10/08/2023   Malnutrition of moderate degree 10/02/2023   Hyponatremia 09/27/2023   Chronic recurrent pancreatitis (HCC) 09/25/2023   Pancreatitis 07/30/2023   Chronic respiratory failure with hypoxia (HCC) 07/29/2023   SIRS (systemic inflammatory response syndrome) (HCC) 07/29/2023   Pancreatic pseudocyst 07/13/2023   History of seizure 06/28/2023   Seizure (HCC) 06/28/2023   Epilepsy (HCC) 06/28/2023   Osteopenia 05/29/2023   Protein-calorie malnutrition, severe 12/21/2022   Giant cell arteritis (HCC) 12/19/2022   DNR (do not resuscitate) 12/19/2022   Status epilepticus (HCC) 12/18/2022   Hypokalemia 12/08/2022   Acute metabolic encephalopathy 12/07/2022   Idiopathic acute pancreatitis without infection or necrosis 08/08/2022   Tobacco use disorder 08/08/2022   Asthma 04/26/2020   Vertigo 04/26/2020   Drug-induced constipation 04/26/2020   Screening for colon cancer 04/26/2020   GERD (gastroesophageal reflux disease) 11/12/2016   Chronic prescription opiate use - thru [facility] in [place], [place] 04/29/2016   Long term current use of immunosuppressive drug 04/29/2016   Neuroma digital nerve  06/04/2013   Depression 02/16/2013   Anxiety 02/13/2013   Chronic pain 02/13/2013   DDD (degenerative disc disease), cervical 02/13/2013   Rheumatoid arthritis (HCC) 02/13/2013    Orientation RESPIRATION BLADDER Height & Weight     Time, Self, Situation, Place  O2 (2L nasal cannula) Continent Weight: 149 lb 14.6 oz (68 kg) Height:  5\' 7"  (170.2 cm)  BEHAVIORAL SYMPTOMS/MOOD NEUROLOGICAL BOWEL NUTRITION STATUS      Continent Diet (see d/c summary)  AMBULATORY STATUS COMMUNICATION OF NEEDS Skin   Extensive Assist Verbally Normal                       Personal Care Assistance Level of Assistance  Bathing, Dressing, Feeding Bathing Assistance: Maximum assistance Feeding assistance: Independent Dressing Assistance: Maximum assistance     Functional Limitations Info  Sight, Speech, Hearing Sight Info: Impaired Hearing Info: Adequate Speech Info: Adequate    SPECIAL CARE FACTORS FREQUENCY  PT (By licensed PT), OT (By licensed OT)     PT Frequency: 5x/week OT Frequency: 5x/week            Contractures Contractures Info: Not present    Additional Factors Info  Code Status, Allergies Code Status Info: full code Allergies Info: Tapentadol, nsaids, Cephalexin, codeine, Darvocet (propoxyphene N-acetaminophen), latex, silicone, Sulfa Antibiotics, tape, Meloxicam           Current Medications (10/15/2023):  This is the current hospital active medication list Current Facility-Administered Medications  Medication Dose Route Frequency Provider Last Rate Last Admin   amoxicillin-clavulanate (AUGMENTIN) 875-125 MG per tablet 1 tablet  1 tablet Oral Q12H Arnetha Courser, MD   1 tablet at 10/14/23 2246  calcium carbonate (TUMS - dosed in mg elemental calcium) chewable tablet 200 mg of elemental calcium  1 tablet Oral BID PRN Arnetha Courser, MD       carbamazepine (TEGRETOL) chewable tablet 100 mg  100 mg Oral BID Arnetha Courser, MD   100 mg at 10/14/23 2247   DULoxetine  (CYMBALTA) DR capsule 90 mg  90 mg Oral Daily Arnetha Courser, MD   90 mg at 10/14/23 1012   enoxaparin (LOVENOX) injection 40 mg  40 mg Subcutaneous Q24H Arnetha Courser, MD   40 mg at 10/14/23 2249   feeding supplement (ENSURE ENLIVE / ENSURE PLUS) liquid 237 mL  237 mL Oral BID BM Arnetha Courser, MD   237 mL at 10/12/23 1442   gabapentin (NEURONTIN) capsule 300 mg  300 mg Oral TID Arnetha Courser, MD   300 mg at 10/14/23 2246   levETIRAcetam (KEPPRA) tablet 1,500 mg  1,500 mg Oral BID Arnetha Courser, MD   1,500 mg at 10/14/23 2246   lidocaine (LIDODERM) 5 % 2 patch  2 patch Transdermal Q24H Arnetha Courser, MD   2 patch at 10/14/23 1733   LORazepam (ATIVAN) injection 1 mg  1 mg Intravenous PRN Cox, Amy N, DO       melatonin tablet 10 mg  10 mg Oral QHS Cox, Amy N, DO   10 mg at 10/14/23 2246   metoprolol tartrate (LOPRESSOR) injection 5 mg  5 mg Intravenous Q4H PRN Cox, Amy N, DO       mirtazapine (REMERON SOL-TAB) disintegrating tablet 15 mg  15 mg Oral QHS Arnetha Courser, MD   15 mg at 10/14/23 2246   nicotine (NICODERM CQ - dosed in mg/24 hours) patch 14 mg  14 mg Transdermal Daily PRN Cox, Amy N, DO       oxyCODONE (Oxy IR/ROXICODONE) immediate release tablet 5 mg  5 mg Oral Q8H PRN Arnetha Courser, MD   5 mg at 10/15/23 0120   pantoprazole (PROTONIX) EC tablet 40 mg  40 mg Oral Daily Arnetha Courser, MD   40 mg at 10/14/23 1012   senna-docusate (Senokot-S) tablet 1 tablet  1 tablet Oral QHS PRN Cox, Amy N, DO         Discharge Medications: Please see discharge summary for a list of discharge medications.  Relevant Imaging Results:  Relevant Lab Results:   Additional Information SSN [id number] [place], [place]

## 2023-10-15 NOTE — TOC Progression Note (Addendum)
 Transition of Care [facility]) - Progression Note    Patient Details  Name: Abigail [name] [facility] MRN: [id number] Date of Birth: 1966/05/05  Transition of Care [facility]) CM/SW Contact  Erin Sons, [place] Phone Number: 10/15/2023, 11:35 AM  Clinical Narrative:     CSW met with pt to discuss SNF recommendation. Pt lives in [place] with her son. She is agreeable to SNF w/u. CSW explained medicare coverage and insurance auth process. Fl2 completed and bed requests sent in hub. CSW is unable to locate pt in [facility] system for PASRR. CSW called [facility] and received electronic message that there is no one available for assistance to call back later. TOC will continue to follow.   1355: CSW called [facility] again and received message "Due to circumstances beyond our control no assistance is available at this time. Please call again later."   Expected Discharge Plan: Skilled Nursing Facility Barriers to Discharge: Insurance Authorization, SNF Pending bed offer  Expected Discharge Plan and Services       Living arrangements for the past 2 months: Skilled Nursing Facility                                       Social Determinants of Health (SDOH) Interventions SDOH Screenings   Food Insecurity: Patient Declined (10/09/2023)  Housing: Patient Declined (10/09/2023)  Transportation Needs: Patient Declined (10/09/2023)  Utilities: Patient Declined (10/09/2023)  Depression (PHQ2-9): High Risk (09/18/2023)  Social Connections: Socially Isolated (10/09/2023)  Tobacco Use: High Risk (10/09/2023)    Readmission Risk Interventions    09/26/2023    4:06 PM 07/31/2023    1:07 PM  Readmission Risk Prevention Plan  Transportation Screening Complete Complete  Medication Review [employer]) Complete Complete  PCP or Specialist appointment within 3-5 days of discharge  Complete  HRI or Home Care Consult  Complete  SW Recovery Care/Counseling Consult Complete Complete  Palliative Care Screening  Not Applicable Not Applicable  Skilled Nursing Facility Not Applicable Not Applicable

## 2023-10-16 ENCOUNTER — Other Ambulatory Visit: Payer: Self-pay

## 2023-10-16 ENCOUNTER — Ambulatory Visit: Payer: Medicare HMO | Admitting: Family Medicine

## 2023-10-16 DIAGNOSIS — R6510 Systemic inflammatory response syndrome (SIRS) of non-infectious origin without acute organ dysfunction: Secondary | ICD-10-CM | POA: Diagnosis not present

## 2023-10-16 DIAGNOSIS — G40901 Epilepsy, unspecified, not intractable, with status epilepticus: Secondary | ICD-10-CM | POA: Diagnosis not present

## 2023-10-16 DIAGNOSIS — Z87898 Personal history of other specified conditions: Secondary | ICD-10-CM | POA: Diagnosis not present

## 2023-10-16 DIAGNOSIS — J690 Pneumonitis due to inhalation of food and vomit: Secondary | ICD-10-CM | POA: Diagnosis not present

## 2023-10-16 MED ORDER — LEVETIRACETAM 750 MG PO TABS
1500.0000 mg | ORAL_TABLET | Freq: Two times a day (BID) | ORAL | 2 refills | Status: DC
  Filled 2023-10-16: qty 120, 30d supply, fill #0

## 2023-10-16 MED ORDER — MIRTAZAPINE 30 MG PO TABS
30.0000 mg | ORAL_TABLET | Freq: Every day | ORAL | 2 refills | Status: DC
  Filled 2023-10-16: qty 30, 30d supply, fill #0

## 2023-10-16 MED ORDER — DULOXETINE HCL 30 MG PO CPEP
90.0000 mg | ORAL_CAPSULE | Freq: Every day | ORAL | 3 refills | Status: DC
  Filled 2023-10-16: qty 90, 30d supply, fill #0

## 2023-10-16 MED ORDER — ENSURE ENLIVE PO LIQD
237.0000 mL | Freq: Two times a day (BID) | ORAL | 12 refills | Status: DC
  Filled 2023-10-16: qty 237, 1d supply, fill #0

## 2023-10-16 MED ORDER — LIDOCAINE 4 % EX PTCH
2.0000 | MEDICATED_PATCH | CUTANEOUS | 0 refills | Status: DC
  Filled 2023-10-16: qty 10, 5d supply, fill #0
  Filled 2023-10-16: qty 30, 15d supply, fill #0

## 2023-10-16 MED ORDER — CARBAMAZEPINE ER 100 MG PO TB12
100.0000 mg | ORAL_TABLET | Freq: Two times a day (BID) | ORAL | 2 refills | Status: DC
  Filled 2023-10-16: qty 60, 30d supply, fill #0

## 2023-10-16 MED ORDER — OXYCODONE HCL 5 MG PO TABS
5.0000 mg | ORAL_TABLET | Freq: Three times a day (TID) | ORAL | 0 refills | Status: DC | PRN
Start: 2023-10-16 — End: 2023-10-26
  Filled 2023-10-16: qty 30, 10d supply, fill #0

## 2023-10-16 NOTE — Plan of Care (Signed)
 Pt D/C home with HH [facility]) follow up. Pt A&OX4. AVS reviewed, follow up questions answered.IV removed,belongings returned. BP 116/68 (BP Location: Left Arm)   Pulse 86   Temp 97.9 F (36.6 C)   Resp 19   Ht 5\' 7"  (1.702 m)   Wt 68 kg   SpO2 95%   BMI 23.48 kg/m  No other questions at this time. Reva Bores 10/16/23 1:53 PM

## 2023-10-16 NOTE — Discharge Summary (Signed)
 Physician Discharge Summary   Patient: Abigail [name] [facility] MRN: [id number] DOB: [birth year]  Admit date:     10/08/2023  Discharge date: 10/16/23  Discharge Physician: Arnetha Courser   PCP: Alease Medina, MD   Recommendations at discharge:  Please obtain CBC and BMP on follow-up Follow-up with primary care provider Follow-up with neurology Follow-up at pain management clinic  Discharge Diagnoses: Principal Problem:   Seizure [facility]) Active Problems:   Status epilepticus (HCC)   History of seizure   Aspiration pneumonia (HCC)   SIRS (systemic inflammatory response syndrome) (HCC)   Rheumatoid arthritis (HCC)   Depression   GERD (gastroesophageal reflux disease)   Anxiety   Chronic pain   Giant cell arteritis (HCC)   DNR (do not resuscitate)   Protein-calorie malnutrition, severe   Tobacco use disorder  Resolved Problems:   * No resolved hospital problems. [facility] Course: Abigail [name] is a 58 year old female with history of giant cell arteritis, seizure with status epilepticus, chronic pain, who presents emergency department from home for chief concerns of seizure.  Patient was discharged from the hospital on 10/04/2023, and reportedly has not been taking her prescribed seizure medications.  Vitals in the ED showed temperature of 98.9, respiration rate 16, heart rate 117, blood pressure 121/69, SpO2 of 93% on 3 L nasal cannula.  Serum sodium is 134, potassium 4.2, chloride 91, bicarb 27, BUN of 8, serum creatinine 0.56, EGFR greater than 60, nonfasting blood glucose 122, WBC 18.9, hemoglobin 11.1, platelets of 423.  CK is 35. COVID/influenza A/influenza B/RSV PCR were negative.  ED treatment: Patient was loaded with Keppra and neurology was consulted.  Neurology specialists recommended: Stat EEG, continue Keppra 1500 mg IV twice daily, carbamazepine 100 mg p.o. twice daily (may need NGT for administration), check carbamazepine level, and a stat CTA head and  neck with CTP to rule out LVO.  CTA head neck: Read as no proximal intracranial large vessel occlusion or high-grade proximal artery stenosis identified.  2/19: Vital stable, UDS positive for opioids and benzos, labs with potassium of 3.1, improving leukocytosis at 15.2 but all cell line decreased.  Carbamazepine levels less than 2, phenytoin levels less than 2.5 and Keppra levels pending. This is consistent with being noncompliant with her seizure medications resulted in breakthrough seizures. Phenytoin was apparently discontinued previously. Neurology is recommending Keppra 1500 mg twice daily, carbamazepine 100 mg twice daily and Neurontin at 300 mg 4 times daily. Patient will need meds to bed for discharge.  2/20: Patient seems very depressed and keeps saying that he wants to die so psych evaluation was ordered.  Denies any suicidal thoughts but just very frustrated.  Asking to resume home pain medications as pain interfere with all the activities.  Home p.o. morphine was restarted.  PT is recommending SNF  2/21: Still feeling depressed and lethargic, did not qualify for inpatient psych treatment.  Remeron was added to help with appetite and depression.  Psych is recommending increasing the dose of Cymbalta to 90, mild hyponatremia today, hypokalemia has been resolved.  Morphine switched with low-dose oxycodone at her request stating that morphine is not very helpful, decreasing the frequency of gabapentin as she appears quite sedated.  Also added lidocaine patch to help with backache so we can avoid whole lot of opioids or sedating medications. Diarrhea has been resolved so no GI pathogen panel collected In the presence of psych she made herself full code-CODE STATUS changed.  2/22: Patient still appears depressed and  lethargic.  Keeps saying that oxycodone is also not helping.  I explained that we do not want to increase sedating medications as we would like to get her out of bed and  participate with therapy.  Awaiting SNF placement.  2/23: Hemodynamically remained stable-awaiting SNF Need to be encouraged out of bed.  2/24: Remains stable but still appears depressed.  2/25: Overnight some complaint of chest pain, EKG and troponin was negative.  Vitals stable.  Gave 1 dose of Toradol as trying to avoid more narcotics.  Patient refusing to get out of the bed most of the time.  2/26: Patient remained medically stable.  Does not want to go to rehab anymore and would like to go back home with home health.  Her son who is not currently working will help. We provided with 1 prescription of oxycodone as that is more helpful than her home morphine tablets per patient.  Home morphine was discontinued.  Patient was instructed to follow-up with her pain management specialist for further assistance.  She should avoid  more opioids or sedating medications if possible.  Hopefully pain management clinic can help with other modalities.  We also increased the home dose of Cymbalta and Remeron to help with her pain and depression.  Patient will continue on current medications.  Need to be compliant with her medications especially seizure medications and follow-up with her providers closely for further management.  Assessment and Plan: * Seizure [facility]) Breakthrough seizure likely progressed to status epilepticus. With reported status epilepticus Seizure precaution, fall precaution, aspiration precaution Continue Keppra 1500 mg IV twice daily and carbamazepine 100 mg p.o. twice daily as recommended by neurologist Carbamazepine level is subtherapeutic Likely breakthrough seizure due to being noncompliant Mental status with significant improvement although appears depressed.  History of seizure Seizure precaution  Status epilepticus (HCC) Status post Keppra loading dose of 3000 mg IV in the ED Continue Keppra 1500 mg IV twice daily, carbamazepine 100 mg p.o. twice daily  Ativan 1 mg IV as  needed for breakthrough seizure, 2 doses ordered with instructions to administer as appropriate and then let provider know  Aspiration pneumonia (HCC) In setting of increased opacity in the right lower lobe, leukocytosis Completed the course of antibiotic Oxygen therapy with nasal cannula as needed to maintain SpO2 greater than 92% Continuous pulse oximetry  SIRS (systemic inflammatory response syndrome) (HCC) With suspected aspiration pneumonia Pending formal radiologist read on chest x-ray Unasyn per pharmacy has been initiated on admission Incentive spirometry and flutter valve every 2 hours Fall and aspiration precaution  Depression Home duloxetine 60 mg daily resumed, psych is recommending to increase the dose to 90 mg. Psych evaluation was requested as patient was keeps saying that he wants to die but denies any definitive suicidal plan-patient does not need inpatient management. Remeron was also added to help with depression and appetite  Tobacco use disorder As needed nicotine patch ordered  DNR (do not resuscitate) MOST form in ACP reviewed on admission Patient made herself full code today and revoked DNR  Chronic pain PDMP reviewed. Home morphine was restarted yesterday-switching to oxycodone as morphine was causing a lot of sedation and per patient oxycodone helps her better. Cymbalta dose was also been increased to 90 Decreasing the frequency of gabapentin to 3 times daily Lidocaine patch was also ordered  Anxiety Dose was increased to 90 mg as advised by psychiatry on 2/21  Pain control - [facility] Controlled Substance Reporting System database was reviewed. and patient was  instructed, not to drive, operate heavy machinery, perform activities at heights, swimming or participation in water activities or provide baby-sitting services while on Pain, Sleep and Anxiety Medications; until their outpatient Physician has advised to do so again. Also recommended to not  to take more than prescribed Pain, Sleep and Anxiety Medications.  Consultants: Neurology.  Psychiatry Procedures performed: None Disposition: Home health Diet recommendation:  Discharge Diet Orders (From admission, onward)     Start     Ordered   10/16/23 0000  Diet - low sodium heart healthy        10/16/23 1232           Regular diet DISCHARGE MEDICATION: Allergies as of 10/16/2023       Reactions   Nsaids Hives   Tapentadol Swelling, Rash, Other (See Comments)   Nucynta- Made her deathly sick   Cephalexin Other (See Comments)   Reaction not cited   Codeine Other (See Comments)   Reaction not cited   Darvocet [propoxyphene N-acetaminophen] Other (See Comments)   Reaction not cited   Latex Other (See Comments)   Reaction not cited   Silicone Other (See Comments)   Reaction not cited   Sulfa Antibiotics Hives   Tape Other (See Comments)   Reaction not cited   Meloxicam Rash        Medication List     STOP taking these medications    morphine 15 MG tablet Commonly known as: MSIR       TAKE these medications    bisacodyl 5 MG EC tablet Generic drug: bisacodyl Take 1 tablet (5 mg total) by mouth daily as needed for moderate constipation.   carbamazepine 100 MG 12 hr tablet Commonly known as: TEGRETOL XR Take 1 tablet (100 mg total) by mouth 2 (two) times daily.   DULoxetine 30 MG capsule Commonly known as: CYMBALTA Take 3 capsules (90 mg total) by mouth daily. Start taking on: October 17, 2023 What changed:  medication strength how much to take   feeding supplement Liqd Take 237 mLs by mouth 2 (two) times daily between meals.   gabapentin 300 MG capsule Commonly known as: NEURONTIN Take 1 capsule (300 mg total) by mouth 4 (four) times daily.   levETIRAcetam 750 MG tablet Commonly known as: KEPPRA Take 2 tablets (1,500 mg total) by mouth 2 (two) times daily.   lidocaine 5 % Commonly known as: LIDODERM Place 2 patches onto the skin  daily. Remove & Discard patch within 12 hours or as directed by MD   Melatonin 10 MG Tabs Take 10 mg by mouth at bedtime.   mirtazapine 30 MG tablet Commonly known as: REMERON Take 1 tablet (30 mg total) by mouth at bedtime. What changed:  medication strength how much to take   modafinil 200 MG tablet Commonly known as: PROVIGIL Take 200 mg by mouth daily.   nicotine 14 mg/24hr patch Commonly known as: NICODERM CQ - dosed in mg/24 hours One patch chest wall daily (okay to substitute generic)   NON FORMULARY Apply 1 Application topically 3 (three) times daily as needed. Rock Salt Cream (Similar to [employer])   ondansetron 8 MG disintegrating tablet Commonly known as: ZOFRAN-ODT Take 1 tablet (8 mg total) by mouth every 8 (eight) hours as needed for nausea.   oxyCODONE 5 MG immediate release tablet Commonly known as: Oxy IR/ROXICODONE Take 1 tablet (5 mg total) by mouth every 8 (eight) hours as needed for moderate pain (pain score 4-6) or severe  pain (pain score 7-10).   pantoprazole 40 MG tablet Commonly known as: PROTONIX Take 40 mg by mouth daily.   polyethylene glycol powder 17 GM/SCOOP powder Commonly known as: GLYCOLAX/MIRALAX Mix 17 g with a beverage and drink once daily.   Stimulant Laxative 8.6-50 MG tablet Generic drug: senna-docusate Take 1 tablet by mouth 2 (two) times daily.        Follow-up Information     [name], [name], MD Follow up.   Specialty: Family Medicine Why: Hospital follow up Contact information: [address] [place] [address] [phone]                Discharge Exam: Ceasar Mons Weights   10/08/23 0719  Weight: 68 kg   General.  Frail lady, in no acute distress. Pulmonary.  Lungs clear bilaterally, normal respiratory effort. CV.  Regular rate and rhythm, no JVD, rub or murmur. Abdomen.  Soft, nontender, nondistended, BS positive. CNS.  Alert and oriented .  No focal neurologic deficit. Extremities.  No edema, no  cyanosis, pulses intact and symmetrical.  Condition at discharge: stable  The results of significant diagnostics from this hospitalization (including imaging, microbiology, ancillary and laboratory) are listed below for reference.   Imaging Studies: DG Abdomen 1 View Result Date: 10/08/2023 CLINICAL DATA:  NG tube placement EXAM: ABDOMEN - 1 VIEW COMPARISON:  CT 09/24/2023 FINDINGS: Enteric tube tip overlies the mid stomach. Upper gas pattern is unremarkable. Excreted contrast in the renal collecting systems. Bibasilar airspace disease and probable pleural effusions IMPRESSION: Enteric tube tip overlies the mid stomach. Electronically Signed   By: Jasmine Pang M.D.   On: 10/08/2023 17:57   DG Chest Portable 1 View Result Date: 10/08/2023 CLINICAL DATA:  eval for aspiration Seizure. EXAM: PORTABLE CHEST 1 VIEW COMPARISON:  Radiograph 07/14/2023, CT 07/15/2023 FINDINGS: Low lung volumes and anti lordotic positioning limit assessment. Bibasilar volume loss with streaky bibasilar opacities, favor atelectasis. Stable heart size and mediastinal contours allowing for differences in technique. No pneumothorax. Difficult to assess for effusion on the current exam. IMPRESSION: Very low lung volumes limiting assessment. Streaky bibasilar opacities are similar to prior exam, favor atelectasis. Electronically Signed   By: Narda Rutherford M.D.   On: 10/08/2023 15:25   EEG adult Result Date: 10/08/2023 [name], MD     10/08/2023 12:19 PM Patient Name: Abigail [name] [facility] MRN: [id number] Epilepsy Attending: [name] Referring Physician/Provider: Caryl Pina, MD Date: 10/08/2023 Duration: 34.42 mins Patient history:  58 yo F brought in after being found seizing by her son at home at about 5 AM this morning. EEG to evaluate for seizure Level of alertness: Awake, asleep AEDs during EEG study: LEV, CBZ Technical aspects: This EEG study was done with scalp electrodes positioned according to the  10-20 International system of electrode placement. Electrical activity was reviewed with band pass filter of 1-70Hz , sensitivity of 7 uV/mm, display speed of 67mm/sec with a 60Hz  notched filter applied as appropriate. EEG data were recorded continuously and digitally stored.  Video monitoring was available and reviewed as appropriate. Description: The posterior dominant rhythm consists of 7 Hz activity of moderate voltage (25-35 uV) seen predominantly in posterior head regions, symmetric and reactive to eye opening and eye closing. Sleep was characterized by vertex waves, sleep spindles (12 to 14 Hz), maximal frontocentral region. EEG showed continuous generalized polymorphic sharply contoured 5 to 7 Hz theta slowing. Hyperventilation and photic stimulation were not performed.   ABNORMALITY - Continuous slow, generalized IMPRESSION:  This study is suggestive of mild to moderate diffuse encephalopathy. No seizures or epileptiform discharges were seen throughout the recording. [name]   CT ANGIO HEAD NECK W WO CM W PERF Result Date: 10/08/2023 CLINICAL DATA:  Provided history: Neuro deficit, acute, stroke suspected. Additional history provided: Seizure with subsequent dense left hemiparesis. EXAM: CT ANGIOGRAPHY HEAD AND NECK CT PERFUSION BRAIN TECHNIQUE: Multidetector CT imaging of the head and neck was performed using the standard protocol during bolus administration of intravenous contrast. Multiplanar CT image reconstructions and MIPs were obtained to evaluate the vascular anatomy. Carotid stenosis measurements (when applicable) are obtained utilizing NASCET criteria, using the distal internal carotid diameter as the denominator. Multiphase CT imaging of the brain was performed following IV bolus contrast injection. Subsequent parametric perfusion maps were calculated using RAPID software. RADIATION DOSE REDUCTION: This exam was performed according to the departmental dose-optimization program which  includes automated exposure control, adjustment of the mA and/or kV according to patient size and/or use of iterative reconstruction technique. CONTRAST:  OMNIPAQUE IOHEXOL 350 MG/ML SOLN COMPARISON:  Non-contrast head CT performed earlier today 10/08/2023. FINDINGS: CTA NECK FINDINGS Aortic arch: Standard aortic branching. The visualized thoracic aorta is normal in caliber. No hemodynamically significant innominate or proximal subclavian artery stenosis. Right carotid system: CCA and ICA patent within the neck without atherosclerotic stenosis. Mild atherosclerotic plaque about the carotid bifurcation and within the proximal ICA. A 50% narrowing of the proximal CCA appears to be related to vessel tortuosity rather than atherosclerotic plaque. Tortuosity of the mid to distal cervical ICA. No evidence of dissection. Left carotid system: CCA and ICA patent within the neck without stenosis. Mild atherosclerotic plaque at the carotid bifurcation. No evidence of dissection. Vertebral arteries: Codominant and patent within the neck without stenosis. Skeleton: Levocurvature of the cervical spine. Partially imaged dextrocurvature of the mid/upper thoracic spine. Prior C5-C7 ACDF. Cervical spondylosis. No acute fracture or aggressive osseous lesion. Other neck: No neck mass or cervical lymphadenopathy. Upper chest: Motion degradation limits evaluation of the imaged lung apices. Atelectasis within the dependent aspect of the right upper lobe at the imaged levels. Review of the MIP images confirms the above findings CTA HEAD FINDINGS Anterior circulation: The intracranial internal carotid arteries are patent. Nonstenotic atherosclerotic plaque within the right ICA cavernous segment The M1 middle cerebral arteries are patent. No M2 proximal branch occlusion or high-grade proximal stenosis. The anterior cerebral arteries are patent. No intracranial aneurysm is identified. Posterior circulation: The intracranial vertebral  arteries are patent. The basilar artery is patent. The posterior cerebral arteries are patent. A small right posterior communicating artery is present. The left posterior communicating artery is diminutive or absent. Venous sinuses: Within the limitations of contrast timing, no convincing thrombus. Anatomic variants: As described. Review of the MIP images confirms the above findings CT Brain Perfusion Findings: Poor quality of the source data/contrast bolus limits reliability of the perfusion software estimates. Within this limitation, findings are as follows. CBF (<30%) Volume: 0mL Perfusion (Tmax>6.0s) volume: 30mL (scattered within the bilateral frontal and temporal lobes). Mismatch Volume: 30mL Infarction Location:None identified. No emergent large vessel occlusion identified. These results were called by telephone at the time of interpretation on 10/08/2023 at 10:10 am to provider ERIC [facility] , who verbally acknowledged these results. IMPRESSION: CTA neck: 1. The common carotid and internal carotid arteries are patent within the neck without neck significant atherosclerotic stenosis. Mild atherosclerotic plaque bilaterally, as described. 50% narrowing of the proximal right common carotid artery appears to  be related to vessel tortuosity rather than atherosclerotic disease. 2. The vertebral arteries are patent within the neck without stenosis or significant atherosclerotic disease. CTA head: 1. No proximal intracranial large vessel occlusion or high-grade proximal arterial stenosis identified. 2. Non-stenotic atherosclerotic plaque within the right ICA cavernous segment. CT perfusion head: 1. Poor quality of the source data/contrast bolus limits reliability of the perfusion software estimates. Within this limitation, findings are as follows. 2. The perfusion software identifies no core infarct. The perfusion software identifies regions of hypoperfused parenchyma, totaling 30 mL, scattered within the bilateral  frontal and temporal lobes (utilizing the Tmax>6 seconds threshold). Reported mismatch volume: 30 mL. Electronically Signed   By: Jackey Loge D.O.   On: 10/08/2023 10:36   CT Head Wo Contrast Result Date: 10/08/2023 CLINICAL DATA:  Status epilepticus. EXAM: CT HEAD WITHOUT CONTRAST TECHNIQUE: Contiguous axial images were obtained from the base of the skull through the vertex without intravenous contrast. RADIATION DOSE REDUCTION: This exam was performed according to the departmental dose-optimization program which includes automated exposure control, adjustment of the mA and/or kV according to patient size and/or use of iterative reconstruction technique. COMPARISON:  09/29/2023 FINDINGS: Brain: No evidence of intracranial hemorrhage, acute infarction, hydrocephalus, extra-axial collection, or mass lesion/mass effect. Vascular:  No hyperdense vessel or other acute findings. Skull: No evidence of fracture or other significant bone abnormality. Sinuses/Orbits: No acute findings. Chronic opacification of left maxillary sinus again noted. Other: None. IMPRESSION: No acute intracranial abnormality. Chronic left maxillary sinus disease. Electronically Signed   By: Danae Orleans M.D.   On: 10/08/2023 08:15   EEG adult Result Date: 09/30/2023 Jefferson Fuel, MD     09/30/2023  9:03 PM Routine EEG Report Adrienne [facility] is a 58 y.o. female with a history of seizure who is undergoing an EEG to evaluate for seizures.  Report: This EEG was acquired with electrodes placed according to the International 10-20 electrode system (including Fp1, Fp2, F3, F4, C3, C4, P3, P4, O1, O2, T3, T4, T5, T6, A1, A2, Fz, Cz, Pz). The following electrodes were missing or displaced: none.  The best background was continuous at approximately 5-6 Hz with overriding beta frequencies. This activity is reactive to stimulation. Drowsiness was manifested by background fragmentation; deeper stages of sleep were identified by K complexes  and sleep spindles. There was no focal slowing. There were generalized periodic discharges with triphasic morphology that waxed and waned, at time in runs of up to 1-1.5 Hz lasting 2-10 seconds. These did not appear epileptiform. There were no definitive interictal epileptiform discharges. There were no electrographic seizures identified. Photic stimulation and hyperventilation were not performed.  Impression and clinical correlation: This EEG was obtained while awake and asleep and is abnormal due to: - Moderate diffuse slowing indicative of global cerebral dysfunction - Triphasic waves most commonly seen in the setting of metabolic derangement. - There were no definitive epileptiform abnormalities were not seen during this recording. This EEG is unchanged from her most recent EEG 07/12/23.  Bing Neighbors, MD [facility] [phone]   CT HEAD WO CONTRAST ( ) Result Date: 09/29/2023 CLINICAL DATA:  Mental status change, unknown cause. EXAM: CT HEAD WITHOUT CONTRAST TECHNIQUE: Contiguous axial images were obtained from the base of the skull through the vertex without intravenous contrast. RADIATION DOSE REDUCTION: This exam was performed according to the departmental dose-optimization program which includes automated exposure control, adjustment of the mA and/or kV according to patient size and/or use of iterative reconstruction technique. COMPARISON:  CT head without contrast 07/12/2023. FINDINGS: Brain: No acute infarct, hemorrhage, or mass lesion is present. No significant white matter lesions are present. Deep brain nuclei are within normal limits. The ventricles are of normal size. No significant extraaxial fluid collection is present. The brainstem and cerebellum are within normal limits. Midline structures are within normal limits. Vascular: No hyperdense vessel or unexpected calcification. Skull: Calvarium is intact. No focal lytic or blastic lesions are present. No significant extracranial  soft tissue lesion is present. Sinuses/Orbits: Chronic opacification and atelectasis of the left maxillary sinus is stable. The paranasal sinuses and mastoid air cells are otherwise clear. The globes and orbits are within normal limits. IMPRESSION: 1. Normal CT appearance of the brain. No acute or focal lesion to explain the patient's symptoms. 2. Chronic opacification and atelectasis of the left maxillary sinus. Electronically Signed   By: Marin Roberts M.D.   On: 09/29/2023 18:07   CT ABDOMEN PELVIS W CONTRAST Result Date: 09/24/2023 CLINICAL DATA:  Pancreatitis suspected. History of recurrent pancreatitis, seizure disorder, chronic pain, and narcotic dependence. EXAM: CT ABDOMEN AND PELVIS WITH CONTRAST TECHNIQUE: Multidetector CT imaging of the abdomen and pelvis was performed using the standard protocol following bolus administration of intravenous contrast. RADIATION DOSE REDUCTION: This exam was performed according to the departmental dose-optimization program which includes automated exposure control, adjustment of the mA and/or kV according to patient size and/or use of iterative reconstruction technique. CONTRAST:  OMNIPAQUE IOHEXOL 300 MG/ML  SOLN COMPARISON:  07/29/2023 FINDINGS: Lower chest: Linear areas of atelectasis or consolidation demonstrated in both lung bases. Hepatobiliary: Prominent diffuse fatty infiltration of the liver. Small subcapsular fluid collections along the left lobe and anterior aspect of the liver. Gallbladder and bile ducts are normal. Pancreas: Pancreatic parenchyma appears atrophic. Large loculated and moderately thick-walled cystic structure in the region of the head of the pancreas measuring 7.6 cm diameter. 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 2.8 cm diameter cyst in the body of the pancreas is also unchanged, likely also pseudocyst. 7.8 cm diameter cyst adjacent to the tail of the pancreas in the gastrosplenic ligament  region. This collection is enlarging since prior study, also likely a pseudocyst. Since the prior study, there is increasing peripancreatic edema with upper abdominal fluid tracking around the liver. New collection in the gastrohepatic ligament measuring 3.6 x 7.6 cm. Changes are consistent with acute on chronic pancreatitis. Spleen: Subcapsular collection in the subdiaphragmatic spleen, likely pseudocyst. Mildly enlarged. Adrenals/Urinary Tract: Adrenal glands are unremarkable. Kidneys are normal, without renal calculi, focal lesion, or hydronephrosis. Bladder is unremarkable. Stomach/Bowel: Stomach, small bowel, and colon are not abnormally distended. Under distention of colon limits evaluation but there appears to be some wall thickening of the colon which may indicate colitis. Appendix is surgically absent. Vascular/Lymphatic: Aortic atherosclerosis. No enlarged abdominal or pelvic lymph nodes. Reproductive: No pelvic mass. Other: Small amount of free fluid in the abdomen and pelvis, likely reactive. Infiltration in the omentum and mesentery, likely edema. No free air. Atrophic abdominal wall musculature appears intact. Musculoskeletal: Left hip arthroplasty. Postoperative fixation of the lower lumbar spine. Lumbar scoliosis convex towards the right. Compression deformities of multiple thoracic and lumbar vertebrae, unchanged. IMPRESSION: 1. Changes of acute on chronic pancreatitis as described above. Multiple peripancreatic collections, increasing in size and number since prior study. Peripancreatic edema. 2. Mesenteric/omental edema and free fluid in the abdomen/pelvis are likely reactive. 3. Prominent diffuse fatty infiltration of the liver. 4. Linear atelectasis in the lung  bases. 5. Aortic atherosclerosis. 6. Decompression limits evaluation but there may be wall thickening in the colon suggesting colitis. Electronically Signed   By: Burman Nieves M.D.   On: 09/24/2023 18:01    Microbiology: Results  for orders placed or performed during the hospital encounter of 10/08/23  Resp panel by RT-PCR (RSV, Flu A&B, Covid) Anterior Nasal Swab     Status: None   Collection Time: 10/08/23  7:22 AM   Specimen: Anterior Nasal Swab  Result Value Ref Range Status   SARS Coronavirus 2 by RT PCR NEGATIVE NEGATIVE Final    Comment: (NOTE) SARS-CoV-2 target nucleic acids are NOT DETECTED.  The SARS-CoV-2 RNA is generally detectable in upper respiratory specimens during the acute phase of infection. The lowest concentration of SARS-CoV-2 viral copies this assay can detect is 138 copies/mL. A negative result does not preclude SARS-Cov-2 infection and should not be used as the sole basis for treatment or other patient management decisions. A negative result may occur with  improper specimen collection/handling, submission of specimen other than nasopharyngeal swab, presence of viral mutation(s) within the areas targeted by this assay, and inadequate number of viral copies(<138 copies/mL). A negative result must be combined with clinical observations, patient history, and epidemiological information. The expected result is Negative.  Fact Sheet for Patients:  BloggerCourse.com  Fact Sheet for Healthcare Providers:  SeriousBroker.it  This test is no t yet approved or cleared by the [place] FDA and  has been authorized for detection and/or diagnosis of SARS-CoV-2 by FDA under an Emergency Use Authorization (EUA). This EUA will remain  in effect (meaning this test can be used) for the duration of the COVID-19 declaration under Section 564(b)(1) of the Act, 21 U.S.C.section 360bbb-3(b)(1), unless the authorization is terminated  or revoked sooner.       Influenza A by PCR NEGATIVE NEGATIVE Final   Influenza B by PCR NEGATIVE NEGATIVE Final    Comment: (NOTE) The Xpert Xpress SARS-CoV-2/FLU/RSV plus assay is intended as an aid in the diagnosis  of influenza from Nasopharyngeal swab specimens and should not be used as a sole basis for treatment. Nasal washings and aspirates are unacceptable for Xpert Xpress SARS-CoV-2/FLU/RSV testing.  Fact Sheet for Patients: BloggerCourse.com  Fact Sheet for Healthcare Providers: SeriousBroker.it  This test is not yet approved or cleared by the [place] FDA and has been authorized for detection and/or diagnosis of SARS-CoV-2 by FDA under an Emergency Use Authorization (EUA). This EUA will remain in effect (meaning this test can be used) for the duration of the COVID-19 declaration under Section 564(b)(1) of the Act, 21 U.S.C. section 360bbb-3(b)(1), unless the authorization is terminated or revoked.     Resp Syncytial Virus by PCR NEGATIVE NEGATIVE Final    Comment: (NOTE) Fact Sheet for Patients: BloggerCourse.com  Fact Sheet for Healthcare Providers: SeriousBroker.it  This test is not yet approved or cleared by the [place] FDA and has been authorized for detection and/or diagnosis of SARS-CoV-2 by FDA under an Emergency Use Authorization (EUA). This EUA will remain in effect (meaning this test can be used) for the duration of the COVID-19 declaration under Section 564(b)(1) of the Act, 21 U.S.C. section 360bbb-3(b)(1), unless the authorization is terminated or revoked.  Performed at [facility], [address] [place], [place] [address]     Labs: CBC: Recent Labs  Lab 10/10/23 0440  WBC 10.6*  HGB 9.6*  HCT 29.5*  MCV 91.0  PLT 296   Basic Metabolic Panel:  Recent Labs  Lab 10/10/23 0440 10/11/23 0940  NA 136 130*  K 2.7* 4.6  CL 97* 94*  CO2 31 27  GLUCOSE 91 101*  BUN 7 6  CREATININE 0.38* 0.36*  CALCIUM 7.4* 7.4*  MG 1.9  --    Liver Function Tests: No results for input(s): "AST", "ALT", "ALKPHOS", "BILITOT", "PROT", "ALBUMIN" in  the last 168 hours. CBG: Recent Labs  Lab 10/12/23 2328 10/14/23 0828  GLUCAP 108* 119*    Discharge time spent: greater than 30 minutes.  This record has been created using Conservation officer, historic buildings. Errors have been sought and corrected,but may not always be located. Such creation errors do not reflect on the standard of care.   Signed: Arnetha Courser, MD [facility] 10/16/2023

## 2023-10-16 NOTE — TOC Progression Note (Signed)
 Transition of Care [facility]) - Progression Note    Patient Details  Name: Abigail [name] MRN: [id number] Date of Birth: [birth year]  Transition of [facility]) CM/SW Contact  Erin Sons, [place] Phone Number: 10/16/2023, 12:41 PM  Clinical Narrative:     CSW met with pt and provided SNF bed offers. Pt does not like her SNF offers and would like to go home with [facility] instead. Pt states she lives at home with her son who does not work. Pt reports that she has a walker, 3 in 1, scooter, WC, and O2 at home. She is agreeable to continue [facility] with [facility] who she is active with. CSW confirmed with [facility] that pt is active with them. Pt states that she will call a friend to pick her up; they will bring oxygen. MD notified.   Expected Discharge Plan: Home w Home Health Services Barriers to Discharge: No Barriers Identified  Expected Discharge Plan and Services       Living arrangements for the past 2 months: Single Family Home Expected Discharge Date: 10/16/23                                     Social Determinants of Health (SDOH) Interventions SDOH Screenings   Food Insecurity: Patient Declined (10/09/2023)  Housing: Patient Declined (10/09/2023)  Transportation Needs: Patient Declined (10/09/2023)  Utilities: Patient Declined (10/09/2023)  Depression (PHQ2-9): High Risk (09/18/2023)  Social Connections: Socially Isolated (10/09/2023)  Tobacco Use: High Risk (10/09/2023)    Readmission Risk Interventions    09/26/2023    4:06 PM 07/31/2023    1:07 PM  Readmission Risk Prevention Plan  Transportation Screening Complete Complete  Medication Review Oceanographer) Complete Complete  PCP or Specialist appointment within 3-5 days of discharge  Complete  HRI or Home Care Consult  Complete  SW Recovery Care/Counseling Consult Complete Complete  Palliative Care Screening Not Applicable Not Applicable  Skilled Nursing Facility Not Applicable Not Applicable

## 2023-10-16 NOTE — Plan of Care (Signed)
?  Problem: Education: ?Goal: Knowledge of General Education information will improve ?Description: Including pain rating scale, medication(s)/side effects and non-pharmacologic comfort measures ?Outcome: Progressing ?  ?Problem: Nutrition: ?Goal: Adequate nutrition will be maintained ?Outcome: Progressing ?  ?Problem: Elimination: ?Goal: Will not experience complications related to bowel motility ?Outcome: Progressing ?Goal: Will not experience complications related to urinary retention ?Outcome: Progressing ?  ?Problem: Safety: ?Goal: Ability to remain free from injury will improve ?Outcome: Progressing ?  ?Problem: Skin Integrity: ?Goal: Risk for impaired skin integrity will decrease ?Outcome: Progressing ?  ?

## 2023-10-17 ENCOUNTER — Telehealth: Payer: Self-pay

## 2023-10-17 NOTE — Telephone Encounter (Signed)
 Copied from CRM [id number]. Topic: General - Other >> Oct 17, 2023 10:11 AM Epimenio Foot F wrote: Reason for CRM: Tonya with [facility] is calling in because Patient is being seen under palliative care services through [facility]. Archie Patten says if there are any questions she can be reached at [phone].

## 2023-10-18 ENCOUNTER — Telehealth: Payer: Self-pay

## 2023-10-18 NOTE — Telephone Encounter (Signed)
 Copied from CRM [id number]. Topic: Clinical - Prescription Issue >> Oct 18, 2023  2:19 PM Antony Haste wrote: Reason for CRM: Victorino Dike the patient's physical therapist states carbamazepine (TEGRETOL XR) 100 MG 12 hr tablet has been sent to the wrong pharmacy. The patient would like this sent to: [id number] - [place], [place] - [address], [place] [address] Phone: [phone]  Fax: [phone]

## 2023-10-18 NOTE — Telephone Encounter (Signed)
 Copied from CRM [id number]. Topic: Clinical - Home Health Verbal Orders >> Oct 18, 2023  2:16 PM Antony Haste wrote: Caller/Agency: Herbie [place] Number: [phone] Service Requested: Physical Therapy Frequency: 1x week for 1 week 2x week for 2 weeks 1x week for 5 weeks  Additional Occupational Therpay evaluation and nursing evalution for her nutrition HomeHealth aid for bathing 1x a week for 6 weeks  Any new concerns about the patient? No

## 2023-10-18 NOTE — Telephone Encounter (Signed)
 Spoke with Victorino Dike and provided approval for verbal orders per Dr. Tawny Asal.

## 2023-10-22 ENCOUNTER — Inpatient Hospital Stay: Payer: Medicare HMO | Admitting: Family Medicine

## 2023-10-22 ENCOUNTER — Telehealth: Payer: Self-pay

## 2023-10-22 NOTE — Telephone Encounter (Signed)
 Copied from CRM [id number]. Topic: Referral - Request for Referral >> Oct 21, 2023  3:59 PM Tiffany S wrote: Did the patient discuss referral with their provider in the last year? No (If No - schedule appointment) (If Yes - send message)  Appointment offered? No  Type of order/referral and detailed reason for visit: OT therapy   Preference of office, provider, location: Office   If referral order, have you been seen by this specialty before? No (If Yes, this issue or another issue? When? Where?  Can we respond through MyChart? Yes

## 2023-10-22 NOTE — Telephone Encounter (Signed)
 Patient will discuss referral in office with provider on today's office visit.

## 2023-10-23 ENCOUNTER — Emergency Department

## 2023-10-23 ENCOUNTER — Inpatient Hospital Stay
Admission: EM | Admit: 2023-10-23 | Discharge: 2023-10-26 | DRG: 640 | Disposition: A | Discharge status: Home Health | Attending: Internal Medicine | Admitting: Internal Medicine

## 2023-10-23 ENCOUNTER — Observation Stay

## 2023-10-23 ENCOUNTER — Other Ambulatory Visit: Payer: Self-pay

## 2023-10-23 DIAGNOSIS — Z888 Allergy status to other drugs, medicaments and biological substances status: Secondary | ICD-10-CM

## 2023-10-23 DIAGNOSIS — Z96649 Presence of unspecified artificial hip joint: Secondary | ICD-10-CM | POA: Diagnosis present

## 2023-10-23 DIAGNOSIS — E874 Mixed disorder of acid-base balance: Secondary | ICD-10-CM | POA: Diagnosis present

## 2023-10-23 DIAGNOSIS — E876 Hypokalemia: Secondary | ICD-10-CM | POA: Diagnosis not present

## 2023-10-23 DIAGNOSIS — Z886 Allergy status to analgesic agent status: Secondary | ICD-10-CM

## 2023-10-23 DIAGNOSIS — E8809 Other disorders of plasma-protein metabolism, not elsewhere classified: Secondary | ICD-10-CM | POA: Diagnosis present

## 2023-10-23 DIAGNOSIS — M069 Rheumatoid arthritis, unspecified: Secondary | ICD-10-CM | POA: Diagnosis present

## 2023-10-23 DIAGNOSIS — M5136 Other intervertebral disc degeneration, lumbar region with discogenic back pain only: Secondary | ICD-10-CM | POA: Diagnosis present

## 2023-10-23 DIAGNOSIS — Z91048 Other nonmedicinal substance allergy status: Secondary | ICD-10-CM

## 2023-10-23 DIAGNOSIS — K8689 Other specified diseases of pancreas: Secondary | ICD-10-CM | POA: Diagnosis present

## 2023-10-23 DIAGNOSIS — Z79891 Long term (current) use of opiate analgesic: Secondary | ICD-10-CM

## 2023-10-23 DIAGNOSIS — J4520 Mild intermittent asthma, uncomplicated: Secondary | ICD-10-CM | POA: Diagnosis present

## 2023-10-23 DIAGNOSIS — Z8249 Family history of ischemic heart disease and other diseases of the circulatory system: Secondary | ICD-10-CM

## 2023-10-23 DIAGNOSIS — Z91148 Patient's other noncompliance with medication regimen for other reason: Secondary | ICD-10-CM

## 2023-10-23 DIAGNOSIS — K580 Irritable bowel syndrome with diarrhea: Secondary | ICD-10-CM | POA: Diagnosis present

## 2023-10-23 DIAGNOSIS — M797 Fibromyalgia: Secondary | ICD-10-CM | POA: Diagnosis present

## 2023-10-23 DIAGNOSIS — K9089 Other intestinal malabsorption: Secondary | ICD-10-CM | POA: Diagnosis present

## 2023-10-23 DIAGNOSIS — Z882 Allergy status to sulfonamides status: Secondary | ICD-10-CM

## 2023-10-23 DIAGNOSIS — K861 Other chronic pancreatitis: Secondary | ICD-10-CM | POA: Diagnosis present

## 2023-10-23 DIAGNOSIS — F17210 Nicotine dependence, cigarettes, uncomplicated: Secondary | ICD-10-CM | POA: Diagnosis present

## 2023-10-23 DIAGNOSIS — J9601 Acute respiratory failure with hypoxia: Principal | ICD-10-CM

## 2023-10-23 DIAGNOSIS — Z885 Allergy status to narcotic agent status: Secondary | ICD-10-CM

## 2023-10-23 DIAGNOSIS — Z79899 Other long term (current) drug therapy: Secondary | ICD-10-CM

## 2023-10-23 DIAGNOSIS — Z981 Arthrodesis status: Secondary | ICD-10-CM

## 2023-10-23 DIAGNOSIS — T426X6A Underdosing of other antiepileptic and sedative-hypnotic drugs, initial encounter: Secondary | ICD-10-CM | POA: Diagnosis present

## 2023-10-23 DIAGNOSIS — J4489 Other specified chronic obstructive pulmonary disease: Secondary | ICD-10-CM | POA: Diagnosis present

## 2023-10-23 DIAGNOSIS — Z85828 Personal history of other malignant neoplasm of skin: Secondary | ICD-10-CM

## 2023-10-23 DIAGNOSIS — J9621 Acute and chronic respiratory failure with hypoxia: Secondary | ICD-10-CM | POA: Diagnosis present

## 2023-10-23 DIAGNOSIS — K219 Gastro-esophageal reflux disease without esophagitis: Secondary | ICD-10-CM | POA: Diagnosis present

## 2023-10-23 DIAGNOSIS — Z881 Allergy status to other antibiotic agents status: Secondary | ICD-10-CM

## 2023-10-23 DIAGNOSIS — G8929 Other chronic pain: Secondary | ICD-10-CM | POA: Diagnosis present

## 2023-10-23 DIAGNOSIS — R262 Difficulty in walking, not elsewhere classified: Secondary | ICD-10-CM | POA: Diagnosis present

## 2023-10-23 DIAGNOSIS — J9811 Atelectasis: Secondary | ICD-10-CM | POA: Diagnosis present

## 2023-10-23 DIAGNOSIS — Z90710 Acquired absence of both cervix and uterus: Secondary | ICD-10-CM

## 2023-10-23 DIAGNOSIS — Z91040 Latex allergy status: Secondary | ICD-10-CM

## 2023-10-23 DIAGNOSIS — Z9981 Dependence on supplemental oxygen: Secondary | ICD-10-CM

## 2023-10-23 DIAGNOSIS — G40909 Epilepsy, unspecified, not intractable, without status epilepticus: Secondary | ICD-10-CM | POA: Diagnosis present

## 2023-10-23 DIAGNOSIS — Z833 Family history of diabetes mellitus: Secondary | ICD-10-CM

## 2023-10-23 LAB — BLOOD GAS, VENOUS
Acid-Base Excess: 22.4 mmol/L — ABNORMAL HIGH (ref 0.0–2.0)
Bicarbonate: 48.7 mmol/L — ABNORMAL HIGH (ref 20.0–28.0)
O2 Saturation: 79.8 %
Patient temperature: 37
pCO2, Ven: 57 mmHg (ref 44–60)
pH, Ven: 7.54 — ABNORMAL HIGH (ref 7.25–7.43)
pO2, Ven: 47 mmHg — ABNORMAL HIGH (ref 32–45)

## 2023-10-23 LAB — CBC
HCT: 34.8 % — ABNORMAL LOW (ref 36.0–46.0)
Hemoglobin: 11.1 g/dL — ABNORMAL LOW (ref 12.0–15.0)
MCH: 29.6 pg (ref 26.0–34.0)
MCHC: 31.9 g/dL (ref 30.0–36.0)
MCV: 92.8 fL (ref 80.0–100.0)
Platelets: 251 10*3/uL (ref 150–400)
RBC: 3.75 MIL/uL — ABNORMAL LOW (ref 3.87–5.11)
RDW: 14.6 % (ref 11.5–15.5)
WBC: 3.9 10*3/uL — ABNORMAL LOW (ref 4.0–10.5)
nRBC: 0 % (ref 0.0–0.2)

## 2023-10-23 LAB — COMPREHENSIVE METABOLIC PANEL
ALT: 22 U/L (ref 0–44)
AST: 43 U/L — ABNORMAL HIGH (ref 15–41)
Albumin: 1.7 g/dL — ABNORMAL LOW (ref 3.5–5.0)
Alkaline Phosphatase: 96 U/L (ref 38–126)
Anion gap: 9 (ref 5–15)
BUN: <5 mg/dL — ABNORMAL LOW (ref 6–20)
CO2: 38 mmol/L — ABNORMAL HIGH (ref 22–32)
Calcium: 7.2 mg/dL — ABNORMAL LOW (ref 8.9–10.3)
Chloride: 90 mmol/L — ABNORMAL LOW (ref 98–111)
Creatinine, Ser: 0.41 mg/dL — ABNORMAL LOW (ref 0.44–1.00)
GFR, Estimated: >60 mL/min (ref >60)
Glucose, Bld: 112 mg/dL — ABNORMAL HIGH (ref 70–99)
Potassium: <2 mmol/L — CL (ref 3.5–5.1)
Sodium: 137 mmol/L (ref 135–145)
Total Bilirubin: 0.5 mg/dL (ref 0.0–1.2)
Total Protein: 5.9 g/dL — ABNORMAL LOW (ref 6.5–8.1)

## 2023-10-23 LAB — MAGNESIUM: Magnesium: 1.7 mg/dL (ref 1.7–2.4)

## 2023-10-23 LAB — BRAIN NATRIURETIC PEPTIDE: B Natriuretic Peptide: 22.3 pg/mL (ref 0.0–100.0)

## 2023-10-23 LAB — LACTIC ACID, PLASMA: Lactic Acid, Venous: 1.4 mmol/L (ref 0.5–1.9)

## 2023-10-23 LAB — TROPONIN I (HIGH SENSITIVITY): Troponin I (High Sensitivity): 7 ng/L (ref <18)

## 2023-10-23 MED ORDER — POLYETHYLENE GLYCOL 3350 17 G PO PACK
17.0000 g | PACK | Freq: Every day | ORAL | Status: DC
  Administered 2023-10-25: 17 g via ORAL
  Filled 2023-10-23 (×3): qty 1

## 2023-10-23 MED ORDER — MIRTAZAPINE 15 MG PO TABS
30.0000 mg | ORAL_TABLET | Freq: Every day | ORAL | Status: DC
  Administered 2023-10-23 – 2023-10-25 (×3): 30 mg via ORAL
  Filled 2023-10-23 (×3): qty 2

## 2023-10-23 MED ORDER — OXYCODONE HCL 5 MG PO TABS
5.0000 mg | ORAL_TABLET | Freq: Four times a day (QID) | ORAL | Status: DC | PRN
  Administered 2023-10-23 – 2023-10-24 (×3): 5 mg via ORAL
  Filled 2023-10-23 (×3): qty 1

## 2023-10-23 MED ORDER — NICOTINE 14 MG/24HR TD PT24
14.0000 mg | MEDICATED_PATCH | Freq: Every day | TRANSDERMAL | Status: DC
  Filled 2023-10-23 (×3): qty 1

## 2023-10-23 MED ORDER — CARBAMAZEPINE ER 100 MG PO TB12
100.0000 mg | ORAL_TABLET | Freq: Two times a day (BID) | ORAL | Status: DC
  Administered 2023-10-23 – 2023-10-26 (×6): 100 mg via ORAL
  Filled 2023-10-23 (×6): qty 1

## 2023-10-23 MED ORDER — POTASSIUM CHLORIDE CRYS ER 20 MEQ PO TBCR
40.0000 meq | EXTENDED_RELEASE_TABLET | ORAL | Status: AC
  Administered 2023-10-23 (×2): 40 meq via ORAL
  Filled 2023-10-23 (×2): qty 2

## 2023-10-23 MED ORDER — ENOXAPARIN SODIUM 40 MG/0.4ML IJ SOSY
40.0000 mg | PREFILLED_SYRINGE | INTRAMUSCULAR | Status: DC
  Administered 2023-10-23 – 2023-10-25 (×3): 40 mg via SUBCUTANEOUS
  Filled 2023-10-23 (×3): qty 0.4

## 2023-10-23 MED ORDER — POTASSIUM CHLORIDE 10 MEQ/100ML IV SOLN
10.0000 meq | Freq: Once | INTRAVENOUS | Status: AC
  Administered 2023-10-23: 10 meq via INTRAVENOUS
  Filled 2023-10-23: qty 100

## 2023-10-23 MED ORDER — ENSURE ENLIVE PO LIQD
237.0000 mL | Freq: Two times a day (BID) | ORAL | Status: DC
  Administered 2023-10-24 – 2023-10-26 (×4): 237 mL via ORAL

## 2023-10-23 MED ORDER — DULOXETINE HCL 30 MG PO CPEP
90.0000 mg | ORAL_CAPSULE | Freq: Every day | ORAL | Status: DC
  Administered 2023-10-24 – 2023-10-26 (×3): 90 mg via ORAL
  Filled 2023-10-23 (×3): qty 3

## 2023-10-23 MED ORDER — FENTANYL CITRATE PF 50 MCG/ML IJ SOSY
50.0000 ug | PREFILLED_SYRINGE | Freq: Once | INTRAMUSCULAR | Status: AC
  Administered 2023-10-23: 50 ug via INTRAVENOUS
  Filled 2023-10-23: qty 1

## 2023-10-23 MED ORDER — GABAPENTIN 300 MG PO CAPS
300.0000 mg | ORAL_CAPSULE | Freq: Four times a day (QID) | ORAL | Status: DC
  Administered 2023-10-23 – 2023-10-26 (×11): 300 mg via ORAL
  Filled 2023-10-23 (×11): qty 1

## 2023-10-23 MED ORDER — MELATONIN 5 MG PO TABS
10.0000 mg | ORAL_TABLET | Freq: Every day | ORAL | Status: DC
  Administered 2023-10-23 – 2023-10-25 (×3): 10 mg via ORAL
  Filled 2023-10-23 (×3): qty 2

## 2023-10-23 MED ORDER — PANTOPRAZOLE SODIUM 40 MG PO TBEC
40.0000 mg | DELAYED_RELEASE_TABLET | Freq: Every day | ORAL | Status: DC
  Administered 2023-10-23 – 2023-10-26 (×4): 40 mg via ORAL
  Filled 2023-10-23 (×4): qty 1

## 2023-10-23 MED ORDER — ONDANSETRON 4 MG PO TBDP
8.0000 mg | ORAL_TABLET | Freq: Three times a day (TID) | ORAL | Status: DC | PRN
  Administered 2023-10-24: 8 mg via ORAL
  Filled 2023-10-23: qty 2

## 2023-10-23 MED ORDER — POLYETHYLENE GLYCOL 3350 17 GM/SCOOP PO POWD
17.0000 g | Freq: Every day | ORAL | Status: DC
  Filled 2023-10-23: qty 119

## 2023-10-23 MED ORDER — MODAFINIL 100 MG PO TABS
200.0000 mg | ORAL_TABLET | Freq: Every day | ORAL | Status: DC
  Administered 2023-10-24 – 2023-10-26 (×3): 200 mg via ORAL
  Filled 2023-10-23: qty 1
  Filled 2023-10-23 (×3): qty 2

## 2023-10-23 MED ORDER — POTASSIUM CHLORIDE CRYS ER 20 MEQ PO TBCR
40.0000 meq | EXTENDED_RELEASE_TABLET | Freq: Once | ORAL | Status: AC
  Administered 2023-10-23: 40 meq via ORAL
  Filled 2023-10-23: qty 2

## 2023-10-23 MED ORDER — OXYCODONE HCL 5 MG PO TABS
5.0000 mg | ORAL_TABLET | Freq: Three times a day (TID) | ORAL | Status: DC | PRN
  Administered 2023-10-23: 5 mg via ORAL
  Filled 2023-10-23: qty 1

## 2023-10-23 MED ORDER — PANCRELIPASE (LIP-PROT-AMYL) 12000-38000 UNITS PO CPEP
12000.0000 [IU] | ORAL_CAPSULE | Freq: Three times a day (TID) | ORAL | Status: DC
  Administered 2023-10-23 – 2023-10-24 (×3): 12000 [IU] via ORAL
  Filled 2023-10-23 (×4): qty 1

## 2023-10-23 MED ORDER — LEVETIRACETAM 500 MG PO TABS
1500.0000 mg | ORAL_TABLET | Freq: Two times a day (BID) | ORAL | Status: DC
  Administered 2023-10-23 – 2023-10-24 (×2): 1500 mg via ORAL
  Filled 2023-10-23 (×2): qty 3

## 2023-10-23 MED ORDER — BISACODYL 5 MG PO TBEC: 5.0000 mg | DELAYED_RELEASE_TABLET | Freq: Every day | ORAL | Status: DC | PRN

## 2023-10-23 NOTE — H&P (Addendum)
 History and Physical    Abigail [name] UEA:[id number] DOB: Feb 16, 1966 DOA: 10/23/2023  PCP: Alease Medina, MD (Confirm with patient/family/NH records and if not entered, this has to be entered at [facility] point of entry) Patient coming from: Home  I have personally briefly reviewed patient's old medical records in [facility]  Chief Complaint: Severe back pain, diarrhea, feeling weak  HPI: Abigail [name] [employer] is a 58 y.o. female with medical history significant of GCA off steroid, seizure with status epilepticus, chronic back pain on narcotics, recurrent pancreatitis, mild intermittent asthma, chronic hypoxic respiratory failure on 2 L continuously, presented with multiple complaints including sudden worsening of back pain, generalized weakness, and ongoing diarrhea.  Symptoms started 3 to 4 days ago, patient suddenly developed worsening of back pain.  At baseline she does have chronic back pain and she had surgery of fusion on L4-L5 and L5-S1 10 years ago.  She has been taking narcotic at home.  Over the weekend she started to have sudden worsening of back pain radiating to left calf and lateral side of left foot, and she has been limping at home because of the pain.  1 week ago she also started to have loose diarrhea, nonsmelly nonbloody denied any abdominal pain.  Today she called EMS, EMS arrived and found patient O2 saturation in 70s on room air. ED Course: Temperature 98.4, heart rate 80, blood pressure 124/68, O2 saturation 70% on room air and stabilized on 2 L 97%.  Chest x-ray showed bilateral lower lobe atelectasis.  Blood work showed K2.0, bicarb 38, VBG 7.54/57/47  Review of Systems: As per HPI otherwise 14 point review of systems negative.    Past Medical History:  Diagnosis Date   Anxiety    Arthritis    Back pain    Basal cell carcinoma 10/04/2021   R axilla - needs excised 11/28/21   Basal cell carcinoma 10/04/2021   L antecubital excised 11/14/21    Diarrhea 11/12/2016   Fibromyalgia    Generalized abdominal pain 11/12/2016   H. pylori infection    Hyperlipidemia    IBS (irritable bowel syndrome)    Infectious colitis 04/29/2016   Migraines    Moderate dehydration 04/29/2016   Muscle pain    Opioid overdose (HCC) 12/07/2022   Reflux    Seizures (HCC)    Unexplained weight loss 11/12/2016    Past Surgical History:  Procedure Laterality Date   ABDOMINAL HYSTERECTOMY     APPENDECTOMY  2009   C5 FUSION     C6 FUSION     C7 FUSION     COLONOSCOPY  02/2006   COLONOSCOPY WITH PROPOFOL N/A 12/25/2016   Procedure: COLONOSCOPY WITH PROPOFOL;  Surgeon: Earline Mayotte, MD;  Location: [facility] ENDOSCOPY;  Service: Endoscopy;  Laterality: N/A;   ESOPHAGOGASTRODUODENOSCOPY (EGD) WITH PROPOFOL N/A 12/25/2016   Procedure: ESOPHAGOGASTRODUODENOSCOPY (EGD) WITH PROPOFOL;  Surgeon: Earline Mayotte, MD;  Location: [facility] ENDOSCOPY;  Service: Endoscopy;  Laterality: N/A;   FOOT SURGERY     HIP ARTHROPLASTY     L4 FUSION     L5 FUSION     S1 FUSION       reports that she has been smoking cigarettes. She has never used smokeless tobacco. She reports that she does not currently use alcohol. She reports that she does not currently use drugs.  Allergies  Allergen Reactions   Nsaids Hives   Tapentadol Swelling, Rash and Other (See Comments)    Nucynta- Made  her deathly sick   Cephalexin Other (See Comments)    Reaction not cited   Codeine Other (See Comments)    Reaction not cited   Darvocet [Propoxyphene N-Acetaminophen] Other (See Comments)    Reaction not cited   Latex Other (See Comments)    Reaction not cited   Silicone Other (See Comments)    Reaction not cited   Sulfa Antibiotics Hives   Tape Other (See Comments)    Reaction not cited   Meloxicam Rash    Family History  Problem Relation Age of Onset   Diabetes Father    Hypertension Father      Prior to Admission medications   Medication Sig Start Date End Date Taking?  Authorizing Provider  bisacodyl (DULCOLAX) 5 MG EC tablet Take 1 tablet (5 mg total) by mouth daily as needed for moderate constipation. 10/04/23   Delfino Lovett, MD  carbamazepine (TEGRETOL XR) 100 MG 12 hr tablet Take 1 tablet (100 mg total) by mouth 2 (two) times daily. 10/16/23 11/15/23  Arnetha Courser, MD  DULoxetine (CYMBALTA) 30 MG capsule Take 3 capsules (90 mg total) by mouth daily. 10/17/23   Arnetha Courser, MD  feeding supplement (ENSURE ENLIVE / ENSURE PLUS) LIQD Take 237 mLs by mouth 2 (two) times daily between meals. 10/16/23   Arnetha Courser, MD  gabapentin (NEURONTIN) 300 MG capsule Take 1 capsule (300 mg total) by mouth 4 (four) times daily. 10/04/23 11/03/23  Delfino Lovett, MD  levETIRAcetam (KEPPRA) 750 MG tablet Take 2 tablets (1,500 mg total) by mouth 2 (two) times daily. 10/16/23 11/15/23  Arnetha Courser, MD  lidocaine 4 % Place 2 patches onto the skin daily. Remove & Discard patch within 12 hours or as directed by MD 10/16/23   Arnetha Courser, MD  Melatonin 10 MG TABS Take 10 mg by mouth at bedtime.    [provider]  mirtazapine (REMERON) 30 MG tablet Take 1 tablet (30 mg total) by mouth at bedtime. 10/16/23   Arnetha Courser, MD  modafinil (PROVIGIL) 200 MG tablet Take 200 mg by mouth daily. 08/07/23   [provider]  nicotine (NICODERM CQ - DOSED IN MG/24 HOURS) 14 mg/24hr patch One patch chest wall daily (okay to substitute generic) 07/15/23   Alford Highland, MD  NON FORMULARY Apply 1 Application topically 3 (three) times daily as needed. Rock Salt Cream (Similar to [employer])    [provider]  ondansetron (ZOFRAN-ODT) 8 MG disintegrating tablet Take 1 tablet (8 mg total) by mouth every 8 (eight) hours as needed for nausea. 09/23/23   [name], [name], MD  oxyCODONE (OXY IR/ROXICODONE) 5 MG immediate release tablet Take 1 tablet (5 mg total) by mouth every 8 (eight) hours as needed for moderate pain (pain score 4-6) or severe pain (pain score 7-10). 10/16/23   Arnetha Courser, MD  pantoprazole (PROTONIX) 40 MG tablet Take 40 mg by mouth daily. 10/02/23   [provider]  polyethylene glycol powder (GLYCOLAX/MIRALAX) 17 GM/SCOOP powder Mix 17 g with a beverage and drink once daily. 10/05/23   Delfino Lovett, MD  senna-docusate (SENOKOT-S) 8.6-50 MG tablet Take 1 tablet by mouth 2 (two) times daily. 10/04/23 11/03/23  Delfino Lovett, MD    Physical Exam: Vitals:   10/23/23 1330 10/23/23 1400 10/23/23 1430 10/23/23 1530  BP: 131/71 123/63 125/66 131/84  Pulse: 86 81 79 96  Resp: 12 14 14 12   Temp:      TempSrc:      SpO2: 97%  95% 95% 91%    Constitutional: NAD, calm, comfortable Vitals:   10/23/23 1330 10/23/23 1400 10/23/23 1430 10/23/23 1530  BP: 131/71 123/63 125/66 131/84  Pulse: 86 81 79 96  Resp: 12 14 14 12   Temp:      TempSrc:      SpO2: 97% 95% 95% 91%   Eyes: PERRL, lids and conjunctivae normal ENMT: Mucous membranes are moist. Posterior pharynx clear of any exudate or lesions.Normal dentition.  Neck: normal, supple, no masses, no thyromegaly Respiratory: clear to auscultation bilaterally, no wheezing, no crackles. Normal respiratory effort. No accessory muscle use.  Cardiovascular: Regular rate and rhythm, no murmurs / rubs / gallops. No extremity edema. 2+ pedal pulses. No carotid bruits.  Abdomen: no tenderness, no masses palpated. No hepatosplenomegaly. Bowel sounds positive.  Musculoskeletal: Severe tenderness on L4-L5 Area Skin: no rashes, lesions, ulcers. No induration Neurologic: CN 2-12 grossly intact. Sensation intact, DTR normal.  Strength 4/4 on right lower extremity below the knee compared to 5/5 on the right side Psychiatric: Normal judgment and insight. Alert and oriented x 3. Normal mood.     Labs on Admission: I have personally reviewed following labs and imaging studies  CBC: Recent Labs  Lab 10/23/23 1144  WBC 3.9*  HGB 11.1*  HCT 34.8*  MCV 92.8  PLT 251   Basic Metabolic Panel: Recent Labs  Lab  10/23/23 1144  NA 137  K <2.0*  CL 90*  CO2 38*  GLUCOSE 112*  BUN <5*  CREATININE 0.41*  CALCIUM 7.2*   GFR: CrCl cannot be calculated (Unknown ideal weight.). Liver Function Tests: Recent Labs  Lab 10/23/23 1144  AST 43*  ALT 22  ALKPHOS 96  BILITOT 0.5  PROT 5.9*  ALBUMIN 1.7*   No results for input(s): "LIPASE", "AMYLASE" in the last 168 hours. No results for input(s): "AMMONIA" in the last 168 hours. Coagulation Profile: No results for input(s): "INR", "PROTIME" in the last 168 hours. Cardiac Enzymes: No results for input(s): "CKTOTAL", "CKMB", "CKMBINDEX", "TROPONINI" in the last 168 hours. BNP (last 3 results) No results for input(s): "PROBNP" in the last 8760 hours. HbA1C: No results for input(s): "HGBA1C" in the last 72 hours. CBG: No results for input(s): "GLUCAP" in the last 168 hours. Lipid Profile: No results for input(s): "CHOL", "HDL", "LDLCALC", "TRIG", "CHOLHDL", "LDLDIRECT" in the last 72 hours. Thyroid Function Tests: No results for input(s): "TSH", "T4TOTAL", "FREET4", "T3FREE", "THYROIDAB" in the last 72 hours. Anemia Panel: No results for input(s): "VITAMINB12", "FOLATE", "FERRITIN", "TIBC", "IRON", "RETICCTPCT" in the last 72 hours. Urine analysis:    Component Value Date/Time   COLORURINE YELLOW (A) 10/08/2023 1301   APPEARANCEUR CLEAR (A) 10/08/2023 1301   APPEARANCEUR Clear 05/24/2014 1903   LABSPEC >1.046 (H) 10/08/2023 1301   LABSPEC 1.015 05/24/2014 1903   PHURINE 6.0 10/08/2023 1301   GLUCOSEU NEGATIVE 10/08/2023 1301   GLUCOSEU Negative 05/24/2014 1903   HGBUR NEGATIVE 10/08/2023 1301   BILIRUBINUR NEGATIVE 10/08/2023 1301   BILIRUBINUR Negative 05/24/2014 1903   KETONESUR 20 (A) 10/08/2023 1301   PROTEINUR NEGATIVE 10/08/2023 1301   NITRITE NEGATIVE 10/08/2023 1301   LEUKOCYTESUR NEGATIVE 10/08/2023 1301   LEUKOCYTESUR Negative 05/24/2014 1903    Radiological Exams on Admission: DG Chest Port 1 View Result Date:  10/23/2023 CLINICAL DATA:  Shortness of breath. EXAM: PORTABLE CHEST 1 VIEW COMPARISON:  October 08, 2023. FINDINGS: The heart size and mediastinal contours are within normal limits. Hypoinflation of the lungs is noted. Mild bibasilar subsegmental atelectasis is  noted. The visualized skeletal structures are unremarkable. IMPRESSION: Hypoinflation of the lungs with mild bibasilar subsegmental atelectasis. Electronically Signed   By: Lupita Raider M.D.   On: 10/23/2023 14:31    EKG: Independently reviewed.  Sinus rhythm, no acute ST changes.  Assessment/Plan Principal Problem:   Hypokalemia Active Problems:   Impaired ambulation  (please populate well all problems here in Problem List. (For example, if patient is on BP meds at home and you resume or decide to hold them, it is a problem that needs to be her. Same for CAD, COPD, HLD and so on)  Acute on chronic hypoxic respiratory failure -Suspect this is due to atelectasis from acute ambulatory impairment secondary to severe back pain.  Hypoxia improved after initial oxygen treatment.  Will add incentive spirometry and encourage activity as tolerated -Other DDx, no acute infiltrates on chest x-ray, low suspicion for pneumonia.  Monitor off antibiotics  Hypokalemia -Likely secondary to subacute diarrhea probably caused by stool softener and laxative. -IV and p.o. replacement, check magnesium level -Discontinue Colace -Other etiologies, patient does have recurrent chronic pancreatitis, clinically suspect pancreatic insufficiency as well.  Will start trial of Creon and recommend patient outpatient follow-up with GI.  Acute on chronic ambulatory impairment -Lumbar spine CT ordered to rule out any fracture -Evaluation PT  Uncompensated combined metabolic alkalosis and respiratory acidosis -Treat diarrhea then reevaluate  COPD/asthma -No symptoms or signs of acute exacerbation  Severe hypoalbuminemia -Check prealbumin level -No history of  liver disease, clinically suspect malabsorption from chronic pancreatic insufficiency. Creon started.  Seizure disorder -No acute concern, continue Keppra  DVT prophylaxis: Lovenox Code Status: Full code Family Communication: None at bedside Disposition Plan: Expect less than 2 midnight hospital stay Consults called: None Admission status: Tele observation   Emeline General MD [facility] Pager [phone]  10/23/2023, 4:34 PM

## 2023-10-23 NOTE — ED Notes (Signed)
 Attempted to call pt sister Vernona Rieger [phone] for pt, no answer.

## 2023-10-23 NOTE — ED Triage Notes (Signed)
 [facility] reports pt coming from home c/o back pain. Upon EMS arrival pt pulse ox 70% on RA. Pt given duo neb and pulse ox came up to 90%. Pt has chronic back pain which she takes opoid's for her back pain.

## 2023-10-23 NOTE — ED Provider Notes (Signed)
 [facility] Provider Note    Event Date/Time   First MD Initiated Contact with Patient 10/23/23 1133     (approximate)   History   Shortness of Breath  HPI  Abigail [name] is a 58 y.o. female with a history of rheumatoid arthritis, seizure disorder, aspiration pneumonia who presents with complaints of shortness of breath.  Patient also has chronic low back pain, she does complain of back pain but found to be hypoxic in the 70s upon arrival.  Medical records reviewed, discharged from our hospital after admission for seizures on October 16, 2023      Physical Exam   Triage Vital Signs: ED Triage Vitals  Encounter Vitals Group     BP 10/23/23 1129 124/68     Systolic BP Percentile --      Diastolic BP Percentile --      Pulse Rate 10/23/23 1129 80     Resp 10/23/23 1129 20     Temp 10/23/23 1129 98.4 F (36.9 C)     Temp Source 10/23/23 1129 Oral     SpO2 10/23/23 1129 (!) 74 %     Weight --      Height --      Head Circumference --      Peak Flow --      Pain Score 10/23/23 1130 (S) 10     Pain Loc --      Pain Education --      Exclude from Growth Chart --     Most recent vital signs: Vitals:   10/23/23 1400 10/23/23 1430  BP: 123/63 125/66  Pulse: 81 79  Resp: 14 14  Temp:    SpO2: 95% 95%     General: Awake, no distress.  CV:  Good peripheral perfusion.  Resp:  Mild tachypnea, bibasilar Rales Abd:  No distention.  Other:     ED Results / Procedures / Treatments   Labs (all labs ordered are listed, but only abnormal results are displayed) Labs Reviewed  CBC - Abnormal; Notable for the following components:      Result Value   WBC 3.9 (*)    RBC 3.75 (*)    Hemoglobin 11.1 (*)    HCT 34.8 (*)    All other components within normal limits  COMPREHENSIVE METABOLIC PANEL - Abnormal; Notable for the following components:   Potassium <2.0 (*)    Chloride 90 (*)    CO2 38 (*)    Glucose, Bld 112 (*)    BUN <5  (*)    Creatinine, Ser 0.41 (*)    Calcium 7.2 (*)    Total Protein 5.9 (*)    Albumin 1.7 (*)    AST 43 (*)    All other components within normal limits  CULTURE, BLOOD (ROUTINE X 2)  CULTURE, BLOOD (ROUTINE X 2)  LACTIC ACID, PLASMA  BRAIN NATRIURETIC PEPTIDE  BLOOD GAS, VENOUS  PREALBUMIN  TROPONIN I (HIGH SENSITIVITY)     EKG  ED ECG REPORT I, Jene Every, the attending physician, personally viewed and interpreted this ECG.  Date: 10/23/2023  Rhythm: normal sinus rhythm QRS Axis: normal Intervals: Abnormal ST/T Wave abnormalities: normal Narrative Interpretation: no evidence of acute ischemia    RADIOLOGY Chest x-ray viewed interpret by me, no pneumothorax    PROCEDURES:  Critical Care performed: yes  CRITICAL CARE Performed by: Jene Every   Total critical care time: 30 minutes  Critical care time was exclusive of  separately billable procedures and treating other patients.  Critical care was necessary to treat or prevent imminent or life-threatening deterioration.  Critical care was time spent personally by me on the following activities: development of treatment plan with patient and/or surrogate as well as nursing, discussions with consultants, evaluation of patient's response to treatment, examination of patient, obtaining history from patient or surrogate, ordering and performing treatments and interventions, ordering and review of laboratory studies, ordering and review of radiographic studies, pulse oximetry and re-evaluation of patient's condition.   Procedures   MEDICATIONS ORDERED IN ED: Medications  potassium chloride 10 mEq in 100 mL IVPB (10 mEq Intravenous New Bag/Given 10/23/23 1357)  potassium chloride SA (KLOR-CON M) CR tablet 40 mEq (40 mEq Oral Given 10/23/23 1358)  fentaNYL (SUBLIMAZE) injection 50 mcg (50 mcg Intravenous Given 10/23/23 1353)     IMPRESSION / MDM / ASSESSMENT AND PLAN / ED COURSE  I reviewed the triage vital  signs and the nursing notes. Patient's presentation is most consistent with acute presentation with potential threat to life or bodily function.  Patient presents with hypoxia and shortness of breath as detailed above, differential includes aspiration pneumonia, pneumonia, CHF  Will obtain labs, x-ray, start the patient on nasal cannula oxygen, send blood cultures, lactic acid.  Notified of critical potassium less than 2, will give IV potassium, K-Dur p.o., IV fentanyl for pain  Given hypoxia, acute hypokalemia will admit to the hospitalist team      FINAL CLINICAL IMPRESSION(S) / ED DIAGNOSES   Final diagnoses:  Acute respiratory failure with hypoxia (HCC)  Hypokalemia     Rx / DC Orders   ED Discharge Orders     None        Note:  This document was prepared using Dragon voice recognition software and may include unintentional dictation errors.   Jene Every, MD 10/23/23 [phone]

## 2023-10-23 NOTE — Progress Notes (Signed)
 [facility] Liaison Note  This patient is currently followed by [facility] outpatient palliative care services.  [facility] will follow through discharge disposition.  Please call with any palliative care questions or concerns.  [facility] Liaison [phone]

## 2023-10-24 DIAGNOSIS — E876 Hypokalemia: Secondary | ICD-10-CM | POA: Diagnosis not present

## 2023-10-24 LAB — BASIC METABOLIC PANEL
Anion gap: 10 (ref 5–15)
BUN: <5 mg/dL — ABNORMAL LOW (ref 6–20)
CO2: 36 mmol/L — ABNORMAL HIGH (ref 22–32)
Calcium: 7.4 mg/dL — ABNORMAL LOW (ref 8.9–10.3)
Chloride: 94 mmol/L — ABNORMAL LOW (ref 98–111)
Creatinine, Ser: 0.38 mg/dL — ABNORMAL LOW (ref 0.44–1.00)
GFR, Estimated: >60 mL/min (ref >60)
Glucose, Bld: 98 mg/dL (ref 70–99)
Potassium: 2.9 mmol/L — ABNORMAL LOW (ref 3.5–5.1)
Sodium: 140 mmol/L (ref 135–145)

## 2023-10-24 LAB — PREALBUMIN: Prealbumin: 7 mg/dL — ABNORMAL LOW (ref 18–38)

## 2023-10-24 MED ORDER — LAMOTRIGINE 100 MG PO TABS
50.0000 mg | ORAL_TABLET | Freq: Two times a day (BID) | ORAL | Status: DC
Start: 2023-11-07 — End: 2023-10-26

## 2023-10-24 MED ORDER — MORPHINE SULFATE 15 MG PO TABS
15.0000 mg | ORAL_TABLET | Freq: Three times a day (TID) | ORAL | Status: AC
  Administered 2023-10-24 – 2023-10-25 (×3): 15 mg via ORAL
  Filled 2023-10-24 (×4): qty 1

## 2023-10-24 MED ORDER — LEVETIRACETAM 500 MG PO TABS
1000.0000 mg | ORAL_TABLET | Freq: Two times a day (BID) | ORAL | Status: AC
  Administered 2023-10-24 – 2023-10-25 (×4): 1000 mg via ORAL
  Filled 2023-10-24 (×4): qty 2

## 2023-10-24 MED ORDER — KETOROLAC TROMETHAMINE 15 MG/ML IJ SOLN
15.0000 mg | Freq: Four times a day (QID) | INTRAMUSCULAR | Status: DC
  Administered 2023-10-24 – 2023-10-26 (×8): 15 mg via INTRAVENOUS
  Filled 2023-10-24 (×8): qty 1

## 2023-10-24 MED ORDER — LEVETIRACETAM 500 MG PO TABS
500.0000 mg | ORAL_TABLET | Freq: Two times a day (BID) | ORAL | Status: DC
  Administered 2023-10-26: 500 mg via ORAL
  Filled 2023-10-24: qty 1

## 2023-10-24 MED ORDER — LAMOTRIGINE 100 MG PO TABS: 100.0000 mg | ORAL_TABLET | Freq: Two times a day (BID) | ORAL | Status: DC

## 2023-10-24 MED ORDER — POTASSIUM CHLORIDE CRYS ER 20 MEQ PO TBCR
40.0000 meq | EXTENDED_RELEASE_TABLET | ORAL | Status: AC
  Administered 2023-10-24 (×2): 40 meq via ORAL
  Filled 2023-10-24 (×2): qty 2

## 2023-10-24 MED ORDER — LAMOTRIGINE 100 MG PO TABS
50.0000 mg | ORAL_TABLET | Freq: Every day | ORAL | Status: DC
  Administered 2023-10-24 – 2023-10-26 (×3): 50 mg via ORAL
  Filled 2023-10-24 (×3): qty 1

## 2023-10-24 NOTE — Plan of Care (Signed)

## 2023-10-24 NOTE — Plan of Care (Signed)
  Problem: Education: Goal: Knowledge of General Education information will improve Description: Including pain rating scale, medication(s)/side effects and non-pharmacologic comfort measures Outcome: Progressing   Problem: Activity: Goal: Risk for activity intolerance will decrease Outcome: Progressing   Problem: Nutrition: Goal: Adequate nutrition will be maintained Outcome: Progressing   Problem: Elimination: Goal: Will not experience complications related to bowel motility Outcome: Progressing   Problem: Pain Managment: Goal: General experience of comfort will improve and/or be controlled Outcome: Progressing   Problem: Safety: Goal: Ability to remain free from injury will improve Outcome: Progressing   Problem: Skin Integrity: Goal: Risk for impaired skin integrity will decrease Outcome: Progressing

## 2023-10-24 NOTE — Care Management Obs Status (Signed)
 MEDICARE OBSERVATION STATUS NOTIFICATION   Patient Details  Name: Abigail [name] [facility] MRN: [id number] Date of Birth: 1966-08-07   Medicare Observation Status Notification Given:  Abigail [name], CMA 10/24/2023, 3:58 PM

## 2023-10-24 NOTE — Progress Notes (Signed)
 Chart review progress note Called by Dr. Georgeann Oppenheim to discuss this patient-mostly for medication management. Patient with history of refractory epilepsy in the setting of noncompliance to medications. Followed by Dr. Bernette Mayers clinic Based off of his last set of recommendations and the clinic note, where he is tapering multiple medications down while tapering other medications up and the future plans as well my recommendations would be as follows:  - Continue carbamazepine 100 twice daily - Do not restart Vimpat - Start down tapering Keppra-we will do that relatively faster.  Currently on 1500 twice daily of Keppra.  I would recommend tapering down to Keppra 1000 twice daily for 2 days followed by Keppra 500 twice daily for 2 days and then stopping it. - Initiate lamotrigine green starter kit-for patients taking carbamazepine --start lamotrigine 50 mg daily for 2 weeks followed by lamotrigine 50 mg twice daily for 2 weeks followed by lamotrigine 100 mg twice daily. -Continue home gabapentin  Follow-up with Dr. Sherryll Burger in clinic in the next 4 weeks  Orders changed in the chart.  Plan relayed to Dr. Georgeann Oppenheim   -- Milon Dikes, MD Neurologist [facility]

## 2023-10-24 NOTE — Progress Notes (Signed)
 PT Cancellation Note  Patient Details Name: Abigail [name] [facility] MRN: [id number] DOB: 24-Apr-1966   Cancelled Treatment:    Reason Eval/Treat Not Completed: Pain limiting ability to participate.  PT consult received.  Chart reviewed.  Pt declining PT evaluation d/t 10/10 LBP; discussed this with pt's nurse who was already aware and already messaged MD to address pt's pain concerns.  Will re-attempt PT evaluation at a later date/time.  Hendricks Limes, PT 10/24/23, 8:41 AM

## 2023-10-24 NOTE — Progress Notes (Signed)
 OT Cancellation Note  Patient Details Name: Abigail [name] [facility] MRN: [id number] DOB: 10/05/1965   Cancelled Treatment:    Reason Eval/Treat Not Completed: Pain limiting ability to participate. Consult received. Chart reviewed. Per PT, pt declining therapy d/t 10/10 LBP. RN has messaged MD to address pt's pain concerns. Will re-attempt OT evaluation at a later date/time.   Arman Filter., MPH, MS, OTR/L ascom [phone] 10/24/23, 9:26 AM

## 2023-10-24 NOTE — Progress Notes (Signed)
 PROGRESS NOTE    [facility]  GNF:[id number] DOB: [birth year] DOA: 10/23/2023 PCP: Alease Medina, MD    Brief Narrative:  58 y.o. female with medical history significant of GCA off steroid, seizure with status epilepticus, chronic back pain on narcotics, recurrent pancreatitis, mild intermittent asthma, chronic hypoxic respiratory failure on 2 L continuously, presented with multiple complaints including sudden worsening of back pain, generalized weakness, and ongoing diarrhea.   Symptoms started 3 to 4 days ago, patient suddenly developed worsening of back pain.  At baseline she does have chronic back pain and she had surgery of fusion on L4-L5 and L5-S1 10 years ago.  She has been taking narcotic at home.  Over the weekend she started to have sudden worsening of back pain radiating to left calf and lateral side of left foot, and she has been limping at home because of the pain.  1 week ago she also started to have loose diarrhea, nonsmelly nonbloody denied any abdominal pain.  Today she called EMS, EMS arrived and found patient O2 saturation in 70s on room air. ED Course: Temperature 98.4, heart rate 80, blood pressure 124/68, O2 saturation 70% on room air and stabilized on 2 L 97%.  Chest x-ray showed bilateral lower lobe atelectasis.  Blood work showed K2.0, bicarb 38, VBG 7.54/57/47   Assessment & Plan:   Principal Problem:   Hypokalemia Active Problems:   Impaired ambulation   Acute on chronic hypoxic respiratory failure -Suspect this is due to atelectasis from acute ambulatory impairment secondary to severe back pain.  Hypoxia improved after initial oxygen treatment.  Will add incentive spirometry and encourage activity as tolerated -Other DDx, no acute infiltrates on chest x-ray, low suspicion for pneumonia.  Monitor off antibiotics   Hypokalemia -Likely secondary to subacute diarrhea probably caused by stool softener and laxative. -IV and p.o. replacement, check  magnesium level -Potassium levels have improved Plan: -Discontinue Colace -Fecal elastase  Acute on chronic ambulatory impairment -Lumbar CT with severe degenerative disc disease -Ambulation efforts limited by pain Plan: Multimodal pain control Continue therapy efforts   Uncompensated combined metabolic alkalosis and respiratory acidosis -Treat diarrhea then reevaluate   COPD/asthma -No symptoms or signs of acute exacerbation   Severe hypoalbuminemia -Check prealbumin level -No history of liver disease, clinically suspect malabsorption from chronic pancreatic insufficiency.  Check fecal elastase.  Hold Creon until this is confirmed   Seizure disorder -Multiple agents.  Patient is concerned that Keppra may be triggering visual deficits and visual hallucinations.  Case discussed with Dr. Wilford Corner neurology who made recommendations for medication titration.  DVT prophylaxis: Lovenox Code Status: Full Family Communication: None Disposition Plan: Status is: Observation The patient will require care spanning > 2 midnights and should be moved to inpatient because: Very intractable pain.  Ambulation deficit.   Level of care: Telemetry Medical  Consultants:  None  Procedures:  None  Antimicrobials: None    Subjective: Seen and examined.  Resting in bed.  Answers all questions appropriately.  Reports pain is manageable when stationary but severely exacerbated with any mobility efforts.  Objective: Vitals:   10/24/23 0733 10/24/23 1031 10/24/23 1050 10/24/23 1101  BP: (!) 142/69     Pulse: 99  (!) 106   Resp: 20 18 20 18   Temp: 98.9 F (37.2 C)     TempSrc:      SpO2: (!) 76%  (!) 82% 94%  Weight:      Height:  Intake/Output Summary (Last 24 hours) at 10/24/2023 1457 Last data filed at 10/24/2023 1100 Gross per 24 hour  Intake 340 ml  Output --  Net 340 ml   Filed Weights   10/23/23 1709  Weight: 68.1 kg    Examination:  General exam: Appears  fatigued Respiratory system: Clear to auscultation. Respiratory effort normal. Cardiovascular system: S1-S2, RRR, no murmurs, no pedal edema Gastrointestinal system: Soft, NT/ND, normal bowel sounds Central nervous system: Alert and oriented. No focal neurological deficits. Extremities: Decreased power bilateral lower extremities.  Gait not assessed. Skin: No rashes, lesions or ulcers Psychiatry: Judgement and insight appear normal. Mood & affect appropriate.     Data Reviewed: I have personally reviewed following labs and imaging studies  CBC: Recent Labs  Lab 10/23/23 1144  WBC 3.9*  HGB 11.1*  HCT 34.8*  MCV 92.8  PLT 251   Basic Metabolic Panel: Recent Labs  Lab 10/23/23 1144 10/23/23 1739 10/24/23 0522  NA 137  --  140  K <2.0*  --  2.9*  CL 90*  --  94*  CO2 38*  --  36*  GLUCOSE 112*  --  98  BUN <5*  --  <5*  CREATININE 0.41*  --  0.38*  CALCIUM 7.2*  --  7.4*  MG  --  1.7  --    GFR: Estimated Creatinine Clearance: 74.5 mL/min (A) (by C-G formula based on SCr of 0.38 mg/dL (L)). Liver Function Tests: Recent Labs  Lab 10/23/23 1144  AST 43*  ALT 22  ALKPHOS 96  BILITOT 0.5  PROT 5.9*  ALBUMIN 1.7*   No results for input(s): "LIPASE", "AMYLASE" in the last 168 hours. No results for input(s): "AMMONIA" in the last 168 hours. Coagulation Profile: No results for input(s): "INR", "PROTIME" in the last 168 hours. Cardiac Enzymes: No results for input(s): "CKTOTAL", "CKMB", "CKMBINDEX", "TROPONINI" in the last 168 hours. BNP (last 3 results) No results for input(s): "PROBNP" in the last 8760 hours. HbA1C: No results for input(s): "HGBA1C" in the last 72 hours. CBG: No results for input(s): "GLUCAP" in the last 168 hours. Lipid Profile: No results for input(s): "CHOL", "HDL", "LDLCALC", "TRIG", "CHOLHDL", "LDLDIRECT" in the last 72 hours. Thyroid Function Tests: No results for input(s): "TSH", "T4TOTAL", "FREET4", "T3FREE", "THYROIDAB" in the last 72  hours. Anemia Panel: No results for input(s): "VITAMINB12", "FOLATE", "FERRITIN", "TIBC", "IRON", "RETICCTPCT" in the last 72 hours. Sepsis Labs: Recent Labs  Lab 10/23/23 1144  LATICACIDVEN 1.4    Recent Results (from the past 240 hours)  Blood culture (routine x 2)     Status: None (Preliminary result)   Collection Time: 10/23/23 11:44 AM   Specimen: BLOOD  Result Value Ref Range Status   Specimen Description BLOOD RIGHT ARM  Final   Special Requests   Final    BOTTLES DRAWN AEROBIC AND ANAEROBIC Blood Culture results may not be optimal due to an inadequate volume of blood received in culture bottles   Culture   Final    NO GROWTH < 24 HOURS Performed at [facility], [address] [place], [place] [address]    Report Status PENDING  Incomplete  Blood culture (routine x 2)     Status: None (Preliminary result)   Collection Time: 10/23/23  5:39 PM   Specimen: BLOOD LEFT ARM  Result Value Ref Range Status   Specimen Description BLOOD LEFT ARM  Final   Special Requests   Final    BOTTLES DRAWN AEROBIC AND ANAEROBIC  Blood Culture adequate volume   Culture   Final    NO GROWTH < 24 HOURS Performed at [facility] [address] [place], [place] [address]    Report Status PENDING  Incomplete         Radiology Studies: CT LUMBAR SPINE WO CONTRAST Result Date: 10/23/2023 CLINICAL DATA:  Low back pain.  Increased fracture risk. EXAM: CT LUMBAR SPINE WITHOUT CONTRAST TECHNIQUE: Multidetector CT imaging of the lumbar spine was performed without intravenous contrast administration. Multiplanar CT image reconstructions were also generated. RADIATION DOSE REDUCTION: This exam was performed according to the departmental dose-optimization program which includes automated exposure control, adjustment of the mA and/or kV according to patient size and/or use of iterative reconstruction technique. COMPARISON:  11/29/2011 lumbar CT. Abdomen pelvis CT 09/24/2023. Abdomen  CT 07/29/2023. Abdomen CT 12/14/2022. FINDINGS: Segmentation: 5 lumbar type vertebral bodies. Alignment: S shaped scoliotic curvature of the thoracolumbar spine. Vertebrae: Distant discectomy and fusion from L4 to the sacrum with solid union. No evidence of hardware complication. Compression deformity of the L1 vertebral body with loss of height anteriorly of 40%. Mild posterior bowing of the posterosuperior margin of the vertebral body. No apparent progression with compared to the CT study of 1 month ago. Degenerative endplate changes of L2, similar to the prior study. Paraspinal and other soft tissues: No evidence of paravertebral inflammatory changes. Aortic atherosclerosis incidentally noted. Moderate amount of free fluid in the pelvis, etiology not specifically demonstrated on this study. On the previous abdominal CT, this was felt likely secondary to pancreatitis. Disc levels: T12-L1: Mild bulging of the disc. Posterior bowing of the posterosuperior margin of the L1 vertebral body. No apparent neural compression. L1-2: Disc degeneration with endplate irregularity, similar to the study of 1 month ago, therefore much more likely degenerative than inflammatory. Bulging of the disc. Facet degeneration and hypertrophy worse on the left. Narrowing of the left lateral recess and intervertebral foramen on the left that could possibly cause neural compression. L2-3: Disc degeneration with vacuum phenomenon. Endplate osteophytes and bulging of the disc. Facet and ligamentous hypertrophy. Moderate stenosis of the canal, lateral recesses and neural foramina which could possibly cause neural compression. L3-4: Bulging of the disc. Facet and ligamentous hypertrophy worse on the right. Stenosis of the lateral recesses and foramina right more than left. Some potential for neural compression on the right. L4 to sacrum: Previous fusion procedure. Solid union with wide patency of the canal and foramina. No evidence of sacral  fracture IMPRESSION: 1. Distant discectomy and fusion from L4 to the sacrum with solid union. No evidence of hardware complication. 2. Compression deformity of the L1 vertebral body with loss of height anteriorly of 40%. Mild posterior bowing of the posterosuperior margin of the vertebral body. No apparent progression with compared to the CT study of 1 month ago or even when compared to the study of April 2024. 3. L1-2: Disc degeneration with endplate irregularity, similar to the study of 1 month ago, therefore much more likely degenerative than inflammatory. Bulging of the disc. Facet degeneration and hypertrophy worse on the left. Narrowing of the left lateral recess and intervertebral foramen on the left that could possibly cause neural compression. 4. L2-3: Disc degeneration with vacuum phenomenon. Endplate osteophytes and bulging of the disc. Facet and ligamentous hypertrophy. Moderate stenosis of the canal, lateral recesses and neural foramina which could possibly cause neural compression. 5. L3-4: Bulging of the disc. Facet and ligamentous hypertrophy worse on the right. Stenosis of the lateral  recesses and foramina right more than left. Some potential for neural compression on the right. 6. Moderate amount of free fluid in the pelvis, etiology not specifically demonstrated on this study. On the previous abdominal CT, this was felt likely secondary to pancreatitis. Aortic Atherosclerosis (ICD10-I70.0). Electronically Signed   By: Paulina Fusi M.D.   On: 10/23/2023 16:41   DG Chest Port 1 View Result Date: 10/23/2023 CLINICAL DATA:  Shortness of breath. EXAM: PORTABLE CHEST 1 VIEW COMPARISON:  October 08, 2023. FINDINGS: The heart size and mediastinal contours are within normal limits. Hypoinflation of the lungs is noted. Mild bibasilar subsegmental atelectasis is noted. The visualized skeletal structures are unremarkable. IMPRESSION: Hypoinflation of the lungs with mild bibasilar subsegmental atelectasis.  Electronically Signed   By: Lupita Raider M.D.   On: 10/23/2023 14:31        Scheduled Meds:  carbamazepine  100 mg Oral BID   DULoxetine  90 mg Oral Daily   enoxaparin (LOVENOX) injection  40 mg Subcutaneous Q24H   feeding supplement  237 mL Oral BID BM   gabapentin  300 mg Oral QID   ketorolac  15 mg Intravenous Q6H   [START ON 11/21/2023] lamoTRIgine  100 mg Oral BID   lamoTRIgine  50 mg Oral Daily   [START ON 11/07/2023] lamoTRIgine  50 mg Oral BID   levETIRAcetam  1,000 mg Oral BID   Followed by   Melene Muller ON 10/26/2023] levETIRAcetam  500 mg Oral BID   lipase/protease/amylase  12,000 Units Oral TID AC   melatonin  10 mg Oral QHS   mirtazapine  30 mg Oral QHS   modafinil  200 mg Oral Daily   morphine  15 mg Oral Q8H   nicotine  14 mg Transdermal Daily   pantoprazole  40 mg Oral Daily   polyethylene glycol  17 g Oral Daily   Continuous Infusions:   LOS: 0 days     Tresa Moore, MD [facility]   If 7PM-7AM, please contact night-coverage  10/24/2023, 2:57 PM

## 2023-10-24 NOTE — Evaluation (Signed)
 Occupational Therapy Evaluation Patient Details Name: Abigail [name] [facility] MRN: [id number] DOB: 1966/04/27 Today's Date: 10/24/2023   History of Present Illness   58 y.o. female with medical history significant of GCA off steroid, seizure with status epilepticus (recent admission 10/08/23), chronic back pain on narcotics, recurrent pancreatitis, mild intermittent asthma, chronic hypoxic respiratory failure on 2 L continuously, presented with multiple complaints including sudden worsening of back pain, generalized weakness, and ongoing diarrhea.     Clinical Impressions Pt seen for OT evaluation this date, limited by significant middle back pain. Pt endorses no improvement since previous pain medication and expresses frustration and upset that she feels she is not being listened to by staff regarding her pain. MD, RN, TOC, and PT notified at end of session. Pt reports 10/10 constant middle back pain and declines EOB/OOB attempts due to this. Pt agreeable to participate in repositioning for improved comfort and pressure relief. Required MIN A for rolling. In side lying was able to pull herself up in the bed when flat and with use of bed rails. Moved into chair position in bed briefly and pt able to use bilat bed rails to pull herself forward x3 so OT could place pillow behind her back per her request. HOB lowered to comfortable position. Pt educated in repositioning strategies to support optimal comfort and pain control. Pt verbalized understanding. Pt will benefit from skilled OT services to maximize return to PLOF.      If plan is discharge home, recommend the following:   A lot of help with walking and/or transfers;A lot of help with bathing/dressing/bathroom;Assistance with cooking/housework;Assist for transportation;Help with stairs or ramp for entrance     Functional Status Assessment   Patient has had a recent decline in their functional status and demonstrates the ability to make  significant improvements in function in a reasonable and predictable amount of time.     Equipment Recommendations   None recommended by OT     Recommendations for Other Services         Precautions/Restrictions   Precautions Precautions: Fall Restrictions Weight Bearing Restrictions Per Provider Order: No     Mobility Bed Mobility Overal bed mobility: Needs Assistance Bed Mobility: Rolling, Supine to Sit Rolling: Min assist   Supine to sit: Used rails, Min assist, Mod assist (long sit in bed)          Transfers                   General transfer comment: pt declined 2/2 pain      Balance Overall balance assessment: Needs assistance Sitting-balance support: Feet supported, Bilateral upper extremity supported Sitting balance-Leahy Scale: Fair                                     ADL either performed or assessed with clinical judgement   ADL Overall ADL's : Needs assistance/impaired                 Upper Body Dressing : Sitting;Minimal assistance   Lower Body Dressing: Sitting/lateral leans;Bed level;Moderate assistance Lower Body Dressing Details (indicate cue type and reason): long sitting in bed, simulated                     Vision         Perception         Praxis  Pertinent Vitals/Pain Pain Assessment Pain Assessment: 0-10 Pain Score: 10-Worst pain ever Pain Location: mid back Pain Descriptors / Indicators: Grimacing, Guarding, Sharp, Moaning Pain Intervention(s): Limited activity within patient's tolerance, Monitored during session, Premedicated before session, Repositioned, Patient requesting pain meds-RN notified     Extremity/Trunk Assessment Upper Extremity Assessment Upper Extremity Assessment: Generalized weakness   Lower Extremity Assessment Lower Extremity Assessment: Generalized weakness   Cervical / Trunk Assessment Cervical / Trunk Assessment: Kyphotic   Communication  Communication Communication: No apparent difficulties   Cognition Arousal: Alert Behavior During Therapy: WFL for tasks assessed/performed Cognition: No apparent impairments             OT - Cognition Comments: alert, oriented,, follows simple commands well                 Following commands: Intact       Cueing  General Comments   Cueing Techniques: Verbal cues      Exercises Other Exercises Other Exercises: Educated in repositioning for comfort/pain control with bed controls, pillows   Shoulder Instructions      Home Living Family/patient expects to be discharged to:: Private residence Living Arrangements: Children Available Help at Discharge: Family Type of Home: Mobile home Home Access: Ramped entrance     Home Layout: One level     Bathroom Shower/Tub: Walk-in shower;Sponge bathes at baseline   [employer]: Standard     Home Equipment: Agricultural consultant (2 wheels);Wheelchair - Set designer (4 wheels)   Additional Comments: lives with son who is there 24/7      Prior Functioning/Environment Prior Level of Function : Needs assist             Mobility Comments: reports she uses the walker in home; pt only leaves house for doctors appt per her report; ADLs Comments: pt reports she performs feeding, grooming, dressing, toileting with MOD I, has not gotten in the shower "in awhile" uses the bath wipes instead; assist for IADLs from son, mom, or friend    OT Problem List: Decreased strength;Decreased activity tolerance;Decreased knowledge of use of DME or AE;Decreased safety awareness;Impaired balance (sitting and/or standing);Decreased cognition;Pain   OT Treatment/Interventions: Self-care/ADL training;DME and/or AE instruction;Therapeutic activities;Balance training;Therapeutic exercise;Energy conservation;Patient/family education      OT Goals(Current goals can be found in the care plan section)   Acute Rehab OT  Goals Patient Stated Goal: have less pain OT Goal Formulation: With patient Time For Goal Achievement: 11/07/23 Potential to Achieve Goals: Good ADL Goals Pt Will Perform Lower Body Dressing: with modified independence;sit to/from stand Pt Will Transfer to Toilet: with modified independence;ambulating Pt Will Perform Toileting - Clothing Manipulation and hygiene: with modified independence Additional ADL Goal #1: Pt will verbalize plan to implement at least 1 learned compensatory strategy for ADL to minimize back pain.   OT Frequency:  Min 2X/week    Co-evaluation              AM-PAC OT "6 Clicks" Daily Activity     Outcome Measure Help from another person eating meals?: None Help from another person taking care of personal grooming?: A Little Help from another person toileting, which includes using toliet, bedpan, or urinal?: A Lot Help from another person bathing (including washing, rinsing, drying)?: A Lot Help from another person to put on and taking off regular upper body clothing?: A Lot Help from another person to put on and taking off regular lower body clothing?: A Lot 6 Click Score: 15   End  of Session Equipment Utilized During Treatment: Oxygen Nurse Communication: Patient requests pain meds (MD, TOC, PT, RN - pain uncontrolled, assist for med mgt, rec SNF)  Activity Tolerance: Patient limited by pain Patient left: in bed;with call bell/phone within reach;with bed alarm set  OT Visit Diagnosis: Other abnormalities of gait and mobility (R26.89);Muscle weakness (generalized) (M62.81)                Time: 1610-9604 OT Time Calculation (min): 15 min Charges:  OT General Charges $OT Visit: 1 Visit OT Evaluation $OT Eval Low Complexity: 1 Low  Arman Filter., MPH, MS, OTR/L ascom [phone] 10/24/23, 2:47 PM

## 2023-10-25 DIAGNOSIS — G40909 Epilepsy, unspecified, not intractable, without status epilepticus: Secondary | ICD-10-CM | POA: Diagnosis present

## 2023-10-25 DIAGNOSIS — J4489 Other specified chronic obstructive pulmonary disease: Secondary | ICD-10-CM | POA: Diagnosis present

## 2023-10-25 DIAGNOSIS — K861 Other chronic pancreatitis: Secondary | ICD-10-CM | POA: Diagnosis present

## 2023-10-25 DIAGNOSIS — R262 Difficulty in walking, not elsewhere classified: Secondary | ICD-10-CM | POA: Diagnosis present

## 2023-10-25 DIAGNOSIS — E876 Hypokalemia: Secondary | ICD-10-CM | POA: Diagnosis present

## 2023-10-25 DIAGNOSIS — M069 Rheumatoid arthritis, unspecified: Secondary | ICD-10-CM | POA: Diagnosis present

## 2023-10-25 DIAGNOSIS — K219 Gastro-esophageal reflux disease without esophagitis: Secondary | ICD-10-CM | POA: Diagnosis present

## 2023-10-25 DIAGNOSIS — Z79899 Other long term (current) drug therapy: Secondary | ICD-10-CM | POA: Diagnosis not present

## 2023-10-25 DIAGNOSIS — K580 Irritable bowel syndrome with diarrhea: Secondary | ICD-10-CM | POA: Diagnosis present

## 2023-10-25 DIAGNOSIS — T426X6A Underdosing of other antiepileptic and sedative-hypnotic drugs, initial encounter: Secondary | ICD-10-CM | POA: Diagnosis present

## 2023-10-25 DIAGNOSIS — E874 Mixed disorder of acid-base balance: Secondary | ICD-10-CM | POA: Diagnosis present

## 2023-10-25 DIAGNOSIS — G8929 Other chronic pain: Secondary | ICD-10-CM | POA: Diagnosis present

## 2023-10-25 DIAGNOSIS — M5136 Other intervertebral disc degeneration, lumbar region with discogenic back pain only: Secondary | ICD-10-CM | POA: Diagnosis present

## 2023-10-25 DIAGNOSIS — M797 Fibromyalgia: Secondary | ICD-10-CM | POA: Diagnosis present

## 2023-10-25 DIAGNOSIS — Z8249 Family history of ischemic heart disease and other diseases of the circulatory system: Secondary | ICD-10-CM | POA: Diagnosis not present

## 2023-10-25 DIAGNOSIS — Z91148 Patient's other noncompliance with medication regimen for other reason: Secondary | ICD-10-CM | POA: Diagnosis not present

## 2023-10-25 DIAGNOSIS — Z833 Family history of diabetes mellitus: Secondary | ICD-10-CM | POA: Diagnosis not present

## 2023-10-25 DIAGNOSIS — J9621 Acute and chronic respiratory failure with hypoxia: Secondary | ICD-10-CM | POA: Diagnosis present

## 2023-10-25 DIAGNOSIS — K8689 Other specified diseases of pancreas: Secondary | ICD-10-CM | POA: Diagnosis present

## 2023-10-25 DIAGNOSIS — K9089 Other intestinal malabsorption: Secondary | ICD-10-CM | POA: Diagnosis present

## 2023-10-25 DIAGNOSIS — J4520 Mild intermittent asthma, uncomplicated: Secondary | ICD-10-CM | POA: Diagnosis present

## 2023-10-25 DIAGNOSIS — F17210 Nicotine dependence, cigarettes, uncomplicated: Secondary | ICD-10-CM | POA: Diagnosis present

## 2023-10-25 DIAGNOSIS — J9811 Atelectasis: Secondary | ICD-10-CM | POA: Diagnosis present

## 2023-10-25 DIAGNOSIS — E8809 Other disorders of plasma-protein metabolism, not elsewhere classified: Secondary | ICD-10-CM | POA: Diagnosis present

## 2023-10-25 LAB — CBC WITH DIFFERENTIAL/PLATELET
Abs Immature Granulocytes: 0.02 10*3/uL (ref 0.00–0.07)
Basophils Absolute: 0 10*3/uL (ref 0.0–0.1)
Basophils Relative: 1 %
Eosinophils Absolute: 0.1 10*3/uL (ref 0.0–0.5)
Eosinophils Relative: 1 %
HCT: 34.7 % — ABNORMAL LOW (ref 36.0–46.0)
Hemoglobin: 10.6 g/dL — ABNORMAL LOW (ref 12.0–15.0)
Immature Granulocytes: 0 %
Lymphocytes Relative: 33 %
Lymphs Abs: 1.8 10*3/uL (ref 0.7–4.0)
MCH: 29 pg (ref 26.0–34.0)
MCHC: 30.5 g/dL (ref 30.0–36.0)
MCV: 94.8 fL (ref 80.0–100.0)
Monocytes Absolute: 0.6 10*3/uL (ref 0.1–1.0)
Monocytes Relative: 11 %
Neutro Abs: 3 10*3/uL (ref 1.7–7.7)
Neutrophils Relative %: 54 %
Platelets: 201 10*3/uL (ref 150–400)
RBC: 3.66 MIL/uL — ABNORMAL LOW (ref 3.87–5.11)
RDW: 14.9 % (ref 11.5–15.5)
WBC: 5.4 10*3/uL (ref 4.0–10.5)
nRBC: 0.4 % — ABNORMAL HIGH (ref 0.0–0.2)

## 2023-10-25 LAB — BASIC METABOLIC PANEL
Anion gap: 11 (ref 5–15)
BUN: <5 mg/dL — ABNORMAL LOW (ref 6–20)
CO2: 31 mmol/L (ref 22–32)
Calcium: 7.4 mg/dL — ABNORMAL LOW (ref 8.9–10.3)
Chloride: 95 mmol/L — ABNORMAL LOW (ref 98–111)
Creatinine, Ser: 0.45 mg/dL (ref 0.44–1.00)
GFR, Estimated: >60 mL/min (ref >60)
Glucose, Bld: 84 mg/dL (ref 70–99)
Potassium: 3.5 mmol/L (ref 3.5–5.1)
Sodium: 137 mmol/L (ref 135–145)

## 2023-10-25 MED ORDER — MORPHINE SULFATE 15 MG PO TABS
15.0000 mg | ORAL_TABLET | Freq: Three times a day (TID) | ORAL | Status: AC | PRN
  Administered 2023-10-25: 15 mg via ORAL
  Filled 2023-10-25: qty 1

## 2023-10-25 NOTE — Progress Notes (Signed)
 PROGRESS NOTE    Tuwana Kapaun [facility]  ZOX:[id number] DOB: 09/30/1965 DOA: 10/23/2023 PCP: Alease Medina, MD    Brief Narrative:  58 y.o. female with medical history significant of GCA off steroid, seizure with status epilepticus, chronic back pain on narcotics, recurrent pancreatitis, mild intermittent asthma, chronic hypoxic respiratory failure on 2 L continuously, presented with multiple complaints including sudden worsening of back pain, generalized weakness, and ongoing diarrhea.   Symptoms started 3 to 4 days ago, patient suddenly developed worsening of back pain.  At baseline she does have chronic back pain and she had surgery of fusion on L4-L5 and L5-S1 10 years ago.  She has been taking narcotic at home.  Over the weekend she started to have sudden worsening of back pain radiating to left calf and lateral side of left foot, and she has been limping at home because of the pain.  1 week ago she also started to have loose diarrhea, nonsmelly nonbloody denied any abdominal pain.  Today she called EMS, EMS arrived and found patient O2 saturation in 70s on room air. ED Course: Temperature 98.4, heart rate 80, blood pressure 124/68, O2 saturation 70% on room air and stabilized on 2 L 97%.  Chest x-ray showed bilateral lower lobe atelectasis.  Blood work showed K2.0, bicarb 38, VBG 7.54/57/47   Assessment & Plan:   Principal Problem:   Hypokalemia Active Problems:   Impaired ambulation   Ambulatory dysfunction   Acute on chronic hypoxic respiratory failure -Suspect this is due to atelectasis from acute ambulatory impairment secondary to severe back pain.  Hypoxia improved after initial oxygen treatment.  Will add incentive spirometry and encourage activity as tolerated -Other DDx, no acute infiltrates on chest x-ray, low suspicion for pneumonia.  Monitor off antibiotics Plan: On room air   Hypokalemia -Likely secondary to subacute diarrhea probably caused by stool softener and  laxative. -IV and p.o. replacement, check magnesium level -Potassium levels have improved -Back in normal range on 3/7 Plan: -Discontinue Colace -Fecal elastase, pending  Acute on chronic ambulatory impairment -Lumbar CT with severe degenerative disc disease -Ambulation efforts limited by pain Plan: Multimodal pain control Continue therapy efforts May need skilled nursing facility.  Therapy evaluation pending     COPD/asthma -No symptoms or signs of acute exacerbation   Severe hypoalbuminemia -Check prealbumin level -No history of liver disease, clinically suspect malabsorption from chronic pancreatic insufficiency.  Check fecal elastase.  Hold Creon until this is confirmed   Seizure disorder -Multiple agents.  Patient is concerned that Keppra may be triggering visual deficits and visual hallucinations.  Case discussed with Dr. Wilford Corner neurology who made recommendations for medication titration.  DVT prophylaxis: Lovenox Code Status: Full Family Communication: None Disposition Plan: Status is: Inpatient Remains inpatient appropriate because: Intractable back pain.  Ambulatory dysfunction.  May need skilled nursing facility.  Pending therapy evaluations.     Level of care: Med-Surg  Consultants:  None  Procedures:  None  Antimicrobials: None    Subjective: Seen and examined.  Resting in bed.  Appears more comfortable than yesterday.  Objective: Vitals:   10/24/23 1732 10/24/23 2010 10/25/23 0526 10/25/23 0749  BP:  (!) 101/59 113/65 109/62  Pulse:  93 (!) 110 (!) 106  Resp: 18  18 16   Temp:  98 F (36.7 C) 98.3 F (36.8 C) 99 F (37.2 C)  TempSrc:      SpO2:  95% (!) 61% 90%  Weight:      Height:  No intake or output data in the 24 hours ending 10/25/23 1432  Filed Weights   10/23/23 1709  Weight: 68.1 kg    Examination:  General exam: NAD.  Appears fatigued and chronically Respiratory system: Lungs clear.  Normal work of breathing.  Room  air Cardiovascular system: S1-S2, RRR, no murmurs, no pedal edema Gastrointestinal system: Soft, NT/ND, normal bowel sounds Central nervous system: Alert and oriented. No focal neurological deficits. Extremities: Decreased power bilateral lower extremities.  Gait not assessed. Skin: No rashes, lesions or ulcers Psychiatry: Judgement and insight appear normal. Mood & affect appropriate.     Data Reviewed: I have personally reviewed following labs and imaging studies  CBC: Recent Labs  Lab 10/23/23 1144 10/25/23 0427  WBC 3.9* 5.4  NEUTROABS  --  3.0  HGB 11.1* 10.6*  HCT 34.8* 34.7*  MCV 92.8 94.8  PLT 251 201   Basic Metabolic Panel: Recent Labs  Lab 10/23/23 1144 10/23/23 1739 10/24/23 0522 10/25/23 0427  NA 137  --  140 137  K <2.0*  --  2.9* 3.5  CL 90*  --  94* 95*  CO2 38*  --  36* 31  GLUCOSE 112*  --  98 84  BUN <5*  --  <5* <5*  CREATININE 0.41*  --  0.38* 0.45  CALCIUM 7.2*  --  7.4* 7.4*  MG  --  1.7  --   --    GFR: Estimated Creatinine Clearance: 74.5 mL/min (by C-G formula based on SCr of 0.45 mg/dL). Liver Function Tests: Recent Labs  Lab 10/23/23 1144  AST 43*  ALT 22  ALKPHOS 96  BILITOT 0.5  PROT 5.9*  ALBUMIN 1.7*   No results for input(s): "LIPASE", "AMYLASE" in the last 168 hours. No results for input(s): "AMMONIA" in the last 168 hours. Coagulation Profile: No results for input(s): "INR", "PROTIME" in the last 168 hours. Cardiac Enzymes: No results for input(s): "CKTOTAL", "CKMB", "CKMBINDEX", "TROPONINI" in the last 168 hours. BNP (last 3 results) No results for input(s): "PROBNP" in the last 8760 hours. HbA1C: No results for input(s): "HGBA1C" in the last 72 hours. CBG: No results for input(s): "GLUCAP" in the last 168 hours. Lipid Profile: No results for input(s): "CHOL", "HDL", "LDLCALC", "TRIG", "CHOLHDL", "LDLDIRECT" in the last 72 hours. Thyroid Function Tests: No results for input(s): "TSH", "T4TOTAL", "FREET4",  "T3FREE", "THYROIDAB" in the last 72 hours. Anemia Panel: No results for input(s): "VITAMINB12", "FOLATE", "FERRITIN", "TIBC", "IRON", "RETICCTPCT" in the last 72 hours. Sepsis Labs: Recent Labs  Lab 10/23/23 1144  LATICACIDVEN 1.4    Recent Results (from the past 240 hours)  Blood culture (routine x 2)     Status: None (Preliminary result)   Collection Time: 10/23/23 11:44 AM   Specimen: BLOOD  Result Value Ref Range Status   Specimen Description BLOOD RIGHT ARM  Final   Special Requests   Final    BOTTLES DRAWN AEROBIC AND ANAEROBIC Blood Culture results may not be optimal due to an inadequate volume of blood received in culture bottles   Culture   Final    NO GROWTH 2 DAYS Performed at [facility], [address] [place], [place] [address]    Report Status PENDING  Incomplete  Blood culture (routine x 2)     Status: None (Preliminary result)   Collection Time: 10/23/23  5:39 PM   Specimen: BLOOD LEFT ARM  Result Value Ref Range Status   Specimen Description BLOOD LEFT ARM  Final  Special Requests   Final    BOTTLES DRAWN AEROBIC AND ANAEROBIC Blood Culture adequate volume   Culture   Final    NO GROWTH 2 DAYS Performed at [facility], [address], [place], [place] [address]    Report Status PENDING  Incomplete         Radiology Studies: CT LUMBAR SPINE WO CONTRAST Result Date: 10/23/2023 CLINICAL DATA:  Low back pain.  Increased fracture risk. EXAM: CT LUMBAR SPINE WITHOUT CONTRAST TECHNIQUE: Multidetector CT imaging of the lumbar spine was performed without intravenous contrast administration. Multiplanar CT image reconstructions were also generated. RADIATION DOSE REDUCTION: This exam was performed according to the departmental dose-optimization program which includes automated exposure control, adjustment of the mA and/or kV according to patient size and/or use of iterative reconstruction technique. COMPARISON:  11/29/2011 lumbar CT. Abdomen  pelvis CT 09/24/2023. Abdomen CT 07/29/2023. Abdomen CT 12/14/2022. FINDINGS: Segmentation: 5 lumbar type vertebral bodies. Alignment: S shaped scoliotic curvature of the thoracolumbar spine. Vertebrae: Distant discectomy and fusion from L4 to the sacrum with solid union. No evidence of hardware complication. Compression deformity of the L1 vertebral body with loss of height anteriorly of 40%. Mild posterior bowing of the posterosuperior margin of the vertebral body. No apparent progression with compared to the CT study of 1 month ago. Degenerative endplate changes of L2, similar to the prior study. Paraspinal and other soft tissues: No evidence of paravertebral inflammatory changes. Aortic atherosclerosis incidentally noted. Moderate amount of free fluid in the pelvis, etiology not specifically demonstrated on this study. On the previous abdominal CT, this was felt likely secondary to pancreatitis. Disc levels: T12-L1: Mild bulging of the disc. Posterior bowing of the posterosuperior margin of the L1 vertebral body. No apparent neural compression. L1-2: Disc degeneration with endplate irregularity, similar to the study of 1 month ago, therefore much more likely degenerative than inflammatory. Bulging of the disc. Facet degeneration and hypertrophy worse on the left. Narrowing of the left lateral recess and intervertebral foramen on the left that could possibly cause neural compression. L2-3: Disc degeneration with vacuum phenomenon. Endplate osteophytes and bulging of the disc. Facet and ligamentous hypertrophy. Moderate stenosis of the canal, lateral recesses and neural foramina which could possibly cause neural compression. L3-4: Bulging of the disc. Facet and ligamentous hypertrophy worse on the right. Stenosis of the lateral recesses and foramina right more than left. Some potential for neural compression on the right. L4 to sacrum: Previous fusion procedure. Solid union with wide patency of the canal and  foramina. No evidence of sacral fracture IMPRESSION: 1. Distant discectomy and fusion from L4 to the sacrum with solid union. No evidence of hardware complication. 2. Compression deformity of the L1 vertebral body with loss of height anteriorly of 40%. Mild posterior bowing of the posterosuperior margin of the vertebral body. No apparent progression with compared to the CT study of 1 month ago or even when compared to the study of April 2024. 3. L1-2: Disc degeneration with endplate irregularity, similar to the study of 1 month ago, therefore much more likely degenerative than inflammatory. Bulging of the disc. Facet degeneration and hypertrophy worse on the left. Narrowing of the left lateral recess and intervertebral foramen on the left that could possibly cause neural compression. 4. L2-3: Disc degeneration with vacuum phenomenon. Endplate osteophytes and bulging of the disc. Facet and ligamentous hypertrophy. Moderate stenosis of the canal, lateral recesses and neural foramina which could possibly cause neural compression. 5. L3-4: Bulging of the disc.  Facet and ligamentous hypertrophy worse on the right. Stenosis of the lateral recesses and foramina right more than left. Some potential for neural compression on the right. 6. Moderate amount of free fluid in the pelvis, etiology not specifically demonstrated on this study. On the previous abdominal CT, this was felt likely secondary to pancreatitis. Aortic Atherosclerosis (ICD10-I70.0). Electronically Signed   By: Paulina Fusi M.D.   On: 10/23/2023 16:41        Scheduled Meds:  carbamazepine  100 mg Oral BID   DULoxetine  90 mg Oral Daily   enoxaparin (LOVENOX) injection  40 mg Subcutaneous Q24H   feeding supplement  237 mL Oral BID BM   gabapentin  300 mg Oral QID   ketorolac  15 mg Intravenous Q6H   [START ON 11/21/2023] lamoTRIgine  100 mg Oral BID   lamoTRIgine  50 mg Oral Daily   [START ON 11/07/2023] lamoTRIgine  50 mg Oral BID    levETIRAcetam  1,000 mg Oral BID   Followed by   Melene Muller ON 10/26/2023] levETIRAcetam  500 mg Oral BID   melatonin  10 mg Oral QHS   mirtazapine  30 mg Oral QHS   modafinil  200 mg Oral Daily   nicotine  14 mg Transdermal Daily   pantoprazole  40 mg Oral Daily   polyethylene glycol  17 g Oral Daily   Continuous Infusions:   LOS: 0 days     Tresa Moore, MD [facility]   If 7PM-7AM, please contact night-coverage  10/25/2023, 2:32 PM

## 2023-10-25 NOTE — Plan of Care (Signed)
  Problem: Education: Goal: Knowledge of General Education information will improve Description: Including pain rating scale, medication(s)/side effects and non-pharmacologic comfort measures Outcome: Progressing   Problem: Health Behavior/Discharge Planning: Goal: Ability to manage health-related needs will improve Outcome: Progressing   Problem: Clinical Measurements: Goal: Ability to maintain clinical measurements within normal limits will improve Outcome: Progressing Goal: Will remain free from infection Outcome: Progressing Goal: Diagnostic test results will improve Outcome: Progressing Goal: Respiratory complications will improve Outcome: Progressing Goal: Cardiovascular complication will be avoided Outcome: Progressing   Problem: Clinical Measurements: Goal: Respiratory complications will improve Outcome: Progressing   Problem: Clinical Measurements: Goal: Diagnostic test results will improve Outcome: Progressing   Problem: Elimination: Goal: Will not experience complications related to bowel motility Outcome: Progressing Goal: Will not experience complications related to urinary retention Outcome: Progressing   Problem: Pain Managment: Goal: General experience of comfort will improve and/or be controlled Outcome: Progressing   Problem: Safety: Goal: Ability to remain free from injury will improve Outcome: Progressing   Problem: Skin Integrity: Goal: Risk for impaired skin integrity will decrease Outcome: Progressing

## 2023-10-25 NOTE — TOC Initial Note (Signed)
 Transition of Care [facility]) - Initial/Assessment Note    Patient Details  Name: Abigail [name] [facility] MRN: [id number] Date of Birth: [birth year]  Transition of Care [facility]) CM/SW Contact:    Erin Sons, LCSW Phone Number: 10/25/2023, 12:22 PM  Clinical Narrative:                  CSW met with pt in response to consult for financial and transportation assistance. Pt denies financial and transportation needs. She then explained that she recently declined SNF due to not liking her bed offers. If SNF is recommended again she would be interested in seeing which facilities can offer but if she does not like them she will likely go home with [facility]. CSW explained disposition rec would come from PT/OT. She is agreeable to CSW working up for SNF if therapy recommends SNF. Otherwise she is content with returning [facility] with [facility]. She is active with [facility] and has all DME she needs. TOC will continue to follow.     Expected Discharge Plan: Home w Home Health Services Barriers to Discharge: Continued Medical Work up               Activities of Daily Living   ADL Screening (condition at time of admission) Independently performs ADLs?: No Does the patient have a NEW difficulty with bathing/dressing/toileting/self-feeding that is expected to last >3 days?: Yes (Initiates electronic notice to provider for possible OT consult) Does the patient have a NEW difficulty with getting in/out of bed, walking, or climbing stairs that is expected to last >3 days?: Yes (Initiates electronic notice to provider for possible PT consult) Does the patient have a NEW difficulty with communication that is expected to last >3 days?: No Is the patient deaf or have difficulty hearing?: No Does the patient have difficulty seeing, even when wearing glasses/contacts?: No Does the patient have difficulty concentrating, remembering, or making decisions?: No  Permission Sought/Granted                  Emotional  Assessment Appearance:: Appears stated age Attitude/Demeanor/Rapport: Engaged Affect (typically observed): Accepting Orientation: : Oriented to Self, Oriented to Place, Oriented to  Time, Oriented to Situation Alcohol / Substance Use: Not Applicable Psych Involvement: No (comment)  Admission diagnosis:  Hypokalemia [E87.6] Acute respiratory failure with hypoxia (HCC) [J96.01] Patient Active Problem List   Diagnosis Date Noted   Impaired ambulation 10/23/2023   Aspiration pneumonia (HCC) 10/08/2023   Hyponatremia 09/27/2023   Chronic respiratory failure with hypoxia (HCC) 07/29/2023   Epilepsy (HCC) 06/28/2023   Osteopenia 05/29/2023   Protein-calorie malnutrition, severe 12/21/2022   Giant cell arteritis (HCC) 12/19/2022   DNR (do not resuscitate) 12/19/2022   Status epilepticus (HCC) 12/18/2022   Hypokalemia 12/08/2022   Acute metabolic encephalopathy 12/07/2022   Idiopathic acute pancreatitis without infection or necrosis 08/08/2022   Tobacco use disorder 08/08/2022   Asthma 04/26/2020   Drug-induced constipation 04/26/2020   Screening for colon cancer 04/26/2020   GERD (gastroesophageal reflux disease) 11/12/2016   Chronic prescription opiate use - thru [facility] in [place], [place] 04/29/2016   Long term current use of immunosuppressive drug 04/29/2016   Depression 02/16/2013   Anxiety 02/13/2013   Chronic pain 02/13/2013   DDD (degenerative disc disease), cervical 02/13/2013   Rheumatoid arthritis (HCC) 02/13/2013   PCP:  Alease Medina, MD Pharmacy:   [facility] [phone] - [place], [place] - [address] [place] [address] Phone: [phone] Fax: [phone]  [facility] [address] [place] [address] Phone: [phone] Fax: [phone]  [facility] [address] [place] [address] Phone: [phone] Fax: [phone]     Social Drivers of Health (SDOH) Social  History: SDOH Screenings   Food Insecurity: Food Insecurity Present (10/23/2023)  Housing: High Risk (10/23/2023)  Transportation Needs: Unmet Transportation Needs (10/23/2023)  Utilities: Not At Risk (10/23/2023)  Depression (PHQ2-9): High Risk (09/18/2023)  Social Connections: Unknown (10/23/2023)  Recent Concern: Social Connections - Socially Isolated (10/09/2023)  Tobacco Use: High Risk (10/23/2023)   SDOH Interventions:     Readmission Risk Interventions    09/26/2023    4:06 PM 07/31/2023    1:07 PM  Readmission Risk Prevention Plan  Transportation Screening Complete Complete  Medication Review Oceanographer) Complete Complete  PCP or Specialist appointment within 3-5 days of discharge  Complete  HRI or Home Care Consult  Complete  SW Recovery Care/Counseling Consult Complete Complete  Palliative Care Screening Not Applicable Not Applicable  Skilled Nursing Facility Not Applicable Not Applicable

## 2023-10-25 NOTE — Evaluation (Signed)
 Physical Therapy Evaluation Patient Details Name: Abigail [name] [facility] MRN: [id number] DOB: 07-11-1966 Today's Date: 10/25/2023  History of Present Illness  Pt is a 58 y.o. female presenting to hospital 10/23/23 with c/o SOB; also with chronic LBP (worsening; radiating to L calf and lateral side of L foot); diarrhea; recent hospitalization for seizures on October 16, 2023.  Pt admitted with acute on chronic hypoxic respiratory failure, hypokalemia, acute on chronic ambulatory impairment, uncompensated combined metabolic alkalosis and respiratory acidosis, and severe hypoalbuminemia.  PMH includes RA, seizure disorder, aspiration PNA, recurrent pancreatitis, mild intermittent asthma, chronic hypoxic respiratory failure on 2 L, fibromyalgia, IBS, C-spine surgery, L-spine sx.  Clinical Impression  Prior to recent medical concerns, pt reports being modified independent with functional mobility (uses SW vs scooter vs no AD depending on how she is feeling); lives with her son in 1 level home with ramp to enter.  7/10 LBP throughout session.  Currently pt is modified independent with bed mobility; CGA with transfer from bed and CGA to ambulate 50 feet with RW use.  Pt's R knee buckled towards end of ambulation but pt able to self correct with RW use.  Of note, pt's SpO2 sats 89% on room air at rest beginning of session; decreased to 84% with ambulation; 92% at rest end of session.  Pt's nurse and MD updated on pt's status (including SpO2 sats during session).  Pt would currently benefit from skilled PT to address noted impairments and functional limitations (see below for any additional details).  Upon hospital discharge, pt would benefit from ongoing therapy.     If plan is discharge home, recommend the following: A little help with walking and/or transfers;A little help with bathing/dressing/bathroom;Assistance with cooking/housework;Assist for transportation;Help with stairs or ramp for entrance   Can  travel by private vehicle   Yes    Equipment Recommendations Rolling walker (2 wheels)  Recommendations for Other Services       Functional Status Assessment Patient has had a recent decline in their functional status and demonstrates the ability to make significant improvements in function in a reasonable and predictable amount of time.     Precautions / Restrictions Precautions Precautions: Fall Recall of Precautions/Restrictions: Intact Restrictions Weight Bearing Restrictions Per Provider Order: No      Mobility  Bed Mobility Overal bed mobility: Needs Assistance Bed Mobility: Rolling, Sidelying to Sit, Sit to Sidelying Rolling: Modified independent (Device/Increase time) Sidelying to sit: Modified independent (Device/Increase time)     Sit to sidelying: Modified independent (Device/Increase time) General bed mobility comments: vc's for logrolling technique    Transfers Overall transfer level: Needs assistance Equipment used: Rolling walker (2 wheels) Transfers: Sit to/from Stand Sit to Stand: Contact guard assist           General transfer comment: fairly strong stand up to RW    Ambulation/Gait Ambulation/Gait assistance: Contact guard assist Gait Distance (Feet): 50 Feet Assistive device: Rolling walker (2 wheels) Gait Pattern/deviations: Step-through pattern, Decreased step length - right, Decreased step length - left Gait velocity: decreased     General Gait Details: steady ambulation in general using RW; pt did report R knee buckled towards end of ambulation but pt able to self correct with walker use  Stairs            Wheelchair Mobility     Tilt Bed    Modified Rankin (Stroke Patients Only)       Balance Overall balance assessment: Needs assistance Sitting-balance support: No upper  extremity supported, Feet supported Sitting balance-Leahy Scale: Good Sitting balance - Comments: steady reaching within BOS   Standing balance  support: Bilateral upper extremity supported, During functional activity, Reliant on assistive device for balance Standing balance-Leahy Scale: Good Standing balance comment: steady ambulating with RW use                             Pertinent Vitals/Pain Pain Assessment Pain Assessment: 0-10 Pain Score: 7  Pain Location: LBP Pain Descriptors / Indicators: Aching, Discomfort Pain Intervention(s): Limited activity within patient's tolerance, Monitored during session, Premedicated before session, Repositioned HR stable during sessions activities.    Home Living Family/patient expects to be discharged to:: Private residence Living Arrangements: Children (Pt's son) Available Help at Discharge: Family Type of Home: Mobile home Home Access: Ramped entrance       Home Layout: One level Home Equipment: Agricultural consultant (2 wheels);Wheelchair - Set designer (4 wheels);Standard Walker      Prior Function Prior Level of Function : Needs assist             Mobility Comments: Pt uses SW vs scooter vs no AD depending on how she is feeling; uses 2 L home O2 at night ADLs Comments: Per OT eval "pt reports she performs feeding, grooming, dressing, toileting with MOD I, has not gotten in the shower "in awhile" uses the bath wipes instead; assist for IADLs from son, mom, or friend"     Extremity/Trunk Assessment   Upper Extremity Assessment Upper Extremity Assessment: Overall WFL for tasks assessed    Lower Extremity Assessment Lower Extremity Assessment: Generalized weakness    Cervical / Trunk Assessment Cervical / Trunk Assessment: Kyphotic  Communication   Communication Communication: No apparent difficulties    Cognition Arousal: Alert Behavior During Therapy: WFL for tasks assessed/performed   PT - Cognitive impairments: No apparent impairments                         Following commands: Intact       Cueing Cueing  Techniques: Verbal cues     General Comments  Nursing cleared pt for participation in physical therapy.  Pt agreeable to PT session.    Exercises     Assessment/Plan    PT Assessment Patient needs continued PT services  PT Problem List Decreased strength;Decreased activity tolerance;Decreased balance;Decreased mobility;Pain       PT Treatment Interventions DME instruction;Gait training;Functional mobility training;Therapeutic activities;Therapeutic exercise;Balance training;Patient/family education    PT Goals (Current goals can be found in the Care Plan section)  Acute Rehab PT Goals Patient Stated Goal: to go home PT Goal Formulation: With patient Time For Goal Achievement: 11/08/23 Potential to Achieve Goals: Good    Frequency Min 2X/week     Co-evaluation               AM-PAC PT "6 Clicks" Mobility  Outcome Measure Help needed turning from your back to your side while in a flat bed without using bedrails?: None Help needed moving from lying on your back to sitting on the side of a flat bed without using bedrails?: A Little Help needed moving to and from a bed to a chair (including a wheelchair)?: A Little Help needed standing up from a chair using your arms (e.g., wheelchair or bedside chair)?: A Little Help needed to walk in hospital room?: A Little Help needed climbing 3-5 steps with a railing? : A  Lot 6 Click Score: 18    End of Session Equipment Utilized During Treatment: Gait belt Activity Tolerance: No increased pain;Patient tolerated treatment well Patient left: in bed;with call bell/phone within reach;with bed alarm set Nurse Communication: Mobility status;Precautions;Other (comment) (Pt's pain status and SpO2 sats during session) PT Visit Diagnosis: Other abnormalities of gait and mobility (R26.89);Muscle weakness (generalized) (M62.81);Pain Pain - part of body:  (back pain)    Time: 1610-9604 PT Time Calculation (min) (ACUTE ONLY): 28  min   Charges:   PT Evaluation $PT Eval Low Complexity: 1 Low PT Treatments $Therapeutic Activity: 8-22 mins PT General Charges $$ ACUTE PT VISIT: 1 Visit        Hendricks Limes, PT 10/25/23, 6:20 PM

## 2023-10-25 NOTE — Plan of Care (Signed)
  Problem: Education: Goal: Knowledge of General Education information will improve Description: Including pain rating scale, medication(s)/side effects and non-pharmacologic comfort measures Outcome: Progressing   Problem: Health Behavior/Discharge Planning: Goal: Ability to manage health-related needs will improve Outcome: Progressing   Problem: Activity: Goal: Risk for activity intolerance will decrease Outcome: Progressing   Problem: Nutrition: Goal: Adequate nutrition will be maintained Outcome: Progressing   Problem: Coping: Goal: Level of anxiety will decrease Outcome: Progressing   Problem: Elimination: Goal: Will not experience complications related to bowel motility Outcome: Progressing   Problem: Pain Managment: Goal: General experience of comfort will improve and/or be controlled Outcome: Progressing   Problem: Safety: Goal: Ability to remain free from injury will improve Outcome: Progressing   Problem: Skin Integrity: Goal: Risk for impaired skin integrity will decrease Outcome: Progressing

## 2023-10-26 DIAGNOSIS — E876 Hypokalemia: Secondary | ICD-10-CM | POA: Diagnosis not present

## 2023-10-26 MED ORDER — LIDOCAINE 4 % EX PTCH: 2.0000 | MEDICATED_PATCH | CUTANEOUS | 0 refills | Status: DC

## 2023-10-26 MED ORDER — LAMOTRIGINE 25 MG PO TABS
ORAL_TABLET | ORAL | 0 refills | Status: DC
Start: 2023-10-27 — End: 2024-01-02

## 2023-10-26 MED ORDER — MORPHINE SULFATE 15 MG PO TABS: 15.0000 mg | ORAL_TABLET | Freq: Three times a day (TID) | ORAL | Status: DC | PRN

## 2023-10-26 MED ORDER — LEVETIRACETAM 500 MG PO TABS: 500.0000 mg | ORAL_TABLET | Freq: Two times a day (BID) | ORAL | 0 refills | Status: DC

## 2023-10-26 MED ORDER — MORPHINE SULFATE 15 MG PO TABS: 15.0000 mg | ORAL_TABLET | Freq: Three times a day (TID) | ORAL | 0 refills | Status: DC | PRN

## 2023-10-26 NOTE — Progress Notes (Addendum)
 Patient is alert and oriented X 4. Discharge instructions given. No any questions at this time. Peripheral I/v removed.

## 2023-10-26 NOTE — TOC Transition Note (Signed)
 Transition of Care [facility]) - Discharge Note   Patient Details  Name: Abigail [name] MRN: [id number] Date of Birth: 1966-05-28  Transition of Care [facility] CM/SW Contact:  Bing Quarry, RN Phone Number: 10/26/2023, 11:56 AM   Clinical Narrative:  3/8: Patient admitted with hypokalemia diagnosis on 10/23/23. Discharge orders for today to home with home health via [facility] as was active prior to admission. No new DME and has needed DME at home. Was consulted by prior SW for SDOH needs, which she declined need on 10/25/23. Has PCP.    Gabriel Cirri MSN RN CM  RN Case Manager [place]  Transitions of Care Direct Dial: [phone] (Weekends Only) [facility] Main Office Phone: [phone] [facility] Fax: [phone] [place]     Final next level of care: Home w Home Health Services Barriers to Discharge: Barriers Resolved   Patient Goals and CMS Choice            Discharge Placement                       Discharge Plan and Services Additional resources added to the After Visit Summary for                  DME Arranged: N/A DME Agency: NA       HH Arranged: PT, OT HH Agency: [facility]     Representative spoke with at [facility] Agency:  (She is active with [facility] and has all DME she needs)  Social Drivers of Health (SDOH) Interventions SDOH Screenings   Food Insecurity: Food Insecurity Present (10/23/2023)  Housing: High Risk (10/23/2023)  Transportation Needs: Unmet Transportation Needs (10/23/2023)  Utilities: Not At Risk (10/23/2023)  Depression (PHQ2-9): High Risk (09/18/2023)  Social Connections: Unknown (10/23/2023)  Recent Concern: Social Connections - Socially Isolated (10/09/2023)  Tobacco Use: High Risk (10/23/2023)     Readmission Risk Interventions    09/26/2023    4:06 PM 07/31/2023    1:07 PM  Readmission Risk Prevention Plan  Transportation Screening Complete Complete  Medication Review Oceanographer) Complete Complete  PCP or Specialist  appointment within 3-5 days of discharge  Complete  HRI or Home Care Consult  Complete  SW Recovery Care/Counseling Consult Complete Complete  Palliative Care Screening Not Applicable Not Applicable  Skilled Nursing Facility Not Applicable Not Applicable

## 2023-10-26 NOTE — Discharge Summary (Signed)
 Physician Discharge Summary  Dietra Stokely [facility] WGN:[id number] DOB: 1965/10/26 DOA: 10/23/2023  PCP: Alease Medina, MD  Admit date: 10/23/2023 Discharge date: 10/26/2023  Admitted From: Home Disposition:  Home w home health  Recommendations for Outpatient Follow-up:  Follow up with PCP in 1-2 weeks Follow up pain management 1 week  Home Health: Yes PT OT Equipment/Devices:None   Discharge Condition:Stable  CODE STATUS:FULL  Diet recommendation: Reg  Brief/Interim Summary:  58 y.o. female with medical history significant of GCA off steroid, seizure with status epilepticus, chronic back pain on narcotics, recurrent pancreatitis, mild intermittent asthma, chronic hypoxic respiratory failure on 2 L continuously, presented with multiple complaints including sudden worsening of back pain, generalized weakness, and ongoing diarrhea.   Symptoms started 3 to 4 days ago, patient suddenly developed worsening of back pain.  At baseline she does have chronic back pain and she had surgery of fusion on L4-L5 and L5-S1 10 years ago.  She has been taking narcotic at home.  Over the weekend she started to have sudden worsening of back pain radiating to left calf and lateral side of left foot, and she has been limping at home because of the pain.  1 week ago she also started to have loose diarrhea, nonsmelly nonbloody denied any abdominal pain.  Today she called EMS, EMS arrived and found patient O2 saturation in 70s on room air.     Discharge Diagnoses:  Principal Problem:   Hypokalemia Active Problems:   Impaired ambulation   Ambulatory dysfunction   Acute on chronic hypoxic respiratory failure -Suspect this is due to atelectasis from acute ambulatory impairment secondary to severe back pain.  Hypoxia improved after initial oxygen treatment.  Will add incentive spirometry and encourage activity as tolerated -Other DDx, no acute infiltrates on chest x-ray, low suspicion for pneumonia.   Monitor off antibiotics Plan: On room air at time of discharge   Hypokalemia -Likely secondary to subacute diarrhea probably caused by stool softener and laxative. -IV and p.o. replacement, check magnesium level -Potassium levels have improved -Back in normal range on 3/7 Plan: Potassium back in reference range at time of discharge   Acute on chronic ambulatory impairment -Lumbar CT with severe degenerative disc disease -Ambulation efforts limited by pain Plan: Multimodal pain control Continue therapy efforts In the recommendation for skilled nursing facility.  After titration of pain regimen and restarting of previously dosed immediate release morphine patient did much better working with therapy and recommendation was upgraded to home with home health.  At time of discharge I prescribed 1 week of morphine immediate release 15 mg every 8 hours as needed.  I explained to patient that I could not provide any refills and could not make any guarantees that her outpatient pain physician would agree with my recommendation we would carry through this prescription.  She expresses understanding.  She is scheduled to see her outpatient pain doctor 1 week from discharge.     COPD/asthma -No symptoms or signs of acute exacerbation   Severe hypoalbuminemia -Check prealbumin level -No history of liver disease, clinically suspect malabsorption from chronic pancreatic insufficiency.  Check fecal elastase.  Hold Creon until this is confirmed   Seizure disorder -Multiple agents.  Patient is concerned that Keppra may be triggering visual deficits and visual hallucinations.  Case discussed with Dr. Wilford Corner neurology who made recommendations for medication titration.  These have been reflected on her discharge medication reconciliation.  Discharge Instructions  Discharge Instructions     Diet -  low sodium heart healthy   Complete by: As directed    Increase activity slowly   Complete by: As directed        Allergies as of 10/26/2023       Reactions   Nsaids Hives   Tapentadol Swelling, Rash, Other (See Comments)   Nucynta- Made her deathly sick   Cephalexin Other (See Comments)   Reaction not cited   Codeine Other (See Comments)   Reaction not cited   Darvocet [propoxyphene N-acetaminophen] Other (See Comments)   Reaction not cited   Latex Other (See Comments)   Reaction not cited   Silicone Other (See Comments)   Reaction not cited   Sulfa Antibiotics Hives   Tape Other (See Comments)   Reaction not cited   Meloxicam Rash        Medication List     STOP taking these medications    lacosamide 200 MG Tabs tablet Commonly known as: VIMPAT   oxyCODONE 5 MG immediate release tablet Commonly known as: Oxy IR/ROXICODONE       TAKE these medications    bisacodyl 5 MG EC tablet Generic drug: bisacodyl Take 1 tablet (5 mg total) by mouth daily as needed for moderate constipation.   carbamazepine 100 MG 12 hr tablet Commonly known as: TEGRETOL XR Take 1 tablet (100 mg total) by mouth 2 (two) times daily.   DULoxetine 30 MG capsule Commonly known as: CYMBALTA Take 3 capsules (90 mg total) by mouth daily.   feeding supplement Liqd Take 237 mLs by mouth 2 (two) times daily between meals.   gabapentin 300 MG capsule Commonly known as: NEURONTIN Take 1 capsule (300 mg total) by mouth 4 (four) times daily.   lamoTRIgine 25 MG tablet Commonly known as: LAMICTAL Take 2 tablets (50 mg total) by mouth daily for 12 days, THEN 2 tablets (50 mg total) 2 (two) times daily for 14 days, THEN 4 tablets (100 mg total) 2 (two) times daily. Start taking on: October 27, 2023   levETIRAcetam 500 MG tablet Commonly known as: KEPPRA Take 1 tablet (500 mg total) by mouth 2 (two) times daily for 3 doses. What changed:  medication strength how much to take   lidocaine 4 % Place 2 patches onto the skin daily. Remove & Discard patch within 12 hours or as directed by MD    Melatonin 10 MG Tabs Take 10 mg by mouth at bedtime.   mirtazapine 30 MG tablet Commonly known as: REMERON Take 1 tablet (30 mg total) by mouth at bedtime.   modafinil 200 MG tablet Commonly known as: PROVIGIL Take 200 mg by mouth daily.   morphine 15 MG tablet Commonly known as: MSIR Take 1 tablet (15 mg total) by mouth every 8 (eight) hours as needed for up to 7 days for severe pain (pain score 7-10).   nicotine 14 mg/24hr patch Commonly known as: NICODERM CQ - dosed in mg/24 hours One patch chest wall daily (okay to substitute generic)   NON FORMULARY Apply 1 Application topically 3 (three) times daily as needed. Rock Salt Cream (Similar to [employer])   ondansetron 8 MG disintegrating tablet Commonly known as: ZOFRAN-ODT Take 1 tablet (8 mg total) by mouth every 8 (eight) hours as needed for nausea.   pantoprazole 40 MG tablet Commonly known as: PROTONIX Take 40 mg by mouth daily.   polyethylene glycol powder 17 GM/SCOOP powder Commonly known as: GLYCOLAX/MIRALAX Mix 17 g with a beverage and drink once daily.  Stimulant Laxative 8.6-50 MG tablet Generic drug: senna-docusate Take 1 tablet by mouth 2 (two) times daily.        Allergies  Allergen Reactions   Nsaids Hives   Tapentadol Swelling, Rash and Other (See Comments)    Nucynta- Made her deathly sick   Cephalexin Other (See Comments)    Reaction not cited   Codeine Other (See Comments)    Reaction not cited   Darvocet [Propoxyphene N-Acetaminophen] Other (See Comments)    Reaction not cited   Latex Other (See Comments)    Reaction not cited   Silicone Other (See Comments)    Reaction not cited   Sulfa Antibiotics Hives   Tape Other (See Comments)    Reaction not cited   Meloxicam Rash    Consultations: None   Procedures/Studies: CT LUMBAR SPINE WO CONTRAST Result Date: 10/23/2023 CLINICAL DATA:  Low back pain.  Increased fracture risk. EXAM: CT LUMBAR SPINE WITHOUT CONTRAST TECHNIQUE:  Multidetector CT imaging of the lumbar spine was performed without intravenous contrast administration. Multiplanar CT image reconstructions were also generated. RADIATION DOSE REDUCTION: This exam was performed according to the departmental dose-optimization program which includes automated exposure control, adjustment of the mA and/or kV according to patient size and/or use of iterative reconstruction technique. COMPARISON:  11/29/2011 lumbar CT. Abdomen pelvis CT 09/24/2023. Abdomen CT 07/29/2023. Abdomen CT 12/14/2022. FINDINGS: Segmentation: 5 lumbar type vertebral bodies. Alignment: S shaped scoliotic curvature of the thoracolumbar spine. Vertebrae: Distant discectomy and fusion from L4 to the sacrum with solid union. No evidence of hardware complication. Compression deformity of the L1 vertebral body with loss of height anteriorly of 40%. Mild posterior bowing of the posterosuperior margin of the vertebral body. No apparent progression with compared to the CT study of 1 month ago. Degenerative endplate changes of L2, similar to the prior study. Paraspinal and other soft tissues: No evidence of paravertebral inflammatory changes. Aortic atherosclerosis incidentally noted. Moderate amount of free fluid in the pelvis, etiology not specifically demonstrated on this study. On the previous abdominal CT, this was felt likely secondary to pancreatitis. Disc levels: T12-L1: Mild bulging of the disc. Posterior bowing of the posterosuperior margin of the L1 vertebral body. No apparent neural compression. L1-2: Disc degeneration with endplate irregularity, similar to the study of 1 month ago, therefore much more likely degenerative than inflammatory. Bulging of the disc. Facet degeneration and hypertrophy worse on the left. Narrowing of the left lateral recess and intervertebral foramen on the left that could possibly cause neural compression. L2-3: Disc degeneration with vacuum phenomenon. Endplate osteophytes and  bulging of the disc. Facet and ligamentous hypertrophy. Moderate stenosis of the canal, lateral recesses and neural foramina which could possibly cause neural compression. L3-4: Bulging of the disc. Facet and ligamentous hypertrophy worse on the right. Stenosis of the lateral recesses and foramina right more than left. Some potential for neural compression on the right. L4 to sacrum: Previous fusion procedure. Solid union with wide patency of the canal and foramina. No evidence of sacral fracture IMPRESSION: 1. Distant discectomy and fusion from L4 to the sacrum with solid union. No evidence of hardware complication. 2. Compression deformity of the L1 vertebral body with loss of height anteriorly of 40%. Mild posterior bowing of the posterosuperior margin of the vertebral body. No apparent progression with compared to the CT study of 1 month ago or even when compared to the study of April 2024. 3. L1-2: Disc degeneration with endplate irregularity, similar to the study of  1 month ago, therefore much more likely degenerative than inflammatory. Bulging of the disc. Facet degeneration and hypertrophy worse on the left. Narrowing of the left lateral recess and intervertebral foramen on the left that could possibly cause neural compression. 4. L2-3: Disc degeneration with vacuum phenomenon. Endplate osteophytes and bulging of the disc. Facet and ligamentous hypertrophy. Moderate stenosis of the canal, lateral recesses and neural foramina which could possibly cause neural compression. 5. L3-4: Bulging of the disc. Facet and ligamentous hypertrophy worse on the right. Stenosis of the lateral recesses and foramina right more than left. Some potential for neural compression on the right. 6. Moderate amount of free fluid in the pelvis, etiology not specifically demonstrated on this study. On the previous abdominal CT, this was felt likely secondary to pancreatitis. Aortic Atherosclerosis (ICD10-I70.0). Electronically Signed    By: Paulina Fusi M.D.   On: 10/23/2023 16:41   DG Chest Port 1 View Result Date: 10/23/2023 CLINICAL DATA:  Shortness of breath. EXAM: PORTABLE CHEST 1 VIEW COMPARISON:  October 08, 2023. FINDINGS: The heart size and mediastinal contours are within normal limits. Hypoinflation of the lungs is noted. Mild bibasilar subsegmental atelectasis is noted. The visualized skeletal structures are unremarkable. IMPRESSION: Hypoinflation of the lungs with mild bibasilar subsegmental atelectasis. Electronically Signed   By: Lupita Raider M.D.   On: 10/23/2023 14:31   DG Abdomen 1 View Result Date: 10/08/2023 CLINICAL DATA:  NG tube placement EXAM: ABDOMEN - 1 VIEW COMPARISON:  CT 09/24/2023 FINDINGS: Enteric tube tip overlies the mid stomach. Upper gas pattern is unremarkable. Excreted contrast in the renal collecting systems. Bibasilar airspace disease and probable pleural effusions IMPRESSION: Enteric tube tip overlies the mid stomach. Electronically Signed   By: Jasmine Pang M.D.   On: 10/08/2023 17:57   DG Chest Portable 1 View Result Date: 10/08/2023 CLINICAL DATA:  eval for aspiration Seizure. EXAM: PORTABLE CHEST 1 VIEW COMPARISON:  Radiograph 07/14/2023, CT 07/15/2023 FINDINGS: Low lung volumes and anti lordotic positioning limit assessment. Bibasilar volume loss with streaky bibasilar opacities, favor atelectasis. Stable heart size and mediastinal contours allowing for differences in technique. No pneumothorax. Difficult to assess for effusion on the current exam. IMPRESSION: Very low lung volumes limiting assessment. Streaky bibasilar opacities are similar to prior exam, favor atelectasis. Electronically Signed   By: Narda Rutherford M.D.   On: 10/08/2023 15:25   EEG adult Result Date: 10/08/2023 [name], MD     10/08/2023 12:19 PM Patient Name: Abigail [name] [facility] MRN: [id number] Epilepsy Attending: [name] Referring Physician/Provider: Caryl Pina, MD Date: 10/08/2023 Duration:  34.42 mins Patient history:  58 yo F brought in after being found seizing by her son at home at about 5 AM this morning. EEG to evaluate for seizure Level of alertness: Awake, asleep AEDs during EEG study: LEV, CBZ Technical aspects: This EEG study was done with scalp electrodes positioned according to the 10-20 International system of electrode placement. Electrical activity was reviewed with band pass filter of 1-70Hz , sensitivity of 7 uV/mm, display speed of 69mm/sec with a 60Hz  notched filter applied as appropriate. EEG data were recorded continuously and digitally stored.  Video monitoring was available and reviewed as appropriate. Description: The posterior dominant rhythm consists of 7 Hz activity of moderate voltage (25-35 uV) seen predominantly in posterior head regions, symmetric and reactive to eye opening and eye closing. Sleep was characterized by vertex waves, sleep spindles (12 to 14 Hz), maximal frontocentral region. EEG showed continuous generalized polymorphic  sharply contoured 5 to 7 Hz theta slowing. Hyperventilation and photic stimulation were not performed.   ABNORMALITY - Continuous slow, generalized IMPRESSION: This study is suggestive of mild to moderate diffuse encephalopathy. No seizures or epileptiform discharges were seen throughout the recording. [name]   CT ANGIO HEAD NECK W WO CM W PERF Result Date: 10/08/2023 CLINICAL DATA:  Provided history: Neuro deficit, acute, stroke suspected. Additional history provided: Seizure with subsequent dense left hemiparesis. EXAM: CT ANGIOGRAPHY HEAD AND NECK CT PERFUSION BRAIN TECHNIQUE: Multidetector CT imaging of the head and neck was performed using the standard protocol during bolus administration of intravenous contrast. Multiplanar CT image reconstructions and MIPs were obtained to evaluate the vascular anatomy. Carotid stenosis measurements (when applicable) are obtained utilizing NASCET criteria, using the distal internal carotid  diameter as the denominator. Multiphase CT imaging of the brain was performed following IV bolus contrast injection. Subsequent parametric perfusion maps were calculated using RAPID software. RADIATION DOSE REDUCTION: This exam was performed according to the departmental dose-optimization program which includes automated exposure control, adjustment of the mA and/or kV according to patient size and/or use of iterative reconstruction technique. CONTRAST:  OMNIPAQUE IOHEXOL 350 MG/ML SOLN COMPARISON:  Non-contrast head CT performed earlier today 10/08/2023. FINDINGS: CTA NECK FINDINGS Aortic arch: Standard aortic branching. The visualized thoracic aorta is normal in caliber. No hemodynamically significant innominate or proximal subclavian artery stenosis. Right carotid system: CCA and ICA patent within the neck without atherosclerotic stenosis. Mild atherosclerotic plaque about the carotid bifurcation and within the proximal ICA. A 50% narrowing of the proximal CCA appears to be related to vessel tortuosity rather than atherosclerotic plaque. Tortuosity of the mid to distal cervical ICA. No evidence of dissection. Left carotid system: CCA and ICA patent within the neck without stenosis. Mild atherosclerotic plaque at the carotid bifurcation. No evidence of dissection. Vertebral arteries: Codominant and patent within the neck without stenosis. Skeleton: Levocurvature of the cervical spine. Partially imaged dextrocurvature of the mid/upper thoracic spine. Prior C5-C7 ACDF. Cervical spondylosis. No acute fracture or aggressive osseous lesion. Other neck: No neck mass or cervical lymphadenopathy. Upper chest: Motion degradation limits evaluation of the imaged lung apices. Atelectasis within the dependent aspect of the right upper lobe at the imaged levels. Review of the MIP images confirms the above findings CTA HEAD FINDINGS Anterior circulation: The intracranial internal carotid arteries are patent. Nonstenotic  atherosclerotic plaque within the right ICA cavernous segment The M1 middle cerebral arteries are patent. No M2 proximal branch occlusion or high-grade proximal stenosis. The anterior cerebral arteries are patent. No intracranial aneurysm is identified. Posterior circulation: The intracranial vertebral arteries are patent. The basilar artery is patent. The posterior cerebral arteries are patent. A small right posterior communicating artery is present. The left posterior communicating artery is diminutive or absent. Venous sinuses: Within the limitations of contrast timing, no convincing thrombus. Anatomic variants: As described. Review of the MIP images confirms the above findings CT Brain Perfusion Findings: Poor quality of the source data/contrast bolus limits reliability of the perfusion software estimates. Within this limitation, findings are as follows. CBF (<30%) Volume: 0mL Perfusion (Tmax>6.0s) volume: 30mL (scattered within the bilateral frontal and temporal lobes). Mismatch Volume: 30mL Infarction Location:None identified. No emergent large vessel occlusion identified. These results were called by telephone at the time of interpretation on 10/08/2023 at 10:10 am to provider ERIC [facility] , who verbally acknowledged these results. IMPRESSION: CTA neck: 1. The common carotid and internal carotid arteries are patent within the  neck without neck significant atherosclerotic stenosis. Mild atherosclerotic plaque bilaterally, as described. 50% narrowing of the proximal right common carotid artery appears to be related to vessel tortuosity rather than atherosclerotic disease. 2. The vertebral arteries are patent within the neck without stenosis or significant atherosclerotic disease. CTA head: 1. No proximal intracranial large vessel occlusion or high-grade proximal arterial stenosis identified. 2. Non-stenotic atherosclerotic plaque within the right ICA cavernous segment. CT perfusion head: 1. Poor quality of the  source data/contrast bolus limits reliability of the perfusion software estimates. Within this limitation, findings are as follows. 2. The perfusion software identifies no core infarct. The perfusion software identifies regions of hypoperfused parenchyma, totaling 30 mL, scattered within the bilateral frontal and temporal lobes (utilizing the Tmax>6 seconds threshold). Reported mismatch volume: 30 mL. Electronically Signed   By: Jackey Loge D.O.   On: 10/08/2023 10:36   CT Head Wo Contrast Result Date: 10/08/2023 CLINICAL DATA:  Status epilepticus. EXAM: CT HEAD WITHOUT CONTRAST TECHNIQUE: Contiguous axial images were obtained from the base of the skull through the vertex without intravenous contrast. RADIATION DOSE REDUCTION: This exam was performed according to the departmental dose-optimization program which includes automated exposure control, adjustment of the mA and/or kV according to patient size and/or use of iterative reconstruction technique. COMPARISON:  09/29/2023 FINDINGS: Brain: No evidence of intracranial hemorrhage, acute infarction, hydrocephalus, extra-axial collection, or mass lesion/mass effect. Vascular:  No hyperdense vessel or other acute findings. Skull: No evidence of fracture or other significant bone abnormality. Sinuses/Orbits: No acute findings. Chronic opacification of left maxillary sinus again noted. Other: None. IMPRESSION: No acute intracranial abnormality. Chronic left maxillary sinus disease. Electronically Signed   By: Danae Orleans M.D.   On: 10/08/2023 08:15   EEG adult Result Date: 09/30/2023 Jefferson Fuel, MD     09/30/2023  9:03 PM Routine EEG Report [facility] Smelcer is a 58 y.o. female with a history of seizure who is undergoing an EEG to evaluate for seizures.  Report: This EEG was acquired with electrodes placed according to the International 10-20 electrode system (including Fp1, Fp2, F3, F4, C3, C4, P3, P4, O1, O2, T3, T4, T5, T6, A1, A2, Fz, Cz, Pz).  The following electrodes were missing or displaced: none.  The best background was continuous at approximately 5-6 Hz with overriding beta frequencies. This activity is reactive to stimulation. Drowsiness was manifested by background fragmentation; deeper stages of sleep were identified by K complexes and sleep spindles. There was no focal slowing. There were generalized periodic discharges with triphasic morphology that waxed and waned, at time in runs of up to 1-1.5 Hz lasting 2-10 seconds. These did not appear epileptiform. There were no definitive interictal epileptiform discharges. There were no electrographic seizures identified. Photic stimulation and hyperventilation were not performed.  Impression and clinical correlation: This EEG was obtained while awake and asleep and is abnormal due to: - Moderate diffuse slowing indicative of global cerebral dysfunction - Triphasic waves most commonly seen in the setting of metabolic derangement. - There were no definitive epileptiform abnormalities were not seen during this recording. This EEG is unchanged from her most recent EEG 07/12/23.  Bing Neighbors, MD [facility] [phone]   CT HEAD WO CONTRAST ( ) Result Date: 09/29/2023 CLINICAL DATA:  Mental status change, unknown cause. EXAM: CT HEAD WITHOUT CONTRAST TECHNIQUE: Contiguous axial images were obtained from the base of the skull through the vertex without intravenous contrast. RADIATION DOSE REDUCTION: This exam was performed according to the departmental dose-optimization  program which includes automated exposure control, adjustment of the mA and/or kV according to patient size and/or use of iterative reconstruction technique. COMPARISON:  CT head without contrast 07/12/2023. FINDINGS: Brain: No acute infarct, hemorrhage, or mass lesion is present. No significant white matter lesions are present. Deep brain nuclei are within normal limits. The ventricles are of normal size. No significant  extraaxial fluid collection is present. The brainstem and cerebellum are within normal limits. Midline structures are within normal limits. Vascular: No hyperdense vessel or unexpected calcification. Skull: Calvarium is intact. No focal lytic or blastic lesions are present. No significant extracranial soft tissue lesion is present. Sinuses/Orbits: Chronic opacification and atelectasis of the left maxillary sinus is stable. The paranasal sinuses and mastoid air cells are otherwise clear. The globes and orbits are within normal limits. IMPRESSION: 1. Normal CT appearance of the brain. No acute or focal lesion to explain the patient's symptoms. 2. Chronic opacification and atelectasis of the left maxillary sinus. Electronically Signed   By: Marin Roberts M.D.   On: 09/29/2023 18:07      Subjective: Seen and examined on the day of discharge.  Stable no distress.  Appropriate discharge home.  Discharge Exam: Vitals:   10/25/23 1950 10/26/23 0750  BP:  109/65  Pulse: 95 96  Resp:  20  Temp:  97.9 F (36.6 C)  SpO2: 94% 95%   Vitals:   10/25/23 1710 10/25/23 1943 10/25/23 1950 10/26/23 0750  BP: 90/60 (!) 92/57  109/65  Pulse:  92 95 96  Resp:  14  20  Temp:  (!) 97.3 F (36.3 C)  97.9 F (36.6 C)  TempSrc:      SpO2:  93% 94% 95%  Weight:      Height:        General: Pt is alert, awake, not in acute distress Cardiovascular: RRR, S1/S2 +, no rubs, no gallops Respiratory: CTA bilaterally, no wheezing, no rhonchi Abdominal: Soft, NT, ND, bowel sounds + Extremities: no edema, no cyanosis    The results of significant diagnostics from this hospitalization (including imaging, microbiology, ancillary and laboratory) are listed below for reference.     Microbiology: Recent Results (from the past 240 hours)  Blood culture (routine x 2)     Status: None (Preliminary result)   Collection Time: 10/23/23 11:44 AM   Specimen: BLOOD  Result Value Ref Range Status   Specimen  Description BLOOD RIGHT ARM  Final   Special Requests   Final    BOTTLES DRAWN AEROBIC AND ANAEROBIC Blood Culture results may not be optimal due to an inadequate volume of blood received in culture bottles   Culture   Final    NO GROWTH 3 DAYS Performed at [facility], [address], [place], [place] [address]    Report Status PENDING  Incomplete  Blood culture (routine x 2)     Status: None (Preliminary result)   Collection Time: 10/23/23  5:39 PM   Specimen: BLOOD LEFT ARM  Result Value Ref Range Status   Specimen Description BLOOD LEFT ARM  Final   Special Requests   Final    BOTTLES DRAWN AEROBIC AND ANAEROBIC Blood Culture adequate volume   Culture   Final    NO GROWTH 3 DAYS Performed at [facility], [address], [place], [place] [address]    Report Status PENDING  Incomplete     Labs: BNP (last 3 results) Recent Labs    09/29/23 1023 10/23/23 1144  BNP  18.3 22.3   Basic Metabolic Panel: Recent Labs  Lab 10/23/23 1144 10/23/23 1739 10/24/23 0522 10/25/23 0427  NA 137  --  140 137  K <2.0*  --  2.9* 3.5  CL 90*  --  94* 95*  CO2 38*  --  36* 31  GLUCOSE 112*  --  98 84  BUN <5*  --  <5* <5*  CREATININE 0.41*  --  0.38* 0.45  CALCIUM 7.2*  --  7.4* 7.4*  MG  --  1.7  --   --    Liver Function Tests: Recent Labs  Lab 10/23/23 1144  AST 43*  ALT 22  ALKPHOS 96  BILITOT 0.5  PROT 5.9*  ALBUMIN 1.7*   No results for input(s): "LIPASE", "AMYLASE" in the last 168 hours. No results for input(s): "AMMONIA" in the last 168 hours. CBC: Recent Labs  Lab 10/23/23 1144 10/25/23 0427  WBC 3.9* 5.4  NEUTROABS  --  3.0  HGB 11.1* 10.6*  HCT 34.8* 34.7*  MCV 92.8 94.8  PLT 251 201   Cardiac Enzymes: No results for input(s): "CKTOTAL", "CKMB", "CKMBINDEX", "TROPONINI" in the last 168 hours. BNP: Invalid input(s): "POCBNP" CBG: No results for input(s): "GLUCAP" in the last 168 hours. D-Dimer No results for input(s): "DDIMER"  in the last 72 hours. Hgb A1c No results for input(s): "HGBA1C" in the last 72 hours. Lipid Profile No results for input(s): "CHOL", "HDL", "LDLCALC", "TRIG", "CHOLHDL", "LDLDIRECT" in the last 72 hours. Thyroid function studies No results for input(s): "TSH", "T4TOTAL", "T3FREE", "THYROIDAB" in the last 72 hours.  Invalid input(s): "FREET3" Anemia work up No results for input(s): "VITAMINB12", "FOLATE", "FERRITIN", "TIBC", "IRON", "RETICCTPCT" in the last 72 hours. Urinalysis    Component Value Date/Time   COLORURINE YELLOW (A) 10/08/2023 1301   APPEARANCEUR CLEAR (A) 10/08/2023 1301   APPEARANCEUR Clear 05/24/2014 1903   LABSPEC >1.046 (H) 10/08/2023 1301   LABSPEC 1.015 05/24/2014 1903   PHURINE 6.0 10/08/2023 1301   GLUCOSEU NEGATIVE 10/08/2023 1301   GLUCOSEU Negative 05/24/2014 1903   HGBUR NEGATIVE 10/08/2023 1301   BILIRUBINUR NEGATIVE 10/08/2023 1301   BILIRUBINUR Negative 05/24/2014 1903   KETONESUR 20 (A) 10/08/2023 1301   PROTEINUR NEGATIVE 10/08/2023 1301   NITRITE NEGATIVE 10/08/2023 1301   LEUKOCYTESUR NEGATIVE 10/08/2023 1301   LEUKOCYTESUR Negative 05/24/2014 1903   Sepsis Labs Recent Labs  Lab 10/23/23 1144 10/25/23 0427  WBC 3.9* 5.4   Microbiology Recent Results (from the past 240 hours)  Blood culture (routine x 2)     Status: None (Preliminary result)   Collection Time: 10/23/23 11:44 AM   Specimen: BLOOD  Result Value Ref Range Status   Specimen Description BLOOD RIGHT ARM  Final   Special Requests   Final    BOTTLES DRAWN AEROBIC AND ANAEROBIC Blood Culture results may not be optimal due to an inadequate volume of blood received in culture bottles   Culture   Final    NO GROWTH 3 DAYS Performed at [facility], [address] [place], [place] [address]    Report Status PENDING  Incomplete  Blood culture (routine x 2)     Status: None (Preliminary result)   Collection Time: 10/23/23  5:39 PM   Specimen: BLOOD LEFT ARM  Result  Value Ref Range Status   Specimen Description BLOOD LEFT ARM  Final   Special Requests   Final    BOTTLES DRAWN AEROBIC AND ANAEROBIC Blood Culture adequate volume   Culture  Final    NO GROWTH 3 DAYS Performed at [place] [facility], [address] [place], [place] [address]    Report Status PENDING  Incomplete     Time coordinating discharge: Over 30 minutes  SIGNED:   Tresa Moore, MD  [facility] 10/26/2023, 12:40 PM Pager   If 7PM-7AM, please contact night-coverage

## 2023-10-28 LAB — CULTURE, BLOOD (ROUTINE X 2)
Culture: NO GROWTH
Culture: NO GROWTH
Special Requests: ADEQUATE

## 2023-10-29 ENCOUNTER — Other Ambulatory Visit (HOSPITAL_COMMUNITY): Payer: Self-pay

## 2023-10-30 ENCOUNTER — Ambulatory Visit: Admitting: Family Medicine

## 2023-10-31 ENCOUNTER — Other Ambulatory Visit (HOSPITAL_COMMUNITY): Payer: Self-pay

## 2023-11-02 ENCOUNTER — Emergency Department

## 2023-11-02 ENCOUNTER — Observation Stay
Admission: EM | Admit: 2023-11-02 | Discharge: 2023-11-05 | Disposition: A | Discharge status: Home / Self Care | Attending: Internal Medicine | Admitting: Internal Medicine

## 2023-11-02 ENCOUNTER — Other Ambulatory Visit: Payer: Self-pay

## 2023-11-02 DIAGNOSIS — T402X5A Adverse effect of other opioids, initial encounter: Secondary | ICD-10-CM | POA: Insufficient documentation

## 2023-11-02 DIAGNOSIS — F32A Depression, unspecified: Secondary | ICD-10-CM | POA: Diagnosis not present

## 2023-11-02 DIAGNOSIS — K861 Other chronic pancreatitis: Secondary | ICD-10-CM | POA: Insufficient documentation

## 2023-11-02 DIAGNOSIS — R1084 Generalized abdominal pain: Secondary | ICD-10-CM | POA: Diagnosis present

## 2023-11-02 DIAGNOSIS — Z79891 Long term (current) use of opiate analgesic: Secondary | ICD-10-CM

## 2023-11-02 DIAGNOSIS — F17210 Nicotine dependence, cigarettes, uncomplicated: Secondary | ICD-10-CM | POA: Insufficient documentation

## 2023-11-02 DIAGNOSIS — G40909 Epilepsy, unspecified, not intractable, without status epilepticus: Secondary | ICD-10-CM

## 2023-11-02 DIAGNOSIS — K863 Pseudocyst of pancreas: Secondary | ICD-10-CM | POA: Diagnosis present

## 2023-11-02 DIAGNOSIS — R109 Unspecified abdominal pain: Secondary | ICD-10-CM | POA: Diagnosis present

## 2023-11-02 DIAGNOSIS — M549 Dorsalgia, unspecified: Secondary | ICD-10-CM | POA: Insufficient documentation

## 2023-11-02 DIAGNOSIS — K859 Acute pancreatitis without necrosis or infection, unspecified: Secondary | ICD-10-CM

## 2023-11-02 DIAGNOSIS — Z91040 Latex allergy status: Secondary | ICD-10-CM | POA: Insufficient documentation

## 2023-11-02 DIAGNOSIS — G8929 Other chronic pain: Secondary | ICD-10-CM | POA: Diagnosis present

## 2023-11-02 DIAGNOSIS — J9601 Acute respiratory failure with hypoxia: Secondary | ICD-10-CM | POA: Diagnosis present

## 2023-11-02 DIAGNOSIS — K760 Fatty (change of) liver, not elsewhere classified: Secondary | ICD-10-CM | POA: Insufficient documentation

## 2023-11-02 DIAGNOSIS — E876 Hypokalemia: Secondary | ICD-10-CM | POA: Diagnosis not present

## 2023-11-02 DIAGNOSIS — R531 Weakness: Secondary | ICD-10-CM | POA: Insufficient documentation

## 2023-11-02 DIAGNOSIS — J9621 Acute and chronic respiratory failure with hypoxia: Secondary | ICD-10-CM | POA: Insufficient documentation

## 2023-11-02 DIAGNOSIS — J188 Other pneumonia, unspecified organism: Secondary | ICD-10-CM | POA: Diagnosis not present

## 2023-11-02 DIAGNOSIS — R2689 Other abnormalities of gait and mobility: Secondary | ICD-10-CM | POA: Diagnosis not present

## 2023-11-02 DIAGNOSIS — G894 Chronic pain syndrome: Secondary | ICD-10-CM | POA: Insufficient documentation

## 2023-11-02 DIAGNOSIS — J690 Pneumonitis due to inhalation of food and vomit: Secondary | ICD-10-CM | POA: Diagnosis present

## 2023-11-02 DIAGNOSIS — E039 Hypothyroidism, unspecified: Secondary | ICD-10-CM | POA: Diagnosis not present

## 2023-11-02 DIAGNOSIS — Z79899 Other long term (current) drug therapy: Secondary | ICD-10-CM | POA: Insufficient documentation

## 2023-11-02 DIAGNOSIS — E8809 Other disorders of plasma-protein metabolism, not elsewhere classified: Secondary | ICD-10-CM | POA: Diagnosis not present

## 2023-11-02 DIAGNOSIS — R262 Difficulty in walking, not elsewhere classified: Secondary | ICD-10-CM

## 2023-11-02 DIAGNOSIS — K5903 Drug induced constipation: Secondary | ICD-10-CM | POA: Diagnosis not present

## 2023-11-02 DIAGNOSIS — J189 Pneumonia, unspecified organism: Secondary | ICD-10-CM

## 2023-11-02 LAB — CBC WITH DIFFERENTIAL/PLATELET
Abs Immature Granulocytes: 0.04 10*3/uL (ref 0.00–0.07)
Basophils Absolute: 0 10*3/uL (ref 0.0–0.1)
Basophils Relative: 0 %
Eosinophils Absolute: 0 10*3/uL (ref 0.0–0.5)
Eosinophils Relative: 0 %
HCT: 35.1 % — ABNORMAL LOW (ref 36.0–46.0)
Hemoglobin: 11.2 g/dL — ABNORMAL LOW (ref 12.0–15.0)
Immature Granulocytes: 0 %
Lymphocytes Relative: 11 %
Lymphs Abs: 1 10*3/uL (ref 0.7–4.0)
MCH: 30.1 pg (ref 26.0–34.0)
MCHC: 31.9 g/dL (ref 30.0–36.0)
MCV: 94.4 fL (ref 80.0–100.0)
Monocytes Absolute: 0.6 10*3/uL (ref 0.1–1.0)
Monocytes Relative: 7 %
Neutro Abs: 7.6 10*3/uL (ref 1.7–7.7)
Neutrophils Relative %: 82 %
Platelets: 288 10*3/uL (ref 150–400)
RBC: 3.72 MIL/uL — ABNORMAL LOW (ref 3.87–5.11)
RDW: 15.9 % — ABNORMAL HIGH (ref 11.5–15.5)
WBC: 9.4 10*3/uL (ref 4.0–10.5)
nRBC: 0 % (ref 0.0–0.2)

## 2023-11-02 LAB — COMPREHENSIVE METABOLIC PANEL
ALT: 15 U/L (ref 0–44)
AST: 22 U/L (ref 15–41)
Albumin: 1.9 g/dL — ABNORMAL LOW (ref 3.5–5.0)
Alkaline Phosphatase: 107 U/L (ref 38–126)
Anion gap: 9 (ref 5–15)
BUN: <5 mg/dL — ABNORMAL LOW (ref 6–20)
CO2: 29 mmol/L (ref 22–32)
Calcium: 7.4 mg/dL — ABNORMAL LOW (ref 8.9–10.3)
Chloride: 98 mmol/L (ref 98–111)
Creatinine, Ser: 0.5 mg/dL (ref 0.44–1.00)
GFR, Estimated: >60 mL/min (ref >60)
Glucose, Bld: 105 mg/dL — ABNORMAL HIGH (ref 70–99)
Potassium: 3 mmol/L — ABNORMAL LOW (ref 3.5–5.1)
Sodium: 136 mmol/L (ref 135–145)
Total Bilirubin: 1.1 mg/dL (ref 0.0–1.2)
Total Protein: 6.3 g/dL — ABNORMAL LOW (ref 6.5–8.1)

## 2023-11-02 LAB — LIPASE, BLOOD: Lipase: 25 U/L (ref 11–51)

## 2023-11-02 LAB — MAGNESIUM: Magnesium: 1.7 mg/dL (ref 1.7–2.4)

## 2023-11-02 LAB — LACTIC ACID, PLASMA: Lactic Acid, Venous: 1.4 mmol/L (ref 0.5–1.9)

## 2023-11-02 MED ORDER — ACETAMINOPHEN 325 MG PO TABS
650.0000 mg | ORAL_TABLET | Freq: Four times a day (QID) | ORAL | Status: DC | PRN
  Administered 2023-11-03 – 2023-11-04 (×2): 650 mg via ORAL
  Filled 2023-11-02 (×2): qty 2

## 2023-11-02 MED ORDER — ONDANSETRON HCL 4 MG PO TABS: 4.0000 mg | ORAL_TABLET | Freq: Four times a day (QID) | ORAL | Status: DC | PRN

## 2023-11-02 MED ORDER — LAMOTRIGINE 100 MG PO TABS: 50.0000 mg | ORAL_TABLET | Freq: Two times a day (BID) | ORAL | Status: DC

## 2023-11-02 MED ORDER — SODIUM CHLORIDE 0.9 % IV SOLN
500.0000 mg | Freq: Once | INTRAVENOUS | Status: AC
  Administered 2023-11-02: 500 mg via INTRAVENOUS
  Filled 2023-11-02: qty 5

## 2023-11-02 MED ORDER — DROPERIDOL 2.5 MG/ML IJ SOLN
2.5000 mg | Freq: Once | INTRAMUSCULAR | Status: AC
  Administered 2023-11-02: 2.5 mg via INTRAVENOUS
  Filled 2023-11-02: qty 2

## 2023-11-02 MED ORDER — LEVETIRACETAM 500 MG PO TABS
500.0000 mg | ORAL_TABLET | Freq: Two times a day (BID) | ORAL | Status: DC
  Administered 2023-11-02 – 2023-11-05 (×7): 500 mg via ORAL
  Filled 2023-11-02 (×7): qty 1

## 2023-11-02 MED ORDER — IOHEXOL 300 MG/ML  SOLN
100.0000 mL | Freq: Once | INTRAMUSCULAR | Status: AC | PRN
  Administered 2023-11-02: 100 mL via INTRAVENOUS

## 2023-11-02 MED ORDER — SODIUM CHLORIDE 0.9 % IV SOLN
2.0000 g | INTRAVENOUS | Status: DC
  Administered 2023-11-03 – 2023-11-04 (×2): 2 g via INTRAVENOUS
  Filled 2023-11-02 (×2): qty 20

## 2023-11-02 MED ORDER — ENOXAPARIN SODIUM 40 MG/0.4ML IJ SOSY
40.0000 mg | PREFILLED_SYRINGE | INTRAMUSCULAR | Status: DC
Start: 2023-11-02 — End: 2023-11-05
  Administered 2023-11-02 – 2023-11-05 (×4): 40 mg via SUBCUTANEOUS
  Filled 2023-11-02 (×4): qty 0.4

## 2023-11-02 MED ORDER — PANTOPRAZOLE SODIUM 40 MG PO TBEC
40.0000 mg | DELAYED_RELEASE_TABLET | Freq: Every day | ORAL | Status: DC
Start: 2023-11-02 — End: 2023-11-05
  Administered 2023-11-02 – 2023-11-05 (×4): 40 mg via ORAL
  Filled 2023-11-02 (×4): qty 1

## 2023-11-02 MED ORDER — SODIUM CHLORIDE 0.9 % IV BOLUS
1000.0000 mL | Freq: Once | INTRAVENOUS | Status: AC
  Administered 2023-11-02: 1000 mL via INTRAVENOUS

## 2023-11-02 MED ORDER — SENNOSIDES-DOCUSATE SODIUM 8.6-50 MG PO TABS
1.0000 | ORAL_TABLET | Freq: Two times a day (BID) | ORAL | Status: DC
  Administered 2023-11-02 – 2023-11-05 (×6): 1 via ORAL
  Filled 2023-11-02 (×6): qty 1

## 2023-11-02 MED ORDER — CARBAMAZEPINE ER 100 MG PO TB12
100.0000 mg | ORAL_TABLET | Freq: Two times a day (BID) | ORAL | Status: DC
  Administered 2023-11-02 – 2023-11-05 (×7): 100 mg via ORAL
  Filled 2023-11-02 (×7): qty 1

## 2023-11-02 MED ORDER — SODIUM CHLORIDE 0.9 % IV SOLN
500.0000 mg | INTRAVENOUS | Status: DC
  Administered 2023-11-03 – 2023-11-04 (×2): 500 mg via INTRAVENOUS
  Filled 2023-11-02 (×2): qty 5

## 2023-11-02 MED ORDER — SODIUM CHLORIDE 0.9 % IV SOLN
1.0000 g | Freq: Once | INTRAVENOUS | Status: AC
  Administered 2023-11-02: 1 g via INTRAVENOUS
  Filled 2023-11-02: qty 10

## 2023-11-02 MED ORDER — NICOTINE 14 MG/24HR TD PT24
14.0000 mg | MEDICATED_PATCH | Freq: Every day | TRANSDERMAL | Status: DC
  Filled 2023-11-02 (×2): qty 1

## 2023-11-02 MED ORDER — GUAIFENESIN ER 600 MG PO TB12
600.0000 mg | ORAL_TABLET | Freq: Two times a day (BID) | ORAL | Status: DC
  Administered 2023-11-02 – 2023-11-05 (×6): 600 mg via ORAL
  Filled 2023-11-02 (×7): qty 1

## 2023-11-02 MED ORDER — ACETAMINOPHEN 650 MG RE SUPP
650.0000 mg | Freq: Four times a day (QID) | RECTAL | Status: DC | PRN
Start: 2023-11-02 — End: 2023-11-05

## 2023-11-02 MED ORDER — ALBUTEROL SULFATE (2.5 MG/3ML) 0.083% IN NEBU: 2.5000 mg | INHALATION_SOLUTION | RESPIRATORY_TRACT | Status: DC | PRN

## 2023-11-02 MED ORDER — ONDANSETRON HCL 4 MG/2ML IJ SOLN: 4.0000 mg | Freq: Four times a day (QID) | INTRAMUSCULAR | Status: DC | PRN

## 2023-11-02 MED ORDER — BISACODYL 5 MG PO TBEC: 5.0000 mg | DELAYED_RELEASE_TABLET | Freq: Every day | ORAL | Status: DC | PRN

## 2023-11-02 MED ORDER — GABAPENTIN 300 MG PO CAPS
300.0000 mg | ORAL_CAPSULE | Freq: Four times a day (QID) | ORAL | Status: DC
Start: 2023-11-02 — End: 2023-11-05
  Administered 2023-11-02 – 2023-11-05 (×13): 300 mg via ORAL
  Filled 2023-11-02 (×13): qty 1

## 2023-11-02 MED ORDER — ONDANSETRON HCL 4 MG/2ML IJ SOLN
4.0000 mg | Freq: Once | INTRAMUSCULAR | Status: AC
  Administered 2023-11-02: 4 mg via INTRAVENOUS
  Filled 2023-11-02: qty 2

## 2023-11-02 MED ORDER — MORPHINE SULFATE (PF) 2 MG/ML IV SOLN
2.0000 mg | Freq: Once | INTRAVENOUS | Status: AC
  Administered 2023-11-02: 2 mg via INTRAVENOUS
  Filled 2023-11-02: qty 1

## 2023-11-02 MED ORDER — POLYETHYLENE GLYCOL 3350 17 G PO PACK
17.0000 g | PACK | Freq: Every day | ORAL | Status: DC
  Administered 2023-11-05: 17 g via ORAL
  Filled 2023-11-02 (×3): qty 1

## 2023-11-02 MED ORDER — MODAFINIL 200 MG PO TABS: 200.0000 mg | ORAL_TABLET | Freq: Every day | ORAL | Status: DC

## 2023-11-02 MED ORDER — MIRTAZAPINE 15 MG PO TABS
30.0000 mg | ORAL_TABLET | Freq: Every day | ORAL | Status: DC
  Administered 2023-11-02 – 2023-11-04 (×3): 30 mg via ORAL
  Filled 2023-11-02 (×3): qty 2

## 2023-11-02 MED ORDER — MORPHINE SULFATE 15 MG PO TABS
15.0000 mg | ORAL_TABLET | Freq: Three times a day (TID) | ORAL | Status: DC | PRN
  Administered 2023-11-02 – 2023-11-05 (×9): 15 mg via ORAL
  Filled 2023-11-02 (×9): qty 1

## 2023-11-02 MED ORDER — DULOXETINE HCL 30 MG PO CPEP
90.0000 mg | ORAL_CAPSULE | Freq: Every day | ORAL | Status: DC
  Administered 2023-11-02 – 2023-11-05 (×4): 90 mg via ORAL
  Filled 2023-11-02 (×3): qty 3
  Filled 2023-11-02: qty 1

## 2023-11-02 MED ORDER — POTASSIUM CHLORIDE CRYS ER 20 MEQ PO TBCR
40.0000 meq | EXTENDED_RELEASE_TABLET | Freq: Once | ORAL | Status: AC
  Administered 2023-11-02: 40 meq via ORAL
  Filled 2023-11-02: qty 2

## 2023-11-02 MED ORDER — LAMOTRIGINE 100 MG PO TABS: 100.0000 mg | ORAL_TABLET | Freq: Two times a day (BID) | ORAL | Status: DC

## 2023-11-02 MED ORDER — LAMOTRIGINE 100 MG PO TABS
50.0000 mg | ORAL_TABLET | Freq: Every day | ORAL | Status: DC
  Administered 2023-11-02 – 2023-11-05 (×4): 50 mg via ORAL
  Filled 2023-11-02: qty 2
  Filled 2023-11-02 (×3): qty 1

## 2023-11-02 NOTE — Assessment & Plan Note (Signed)
 Potassium 3 Replete and monitor

## 2023-11-02 NOTE — ED Notes (Addendum)
 Report received from [place], [place].

## 2023-11-02 NOTE — Assessment & Plan Note (Signed)
 Lipase and LFTs normal

## 2023-11-02 NOTE — ED Triage Notes (Signed)
 BIBA from home for complaints of abd pain since 10 pm tonight w/ nausea. Pain is located in RLQ. Pain currently 9/10. O2 84% on EMS arrival, placed on 2L [place].

## 2023-11-02 NOTE — ED Notes (Signed)
 Pt states incontinent of urine/stool, bed and clothes dry. Pt bed changed and placed in gown. Given new blankets/pillows for comfort.

## 2023-11-02 NOTE — ED Notes (Signed)
 Pt repositioned for comfort

## 2023-11-02 NOTE — Hospital Course (Signed)
 Abigail [name]

## 2023-11-02 NOTE — ED Notes (Signed)
 Transferred patient to hospital bed per request. Repositioned patient in bed. Patient stated "This feels better and more comfortable".

## 2023-11-02 NOTE — ED Provider Notes (Addendum)
 [facility] Provider Note    Event Date/Time   First MD Initiated Contact with Patient 11/02/23 0045     (approximate)   History   Abdominal Pain   HPI  Abigail [name] is a 58 y.o. female   Past medical history of chronic pain, fibromyalgia, chronic narcotic use, IBS, recurrent pancreatitis, who presents to the Emergency Department with right-sided abdominal pain onset yesterday along with loose stools.  No GI bleeding.  No urinary symptoms.  No fever or chills.  + nausea.  Was hospitalized for diarrheal illness and hypokalemia recently and since her discharge over 1 week ago diarrheal illness had mostly resolved, then recurred about 1 day ago along with right sided abdominal pain.  EMS reports that she was hypoxemic to the high 80s and put on 2 L with good result.  However when I am in the room speaking with her for interview to take her off oxygen she is saturating in the 99 to 100% range on room air.  She denies any shortness of breath or chest pain.  She has had a cough.  External Medical Documents Reviewed: Discharge summary from 10/26/2023 for diarrheal illness, with hypokalemia, at that time noted to be on 2 L continuously for chronic hypoxemic respiratory failure.      Physical Exam   Triage Vital Signs: ED Triage Vitals  Encounter Vitals Group     BP      Systolic BP Percentile      Diastolic BP Percentile      Pulse      Resp      Temp      Temp src      SpO2      Weight      Height      Head Circumference      Peak Flow      Pain Score      Pain Loc      Pain Education      Exclude from Growth Chart     Most recent vital signs: Vitals:   11/02/23 0049 11/02/23 0300  BP: 130/77 107/60  Pulse: (!) 102 97  Resp: 18 17  Temp: 98.7 F (37.1 C)   SpO2: 98% 100%    General: Awake, no distress.  CV:  Good peripheral perfusion.  Resp:  Normal effort.  Abd:  No distention.  Other:  Awake comfortable appearing but has  right sided upper and lower quadrant tenderness to palpation as well as epigastric tenderness palpation without rigidity or guarding.  Mildly tachycardic in the low 100s.  Oxygenation is 98 to 100% on room air.  No respiratory distress and clear lungs.   ED Results / Procedures / Treatments   Labs (all labs ordered are listed, but only abnormal results are displayed) Labs Reviewed  COMPREHENSIVE METABOLIC PANEL - Abnormal; Notable for the following components:      Result Value   Potassium 3.0 (*)    Glucose, Bld 105 (*)    BUN <5 (*)    Calcium 7.4 (*)    Total Protein 6.3 (*)    Albumin 1.9 (*)    All other components within normal limits  CBC WITH DIFFERENTIAL/PLATELET - Abnormal; Notable for the following components:   RBC 3.72 (*)    Hemoglobin 11.2 (*)    HCT 35.1 (*)    RDW 15.9 (*)    All other components within normal limits  LACTIC ACID, PLASMA  LIPASE, BLOOD  MAGNESIUM     I ordered and reviewed the above labs they are notable for mild hypokalemia  EKG  ED ECG REPORT I, Pilar Jarvis, the attending physician, personally viewed and interpreted this ECG.   Date: 11/02/2023  EKG Time: 0053  Rate: 100  Rhythm: sinus tachycardia  Axis: RAD  Intervals:nl  ST&T Change: no stemi    RADIOLOGY I independently reviewed and interpreted CT of the abdomen pelvis to see multiple cystic structures in the upper abdominal region consistent with her history of pancreatic pseudocysts  I also reviewed radiologist's formal read.   PROCEDURES:  Critical Care performed: Yes, see critical care procedure note(s)  .Critical Care  Performed by: Pilar Jarvis, MD Authorized by: Pilar Jarvis, MD   Critical care provider statement:    Critical care time (minutes):  30   Critical care was time spent personally by me on the following activities:  Development of treatment plan with patient or surrogate, discussions with consultants, evaluation of patient's response to treatment,  examination of patient, ordering and review of laboratory studies, ordering and review of radiographic studies, ordering and performing treatments and interventions, pulse oximetry, re-evaluation of patient's condition and review of old charts    MEDICATIONS ORDERED IN ED: Medications  cefTRIAXone (ROCEPHIN) 1 g in sodium chloride 0.9 % 100 mL IVPB (has no administration in time range)  azithromycin (ZITHROMAX) 500 mg in sodium chloride 0.9 % 250 mL IVPB (has no administration in time range)  morphine (PF) 2 MG/ML injection 2 mg (2 mg Intravenous Given 11/02/23 0140)  ondansetron (ZOFRAN) injection 4 mg (4 mg Intravenous Given 11/02/23 0140)  sodium chloride 0.9 % bolus 1,000 mL (0 mLs Intravenous Stopped 11/02/23 0339)  potassium chloride SA (KLOR-CON M) CR tablet 40 mEq (40 mEq Oral Given 11/02/23 0152)  iohexol (OMNIPAQUE) 300 MG/ML solution 100 mL (100 mLs Intravenous Contrast Given 11/02/23 0244)  morphine (PF) 2 MG/ML injection 2 mg (2 mg Intravenous Given 11/02/23 0307)  droperidol (INAPSINE) 2.5 MG/ML injection 2.5 mg (2.5 mg Intravenous Given 11/02/23 0341)     IMPRESSION / MDM / ASSESSMENT AND PLAN / ED COURSE  I reviewed the triage vital signs and the nursing notes.                                Patient's presentation is most consistent with acute presentation with potential threat to life or bodily function.  Differential diagnosis includes, but is not limited to, pancreatitis, cholecystitis, biliary colic, gallstones, IBS flare, colitis, urinary tract infection, considered but less likely mesenteric ischemia   The patient is on the cardiac monitor to evaluate for evidence of arrhythmia and/or significant heart rate changes.  MDM:    Right-sided abdominal pain along with loose stools similar to her episode leading to hospitalization a couple weeks ago.    Check labs, electrolytes given her electrolyte derangements last time.    Check CT scan of the abdomen pelvis given her  history of pseudocyst and recurrent pancreatitis, right-sided abdominal tenderness.    Give fluids as she looks mildly dehydrated with dry mucous membranes and mild tachycardia.    Doubt mesenteric ischemia given no pain out of proportion and normal lactic.  -- CT scan with acute on chronic pancreatitis noted, many pre-existing pseudocyst of decreased in size, some have increased.  She continues to have epigastric discomfort.  There is evidence of multifocal pneumonia on CT of the abdomen pelvis as well.  I added on a chest x-ray to better characterize the upper lung fields.  She did have desaturation to 80% while sleeping/after getting a small dose of morphine however in the setting of cough and aspiration/multifocal pneumonia I am concerned that her desaturation may be due to infection as well.  She is put on 2 L with improvement now 95% resting comfortably breathing comfortably.  I started her on ceftriaxone and azithromycin for commune acquired pneumonia coverage.  Admission.       FINAL CLINICAL IMPRESSION(S) / ED DIAGNOSES   Final diagnoses:  Generalized abdominal pain  Hypokalemia  Acute on chronic pancreatitis [facility])  Pancreatic pseudocyst  Multifocal pneumonia     Rx / DC Orders   ED Discharge Orders     None        Note:  This document was prepared using Dragon voice recognition software and may include unintentional dictation errors.    Pilar Jarvis, MD 11/02/23 4098    Pilar Jarvis, MD 11/02/23 1191    Pilar Jarvis, MD 11/02/23 [phone]

## 2023-11-02 NOTE — ED Notes (Signed)
 RN walked in to find patient pulled Oxygen off. RN re-applied Abigail [name] o2.

## 2023-11-02 NOTE — Assessment & Plan Note (Addendum)
 Mild colitis on CT Patient is on multiple laxatives-states she had 2 bowel movements on the day of arrival

## 2023-11-02 NOTE — Assessment & Plan Note (Signed)
 Continue carbamazepine lamotrigine and gabapentin Keppra

## 2023-11-02 NOTE — H&P (Signed)
 History and Physical    Patient: Abigail [name] DOB: 1966-07-07 DOA: 11/02/2023 DOS: the patient was seen and examined on 11/02/2023 PCP: Alease Medina, MD  Patient coming from: Home  Chief Complaint:  Chief Complaint  Patient presents with   Abdominal Pain    HPI: Abigail [name] is a 58 y.o. female with medical history significant for Chronic pancreatitis and pancreatic pseudocyst, chronic pain on opiates with opioid-induced constipation on multiple laxatives, seizure disorder, COPD on prn O2, history of giant cell arteritis no longer on steroids, depression, hypothyroidism, being admitted with multifocal pneumonia, abdominal pain and hypoxia. Patient presented to the ED with a cough and a several day history of upper and right-sided abdominal pain.  She was treated with morphine with improvement in pain but subsequently became hypoxic to 80% requiring 2 L O2 to maintain sats in the mid 90s.  She had a CT of the abdomen and pelvis with suggested multifocal pneumonia lung bases.  Chest x-ray still pending. She was started on Rocephin and azithromycin on admission requested. Other workup: Vitals notable for hypoxia as above with mild tachycardia to 102, afebrile BP 130/77 Labs with normal lipase and LFTs, normal WBC, hemoglobin 11.2 and potassium 3.0 EKG, personally viewed and interpreted showing sinus tachycardia at 100 with borderline T wave abnormalities CT abdomen and pelvis with possible aspiration or pneumonia in lower lungs, multiple pancreatic pseudocysts and mild colitis.   Review of Systems: As mentioned in the history of present illness. All other systems reviewed and are negative.  Past Medical History:  Diagnosis Date   Anxiety    Arthritis    Back pain    Basal cell carcinoma 10/04/2021   R axilla - needs excised 11/28/21   Basal cell carcinoma 10/04/2021   L antecubital excised 11/14/21   Diarrhea 11/12/2016   Fibromyalgia     Generalized abdominal pain 11/12/2016   H. pylori infection    Hyperlipidemia    IBS (irritable bowel syndrome)    Infectious colitis 04/29/2016   Migraines    Moderate dehydration 04/29/2016   Muscle pain    Opioid overdose (HCC) 12/07/2022   Reflux    Seizures (HCC)    Unexplained weight loss 11/12/2016   Past Surgical History:  Procedure Laterality Date   ABDOMINAL HYSTERECTOMY     APPENDECTOMY  2009   C5 FUSION     C6 FUSION     C7 FUSION     COLONOSCOPY  02/2006   COLONOSCOPY WITH PROPOFOL N/A 12/25/2016   Procedure: COLONOSCOPY WITH PROPOFOL;  Surgeon: Earline Mayotte, MD;  Location: [facility] ENDOSCOPY;  Service: Endoscopy;  Laterality: N/A;   ESOPHAGOGASTRODUODENOSCOPY (EGD) WITH PROPOFOL N/A 12/25/2016   Procedure: ESOPHAGOGASTRODUODENOSCOPY (EGD) WITH PROPOFOL;  Surgeon: Earline Mayotte, MD;  Location: [facility] ENDOSCOPY;  Service: Endoscopy;  Laterality: N/A;   FOOT SURGERY     HIP ARTHROPLASTY     L4 FUSION     L5 FUSION     S1 FUSION     Social History:  reports that she has been smoking cigarettes. She has never used smokeless tobacco. She reports that she does not currently use alcohol. She reports that she does not currently use drugs.  Allergies  Allergen Reactions   Nsaids Hives   Tapentadol Swelling, Rash and Other (See Comments)    Nucynta- Made her deathly sick   Cephalexin Other (See Comments)    Reaction not cited   Codeine Other (See Comments)  Reaction not cited   Darvocet [Propoxyphene N-Acetaminophen] Other (See Comments)    Reaction not cited   Latex Other (See Comments)    Reaction not cited   Silicone Other (See Comments)    Reaction not cited   Sulfa Antibiotics Hives   Tape Other (See Comments)    Reaction not cited   Meloxicam Rash    Family History  Problem Relation Age of Onset   Diabetes Father    Hypertension Father     Prior to Admission medications   Medication Sig Start Date End Date Taking? Authorizing Provider   bisacodyl (DULCOLAX) 5 MG EC tablet Take 1 tablet (5 mg total) by mouth daily as needed for moderate constipation. 10/04/23   Delfino Lovett, MD  carbamazepine (TEGRETOL XR) 100 MG 12 hr tablet Take 1 tablet (100 mg total) by mouth 2 (two) times daily. 10/16/23 11/15/23  Arnetha Courser, MD  DULoxetine (CYMBALTA) 30 MG capsule Take 3 capsules (90 mg total) by mouth daily. 10/17/23   Arnetha Courser, MD  feeding supplement (ENSURE ENLIVE / ENSURE PLUS) LIQD Take 237 mLs by mouth 2 (two) times daily between meals. 10/16/23   Arnetha Courser, MD  gabapentin (NEURONTIN) 300 MG capsule Take 1 capsule (300 mg total) by mouth 4 (four) times daily. 10/04/23 11/03/23  Delfino Lovett, MD  lamoTRIgine (LAMICTAL) 25 MG tablet Take 2 tablets (50 mg total) by mouth daily for 12 days, THEN 2 tablets (50 mg total) 2 (two) times daily for 14 days, THEN 4 tablets (100 mg total) 2 (two) times daily. 10/27/23 12/22/23  Tresa Moore, MD  levETIRAcetam (KEPPRA) 500 MG tablet Take 1 tablet (500 mg total) by mouth 2 (two) times daily for 3 doses. 10/26/23 10/28/23  Lolita Patella B, MD  lidocaine 4 % Place 2 patches onto the skin daily. Remove & Discard patch within 12 hours or as directed by MD 10/26/23   Tresa Moore, MD  Melatonin 10 MG TABS Take 10 mg by mouth at bedtime.    [provider]  mirtazapine (REMERON) 30 MG tablet Take 1 tablet (30 mg total) by mouth at bedtime. 10/16/23   Arnetha Courser, MD  modafinil (PROVIGIL) 200 MG tablet Take 200 mg by mouth daily. 08/07/23   [provider]  morphine (MSIR) 15 MG tablet Take 1 tablet (15 mg total) by mouth every 8 (eight) hours as needed for up to 7 days for severe pain (pain score 7-10). 10/26/23 11/02/23  Tresa Moore, MD  nicotine (NICODERM CQ - DOSED IN MG/24 HOURS) 14 mg/24hr patch One patch chest wall daily (okay to substitute generic) 07/15/23   Alford Highland, MD  NON FORMULARY Apply 1 Application topically 3 (three) times daily as needed. Rock Salt  Cream (Similar to [employer])    [provider]  ondansetron (ZOFRAN-ODT) 8 MG disintegrating tablet Take 1 tablet (8 mg total) by mouth every 8 (eight) hours as needed for nausea. 09/23/23   [name], [name], MD  pantoprazole (PROTONIX) 40 MG tablet Take 40 mg by mouth daily. 10/02/23   [provider]  polyethylene glycol powder (GLYCOLAX/MIRALAX) 17 GM/SCOOP powder Mix 17 g with a beverage and drink once daily. 10/05/23   Delfino Lovett, MD  senna-docusate (SENOKOT-S) 8.6-50 MG tablet Take 1 tablet by mouth 2 (two) times daily. 10/04/23 11/03/23  Delfino Lovett, MD    Physical Exam: Vitals:   11/02/23 0049 11/02/23 0050 11/02/23 0300 11/02/23 0452  BP: 130/77  107/60   Pulse: Marland Kitchen)  102  97   Resp: 18  17   Temp: 98.7 F (37.1 C)   98.1 F (36.7 C)  TempSrc: Oral   Oral  SpO2: 98%  100%   Weight:  59 kg    Height:  5\' 8"  (1.727 m)     Physical Exam Vitals and nursing note reviewed.  Constitutional:      General: She is not in acute distress.    Comments: Chronically ill-appearing  HENT:     Head: Normocephalic and atraumatic.  Cardiovascular:     Rate and Rhythm: Normal rate and regular rhythm.     Heart sounds: Normal heart sounds.  Pulmonary:     Effort: Pulmonary effort is normal.     Breath sounds: Normal breath sounds.  Abdominal:     Palpations: Abdomen is soft.     Tenderness: There is no abdominal tenderness.     Comments: Right flank pain  Neurological:     Mental Status: Mental status is at baseline.     Labs on Admission: I have personally reviewed following labs and imaging studies  CBC: Recent Labs  Lab 11/02/23 0111  WBC 9.4  NEUTROABS 7.6  HGB 11.2*  HCT 35.1*  MCV 94.4  PLT 288   Basic Metabolic Panel: Recent Labs  Lab 11/02/23 0111  NA 136  K 3.0*  CL 98  CO2 29  GLUCOSE 105*  BUN <5*  CREATININE 0.50  CALCIUM 7.4*  MG 1.7   GFR: Estimated Creatinine Clearance: 71.4 mL/min (by C-G formula based on SCr of 0.5 mg/dL). Liver  Function Tests: Recent Labs  Lab 11/02/23 0111  AST 22  ALT 15  ALKPHOS 107  BILITOT 1.1  PROT 6.3*  ALBUMIN 1.9*   Recent Labs  Lab 11/02/23 0111  LIPASE 25   No results for input(s): "AMMONIA" in the last 168 hours. Coagulation Profile: No results for input(s): "INR", "PROTIME" in the last 168 hours. Cardiac Enzymes: No results for input(s): "CKTOTAL", "CKMB", "CKMBINDEX", "TROPONINI" in the last 168 hours. BNP (last 3 results) No results for input(s): "PROBNP" in the last 8760 hours. HbA1C: No results for input(s): "HGBA1C" in the last 72 hours. CBG: No results for input(s): "GLUCAP" in the last 168 hours. Lipid Profile: No results for input(s): "CHOL", "HDL", "LDLCALC", "TRIG", "CHOLHDL", "LDLDIRECT" in the last 72 hours. Thyroid Function Tests: No results for input(s): "TSH", "T4TOTAL", "FREET4", "T3FREE", "THYROIDAB" in the last 72 hours. Anemia Panel: No results for input(s): "VITAMINB12", "FOLATE", "FERRITIN", "TIBC", "IRON", "RETICCTPCT" in the last 72 hours. Urine analysis:    Component Value Date/Time   COLORURINE YELLOW (A) 10/08/2023 1301   APPEARANCEUR CLEAR (A) 10/08/2023 1301   APPEARANCEUR Clear 05/24/2014 1903   LABSPEC >1.046 (H) 10/08/2023 1301   LABSPEC 1.015 05/24/2014 1903   PHURINE 6.0 10/08/2023 1301   GLUCOSEU NEGATIVE 10/08/2023 1301   GLUCOSEU Negative 05/24/2014 1903   HGBUR NEGATIVE 10/08/2023 1301   BILIRUBINUR NEGATIVE 10/08/2023 1301   BILIRUBINUR Negative 05/24/2014 1903   KETONESUR 20 (A) 10/08/2023 1301   PROTEINUR NEGATIVE 10/08/2023 1301   NITRITE NEGATIVE 10/08/2023 1301   LEUKOCYTESUR NEGATIVE 10/08/2023 1301   LEUKOCYTESUR Negative 05/24/2014 1903    Radiological Exams on Admission: CT ABDOMEN PELVIS W CONTRAST Result Date: 11/02/2023 CLINICAL DATA:  Right lower quadrant abdominal pain with nausea EXAM: CT ABDOMEN AND PELVIS WITH CONTRAST TECHNIQUE: Multidetector CT imaging of the abdomen and pelvis was performed using  the standard protocol following bolus administration of intravenous contrast. RADIATION DOSE  REDUCTION: This exam was performed according to the departmental dose-optimization program which includes automated exposure control, adjustment of the mA and/or kV according to patient size and/or use of iterative reconstruction technique. CONTRAST:  OMNIPAQUE IOHEXOL 300 MG/ML  SOLN COMPARISON:  CT abdomen pelvis 09/24/2023 FINDINGS: Lower chest: Patchy airspace opacities in the right middle lobe and bilateral lower lobes compatible with multifocal pneumonia. Hepatobiliary: Marked hepatic steatosis. Increased size of the subcapsular fluid collection along the anterior left hepatic lobe now measuring 5.1 x 2.7 cm. Adjacent hyperemic liver parenchyma. Pericholecystic fluid about the gallbladder fundus. No biliary dilation. Pancreas: Similar pancreatic atrophy. Large loculated thick-walled cystic structure in the pancreatic head has decreased in size now measuring 4.3 cm, previously 7.6 cm. This likely represents a chronic pseudocyst. New bilobed pseudocyst in the pancreatic body measuring 2.6 cm. Decreased size of the pseudocyst if the body/tail junction now measuring 1.9 cm, previously 2.8 cm. Decreased size of the pseudocyst in the pancreatic tail now measuring 5.4 cm, previously 7.8 cm. This fluid collection extends between the diaphragm and superior spleen. The previous collection in the gastrohepatic ligament has nearly resolved now measuring 2.4 cm, previously 7.6 cm. Similar stranding and fluid about the pseudocyst in the pancreatic head. Spleen: Unremarkable. Adrenals/Urinary Tract: Stable adrenal glands. No urinary calculi or hydronephrosis. Unremarkable bladder. Stomach/Bowel: Stomach is within normal limits. Reactive wall thickening about the duodenum. No bowel obstruction. Scattered areas of wall thickening throughout the colon similar to prior. Vascular/Lymphatic: Aortic atherosclerosis. No enlarged  abdominal or pelvic lymph nodes. Patent portal vein. Reproductive: Unremarkable. Other: No free intraperitoneal air. Musculoskeletal: Right THA. Anterior fusion L4-S1. Chronic compression fractures of multiple thoracic and lumbar vertebral bodies. IMPRESSION: 1. Aspiration or pneumonia in the lower lungs. 2. Acute on chronic pancreatitis with multiple pseudocysts. Some of the pseudocyst have increased in size while others have decreased. 3. Similar mild colitis. 4. Marked hepatic steatosis. 5. Aortic Atherosclerosis (ICD10-I70.0). Electronically Signed   By: Minerva Fester M.D.   On: 11/02/2023 03:29     Data Reviewed: Relevant notes from primary care and specialist visits, past discharge summaries as available in EHR, including Care Everywhere. Prior diagnostic testing as pertinent to current admission diagnoses Updated medications and problem lists for reconciliation ED course, including vitals, labs, imaging, treatment and response to treatment Triage notes, nursing and pharmacy notes and ED provider's notes Notable results as noted in HPI   Assessment and Plan: * Aspiration pneumonia (HCC) Acute respiratory failure with hypoxia CT abdomen and pelvis consistent with aspiration pneumonia Was hypoxic to 80 degrees following administration of morphine for pain. Hypoxia possibly related to both pneumonia as well as morphine administration Continue Rocephin and azithromycin Supplemental oxygen Antitussives, albuterol as needed wheezing Aspiration precautions  Abdominal pain, acute on chronic Chronic pancreatitis Patient with chronic pain from chronic pancreatitis CT abdomen and pelvis showing mild colitis and multiple pancreatic pseudocysts Labs not correlating with acute pancreatitis Pain is on right flank so we will get a UA Clear liquids Pain control Patient is followed by GI last seen a couple weeks prior  Pancreatic pseudocyst Lipase and LFTs normal  Hypokalemia Potassium  3 Replete and monitor  Seizure disorder (HCC) Continue carbamazepine lamotrigine and gabapentin Keppra  Drug-induced constipation Mild colitis on CT Patient is on multiple laxatives-states she had 2 bowel movements on the day of arrival        DVT prophylaxis: Lovenox  Consults: none  Advance Care Planning:   Code Status: Prior   Family Communication: none  Disposition Plan: Back to previous home environment  Severity of Illness: The appropriate patient status for this patient is OBSERVATION. Observation status is judged to be reasonable and necessary in order to provide the required intensity of service to ensure the patient's safety. The patient's presenting symptoms, physical exam findings, and initial radiographic and laboratory data in the context of their medical condition is felt to place them at decreased risk for further clinical deterioration. Furthermore, it is anticipated that the patient will be medically stable for discharge from the hospital within 2 midnights of admission.   Author: Andris Baumann, MD 11/02/2023 4:53 AM  For on call review [email or web address].

## 2023-11-02 NOTE — ED Notes (Addendum)
 Patient put on bedpan. Patient pulled off oxygen and RN put 2L [place] back onto patient.

## 2023-11-02 NOTE — Evaluation (Signed)
 Clinical/Bedside Swallow Evaluation Patient Details  Name: Abigail [name] [facility] MRN: [id number] Date of Birth: September 19, 1965  Today's Date: 11/02/2023 Time: SLP Start Time (ACUTE ONLY): 0910 SLP Stop Time (ACUTE ONLY): 0940 SLP Time Calculation (min) (ACUTE ONLY): 30 min  Past Medical History:  Past Medical History:  Diagnosis Date   Anxiety    Arthritis    Back pain    Basal cell carcinoma 10/04/2021   R axilla - needs excised 11/28/21   Basal cell carcinoma 10/04/2021   L antecubital excised 11/14/21   Diarrhea 11/12/2016   Fibromyalgia    Generalized abdominal pain 11/12/2016   H. pylori infection    Hyperlipidemia    IBS (irritable bowel syndrome)    Infectious colitis 04/29/2016   Migraines    Moderate dehydration 04/29/2016   Muscle pain    Opioid overdose (HCC) 12/07/2022   Reflux    Seizures (HCC)    Unexplained weight loss 11/12/2016   Past Surgical History:  Past Surgical History:  Procedure Laterality Date   ABDOMINAL HYSTERECTOMY     APPENDECTOMY  2009   C5 FUSION     C6 FUSION     C7 FUSION     COLONOSCOPY  02/2006   COLONOSCOPY WITH PROPOFOL N/A 12/25/2016   Procedure: COLONOSCOPY WITH PROPOFOL;  Surgeon: Earline Mayotte, MD;  Location: [facility] ENDOSCOPY;  Service: Endoscopy;  Laterality: N/A;   ESOPHAGOGASTRODUODENOSCOPY (EGD) WITH PROPOFOL N/A 12/25/2016   Procedure: ESOPHAGOGASTRODUODENOSCOPY (EGD) WITH PROPOFOL;  Surgeon: Earline Mayotte, MD;  Location: [facility] ENDOSCOPY;  Service: Endoscopy;  Laterality: N/A;   FOOT SURGERY     HIP ARTHROPLASTY     L4 FUSION     L5 FUSION     S1 FUSION     HPI:  Abigail [name] is a 58 y.o. female with medical history significant for Chronic pancreatitis and pancreatic pseudocyst, chronic pain on opiates with opioid-induced constipation on multiple laxatives, seizure disorder, COPD on prn O2, history of giant cell arteritis no longer on steroids, depression, hypothyroidism, being admitted with multifocal  pneumonia, abdominal pain and hypoxia. Pt recently seen by SLP from 10/09/23-10/10/23, with recommendations for regular solids and thin liquids by time of discharge. DG Chest 11/02/23: 1. No change in bilateral streaky pulmonary opacities with a lower lung zone predominance. Findings may reflect atelectasis and/or pneumonia. Low lung volumes.    Assessment / Plan / Recommendation  Clinical Impression  Pt seen for bedside swallow assessment in the setting of concern for aspiration PNA. Pt is currently on clear liquids diet and 2L nasal canula. Pt resistant to upright positioning secondary to abdominal pain, agreeable to partially elevated HOB and angling bed for more upright positioning. Hoarse, low vocal intensity noted, with pt reporting that throat is dry/sore. Pt seen with trials of thin liquids (via straw and cup and spoon) and regular solids. Independent with feeding with allowance for extended time. Pt with immediate cough x1 with straw sip of thin liquid, not repeated with trials of cup sips. No change to vocal quality across trials. Vitals stable for duration of trials (maintaining 100 O2 saturations on 2L nasal canula). Congested cough noted delayed following PO trials, with pt reporting intermittent congested cough at baseline.  Oral phase grossly intact- mildly prolonged mastication, with pt demonstrating minimal effort for completion of trial and reduced endurance. Pt with recent history of nausea/dry heaving and low appetite and persistent constipation- all impacting quality/quantity of PO intake.   Based on current debility, report of  nausea, limited tolerance of upright positioning, current pulmonary/respiratory status, and low endurance, pt is at increased risk of aspiration during/after the swallow, therefore recommend aspiration precautions (slow rate, small bites, elevated HOB during and after PO intake). Monitor use of straws. Recommend advancement to solids as medically indicated with  continued thin liquids. SLP will monitor for continued safety with current diet. MD and RN aware of recommendations.  SLP Visit Diagnosis: Dysphagia, oropharyngeal phase (R13.12)    Aspiration Risk  Mild aspiration risk    Diet Recommendation   Thin;Dysphagia 3 (mechanical soft)  Medication Administration: Whole meds with puree (vs liquid. Pt reports challenge with large pills)    Other  Recommendations Oral Care Recommendations: Oral care BID    Recommendations for follow up therapy are one component of a multi-disciplinary discharge planning process, led by the attending physician.  Recommendations may be updated based on patient status, additional functional criteria and insurance authorization.  Follow up Recommendations No SLP follow up      Assistance Recommended at Discharge  Supervision for PO intake   Functional Status Assessment Patient has had a recent decline in their functional status and demonstrates the ability to make significant improvements in function in a reasonable and predictable amount of time.  Frequency and Duration min 2x/week  1 week       Prognosis Prognosis for improved oropharyngeal function: Good Barriers to Reach Goals: Other (Comment) (reduced appetite and motivation for eating- baseline nausea)      Swallow Study   General Date of Onset: 11/02/23 HPI: Abigail [name] is a 58 y.o. female with medical history significant for Chronic pancreatitis and pancreatic pseudocyst, chronic pain on opiates with opioid-induced constipation on multiple laxatives, seizure disorder, COPD on prn O2, history of giant cell arteritis no longer on steroids, depression, hypothyroidism, being admitted with multifocal pneumonia, abdominal pain and hypoxia. Pt recently seen by SLP from 10/09/23-10/10/23, with recommendations for regular solids and thin liquids by time of discharge. DG Chest 11/02/23: 1. No change in bilateral streaky pulmonary opacities with a lower  lung zone predominance. Findings may reflect atelectasis and/or pneumonia. Low lung volumes. Type of Study: Bedside Swallow Evaluation Previous Swallow Assessment: CSE 10/09/23 recommeding mech soft and thin liquids Diet Prior to this Study: Clear liquid diet Temperature Spikes Noted: No (Temp 98.7; WBC 9.4) Respiratory Status: Nasal cannula (2L) History of Recent Intubation: No Behavior/Cognition: Alert;Cooperative;Lethargic/Drowsy Oral Cavity Assessment: Within Functional Limits Oral Care Completed by SLP: Yes Oral Cavity - Dentition: Missing dentition Vision: Functional for self-feeding Self-Feeding Abilities: Able to feed self (with extended time) Patient Positioning: Postural control adequate for testing;Partially reclined Baseline Vocal Quality: Hoarse;Low vocal intensity Volitional Cough: Strong;Congested Volitional Swallow: Able to elicit    Oral/Motor/Sensory Function Overall Oral Motor/Sensory Function: Within functional limits   Ice Chips Ice chips: Not tested   Thin Liquid Thin Liquid: Impaired Presentation: Straw;Cup Oral Phase Impairments:  (none) Oral Phase Functional Implications:  (none) Pharyngeal  Phase Impairments: Cough - Immediate;Cough - Delayed (immediate cough with straw; delayed cough with cup- though suspect related to baseline congestion)    Nectar Thick Nectar Thick Liquid: Not tested   Honey Thick Honey Thick Liquid: Not tested   Puree Puree: Not tested Other Comments: pt deferred puree   Solid     Solid: Impaired Presentation: Self Fed Oral Phase Impairments: Impaired mastication Oral Phase Functional Implications: Impaired mastication;Prolonged oral transit Pharyngeal Phase Impairments:  (none)     Abigail Jorgeluis Gurganus Clapp, MS, CCC-SLP Speech Language Pathologist  Rehab Services; [facility] [place] [phone] (ascom)   Abigail [name] 11/02/2023,11:05 AM

## 2023-11-02 NOTE — Assessment & Plan Note (Addendum)
 Chronic pancreatitis Patient with chronic pain from chronic pancreatitis CT abdomen and pelvis showing mild colitis and multiple pancreatic pseudocysts Labs not correlating with acute pancreatitis Pain is on right flank so we will get a UA Clear liquids Pain control Patient is followed by GI last seen a couple weeks prior

## 2023-11-02 NOTE — Discharge Instructions (Addendum)
 Fortunately your evaluation in the Emergency Department did not show any emergency conditions that account for your pain.  Thank you for choosing [place] for your health care today!  Please see your primary doctor this week for a follow up appointment.   If you have any new, worsening, or unexpected symptoms call your doctor right away or come back to the emergency department for reevaluation.  It was my pleasure to care for you today.   Daneil Dan [place]esto Charon, MD

## 2023-11-02 NOTE — Assessment & Plan Note (Signed)
 Acute respiratory failure with hypoxia CT abdomen and pelvis consistent with aspiration pneumonia Was hypoxic to 80 degrees following administration of morphine for pain. Hypoxia possibly related to both pneumonia as well as morphine administration Continue Rocephin and azithromycin Supplemental oxygen Antitussives, albuterol as needed wheezing Aspiration precautions

## 2023-11-02 NOTE — ED Notes (Signed)
 Pt received to ED 30 via bed. Pt support person at bedside.

## 2023-11-02 NOTE — Progress Notes (Addendum)
 Patient was seen and examined at the bedside in the emergency department.  Interval events noted.  She complains of abdominal pain.  Breathing is better.  She said she uses 2 L oxygen at home, usually at night and with activity. Vital signs are stable. Continue empiric IV antibiotics for pneumonia.  Replete potassium and monitor levels.  Analgesics as needed for pain.  Continue current medications. She was evaluated by the speech therapist on dysphagia 3 diet was recommended.

## 2023-11-02 NOTE — Hospital Course (Signed)
 GCA off steroid, COPD, seizure disorder chronic back pain on narcotics, recurrent pancreatitis, mild intermittent asthma, chronic hypoxic respiratory failure on 2 L

## 2023-11-03 DIAGNOSIS — J690 Pneumonitis due to inhalation of food and vomit: Secondary | ICD-10-CM | POA: Diagnosis not present

## 2023-11-03 LAB — CBC
HCT: 29.1 % — ABNORMAL LOW (ref 36.0–46.0)
Hemoglobin: 9.3 g/dL — ABNORMAL LOW (ref 12.0–15.0)
MCH: 30.1 pg (ref 26.0–34.0)
MCHC: 32 g/dL (ref 30.0–36.0)
MCV: 94.2 fL (ref 80.0–100.0)
Platelets: 223 10*3/uL (ref 150–400)
RBC: 3.09 MIL/uL — ABNORMAL LOW (ref 3.87–5.11)
RDW: 15.6 % — ABNORMAL HIGH (ref 11.5–15.5)
WBC: 4.6 10*3/uL (ref 4.0–10.5)
nRBC: 0 % (ref 0.0–0.2)

## 2023-11-03 LAB — BASIC METABOLIC PANEL
Anion gap: 5 (ref 5–15)
BUN: <5 mg/dL — ABNORMAL LOW (ref 6–20)
CO2: 31 mmol/L (ref 22–32)
Calcium: 7 mg/dL — ABNORMAL LOW (ref 8.9–10.3)
Chloride: 100 mmol/L (ref 98–111)
Creatinine, Ser: 0.41 mg/dL — ABNORMAL LOW (ref 0.44–1.00)
GFR, Estimated: >60 mL/min (ref >60)
Glucose, Bld: 80 mg/dL (ref 70–99)
Potassium: 2.5 mmol/L — CL (ref 3.5–5.1)
Sodium: 136 mmol/L (ref 135–145)

## 2023-11-03 LAB — FERRITIN: Ferritin: 149 ng/mL (ref 11–307)

## 2023-11-03 LAB — IRON AND TIBC
Iron: 101 ug/dL (ref 28–170)
Saturation Ratios: 80 % — ABNORMAL HIGH (ref 10.4–31.8)
TIBC: 126 ug/dL — ABNORMAL LOW (ref 250–450)
UIBC: 25 ug/dL

## 2023-11-03 LAB — PHOSPHORUS: Phosphorus: 2.2 mg/dL — ABNORMAL LOW (ref 2.5–4.6)

## 2023-11-03 LAB — VITAMIN B12: Vitamin B-12: 1153 pg/mL — ABNORMAL HIGH (ref 180–914)

## 2023-11-03 LAB — MAGNESIUM: Magnesium: 1.6 mg/dL — ABNORMAL LOW (ref 1.7–2.4)

## 2023-11-03 MED ORDER — K PHOS MONO-SOD PHOS DI & MONO 155-852-130 MG PO TABS
500.0000 mg | ORAL_TABLET | Freq: Three times a day (TID) | ORAL | Status: AC
  Administered 2023-11-03 (×3): 500 mg via ORAL
  Filled 2023-11-03 (×3): qty 2

## 2023-11-03 MED ORDER — POTASSIUM CHLORIDE 10 MEQ/100ML IV SOLN
10.0000 meq | INTRAVENOUS | Status: AC
Start: 2023-11-03 — End: 2023-11-03
  Administered 2023-11-03 (×3): 10 meq via INTRAVENOUS
  Filled 2023-11-03 (×3): qty 100

## 2023-11-03 MED ORDER — MAGNESIUM SULFATE 2 GM/50ML IV SOLN
2.0000 g | Freq: Once | INTRAVENOUS | Status: AC
  Administered 2023-11-03: 2 g via INTRAVENOUS
  Filled 2023-11-03: qty 50

## 2023-11-03 MED ORDER — POTASSIUM CHLORIDE CRYS ER 20 MEQ PO TBCR
40.0000 meq | EXTENDED_RELEASE_TABLET | Freq: Once | ORAL | Status: AC
  Administered 2023-11-03: 40 meq via ORAL
  Filled 2023-11-03: qty 2

## 2023-11-03 NOTE — Evaluation (Signed)
 Occupational Therapy Evaluation Patient Details Name: Abigail [name] [facility] MRN: [id number] DOB: Apr 15, 1966 Today's Date: 11/03/2023   History of Present Illness   Pt is a 58 y/o F admitted on 11/02/23 after presenting with c/o cough & upper right sided abdominal pain. Pt is being treated for multifocal PNA, abdominal pain & hypoxia. PMH: chronic pancreatitis, pancreatic pseudocyst, chronic pain on opiates, seizure disorder, COPD on PRN O2, giant cell arthritis, depression, hypothyroidism, anxiety, fibromyalgia   Clinical Impressions [name] was seen for OT evaluation this date. Prior to hospital admission, pt was IND, recently using 4WW. Pt lives with son in home c ramped entrance. Pt currently requires SBA + RW for ADL t/f ~100 ft and standing grooming tasks. SpO2 88% on RA end of session. Pt appears near baseline, no skilled acute OT needs identified, will sign off. Upon hospital discharge, recommend no OT follow up.    If plan is discharge home, recommend the following:   Supervision due to cognitive status;Help with stairs or ramp for entrance     Functional Status Assessment   Patient has not had a recent decline in their functional status     Equipment Recommendations   None recommended by OT     Recommendations for Other Services         Precautions/Restrictions   Precautions Precautions: Fall Restrictions Weight Bearing Restrictions Per Provider Order: No     Mobility Bed Mobility Overal bed mobility: Modified Independent                  Transfers Overall transfer level: Needs assistance Equipment used: Rolling walker (2 wheels) Transfers: Sit to/from Stand Sit to Stand: Contact guard assist                  Balance Overall balance assessment: Needs assistance   Sitting balance-Leahy Scale: Good     Standing balance support: No upper extremity supported, During functional activity Standing balance-Leahy Scale: Good                              ADL either performed or assessed with clinical judgement   ADL Overall ADL's : Needs assistance/impaired                                       General ADL Comments: SBA + RW for ADL t/f ~100 ft and standing grooming tasks.      Pertinent Vitals/Pain Pain Assessment Pain Assessment: No/denies pain     Extremity/Trunk Assessment Upper Extremity Assessment Upper Extremity Assessment: Overall WFL for tasks assessed   Lower Extremity Assessment Lower Extremity Assessment: Overall WFL for tasks assessed   Cervical / Trunk Assessment Cervical / Trunk Assessment: Kyphotic   Communication Communication Communication: No apparent difficulties   Cognition Arousal: Alert Behavior During Therapy: WFL for tasks assessed/performed Cognition: No apparent impairments                               Following commands: Intact                  Home Living Family/patient expects to be discharged to:: Private residence Living Arrangements: Children Available Help at Discharge: Family;Available 24 hours/day Type of Home: Mobile home Home Access: Ramped entrance     Home Layout: One level  Home Equipment: Agricultural consultant (2 wheels);Wheelchair - Set designer (4 wheels);Standard Walker          Prior Functioning/Environment Prior Level of Function : Needs assist             Mobility Comments: Pt reports she's ambulatory without AD but has started using rollator recently 2/2 weakness.      OT Problem List: Decreased strength;Decreased activity tolerance;Decreased knowledge of use of DME or AE;Decreased safety awareness;Impaired balance (sitting and/or standing);Decreased cognition;Pain   OT Treatment/Interventions:        OT Goals(Current goals can be found in the care plan section)   Acute Rehab OT Goals Patient Stated Goal: to go home OT Goal Formulation: With  patient Time For Goal Achievement: 11/03/23 Potential to Achieve Goals: Good   OT Frequency:       Co-evaluation PT/OT/SLP Co-Evaluation/Treatment: Yes Reason for Co-Treatment: To address functional/ADL transfers PT goals addressed during session: Mobility/safety with mobility;Balance OT goals addressed during session: ADL's and self-care      AM-PAC OT "6 Clicks" Daily Activity     Outcome Measure Help from another person eating meals?: None Help from another person taking care of personal grooming?: A Little Help from another person toileting, which includes using toliet, bedpan, or urinal?: A Little Help from another person bathing (including washing, rinsing, drying)?: A Little Help from another person to put on and taking off regular upper body clothing?: None Help from another person to put on and taking off regular lower body clothing?: A Little 6 Click Score: 20   End of Session Equipment Utilized During Treatment: Rolling walker (2 wheels)  Activity Tolerance: Patient tolerated treatment well Patient left: in chair;with call bell/phone within reach;with chair alarm set  OT Visit Diagnosis: Other abnormalities of gait and mobility (R26.89);Muscle weakness (generalized) (M62.81)                Time: 9147-8295 OT Time Calculation (min): 13 min Charges:  OT General Charges $OT Visit: 1 Visit OT Evaluation $OT Eval Low Complexity: 1 Low  Kathie Dike, M.S. OTR/L  11/03/23, 12:31 PM  ascom [phone]

## 2023-11-03 NOTE — Progress Notes (Signed)
 Progress Note    Abigail [name] [facility]  WUJ:[id number] DOB: November 26, 1965  DOA: 11/02/2023 PCP: Alease Medina, MD      Brief Narrative:    Medical records reviewed and are as summarized below:  Zalika [facility] is a 58 y.o. female with medical history significant for chronic pancreatitis and pancreatic pseudocyst, chronic back pain pain on opiates with opioid-induced constipation on multiple laxatives, seizure disorder, COPD, chronic hypoxic respiratory failure on prn O2, history of giant cell arteritis no longer on steroids, depression, hypothyroidism, recent discharge from the hospital on 10/26/2023 after hospitalization for back pain, diarrhea and hypokalemia.  She presented to the hospital with cough and abdominal pain.  Oxygen saturation was 84% when EMS picked her up.  She was subsequently placed on 2 L of oxygen.  CT abdomen and pelvis showed aspiration or pneumonia in the lower lungs, acute on chronic pancreatitis with multiple pseudocysts, similar mild colitis, marked hepatic steatosis and aortic atherosclerosis.     Assessment/Plan:   Principal Problem:   Aspiration pneumonia (HCC) Active Problems:   Abdominal pain, acute on chronic   Acute respiratory failure with hypoxia (HCC)   Chronic prescription opiate use - thru [facility] in [place], [place]   Pancreatic pseudocyst   Hypokalemia   Chronic pain   Drug-induced constipation   Seizure disorder (HCC)   Hypothyroidism    Body mass index is 19.77 kg/m.   Bilateral lower lobe pneumonia, probable aspiration pneumonia: Continue IV Rocephin and azithromycin Speech therapist recommended dysphagia 3 diet   Acute on chronic hypoxic respiratory failure: She is on 2 L/min oxygen via [place].  She uses 2 L/min oxygen at nighttime and with activity at home.  She does not use it 24/7 at home.   Chronic pancreatitis, pancreatic pseudocysts, abdominal pain: Lipase level was normal.  No acute pancreatitis at this  time.  Analgesics as needed for pain. Mild colitis on CT abdomen pelvis.  Recently discharged from the hospital on 11/05/2023 after hospitalization for diarrhea.  No acute diarrhea at this time.   Hypokalemia: Potassium is down to 2.5.  Replete potassium orally and intravenously. She may have to be discharged on potassium supplement Hypomagnesemia: Magnesium 1.6.  Replete with IV magnesium sulfate. Hypophosphatemia: Phosphorus 2.2.  Replete with oral potassium phosphate.  Monitor electrolytes.   Seizure disorder: Continue home antiepileptics   General Weakness: PT and OT evaluation Hypoalbuminemia: Encourage adequate oral intake   Comorbidities include depression, opioid-induced constipation, chronic back pain,   Diet Order             DIET DYS 3 Room service appropriate? Yes; Fluid consistency: Thin  Diet effective now                            Consultants: None  Procedures: None    Medications:    carbamazepine  100 mg Oral BID   DULoxetine  90 mg Oral Daily   enoxaparin (LOVENOX) injection  40 mg Subcutaneous Q24H   gabapentin  300 mg Oral QID   guaiFENesin  600 mg Oral BID   lamoTRIgine  50 mg Oral Daily   Followed by   Melene Muller ON 11/08/2023] lamoTRIgine  50 mg Oral BID   Followed by   Melene Muller ON 11/22/2023] lamoTRIgine  100 mg Oral BID   levETIRAcetam  500 mg Oral BID   mirtazapine  30 mg Oral QHS   nicotine  14 mg Transdermal Daily  pantoprazole  40 mg Oral Daily   phosphorus  500 mg Oral TID   polyethylene glycol  17 g Oral Daily   senna-docusate  1 tablet Oral BID   Continuous Infusions:  azithromycin 500 mg (11/03/23 0622)   cefTRIAXone (ROCEPHIN)  IV 2 g (11/03/23 0539)     Anti-infectives (From admission, onward)    Start     Dose/Rate Route Frequency Ordered Stop   11/03/23 0500  azithromycin (ZITHROMAX) 500 mg in sodium chloride 0.9 % 250 mL IVPB        500 mg 250 mL/hr over 60 Minutes Intravenous Every 24 hours 11/02/23 0456  11/07/23 0459   11/03/23 0400  cefTRIAXone (ROCEPHIN) 2 g in sodium chloride 0.9 % 100 mL IVPB        2 g 200 mL/hr over 30 Minutes Intravenous Every 24 hours 11/02/23 0456 11/07/23 0359   11/02/23 0530  cefTRIAXone (ROCEPHIN) 1 g in sodium chloride 0.9 % 100 mL IVPB        1 g 200 mL/hr over 30 Minutes Intravenous  Once 11/02/23 0528 11/02/23 0638   11/02/23 0415  cefTRIAXone (ROCEPHIN) 1 g in sodium chloride 0.9 % 100 mL IVPB        1 g 200 mL/hr over 30 Minutes Intravenous  Once 11/02/23 0410 11/02/23 0452   11/02/23 0415  azithromycin (ZITHROMAX) 500 mg in sodium chloride 0.9 % 250 mL IVPB        500 mg 250 mL/hr over 60 Minutes Intravenous  Once 11/02/23 0410 11/02/23 0605              Family Communication/Anticipated D/C date and plan/Code Status   DVT prophylaxis: enoxaparin (LOVENOX) injection 40 mg Start: 11/02/23 0800     Code Status: Full Code  Family Communication: None Disposition Plan: Plan to discharge home   Status is: Observation The patient will require care spanning > 2 midnights and should be moved to inpatient because: Pneumonia, abnormal electrolytes       Subjective:   Events noted.  She complains of pain in the left lower chest area and pain in the right upper abdominal area.  No shortness of breath or cough.  She feels weak.  Objective:    Vitals:   11/02/23 1808 11/02/23 1955 11/03/23 0613 11/03/23 0754  BP: 95/61 110/68 98/65 (!) 106/52  Pulse: 87 69 72 74  Resp: 15 16 19 18   Temp: 98.1 F (36.7 C) 98.6 F (37 C) 97.7 F (36.5 C) 97.9 F (36.6 C)  TempSrc:  Oral    SpO2: 100% 100% 99% 100%  Weight:      Height:       No data found.   Intake/Output Summary (Last 24 hours) at 11/03/2023 1133 Last data filed at 11/03/2023 0900 Gross per 24 hour  Intake 240 ml  Output --  Net 240 ml   Filed Weights   11/02/23 0050  Weight: 59 kg    Exam:  GEN: NAD SKIN: Warm and dry EYES: No pallor or icterus ENT: MMM CV:  RRR PULM: CTA B ABD: soft, ND, NT, +BS CNS: AAO x 3, non focal EXT: No edema or tenderness        Data Reviewed:   I have personally reviewed following labs and imaging studies:  Labs: Labs show the following:   Basic Metabolic Panel: Recent Labs  Lab 11/02/23 0111 11/03/23 0539  NA 136 136  K 3.0* 2.5*  CL 98 100  CO2 29 31  GLUCOSE 105* 80  BUN <5* <5*  CREATININE 0.50 0.41*  CALCIUM 7.4* 7.0*  MG 1.7 1.6*  PHOS  --  2.2*   GFR Estimated Creatinine Clearance: 71.4 mL/min (A) (by C-G formula based on SCr of 0.41 mg/dL (L)). Liver Function Tests: Recent Labs  Lab 11/02/23 0111  AST 22  ALT 15  ALKPHOS 107  BILITOT 1.1  PROT 6.3*  ALBUMIN 1.9*   Recent Labs  Lab 11/02/23 0111  LIPASE 25   No results for input(s): "AMMONIA" in the last 168 hours. Coagulation profile No results for input(s): "INR", "PROTIME" in the last 168 hours.  CBC: Recent Labs  Lab 11/02/23 0111 11/03/23 0539  WBC 9.4 4.6  NEUTROABS 7.6  --   HGB 11.2* 9.3*  HCT 35.1* 29.1*  MCV 94.4 94.2  PLT 288 223   Cardiac Enzymes: No results for input(s): "CKTOTAL", "CKMB", "CKMBINDEX", "TROPONINI" in the last 168 hours. BNP (last 3 results) No results for input(s): "PROBNP" in the last 8760 hours. CBG: No results for input(s): "GLUCAP" in the last 168 hours. D-Dimer: No results for input(s): "DDIMER" in the last 72 hours. Hgb A1c: No results for input(s): "HGBA1C" in the last 72 hours. Lipid Profile: No results for input(s): "CHOL", "HDL", "LDLCALC", "TRIG", "CHOLHDL", "LDLDIRECT" in the last 72 hours. Thyroid function studies: No results for input(s): "TSH", "T4TOTAL", "T3FREE", "THYROIDAB" in the last 72 hours.  Invalid input(s): "FREET3" Anemia work up: Recent Labs    11/03/23 0539  FERRITIN 149  TIBC 126*  IRON 101   Sepsis Labs: Recent Labs  Lab 11/02/23 0104 11/02/23 0111 11/03/23 0539  WBC  --  9.4 4.6  LATICACIDVEN 1.4  --   --     Microbiology No  results found for this or any previous visit (from the past 240 hours).  Procedures and diagnostic studies:  DG Chest Port 1 View Result Date: 11/02/2023 CLINICAL DATA:  Evaluate for pneumonia. EXAM: PORTABLE CHEST 1 VIEW COMPARISON:  10/23/2023 FINDINGS: Stable cardiomediastinal contours. Lung volumes are low. Asymmetric elevation of the right hemidiaphragm. Bilateral streaky pulmonary opacities are identified with a lower lung zone predominance. Unchanged compared with the previous exam. Pulmonary vascular congestion also unchanged. No new findings. IMPRESSION: 1. No change in bilateral streaky pulmonary opacities with a lower lung zone predominance. Findings may reflect atelectasis and/or pneumonia. 2. Low lung volumes. Electronically Signed   By: Signa Kell M.D.   On: 11/02/2023 05:16   CT ABDOMEN PELVIS W CONTRAST Result Date: 11/02/2023 CLINICAL DATA:  Right lower quadrant abdominal pain with nausea EXAM: CT ABDOMEN AND PELVIS WITH CONTRAST TECHNIQUE: Multidetector CT imaging of the abdomen and pelvis was performed using the standard protocol following bolus administration of intravenous contrast. RADIATION DOSE REDUCTION: This exam was performed according to the departmental dose-optimization program which includes automated exposure control, adjustment of the mA and/or kV according to patient size and/or use of iterative reconstruction technique. CONTRAST:  OMNIPAQUE IOHEXOL 300 MG/ML  SOLN COMPARISON:  CT abdomen pelvis 09/24/2023 FINDINGS: Lower chest: Patchy airspace opacities in the right middle lobe and bilateral lower lobes compatible with multifocal pneumonia. Hepatobiliary: Marked hepatic steatosis. Increased size of the subcapsular fluid collection along the anterior left hepatic lobe now measuring 5.1 x 2.7 cm. Adjacent hyperemic liver parenchyma. Pericholecystic fluid about the gallbladder fundus. No biliary dilation. Pancreas: Similar pancreatic atrophy. Large loculated  thick-walled cystic structure in the pancreatic head has decreased in size now measuring 4.3 cm, previously 7.6 cm. This likely represents  a chronic pseudocyst. New bilobed pseudocyst in the pancreatic body measuring 2.6 cm. Decreased size of the pseudocyst if the body/tail junction now measuring 1.9 cm, previously 2.8 cm. Decreased size of the pseudocyst in the pancreatic tail now measuring 5.4 cm, previously 7.8 cm. This fluid collection extends between the diaphragm and superior spleen. The previous collection in the gastrohepatic ligament has nearly resolved now measuring 2.4 cm, previously 7.6 cm. Similar stranding and fluid about the pseudocyst in the pancreatic head. Spleen: Unremarkable. Adrenals/Urinary Tract: Stable adrenal glands. No urinary calculi or hydronephrosis. Unremarkable bladder. Stomach/Bowel: Stomach is within normal limits. Reactive wall thickening about the duodenum. No bowel obstruction. Scattered areas of wall thickening throughout the colon similar to prior. Vascular/Lymphatic: Aortic atherosclerosis. No enlarged abdominal or pelvic lymph nodes. Patent portal vein. Reproductive: Unremarkable. Other: No free intraperitoneal air. Musculoskeletal: Right THA. Anterior fusion L4-S1. Chronic compression fractures of multiple thoracic and lumbar vertebral bodies. IMPRESSION: 1. Aspiration or pneumonia in the lower lungs. 2. Acute on chronic pancreatitis with multiple pseudocysts. Some of the pseudocyst have increased in size while others have decreased. 3. Similar mild colitis. 4. Marked hepatic steatosis. 5. Aortic Atherosclerosis (ICD10-I70.0). Electronically Signed   By: Minerva Fester M.D.   On: 11/02/2023 03:29               LOS: 0 days   Jaryn Rosko  [facility]   Pager on [email or web address]. If 7PM-7AM, please contact night-coverage at [email or web address]     11/03/2023, 11:33 AM

## 2023-11-03 NOTE — Care Management Obs Status (Signed)
 MEDICARE OBSERVATION STATUS NOTIFICATION   Patient Details  Name: Abigail [name] [facility] MRN: [id number] Date of Birth: Aug 27, 1965   Medicare Observation Status Notification Given:  Yes    Bing Quarry, RN 11/03/2023,4:00pm

## 2023-11-03 NOTE — Evaluation (Signed)
 Physical Therapy Evaluation Patient Details Name: Abigail [name] [facility] MRN: [id number] DOB: [birth year] Today's Date: 11/03/2023  History of Present Illness  Pt is a 58 y/o F admitted on 11/02/23 after presenting with c/o cough & upper right sided abdominal pain. Pt is being treated for multifocal PNA, abdominal pain & hypoxia. PMH: chronic pancreatitis, pancreatic pseudocyst, chronic pain on opiates, seizure disorder, COPD on PRN O2, giant cell arthritis, depression, hypothyroidism, anxiety, fibromyalgia  Clinical Impression  Pt seen for PT evaluation with pt agreeable to tx. Pt reports prior to admission she was ambulatory without AD but had started using a rollator recently 2/2 weakness. On this date, pt is able to complete bed mobility with supervision, STS & gait with RW & CGA. Pt tolerates mobility well. Anticipate pt will progress quickly.        If plan is discharge home, recommend the following: A little help with walking and/or transfers;A little help with bathing/dressing/bathroom;Assistance with cooking/housework;Assist for transportation;Help with stairs or ramp for entrance   Can travel by private vehicle   Yes    Equipment Recommendations None recommended by PT  Recommendations for Other Services       Functional Status Assessment Patient has had a recent decline in their functional status and demonstrates the ability to make significant improvements in function in a reasonable and predictable amount of time.     Precautions / Restrictions Precautions Precautions: Fall Restrictions Weight Bearing Restrictions Per Provider Order: No      Mobility  Bed Mobility Overal bed mobility: Needs Assistance Bed Mobility: Supine to Sit     Supine to sit: HOB elevated, Used rails, Supervision          Transfers Overall transfer level: Needs assistance Equipment used: Rolling walker (2 wheels) Transfers: Sit to/from Stand Sit to Stand: Contact guard assist            General transfer comment: STS from EOB with RW    Ambulation/Gait Ambulation/Gait assistance: Contact guard assist Gait Distance (Feet): 100 Feet Assistive device: Rolling walker (2 wheels) Gait Pattern/deviations: Decreased step length - left, Decreased step length - right, Decreased stride length Gait velocity: decreased     General Gait Details: Pt ambulates around nursing pod with RW & CGA without LOB.  Stairs            Wheelchair Mobility     Tilt Bed    Modified Rankin (Stroke Patients Only)       Balance Overall balance assessment: Needs assistance   Sitting balance-Leahy Scale: Good     Standing balance support: Single extremity supported, No upper extremity supported, During functional activity Standing balance-Leahy Scale: Good Standing balance comment: standing at sink to perform hand hygiene without LOB                             Pertinent Vitals/Pain Pain Assessment Pain Assessment: No/denies pain    Home Living Family/patient expects to be discharged to:: Private residence Living Arrangements: Children Available Help at Discharge: Family;Available 24 hours/day Type of Home: Mobile home Home Access: Ramped entrance       Home Layout: One level Home Equipment: Agricultural consultant (2 wheels);Wheelchair - Set designer (4 wheels);Standard Walker      Prior Function Prior Level of Function : Needs assist             Mobility Comments: Pt reports she's ambulatory without AD but has started using rollator recently 2/2  weakness.       Extremity/Trunk Assessment   Upper Extremity Assessment Upper Extremity Assessment: Overall WFL for tasks assessed    Lower Extremity Assessment Lower Extremity Assessment: Overall WFL for tasks assessed;Generalized weakness    Cervical / Trunk Assessment Cervical / Trunk Assessment: Kyphotic  Communication        Cognition Arousal: Alert Behavior During  Therapy: WFL for tasks assessed/performed   PT - Cognitive impairments: No apparent impairments                         Following commands: Intact       Cueing       General Comments General comments (skin integrity, edema, etc.): SpO2 92%, HR 105 bpm at end of session.    Exercises     Assessment/Plan    PT Assessment Patient needs continued PT services  PT Problem List Decreased strength;Decreased activity tolerance;Decreased balance;Decreased mobility;Decreased knowledge of use of DME       PT Treatment Interventions DME instruction;Gait training;Functional mobility training;Therapeutic activities;Therapeutic exercise;Balance training;Patient/family education;Neuromuscular re-education    PT Goals (Current goals can be found in the Care Plan section)  Acute Rehab PT Goals Patient Stated Goal: none stated PT Goal Formulation: With patient Time For Goal Achievement: 11/17/23 Potential to Achieve Goals: Good    Frequency Min 2X/week     Co-evaluation PT/OT/SLP Co-Evaluation/Treatment: Yes Reason for Co-Treatment:  (unsure of pt's activity tolerance, 2/2 low K+) PT goals addressed during session: Mobility/safety with mobility;Balance         AM-PAC PT "6 Clicks" Mobility  Outcome Measure Help needed turning from your back to your side while in a flat bed without using bedrails?: None Help needed moving from lying on your back to sitting on the side of a flat bed without using bedrails?: A Little Help needed moving to and from a bed to a chair (including a wheelchair)?: A Little Help needed standing up from a chair using your arms (e.g., wheelchair or bedside chair)?: A Little Help needed to walk in hospital room?: A Little Help needed climbing 3-5 steps with a railing? : A Little 6 Click Score: 19    End of Session   Activity Tolerance: Patient tolerated treatment well Patient left: in chair;with call bell/phone within reach;with chair alarm  set Nurse Communication: Mobility status PT Visit Diagnosis: Muscle weakness (generalized) (M62.81);Unsteadiness on feet (R26.81)    Time: 8119-1478 PT Time Calculation (min) (ACUTE ONLY): 19 min   Charges:   PT Evaluation $PT Eval Low Complexity: 1 Low   PT General Charges $$ ACUTE PT VISIT: 1 Visit         Aleda Grana, PT, DPT 11/03/23, 12:19 PM   Sandi Mariscal 11/03/2023, 12:18 PM

## 2023-11-04 DIAGNOSIS — J690 Pneumonitis due to inhalation of food and vomit: Secondary | ICD-10-CM | POA: Diagnosis not present

## 2023-11-04 LAB — URINALYSIS, ROUTINE W REFLEX MICROSCOPIC
Bilirubin Urine: NEGATIVE
Glucose, UA: NEGATIVE mg/dL
Hgb urine dipstick: NEGATIVE
Ketones, ur: NEGATIVE mg/dL
Leukocytes,Ua: NEGATIVE
Nitrite: NEGATIVE
Protein, ur: NEGATIVE mg/dL
Specific Gravity, Urine: 1.015 (ref 1.005–1.030)
pH: 6 (ref 5.0–8.0)

## 2023-11-04 LAB — RENAL FUNCTION PANEL
Albumin: 1.5 g/dL — ABNORMAL LOW (ref 3.5–5.0)
Anion gap: 7 (ref 5–15)
BUN: <5 mg/dL — ABNORMAL LOW (ref 6–20)
CO2: 30 mmol/L (ref 22–32)
Calcium: 7.2 mg/dL — ABNORMAL LOW (ref 8.9–10.3)
Chloride: 102 mmol/L (ref 98–111)
Creatinine, Ser: 0.46 mg/dL (ref 0.44–1.00)
GFR, Estimated: >60 mL/min (ref >60)
Glucose, Bld: 87 mg/dL (ref 70–99)
Phosphorus: 3.5 mg/dL (ref 2.5–4.6)
Potassium: 3 mmol/L — ABNORMAL LOW (ref 3.5–5.1)
Sodium: 139 mmol/L (ref 135–145)

## 2023-11-04 LAB — MAGNESIUM: Magnesium: 1.9 mg/dL (ref 1.7–2.4)

## 2023-11-04 MED ORDER — MAGNESIUM SULFATE 2 GM/50ML IV SOLN
2.0000 g | Freq: Once | INTRAVENOUS | Status: AC
  Administered 2023-11-04: 2 g via INTRAVENOUS
  Filled 2023-11-04: qty 50

## 2023-11-04 MED ORDER — POTASSIUM CHLORIDE CRYS ER 20 MEQ PO TBCR
40.0000 meq | EXTENDED_RELEASE_TABLET | ORAL | Status: AC
  Administered 2023-11-04 (×2): 40 meq via ORAL
  Filled 2023-11-04 (×2): qty 2

## 2023-11-04 MED ORDER — AMOXICILLIN-POT CLAVULANATE 875-125 MG PO TABS
1.0000 | ORAL_TABLET | Freq: Two times a day (BID) | ORAL | Status: DC
  Administered 2023-11-05: 1 via ORAL
  Filled 2023-11-04: qty 1

## 2023-11-04 MED ORDER — ENSURE ENLIVE PO LIQD: 237.0000 mL | Freq: Two times a day (BID) | ORAL | Status: DC

## 2023-11-04 MED ORDER — HYDROMORPHONE HCL 1 MG/ML IJ SOLN
0.5000 mg | INTRAMUSCULAR | Status: DC | PRN
  Administered 2023-11-04 – 2023-11-05 (×2): 0.5 mg via INTRAVENOUS
  Filled 2023-11-04 (×2): qty 0.5

## 2023-11-04 NOTE — Progress Notes (Signed)
 Progress Note    Abigail [name] [facility]  LOV:[id number] DOB: 03/18/1966  DOA: 11/02/2023 PCP: Alease Medina, MD      Brief Narrative:    Medical records reviewed and are as summarized below:  [name] is a 58 y.o. female with medical history significant for chronic pancreatitis and pancreatic pseudocyst, chronic back pain pain on opiates with opioid-induced constipation on multiple laxatives, seizure disorder, COPD, chronic hypoxic respiratory failure on prn O2, history of giant cell arteritis no longer on steroids, depression, hypothyroidism, recent discharge from the hospital on 10/26/2023 after hospitalization for back pain, diarrhea and hypokalemia.  She presented to the hospital with cough and abdominal pain.  Oxygen saturation was 84% when EMS picked her up.  She was subsequently placed on 2 L of oxygen.  CT abdomen and pelvis showed aspiration or pneumonia in the lower lungs, acute on chronic pancreatitis with multiple pseudocysts, similar mild colitis, marked hepatic steatosis and aortic atherosclerosis.     Assessment/Plan:   Principal Problem:   Aspiration pneumonia (HCC) Active Problems:   Abdominal pain, acute on chronic   Acute respiratory failure with hypoxia (HCC)   Chronic prescription opiate use - thru [facility] in [place], [place]   Pancreatic pseudocyst   Hypokalemia   Chronic pain   Drug-induced constipation   Seizure disorder (HCC)   Hypothyroidism    Body mass index is 19.77 kg/m.   Bilateral lower lobe pneumonia, probable aspiration pneumonia: Discontinue IV ceftriaxone and azithromycin.  Start Augmentin tomorrow.   Diet advanced to regular diet.   Acute on chronic hypoxic respiratory failure: She is on 2 L/min oxygen via [place].  She uses 2 L/min oxygen at nighttime and with activity at home.  She does not use it 24/7 at home.   Chronic pancreatitis, pancreatic pseudocysts, abdominal pain: Lipase level was normal.  No acute  pancreatitis at this time.  Analgesics as needed for pain. Mild colitis on CT abdomen pelvis.  Recently discharged from the hospital on 11/05/2023 after hospitalization for diarrhea.  No acute diarrhea at this time.   Hypokalemia: Potassium up from 2.5-3.  Continue potassium repletion..  She may have to be discharged on potassium supplement Hypomagnesemia: Improved.  Continue magnesium repletion. Hypophosphatemia: Improved   Seizure disorder: Continue home antiepileptics   General Weakness: Home health PT was recommended. Hypoalbuminemia: Albumin is 1.5.  Encourage adequate oral intake   Comorbidities include depression, opioid-induced constipation, chronic back pain,   Diet Order             Diet regular Fluid consistency: Thin  Diet effective now                            Consultants: None  Procedures: None    Medications:    [START ON 11/05/2023] amoxicillin-clavulanate  1 tablet Oral Q12H   carbamazepine  100 mg Oral BID   DULoxetine  90 mg Oral Daily   enoxaparin (LOVENOX) injection  40 mg Subcutaneous Q24H   feeding supplement  237 mL Oral BID BM   gabapentin  300 mg Oral QID   guaiFENesin  600 mg Oral BID   lamoTRIgine  50 mg Oral Daily   Followed by   Melene Muller ON 11/08/2023] lamoTRIgine  50 mg Oral BID   Followed by   Melene Muller ON 11/22/2023] lamoTRIgine  100 mg Oral BID   levETIRAcetam  500 mg Oral BID   mirtazapine  30 mg  Oral QHS   nicotine  14 mg Transdermal Daily   pantoprazole  40 mg Oral Daily   polyethylene glycol  17 g Oral Daily   senna-docusate  1 tablet Oral BID   Continuous Infusions:     Anti-infectives (From admission, onward)    Start     Dose/Rate Route Frequency Ordered Stop   11/05/23 1000  amoxicillin-clavulanate (AUGMENTIN) 875-125 MG per tablet 1 tablet        1 tablet Oral Every 12 hours 11/04/23 0733 11/07/23 0959   11/03/23 0500  azithromycin (ZITHROMAX) 500 mg in sodium chloride 0.9 % 250 mL IVPB  Status:   Discontinued        500 mg 250 mL/hr over 60 Minutes Intravenous Every 24 hours 11/02/23 0456 11/04/23 0733   11/03/23 0400  cefTRIAXone (ROCEPHIN) 2 g in sodium chloride 0.9 % 100 mL IVPB  Status:  Discontinued        2 g 200 mL/hr over 30 Minutes Intravenous Every 24 hours 11/02/23 0456 11/04/23 0733   11/02/23 0530  cefTRIAXone (ROCEPHIN) 1 g in sodium chloride 0.9 % 100 mL IVPB        1 g 200 mL/hr over 30 Minutes Intravenous  Once 11/02/23 0528 11/02/23 0638   11/02/23 0415  cefTRIAXone (ROCEPHIN) 1 g in sodium chloride 0.9 % 100 mL IVPB        1 g 200 mL/hr over 30 Minutes Intravenous  Once 11/02/23 0410 11/02/23 0452   11/02/23 0415  azithromycin (ZITHROMAX) 500 mg in sodium chloride 0.9 % 250 mL IVPB        500 mg 250 mL/hr over 60 Minutes Intravenous  Once 11/02/23 0410 11/02/23 0605              Family Communication/Anticipated D/C date and plan/Code Status   DVT prophylaxis: enoxaparin (LOVENOX) injection 40 mg Start: 11/02/23 0800     Code Status: Full Code  Family Communication: None Disposition Plan: Plan to discharge home   Status is: Observation The patient will require care spanning > 2 midnights and should be moved to inpatient because: Pneumonia, abnormal electrolytes       Subjective:   Interval events noted.  She complains of left lower chest and rib pain.  No shortness of breath.  Right-sided abdominal pain is better.  Objective:    Vitals:   11/03/23 1645 11/03/23 2143 11/04/23 0525 11/04/23 0820  BP: (!) 110/50 (!) 111/59 127/80 126/64  Pulse: 85 87 98 74  Resp: 18 18 16 18   Temp: 97.9 F (36.6 C) 98.4 F (36.9 C) 97.7 F (36.5 C) 97.8 F (36.6 C)  TempSrc:      SpO2: 95% 96% 96% 91%  Weight:      Height:       No data found.   Intake/Output Summary (Last 24 hours) at 11/04/2023 1227 Last data filed at 11/04/2023 1117 Gross per 24 hour  Intake 1547.03 ml  Output --  Net 1547.03 ml   Filed Weights   11/02/23 0050   Weight: 59 kg    Exam:  GEN: NAD SKIN: Warm and dry EYES: No pallor or icterus ENT: MMM CV: RRR PULM: CTA B ABD: soft, ND, NT, +BS CNS: AAO x 3, non focal EXT: No edema or tenderness        Data Reviewed:   I have personally reviewed following labs and imaging studies:  Labs: Labs show the following:   Basic Metabolic Panel: Recent Labs  Lab 11/02/23  0111 11/03/23 0539 11/04/23 0215  NA 136 136 139  K 3.0* 2.5* 3.0*  CL 98 100 102  CO2 29 31 30   GLUCOSE 105* 80 87  BUN <5* <5* <5*  CREATININE 0.50 0.41* 0.46  CALCIUM 7.4* 7.0* 7.2*  MG 1.7 1.6* 1.9  PHOS  --  2.2* 3.5   GFR Estimated Creatinine Clearance: 71.4 mL/min (by C-G formula based on SCr of 0.46 mg/dL). Liver Function Tests: Recent Labs  Lab 11/02/23 0111 11/04/23 0215  AST 22  --   ALT 15  --   ALKPHOS 107  --   BILITOT 1.1  --   PROT 6.3*  --   ALBUMIN 1.9* 1.5*   Recent Labs  Lab 11/02/23 0111  LIPASE 25   No results for input(s): "AMMONIA" in the last 168 hours. Coagulation profile No results for input(s): "INR", "PROTIME" in the last 168 hours.  CBC: Recent Labs  Lab 11/02/23 0111 11/03/23 0539  WBC 9.4 4.6  NEUTROABS 7.6  --   HGB 11.2* 9.3*  HCT 35.1* 29.1*  MCV 94.4 94.2  PLT 288 223   Cardiac Enzymes: No results for input(s): "CKTOTAL", "CKMB", "CKMBINDEX", "TROPONINI" in the last 168 hours. BNP (last 3 results) No results for input(s): "PROBNP" in the last 8760 hours. CBG: No results for input(s): "GLUCAP" in the last 168 hours. D-Dimer: No results for input(s): "DDIMER" in the last 72 hours. Hgb A1c: No results for input(s): "HGBA1C" in the last 72 hours. Lipid Profile: No results for input(s): "CHOL", "HDL", "LDLCALC", "TRIG", "CHOLHDL", "LDLDIRECT" in the last 72 hours. Thyroid function studies: No results for input(s): "TSH", "T4TOTAL", "T3FREE", "THYROIDAB" in the last 72 hours.  Invalid input(s): "FREET3" Anemia work up: Recent Labs     11/03/23 0539 11/03/23 0813  VITAMINB12  --  1,153*  FERRITIN 149  --   TIBC 126*  --   IRON 101  --    Sepsis Labs: Recent Labs  Lab 11/02/23 0104 11/02/23 0111 11/03/23 0539  WBC  --  9.4 4.6  LATICACIDVEN 1.4  --   --     Microbiology No results found for this or any previous visit (from the past 240 hours).  Procedures and diagnostic studies:  No results found.              LOS: 0 days   Tyreisha Ungar  [facility]   Pager on [email or web address]. If 7PM-7AM, please contact night-coverage at [email or web address]     11/04/2023, 12:27 PM

## 2023-11-04 NOTE — Progress Notes (Signed)
 Speech Language Pathology Treatment: Dysphagia  Patient Details Name: Abigail [name] MRN: [id number] DOB: 01/14/1966 Today's Date: 11/04/2023 Time: 8250-5397 SLP Time Calculation (min) (ACUTE ONLY): 25 min  Assessment / Plan / Recommendation Clinical Impression  Pt seen for skilled ST intervention targeting training for aspiration precautions and trials of advanced solids. Pt is afebrile with WBC WNL. Pt seen with thin liquids (via cup and straw) and regular solids. Single congested cough noted- not directly timed with PO. Pt reporting intermittent congested cough t/o hospitalization. Pt demonstrating efficient and complete oral manipulation and clearance with regular solids. Education shared regarding aspiration precautions and rationale for precautions in the setting of current PNA/recent nausea/vomiting. Pt reported understanding. Recommend advancement to regular solids and thin liquids with aspiration precautions (elevated HOB during and after intake, slow rate, small bites, and alert for PO intake). Pt remains at risk for aspiration in the setting of deconditioning, recent/recurrent nausea/vomiting, and challenge with positioning (pt with limited tolerance for upright positioning in bed secondary to abdominal pain). RN made aware of diet recommendations. No further acute SLP services indicated at this time.     HPI HPI: Abigail [name] is a 58 y.o. female with medical history significant for Chronic pancreatitis and pancreatic pseudocyst, chronic pain on opiates with opioid-induced constipation on multiple laxatives, seizure disorder, COPD on prn O2, history of giant cell arteritis no longer on steroids, depression, hypothyroidism, being admitted with multifocal pneumonia, abdominal pain and hypoxia. Pt recently seen by SLP from 10/09/23-10/10/23, with recommendations for regular solids and thin liquids by time of discharge. DG Chest 11/02/23: 1. No change in bilateral streaky  pulmonary opacities with a lower lung zone predominance. Findings may reflect atelectasis and/or pneumonia. Low lung volumes.      SLP Plan  All goals met      Recommendations for follow up therapy are one component of a multi-disciplinary discharge planning process, led by the attending physician.  Recommendations may be updated based on patient status, additional functional criteria and insurance authorization.    Recommendations  Diet recommendations: Regular;Thin liquid Liquids provided via: Straw;Cup Medication Administration: Whole meds with puree (vs liquid- pt reports challenge with taking large pills) Supervision: Patient able to self feed Compensations: Minimize environmental distractions;Slow rate;Small sips/bites Postural Changes and/or Swallow Maneuvers: Seated upright 90 degrees;Upright 30-60 min after meal (reflux precautions)                  Oral care BID   None Dysphagia, oropharyngeal phase (R13.12)     All goals met    [place] Daffney Greenly Clapp, MS, CCC-SLP Speech Language Pathologist Rehab Services; [facility] [phone] (ascom)   [place] J Clapp  11/04/2023, 11:23 AM

## 2023-11-04 NOTE — TOC Progression Note (Addendum)
 Transition of Care [facility]) - Progression Note    Patient Details  Name: Abigail [name] [facility] MRN: [id number] Date of Birth: Jul 14, 1966  Transition of Care [facility] CM/SW Contact  Truddie Hidden, RN Phone Number: 11/04/2023, 3:39 PM  Clinical Narrative:    Spoke with patient regarding therapy's recommendation for [facility]. Patient stated prior to admission she was seen by [facility]. Patient is agreeable to [facility] and would like to stay with [facility]. Her mother will transport her home. Patient advised she will have to have her mother bring her home oxygen tanks for discharge. She was also advised [facility] will call her at discharge to schedule her appointment for resumption of care.   Rhonda from [facility] notified of patient's admission she confirmed patient is acitve for HHPT/OT. Resumption orders requested          Expected Discharge Plan and Services         Expected Discharge Date: 11/04/23                                     Social Determinants of Health (SDOH) Interventions SDOH Screenings   Food Insecurity: No Food Insecurity (11/02/2023)  Recent Concern: Food Insecurity - Food Insecurity Present (10/23/2023)  Housing: Low Risk  (11/02/2023)  Recent Concern: Housing - High Risk (10/23/2023)  Transportation Needs: No Transportation Needs (11/02/2023)  Recent Concern: Transportation Needs - Unmet Transportation Needs (10/23/2023)  Utilities: Not At Risk (11/02/2023)  Depression (PHQ2-9): High Risk (09/18/2023)  Social Connections: Unknown (11/02/2023)  Recent Concern: Social Connections - Socially Isolated (10/09/2023)  Tobacco Use: High Risk (10/23/2023)    Readmission Risk Interventions    09/26/2023    4:06 PM 07/31/2023    1:07 PM  Readmission Risk Prevention Plan  Transportation Screening Complete Complete  Medication Review Oceanographer) Complete Complete  PCP or Specialist appointment within 3-5 days of discharge  Complete  HRI or Home Care Consult  Complete  SW  Recovery Care/Counseling Consult Complete Complete  Palliative Care Screening Not Applicable Not Applicable  Skilled Nursing Facility Not Applicable Not Applicable

## 2023-11-04 NOTE — Plan of Care (Signed)

## 2023-11-05 DIAGNOSIS — J690 Pneumonitis due to inhalation of food and vomit: Secondary | ICD-10-CM | POA: Diagnosis not present

## 2023-11-05 LAB — CBC
HCT: 33.9 % — ABNORMAL LOW (ref 36.0–46.0)
Hemoglobin: 10.5 g/dL — ABNORMAL LOW (ref 12.0–15.0)
MCH: 29.6 pg (ref 26.0–34.0)
MCHC: 31 g/dL (ref 30.0–36.0)
MCV: 95.5 fL (ref 80.0–100.0)
Platelets: 276 10*3/uL (ref 150–400)
RBC: 3.55 MIL/uL — ABNORMAL LOW (ref 3.87–5.11)
RDW: 15.5 % (ref 11.5–15.5)
WBC: 7.5 10*3/uL (ref 4.0–10.5)
nRBC: 0 % (ref 0.0–0.2)

## 2023-11-05 LAB — MAGNESIUM: Magnesium: 2 mg/dL (ref 1.7–2.4)

## 2023-11-05 LAB — POTASSIUM: Potassium: 3.9 mmol/L (ref 3.5–5.1)

## 2023-11-05 MED ORDER — MORPHINE SULFATE 15 MG PO TABS: 15.0000 mg | ORAL_TABLET | Freq: Three times a day (TID) | ORAL | Status: DC | PRN

## 2023-11-05 MED ORDER — POTASSIUM CHLORIDE CRYS ER 10 MEQ PO TBCR: 10.0000 meq | EXTENDED_RELEASE_TABLET | Freq: Every day | ORAL | Status: DC

## 2023-11-05 MED ORDER — POTASSIUM CHLORIDE CRYS ER 10 MEQ PO TBCR: 10.0000 meq | EXTENDED_RELEASE_TABLET | Freq: Every day | ORAL | 0 refills | Status: DC

## 2023-11-05 MED ORDER — AMOXICILLIN-POT CLAVULANATE 875-125 MG PO TABS: 1.0000 | ORAL_TABLET | Freq: Two times a day (BID) | ORAL | 0 refills | Status: AC

## 2023-11-05 NOTE — Discharge Summary (Signed)
 Physician Discharge Summary   Patient: Abigail [name] - [facility] MRN: [id number] DOB: 09/28/1965  Admit date:     11/02/2023  Discharge date: 11/05/23  Discharge Physician: Lurene Shadow   PCP: Alease Medina, MD   Recommendations at discharge:   Follow-up with PCP in 1 week  Discharge Diagnoses: Principal Problem:   Aspiration pneumonia (HCC) Active Problems:   Abdominal pain, acute on chronic   Acute respiratory failure with hypoxia (HCC)   Chronic prescription opiate use - thru [facility] in [place], [place]   Pancreatic pseudocyst   Hypokalemia   Chronic pain   Drug-induced constipation   Seizure disorder (HCC)   Hypothyroidism  Resolved Problems:   * No resolved hospital problems. *  Hospital Course:  [facility] is a 58 y.o. female with medical history significant for chronic pancreatitis and pancreatic pseudocyst, chronic back pain pain on opiates with opioid-induced constipation on multiple laxatives, seizure disorder, COPD, chronic hypoxic respiratory failure on prn O2, history of giant cell arteritis no longer on steroids, depression, hypothyroidism, recent discharge from the hospital on 10/26/2023 after hospitalization for back pain, diarrhea and hypokalemia.  She presented to the hospital with cough and abdominal pain.  Oxygen saturation was 84% when EMS picked her up.  She was subsequently placed on 2 L of oxygen.   CT abdomen and pelvis showed aspiration or pneumonia in the lower lungs, acute on chronic pancreatitis with multiple pseudocysts, similar mild colitis, marked hepatic steatosis and aortic atherosclerosis.    Assessment and Plan:   Bilateral lower lobe pneumonia, probable aspiration pneumonia: Continue Augmentin for 2 more days. Previously treated with 3 days of IV ceftriaxone and azithromycin.   Acute on chronic hypoxic respiratory failure: Improved.  She is tolerating room air.    She uses 2 L/min oxygen at nighttime and with activity at  home.  She does not use it 24/7 at home.     Chronic pancreatitis, pancreatic pseudocysts, abdominal pain: Improved.  Lipase level was normal.  No acute pancreatitis at this time.  Analgesics as needed for pain. Mild colitis on CT abdomen pelvis.  Recently discharged from the hospital on 11/05/2023 after hospitalization for diarrhea.  No acute diarrhea at this time.     Hypokalemia: Improved.  Recommended regular potassium supplementation for now.  She will be discharged on potassium chloride 10 mEq daily.  Close follow-up of BMP/electrolytes recommended for further management.   Hypomagnesemia: Improved.  Continue magnesium repletion. Hypophosphatemia: Improved     Seizure disorder: Continue home antiepileptics     General Weakness: Home health PT and OT at discharge Hypoalbuminemia: Albumin is 1.5.  Encouraged adequate oral intake including use of nutritional supplements.     Comorbidities include depression, opioid-induced constipation, chronic back pain,    Her condition has improved and she is deemed stable for discharge to home today.  Discharge plan was discussed with the patient and her mother at the bedside.      Consultants: None Procedures performed: None Disposition: Home health Diet recommendation:  Discharge Diet Orders (From admission, onward)     Start     Ordered   11/05/23 0000  Diet - low sodium heart healthy        11/05/23 1109           Cardiac diet DISCHARGE MEDICATION: Allergies as of 11/05/2023       Reactions   Nsaids Hives   Tapentadol Swelling, Rash, Other (See Comments)   Nucynta- Made her deathly sick  Cephalexin Other (See Comments)   Reaction not cited   Codeine Other (See Comments)   Reaction not cited   Darvocet [propoxyphene N-acetaminophen] Other (See Comments)   Reaction not cited   Latex Other (See Comments)   Reaction not cited   Silicone Other (See Comments)   Reaction not cited   Sulfa Antibiotics Hives   Tape Other  (See Comments)   Reaction not cited   Meloxicam Rash        Medication List     STOP taking these medications    feeding supplement Liqd   polyethylene glycol powder 17 GM/SCOOP powder Commonly known as: GLYCOLAX/MIRALAX   Stimulant Laxative 8.6-50 MG tablet Generic drug: senna-docusate       TAKE these medications    amoxicillin-clavulanate 875-125 MG tablet Commonly known as: AUGMENTIN Take 1 tablet by mouth every 12 (twelve) hours for 3 doses.   bisacodyl 5 MG EC tablet Generic drug: bisacodyl Take 1 tablet (5 mg total) by mouth daily as needed for moderate constipation.   carbamazepine 100 MG 12 hr tablet Commonly known as: TEGRETOL XR Take 1 tablet (100 mg total) by mouth 2 (two) times daily.   DULoxetine 30 MG capsule Commonly known as: CYMBALTA Take 3 capsules (90 mg total) by mouth daily.   gabapentin 300 MG capsule Commonly known as: NEURONTIN Take 1 capsule (300 mg total) by mouth 4 (four) times daily.   lamoTRIgine 25 MG tablet Commonly known as: LAMICTAL Take 2 tablets (50 mg total) by mouth daily for 12 days, THEN 2 tablets (50 mg total) 2 (two) times daily for 14 days, THEN 4 tablets (100 mg total) 2 (two) times daily. Start taking on: October 27, 2023   levETIRAcetam 500 MG tablet Commonly known as: KEPPRA Take 500 mg by mouth 2 (two) times daily.   Melatonin 10 MG Tabs Take 10 mg by mouth at bedtime.   mirtazapine 30 MG tablet Commonly known as: REMERON Take 1 tablet (30 mg total) by mouth at bedtime.   morphine 15 MG tablet Commonly known as: MSIR Take 1 tablet (15 mg total) by mouth every 8 (eight) hours as needed for severe pain (pain score 7-10).   NON FORMULARY Apply 1 Application topically 3 (three) times daily as needed. Rock Salt Cream (Similar to [employer])   ondansetron 8 MG disintegrating tablet Commonly known as: ZOFRAN-ODT Take 1 tablet (8 mg total) by mouth every 8 (eight) hours as needed for nausea.   pantoprazole 40 MG  tablet Commonly known as: PROTONIX Take 40 mg by mouth daily.   potassium chloride 10 MEQ tablet Commonly known as: KLOR-CON M Take 1 tablet (10 mEq total) by mouth daily. Start taking on: November 06, 2023        Follow-up Information     [name], [name], MD. Schedule an appointment as soon as possible for a visit .   Specialty: Family Medicine Contact information: [address] [place] [address] [phone]                Discharge Exam: Ceasar Mons Weights   11/02/23 0050  Weight: 59 kg   GEN: NAD SKIN: Warm and dry EYES: No pallor or icterus ENT: MMM CV: RRR PULM: CTA B ABD: soft, ND, NT, +BS CNS: AAO x 3, non focal EXT: No edema or tenderness   Condition at discharge: good  The results of significant diagnostics from this hospitalization (including imaging, microbiology, ancillary and laboratory) are listed below for reference.  Imaging Studies: DG Chest Port 1 View Result Date: 11/02/2023 CLINICAL DATA:  Evaluate for pneumonia. EXAM: PORTABLE CHEST 1 VIEW COMPARISON:  10/23/2023 FINDINGS: Stable cardiomediastinal contours. Lung volumes are low. Asymmetric elevation of the right hemidiaphragm. Bilateral streaky pulmonary opacities are identified with a lower lung zone predominance. Unchanged compared with the previous exam. Pulmonary vascular congestion also unchanged. No new findings. IMPRESSION: 1. No change in bilateral streaky pulmonary opacities with a lower lung zone predominance. Findings may reflect atelectasis and/or pneumonia. 2. Low lung volumes. Electronically Signed   By: Signa Kell M.D.   On: 11/02/2023 05:16   CT ABDOMEN PELVIS W CONTRAST Result Date: 11/02/2023 CLINICAL DATA:  Right lower quadrant abdominal pain with nausea EXAM: CT ABDOMEN AND PELVIS WITH CONTRAST TECHNIQUE: Multidetector CT imaging of the abdomen and pelvis was performed using the standard protocol following bolus administration of intravenous contrast. RADIATION DOSE  REDUCTION: This exam was performed according to the departmental dose-optimization program which includes automated exposure control, adjustment of the mA and/or kV according to patient size and/or use of iterative reconstruction technique. CONTRAST:  OMNIPAQUE IOHEXOL 300 MG/ML  SOLN COMPARISON:  CT abdomen pelvis 09/24/2023 FINDINGS: Lower chest: Patchy airspace opacities in the right middle lobe and bilateral lower lobes compatible with multifocal pneumonia. Hepatobiliary: Marked hepatic steatosis. Increased size of the subcapsular fluid collection along the anterior left hepatic lobe now measuring 5.1 x 2.7 cm. Adjacent hyperemic liver parenchyma. Pericholecystic fluid about the gallbladder fundus. No biliary dilation. Pancreas: Similar pancreatic atrophy. Large loculated thick-walled cystic structure in the pancreatic head has decreased in size now measuring 4.3 cm, previously 7.6 cm. This likely represents a chronic pseudocyst. New bilobed pseudocyst in the pancreatic body measuring 2.6 cm. Decreased size of the pseudocyst if the body/tail junction now measuring 1.9 cm, previously 2.8 cm. Decreased size of the pseudocyst in the pancreatic tail now measuring 5.4 cm, previously 7.8 cm. This fluid collection extends between the diaphragm and superior spleen. The previous collection in the gastrohepatic ligament has nearly resolved now measuring 2.4 cm, previously 7.6 cm. Similar stranding and fluid about the pseudocyst in the pancreatic head. Spleen: Unremarkable. Adrenals/Urinary Tract: Stable adrenal glands. No urinary calculi or hydronephrosis. Unremarkable bladder. Stomach/Bowel: Stomach is within normal limits. Reactive wall thickening about the duodenum. No bowel obstruction. Scattered areas of wall thickening throughout the colon similar to prior. Vascular/Lymphatic: Aortic atherosclerosis. No enlarged abdominal or pelvic lymph nodes. Patent portal vein. Reproductive: Unremarkable. Other: No free  intraperitoneal air. Musculoskeletal: Right THA. Anterior fusion L4-S1. Chronic compression fractures of multiple thoracic and lumbar vertebral bodies. IMPRESSION: 1. Aspiration or pneumonia in the lower lungs. 2. Acute on chronic pancreatitis with multiple pseudocysts. Some of the pseudocyst have increased in size while others have decreased. 3. Similar mild colitis. 4. Marked hepatic steatosis. 5. Aortic Atherosclerosis (ICD10-I70.0). Electronically Signed   By: Minerva Fester M.D.   On: 11/02/2023 03:29   CT LUMBAR SPINE WO CONTRAST Result Date: 10/23/2023 CLINICAL DATA:  Low back pain.  Increased fracture risk. EXAM: CT LUMBAR SPINE WITHOUT CONTRAST TECHNIQUE: Multidetector CT imaging of the lumbar spine was performed without intravenous contrast administration. Multiplanar CT image reconstructions were also generated. RADIATION DOSE REDUCTION: This exam was performed according to the departmental dose-optimization program which includes automated exposure control, adjustment of the mA and/or kV according to patient size and/or use of iterative reconstruction technique. COMPARISON:  11/29/2011 lumbar CT. Abdomen pelvis CT 09/24/2023. Abdomen CT 07/29/2023. Abdomen CT 12/14/2022. FINDINGS: Segmentation: 5 lumbar type vertebral  bodies. Alignment: S shaped scoliotic curvature of the thoracolumbar spine. Vertebrae: Distant discectomy and fusion from L4 to the sacrum with solid union. No evidence of hardware complication. Compression deformity of the L1 vertebral body with loss of height anteriorly of 40%. Mild posterior bowing of the posterosuperior margin of the vertebral body. No apparent progression with compared to the CT study of 1 month ago. Degenerative endplate changes of L2, similar to the prior study. Paraspinal and other soft tissues: No evidence of paravertebral inflammatory changes. Aortic atherosclerosis incidentally noted. Moderate amount of free fluid in the pelvis, etiology not specifically  demonstrated on this study. On the previous abdominal CT, this was felt likely secondary to pancreatitis. Disc levels: T12-L1: Mild bulging of the disc. Posterior bowing of the posterosuperior margin of the L1 vertebral body. No apparent neural compression. L1-2: Disc degeneration with endplate irregularity, similar to the study of 1 month ago, therefore much more likely degenerative than inflammatory. Bulging of the disc. Facet degeneration and hypertrophy worse on the left. Narrowing of the left lateral recess and intervertebral foramen on the left that could possibly cause neural compression. L2-3: Disc degeneration with vacuum phenomenon. Endplate osteophytes and bulging of the disc. Facet and ligamentous hypertrophy. Moderate stenosis of the canal, lateral recesses and neural foramina which could possibly cause neural compression. L3-4: Bulging of the disc. Facet and ligamentous hypertrophy worse on the right. Stenosis of the lateral recesses and foramina right more than left. Some potential for neural compression on the right. L4 to sacrum: Previous fusion procedure. Solid union with wide patency of the canal and foramina. No evidence of sacral fracture IMPRESSION: 1. Distant discectomy and fusion from L4 to the sacrum with solid union. No evidence of hardware complication. 2. Compression deformity of the L1 vertebral body with loss of height anteriorly of 40%. Mild posterior bowing of the posterosuperior margin of the vertebral body. No apparent progression with compared to the CT study of 1 month ago or even when compared to the study of April 2024. 3. L1-2: Disc degeneration with endplate irregularity, similar to the study of 1 month ago, therefore much more likely degenerative than inflammatory. Bulging of the disc. Facet degeneration and hypertrophy worse on the left. Narrowing of the left lateral recess and intervertebral foramen on the left that could possibly cause neural compression. 4. L2-3: Disc  degeneration with vacuum phenomenon. Endplate osteophytes and bulging of the disc. Facet and ligamentous hypertrophy. Moderate stenosis of the canal, lateral recesses and neural foramina which could possibly cause neural compression. 5. L3-4: Bulging of the disc. Facet and ligamentous hypertrophy worse on the right. Stenosis of the lateral recesses and foramina right more than left. Some potential for neural compression on the right. 6. Moderate amount of free fluid in the pelvis, etiology not specifically demonstrated on this study. On the previous abdominal CT, this was felt likely secondary to pancreatitis. Aortic Atherosclerosis (ICD10-I70.0). Electronically Signed   By: Paulina Fusi M.D.   On: 10/23/2023 16:41   DG Chest Port 1 View Result Date: 10/23/2023 CLINICAL DATA:  Shortness of breath. EXAM: PORTABLE CHEST 1 VIEW COMPARISON:  October 08, 2023. FINDINGS: The heart size and mediastinal contours are within normal limits. Hypoinflation of the lungs is noted. Mild bibasilar subsegmental atelectasis is noted. The visualized skeletal structures are unremarkable. IMPRESSION: Hypoinflation of the lungs with mild bibasilar subsegmental atelectasis. Electronically Signed   By: Lupita Raider M.D.   On: 10/23/2023 14:31   DG Abdomen 1 View Result Date:  10/08/2023 CLINICAL DATA:  NG tube placement EXAM: ABDOMEN - 1 VIEW COMPARISON:  CT 09/24/2023 FINDINGS: Enteric tube tip overlies the mid stomach. Upper gas pattern is unremarkable. Excreted contrast in the renal collecting systems. Bibasilar airspace disease and probable pleural effusions IMPRESSION: Enteric tube tip overlies the mid stomach. Electronically Signed   By: Jasmine Pang M.D.   On: 10/08/2023 17:57   DG Chest Portable 1 View Result Date: 10/08/2023 CLINICAL DATA:  eval for aspiration Seizure. EXAM: PORTABLE CHEST 1 VIEW COMPARISON:  Radiograph 07/14/2023, CT 07/15/2023 FINDINGS: Low lung volumes and anti lordotic positioning limit assessment.  Bibasilar volume loss with streaky bibasilar opacities, favor atelectasis. Stable heart size and mediastinal contours allowing for differences in technique. No pneumothorax. Difficult to assess for effusion on the current exam. IMPRESSION: Very low lung volumes limiting assessment. Streaky bibasilar opacities are similar to prior exam, favor atelectasis. Electronically Signed   By: Narda Rutherford M.D.   On: 10/08/2023 15:25   EEG adult Result Date: 10/08/2023 [name], MD     10/08/2023 12:19 PM Patient Name: Hatice Bubel [facility] MRN: [id number] Epilepsy Attending: [name] Referring Physician/Provider: Caryl Pina, MD Date: 10/08/2023 Duration: 34.42 mins Patient history:  58 yo F brought in after being found seizing by her son at home at about 5 AM this morning. EEG to evaluate for seizure Level of alertness: Awake, asleep AEDs during EEG study: LEV, CBZ Technical aspects: This EEG study was done with scalp electrodes positioned according to the 10-20 International system of electrode placement. Electrical activity was reviewed with band pass filter of 1-70Hz , sensitivity of 7 uV/mm, display speed of 34mm/sec with a 60Hz  notched filter applied as appropriate. EEG data were recorded continuously and digitally stored.  Video monitoring was available and reviewed as appropriate. Description: The posterior dominant rhythm consists of 7 Hz activity of moderate voltage (25-35 uV) seen predominantly in posterior head regions, symmetric and reactive to eye opening and eye closing. Sleep was characterized by vertex waves, sleep spindles (12 to 14 Hz), maximal frontocentral region. EEG showed continuous generalized polymorphic sharply contoured 5 to 7 Hz theta slowing. Hyperventilation and photic stimulation were not performed.   ABNORMALITY - Continuous slow, generalized IMPRESSION: This study is suggestive of mild to moderate diffuse encephalopathy. No seizures or epileptiform discharges were  seen throughout the recording. [name]   CT ANGIO HEAD NECK W WO CM W PERF Result Date: 10/08/2023 CLINICAL DATA:  Provided history: Neuro deficit, acute, stroke suspected. Additional history provided: Seizure with subsequent dense left hemiparesis. EXAM: CT ANGIOGRAPHY HEAD AND NECK CT PERFUSION BRAIN TECHNIQUE: Multidetector CT imaging of the head and neck was performed using the standard protocol during bolus administration of intravenous contrast. Multiplanar CT image reconstructions and MIPs were obtained to evaluate the vascular anatomy. Carotid stenosis measurements (when applicable) are obtained utilizing NASCET criteria, using the distal internal carotid diameter as the denominator. Multiphase CT imaging of the brain was performed following IV bolus contrast injection. Subsequent parametric perfusion maps were calculated using RAPID software. RADIATION DOSE REDUCTION: This exam was performed according to the departmental dose-optimization program which includes automated exposure control, adjustment of the mA and/or kV according to patient size and/or use of iterative reconstruction technique. CONTRAST:  OMNIPAQUE IOHEXOL 350 MG/ML SOLN COMPARISON:  Non-contrast head CT performed earlier today 10/08/2023. FINDINGS: CTA NECK FINDINGS Aortic arch: Standard aortic branching. The visualized thoracic aorta is normal in caliber. No hemodynamically significant innominate or proximal subclavian artery stenosis.  Right carotid system: CCA and ICA patent within the neck without atherosclerotic stenosis. Mild atherosclerotic plaque about the carotid bifurcation and within the proximal ICA. A 50% narrowing of the proximal CCA appears to be related to vessel tortuosity rather than atherosclerotic plaque. Tortuosity of the mid to distal cervical ICA. No evidence of dissection. Left carotid system: CCA and ICA patent within the neck without stenosis. Mild atherosclerotic plaque at the carotid bifurcation.  No evidence of dissection. Vertebral arteries: Codominant and patent within the neck without stenosis. Skeleton: Levocurvature of the cervical spine. Partially imaged dextrocurvature of the mid/upper thoracic spine. Prior C5-C7 ACDF. Cervical spondylosis. No acute fracture or aggressive osseous lesion. Other neck: No neck mass or cervical lymphadenopathy. Upper chest: Motion degradation limits evaluation of the imaged lung apices. Atelectasis within the dependent aspect of the right upper lobe at the imaged levels. Review of the MIP images confirms the above findings CTA HEAD FINDINGS Anterior circulation: The intracranial internal carotid arteries are patent. Nonstenotic atherosclerotic plaque within the right ICA cavernous segment The M1 middle cerebral arteries are patent. No M2 proximal branch occlusion or high-grade proximal stenosis. The anterior cerebral arteries are patent. No intracranial aneurysm is identified. Posterior circulation: The intracranial vertebral arteries are patent. The basilar artery is patent. The posterior cerebral arteries are patent. A small right posterior communicating artery is present. The left posterior communicating artery is diminutive or absent. Venous sinuses: Within the limitations of contrast timing, no convincing thrombus. Anatomic variants: As described. Review of the MIP images confirms the above findings CT Brain Perfusion Findings: Poor quality of the source data/contrast bolus limits reliability of the perfusion software estimates. Within this limitation, findings are as follows. CBF (<30%) Volume: 0mL Perfusion (Tmax>6.0s) volume: 30mL (scattered within the bilateral frontal and temporal lobes). Mismatch Volume: 30mL Infarction Location:None identified. No emergent large vessel occlusion identified. These results were called by telephone at the time of interpretation on 10/08/2023 at 10:10 am to provider ERIC [facility] , who verbally acknowledged these results.  IMPRESSION: CTA neck: 1. The common carotid and internal carotid arteries are patent within the neck without neck significant atherosclerotic stenosis. Mild atherosclerotic plaque bilaterally, as described. 50% narrowing of the proximal right common carotid artery appears to be related to vessel tortuosity rather than atherosclerotic disease. 2. The vertebral arteries are patent within the neck without stenosis or significant atherosclerotic disease. CTA head: 1. No proximal intracranial large vessel occlusion or high-grade proximal arterial stenosis identified. 2. Non-stenotic atherosclerotic plaque within the right ICA cavernous segment. CT perfusion head: 1. Poor quality of the source data/contrast bolus limits reliability of the perfusion software estimates. Within this limitation, findings are as follows. 2. The perfusion software identifies no core infarct. The perfusion software identifies regions of hypoperfused parenchyma, totaling 30 mL, scattered within the bilateral frontal and temporal lobes (utilizing the Tmax>6 seconds threshold). Reported mismatch volume: 30 mL. Electronically Signed   By: Jackey Loge D.O.   On: 10/08/2023 10:36   CT Head Wo Contrast Result Date: 10/08/2023 CLINICAL DATA:  Status epilepticus. EXAM: CT HEAD WITHOUT CONTRAST TECHNIQUE: Contiguous axial images were obtained from the base of the skull through the vertex without intravenous contrast. RADIATION DOSE REDUCTION: This exam was performed according to the departmental dose-optimization program which includes automated exposure control, adjustment of the mA and/or kV according to patient size and/or use of iterative reconstruction technique. COMPARISON:  09/29/2023 FINDINGS: Brain: No evidence of intracranial hemorrhage, acute infarction, hydrocephalus, extra-axial collection, or mass lesion/mass effect. Vascular:  No hyperdense vessel or other acute findings. Skull: No evidence of fracture or other significant bone  abnormality. Sinuses/Orbits: No acute findings. Chronic opacification of left maxillary sinus again noted. Other: None. IMPRESSION: No acute intracranial abnormality. Chronic left maxillary sinus disease. Electronically Signed   By: Danae Orleans M.D.   On: 10/08/2023 08:15    Microbiology: Results for orders placed or performed during the hospital encounter of 10/23/23  Blood culture (routine x 2)     Status: None   Collection Time: 10/23/23 11:44 AM   Specimen: BLOOD  Result Value Ref Range Status   Specimen Description BLOOD RIGHT ARM  Final   Special Requests   Final    BOTTLES DRAWN AEROBIC AND ANAEROBIC Blood Culture results may not be optimal due to an inadequate volume of blood received in culture bottles   Culture   Final    NO GROWTH 5 DAYS Performed at [facility], [address] [place], [place] [address]    Report Status 10/28/2023 FINAL  Final  Blood culture (routine x 2)     Status: None   Collection Time: 10/23/23  5:39 PM   Specimen: BLOOD LEFT ARM  Result Value Ref Range Status   Specimen Description BLOOD LEFT ARM  Final   Special Requests   Final    BOTTLES DRAWN AEROBIC AND ANAEROBIC Blood Culture adequate volume   Culture   Final    NO GROWTH 5 DAYS Performed at [facility], [address] [place], [place] [address]    Report Status 10/28/2023 FINAL  Final    Labs: CBC: Recent Labs  Lab 11/02/23 0111 11/03/23 0539 11/05/23 0622  WBC 9.4 4.6 7.5  NEUTROABS 7.6  --   --   HGB 11.2* 9.3* 10.5*  HCT 35.1* 29.1* 33.9*  MCV 94.4 94.2 95.5  PLT 288 223 276   Basic Metabolic Panel: Recent Labs  Lab 11/02/23 0111 11/03/23 0539 11/04/23 0215 11/05/23 0622  NA 136 136 139  --   K 3.0* 2.5* 3.0* 3.9  CL 98 100 102  --   CO2 29 31 30   --   GLUCOSE 105* 80 87  --   BUN <5* <5* <5*  --   CREATININE 0.50 0.41* 0.46  --   CALCIUM 7.4* 7.0* 7.2*  --   MG 1.7 1.6* 1.9 2.0  PHOS  --  2.2* 3.5  --    Liver Function Tests: Recent  Labs  Lab 11/02/23 0111 11/04/23 0215  AST 22  --   ALT 15  --   ALKPHOS 107  --   BILITOT 1.1  --   PROT 6.3*  --   ALBUMIN 1.9* 1.5*   CBG: No results for input(s): "GLUCAP" in the last 168 hours.  Discharge time spent: greater than 30 minutes.  Signed: Lurene Shadow, MD [facility] 11/05/2023

## 2023-11-06 ENCOUNTER — Other Ambulatory Visit: Payer: Self-pay | Admitting: Family Medicine

## 2023-11-06 ENCOUNTER — Telehealth (HOSPITAL_BASED_OUTPATIENT_CLINIC_OR_DEPARTMENT_OTHER): Payer: Self-pay

## 2023-11-06 DIAGNOSIS — E876 Hypokalemia: Secondary | ICD-10-CM

## 2023-11-06 MED ORDER — POTASSIUM CHLORIDE CRYS ER 10 MEQ PO TBCR: 10.0000 meq | EXTENDED_RELEASE_TABLET | Freq: Every day | ORAL | 3 refills | Status: DC

## 2023-11-06 NOTE — Telephone Encounter (Signed)
-----   Message from Alease Medina sent at 11/06/2023 11:38 AM EDT ----- Regarding: RE: K+ She was hypokalemic with this admission and several other admissions.  She has a follow up appointment with me on 3/24.  I will write for potassium chloride and check her electrolytes when she comes in the office.  Thanks, Dr. Girtha Rm ----- Message ----- From: Shelbie Proctor, CMA Sent: 11/05/2023   1:10 PM EDT To: Alease Medina, MD Subject: K+                                             Pt has appt for next week. K+ was a 3.9 today. Please advise  Reason for CRM: [employer] called. States patient was released from Hospital and there were two issues with Post discharge medication reconciliation: 1.potassium chloride (KLOR-CON M) 10 reported but missing from discharge paper work - need confirmation. 2. Need confirmation of Post discharge follow up appt. Thank You

## 2023-11-11 ENCOUNTER — Inpatient Hospital Stay: Admitting: Family Medicine

## 2023-11-22 ENCOUNTER — Ambulatory Visit: Payer: Self-pay

## 2023-11-22 DIAGNOSIS — K861 Other chronic pancreatitis: Secondary | ICD-10-CM | POA: Diagnosis not present

## 2023-11-22 DIAGNOSIS — G43909 Migraine, unspecified, not intractable, without status migrainosus: Secondary | ICD-10-CM | POA: Diagnosis not present

## 2023-11-22 DIAGNOSIS — M316 Other giant cell arteritis: Secondary | ICD-10-CM | POA: Diagnosis not present

## 2023-11-22 DIAGNOSIS — M797 Fibromyalgia: Secondary | ICD-10-CM | POA: Diagnosis not present

## 2023-11-22 DIAGNOSIS — I959 Hypotension, unspecified: Secondary | ICD-10-CM | POA: Diagnosis not present

## 2023-11-22 DIAGNOSIS — K863 Pseudocyst of pancreas: Secondary | ICD-10-CM | POA: Diagnosis not present

## 2023-11-22 DIAGNOSIS — L89152 Pressure ulcer of sacral region, stage 2: Secondary | ICD-10-CM | POA: Diagnosis not present

## 2023-11-22 DIAGNOSIS — K219 Gastro-esophageal reflux disease without esophagitis: Secondary | ICD-10-CM | POA: Diagnosis not present

## 2023-11-22 DIAGNOSIS — K581 Irritable bowel syndrome with constipation: Secondary | ICD-10-CM | POA: Diagnosis not present

## 2023-11-22 DIAGNOSIS — M069 Rheumatoid arthritis, unspecified: Secondary | ICD-10-CM | POA: Diagnosis not present

## 2023-11-22 DIAGNOSIS — G8929 Other chronic pain: Secondary | ICD-10-CM | POA: Diagnosis not present

## 2023-11-22 DIAGNOSIS — E8809 Other disorders of plasma-protein metabolism, not elsewhere classified: Secondary | ICD-10-CM | POA: Diagnosis not present

## 2023-11-22 DIAGNOSIS — G9341 Metabolic encephalopathy: Secondary | ICD-10-CM | POA: Diagnosis not present

## 2023-11-22 DIAGNOSIS — D72829 Elevated white blood cell count, unspecified: Secondary | ICD-10-CM | POA: Diagnosis not present

## 2023-11-22 DIAGNOSIS — M1990 Unspecified osteoarthritis, unspecified site: Secondary | ICD-10-CM | POA: Diagnosis not present

## 2023-11-22 DIAGNOSIS — J4489 Other specified chronic obstructive pulmonary disease: Secondary | ICD-10-CM | POA: Diagnosis not present

## 2023-11-22 DIAGNOSIS — M810 Age-related osteoporosis without current pathological fracture: Secondary | ICD-10-CM | POA: Diagnosis not present

## 2023-11-22 DIAGNOSIS — E46 Unspecified protein-calorie malnutrition: Secondary | ICD-10-CM | POA: Diagnosis not present

## 2023-11-22 DIAGNOSIS — M8580 Other specified disorders of bone density and structure, unspecified site: Secondary | ICD-10-CM | POA: Diagnosis not present

## 2023-11-22 DIAGNOSIS — E785 Hyperlipidemia, unspecified: Secondary | ICD-10-CM | POA: Diagnosis not present

## 2023-11-22 DIAGNOSIS — J9611 Chronic respiratory failure with hypoxia: Secondary | ICD-10-CM | POA: Diagnosis not present

## 2023-11-22 DIAGNOSIS — I10 Essential (primary) hypertension: Secondary | ICD-10-CM | POA: Diagnosis not present

## 2023-11-22 NOTE — Telephone Encounter (Signed)
 E2C2 RNs attempted to reach pt three times unsuccessfully. Please advise.

## 2023-11-22 NOTE — Telephone Encounter (Signed)
 Left message to call back

## 2023-11-22 NOTE — Telephone Encounter (Signed)
  Chief Complaint: Reporting fall and edema Symptoms: Edema Frequency: Fall last week X1, edema today Pertinent Negatives: Patient denies per Bonita Quin RN no other symptoms Disposition: [] ED /[] Urgent Care (no appt availability in office) / [] Appointment(In office/virtual)/ []  [place] [facility]/ [] Home Care/ [] Refused Recommended Disposition /[] [place] [facility]/ []  Follow-up with PCP Additional Notes:  Bonita Quin RN with [facility] calling to report Abigail [name] had a fall on Sunday while out of town, she did not sustain any injury. Also reporting to Dr. Girtha Rm that on exam today Bonita Quin noted 2+ pitting edema. No other symptoms. VSS. Bonita Quin asks this Clinical research associate to contact patient to schedule  hospital follow up visit. Attempted to contact patient but no answer, left message to return call.   Copied from CRM [id number]. Topic: Clinical - Red Word Triage >> Nov 22, 2023  2:35 PM Elle L wrote: Red Word that prompted transfer to Nurse Triage: Bonita Quin with [facility] was calling to report that the patient fell earlier this week. Reason for Disposition  [1] MODERATE leg swelling (e.g., swelling extends up to knees) AND [2] new-onset or worsening  Protocols used: Leg Swelling and Edema-A-AH

## 2023-11-22 NOTE — Telephone Encounter (Signed)
Second attempt to reach pt. Left message to call back. 

## 2023-11-25 DIAGNOSIS — K219 Gastro-esophageal reflux disease without esophagitis: Secondary | ICD-10-CM | POA: Diagnosis not present

## 2023-11-25 DIAGNOSIS — G8929 Other chronic pain: Secondary | ICD-10-CM | POA: Diagnosis not present

## 2023-11-25 DIAGNOSIS — M797 Fibromyalgia: Secondary | ICD-10-CM | POA: Diagnosis not present

## 2023-11-25 DIAGNOSIS — M069 Rheumatoid arthritis, unspecified: Secondary | ICD-10-CM | POA: Diagnosis not present

## 2023-11-25 DIAGNOSIS — I959 Hypotension, unspecified: Secondary | ICD-10-CM | POA: Diagnosis not present

## 2023-11-25 DIAGNOSIS — E785 Hyperlipidemia, unspecified: Secondary | ICD-10-CM | POA: Diagnosis not present

## 2023-11-25 DIAGNOSIS — L89152 Pressure ulcer of sacral region, stage 2: Secondary | ICD-10-CM | POA: Diagnosis not present

## 2023-11-25 DIAGNOSIS — G9341 Metabolic encephalopathy: Secondary | ICD-10-CM | POA: Diagnosis not present

## 2023-11-25 DIAGNOSIS — J4489 Other specified chronic obstructive pulmonary disease: Secondary | ICD-10-CM | POA: Diagnosis not present

## 2023-11-25 DIAGNOSIS — M316 Other giant cell arteritis: Secondary | ICD-10-CM | POA: Diagnosis not present

## 2023-11-25 DIAGNOSIS — G43909 Migraine, unspecified, not intractable, without status migrainosus: Secondary | ICD-10-CM | POA: Diagnosis not present

## 2023-11-25 DIAGNOSIS — M8580 Other specified disorders of bone density and structure, unspecified site: Secondary | ICD-10-CM | POA: Diagnosis not present

## 2023-11-25 DIAGNOSIS — K863 Pseudocyst of pancreas: Secondary | ICD-10-CM | POA: Diagnosis not present

## 2023-11-25 DIAGNOSIS — M810 Age-related osteoporosis without current pathological fracture: Secondary | ICD-10-CM | POA: Diagnosis not present

## 2023-11-25 DIAGNOSIS — K581 Irritable bowel syndrome with constipation: Secondary | ICD-10-CM | POA: Diagnosis not present

## 2023-11-25 DIAGNOSIS — E8809 Other disorders of plasma-protein metabolism, not elsewhere classified: Secondary | ICD-10-CM | POA: Diagnosis not present

## 2023-11-25 DIAGNOSIS — D72829 Elevated white blood cell count, unspecified: Secondary | ICD-10-CM | POA: Diagnosis not present

## 2023-11-25 DIAGNOSIS — M1990 Unspecified osteoarthritis, unspecified site: Secondary | ICD-10-CM | POA: Diagnosis not present

## 2023-11-25 DIAGNOSIS — J9611 Chronic respiratory failure with hypoxia: Secondary | ICD-10-CM | POA: Diagnosis not present

## 2023-11-25 DIAGNOSIS — K861 Other chronic pancreatitis: Secondary | ICD-10-CM | POA: Diagnosis not present

## 2023-11-25 DIAGNOSIS — I10 Essential (primary) hypertension: Secondary | ICD-10-CM | POA: Diagnosis not present

## 2023-11-25 DIAGNOSIS — E46 Unspecified protein-calorie malnutrition: Secondary | ICD-10-CM | POA: Diagnosis not present

## 2023-11-27 DIAGNOSIS — M0609 Rheumatoid arthritis without rheumatoid factor, multiple sites: Secondary | ICD-10-CM | POA: Diagnosis not present

## 2023-11-27 DIAGNOSIS — M51362 Other intervertebral disc degeneration, lumbar region with discogenic back pain and lower extremity pain: Secondary | ICD-10-CM | POA: Diagnosis not present

## 2023-11-27 DIAGNOSIS — M8580 Other specified disorders of bone density and structure, unspecified site: Secondary | ICD-10-CM | POA: Diagnosis not present

## 2023-11-28 ENCOUNTER — Ambulatory Visit: Payer: Self-pay

## 2023-11-28 DIAGNOSIS — J4489 Other specified chronic obstructive pulmonary disease: Secondary | ICD-10-CM | POA: Diagnosis not present

## 2023-11-28 DIAGNOSIS — I10 Essential (primary) hypertension: Secondary | ICD-10-CM | POA: Diagnosis not present

## 2023-11-28 DIAGNOSIS — L89152 Pressure ulcer of sacral region, stage 2: Secondary | ICD-10-CM | POA: Diagnosis not present

## 2023-11-28 DIAGNOSIS — G8929 Other chronic pain: Secondary | ICD-10-CM | POA: Diagnosis not present

## 2023-11-28 DIAGNOSIS — E8809 Other disorders of plasma-protein metabolism, not elsewhere classified: Secondary | ICD-10-CM | POA: Diagnosis not present

## 2023-11-28 DIAGNOSIS — I959 Hypotension, unspecified: Secondary | ICD-10-CM | POA: Diagnosis not present

## 2023-11-28 DIAGNOSIS — G9341 Metabolic encephalopathy: Secondary | ICD-10-CM | POA: Diagnosis not present

## 2023-11-28 DIAGNOSIS — K861 Other chronic pancreatitis: Secondary | ICD-10-CM | POA: Diagnosis not present

## 2023-11-28 DIAGNOSIS — D72829 Elevated white blood cell count, unspecified: Secondary | ICD-10-CM | POA: Diagnosis not present

## 2023-11-28 DIAGNOSIS — M8580 Other specified disorders of bone density and structure, unspecified site: Secondary | ICD-10-CM | POA: Diagnosis not present

## 2023-11-28 DIAGNOSIS — E46 Unspecified protein-calorie malnutrition: Secondary | ICD-10-CM | POA: Diagnosis not present

## 2023-11-28 DIAGNOSIS — M797 Fibromyalgia: Secondary | ICD-10-CM | POA: Diagnosis not present

## 2023-11-28 DIAGNOSIS — M069 Rheumatoid arthritis, unspecified: Secondary | ICD-10-CM | POA: Diagnosis not present

## 2023-11-28 DIAGNOSIS — M1990 Unspecified osteoarthritis, unspecified site: Secondary | ICD-10-CM | POA: Diagnosis not present

## 2023-11-28 DIAGNOSIS — K581 Irritable bowel syndrome with constipation: Secondary | ICD-10-CM | POA: Diagnosis not present

## 2023-11-28 DIAGNOSIS — M810 Age-related osteoporosis without current pathological fracture: Secondary | ICD-10-CM | POA: Diagnosis not present

## 2023-11-28 DIAGNOSIS — K219 Gastro-esophageal reflux disease without esophagitis: Secondary | ICD-10-CM | POA: Diagnosis not present

## 2023-11-28 DIAGNOSIS — E785 Hyperlipidemia, unspecified: Secondary | ICD-10-CM | POA: Diagnosis not present

## 2023-11-28 DIAGNOSIS — M316 Other giant cell arteritis: Secondary | ICD-10-CM | POA: Diagnosis not present

## 2023-11-28 DIAGNOSIS — J9611 Chronic respiratory failure with hypoxia: Secondary | ICD-10-CM | POA: Diagnosis not present

## 2023-11-28 DIAGNOSIS — G43909 Migraine, unspecified, not intractable, without status migrainosus: Secondary | ICD-10-CM | POA: Diagnosis not present

## 2023-11-28 DIAGNOSIS — K863 Pseudocyst of pancreas: Secondary | ICD-10-CM | POA: Diagnosis not present

## 2023-11-28 NOTE — Telephone Encounter (Signed)
 This RN received a warm transfer from the patient's home health nurse, Halsey, from [place]. Ana reported that she was conducting a routine home visit and patient mentioned that she has been hallucinating for 4-6 weeks. Patient reported to [facility] that she saw many "snakes" while outside with a friend yesterday. Patient also reported seeing extra "eyebrows" and "mustaches" on people on TV. Darien Ramus stated that the patient was covered in sores and had a fall on Saturday. Ana stated that the patient had also mentioned a lack of appetite and decreased urinary output. This RN attempted to contact patient for further evaluation. No answer, unable to leave a message. Routing for additional call back attempts.   Copied from CRM [id number]. Topic: Clinical - Red Word Triage >> Nov 28, 2023  1:05 PM Franchot Heidelberg wrote: Red Word that prompted transfer to Nurse Triage: Pt fell on Sunday, no injuries sustained. Hallucinations

## 2023-11-28 NOTE — Telephone Encounter (Addendum)
 11/28/23 2:33p- 3rd attempt, no answer. VM not set up. Will route to clinic for f/u 11/28/23 4:09pm- Placed call to [facility] nurse Ana [phone]) to see if she had additional contact information for the patient. No answer, LVMTCB # [phone]   Pt called, no answer, VM not set up.   Copied from CRM [id number]. Topic: Clinical - Red Word Triage >> Nov 28, 2023  1:05 PM Franchot Heidelberg wrote: Red Word that prompted transfer to Nurse Triage: Pt fell on Sunday, no injuries sustained. Hallucinations

## 2023-11-28 NOTE — Telephone Encounter (Signed)
 Pt called, no answer, VM not set up.   Copied from CRM [id number]. Topic: Clinical - Red Word Triage >> Nov 28, 2023  1:05 PM Franchot Heidelberg wrote: Red Word that prompted transfer to Nurse Triage: Pt fell on Sunday, no injuries sustained. Hallucinations

## 2023-11-29 NOTE — Telephone Encounter (Signed)
 Called patient and was unable to reach nor leave VM message due to mailbox being full.

## 2023-12-03 DIAGNOSIS — K581 Irritable bowel syndrome with constipation: Secondary | ICD-10-CM | POA: Diagnosis not present

## 2023-12-03 DIAGNOSIS — M316 Other giant cell arteritis: Secondary | ICD-10-CM | POA: Diagnosis not present

## 2023-12-03 DIAGNOSIS — K861 Other chronic pancreatitis: Secondary | ICD-10-CM | POA: Diagnosis not present

## 2023-12-03 DIAGNOSIS — J9611 Chronic respiratory failure with hypoxia: Secondary | ICD-10-CM | POA: Diagnosis not present

## 2023-12-03 DIAGNOSIS — K219 Gastro-esophageal reflux disease without esophagitis: Secondary | ICD-10-CM | POA: Diagnosis not present

## 2023-12-03 DIAGNOSIS — E46 Unspecified protein-calorie malnutrition: Secondary | ICD-10-CM | POA: Diagnosis not present

## 2023-12-03 DIAGNOSIS — I959 Hypotension, unspecified: Secondary | ICD-10-CM | POA: Diagnosis not present

## 2023-12-03 DIAGNOSIS — M8580 Other specified disorders of bone density and structure, unspecified site: Secondary | ICD-10-CM | POA: Diagnosis not present

## 2023-12-03 DIAGNOSIS — M1990 Unspecified osteoarthritis, unspecified site: Secondary | ICD-10-CM | POA: Diagnosis not present

## 2023-12-03 DIAGNOSIS — D72829 Elevated white blood cell count, unspecified: Secondary | ICD-10-CM | POA: Diagnosis not present

## 2023-12-03 DIAGNOSIS — I10 Essential (primary) hypertension: Secondary | ICD-10-CM | POA: Diagnosis not present

## 2023-12-03 DIAGNOSIS — G9341 Metabolic encephalopathy: Secondary | ICD-10-CM | POA: Diagnosis not present

## 2023-12-03 DIAGNOSIS — K863 Pseudocyst of pancreas: Secondary | ICD-10-CM | POA: Diagnosis not present

## 2023-12-03 DIAGNOSIS — M810 Age-related osteoporosis without current pathological fracture: Secondary | ICD-10-CM | POA: Diagnosis not present

## 2023-12-03 DIAGNOSIS — J4489 Other specified chronic obstructive pulmonary disease: Secondary | ICD-10-CM | POA: Diagnosis not present

## 2023-12-03 DIAGNOSIS — G43909 Migraine, unspecified, not intractable, without status migrainosus: Secondary | ICD-10-CM | POA: Diagnosis not present

## 2023-12-03 DIAGNOSIS — L89152 Pressure ulcer of sacral region, stage 2: Secondary | ICD-10-CM | POA: Diagnosis not present

## 2023-12-03 DIAGNOSIS — E785 Hyperlipidemia, unspecified: Secondary | ICD-10-CM | POA: Diagnosis not present

## 2023-12-03 DIAGNOSIS — G8929 Other chronic pain: Secondary | ICD-10-CM | POA: Diagnosis not present

## 2023-12-03 DIAGNOSIS — E8809 Other disorders of plasma-protein metabolism, not elsewhere classified: Secondary | ICD-10-CM | POA: Diagnosis not present

## 2023-12-03 DIAGNOSIS — M797 Fibromyalgia: Secondary | ICD-10-CM | POA: Diagnosis not present

## 2023-12-03 DIAGNOSIS — M069 Rheumatoid arthritis, unspecified: Secondary | ICD-10-CM | POA: Diagnosis not present

## 2023-12-04 DIAGNOSIS — Z91148 Patient's other noncompliance with medication regimen for other reason: Secondary | ICD-10-CM | POA: Diagnosis not present

## 2023-12-04 DIAGNOSIS — K861 Other chronic pancreatitis: Secondary | ICD-10-CM | POA: Diagnosis not present

## 2023-12-04 DIAGNOSIS — E46 Unspecified protein-calorie malnutrition: Secondary | ICD-10-CM | POA: Diagnosis not present

## 2023-12-04 DIAGNOSIS — M069 Rheumatoid arthritis, unspecified: Secondary | ICD-10-CM | POA: Diagnosis not present

## 2023-12-04 DIAGNOSIS — R569 Unspecified convulsions: Secondary | ICD-10-CM | POA: Diagnosis not present

## 2023-12-04 DIAGNOSIS — L89152 Pressure ulcer of sacral region, stage 2: Secondary | ICD-10-CM | POA: Diagnosis not present

## 2023-12-04 DIAGNOSIS — M8580 Other specified disorders of bone density and structure, unspecified site: Secondary | ICD-10-CM | POA: Diagnosis not present

## 2023-12-04 DIAGNOSIS — I10 Essential (primary) hypertension: Secondary | ICD-10-CM | POA: Diagnosis not present

## 2023-12-04 DIAGNOSIS — M316 Other giant cell arteritis: Secondary | ICD-10-CM | POA: Diagnosis not present

## 2023-12-04 DIAGNOSIS — I959 Hypotension, unspecified: Secondary | ICD-10-CM | POA: Diagnosis not present

## 2023-12-04 DIAGNOSIS — G8929 Other chronic pain: Secondary | ICD-10-CM | POA: Diagnosis not present

## 2023-12-04 DIAGNOSIS — E8809 Other disorders of plasma-protein metabolism, not elsewhere classified: Secondary | ICD-10-CM | POA: Diagnosis not present

## 2023-12-04 DIAGNOSIS — Z79899 Other long term (current) drug therapy: Secondary | ICD-10-CM | POA: Diagnosis not present

## 2023-12-04 DIAGNOSIS — K581 Irritable bowel syndrome with constipation: Secondary | ICD-10-CM | POA: Diagnosis not present

## 2023-12-04 DIAGNOSIS — G43909 Migraine, unspecified, not intractable, without status migrainosus: Secondary | ICD-10-CM | POA: Diagnosis not present

## 2023-12-04 DIAGNOSIS — J9611 Chronic respiratory failure with hypoxia: Secondary | ICD-10-CM | POA: Diagnosis not present

## 2023-12-04 DIAGNOSIS — E785 Hyperlipidemia, unspecified: Secondary | ICD-10-CM | POA: Diagnosis not present

## 2023-12-04 DIAGNOSIS — M797 Fibromyalgia: Secondary | ICD-10-CM | POA: Diagnosis not present

## 2023-12-04 DIAGNOSIS — J4489 Other specified chronic obstructive pulmonary disease: Secondary | ICD-10-CM | POA: Diagnosis not present

## 2023-12-04 DIAGNOSIS — G9341 Metabolic encephalopathy: Secondary | ICD-10-CM | POA: Diagnosis not present

## 2023-12-04 DIAGNOSIS — D72829 Elevated white blood cell count, unspecified: Secondary | ICD-10-CM | POA: Diagnosis not present

## 2023-12-04 DIAGNOSIS — M1990 Unspecified osteoarthritis, unspecified site: Secondary | ICD-10-CM | POA: Diagnosis not present

## 2023-12-04 DIAGNOSIS — M810 Age-related osteoporosis without current pathological fracture: Secondary | ICD-10-CM | POA: Diagnosis not present

## 2023-12-04 DIAGNOSIS — K219 Gastro-esophageal reflux disease without esophagitis: Secondary | ICD-10-CM | POA: Diagnosis not present

## 2023-12-04 DIAGNOSIS — H538 Other visual disturbances: Secondary | ICD-10-CM | POA: Diagnosis not present

## 2023-12-04 DIAGNOSIS — K863 Pseudocyst of pancreas: Secondary | ICD-10-CM | POA: Diagnosis not present

## 2023-12-05 DIAGNOSIS — I10 Essential (primary) hypertension: Secondary | ICD-10-CM | POA: Diagnosis not present

## 2023-12-05 DIAGNOSIS — M810 Age-related osteoporosis without current pathological fracture: Secondary | ICD-10-CM | POA: Diagnosis not present

## 2023-12-05 DIAGNOSIS — E8809 Other disorders of plasma-protein metabolism, not elsewhere classified: Secondary | ICD-10-CM | POA: Diagnosis not present

## 2023-12-05 DIAGNOSIS — M069 Rheumatoid arthritis, unspecified: Secondary | ICD-10-CM | POA: Diagnosis not present

## 2023-12-05 DIAGNOSIS — M316 Other giant cell arteritis: Secondary | ICD-10-CM | POA: Diagnosis not present

## 2023-12-05 DIAGNOSIS — D72829 Elevated white blood cell count, unspecified: Secondary | ICD-10-CM | POA: Diagnosis not present

## 2023-12-05 DIAGNOSIS — G43909 Migraine, unspecified, not intractable, without status migrainosus: Secondary | ICD-10-CM | POA: Diagnosis not present

## 2023-12-05 DIAGNOSIS — G8929 Other chronic pain: Secondary | ICD-10-CM | POA: Diagnosis not present

## 2023-12-05 DIAGNOSIS — E46 Unspecified protein-calorie malnutrition: Secondary | ICD-10-CM | POA: Diagnosis not present

## 2023-12-05 DIAGNOSIS — K861 Other chronic pancreatitis: Secondary | ICD-10-CM | POA: Diagnosis not present

## 2023-12-05 DIAGNOSIS — E785 Hyperlipidemia, unspecified: Secondary | ICD-10-CM | POA: Diagnosis not present

## 2023-12-05 DIAGNOSIS — L89152 Pressure ulcer of sacral region, stage 2: Secondary | ICD-10-CM | POA: Diagnosis not present

## 2023-12-05 DIAGNOSIS — J9611 Chronic respiratory failure with hypoxia: Secondary | ICD-10-CM | POA: Diagnosis not present

## 2023-12-05 DIAGNOSIS — K863 Pseudocyst of pancreas: Secondary | ICD-10-CM | POA: Diagnosis not present

## 2023-12-05 DIAGNOSIS — G9341 Metabolic encephalopathy: Secondary | ICD-10-CM | POA: Diagnosis not present

## 2023-12-05 DIAGNOSIS — I959 Hypotension, unspecified: Secondary | ICD-10-CM | POA: Diagnosis not present

## 2023-12-05 DIAGNOSIS — J4489 Other specified chronic obstructive pulmonary disease: Secondary | ICD-10-CM | POA: Diagnosis not present

## 2023-12-05 DIAGNOSIS — M797 Fibromyalgia: Secondary | ICD-10-CM | POA: Diagnosis not present

## 2023-12-05 DIAGNOSIS — K581 Irritable bowel syndrome with constipation: Secondary | ICD-10-CM | POA: Diagnosis not present

## 2023-12-05 DIAGNOSIS — M1990 Unspecified osteoarthritis, unspecified site: Secondary | ICD-10-CM | POA: Diagnosis not present

## 2023-12-05 DIAGNOSIS — K219 Gastro-esophageal reflux disease without esophagitis: Secondary | ICD-10-CM | POA: Diagnosis not present

## 2023-12-05 DIAGNOSIS — M8580 Other specified disorders of bone density and structure, unspecified site: Secondary | ICD-10-CM | POA: Diagnosis not present

## 2023-12-10 ENCOUNTER — Inpatient Hospital Stay: Admitting: Family Medicine

## 2023-12-11 DIAGNOSIS — E871 Hypo-osmolality and hyponatremia: Secondary | ICD-10-CM | POA: Diagnosis not present

## 2023-12-11 DIAGNOSIS — E876 Hypokalemia: Secondary | ICD-10-CM | POA: Diagnosis not present

## 2023-12-11 DIAGNOSIS — E8809 Other disorders of plasma-protein metabolism, not elsewhere classified: Secondary | ICD-10-CM | POA: Diagnosis not present

## 2023-12-11 DIAGNOSIS — M797 Fibromyalgia: Secondary | ICD-10-CM | POA: Diagnosis not present

## 2023-12-11 DIAGNOSIS — M1990 Unspecified osteoarthritis, unspecified site: Secondary | ICD-10-CM | POA: Diagnosis not present

## 2023-12-11 DIAGNOSIS — R6510 Systemic inflammatory response syndrome (SIRS) of non-infectious origin without acute organ dysfunction: Secondary | ICD-10-CM | POA: Diagnosis not present

## 2023-12-11 DIAGNOSIS — M316 Other giant cell arteritis: Secondary | ICD-10-CM | POA: Diagnosis not present

## 2023-12-11 DIAGNOSIS — I700 Atherosclerosis of aorta: Secondary | ICD-10-CM | POA: Diagnosis not present

## 2023-12-11 DIAGNOSIS — J9601 Acute respiratory failure with hypoxia: Secondary | ICD-10-CM | POA: Diagnosis not present

## 2023-12-11 DIAGNOSIS — M4186 Other forms of scoliosis, lumbar region: Secondary | ICD-10-CM | POA: Diagnosis not present

## 2023-12-11 DIAGNOSIS — M069 Rheumatoid arthritis, unspecified: Secondary | ICD-10-CM | POA: Diagnosis not present

## 2023-12-11 DIAGNOSIS — G8192 Hemiplegia, unspecified affecting left dominant side: Secondary | ICD-10-CM | POA: Diagnosis not present

## 2023-12-11 DIAGNOSIS — K863 Pseudocyst of pancreas: Secondary | ICD-10-CM | POA: Diagnosis not present

## 2023-12-11 DIAGNOSIS — I959 Hypotension, unspecified: Secondary | ICD-10-CM | POA: Diagnosis not present

## 2023-12-11 DIAGNOSIS — J4489 Other specified chronic obstructive pulmonary disease: Secondary | ICD-10-CM | POA: Diagnosis not present

## 2023-12-11 DIAGNOSIS — M47812 Spondylosis without myelopathy or radiculopathy, cervical region: Secondary | ICD-10-CM | POA: Diagnosis not present

## 2023-12-11 DIAGNOSIS — M5030 Other cervical disc degeneration, unspecified cervical region: Secondary | ICD-10-CM | POA: Diagnosis not present

## 2023-12-11 DIAGNOSIS — G9341 Metabolic encephalopathy: Secondary | ICD-10-CM | POA: Diagnosis not present

## 2023-12-11 DIAGNOSIS — K861 Other chronic pancreatitis: Secondary | ICD-10-CM | POA: Diagnosis not present

## 2023-12-11 DIAGNOSIS — I2510 Atherosclerotic heart disease of native coronary artery without angina pectoris: Secondary | ICD-10-CM | POA: Diagnosis not present

## 2023-12-11 DIAGNOSIS — I10 Essential (primary) hypertension: Secondary | ICD-10-CM | POA: Diagnosis not present

## 2023-12-11 DIAGNOSIS — J690 Pneumonitis due to inhalation of food and vomit: Secondary | ICD-10-CM | POA: Diagnosis not present

## 2023-12-11 DIAGNOSIS — G8929 Other chronic pain: Secondary | ICD-10-CM | POA: Diagnosis not present

## 2023-12-11 DIAGNOSIS — J4520 Mild intermittent asthma, uncomplicated: Secondary | ICD-10-CM | POA: Diagnosis not present

## 2023-12-15 DIAGNOSIS — K859 Acute pancreatitis without necrosis or infection, unspecified: Secondary | ICD-10-CM | POA: Diagnosis not present

## 2023-12-16 DIAGNOSIS — M545 Low back pain, unspecified: Secondary | ICD-10-CM | POA: Diagnosis not present

## 2023-12-16 DIAGNOSIS — G8929 Other chronic pain: Secondary | ICD-10-CM | POA: Diagnosis not present

## 2023-12-16 DIAGNOSIS — K863 Pseudocyst of pancreas: Secondary | ICD-10-CM | POA: Diagnosis not present

## 2023-12-16 DIAGNOSIS — K861 Other chronic pancreatitis: Secondary | ICD-10-CM | POA: Diagnosis not present

## 2023-12-16 DIAGNOSIS — Z91148 Patient's other noncompliance with medication regimen for other reason: Secondary | ICD-10-CM | POA: Diagnosis not present

## 2023-12-16 DIAGNOSIS — M7989 Other specified soft tissue disorders: Secondary | ICD-10-CM | POA: Diagnosis not present

## 2023-12-20 ENCOUNTER — Ambulatory Visit: Payer: Self-pay

## 2023-12-20 ENCOUNTER — Telehealth: Payer: Self-pay | Admitting: Family Medicine

## 2023-12-20 DIAGNOSIS — J690 Pneumonitis due to inhalation of food and vomit: Secondary | ICD-10-CM | POA: Diagnosis not present

## 2023-12-20 DIAGNOSIS — M5030 Other cervical disc degeneration, unspecified cervical region: Secondary | ICD-10-CM | POA: Diagnosis not present

## 2023-12-20 DIAGNOSIS — M47812 Spondylosis without myelopathy or radiculopathy, cervical region: Secondary | ICD-10-CM | POA: Diagnosis not present

## 2023-12-20 DIAGNOSIS — I700 Atherosclerosis of aorta: Secondary | ICD-10-CM | POA: Diagnosis not present

## 2023-12-20 DIAGNOSIS — M4186 Other forms of scoliosis, lumbar region: Secondary | ICD-10-CM | POA: Diagnosis not present

## 2023-12-20 DIAGNOSIS — E871 Hypo-osmolality and hyponatremia: Secondary | ICD-10-CM | POA: Diagnosis not present

## 2023-12-20 DIAGNOSIS — R6510 Systemic inflammatory response syndrome (SIRS) of non-infectious origin without acute organ dysfunction: Secondary | ICD-10-CM | POA: Diagnosis not present

## 2023-12-20 DIAGNOSIS — M797 Fibromyalgia: Secondary | ICD-10-CM | POA: Diagnosis not present

## 2023-12-20 DIAGNOSIS — G9341 Metabolic encephalopathy: Secondary | ICD-10-CM | POA: Diagnosis not present

## 2023-12-20 DIAGNOSIS — M069 Rheumatoid arthritis, unspecified: Secondary | ICD-10-CM | POA: Diagnosis not present

## 2023-12-20 DIAGNOSIS — J4489 Other specified chronic obstructive pulmonary disease: Secondary | ICD-10-CM | POA: Diagnosis not present

## 2023-12-20 DIAGNOSIS — J4520 Mild intermittent asthma, uncomplicated: Secondary | ICD-10-CM | POA: Diagnosis not present

## 2023-12-20 DIAGNOSIS — J9601 Acute respiratory failure with hypoxia: Secondary | ICD-10-CM | POA: Diagnosis not present

## 2023-12-20 DIAGNOSIS — K863 Pseudocyst of pancreas: Secondary | ICD-10-CM | POA: Diagnosis not present

## 2023-12-20 DIAGNOSIS — K861 Other chronic pancreatitis: Secondary | ICD-10-CM | POA: Diagnosis not present

## 2023-12-20 DIAGNOSIS — I10 Essential (primary) hypertension: Secondary | ICD-10-CM | POA: Diagnosis not present

## 2023-12-20 DIAGNOSIS — G8192 Hemiplegia, unspecified affecting left dominant side: Secondary | ICD-10-CM | POA: Diagnosis not present

## 2023-12-20 DIAGNOSIS — I2510 Atherosclerotic heart disease of native coronary artery without angina pectoris: Secondary | ICD-10-CM | POA: Diagnosis not present

## 2023-12-20 DIAGNOSIS — M316 Other giant cell arteritis: Secondary | ICD-10-CM | POA: Diagnosis not present

## 2023-12-20 DIAGNOSIS — M1990 Unspecified osteoarthritis, unspecified site: Secondary | ICD-10-CM | POA: Diagnosis not present

## 2023-12-20 DIAGNOSIS — I959 Hypotension, unspecified: Secondary | ICD-10-CM | POA: Diagnosis not present

## 2023-12-20 DIAGNOSIS — E876 Hypokalemia: Secondary | ICD-10-CM | POA: Diagnosis not present

## 2023-12-20 DIAGNOSIS — G8929 Other chronic pain: Secondary | ICD-10-CM | POA: Diagnosis not present

## 2023-12-20 DIAGNOSIS — E8809 Other disorders of plasma-protein metabolism, not elsewhere classified: Secondary | ICD-10-CM | POA: Diagnosis not present

## 2023-12-20 NOTE — Telephone Encounter (Signed)
 Copied from CRM [id number]. Topic: General - Other >> Dec 20, 2023  4:28 PM Sophia H wrote: Reason for CRM: FYI Spk with Dawn from [facility] who wanted to report that the pt was unavailable by phone for visit.

## 2023-12-20 NOTE — Telephone Encounter (Signed)
 Chief Complaint: Fall  Symptoms: fall on Wednesday hit head and hip Frequency: Wednesday Pertinent Negatives: Patient denies headache, swelling, chest pain,  Disposition: [] ED /[] Urgent Care (no appt availability in office) / [x] Appointment(In office/virtual)/ []  [place] [facility]/ [] Home Care/ [] Refused Recommended Disposition /[] [place] [facility]/ []  Follow-up with PCP Additional Notes: Patient states she fell after pulling on her door too hard to let her dogs in. She said she hit her head on the door and landed on her right hip. Patient denies any head pain but the right hip is hurting. Care advice was given and patient was scheduled for an appointment Monday to see her PCP.   Copied from CRM [id number]. Topic: Clinical - Red Word Triage >> Dec 20, 2023  3:03 PM Stanly Early wrote: Reason for CRM: Patient fell on Wednesday sharp/throbbing pain and she also hit her head. [facility] calling in -[place], [place] Reason for Disposition  MILD weakness (i.e., does not interfere with ability to work, go to school, normal activities)  (Exception: Mild weakness is a chronic symptom.)  Answer Assessment - Initial Assessment Questions 1. MECHANISM: "How did the fall happen?"     I was opening the door and I pulled too hard I lost balance and I fell 2. DOMESTIC VIOLENCE AND ELDER ABUSE SCREENING: "Did you fall because someone pushed you or tried to hurt you?" If Yes, ask: "Are you safe now?"     No 3. ONSET: "When did the fall happen?" (e.g., minutes, hours, or days ago)     Wednesday 4. LOCATION: "What part of the body hit the ground?" (e.g., back, buttocks, head, hips, knees, hands, head, stomach)     I hit my head, right hip  5. INJURY: "Did you hurt (injure) yourself when you fell?" If Yes, ask: "What did you injure? Tell me more about this?" (e.g., body area; type of injury; pain severity)"     Right side of the head  6. PAIN: "Is there any pain?" If Yes, ask: "How bad is the pain?"  (e.g., Scale 1-10; or mild,  moderate, severe)   - NONE (0): No pain   - MILD (1-3): Doesn't interfere with normal activities    - MODERATE (4-7): Interferes with normal activities or awakens from sleep    - SEVERE (8-10): Excruciating pain, unable to do any normal activities      8/10 7. SIZE: For cuts, bruises, or swelling, ask: "How large is it?" (e.g., inches or centimeters)      Cut  8. PREGNANCY: "Is there any chance you are pregnant?" "When was your last menstrual period?"     N/A  9. OTHER SYMPTOMS: "Do you have any other symptoms?" (e.g., dizziness, fever, weakness; new onset or worsening).      No 10. CAUSE: "What do you think caused the fall (or falling)?" (e.g., tripped, dizzy spell)       Loss balance  Protocols used: Falls and [facility]

## 2023-12-23 ENCOUNTER — Ambulatory Visit: Admitting: Family Medicine

## 2023-12-23 DIAGNOSIS — M25552 Pain in left hip: Secondary | ICD-10-CM | POA: Diagnosis not present

## 2023-12-23 NOTE — Progress Notes (Deleted)
 Acute Care Office Visit  Subjective:   Kenishia Zaldivar [facility] 1966/07/13 12/23/2023  No chief complaint on file.  HPI: Presents today for an acute visit with complaint of hip pain after a fall  Symptoms have been present  *** Associated symptoms include: *** Pertinent negatives: *** Pain severity: *** Treatments tried include : *** Treatment effective : ***   The following portions of the patient's history were reviewed and updated as appropriate: past medical history, past surgical history, family history, social history, allergies, medications, and problem list.   Patient Active Problem List   Diagnosis Date Noted   Hypothyroidism 11/02/2023   Hyponatremia 09/27/2023   Chronic respiratory failure with hypoxia (HCC) 07/29/2023   Pancreatic pseudocyst 07/13/2023   Seizure disorder (HCC) 06/28/2023   Osteopenia 05/29/2023   Protein-calorie malnutrition, severe 12/21/2022   Giant cell arteritis (HCC) 12/19/2022   DNR (do not resuscitate) 12/19/2022   Hypokalemia 12/08/2022   Idiopathic acute pancreatitis without infection or necrosis 08/08/2022   Tobacco use disorder 08/08/2022   Asthma 04/26/2020   Drug-induced constipation 04/26/2020   GERD (gastroesophageal reflux disease) 11/12/2016   Chronic prescription opiate use - thru [facility] in [place], [place] 04/29/2016   Long term current use of immunosuppressive drug 04/29/2016   Depression 02/16/2013   Anxiety 02/13/2013   DDD (degenerative disc disease), cervical 02/13/2013   Rheumatoid arthritis (HCC) 02/13/2013   Past Medical History:  Diagnosis Date   Anxiety    Arthritis    Back pain    Basal cell carcinoma 10/04/2021   R axilla - needs excised 11/28/21   Basal cell carcinoma 10/04/2021   L antecubital excised 11/14/21   Diarrhea 11/12/2016   Fibromyalgia    Generalized abdominal pain 11/12/2016   H. pylori infection    Hyperlipidemia    IBS (irritable bowel syndrome)    Infectious colitis  04/29/2016   Migraines    Moderate dehydration 04/29/2016   Muscle pain    Opioid overdose (HCC) 12/07/2022   Reflux    Seizures (HCC)    Unexplained weight loss 11/12/2016   Past Surgical History:  Procedure Laterality Date   ABDOMINAL HYSTERECTOMY     APPENDECTOMY  2009   C5 FUSION     C6 FUSION     C7 FUSION     COLONOSCOPY  02/2006   COLONOSCOPY WITH PROPOFOL  N/A 12/25/2016   Procedure: COLONOSCOPY WITH PROPOFOL ;  Surgeon: Marshall Skeeter, MD;  Location: [facility] ENDOSCOPY;  Service: Endoscopy;  Laterality: N/A;   ESOPHAGOGASTRODUODENOSCOPY (EGD) WITH PROPOFOL  N/A 12/25/2016   Procedure: ESOPHAGOGASTRODUODENOSCOPY (EGD) WITH PROPOFOL ;  Surgeon: Marshall Skeeter, MD;  Location: [facility] ENDOSCOPY;  Service: Endoscopy;  Laterality: N/A;   FOOT SURGERY     HIP ARTHROPLASTY     L4 FUSION     L5 FUSION     S1 FUSION     Family History  Problem Relation Age of Onset   Diabetes Father    Hypertension Father    Outpatient Medications Prior to Visit  Medication Sig Dispense Refill   bisacodyl  (DULCOLAX) 5 MG EC tablet Take 1 tablet (5 mg total) by mouth daily as needed for moderate constipation. 30 tablet 0   carbamazepine  (TEGRETOL  XR) 100 MG 12 hr tablet Take 1 tablet (100 mg total) by mouth 2 (two) times daily. 60 tablet 2   DULoxetine  (CYMBALTA ) 30 MG capsule Take 3 capsules (90 mg total) by mouth daily. 90 capsule 3   gabapentin  (NEURONTIN ) 300 MG capsule  Take 1 capsule (300 mg total) by mouth 4 (four) times daily. 120 capsule 0   lamoTRIgine  (LAMICTAL ) 25 MG tablet Take 2 tablets (50 mg total) by mouth daily for 12 days, THEN 2 tablets (50 mg total) 2 (two) times daily for 14 days, THEN 4 tablets (100 mg total) 2 (two) times daily. 320 tablet 0   levETIRAcetam  (KEPPRA ) 500 MG tablet Take 500 mg by mouth 2 (two) times daily.     Melatonin 10 MG TABS Take 10 mg by mouth at bedtime.     mirtazapine  (REMERON ) 30 MG tablet Take 1 tablet (30 mg total) by mouth at bedtime. 30 tablet 2    morphine  (MSIR) 15 MG tablet Take 1 tablet (15 mg total) by mouth every 8 (eight) hours as needed for severe pain (pain score 7-10).     NON FORMULARY Apply 1 Application topically 3 (three) times daily as needed. Rock Salt Cream (Similar to [employer])     ondansetron  (ZOFRAN -ODT) 8 MG disintegrating tablet Take 1 tablet (8 mg total) by mouth every 8 (eight) hours as needed for nausea. 20 tablet 1   pantoprazole  (PROTONIX ) 40 MG tablet Take 40 mg by mouth daily.     potassium chloride  (KLOR-CON  M) 10 MEQ tablet Take 1 tablet (10 mEq total) by mouth daily. 30 tablet 3   No facility-administered medications prior to visit.   Allergies  Allergen Reactions   Nsaids Hives   Tapentadol Swelling, Rash and Other (See Comments)    Nucynta- Made her deathly sick   Cephalexin  Other (See Comments)    Reaction not cited   Codeine Other (See Comments)    Reaction not cited   Darvocet [Propoxyphene N-Acetaminophen ] Other (See Comments)    Reaction not cited   Latex Other (See Comments)    Reaction not cited   Silicone Other (See Comments)    Reaction not cited   Sulfa Antibiotics Hives   Tape Other (See Comments)    Reaction not cited   Meloxicam Rash     ROS: A complete ROS was performed with pertinent positives/negatives noted in the HPI. The remainder of the ROS are negative.    Objective:   There were no vitals filed for this visit.  GENERAL: Well-appearing, in NAD. Well nourished.  SKIN: Pink, warm and dry. No rash, lesion, ulceration, or ecchymoses.  Head: Normocephalic. NECK: Trachea midline. Full ROM w/o pain or tenderness. No lymphadenopathy.  EARS: Tympanic membranes are intact, translucent without bulging and without drainage. Appropriate landmarks visualized.  EYES: Conjunctiva clear without exudates. EOMI, PERRL, no drainage present.  NOSE: Septum midline w/o deformity. Nares patent, mucosa pink and non-inflamed w/o drainage. No sinus tenderness.  THROAT: Uvula midline.  Oropharynx clear. Tonsils non-inflamed without exudate. Mucous membranes pink and moist.  RESPIRATORY: Chest wall symmetrical. Respirations even and non-labored. Breath sounds clear to auscultation bilaterally.  CARDIAC: S1, S2 present, regular rate and rhythm without murmur or gallops. Peripheral pulses 2+ bilaterally.  MSK: Muscle tone and strength appropriate for age. Joints w/o tenderness, redness, or swelling.  EXTREMITIES: Without clubbing, cyanosis, or edema.  NEUROLOGIC: No motor or sensory deficits. Steady, even gait. C2-C12 intact.  PSYCH/MENTAL STATUS: Alert, oriented x 3. Cooperative, appropriate mood and affect.    No results found for any visits on 12/23/23.    Assessment & Plan:  ***  No orders of the defined types were placed in this encounter.  Lab Orders  No laboratory test(s) ordered today   No images are  attached to the encounter or orders placed in the encounter.  No follow-ups on file.    Patient to reach out to office if new, worrisome, or unresolved symptoms arise or if no improvement in patient's condition. Patient verbalized understanding and is agreeable to treatment plan. All questions answered to patient's satisfaction.    Wilhelmena Hanson, FNP

## 2023-12-24 ENCOUNTER — Ambulatory Visit (INDEPENDENT_AMBULATORY_CARE_PROVIDER_SITE_OTHER): Admitting: Family Medicine

## 2023-12-24 ENCOUNTER — Encounter: Payer: Self-pay | Admitting: Family Medicine

## 2023-12-24 VITALS — BP 109/75 | HR 110 | Temp 97.8°F | Resp 18 | Ht 68.0 in | Wt 140.0 lb

## 2023-12-24 DIAGNOSIS — R600 Localized edema: Secondary | ICD-10-CM | POA: Diagnosis not present

## 2023-12-24 DIAGNOSIS — Z79891 Long term (current) use of opiate analgesic: Secondary | ICD-10-CM | POA: Diagnosis not present

## 2023-12-24 DIAGNOSIS — M25551 Pain in right hip: Secondary | ICD-10-CM | POA: Diagnosis not present

## 2023-12-24 DIAGNOSIS — F418 Other specified anxiety disorders: Secondary | ICD-10-CM | POA: Diagnosis not present

## 2023-12-24 MED ORDER — POTASSIUM CHLORIDE CRYS ER 10 MEQ PO TBCR: 10.0000 meq | EXTENDED_RELEASE_TABLET | Freq: Two times a day (BID) | ORAL | 3 refills | Status: DC

## 2023-12-24 MED ORDER — FUROSEMIDE 20 MG PO TABS: 20.0000 mg | ORAL_TABLET | Freq: Every day | ORAL | 3 refills | Status: DC

## 2023-12-24 MED ORDER — POTASSIUM CHLORIDE CRYS ER 10 MEQ PO TBCR: 10.0000 meq | EXTENDED_RELEASE_TABLET | Freq: Every day | ORAL | 3 refills | Status: DC

## 2023-12-24 NOTE — Progress Notes (Unsigned)
   Established Patient Office Visit  Subjective   Patient ID: Abigail [name] [facility], female    DOB: 20-Jul-1966  Age: 58 y.o. MRN: [id number]  Chief Complaint  Patient presents with   Fall   Hip Injury    right     HPI   {History (Optional):23778}  ROS    Objective:     BP 109/75 (BP Location: Right Arm, Patient Position: Sitting, Cuff Size: Normal)   Pulse (!) 110   Temp 97.8 F (36.6 C) (Oral)   Resp 18   Ht 5\' 8"  (1.727 m)   Wt 140 lb (63.5 kg)   SpO2 90%   BMI 21.29 kg/m  {Vitals History (Optional):23777}  Physical Exam Vitals and nursing note reviewed.  Constitutional:      Appearance: Normal appearance.  HENT:     Head: Normocephalic and atraumatic.  Eyes:     Conjunctiva/sclera: Conjunctivae normal.  Cardiovascular:     Rate and Rhythm: Normal rate and regular rhythm.  Pulmonary:     Effort: Pulmonary effort is normal.     Breath sounds: Normal breath sounds.  Musculoskeletal:     Right lower leg: No edema.     Left lower leg: No edema.  Skin:    General: Skin is warm and dry.  Neurological:     Mental Status: She is alert and oriented to person, place, and time.     Comments: 2+ pitting edema to knees bilaterally.   Abduction is painful.  Internal rotation is painful.  Pian is in groin, greater trochanteric area and buttocks left.    Psychiatric:        Mood and Affect: Mood normal.        Behavior: Behavior normal.        Thought Content: Thought content normal.        Judgment: Judgment normal.      {Perform Simple Foot Exam  Perform Detailed exam:1} {Insert foot Exam (Optional):30965}   No results found for any visits on 12/24/23.  {Labs (Optional):23779}  The 10-year ASCVD risk score (Arnett DK, et al., 2019) is: 8.9%    Assessment & Plan:  Depression with anxiety -     Ambulatory referral to Psychiatry  Peripheral edema Assessment & Plan: Lasix  20 mg daily and KCL 10 meq daily.  Orders: -     Furosemide ; Take 1  tablet (20 mg total) by mouth daily.  Dispense: 30 tablet; Refill: 3 -     Potassium Chloride  [facility] ER; Take 1 tablet (10 mEq total) by mouth daily.  Dispense: 30 tablet; Refill: 3  Pain of right hip -     DG Pelvis 1-2 Views; Future     Return in about 1 week (around 12/31/2023).    Tremayne Sheldon K Anniece Bleiler, MD

## 2023-12-24 NOTE — Assessment & Plan Note (Signed)
 Lasix  20 mg daily and KCL 10 meq daily.

## 2023-12-25 ENCOUNTER — Ambulatory Visit
Admission: RE | Admit: 2023-12-25 | Discharge: 2023-12-25 | Disposition: A | Discharge status: Home / Self Care | Source: Ambulatory Visit | Attending: Family Medicine | Admitting: Family Medicine

## 2023-12-25 DIAGNOSIS — M25551 Pain in right hip: Secondary | ICD-10-CM | POA: Insufficient documentation

## 2023-12-25 DIAGNOSIS — Z96641 Presence of right artificial hip joint: Secondary | ICD-10-CM | POA: Diagnosis not present

## 2023-12-26 NOTE — Assessment & Plan Note (Signed)
 Advised that I woud be more willing to write her MSIR if she were on a lower dosage.

## 2023-12-26 NOTE — Assessment & Plan Note (Signed)
 Continue taking mirtazapine  and Cymbalta .  Referral to psychiatry for anxiety and depression.  She is not a candidate for benzodiazepines because she is on morphine  sulfate.

## 2023-12-28 ENCOUNTER — Inpatient Hospital Stay
Admission: EM | Admit: 2023-12-28 | Discharge: 2024-01-02 | DRG: 175 | Disposition: A | Discharge status: Home Health | Attending: Obstetrics and Gynecology | Admitting: Obstetrics and Gynecology

## 2023-12-28 ENCOUNTER — Other Ambulatory Visit: Payer: Self-pay

## 2023-12-28 DIAGNOSIS — J441 Chronic obstructive pulmonary disease with (acute) exacerbation: Secondary | ICD-10-CM | POA: Diagnosis present

## 2023-12-28 DIAGNOSIS — E876 Hypokalemia: Secondary | ICD-10-CM | POA: Diagnosis not present

## 2023-12-28 DIAGNOSIS — B9789 Other viral agents as the cause of diseases classified elsewhere: Secondary | ICD-10-CM | POA: Diagnosis not present

## 2023-12-28 DIAGNOSIS — G40919 Epilepsy, unspecified, intractable, without status epilepticus: Secondary | ICD-10-CM | POA: Diagnosis not present

## 2023-12-28 DIAGNOSIS — J9622 Acute and chronic respiratory failure with hypercapnia: Secondary | ICD-10-CM | POA: Diagnosis not present

## 2023-12-28 DIAGNOSIS — I2693 Single subsegmental pulmonary embolism without acute cor pulmonale: Principal | ICD-10-CM | POA: Diagnosis present

## 2023-12-28 DIAGNOSIS — G8929 Other chronic pain: Secondary | ICD-10-CM | POA: Diagnosis present

## 2023-12-28 DIAGNOSIS — Z85828 Personal history of other malignant neoplasm of skin: Secondary | ICD-10-CM

## 2023-12-28 DIAGNOSIS — F17210 Nicotine dependence, cigarettes, uncomplicated: Secondary | ICD-10-CM | POA: Diagnosis not present

## 2023-12-28 DIAGNOSIS — R918 Other nonspecific abnormal finding of lung field: Secondary | ICD-10-CM | POA: Diagnosis not present

## 2023-12-28 DIAGNOSIS — J9601 Acute respiratory failure with hypoxia: Secondary | ICD-10-CM | POA: Diagnosis not present

## 2023-12-28 DIAGNOSIS — I2699 Other pulmonary embolism without acute cor pulmonale: Secondary | ICD-10-CM | POA: Insufficient documentation

## 2023-12-28 DIAGNOSIS — Z881 Allergy status to other antibiotic agents status: Secondary | ICD-10-CM | POA: Diagnosis not present

## 2023-12-28 DIAGNOSIS — G40909 Epilepsy, unspecified, not intractable, without status epilepticus: Secondary | ICD-10-CM

## 2023-12-28 DIAGNOSIS — A09 Infectious gastroenteritis and colitis, unspecified: Secondary | ICD-10-CM | POA: Diagnosis present

## 2023-12-28 DIAGNOSIS — Z79899 Other long term (current) drug therapy: Secondary | ICD-10-CM | POA: Diagnosis not present

## 2023-12-28 DIAGNOSIS — E869 Volume depletion, unspecified: Secondary | ICD-10-CM | POA: Diagnosis not present

## 2023-12-28 DIAGNOSIS — R0689 Other abnormalities of breathing: Secondary | ICD-10-CM | POA: Diagnosis not present

## 2023-12-28 DIAGNOSIS — R531 Weakness: Secondary | ICD-10-CM | POA: Diagnosis not present

## 2023-12-28 DIAGNOSIS — Z882 Allergy status to sulfonamides status: Secondary | ICD-10-CM

## 2023-12-28 DIAGNOSIS — K861 Other chronic pancreatitis: Secondary | ICD-10-CM | POA: Diagnosis not present

## 2023-12-28 DIAGNOSIS — E785 Hyperlipidemia, unspecified: Secondary | ICD-10-CM | POA: Diagnosis present

## 2023-12-28 DIAGNOSIS — J9611 Chronic respiratory failure with hypoxia: Secondary | ICD-10-CM | POA: Diagnosis present

## 2023-12-28 DIAGNOSIS — Z79891 Long term (current) use of opiate analgesic: Secondary | ICD-10-CM | POA: Diagnosis not present

## 2023-12-28 DIAGNOSIS — Z8249 Family history of ischemic heart disease and other diseases of the circulatory system: Secondary | ICD-10-CM

## 2023-12-28 DIAGNOSIS — Z981 Arthrodesis status: Secondary | ICD-10-CM

## 2023-12-28 DIAGNOSIS — R17 Unspecified jaundice: Secondary | ICD-10-CM | POA: Diagnosis not present

## 2023-12-28 DIAGNOSIS — F1910 Other psychoactive substance abuse, uncomplicated: Secondary | ICD-10-CM

## 2023-12-28 DIAGNOSIS — G9341 Metabolic encephalopathy: Secondary | ICD-10-CM | POA: Diagnosis not present

## 2023-12-28 DIAGNOSIS — M069 Rheumatoid arthritis, unspecified: Secondary | ICD-10-CM | POA: Diagnosis not present

## 2023-12-28 DIAGNOSIS — R7989 Other specified abnormal findings of blood chemistry: Secondary | ICD-10-CM | POA: Diagnosis present

## 2023-12-28 DIAGNOSIS — Z91148 Patient's other noncompliance with medication regimen for other reason: Secondary | ICD-10-CM

## 2023-12-28 DIAGNOSIS — J45909 Unspecified asthma, uncomplicated: Secondary | ICD-10-CM | POA: Diagnosis present

## 2023-12-28 DIAGNOSIS — R4182 Altered mental status, unspecified: Secondary | ICD-10-CM

## 2023-12-28 DIAGNOSIS — R627 Adult failure to thrive: Secondary | ICD-10-CM | POA: Diagnosis present

## 2023-12-28 DIAGNOSIS — R0989 Other specified symptoms and signs involving the circulatory and respiratory systems: Secondary | ICD-10-CM | POA: Diagnosis not present

## 2023-12-28 DIAGNOSIS — J9621 Acute and chronic respiratory failure with hypoxia: Secondary | ICD-10-CM | POA: Diagnosis not present

## 2023-12-28 DIAGNOSIS — R609 Edema, unspecified: Secondary | ICD-10-CM | POA: Diagnosis not present

## 2023-12-28 DIAGNOSIS — G9340 Encephalopathy, unspecified: Secondary | ICD-10-CM | POA: Diagnosis not present

## 2023-12-28 DIAGNOSIS — J449 Chronic obstructive pulmonary disease, unspecified: Secondary | ICD-10-CM | POA: Insufficient documentation

## 2023-12-28 DIAGNOSIS — Z91199 Patient's noncompliance with other medical treatment and regimen due to unspecified reason: Secondary | ICD-10-CM | POA: Diagnosis not present

## 2023-12-28 DIAGNOSIS — E039 Hypothyroidism, unspecified: Secondary | ICD-10-CM | POA: Diagnosis present

## 2023-12-28 DIAGNOSIS — F329 Major depressive disorder, single episode, unspecified: Secondary | ICD-10-CM | POA: Diagnosis present

## 2023-12-28 DIAGNOSIS — Z90710 Acquired absence of both cervix and uterus: Secondary | ICD-10-CM

## 2023-12-28 DIAGNOSIS — J9602 Acute respiratory failure with hypercapnia: Secondary | ICD-10-CM | POA: Diagnosis not present

## 2023-12-28 NOTE — ED Provider Notes (Signed)
 [facility] Provider Note    Event Date/Time   First MD Initiated Contact with Patient 12/28/23 2339     (approximate)   History   Weakness  Level 5 caveat:  history/ROS limited by acute/critical illness  HPI Abigail [name] is a 58 y.o. female  with extensive chronic medical issues including chronic lung disease, chronic pancreatitis, intractable epilepsy with breakthrough seizures (treated by Dr. Mason Sole), medication noncompliance, etc.  Presents by EMS for generalized weakness and confusion.  Mother has not been able to get in touch with her for two days.  Nephew or cousin arrived from [place]  and checked on the patient.  She was in a different room than her oxygen, confused, did not recognize the family member.  Adult who is supposed to be living with her and caring for her has apparently not been around for a couple of days.  Unclear if she has been taking medications, eating or drinking, etc.  Patient unable or unwilling to provide any history.  She will open her eyes spontaneously and look around, answer yes or no to my questions, but not provide any additional detail.  She says no when asked if she has any pain.  She hesitates and does not really answer when asked if she feels short of breath.  Otherwise she will not talk to me.     Physical Exam   ED Triage Vitals  Encounter Vitals Group     BP 12/28/23 2353 123/89     Systolic BP Percentile --      Diastolic BP Percentile --      Pulse Rate 12/28/23 2345 (!) 102     Resp 12/28/23 2345 16     Temp 12/28/23 2353 98.8 F (37.1 C)     Temp Source 12/28/23 2353 Axillary     SpO2 12/28/23 2345 99 %     Weight 12/28/23 2352 59.9 kg (132 lb 0.9 oz)     Height 12/28/23 2352 1.727 m (5\' 8" )     Head Circumference --      Peak Flow --      Pain Score 12/28/23 2349 8     Pain Loc --      Pain Education --      Exclude from Growth Chart --       Most recent vital signs: Vitals:   12/28/23  2349 12/28/23 2353  BP:  123/89  Pulse:    Resp:    Temp:  98.8 F (37.1 C)  SpO2: 100%     General: Appears chronically ill.  Awake but not fully alert.  No current distress but ill-appearing. CV:  Good peripheral perfusion.  Mild tachycardia, normal heart sounds.  Regular rhythm. Resp:  Normal effort. Speaking easily and comfortably, no accessory muscle usage nor intercostal retractions.  Lungs are clear to auscultation.  She is currently on supplementary oxygen. Abd:  No distention.  No tenderness to palpation of the abdomen. Other:  Cannot or will not participate in neurological exam.  Patient appears malnourished.  Patient is very pale.  She has some chronic and noninfected appearing wounds on her lower extremities.  She is moving all 4 extremities and has no obvious facial droop.   ED Results / Procedures / Treatments   Labs (all labs ordered are listed, but only abnormal results are displayed) Labs Reviewed  COMPREHENSIVE METABOLIC PANEL WITH GFR - Abnormal; Notable for the following components:      Result Value  Potassium 2.8 (*)    Chloride 95 (*)    CO2 33 (*)    Glucose, Bld 124 (*)    Calcium  8.2 (*)    Albumin 2.0 (*)    All other components within normal limits  CBC WITH DIFFERENTIAL/PLATELET - Abnormal; Notable for the following components:   RBC 3.80 (*)    MCV 100.5 (*)    All other components within normal limits  URINALYSIS, W/ REFLEX TO CULTURE (INFECTION SUSPECTED) - Abnormal; Notable for the following components:   Color, Urine AMBER (*)    APPearance CLEAR (*)    Ketones, ur 5 (*)    Protein, ur 30 (*)    Bacteria, UA RARE (*)    All other components within normal limits  BLOOD GAS, VENOUS - Abnormal; Notable for the following components:   pCO2, Ven 76 (*)    Bicarbonate 36.5 (*)    Acid-Base Excess 7.1 (*)    All other components within normal limits  URINE DRUG SCREEN, QUALITATIVE ([facility] ONLY) - Abnormal; Notable for the following components:    Tricyclic, Ur Screen POSITIVE (*)    Opiate, Ur Screen POSITIVE (*)    Cannabinoid 50 Ng, Ur [place] POSITIVE (*)    All other components within normal limits  TROPONIN I (HIGH SENSITIVITY) - Abnormal; Notable for the following components:   Troponin I (High Sensitivity) 49 (*)    All other components within normal limits  CULTURE, BLOOD (SINGLE)  LACTIC ACID, PLASMA  PROTIME-INR  LIPASE, BLOOD  MAGNESIUM      EKG  ED ECG REPORT I, Lynnda Sas, the attending physician, personally viewed and interpreted this ECG.  Date: 12/28/2023 EKG Time: 23: 42 Rate: 96 Rhythm: normal sinus rhythm QRS Axis: normal Intervals: normal ST/T Wave abnormalities: Non-specific ST segment / T-wave changes, but no clear evidence of acute ischemia. Narrative Interpretation: no definitive evidence of acute ischemia; does not meet STEMI criteria.    RADIOLOGY See ED course for details   PROCEDURES:  Critical Care performed: Yes, see critical care procedure note(s)  .1-3 Lead EKG Interpretation  Performed by: Lynnda Sas, MD Authorized by: Lynnda Sas, MD     Interpretation: abnormal     ECG rate:  102   ECG rate assessment: tachycardic     Rhythm: sinus tachycardia     Ectopy: none     Conduction: normal   .Critical Care  Performed by: Lynnda Sas, MD Authorized by: Lynnda Sas, MD   Critical care provider statement:    Critical care time (minutes):  30   Critical care time was exclusive of:  Separately billable procedures and treating other patients   Critical care was necessary to treat or prevent imminent or life-threatening deterioration of the following conditions:  Respiratory failure   Critical care was time spent personally by me on the following activities:  Development of treatment plan with patient or surrogate, evaluation of patient's response to treatment, examination of patient, obtaining history from patient or surrogate, ordering and performing treatments and  interventions, ordering and review of laboratory studies, ordering and review of radiographic studies, pulse oximetry, re-evaluation of patient's condition and review of old charts     IMPRESSION / MDM / ASSESSMENT AND PLAN / ED COURSE  I reviewed the triage vital signs and the nursing notes.                              Differential diagnosis includes,  but is not limited to, generalized failure to thrive, renal failure/dehydration, other electrolyte or metabolic abnormality, UTI, pneumonia, medication or drug side effect.  Patient's presentation is most consistent with acute presentation with potential threat to life or bodily function.  Labs/studies ordered: I ordered standard sepsis labs/studies including the following: respiratory viral panel PCR swab, blood cultures x2, pro time-INR, CMP, urinalysis, urine culture, lactic acid, CBC with differential, high-sensitivity troponin, lipase, 1-view chest x-ray, EKG. also ordered VBG and urine drug screen given the altered mental status.  Interventions/Medications given:  Medications  potassium chloride  SA (KLOR-CON  M) CR tablet 40 mEq (has no administration in time range)  potassium chloride  10 mEq in 100 mL IVPB (has no administration in time range)  lactated ringers  bolus 1,000 mL (1,000 mLs Intravenous New Bag/Given 12/29/23 0104)  ipratropium-albuterol  (DUONEB) 0.5-2.5 (3) MG/3ML nebulizer solution 3 mL (3 mLs Nebulization Given 12/29/23 0156)  methylPREDNISolone  sodium succinate (SOLU-MEDROL ) 125 mg/2 mL injection 125 mg (125 mg Intravenous Given 12/29/23 0157)    (Note:  [name] course my include additional interventions and/or labs/studies not listed above.)   Failure to thrive and chronic ill appearance, no acute distress now but clearly confused.  I suspect we will find renal dysfunction.  Initiating "possible sepsis" workup.  Awaiting chest x-ray but will hold off on additional imaging until more labs are available.  LR 1 L bolus  for apparent volume depletion and mild tachycardia.  I reviewed her medical records including a family medicine note written by Dr. [name] during an office visit on 12/24/2023.  It sounds as if the patient has declined even since then but Dr. [name] discussed the patient's many chronic medical issues, general decline, depression, referral to psychiatry, etc.  She takes quite a bit of chronic pain medication, MS IR 15 mg 4-5 times a day which equals 150 MS IR per month.  The patient is on the cardiac monitor to evaluate for evidence of arrhythmia and/or significant heart rate changes.   Clinical Course as of 12/29/23 0225  Sun Dec 29, 2023  0106 Blood gas, venous(!!) Patient's PCO2 of 76.  This likely explains at least a degree of her altered mental status and somnolence.  I updated the patient's family and we will start her on BiPAP as well as treating for COPD exacerbation with Solu-Medrol  125 mg IV and 3 DuoNebs inline with the BiPAP [CF]  0111 DG Chest Port 1 View I independently viewed and interpreted the patient's 1 view chest x-ray.  I see no lobar pneumonia nor pneumothorax.  Radiology confirmed chronic findings but no acute abnormalities. [CF]  0213 Tricyclics, opiates, and cannabinoids all positive.  Metabolic panel notable for potassium of 2.8.  I changed to only 1 DuoNeb until the patient could get some more potassium repletion so we do not drop it too low. [CF]  0214 No obvious source of infection which is reassuring.  I consulted the hospitalist team for admission for acute respiratory failure with hypoxia and hypercapnia. [CF]  1610 Consulted with Dr. Vallarie Gauze with the hospitalist service who will admit the patient. [CF]    Clinical Course User Index [CF] Lynnda Sas, MD     FINAL CLINICAL IMPRESSION(S) / ED DIAGNOSES   Final diagnoses:  Acute respiratory failure with hypoxia and hypercapnia (HCC)  Failure to thrive in adult  Polysubstance abuse (HCC)  Altered mental status,  unspecified altered mental status type     Rx / DC Orders   ED Discharge Orders  None        Note:  This document was prepared using Dragon voice recognition software and may include unintentional dictation errors.   Lynnda Sas, MD 12/29/23 [phone]

## 2023-12-28 NOTE — ED Triage Notes (Addendum)
 Per EMS family called them out to the house after having a decline in health, Failure to thrive since Thursday. EMS reports pt is somewhat altered and she is usually on 2 liters of O2 but she was sating in the 70s but her o2 tank was not in the same room as the pt. Pt was ;placed on 4 liters of O2 which increased sat level to 98%. EMS also sts she was referred to psych recently for hx of depression.

## 2023-12-29 ENCOUNTER — Observation Stay

## 2023-12-29 ENCOUNTER — Emergency Department

## 2023-12-29 DIAGNOSIS — Z91199 Patient's noncompliance with other medical treatment and regimen due to unspecified reason: Secondary | ICD-10-CM | POA: Diagnosis not present

## 2023-12-29 DIAGNOSIS — F329 Major depressive disorder, single episode, unspecified: Secondary | ICD-10-CM | POA: Diagnosis present

## 2023-12-29 DIAGNOSIS — J90 Pleural effusion, not elsewhere classified: Secondary | ICD-10-CM | POA: Diagnosis not present

## 2023-12-29 DIAGNOSIS — J9621 Acute and chronic respiratory failure with hypoxia: Secondary | ICD-10-CM | POA: Diagnosis present

## 2023-12-29 DIAGNOSIS — J9622 Acute and chronic respiratory failure with hypercapnia: Secondary | ICD-10-CM | POA: Diagnosis not present

## 2023-12-29 DIAGNOSIS — G40919 Epilepsy, unspecified, intractable, without status epilepticus: Secondary | ICD-10-CM | POA: Diagnosis present

## 2023-12-29 DIAGNOSIS — J441 Chronic obstructive pulmonary disease with (acute) exacerbation: Secondary | ICD-10-CM | POA: Diagnosis present

## 2023-12-29 DIAGNOSIS — R0989 Other specified symptoms and signs involving the circulatory and respiratory systems: Secondary | ICD-10-CM | POA: Diagnosis not present

## 2023-12-29 DIAGNOSIS — J449 Chronic obstructive pulmonary disease, unspecified: Secondary | ICD-10-CM | POA: Insufficient documentation

## 2023-12-29 DIAGNOSIS — M069 Rheumatoid arthritis, unspecified: Secondary | ICD-10-CM | POA: Diagnosis present

## 2023-12-29 DIAGNOSIS — E785 Hyperlipidemia, unspecified: Secondary | ICD-10-CM | POA: Diagnosis present

## 2023-12-29 DIAGNOSIS — Z881 Allergy status to other antibiotic agents status: Secondary | ICD-10-CM | POA: Diagnosis not present

## 2023-12-29 DIAGNOSIS — G9341 Metabolic encephalopathy: Secondary | ICD-10-CM | POA: Diagnosis present

## 2023-12-29 DIAGNOSIS — F17210 Nicotine dependence, cigarettes, uncomplicated: Secondary | ICD-10-CM | POA: Diagnosis present

## 2023-12-29 DIAGNOSIS — B9789 Other viral agents as the cause of diseases classified elsewhere: Secondary | ICD-10-CM | POA: Diagnosis present

## 2023-12-29 DIAGNOSIS — Z91148 Patient's other noncompliance with medication regimen for other reason: Secondary | ICD-10-CM | POA: Diagnosis not present

## 2023-12-29 DIAGNOSIS — R7989 Other specified abnormal findings of blood chemistry: Secondary | ICD-10-CM | POA: Diagnosis present

## 2023-12-29 DIAGNOSIS — A09 Infectious gastroenteritis and colitis, unspecified: Secondary | ICD-10-CM | POA: Diagnosis present

## 2023-12-29 DIAGNOSIS — J9811 Atelectasis: Secondary | ICD-10-CM | POA: Diagnosis not present

## 2023-12-29 DIAGNOSIS — Z79899 Other long term (current) drug therapy: Secondary | ICD-10-CM | POA: Diagnosis not present

## 2023-12-29 DIAGNOSIS — K861 Other chronic pancreatitis: Secondary | ICD-10-CM | POA: Diagnosis not present

## 2023-12-29 DIAGNOSIS — E876 Hypokalemia: Secondary | ICD-10-CM | POA: Diagnosis present

## 2023-12-29 DIAGNOSIS — R918 Other nonspecific abnormal finding of lung field: Secondary | ICD-10-CM | POA: Diagnosis not present

## 2023-12-29 DIAGNOSIS — I2693 Single subsegmental pulmonary embolism without acute cor pulmonale: Secondary | ICD-10-CM | POA: Diagnosis not present

## 2023-12-29 DIAGNOSIS — G8929 Other chronic pain: Secondary | ICD-10-CM | POA: Diagnosis present

## 2023-12-29 DIAGNOSIS — G9340 Encephalopathy, unspecified: Secondary | ICD-10-CM | POA: Diagnosis not present

## 2023-12-29 DIAGNOSIS — R627 Adult failure to thrive: Secondary | ICD-10-CM | POA: Diagnosis not present

## 2023-12-29 DIAGNOSIS — Z85828 Personal history of other malignant neoplasm of skin: Secondary | ICD-10-CM | POA: Diagnosis not present

## 2023-12-29 DIAGNOSIS — Z79891 Long term (current) use of opiate analgesic: Secondary | ICD-10-CM | POA: Diagnosis not present

## 2023-12-29 DIAGNOSIS — E869 Volume depletion, unspecified: Secondary | ICD-10-CM | POA: Diagnosis present

## 2023-12-29 DIAGNOSIS — Z981 Arthrodesis status: Secondary | ICD-10-CM | POA: Diagnosis not present

## 2023-12-29 DIAGNOSIS — I2699 Other pulmonary embolism without acute cor pulmonale: Secondary | ICD-10-CM | POA: Diagnosis not present

## 2023-12-29 LAB — BLOOD GAS, VENOUS
Acid-Base Excess: 7.1 mmol/L — ABNORMAL HIGH (ref 0.0–2.0)
Acid-Base Excess: 9.6 mmol/L — ABNORMAL HIGH (ref 0.0–2.0)
Bicarbonate: 35.7 mmol/L — ABNORMAL HIGH (ref 20.0–28.0)
Bicarbonate: 36.5 mmol/L — ABNORMAL HIGH (ref 20.0–28.0)
O2 Saturation: 54.8 %
O2 Saturation: 67.2 %
Patient temperature: 37
Patient temperature: 37
pCO2, Ven: 55 mmHg (ref 44–60)
pCO2, Ven: 76 mmHg (ref 44–60)
pH, Ven: 7.29 (ref 7.25–7.43)
pH, Ven: 7.42 (ref 7.25–7.43)
pO2, Ven: 36 mmHg (ref 32–45)
pO2, Ven: 41 mmHg (ref 32–45)

## 2023-12-29 LAB — RESPIRATORY PANEL BY PCR

## 2023-12-29 LAB — GASTROINTESTINAL PANEL BY PCR, STOOL (REPLACES STOOL CULTURE)

## 2023-12-29 LAB — CBC WITH DIFFERENTIAL/PLATELET
Abs Immature Granulocytes: 0.06 10*3/uL (ref 0.00–0.07)
Basophils Absolute: 0 10*3/uL (ref 0.0–0.1)
Basophils Relative: 0 %
Eosinophils Absolute: 0 10*3/uL (ref 0.0–0.5)
Eosinophils Relative: 0 %
HCT: 38.2 % (ref 36.0–46.0)
Hemoglobin: 12.2 g/dL (ref 12.0–15.0)
Immature Granulocytes: 1 %
Lymphocytes Relative: 9 %
Lymphs Abs: 0.7 10*3/uL (ref 0.7–4.0)
MCH: 32.1 pg (ref 26.0–34.0)
MCHC: 31.9 g/dL (ref 30.0–36.0)
MCV: 100.5 fL — ABNORMAL HIGH (ref 80.0–100.0)
Monocytes Absolute: 0.7 10*3/uL (ref 0.1–1.0)
Monocytes Relative: 8 %
Neutro Abs: 6.7 10*3/uL (ref 1.7–7.7)
Neutrophils Relative %: 82 %
Platelets: 247 10*3/uL (ref 150–400)
RBC: 3.8 MIL/uL — ABNORMAL LOW (ref 3.87–5.11)
RDW: 14 % (ref 11.5–15.5)
WBC: 8.2 10*3/uL (ref 4.0–10.5)
nRBC: 0 % (ref 0.0–0.2)

## 2023-12-29 LAB — URINE DRUG SCREEN, QUALITATIVE (ARMC ONLY)
Amphetamines, Ur Screen: NOT DETECTED
Barbiturates, Ur Screen: NOT DETECTED
Benzodiazepine, Ur Scrn: NOT DETECTED
Cannabinoid 50 Ng, Ur ~~LOC~~: POSITIVE — AB
Cocaine Metabolite,Ur ~~LOC~~: NOT DETECTED
MDMA (Ecstasy)Ur Screen: NOT DETECTED
Methadone Scn, Ur: NOT DETECTED
Opiate, Ur Screen: POSITIVE — AB
Phencyclidine (PCP) Ur S: NOT DETECTED
Tricyclic, Ur Screen: POSITIVE — AB

## 2023-12-29 LAB — COMPREHENSIVE METABOLIC PANEL WITH GFR
ALT: 13 U/L (ref 0–44)
AST: 29 U/L (ref 15–41)
Albumin: 2 g/dL — ABNORMAL LOW (ref 3.5–5.0)
Alkaline Phosphatase: 118 U/L (ref 38–126)
Anion gap: 11 (ref 5–15)
BUN: 12 mg/dL (ref 6–20)
CO2: 33 mmol/L — ABNORMAL HIGH (ref 22–32)
Calcium: 8.2 mg/dL — ABNORMAL LOW (ref 8.9–10.3)
Chloride: 95 mmol/L — ABNORMAL LOW (ref 98–111)
Creatinine, Ser: 0.5 mg/dL (ref 0.44–1.00)
GFR, Estimated: >60 mL/min (ref >60)
Glucose, Bld: 124 mg/dL — ABNORMAL HIGH (ref 70–99)
Potassium: 2.8 mmol/L — ABNORMAL LOW (ref 3.5–5.1)
Sodium: 139 mmol/L (ref 135–145)
Total Bilirubin: 0.8 mg/dL (ref 0.0–1.2)
Total Protein: 6.7 g/dL (ref 6.5–8.1)

## 2023-12-29 LAB — URINALYSIS, W/ REFLEX TO CULTURE (INFECTION SUSPECTED)
Bilirubin Urine: NEGATIVE
Glucose, UA: NEGATIVE mg/dL
Hgb urine dipstick: NEGATIVE
Ketones, ur: 5 mg/dL — AB
Leukocytes,Ua: NEGATIVE
Nitrite: NEGATIVE
Protein, ur: 30 mg/dL — AB
Specific Gravity, Urine: 1.021 (ref 1.005–1.030)
pH: 6 (ref 5.0–8.0)

## 2023-12-29 LAB — LACTIC ACID, PLASMA: Lactic Acid, Venous: 1.2 mmol/L (ref 0.5–1.9)

## 2023-12-29 LAB — TROPONIN I (HIGH SENSITIVITY)
Troponin I (High Sensitivity): 49 ng/L — ABNORMAL HIGH (ref <18)
Troponin I (High Sensitivity): 37 ng/L — ABNORMAL HIGH (ref <18)

## 2023-12-29 LAB — HEPARIN LEVEL (UNFRACTIONATED): Heparin Unfractionated: 0.52 [IU]/mL (ref 0.30–0.70)

## 2023-12-29 LAB — LIPASE, BLOOD: Lipase: 51 U/L (ref 11–51)

## 2023-12-29 LAB — HIV ANTIBODY (ROUTINE TESTING W REFLEX): HIV Screen 4th Generation wRfx: NONREACTIVE

## 2023-12-29 LAB — MAGNESIUM: Magnesium: 2.2 mg/dL (ref 1.7–2.4)

## 2023-12-29 LAB — BASIC METABOLIC PANEL WITH GFR
Anion gap: 9 (ref 5–15)
BUN: 12 mg/dL (ref 6–20)
CO2: 29 mmol/L (ref 22–32)
Calcium: 7.5 mg/dL — ABNORMAL LOW (ref 8.9–10.3)
Chloride: 96 mmol/L — ABNORMAL LOW (ref 98–111)
Creatinine, Ser: 0.41 mg/dL — ABNORMAL LOW (ref 0.44–1.00)
GFR, Estimated: >60 mL/min (ref >60)
Glucose, Bld: 115 mg/dL — ABNORMAL HIGH (ref 70–99)
Potassium: 3.6 mmol/L (ref 3.5–5.1)
Sodium: 134 mmol/L — ABNORMAL LOW (ref 135–145)

## 2023-12-29 LAB — C DIFFICILE QUICK SCREEN W PCR REFLEX
C Diff antigen: NEGATIVE
C Diff interpretation: NOT DETECTED
C Diff toxin: NEGATIVE

## 2023-12-29 LAB — PROTIME-INR
INR: 1 (ref 0.8–1.2)
Prothrombin Time: 13.4 s (ref 11.4–15.2)

## 2023-12-29 LAB — CARBAMAZEPINE LEVEL, TOTAL: Carbamazepine Lvl: <2 ug/mL — ABNORMAL LOW (ref 4.0–12.0)

## 2023-12-29 LAB — SARS CORONAVIRUS 2 BY RT PCR: SARS Coronavirus 2 by RT PCR: NEGATIVE

## 2023-12-29 LAB — TSH: TSH: 1.26 u[IU]/mL (ref 0.350–4.500)

## 2023-12-29 MED ORDER — ORAL CARE MOUTH RINSE
15.0000 mL | OROMUCOSAL | Status: DC
  Administered 2023-12-30 – 2023-12-31 (×6): 15 mL via OROMUCOSAL
  Filled 2023-12-29 (×21): qty 15

## 2023-12-29 MED ORDER — MIRTAZAPINE 15 MG PO TABS
30.0000 mg | ORAL_TABLET | Freq: Every day | ORAL | Status: DC
  Filled 2023-12-29: qty 2

## 2023-12-29 MED ORDER — POTASSIUM CHLORIDE ER 10 MEQ PO TBCR
40.0000 meq | EXTENDED_RELEASE_TABLET | Freq: Once | ORAL | Status: AC
  Administered 2023-12-29: 40 meq via ORAL
  Filled 2023-12-29: qty 4

## 2023-12-29 MED ORDER — CARBAMAZEPINE ER 100 MG PO TB12
100.0000 mg | ORAL_TABLET | Freq: Two times a day (BID) | ORAL | Status: DC
  Administered 2023-12-29 – 2024-01-02 (×9): 100 mg via ORAL
  Filled 2023-12-29 (×11): qty 1

## 2023-12-29 MED ORDER — LACTATED RINGERS IV BOLUS
1000.0000 mL | Freq: Once | INTRAVENOUS | Status: AC
  Administered 2023-12-29: 1000 mL via INTRAVENOUS

## 2023-12-29 MED ORDER — ORAL CARE MOUTH RINSE: 15.0000 mL | OROMUCOSAL | Status: DC | PRN

## 2023-12-29 MED ORDER — LEVETIRACETAM 500 MG PO TABS
1500.0000 mg | ORAL_TABLET | Freq: Two times a day (BID) | ORAL | Status: DC
  Filled 2023-12-29: qty 3

## 2023-12-29 MED ORDER — MORPHINE SULFATE 15 MG PO TABS
15.0000 mg | ORAL_TABLET | ORAL | Status: DC | PRN
  Administered 2023-12-31 (×2): 15 mg via ORAL
  Filled 2023-12-29 (×2): qty 1

## 2023-12-29 MED ORDER — ACETAMINOPHEN 650 MG RE SUPP
650.0000 mg | RECTAL | Status: DC | PRN
  Filled 2023-12-29: qty 2

## 2023-12-29 MED ORDER — LAMOTRIGINE 100 MG PO TABS
50.0000 mg | ORAL_TABLET | Freq: Two times a day (BID) | ORAL | Status: DC
  Administered 2023-12-29 – 2024-01-02 (×9): 50 mg via ORAL
  Filled 2023-12-29 (×2): qty 1
  Filled 2023-12-29 (×3): qty 2
  Filled 2023-12-29 (×4): qty 1
  Filled 2023-12-29: qty 2

## 2023-12-29 MED ORDER — NALOXONE HCL 2 MG/2ML IJ SOSY
0.4000 mg | PREFILLED_SYRINGE | INTRAMUSCULAR | Status: DC | PRN
  Administered 2023-12-29: 0.4 mg via INTRAVENOUS
  Filled 2023-12-29: qty 2

## 2023-12-29 MED ORDER — METHYLPREDNISOLONE SODIUM SUCC 125 MG IJ SOLR
125.0000 mg | Freq: Once | INTRAMUSCULAR | Status: AC
  Administered 2023-12-29: 125 mg via INTRAVENOUS
  Filled 2023-12-29: qty 2

## 2023-12-29 MED ORDER — BISACODYL 5 MG PO TBEC: 5.0000 mg | DELAYED_RELEASE_TABLET | Freq: Every day | ORAL | Status: DC | PRN

## 2023-12-29 MED ORDER — SODIUM CHLORIDE 0.9 % IV SOLN
200.0000 mg | Freq: Once | INTRAVENOUS | Status: AC
  Administered 2023-12-29: 200 mg via INTRAVENOUS
  Filled 2023-12-29: qty 20

## 2023-12-29 MED ORDER — ONDANSETRON HCL 4 MG/2ML IJ SOLN
4.0000 mg | Freq: Four times a day (QID) | INTRAMUSCULAR | Status: DC | PRN
  Administered 2023-12-29: 4 mg via INTRAVENOUS
  Filled 2023-12-29: qty 2

## 2023-12-29 MED ORDER — HEPARIN BOLUS VIA INFUSION
2000.0000 [IU] | Freq: Once | INTRAVENOUS | Status: AC
  Administered 2023-12-29: 2000 [IU] via INTRAVENOUS
  Filled 2023-12-29: qty 2000

## 2023-12-29 MED ORDER — ACETAMINOPHEN 325 MG RE SUPP: 650.0000 mg | RECTAL | Status: DC | PRN

## 2023-12-29 MED ORDER — PANTOPRAZOLE SODIUM 40 MG PO TBEC: 40.0000 mg | DELAYED_RELEASE_TABLET | Freq: Every day | ORAL | Status: DC

## 2023-12-29 MED ORDER — LORAZEPAM 2 MG/ML IJ SOLN: 1.0000 mg | INTRAMUSCULAR | Status: DC | PRN

## 2023-12-29 MED ORDER — ACETAMINOPHEN 325 MG PO TABS
650.0000 mg | ORAL_TABLET | ORAL | Status: DC | PRN
  Administered 2023-12-29 – 2023-12-30 (×2): 650 mg via ORAL
  Filled 2023-12-29 (×2): qty 2

## 2023-12-29 MED ORDER — IPRATROPIUM-ALBUTEROL 0.5-2.5 (3) MG/3ML IN SOLN
3.0000 mL | Freq: Once | RESPIRATORY_TRACT | Status: AC
  Administered 2023-12-29: 3 mL via RESPIRATORY_TRACT
  Filled 2023-12-29: qty 3

## 2023-12-29 MED ORDER — IPRATROPIUM-ALBUTEROL 0.5-2.5 (3) MG/3ML IN SOLN: 3.0000 mL | Freq: Once | RESPIRATORY_TRACT | Status: DC

## 2023-12-29 MED ORDER — LAMOTRIGINE 100 MG PO TABS: 100.0000 mg | ORAL_TABLET | Freq: Two times a day (BID) | ORAL | Status: DC

## 2023-12-29 MED ORDER — ENOXAPARIN SODIUM 40 MG/0.4ML IJ SOSY
40.0000 mg | PREFILLED_SYRINGE | INTRAMUSCULAR | Status: DC
  Administered 2023-12-29: 40 mg via SUBCUTANEOUS
  Filled 2023-12-29: qty 0.4

## 2023-12-29 MED ORDER — MAGNESIUM CITRATE PO SOLN: 1.0000 | Freq: Once | ORAL | Status: DC | PRN

## 2023-12-29 MED ORDER — IPRATROPIUM-ALBUTEROL 0.5-2.5 (3) MG/3ML IN SOLN
3.0000 mL | Freq: Once | RESPIRATORY_TRACT | Status: DC
Start: 2023-12-29 — End: 2023-12-29

## 2023-12-29 MED ORDER — ACETAMINOPHEN 10 MG/ML IV SOLN
1000.0000 mg | Freq: Four times a day (QID) | INTRAVENOUS | Status: AC
  Administered 2023-12-29 – 2023-12-30 (×4): 1000 mg via INTRAVENOUS
  Filled 2023-12-29 (×5): qty 100

## 2023-12-29 MED ORDER — SODIUM CHLORIDE 0.9 % IV SOLN
50.0000 mg | Freq: Two times a day (BID) | INTRAVENOUS | Status: DC
  Administered 2023-12-29 – 2023-12-30 (×2): 50 mg via INTRAVENOUS
  Filled 2023-12-29 (×3): qty 5

## 2023-12-29 MED ORDER — LEVETIRACETAM (KEPPRA) 500 MG/5 ML ADULT IV PUSH
1500.0000 mg | Freq: Two times a day (BID) | INTRAVENOUS | Status: DC
  Administered 2023-12-29 – 2023-12-30 (×3): 1500 mg via INTRAVENOUS
  Filled 2023-12-29 (×3): qty 15

## 2023-12-29 MED ORDER — PANTOPRAZOLE SODIUM 40 MG IV SOLR
40.0000 mg | INTRAVENOUS | Status: DC
  Administered 2023-12-29 – 2023-12-31 (×3): 40 mg via INTRAVENOUS
  Filled 2023-12-29 (×3): qty 10

## 2023-12-29 MED ORDER — LEVETIRACETAM 500 MG PO TABS: 500.0000 mg | ORAL_TABLET | Freq: Two times a day (BID) | ORAL | Status: DC

## 2023-12-29 MED ORDER — HEPARIN (PORCINE) 25000 UT/250ML-% IV SOLN
1300.0000 [IU]/h | INTRAVENOUS | Status: DC
  Administered 2023-12-29: 1000 [IU]/h via INTRAVENOUS
  Administered 2023-12-30: 1300 [IU]/h via INTRAVENOUS
  Administered 2023-12-30: 1150 [IU]/h via INTRAVENOUS
  Filled 2023-12-29 (×2): qty 250

## 2023-12-29 MED ORDER — POTASSIUM CHLORIDE CRYS ER 20 MEQ PO TBCR
40.0000 meq | EXTENDED_RELEASE_TABLET | Freq: Once | ORAL | Status: DC
  Filled 2023-12-29: qty 2

## 2023-12-29 MED ORDER — IOHEXOL 300 MG/ML  SOLN
100.0000 mL | Freq: Once | INTRAMUSCULAR | Status: AC | PRN
  Administered 2023-12-29: 100 mL via INTRAVENOUS

## 2023-12-29 MED ORDER — ONDANSETRON HCL 4 MG PO TABS: 4.0000 mg | ORAL_TABLET | Freq: Four times a day (QID) | ORAL | Status: DC | PRN

## 2023-12-29 MED ORDER — POTASSIUM CHLORIDE 10 MEQ/100ML IV SOLN
10.0000 meq | INTRAVENOUS | Status: AC
  Administered 2023-12-29 (×6): 10 meq via INTRAVENOUS
  Filled 2023-12-29 (×5): qty 100

## 2023-12-29 NOTE — ED Notes (Signed)
 Pt diaper soiled with urine and BM. Pt cleaned, gown changed, linens changed.

## 2023-12-29 NOTE — ED Notes (Signed)
 Unable to tolerate oral temp versus unable to understand oral temp. Says, "OK", then opens her mouth, then pushes probe out with her tongue.

## 2023-12-29 NOTE — ED Notes (Signed)
 Patient denies pain and is resting comfortably.

## 2023-12-29 NOTE — Progress Notes (Signed)
 PHARMACY - ANTICOAGULATION CONSULT NOTE  Pharmacy Consult for heparin  infusion  Indication: pulmonary embolus  Allergies  Allergen Reactions   Nsaids Hives   Tapentadol Swelling, Rash and Other (See Comments)    Nucynta- Made her deathly sick   Cephalexin  Other (See Comments)    Reaction not cited   Codeine Other (See Comments)    Reaction not cited   Darvocet [Propoxyphene N-Acetaminophen ] Other (See Comments)    Reaction not cited   Latex Other (See Comments)    Reaction not cited   Silicone Other (See Comments)    Reaction not cited   Sulfa Antibiotics Hives   Tape Other (See Comments)    Reaction not cited   Meloxicam Rash    Patient Measurements: Height: 5\' 8"  (172.7 cm) Weight: 59.9 kg (132 lb 0.9 oz) IBW/kg (Calculated) : 63.9 HEPARIN  DW (KG): 59.9  Vital Signs: Temp: 98.3 F (36.8 C) (05/11 1857) Temp Source: Axillary (05/11 1857) BP: 122/73 (05/11 1800) Pulse Rate: 75 (05/11 1830)  Labs: Recent Labs    12/29/23 0020 12/29/23 1200 12/29/23 1842  HGB 12.2  --   --   HCT 38.2  --   --   PLT 247  --   --   LABPROT 13.4  --   --   INR 1.0  --   --   HEPARINUNFRC  --   --  0.Abigail  CREATININE 0.50 0.41*  --   TROPONINIHS 49* 37*  --     Estimated Creatinine Clearance: 72.5 mL/min (A) (by C-G formula based on SCr of 0.41 mg/dL (L)).   Medical History: Past Medical History:  Diagnosis Date   Anxiety    Arthritis    Back pain    Basal cell carcinoma 10/04/2021   R axilla - needs excised 11/28/21   Basal cell carcinoma 10/04/2021   L antecubital excised 11/14/21   Diarrhea 11/12/2016   Fibromyalgia    Generalized abdominal pain 11/12/2016   H. pylori infection    Hyperlipidemia    IBS (irritable bowel syndrome)    Infectious colitis 04/29/2016   Migraines    Moderate dehydration 04/29/2016   Muscle pain    Opioid overdose (HCC) 12/07/2022   Reflux    Seizures (HCC)    Unexplained weight loss 11/12/2016    Medications:  Scheduled:    carbamazepine   100 mg Oral BID   lamoTRIgine   50 mg Oral BID   levETIRAcetam   1,500 mg Intravenous Q12H   mirtazapine   30 mg Oral QHS   mouth rinse  15 mL Mouth Rinse Q2H   pantoprazole  (PROTONIX ) IV  40 mg Intravenous Q24H    Assessment: 58 yo [name] admitted with acute on chronic respiratory failure and acute encephalopathy.  CT Angio Chest positive for single subsegmental pulmonary embolism in the right lower lobe. Pharmacy has been consulted for heparin  dosing and monitoring. Patient did received enoxaparin  40mg  x 1 dose this morning @ 0930.   Goal of Therapy:  Heparin  level 0.3-0.7 units/ml Monitor platelets by anticoagulation protocol: Yes  0511 @ 1842: HL 0.Abigail = Therapeutic x 1   Plan:  Continue heparin  infusion at 1000 units/hr Check anti-Xa level in 6 hours and daily while on heparin  Continue to monitor H&H and platelets  Will M. Alva Jewels, PharmD Clinical Pharmacist 12/29/2023 7:02 PM

## 2023-12-29 NOTE — ED Notes (Signed)
 Sleeping, resting comfortably on bipap, NAD, calm.

## 2023-12-29 NOTE — ED Notes (Signed)
 Dr. Vallarie Gauze gave verbal order to keep pt NPO d/t pt having difficulty swallowing.

## 2023-12-29 NOTE — ED Notes (Signed)
 To CT

## 2023-12-29 NOTE — ED Notes (Addendum)
 HOB 45 degrees. [place] 4LPM. PO meds given, poor concentration, slow to act, slow to respond, slow to swallow.

## 2023-12-29 NOTE — Assessment & Plan Note (Addendum)
 Likely secondary to non-compliance with oxygen, polypharmacy No evidence of acute infection.  Had aspiration pneumonia back in March At baseline uses O2 as needed only Continue BiPAP and wean as tolerated

## 2023-12-29 NOTE — ED Notes (Signed)
 VBG obtained and sent, RT notified. Mother at [facility]. Pt sleeping on 2L [place]. VSS.

## 2023-12-29 NOTE — Assessment & Plan Note (Addendum)
 Polypharmacy versus seizure versus substance use versus medication noncompliance Uncertain etiology but several likely concurrent possibilities -Polypharmacy - multiple sedating meds including gabapentin , melatonin, mirtazapine , MS IR -Hypercapnia /CO2 narcosis -.possible seizure secondary to noncompliance (subtherapeutic levels at neurologist on 4/28) -Substance use given UDS positive for cannabinoids  Neurologic checks Fall and aspiration precautions Seizure precautions Resume home seizure meds and get levels of seizure meds Avoid sedating meds as much as possible Keep n.p.o. until more awake and alert Consider TOC consult

## 2023-12-29 NOTE — ED Notes (Addendum)
 De-satted down to 78% RA. Returned to [facility]. Pt did not resist. Interactive, poor concentration, answers some questions appropriately. NAD, calm and mily restless, passively cooperative. Poor effort. Unable to give PO meds.

## 2023-12-29 NOTE — ED Notes (Signed)
 Spontaneously arousable after narcan. Mildly restless. Verbal c/o pain. Now crying.

## 2023-12-29 NOTE — ED Notes (Signed)
 Back from CT. Remains on bipap. IVF and KCL run infusing. IV keppra , zoftran and acetaminophen  previously given. Pt returns to room with continual complaint of pain is worse, abd and back.

## 2023-12-29 NOTE — Consult Note (Signed)
 NEUROLOGY CONSULTATION NOTE   Date of service: Dec 29, 2023 Patient Name: [name] MRN:  [id number] DOB:  25-Jun-1966 Reason for consult: encephalopathy in patient with hx seizure Requesting physician: Dr. Hines Ludwig _ _ _   _ __   _ __ _ _  __ __   _ __   __ _  History of Present Illness   This is a 58 year old woman very well-known to our service with a history of intractable epilepsy with noncompliance with her outpatient regimen.  She presented with fever and encephalopathy and neurology was consulted to determine if she could possibly be in status epilepticus given her encephalopathy and to see if we recommended lumbar puncture for for their evaluation of the fever.  She was subsequently found to have both colitis as well as acute PE.  She also had hypercarbia on admission and is on BiPAP for this.  On my examination she is lethargic but arousable, oriented x 3, and following commands.  Her neurologic exam is nonfocal. She is f/b Dr. Mason Sole with outpatient neurology and is supposed to be taking Keppra  1500 twice daily, lamotrigine  50 twice daily, carbamazepine  100 twice daily.  She has been on lacosamide  in the past but self-discontinued 2/2 reported side effect of lethargy. At her last outpatient neurology visit on April 28 she was found to have subtherapeutic levels of Keppra , lamotrigine , and carbamazepine  likely secondary to medication noncompliance which is something that she has struggled with for years.  ROS   Per HPI: all other systems reviewed and are negative  Past History   I have reviewed the following:  Past Medical History:  Diagnosis Date   Anxiety    Arthritis    Back pain    Basal cell carcinoma 10/04/2021   R axilla - needs excised 11/28/21   Basal cell carcinoma 10/04/2021   L antecubital excised 11/14/21   Diarrhea 11/12/2016   Fibromyalgia    Generalized abdominal pain 11/12/2016   H. pylori infection    Hyperlipidemia    IBS (irritable bowel  syndrome)    Infectious colitis 04/29/2016   Migraines    Moderate dehydration 04/29/2016   Muscle pain    Opioid overdose (HCC) 12/07/2022   Reflux    Seizures (HCC)    Unexplained weight loss 11/12/2016   Past Surgical History:  Procedure Laterality Date   ABDOMINAL HYSTERECTOMY     APPENDECTOMY  2009   C5 FUSION     C6 FUSION     C7 FUSION     COLONOSCOPY  02/2006   COLONOSCOPY WITH PROPOFOL  N/A 12/25/2016   Procedure: COLONOSCOPY WITH PROPOFOL ;  Surgeon: Marshall Skeeter, MD;  Location: [facility] END[facility]COPY;  Service: Endoscopy;  Laterality: N/A;   ESOPHAGOGASTRODUODENOSCOPY (EGD) WITH PROPOFOL  N/A 12/25/2016   Procedure: ESOPHAGOGASTRODUODENOSCOPY (EGD) WITH PROPOFOL ;  Surgeon: Marshall Skeeter, MD;  Location: [facility] ENDOSCOPY;  Service: Endoscopy;  Laterality: N/A;   FOOT SURGERY     HIP ARTHROPLASTY     L4 FUSION     L5 FUSION     S1 FUSION     Family History  Problem Relation 59 of Onset   Diabetes Father    Hypertension Father    Social History   Socioeconomic History   Marital status: Divorced    Spouse name: Not on file   Number of children: Not on file   Years of education: Not on file   Highest education level: Not on file  Occupational History  Not on file  Tobacco Use   Smoking status: Every Day    Current packs/day: 1.00    Types: Cigarettes    Passive exposure: Past   Smokeless tobacco: Never   Tobacco comments:    ELECTRONIC VAPOR  Substance and Sexual Activity   Alcohol use: Not Currently    Comment: OCCASIONALLY   Drug use: Not Currently   Sexual activity: Not Currently  Other Topics Concern   Not on file  Social History Narrative   Not on file   Social Drivers of Health   Financial Resource Strain: Not on file  Food Insecurity: No Food Insecurity (12/29/2023)   Hunger Vital Sign    Worried About Running Out of Food in the Last Year: Never true    Ran Out of Food in the Last Year: Never true  Recent Concern: Food Insecurity - Food  Insecurity Present (10/23/2023)   Hunger Vital Sign    Worried About Running Out of Food in the Last Year: Sometimes true    Ran Out of Food in the Last Year: Sometimes true  Transportation Needs: No Transportation Needs (12/29/2023)   PRAPARE - Administrator, Civil Service (Medical): No    Lack of Transportation (Non-Medical): No  Recent Concern: Transportation Needs - Unmet Transportation Needs (10/23/2023)   PRAPARE - Administrator, Civil Service (Medical): Yes    Lack of Transportation (Non-Medical): No  Physical Activity: Not on file  Stress: Not on file  Social Connections: Unknown (12/29/2023)   Social Connection and Isolation Panel [NHANES]    Frequency of Communication with Friends and Family: Twice a week    Frequency of Social Gatherings with Friends and Family: Three times a week    Attends Religious Services: Never    Active Member of Clubs or Organizations: No    Attends Banker Meetings: Never    Marital Status: Patient declined  Recent Concern: Social Connections - Socially Isolated (10/09/2023)   Social Connection and Isolation Panel [NHANES]    Frequency of Communication with Friends and Family: More than three times a week    Frequency of Social Gatherings with Friends and Family: Three times a week    Attends Religious Services: Never    Active Member of Clubs or Organizations: No    Attends Banker Meetings: Never    Marital Status: Divorced   Allergies  Allergen Reactions   Nsaids Hives   Tapentadol Swelling, Rash and Other (See Comments)    Nucynta- Made her deathly sick   Cephalexin  Other (See Comments)    Reaction not cited   Codeine Other (See Comments)    Reaction not cited   Darvocet [Propoxyphene N-Acetaminophen ] Other (See Comments)    Reaction not cited   Latex Other (See Comments)    Reaction not cited   Silicone Other (See Comments)    Reaction not cited   Sulfa Antibiotics Hives   Tape Other  (See Comments)    Reaction not cited   Meloxicam Rash    Medications   (Not in a hospital admission)     Current Facility-Administered Medications:    acetaminophen  (OFIRMEV ) IV 1,000 mg, 1,000 mg, Intravenous, Q6H, Wouk, Haynes Lips, MD, Stopped at 12/29/23 0800   acetaminophen  (TYLENOL ) tablet 650 mg, 650 mg, Oral, Q4H PRN, 650 mg at 12/29/23 0438 **OR** acetaminophen  (TYLENOL ) suppository 650 mg, 650 mg, Rectal, Q4H PRN, Lanetta Pion, MD   bisacodyl  (DULCOLAX) EC tablet 5 mg,  5 mg, Oral, Daily PRN, Duncan, Hazel V, MD   carbamazepine  (TEGRETOL  XR) 12 hr tablet 100 mg, 100 mg, Oral, BID, Duncan, Hazel V, MD   [COMPLETED] heparin  bolus via infusion 2,000 Units, 2,000 Units, Intravenous, Once, 2,000 Units at 12/29/23 1111 **AND** heparin  ADULT infusion 100 units/mL (25000 units/250mL), 1,000 Units/hr, Intravenous, Continuous, Hallaji, Sheema M, RPH, Last Rate: 10 mL/hr at 12/29/23 1111, 1,000 Units/hr at 12/29/23 1111   lamoTRIgine  (LAMICTAL ) tablet 50 mg, 50 mg, Oral, BID, Lanetta Pion, MD   levETIRAcetam  (KEPPRA ) undiluted injection 1,500 mg, 1,500 mg, Intravenous, Q12H, Wouk, Haynes Lips, MD, 1,500 mg at 12/29/23 1610   LORazepam  (ATIVAN ) injection 1 mg, 1 mg, Intravenous, Q5 Min x 2 PRN, Lanetta Pion, MD   magnesium  citrate solution 1 Bottle, 1 Bottle, Oral, Once PRN, Lanetta Pion, MD   mirtazapine  (REMERON ) tablet 30 mg, 30 mg, Oral, QHS, Wouk, Haynes Lips, MD   morphine  (MSIR) tablet 15 mg, 15 mg, Oral, Q4H PRN, Lanetta Pion, MD   naloxone (NARCAN) injection 0.4 mg, 0.4 mg, Intravenous, PRN, Sari Cunning, Haynes Lips, MD, 0.4 mg at 12/29/23 1016   ondansetron  (ZOFRAN ) tablet 4 mg, 4 mg, Oral, Q6H PRN **OR** ondansetron  (ZOFRAN ) injection 4 mg, 4 mg, Intravenous, Q6H PRN, Lanetta Pion, MD, 4 mg at 12/29/23 9604   Oral care mouth rinse, 15 mL, Mouth Rinse, Q2H, Lanetta Pion, MD   Oral care mouth rinse, 15 mL, Mouth Rinse, PRN, Lanetta Pion, MD   pantoprazole   (PROTONIX ) injection 40 mg, 40 mg, Intravenous, Q24H, Wouk, Haynes Lips, MD, 40 mg at 12/29/23 0930   potassium chloride  (KLOR-CON ) CR tablet 40 mEq, 40 mEq, Oral, Once, Meegan, Eryn, RPH  Current Outpatient Medications:    bisacodyl  (DULCOLAX) 5 MG EC tablet, Take 1 tablet (5 mg total) by mouth daily as needed for moderate constipation., Disp: 30 tablet, Rfl: 0   carbamazepine  (TEGRETOL  XR) 100 MG 12 hr tablet, Take 1 tablet (100 mg total) by mouth 2 (two) times daily., Disp: 60 tablet, Rfl: 2   DULoxetine  (CYMBALTA ) 30 MG capsule, Take 3 capsules (90 mg total) by mouth daily., Disp: 90 capsule, Rfl: 3   DULoxetine  (CYMBALTA ) 60 MG capsule, Take 60 mg by mouth 2 (two) times daily., Disp: , Rfl:    furosemide  (LASIX ) 20 MG tablet, Take 1 tablet (20 mg total) by mouth daily., Disp: 30 tablet, Rfl: 3   gabapentin  (NEURONTIN ) 300 MG capsule, Take 1 capsule (300 mg total) by mouth 4 (four) times daily., Disp: 120 capsule, Rfl: 0   lamoTRIgine  (LAMICTAL ) 25 MG tablet, Take 2 tablets (50 mg total) by mouth daily for 12 days, THEN 2 tablets (50 mg total) 2 (two) times daily for 14 days, THEN 4 tablets (100 mg total) 2 (two) times daily., Disp: 320 tablet, Rfl: 0   levETIRAcetam  (KEPPRA ) 500 MG tablet, Take 500 mg by mouth 2 (two) times daily., Disp: , Rfl:    levETIRAcetam  (KEPPRA ) 750 MG tablet, Take 750 mg by mouth in the morning, at noon, in the evening, and at bedtime., Disp: , Rfl:    Melatonin 10 MG TABS, Take 10 mg by mouth at bedtime., Disp: , Rfl:    mirtazapine  (REMERON ) 30 MG tablet, Take 1 tablet (30 mg total) by mouth at bedtime., Disp: 30 tablet, Rfl: 2   morphine  (MSIR) 15 MG tablet, Take 1 tablet (15 mg total) by mouth every 8 (eight) hours as needed for severe pain (pain score 7-10)., Disp: ,  Rfl:    NON FORMULARY, Apply 1 Application topically 3 (three) times daily as needed. Rock Salt Cream (Similar to [employer]), Disp: , Rfl:    ondansetron  (ZOFRAN -ODT) 8 MG disintegrating tablet, Take 1  tablet (8 mg total) by mouth every 8 (eight) hours as needed for nausea., Disp: 20 tablet, Rfl: 1   pantoprazole  (PROTONIX ) 40 MG tablet, Take 40 mg by mouth daily., Disp: , Rfl:    phenytoin  (DILANTIN ) 100 MG ER capsule, Take 100 mg by mouth 2 (two) times daily., Disp: , Rfl:    potassium chloride  (KLOR-CON  M) 10 MEQ tablet, Take 1 tablet (10 mEq total) by mouth daily., Disp: 30 tablet, Rfl: 3   potassium chloride  (KLOR-CON  M) 10 MEQ tablet, Take 1 tablet (10 mEq total) by mouth daily., Disp: 30 tablet, Rfl: 3  Vitals   Vitals:   12/29/23 1100 12/29/23 1130 12/29/23 1200 12/29/23 1214  BP: 125/75 115/77 117/72   Pulse: 76 81 76   Resp: 15 (!) 21 18   Temp:    97.9 F (36.6 C)  TempSrc:    Axillary  SpO2: 100% 100% 100%   Weight:      Height:         Body mass index is 20.08 kg/m.  Physical Exam   Vitals:   12/29/23 1200 12/29/23 1214  BP: 117/72   Pulse: 76   Resp: 18   Temp:  97.9 F (36.6 C)  SpO2: 100%     Gen: patient lying in bed, NAD CV: extremities appear well-perfused Resp: on bipap  Neurologic exam MS: lethargic but arousable, oriented x3, follows commands Speech: no dysarthria, no aphasia CN: PERRL, VFF, EOMI, sensation intact, face symmetric, hearing intact to voice Motor: does not fully participate in formal strength exam but moves all extremities well with apparent full strength Sensory: SILT Reflexes: 2+ symm with toes down bilat Coordination: does not participate Gait: deferred   Labs   CBC:  Recent Labs  Lab 12/29/23 0020  WBC 8.2  NEUTROABS 6.7  HGB 12.2  HCT 38.2  MCV 100.5*  PLT 247    Basic Metabolic Panel:  Lab Results  Component Value Date   NA 139 12/29/2023   K 2.8 (L) 12/29/2023   CO2 33 (H) 12/29/2023   GLUCOSE 124 (H) 12/29/2023   BUN 12 12/29/2023   CREATININE 0.50 12/29/2023   CALCIUM  8.2 (L) 12/29/2023   GFRNONAA >60 12/29/2023   GFRAA >60 01/15/2015   Lipid Panel:  Lab Results  Component Value Date    LDLCALC 100 (H) 09/30/2023   HgbA1c:  Lab Results  Component Value Date   HGBA1C 5.5 12/24/2022   Urine Drug Screen:     Component Value Date/Time   LABOPIA POSITIVE (A) 12/29/2023 0126   COCAINSCRNUR NONE DETECTED 12/29/2023 0126   LABBENZ NONE DETECTED 12/29/2023 0126   AMPHETMU NONE DETECTED 12/29/2023 0126   THCU POSITIVE (A) 12/29/2023 0126   LABBARB NONE DETECTED 12/29/2023 0126    Alcohol Level     Component Value Date/Time   ETH <10 07/12/2023 0723    CT Head without contrast: No acute process personal review  Impression   This is a 58 year old woman very well-known to our service with a history of intractable epilepsy with noncompliance with her outpatient regimen.  She presented with fever and encephalopathy and neurology was consulted to determine if she could possibly be in status epilepticus given her encephalopathy and to see if we recommended lumbar puncture for for  their evaluation of the fever.  She was subsequently found to have both colitis as well as acute PE which explains both findings.  Since we now have an alternate source for the fever lumbar puncture is not felt to be indicated at this time.  Also on my exam while she is lethargic she is still oriented x 3, following commands, and has a nonfocal neurologic exam therefore I am not concerned about status epilepticus at this time.  Unfortunately she has been routinely noncompliant with her outpatient regimen of AEDs and had subtherapeutic levels of all 3 as recently as 3 weeks ago secondary to this. She is supposed to be taking Keppra  1500 twice daily, lamotrigine  50 twice daily, carbamazepine  100 twice daily.  She has been on lacosamide  in the past but self-discontinued 2/2 reported side effect of lethargy.   I would not recommend changing her prescribed outpatient regimen however she is intermittently too lethargic with her hypercarbia as well as her underlying infection to take p.o. since admission.  While this  is the case we will bridge her with IV lacosamide  since carbamazepine  and lamotrigine  are not available p.o.  Will change p.o. Keppra  to IV for now.  When she is able to take po then vimpat  can be discontinued and she may be discharged on her home regimen of Keppra  1500 mg twice daily, lamotrigine  50 mg twice daily, carbamazepine  100 twice daily.  Recommendations   - Keppra  changed from po to IV - I have not changed her orders for carbamazepine  100mg  bid and lamotrigine  50mg  bid but she has is currently too lethargic to take po - While this is the case I will add IV vimpat  as a bridge - When she is able to take po, IV vimpat  may be discontinued and she can be discharged on home regimen of keppra  1500mg  bid, lamotrigine  50mg  bid, and carbamazepine  100mg  bid - She may f/u with her established outpatient provider Dr. Mason Sole as scheduled  Neurology will be available for questions prn going forward. ______________________________________________________________________   Thank you for the opportunity to take part in the care of this patient. If you have any further questions, please contact the neurology consultation attending.  Signed,  Greg Leaks, MD [facility] [phone]  If 7pm- 7am, please page neurology on call as listed in [facility]ION.  **Any copied and pasted documentation in this note was written by me in another application not billed for and pasted by me into this document.

## 2023-12-29 NOTE — H&P (Signed)
 History and Physical    Patient: Abigail [name] [facility] YNW:[id number] DOB: Jul 12, 1966 DOA: 12/28/2023 DOS: the patient was seen and examined on 12/29/2023 PCP: [name], Susan K, MD  Patient coming from: Home  Chief Complaint:  Chief Complaint  Patient presents with   Weakness    HPI: Yvana [facility] [place] is a 58 y.o. female with medical history significant for chronic pancreatitis/pancreatic pseudocyst, chronic back pain pain on opiates, opioid-induced constipation , seizure disorder, chronic hypoxic respiratory failure on prn O2 at 2 L,,frequent hospitalizations most recently from 3/15 to 11/05/2023 with aspiration pneumonia and hypoxia, discharged on room air with continuation of as needed O2 who is now being admitted with acute respiratory failure with hypercapnia after presenting with confusion.  Patient was unable to be contacted for 2 days (caregiver was not around )and when relative checked in on her, she was found confused and unable to recognize the relative prompting a call to EMS.  Of note, patient was seen by her neurologist on 4/28 (note reviewed) with a diagnosis of intractable epilepsy with breakthrough seizures and was found to have subtherapeutic levels of her Keppra , Lamictal  and Tegretol  which was attributed to noncompliance.   With EMS O2 sat was 70% on room air and she was placed on 4 L for transport with improvement to 98%.  ED course and data review: Tachycardic to 102, O2 sats in the high 90s on BiPAP at 30% FiO2 Labs notable for  VBG with pH 7.29 and pCO2 76, baseline in the 40s to 50s normal WBC and lactic acid Troponin 49 Normal creatinine, lipase and LFTs Potassium 2.8, normal magnesium  UDS positive for tricyclic's, opiates and cannabinoids Urinalysis not consistent with UTI  EKG, personally viewed and interpreted showing sinus at 96 with nonspecific T wave changes  Chest x-ray nonacute  Patient treated with DuoNebs and Solu-Medrol  and given an LR  bolus  Hospitalist consulted for admission.     Past Medical History:  Diagnosis Date   Anxiety    Arthritis    Back pain    Basal cell carcinoma 10/04/2021   R axilla - needs excised 11/28/21   Basal cell carcinoma 10/04/2021   L antecubital excised 11/14/21   Diarrhea 11/12/2016   Fibromyalgia    Generalized abdominal pain 11/12/2016   H. pylori infection    Hyperlipidemia    IBS (irritable bowel syndrome)    Infectious colitis 04/29/2016   Migraines    Moderate dehydration 04/29/2016   Muscle pain    Opioid overdose (HCC) 12/07/2022   Reflux    Seizures (HCC)    Unexplained weight loss 11/12/2016   Past Surgical History:  Procedure Laterality Date   ABDOMINAL HYSTERECTOMY     APPENDECTOMY  2009   C5 FUSION     C6 FUSION     C7 FUSION     COLONOSCOPY  02/2006   COLONOSCOPY WITH PROPOFOL  N/A 12/25/2016   Procedure: COLONOSCOPY WITH PROPOFOL ;  Surgeon: Marshall Skeeter, MD;  Location: [facility] ENDOSCOPY;  Service: Endoscopy;  Laterality: N/A;   ESOPHAGOGASTRODUODENOSCOPY (EGD) WITH PROPOFOL  N/A 12/25/2016   Procedure: ESOPHAGOGASTRODUODENOSCOPY (EGD) WITH PROPOFOL ;  Surgeon: Marshall Skeeter, MD;  Location: [facility] ENDOSCOPY;  Service: Endoscopy;  Laterality: N/A;   FOOT SURGERY     HIP ARTHROPLASTY     L4 FUSION     L5 FUSION     S1 FUSION     Social History:  reports that she has been smoking cigarettes. She has been exposed to tobacco  smoke. She has never used smokeless tobacco. She reports that she does not currently use alcohol. She reports that she does not currently use drugs.  Allergies  Allergen Reactions   Nsaids Hives   Tapentadol Swelling, Rash and Other (See Comments)    Nucynta- Made her deathly sick   Cephalexin  Other (See Comments)    Reaction not cited   Codeine Other (See Comments)    Reaction not cited   Darvocet [Propoxyphene N-Acetaminophen ] Other (See Comments)    Reaction not cited   Latex Other (See Comments)    Reaction not cited    Silicone Other (See Comments)    Reaction not cited   Sulfa Antibiotics Hives   Tape Other (See Comments)    Reaction not cited   Meloxicam Rash    Family History  Problem Relation Age of Onset   Diabetes Father    Hypertension Father     Prior to Admission medications   Medication Sig Start Date End Date Taking? Authorizing Provider  bisacodyl  (DULCOLAX) 5 MG EC tablet Take 1 tablet (5 mg total) by mouth daily as needed for moderate constipation. 10/04/23   Brenna Cam, MD  carbamazepine  (TEGRETOL  XR) 100 MG 12 hr tablet Take 1 tablet (100 mg total) by mouth 2 (two) times daily. 10/16/23 11/15/23  Luna Salinas, MD  DULoxetine  (CYMBALTA ) 30 MG capsule Take 3 capsules (90 mg total) by mouth daily. 10/17/23   Amin, Sumayya, MD  furosemide  (LASIX ) 20 MG tablet Take 1 tablet (20 mg total) by mouth daily. 12/24/23   [name], Susan K, MD  gabapentin  (NEURONTIN ) 300 MG capsule Take 1 capsule (300 mg total) by mouth 4 (four) times daily. 10/04/23 11/03/23  Brenna Cam, MD  lamoTRIgine  (LAMICTAL ) 25 MG tablet Take 2 tablets (50 mg total) by mouth daily for 12 days, THEN 2 tablets (50 mg total) 2 (two) times daily for 14 days, THEN 4 tablets (100 mg total) 2 (two) times daily. 10/27/23 12/22/23  Tiajuana Fluke, MD  levETIRAcetam  (KEPPRA ) 500 MG tablet Take 500 mg by mouth 2 (two) times daily.    [provider]  Melatonin 10 MG TABS Take 10 mg by mouth at bedtime.    [provider]  mirtazapine  (REMERON ) 30 MG tablet Take 1 tablet (30 mg total) by mouth at bedtime. 10/16/23   Luna Salinas, MD  morphine  (MSIR) 15 MG tablet Take 1 tablet (15 mg total) by mouth every 8 (eight) hours as needed for severe pain (pain score 7-10). 11/05/23   Sheril Dines, MD  NON FORMULARY Apply 1 Application topically 3 (three) times daily as needed. Rock Salt Cream (Similar to [employer])    [provider]  ondansetron  (ZOFRAN -ODT) 8 MG disintegrating tablet Take 1 tablet (8 mg total) by mouth every 8  (eight) hours as needed for nausea. 09/23/23   [name], Susan K, MD  pantoprazole  (PROTONIX ) 40 MG tablet Take 40 mg by mouth daily. 10/02/23   [provider]  potassium chloride  (KLOR-CON  M) 10 MEQ tablet Take 1 tablet (10 mEq total) by mouth daily. 11/06/23   [name], Susan K, MD  potassium chloride  (KLOR-CON  M) 10 MEQ tablet Take 1 tablet (10 mEq total) by mouth daily. 12/24/23   [name], Susan K, MD    Physical Exam: Vitals:   12/28/23 2349 12/28/23 2352 12/28/23 2353 12/29/23 0230  BP:   123/89 (!) 140/84  Pulse:    86  Resp:    (!) 33  Temp:   98.8  F (37.1 C)   TempSrc:   Axillary   SpO2: 100%   100%  Weight:  59.9 kg    Height:  5\' 8"  (1.727 m)     Physical Exam  Labs on Admission: I have personally reviewed following labs and imaging studies  CBC: Recent Labs  Lab 12/29/23 0020  WBC 8.2  NEUTROABS 6.7  HGB 12.2  HCT 38.2  MCV 100.5*  PLT 247   Basic Metabolic Panel: Recent Labs  Lab 12/29/23 0020 12/29/23 0028  NA 139  --   K 2.8*  --   CL 95*  --   CO2 33*  --   GLUCOSE 124*  --   BUN 12  --   CREATININE 0.50  --   CALCIUM  8.2*  --   MG  --  2.2   GFR: Estimated Creatinine Clearance: 72.5 mL/min (by C-G formula based on SCr of 0.5 mg/dL). Liver Function Tests: Recent Labs  Lab 12/29/23 0020  AST 29  ALT 13  ALKPHOS 118  BILITOT 0.8  PROT 6.7  ALBUMIN 2.0*   Recent Labs  Lab 12/29/23 0020  LIPASE 51   No results for input(s): "AMMONIA" in the last 168 hours. Coagulation Profile: Recent Labs  Lab 12/29/23 0020  INR 1.0   Cardiac Enzymes: No results for input(s): "CKTOTAL", "CKMB", "CKMBINDEX", "TROPONINI" in the last 168 hours. BNP (last 3 results) No results for input(s): "PROBNP" in the last 8760 hours. HbA1C: No results for input(s): "HGBA1C" in the last 72 hours. CBG: No results for input(s): "GLUCAP" in the last 168 hours. Lipid Profile: No results for input(s): "CHOL", "HDL", "LDLCALC", "TRIG", "CHOLHDL", "LDLDIRECT" in  the last 72 hours. Thyroid  Function Tests: No results for input(s): "TSH", "T4TOTAL", "FREET4", "T3FREE", "THYROIDAB" in the last 72 hours. Anemia Panel: No results for input(s): "VITAMINB12", "FOLATE", "FERRITIN", "TIBC", "IRON", "RETICCTPCT" in the last 72 hours. Urine analysis:    Component Value Date/Time   COLORURINE AMBER (A) 12/29/2023 0126   APPEARANCEUR CLEAR (A) 12/29/2023 0126   APPEARANCEUR Clear 05/24/2014 1903   LABSPEC 1.021 12/29/2023 0126   LABSPEC 1.015 05/24/2014 1903   PHURINE 6.0 12/29/2023 0126   GLUCOSEU NEGATIVE 12/29/2023 0126   GLUCOSEU Negative 05/24/2014 1903   HGBUR NEGATIVE 12/29/2023 0126   BILIRUBINUR NEGATIVE 12/29/2023 0126   BILIRUBINUR Negative 05/24/2014 1903   KETONESUR 5 (A) 12/29/2023 0126   PROTEINUR 30 (A) 12/29/2023 0126   NITRITE NEGATIVE 12/29/2023 0126   LEUKOCYTESUR NEGATIVE 12/29/2023 0126   LEUKOCYTESUR Negative 05/24/2014 1903    Radiological Exams on Admission: DG Chest Port 1 View Result Date: 12/29/2023 CLINICAL DATA:  Altered mental status and failure to thrive. EXAM: PORTABLE CHEST 1 VIEW COMPARISON:  November 02, 2023 FINDINGS: The heart size and mediastinal contours are within normal limits. Low lung volumes are noted. Mild areas of linear scarring and/or atelectasis are seen within the bilateral lung bases. No pleural effusion or pneumothorax is identified. Postoperative changes are seen within the lower cervical spine. Multilevel degenerative changes are present throughout the thoracic spine. IMPRESSION: Low lung volumes with mild bibasilar linear scarring and/or atelectasis. Electronically Signed   By: Virgle Grime M.D.   On: 12/29/2023 01:07   Data Reviewed for HPI: Relevant notes from primary care and specialist visits, past discharge summaries as available in EHR, including Care Everywhere. Prior diagnostic testing as pertinent to current admission diagnoses Updated medications and problem lists for reconciliation ED  course, including vitals, labs, imaging, treatment and response to treatment  Triage notes, nursing and pharmacy notes and ED provider's notes Notable results as noted above in HPI      Assessment and Plan: * Acute on chronic respiratory failure with hypercapnia (HCC) Likely secondary to non-compliance with oxygen, polypharmacy No evidence of acute infection.  Had aspiration pneumonia back in March At baseline uses O2 as needed only Continue BiPAP and wean as tolerated  Acute metabolic encephalopathy Polypharmacy versus seizure versus substance use versus medication noncompliance Uncertain etiology but several likely concurrent possibilities -Polypharmacy - multiple sedating meds including gabapentin , melatonin, mirtazapine , MS IR -Hypercapnia /CO2 narcosis -.possible seizure secondary to noncompliance (subtherapeutic levels at neurologist on 4/28) -Substance use given UDS positive for cannabinoids  Neurologic checks Fall and aspiration precautions Seizure precautions Resume home seizure meds and get levels of seizure meds Avoid sedating meds as much as possible Keep n.p.o. until more awake and alert Consider TOC consult  Epilepsy, intractable (HCC) History of noncompliance with antiepileptic drugs Last visit to neurologist was 4/28 when patient had subtherapeutic levels Will get levels of carbamazepine , lamotrigine  and Keppra  Resume home seizure meds Seizure precautions     Chronic back pain pain  On Chronic opiates Minimize use as much as possible Multimodal pain control  Abdominal pain,  chronic Chronic pancreatitis with pancreatic pseudocyst No acute issues   Opioid-induced constipation Laxatives prn   DVT prophylaxis: Lovenox   Consults: none  Advance Care Planning:   Code Status: Prior   Family Communication: Mother at bedside.  Discussed plan in detail and she voiced understanding and agreement  Disposition Plan: Back to previous home  environment  Severity of Illness: The appropriate patient status for this patient is OBSERVATION. Observation status is judged to be reasonable and necessary in order to provide the required intensity of service to ensure the patient's safety. The patient's presenting symptoms, physical exam findings, and initial radiographic and laboratory data in the context of their medical condition is felt to place them at decreased risk for further clinical deterioration. Furthermore, it is anticipated that the patient will be medically stable for discharge from the hospital within 2 midnights of admission.   Author: Lanetta Pion, MD 12/29/2023 2:52 AM  For on call review [email or web address].

## 2023-12-29 NOTE — ED Notes (Signed)
 Resting/ sleeping on bipap, tolerating, arousable to voice, RR high, IV tylenol  given for fever. 5th of 6 KCL runs initiated. NAD, calm, intermittent mild restlessness. VSS. Will monitor.

## 2023-12-29 NOTE — ED Notes (Signed)
 Sleeping/ resting, somnolent, comfortable on bipap.

## 2023-12-29 NOTE — ED Notes (Signed)
 Lavender and blue sent to lab. Heparin  level due.

## 2023-12-29 NOTE — ED Notes (Signed)
 Aroused/ awakened. Switched from Bipap to [place] 4L.

## 2023-12-29 NOTE — Progress Notes (Signed)
 PHARMACY - ANTICOAGULATION CONSULT NOTE  Pharmacy Consult for heparin  infusion  Indication: pulmonary embolus  Allergies  Allergen Reactions   Nsaids Hives   Tapentadol Swelling, Rash and Other (See Comments)    Nucynta- Made her deathly sick   Cephalexin  Other (See Comments)    Reaction not cited   Codeine Other (See Comments)    Reaction not cited   Darvocet [Propoxyphene N-Acetaminophen ] Other (See Comments)    Reaction not cited   Latex Other (See Comments)    Reaction not cited   Silicone Other (See Comments)    Reaction not cited   Sulfa Antibiotics Hives   Tape Other (See Comments)    Reaction not cited   Meloxicam Rash    Patient Measurements: Height: 5\' 8"  (172.7 cm) Weight: 59.9 kg (132 lb 0.9 oz) IBW/kg (Calculated) : 63.9 HEPARIN  DW (KG): 59.9  Vital Signs: Temp: 100.3 F (37.9 C) (05/11 0622) Temp Source: Rectal (05/11 0622) BP: 126/69 (05/11 1030) Pulse Rate: 74 (05/11 1040)  Labs: Recent Labs    12/29/23 0020  HGB 12.2  HCT 38.2  PLT 247  LABPROT 13.4  INR 1.0  CREATININE 0.50  TROPONINIHS 49*    Estimated Creatinine Clearance: 72.5 mL/min (by C-G formula based on SCr of 0.5 mg/dL).   Medical History: Past Medical History:  Diagnosis Date   Anxiety    Arthritis    Back pain    Basal cell carcinoma 10/04/2021   R axilla - needs excised 11/28/21   Basal cell carcinoma 10/04/2021   L antecubital excised 11/14/21   Diarrhea 11/12/2016   Fibromyalgia    Generalized abdominal pain 11/12/2016   H. pylori infection    Hyperlipidemia    IBS (irritable bowel syndrome)    Infectious colitis 04/29/2016   Migraines    Moderate dehydration 04/29/2016   Muscle pain    Opioid overdose (HCC) 12/07/2022   Reflux    Seizures (HCC)    Unexplained weight loss 11/12/2016    Medications:  Scheduled:   carbamazepine   100 mg Oral BID   heparin   2,000 Units Intravenous Once   lamoTRIgine   50 mg Oral BID   levETIRAcetam   1,500 mg Intravenous  Q12H   mirtazapine   30 mg Oral QHS   mouth rinse  15 mL Mouth Rinse Q2H   pantoprazole  (PROTONIX ) IV  40 mg Intravenous Q24H   potassium chloride   40 mEq Oral Once    Assessment: 58 yo female admitted with acute on chronic respiratory failure and acute encephalopathy.  CT Angio Chest positive for single subsegmental pulmonary embolism in the right lower lobe. Pharmacy has been consulted for heparin  dosing and monitoring. Patient did received enoxaparin  40mg  x 1 dose this morning @ 0930.   Goal of Therapy:  Heparin  level 0.3-0.7 units/ml Monitor platelets by anticoagulation protocol: Yes   Plan:  Since patient received enoxaparin  40mg  this morning, will give reduced bolus dose of  2000 units x 1 Start heparin  infusion at 1000 units/hr Check anti-Xa level in 6 hours and daily while on heparin  Continue to monitor H&H and platelets  Malone Sear, PharmD, BCPS Clinical Pharmacist 12/29/2023 10:54 AM

## 2023-12-29 NOTE — Assessment & Plan Note (Addendum)
 History of noncompliance with antiepileptic drugs Last visit to neurologist was 4/28 when patient had subtherapeutic levels Will get levels of carbamazepine , lamotrigine  and Keppra  Resume home seizure meds Seizure precautions

## 2023-12-29 NOTE — ED Notes (Signed)
Pt removed bipap

## 2023-12-29 NOTE — ED Notes (Addendum)
 Sleeping. Aropusable to voice, NAD, calm, interactive, answers questions appropriately, oriented to person, place and situation. Confused on time. Tolerating Bipap. Preparing for CT. RT and CT notified. 6th of 6 KCL runs intiated.

## 2023-12-29 NOTE — Progress Notes (Addendum)
 PROGRESS NOTE    [name]  UJW:[id number] DOB: 03-24-1966 DOA: 12/28/2023 PCP: [name], Abigail K, MD  Outpatient Specialists: neurology    Brief Narrative:   Abigail [name] is a 58 y.o. female with medical history significant for chronic pancreatitis/pancreatic pseudocyst, chronic back pain pain on opiates, opioid-induced constipation , seizure disorder, chronic hypoxic respiratory failure on prn O2 at 2 L,,frequent hospitalizations most recently from 3/15 to 11/05/2023 with aspiration pneumonia and hypoxia, discharged on room air with continuation of as needed O2 who is now being admitted with acute respiratory failure with hypercapnia after presenting with confusion.  Patient was unable to be contacted for 2 days (caregiver was not around )and when relative checked in on her, she was found confused and unable to recognize the relative prompting a call to EMS.  Of note, patient was seen by her neurologist on 4/28 (note reviewed) with a diagnosis of intractable epilepsy with breakthrough seizures and was found to have subtherapeutic levels of her Keppra , Lamictal  and Tegretol  which was attributed to noncompliance.     Assessment & Plan:   Principal Problem:   Acute on chronic respiratory failure with hypercapnia (HCC) Active Problems:   Acute metabolic encephalopathy   Rheumatoid arthritis (HCC)   Asthma   Epilepsy, intractable (HCC)   Chronic respiratory failure with hypoxia (HCC)   Chronic pancreatitis (HCC)   Hypothyroidism   COPD (chronic obstructive pulmonary disease) (HCC)  # Acute encephalopathy Etiology unclear. Seizure/post-ictal, hypercarbia, infection (diarrhea), opioid overdose among possible etiologies (alone or in concert) - w/u as below - CT head - will trial a dose of naloxone - suppose will also need to entertain possibility of meningitis given fever  # Diarrhea # Fever Hx constipation, has had diarrhea from over-treatment, given low  grade fever prudent to work this up - gi pathogen panel and c diff - ct abdomen/pelvis - f/u respiratory panel - f/u blood culture (and will order a second)  # Seizure disorder With med noncompliance - IV keppra  for home oral - home carbamazepine  and lamotrigine  and gabapentin  when able to take po - will discuss with neurology, want to ensure not in status  # Acute on chronic hypoxic respiratory failure 3 Acute subsegmental PE Hypercarbic on arrival, is on prn o2 at home. CTA showing small subsegmental acute PE. Weaned off bipap this morning - start heparin  - check vbg  # Hypothyroid? Don't see she's on home meds - f/u tsh  # Elevated troponin EKG non-ischemic - repeat troponin  # Chronic pain - resume home duloxetine  when taking po - home morphine  on hold 2/2 encephalopathy  # MDD - home mirtazapine  when taking po  # Hypokalemia - replete  # Hx GCA Finished steroids in 2024  # RA Doesn't appear currently on meds   DVT prophylaxis: lovenox  Code Status: full Family Communication: none at bedside, no answer when mother telephoned  Level of care: Progressive Status is: Observation (for some odd reason....)    Consultants:  neurology  Procedures: none  Antimicrobials:  None thus far    Subjective: Sleeping, rouses a little  Objective: Vitals:   12/29/23 0710 12/29/23 0715 12/29/23 0720 12/29/23 0730  BP:    131/88  Pulse: 100 (!) 103 100 (!) 105  Resp: 15 14 16  (!) 27  Temp:      TempSrc:      SpO2: 99% 99% 99% 100%  Weight:      Height:  Intake/Output Summary (Last 24 hours) at 12/29/2023 0816 Last data filed at 12/29/2023 1610 Gross per 24 hour  Intake 1200 ml  Output --  Net 1200 ml   Filed Weights   12/28/23 2352  Weight: 59.9 kg    Examination:  General exam: disheveled, no acute distress Respiratory system: Clear save for basilar rales Cardiovascular system: S1 & S2 heard, RRR.   Gastrointestinal system: Abdomen is  nondistended, soft   Central nervous system: somnolent. Moving all 4 Extremities: trace LE edema Skin: scattered abrasions Psychiatry: unable to assess    Data Reviewed: I have personally reviewed following labs and imaging studies  CBC: Recent Labs  Lab 12/29/23 0020  WBC 8.2  NEUTROABS 6.7  HGB 12.2  HCT 38.2  MCV 100.5*  PLT 247   Basic Metabolic Panel: Recent Labs  Lab 12/29/23 0020 12/29/23 0028  NA 139  --   [name] 2.8*  --   CL 95*  --   CO2 33*  --   GLUCOSE 124*  --   BUN 12  --   CREATININE 0.50  --   CALCIUM  8.2*  --   MG  --  2.2   GFR: Estimated Creatinine Clearance: 72.5 mL/min (by C-G formula based on SCr of 0.5 mg/dL). Liver Function Tests: Recent Labs  Lab 12/29/23 0020  AST 29  ALT 13  ALKPHOS 118  BILITOT 0.8  PROT 6.7  ALBUMIN 2.0*   Recent Labs  Lab 12/29/23 0020  LIPASE 51   No results for input(s): "AMMONIA" in the last 168 hours. Coagulation Profile: Recent Labs  Lab 12/29/23 0020  INR 1.0   Cardiac Enzymes: No results for input(s): "CKTOTAL", "CKMB", "CKMBINDEX", "TROPONINI" in the last 168 hours. BNP (last 3 results) No results for input(s): "PROBNP" in the last 8760 hours. HbA1C: No results for input(s): "HGBA1C" in the last 72 hours. CBG: No results for input(s): "GLUCAP" in the last 168 hours. Lipid Profile: No results for input(s): "CHOL", "HDL", "LDLCALC", "TRIG", "CHOLHDL", "LDLDIRECT" in the last 72 hours. Thyroid  Function Tests: No results for input(s): "TSH", "T4TOTAL", "FREET4", "T3FREE", "THYROIDAB" in the last 72 hours. Anemia Panel: No results for input(s): "VITAMINB12", "FOLATE", "FERRITIN", "TIBC", "IRON", "RETICCTPCT" in the last 72 hours. Urine analysis:    Component Value Date/Time   COLORURINE AMBER (A) 12/29/2023 0126   APPEARANCEUR CLEAR (A) 12/29/2023 0126   APPEARANCEUR Clear 05/24/2014 1903   LABSPEC 1.021 12/29/2023 0126   LABSPEC 1.015 05/24/2014 1903   PHURINE 6.0 12/29/2023 0126    GLUCOSEU NEGATIVE 12/29/2023 0126   GLUCOSEU Negative 05/24/2014 1903   HGBUR NEGATIVE 12/29/2023 0126   BILIRUBINUR NEGATIVE 12/29/2023 0126   BILIRUBINUR Negative 05/24/2014 1903   KETONESUR 5 (A) 12/29/2023 0126   PROTEINUR 30 (A) 12/29/2023 0126   NITRITE NEGATIVE 12/29/2023 0126   LEUKOCYTESUR NEGATIVE 12/29/2023 0126   LEUKOCYTESUR Negative 05/24/2014 1903   Sepsis Labs: @LABRCNTIP (procalcitonin:4,lacticidven:4)  ) Recent Results (from the past 240 hours)  Blood culture (routine single)     Status: None (Preliminary result)   Collection Time: 12/29/23 12:20 AM   Specimen: BLOOD  Result Value Ref Range Status   Specimen Description BLOOD BLOOD LEFT ARM  Final   Special Requests   Final    BOTTLES DRAWN AEROBIC AND ANAEROBIC Blood Culture results may not be optimal due to an excessive volume of blood received in culture bottles   Culture   Final    NO GROWTH < 12 HOURS Performed at [facility], [address], [place], [place] [address]    Report Status PENDING  Incomplete         Radiology Studies: DG Chest Port 1 View Result Date: 12/29/2023 CLINICAL DATA:  Altered mental status and failure to thrive. EXAM: PORTABLE CHEST 1 VIEW COMPARISON:  November 02, 2023 FINDINGS: The heart size and mediastinal contours are within normal limits. Low lung volumes are noted. Mild areas of linear scarring and/or atelectasis are seen within the bilateral lung bases. No pleural effusion or pneumothorax is identified. Postoperative changes are seen within the lower cervical spine. Multilevel degenerative changes are present throughout the thoracic spine. IMPRESSION: Low lung volumes with mild bibasilar linear scarring and/or atelectasis. Electronically Signed   By: Virgle Grime M.D.   On: 12/29/2023 01:07        Scheduled Meds:  carbamazepine   100 mg Oral BID   enoxaparin  (LOVENOX ) injection  40 mg Subcutaneous Q24H   lamoTRIgine   50 mg Oral BID   levETIRAcetam   1,500  mg Intravenous Q12H   mouth rinse  15 mL Mouth Rinse Q2H   pantoprazole   40 mg Oral Daily   potassium chloride   40 mEq Oral Once   Continuous Infusions:  acetaminophen  Stopped (12/29/23 0812)     LOS: 0 days   CRITICAL CARE Performed by: Raymonde Calico   Total critical care time: 63 minutes  Critical care time was exclusive of separately billable procedures and treating other patients.  Critical care was necessary to treat or prevent imminent or life-threatening deterioration.  Critical care was time spent personally by me on the following activities: development of treatment plan with patient and/or surrogate as well as nursing, discussions with consultants, evaluation of patient's response to treatment, examination of patient, obtaining history from patient or surrogate, ordering and performing treatments and interventions, ordering and review of laboratory studies, ordering and review of radiographic studies, pulse oximetry and re-evaluation of patient's condition.   Raymonde Calico, MD [facility]   If 7PM-7AM, please contact night-coverage [email or web address] Password [email or web address] 12/29/2023, 8:16 AM

## 2023-12-29 NOTE — ED Notes (Addendum)
 Bipap removed by pt. Pt intermittently restless. Alert, NAD, interactive, verbal. C/o pain. Poor concentration, poor effort. Answers some questions and follows some commands. Neuro MD at [facility]. Tolerating off Bipap. VSS.

## 2023-12-30 DIAGNOSIS — J9622 Acute and chronic respiratory failure with hypercapnia: Secondary | ICD-10-CM | POA: Diagnosis not present

## 2023-12-30 LAB — CBC
HCT: 30.1 % — ABNORMAL LOW (ref 36.0–46.0)
Hemoglobin: 9.7 g/dL — ABNORMAL LOW (ref 12.0–15.0)
MCH: 32 pg (ref 26.0–34.0)
MCHC: 32.2 g/dL (ref 30.0–36.0)
MCV: 99.3 fL (ref 80.0–100.0)
Platelets: 211 10*3/uL (ref 150–400)
RBC: 3.03 MIL/uL — ABNORMAL LOW (ref 3.87–5.11)
RDW: 14 % (ref 11.5–15.5)
WBC: 8.5 10*3/uL (ref 4.0–10.5)
nRBC: 0 % (ref 0.0–0.2)

## 2023-12-30 LAB — BASIC METABOLIC PANEL WITH GFR
Anion gap: 8 (ref 5–15)
BUN: 12 mg/dL (ref 6–20)
CO2: 29 mmol/L (ref 22–32)
Calcium: 7.5 mg/dL — ABNORMAL LOW (ref 8.9–10.3)
Chloride: 100 mmol/L (ref 98–111)
Creatinine, Ser: 0.51 mg/dL (ref 0.44–1.00)
GFR, Estimated: >60 mL/min (ref >60)
Glucose, Bld: 110 mg/dL — ABNORMAL HIGH (ref 70–99)
Potassium: 3.7 mmol/L (ref 3.5–5.1)
Sodium: 137 mmol/L (ref 135–145)

## 2023-12-30 LAB — HEPARIN LEVEL (UNFRACTIONATED)
Heparin Unfractionated: 0.18 [IU]/mL — ABNORMAL LOW (ref 0.30–0.70)
Heparin Unfractionated: 0.17 [IU]/mL — ABNORMAL LOW (ref 0.30–0.70)
Heparin Unfractionated: <0.1 [IU]/mL — ABNORMAL LOW (ref 0.30–0.70)

## 2023-12-30 LAB — LAMOTRIGINE LEVEL: Lamotrigine Lvl: <1 ug/mL — ABNORMAL LOW (ref 2.0–20.0)

## 2023-12-30 LAB — D-DIMER, QUANTITATIVE: D-Dimer, Quant: 1.99 ug{FEU}/mL — ABNORMAL HIGH (ref 0.00–0.50)

## 2023-12-30 MED ORDER — HEPARIN BOLUS VIA INFUSION
1800.0000 [IU] | Freq: Once | INTRAVENOUS | Status: AC
  Administered 2023-12-30: 1800 [IU] via INTRAVENOUS
  Filled 2023-12-30: qty 1800

## 2023-12-30 MED ORDER — DULOXETINE HCL 30 MG PO CPEP
60.0000 mg | ORAL_CAPSULE | Freq: Two times a day (BID) | ORAL | Status: DC
  Administered 2023-12-30 – 2024-01-02 (×6): 60 mg via ORAL
  Filled 2023-12-30 (×6): qty 2

## 2023-12-30 MED ORDER — DEXTROSE-SODIUM CHLORIDE 5-0.45 % IV SOLN: INTRAVENOUS | Status: DC

## 2023-12-30 MED ORDER — HEPARIN BOLUS VIA INFUSION
1800.0000 [IU] | Freq: Once | INTRAVENOUS | Status: AC
Start: 2023-12-30 — End: 2023-12-30
  Administered 2023-12-30: 1800 [IU] via INTRAVENOUS
  Filled 2023-12-30: qty 1800

## 2023-12-30 MED ORDER — HEPARIN (PORCINE) 25000 UT/250ML-% IV SOLN
1500.0000 [IU]/h | INTRAVENOUS | Status: DC
  Administered 2023-12-30: 1300 [IU]/h via INTRAVENOUS
  Administered 2023-12-31: 1500 [IU]/h via INTRAVENOUS
  Filled 2023-12-30 (×2): qty 250

## 2023-12-30 MED ORDER — LEVETIRACETAM 500 MG PO TABS
1500.0000 mg | ORAL_TABLET | Freq: Two times a day (BID) | ORAL | Status: DC
  Administered 2023-12-30 – 2024-01-02 (×6): 1500 mg via ORAL
  Filled 2023-12-30 (×6): qty 3

## 2023-12-30 NOTE — ED Notes (Signed)
 Bedsheets changed as well as depends. Barrier cream applied to patient's buttocks due to redness. Pt given warm blanket. Call light in reach.

## 2023-12-30 NOTE — Progress Notes (Signed)
 PHARMACY - ANTICOAGULATION CONSULT NOTE  Pharmacy Consult for heparin  infusion  Indication: pulmonary embolus  Allergies  Allergen Reactions   Nsaids Hives   Tapentadol Swelling, Rash and Other (See Comments)    Nucynta- Made her deathly sick   Cephalexin  Other (See Comments)    Reaction not cited   Codeine Other (See Comments)    Reaction not cited   Darvocet [Propoxyphene N-Acetaminophen ] Other (See Comments)    Reaction not cited   Latex Other (See Comments)    Reaction not cited   Silicone Other (See Comments)    Reaction not cited   Sulfa Antibiotics Hives   Tape Other (See Comments)    Reaction not cited   Meloxicam Rash    Patient Measurements: Height: 5\' 8"  (172.7 cm) Weight: 59.9 kg (132 lb 0.9 oz) IBW/kg (Calculated) : 63.9 HEPARIN  DW (KG): 59.9  Vital Signs: Temp: 98 F (36.7 C) (05/12 0438) Temp Source: Axillary (05/12 0438) BP: 121/71 (05/12 1200) Pulse Rate: 64 (05/12 1200)  Labs: Recent Labs    12/29/23 0020 12/29/23 1200 12/29/23 1842 12/30/23 0037 12/30/23 0409 12/30/23 0935  HGB 12.2  --   --   --  9.7*  --   HCT 38.2  --   --   --  30.1*  --   PLT 247  --   --   --  211  --   LABPROT 13.4  --   --   --   --   --   INR 1.0  --   --   --   --   --   HEPARINUNFRC  --   --  0.52 0.18*  --  0.17*  CREATININE 0.50 0.41*  --   --  0.51  --   TROPONINIHS 49* 37*  --   --   --   --     Estimated Creatinine Clearance: 72.5 mL/min (by C-G formula based on SCr of 0.51 mg/dL).   Medical History: Past Medical History:  Diagnosis Date   Anxiety    Arthritis    Back pain    Basal cell carcinoma 10/04/2021   R axilla - needs excised 11/28/21   Basal cell carcinoma 10/04/2021   L antecubital excised 11/14/21   Diarrhea 11/12/2016   Fibromyalgia    Generalized abdominal pain 11/12/2016   H. pylori infection    Hyperlipidemia    IBS (irritable bowel syndrome)    Infectious colitis 04/29/2016   Migraines    Moderate dehydration 04/29/2016    Muscle pain    Opioid overdose (HCC) 12/07/2022   Reflux    Seizures (HCC)    Unexplained weight loss 11/12/2016    Medications:  Scheduled:   carbamazepine   100 mg Oral BID   lamoTRIgine   50 mg Oral BID   levETIRAcetam   1,500 mg Intravenous Q12H   mirtazapine   30 mg Oral QHS   mouth rinse  15 mL Mouth Rinse Q2H   pantoprazole  (PROTONIX ) IV  40 mg Intravenous Q24H    Assessment: 58 yo female admitted with acute on chronic respiratory failure and acute encephalopathy.  CT Angio Chest positive for single subsegmental pulmonary embolism in the right lower lobe. Pharmacy has been consulted for heparin  dosing and monitoring. Patient did received enoxaparin  40mg  x 1 dose this morning @ 0930.   Goal of Therapy:  Heparin  level 0.3-0.7 units/ml Monitor platelets by anticoagulation protocol: Yes  0511 @ 1842: HL 0.52 = Therapeutic x 1 0512 0037 HL 0.18, subtherapeutic  0512 0935 HL 0.17   Plan:  Bolus 1800 units x 1 Increase heparin  infusion to 1300 units/hr Recheck anti-Xa level in 6 hours after rate change Continue to monitor H&H and platelets  Ramonita Burow, PharmD, BCPS 12/30/2023 12:11 PM

## 2023-12-30 NOTE — Progress Notes (Signed)
 PHARMACY - ANTICOAGULATION CONSULT NOTE  Pharmacy Consult for heparin  infusion  Indication: pulmonary embolus  Allergies  Allergen Reactions   Nsaids Hives   Tapentadol Swelling, Rash and Other (See Comments)    Nucynta- Made her deathly sick   Cephalexin  Other (See Comments)    Reaction not cited   Codeine Other (See Comments)    Reaction not cited   Darvocet [Propoxyphene N-Acetaminophen ] Other (See Comments)    Reaction not cited   Latex Other (See Comments)    Reaction not cited   Silicone Other (See Comments)    Reaction not cited   Sulfa Antibiotics Hives   Tape Other (See Comments)    Reaction not cited   Meloxicam Rash    Patient Measurements: Height: 5\' 8"  (172.7 cm) Weight: 59.9 kg (132 lb 0.9 oz) IBW/kg (Calculated) : 63.9 HEPARIN  DW (KG): 59.9  Vital Signs: Temp: 97.7 F (36.5 C) (05/11 2221) Temp Source: Oral (05/11 2221) BP: 130/76 (05/11 2221) Pulse Rate: 66 (05/11 2221)  Labs: Recent Labs    12/29/23 0020 12/29/23 1200 12/29/23 1842 12/30/23 0037  HGB 12.2  --   --   --   HCT 38.2  --   --   --   PLT 247  --   --   --   LABPROT 13.4  --   --   --   INR 1.0  --   --   --   HEPARINUNFRC  --   --  0.52 0.18*  CREATININE 0.50 0.41*  --   --   TROPONINIHS 49* 37*  --   --     Estimated Creatinine Clearance: 72.5 mL/min (A) (by C-G formula based on SCr of 0.41 mg/dL (L)).   Medical History: Past Medical History:  Diagnosis Date   Anxiety    Arthritis    Back pain    Basal cell carcinoma 10/04/2021   R axilla - needs excised 11/28/21   Basal cell carcinoma 10/04/2021   L antecubital excised 11/14/21   Diarrhea 11/12/2016   Fibromyalgia    Generalized abdominal pain 11/12/2016   H. pylori infection    Hyperlipidemia    IBS (irritable bowel syndrome)    Infectious colitis 04/29/2016   Migraines    Moderate dehydration 04/29/2016   Muscle pain    Opioid overdose (HCC) 12/07/2022   Reflux    Seizures (HCC)    Unexplained weight  loss 11/12/2016    Medications:  Scheduled:   carbamazepine   100 mg Oral BID   lamoTRIgine   50 mg Oral BID   levETIRAcetam   1,500 mg Intravenous Q12H   mirtazapine   30 mg Oral QHS   mouth rinse  15 mL Mouth Rinse Q2H   pantoprazole  (PROTONIX ) IV  40 mg Intravenous Q24H    Assessment: 58 yo female admitted with acute on chronic respiratory failure and acute encephalopathy.  CT Angio Chest positive for single subsegmental pulmonary embolism in the right lower lobe. Pharmacy has been consulted for heparin  dosing and monitoring. Patient did received enoxaparin  40mg  x 1 dose this morning @ 0930.   Goal of Therapy:  Heparin  level 0.3-0.7 units/ml Monitor platelets by anticoagulation protocol: Yes  0511 @ 1842: HL 0.52 = Therapeutic x 1 0512 0037 HL 0.18, subtherapeutic   Plan:  Bolus 1800 units x 1 Increase heparin  infusion to 1150 units/hr Recheck anti-Xa level in 6 hours after rate change Continue to monitor H&H and platelets  Abigail [name], PharmD, [facility] 12/30/2023 1:05 AM

## 2023-12-30 NOTE — Evaluation (Signed)
 Clinical/Bedside Swallow Evaluation Patient Details  Name: Abigail [name] [facility] MRN: [id number] Date of Birth: 03-29-1966  Today's Date: 12/30/2023 Time: SLP Start Time (ACUTE ONLY): 0935 SLP Stop Time (ACUTE ONLY): 0945 SLP Time Calculation (min) (ACUTE ONLY): 10 min  Past Medical History:  Past Medical History:  Diagnosis Date   Anxiety    Arthritis    Back pain    Basal cell carcinoma 10/04/2021   R axilla - needs excised 11/28/21   Basal cell carcinoma 10/04/2021   L antecubital excised 11/14/21   Diarrhea 11/12/2016   Fibromyalgia    Generalized abdominal pain 11/12/2016   H. pylori infection    Hyperlipidemia    IBS (irritable bowel syndrome)    Infectious colitis 04/29/2016   Migraines    Moderate dehydration 04/29/2016   Muscle pain    Opioid overdose (HCC) 12/07/2022   Reflux    Seizures (HCC)    Unexplained weight loss 11/12/2016   Past Surgical History:  Past Surgical History:  Procedure Laterality Date   ABDOMINAL HYSTERECTOMY     APPENDECTOMY  2009   C5 FUSION     C6 FUSION     C7 FUSION     COLONOSCOPY  02/2006   COLONOSCOPY WITH PROPOFOL  N/A 12/25/2016   Procedure: COLONOSCOPY WITH PROPOFOL ;  Surgeon: Marshall Skeeter, MD;  Location: [facility];  Service: Endoscopy;  Laterality: N/A;   ESOPHAGOGASTRODUODENOSCOPY (EGD) WITH PROPOFOL  N/A 12/25/2016   Procedure: ESOPHAGOGASTRODUODENOSCOPY (EGD) WITH PROPOFOL ;  Surgeon: Marshall Skeeter, MD;  Location: [facility];  Service: Endoscopy;  Laterality: N/A;   FOOT SURGERY     HIP ARTHROPLASTY     L4 FUSION     L5 FUSION     S1 FUSION     HPI:  Abigail [name] is a 58 y.o. female with medical history significant for chronic pancreatitis/pancreatic pseudocyst, chronic back pain pain on opiates, opioid-induced constipation , seizure disorder, chronic hypoxic respiratory failure on prn O2 at 2 L,,frequent hospitalizations most recently from 3/15 to 11/05/2023 with aspiration pneumonia and  hypoxia, discharged on room air with continuation of as needed O2 who is now being admitted with acute respiratory failure with hypercapnia after presenting with confusion.  Patient was unable to be contacted for 2 days (caregiver was not around )and when relative checked in on her, she was found confused and unable to recognize the relative prompting a call to EMS.  Of note, patient was seen by her neurologist on 4/28 (note reviewed) with a diagnosis of intractable epilepsy with breakthrough seizures and was found to have subtherapeutic levels of her Keppra , Lamictal  and Tegretol  which was attributed to noncompliance.  Chest x- ray revealed low lung volumes with mild bibasilar linear scarring and/or atelectasis. CT Abdomen was  Positive for single subsegmental pulmonary embolism in the right lower lobe. Atelectasis and small pleural effusions.  Numerous chronic compression fractures, T10 and T12 fractures are unhealed. Abdomen: Generalized colitis. Chronic pancreatitis with numerous pseudo cysts. Dominant cyst at the pancreatic tail and dissecting around the upper spleen has increased in size to 6.7 cm, but no signs of super infection. Mass effect on the stomach is unchanged. Atherosclerosis, liver steatosis. Chronic gastric varices.    Assessment / Plan / Recommendation  Clinical Impression  Pt presents with what appears to be a cognition based dysphagia related to inability to sustain attention to bolus, difficulty achieving positioning in bed d/t inability to follow directions and report of inability to eat "because it hurts my  stomach."       Pt is well known to ST services for assessment and treatment of cognition based dysphagia    12/20/2022   07/02/2023    09/21/2023   11/02/2023 - discharged on regular with thin liquids  Pt presents with continued reduced mentation, initially refusing all trials of PO intake d/t "it makes my stomach hurt." Pt unable to rate pain but was agreeable to trials with  maximal encouragement. Pt not able to participate in improving positioning bed for safe PO consumption. When consuming ice chips via spoon, pt was free of any overt s/s of aspiration. After 2 trials of ice chips, it became more difficult to engage pt in task. With maximal cues, she consumed 2 trials of thin liquids via straw. She was observed with immediate coughing. Suspect this is likely related to poor positioning, reduced attention to bolus and overall reduced cognitive ability.   At this time, recommend continuation of ice chips when pt is alert and medicine crushed in puree.   ST will follow for possible diet advancement as pt's mentation improves.     SLP Visit Diagnosis: Dysphagia, unspecified (R13.10)    Aspiration Risk  Mild aspiration risk;Moderate aspiration risk;Risk for inadequate nutrition/hydration    Diet Recommendation NPO except meds;Ice chips PRN after oral care    Liquid Administration via: Spoon Medication Administration: Crushed with puree Supervision: Full supervision/cueing for compensatory strategies;Staff to assist with self feeding Compensations: Minimize environmental distractions;Slow rate;Small sips/bites Postural Changes: Seated upright at 90 degrees;Remain upright for at least 30 minutes after po intake    Other  Recommendations Oral Care Recommendations: Oral care QID;Oral care prior to ice chip/H20    Recommendations for follow up therapy are one component of a multi-disciplinary discharge planning process, led by the attending physician.  Recommendations may be updated based on patient status, additional functional criteria and insurance authorization.  Follow up Recommendations Follow physician's recommendations for discharge plan and follow up therapies      Assistance Recommended at Discharge  Given lack of medication compliance, suspect pt would benefit from increased supervision.   Functional Status Assessment Patient has had a recent decline in  their functional status and/or demonstrates limited ability to make significant improvements in function in a reasonable and predictable amount of time  Frequency and Duration min 2x/week  2 weeks       Prognosis Prognosis for improved oropharyngeal function: Guarded Barriers to Reach Goals: Cognitive deficits;Severity of deficits;Medication;Behavior Barriers/Prognosis Comment: multiple recent hospitalizations for encephalopathy      Swallow Study   General Date of Onset: 12/28/23 HPI: Abbigaile Lepage is a 58 y.o. female with medical history significant for chronic pancreatitis/pancreatic pseudocyst, chronic back pain pain on opiates, opioid-induced constipation , seizure disorder, chronic hypoxic respiratory failure on prn O2 at 2 L,,frequent hospitalizations most recently from 3/15 to 11/05/2023 with aspiration pneumonia and hypoxia, discharged on room air with continuation of as needed O2 who is now being admitted with acute respiratory failure with hypercapnia after presenting with confusion.  Patient was unable to be contacted for 2 days (caregiver was not around )and when relative checked in on her, she was found confused and unable to recognize the relative prompting a call to EMS.  Of note, patient was seen by her neurologist on 4/28 (note reviewed) with a diagnosis of intractable epilepsy with breakthrough seizures and was found to have subtherapeutic levels of her Keppra , Lamictal  and Tegretol  which was attributed to noncompliance.  Chest x- ray  revealed low lung volumes with mild bibasilar linear scarring and/or atelectasis. CT Abdomen was  Positive for single subsegmental pulmonary embolism in the right lower lobe. Atelectasis and small pleural effusions.  Numerous chronic compression fractures, T10 and T12 fractures are unhealed. Abdomen: Generalized colitis. Chronic pancreatitis with numerous pseudo cysts. Dominant cyst at the pancreatic tail and dissecting around the upper  spleen has increased in size to 6.7 cm, but no signs of super infection. Mass effect on the stomach is unchanged. Atherosclerosis, liver steatosis. Chronic gastric varices. Type of Study: Bedside Swallow Evaluation Previous Swallow Assessment: multiple most recent 10/2023 Diet Prior to this Study: NPO Temperature Spikes Noted: No Respiratory Status: Room air History of Recent Intubation: No Behavior/Cognition: Confused;Uncooperative;Distractible;Requires cueing;Doesn't follow directions;Lethargic/Drowsy Oral Cavity Assessment: Within Functional Limits Oral Care Completed by SLP: Recent completion by staff Self-Feeding Abilities: Total assist Patient Positioning: Postural control adequate for testing Baseline Vocal Quality: Normal Volitional Cough: Cognitively unable to elicit Volitional Swallow: Unable to elicit    Oral/Motor/Sensory Function Overall Oral Motor/Sensory Function:  (unable to fully assess d/t lack of pt participation)   [place] chips: Within functional limits Presentation: Spoon   Thin Liquid Thin Liquid: Impaired Presentation: Straw Oral Phase Impairments: Poor awareness of bolus Pharyngeal  Phase Impairments: Suspected delayed Swallow;Cough - Immediate    Nectar Thick Nectar Thick Liquid: Not tested   Honey Thick Honey Thick Liquid: Not tested   Puree Puree: Not tested   Solid     Solid: Not tested     Holten Spano B. Garlin Junker, M.S., CCC-SLP, Tree surgeon Certified Brain Injury Specialist [facility]  [facility] Rehabilitation Services Office [phone] Ascom [phone] Fax [phone]

## 2023-12-30 NOTE — Progress Notes (Addendum)
 PROGRESS NOTE    L[facility] Maile [facility]  VWU:[id number] DOB: 1965/12/23 DOA: 12/28/2023 PCP: [name], Susan K, MD  Outpatient Specialists: neurology    Brief Narrative:   Abigail [name] is a 58 y.o. female with medical history significant for chronic pancreatitis/pancreatic pseudocyst, chronic back pain pain on opiates, opioid-induced constipation , seizure disorder, chronic hypoxic respiratory failure on prn O2 at 2 L,,frequent hospitalizations most recently from 3/15 to 11/05/2023 with aspiration pneumonia and hypoxia, discharged on room air with continuation of as needed O2 who is now being admitted with acute respiratory failure with hypercapnia after presenting with confusion.  Patient was unable to be contacted for 2 days (caregiver was not around )and when relative checked in on her, she was found confused and unable to recognize the relative prompting a call to EMS.  Of note, patient was seen by her neurologist on 4/28 (note reviewed) with a diagnosis of intractable epilepsy with breakthrough seizures and was found to have subtherapeutic levels of her Keppra , Lamictal  and Tegretol  which was attributed to noncompliance.     Assessment & Plan:   Principal Problem:   Acute on chronic respiratory failure with hypercapnia (HCC) Active Problems:   Acute metabolic encephalopathy   Rheumatoid arthritis (HCC)   Asthma   Epilepsy, intractable (HCC)   Chronic respiratory failure with hypoxia (HCC)   Chronic pancreatitis (HCC)   Hypothyroidism   COPD (chronic obstructive pulmonary disease) (HCC)  # Acute encephalopathy Etiology unclear. Seizure/post-ictal, hypercarbia, infection (rhinovirus), opioid overdose among possible etiologies (alone or in concert). Improving. CT head nothing acute  # Diarrhea # Fever # Rhinovirus Diarrhea and fever resolved. Respiratory panel positive for rhinovirus. Nothing aucte on ct f abdomen - supportive care - monitor cultures  # Seizure  disorder With med noncompliance - resume home oral meds now that she's taking po (keppra , carbamazepine , lamotrigine )  # Acute on chronic hypoxic respiratory failure # Acute subsegmental PE Hypercarbic on arrival, is on prn o2 at home. CTA showing small subsegmental acute PE. Stable on home oxygen. Carbamazepine  unfortunately reduces effectiveness of all DOACs. Do we need to treat this small incidental PE? - continue heparin  - check dimer, PVL  # Hypothyroid? Don't see she's on home meds. TSH wnl  # Elevated troponin EKG non-ischemic - repeat troponin  # Chronic pain - resume home duloxetine    - gabapentin  on hold 2/2 sedation - home morphine  on hold 2/2 encephalopathy  # MDD - hold home mirtazapine  2/2 sedation  # Hypokalemia - replete  # Hx GCA Finished steroids in 2024  # RA Doesn't appear currently on meds   DVT prophylaxis: lovenox  Code Status: full Family Communication: mother telephonically 5/12  Level of care: Progressive Status is: inpatient    Consultants:  neurology  Procedures: none  Antimicrobials:  None thus far    Subjective: Sleeping, arouses, more alert today  Objective: Vitals:   12/30/23 1133 12/30/23 1134 12/30/23 1135 12/30/23 1200  BP:    121/71  Pulse: (!) 57 (!) 53 (!) 55 64  Resp: 14 19 15 10   Temp:      TempSrc:      SpO2: 98% 98% 98% 98%  Weight:      Height:        Intake/Output Summary (Last 24 hours) at 12/30/2023 1355 Last data filed at 12/29/2023 1414 Gross per 24 hour  Intake 25 ml  Output --  Net 25 ml   Filed Weights   12/28/23 2352  Weight: 59.9 kg  Examination:  General exam: disheveled, no acute distress Respiratory system: Clear save for basilar rales Cardiovascular system: S1 & S2 heard, RRR.   Gastrointestinal system: Abdomen is nondistended, soft   Central nervous system: somnolent. Moving all 4 Extremities: trace LE edema Skin: scattered abrasions Psychiatry: calm    Data  Reviewed: I have personally reviewed following labs and imaging studies  CBC: Recent Labs  Lab 12/29/23 0020 12/30/23 0409  WBC 8.2 8.5  NEUTROABS 6.7  --   HGB 12.2 9.7*  HCT 38.2 30.1*  MCV 100.5* 99.3  PLT 247 211   Basic Metabolic Panel: Recent Labs  Lab 12/29/23 0020 12/29/23 0028 12/29/23 1200 12/30/23 0409  NA 139  --  134* 137  K 2.8*  --  3.6 3.7  CL 95*  --  96* 100  CO2 33*  --  29 29  GLUCOSE 124*  --  115* 110*  BUN 12  --  12 12  CREATININE 0.50  --  0.41* 0.51  CALCIUM  8.2*  --  7.5* 7.5*  MG  --  2.2  --   --    GFR: Estimated Creatinine Clearance: 72.5 mL/min (by C-G formula based on SCr of 0.51 mg/dL). Liver Function Tests: Recent Labs  Lab 12/29/23 0020  AST 29  ALT 13  ALKPHOS 118  BILITOT 0.8  PROT 6.7  ALBUMIN 2.0*   Recent Labs  Lab 12/29/23 0020  LIPASE 51   No results for input(s): "AMMONIA" in the last 168 hours. Coagulation Profile: Recent Labs  Lab 12/29/23 0020  INR 1.0   Cardiac Enzymes: No results for input(s): "CKTOTAL", "CKMB", "CKMBINDEX", "TROPONINI" in the last 168 hours. BNP (last 3 results) No results for input(s): "PROBNP" in the last 8760 hours. HbA1C: No results for input(s): "HGBA1C" in the last 72 hours. CBG: No results for input(s): "GLUCAP" in the last 168 hours. Lipid Profile: No results for input(s): "CHOL", "HDL", "LDLCALC", "TRIG", "CHOLHDL", "LDLDIRECT" in the last 72 hours. Thyroid  Function Tests: Recent Labs    12/29/23 0130  TSH 1.260   Anemia Panel: No results for input(s): "VITAMINB12", "FOLATE", "FERRITIN", "TIBC", "IRON", "RETICCTPCT" in the last 72 hours. Urine analysis:    Component Value Date/Time   COLORURINE AMBER (A) 12/29/2023 0126   APPEARANCEUR CLEAR (A) 12/29/2023 0126   APPEARANCEUR Clear 05/24/2014 1903   LABSPEC 1.021 12/29/2023 0126   LABSPEC 1.015 05/24/2014 1903   PHURINE 6.0 12/29/2023 0126   GLUCOSEU NEGATIVE 12/29/2023 0126   GLUCOSEU Negative 05/24/2014 1903    HGBUR NEGATIVE 12/29/2023 0126   BILIRUBINUR NEGATIVE 12/29/2023 0126   BILIRUBINUR Negative 05/24/2014 1903   KETONESUR 5 (A) 12/29/2023 0126   PROTEINUR 30 (A) 12/29/2023 0126   NITRITE NEGATIVE 12/29/2023 0126   LEUKOCYTESUR NEGATIVE 12/29/2023 0126   LEUKOCYTESUR Negative 05/24/2014 1903   Sepsis Labs: @LABRCNTIP (procalcitonin:4,lacticidven:4)  ) Recent Results (from the past 240 hours)  Blood culture (routine single)     Status: None (Preliminary result)   Collection Time: 12/29/23 12:20 AM   Specimen: BLOOD  Result Value Ref Range Status   Specimen Description BLOOD BLOOD LEFT ARM  Final   Special Requests   Final    BOTTLES DRAWN AEROBIC AND ANAEROBIC Blood Culture results may not be optimal due to an excessive volume of blood received in culture bottles   Culture   Final    NO GROWTH 1 DAY Performed at [facility], [address], [place], [place] [address]    Report Status PENDING  Incomplete  C Difficile Quick Screen w PCR reflex     Status: None   Collection Time: 12/29/23  6:23 AM   Specimen: STOOL  Result Value Ref Range Status   C Diff antigen NEGATIVE NEGATIVE Final   C Diff toxin NEGATIVE NEGATIVE Final   C Diff interpretation No C. difficile detected.  Final    Comment: Performed at [facility], [address] [place], [place] [address]  Gastrointestinal Panel by PCR , Stool     Status: None   Collection Time: 12/29/23  6:23 AM   Specimen: Stool  Result Value Ref Range Status   Campylobacter species NOT DETECTED NOT DETECTED Final   Plesimonas shigelloides NOT DETECTED NOT DETECTED Final   Salmonella species NOT DETECTED NOT DETECTED Final   Yersinia enterocolitica NOT DETECTED NOT DETECTED Final   Vibrio species NOT DETECTED NOT DETECTED Final   Vibrio cholerae NOT DETECTED NOT DETECTED Final   Enteroaggregative E coli (EAEC) NOT DETECTED NOT DETECTED Final   Enteropathogenic E coli (EPEC) NOT DETECTED NOT DETECTED Final    Enterotoxigenic E coli (ETEC) NOT DETECTED NOT DETECTED Final   Shiga like toxin producing E coli (STEC) NOT DETECTED NOT DETECTED Final   Shigella/Enteroinvasive E coli (EIEC) NOT DETECTED NOT DETECTED Final   Cryptosporidium NOT DETECTED NOT DETECTED Final   Cyclospora cayetanensis NOT DETECTED NOT DETECTED Final   Entamoeba histolytica NOT DETECTED NOT DETECTED Final   Giardia lamblia NOT DETECTED NOT DETECTED Final   Adenovirus F40/41 NOT DETECTED NOT DETECTED Final   Astrovirus NOT DETECTED NOT DETECTED Final   Norovirus GI/GII NOT DETECTED NOT DETECTED Final   Rotavirus A NOT DETECTED NOT DETECTED Final   Sapovirus (I, II, IV, and V) NOT DETECTED NOT DETECTED Final    Comment: Performed at [facility], [address] [place], [place] [address]  SARS Coronavirus 2 by RT PCR (hospital order, performed in [facility] hospital lab) *cepheid single result test* Anterior Nasal Swab     Status: None   Collection Time: 12/29/23  9:44 AM   Specimen: Anterior Nasal Swab  Result Value Ref Range Status   SARS Coronavirus 2 by RT PCR NEGATIVE NEGATIVE Final    Comment: (NOTE) SARS-CoV-2 target nucleic acids are NOT DETECTED.  The SARS-CoV-2 RNA is generally detectable in upper and lower respiratory specimens during the acute phase of infection. The lowest concentration of SARS-CoV-2 viral copies this assay can detect is 250 copies / mL. A negative result does not preclude SARS-CoV-2 infection and should not be used as the sole basis for treatment or other patient management decisions.  A negative result may occur with improper specimen collection / handling, submission of specimen other than nasopharyngeal swab, presence of viral mutation(s) within the areas targeted by this assay, and inadequate number of viral copies (<250 copies / mL). A negative result must be combined with clinical observations, patient history, and epidemiological information.  Fact Sheet for Patients:    RoadLapTop.co.za  Fact Sheet for Healthcare Providers: http://kim-miller.com/  This test is not yet approved or  cleared by the [place]  FDA and has been authorized for detection and/or diagnosis of SARS-CoV-2 by FDA under an Emergency Use Authorization (EUA).  This EUA will remain in effect (meaning this test can be used) for the duration of the COVID-19 declaration under Section 564(b)(1) of the Act, 21 U.S.C. section 360bbb-3(b)(1), unless the authorization is terminated or revoked sooner.  Performed at [facility], [address]  [place], [place] [address]   Respiratory (~20 pathogens) panel by PCR     Status: Abnormal   Collection Time: 12/29/23  9:44 AM   Specimen: Nasopharyngeal Swab; Respiratory  Result Value Ref Range Status   Adenovirus NOT DETECTED NOT DETECTED Final   Coronavirus 229E NOT DETECTED NOT DETECTED Final    Comment: (NOTE) The Coronavirus on the Respiratory Panel, DOES NOT test for the novel  Coronavirus (2019 nCoV)    Coronavirus HKU1 NOT DETECTED NOT DETECTED Final   Coronavirus NL63 NOT DETECTED NOT DETECTED Final   Coronavirus OC43 NOT DETECTED NOT DETECTED Final   Metapneumovirus NOT DETECTED NOT DETECTED Final   Rhinovirus / Enterovirus DETECTED (A) NOT DETECTED Final   Influenza A NOT DETECTED NOT DETECTED Final   Influenza B NOT DETECTED NOT DETECTED Final   Parainfluenza Virus 1 NOT DETECTED NOT DETECTED Final   Parainfluenza Virus 2 NOT DETECTED NOT DETECTED Final   Parainfluenza Virus 3 NOT DETECTED NOT DETECTED Final   Parainfluenza Virus 4 NOT DETECTED NOT DETECTED Final   Respiratory Syncytial Virus NOT DETECTED NOT DETECTED Final   Bordetella pertussis NOT DETECTED NOT DETECTED Final   Bordetella Parapertussis NOT DETECTED NOT DETECTED Final   Chlamydophila pneumoniae NOT DETECTED NOT DETECTED Final   Mycoplasma pneumoniae NOT DETECTED NOT DETECTED Final    Comment:  Performed at [facility], [address] [place], [place] [address]         Radiology Studies: CT ABDOMEN PELVIS W CONTRAST Result Date: 12/29/2023 CLINICAL DATA:  Encephalopathy with fever and diarrhea. EXAM: CT ANGIOGRAPHY CHEST CT ABDOMEN AND PELVIS WITH CONTRAST TECHNIQUE: Multidetector CT imaging of the chest was performed using the standard protocol during bolus administration of intravenous contrast. Multiplanar CT image reconstructions and MIPs were obtained to evaluate the vascular anatomy. Multidetector CT imaging of the abdomen and pelvis was performed using the standard protocol during bolus administration of intravenous contrast. RADIATION DOSE REDUCTION: This exam was performed according to the departmental dose-optimization program which includes automated exposure control, adjustment of the mA and/or kV according to patient size and/or use of iterative reconstruction technique. CONTRAST:  OMNIPAQUE  IOHEXOL  300 MG/ML  SOLN COMPARISON:  Abdomen and pelvis CT 11/02/2023.  Chest CT 07/15/2023 FINDINGS: CTA CHEST FINDINGS Cardiovascular: Satisfactory opacification of the pulmonary arteries to the segmental level. Single subsegmental pulmonary embolism into the basal right lower lobe. Normal heart size. No pericardial effusion. Mediastinum/Nodes: Negative for mass or adenopathy. No esophageal thickening. Lungs/Pleura: The central airways are clear. Generalized airway thickening with mild atelectasis and trace bilateral pleural effusion. Musculoskeletal: Numerous compression fractures seen at the T1, T2, and T3 superior endplates with mild depression. More moderate to advanced compressive deformities at T5, T7, T8, T9, T10, and T11. At T10 and T11 fractures are new from 2020 for and associated with gas containing cleft. Lower thoracic disc space narrowing and facet spurring with foraminal stenoses. Remote rib fractures. Review of the MIP images confirms the above findings. CT ABDOMEN  and PELVIS FINDINGS Hepatobiliary: Hepatic steatosis. No focal abnormality.No evidence of biliary obstruction or stone. Pancreas: Sequela of chronic pancreatitis with multiple pseudocysts. All of the current pseudocysts were seen on prior, dominant cyst at the pancreatic tail and splenic hilum measures 6.7 cm, increased with similar degree of tracking along the upper aspect of the spleen. None of the cysts show interval gas or other definitive sign of hemorrhage or infection. Active pancreatitis is likely present given the degree of upper abdominal stranding. Small but  notable cyst tracking upward at the gastrohepatic ligament. Spleen: Pseudocysts tracking along the upper spleen as noted above. Adrenals/Urinary Tract: Negative adrenals. No hydronephrosis or stone. Unremarkable bladder. Stomach/Bowel: Generalized submucosal edema affecting the colon. No bowel obstruction. 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 No acute vascular finding. Extensive atheromatous calcification of the aorta and iliacs. Reproductive:No pathologic findings. Other: No ascites or pneumoperitoneum. Musculoskeletal: No acute finding. Remote T12, L1, and L2 compression fractures. Anterior lumbar fusion with solid arthrodesis spanning L3-S1. Total right hip arthroplasty. Critical Value/emergent results were called by telephone at the time of interpretation on 12/29/2023 at 10:29 am to provider [facility] , who verbally acknowledged these results. Review of the MIP images confirms the above findings. IMPRESSION: Chest CT: Positive for single subsegmental pulmonary embolism in the right lower lobe. Atelectasis and small pleural effusions. Numerous chronic compression fractures, T10 and T12 fractures are unhealed. Abdomen: Generalized colitis. Chronic pancreatitis with numerous pseudo cysts. Dominant cyst at the pancreatic tail and dissecting around the upper spleen has increased in size to 6.7 cm, but no signs  of super infection. Mass effect on the stomach is unchanged. Atherosclerosis, liver steatosis. Chronic gastric varices. Electronically Signed   By: Ronnette Coke M.D.   On: 12/29/2023 10:30   CT Angio Chest Pulmonary Embolism (PE) W or WO Contrast Result Date: 12/29/2023 CLINICAL DATA:  Encephalopathy with fever and diarrhea. EXAM: CT ANGIOGRAPHY CHEST CT ABDOMEN AND PELVIS WITH CONTRAST TECHNIQUE: Multidetector CT imaging of the chest was performed using the standard protocol during bolus administration of intravenous contrast. Multiplanar CT image reconstructions and MIPs were obtained to evaluate the vascular anatomy. Multidetector CT imaging of the abdomen and pelvis was performed using the standard protocol during bolus administration of intravenous contrast. RADIATION DOSE REDUCTION: This exam was performed according to the departmental dose-optimization program which includes automated exposure control, adjustment of the mA and/or kV according to patient size and/or use of iterative reconstruction technique. CONTRAST:  OMNIPAQUE  IOHEXOL  300 MG/ML  SOLN COMPARISON:  Abdomen and pelvis CT 11/02/2023.  Chest CT 07/15/2023 FINDINGS: CTA CHEST FINDINGS Cardiovascular: Satisfactory opacification of the pulmonary arteries to the segmental level. Single subsegmental pulmonary embolism into the basal right lower lobe. Normal heart size. No pericardial effusion. Mediastinum/Nodes: Negative for mass or adenopathy. No esophageal thickening. Lungs/Pleura: The central airways are clear. Generalized airway thickening with mild atelectasis and trace bilateral pleural effusion. Musculoskeletal: Numerous compression fractures seen at the T1, T2, and T3 superior endplates with mild depression. More moderate to advanced compressive deformities at T5, T7, T8, T9, T10, and T11. At T10 and T11 fractures are new from 2020 for and associated with gas containing cleft. Lower thoracic disc space narrowing and facet spurring  with foraminal stenoses. Remote rib fractures. Review of the MIP images confirms the above findings. CT ABDOMEN and PELVIS FINDINGS Hepatobiliary: Hepatic steatosis. No focal abnormality.No evidence of biliary obstruction or stone. Pancreas: Sequela of chronic pancreatitis with multiple pseudocysts. All of the current pseudocysts were seen on prior, dominant cyst at the pancreatic tail and splenic hilum measures 6.7 cm, increased with similar degree of tracking along the upper aspect of the spleen. None of the cysts show interval gas or other definitive sign of hemorrhage or infection. Active pancreatitis is likely present given the degree of upper abdominal stranding. Small but notable cyst tracking upward at the gastrohepatic ligament. Spleen: Pseudocysts tracking along the upper spleen as noted above. Adrenals/Urinary Tract: Negative adrenals. No hydronephrosis or stone. Unremarkable bladder. Stomach/Bowel:  Generalized submucosal edema affecting the colon. No bowel obstruction. 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 No acute vascular finding. Extensive atheromatous calcification of the aorta and iliacs. Reproductive:No pathologic findings. Other: No ascites or pneumoperitoneum. Musculoskeletal: No acute finding. Remote T12, L1, and L2 compression fractures. Anterior lumbar fusion with solid arthrodesis spanning L3-S1. Total right hip arthroplasty. Critical Value/emergent results were called by telephone at the time of interpretation on 12/29/2023 at 10:29 am to provider [facility] , who verbally acknowledged these results. Review of the MIP images confirms the above findings. IMPRESSION: Chest CT: Positive for single subsegmental pulmonary embolism in the right lower lobe. Atelectasis and small pleural effusions. Numerous chronic compression fractures, T10 and T12 fractures are unhealed. Abdomen: Generalized colitis. Chronic pancreatitis with numerous pseudo cysts. Dominant  cyst at the pancreatic tail and dissecting around the upper spleen has increased in size to 6.7 cm, but no signs of super infection. Mass effect on the stomach is unchanged. Atherosclerosis, liver steatosis. Chronic gastric varices. Electronically Signed   By: Ronnette Coke M.D.   On: 12/29/2023 10:30   CT HEAD WO CONTRAST ( ) Result Date: 12/29/2023 CLINICAL DATA:  Encephalopathy.  Intractable epilepsy EXAM: CT HEAD WITHOUT CONTRAST TECHNIQUE: Contiguous axial images were obtained from the base of the skull through the vertex without intravenous contrast. RADIATION DOSE REDUCTION: This exam was performed according to the departmental dose-optimization program which includes automated exposure control, adjustment of the mA and/or kV according to patient size and/or use of iterative reconstruction technique. COMPARISON:  10/08/2023 FINDINGS: Brain: No evidence of acute infarction, hemorrhage, hydrocephalus, extra-axial collection or mass lesion/mass effect. Motion artifact towards the vertex. Vascular: No hyperdense vessel or unexpected calcification. Skull: Normal. Negative for fracture or focal lesion. Sinuses/Orbits: No acute finding IMPRESSION: Negative motion degraded head CT. Electronically Signed   By: Ronnette Coke M.D.   On: 12/29/2023 09:52   DG Chest Port 1 View Result Date: 12/29/2023 CLINICAL DATA:  Altered mental status and failure to thrive. EXAM: PORTABLE CHEST 1 VIEW COMPARISON:  November 02, 2023 FINDINGS: The heart size and mediastinal contours are within normal limits. Low lung volumes are noted. Mild areas of linear scarring and/or atelectasis are seen within the bilateral lung bases. No pleural effusion or pneumothorax is identified. Postoperative changes are seen within the lower cervical spine. Multilevel degenerative changes are present throughout the thoracic spine. IMPRESSION: Low lung volumes with mild bibasilar linear scarring and/or atelectasis. Electronically Signed   By:  Virgle Grime M.D.   On: 12/29/2023 01:07        Scheduled Meds:  carbamazepine   100 mg Oral BID   lamoTRIgine   50 mg Oral BID   levETIRAcetam   1,500 mg Intravenous Q12H   mirtazapine   30 mg Oral QHS   mouth rinse  15 mL Mouth Rinse Q2H   pantoprazole  (PROTONIX ) IV  40 mg Intravenous Q24H   Continuous Infusions:  heparin  1,300 Units/hr (12/30/23 1248)   lacosamide  (VIMPAT ) IV Stopped (12/30/23 1121)     LOS: 1 day   CRITICAL CARE Performed by: Josem Nick, MD [facility]   If 7PM-7AM, please contact night-coverage [email or web address] Password [email or web address] 12/30/2023, 1:55 PM

## 2023-12-30 NOTE — Progress Notes (Addendum)
 PHARMACY - ANTICOAGULATION CONSULT NOTE  Pharmacy Consult for heparin  infusion  Indication: pulmonary embolus  Allergies  Allergen Reactions   Nsaids Hives   Tapentadol Swelling, Rash and Other (See Comments)    Nucynta- Made her deathly sick   Cephalexin  Other (See Comments)    Reaction not cited   Codeine Other (See Comments)    Reaction not cited   Darvocet [Propoxyphene N-Acetaminophen ] Other (See Comments)    Reaction not cited   Latex Other (See Comments)    Reaction not cited   Silicone Other (See Comments)    Reaction not cited   Sulfa Antibiotics Hives   Tape Other (See Comments)    Reaction not cited   Meloxicam Rash    Patient Measurements: Height: 5\' 8"  (172.7 cm) Weight: 59.9 kg (132 lb 0.9 oz) IBW/kg (Calculated) : 63.9 HEPARIN  DW (KG): 59.9  Vital Signs: Temp: 99.1 F (37.3 C) (05/12 1915) Temp Source: Oral (05/12 1915) BP: 128/76 (05/12 1915) Pulse Rate: 91 (05/12 1915)  Labs: Recent Labs    12/29/23 0020 12/29/23 1200 12/29/23 1842 12/30/23 0037 12/30/23 0409 12/30/23 0935 12/30/23 2120  HGB 12.2  --   --   --  9.7*  --   --   HCT 38.2  --   --   --  30.1*  --   --   PLT 247  --   --   --  211  --   --   LABPROT 13.4  --   --   --   --   --   --   INR 1.0  --   --   --   --   --   --   HEPARINUNFRC  --   --    < > 0.18*  --  0.17* <0.10*  CREATININE 0.50 0.41*  --   --  0.51  --   --   TROPONINIHS 49* 37*  --   --   --   --   --    < > = values in this interval not displayed.    Estimated Creatinine Clearance: 72.5 mL/min (by C-G formula based on SCr of 0.51 mg/dL).   Medical History: Past Medical History:  Diagnosis Date   Anxiety    Arthritis    Back pain    Basal cell carcinoma 10/04/2021   R axilla - needs excised 11/28/21   Basal cell carcinoma 10/04/2021   L antecubital excised 11/14/21   Diarrhea 11/12/2016   Fibromyalgia    Generalized abdominal pain 11/12/2016   H. pylori infection    Hyperlipidemia    IBS  (irritable bowel syndrome)    Infectious colitis 04/29/2016   Migraines    Moderate dehydration 04/29/2016   Muscle pain    Opioid overdose (HCC) 12/07/2022   Reflux    Seizures (HCC)    Unexplained weight loss 11/12/2016    Medications:  Scheduled:   carbamazepine   100 mg Oral BID   DULoxetine   60 mg Oral BID   lamoTRIgine   50 mg Oral BID   levETIRAcetam   1,500 mg Oral BID   mouth rinse  15 mL Mouth Rinse Q2H   pantoprazole  (PROTONIX ) IV  40 mg Intravenous Q24H    Assessment: 58 yo female admitted with acute on chronic respiratory failure and acute encephalopathy.  CT Angio Chest positive for single subsegmental pulmonary embolism in the right lower lobe. Pharmacy has been consulted for heparin  dosing and monitoring. Patient did received enoxaparin  40mg   x 1 dose this morning @ 0930.   Goal of Therapy:  Heparin  level 0.3-0.7 units/ml Monitor platelets by anticoagulation protocol: Yes  0511 @ 1842: HL 0.52 = Therapeutic x 1 0512 0037 HL 0.18, subtherapeutic 0512 0935 HL 0.17 0512 2120 HL < 0.1   Plan:  Contacted RN, no known line issues.  Heparin  infusion running. Bolus 1800 units x 1 Increase heparin  infusion to 1500 units/hr Recheck anti-Xa level w/ AM labs after rate change Continue to monitor H&H and platelets  Coretta Dexter, PharmD, [facility] 12/30/2023 10:41 PM

## 2023-12-30 NOTE — Evaluation (Signed)
 Physical Therapy Evaluation Patient Details Name: Abigail [name] [facility] MRN: [id number] DOB: [birth year] Today's Date: 12/30/2023  History of Present Illness  Pt is a 58 y/o F admitted on 12/28/23 after presenting with confusion. Pt is being treated for acute on chronic respiratory failure with hypercapnia, acute metabolic encephalopathy. Pt also found to have acute PE. PMH: chronic pancreatitis/pancreatic pseudocyst, chronic back pain, opioid induced constipation, seizures, chronic hypoxic respiratory failure on PRN O2, fibromyalgia, basal cell carcinoma  Clinical Impression  Pt seen for PT evaluation with pt received in bed. Pt does not provide a lot of PLOF but does report she lives with her son who's home all the time, pt primarily stays in bed, but does walk to bathroom PRN. Pt requires MAX encouragement & education re: participation, min assist for supine<>sit. Pt with ongoing decreased engagement during session, only repeating "it hurts", declining standing attempts, won't initiate returning to supine from sitting EOB, does not assist with rolling L<>R to allow PT & nurse to doff soiled brief. Unable to get good idea of pt's CLOF 2/2 pt's reduced willingness to initiate anything during session, ignoring PT & nurse at times. MD made aware of session's events. Pt also received on 2L/min via nasal cannula, weaned to room air with SpO2 >/= 91%. Will continue to follow pt acutely to progress mobility as able.        If plan is discharge home, recommend the following: A lot of help with walking and/or transfers;A lot of help with bathing/dressing/bathroom;Assist for transportation;Help with stairs or ramp for entrance;Assistance with cooking/housework   Can travel by Doctor, hospital (measurements PT);Hospital bed;Wheelchair cushion (measurements PT)  Recommendations for Other Services       Functional Status Assessment Patient has had a recent  decline in their functional status and demonstrates the ability to make significant improvements in function in a reasonable and predictable amount of time.     Precautions / Restrictions Precautions Precautions: Fall Precaution/Restrictions Comments: watch O2 Restrictions Weight Bearing Restrictions Per Provider Order: No      Mobility  Bed Mobility Overal bed mobility: Needs Assistance Bed Mobility: Supine to Sit, Sit to Supine, Rolling Rolling: +2 for safety/equipment (2/2 pt's unwillingness to participate)   Supine to sit: Min assist Sit to supine: Min assist        Transfers                        Ambulation/Gait                  Stairs            Wheelchair Mobility     Tilt Bed    Modified Rankin (Stroke Patients Only)       Balance Overall balance assessment: Needs assistance Sitting-balance support: Feet unsupported, No upper extremity supported Sitting balance-Leahy Scale: Fair Sitting balance - Comments: sits EOB without LOB                                     Pertinent Vitals/Pain Pain Assessment Pain Assessment: Faces Faces Pain Scale: Hurts whole lot Pain Location: "it hurts everywhere" Pain Descriptors / Indicators: Grimacing, Discomfort, Guarding, Moaning Pain Intervention(s): Monitored during session    Home Living Family/patient expects to be discharged to:: Private residence Living Arrangements: Children Available Help at Discharge: Family;Available 24 hours/day  Type of Home: House Home Access: Ramped entrance       Home Layout: One level Home Equipment: Agricultural consultant (2 wheels)      Prior Function               Mobility Comments: Pt does not provide a lot of information but does tell MD she walks with a walker at home. Pt reports she spends the majority of the day in the bed but walks to the bathroom PRN. ADLs Comments: Reports she performs her own peri hygiene then states her son  assists her.     Extremity/Trunk Assessment   Upper Extremity Assessment Upper Extremity Assessment: Generalized weakness    Lower Extremity Assessment Lower Extremity Assessment: Generalized weakness    Cervical / Trunk Assessment Cervical / Trunk Assessment: Kyphotic  Communication        Cognition Arousal: Lethargic Behavior During Therapy: Flat affect                           PT - Cognition Comments: Pt not very willing to interact with PT or nurse who enters room during session. Pt refused to open her eyes stating "it hurts", declines attempting standing EOB to allow PT to doff soiled brief, does not assist with rolling in bed despite max encouragement/education. Following commands: Impaired Following commands impaired: Follows one step commands with increased time, Follows one step commands inconsistently     Cueing Cueing Techniques: Verbal cues, Tactile cues, Gestural cues     General Comments      Exercises     Assessment/Plan    PT Assessment Patient needs continued PT services  PT Problem List Cardiopulmonary status limiting activity;Pain;Decreased activity tolerance;Decreased mobility;Decreased safety awareness       PT Treatment Interventions DME instruction;Balance training;Neuromuscular re-education;Gait training;Stair training;Functional mobility training;Therapeutic activities;Therapeutic exercise;Patient/family education    PT Goals (Current goals can be found in the Care Plan section)  Acute Rehab PT Goals Patient Stated Goal: none stated PT Goal Formulation: Patient unable to participate in goal setting Time For Goal Achievement: 01/13/24 Potential to Achieve Goals: Fair    Frequency Min 2X/week     Co-evaluation               AM-PAC PT "6 Clicks" Mobility  Outcome Measure Help needed turning from your back to your side while in a flat bed without using bedrails?: A Lot Help needed moving from lying on your back to  sitting on the side of a flat bed without using bedrails?: A Lot Help needed moving to and from a bed to a chair (including a wheelchair)?: Total Help needed standing up from a chair using your arms (e.g., wheelchair or bedside chair)?: Total Help needed to walk in hospital room?: Total Help needed climbing 3-5 steps with a railing? : Total 6 Click Score: 8    End of Session   Activity Tolerance: Other (comment) (pt self limiting) Patient left: in bed;with bed alarm set;with call bell/phone within reach Nurse Communication: Mobility status (O2) PT Visit Diagnosis: Other abnormalities of gait and mobility (R26.89);Pain Pain - part of body:  (low back)    Time: 7341-9379 PT Time Calculation (min) (ACUTE ONLY): 16 min   Charges:   PT Evaluation $PT Eval Moderate Complexity: 1 Mod   PT General Charges $$ ACUTE PT VISIT: 1 Visit         Emaline Handsome, PT, DPT 12/30/23, 11:31 AM   Bettey Browning  Cherissa Hook 12/30/2023, 11:29 AM

## 2023-12-31 ENCOUNTER — Other Ambulatory Visit (HOSPITAL_COMMUNITY): Payer: Self-pay

## 2023-12-31 ENCOUNTER — Inpatient Hospital Stay

## 2023-12-31 ENCOUNTER — Telehealth (HOSPITAL_COMMUNITY): Payer: Self-pay | Admitting: Pharmacy Technician

## 2023-12-31 ENCOUNTER — Ambulatory Visit: Admitting: Family Medicine

## 2023-12-31 DIAGNOSIS — J9622 Acute and chronic respiratory failure with hypercapnia: Secondary | ICD-10-CM | POA: Diagnosis not present

## 2023-12-31 LAB — BASIC METABOLIC PANEL WITH GFR
Anion gap: 8 (ref 5–15)
BUN: 7 mg/dL (ref 6–20)
CO2: 29 mmol/L (ref 22–32)
Calcium: 7.7 mg/dL — ABNORMAL LOW (ref 8.9–10.3)
Chloride: 97 mmol/L — ABNORMAL LOW (ref 98–111)
Creatinine, Ser: 0.34 mg/dL — ABNORMAL LOW (ref 0.44–1.00)
GFR, Estimated: >60 mL/min (ref >60)
Glucose, Bld: 91 mg/dL (ref 70–99)
Potassium: 2.9 mmol/L — ABNORMAL LOW (ref 3.5–5.1)
Sodium: 134 mmol/L — ABNORMAL LOW (ref 135–145)

## 2023-12-31 LAB — LEVETIRACETAM LEVEL: Levetiracetam Lvl: <2 ug/mL — ABNORMAL LOW (ref 10.0–40.0)

## 2023-12-31 LAB — CBC
HCT: 32.2 % — ABNORMAL LOW (ref 36.0–46.0)
Hemoglobin: 10.6 g/dL — ABNORMAL LOW (ref 12.0–15.0)
MCH: 31.7 pg (ref 26.0–34.0)
MCHC: 32.9 g/dL (ref 30.0–36.0)
MCV: 96.4 fL (ref 80.0–100.0)
Platelets: 189 10*3/uL (ref 150–400)
RBC: 3.34 MIL/uL — ABNORMAL LOW (ref 3.87–5.11)
RDW: 13.5 % (ref 11.5–15.5)
WBC: 7.5 10*3/uL (ref 4.0–10.5)
nRBC: 0 % (ref 0.0–0.2)

## 2023-12-31 LAB — HEPARIN LEVEL (UNFRACTIONATED)
Heparin Unfractionated: 0.46 [IU]/mL (ref 0.30–0.70)
Heparin Unfractionated: 0.39 [IU]/mL (ref 0.30–0.70)

## 2023-12-31 LAB — MAGNESIUM: Magnesium: 1.7 mg/dL (ref 1.7–2.4)

## 2023-12-31 MED ORDER — OXYCODONE HCL 5 MG PO TABS
5.0000 mg | ORAL_TABLET | Freq: Four times a day (QID) | ORAL | Status: DC | PRN
  Administered 2023-12-31 – 2024-01-02 (×5): 5 mg via ORAL
  Filled 2023-12-31 (×6): qty 1

## 2023-12-31 MED ORDER — MAGNESIUM SULFATE 2 GM/50ML IV SOLN
2.0000 g | Freq: Once | INTRAVENOUS | Status: AC
  Administered 2023-12-31: 2 g via INTRAVENOUS
  Filled 2023-12-31: qty 50

## 2023-12-31 MED ORDER — ENSURE ENLIVE PO LIQD
237.0000 mL | Freq: Two times a day (BID) | ORAL | Status: DC
  Administered 2023-12-31 – 2024-01-01 (×2): 237 mL via ORAL

## 2023-12-31 MED ORDER — POTASSIUM CHLORIDE 20 MEQ PO PACK
60.0000 meq | PACK | Freq: Once | ORAL | Status: AC
  Administered 2023-12-31: 60 meq via ORAL
  Filled 2023-12-31: qty 3

## 2023-12-31 MED ORDER — APIXABAN 5 MG PO TABS: 5.0000 mg | ORAL_TABLET | Freq: Two times a day (BID) | ORAL | Status: DC

## 2023-12-31 MED ORDER — PANTOPRAZOLE SODIUM 40 MG PO TBEC
40.0000 mg | DELAYED_RELEASE_TABLET | Freq: Every day | ORAL | Status: DC
  Administered 2024-01-01 – 2024-01-02 (×2): 40 mg via ORAL
  Filled 2023-12-31 (×2): qty 1

## 2023-12-31 MED ORDER — APIXABAN 5 MG PO TABS
10.0000 mg | ORAL_TABLET | Freq: Two times a day (BID) | ORAL | Status: DC
  Administered 2023-12-31 – 2024-01-02 (×4): 10 mg via ORAL
  Filled 2023-12-31: qty 4
  Filled 2023-12-31 (×3): qty 2

## 2023-12-31 NOTE — Progress Notes (Signed)
 Notified Hines Ludwig, MD that pt is strict NPO, but all meds are oral, and asked if meds should be held or changed to IV. Pt is lethargic, but awake enough to somewhat hold a conversation.   MD stated that it is okay to give meds with sips of water.   This RN completed a bedside swallow test before giving meds. Pt passed and meds were given.

## 2023-12-31 NOTE — Plan of Care (Signed)

## 2023-12-31 NOTE — Progress Notes (Addendum)
 PROGRESS NOTE    [facility]  ZOX:[id number] DOB: [birth year] DOA: 12/28/2023 PCP: [name], Susan K, MD  Outpatient Specialists: neurology    Brief Narrative:   Ennifer Tellier Okoro is a 57 y.o. female with medical history significant for chronic pancreatitis/pancreatic pseudocyst, chronic back pain pain on opiates, opioid-induced constipation , seizure disorder, chronic hypoxic respiratory failure on prn O2 at 2 L,,frequent hospitalizations most recently from 3/15 to 11/05/2023 with aspiration pneumonia and hypoxia, discharged on room air with continuation of as needed O2 who is now being admitted with acute respiratory failure with hypercapnia after presenting with confusion.  Patient was unable to be contacted for 2 days (caregiver was not around )and when relative checked in on her, she was found confused and unable to recognize the relative prompting a call to EMS.  Of note, patient was seen by her neurologist on 4/28 (note reviewed) with a diagnosis of intractable epilepsy with breakthrough seizures and was found to have subtherapeutic levels of her Keppra , Lamictal  and Tegretol  which was attributed to noncompliance.     Assessment & Plan:   Principal Problem:   Acute on chronic respiratory failure with hypercapnia (HCC) Active Problems:   Acute metabolic encephalopathy   Rheumatoid arthritis (HCC)   Asthma   Epilepsy, intractable (HCC)   Chronic respiratory failure with hypoxia (HCC)   Chronic pancreatitis (HCC)   Hypothyroidism   COPD (chronic obstructive pulmonary disease) (HCC)  # Acute encephalopathy Etiology unclear. Seizure/post-ictal, hypercarbia, infection (rhinovirus), opioid overdose among possible etiologies (alone or in concert). CT head nothing acute. Improving, today awake and interactive. But remains somnolent. Diet advanced to regular diet today by speech. Today I realized her home morphine  was continued on admission despite presenting as  somnolent and encephalopathic. I've stopped it.  # Diarrhea # Fever # Rhinovirus Diarrhea and fever resolved. Respiratory panel positive for rhinovirus. Nothing aucte on ct f abdomen - supportive care - monitor cultures  # Seizure disorder With med noncompliance - resume home oral meds now that she's taking po (keppra , carbamazepine , lamotrigine )  # Acute on chronic hypoxic respiratory failure # Acute subsegmental PE Hypercarbic on arrival, is on prn o2 at home. CTA showing small subsegmental acute PE. Stable on home oxygen. Carbamazepine  unfortunately reduces effectiveness of all DOACs. Do we need to treat this small incidental PE? Case discussed with Neurology (Lindzen) and oncology Debbi Failing) today. Dr. B advises proceeding with doac, particularly given small size - start apixaban  # Hypothyroid? Don't see she's on home meds. TSH wnl  # Elevated troponins EKG non-ischemic. Mild. No chest pain. Likely demand  # Chronic pain - resumed home duloxetine    - gabapentin  on hold 2/2 sedation - oxy for home morphine   # MDD - hold home mirtazapine  2/2 sedation  # Hypokalemia - replete, f/u Mg  # Hx GCA Finished steroids in 2024  # RA Doesn't appear currently on meds  # Debility PT recs pending   DVT prophylaxis: lovenox  Code Status: full Family Communication: mother at bedside 5/13  Level of care: Progressive Status is: inpatient    Consultants:  neurology  Procedures: none  Antimicrobials:  None thus far    Subjective: Awake but somnolent  Objective: Vitals:   12/30/23 1915 12/30/23 2351 12/31/23 0310 12/31/23 1136  BP: 128/76 128/74 122/75 (!) 143/86  Pulse: 91 70 85 80  Resp: 16 20 20    Temp: 99.1 F (37.3 C) 98.1 F (36.7 C) 97.9 F (36.6 C) 98 F (36.7 C)  TempSrc: Oral  Oral   SpO2: 95% 100% 97% 97%  Weight:      Height:        Intake/Output Summary (Last 24 hours) at 12/31/2023 1301 Last data filed at 12/31/2023 0500 Gross per 24  hour  Intake 1420.87 ml  Output 735 ml  Net 685.87 ml   Filed Weights   12/28/23 2352  Weight: 59.9 kg    Examination:  General exam: disheveled, no acute distress Respiratory system: Clear save for basilar rales Cardiovascular system: S1 & S2 heard, RRR.   Gastrointestinal system: Abdomen is nondistended, soft   Central nervous system: somnolent. Moving all 4 Extremities: trace LE edema Skin: scattered abrasions Psychiatry: calm    Data Reviewed: I have personally reviewed following labs and imaging studies  CBC: Recent Labs  Lab 12/29/23 0020 12/30/23 0409 12/31/23 0544  WBC 8.2 8.5 7.5  NEUTROABS 6.7  --   --   HGB 12.2 9.7* 10.6*  HCT 38.2 30.1* 32.2*  MCV 100.5* 99.3 96.4  PLT 247 211 189   Basic Metabolic Panel: Recent Labs  Lab 12/29/23 0020 12/29/23 0028 12/29/23 1200 12/30/23 0409 12/31/23 0544 12/31/23 1220  NA 139  --  134* 137 134*  --   K 2.8*  --  3.6 3.7 2.9*  --   CL 95*  --  96* 100 97*  --   CO2 33*  --  29 29 29   --   GLUCOSE 124*  --  115* 110* 91  --   BUN 12  --  12 12 7   --   CREATININE 0.50  --  0.41* 0.51 0.34*  --   CALCIUM  8.2*  --  7.5* 7.5* 7.7*  --   MG  --  2.2  --   --   --  1.7   GFR: Estimated Creatinine Clearance: 72.5 mL/min (A) (by C-G formula based on SCr of 0.34 mg/dL (L)). Liver Function Tests: Recent Labs  Lab 12/29/23 0020  AST 29  ALT 13  ALKPHOS 118  BILITOT 0.8  PROT 6.7  ALBUMIN 2.0*   Recent Labs  Lab 12/29/23 0020  LIPASE 51   No results for input(s): "AMMONIA" in the last 168 hours. Coagulation Profile: Recent Labs  Lab 12/29/23 0020  INR 1.0   Cardiac Enzymes: No results for input(s): "CKTOTAL", "CKMB", "CKMBINDEX", "TROPONINI" in the last 168 hours. BNP (last 3 results) No results for input(s): "PROBNP" in the last 8760 hours. HbA1C: No results for input(s): "HGBA1C" in the last 72 hours. CBG: No results for input(s): "GLUCAP" in the last 168 hours. Lipid Profile: No results  for input(s): "CHOL", "HDL", "LDLCALC", "TRIG", "CHOLHDL", "LDLDIRECT" in the last 72 hours. Thyroid  Function Tests: Recent Labs    12/29/23 0130  TSH 1.260   Anemia Panel: No results for input(s): "VITAMINB12", "FOLATE", "FERRITIN", "TIBC", "IRON", "RETICCTPCT" in the last 72 hours. Urine analysis:    Component Value Date/Time   COLORURINE AMBER (A) 12/29/2023 0126   APPEARANCEUR CLEAR (A) 12/29/2023 0126   APPEARANCEUR Clear 05/24/2014 1903   LABSPEC 1.021 12/29/2023 0126   LABSPEC 1.015 05/24/2014 1903   PHURINE 6.0 12/29/2023 0126   GLUCOSEU NEGATIVE 12/29/2023 0126   GLUCOSEU Negative 05/24/2014 1903   HGBUR NEGATIVE 12/29/2023 0126   BILIRUBINUR NEGATIVE 12/29/2023 0126   BILIRUBINUR Negative 05/24/2014 1903   KETONESUR 5 (A) 12/29/2023 0126   PROTEINUR 30 (A) 12/29/2023 0126   NITRITE NEGATIVE 12/29/2023 0126   LEUKOCYTESUR NEGATIVE 12/29/2023 0126  LEUKOCYTESUR Negative 05/24/2014 1903   Sepsis Labs: @LABRCNTIP (procalcitonin:4,lacticidven:4)  ) Recent Results (from the past 240 hours)  Blood culture (routine single)     Status: None (Preliminary result)   Collection Time: 12/29/23 12:20 AM   Specimen: BLOOD  Result Value Ref Range Status   Specimen Description BLOOD BLOOD LEFT ARM  Final   Special Requests   Final    BOTTLES DRAWN AEROBIC AND ANAEROBIC Blood Culture results may not be optimal due to an excessive volume of blood received in culture bottles   Culture   Final    NO GROWTH 2 DAYS Performed at [facility], [address] [place], [place] [address]    Report Status PENDING  Incomplete  C Difficile Quick Screen w PCR reflex     Status: None   Collection Time: 12/29/23  6:23 AM   Specimen: STOOL  Result Value Ref Range Status   C Diff antigen NEGATIVE NEGATIVE Final   C Diff toxin NEGATIVE NEGATIVE Final   C Diff interpretation No C. difficile detected.  Final    Comment: Performed at [facility], [address]  [place], [place] [address]  Gastrointestinal Panel by PCR , Stool     Status: None   Collection Time: 12/29/23  6:23 AM   Specimen: Stool  Result Value Ref Range Status   Campylobacter species NOT DETECTED NOT DETECTED Final   Plesimonas shigelloides NOT DETECTED NOT DETECTED Final   Salmonella species NOT DETECTED NOT DETECTED Final   Yersinia enterocolitica NOT DETECTED NOT DETECTED Final   Vibrio species NOT DETECTED NOT DETECTED Final   Vibrio cholerae NOT DETECTED NOT DETECTED Final   Enteroaggregative E coli (EAEC) NOT DETECTED NOT DETECTED Final   Enteropathogenic E coli (EPEC) NOT DETECTED NOT DETECTED Final   Enterotoxigenic E coli (ETEC) NOT DETECTED NOT DETECTED Final   Shiga like toxin producing E coli (STEC) NOT DETECTED NOT DETECTED Final   Shigella/Enteroinvasive E coli (EIEC) NOT DETECTED NOT DETECTED Final   Cryptosporidium NOT DETECTED NOT DETECTED Final   Cyclospora cayetanensis NOT DETECTED NOT DETECTED Final   Entamoeba histolytica NOT DETECTED NOT DETECTED Final   Giardia lamblia NOT DETECTED NOT DETECTED Final   Adenovirus F40/41 NOT DETECTED NOT DETECTED Final   Astrovirus NOT DETECTED NOT DETECTED Final   Norovirus GI/GII NOT DETECTED NOT DETECTED Final   Rotavirus A NOT DETECTED NOT DETECTED Final   Sapovirus (I, II, IV, and V) NOT DETECTED NOT DETECTED Final    Comment: Performed at [facility], [address] [place], [place] [address]  SARS Coronavirus 2 by RT PCR (hospital order, performed in [facility] hospital lab) *cepheid single result test* Anterior Nasal Swab     Status: None   Collection Time: 12/29/23  9:44 AM   Specimen: Anterior Nasal Swab  Result Value Ref Range Status   SARS Coronavirus 2 by RT PCR NEGATIVE NEGATIVE Final    Comment: (NOTE) SARS-CoV-2 target nucleic acids are NOT DETECTED.  The SARS-CoV-2 RNA is generally detectable in upper and lower respiratory specimens during the acute phase of infection. The  lowest concentration of SARS-CoV-2 viral copies this assay can detect is 250 copies / mL. A negative result does not preclude SARS-CoV-2 infection and should not be used as the sole basis for treatment or other patient management decisions.  A negative result may occur with improper specimen collection / handling, submission of specimen other than nasopharyngeal swab, presence of viral mutation(s) within the areas targeted  by this assay, and inadequate number of viral copies (<250 copies / mL). A negative result must be combined with clinical observations, patient history, and epidemiological information.  Fact Sheet for Patients:   RoadLapTop.co.za  Fact Sheet for Healthcare Providers: http://kim-miller.com/  This test is not yet approved or  cleared by the [place]  FDA and has been authorized for detection and/or diagnosis of SARS-CoV-2 by FDA under an Emergency Use Authorization (EUA).  This EUA will remain in effect (meaning this test can be used) for the duration of the COVID-19 declaration under Section 564(b)(1) of the Act, 21 U.S.C. section 360bbb-3(b)(1), unless the authorization is terminated or revoked sooner.  Performed at [facility], [address] [place], [place] [address]   Respiratory (~20 pathogens) panel by PCR     Status: Abnormal   Collection Time: 12/29/23  9:44 AM   Specimen: Nasopharyngeal Swab; Respiratory  Result Value Ref Range Status   Adenovirus NOT DETECTED NOT DETECTED Final   Coronavirus 229E NOT DETECTED NOT DETECTED Final    Comment: (NOTE) The Coronavirus on the Respiratory Panel, DOES NOT test for the novel  Coronavirus (2019 nCoV)    Coronavirus HKU1 NOT DETECTED NOT DETECTED Final   Coronavirus NL63 NOT DETECTED NOT DETECTED Final   Coronavirus OC43 NOT DETECTED NOT DETECTED Final   Metapneumovirus NOT DETECTED NOT DETECTED Final   Rhinovirus / Enterovirus DETECTED (A) NOT  DETECTED Final   Influenza A NOT DETECTED NOT DETECTED Final   Influenza B NOT DETECTED NOT DETECTED Final   Parainfluenza Virus 1 NOT DETECTED NOT DETECTED Final   Parainfluenza Virus 2 NOT DETECTED NOT DETECTED Final   Parainfluenza Virus 3 NOT DETECTED NOT DETECTED Final   Parainfluenza Virus 4 NOT DETECTED NOT DETECTED Final   Respiratory Syncytial Virus NOT DETECTED NOT DETECTED Final   Bordetella pertussis NOT DETECTED NOT DETECTED Final   Bordetella Parapertussis NOT DETECTED NOT DETECTED Final   Chlamydophila pneumoniae NOT DETECTED NOT DETECTED Final   Mycoplasma pneumoniae NOT DETECTED NOT DETECTED Final    Comment: Performed at [facility], [address], [place], [place] [address]  Culture, blood (single) w Reflex to ID Panel     Status: None (Preliminary result)   Collection Time: 12/31/23  5:44 AM   Specimen: BLOOD LEFT ARM  Result Value Ref Range Status   Specimen Description BLOOD LEFT ARM  Final   Special Requests   Final    BOTTLES DRAWN AEROBIC AND ANAEROBIC Blood Culture results may not be optimal due to an inadequate volume of blood received in culture bottles   Culture   Final    NO GROWTH < 12 HOURS Performed at [facility], [address] [place], [place] [address]    Report Status PENDING  Incomplete         Radiology Studies: US  Venous Img Lower Bilateral (DVT) Result Date: 12/31/2023 CLINICAL DATA:  Small volume pulmonary embolism in the right lower lobe. EXAM: BILATERAL LOWER EXTREMITY VENOUS DOPPLER ULTRASOUND TECHNIQUE: Gray-scale sonography with graded compression, as well as color Doppler and duplex ultrasound were performed to evaluate the lower extremity deep venous systems from the level of the common femoral vein and including the common femoral, femoral, profunda femoral, popliteal and calf veins including the posterior tibial, peroneal and gastrocnemius veins when visible. The superficial great saphenous vein was also  interrogated. Spectral Doppler was utilized to evaluate flow at rest and with distal augmentation maneuvers in the common femoral, femoral and  popliteal veins. COMPARISON:  Prior left lower extremity study on 05/17/2014 FINDINGS: RIGHT LOWER EXTREMITY Common Femoral Vein: No evidence of thrombus. Normal compressibility, respiratory phasicity and response to augmentation. Saphenofemoral Junction: No evidence of thrombus. Normal compressibility and flow on color Doppler imaging. Profunda Femoral Vein: No evidence of thrombus. Normal compressibility and flow on color Doppler imaging. Femoral Vein: No evidence of thrombus. Normal compressibility, respiratory phasicity and response to augmentation. Popliteal Vein: No evidence of thrombus. Normal compressibility, respiratory phasicity and response to augmentation. Calf Veins: Limited visualization but no evidence of thrombus. Normal compressibility and flow on color Doppler imaging. Superficial Great Saphenous Vein: No evidence of thrombus. Normal compressibility. Venous Reflux:  None. Other Findings: No evidence of superficial thrombophlebitis or abnormal fluid collection. LEFT LOWER EXTREMITY Common Femoral Vein: No evidence of thrombus. Normal compressibility, respiratory phasicity and response to augmentation. Saphenofemoral Junction: No evidence of thrombus. Normal compressibility and flow on color Doppler imaging. Profunda Femoral Vein: No evidence of thrombus. Normal compressibility and flow on color Doppler imaging. Femoral Vein: No evidence of thrombus. Normal compressibility, respiratory phasicity and response to augmentation. Popliteal Vein: No evidence of thrombus. Normal compressibility, respiratory phasicity and response to augmentation. Calf Veins: Limited visualization but no evidence of thrombus. Normal compressibility and flow on color Doppler imaging. Superficial Great Saphenous Vein: No evidence of thrombus. Normal compressibility. Venous Reflux:   None. Other Findings: No evidence of superficial thrombophlebitis or abnormal fluid collection. IMPRESSION: No evidence of deep venous thrombosis in either lower extremity. Electronically Signed   By: Erica Hau M.D.   On: 12/31/2023 09:03        Scheduled Meds:  carbamazepine   100 mg Oral BID   DULoxetine   60 mg Oral BID   lamoTRIgine   50 mg Oral BID   levETIRAcetam   1,500 mg Oral BID   [START ON 01/01/2024] pantoprazole   40 mg Oral Daily   Continuous Infusions:  dextrose  5 % and 0.45 % NaCl 100 mL/hr at 12/31/23 0310   heparin  1,500 Units/hr (12/31/23 0310)     LOS: 2 days   CRITICAL CARE Performed by: Josem Nick, MD [facility]   If 7PM-7AM, please contact night-coverage [email or web address] Password [email or web address] 12/31/2023, 1:01 PM

## 2023-12-31 NOTE — Progress Notes (Signed)
 SLP Cancellation Note  Patient Details Name: Abigail [name] [facility] MRN: [id number] DOB: 1966-07-14   Cancelled treatment:       Reason Eval/Treat Not Completed: Patient at procedure or test/unavailable  Pt off the floor for procedure. Will re-attempt as schedule allows.    Vanette Noguchi 12/31/2023, 8:05 AM

## 2023-12-31 NOTE — Telephone Encounter (Signed)

## 2023-12-31 NOTE — Progress Notes (Signed)
 PHARMACIST - PHYSICIAN COMMUNICATION  DR:   Sari Cunning  CONCERNING: IV to Oral Route Change Policy  RECOMMENDATION: This patient is receiving pantoprazole  by the intravenous route.  Based on criteria approved by the Pharmacy and Therapeutics Committee, the intravenous medication(s) is/are being converted to the equivalent oral dose form(s).   DESCRIPTION: These criteria include: The patient is eating (either orally or via tube) and/or has been taking other orally administered medications for a least 24 hours The patient has no evidence of active gastrointestinal bleeding or impaired GI absorption (gastrectomy, short bowel, patient on TNA or NPO).   Abigail [name] PharmD, BCPS 12/31/2023 11:05 AM

## 2023-12-31 NOTE — Progress Notes (Signed)
 PHARMACY - ANTICOAGULATION CONSULT NOTE  Pharmacy Consult for Apixaban Indication: pulmonary embolus  Patient Measurements: Height: 5\' 8"  (172.7 cm) Weight: 59.9 kg (132 lb 0.9 oz) IBW/kg (Calculated) : 63.9 HEPARIN  DW (KG): 59.9  Labs: Recent Labs    12/29/23 0020 12/29/23 1200 12/29/23 1842 12/30/23 0409 12/30/23 0935 12/30/23 2120 12/31/23 0544 12/31/23 1220  HGB 12.2  --   --  9.7*  --   --  10.6*  --   HCT 38.2  --   --  30.1*  --   --  32.2*  --   PLT 247  --   --  211  --   --  189  --   LABPROT 13.4  --   --   --   --   --   --   --   INR 1.0  --   --   --   --   --   --   --   HEPARINUNFRC  --   --    < >  --    < > <0.10* 0.46 0.39  CREATININE 0.50 0.41*  --  0.51  --   --  0.34*  --   TROPONINIHS 49* 37*  --   --   --   --   --   --    < > = values in this interval not displayed.    Estimated Creatinine Clearance: 72.5 mL/min (A) (by C-G formula based on SCr of 0.34 mg/dL (L)).   Medical History: Past Medical History:  Diagnosis Date   Anxiety    Arthritis    Back pain    Basal cell carcinoma 10/04/2021   R axilla - needs excised 11/28/21   Basal cell carcinoma 10/04/2021   L antecubital excised 11/14/21   Diarrhea 11/12/2016   Fibromyalgia    Generalized abdominal pain 11/12/2016   H. pylori infection    Hyperlipidemia    IBS (irritable bowel syndrome)    Infectious colitis 04/29/2016   Migraines    Moderate dehydration 04/29/2016   Muscle pain    Opioid overdose (HCC) 12/07/2022   Reflux    Seizures (HCC)    Unexplained weight loss 11/12/2016   Assessment: 58 yo female admitted with acute on chronic respiratory failure and acute encephalopathy. CTA positive for single subsegmental pulmonary embolism in the right lower lobe. Pharmacy has been consulted for apixaban dosing.  Provider aware of DDI with Tegretol  and multi-disciplinary discussion has been had with neurology and hematology and decision has been made to proceed with apixaban.     Plan:  --Apixaban 10 mg BID x 7 days followed by 5 mg BID for remaining duration of therapy --Continue to monitor for s/sx of new or worsening VTE given DDI with Tegretol  --CBC per protocol while inpatient  Page Boast 12/31/2023 5:56 PM

## 2023-12-31 NOTE — Progress Notes (Signed)
 PHARMACY - ANTICOAGULATION CONSULT NOTE  Pharmacy Consult for heparin  infusion  Indication: pulmonary embolus  Allergies  Allergen Reactions   Nsaids Hives   Tapentadol Swelling, Rash and Other (See Comments)    Nucynta- Made her deathly sick   Cephalexin  Other (See Comments)    Reaction not cited   Codeine Other (See Comments)    Reaction not cited   Darvocet [Propoxyphene N-Acetaminophen ] Other (See Comments)    Reaction not cited   Latex Other (See Comments)    Reaction not cited   Silicone Other (See Comments)    Reaction not cited   Sulfa Antibiotics Hives   Tape Other (See Comments)    Reaction not cited   Meloxicam Rash    Patient Measurements: Height: 5\' 8"  (172.7 cm) Weight: 59.9 kg (132 lb 0.9 oz) IBW/kg (Calculated) : 63.9 HEPARIN  DW (KG): 59.9  Vital Signs: Temp: 98 F (36.7 C) (05/13 1136) Temp Source: Oral (05/13 0310) BP: 143/86 (05/13 1136) Pulse Rate: 80 (05/13 1136)  Labs: Recent Labs    12/29/23 0020 12/29/23 1200 12/29/23 1842 12/30/23 0409 12/30/23 0935 12/30/23 2120 12/31/23 0544 12/31/23 1220  HGB 12.2  --   --  9.7*  --   --  10.6*  --   HCT 38.2  --   --  30.1*  --   --  32.2*  --   PLT 247  --   --  211  --   --  189  --   LABPROT 13.4  --   --   --   --   --   --   --   INR 1.0  --   --   --   --   --   --   --   HEPARINUNFRC  --   --    < >  --    < > <0.10* 0.46 0.39  CREATININE 0.50 0.41*  --  0.51  --   --  0.34*  --   TROPONINIHS 49* 37*  --   --   --   --   --   --    < > = values in this interval not displayed.    Estimated Creatinine Clearance: 72.5 mL/min (A) (by C-G formula based on SCr of 0.34 mg/dL (L)).   Medical History: Past Medical History:  Diagnosis Date   Anxiety    Arthritis    Back pain    Basal cell carcinoma 10/04/2021   R axilla - needs excised 11/28/21   Basal cell carcinoma 10/04/2021   L antecubital excised 11/14/21   Diarrhea 11/12/2016   Fibromyalgia    Generalized abdominal pain  11/12/2016   H. pylori infection    Hyperlipidemia    IBS (irritable bowel syndrome)    Infectious colitis 04/29/2016   Migraines    Moderate dehydration 04/29/2016   Muscle pain    Opioid overdose (HCC) 12/07/2022   Reflux    Seizures (HCC)    Unexplained weight loss 11/12/2016    Medications:  Scheduled:   carbamazepine   100 mg Oral BID   DULoxetine   60 mg Oral BID   lamoTRIgine   50 mg Oral BID   levETIRAcetam   1,500 mg Oral BID   [START ON 01/01/2024] pantoprazole   40 mg Oral Daily    Assessment: 58 yo female admitted with acute on chronic respiratory failure and acute encephalopathy.  CT Angio Chest positive for single subsegmental pulmonary embolism in the right lower lobe. Pharmacy has  been consulted for heparin  dosing and monitoring. Patient did received enoxaparin  40mg  x 1 dose this morning @ 0930.   Goal of Therapy:  Heparin  level 0.3-0.7 units/ml Monitor platelets by anticoagulation protocol: Yes  0511 @ 1842: HL 0.52 = Therapeutic x 1 0512 0037 HL 0.18, subtherapeutic 0512 0935 HL 0.17 0512 2120 HL < 0.1 0513 0544 HL 0.46, therapeutic  x 1 0513 1220 HL 0.39, therapeutic x 2   Plan:  Continue heparin  infusion at 1500 units/hr Recheck HL and CBC with AM labs MD waiting for hematology input to transition heparin  to oral therapy (if needed). Carbamazepine  and DOAC - major interaction and not recommended for PE treatment. Alternatives warfarin and enoxaparin  - issues with patient treatment compliance also a limiting factor.  Amonie Wisser Rodriguez-Guzman PharmD, BCPS 12/31/2023 2:19 PM

## 2023-12-31 NOTE — Progress Notes (Signed)
 Notified Abigail Arthurs, NP that pt has had multiple loose BMs in a row and is requesting imodium because she is incontinent and her gluteal folds are raw. BMs are getting more watery. Pt tested negative for C-diff.   Imodium not ordered due to unknown source of diarrhea. Flexiseal ordered.

## 2023-12-31 NOTE — TOC Initial Note (Signed)
 Transition of Care [facility]) - Initial/Assessment Note    Patient Details  Name: Abigail [name] [facility] MRN: [id number] Date of Birth: 03/16/1966  Transition of Care [facility] CM/SW Contact:    Odilia Bennett, LCSW Phone Number: 12/31/2023, 3:36 PM  Clinical Narrative: CSW met with patient. No family at bedside. CSW introduced role and explained that PT recommendations would be discussed. Patient was lethargic but was able to have a brief conversation. PT is currently recommending SNF. Patient stated she is not really interested in that. CSW explained that PT is planning on coming back tomorrow and is hoping she'll be able to improve. Patient is agreeable to plan. Per Patient Abigail [name], she is active with [facility]. Liaison confirmed she is receiving PT.                  Expected Discharge Plan:  (TBD) Barriers to Discharge: Continued Medical Work up   Patient Goals and CMS Choice            Expected Discharge Plan and Services     Post Acute Care Choice:  (TBD) Living arrangements for the past 2 months: Single Family Home                           HH Arranged: PT HH Agency: [facility] Date HH Agency Contacted: 12/31/23   Representative spoke with at [facility] Agency: Verdis Glade  Prior Living Arrangements/Services Living arrangements for the past 2 months: Single Family Home Lives with:: Adult Children Patient language and need for interpreter reviewed:: Yes Do you feel safe going back to the place where you live?: Yes      Need for Family Participation in Patient Care: Yes (Comment) Care giver support system in place?: Yes (comment) Current home services: Home PT, DME Criminal Activity/Legal Involvement Pertinent to Current Situation/Hospitalization: No - Comment as needed  Activities of Daily Living   ADL Screening (condition at time of admission) Independently performs ADLs?: No Does the patient have a NEW difficulty with bathing/dressing/toileting/self-feeding  that is expected to last >3 days?: Yes (Initiates electronic notice to provider for possible OT consult) Does the patient have a NEW difficulty with getting in/out of bed, walking, or climbing stairs that is expected to last >3 days?: Yes (Initiates electronic notice to provider for possible PT consult) Does the patient have a NEW difficulty with communication that is expected to last >3 days?: Yes (Initiates electronic notice to provider for possible SLP consult) Is the patient deaf or have difficulty hearing?: No Does the patient have difficulty seeing, even when wearing glasses/contacts?: No Does the patient have difficulty concentrating, remembering, or making decisions?: No  Permission Sought/Granted                  Emotional Assessment Appearance:: Appears stated age Attitude/Demeanor/Rapport: Engaged, Gracious, Lethargic Affect (typically observed): Pleasant Orientation: : Oriented to Self, Oriented to Place, Oriented to  Time, Oriented to Situation Alcohol / Substance Use: Not Applicable Psych Involvement: No (comment)  Admission diagnosis:  Failure to thrive in adult [R62.7] Polysubstance abuse (HCC) [F19.10] Acute on chronic respiratory failure with hypercapnia (HCC) [J96.22] Acute respiratory failure with hypoxia and hypercapnia (HCC) [J96.01, J96.02] Altered mental status, unspecified altered mental status type [R41.82] Patient Active Problem List   Diagnosis Date Noted   Acute on chronic respiratory failure with hypercapnia (HCC) 12/29/2023   COPD (chronic obstructive pulmonary disease) (HCC) 12/29/2023   Peripheral edema 12/24/2023  Hypothyroidism 11/02/2023   Hyponatremia 09/27/2023   Chronic pancreatitis (HCC) 07/30/2023   Chronic respiratory failure with hypoxia (HCC) 07/29/2023   Pancreatic pseudocyst 07/13/2023   Epilepsy, intractable (HCC) 06/28/2023   Osteopenia 05/29/2023   Protein-calorie malnutrition, severe 12/21/2022   Giant cell arteritis (HCC)  12/19/2022   DNR (do not resuscitate) 12/19/2022   Hypokalemia 12/08/2022   Acute metabolic encephalopathy 12/07/2022   Idiopathic acute pancreatitis without infection or necrosis 08/08/2022   Tobacco use disorder 08/08/2022   Asthma 04/26/2020   Drug-induced constipation 04/26/2020   GERD (gastroesophageal reflux disease) 11/12/2016   Chronic prescription opiate use - thru [facility] in [place], [place] 04/29/2016   Long term current use of immunosuppressive drug 04/29/2016   Depression with anxiety 02/16/2013   Anxiety 02/13/2013   DDD (degenerative disc disease), cervical 02/13/2013   Rheumatoid arthritis (HCC) 02/13/2013   PCP:  [name], Susan K, MD Pharmacy:   [facility] [phone] - [place], [place] - [address] [place] [address] Phone: [phone] Fax: [phone]  [facility] [address] [place] [address] Phone: [phone] Fax: [phone]  [facility] [address] [place] [address] Phone: [phone] Fax: [phone]     Social Drivers of Health (SDOH) Social History: SDOH Screenings   Food Insecurity: No Food Insecurity (12/29/2023)  Recent Concern: Food Insecurity - Food Insecurity Present (10/23/2023)  Housing: Low Risk  (12/29/2023)  Recent Concern: Housing - High Risk (10/23/2023)  Transportation Needs: No Transportation Needs (12/29/2023)  Recent Concern: Transportation Needs - Unmet Transportation Needs (10/23/2023)  Utilities: Not At Risk (12/29/2023)  Depression (PHQ2-9): High Risk (12/24/2023)  Social Connections: Unknown (12/29/2023)  Recent Concern: Social Connections - Socially Isolated (10/09/2023)  Tobacco Use: High Risk (12/28/2023)   SDOH Interventions:     Readmission Risk Interventions    12/31/2023    3:36 PM 09/26/2023    4:06 PM 07/31/2023    1:07 PM  Readmission Risk Prevention Plan  Transportation Screening  Complete Complete  Medication  Review Oceanographer)  Complete Complete  PCP or Specialist appointment within 3-5 days of discharge Complete  Complete  HRI or Home Care Consult Complete  Complete  SW Recovery Care/Counseling Consult Complete Complete Complete  Palliative Care Screening Not Applicable Not Applicable Not Applicable  Skilled Nursing Facility Not Complete Not Applicable Not Applicable  SNF Comments Will follow up tomorrow after PT works with her again.

## 2023-12-31 NOTE — Progress Notes (Signed)
 Physical Therapy Treatment Patient Details Name: Abigail [name] [facility] MRN: [id number] DOB: 13-Sep-1965 Today's Date: 12/31/2023   History of Present Illness Pt is a 58 y/o F admitted on 12/28/23 after presenting with confusion. Pt is being treated for acute on chronic respiratory failure with hypercapnia, acute metabolic encephalopathy. Pt also found to have acute PE. PMH: chronic pancreatitis/pancreatic pseudocyst, chronic back pain, opioid induced constipation, seizures, chronic hypoxic respiratory failure on PRN O2, fibromyalgia, basal cell carcinoma    PT Comments  Patient remains grossly lethargic throughout session, requiring max cuing for alertness and attempts at active participation with functional tasks. Constant cuing, hand-over-hand assist, required for purposeful, isolated movement of individual extremities. Max/total assist for bed mobility and repositioning during session. Maintains eyes closed; limited purposeful interaction with therapist and care team despite max cuing/stimulation. Unsafe to attempt mobility progression this date as result.  Discussed with attending; adjusting medications in attempt to improve/optimize alertness and participation.  Will continue to follow and progress mobility as appropriate.  Also of note, per attending, mother at bedside reports patient ambulatory at baseline, able to get to/from MD appointments as needed.     If plan is discharge home, recommend the following: A lot of help with walking and/or transfers;A lot of help with bathing/dressing/bathroom;Assist for transportation;Help with stairs or ramp for entrance;Assistance with cooking/housework   Can travel by Doctor, hospital (measurements PT);Hospital bed;Wheelchair cushion (measurements PT)    Recommendations for Other Services       Precautions / Restrictions Precautions Precautions: Fall Restrictions Weight Bearing Restrictions Per  Provider Order: No     Mobility  Bed Mobility Overal bed mobility: Needs Assistance Bed Mobility: Rolling Rolling: Max assist         General bed mobility comments: hand-over-hand assist to initiate isolated, purposeful movement of extremities; moaning, grimacing in pain with any/all movement    Transfers                   General transfer comment: unable to tolerate    Ambulation/Gait               General Gait Details: unable to tolerate   Stairs             Wheelchair Mobility     Tilt Bed    Modified Rankin (Stroke Patients Only)       Balance                                            Communication Communication Communication: No apparent difficulties  Cognition Arousal: Lethargic Behavior During Therapy: Flat affect                           PT - Cognition Comments: Minimally interactive with care team beyond voicing discomfort and initial statement of "I threw up".  Max cuing to initiate purposeful, isolated movement of extremities; max cuing for command following (often requiring hand-over-hand to facilitate).  Does not open eyes throughout session despite max cuing. Following commands: Impaired Following commands impaired: Follows one step commands inconsistently    Cueing    Exercises Other Exercises Other Exercises: Rolling bilat, max assist +1; hand-over-hand assist for position and purposeful use of extremities.  Dep assist for peri-care, linen and gown change Other Exercises: Attempted  to engage patient with light grooming (washcloth to face).  Patient maintains washcloth on hand and hand to face (once assisted by therapist), but does not follow with any active participation with task beyond initial placement. Other Exercises: Positioned to neutral in semi-chair position in bed (to promote more upright positioning), incorporated cervical rotation bilat to prevent ROM loss.  Does require some degree  of recline in bed to overall head/neck positioning (otherwise, does not initiate postural extension to maintain head/neck in neutral, upright position)    General Comments        Pertinent Vitals/Pain Pain Assessment Pain Assessment: Faces Faces Pain Scale: Hurts even more Pain Location: "all over" Pain Descriptors / Indicators: Grimacing, Discomfort, Guarding, Moaning Pain Intervention(s): Limited activity within patient's tolerance, Monitored during session, Repositioned    Home Living                          Prior Function            PT Goals (current goals can now be found in the care plan section) Acute Rehab PT Goals Patient Stated Goal: none stated PT Goal Formulation: Patient unable to participate in goal setting Time For Goal Achievement: 01/13/24 Potential to Achieve Goals: Fair Progress towards PT goals: Not progressing toward goals - comment    Frequency    Min 2X/week      PT Plan      Co-evaluation              AM-PAC PT "6 Clicks" Mobility   Outcome Measure  Help needed turning from your back to your side while in a flat bed without using bedrails?: A Lot Help needed moving from lying on your back to sitting on the side of a flat bed without using bedrails?: A Lot Help needed moving to and from a bed to a chair (including a wheelchair)?: Total Help needed standing up from a chair using your arms (e.g., wheelchair or bedside chair)?: Total Help needed to walk in hospital room?: Total Help needed climbing 3-5 steps with a railing? : Total 6 Click Score: 8    End of Session   Activity Tolerance: Patient limited by lethargy Patient left: in bed;with bed alarm set;with call bell/phone within reach   PT Visit Diagnosis: Other abnormalities of gait and mobility (R26.89);Pain     Time: 1610-9604 PT Time Calculation (min) (ACUTE ONLY): 24 min  Charges:    $Therapeutic Activity: 23-37 mins PT General Charges $$ ACUTE PT VISIT:  1 Visit                    Ryen Rhames H. Bevin Bucks, PT, DPT, NCS 12/31/23, 1:42 PM [phone]

## 2023-12-31 NOTE — Progress Notes (Signed)
 Speech Language Pathology Treatment: Dysphagia  Patient Details Name: Abigail [name] [facility] MRN: [id number] DOB: Oct 23, 1965 Today's Date: 12/31/2023 Time: 4098-1191 SLP Time Calculation (min) (ACUTE ONLY): 15 min  Assessment / Plan / Recommendation Clinical Impression  Pt seen for ongoing trials of PO in hopes of placing pt on diet. Pt required continuous moderate to maximal encouragement to participate in session and for consumption of thin liquids via straw and graham crackers. Pt was dependent on nursing and this Clinical research associate for feeding. Despite poor effort, pt consumed thin liquids via straw and graham crackers with no overt s/s of dysphagia or aspiration. Her aspiration risk will likely fluctuate as will her ability to increase consumption of POs d/t continue encephalopathy. AT this time, pt appears at reduced risk of aspiration when following general aspiration precautions while consuming regular diet with thin liquids via straw and medicine whole with thin liquids. Skilled ST services are not indicated at this time.    HPI HPI: Abigail [name] is a 58 y.o. female with medical history significant for chronic pancreatitis/pancreatic pseudocyst, chronic back pain pain on opiates, opioid-induced constipation , seizure disorder, chronic hypoxic respiratory failure on prn O2 at 2 L,,frequent hospitalizations most recently from 3/15 to 11/05/2023 with aspiration pneumonia and hypoxia, discharged on room air with continuation of as needed O2 who is now being admitted with acute respiratory failure with hypercapnia after presenting with confusion.  Patient was unable to be contacted for 2 days (caregiver was not around )and when relative checked in on her, she was found confused and unable to recognize the relative prompting a call to EMS.  Of note, patient was seen by her neurologist on 4/28 (note reviewed) with a diagnosis of intractable epilepsy with breakthrough seizures and was found to have  subtherapeutic levels of her Keppra , Lamictal  and Tegretol  which was attributed to noncompliance.  Chest x- ray revealed low lung volumes with mild bibasilar linear scarring and/or atelectasis. CT Abdomen was  Positive for single subsegmental pulmonary embolism in the right lower lobe. Atelectasis and small pleural effusions.  Numerous chronic compression fractures, T10 and T12 fractures are unhealed. Abdomen: Generalized colitis. Chronic pancreatitis with numerous pseudo cysts. Dominant cyst at the pancreatic tail and dissecting around the upper spleen has increased in size to 6.7 cm, but no signs of super infection. Mass effect on the stomach is unchanged. Atherosclerosis, liver steatosis. Chronic gastric varices.      SLP Plan  All goals met      Recommendations for follow up therapy are one component of a multi-disciplinary discharge planning process, led by the attending physician.  Recommendations may be updated based on patient status, additional functional criteria and insurance authorization.    Recommendations  Diet recommendations: Regular;Thin liquid Liquids provided via: Cup;Straw Medication Administration: Whole meds with liquid Supervision: Staff to assist with self feeding;Full supervision/cueing for compensatory strategies Compensations: Minimize environmental distractions;Slow rate;Small sips/bites Postural Changes and/or Swallow Maneuvers: Seated upright 90 degrees                  Oral care BID     Dysphagia, unspecified (R13.10)     All goals met    Abigail [name] B. Garlin Junker, M.S., CCC-SLP, Tree surgeon Certified Brain Injury Specialist [facility]  [facility] Rehabilitation Services Office [phone] Ascom [phone] Fax [phone]

## 2023-12-31 NOTE — Progress Notes (Signed)
 PHARMACY - ANTICOAGULATION CONSULT NOTE  Pharmacy Consult for heparin  infusion  Indication: pulmonary embolus  Allergies  Allergen Reactions   Nsaids Hives   Tapentadol Swelling, Rash and Other (See Comments)    Nucynta- Made her deathly sick   Cephalexin  Other (See Comments)    Reaction not cited   Codeine Other (See Comments)    Reaction not cited   Darvocet [Propoxyphene N-Acetaminophen ] Other (See Comments)    Reaction not cited   Latex Other (See Comments)    Reaction not cited   Silicone Other (See Comments)    Reaction not cited   Sulfa Antibiotics Hives   Tape Other (See Comments)    Reaction not cited   Meloxicam Rash    Patient Measurements: Height: 5\' 8"  (172.7 cm) Weight: 59.9 kg (132 lb 0.9 oz) IBW/kg (Calculated) : 63.9 HEPARIN  DW (KG): 59.9  Vital Signs: Temp: 97.9 F (36.6 C) (05/13 0310) Temp Source: Oral (05/13 0310) BP: 122/75 (05/13 0310) Pulse Rate: 85 (05/13 0310)  Labs: Recent Labs    12/29/23 0020 12/29/23 1200 12/29/23 1842 12/30/23 0409 12/30/23 0935 12/30/23 2120 12/31/23 0544  HGB 12.2  --   --  9.7*  --   --  10.6*  HCT 38.2  --   --  30.1*  --   --  32.2*  PLT 247  --   --  211  --   --  189  LABPROT 13.4  --   --   --   --   --   --   INR 1.0  --   --   --   --   --   --   HEPARINUNFRC  --   --    < >  --  0.17* <0.10* 0.46  CREATININE 0.50 0.41*  --  0.51  --   --  0.34*  TROPONINIHS 49* 37*  --   --   --   --   --    < > = values in this interval not displayed.    Estimated Creatinine Clearance: 72.5 mL/min (A) (by C-G formula based on SCr of 0.34 mg/dL (L)).   Medical History: Past Medical History:  Diagnosis Date   Anxiety    Arthritis    Back pain    Basal cell carcinoma 10/04/2021   R axilla - needs excised 11/28/21   Basal cell carcinoma 10/04/2021   L antecubital excised 11/14/21   Diarrhea 11/12/2016   Fibromyalgia    Generalized abdominal pain 11/12/2016   H. pylori infection    Hyperlipidemia     IBS (irritable bowel syndrome)    Infectious colitis 04/29/2016   Migraines    Moderate dehydration 04/29/2016   Muscle pain    Opioid overdose (HCC) 12/07/2022   Reflux    Seizures (HCC)    Unexplained weight loss 11/12/2016    Medications:  Scheduled:   carbamazepine   100 mg Oral BID   DULoxetine   60 mg Oral BID   lamoTRIgine   50 mg Oral BID   levETIRAcetam   1,500 mg Oral BID   mouth rinse  15 mL Mouth Rinse Q2H   pantoprazole  (PROTONIX ) IV  40 mg Intravenous Q24H    Assessment: 58 yo female admitted with acute on chronic respiratory failure and acute encephalopathy.  CT Angio Chest positive for single subsegmental pulmonary embolism in the right lower lobe. Pharmacy has been consulted for heparin  dosing and monitoring. Patient did received enoxaparin  40mg  x 1 dose this morning @  0930.   Goal of Therapy:  Heparin  level 0.3-0.7 units/ml Monitor platelets by anticoagulation protocol: Yes  0511 @ 1842: HL 0.52 = Therapeutic x 1 0512 0037 HL 0.18, subtherapeutic 0512 0935 HL 0.17 0512 2120 HL < 0.1 0513 0544 HL 0.46, therapeutic  x 1   Plan:  Continue heparin  infusion at 1500 units/hr Recheck anti-Xa level in 6 hrs to confirm Continue to monitor H&H and platelets  Coretta Dexter, PharmD, [facility] 12/31/2023 7:05 AM

## 2024-01-01 DIAGNOSIS — J9622 Acute and chronic respiratory failure with hypercapnia: Secondary | ICD-10-CM | POA: Diagnosis not present

## 2024-01-01 LAB — CBC
HCT: 29.6 % — ABNORMAL LOW (ref 36.0–46.0)
Hemoglobin: 9.9 g/dL — ABNORMAL LOW (ref 12.0–15.0)
MCH: 31.4 pg (ref 26.0–34.0)
MCHC: 33.4 g/dL (ref 30.0–36.0)
MCV: 94 fL (ref 80.0–100.0)
Platelets: 173 10*3/uL (ref 150–400)
RBC: 3.15 MIL/uL — ABNORMAL LOW (ref 3.87–5.11)
RDW: 13.3 % (ref 11.5–15.5)
WBC: 7.8 10*3/uL (ref 4.0–10.5)
nRBC: 0 % (ref 0.0–0.2)

## 2024-01-01 LAB — BASIC METABOLIC PANEL WITH GFR
Anion gap: 7 (ref 5–15)
Anion gap: 9 (ref 5–15)
Anion gap: 9 (ref 5–15)
BUN: <5 mg/dL — ABNORMAL LOW (ref 6–20)
BUN: <5 mg/dL — ABNORMAL LOW (ref 6–20)
BUN: 5 mg/dL — ABNORMAL LOW (ref 6–20)
CO2: 26 mmol/L (ref 22–32)
CO2: 27 mmol/L (ref 22–32)
CO2: 27 mmol/L (ref 22–32)
Calcium: 7 mg/dL — ABNORMAL LOW (ref 8.9–10.3)
Calcium: 7.4 mg/dL — ABNORMAL LOW (ref 8.9–10.3)
Calcium: 7.7 mg/dL — ABNORMAL LOW (ref 8.9–10.3)
Chloride: 98 mmol/L (ref 98–111)
Chloride: 97 mmol/L — ABNORMAL LOW (ref 98–111)
Chloride: 97 mmol/L — ABNORMAL LOW (ref 98–111)
Creatinine, Ser: 0.35 mg/dL — ABNORMAL LOW (ref 0.44–1.00)
Creatinine, Ser: 0.41 mg/dL — ABNORMAL LOW (ref 0.44–1.00)
Creatinine, Ser: 0.35 mg/dL — ABNORMAL LOW (ref 0.44–1.00)
GFR, Estimated: >60 mL/min (ref >60)
GFR, Estimated: >60 mL/min (ref >60)
GFR, Estimated: >60 mL/min (ref >60)
Glucose, Bld: 78 mg/dL (ref 70–99)
Glucose, Bld: 98 mg/dL (ref 70–99)
Glucose, Bld: 93 mg/dL (ref 70–99)
Potassium: 2.8 mmol/L — ABNORMAL LOW (ref 3.5–5.1)
Potassium: 2.7 mmol/L — CL (ref 3.5–5.1)
Potassium: 3.5 mmol/L (ref 3.5–5.1)
Sodium: 131 mmol/L — ABNORMAL LOW (ref 135–145)
Sodium: 133 mmol/L — ABNORMAL LOW (ref 135–145)
Sodium: 133 mmol/L — ABNORMAL LOW (ref 135–145)

## 2024-01-01 LAB — MAGNESIUM: Magnesium: 2.2 mg/dL (ref 1.7–2.4)

## 2024-01-01 MED ORDER — POTASSIUM CHLORIDE CRYS ER 20 MEQ PO TBCR
80.0000 meq | EXTENDED_RELEASE_TABLET | Freq: Once | ORAL | Status: AC
  Administered 2024-01-01: 80 meq via ORAL
  Filled 2024-01-01: qty 4

## 2024-01-01 MED ORDER — POTASSIUM CHLORIDE 20 MEQ PO PACK
80.0000 meq | PACK | Freq: Once | ORAL | Status: DC
  Filled 2024-01-01: qty 4

## 2024-01-01 MED ORDER — POTASSIUM CHLORIDE CRYS ER 20 MEQ PO TBCR
40.0000 meq | EXTENDED_RELEASE_TABLET | Freq: Once | ORAL | Status: AC
  Administered 2024-01-01: 40 meq via ORAL
  Filled 2024-01-01: qty 2

## 2024-01-01 NOTE — Plan of Care (Signed)
  Problem: Education: Goal: Knowledge of General Education information will improve Description: Including pain rating scale, medication(s)/side effects and non-pharmacologic comfort measures Outcome: Progressing   Problem: Clinical Measurements: Goal: Ability to maintain clinical measurements within normal limits will improve Outcome: Progressing Goal: Will remain free from infection Outcome: Progressing Goal: Diagnostic test results will improve Outcome: Progressing Goal: Respiratory complications will improve Outcome: Progressing Goal: Cardiovascular complication will be avoided Outcome: Progressing   Problem: Nutrition: Goal: Adequate nutrition will be maintained Outcome: Progressing   Problem: Elimination: Goal: Will not experience complications related to bowel motility Outcome: Progressing Goal: Will not experience complications related to urinary retention Outcome: Progressing   Problem: Skin Integrity: Goal: Risk for impaired skin integrity will decrease Outcome: Progressing

## 2024-01-01 NOTE — Care Management Important Message (Signed)
 Important Message  Patient Details  Name: Abigail [name] [facility] MRN: [id number] Date of Birth: 17-Mar-1966   Important Message Given:  Yes - Medicare IM     Anise Kerns 01/01/2024, 1:53 PM

## 2024-01-01 NOTE — Progress Notes (Signed)
 Physical Therapy Treatment Patient Details Name: Abigail [name] [facility] MRN: [id number] DOB: [birth year] Today's Date: 01/01/2024   History of Present Illness Pt is a 58 y/o F admitted on 12/28/23 after presenting with confusion. Pt is being treated for acute on chronic respiratory failure with hypercapnia, acute metabolic encephalopathy. Pt also found to have acute PE. PMH: chronic pancreatitis/pancreatic pseudocyst, chronic back pain, opioid induced constipation, seizures, chronic hypoxic respiratory failure on PRN O2, fibromyalgia, basal cell carcinoma    PT Comments  Patient resting in bed upon arrival to room; does open eyes to voice and light touch this date.  Oriented to self, location; follows simple commands, though requires extensive cuing and encouragement (appears more limited by behavior than by ability).  Globally weak throughout all extremities, but no focal weakness appreciated.   Does participate with and tolerate increased mobilization this date; transitions to edge of bed with mod assist, sit/stand and standing balance with RW, mod assist +2 for safety.   Patient pulls on RW despite cuing; extensive assist for lift off and anterior weight translation, standing balance.  Poor activity tolerance, requiring/insisting on seated rest after 10-15 seconds.  Unwilling to progress gait efforts this date.  Transitioned to chair position end of session; lights on, television on, patient upright to promote more consistent alertness.  Mother present at bedside throughout session, end of session.  Did tolerate session on RA, sats >91% throughout; left on RA end of session.  RN informed/aware.  Discharge recommendations remain appropriate. If patient/family opt for return home, will require hands-on assist for all transfers and gait activities; max/dep assist for hygiene, peri-care, ADLs and iADLs.  Anticipate need for RW and BSC; per mother, has 4WRW, manual WC, scooter     If plan is  discharge home, recommend the following: A lot of help with walking and/or transfers;A lot of help with bathing/dressing/bathroom;Assist for transportation;Help with stairs or ramp for entrance;Assistance with cooking/housework   Can travel by private vehicle     No  Equipment Recommendations  Wheelchair (measurements PT);Hospital bed;Wheelchair cushion (measurements PT)    Recommendations for Other Services       Precautions / Restrictions Precautions Precautions: Fall Restrictions Weight Bearing Restrictions Per Provider Order: No     Mobility  Bed Mobility Overal bed mobility: Needs Assistance Bed Mobility: Supine to Sit, Sit to Supine     Supine to sit: Mod assist Sit to supine: Contact guard assist, Supervision        Transfers Overall transfer level: Needs assistance Equipment used: Rolling walker (2 wheels) Transfers: Sit to/from Stand Sit to Stand: Mod assist, +2 physical assistance           General transfer comment: pulls on RW despite cuing; extensive assist for lift off and anterior weight translation, standing balance.  Poor activity tolerance, requiring/insisting on seated rest after 10-15 seconds    Ambulation/Gait Ambulation/Gait assistance: Mod assist, +2 physical assistance Gait Distance (Feet): 3 Feet Assistive device: Rolling walker (2 wheels)         General Gait Details: lateral stepping edge of bed, min/mod assist +2 for safety; short, shufflin steps; poor functional balance and overall activity tolerance   Stairs             Wheelchair Mobility     Tilt Bed    Modified Rankin (Stroke Patients Only)       Balance Overall balance assessment: Needs assistance Sitting-balance support: No upper extremity supported, Feet supported Sitting balance-Leahy Scale: Fair Sitting balance -  Comments: maintains very forward flexed, kyphotic posture with head down; max cuing for correction and postural extension, upward gaze as  appropriate     Standing balance-Leahy Scale: Poor Standing balance comment: mod assist +2 for safety                            Communication Communication Communication: No apparent difficulties  Cognition Arousal: Lethargic Behavior During Therapy: Flat affect                             Following commands:  (follows verbal commands with increased time and cuing (appears limited more by behavior versus ability)) Following commands impaired: Follows one step commands inconsistently    Cueing Cueing Techniques: Verbal cues, Tactile cues, Gestural cues  Exercises Other Exercises Other Exercises: Sit/stand x2 with RW, mod assist +2; max cuing/encouragement. Other Exercises: Transitioned to chair position end of session; lights on, television on, patient upright to promote more consistent alertness.  Mother present at bedside throughout session, end of session.    General Comments        Pertinent Vitals/Pain Pain Assessment Pain Assessment: Faces Faces Pain Scale: Hurts little more Pain Location: "all over" Pain Descriptors / Indicators: Discomfort Pain Intervention(s): Limited activity within patient's tolerance, Monitored during session, Repositioned    Home Living                          Prior Function            PT Goals (current goals can now be found in the care plan section) Acute Rehab PT Goals Patient Stated Goal: none stated PT Goal Formulation: Patient unable to participate in goal setting Time For Goal Achievement: 01/13/24 Potential to Achieve Goals: Fair Progress towards PT goals: Progressing toward goals    Frequency    Min 2X/week      PT Plan      Co-evaluation              AM-PAC PT "6 Clicks" Mobility   Outcome Measure  Help needed turning from your back to your side while in a flat bed without using bedrails?: A Lot Help needed moving from lying on your back to sitting on the side of a flat  bed without using bedrails?: A Lot Help needed moving to and from a bed to a chair (including a wheelchair)?: A Lot Help needed standing up from a chair using your arms (e.g., wheelchair or bedside chair)?: A Lot Help needed to walk in hospital room?: A Lot Help needed climbing 3-5 steps with a railing? : A Lot 6 Click Score: 12    End of Session Equipment Utilized During Treatment: Gait belt   Patient left: in bed;with bed alarm set;with call bell/phone within reach;with family/visitor present Nurse Communication: Mobility status PT Visit Diagnosis: Other abnormalities of gait and mobility (R26.89);Pain     Time: 1308-6578 PT Time Calculation (min) (ACUTE ONLY): 38 min  Charges:    $Therapeutic Activity: 38-52 mins PT General Charges $$ ACUTE PT VISIT: 1 Visit                     Militza Devery H. Bevin Bucks, PT, DPT, NCS 01/01/24, 4:09 PM [phone]

## 2024-01-01 NOTE — Progress Notes (Signed)
 PROGRESS NOTE    [facility]  WUJ:[id number] DOB: 21-Aug-1965 DOA: 12/28/2023 PCP: [name], Susan K, MD  Outpatient Specialists: neurology    Brief Narrative:   Abigail [name] is a 58 y.o. female with medical history significant for chronic pancreatitis/pancreatic pseudocyst, chronic back pain pain on opiates, opioid-induced constipation , seizure disorder, chronic hypoxic respiratory failure on prn O2 at 2 L,,frequent hospitalizations most recently from 3/15 to 11/05/2023 with aspiration pneumonia and hypoxia, discharged on room air with continuation of as needed O2 who is now being admitted with acute respiratory failure with hypercapnia after presenting with confusion.  Patient was unable to be contacted for 2 days (caregiver was not around )and when relative checked in on her, she was found confused and unable to recognize the relative prompting a call to EMS.  Of note, patient was seen by her neurologist on 4/28 (note reviewed) with a diagnosis of intractable epilepsy with breakthrough seizures and was found to have subtherapeutic levels of her Keppra , Lamictal  and Tegretol  which was attributed to noncompliance.     Assessment & Plan:   Principal Problem:   Acute on chronic respiratory failure with hypercapnia (HCC) Active Problems:   Acute metabolic encephalopathy   Rheumatoid arthritis (HCC)   Asthma   Epilepsy, intractable (HCC)   Chronic respiratory failure with hypoxia (HCC)   Chronic pancreatitis (HCC)   Hypothyroidism   COPD (chronic obstructive pulmonary disease) (HCC)  # Acute encephalopathy Etiology unclear. Seizure/post-ictal, hypercarbia, infection (rhinovirus), opioid overdose among possible etiologies (alone or in concert). CT head nothing acute. Improving, today appears to be more or less back to baseline.   # Diarrhea # Fever # Rhinovirus Diarrhea and fever resolved. Respiratory panel positive for rhinovirus. Nothing aucte on ct of  abdomen - supportive care - monitor cultures  # Hypokalemia Likely 2/2 recent diarrhea. Ordered for oral potassium last two days, only today did I learn patient didn't actually drink the potassium. - oral potassium pills ordered to be given, nurse asked to notify me if patient does not take  # Seizure disorder With med noncompliance - resumed home oral meds now that she's taking po (keppra , carbamazepine , lamotrigine )  # Acute on chronic hypoxic respiratory failure # Acute subsegmental PE Hypercarbic on arrival, is on prn o2 at home. CTA showing small subsegmental acute PE. Stable on home oxygen. Carbamazepine  unfortunately reduces effectiveness of all DOACs. Do we need to treat this small incidental PE? Case discussed with Neurology (Lindzen) and oncology Debbi Failing) today. Dr. B advises proceeding with doac, particularly given small size - continue apixaban  # Hypothyroid? Don't see she's on home meds. TSH wnl  # Elevated troponins EKG non-ischemic. Mild. No chest pain. Likely demand  # Chronic pain - resumed home duloxetine    - gabapentin  on hold 2/2 sedation - oxy for home morphine   # MDD - hold home mirtazapine  2/2 sedation  # Hx GCA Finished steroids in 2024  # RA Doesn't appear currently on meds  # Debility PT advises snf, patient declines   DVT prophylaxis: lovenox  Code Status: full Family Communication: mother at bedside 5/13  Level of care: Progressive Status is: inpatient    Consultants:  neurology  Procedures: none  Antimicrobials:  None thus far    Subjective: Awake and alert today  Objective: Vitals:   12/31/23 2341 01/01/24 0424 01/01/24 0736 01/01/24 1118  BP: 138/76 131/71 129/83 134/81  Pulse: 72 70 77 78  Resp: 19 19 19 18   Temp: 98.3 F (  36.8 C) 98.4 F (36.9 C) 98.3 F (36.8 C) 97.9 F (36.6 C)  TempSrc: Oral   Oral  SpO2: 98% 95% 96% 98%  Weight:      Height:        Intake/Output Summary (Last 24 hours) at 01/01/2024  1537 Last data filed at 01/01/2024 8413 Gross per 24 hour  Intake 240 ml  Output 840 ml  Net -600 ml   Filed Weights   12/28/23 2352  Weight: 59.9 kg    Examination:  General exam: disheveled, no acute distress Respiratory system: Clear save for basilar rales Cardiovascular system: S1 & S2 heard, RRR.   Gastrointestinal system: Abdomen is nondistended, soft   Central nervous system: awake, alert, Moving all 4 Extremities: trace LE edema Skin: scattered abrasions Psychiatry: calm    Data Reviewed: I have personally reviewed following labs and imaging studies  CBC: Recent Labs  Lab 12/29/23 0020 12/30/23 0409 12/31/23 0544 01/01/24 0209  WBC 8.2 8.5 7.5 7.8  NEUTROABS 6.7  --   --   --   HGB 12.2 9.7* 10.6* 9.9*  HCT 38.2 30.1* 32.2* 29.6*  MCV 100.5* 99.3 96.4 94.0  PLT 247 211 189 173   Basic Metabolic Panel: Recent Labs  Lab 12/29/23 0028 12/29/23 1200 12/30/23 0409 12/31/23 0544 12/31/23 1220 01/01/24 0209 01/01/24 1435  NA  --  134* 137 134*  --  131* 133*  K  --  3.6 3.7 2.9*  --  2.8* 2.7*  CL  --  96* 100 97*  --  98 97*  CO2  --  29 29 29   --  26 27  GLUCOSE  --  115* 110* 91  --  78 98  BUN  --  12 12 7   --  <5* <5*  CREATININE  --  0.41* 0.51 0.34*  --  0.35* 0.41*  CALCIUM   --  7.5* 7.5* 7.7*  --  7.0* 7.4*  MG 2.2  --   --   --  1.7 2.2  --    GFR: Estimated Creatinine Clearance: 72.5 mL/min (A) (by C-G formula based on SCr of 0.41 mg/dL (L)). Liver Function Tests: Recent Labs  Lab 12/29/23 0020  AST 29  ALT 13  ALKPHOS 118  BILITOT 0.8  PROT 6.7  ALBUMIN 2.0*   Recent Labs  Lab 12/29/23 0020  LIPASE 51   No results for input(s): "AMMONIA" in the last 168 hours. Coagulation Profile: Recent Labs  Lab 12/29/23 0020  INR 1.0   Cardiac Enzymes: No results for input(s): "CKTOTAL", "CKMB", "CKMBINDEX", "TROPONINI" in the last 168 hours. BNP (last 3 results) No results for input(s): "PROBNP" in the last 8760  hours. HbA1C: No results for input(s): "HGBA1C" in the last 72 hours. CBG: No results for input(s): "GLUCAP" in the last 168 hours. Lipid Profile: No results for input(s): "CHOL", "HDL", "LDLCALC", "TRIG", "CHOLHDL", "LDLDIRECT" in the last 72 hours. Thyroid  Function Tests: No results for input(s): "TSH", "T4TOTAL", "FREET4", "T3FREE", "THYROIDAB" in the last 72 hours.  Anemia Panel: No results for input(s): "VITAMINB12", "FOLATE", "FERRITIN", "TIBC", "IRON", "RETICCTPCT" in the last 72 hours. Urine analysis:    Component Value Date/Time   COLORURINE AMBER (A) 12/29/2023 0126   APPEARANCEUR CLEAR (A) 12/29/2023 0126   APPEARANCEUR Clear 05/24/2014 1903   LABSPEC 1.021 12/29/2023 0126   LABSPEC 1.015 05/24/2014 1903   PHURINE 6.0 12/29/2023 0126   GLUCOSEU NEGATIVE 12/29/2023 0126   GLUCOSEU Negative 05/24/2014 1903   HGBUR NEGATIVE 12/29/2023 0126  BILIRUBINUR NEGATIVE 12/29/2023 0126   BILIRUBINUR Negative 05/24/2014 1903   KETONESUR 5 (A) 12/29/2023 0126   PROTEINUR 30 (A) 12/29/2023 0126   NITRITE NEGATIVE 12/29/2023 0126   LEUKOCYTESUR NEGATIVE 12/29/2023 0126   LEUKOCYTESUR Negative 05/24/2014 1903   Sepsis Labs: @LABRCNTIP (procalcitonin:4,lacticidven:4)  ) Recent Results (from the past 240 hours)  Blood culture (routine single)     Status: None (Preliminary result)   Collection Time: 12/29/23 12:20 AM   Specimen: BLOOD  Result Value Ref Range Status   Specimen Description BLOOD BLOOD LEFT ARM  Final   Special Requests   Final    BOTTLES DRAWN AEROBIC AND ANAEROBIC Blood Culture results may not be optimal due to an excessive volume of blood received in culture bottles   Culture   Final    NO GROWTH 3 DAYS Performed at [facility], [address], [place], [place] [address]    Report Status PENDING  Incomplete  C Difficile Quick Screen w PCR reflex     Status: None   Collection Time: 12/29/23  6:23 AM   Specimen: STOOL  Result Value Ref Range Status    C Diff antigen NEGATIVE NEGATIVE Final   C Diff toxin NEGATIVE NEGATIVE Final   C Diff interpretation No C. difficile detected.  Final    Comment: Performed at [facility], [address] [place], [place] [address]  Gastrointestinal Panel by PCR , Stool     Status: None   Collection Time: 12/29/23  6:23 AM   Specimen: Stool  Result Value Ref Range Status   Campylobacter species NOT DETECTED NOT DETECTED Final   Plesimonas shigelloides NOT DETECTED NOT DETECTED Final   Salmonella species NOT DETECTED NOT DETECTED Final   Yersinia enterocolitica NOT DETECTED NOT DETECTED Final   Vibrio species NOT DETECTED NOT DETECTED Final   Vibrio cholerae NOT DETECTED NOT DETECTED Final   Enteroaggregative E coli (EAEC) NOT DETECTED NOT DETECTED Final   Enteropathogenic E coli (EPEC) NOT DETECTED NOT DETECTED Final   Enterotoxigenic E coli (ETEC) NOT DETECTED NOT DETECTED Final   Shiga like toxin producing E coli (STEC) NOT DETECTED NOT DETECTED Final   Shigella/Enteroinvasive E coli (EIEC) NOT DETECTED NOT DETECTED Final   Cryptosporidium NOT DETECTED NOT DETECTED Final   Cyclospora cayetanensis NOT DETECTED NOT DETECTED Final   Entamoeba histolytica NOT DETECTED NOT DETECTED Final   Giardia lamblia NOT DETECTED NOT DETECTED Final   Adenovirus F40/41 NOT DETECTED NOT DETECTED Final   Astrovirus NOT DETECTED NOT DETECTED Final   Norovirus GI/GII NOT DETECTED NOT DETECTED Final   Rotavirus A NOT DETECTED NOT DETECTED Final   Sapovirus (I, II, IV, and V) NOT DETECTED NOT DETECTED Final    Comment: Performed at [facility], [address] [place], [place] [address]  SARS Coronavirus 2 by RT PCR (hospital order, performed in [facility] hospital lab) *cepheid single result test* Anterior Nasal Swab     Status: None   Collection Time: 12/29/23  9:44 AM   Specimen: Anterior Nasal Swab  Result Value Ref Range Status   SARS Coronavirus 2 by RT PCR NEGATIVE NEGATIVE Final     Comment: (NOTE) SARS-CoV-2 target nucleic acids are NOT DETECTED.  The SARS-CoV-2 RNA is generally detectable in upper and lower respiratory specimens during the acute phase of infection. The lowest concentration of SARS-CoV-2 viral copies this assay can detect is 250 copies / mL. A negative result does not preclude SARS-CoV-2 infection and should not be used  as the sole basis for treatment or other patient management decisions.  A negative result may occur with improper specimen collection / handling, submission of specimen other than nasopharyngeal swab, presence of viral mutation(s) within the areas targeted by this assay, and inadequate number of viral copies (<250 copies / mL). A negative result must be combined with clinical observations, patient history, and epidemiological information.  Fact Sheet for Patients:   RoadLapTop.co.za  Fact Sheet for Healthcare Providers: http://kim-miller.com/  This test is not yet approved or  cleared by the [place]  FDA and has been authorized for detection and/or diagnosis of SARS-CoV-2 by FDA under an Emergency Use Authorization (EUA).  This EUA will remain in effect (meaning this test can be used) for the duration of the COVID-19 declaration under Section 564(b)(1) of the Act, 21 U.S.C. section 360bbb-3(b)(1), unless the authorization is terminated or revoked sooner.  Performed at [facility], [address] [place], [place] [address]   Respiratory (~20 pathogens) panel by PCR     Status: Abnormal   Collection Time: 12/29/23  9:44 AM   Specimen: Nasopharyngeal Swab; Respiratory  Result Value Ref Range Status   Adenovirus NOT DETECTED NOT DETECTED Final   Coronavirus 229E NOT DETECTED NOT DETECTED Final    Comment: (NOTE) The Coronavirus on the Respiratory Panel, DOES NOT test for the novel  Coronavirus (2019 nCoV)    Coronavirus HKU1 NOT DETECTED NOT DETECTED Final    Coronavirus NL63 NOT DETECTED NOT DETECTED Final   Coronavirus OC43 NOT DETECTED NOT DETECTED Final   Metapneumovirus NOT DETECTED NOT DETECTED Final   Rhinovirus / Enterovirus DETECTED (A) NOT DETECTED Final   Influenza A NOT DETECTED NOT DETECTED Final   Influenza B NOT DETECTED NOT DETECTED Final   Parainfluenza Virus 1 NOT DETECTED NOT DETECTED Final   Parainfluenza Virus 2 NOT DETECTED NOT DETECTED Final   Parainfluenza Virus 3 NOT DETECTED NOT DETECTED Final   Parainfluenza Virus 4 NOT DETECTED NOT DETECTED Final   Respiratory Syncytial Virus NOT DETECTED NOT DETECTED Final   Bordetella pertussis NOT DETECTED NOT DETECTED Final   Bordetella Parapertussis NOT DETECTED NOT DETECTED Final   Chlamydophila pneumoniae NOT DETECTED NOT DETECTED Final   Mycoplasma pneumoniae NOT DETECTED NOT DETECTED Final    Comment: Performed at [facility], [address], [place], [place] [address]  Culture, blood (single) w Reflex to ID Panel     Status: None (Preliminary result)   Collection Time: 12/31/23  5:44 AM   Specimen: BLOOD LEFT ARM  Result Value Ref Range Status   Specimen Description BLOOD LEFT ARM  Final   Special Requests   Final    BOTTLES DRAWN AEROBIC AND ANAEROBIC Blood Culture results may not be optimal due to an inadequate volume of blood received in culture bottles   Culture   Final    NO GROWTH 1 DAY Performed at [facility], [address] [place], [place] [address]    Report Status PENDING  Incomplete         Radiology Studies: US  Venous Img Lower Bilateral (DVT) Result Date: 12/31/2023 CLINICAL DATA:  Small volume pulmonary embolism in the right lower lobe. EXAM: BILATERAL LOWER EXTREMITY VENOUS DOPPLER ULTRASOUND TECHNIQUE: Gray-scale sonography with graded compression, as well as color Doppler and duplex ultrasound were performed to evaluate the lower extremity deep venous systems from the level of the common femoral vein and including the common  femoral, femoral, profunda femoral, popliteal and calf veins including  the posterior tibial, peroneal and gastrocnemius veins when visible. The superficial great saphenous vein was also interrogated. Spectral Doppler was utilized to evaluate flow at rest and with distal augmentation maneuvers in the common femoral, femoral and popliteal veins. COMPARISON:  Prior left lower extremity study on 05/17/2014 FINDINGS: RIGHT LOWER EXTREMITY Common Femoral Vein: No evidence of thrombus. Normal compressibility, respiratory phasicity and response to augmentation. Saphenofemoral Junction: No evidence of thrombus. Normal compressibility and flow on color Doppler imaging. Profunda Femoral Vein: No evidence of thrombus. Normal compressibility and flow on color Doppler imaging. Femoral Vein: No evidence of thrombus. Normal compressibility, respiratory phasicity and response to augmentation. Popliteal Vein: No evidence of thrombus. Normal compressibility, respiratory phasicity and response to augmentation. Calf Veins: Limited visualization but no evidence of thrombus. Normal compressibility and flow on color Doppler imaging. Superficial Great Saphenous Vein: No evidence of thrombus. Normal compressibility. Venous Reflux:  None. Other Findings: No evidence of superficial thrombophlebitis or abnormal fluid collection. LEFT LOWER EXTREMITY Common Femoral Vein: No evidence of thrombus. Normal compressibility, respiratory phasicity and response to augmentation. Saphenofemoral Junction: No evidence of thrombus. Normal compressibility and flow on color Doppler imaging. Profunda Femoral Vein: No evidence of thrombus. Normal compressibility and flow on color Doppler imaging. Femoral Vein: No evidence of thrombus. Normal compressibility, respiratory phasicity and response to augmentation. Popliteal Vein: No evidence of thrombus. Normal compressibility, respiratory phasicity and response to augmentation. Calf Veins: Limited visualization but  no evidence of thrombus. Normal compressibility and flow on color Doppler imaging. Superficial Great Saphenous Vein: No evidence of thrombus. Normal compressibility. Venous Reflux:  None. Other Findings: No evidence of superficial thrombophlebitis or abnormal fluid collection. IMPRESSION: No evidence of deep venous thrombosis in either lower extremity. Electronically Signed   By: Erica Hau M.D.   On: 12/31/2023 09:03        Scheduled Meds:  apixaban  10 mg Oral BID   Followed by   Cecily Cohen ON 01/07/2024] apixaban  5 mg Oral BID   carbamazepine   100 mg Oral BID   DULoxetine   60 mg Oral BID   feeding supplement  237 mL Oral BID BM   lamoTRIgine   50 mg Oral BID   levETIRAcetam   1,500 mg Oral BID   pantoprazole   40 mg Oral Daily   potassium chloride   80 mEq Oral Once   Continuous Infusions:     LOS: 3 days   CRITICAL CARE Performed by: Josem Nick, MD [facility]   If 7PM-7AM, please contact night-coverage [email or web address] Password [facility] 01/01/2024, 3:37 PM

## 2024-01-02 ENCOUNTER — Other Ambulatory Visit: Payer: Self-pay

## 2024-01-02 DIAGNOSIS — I2699 Other pulmonary embolism without acute cor pulmonale: Secondary | ICD-10-CM | POA: Insufficient documentation

## 2024-01-02 DIAGNOSIS — J9622 Acute and chronic respiratory failure with hypercapnia: Secondary | ICD-10-CM | POA: Diagnosis not present

## 2024-01-02 LAB — BASIC METABOLIC PANEL WITH GFR
Anion gap: 8 (ref 5–15)
BUN: <5 mg/dL — ABNORMAL LOW (ref 6–20)
CO2: 28 mmol/L (ref 22–32)
Calcium: 7.6 mg/dL — ABNORMAL LOW (ref 8.9–10.3)
Chloride: 100 mmol/L (ref 98–111)
Creatinine, Ser: 0.35 mg/dL — ABNORMAL LOW (ref 0.44–1.00)
GFR, Estimated: >60 mL/min (ref >60)
Glucose, Bld: 79 mg/dL (ref 70–99)
Potassium: 4.1 mmol/L (ref 3.5–5.1)
Sodium: 136 mmol/L (ref 135–145)

## 2024-01-02 LAB — MAGNESIUM: Magnesium: 2.1 mg/dL (ref 1.7–2.4)

## 2024-01-02 MED ORDER — CARBAMAZEPINE ER 100 MG PO TB12
100.0000 mg | ORAL_TABLET | Freq: Two times a day (BID) | ORAL | 2 refills | Status: AC
  Filled 2024-01-02: qty 60, 30d supply, fill #0

## 2024-01-02 MED ORDER — LAMOTRIGINE 25 MG PO TABS
50.0000 mg | ORAL_TABLET | Freq: Two times a day (BID) | ORAL | 1 refills | Status: DC
  Filled 2024-01-02: qty 60, 15d supply, fill #0

## 2024-01-02 MED ORDER — APIXABAN 5 MG PO TABS
ORAL_TABLET | ORAL | 3 refills | Status: DC
  Filled 2024-01-02: qty 70, 30d supply, fill #0
  Filled 2024-01-28: qty 60, 30d supply, fill #1

## 2024-01-02 MED ORDER — LEVETIRACETAM 750 MG PO TABS
1500.0000 mg | ORAL_TABLET | Freq: Two times a day (BID) | ORAL | 1 refills | Status: DC
  Filled 2024-01-02: qty 60, 15d supply, fill #0

## 2024-01-02 NOTE — Discharge Summary (Signed)
 [name] MVH:[id number] DOB: [birth year] DOA: 12/28/2023  PCP: [name], Susan K, MD  Admit date: 12/28/2023 Discharge date: 01/02/2024  Time spent: 35 minutes  Recommendations for Outpatient Follow-up:  Pcp and neurology f/u Recommend gradual dose reduction and eventual discontinuation of opioids     Discharge Diagnoses:  Principal Problem:   Acute on chronic respiratory failure with hypercapnia (HCC) Active Problems:   Acute metabolic encephalopathy   Rheumatoid arthritis (HCC)   Asthma   Epilepsy, intractable (HCC)   Chronic respiratory failure with hypoxia (HCC)   Chronic pancreatitis (HCC)   Hypothyroidism   COPD (chronic obstructive pulmonary disease) (HCC)   Discharge Condition: improved  Diet recommendation: heart healthy  Filed Weights   12/28/23 2352  Weight: 59.9 kg    History of present illness:  From admission h and p:  [employer] is a 58 y.o. female with medical history significant for chronic pancreatitis/pancreatic pseudocyst, chronic back pain pain on opiates, opioid-induced constipation , seizure disorder, chronic hypoxic respiratory failure on prn O2 at 2 L,,frequent hospitalizations most recently from 3/15 to 11/05/2023 with aspiration pneumonia and hypoxia, discharged on room air with continuation of as needed O2 who is now being admitted with acute respiratory failure with hypercapnia after presenting with confusion.  Patient was unable to be contacted for 2 days (caregiver was not around )and when relative checked in on her, she was found confused and unable to recognize the relative prompting a call to EMS.  Of note, patient was seen by her neurologist on 4/28 (note reviewed) with a diagnosis of intractable epilepsy with breakthrough seizures and was found to have subtherapeutic levels of her Keppra , Lamictal  and Tegretol  which was attributed to noncompliance.  Hospital Course:   # Acute encephalopathy Etiology unclear.  Seizure/post-ictal, hypercarbia, infection (rhinovirus), opioid overdose among possible etiologies (alone or in concert). CT head nothing acute. Resolved, appears back to baseline   # Diarrhea # Fever # Rhinovirus Diarrhea and fever resolved. Respiratory panel positive for rhinovirus. Nothing aucte on ct of abdomen - supportive care   # Hypokalemia Resolved with repletion, resume home daily potassium   # Seizure disorder With long history of poor med compliance. Home meds resumed. Neurology followed here.   # Acute on chronic hypoxic respiratory failure # Acute subsegmental PE Hypercarbic on arrival, is on prn o2 at home. CTA showing small subsegmental acute PE. Stable on home oxygen. Carbamazepine  unfortunately reduces effectiveness of all DOACs. Do we need to treat this small incidental PE? Case discussed with Neurology ([place]) and oncology Debbi Failing) today. Dr. B advises proceeding with doac, particularly given small size - continue apixaban   # Hypothyroid? Don't see she's on home meds. TSH wnl   # Elevated troponins EKG non-ischemic. Mild. No chest pain. Likely demand   # Chronic pain Advise outpatient wean off opioids  # Debility PT advises snf, patient declines. Appears to be at her baseline. She reports she has home DME. Home health orders placed.  Procedures: none   Consultations: neurology  Discharge Exam: Vitals:   01/02/24 0400 01/02/24 0845  BP: 129/79 137/71  Pulse: 84 81  Resp: 17   Temp: 98.2 F (36.8 C) 98.2 F (36.8 C)  SpO2: 90% 90%    General: NAD Cardiovascular: RRR Respiratory: CTAB  Discharge Instructions   Discharge Instructions     Diet - low sodium heart healthy   Complete by: As directed    Increase activity slowly   Complete by: As directed  Allergies as of 01/02/2024       Reactions   Nsaids Hives   Tapentadol Swelling, Rash, Other (See Comments)   Nucynta- Made her deathly sick   Cephalexin  Other (See  Comments)   Reaction not cited   Codeine Other (See Comments)   Reaction not cited   Darvocet [propoxyphene N-acetaminophen ] Other (See Comments)   Reaction not cited   Latex Other (See Comments)   Reaction not cited   Silicone Other (See Comments)   Reaction not cited   Sulfa Antibiotics Hives   Tape Other (See Comments)   Reaction not cited   Meloxicam Rash        Medication List     STOP taking these medications    phenytoin  100 MG ER capsule Commonly known as: DILANTIN        TAKE these medications    albuterol  108 (90 Base) MCG/ACT inhaler Commonly known as: VENTOLIN  HFA Inhale 2 puffs into the lungs. Take two (2) puffs by mouth every 4 to 6 hours as needed for wheezing or shortness of breath   apixaban 5 MG Tabs tablet Commonly known as: ELIQUIS Take 2 tabs twice a day for 5 days, then take 1 tab twice a day   bisacodyl  5 MG EC tablet Generic drug: bisacodyl  Take 1 tablet (5 mg total) by mouth daily as needed for moderate constipation.   carbamazepine  100 MG 12 hr tablet Commonly known as: TEGRETOL  XR Take 1 tablet (100 mg total) by mouth 2 (two) times daily.   DULoxetine  60 MG capsule Commonly known as: CYMBALTA  Take 60 mg by mouth 2 (two) times daily. What changed: Another medication with the same name was removed. Continue taking this medication, and follow the directions you see here.   furosemide  20 MG tablet Commonly known as: LASIX  Take 1 tablet (20 mg total) by mouth daily.   gabapentin  300 MG capsule Commonly known as: NEURONTIN  Take 1 capsule (300 mg total) by mouth 4 (four) times daily.   lamoTRIgine  25 MG tablet Commonly known as: LAMICTAL  Take 2 tablets (50 mg total) by mouth 2 (two) times daily. What changed: See the new instructions.   levETIRAcetam  750 MG tablet Commonly known as: KEPPRA  Take 2 tablets (1,500 mg total) by mouth 2 (two) times daily. What changed:  how much to take when to take this Another medication with the  same name was removed. Continue taking this medication, and follow the directions you see here.   Melatonin 10 MG Tabs Take 10 mg by mouth at bedtime.   mirtazapine  30 MG tablet Commonly known as: REMERON  Take 1 tablet (30 mg total) by mouth at bedtime.   morphine  15 MG tablet Commonly known as: MSIR Take 1 tablet (15 mg total) by mouth every 8 (eight) hours as needed for severe pain (pain score 7-10).   NON FORMULARY Apply 1 Application topically 3 (three) times daily as needed. Rock [employer] Cream (Similar to [employer])   ondansetron  8 MG disintegrating tablet Commonly known as: ZOFRAN -ODT Take 1 tablet (8 mg total) by mouth every 8 (eight) hours as needed for nausea.   pantoprazole  40 MG tablet Commonly known as: PROTONIX  Take 40 mg by mouth daily.   potassium chloride  10 MEQ tablet Commonly known as: KLOR-CON  M Take 1 tablet (10 mEq total) by mouth daily. What changed: Another medication with the same name was removed. Continue taking this medication, and follow the directions you see here.       Allergies  Allergen Reactions   Nsaids Hives   Tapentadol Swelling, Rash and Other (See Comments)    Nucynta- Made her deathly sick   Cephalexin  Other (See Comments)    Reaction not cited   Codeine Other (See Comments)    Reaction not cited   Darvocet [Propoxyphene N-Acetaminophen ] Other (See Comments)    Reaction not cited   Latex Other (See Comments)    Reaction not cited   Silicone Other (See Comments)    Reaction not cited   Sulfa Antibiotics Hives   Tape Other (See Comments)    Reaction not cited   Meloxicam Rash    Follow-up Information     [name], Susan K, MD Follow up.   Specialty: Family Medicine Contact information: [address] [place] [address] [phone]         Your neurologist Follow up.                   The results of significant diagnostics from this hospitalization (including imaging, microbiology, ancillary and laboratory)  are listed below for reference.    Significant Diagnostic Studies: US  Venous Img Lower Bilateral (DVT) Result Date: 12/31/2023 CLINICAL DATA:  Small volume pulmonary embolism in the right lower lobe. EXAM: BILATERAL LOWER EXTREMITY VENOUS DOPPLER ULTRASOUND TECHNIQUE: Gray-scale sonography with graded compression, as well as color Doppler and duplex ultrasound were performed to evaluate the lower extremity deep venous systems from the level of the common femoral vein and including the common femoral, femoral, profunda femoral, popliteal and calf veins including the posterior tibial, peroneal and gastrocnemius veins when visible. The superficial great saphenous vein was also interrogated. Spectral Doppler was utilized to evaluate flow at rest and with distal augmentation maneuvers in the common femoral, femoral and popliteal veins. COMPARISON:  Prior left lower extremity study on 05/17/2014 FINDINGS: RIGHT LOWER EXTREMITY Common Femoral Vein: No evidence of thrombus. Normal compressibility, respiratory phasicity and response to augmentation. Saphenofemoral Junction: No evidence of thrombus. Normal compressibility and flow on color Doppler imaging. Profunda Femoral Vein: No evidence of thrombus. Normal compressibility and flow on color Doppler imaging. Femoral Vein: No evidence of thrombus. Normal compressibility, respiratory phasicity and response to augmentation. Popliteal Vein: No evidence of thrombus. Normal compressibility, respiratory phasicity and response to augmentation. Calf Veins: Limited visualization but no evidence of thrombus. Normal compressibility and flow on color Doppler imaging. Superficial Great Saphenous Vein: No evidence of thrombus. Normal compressibility. Venous Reflux:  None. Other Findings: No evidence of superficial thrombophlebitis or abnormal fluid collection. LEFT LOWER EXTREMITY Common Femoral Vein: No evidence of thrombus. Normal compressibility, respiratory phasicity and response  to augmentation. Saphenofemoral Junction: No evidence of thrombus. Normal compressibility and flow on color Doppler imaging. Profunda Femoral Vein: No evidence of thrombus. Normal compressibility and flow on color Doppler imaging. Femoral Vein: No evidence of thrombus. Normal compressibility, respiratory phasicity and response to augmentation. Popliteal Vein: No evidence of thrombus. Normal compressibility, respiratory phasicity and response to augmentation. Calf Veins: Limited visualization but no evidence of thrombus. Normal compressibility and flow on color Doppler imaging. Superficial Great Saphenous Vein: No evidence of thrombus. Normal compressibility. Venous Reflux:  None. Other Findings: No evidence of superficial thrombophlebitis or abnormal fluid collection. IMPRESSION: No evidence of deep venous thrombosis in either lower extremity. Electronically Signed   By: Erica Hau M.D.   On: 12/31/2023 09:03   CT ABDOMEN PELVIS W CONTRAST Result Date: 12/29/2023 CLINICAL DATA:  Encephalopathy with fever and diarrhea. EXAM: CT ANGIOGRAPHY CHEST CT ABDOMEN AND PELVIS WITH  CONTRAST TECHNIQUE: Multidetector CT imaging of the chest was performed using the standard protocol during bolus administration of intravenous contrast. Multiplanar CT image reconstructions and MIPs were obtained to evaluate the vascular anatomy. Multidetector CT imaging of the abdomen and pelvis was performed using the standard protocol during bolus administration of intravenous contrast. RADIATION DOSE REDUCTION: This exam was performed according to the departmental dose-optimization program which includes automated exposure control, adjustment of the mA and/or kV according to patient size and/or use of iterative reconstruction technique. CONTRAST:  OMNIPAQUE  IOHEXOL  300 MG/ML  SOLN COMPARISON:  Abdomen and pelvis CT 11/02/2023.  Chest CT 07/15/2023 FINDINGS: CTA CHEST FINDINGS Cardiovascular: Satisfactory opacification of the  pulmonary arteries to the segmental level. Single subsegmental pulmonary embolism into the basal right lower lobe. Normal heart size. No pericardial effusion. Mediastinum/Nodes: Negative for mass or adenopathy. No esophageal thickening. Lungs/Pleura: The central airways are clear. Generalized airway thickening with mild atelectasis and trace bilateral pleural effusion. Musculoskeletal: Numerous compression fractures seen at the T1, T2, and T3 superior endplates with mild depression. More moderate to advanced compressive deformities at T5, T7, T8, T9, T10, and T11. At T10 and T11 fractures are new from 2020 for and associated with gas containing cleft. Lower thoracic disc space narrowing and facet spurring with foraminal stenoses. Remote rib fractures. Review of the MIP images confirms the above findings. CT ABDOMEN and PELVIS FINDINGS Hepatobiliary: Hepatic steatosis. No focal abnormality.No evidence of biliary obstruction or stone. Pancreas: Sequela of chronic pancreatitis with multiple pseudocysts. All of the current pseudocysts were seen on prior, dominant cyst at the pancreatic tail and splenic hilum measures 6.7 cm, increased with similar degree of tracking along the upper aspect of the spleen. None of the cysts show interval gas or other definitive sign of hemorrhage or infection. Active pancreatitis is likely present given the degree of upper abdominal stranding. Small but notable cyst tracking upward at the gastrohepatic ligament. Spleen: Pseudocysts tracking along the upper spleen as noted above. Adrenals/Urinary Tract: Negative adrenals. No hydronephrosis or stone. Unremarkable bladder. Stomach/Bowel: Generalized submucosal edema affecting the colon. No bowel obstruction. 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 No acute vascular finding. Extensive atheromatous calcification of the aorta and iliacs. Reproductive:No pathologic findings. Other: No ascites or  pneumoperitoneum. Musculoskeletal: No acute finding. Remote T12, L1, and L2 compression fractures. Anterior lumbar fusion with solid arthrodesis spanning L3-S1. Total right hip arthroplasty. Critical Value/emergent results were called by telephone at the time of interpretation on 12/29/2023 at 10:29 am to provider [facility] , who verbally acknowledged these results. Review of the MIP images confirms the above findings. IMPRESSION: Chest CT: Positive for single subsegmental pulmonary embolism in the right lower lobe. Atelectasis and small pleural effusions. Numerous chronic compression fractures, T10 and T12 fractures are unhealed. Abdomen: Generalized colitis. Chronic pancreatitis with numerous pseudo cysts. Dominant cyst at the pancreatic tail and dissecting around the upper spleen has increased in size to 6.7 cm, but no signs of super infection. Mass effect on the stomach is unchanged. Atherosclerosis, liver steatosis. Chronic gastric varices. Electronically Signed   By: Ronnette Coke M.D.   On: 12/29/2023 10:30   CT Angio Chest Pulmonary Embolism (PE) W or WO Contrast Result Date: 12/29/2023 CLINICAL DATA:  Encephalopathy with fever and diarrhea. EXAM: CT ANGIOGRAPHY CHEST CT ABDOMEN AND PELVIS WITH CONTRAST TECHNIQUE: Multidetector CT imaging of the chest was performed using the standard protocol during bolus administration of intravenous contrast. Multiplanar CT image reconstructions and MIPs were obtained to  evaluate the vascular anatomy. Multidetector CT imaging of the abdomen and pelvis was performed using the standard protocol during bolus administration of intravenous contrast. RADIATION DOSE REDUCTION: This exam was performed according to the departmental dose-optimization program which includes automated exposure control, adjustment of the mA and/or kV according to patient size and/or use of iterative reconstruction technique. CONTRAST:  OMNIPAQUE  IOHEXOL  300 MG/ML  SOLN COMPARISON:  Abdomen  and pelvis CT 11/02/2023.  Chest CT 07/15/2023 FINDINGS: CTA CHEST FINDINGS Cardiovascular: Satisfactory opacification of the pulmonary arteries to the segmental level. Single subsegmental pulmonary embolism into the basal right lower lobe. Normal heart size. No pericardial effusion. Mediastinum/Nodes: Negative for mass or adenopathy. No esophageal thickening. Lungs/Pleura: The central airways are clear. Generalized airway thickening with mild atelectasis and trace bilateral pleural effusion. Musculoskeletal: Numerous compression fractures seen at the T1, T2, and T3 superior endplates with mild depression. More moderate to advanced compressive deformities at T5, T7, T8, T9, T10, and T11. At T10 and T11 fractures are new from 2020 for and associated with gas containing cleft. Lower thoracic disc space narrowing and facet spurring with foraminal stenoses. Remote rib fractures. Review of the MIP images confirms the above findings. CT ABDOMEN and PELVIS FINDINGS Hepatobiliary: Hepatic steatosis. No focal abnormality.No evidence of biliary obstruction or stone. Pancreas: Sequela of chronic pancreatitis with multiple pseudocysts. All of the current pseudocysts were seen on prior, dominant cyst at the pancreatic tail and splenic hilum measures 6.7 cm, increased with similar degree of tracking along the upper aspect of the spleen. None of the cysts show interval gas or other definitive sign of hemorrhage or infection. Active pancreatitis is likely present given the degree of upper abdominal stranding. Small but notable cyst tracking upward at the gastrohepatic ligament. Spleen: Pseudocysts tracking along the upper spleen as noted above. Adrenals/Urinary Tract: Negative adrenals. No hydronephrosis or stone. Unremarkable bladder. Stomach/Bowel: Generalized submucosal edema affecting the colon. No bowel obstruction. 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 No acute vascular finding.  Extensive atheromatous calcification of the aorta and iliacs. Reproductive:No pathologic findings. Other: No ascites or pneumoperitoneum. Musculoskeletal: No acute finding. Remote T12, L1, and L2 compression fractures. Anterior lumbar fusion with solid arthrodesis spanning L3-S1. Total right hip arthroplasty. Critical Value/emergent results were called by telephone at the time of interpretation on 12/29/2023 at 10:29 am to provider [facility] , who verbally acknowledged these results. Review of the MIP images confirms the above findings. IMPRESSION: Chest CT: Positive for single subsegmental pulmonary embolism in the right lower lobe. Atelectasis and small pleural effusions. Numerous chronic compression fractures, T10 and T12 fractures are unhealed. Abdomen: Generalized colitis. Chronic pancreatitis with numerous pseudo cysts. Dominant cyst at the pancreatic tail and dissecting around the upper spleen has increased in size to 6.7 cm, but no signs of super infection. Mass effect on the stomach is unchanged. Atherosclerosis, liver steatosis. Chronic gastric varices. Electronically Signed   By: Ronnette Coke M.D.   On: 12/29/2023 10:30   CT HEAD WO CONTRAST ( ) Result Date: 12/29/2023 CLINICAL DATA:  Encephalopathy.  Intractable epilepsy EXAM: CT HEAD WITHOUT CONTRAST TECHNIQUE: Contiguous axial images were obtained from the base of the skull through the vertex without intravenous contrast. RADIATION DOSE REDUCTION: This exam was performed according to the departmental dose-optimization program which includes automated exposure control, adjustment of the mA and/or kV according to patient size and/or use of iterative reconstruction technique. COMPARISON:  10/08/2023 FINDINGS: Brain: No evidence of acute infarction, hemorrhage, hydrocephalus, extra-axial collection or mass  lesion/mass effect. Motion artifact towards the vertex. Vascular: No hyperdense vessel or unexpected calcification. Skull: Normal. Negative for  fracture or focal lesion. Sinuses/Orbits: No acute finding IMPRESSION: Negative motion degraded head CT. Electronically Signed   By: Ronnette Coke M.D.   On: 12/29/2023 09:52   DG Chest Port 1 View Result Date: 12/29/2023 CLINICAL DATA:  Altered mental status and failure to thrive. EXAM: PORTABLE CHEST 1 VIEW COMPARISON:  November 02, 2023 FINDINGS: The heart size and mediastinal contours are within normal limits. Low lung volumes are noted. Mild areas of linear scarring and/or atelectasis are seen within the bilateral lung bases. No pleural effusion or pneumothorax is identified. Postoperative changes are seen within the lower cervical spine. Multilevel degenerative changes are present throughout the thoracic spine. IMPRESSION: Low lung volumes with mild bibasilar linear scarring and/or atelectasis. Electronically Signed   By: Virgle Grime M.D.   On: 12/29/2023 01:07    Microbiology: Recent Results (from the past 240 hours)  Blood culture (routine single)     Status: None (Preliminary result)   Collection Time: 12/29/23 12:20 AM   Specimen: BLOOD  Result Value Ref Range Status   Specimen Description BLOOD BLOOD LEFT ARM  Final   Special Requests   Final    BOTTLES DRAWN AEROBIC AND ANAEROBIC Blood Culture results may not be optimal due to an excessive volume of blood received in culture bottles   Culture   Final    NO GROWTH 4 DAYS Performed at [facility], [address], [place], [place] [address]    Report Status PENDING  Incomplete  C Difficile Quick Screen w PCR reflex     Status: None   Collection Time: 12/29/23  6:23 AM   Specimen: STOOL  Result Value Ref Range Status   C Diff antigen NEGATIVE NEGATIVE Final   C Diff toxin NEGATIVE NEGATIVE Final   C Diff interpretation No C. difficile detected.  Final    Comment: Performed at [facility], [address] [place], [place] [address]  Gastrointestinal Panel by PCR , Stool     Status: None   Collection  Time: 12/29/23  6:23 AM   Specimen: Stool  Result Value Ref Range Status   Campylobacter species NOT DETECTED NOT DETECTED Final   Plesimonas shigelloides NOT DETECTED NOT DETECTED Final   Salmonella species NOT DETECTED NOT DETECTED Final   Yersinia enterocolitica NOT DETECTED NOT DETECTED Final   Vibrio species NOT DETECTED NOT DETECTED Final   Vibrio cholerae NOT DETECTED NOT DETECTED Final   Enteroaggregative E coli (EAEC) NOT DETECTED NOT DETECTED Final   Enteropathogenic E coli (EPEC) NOT DETECTED NOT DETECTED Final   Enterotoxigenic E coli (ETEC) NOT DETECTED NOT DETECTED Final   Shiga like toxin producing E coli (STEC) NOT DETECTED NOT DETECTED Final   Shigella/Enteroinvasive E coli (EIEC) NOT DETECTED NOT DETECTED Final   Cryptosporidium NOT DETECTED NOT DETECTED Final   Cyclospora cayetanensis NOT DETECTED NOT DETECTED Final   Entamoeba histolytica NOT DETECTED NOT DETECTED Final   Giardia lamblia NOT DETECTED NOT DETECTED Final   Adenovirus F40/41 NOT DETECTED NOT DETECTED Final   Astrovirus NOT DETECTED NOT DETECTED Final   Norovirus GI/GII NOT DETECTED NOT DETECTED Final   Rotavirus A NOT DETECTED NOT DETECTED Final   Sapovirus (I, II, IV, and V) NOT DETECTED NOT DETECTED Final    Comment: Performed at [facility], [address], [place], [place] [address]  SARS Coronavirus 2 by RT PCR (hospital  order, performed in [facility] hospital lab) *cepheid single result test* Anterior Nasal Swab     Status: None   Collection Time: 12/29/23  9:44 AM   Specimen: Anterior Nasal Swab  Result Value Ref Range Status   SARS Coronavirus 2 by RT PCR NEGATIVE NEGATIVE Final    Comment: (NOTE) SARS-CoV-2 target nucleic acids are NOT DETECTED.  The SARS-CoV-2 RNA is generally detectable in upper and lower respiratory specimens during the acute phase of infection. The lowest concentration of SARS-CoV-2 viral copies this assay can detect is 250 copies / mL. A negative result  does not preclude SARS-CoV-2 infection and should not be used as the sole basis for treatment or other patient management decisions.  A negative result may occur with improper specimen collection / handling, submission of specimen other than nasopharyngeal swab, presence of viral mutation(s) within the areas targeted by this assay, and inadequate number of viral copies (<250 copies / mL). A negative result must be combined with clinical observations, patient history, and epidemiological information.  Fact Sheet for Patients:   RoadLapTop.co.za  Fact Sheet for Healthcare Providers: http://kim-miller.com/  This test is not yet approved or  cleared by the [place]  FDA and has been authorized for detection and/or diagnosis of SARS-CoV-2 by FDA under an Emergency Use Authorization (EUA).  This EUA will remain in effect (meaning this test can be used) for the duration of the COVID-19 declaration under Section 564(b)(1) of the Act, 21 U.S.C. section 360bbb-3(b)(1), unless the authorization is terminated or revoked sooner.  Performed at [facility], [address] [place], [place] [address]   Respiratory (~20 pathogens) panel by PCR     Status: Abnormal   Collection Time: 12/29/23  9:44 AM   Specimen: Nasopharyngeal Swab; Respiratory  Result Value Ref Range Status   Adenovirus NOT DETECTED NOT DETECTED Final   Coronavirus 229E NOT DETECTED NOT DETECTED Final    Comment: (NOTE) The Coronavirus on the Respiratory Panel, DOES NOT test for the novel  Coronavirus (2019 nCoV)    Coronavirus HKU1 NOT DETECTED NOT DETECTED Final   Coronavirus NL63 NOT DETECTED NOT DETECTED Final   Coronavirus OC43 NOT DETECTED NOT DETECTED Final   Metapneumovirus NOT DETECTED NOT DETECTED Final   Rhinovirus / Enterovirus DETECTED (A) NOT DETECTED Final   Influenza A NOT DETECTED NOT DETECTED Final   Influenza B NOT DETECTED NOT DETECTED Final    Parainfluenza Virus 1 NOT DETECTED NOT DETECTED Final   Parainfluenza Virus 2 NOT DETECTED NOT DETECTED Final   Parainfluenza Virus 3 NOT DETECTED NOT DETECTED Final   Parainfluenza Virus 4 NOT DETECTED NOT DETECTED Final   Respiratory Syncytial Virus NOT DETECTED NOT DETECTED Final   Bordetella pertussis NOT DETECTED NOT DETECTED Final   Bordetella Parapertussis NOT DETECTED NOT DETECTED Final   Chlamydophila pneumoniae NOT DETECTED NOT DETECTED Final   Mycoplasma pneumoniae NOT DETECTED NOT DETECTED Final    Comment: Performed at [facility], [address], [place], [place] [address]  Culture, blood (single) w Reflex to ID Panel     Status: None (Preliminary result)   Collection Time: 12/31/23  5:44 AM   Specimen: BLOOD LEFT ARM  Result Value Ref Range Status   Specimen Description BLOOD LEFT ARM  Final   Special Requests   Final    BOTTLES DRAWN AEROBIC AND ANAEROBIC Blood Culture results may not be optimal due to an inadequate volume of blood received in culture bottles   Culture  Final    NO GROWTH 2 DAYS Performed at [facility], [address] [place], [place] [address]    Report Status PENDING  Incomplete     Labs: Basic Metabolic Panel: Recent Labs  Lab 12/29/23 0028 12/29/23 1200 12/31/23 0544 12/31/23 1220 01/01/24 0209 01/01/24 1435 01/01/24 2031 01/02/24 0403  NA  --    < > 134*  --  131* 133* 133* 136  K  --    < > 2.9*  --  2.8* 2.7* 3.5 4.1  CL  --    < > 97*  --  98 97* 97* 100  CO2  --    < > 29  --  26 27 27 28   GLUCOSE  --    < > 91  --  78 98 93 79  BUN  --    < > 7  --  <5* <5* 5* <5*  CREATININE  --    < > 0.34*  --  0.35* 0.41* 0.35* 0.35*  CALCIUM   --    < > 7.7*  --  7.0* 7.4* 7.7* 7.6*  MG 2.2  --   --  1.7 2.2  --   --  2.1   < > = values in this interval not displayed.   Liver Function Tests: Recent Labs  Lab 12/29/23 0020  AST 29  ALT 13  ALKPHOS 118  BILITOT 0.8  PROT 6.7  ALBUMIN 2.0*   Recent Labs  Lab  12/29/23 0020  LIPASE 51   No results for input(s): "AMMONIA" in the last 168 hours. CBC: Recent Labs  Lab 12/29/23 0020 12/30/23 0409 12/31/23 0544 01/01/24 0209  WBC 8.2 8.5 7.5 7.8  NEUTROABS 6.7  --   --   --   HGB 12.2 9.7* 10.6* 9.9*  HCT 38.2 30.1* 32.2* 29.6*  MCV 100.5* 99.3 96.4 94.0  PLT 247 211 189 173   Cardiac Enzymes: No results for input(s): "CKTOTAL", "CKMB", "CKMBINDEX", "TROPONINI" in the last 168 hours. BNP: BNP (last 3 results) Recent Labs    09/29/23 1023 10/23/23 1144  BNP 18.3 22.3    ProBNP (last 3 results) No results for input(s): "PROBNP" in the last 8760 hours.  CBG: No results for input(s): "GLUCAP" in the last 168 hours.     Signed:  Raymonde Calico MD.  [facility] 01/02/2024, 10:10 AM

## 2024-01-02 NOTE — Progress Notes (Signed)
 Patient discharging home, all belongings sent with patient and mother. All discharge education and instructions provided to patient and mother, all questions answered. All IVs removed.

## 2024-01-02 NOTE — TOC Transition Note (Addendum)
 Transition of Care [facility]) - Discharge Note   Patient Details  Name: Abigail [name] [facility] MRN: [id number] Date of Birth: 01-Feb-1966  Transition of Care [facility]) CM/SW Contact:  Odilia Bennett, LCSW Phone Number: 01/02/2024, 11:12 AM   Clinical Narrative:  Patient has orders to discharge home today. She is still not interested in SNF placement and prefers to return home with home health. [facility] liaison. They can add OT, aide, and social work. DME recommendations for wheelchair and hospital bed but patient stated she already has those at home. Patient's mother will pick her up this afternoon. She stated that son listed in emergency contacts is actually patient's godson and his last name is Abigail [name]. CSW asked mom about DME. She said she doesn't have a hospital bed but she has a bed that she believes elevates the head and feet. Mom does not believe she received a wheelchair but [employer] confirmed they delivered one to her in December 2024 and have the signed delivery ticket. No further concerns. CSW signing off.   Final next level of care: Home w Home Health Services Barriers to Discharge: Barriers Resolved   Patient Goals and CMS Choice            Discharge Placement                Patient to be transferred to facility by: Mother Name of family member notified: Abigail [name] Patient and family notified of of transfer: 01/02/24  Discharge Plan and Services Additional resources added to the After Visit Summary for       Post Acute Care Choice:  (TBD)                    HH Arranged: PT HH Agency: [facility] Date [facility] Agency Contacted: 12/31/23   Representative spoke with at [facility] Agency: Verdis Glade  Social Drivers of Health (SDOH) Interventions SDOH Screenings   Food Insecurity: No Food Insecurity (12/29/2023)  Recent Concern: Food Insecurity - Food Insecurity Present (10/23/2023)  Housing: Low Risk  (12/29/2023)  Recent Concern: Housing - High Risk (10/23/2023)   Transportation Needs: No Transportation Needs (12/29/2023)  Recent Concern: Transportation Needs - Unmet Transportation Needs (10/23/2023)  Utilities: Not At Risk (12/29/2023)  Depression (PHQ2-9): High Risk (12/24/2023)  Social Connections: Unknown (12/29/2023)  Recent Concern: Social Connections - Socially Isolated (10/09/2023)  Tobacco Use: High Risk (12/28/2023)     Readmission Risk Interventions    12/31/2023    3:36 PM 09/26/2023    4:06 PM 07/31/2023    1:07 PM  Readmission Risk Prevention Plan  Transportation Screening  Complete Complete  Medication Review Oceanographer)  Complete Complete  PCP or Specialist appointment within 3-5 days of discharge Complete  Complete  HRI or Home Care Consult Complete  Complete  SW Recovery Care/Counseling Consult Complete Complete Complete  Palliative Care Screening Not Applicable Not Applicable Not Applicable  Skilled Nursing Facility Not Complete Not Applicable Not Applicable  SNF Comments Will follow up tomorrow after PT works with her again.

## 2024-01-02 NOTE — Progress Notes (Deleted)
 SATURATION QUALIFICATIONS: (This note is used to comply with regulatory documentation for home oxygen)  Patient Saturations on Room Air at Rest = 95%  Patient Saturations on Room Air while Ambulating = 91-92%

## 2024-01-03 LAB — CULTURE, BLOOD (SINGLE): Culture: NO GROWTH

## 2024-01-05 LAB — CULTURE, BLOOD (SINGLE): Culture: NO GROWTH

## 2024-01-06 ENCOUNTER — Ambulatory Visit: Payer: Self-pay | Admitting: Family Medicine

## 2024-01-06 ENCOUNTER — Telehealth: Payer: Self-pay | Admitting: Family Medicine

## 2024-01-06 NOTE — Telephone Encounter (Signed)
 Called patient to review her x-ray findings and LM on her voice mail asking her to call the office.

## 2024-01-07 ENCOUNTER — Encounter (INDEPENDENT_AMBULATORY_CARE_PROVIDER_SITE_OTHER): Payer: Self-pay

## 2024-01-15 DIAGNOSIS — Z599 Problem related to housing and economic circumstances, unspecified: Secondary | ICD-10-CM | POA: Diagnosis not present

## 2024-01-15 DIAGNOSIS — I2693 Single subsegmental pulmonary embolism without acute cor pulmonale: Secondary | ICD-10-CM | POA: Diagnosis not present

## 2024-01-16 DIAGNOSIS — J4489 Other specified chronic obstructive pulmonary disease: Secondary | ICD-10-CM | POA: Diagnosis not present

## 2024-01-16 DIAGNOSIS — M797 Fibromyalgia: Secondary | ICD-10-CM | POA: Diagnosis not present

## 2024-01-16 DIAGNOSIS — G8929 Other chronic pain: Secondary | ICD-10-CM | POA: Diagnosis not present

## 2024-01-16 DIAGNOSIS — I2510 Atherosclerotic heart disease of native coronary artery without angina pectoris: Secondary | ICD-10-CM | POA: Diagnosis not present

## 2024-01-16 DIAGNOSIS — E8809 Other disorders of plasma-protein metabolism, not elsewhere classified: Secondary | ICD-10-CM | POA: Diagnosis not present

## 2024-01-16 DIAGNOSIS — R6510 Systemic inflammatory response syndrome (SIRS) of non-infectious origin without acute organ dysfunction: Secondary | ICD-10-CM | POA: Diagnosis not present

## 2024-01-16 DIAGNOSIS — M4186 Other forms of scoliosis, lumbar region: Secondary | ICD-10-CM | POA: Diagnosis not present

## 2024-01-16 DIAGNOSIS — I959 Hypotension, unspecified: Secondary | ICD-10-CM | POA: Diagnosis not present

## 2024-01-16 DIAGNOSIS — K861 Other chronic pancreatitis: Secondary | ICD-10-CM | POA: Diagnosis not present

## 2024-01-16 DIAGNOSIS — G8192 Hemiplegia, unspecified affecting left dominant side: Secondary | ICD-10-CM | POA: Diagnosis not present

## 2024-01-16 DIAGNOSIS — E876 Hypokalemia: Secondary | ICD-10-CM | POA: Diagnosis not present

## 2024-01-16 DIAGNOSIS — K863 Pseudocyst of pancreas: Secondary | ICD-10-CM | POA: Diagnosis not present

## 2024-01-16 DIAGNOSIS — M1990 Unspecified osteoarthritis, unspecified site: Secondary | ICD-10-CM | POA: Diagnosis not present

## 2024-01-16 DIAGNOSIS — M316 Other giant cell arteritis: Secondary | ICD-10-CM | POA: Diagnosis not present

## 2024-01-16 DIAGNOSIS — M47812 Spondylosis without myelopathy or radiculopathy, cervical region: Secondary | ICD-10-CM | POA: Diagnosis not present

## 2024-01-16 DIAGNOSIS — M069 Rheumatoid arthritis, unspecified: Secondary | ICD-10-CM | POA: Diagnosis not present

## 2024-01-16 DIAGNOSIS — E871 Hypo-osmolality and hyponatremia: Secondary | ICD-10-CM | POA: Diagnosis not present

## 2024-01-16 DIAGNOSIS — J4520 Mild intermittent asthma, uncomplicated: Secondary | ICD-10-CM | POA: Diagnosis not present

## 2024-01-16 DIAGNOSIS — G9341 Metabolic encephalopathy: Secondary | ICD-10-CM | POA: Diagnosis not present

## 2024-01-16 DIAGNOSIS — J9601 Acute respiratory failure with hypoxia: Secondary | ICD-10-CM | POA: Diagnosis not present

## 2024-01-16 DIAGNOSIS — M5030 Other cervical disc degeneration, unspecified cervical region: Secondary | ICD-10-CM | POA: Diagnosis not present

## 2024-01-16 DIAGNOSIS — I700 Atherosclerosis of aorta: Secondary | ICD-10-CM | POA: Diagnosis not present

## 2024-01-16 DIAGNOSIS — J690 Pneumonitis due to inhalation of food and vomit: Secondary | ICD-10-CM | POA: Diagnosis not present

## 2024-01-16 DIAGNOSIS — I10 Essential (primary) hypertension: Secondary | ICD-10-CM | POA: Diagnosis not present

## 2024-01-17 ENCOUNTER — Encounter: Payer: Self-pay | Admitting: Family Medicine

## 2024-01-17 ENCOUNTER — Ambulatory Visit: Admitting: Family Medicine

## 2024-01-17 VITALS — BP 93/64 | HR 97 | Temp 97.9°F | Resp 17 | Ht 66.0 in | Wt 112.2 lb

## 2024-01-17 DIAGNOSIS — F321 Major depressive disorder, single episode, moderate: Secondary | ICD-10-CM

## 2024-01-17 DIAGNOSIS — R110 Nausea: Secondary | ICD-10-CM | POA: Diagnosis not present

## 2024-01-17 DIAGNOSIS — R64 Cachexia: Secondary | ICD-10-CM

## 2024-01-17 MED ORDER — DULOXETINE HCL 60 MG PO CPEP
60.0000 mg | ORAL_CAPSULE | Freq: Every day | ORAL | 2 refills | Status: AC
Start: 2024-01-17 — End: ?
  Filled 2024-01-29 (×2): qty 30, 30d supply, fill #0

## 2024-01-17 MED ORDER — MIRTAZAPINE 7.5 MG PO TABS
7.5000 mg | ORAL_TABLET | Freq: Every day | ORAL | 2 refills | Status: DC
Start: 2024-01-17 — End: 2024-02-11

## 2024-01-17 MED ORDER — ONDANSETRON HCL 4 MG PO TABS: 4.0000 mg | ORAL_TABLET | Freq: Three times a day (TID) | ORAL | 1 refills | Status: DC | PRN

## 2024-01-17 MED ORDER — MEGESTROL ACETATE 400 MG/10ML PO SUSP: 200.0000 mg | Freq: Every day | ORAL | 2 refills | Status: DC

## 2024-01-17 NOTE — Progress Notes (Signed)
 Established Patient Office Visit  Subjective   Patient ID: Abigail Abigail [name], female    DOB: [birth year]  Age: 58 y.o. MRN: [id number]  Chief Complaint  Patient presents with   Follow-up    Pt. Here for a Abigail [name] follow-up.   lack of appetite    Pt stated that she gets full really fast. Pt. C/o that when she eats she gets nauseated.    Medication Refill    Ned refill on Ondansetron ,     HPI Discussed the use of AI scribe software for clinical note transcription with the patient, who gave verbal consent to proceed.  History of Present Illness   Abigail Abigail [name] "Abigail Abigail [name]" is a 58 year old female with a history of seizures and pulmonary embolism who presents with recent seizure activity and medication management issues.  She experienced a seizure episode on the Saturday before Mother's Day. Her grandson found her disoriented, sitting on the bed with her feet on the floor, and she did not recognize him. EMS was called, and she was taken to the emergency room where she stayed until the following Tuesday. She has been experiencing 'seizure-like hallucination episodes' for some time, particularly since starting Tegretol . These episodes include seeing animals that are not present. She is currently on Keppra , Lamictal  and Tegretol  for seizure management.  She is scheduled for drug levels next week.  Her mother dispenses her medications and admits that her daughter has missed several doses this week.  For example, her mother does not want to wake her up to give her medications when they are due.    During her Abigail [name] stay, it was discovered that she had a blood clot in her left lung. She was treated with heparin  during her hospitalization and discharged on low dose Eliquis , 5mg  daily.  Her potassium levels were low, which delayed her discharge until they stabilized. She was sent home on potassium supplements, 10 mg once daily with food.   Her appetite has been poor, and she has lost  weight. She has not been taking mirtazapine  (Remeron ) regularly, which was prescribed to stimulate her appetite. There was confusion regarding the dosage, with one bottle indicating 7.5 mg and another 30 mg. She has also not been taking Megestrol, which is intended to increase her appetite. She is mostly immobile, spending most of her time in bed or a recliner and only gets up to use the bathroom. She has been on morphine  for pain management, which may suppress her appetite. She is not currently taking oxycodone .   She has a sore on the upper sacrum that has been present for a few weeks.  Her mother wonders if this is a bed sore.  She also has a wound on her lateral leg left that has been present for many weeks.  She does not want to go to the Wound Clinic.         ROS    Objective:     BP 93/64 (BP Location: Right Arm, Patient Position: Sitting, Cuff Size: Normal)   Pulse 97   Temp 97.9 F (36.6 C) (Oral)   Resp 17   Ht 5\' 6"  (1.676 m)   Wt 112 lb 3.2 oz (50.9 kg)   SpO2 90%   BMI 18.11 kg/m    Physical Exam Vitals and nursing note reviewed.  Constitutional:      Appearance: Normal appearance.     Comments: Kyphotic  HENT:     Head: Normocephalic and atraumatic.  Eyes:     Conjunctiva/sclera: Conjunctivae normal.  Cardiovascular:     Rate and Rhythm: Normal rate and regular rhythm.  Pulmonary:     Effort: Pulmonary effort is normal.     Breath sounds: Normal breath sounds.  Musculoskeletal:     Right lower leg: No edema.     Left lower leg: No edema.  Skin:    General: Skin is warm and dry.     Comments: 1.5cm round decubitus ulcer upper sacrum right.  Hyperemic granulation tissue.  No edema or surrounding erythema.  2.0 cm ulceration with necrotic tissue and crusting.  No surrounding erythema.  No drainage.  1-2 plus pitting edema.    Neurological:     Mental Status: She is alert and oriented to person, place, and time.  Psychiatric:        Mood and Affect: Mood  normal.        Behavior: Behavior normal.        Thought Content: Thought content normal.        Judgment: Judgment normal.          No results found for any visits on 01/17/24.    The 10-year ASCVD risk score (Arnett DK, et al., 2019) is: 6.5%    Assessment & Plan:  Cachexia (HCC) -     Megestrol Acetate; Take 5 mLs (200 mg total) by mouth daily.  Dispense: 150 mL; Refill: 2 -     Mirtazapine ; Take 1 tablet (7.5 mg total) by mouth at bedtime.  Dispense: 30 tablet; Refill: 2  Depression, major, single episode, moderate (HCC) -     DULoxetine  HCl; Take 1 capsule (60 mg total) by mouth daily.  Dispense: 30 capsule; Refill: 2  Nausea -     Ondansetron  HCl; Take 1 tablet (4 mg total) by mouth every 8 (eight) hours as needed for nausea or vomiting.  Dispense: 30 tablet; Refill: 1   Assessment and Plan    Seizure disorder Recurrent seizures managed with Keppra , Lamictal , and Tegretol . Hallucination-like episodes unlikely due to current medications. - Continue Keppra , Lamictal , and Tegretol .  Scheduled for drug levels next week but has missed several doses of her medications.    Pulmonary embolism Left lung pulmonary embolism treated with heparin  and Eliquis . Evaluated potential interaction with seizure medications. - Continue Eliquis  for anticoagulation.  Hypokalemia Recurrent hypokalemia managed with potassium supplementation. - Continue potassium supplementation, 10 mg daily with food.  Weight loss Significant weight loss with decreased appetite. Remeron  used to stimulate appetite. Considered Megestrol for appetite increase. - Continue Remeron  7.5 mg daily. - Start Megestrol to stimulate appetite. - Prescribe medication for nausea as requested.   Decubitus ulcer -Stage 2 and no sign of infection.   -Continue Vaseline coverage _Good nutrition   Stasis ulcer, right lateral leg -Insists that she wear compression stockings during the day.   -Unlikely that she can go  to the Wound Center at this time.  Will arrange for Home Health to check her wound.        Return in about 4 weeks (around 02/14/2024).    Ara Grandmaison K Yobany Vroom, MD

## 2024-01-20 ENCOUNTER — Telehealth: Payer: Self-pay

## 2024-01-20 NOTE — Telephone Encounter (Signed)
 Copied from CRM [id number]. Topic: General - Other >> Jan 17, 2024  3:42 PM Sophia H wrote: Reason for CRM: pt mother returning missed call, no notes in chart. Please reach back out if anyone from office called. Ty

## 2024-01-21 DIAGNOSIS — M069 Rheumatoid arthritis, unspecified: Secondary | ICD-10-CM | POA: Diagnosis not present

## 2024-01-21 DIAGNOSIS — E8809 Other disorders of plasma-protein metabolism, not elsewhere classified: Secondary | ICD-10-CM | POA: Diagnosis not present

## 2024-01-21 DIAGNOSIS — J4489 Other specified chronic obstructive pulmonary disease: Secondary | ICD-10-CM | POA: Diagnosis not present

## 2024-01-21 DIAGNOSIS — I959 Hypotension, unspecified: Secondary | ICD-10-CM | POA: Diagnosis not present

## 2024-01-21 DIAGNOSIS — K861 Other chronic pancreatitis: Secondary | ICD-10-CM | POA: Diagnosis not present

## 2024-01-21 DIAGNOSIS — E871 Hypo-osmolality and hyponatremia: Secondary | ICD-10-CM | POA: Diagnosis not present

## 2024-01-21 DIAGNOSIS — E876 Hypokalemia: Secondary | ICD-10-CM | POA: Diagnosis not present

## 2024-01-21 DIAGNOSIS — M797 Fibromyalgia: Secondary | ICD-10-CM | POA: Diagnosis not present

## 2024-01-21 DIAGNOSIS — J690 Pneumonitis due to inhalation of food and vomit: Secondary | ICD-10-CM | POA: Diagnosis not present

## 2024-01-21 DIAGNOSIS — J9601 Acute respiratory failure with hypoxia: Secondary | ICD-10-CM | POA: Diagnosis not present

## 2024-01-21 DIAGNOSIS — K863 Pseudocyst of pancreas: Secondary | ICD-10-CM | POA: Diagnosis not present

## 2024-01-21 DIAGNOSIS — M4186 Other forms of scoliosis, lumbar region: Secondary | ICD-10-CM | POA: Diagnosis not present

## 2024-01-21 DIAGNOSIS — I2510 Atherosclerotic heart disease of native coronary artery without angina pectoris: Secondary | ICD-10-CM | POA: Diagnosis not present

## 2024-01-21 DIAGNOSIS — M5030 Other cervical disc degeneration, unspecified cervical region: Secondary | ICD-10-CM | POA: Diagnosis not present

## 2024-01-21 DIAGNOSIS — J4520 Mild intermittent asthma, uncomplicated: Secondary | ICD-10-CM | POA: Diagnosis not present

## 2024-01-21 DIAGNOSIS — R6510 Systemic inflammatory response syndrome (SIRS) of non-infectious origin without acute organ dysfunction: Secondary | ICD-10-CM | POA: Diagnosis not present

## 2024-01-21 DIAGNOSIS — M316 Other giant cell arteritis: Secondary | ICD-10-CM | POA: Diagnosis not present

## 2024-01-21 DIAGNOSIS — I700 Atherosclerosis of aorta: Secondary | ICD-10-CM | POA: Diagnosis not present

## 2024-01-21 DIAGNOSIS — G8929 Other chronic pain: Secondary | ICD-10-CM | POA: Diagnosis not present

## 2024-01-21 DIAGNOSIS — I10 Essential (primary) hypertension: Secondary | ICD-10-CM | POA: Diagnosis not present

## 2024-01-21 DIAGNOSIS — G9341 Metabolic encephalopathy: Secondary | ICD-10-CM | POA: Diagnosis not present

## 2024-01-21 DIAGNOSIS — G8192 Hemiplegia, unspecified affecting left dominant side: Secondary | ICD-10-CM | POA: Diagnosis not present

## 2024-01-21 DIAGNOSIS — M1990 Unspecified osteoarthritis, unspecified site: Secondary | ICD-10-CM | POA: Diagnosis not present

## 2024-01-21 DIAGNOSIS — M47812 Spondylosis without myelopathy or radiculopathy, cervical region: Secondary | ICD-10-CM | POA: Diagnosis not present

## 2024-01-22 DIAGNOSIS — M810 Age-related osteoporosis without current pathological fracture: Secondary | ICD-10-CM | POA: Diagnosis not present

## 2024-01-22 DIAGNOSIS — R636 Underweight: Secondary | ICD-10-CM | POA: Diagnosis not present

## 2024-01-22 DIAGNOSIS — F17200 Nicotine dependence, unspecified, uncomplicated: Secondary | ICD-10-CM | POA: Diagnosis not present

## 2024-01-24 DIAGNOSIS — M069 Rheumatoid arthritis, unspecified: Secondary | ICD-10-CM | POA: Diagnosis not present

## 2024-01-24 DIAGNOSIS — I959 Hypotension, unspecified: Secondary | ICD-10-CM | POA: Diagnosis not present

## 2024-01-24 DIAGNOSIS — J9601 Acute respiratory failure with hypoxia: Secondary | ICD-10-CM | POA: Diagnosis not present

## 2024-01-24 DIAGNOSIS — M316 Other giant cell arteritis: Secondary | ICD-10-CM | POA: Diagnosis not present

## 2024-01-24 DIAGNOSIS — I2510 Atherosclerotic heart disease of native coronary artery without angina pectoris: Secondary | ICD-10-CM | POA: Diagnosis not present

## 2024-01-24 DIAGNOSIS — E871 Hypo-osmolality and hyponatremia: Secondary | ICD-10-CM | POA: Diagnosis not present

## 2024-01-24 DIAGNOSIS — M47812 Spondylosis without myelopathy or radiculopathy, cervical region: Secondary | ICD-10-CM | POA: Diagnosis not present

## 2024-01-24 DIAGNOSIS — E876 Hypokalemia: Secondary | ICD-10-CM | POA: Diagnosis not present

## 2024-01-24 DIAGNOSIS — G8192 Hemiplegia, unspecified affecting left dominant side: Secondary | ICD-10-CM | POA: Diagnosis not present

## 2024-01-24 DIAGNOSIS — M5030 Other cervical disc degeneration, unspecified cervical region: Secondary | ICD-10-CM | POA: Diagnosis not present

## 2024-01-24 DIAGNOSIS — J4489 Other specified chronic obstructive pulmonary disease: Secondary | ICD-10-CM | POA: Diagnosis not present

## 2024-01-24 DIAGNOSIS — I10 Essential (primary) hypertension: Secondary | ICD-10-CM | POA: Diagnosis not present

## 2024-01-24 DIAGNOSIS — G9341 Metabolic encephalopathy: Secondary | ICD-10-CM | POA: Diagnosis not present

## 2024-01-24 DIAGNOSIS — K861 Other chronic pancreatitis: Secondary | ICD-10-CM | POA: Diagnosis not present

## 2024-01-24 DIAGNOSIS — M1990 Unspecified osteoarthritis, unspecified site: Secondary | ICD-10-CM | POA: Diagnosis not present

## 2024-01-24 DIAGNOSIS — K863 Pseudocyst of pancreas: Secondary | ICD-10-CM | POA: Diagnosis not present

## 2024-01-24 DIAGNOSIS — G8929 Other chronic pain: Secondary | ICD-10-CM | POA: Diagnosis not present

## 2024-01-24 DIAGNOSIS — R6510 Systemic inflammatory response syndrome (SIRS) of non-infectious origin without acute organ dysfunction: Secondary | ICD-10-CM | POA: Diagnosis not present

## 2024-01-24 DIAGNOSIS — M4186 Other forms of scoliosis, lumbar region: Secondary | ICD-10-CM | POA: Diagnosis not present

## 2024-01-24 DIAGNOSIS — E8809 Other disorders of plasma-protein metabolism, not elsewhere classified: Secondary | ICD-10-CM | POA: Diagnosis not present

## 2024-01-24 DIAGNOSIS — M797 Fibromyalgia: Secondary | ICD-10-CM | POA: Diagnosis not present

## 2024-01-24 DIAGNOSIS — J4520 Mild intermittent asthma, uncomplicated: Secondary | ICD-10-CM | POA: Diagnosis not present

## 2024-01-24 DIAGNOSIS — J690 Pneumonitis due to inhalation of food and vomit: Secondary | ICD-10-CM | POA: Diagnosis not present

## 2024-01-24 DIAGNOSIS — I700 Atherosclerosis of aorta: Secondary | ICD-10-CM | POA: Diagnosis not present

## 2024-01-28 ENCOUNTER — Other Ambulatory Visit: Payer: Self-pay

## 2024-01-29 ENCOUNTER — Other Ambulatory Visit: Payer: Self-pay

## 2024-01-30 DIAGNOSIS — E871 Hypo-osmolality and hyponatremia: Secondary | ICD-10-CM | POA: Diagnosis not present

## 2024-01-30 DIAGNOSIS — M069 Rheumatoid arthritis, unspecified: Secondary | ICD-10-CM | POA: Diagnosis not present

## 2024-01-30 DIAGNOSIS — M797 Fibromyalgia: Secondary | ICD-10-CM | POA: Diagnosis not present

## 2024-01-30 DIAGNOSIS — J9601 Acute respiratory failure with hypoxia: Secondary | ICD-10-CM | POA: Diagnosis not present

## 2024-01-30 DIAGNOSIS — G9341 Metabolic encephalopathy: Secondary | ICD-10-CM | POA: Diagnosis not present

## 2024-01-30 DIAGNOSIS — J4489 Other specified chronic obstructive pulmonary disease: Secondary | ICD-10-CM | POA: Diagnosis not present

## 2024-01-30 DIAGNOSIS — J4520 Mild intermittent asthma, uncomplicated: Secondary | ICD-10-CM | POA: Diagnosis not present

## 2024-01-30 DIAGNOSIS — M316 Other giant cell arteritis: Secondary | ICD-10-CM | POA: Diagnosis not present

## 2024-01-30 DIAGNOSIS — J690 Pneumonitis due to inhalation of food and vomit: Secondary | ICD-10-CM | POA: Diagnosis not present

## 2024-01-30 DIAGNOSIS — R6510 Systemic inflammatory response syndrome (SIRS) of non-infectious origin without acute organ dysfunction: Secondary | ICD-10-CM | POA: Diagnosis not present

## 2024-01-30 DIAGNOSIS — M47812 Spondylosis without myelopathy or radiculopathy, cervical region: Secondary | ICD-10-CM | POA: Diagnosis not present

## 2024-01-30 DIAGNOSIS — E876 Hypokalemia: Secondary | ICD-10-CM | POA: Diagnosis not present

## 2024-01-30 DIAGNOSIS — M5030 Other cervical disc degeneration, unspecified cervical region: Secondary | ICD-10-CM | POA: Diagnosis not present

## 2024-01-30 DIAGNOSIS — I2510 Atherosclerotic heart disease of native coronary artery without angina pectoris: Secondary | ICD-10-CM | POA: Diagnosis not present

## 2024-01-30 DIAGNOSIS — G8929 Other chronic pain: Secondary | ICD-10-CM | POA: Diagnosis not present

## 2024-01-30 DIAGNOSIS — G8192 Hemiplegia, unspecified affecting left dominant side: Secondary | ICD-10-CM | POA: Diagnosis not present

## 2024-01-30 DIAGNOSIS — K863 Pseudocyst of pancreas: Secondary | ICD-10-CM | POA: Diagnosis not present

## 2024-01-30 DIAGNOSIS — E8809 Other disorders of plasma-protein metabolism, not elsewhere classified: Secondary | ICD-10-CM | POA: Diagnosis not present

## 2024-01-30 DIAGNOSIS — K861 Other chronic pancreatitis: Secondary | ICD-10-CM | POA: Diagnosis not present

## 2024-01-30 DIAGNOSIS — I700 Atherosclerosis of aorta: Secondary | ICD-10-CM | POA: Diagnosis not present

## 2024-01-30 DIAGNOSIS — M1990 Unspecified osteoarthritis, unspecified site: Secondary | ICD-10-CM | POA: Diagnosis not present

## 2024-01-30 DIAGNOSIS — M4186 Other forms of scoliosis, lumbar region: Secondary | ICD-10-CM | POA: Diagnosis not present

## 2024-01-30 DIAGNOSIS — I959 Hypotension, unspecified: Secondary | ICD-10-CM | POA: Diagnosis not present

## 2024-01-30 DIAGNOSIS — I10 Essential (primary) hypertension: Secondary | ICD-10-CM | POA: Diagnosis not present

## 2024-01-31 DIAGNOSIS — M5030 Other cervical disc degeneration, unspecified cervical region: Secondary | ICD-10-CM | POA: Diagnosis not present

## 2024-01-31 DIAGNOSIS — M797 Fibromyalgia: Secondary | ICD-10-CM | POA: Diagnosis not present

## 2024-01-31 DIAGNOSIS — G8929 Other chronic pain: Secondary | ICD-10-CM | POA: Diagnosis not present

## 2024-01-31 DIAGNOSIS — M47812 Spondylosis without myelopathy or radiculopathy, cervical region: Secondary | ICD-10-CM | POA: Diagnosis not present

## 2024-01-31 DIAGNOSIS — M069 Rheumatoid arthritis, unspecified: Secondary | ICD-10-CM | POA: Diagnosis not present

## 2024-01-31 DIAGNOSIS — M316 Other giant cell arteritis: Secondary | ICD-10-CM | POA: Diagnosis not present

## 2024-01-31 DIAGNOSIS — G9341 Metabolic encephalopathy: Secondary | ICD-10-CM | POA: Diagnosis not present

## 2024-01-31 DIAGNOSIS — E8809 Other disorders of plasma-protein metabolism, not elsewhere classified: Secondary | ICD-10-CM | POA: Diagnosis not present

## 2024-01-31 DIAGNOSIS — J4489 Other specified chronic obstructive pulmonary disease: Secondary | ICD-10-CM | POA: Diagnosis not present

## 2024-01-31 DIAGNOSIS — E876 Hypokalemia: Secondary | ICD-10-CM | POA: Diagnosis not present

## 2024-01-31 DIAGNOSIS — K863 Pseudocyst of pancreas: Secondary | ICD-10-CM | POA: Diagnosis not present

## 2024-01-31 DIAGNOSIS — J4520 Mild intermittent asthma, uncomplicated: Secondary | ICD-10-CM | POA: Diagnosis not present

## 2024-01-31 DIAGNOSIS — I959 Hypotension, unspecified: Secondary | ICD-10-CM | POA: Diagnosis not present

## 2024-01-31 DIAGNOSIS — M4186 Other forms of scoliosis, lumbar region: Secondary | ICD-10-CM | POA: Diagnosis not present

## 2024-01-31 DIAGNOSIS — G8192 Hemiplegia, unspecified affecting left dominant side: Secondary | ICD-10-CM | POA: Diagnosis not present

## 2024-01-31 DIAGNOSIS — J690 Pneumonitis due to inhalation of food and vomit: Secondary | ICD-10-CM | POA: Diagnosis not present

## 2024-01-31 DIAGNOSIS — K861 Other chronic pancreatitis: Secondary | ICD-10-CM | POA: Diagnosis not present

## 2024-01-31 DIAGNOSIS — R6510 Systemic inflammatory response syndrome (SIRS) of non-infectious origin without acute organ dysfunction: Secondary | ICD-10-CM | POA: Diagnosis not present

## 2024-01-31 DIAGNOSIS — E871 Hypo-osmolality and hyponatremia: Secondary | ICD-10-CM | POA: Diagnosis not present

## 2024-01-31 DIAGNOSIS — I700 Atherosclerosis of aorta: Secondary | ICD-10-CM | POA: Diagnosis not present

## 2024-01-31 DIAGNOSIS — I10 Essential (primary) hypertension: Secondary | ICD-10-CM | POA: Diagnosis not present

## 2024-01-31 DIAGNOSIS — J9601 Acute respiratory failure with hypoxia: Secondary | ICD-10-CM | POA: Diagnosis not present

## 2024-01-31 DIAGNOSIS — M1990 Unspecified osteoarthritis, unspecified site: Secondary | ICD-10-CM | POA: Diagnosis not present

## 2024-01-31 DIAGNOSIS — I2510 Atherosclerotic heart disease of native coronary artery without angina pectoris: Secondary | ICD-10-CM | POA: Diagnosis not present

## 2024-02-03 ENCOUNTER — Ambulatory Visit: Payer: Self-pay | Admitting: *Deleted

## 2024-02-03 DIAGNOSIS — K861 Other chronic pancreatitis: Secondary | ICD-10-CM | POA: Diagnosis not present

## 2024-02-03 DIAGNOSIS — I10 Essential (primary) hypertension: Secondary | ICD-10-CM | POA: Diagnosis not present

## 2024-02-03 DIAGNOSIS — J9601 Acute respiratory failure with hypoxia: Secondary | ICD-10-CM | POA: Diagnosis not present

## 2024-02-03 DIAGNOSIS — J4520 Mild intermittent asthma, uncomplicated: Secondary | ICD-10-CM | POA: Diagnosis not present

## 2024-02-03 DIAGNOSIS — G9341 Metabolic encephalopathy: Secondary | ICD-10-CM | POA: Diagnosis not present

## 2024-02-03 DIAGNOSIS — M5030 Other cervical disc degeneration, unspecified cervical region: Secondary | ICD-10-CM | POA: Diagnosis not present

## 2024-02-03 DIAGNOSIS — G8929 Other chronic pain: Secondary | ICD-10-CM | POA: Diagnosis not present

## 2024-02-03 DIAGNOSIS — K863 Pseudocyst of pancreas: Secondary | ICD-10-CM | POA: Diagnosis not present

## 2024-02-03 DIAGNOSIS — J4489 Other specified chronic obstructive pulmonary disease: Secondary | ICD-10-CM | POA: Diagnosis not present

## 2024-02-03 DIAGNOSIS — M069 Rheumatoid arthritis, unspecified: Secondary | ICD-10-CM | POA: Diagnosis not present

## 2024-02-03 DIAGNOSIS — M316 Other giant cell arteritis: Secondary | ICD-10-CM | POA: Diagnosis not present

## 2024-02-03 DIAGNOSIS — M47812 Spondylosis without myelopathy or radiculopathy, cervical region: Secondary | ICD-10-CM | POA: Diagnosis not present

## 2024-02-03 DIAGNOSIS — G8192 Hemiplegia, unspecified affecting left dominant side: Secondary | ICD-10-CM | POA: Diagnosis not present

## 2024-02-03 DIAGNOSIS — I700 Atherosclerosis of aorta: Secondary | ICD-10-CM | POA: Diagnosis not present

## 2024-02-03 DIAGNOSIS — E8809 Other disorders of plasma-protein metabolism, not elsewhere classified: Secondary | ICD-10-CM | POA: Diagnosis not present

## 2024-02-03 DIAGNOSIS — J690 Pneumonitis due to inhalation of food and vomit: Secondary | ICD-10-CM | POA: Diagnosis not present

## 2024-02-03 DIAGNOSIS — I2510 Atherosclerotic heart disease of native coronary artery without angina pectoris: Secondary | ICD-10-CM | POA: Diagnosis not present

## 2024-02-03 DIAGNOSIS — E871 Hypo-osmolality and hyponatremia: Secondary | ICD-10-CM | POA: Diagnosis not present

## 2024-02-03 DIAGNOSIS — M4186 Other forms of scoliosis, lumbar region: Secondary | ICD-10-CM | POA: Diagnosis not present

## 2024-02-03 DIAGNOSIS — M1990 Unspecified osteoarthritis, unspecified site: Secondary | ICD-10-CM | POA: Diagnosis not present

## 2024-02-03 DIAGNOSIS — M797 Fibromyalgia: Secondary | ICD-10-CM | POA: Diagnosis not present

## 2024-02-03 DIAGNOSIS — R6510 Systemic inflammatory response syndrome (SIRS) of non-infectious origin without acute organ dysfunction: Secondary | ICD-10-CM | POA: Diagnosis not present

## 2024-02-03 DIAGNOSIS — I959 Hypotension, unspecified: Secondary | ICD-10-CM | POA: Diagnosis not present

## 2024-02-03 DIAGNOSIS — E876 Hypokalemia: Secondary | ICD-10-CM | POA: Diagnosis not present

## 2024-02-03 NOTE — Telephone Encounter (Signed)
 Home health nurse called back after call dropping from Nurse triage. Reviewed with nurse and patient the recommendation of the initial nurse. No further complaints.

## 2024-02-03 NOTE — Telephone Encounter (Signed)
                 FYI Only or Action Required?: Action required by provider  Patient was last seen in primary care on 01/17/2024 by [name], Susan K, MD. Called Nurse Triage reporting Fall. Symptoms began several days ago. Interventions attempted: Nothing. Symptoms are: unchanged.  Triage Disposition: Go to ED Now (Notify PCP)  Patient/caregiver understands and will follow disposition?: No, wishes to speak with PCP       Copied from CRM [id number]. Topic: Clinical - Red Word Triage >> Feb 03, 2024  2:39 PM Sasha H wrote: Red Word that prompted transfer to Nurse Triage: [facility] is currently with the pt and called in to report that the pt fell over the fell over the weekend and has left groin and right shoulder pain from it. Reason for Disposition  Sounds like a serious injury to the triager  Answer Assessment - Initial Assessment Questions 1. MECHANISM: How did the fall happen?     Tried to put up baby gate and loss balance and fell hitting right side of head , right shoulder  2. DOMESTIC VIOLENCE AND ELDER ABUSE SCREENING: Did you fall because someone pushed you or tried to hurt you? If Yes, ask: Are you safe now?     Na  3. ONSET: When did the fall happen? (e.g., minutes, hours, or days ago)     Over weekend  4. LOCATION: What part of the body hit the ground? (e.g., back, buttocks, head, hips, knees, hands, head, stomach)     Right side of head hit grandfather clock. Right shoulder 5. INJURY: Did you hurt (injure) yourself when you fell? If Yes, ask: What did you injure? Tell me more about this? (e.g., body area; type of injury; pain severity)     Yes pain right side of head and right shoulder now left groin pain 6. PAIN: Is there any pain? If Yes, ask: How bad is the pain? (e.g., Scale 1-10; or mild,  moderate, severe)   - NONE (0): No pain   - MILD (1-3): Doesn't interfere with normal activities    - MODERATE (4-7): Interferes with normal  activities or awakens from sleep    - SEVERE (8-10): Excruciating pain, unable to do any normal activities      Left groin pain 7/10  7. SIZE: For cuts, bruises, or swelling, ask: How large is it? (e.g., inches or centimeters)      Right side of head knot 8. PREGNANCY: Is there any chance you are pregnant? When was your last menstrual period?     Na  9. OTHER SYMPTOMS: Do you have any other symptoms? (e.g., dizziness, fever, weakness; new onset or worsening).      Right shoulder , left groin pain, right knot on right side of head at eye area.  Denies dizziness no headache no N/V.  10. CAUSE: What do you think caused the fall (or falling)? (e.g., tripped, dizzy spell)       Loss of balance.    Recommended ED for evaluation due to fall and taking eliquis  . Patient requesting to see PCP. Please advise. Call disconnected and NT called patient back no answer, LVMTCB. Please advise if OV acceptable patient does not want to go to ED. CAL notified patient declined ED at this time.  Protocols used: Falls and [facility]

## 2024-02-03 NOTE — Telephone Encounter (Unsigned)
 Copied from CRM [id number]. Topic: Clinical - Medical Advice >> Feb 03, 2024  2:38 PM Sasha H wrote: Reason for CRM: [facility] called in to inform pt's pcp that pt had a fall over the weekend and has shoulder and groin pain

## 2024-02-06 DIAGNOSIS — J9601 Acute respiratory failure with hypoxia: Secondary | ICD-10-CM | POA: Diagnosis not present

## 2024-02-06 DIAGNOSIS — I2693 Single subsegmental pulmonary embolism without acute cor pulmonale: Secondary | ICD-10-CM | POA: Diagnosis not present

## 2024-02-06 DIAGNOSIS — J4520 Mild intermittent asthma, uncomplicated: Secondary | ICD-10-CM | POA: Diagnosis not present

## 2024-02-06 DIAGNOSIS — M5416 Radiculopathy, lumbar region: Secondary | ICD-10-CM | POA: Diagnosis not present

## 2024-02-06 DIAGNOSIS — M4186 Other forms of scoliosis, lumbar region: Secondary | ICD-10-CM | POA: Diagnosis not present

## 2024-02-06 DIAGNOSIS — M797 Fibromyalgia: Secondary | ICD-10-CM | POA: Diagnosis not present

## 2024-02-06 DIAGNOSIS — I509 Heart failure, unspecified: Secondary | ICD-10-CM | POA: Diagnosis not present

## 2024-02-06 DIAGNOSIS — K861 Other chronic pancreatitis: Secondary | ICD-10-CM | POA: Diagnosis not present

## 2024-02-06 DIAGNOSIS — M069 Rheumatoid arthritis, unspecified: Secondary | ICD-10-CM | POA: Diagnosis not present

## 2024-02-06 DIAGNOSIS — I700 Atherosclerosis of aorta: Secondary | ICD-10-CM | POA: Diagnosis not present

## 2024-02-06 DIAGNOSIS — M48062 Spinal stenosis, lumbar region with neurogenic claudication: Secondary | ICD-10-CM | POA: Diagnosis not present

## 2024-02-06 DIAGNOSIS — R627 Adult failure to thrive: Secondary | ICD-10-CM | POA: Diagnosis not present

## 2024-02-06 DIAGNOSIS — I959 Hypotension, unspecified: Secondary | ICD-10-CM | POA: Diagnosis not present

## 2024-02-06 DIAGNOSIS — M1990 Unspecified osteoarthritis, unspecified site: Secondary | ICD-10-CM | POA: Diagnosis not present

## 2024-02-06 DIAGNOSIS — K760 Fatty (change of) liver, not elsewhere classified: Secondary | ICD-10-CM | POA: Diagnosis not present

## 2024-02-06 DIAGNOSIS — M064 Inflammatory polyarthropathy: Secondary | ICD-10-CM | POA: Diagnosis not present

## 2024-02-06 DIAGNOSIS — I2510 Atherosclerotic heart disease of native coronary artery without angina pectoris: Secondary | ICD-10-CM | POA: Diagnosis not present

## 2024-02-06 DIAGNOSIS — J4489 Other specified chronic obstructive pulmonary disease: Secondary | ICD-10-CM | POA: Diagnosis not present

## 2024-02-06 DIAGNOSIS — M961 Postlaminectomy syndrome, not elsewhere classified: Secondary | ICD-10-CM | POA: Diagnosis not present

## 2024-02-06 DIAGNOSIS — G8929 Other chronic pain: Secondary | ICD-10-CM | POA: Diagnosis not present

## 2024-02-06 DIAGNOSIS — G8192 Hemiplegia, unspecified affecting left dominant side: Secondary | ICD-10-CM | POA: Diagnosis not present

## 2024-02-06 DIAGNOSIS — Z79891 Long term (current) use of opiate analgesic: Secondary | ICD-10-CM | POA: Diagnosis not present

## 2024-02-06 DIAGNOSIS — K863 Pseudocyst of pancreas: Secondary | ICD-10-CM | POA: Diagnosis not present

## 2024-02-06 DIAGNOSIS — E8809 Other disorders of plasma-protein metabolism, not elsewhere classified: Secondary | ICD-10-CM | POA: Diagnosis not present

## 2024-02-06 DIAGNOSIS — M47812 Spondylosis without myelopathy or radiculopathy, cervical region: Secondary | ICD-10-CM | POA: Diagnosis not present

## 2024-02-06 DIAGNOSIS — I864 Gastric varices: Secondary | ICD-10-CM | POA: Diagnosis not present

## 2024-02-06 DIAGNOSIS — I110 Hypertensive heart disease with heart failure: Secondary | ICD-10-CM | POA: Diagnosis not present

## 2024-02-06 DIAGNOSIS — M5030 Other cervical disc degeneration, unspecified cervical region: Secondary | ICD-10-CM | POA: Diagnosis not present

## 2024-02-06 DIAGNOSIS — M316 Other giant cell arteritis: Secondary | ICD-10-CM | POA: Diagnosis not present

## 2024-02-06 DIAGNOSIS — J9622 Acute and chronic respiratory failure with hypercapnia: Secondary | ICD-10-CM | POA: Diagnosis not present

## 2024-02-09 ENCOUNTER — Other Ambulatory Visit: Payer: Self-pay | Admitting: Family Medicine

## 2024-02-09 DIAGNOSIS — R64 Cachexia: Secondary | ICD-10-CM

## 2024-02-11 ENCOUNTER — Ambulatory Visit (INDEPENDENT_AMBULATORY_CARE_PROVIDER_SITE_OTHER): Admitting: Family Medicine

## 2024-02-11 ENCOUNTER — Encounter: Payer: Self-pay | Admitting: Family Medicine

## 2024-02-11 VITALS — BP 115/78 | HR 95 | Temp 97.7°F | Resp 18 | Ht 66.0 in | Wt 121.0 lb

## 2024-02-11 DIAGNOSIS — M25552 Pain in left hip: Secondary | ICD-10-CM | POA: Diagnosis not present

## 2024-02-11 DIAGNOSIS — R64 Cachexia: Secondary | ICD-10-CM | POA: Insufficient documentation

## 2024-02-11 DIAGNOSIS — M25559 Pain in unspecified hip: Secondary | ICD-10-CM | POA: Insufficient documentation

## 2024-02-11 MED ORDER — MEGESTROL ACETATE 400 MG/10ML PO SUSP: 400.0000 mg | Freq: Every day | ORAL | 2 refills | Status: DC

## 2024-02-11 MED ORDER — MIRTAZAPINE 7.5 MG PO TABS
7.5000 mg | ORAL_TABLET | Freq: Every day | ORAL | 2 refills | Status: DC
Start: 2024-02-11 — End: 2024-05-06

## 2024-02-11 NOTE — Assessment & Plan Note (Signed)
 Ask her to get films of her left hip so we can rule out fracture.  Her orders have been sent to [facility].

## 2024-02-11 NOTE — Assessment & Plan Note (Signed)
 Increase her Megace  to 2 teaspoons or 400 mg daily.  Please continue Remeron  7.5 mg nightly.  Please do not skip meals.

## 2024-02-11 NOTE — Progress Notes (Signed)
 Established Patient Office Visit  Subjective   Patient ID: Abigail [name], female    DOB: 1966/07/11  Age: 58 y.o. MRN: [id number]  No chief complaint on file.   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   Her left hip hurts.  She has pain with walking and standing but also pain at rest.  She fell last week and landed on her left hip.  She did not go to the ED.  She does agree to get x-rays today. She has cachexia and her weight today is 121 pounds which is up 9 pounds from 01/17/2024.  She feels like a lot of the weight gain is fluid in her legs.  She has not been wearing her compression socks to the knee.  She has Megace  400 mg per 10 mL and she has not been taking this.  She does take Remeron  7.5 mg nightly and reports that she is eating better and she craves food now. She is scheduled for a bone density this Thursday.   She reports that she is not seeing very well and having difficulty reading.  Ask her to see an optometrist to start with and see if this is a visual acuity problem or something else.      ROS    Objective:     BP 115/78 (BP Location: Left Arm, Patient Position: Sitting, Cuff Size: Normal)   Pulse 95   Temp 97.7 F (36.5 C) (Oral)   Resp 18   Ht 5' 6 (1.676 m)   Wt 121 lb (54.9 kg)   SpO2 94%   BMI 19.53 kg/m    Physical Exam Vitals and nursing note reviewed.  Constitutional:      Appearance: Normal appearance.  HENT:     Head: Normocephalic and atraumatic.    Eyes:     Conjunctiva/sclera: Conjunctivae normal.    Cardiovascular:     Rate and Rhythm: Normal rate and regular rhythm.  Pulmonary:     Effort: Pulmonary effort is normal.     Breath sounds: Normal breath sounds.   Musculoskeletal:     Right lower leg: No edema.     Left lower leg: No edema.   Skin:    General: Skin is warm and dry.   Neurological:     Mental Status: She is alert and oriented to person, place, and time.     Comments: No pain with internal and external rotation of the left hip.  Abduction and adduction are normal.  Psychiatric:        Mood and Affect: Mood normal.        Behavior: Behavior normal.        Thought Content: Thought content normal.        Judgment: Judgment normal.          No results found for any visits on 02/11/24.    The 10-year ASCVD risk score (Abigail [name], et al., 2019) is: 9.8%    Assessment & Plan:  Pain of left hip Assessment & Plan: Ask her to get films of her left hip  so we can rule out fracture.  Her orders have been sent to Abigail [name].  Orders: -     DG HIP UNILAT W OR W/O PELVIS 2-3 VIEWS LEFT; Future  Cachexia (HCC) Assessment & Plan: Increase her Megace  to 2 teaspoons or 400 mg daily.  Please continue Remeron  7.5 mg nightly.  Please do not skip meals.  Orders: -     Megestrol  Acetate; Take 10 mLs (400 mg total) by mouth daily.  Dispense: 300 mL; Refill: 2 -     Mirtazapine ; Take 1 tablet (7.5 mg total) by mouth at bedtime.  Dispense: 30 tablet; Refill: 2     Return in about 4 weeks (around 03/10/2024).    Travian Kerner K Felton Buczynski, MD

## 2024-02-12 DIAGNOSIS — I959 Hypotension, unspecified: Secondary | ICD-10-CM | POA: Diagnosis not present

## 2024-02-12 DIAGNOSIS — M069 Rheumatoid arthritis, unspecified: Secondary | ICD-10-CM | POA: Diagnosis not present

## 2024-02-12 DIAGNOSIS — I864 Gastric varices: Secondary | ICD-10-CM | POA: Diagnosis not present

## 2024-02-12 DIAGNOSIS — G8192 Hemiplegia, unspecified affecting left dominant side: Secondary | ICD-10-CM | POA: Diagnosis not present

## 2024-02-12 DIAGNOSIS — K863 Pseudocyst of pancreas: Secondary | ICD-10-CM | POA: Diagnosis not present

## 2024-02-12 DIAGNOSIS — E8809 Other disorders of plasma-protein metabolism, not elsewhere classified: Secondary | ICD-10-CM | POA: Diagnosis not present

## 2024-02-12 DIAGNOSIS — M47812 Spondylosis without myelopathy or radiculopathy, cervical region: Secondary | ICD-10-CM | POA: Diagnosis not present

## 2024-02-12 DIAGNOSIS — M4186 Other forms of scoliosis, lumbar region: Secondary | ICD-10-CM | POA: Diagnosis not present

## 2024-02-12 DIAGNOSIS — K760 Fatty (change of) liver, not elsewhere classified: Secondary | ICD-10-CM | POA: Diagnosis not present

## 2024-02-12 DIAGNOSIS — G8929 Other chronic pain: Secondary | ICD-10-CM | POA: Diagnosis not present

## 2024-02-12 DIAGNOSIS — I509 Heart failure, unspecified: Secondary | ICD-10-CM | POA: Diagnosis not present

## 2024-02-12 DIAGNOSIS — J4520 Mild intermittent asthma, uncomplicated: Secondary | ICD-10-CM | POA: Diagnosis not present

## 2024-02-12 DIAGNOSIS — K861 Other chronic pancreatitis: Secondary | ICD-10-CM | POA: Diagnosis not present

## 2024-02-12 DIAGNOSIS — I700 Atherosclerosis of aorta: Secondary | ICD-10-CM | POA: Diagnosis not present

## 2024-02-12 DIAGNOSIS — I2693 Single subsegmental pulmonary embolism without acute cor pulmonale: Secondary | ICD-10-CM | POA: Diagnosis not present

## 2024-02-12 DIAGNOSIS — M1990 Unspecified osteoarthritis, unspecified site: Secondary | ICD-10-CM | POA: Diagnosis not present

## 2024-02-12 DIAGNOSIS — M316 Other giant cell arteritis: Secondary | ICD-10-CM | POA: Diagnosis not present

## 2024-02-12 DIAGNOSIS — J4489 Other specified chronic obstructive pulmonary disease: Secondary | ICD-10-CM | POA: Diagnosis not present

## 2024-02-12 DIAGNOSIS — I110 Hypertensive heart disease with heart failure: Secondary | ICD-10-CM | POA: Diagnosis not present

## 2024-02-12 DIAGNOSIS — J9622 Acute and chronic respiratory failure with hypercapnia: Secondary | ICD-10-CM | POA: Diagnosis not present

## 2024-02-12 DIAGNOSIS — M5030 Other cervical disc degeneration, unspecified cervical region: Secondary | ICD-10-CM | POA: Diagnosis not present

## 2024-02-12 DIAGNOSIS — I2510 Atherosclerotic heart disease of native coronary artery without angina pectoris: Secondary | ICD-10-CM | POA: Diagnosis not present

## 2024-02-12 DIAGNOSIS — R627 Adult failure to thrive: Secondary | ICD-10-CM | POA: Diagnosis not present

## 2024-02-12 DIAGNOSIS — J9601 Acute respiratory failure with hypoxia: Secondary | ICD-10-CM | POA: Diagnosis not present

## 2024-02-13 ENCOUNTER — Other Ambulatory Visit: Payer: Self-pay

## 2024-02-13 DIAGNOSIS — M8588 Other specified disorders of bone density and structure, other site: Secondary | ICD-10-CM | POA: Diagnosis not present

## 2024-02-14 DIAGNOSIS — J4520 Mild intermittent asthma, uncomplicated: Secondary | ICD-10-CM | POA: Diagnosis not present

## 2024-02-14 DIAGNOSIS — M316 Other giant cell arteritis: Secondary | ICD-10-CM | POA: Diagnosis not present

## 2024-02-14 DIAGNOSIS — K861 Other chronic pancreatitis: Secondary | ICD-10-CM | POA: Diagnosis not present

## 2024-02-14 DIAGNOSIS — R627 Adult failure to thrive: Secondary | ICD-10-CM | POA: Diagnosis not present

## 2024-02-14 DIAGNOSIS — I2693 Single subsegmental pulmonary embolism without acute cor pulmonale: Secondary | ICD-10-CM | POA: Diagnosis not present

## 2024-02-14 DIAGNOSIS — G8192 Hemiplegia, unspecified affecting left dominant side: Secondary | ICD-10-CM | POA: Diagnosis not present

## 2024-02-14 DIAGNOSIS — M4186 Other forms of scoliosis, lumbar region: Secondary | ICD-10-CM | POA: Diagnosis not present

## 2024-02-14 DIAGNOSIS — I509 Heart failure, unspecified: Secondary | ICD-10-CM | POA: Diagnosis not present

## 2024-02-14 DIAGNOSIS — J9622 Acute and chronic respiratory failure with hypercapnia: Secondary | ICD-10-CM | POA: Diagnosis not present

## 2024-02-14 DIAGNOSIS — I110 Hypertensive heart disease with heart failure: Secondary | ICD-10-CM | POA: Diagnosis not present

## 2024-02-14 DIAGNOSIS — K760 Fatty (change of) liver, not elsewhere classified: Secondary | ICD-10-CM | POA: Diagnosis not present

## 2024-02-14 DIAGNOSIS — J4489 Other specified chronic obstructive pulmonary disease: Secondary | ICD-10-CM | POA: Diagnosis not present

## 2024-02-14 DIAGNOSIS — J9601 Acute respiratory failure with hypoxia: Secondary | ICD-10-CM | POA: Diagnosis not present

## 2024-02-14 DIAGNOSIS — M069 Rheumatoid arthritis, unspecified: Secondary | ICD-10-CM | POA: Diagnosis not present

## 2024-02-14 DIAGNOSIS — K863 Pseudocyst of pancreas: Secondary | ICD-10-CM | POA: Diagnosis not present

## 2024-02-14 DIAGNOSIS — I864 Gastric varices: Secondary | ICD-10-CM | POA: Diagnosis not present

## 2024-02-14 DIAGNOSIS — I2510 Atherosclerotic heart disease of native coronary artery without angina pectoris: Secondary | ICD-10-CM | POA: Diagnosis not present

## 2024-02-14 DIAGNOSIS — E8809 Other disorders of plasma-protein metabolism, not elsewhere classified: Secondary | ICD-10-CM | POA: Diagnosis not present

## 2024-02-14 DIAGNOSIS — I959 Hypotension, unspecified: Secondary | ICD-10-CM | POA: Diagnosis not present

## 2024-02-14 DIAGNOSIS — M47812 Spondylosis without myelopathy or radiculopathy, cervical region: Secondary | ICD-10-CM | POA: Diagnosis not present

## 2024-02-14 DIAGNOSIS — I700 Atherosclerosis of aorta: Secondary | ICD-10-CM | POA: Diagnosis not present

## 2024-02-14 DIAGNOSIS — M1990 Unspecified osteoarthritis, unspecified site: Secondary | ICD-10-CM | POA: Diagnosis not present

## 2024-02-14 DIAGNOSIS — G8929 Other chronic pain: Secondary | ICD-10-CM | POA: Diagnosis not present

## 2024-02-14 DIAGNOSIS — M5030 Other cervical disc degeneration, unspecified cervical region: Secondary | ICD-10-CM | POA: Diagnosis not present

## 2024-02-14 NOTE — Telephone Encounter (Signed)
 error

## 2024-02-17 ENCOUNTER — Other Ambulatory Visit: Payer: Self-pay

## 2024-02-18 ENCOUNTER — Other Ambulatory Visit: Payer: Self-pay

## 2024-02-20 ENCOUNTER — Ambulatory Visit: Payer: Self-pay

## 2024-02-20 NOTE — Telephone Encounter (Signed)
  FYI Only or Action Required?: Action required by provider: clinical question for provider.  Patient was last seen in primary care on 02/11/2024 by [name], Susan K, MD. Called Nurse Triage reporting Advice Only and New Med Request. Symptoms began several weeks ago. Interventions attempted: Nothing. Symptoms are: unchanged.  Triage Disposition: Call PCP When Office is Open  Patient/caregiver understands and will follow disposition?: Yes                             Copied from CRM [id number]. Topic: Clinical - Medication Question >> Feb 20, 2024  1:02 PM Shardie S wrote: Reason for CRM: Patient is wanting to know if provider will write a prescription for oxyCODONE  (Oxy IR/ROXICODONE ) immediate release tablet 10 mg. She states she is starting with a new pain doctor.   Callback # [phone] Reason for Disposition  [1] Caller requesting NON-URGENT health information AND [2] PCP's office is the best resource  Answer Assessment - Initial Assessment Questions 1. REASON FOR CALL or QUESTION: What is your reason for calling today? or How can I best help you? or What question do you have that I can help answer?     Patient called requesting provider to prescribe oxycodone . Patient denied new/worsening symptoms that were discussed at OV on 02/11/24, but stated she is still experiencing hip pain. Patient also stated that she has not gotten her x-rays done because she does not know where she is supposed to go. Please advise.  Protocols used: Information Only Call - No Triage-A-AH

## 2024-02-25 ENCOUNTER — Telehealth: Payer: Self-pay | Admitting: Family Medicine

## 2024-02-25 NOTE — Telephone Encounter (Signed)
 Copied from CRM [id number]. Topic: Clinical - Medication Question >> Feb 20, 2024  1:02 PM Shardie S wrote: Reason for CRM: Patient is wanting to know if provider will write a prescription for oxyCODONE  (Oxy IR/ROXICODONE ) immediate release tablet 10 mg. She states she is starting with a new pain doctor.   Callback # [phone]

## 2024-03-03 ENCOUNTER — Ambulatory Visit: Payer: Self-pay

## 2024-03-03 NOTE — Telephone Encounter (Signed)
 FYI Only or Action Required?: FYI only for provider.  Patient was last seen in primary care on 02/11/2024 by Abigail [name], Abigail K, MD.  Called Nurse Triage reporting Leg Swelling.  Symptoms began a week ago.  Interventions attempted: Other: home health advised patient to elevate her lower extremities.  Symptoms are: pitting edema in bilateral lower extremities with redness at her feet and weeping unchanged.  Triage Disposition: See Physician Within 24 Hours  Patient/caregiver understands and will follow disposition?: Yes          Copied from CRM [id number]. Topic: Clinical - Red Word Triage >> Mar 03, 2024  2:15 PM Rosaria E wrote: Red Word that prompted transfer to Nurse Triage: Edema in both legs, weeping Reason for Disposition  [1] MODERATE leg swelling (e.g., swelling extends up to knees) AND [2] new-onset or getting worse  Answer Assessment - Initial Assessment Questions 1. ONSET: When did the swelling start? (e.g., minutes, hours, days)     X 1 week.  2. LOCATION: What part of the leg is swollen?  Are both legs swollen or just one leg?     Both legs, from feet to knees.  3. SEVERITY: How bad is the swelling? (e.g., localized; mild, moderate, severe)     Moderate. Pitting +2 edema.  4. REDNESS: Is there redness or signs of infection?     Redness in both  feet with weeping.  5. PAIN: Is the swelling painful to touch? If Yes, ask: How painful is it?   (Scale 1-10; mild, moderate or severe)     No.  6. FEVER: Do you have a fever? If Yes, ask: What is it, how was it measured, and when did it start?      No.  7. CAUSE: What do you think is causing the leg swelling?     States patient was told in the hospital she has CHF but it was not put in her chart.  8. MEDICAL HISTORY: Do you have a history of blood clots (e.g., DVT), cancer, heart failure, kidney disease, or liver failure?    CHF,blood clots.  9. RECURRENT SYMPTOM: Have you had leg swelling  before? If Yes, ask: When was the last time? What happened that time?     Yes, the patient states she has had swelling in her feet but not this far up her legs.  10. OTHER SYMPTOMS: Do you have any other symptoms? (e.g., chest pain, difficulty breathing)       Patient denies chest pain, difficulty breathing.  11. PREGNANCY: Is there any chance you are pregnant? When was your last menstrual period?       N/A.  Protocols used: Leg Swelling and Edema-A-AH

## 2024-03-04 ENCOUNTER — Ambulatory Visit (INDEPENDENT_AMBULATORY_CARE_PROVIDER_SITE_OTHER): Admitting: Family Medicine

## 2024-03-04 ENCOUNTER — Other Ambulatory Visit: Payer: Self-pay

## 2024-03-04 ENCOUNTER — Emergency Department
Admission: EM | Admit: 2024-03-04 | Discharge: 2024-03-04 | Disposition: A | Discharge status: Home / Self Care | Attending: Emergency Medicine | Admitting: Emergency Medicine

## 2024-03-04 ENCOUNTER — Encounter: Payer: Self-pay | Admitting: Family Medicine

## 2024-03-04 ENCOUNTER — Emergency Department

## 2024-03-04 VITALS — BP 105/74 | HR 102 | Temp 97.9°F | Resp 18 | Ht 65.0 in | Wt 126.0 lb

## 2024-03-04 DIAGNOSIS — M8588 Other specified disorders of bone density and structure, other site: Secondary | ICD-10-CM | POA: Diagnosis not present

## 2024-03-04 DIAGNOSIS — M542 Cervicalgia: Secondary | ICD-10-CM | POA: Diagnosis not present

## 2024-03-04 DIAGNOSIS — R296 Repeated falls: Secondary | ICD-10-CM | POA: Insufficient documentation

## 2024-03-04 DIAGNOSIS — M4802 Spinal stenosis, cervical region: Secondary | ICD-10-CM | POA: Diagnosis not present

## 2024-03-04 DIAGNOSIS — L03119 Cellulitis of unspecified part of limb: Secondary | ICD-10-CM | POA: Diagnosis not present

## 2024-03-04 DIAGNOSIS — Z043 Encounter for examination and observation following other accident: Secondary | ICD-10-CM | POA: Diagnosis not present

## 2024-03-04 DIAGNOSIS — W01198A Fall on same level from slipping, tripping and stumbling with subsequent striking against other object, initial encounter: Secondary | ICD-10-CM | POA: Insufficient documentation

## 2024-03-04 DIAGNOSIS — S0083XA Contusion of other part of head, initial encounter: Secondary | ICD-10-CM | POA: Insufficient documentation

## 2024-03-04 DIAGNOSIS — T07XXXA Unspecified multiple injuries, initial encounter: Secondary | ICD-10-CM

## 2024-03-04 DIAGNOSIS — M810 Age-related osteoporosis without current pathological fracture: Secondary | ICD-10-CM | POA: Diagnosis not present

## 2024-03-04 DIAGNOSIS — M25552 Pain in left hip: Secondary | ICD-10-CM | POA: Insufficient documentation

## 2024-03-04 DIAGNOSIS — I2699 Other pulmonary embolism without acute cor pulmonale: Secondary | ICD-10-CM | POA: Diagnosis not present

## 2024-03-04 DIAGNOSIS — Z7901 Long term (current) use of anticoagulants: Secondary | ICD-10-CM | POA: Insufficient documentation

## 2024-03-04 DIAGNOSIS — S0990XA Unspecified injury of head, initial encounter: Secondary | ICD-10-CM | POA: Insufficient documentation

## 2024-03-04 DIAGNOSIS — S0081XA Abrasion of other part of head, initial encounter: Secondary | ICD-10-CM | POA: Diagnosis not present

## 2024-03-04 DIAGNOSIS — R9082 White matter disease, unspecified: Secondary | ICD-10-CM | POA: Diagnosis not present

## 2024-03-04 DIAGNOSIS — L0390 Cellulitis, unspecified: Secondary | ICD-10-CM | POA: Insufficient documentation

## 2024-03-04 DIAGNOSIS — J449 Chronic obstructive pulmonary disease, unspecified: Secondary | ICD-10-CM | POA: Diagnosis not present

## 2024-03-04 DIAGNOSIS — M5031 Other cervical disc degeneration,  high cervical region: Secondary | ICD-10-CM | POA: Diagnosis not present

## 2024-03-04 DIAGNOSIS — W19XXXA Unspecified fall, initial encounter: Secondary | ICD-10-CM

## 2024-03-04 DIAGNOSIS — J439 Emphysema, unspecified: Secondary | ICD-10-CM | POA: Diagnosis not present

## 2024-03-04 DIAGNOSIS — R5383 Other fatigue: Secondary | ICD-10-CM | POA: Insufficient documentation

## 2024-03-04 DIAGNOSIS — T148XXA Other injury of unspecified body region, initial encounter: Secondary | ICD-10-CM

## 2024-03-04 MED ORDER — AMBULATORY NON FORMULARY MEDICATION: 0 refills | Status: AC

## 2024-03-04 MED ORDER — CEPHALEXIN 500 MG PO CAPS: 500.0000 mg | ORAL_CAPSULE | Freq: Three times a day (TID) | ORAL | 0 refills | Status: DC

## 2024-03-04 MED ORDER — APIXABAN 5 MG PO TABS: 5.0000 mg | ORAL_TABLET | Freq: Two times a day (BID) | ORAL | 5 refills | Status: DC

## 2024-03-04 NOTE — Assessment & Plan Note (Signed)
.    She reports she did get her bone mineral density scan.  She is scheduled for Reclast infusion today.  Reports she is taking vitamin D  and calcium .

## 2024-03-04 NOTE — ED Triage Notes (Signed)
 Pt reports she got tripped this morning and fell hitting her head on the floor, pt states she takes eliquis . Pt reports she had cervical fusion in 2012 and is also having neck pain.

## 2024-03-04 NOTE — Progress Notes (Signed)
 Established Patient Office Visit  Subjective   Patient ID: Abigail [name] [facility], female    DOB: [birth year]  Age: 58 y.o. MRN: [id number]  Chief Complaint  Patient presents with   teeth grinding    Lost 9 teeth     Fall    Multiple falls   Leg Swelling    HPI      Abigail [name] is a 58 year old female with epilepsy (intractable epilepsy episode due to noncompliance), pulmonary embolism (on Eliquis ) , cachexia , COPD (emphysema, 10/23/2023 acute respiratory failure, O2 sat 84% when EMS picked her up, now on O2), RA, GCA (Bx proven, treated with steroids), hepatic steatosis, chronic pancreatitis (pancreatic pseudocyst), hypothyroidism, Chronic back pain, hypokalemia, IBS fibromyalgia, Hx aspiration pneumonia, depression/ anxiety, left hip pain and bilateral lower extremity edema, s/p cervical fixation at C5-6 and C 6-7.      She has had multiple falls in the last few days.  At times she gets her feet tangled up.  Other times she is tripped over her feet.  This morning she was seated on the sofa and leaned forward and fell off the sofa.   She landed on her head.  She has a bruise on her forehead from her fall this morning.  She denied LOC.  She was having neck pain after her fall so she went to the ER this morning and got a CT scan of her head and neck.  CT scan of cervical spine showed no evidence of acute traumatic injury, mild chronic DDD at C3-4 and C4-5.  CT scan of her brain showed mild periventricular white matter disease but no evidence of hemorrhage, mass or cortical infarct. She has gained 5 pounds to 126 however suspect the weight gain of 5 pounds may be from fluid in her legs.  She has had swelling from the knees down bilaterally and feels that her left leg is more swollen than the right.  She has compression stockings but has not put these on.  She did take Lasix  20 mg daily and it had little effect on her peripheral edema.  Her legs have started weeping. She is  complaining of left hip pain.  She was walking in her kitchen and felt and heard a large pop in her left pelvic area.  She has been having pain in the center of her left buttock.  She has orders at [facility] for x-rays of her left hip but has not done this yet. She has not been taking her Eliquis  because she feels so cold. She reports that she is eating 1 meal a day and then snacks throughout the night.  She is not compliant with Remeron  7.5 mg nightly. She reports she got her bone mineral density scan and she is now 5 feet 2 inches tall.  She reports when she was younger she was 5 feet 9 inches tall.  She reports she is due to get a Reclast infusion later today.  She reports she has been taking vitamin D  and calcium . She asked for refill of modafinil .  Review of PDMP shows that she has not had modafinil  since 07/2023 and that this was prescribed by her pain doctor. She has asked for a prescription of Xanax  and Prozac.  Advise she is already on Cymbalta  and mirtazapine  for depression and anxiety.  Advise she cannot take Xanax  because she chronically takes opiates.  ROS    Objective:     BP 105/74 (BP Location:  Left Arm, Patient Position: Sitting, Cuff Size: Normal)   Pulse (!) 102   Temp 97.9 F (36.6 C) (Oral)   Resp 18   Ht 5' 5 (1.651 m)   Wt 126 lb (57.2 kg)   SpO2 (!) 83%   BMI 20.97 kg/m    Physical Exam Vitals and nursing note reviewed.  Constitutional:      Appearance: Normal appearance.  HENT:     Head: Normocephalic and atraumatic.  Eyes:     Conjunctiva/sclera: Conjunctivae normal.  Cardiovascular:     Rate and Rhythm: Normal rate and regular rhythm.  Pulmonary:     Effort: Pulmonary effort is normal.     Breath sounds: Normal breath sounds.  Musculoskeletal:     Right lower leg: Edema (3+ pitting edema) present.     Left lower leg: Edema (3+ pitting edema) present.  Skin:    General: Skin is warm and dry.  Neurological:     Mental Status: She is alert  and oriented to person, place, and time.  Psychiatric:        Mood and Affect: Mood normal.        Behavior: Behavior normal.        Thought Content: Thought content normal.        Judgment: Judgment normal.          No results found for any visits on 03/04/24.    The 10-year ASCVD risk score (Arnett DK, et al., 2019) is: 8.3%    Assessment & Plan:  Cellulitis of lower extremity, unspecified laterality Assessment & Plan: She has 3+ pitting edema in both lower extremities to the knee.  Both lower extremities are weeping and have several ulcerated lesions.  Developing hyperemia of the distal leg right.  Will treat with Keflex  for a week.  Wrapped her legs in Ace bandages to provide some compression.  Prescription for compression stockings given.  Please put these on in the morning and remove at night.  Orders: -     Cephalexin ; Take 1 capsule (500 mg total) by mouth 3 (three) times daily.  Dispense: 21 capsule; Refill: 0 -     AMBULATORY NON FORMULARY MEDICATION; Knee-high, medium compression, graduated compression stockings. Apply to lower extremities. [email or web address], Zippered Compression Stockings, medium circ, long length  Dispense: 1 each; Refill: 0  Pulmonary embolism without acute cor pulmonale, unspecified chronicity, unspecified pulmonary embolism type (HCC) -     Apixaban ; Take 1 tablet (5 mg total) by mouth 2 (two) times daily.  Dispense: 60 tablet; Refill: 5  Pulmonary embolism and infarction [facility]) Assessment & Plan: She has stopped taking her Eliquis  because she feels it is making her cold.  Advised her to dress more warmly and to stay on her Eliquis  5 mg twice daily.   Fatigue, unspecified type Assessment & Plan: She reports that she is extremely tired and would like modafinil  refilled.  Advised she needs to get this from her pain doctor.   Frequent falls Assessment & Plan: Had a CT scan of her neck and brain this morning.  Both scans were normal.  She  has a rollator to assist in ambulation.  Consideration of sending her to PT for gait training   Osteopenia of lumbar spine Assessment & Plan: .  She reports she did get her bone mineral density scan.  She is scheduled for Reclast infusion today.  Reports she is taking vitamin D  and calcium .      Return in about 1 week (around  03/11/2024).    Tasheem Elms K Treavon Castilleja, MD

## 2024-03-04 NOTE — Discharge Instructions (Addendum)
 Please make sure to see your primary care doctor for your appointment today.  Here is a list of medications in your chart that you had requested.  List of medications in your chart: albuterol  (VENTOLIN  HFA) 108 (90 Base) MCG/ACT inhaler apixaban  (ELIQUIS ) 5 MG TABS tablet bisacodyl  (DULCOLAX) 5 MG EC tablet carbamazepine  (TEGRETOL  XR) 100 MG 12 hr tablet (Expired) DULoxetine  (CYMBALTA ) 60 MG capsule furosemide  (LASIX ) 20 MG tablet gabapentin  (NEURONTIN ) 300 MG capsule (Expired) lamoTRIgine  (LAMICTAL ) 25 MG tablet levETIRAcetam  (KEPPRA ) 750 MG tablet megestrol  (MEGACE ) 400 MG/10ML suspension Melatonin 10 MG TABS mirtazapine  (REMERON ) 7.5 MG tablet morphine  (MSIR) 15 MG tablet NON FORMULARY ondansetron  (ZOFRAN ) 4 MG tablet pantoprazole  (PROTONIX ) 40 MG tablet potassium chloride  (KLOR-CON  M) 10 MEQ tablet

## 2024-03-04 NOTE — ED Notes (Signed)
 Patient transported to CT

## 2024-03-04 NOTE — Assessment & Plan Note (Signed)
 She has 3+ pitting edema in both lower extremities to the knee.  Both lower extremities are weeping and have several ulcerated lesions.  Developing hyperemia of the distal leg right.  Will treat with Keflex  for a week.  Wrapped her legs in Ace bandages to provide some compression.  Prescription for compression stockings given.  Please put these on in the morning and remove at night.

## 2024-03-04 NOTE — ED Provider Notes (Signed)
 Abigail [name] Provider Note    Event Date/Time   First MD Initiated Contact with Patient 03/04/24 [phone]     (approximate)   History   Fall   HPI  Abigail [name] is a 58 y.o. female with history of epilepsy, pulmonary embolism on Eliquis , COPD, presenting with neck pain after fall.  Patient states that she tripped and fell.  States that the pain is over the right lateral neck.  She denies any numbness or tingling.  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  Denies any pain anywhere else.  Did hit her head but no LOC.  Did not take any medications for her pain, has not taken her medications this morning, last took a blood thinner yesterday.  States that she was in her usual state of health before she fell.  Has a primary care doctor appointment at 11 this morning.  On independent chart review, she was seen by her primary care doctor at the end of June, has history of RA had presented at that time for left hip pain after mechanical fall.     Physical Exam   Triage Vital Signs: ED Triage Vitals  Encounter Vitals Group     BP 03/04/24 0622 133/69     Girls Systolic BP Percentile --      Girls Diastolic BP Percentile --      Boys Systolic BP Percentile --      Boys Diastolic BP Percentile --      Pulse Rate 03/04/24 0622 81     Resp 03/04/24 0622 18     Temp 03/04/24 0622 98 F (36.7 C)     Temp src --      SpO2 03/04/24 0622 99 %     Weight 03/04/24 0621 125 lb (56.7 kg)     Height 03/04/24 0621 5' 5 (1.651 m)     Head Circumference --      Peak Flow --      Pain Score 03/04/24 0620 7     Pain Loc --      Pain Education --      Exclude from Growth Chart --     Most recent vital signs: Vitals:   03/04/24 0622  BP: 133/69  Pulse: 81  Resp: 18  Temp: 98 F (36.7 C)  SpO2: 99%     General: Awake, no distress.  CV:  Good peripheral perfusion.  Resp:  Normal effort.  No thoracic cage  tenderness Abd:  No distention.  Soft nontender Other:  Pupils are equal reactive, extraocular movements are intact, no palpable scalp or facial deformities, she does have abrasions to her forehead as well as some bruising to her left forehead, no intraoral lesions, no midline spinal tenderness, full range of motion of neck is intact, full range of motion of all extremities are intact without focal weakness or numbness, palpable DP and PT pulses bilaterally.  No tenderness to her extremities.   ED Results / Procedures / Treatments   Labs (all labs ordered are listed, but only abnormal results are displayed) Labs Reviewed - No data to display   RADIOLOGY On my independent interpretation, CT head without obvious intracranial hemorrhage   PROCEDURES:  Critical Care performed: No  Procedures   MEDICATIONS ORDERED IN ED: Medications - No data to display   IMPRESSION / MDM / ASSESSMENT AND PLAN / ED COURSE  I reviewed the triage vital signs  and the nursing notes.                              Differential diagnosis includes, but is not limited to, closed injury, contusion, abrasions, intracranial hemorrhage, fracture.  CT head, cervical spine.  Offered pain medications but patient declined at this time.  Patient's presentation is most consistent with acute presentation with potential threat to life or bodily function.  Independent interpretation of imaging below.  Discussed with patient and mom about imaging results including incidental findings.  CT imaging is reassuring, no acute traumatic injury at this time.  Considered but no indication for inpatient admission at this time, she safe for outpatient management.  Will discharge with strict return precautions as well as instructions to follow-up with her primary care doctor today for her appointment.  Patient wanted a printout of her active medications at this time, will copy and paste the list into the discharge paperwork for her to  handover to her primary care doctor.  Shared decision making done with patient and mom and they are agreeable with this plan.  Strict return precautions given.  Discharge.    Clinical Course as of 03/04/24 0724  Wed Mar 04, 2024  0713 CT Cervical Spine Wo Contrast IMPRESSION: 1. No evidence of acute traumatic injury. 2. Mild chronic degenerative disc disease at C3-4 and C4-5 with moderate bilateral neural foraminal stenosis at C4-5. 3. Emphysema.   [TT]  0713 CT Head Wo Contrast IMPRESSION: 1. No evidence of acute traumatic injury. 2. Chronic left maxillary sinus disease.   [TT]    Clinical Course User Index [TT] Waymond Lorelle Cummins, MD     FINAL CLINICAL IMPRESSION(S) / ED DIAGNOSES   Final diagnoses:  Fall, initial encounter  Multiple abrasions  Hematoma  Closed head injury, initial encounter     Rx / DC Orders   ED Discharge Orders     None        Note:  This document was prepared using Dragon voice recognition software and may include unintentional dictation errors.    Waymond Lorelle Cummins, MD 03/04/24 [phone]

## 2024-03-04 NOTE — Assessment & Plan Note (Signed)
 She reports that she is extremely tired and would like modafinil  refilled.  Advised she needs to get this from her pain doctor.

## 2024-03-04 NOTE — Assessment & Plan Note (Signed)
 She has stopped taking her Eliquis  because she feels it is making her cold.  Advised her to dress more warmly and to stay on her Eliquis  5 mg twice daily.

## 2024-03-04 NOTE — Assessment & Plan Note (Addendum)
 Had a CT scan of her neck and brain this morning.  Both scans were normal.  She has a rollator to assist in ambulation.  Consideration of sending her to PT for gait training

## 2024-03-05 ENCOUNTER — Telehealth: Payer: Self-pay

## 2024-03-05 ENCOUNTER — Other Ambulatory Visit: Payer: Self-pay | Admitting: Family Medicine

## 2024-03-05 ENCOUNTER — Other Ambulatory Visit: Payer: Self-pay

## 2024-03-05 ENCOUNTER — Ambulatory Visit
Admission: RE | Admit: 2024-03-05 | Discharge: 2024-03-05 | Disposition: A | Discharge status: Home / Self Care | Source: Ambulatory Visit | Attending: Family Medicine | Admitting: Family Medicine

## 2024-03-05 DIAGNOSIS — M25552 Pain in left hip: Secondary | ICD-10-CM | POA: Diagnosis present

## 2024-03-05 DIAGNOSIS — L03119 Cellulitis of unspecified part of limb: Secondary | ICD-10-CM

## 2024-03-05 MED ORDER — DOXYCYCLINE HYCLATE 100 MG PO CAPS: 100.0000 mg | ORAL_CAPSULE | Freq: Two times a day (BID) | ORAL | 0 refills | Status: DC

## 2024-03-05 NOTE — Telephone Encounter (Signed)
 Copied from CRM [id number]. Topic: Clinical - Prescription Issue >> Mar 05, 2024  9:21 AM Robinson H wrote: Reason for CRM: Patients mom is calling regarding the antibiotics sent in yesterday for patient, states she went to pick up and pharmacy informed her that patient is allergic to medication since it has sulfur in it and patient is allergic to sulfur so she didn't pick it up, needs an alternative sent in.  [name] [phone]

## 2024-03-05 NOTE — Telephone Encounter (Signed)
 Patient states that Kelfex/Cephalexin  should be listed on her allergy list because she had a reaction to that medication before.

## 2024-03-06 ENCOUNTER — Encounter: Payer: Self-pay | Admitting: Family Medicine

## 2024-03-06 ENCOUNTER — Other Ambulatory Visit: Payer: Self-pay | Admitting: Family Medicine

## 2024-03-06 ENCOUNTER — Ambulatory Visit: Payer: Self-pay | Admitting: Family Medicine

## 2024-03-06 DIAGNOSIS — M25551 Pain in right hip: Secondary | ICD-10-CM

## 2024-03-10 ENCOUNTER — Ambulatory Visit: Payer: Self-pay

## 2024-03-10 ENCOUNTER — Telehealth: Payer: Self-pay | Admitting: Family Medicine

## 2024-03-10 NOTE — Telephone Encounter (Signed)
 FYI Only or Action Required?: Action required by provider: update on patient condition and wound care orders.  Patient was last seen in primary care on 03/04/2024 by [name], Susan K, MD.  Called Nurse Triage reporting Advice Only.  Symptoms began several days ago.  Symptoms are: gradually worsening.  Triage Disposition: Call PCP Now  Patient/caregiver understands and will follow disposition?: Yes         Copied from CRM [id number]. Topic: Clinical - Red Word Triage >> Mar 10, 2024 11:46 AM Elle L wrote: Red Word that prompted transfer to Nurse Triage: Nurse Kristie with [facility] was calling to advise that the patient has edema and two open wounds that are red, open, and leaking and causing the patient pain. The patient was prescribed doxycyline previously but she is concerned that it needs wrapped. Reason for Disposition  Doctor (or NP/PA) call to PCP  Answer Assessment - Initial Assessment Questions 1. REASON FOR CALL or QUESTION: What is your reason for calling today? or How can I best     [facility] RN calling in regards to patient has wounds on her BLE and edema.  2. CALLER: Document the source of call. (e.g., laboratory staff, caregiver or patient).     Damian RN with [facility]  Protocols used: PCP Call - No Triage-A-AH

## 2024-03-10 NOTE — Telephone Encounter (Signed)
 Per Dr. [name], request has been approved for wound care and [facility]. Left voice mail requesting a return call.

## 2024-03-10 NOTE — Telephone Encounter (Signed)
 Spoke with Abigail [name] and provided verbal order. She states once they get the supplies they will either start at the end of the week or the beginning of next week. Compression stockings has been ordered.

## 2024-03-10 NOTE — Telephone Encounter (Signed)
 2 open wounds to left legs and 1 open to right and 3-4 plus edema, with mid-mod serous drainage. HH nurse is wanting orders or wound cleaning and unna boots or dressing. States that she will see pt again later this week and would need the orders either faxed or sent back by pt. States pt is also on Lasix  but only 20 mg daily.

## 2024-03-10 NOTE — Telephone Encounter (Signed)
 Copied from CRM [id number]. Topic: General - Other >> Mar 10, 2024  3:31 PM Avram MATSU wrote: Reason for CRM: Shonna from [facility] missed a call from tara and would like a callback [phone]

## 2024-03-11 ENCOUNTER — Ambulatory Visit (INDEPENDENT_AMBULATORY_CARE_PROVIDER_SITE_OTHER): Admitting: Family Medicine

## 2024-03-11 ENCOUNTER — Encounter: Payer: Self-pay | Admitting: Family Medicine

## 2024-03-11 VITALS — BP 116/81 | HR 93 | Temp 98.3°F | Resp 18 | Ht 65.0 in | Wt 132.0 lb

## 2024-03-11 DIAGNOSIS — E876 Hypokalemia: Secondary | ICD-10-CM

## 2024-03-11 DIAGNOSIS — I83009 Varicose veins of unspecified lower extremity with ulcer of unspecified site: Secondary | ICD-10-CM | POA: Insufficient documentation

## 2024-03-11 DIAGNOSIS — I83028 Varicose veins of left lower extremity with ulcer other part of lower leg: Secondary | ICD-10-CM | POA: Diagnosis not present

## 2024-03-11 DIAGNOSIS — L97801 Non-pressure chronic ulcer of other part of unspecified lower leg limited to breakdown of skin: Secondary | ICD-10-CM | POA: Diagnosis not present

## 2024-03-11 DIAGNOSIS — L97829 Non-pressure chronic ulcer of other part of left lower leg with unspecified severity: Secondary | ICD-10-CM

## 2024-03-11 DIAGNOSIS — L97901 Non-pressure chronic ulcer of unspecified part of unspecified lower leg limited to breakdown of skin: Secondary | ICD-10-CM | POA: Insufficient documentation

## 2024-03-11 DIAGNOSIS — I872 Venous insufficiency (chronic) (peripheral): Secondary | ICD-10-CM

## 2024-03-11 DIAGNOSIS — K219 Gastro-esophageal reflux disease without esophagitis: Secondary | ICD-10-CM | POA: Diagnosis not present

## 2024-03-11 MED ORDER — POTASSIUM CHLORIDE CRYS ER 10 MEQ PO TBCR: 10.0000 meq | EXTENDED_RELEASE_TABLET | Freq: Every day | ORAL | 1 refills | Status: DC

## 2024-03-11 MED ORDER — PANTOPRAZOLE SODIUM 40 MG PO TBEC: 40.0000 mg | DELAYED_RELEASE_TABLET | Freq: Every day | ORAL | 1 refills | Status: DC

## 2024-03-11 NOTE — Progress Notes (Signed)
 Established Patient Office Visit  Subjective   Patient ID: Abigail [name] [facility], female    DOB: January 22, 1966  Age: 58 y.o. MRN: [id number]  Chief Complaint  Patient presents with   Medical Management of Chronic Issues    [name] delightful 58 year [facility] female with epilepsy (intractable epilepsy episode due to noncompliance), pulmonary embolism (on Eliquis ) , cachexia , COPD (emphysema, 10/23/2023 acute respiratory failure, O2 sat 84% when EMS picked her up, now on O2), RA, GCA (Bx proven, treated with steroids), hepatic steatosis, chronic pancreatitis (pancreatic pseudocyst), hypothyroidism, Chronic back pain, hypokalemia, IBS fibromyalgia, Hx aspiration pneumonia, depression/ anxiety, left hip pain and bilateral lower extremity edema (normal ABIs 05/2023) s/p cervical fixation at C5-6 and C 6-7.       She is here for recheck of cellulitis of both lower extremities.  She has venous stasis ulcers on both lower extremities and is taking doxycycline  100 mg twice daily.  She has about 3 days of antibiotics left.  She did get compression stockings at [facility] but they are low compression. [facility] health [facility].  Contact  phone number is [phone] She saw vascular surgery 06/03/2019 for and was told her ABIs are normal bilaterally.  She did not get her veins checked. She has had hypokalemia and is now taking potassium chloride  10 mEq daily.     ROS    Objective:     BP 116/81 (BP Location: Left Arm, Patient Position: Sitting, Cuff Size: Normal)   Pulse 93   Temp 98.3 F (36.8 C) (Oral)   Resp 18   Ht 5' 5 (1.651 m)   Wt 132 lb (59.9 kg)   SpO2 94%   BMI 21.97 kg/m    Physical Exam Vitals and nursing note reviewed.  Constitutional:      Appearance: Normal appearance.  HENT:     Head: Normocephalic and atraumatic.  Eyes:     Conjunctiva/sclera: Conjunctivae normal.  Cardiovascular:     Rate and Rhythm:  Normal rate and regular rhythm.  Pulmonary:     Effort: Pulmonary effort is normal.     Breath sounds: Normal breath sounds.  Musculoskeletal:     Right lower leg: No edema (3+ PE to knee).     Left lower leg: No edema (3+ PE to the knee).  Skin:    General: Skin is warm and dry.     Comments: Has 1cm shallow ulcer on her left anterior lateral leg.  Smaller ulcer on lateral leg right.  No surrounding erythema and clear drainage.    Neurological:     Mental Status: She is alert and oriented to person, place, and time.  Psychiatric:        Mood and Affect: Mood normal.        Behavior: Behavior normal.        Thought Content: Thought content normal.        Judgment: Judgment normal.          No results found for any visits on 03/11/24.    The 10-year ASCVD risk score (Arnett DK, et al., 2019) is: 10%    Assessment & Plan:  Gastroesophageal reflux disease, unspecified whether esophagitis present -     Pantoprazole  Sodium; Take 1 tablet (40 mg total) by mouth daily.  Dispense: 90 tablet; Refill: 1  Hypokalemia -     Potassium Chloride  [facility] ER; Take 1 tablet (10 mEq total)  by mouth daily.  Dispense: 90 tablet; Refill: 1 -     Basic metabolic panel with GFR  Venous insufficiency Assessment & Plan: Susect her leg edema is venous insuffiencey.  Will refer to Vascular for assessment.  Will contact Home Health and ask that they place a UNA boot bilaterally and change once a week.  Aslo advised that she needs higher compression stockings to the knees bilaterally.  Orders: -     Ambulatory referral to Vascular Surgery  Venous stasis ulcer of other part of left lower leg, unspecified ulcer stage, unspecified whether varicose veins present (HCC) -     Ambulatory referral to Vascular Surgery  Venous stasis ulcer of other part of lower leg limited to breakdown of skin without varicose veins, unspecified laterality (HCC) Assessment & Plan: Susect her leg edema is venous insuffiencey.   Will refer to Vascular for assessment.  Will contact Home Health and ask that they place a UNA boot bilaterally and change once a week.  Aslo advised that she needs higher compression stockings to the knees bilaterally.      Return in about 1 week (around 03/18/2024).    Ryson Bacha K Son Barkan, MD

## 2024-03-11 NOTE — Assessment & Plan Note (Signed)
 Susect her leg edema is venous insuffiencey.  Will refer to Vascular for assessment.  Will contact Home Health and ask that they place a UNA boot bilaterally and change once a week.  Aslo advised that she needs higher compression stockings to the knees bilaterally.

## 2024-03-11 NOTE — Assessment & Plan Note (Signed)
 She is taking potassium chloride  10 mEq daily.  Will check basic metabolic panel today.

## 2024-03-12 ENCOUNTER — Telehealth: Payer: Self-pay | Admitting: Family Medicine

## 2024-03-12 LAB — BASIC METABOLIC PANEL WITH GFR
BUN/Creatinine Ratio: 12 (ref 9–23)
BUN: 8 mg/dL (ref 6–24)
CO2: 27 mmol/L (ref 20–29)
Calcium: 8.5 mg/dL — ABNORMAL LOW (ref 8.7–10.2)
Chloride: 95 mmol/L — ABNORMAL LOW (ref 96–106)
Creatinine, Ser: 0.66 mg/dL (ref 0.57–1.00)
Glucose: 85 mg/dL (ref 70–99)
Potassium: 4.4 mmol/L (ref 3.5–5.2)
Sodium: 135 mmol/L (ref 134–144)
eGFR: 102 mL/min/1.73 (ref >59)

## 2024-03-12 NOTE — Telephone Encounter (Unsigned)
 Copied from CRM #[id number]. Topic: General - Other >> Mar 12, 2024  8:25 AM Cleave MATSU wrote: Reason for CRM: shonna was calling Dr. [name] back. Please let Dr. [name] know to follow up [phone]

## 2024-03-12 NOTE — Telephone Encounter (Signed)
 Spoke with nurse from [facility].  Asked that they apply the unna boots and I will take them off.  Then they can come the day following her appointment and re-apply unna boots.

## 2024-03-12 NOTE — Telephone Encounter (Signed)
This is an Error.

## 2024-03-13 ENCOUNTER — Ambulatory Visit: Payer: Self-pay | Admitting: Family Medicine

## 2024-03-17 ENCOUNTER — Ambulatory Visit: Admitting: Family Medicine

## 2024-03-17 ENCOUNTER — Encounter (INDEPENDENT_AMBULATORY_CARE_PROVIDER_SITE_OTHER): Payer: Self-pay | Admitting: Vascular Surgery

## 2024-03-17 ENCOUNTER — Ambulatory Visit (INDEPENDENT_AMBULATORY_CARE_PROVIDER_SITE_OTHER): Admitting: Vascular Surgery

## 2024-03-17 VITALS — BP 100/73 | HR 88 | Ht 65.0 in | Wt 133.0 lb

## 2024-03-17 DIAGNOSIS — J4520 Mild intermittent asthma, uncomplicated: Secondary | ICD-10-CM | POA: Diagnosis not present

## 2024-03-17 DIAGNOSIS — I890 Lymphedema, not elsewhere classified: Secondary | ICD-10-CM | POA: Diagnosis not present

## 2024-03-17 DIAGNOSIS — K219 Gastro-esophageal reflux disease without esophagitis: Secondary | ICD-10-CM

## 2024-03-18 ENCOUNTER — Ambulatory Visit (INDEPENDENT_AMBULATORY_CARE_PROVIDER_SITE_OTHER): Admitting: Family Medicine

## 2024-03-18 ENCOUNTER — Encounter: Payer: Self-pay | Admitting: Family Medicine

## 2024-03-18 ENCOUNTER — Telehealth: Payer: Self-pay

## 2024-03-18 VITALS — BP 113/76 | HR 86 | Temp 99.1°F | Resp 18 | Ht 65.0 in | Wt 135.0 lb

## 2024-03-18 DIAGNOSIS — L97801 Non-pressure chronic ulcer of other part of unspecified lower leg limited to breakdown of skin: Secondary | ICD-10-CM | POA: Diagnosis not present

## 2024-03-18 DIAGNOSIS — R64 Cachexia: Secondary | ICD-10-CM

## 2024-03-18 DIAGNOSIS — I872 Venous insufficiency (chronic) (peripheral): Secondary | ICD-10-CM

## 2024-03-18 DIAGNOSIS — F339 Major depressive disorder, recurrent, unspecified: Secondary | ICD-10-CM

## 2024-03-18 DIAGNOSIS — E861 Hypovolemia: Secondary | ICD-10-CM

## 2024-03-18 NOTE — Telephone Encounter (Signed)
 Copied from CRM [id number]. Topic: General - Other >> Mar 18, 2024  1:12 PM Emylou G wrote: Reason for CRM: Patient has appt today with Dr [name].. pls fwd before appt [name] w/[facility] [phone] her BP 112/80 check today sitting down.. when she stood up it was 80/40 fell 3x this week Mon twice & Tues night.. no injuries and not dizzy patient said just has concerns and needed to relay

## 2024-03-18 NOTE — Progress Notes (Unsigned)
   Established Patient Office Visit  Subjective   Patient ID: Ellanore Vanhook [facility], female    DOB: 03-Dec-1965  Age: 58 y.o. MRN: [id number]  Chief Complaint  Patient presents with  . Medical Management of Chronic Issues    HPI   {History (Optional):23778}  ROS    Objective:     BP 113/76 (BP Location: Right Arm, Patient Position: Sitting, Cuff Size: Normal)   Pulse 86   Temp 99.1 F (37.3 C) (Oral)   Resp 18   Ht 5' 5 (1.651 m)   Wt 135 lb (61.2 kg)   SpO2 94%   BMI 22.47 kg/m  {Vitals History (Optional):23777}  Physical Exam ***  {Perform Simple Foot Exam  Perform Detailed exam:1} {Insert foot Exam (Optional):30965}   No results found for any visits on 03/18/24.  {Labs (Optional):23779}  The 10-year ASCVD risk score (Arnett DK, et al., 2019) is: 9.9%    Assessment & Plan:  There are no diagnoses linked to this encounter.   No follow-ups on file.    Niam Nepomuceno K Jeweldean Drohan, MD

## 2024-03-19 DIAGNOSIS — E861 Hypovolemia: Secondary | ICD-10-CM | POA: Insufficient documentation

## 2024-03-19 NOTE — Assessment & Plan Note (Signed)
 She has venous stasis ulcers 1 on each leg.  She is wearing Unna boots.  She is to come in once a week we will take her Unna boots off and check her wounds.  Home health is to apply the Unna boots twice a week.

## 2024-03-19 NOTE — Assessment & Plan Note (Signed)
 Sounds like she is getting her calories through Pepsi and not eating.  Encouraged her to only have 2 Pepsi's a day so that she is more hungry and will eat food.

## 2024-03-19 NOTE — Assessment & Plan Note (Addendum)
 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  She has orthostatic hypotension with blood pressure dropping to 80/40 on standing.  She denies dizziness or falling with standing.  She is hypovolemic because her fluid intake consist of Pepsi.  Please decrease your Pepsi consumption and drink more water.  Low blood pressure may also be affecting her appetite. Please hold your lasix  tomorrow and increase hydration.

## 2024-03-23 ENCOUNTER — Telehealth: Payer: Self-pay

## 2024-03-23 NOTE — Telephone Encounter (Signed)
 Copied from CRM [id number]. Topic: Clinical - Home Health Verbal Orders >> Mar 23, 2024  8:39 AM Rosaria BRAVO wrote: Pt called reporting that she needs a new order for home health care so she can be seen twice a week instead of once a week like how it is currently ordered. Please advise.

## 2024-03-24 ENCOUNTER — Telehealth: Payer: Self-pay

## 2024-03-24 NOTE — Telephone Encounter (Signed)
 Copied from CRM [id number]. Topic: General - Other >> Mar 24, 2024  2:07 PM Tiffany S wrote: Reason for CRM: Patient is asking if Dr ziglar still wants to see her Wednesday or wait until after her boot get changed please follow up with patient

## 2024-03-24 NOTE — Telephone Encounter (Signed)
 Provided verbal order with [name]/ [facility] to change HH from once weekly to twice a week.

## 2024-03-25 ENCOUNTER — Encounter: Payer: Self-pay | Admitting: Family Medicine

## 2024-03-25 ENCOUNTER — Ambulatory Visit (INDEPENDENT_AMBULATORY_CARE_PROVIDER_SITE_OTHER): Admitting: Family Medicine

## 2024-03-25 VITALS — BP 118/79 | HR 71 | Temp 98.1°F | Resp 18 | Ht 65.0 in | Wt 140.0 lb

## 2024-03-25 DIAGNOSIS — I872 Venous insufficiency (chronic) (peripheral): Secondary | ICD-10-CM

## 2024-03-25 DIAGNOSIS — R102 Pelvic and perineal pain unspecified side: Secondary | ICD-10-CM | POA: Insufficient documentation

## 2024-03-25 DIAGNOSIS — R600 Localized edema: Secondary | ICD-10-CM

## 2024-03-25 DIAGNOSIS — L97801 Non-pressure chronic ulcer of other part of unspecified lower leg limited to breakdown of skin: Secondary | ICD-10-CM | POA: Diagnosis not present

## 2024-03-25 MED ORDER — POTASSIUM CHLORIDE CRYS ER 20 MEQ PO TBCR: 20.0000 meq | EXTENDED_RELEASE_TABLET | Freq: Every day | ORAL | 3 refills | Status: DC

## 2024-03-25 MED ORDER — FUROSEMIDE 40 MG PO TABS: 40.0000 mg | ORAL_TABLET | Freq: Every day | ORAL | 3 refills | Status: DC

## 2024-03-25 NOTE — Assessment & Plan Note (Signed)
 She is scheduled for an MRI of her pelvis tomorrow.  Plain films suggested that she may have pelvic fractures.

## 2024-03-25 NOTE — Assessment & Plan Note (Addendum)
 Lymphadema per vascular.  Increasing her Lasix  to 40 mg daily and potassium chloride  20 mEq daily.  Please drink water.  Will check your basic in about 2 weeks.

## 2024-03-25 NOTE — Assessment & Plan Note (Addendum)
 Home Health is putting  UNNA boots on her twice a week and she comes here for me to take the Unna boot off and check her venous stasis ulcers.  She has 2 shallow ulcers on her right leg and 2 shallow ulcers on her left leg.  No surrounding erythema.  She has some mucoid drainage and fibrotic debris in the base of her wounds.  Will ask home health to use Santyl  debriding gel.  She has a referral for vascular surgery.

## 2024-03-25 NOTE — Progress Notes (Signed)
 Established Patient Office Visit  Subjective   Patient ID: Abigail [name], female    DOB: 11-09-1965  Age: 58 y.o. MRN: [id number]  Chief Complaint  Patient presents with   Medical Management of Chronic Issues    HPI Discussed the use of AI scribe software for clinical note transcription with the patient, who gave verbal consent to proceed.  History of Present Illness   Abigail [name] is a 58 year old female who presents with leg swelling and open wounds for wound care and management of fluid retention.  She has ongoing leg swelling and open wounds on both legs. Her weight has increased, which she attributes to fluid retention, as she is experiencing oliguria despite taking 20 mg of Lasix . She recalls two previous emergency room visits due to swollen feet and difficulty ambulating, during which she was prescribed 40 mg of Lasix . She is interested in trying this higher dose again to see if it helps with her symptoms.  She is currently using Unaboots, which are designed to contract as they dry and gradually squeeze fluid out of her legs.  She has a history of a small pulmonary embolism, for which she is taking Eliquis . She reports feeling cold and experiencing increased bleeding from minor scratches since starting Eliquis .  She is working on dietary changes, including reducing her intake of Pepsi due to its caffeine content. She is trying to eat six small meals a day, as larger meals cause nausea. She has been eating fresh vegetables, such as purple hull peas, and is making efforts to improve her nutrition.         ROS    Objective:     BP 118/79   Pulse 71   Temp 98.1 F (36.7 C) (Oral)   Resp 18   Ht 5' 5 (1.651 m)   Wt 140 lb (63.5 kg)   SpO2 92%   BMI 23.30 kg/m    Physical Exam Vitals and nursing note reviewed.  Constitutional:      Appearance: Normal appearance.  HENT:     Head: Normocephalic and atraumatic.  Eyes:      Conjunctiva/sclera: Conjunctivae normal.  Cardiovascular:     Rate and Rhythm: Normal rate and regular rhythm.     Comments: Two venous stasis ulcers on each leg that vary in size from 0.5cm to 1.3cm.  There is yellow necrotic tissue in the bases of 3 out of 4.   Pulmonary:     Effort: Pulmonary effort is normal.     Breath sounds: Normal breath sounds.  Musculoskeletal:     Right lower leg: 2+ Edema present.     Left lower leg: 2+ Edema present.  Skin:    General: Skin is warm and dry.  Neurological:     Mental Status: She is alert and oriented to person, place, and time.  Psychiatric:        Mood and Affect: Mood normal.        Behavior: Behavior normal.        Thought Content: Thought content normal.        Judgment: Judgment normal.          No results found for any visits on 03/25/24.    The 10-year ASCVD risk score (Arnett DK, et al., 2019) is: 10.8%    Assessment & Plan:  Peripheral edema Assessment & Plan: Lymphadema per vascular.  Increasing her Lasix  to 40 mg daily and potassium chloride  20 mEq  daily.  Please drink water.  Will check your basic in about 2 weeks.  Orders: -     Furosemide ; Take 1 tablet (40 mg total) by mouth daily.  Dispense: 30 tablet; Refill: 3 -     Potassium Chloride  [facility]; Take 1 tablet (20 mEq total) by mouth daily.  Dispense: 30 tablet; Refill: 3  Venous stasis ulcer of other part of lower leg limited to breakdown of skin without varicose veins, unspecified laterality [facility]) Assessment & Plan: Home Health is putting  UNNA boots on her twice a week and she comes here for me to take the Unna boot off and check her venous stasis ulcers.  She has 2 shallow ulcers on her right leg and 2 shallow ulcers on her left leg.  No surrounding erythema.  She has some mucoid drainage and fibrotic debris in the base of her wounds.  Will ask home health to use Santyl  debriding gel.  She has a referral for vascular surgery.   Pelvic pain Assessment &  Plan: She is scheduled for an MRI of her pelvis tomorrow.  Plain films suggested that she may have pelvic fractures.      Return in about 1 week (around 04/01/2024).    Abigail Fennimore K Embry Manrique, MD

## 2024-03-26 ENCOUNTER — Other Ambulatory Visit: Payer: Self-pay | Admitting: Family Medicine

## 2024-03-26 ENCOUNTER — Encounter (INDEPENDENT_AMBULATORY_CARE_PROVIDER_SITE_OTHER): Payer: Self-pay | Admitting: Vascular Surgery

## 2024-03-26 ENCOUNTER — Telehealth: Payer: Self-pay

## 2024-03-26 DIAGNOSIS — I872 Venous insufficiency (chronic) (peripheral): Secondary | ICD-10-CM

## 2024-03-26 MED ORDER — SANTYL 250 UNIT/GM EX OINT: 1.0000 | TOPICAL_OINTMENT | Freq: Every day | CUTANEOUS | 3 refills | Status: DC

## 2024-03-26 NOTE — Telephone Encounter (Signed)
 Copied from CRM [id number]. Topic: General - Call Back - No Documentation >> Mar 26, 2024  8:53 AM Elle L wrote: Reason for CRM: Aldrin, Physical Therapist with [facility], states he missed a call from the office. His call back number is [phone] and it is a secured voicemail if he is unable to answer the call.

## 2024-03-26 NOTE — Progress Notes (Signed)
 Subjective:    Patient ID: Abigail [name] [facility], female    DOB: [birth year], 58 y.o.   MRN: [id number] No chief complaint on file.   Abigail [name] is a  58 yo female who presents to clinic with chief complaint of bilateral lower extremity swelling with pain. Patient endorses she has home health nursing and they requested she come see [facility]. Patient states her leg swelling has increased over the past 3 months. It is now painful daily. Her lower extremities are now more red. She endorses being on water pills but they stopped working.      Review of Systems  Constitutional: Negative.   Cardiovascular:  Positive for leg swelling.  Skin:  Positive for color change.  All other systems reviewed and are negative.      Objective:   Physical Exam Vitals reviewed.  Constitutional:      Appearance: Normal appearance. She is obese.  HENT:     Head: Normocephalic.  Eyes:     Pupils: Pupils are equal, round, and reactive to light.  Cardiovascular:     Rate and Rhythm: Normal rate and regular rhythm.     Pulses: Normal pulses.     Heart sounds: Normal heart sounds.  Pulmonary:     Effort: Pulmonary effort is normal.     Breath sounds: Normal breath sounds.  Abdominal:     General: Abdomen is flat. Bowel sounds are normal.     Palpations: Abdomen is soft.  Musculoskeletal:        General: Swelling and tenderness present.     Right lower leg: Edema present.     Left lower leg: Edema present.  Skin:    General: Skin is warm and dry.     Capillary Refill: Capillary refill takes 2 to 3 seconds.  Neurological:     General: No focal deficit present.     Mental Status: She is alert and oriented to person, place, and time. Mental status is at baseline.  Psychiatric:        Mood and Affect: Mood normal.        Behavior: Behavior normal.        Thought Content: Thought content normal.        Judgment: Judgment normal.     BP 100/73   Pulse 88   Ht 5' 5 (1.651 m)    Wt 133 lb (60.3 kg)   BMI 22.13 kg/m   Past Medical History:  Diagnosis Date   Anxiety    Arthritis    Back pain    Basal cell carcinoma 10/04/2021   R axilla - needs excised 11/28/21   Basal cell carcinoma 10/04/2021   L antecubital excised 11/14/21   Diarrhea 11/12/2016   Fibromyalgia    Generalized abdominal pain 11/12/2016   H. pylori infection    Hyperlipidemia    IBS (irritable bowel syndrome)    Infectious colitis 04/29/2016   Migraines    Moderate dehydration 04/29/2016   Muscle pain    Opioid overdose (HCC) 12/07/2022   Reflux    Seizures (HCC)    Unexplained weight loss 11/12/2016    Social History   Socioeconomic History   Marital status: Divorced    Spouse name: Not on file   Number of children: Not on file   Years of education: Not on file   Highest education level: Not on file  Occupational History   Not on file  Tobacco Use   Smoking status:  Every Day    Current packs/day: 1.00    Types: Cigarettes    Passive exposure: Past   Smokeless tobacco: Never   Tobacco comments:    ELECTRONIC VAPOR  Substance and Sexual Activity   Alcohol use: Not Currently    Comment: OCCASIONALLY   Drug use: Not Currently   Sexual activity: Not Currently  Other Topics Concern   Not on file  Social History Narrative   Not on file   Social Drivers of Health   Financial Resource Strain: High Risk (03/10/2024)   Received from [facility]   Overall Financial Resource Strain (CARDIA)    Difficulty of Paying Living Expenses: Hard  Food Insecurity: Food Insecurity Present (03/10/2024)   Received from [facility]   Hunger Vital Sign    Within the past 12 months, you worried that your food would run out before you got the money to buy more.: Sometimes true    Within the past 12 months, the food you bought just didn't last and you didn't have money to get more.: Sometimes true  Transportation Needs: No Transportation Needs  (03/10/2024)   Received from [facility] - Transportation    In the past 12 months, has lack of transportation kept you from medical appointments or from getting medications?: No    Lack of Transportation (Non-Medical): No  Physical Activity: Inactive (01/27/2024)   Received from [facility]   Exercise Vital Sign    On average, how many days per week do you engage in moderate to strenuous exercise (like a brisk walk)?: 0 days    On average, how many minutes do you engage in exercise at this level?: 0 min  Stress: Stress Concern Present (01/27/2024)   Received from [facility] - Occupational Stress Questionnaire    Feeling of Stress : Very much  Social Connections: Moderately Isolated (01/27/2024)   Received from [facility]   Social Connection and Isolation Panel    In a typical week, how many times do you talk on the phone with family, friends, or neighbors?: More than three times a week    How often do you get together with friends or relatives?: Twice a week    How often do you attend [place] or religious services?: 1 to 4 times per year    Do you belong to any clubs or organizations such as Vey groups, unions, fraternal or athletic groups, or school groups?: No    How often do you attend meetings of the clubs or organizations you belong to?: Never    Are you married, widowed, divorced, separated, never married, or living with a partner?: Divorced  Intimate Partner Violence: Not At Risk (12/29/2023)   Humiliation, Afraid, Rape, and Kick questionnaire    Fear of Current or Ex-Partner: No    Emotionally Abused: No    Physically Abused: No    Sexually Abused: No    Past Surgical History:  Procedure Laterality Date   ABDOMINAL HYSTERECTOMY     APPENDECTOMY  2009   C5 FUSION     C6 FUSION     C7 FUSION     COLONOSCOPY  02/2006   COLONOSCOPY WITH PROPOFOL  N/A  12/25/2016   Procedure: COLONOSCOPY WITH PROPOFOL ;  Surgeon: Dessa Reyes ORN, MD;  Location: [facility] ENDOSCOPY;  Service: Endoscopy;  Laterality: N/A;   ESOPHAGOGASTRODUODENOSCOPY (EGD) WITH PROPOFOL  N/A 12/25/2016  Procedure: ESOPHAGOGASTRODUODENOSCOPY (EGD) WITH PROPOFOL ;  Surgeon: Dessa Reyes ORN, MD;  Location: [facility] ENDOSCOPY;  Service: Endoscopy;  Laterality: N/A;   FOOT SURGERY     HIP ARTHROPLASTY     L4 FUSION     L5 FUSION     S1 FUSION      Family History  Problem Relation Age of Onset   Diabetes Father    Hypertension Father     Allergies  Allergen Reactions   Nsaids Hives   Tapentadol Swelling, Rash and Other (See Comments)    Nucynta- Made her deathly sick   Cephalexin  Other (See Comments)    Reaction not cited   Codeine Other (See Comments)    Reaction not cited   Darvocet [Propoxyphene N-Acetaminophen ] Other (See Comments)    Reaction not cited   Latex Other (See Comments)    Reaction not cited   Silicone Other (See Comments)    Reaction not cited   Sulfa Antibiotics Hives   Tape Other (See Comments)    Reaction not cited   Meloxicam Rash       Latest Ref Rng & Units 01/01/2024    2:09 AM 12/31/2023    5:44 AM 12/30/2023    4:09 AM  CBC  WBC 4.0 - 10.5 K/uL 7.8  7.5  8.5   Hemoglobin 12.0 - 15.0 g/dL 9.9  89.3  9.7   Hematocrit 36.0 - 46.0 % 29.6  32.2  30.1   Platelets 150 - 400 K/uL 173  189  211       CMP     Component Value Date/Time   NA 135 03/11/2024 1547   NA 141 05/24/2014 1903   K 4.4 03/11/2024 1547   K 3.8 05/24/2014 1903   CL 95 (L) 03/11/2024 1547   CL 106 05/24/2014 1903   CO2 27 03/11/2024 1547   CO2 31 05/24/2014 1903   GLUCOSE 85 03/11/2024 1547   GLUCOSE 79 01/02/2024 0403   GLUCOSE 100 (H) 05/24/2014 1903   BUN 8 03/11/2024 1547   BUN 7 05/24/2014 1903   CREATININE 0.66 03/11/2024 1547   CREATININE 0.80 05/24/2014 1903   CALCIUM  8.5 (L) 03/11/2024 1547   CALCIUM  8.9 05/24/2014 1903   PROT 6.7 12/29/2023 0020    PROT 7.7 05/24/2014 1903   ALBUMIN 2.0 (L) 12/29/2023 0020   ALBUMIN 3.4 05/24/2014 1903   AST 29 12/29/2023 0020   AST 24 05/24/2014 1903   ALT 13 12/29/2023 0020   ALT 28 05/24/2014 1903   ALKPHOS 118 12/29/2023 0020   ALKPHOS 98 05/24/2014 1903   BILITOT 0.8 12/29/2023 0020   BILITOT 0.2 05/24/2014 1903   EGFR 102 03/11/2024 1547   GFRNONAA >60 01/02/2024 0403   GFRNONAA >60 05/24/2014 1903   GFRNONAA >60 10/13/2013 1143     No results found.     Assessment & Plan:   1. Lymphedema (Primary) Patient is seen for evaluation of leg swelling. The patient first noticed the swelling remotely but is now concerned because of a significant increase in the overall edema. The swelling is associated with significant pain.  There has been an increasing amount of  discoloration noted by the patient. The patient notes that in the morning the legs are improved but they steadily worsened throughout the course of the day. Elevation seems to make the swelling of the legs better, dependency makes them much worse.   There is a history of ulcerations associated with the swelling prior.   The patient denies  any recent changes in their medications.  The patient has not been wearing graduated compression and not using lymphedema pump.  The patient has no had any past angiography, interventions or vascular surgery.  The patient denies a history of DVT or PE. There is no prior history of phlebitis. There is a history of primary lymphedema.  There is no history of radiation treatment to the groin or pelvis No history of malignancies. No history of trauma or groin or pelvic surgery. No history of foreign travel or parasitic infections area   We will place the patient in [facility] wraps today. She has home health therapy that will come and change her wraps weekly. Patient wishes to follow up in 3 months for re-evaluation.   2. Gastroesophageal reflux disease, unspecified whether esophagitis  present Continue PPI as already ordered, this medication has been reviewed and there are no changes at this time.  Avoidence of caffeine and alcohol  Moderate elevation of the head of the bed   3. Mild intermittent asthma, unspecified whether complicated Continue pulmonary medications and aerosols as already ordered, these medications have been reviewed and there are no changes at this time.    Current Outpatient Medications on File Prior to Visit  Medication Sig Dispense Refill   albuterol  (VENTOLIN  HFA) 108 (90 Base) MCG/ACT inhaler Inhale 2 puffs into the lungs. Take two (2) puffs by mouth every 4 to 6 hours as needed for wheezing or shortness of breath     AMBULATORY NON FORMULARY MEDICATION Knee-high, medium compression, graduated compression stockings. Apply to lower extremities. [email or web address], Zippered Compression Stockings, medium circ, long length 1 each 0   apixaban  (ELIQUIS ) 5 MG TABS tablet Take 1 tablet (5 mg total) by mouth 2 (two) times daily. 60 tablet 5   bisacodyl  (DULCOLAX) 5 MG EC tablet Take 1 tablet (5 mg total) by mouth daily as needed for moderate constipation. 30 tablet 0   doxycycline  (VIBRAMYCIN ) 100 MG capsule Take 1 capsule (100 mg total) by mouth 2 (two) times daily. 14 capsule 0   DULoxetine  (CYMBALTA ) 60 MG capsule Take 1 capsule (60 mg total) by mouth daily. 30 capsule 2   levETIRAcetam  (KEPPRA ) 750 MG tablet Take 2 tablets (1,500 mg total) by mouth 2 (two) times daily. 120 tablet 1   megestrol  (MEGACE ) 400 MG/10ML suspension Take 10 mLs (400 mg total) by mouth daily. 300 mL 2   Melatonin 10 MG TABS Take 10 mg by mouth at bedtime.     mirtazapine  (REMERON ) 7.5 MG tablet Take 1 tablet (7.5 mg total) by mouth at bedtime. 30 tablet 2   morphine  (MSIR) 15 MG tablet Take 1 tablet (15 mg total) by mouth every 8 (eight) hours as needed for severe pain (pain score 7-10).     NON FORMULARY Apply 1 Application topically 3 (three) times daily as needed. Rock  Salt Cream (Similar to [employer])     ondansetron  (ZOFRAN ) 4 MG tablet Take 1 tablet (4 mg total) by mouth every 8 (eight) hours as needed for nausea or vomiting. 30 tablet 1   pantoprazole  (PROTONIX ) 40 MG tablet Take 1 tablet (40 mg total) by mouth daily. 90 tablet 1   carbamazepine  (TEGRETOL  XR) 100 MG 12 hr tablet Take 1 tablet (100 mg total) by mouth 2 (two) times daily. 60 tablet 2   gabapentin  (NEURONTIN ) 300 MG capsule Take 1 capsule (300 mg total) by mouth 4 (four) times daily. 120 capsule 0   lamoTRIgine  (LAMICTAL ) 25 MG tablet  Take 2 tablets (50 mg total) by mouth 2 (two) times daily. 60 tablet 1   No current facility-administered medications on file prior to visit.    There are no Patient Instructions on file for this visit. No follow-ups on file.   Gwendlyn JONELLE Shank, NP

## 2024-04-01 ENCOUNTER — Telehealth: Payer: Self-pay | Admitting: Family Medicine

## 2024-04-01 NOTE — Telephone Encounter (Signed)
 Copied from CRM [id number]. Topic: Clinical - Home Health Verbal Orders >> Apr 01, 2024 11:51 AM Avram MATSU wrote: Caller/Agency: Freddy Rushing Number: [phone] Service Requested: Skilled Nursing Frequency: 2*9 for wound care Any new concerns about the patient? Yes weakness Please call Freddy [phone]   ----------------------------------------------------------------------- From previous Reason for Contact - Order For Equipment (DME): Reason for CRM:

## 2024-04-01 NOTE — Telephone Encounter (Signed)
 Verbal orders given

## 2024-04-02 ENCOUNTER — Telehealth (INDEPENDENT_AMBULATORY_CARE_PROVIDER_SITE_OTHER): Payer: Self-pay

## 2024-04-02 NOTE — Telephone Encounter (Signed)
 On yesterday [facility] case manager Leita left a message requesting for prescription for thigh high compression to sent to the patient. The compression rx will be mailed to the mailed to the patient. I will attempt to contact the patient to notify to expecting for compression

## 2024-04-03 ENCOUNTER — Other Ambulatory Visit: Payer: Self-pay

## 2024-04-03 ENCOUNTER — Ambulatory Visit: Payer: Self-pay

## 2024-04-03 ENCOUNTER — Inpatient Hospital Stay
Admission: EM | Admit: 2024-04-03 | Discharge: 2024-04-06 | DRG: 292 | Disposition: A | Discharge status: Home Health | Attending: Student | Admitting: Student

## 2024-04-03 ENCOUNTER — Emergency Department

## 2024-04-03 DIAGNOSIS — Z881 Allergy status to other antibiotic agents status: Secondary | ICD-10-CM

## 2024-04-03 DIAGNOSIS — G40909 Epilepsy, unspecified, not intractable, without status epilepticus: Secondary | ICD-10-CM | POA: Diagnosis present

## 2024-04-03 DIAGNOSIS — Z96649 Presence of unspecified artificial hip joint: Secondary | ICD-10-CM | POA: Diagnosis present

## 2024-04-03 DIAGNOSIS — E440 Moderate protein-calorie malnutrition: Secondary | ICD-10-CM | POA: Insufficient documentation

## 2024-04-03 DIAGNOSIS — Z91040 Latex allergy status: Secondary | ICD-10-CM

## 2024-04-03 DIAGNOSIS — E876 Hypokalemia: Principal | ICD-10-CM | POA: Diagnosis present

## 2024-04-03 DIAGNOSIS — I509 Heart failure, unspecified: Secondary | ICD-10-CM

## 2024-04-03 DIAGNOSIS — Z86711 Personal history of pulmonary embolism: Secondary | ICD-10-CM

## 2024-04-03 DIAGNOSIS — G8929 Other chronic pain: Secondary | ICD-10-CM | POA: Diagnosis present

## 2024-04-03 DIAGNOSIS — I878 Other specified disorders of veins: Secondary | ICD-10-CM | POA: Diagnosis present

## 2024-04-03 DIAGNOSIS — Z7960 Long term (current) use of unspecified immunomodulators and immunosuppressants: Secondary | ICD-10-CM

## 2024-04-03 DIAGNOSIS — E639 Nutritional deficiency, unspecified: Secondary | ICD-10-CM | POA: Diagnosis present

## 2024-04-03 DIAGNOSIS — M549 Dorsalgia, unspecified: Secondary | ICD-10-CM | POA: Diagnosis present

## 2024-04-03 DIAGNOSIS — E8809 Other disorders of plasma-protein metabolism, not elsewhere classified: Secondary | ICD-10-CM | POA: Diagnosis present

## 2024-04-03 DIAGNOSIS — Z90710 Acquired absence of both cervix and uterus: Secondary | ICD-10-CM

## 2024-04-03 DIAGNOSIS — Z981 Arthrodesis status: Secondary | ICD-10-CM

## 2024-04-03 DIAGNOSIS — R600 Localized edema: Secondary | ICD-10-CM | POA: Diagnosis not present

## 2024-04-03 DIAGNOSIS — J9611 Chronic respiratory failure with hypoxia: Secondary | ICD-10-CM | POA: Diagnosis present

## 2024-04-03 DIAGNOSIS — Z9981 Dependence on supplemental oxygen: Secondary | ICD-10-CM

## 2024-04-03 DIAGNOSIS — Z833 Family history of diabetes mellitus: Secondary | ICD-10-CM

## 2024-04-03 DIAGNOSIS — Z8249 Family history of ischemic heart disease and other diseases of the circulatory system: Secondary | ICD-10-CM

## 2024-04-03 DIAGNOSIS — I5030 Unspecified diastolic (congestive) heart failure: Principal | ICD-10-CM | POA: Diagnosis present

## 2024-04-03 DIAGNOSIS — E785 Hyperlipidemia, unspecified: Secondary | ICD-10-CM | POA: Diagnosis present

## 2024-04-03 DIAGNOSIS — J45909 Unspecified asthma, uncomplicated: Secondary | ICD-10-CM | POA: Diagnosis present

## 2024-04-03 DIAGNOSIS — Z7901 Long term (current) use of anticoagulants: Secondary | ICD-10-CM

## 2024-04-03 DIAGNOSIS — M797 Fibromyalgia: Secondary | ICD-10-CM | POA: Diagnosis present

## 2024-04-03 DIAGNOSIS — Z886 Allergy status to analgesic agent status: Secondary | ICD-10-CM

## 2024-04-03 DIAGNOSIS — I959 Hypotension, unspecified: Secondary | ICD-10-CM | POA: Diagnosis present

## 2024-04-03 DIAGNOSIS — F17210 Nicotine dependence, cigarettes, uncomplicated: Secondary | ICD-10-CM | POA: Diagnosis present

## 2024-04-03 DIAGNOSIS — Z882 Allergy status to sulfonamides status: Secondary | ICD-10-CM

## 2024-04-03 DIAGNOSIS — Z85828 Personal history of other malignant neoplasm of skin: Secondary | ICD-10-CM

## 2024-04-03 DIAGNOSIS — Z888 Allergy status to other drugs, medicaments and biological substances status: Secondary | ICD-10-CM

## 2024-04-03 DIAGNOSIS — Z79899 Other long term (current) drug therapy: Secondary | ICD-10-CM

## 2024-04-03 DIAGNOSIS — Z885 Allergy status to narcotic agent status: Secondary | ICD-10-CM

## 2024-04-03 DIAGNOSIS — K861 Other chronic pancreatitis: Secondary | ICD-10-CM | POA: Diagnosis present

## 2024-04-03 DIAGNOSIS — L97919 Non-pressure chronic ulcer of unspecified part of right lower leg with unspecified severity: Secondary | ICD-10-CM | POA: Diagnosis present

## 2024-04-03 LAB — CBC
HCT: 33.6 % — ABNORMAL LOW (ref 36.0–46.0)
Hemoglobin: 11.3 g/dL — ABNORMAL LOW (ref 12.0–15.0)
MCH: 34.2 pg — ABNORMAL HIGH (ref 26.0–34.0)
MCHC: 33.6 g/dL (ref 30.0–36.0)
MCV: 101.8 fL — ABNORMAL HIGH (ref 80.0–100.0)
Platelets: 227 K/uL (ref 150–400)
RBC: 3.3 MIL/uL — ABNORMAL LOW (ref 3.87–5.11)
RDW: 13.8 % (ref 11.5–15.5)
WBC: 7.4 K/uL (ref 4.0–10.5)
nRBC: 0 % (ref 0.0–0.2)

## 2024-04-03 LAB — COMPREHENSIVE METABOLIC PANEL WITH GFR
ALT: 15 U/L (ref 0–44)
AST: 32 U/L (ref 15–41)
Albumin: 1.9 g/dL — ABNORMAL LOW (ref 3.5–5.0)
Alkaline Phosphatase: 135 U/L — ABNORMAL HIGH (ref 38–126)
Anion gap: 8 (ref 5–15)
BUN: 6 mg/dL (ref 6–20)
CO2: 30 mmol/L (ref 22–32)
Calcium: 7.2 mg/dL — ABNORMAL LOW (ref 8.9–10.3)
Chloride: 97 mmol/L — ABNORMAL LOW (ref 98–111)
Creatinine, Ser: 0.56 mg/dL (ref 0.44–1.00)
GFR, Estimated: >60 mL/min (ref >60)
Glucose, Bld: 92 mg/dL (ref 70–99)
Potassium: 2.7 mmol/L — CL (ref 3.5–5.1)
Sodium: 135 mmol/L (ref 135–145)
Total Bilirubin: 0.7 mg/dL (ref 0.0–1.2)
Total Protein: 5.1 g/dL — ABNORMAL LOW (ref 6.5–8.1)

## 2024-04-03 LAB — BRAIN NATRIURETIC PEPTIDE: B Natriuretic Peptide: 911 pg/mL — ABNORMAL HIGH (ref 0.0–100.0)

## 2024-04-03 MED ORDER — LAMOTRIGINE 100 MG PO TABS
50.0000 mg | ORAL_TABLET | Freq: Two times a day (BID) | ORAL | Status: DC
  Administered 2024-04-03 – 2024-04-06 (×6): 50 mg via ORAL
  Filled 2024-04-03 (×2): qty 1
  Filled 2024-04-03: qty 2
  Filled 2024-04-03: qty 1
  Filled 2024-04-03: qty 2
  Filled 2024-04-03: qty 1

## 2024-04-03 MED ORDER — POTASSIUM CHLORIDE 10 MEQ/100ML IV SOLN
10.0000 meq | Freq: Once | INTRAVENOUS | Status: AC
  Administered 2024-04-03: 10 meq via INTRAVENOUS
  Filled 2024-04-03: qty 100

## 2024-04-03 MED ORDER — BISACODYL 5 MG PO TBEC
5.0000 mg | DELAYED_RELEASE_TABLET | Freq: Every day | ORAL | Status: DC | PRN
  Administered 2024-04-05: 5 mg via ORAL
  Filled 2024-04-03: qty 1

## 2024-04-03 MED ORDER — ALBUTEROL SULFATE (2.5 MG/3ML) 0.083% IN NEBU: 2.5000 mg | INHALATION_SOLUTION | RESPIRATORY_TRACT | Status: DC | PRN

## 2024-04-03 MED ORDER — LEVETIRACETAM 500 MG PO TABS
1500.0000 mg | ORAL_TABLET | Freq: Two times a day (BID) | ORAL | Status: DC
  Administered 2024-04-03 – 2024-04-06 (×6): 1500 mg via ORAL
  Filled 2024-04-03 (×6): qty 3

## 2024-04-03 MED ORDER — POTASSIUM CHLORIDE CRYS ER 20 MEQ PO TBCR
40.0000 meq | EXTENDED_RELEASE_TABLET | Freq: Once | ORAL | Status: AC
  Administered 2024-04-03: 40 meq via ORAL
  Filled 2024-04-03: qty 2

## 2024-04-03 MED ORDER — PANTOPRAZOLE SODIUM 40 MG PO TBEC
40.0000 mg | DELAYED_RELEASE_TABLET | Freq: Every day | ORAL | Status: DC
  Administered 2024-04-04 – 2024-04-06 (×3): 40 mg via ORAL
  Filled 2024-04-03 (×3): qty 1

## 2024-04-03 MED ORDER — GABAPENTIN 300 MG PO CAPS
300.0000 mg | ORAL_CAPSULE | Freq: Four times a day (QID) | ORAL | Status: DC
  Administered 2024-04-03 – 2024-04-06 (×10): 300 mg via ORAL
  Filled 2024-04-03 (×10): qty 1

## 2024-04-03 MED ORDER — ACETAMINOPHEN 325 MG PO TABS
650.0000 mg | ORAL_TABLET | Freq: Four times a day (QID) | ORAL | Status: DC | PRN
  Administered 2024-04-04 – 2024-04-06 (×3): 650 mg via ORAL
  Filled 2024-04-03 (×3): qty 2

## 2024-04-03 MED ORDER — MORPHINE SULFATE 15 MG PO TABS
15.0000 mg | ORAL_TABLET | Freq: Three times a day (TID) | ORAL | Status: DC | PRN
  Administered 2024-04-04 – 2024-04-06 (×5): 15 mg via ORAL
  Filled 2024-04-03 (×6): qty 1

## 2024-04-03 MED ORDER — FUROSEMIDE 10 MG/ML IJ SOLN
40.0000 mg | Freq: Two times a day (BID) | INTRAMUSCULAR | Status: DC
  Administered 2024-04-04 – 2024-04-06 (×5): 40 mg via INTRAVENOUS
  Filled 2024-04-03 (×5): qty 4

## 2024-04-03 MED ORDER — ONDANSETRON HCL 4 MG PO TABS: 4.0000 mg | ORAL_TABLET | Freq: Four times a day (QID) | ORAL | Status: DC | PRN

## 2024-04-03 MED ORDER — ONDANSETRON HCL 4 MG/2ML IJ SOLN
4.0000 mg | Freq: Four times a day (QID) | INTRAMUSCULAR | Status: DC | PRN
Start: 2024-04-03 — End: 2024-04-06
  Administered 2024-04-04: 4 mg via INTRAVENOUS
  Filled 2024-04-03: qty 2

## 2024-04-03 MED ORDER — ACETAMINOPHEN 650 MG RE SUPP: 650.0000 mg | Freq: Four times a day (QID) | RECTAL | Status: DC | PRN

## 2024-04-03 MED ORDER — POTASSIUM CHLORIDE 10 MEQ/100ML IV SOLN
10.0000 meq | INTRAVENOUS | Status: AC
  Administered 2024-04-03 – 2024-04-04 (×3): 10 meq via INTRAVENOUS
  Filled 2024-04-03 (×2): qty 100

## 2024-04-03 MED ORDER — MIRTAZAPINE 15 MG PO TABS
7.5000 mg | ORAL_TABLET | Freq: Every day | ORAL | Status: DC
Start: 2024-04-03 — End: 2024-04-06
  Administered 2024-04-03 – 2024-04-05 (×3): 7.5 mg via ORAL
  Filled 2024-04-03 (×3): qty 1

## 2024-04-03 MED ORDER — FUROSEMIDE 10 MG/ML IJ SOLN
40.0000 mg | Freq: Once | INTRAMUSCULAR | Status: AC
  Administered 2024-04-03: 40 mg via INTRAVENOUS
  Filled 2024-04-03: qty 4

## 2024-04-03 MED ORDER — APIXABAN 5 MG PO TABS
5.0000 mg | ORAL_TABLET | Freq: Two times a day (BID) | ORAL | Status: DC
  Administered 2024-04-03 – 2024-04-06 (×6): 5 mg via ORAL
  Filled 2024-04-03 (×6): qty 1

## 2024-04-03 MED ORDER — CARBAMAZEPINE ER 100 MG PO TB12
100.0000 mg | ORAL_TABLET | Freq: Two times a day (BID) | ORAL | Status: DC
  Administered 2024-04-04 – 2024-04-06 (×6): 100 mg via ORAL
  Filled 2024-04-03 (×8): qty 1

## 2024-04-03 MED ORDER — DULOXETINE HCL 30 MG PO CPEP
60.0000 mg | ORAL_CAPSULE | Freq: Every day | ORAL | Status: DC
  Administered 2024-04-04 – 2024-04-06 (×3): 60 mg via ORAL
  Filled 2024-04-03 (×3): qty 2
  Filled 2024-04-03: qty 1

## 2024-04-03 MED ORDER — POTASSIUM CHLORIDE CRYS ER 20 MEQ PO TBCR
20.0000 meq | EXTENDED_RELEASE_TABLET | Freq: Every day | ORAL | Status: DC
  Administered 2024-04-03: 20 meq via ORAL
  Filled 2024-04-03 (×2): qty 1

## 2024-04-03 NOTE — ED Triage Notes (Signed)
 Pt to ED via POV from home. Pt reports home health aid recommended her to come in due to increase bilateral leg swelling for a few weeks. Pt had weeping to left leg. Pt has had 3lb wt gain in 1 day. Pt gets weighed daily. Pt denies SOB or CP. Pt reports increased to diuretic and K+ pill.

## 2024-04-03 NOTE — Assessment & Plan Note (Signed)
 Chronic pain on opiates Continue Cymbalta  New opiates pending med rec

## 2024-04-03 NOTE — ED Provider Notes (Signed)
 [facility] Provider Note    Event Date/Time   First MD Initiated Contact with Patient 04/03/24 1921     (approximate)   History   Leg Swelling   HPI  Az[facility] is a 58 y.o. female with history of PE on Eliquis , lower extremity edema, presenting with lower extremity swelling.  States that last several weeks, she has noted increased bilateral lower extremity swelling, has weeping to her legs.  Also a 3 pound weight gain in the last day.  She denies any chest pain or shortness of breath, is on furosemide  as well as a potassium pill.  Her home health aide recommended coming in for evaluation.  States that she fell 2 weeks ago, had some aching to her left hip but able to weight-bear with a walker.  No new weakness or numbness.  States that her hip is not hurting at this time.  Independent history from mom, patient has had home health come over multiple times this week to help redress her wounds.  Has noted that the swelling has worsened despite the Lasix .  States that she felt like her right lower extremity was slightly larger than the left.  Feels that patient is not able to ambulate as well, patient uses a walker at baseline.  On independent review, she is seen by primary care doctor on 6 August for leg swelling and wound care management as well as management of fluid retention.  Been on 40 mg Lasix  was continued on the Lasix  as well as had potassium chloride  added.  Was referred to vascular surgery for venous stasis.  Had her wounds looked at, has 2 shallow ulcers on her right leg and 2 on the left no surrounding erythema, had some mucoid drainage and fibrotic debris at the base of her wounds.  Note stated to use until debriding gel with home health.  Was also noted to have a subacute fracture on the left, had an MRI ordered by primary care.     Physical Exam   Triage Vital Signs: ED Triage Vitals [04/03/24 1733]  Encounter Vitals Group     BP  105/65     Girls Systolic BP Percentile      Girls Diastolic BP Percentile      Boys Systolic BP Percentile      Boys Diastolic BP Percentile      Pulse Rate 84     Resp 18     Temp 98.8 F (37.1 C)     Temp Source Oral     SpO2 98 %     Weight 141 lb (64 kg)     Height 5' 5 (1.651 m)     Head Circumference      Peak Flow      Pain Score 0     Pain Loc      Pain Education      Exclude from Growth Chart     Most recent vital signs: Vitals:   04/03/24 1733  BP: 105/65  Pulse: 84  Resp: 18  Temp: 98.8 F (37.1 C)  SpO2: 98%     General: Awake, no distress.  CV:  Good peripheral perfusion.  Resp:  Normal effort.  Abd:  No distention.  Soft nontender Other:  Full range of bilateral lower extremities are intact without bony tenderness, she does have bilateral lower extremity swelling, right lower extremity is just slightly larger than the left.  She has intact DP pulses bilaterally.  No ulcerations noted, no open wounds, no focal induration, fluctuance, purulent drainage.  She has no focal weakness or numbness.   ED Results / Procedures / Treatments   Labs (all labs ordered are listed, but only abnormal results are displayed) Labs Reviewed  CBC - Abnormal; Notable for the following components:      Result Value   RBC 3.30 (*)    Hemoglobin 11.3 (*)    HCT 33.6 (*)    MCV 101.8 (*)    MCH 34.2 (*)    All other components within normal limits  COMPREHENSIVE METABOLIC PANEL WITH GFR - Abnormal; Notable for the following components:   Potassium 2.7 (*)    Chloride 97 (*)    Calcium  7.2 (*)    Total Protein 5.1 (*)    Albumin 1.9 (*)    Alkaline Phosphatase 135 (*)    All other components within normal limits  BRAIN NATRIURETIC PEPTIDE - Abnormal; Notable for the following components:   B Natriuretic Peptide 911.0 (*)    All other components within normal limits     EKG  EKG shows, sinus rhythm, rate 78, normal QS, normal QTc, no obvious ischemic ST  elevation, T wave flattening in 3, lateral leads, not significantly changed compared to prior   RADIOLOGY On my independent interpretation, chest x-ray without obvious consolidation   PROCEDURES:  Critical Care performed: No  Procedures   MEDICATIONS ORDERED IN ED: Medications  potassium chloride  10 mEq in 100 mL IVPB (10 mEq Intravenous New Bag/Given 04/03/24 2056)  furosemide  (LASIX ) injection 40 mg (40 mg Intravenous Given 04/03/24 2049)     IMPRESSION / MDM / ASSESSMENT AND PLAN / ED COURSE  I reviewed the triage vital signs and the nursing notes.                              Differential diagnosis includes, but is not limited to, venous stasis, lymphedema, CHF, DVT.  Labs, chest x-ray, BNP were ordered in triage, will add on a DVT study.  Patient's presentation is most consistent with acute presentation with potential threat to life or bodily function.  Independent interpretation of labs and imaging below.  Her BNP is newly elevated, will give her some IV Lasix  here as well as potassium repletion.  Given her worsening symptoms despite p.o. diuretics as well as p.o. potassium, she will need to be admitted for further management, will likely need inpatient echo as well.  Consulted hospitalist was agreeable with the plan for admission and will evaluate the patient.  She is admitted.  The patient is on the cardiac monitor to evaluate for evidence of arrhythmia and/or significant heart rate changes.   Clinical Course as of 04/03/24 2115  Fri Apr 03, 2024  1926 DG Chest 2 View IMPRESSION: 1. RIGHT middle lobe atelectasis. 2. 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   [TT]  2106 US  Venous Img Lower Bilateral (DVT) IMPRESSION: Limited evaluation of the BILATERAL posterior tibial and BILATERAL peroneal veins, without evidence of deep venous thrombosis in either lower extremity.   [TT]  2115 Independent review of labs, hypokalemia noted, no AKI, no  leukocytosis, BNP is elevated compared to prior. [TT]    Clinical Course User Index [TT] Waymond, Lorelle Cummins, MD     FINAL CLINICAL IMPRESSION(S) / ED DIAGNOSES   Final diagnoses:  Peripheral edema  Hypokalemia     Rx / DC Orders   ED Discharge  Orders     None        Note:  This document was prepared using Dragon voice recognition software and may include unintentional dictation errors.    Waymond Lorelle Cummins, MD 04/03/24 2115

## 2024-04-03 NOTE — ED Notes (Signed)
 Pt given diet shasta twist, saltine crackers, and graham crackers at this time.

## 2024-04-03 NOTE — Assessment & Plan Note (Signed)
 Replete and monitor Pharmacy consulted to assist

## 2024-04-03 NOTE — Assessment & Plan Note (Signed)
 History of asthma Not acutely exacerbated Albuterol  as needed Continue supplemental oxygen 

## 2024-04-03 NOTE — Hospital Course (Signed)
 Abigail [name]

## 2024-04-03 NOTE — Assessment & Plan Note (Signed)
 Hypoalbuminemia, albumin 1.9 Consider nutritionist consult Could be contributed to lower extremity edema

## 2024-04-03 NOTE — H&P (Signed)
 History and Physical    Patient: Abigail [name] FMW:[id number] DOB: [birth year] DOA: 04/03/2024 DOS: the patient was seen and examined on 04/03/2024 PCP: [name], Susan K, MD  Patient coming from: Home  Chief Complaint:  Chief Complaint  Patient presents with   Leg Swelling    HPI: Abigail [name] is a 58 y.o. female with medical history significant for chronic pancreatitis/pancreatic pseudocyst, chronic back pain pain on opiates, opioid-induced constipation , seizure disorder, chronic hypoxic respiratory failure on  O2 at 2 L,,frequent hospitalizations most recently from 5/10-5/15/25 with encephalopathy of multifactorial etiology, with incidental finding of acute subsegmental PE, started on [name], being admitted with possible new onset CHF with mainly lower extremity edema, not responding to outpatient titration of Lasix .  Symptoms started 2 weeks prior with progressively worsening lower extremity edema to where it is difficult to ambulate and the leg is now weeping.  She has no shortness of breath or chest pain.  Has no cough fever or chills.  No abdominal distention or fullness.   Denies pain in the legs.  Is compliant with anticoagulation. In the ED, BP 105/65 with otherwise normal vitals. Labs notable for BNP 911, troponin not done.  CBC unremarkable except for hemoglobin of 11.3 with MCV 101.  Chemistries notable for potassium 2.7 and low albumin of 1.9.EKG Showed sinus at 76 with nonspecific ST-T wave changes. Chest x-ray was nonacute Bilateral lower extremity venous ultrasound without evidence of DVT  Patient treated with IV Lasix  40 mg and started on IV potassium riders.  Admission requested.    Past Medical History:  Diagnosis Date   Anxiety    Arthritis    Back pain    Basal cell carcinoma 10/04/2021   R axilla - needs excised 11/28/21   Basal cell carcinoma 10/04/2021   L antecubital excised 11/14/21   Diarrhea 11/12/2016   Fibromyalgia    Generalized  abdominal pain 11/12/2016   H. pylori infection    Hyperlipidemia    IBS (irritable bowel syndrome)    Infectious colitis 04/29/2016   Migraines    Moderate dehydration 04/29/2016   Muscle pain    Opioid overdose (HCC) 12/07/2022   Reflux    Seizures (HCC)    Unexplained weight loss 11/12/2016   Past Surgical History:  Procedure Laterality Date   ABDOMINAL HYSTERECTOMY     APPENDECTOMY  2009   C5 FUSION     C6 FUSION     C7 FUSION     COLONOSCOPY  02/2006   COLONOSCOPY WITH PROPOFOL  N/A 12/25/2016   Procedure: COLONOSCOPY WITH PROPOFOL ;  Surgeon: Dessa Reyes ORN, MD;  Location: [facility] ENDOSCOPY;  Service: Endoscopy;  Laterality: N/A;   ESOPHAGOGASTRODUODENOSCOPY (EGD) WITH PROPOFOL  N/A 12/25/2016   Procedure: ESOPHAGOGASTRODUODENOSCOPY (EGD) WITH PROPOFOL ;  Surgeon: Dessa Reyes ORN, MD;  Location: [facility] ENDOSCOPY;  Service: Endoscopy;  Laterality: N/A;   FOOT SURGERY     HIP ARTHROPLASTY     L4 FUSION     L5 FUSION     S1 FUSION     Social History:  reports that she has been smoking cigarettes. She has been exposed to tobacco smoke. She has never used smokeless tobacco. She reports that she does not currently use alcohol. She reports that she does not currently use drugs.  Allergies  Allergen Reactions   Nsaids Hives   Tapentadol Swelling, Rash and Other (See Comments)    Nucynta- Made her deathly sick   Cephalexin  Other (See Comments)    Reaction  not cited   Codeine Other (See Comments)    Reaction not cited   Darvocet [Propoxyphene N-Acetaminophen ] Other (See Comments)    Reaction not cited   Latex Other (See Comments)    Reaction not cited   Silicone Other (See Comments)    Reaction not cited   Sulfa Antibiotics Hives   Tape Other (See Comments)    Reaction not cited   Meloxicam Rash    Family History  Problem Relation Age of Onset   Diabetes Father    Hypertension Father     Prior to Admission medications   Medication Sig Start Date End Date Taking?  Authorizing Provider  albuterol  (VENTOLIN  HFA) 108 (90 Base) MCG/ACT inhaler Inhale 2 puffs into the lungs. Take two (2) puffs by mouth every 4 to 6 hours as needed for wheezing or shortness of breath    [provider]  AMBULATORY NON FORMULARY MEDICATION Knee-high, medium compression, graduated compression stockings. Apply to lower extremities. [email or web address], Zippered Compression Stockings, medium circ, long length 03/04/24   [name], Susan K, MD  apixaban  (ELIQUIS ) 5 MG TABS tablet Take 1 tablet (5 mg total) by mouth 2 (two) times daily. 03/04/24   [name], Susan K, MD  bisacodyl  (DULCOLAX) 5 MG EC tablet Take 1 tablet (5 mg total) by mouth daily as needed for moderate constipation. 10/04/23   Maree Hue, MD  carbamazepine  (TEGRETOL  XR) 100 MG 12 hr tablet Take 1 tablet (100 mg total) by mouth 2 (two) times daily. 01/02/24 03/25/24  Wouk, Devaughn Sayres, MD  collagenase  (SANTYL ) 250 UNIT/GM ointment Apply 1 Application topically daily. 03/26/24   [name], Susan K, MD  doxycycline  (VIBRAMYCIN ) 100 MG capsule Take 1 capsule (100 mg total) by mouth 2 (two) times daily. 03/05/24   [name], Susan K, MD  DULoxetine  (CYMBALTA ) 60 MG capsule Take 1 capsule (60 mg total) by mouth daily. 01/17/24   [name], Susan K, MD  furosemide  (LASIX ) 40 MG tablet Take 1 tablet (40 mg total) by mouth daily. 03/25/24   [name], Susan K, MD  gabapentin  (NEURONTIN ) 300 MG capsule Take 1 capsule (300 mg total) by mouth 4 (four) times daily. 10/04/23 03/25/24  Maree Hue, MD  lamoTRIgine  (LAMICTAL ) 25 MG tablet Take 2 tablets (50 mg total) by mouth 2 (two) times daily. 01/02/24   Wouk, Devaughn Sayres, MD  levETIRAcetam  (KEPPRA ) 750 MG tablet Take 2 tablets (1,500 mg total) by mouth 2 (two) times daily. 01/02/24   Wouk, Devaughn Sayres, MD  megestrol  (MEGACE ) 400 MG/10ML suspension Take 10 mLs (400 mg total) by mouth daily. 02/11/24   [name], Susan K, MD  Melatonin 10 MG TABS Take 10 mg by mouth at bedtime.    [provider]   mirtazapine  (REMERON ) 7.5 MG tablet Take 1 tablet (7.5 mg total) by mouth at bedtime. 02/11/24   [name], Susan K, MD  morphine  (MSIR) 15 MG tablet Take 1 tablet (15 mg total) by mouth every 8 (eight) hours as needed for severe pain (pain score 7-10). 11/05/23   Jens Durand, MD  NON FORMULARY Apply 1 Application topically 3 (three) times daily as needed. Rock Salt Cream (Similar to [employer])    [provider]  ondansetron  (ZOFRAN ) 4 MG tablet Take 1 tablet (4 mg total) by mouth every 8 (eight) hours as needed for nausea or vomiting. 01/17/24   [name], Susan K, MD  pantoprazole  (PROTONIX ) 40 MG tablet Take 1 tablet (40 mg total) by mouth daily. 03/11/24   [name], Susan K, MD  potassium chloride  SA (KLOR-CON  M) 20 MEQ tablet Take 1 tablet (20 mEq total) by mouth daily. 03/25/24   [name], Devere POUR, MD    Physical Exam: Vitals:   04/03/24 1733  BP: 105/65  Pulse: 84  Resp: 18  Temp: 98.8 F (37.1 C)  TempSrc: Oral  SpO2: 98%  Weight: 64 kg  Height: 5' 5 (1.651 m)   Physical Exam Vitals and nursing note reviewed.  Constitutional:      General: She is not in acute distress. HENT:     Head: Normocephalic and atraumatic.  Cardiovascular:     Rate and Rhythm: Normal rate and regular rhythm.     Heart sounds: Normal heart sounds.  Pulmonary:     Effort: Pulmonary effort is normal.     Breath sounds: Normal breath sounds.  Abdominal:     Palpations: Abdomen is soft.     Tenderness: There is no abdominal tenderness.  Musculoskeletal:     Right lower leg: 3+ Edema present.     Left lower leg: 3+ Edema present.  Neurological:     Mental Status: Mental status is at baseline.     Labs on Admission: I have personally reviewed following labs and imaging studies  CBC: Recent Labs  Lab 04/03/24 1805  WBC 7.4  HGB 11.3*  HCT 33.6*  MCV 101.8*  PLT 227   Basic Metabolic Panel: Recent Labs  Lab 04/03/24 1805  NA 135  K 2.7*  CL 97*  CO2 30  GLUCOSE 92  BUN 6   CREATININE 0.56  CALCIUM  7.2*   GFR: Estimated Creatinine Clearance: 68.1 mL/min (by C-G formula based on SCr of 0.56 mg/dL). Liver Function Tests: Recent Labs  Lab 04/03/24 1805  AST 32  ALT 15  ALKPHOS 135*  BILITOT 0.7  PROT 5.1*  ALBUMIN 1.9*   No results for input(s): LIPASE, AMYLASE in the last 168 hours. No results for input(s): AMMONIA in the last 168 hours. Coagulation Profile: No results for input(s): INR, PROTIME in the last 168 hours. Cardiac Enzymes: No results for input(s): CKTOTAL, CKMB, CKMBINDEX, TROPONINI in the last 168 hours. BNP (last 3 results) No results for input(s): PROBNP in the last 8760 hours. HbA1C: No results for input(s): HGBA1C in the last 72 hours. CBG: No results for input(s): GLUCAP in the last 168 hours. Lipid Profile: No results for input(s): CHOL, HDL, LDLCALC, TRIG, CHOLHDL, LDLDIRECT in the last 72 hours. Thyroid  Function Tests: No results for input(s): TSH, T4TOTAL, FREET4, T3FREE, THYROIDAB in the last 72 hours. Anemia Panel: No results for input(s): VITAMINB12, FOLATE, FERRITIN, TIBC, IRON, RETICCTPCT in the last 72 hours. Urine analysis:    Component Value Date/Time   COLORURINE AMBER (A) 12/29/2023 0126   APPEARANCEUR CLEAR (A) 12/29/2023 0126   APPEARANCEUR Clear 05/24/2014 1903   LABSPEC 1.021 12/29/2023 0126   LABSPEC 1.015 05/24/2014 1903   PHURINE 6.0 12/29/2023 0126   GLUCOSEU NEGATIVE 12/29/2023 0126   GLUCOSEU Negative 05/24/2014 1903   HGBUR NEGATIVE 12/29/2023 0126   BILIRUBINUR NEGATIVE 12/29/2023 0126   BILIRUBINUR Negative 05/24/2014 1903   KETONESUR 5 (A) 12/29/2023 0126   PROTEINUR 30 (A) 12/29/2023 0126   NITRITE NEGATIVE 12/29/2023 0126   LEUKOCYTESUR NEGATIVE 12/29/2023 0126   LEUKOCYTESUR Negative 05/24/2014 1903    Radiological Exams on Admission: US  Venous Img Lower Bilateral (DVT) Result Date: 04/03/2024 CLINICAL DATA:  Bilateral lower  extremity swelling. EXAM: BILATERAL LOWER EXTREMITY VENOUS DOPPLER ULTRASOUND TECHNIQUE: Gray-scale sonography with graded compression, as well as  color Doppler and duplex ultrasound were performed to evaluate the lower extremity deep venous systems from the level of the common femoral vein and including the common femoral, femoral, profunda femoral, popliteal and calf veins including the posterior tibial, peroneal and gastrocnemius veins when visible. The superficial great saphenous vein was also interrogated. Spectral Doppler was utilized to evaluate flow at rest and with distal augmentation maneuvers in the common femoral, femoral and popliteal veins. COMPARISON:  Dec 31, 2023 FINDINGS: RIGHT LOWER EXTREMITY Common Femoral Vein: No evidence of thrombus. Normal compressibility, respiratory phasicity and response to augmentation. Saphenofemoral Junction: No evidence of thrombus. Normal compressibility and flow on color Doppler imaging. Profunda Femoral Vein: No evidence of thrombus. Normal compressibility and flow on color Doppler imaging. Femoral Vein: No evidence of thrombus. Normal compressibility, respiratory phasicity and response to augmentation. Popliteal Vein: No evidence of thrombus. Normal compressibility, respiratory phasicity and response to augmentation. Calf Veins: The RIGHT posterior tibial vein and RIGHT peroneal vein are poorly visualized. Superficial Great Saphenous Vein: No evidence of thrombus. Normal compressibility. Venous Reflux:  None. Other Findings:  None. LEFT LOWER EXTREMITY Common Femoral Vein: No evidence of thrombus. Normal compressibility, respiratory phasicity and response to augmentation. Saphenofemoral Junction: No evidence of thrombus. Normal compressibility and flow on color Doppler imaging. Profunda Femoral Vein: No evidence of thrombus. Normal compressibility and flow on color Doppler imaging. Femoral Vein: No evidence of thrombus. Normal compressibility, respiratory phasicity  and response to augmentation. Popliteal Vein: No evidence of thrombus. Normal compressibility, respiratory phasicity and response to augmentation. Calf Veins: The LEFT posterior tibial vein and LEFT peroneal vein are poorly visualized. Superficial Great Saphenous Vein: No evidence of thrombus. Normal compressibility. Venous Reflux:  None. Other Findings:  None. IMPRESSION: Limited evaluation of the BILATERAL posterior tibial and BILATERAL peroneal veins, without evidence of deep venous thrombosis in either lower extremity. Electronically Signed   By: Suzen Dials M.D.   On: 04/03/2024 20:57   DG Chest 2 View Result Date: 04/03/2024 CLINICAL DATA:  Leg swelling EXAM: CHEST - 2 VIEW COMPARISON:  None Available. FINDINGS: Normal cardiac silhouette. Band of atelectasis in the RIGHT middle lobe. 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 No acute findings. Anterior cervical fusion IMPRESSION: 1. RIGHT middle lobe atelectasis. 2. 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 Electronically Signed   By: Jackquline Boxer M.D.   On: 04/03/2024 19:19   Data Reviewed for HPI: Relevant notes from primary care and specialist visits, past discharge summaries as available in EHR, including Care Everywhere. Prior diagnostic testing as pertinent to current admission diagnoses Updated medications and problem lists for reconciliation ED course, including vitals, labs, imaging, treatment and response to treatment Triage notes, nursing and pharmacy notes and ED provider's notes Notable results as noted above in HPI      Assessment and Plan: * Bilateral lower extremity edema New onset CHF vs nutritional/hypoalbuminemia  History of lower extremity venous stasis Patient with progressive worsening of chronic lower extremity edema, weight gain of 3 pounds (CHF vs nutritional vs venous stasis) BNP over 900 but chest x-ray clear Lower extremity ultrasound negative for DVT Will  evaluate for CHF with echocardiogram Will check urine for protein IV Lasix  Daily weights with intake and output monitoring Keep legs elevated Consider nutritionist evaluation, consider albumin  History of subsegmental pulmonary embolism 12/2023 On anticoagulation with Eliquis , compliant Bilateral lower extremity venous ultrasound negative for DVT No acute issues suspected.  Hypokalemia Replete and monitor Pharmacy consulted to assist  Hypocalcemia  Calcium  6, albumin 1.9.  Corrected calcium  7.68 Calcium  gluconate ordered  Hypoalbuminemia Albumin 1.9 Urinalysis with spot urine to creatinine ratio to evaluate for nephrotic syndrome  Chronic respiratory failure with hypoxia (HCC) History of asthma Not acutely exacerbated Albuterol  as needed Continue supplemental oxygen   Epilepsy, (HCC) No concerns for seizure at this time Continue carbamazepine , gabapentin , lamotrigine  and levetiracetam      DVT prophylaxis: Eliquis   Consults: none  Advance Care Planning:   Code Status: Prior   Family Communication: none  Disposition Plan: Back to previous home environment  Severity of Illness: The appropriate patient status for this patient is OBSERVATION. Observation status is judged to be reasonable and necessary in order to provide the required intensity of service to ensure the patient's safety. The patient's presenting symptoms, physical exam findings, and initial radiographic and laboratory data in the context of their medical condition is felt to place them at decreased risk for further clinical deterioration. Furthermore, it is anticipated that the patient will be medically stable for discharge from the hospital within 2 midnights of admission.   Author: Delayne LULLA Solian, MD 04/03/2024 9:21 PM  For on call review [email or web address].

## 2024-04-03 NOTE — Progress Notes (Signed)
 PHARMACY CONSULT NOTE - FOLLOW UP  Pharmacy Consult for Electrolyte Monitoring and Replacement   Recent Labs: Potassium (mmol/L)  Date Value  04/03/2024 2.7 (LL)  05/24/2014 3.8   Magnesium  (mg/dL)  Date Value  94/84/7974 2.1   Calcium  (mg/dL)  Date Value  91/84/7974 7.2 (L)   Calcium , Total (mg/dL)  Date Value  89/94/7984 8.9   Albumin (g/dL)  Date Value  91/84/7974 1.9 (L)  05/24/2014 3.4   Phosphorus (mg/dL)  Date Value  96/82/7974 3.5   Sodium (mmol/L)  Date Value  04/03/2024 135  03/11/2024 135  05/24/2014 141     Assessment: 8/15 @ 1805:  K = 2.7                         Ca = 7.2,  Alb = 1.9,  Corrected Ca = 8.9   Goal of Therapy:  Electrolytes WNL   Plan:  KCl 10 mEq IV X 1 given in ED on 8/15 @ 2156. KCl 40 mEq PO X 1 ordered for 8/15 @ 2200. - will order additional KCl 10 mEq IV X 3 to start on 8/15 @ 2300. - will recheck electrolytes on 8/16 with AM labs  Carrson Lightcap D ,PharmD Clinical Pharmacist 04/03/2024 9:58 PM

## 2024-04-03 NOTE — Assessment & Plan Note (Addendum)
 On anticoagulation with Eliquis , compliant Bilateral lower extremity venous ultrasound negative for DVT No acute issues suspected.

## 2024-04-03 NOTE — Assessment & Plan Note (Addendum)
 New onset CHF vs nutritional/hypoalbuminemia  History of lower extremity venous stasis Patient with progressive worsening of chronic lower extremity edema, weight gain of 3 pounds (CHF vs nutritional vs venous stasis) BNP over 900 but chest x-ray clear Lower extremity ultrasound negative for DVT Will evaluate for CHF with echocardiogram Will check urine for protein IV Lasix  Daily weights with intake and output monitoring Keep legs elevated Consider nutritionist evaluation, consider albumin

## 2024-04-03 NOTE — Telephone Encounter (Signed)
 FYI Only or Action Required?: Action required by provider: update on patient condition.  Patient was last seen in primary care on 03/25/2024 by [name], Susan K, MD.  Called Nurse Triage reporting Fall.  Symptoms began yesterday.  Interventions attempted: Prescription medications: lasix .  Symptoms are: gradually worsening.  Triage Disposition: Go to ED Now (Notify PCP)  Patient/caregiver understands and will follow disposition?: UnsureCopied from CRM #[id number]. Topic: Clinical - Red Word Triage >> Apr 03, 2024  3:50 PM Zy'onna H wrote: Red Word that prompted transfer to Nurse Triage:  Abigail [name]  Patient has fallen and cut her knee(skin tear) Wraps are saturated - fully Wraps were changed Wednesday night due to patient not elevating her legs. Legs are continuous   Transfer to NT Reason for Disposition  Injury (or injuries) that need emergency care  Answer Assessment - Initial Assessment Questions Abigail from [facility] called to report fall/knee injury. [name] wrapped knee yesterday and rewrapped today. Bandages were soaked. Knee is draining when I take wraps off. Bilateral leg swelling. Pt has numerous sores on legs that are weeping. Abigail stated pt denied SOB. Abigail stated pt is always sleepy and thinks that is why pt falling.  Abigail has left but stated she will call pt to tell her to go ED. Abigail is asking office to call and follow up with pt to make sure she is going.        1. MECHANISM: How did the fall happen?     Fell while bending over 2. DOMESTIC VIOLENCE AND ELDER ABUSE SCREENING: Did you fall because someone pushed you or tried to hurt you? If Yes, ask: Are you safe now?     na 3. ONSET: When did the fall happen? (e.g., minutes, hours, or days ago)     yesterday 4. LOCATION: What part of the body hit the ground? (e.g., back, buttocks, head, hips, knees, hands, head, stomach)     Left knee 5. INJURY: Did you hurt (injure) yourself when  you fell? If Yes, ask: What did you injure? Tell me more about this? (e.g., body area; type of injury; pain severity)     yes 6. PAIN: Is there any pain? If Yes, ask: How bad is the pain? (e.g., Scale 0-10; or none, mild,      mild 7. SIZE: For cuts, bruises, or swelling, ask: How large is it? (e.g., inches or centimeters)      Knee is swollen  9. OTHER SYMPTOMS: Do you have any other symptoms? (e.g., dizziness, fever, weakness; new-onset or worsening).      denies 10. CAUSE: What do you think caused the fall (or falling)? (e.g., dizzy spell, tripped)       Not sure  Protocols used: Falls and [facility]

## 2024-04-03 NOTE — Assessment & Plan Note (Signed)
 No concerns for seizure at this time Continue carbamazepine , gabapentin , lamotrigine  and levetiracetam 

## 2024-04-03 NOTE — ED Notes (Addendum)
 K+ 2.7 MD Tan informed. Acuity changed to level 2

## 2024-04-03 NOTE — Assessment & Plan Note (Deleted)
 History of lower extremity venous stasis with ulcers Patient with progressive worsening of chronic lower extremity edema, weight gain of 3 pounds BNP over 900 but chest x-ray clear Will evaluate with echocardiogram IV Lasix  Daily weights with intake and output monitoring Keep legs elevated Daily wound care

## 2024-04-04 ENCOUNTER — Observation Stay (HOSPITAL_COMMUNITY)
Admit: 2024-04-04 | Discharge: 2024-04-04 | Disposition: A | Discharge status: Home / Self Care | Attending: Internal Medicine | Admitting: Internal Medicine

## 2024-04-04 DIAGNOSIS — Z7901 Long term (current) use of anticoagulants: Secondary | ICD-10-CM | POA: Diagnosis not present

## 2024-04-04 DIAGNOSIS — I959 Hypotension, unspecified: Secondary | ICD-10-CM | POA: Diagnosis present

## 2024-04-04 DIAGNOSIS — J9611 Chronic respiratory failure with hypoxia: Secondary | ICD-10-CM | POA: Diagnosis present

## 2024-04-04 DIAGNOSIS — G40909 Epilepsy, unspecified, not intractable, without status epilepticus: Secondary | ICD-10-CM | POA: Diagnosis present

## 2024-04-04 DIAGNOSIS — E876 Hypokalemia: Secondary | ICD-10-CM | POA: Diagnosis present

## 2024-04-04 DIAGNOSIS — Z79899 Other long term (current) drug therapy: Secondary | ICD-10-CM | POA: Diagnosis not present

## 2024-04-04 DIAGNOSIS — E8809 Other disorders of plasma-protein metabolism, not elsewhere classified: Secondary | ICD-10-CM

## 2024-04-04 DIAGNOSIS — R600 Localized edema: Secondary | ICD-10-CM | POA: Diagnosis not present

## 2024-04-04 DIAGNOSIS — L97919 Non-pressure chronic ulcer of unspecified part of right lower leg with unspecified severity: Secondary | ICD-10-CM | POA: Diagnosis present

## 2024-04-04 DIAGNOSIS — Z885 Allergy status to narcotic agent status: Secondary | ICD-10-CM | POA: Diagnosis not present

## 2024-04-04 DIAGNOSIS — Z8249 Family history of ischemic heart disease and other diseases of the circulatory system: Secondary | ICD-10-CM | POA: Diagnosis not present

## 2024-04-04 DIAGNOSIS — Z881 Allergy status to other antibiotic agents status: Secondary | ICD-10-CM | POA: Diagnosis not present

## 2024-04-04 DIAGNOSIS — Z886 Allergy status to analgesic agent status: Secondary | ICD-10-CM | POA: Diagnosis not present

## 2024-04-04 DIAGNOSIS — M797 Fibromyalgia: Secondary | ICD-10-CM | POA: Diagnosis present

## 2024-04-04 DIAGNOSIS — I5030 Unspecified diastolic (congestive) heart failure: Secondary | ICD-10-CM | POA: Diagnosis present

## 2024-04-04 DIAGNOSIS — E785 Hyperlipidemia, unspecified: Secondary | ICD-10-CM | POA: Diagnosis present

## 2024-04-04 DIAGNOSIS — I5031 Acute diastolic (congestive) heart failure: Secondary | ICD-10-CM

## 2024-04-04 DIAGNOSIS — J45909 Unspecified asthma, uncomplicated: Secondary | ICD-10-CM | POA: Diagnosis present

## 2024-04-04 DIAGNOSIS — F17210 Nicotine dependence, cigarettes, uncomplicated: Secondary | ICD-10-CM | POA: Diagnosis present

## 2024-04-04 DIAGNOSIS — Z833 Family history of diabetes mellitus: Secondary | ICD-10-CM | POA: Diagnosis not present

## 2024-04-04 DIAGNOSIS — Z882 Allergy status to sulfonamides status: Secondary | ICD-10-CM | POA: Diagnosis not present

## 2024-04-04 DIAGNOSIS — Z85828 Personal history of other malignant neoplasm of skin: Secondary | ICD-10-CM | POA: Diagnosis not present

## 2024-04-04 DIAGNOSIS — K861 Other chronic pancreatitis: Secondary | ICD-10-CM | POA: Diagnosis present

## 2024-04-04 DIAGNOSIS — Z888 Allergy status to other drugs, medicaments and biological substances status: Secondary | ICD-10-CM | POA: Diagnosis not present

## 2024-04-04 DIAGNOSIS — I509 Heart failure, unspecified: Secondary | ICD-10-CM

## 2024-04-04 LAB — BASIC METABOLIC PANEL WITH GFR
Anion gap: 5 (ref 5–15)
BUN: 5 mg/dL — ABNORMAL LOW (ref 6–20)
CO2: 29 mmol/L (ref 22–32)
Calcium: 6 mg/dL — CL (ref 8.9–10.3)
Chloride: 104 mmol/L (ref 98–111)
Creatinine, Ser: 0.48 mg/dL (ref 0.44–1.00)
GFR, Estimated: >60 mL/min (ref >60)
Glucose, Bld: 84 mg/dL (ref 70–99)
Potassium: 3.3 mmol/L — ABNORMAL LOW (ref 3.5–5.1)
Sodium: 138 mmol/L (ref 135–145)

## 2024-04-04 LAB — URINALYSIS, COMPLETE (UACMP) WITH MICROSCOPIC
Bacteria, UA: NONE SEEN
Bilirubin Urine: NEGATIVE
Glucose, UA: NEGATIVE mg/dL
Hgb urine dipstick: NEGATIVE
Ketones, ur: NEGATIVE mg/dL
Leukocytes,Ua: NEGATIVE
Nitrite: NEGATIVE
Protein, ur: NEGATIVE mg/dL
Specific Gravity, Urine: 1.003 — ABNORMAL LOW (ref 1.005–1.030)
pH: 8 (ref 5.0–8.0)

## 2024-04-04 LAB — CBC
HCT: 28.6 % — ABNORMAL LOW (ref 36.0–46.0)
Hemoglobin: 9.3 g/dL — ABNORMAL LOW (ref 12.0–15.0)
MCH: 33.9 pg (ref 26.0–34.0)
MCHC: 32.5 g/dL (ref 30.0–36.0)
MCV: 104.4 fL — ABNORMAL HIGH (ref 80.0–100.0)
Platelets: 198 K/uL (ref 150–400)
RBC: 2.74 MIL/uL — ABNORMAL LOW (ref 3.87–5.11)
RDW: 13.9 % (ref 11.5–15.5)
WBC: 5.2 K/uL (ref 4.0–10.5)
nRBC: 0 % (ref 0.0–0.2)

## 2024-04-04 LAB — COMPREHENSIVE METABOLIC PANEL WITH GFR
ALT: 12 U/L (ref 0–44)
AST: 32 U/L (ref 15–41)
Albumin: 1.6 g/dL — ABNORMAL LOW (ref 3.5–5.0)
Alkaline Phosphatase: 109 U/L (ref 38–126)
Anion gap: 5 (ref 5–15)
BUN: 6 mg/dL (ref 6–20)
CO2: 31 mmol/L (ref 22–32)
Calcium: 6.5 mg/dL — ABNORMAL LOW (ref 8.9–10.3)
Chloride: 102 mmol/L (ref 98–111)
Creatinine, Ser: 0.45 mg/dL (ref 0.44–1.00)
GFR, Estimated: >60 mL/min (ref >60)
Glucose, Bld: 78 mg/dL (ref 70–99)
Potassium: 3.6 mmol/L (ref 3.5–5.1)
Sodium: 138 mmol/L (ref 135–145)
Total Bilirubin: 0.8 mg/dL (ref 0.0–1.2)
Total Protein: 4.4 g/dL — ABNORMAL LOW (ref 6.5–8.1)

## 2024-04-04 LAB — ECHOCARDIOGRAM COMPLETE
AR max vel: 2.77 cm2
AV Area VTI: 3.67 cm2
AV Area mean vel: 2.93 cm2
AV Mean grad: 3 mmHg
AV Peak grad: 5.3 mmHg
Ao pk vel: 1.16 m/s
Area-P 1/2: 3.72 cm2
Height: 65 in
MV VTI: 2.51 cm2
S' Lateral: 2.4 cm
Weight: 2256 [oz_av]

## 2024-04-04 LAB — PROTEIN / CREATININE RATIO, URINE
Creatinine, Urine: <10 mg/dL
Total Protein, Urine: <6 mg/dL

## 2024-04-04 LAB — MAGNESIUM: Magnesium: 1.7 mg/dL (ref 1.7–2.4)

## 2024-04-04 MED ORDER — MAGNESIUM SULFATE 2 GM/50ML IV SOLN
2.0000 g | Freq: Once | INTRAVENOUS | Status: AC
  Administered 2024-04-04: 2 g via INTRAVENOUS
  Filled 2024-04-04: qty 50

## 2024-04-04 MED ORDER — SODIUM CHLORIDE 0.9 % IV BOLUS
250.0000 mL | Freq: Once | INTRAVENOUS | Status: AC
  Administered 2024-04-05: 250 mL via INTRAVENOUS

## 2024-04-04 MED ORDER — ENSURE PLUS HIGH PROTEIN PO LIQD
237.0000 mL | Freq: Three times a day (TID) | ORAL | Status: DC
  Administered 2024-04-04 – 2024-04-06 (×6): 237 mL via ORAL

## 2024-04-04 MED ORDER — MIDODRINE HCL 5 MG PO TABS
10.0000 mg | ORAL_TABLET | Freq: Once | ORAL | Status: AC
  Administered 2024-04-04: 10 mg via ORAL

## 2024-04-04 MED ORDER — CALCIUM GLUCONATE-NACL 1-0.675 GM/50ML-% IV SOLN
1.0000 g | Freq: Once | INTRAVENOUS | Status: DC
  Filled 2024-04-04: qty 50

## 2024-04-04 MED ORDER — MIDODRINE HCL 5 MG PO TABS
10.0000 mg | ORAL_TABLET | Freq: Three times a day (TID) | ORAL | Status: DC
  Administered 2024-04-04 – 2024-04-06 (×7): 10 mg via ORAL
  Filled 2024-04-04 (×8): qty 2

## 2024-04-04 MED ORDER — FLUDROCORTISONE ACETATE 0.1 MG PO TABS
0.2000 mg | ORAL_TABLET | Freq: Once | ORAL | Status: AC
  Administered 2024-04-04: 0.2 mg via ORAL
  Filled 2024-04-04: qty 2

## 2024-04-04 NOTE — Assessment & Plan Note (Signed)
 Calcium  6, albumin 1.9.  Corrected calcium  7.68 Calcium  gluconate ordered

## 2024-04-04 NOTE — Care Management Obs Status (Signed)
 MEDICARE OBSERVATION STATUS NOTIFICATION   Patient Details  Name: Abigail [name] [facility] MRN: [id number] Date of Birth: 07-16-1966   Medicare Observation Status Notification Given:  No (di not want a copy)    [name] 04/04/2024, 12:17 PM

## 2024-04-04 NOTE — ED Notes (Signed)
 Patient had large episode of vomiting after drinking ensure.  Nausea meds given.

## 2024-04-04 NOTE — ED Notes (Signed)
 Admitting MD at bedside.

## 2024-04-04 NOTE — Assessment & Plan Note (Addendum)
 Albumin 1.9 Urinalysis with spot urine to creatinine ratio to evaluate for nephrotic syndrome

## 2024-04-04 NOTE — ED Notes (Signed)
 Dr.Duncan made aware that patients bp has dropped. Order given to hold morning b/d mels

## 2024-04-04 NOTE — Progress Notes (Signed)
 [facility] Progress Note  Patient: Abigail [name] [facility]    FMW:[id number]  DOA: 04/03/2024     Date of Service: the patient was seen and examined on 04/04/2024  Chief Complaint  Patient presents with   Leg Swelling   Brief hospital course: [facility] is a 58 y.o. female with medical history significant for chronic pancreatitis/pancreatic pseudocyst, chronic back pain pain on opiates, opioid-induced constipation , seizure disorder, chronic hypoxic respiratory failure on  O2 at 2 L,,frequent hospitalizations most recently from 5/10-5/15/25 with encephalopathy of multifactorial etiology, with incidental finding of acute subsegmental PE, started on [facility], being admitted with possible new onset CHF with mainly lower extremity edema, not responding to outpatient titration of Lasix .  Symptoms started 2 weeks prior with progressively worsening lower extremity edema to where it is difficult to ambulate and the leg is now weeping.  She has no shortness of breath or chest pain.  Has no cough fever or chills.  No abdominal distention or fullness.   Denies pain in the legs.  Is compliant with anticoagulation. In the ED, BP 105/65 with otherwise normal vitals. Labs notable for BNP 911, troponin not done.  CBC unremarkable except for hemoglobin of 11.3 with MCV 101.  Chemistries notable for potassium 2.7 and low albumin of 1.9.EKG Showed sinus at 76 with nonspecific ST-T wave changes. Chest x-ray was nonacute Bilateral lower extremity venous ultrasound without evidence of DVT   Patient treated with IV Lasix  40 mg and started on IV potassium riders.   Admission requested.    Assessment and Plan:  # Bilateral lower extremity edema # New onset CHF vs nutritional/hypoalbuminemia  # History of lower extremity venous stasis Patient with progressive worsening of chronic lower extremity edema, weight gain of 3 pounds (CHF vs nutritional vs venous stasis) BNP over 900 but chest x-ray  clear Lower extremity ultrasound negative for DVT Will evaluate for CHF with echocardiogram Will check urine for protein Continue IV Lasix  Daily weights with intake and output monitoring Keep legs elevated   Hypotensive possible due to diuresis Started midodrine  10 mg p.o. 3 times daily with holding parameters Monitor BP and titrate medication accordingly   History of subsegmental pulmonary embolism 12/2023 On anticoagulation with Eliquis , compliant Bilateral lower extremity venous ultrasound negative for DVT No acute issues suspected.   Hypokalemia Replete and monitor Pharmacy consulted to assist   Hypocalcemia Calcium  6, albumin 1.9.  Corrected calcium  7.68 Calcium  gluconate ordered    Hypoalbuminemia Albumin 1.9 Urine protein negative Started Ensure protein supplement  Chronic respiratory failure with hypoxia (HCC) History of asthma Not acutely exacerbated Albuterol  as needed Continue supplemental oxygen    Epilepsy, (HCC) No concerns for seizure at this time Continue carbamazepine , gabapentin , lamotrigine  and levetiracetam       Body mass index is 23.46 kg/m.  Interventions:  Diet: Heart healthy diet DVT Prophylaxis: Eliquis   Advance goals of care discussion: Full code  Family Communication: family was present at bedside, at the time of interview.  The pt provided permission to discuss medical plan with the family. Opportunity was given to ask question and all questions were answered satisfactorily.   Disposition:  Pt is from Home, admitted with lower extremity edema,, still has edema and she is on IV Lasix , which precludes a safe discharge. Discharge to home, when stable, may need few days to improve.  Subjective: No significant events overnight, patient/significant edema in the bilateral lower extremities.  Denied any other complaints.  Physical Exam: General: NAD, lying comfortably  Appear in no distress, affect appropriate Eyes: PERRLA ENT: Oral  Mucosa Clear, moist  Neck: no JVD,  Cardiovascular: S1 and S2 Present, no Murmur,  Respiratory: good respiratory effort, Bilateral Air entry equal and Decreased, no Crackles, no wheezes Abdomen: Bowel Sound present, Soft and no tenderness,  Skin: no rashes Extremities: 4+ pedal edema, no calf tenderness Neurologic: without any new focal findings Gait not checked due to patient safety concerns  Vitals:   04/04/24 1030 04/04/24 1100 04/04/24 1255 04/04/24 1256  BP: (!) 92/57 104/65 (!) 80/51   Pulse: 72 66 64   Resp: 14 (!) 34 18   Temp:    (!) 97.4 F (36.3 C)  TempSrc:    Oral  SpO2: 98% 96% 92%   Weight:      Height:        Intake/Output Summary (Last 24 hours) at 04/04/2024 1333 Last data filed at 04/04/2024 9097 Gross per 24 hour  Intake 50 ml  Output 800 ml  Net -750 ml   Filed Weights   04/03/24 1733  Weight: 64 kg    Data Reviewed: I have personally reviewed and interpreted daily labs, tele strips, imagings as discussed above. I reviewed all nursing notes, pharmacy notes, vitals, pertinent old records I have discussed plan of care as described above with RN and patient/family.  CBC: Recent Labs  Lab 04/03/24 1805 04/04/24 0417  WBC 7.4 5.2  HGB 11.3* 9.3*  HCT 33.6* 28.6*  MCV 101.8* 104.4*  PLT 227 198   Basic Metabolic Panel: Recent Labs  Lab 04/03/24 1805 04/04/24 0123 04/04/24 0417  NA 135 138 138  K 2.7* 3.3* 3.6  CL 97* 104 102  CO2 30 29 31   GLUCOSE 92 84 78  BUN 6 5* 6  CREATININE 0.56 0.48 0.45  CALCIUM  7.2* 6.0* 6.5*  MG  --   --  1.7    Studies: US  Venous Img Lower Bilateral (DVT) Result Date: 04/03/2024 CLINICAL DATA:  Bilateral lower extremity swelling. EXAM: BILATERAL LOWER EXTREMITY VENOUS DOPPLER ULTRASOUND TECHNIQUE: Gray-scale sonography with graded compression, as well as color Doppler and duplex ultrasound were performed to evaluate the lower extremity deep venous systems from the level of the common femoral vein and  including the common femoral, femoral, profunda femoral, popliteal and calf veins including the posterior tibial, peroneal and gastrocnemius veins when visible. The superficial great saphenous vein was also interrogated. Spectral Doppler was utilized to evaluate flow at rest and with distal augmentation maneuvers in the common femoral, femoral and popliteal veins. COMPARISON:  Dec 31, 2023 FINDINGS: RIGHT LOWER EXTREMITY Common Femoral Vein: No evidence of thrombus. Normal compressibility, respiratory phasicity and response to augmentation. Saphenofemoral Junction: No evidence of thrombus. Normal compressibility and flow on color Doppler imaging. Profunda Femoral Vein: No evidence of thrombus. Normal compressibility and flow on color Doppler imaging. Femoral Vein: No evidence of thrombus. Normal compressibility, respiratory phasicity and response to augmentation. Popliteal Vein: No evidence of thrombus. Normal compressibility, respiratory phasicity and response to augmentation. Calf Veins: The RIGHT posterior tibial vein and RIGHT peroneal vein are poorly visualized. Superficial Great Saphenous Vein: No evidence of thrombus. Normal compressibility. Venous Reflux:  None. Other Findings:  None. LEFT LOWER EXTREMITY Common Femoral Vein: No evidence of thrombus. Normal compressibility, respiratory phasicity and response to augmentation. Saphenofemoral Junction: No evidence of thrombus. Normal compressibility and flow on color Doppler imaging. Profunda Femoral Vein: No evidence of thrombus. Normal compressibility and flow on color Doppler imaging. Femoral Vein: No evidence of  thrombus. Normal compressibility, respiratory phasicity and response to augmentation. Popliteal Vein: No evidence of thrombus. Normal compressibility, respiratory phasicity and response to augmentation. Calf Veins: The LEFT posterior tibial vein and LEFT peroneal vein are poorly visualized. Superficial Great Saphenous Vein: No evidence of thrombus.  Normal compressibility. Venous Reflux:  None. Other Findings:  None. IMPRESSION: Limited evaluation of the BILATERAL posterior tibial and BILATERAL peroneal veins, without evidence of deep venous thrombosis in either lower extremity. Electronically Signed   By: Suzen Dials M.D.   On: 04/03/2024 20:57   DG Chest 2 View Result Date: 04/03/2024 CLINICAL DATA:  Leg swelling EXAM: CHEST - 2 VIEW COMPARISON:  None Available. FINDINGS: Normal cardiac silhouette. Band of atelectasis in the RIGHT middle lobe. 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 No acute findings. Anterior cervical fusion IMPRESSION: 1. RIGHT middle lobe atelectasis. 2. 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 Electronically Signed   By: Jackquline Boxer M.D.   On: 04/03/2024 19:19    Scheduled Meds:  apixaban   5 mg Oral BID   carbamazepine   100 mg Oral BID   DULoxetine   60 mg Oral Daily   feeding supplement  237 mL Oral TID BM   furosemide   40 mg Intravenous Q12H   gabapentin   300 mg Oral QID   lamoTRIgine   50 mg Oral BID   levETIRAcetam   1,500 mg Oral BID   midodrine   10 mg Oral TID WC   mirtazapine   7.5 mg Oral QHS   pantoprazole   40 mg Oral Daily   Continuous Infusions: PRN Meds: acetaminophen  **OR** acetaminophen , albuterol , bisacodyl , morphine , ondansetron  **OR** ondansetron  (ZOFRAN ) IV  Time spent: 35 minutes  Author: ELVAN SOR. MD [facility] 04/04/2024 1:33 PM  To reach On-call, see care teams to locate the attending and reach out to them via [email or web address]. If 7PM-7AM, please contact night-coverage If you still have difficulty reaching the attending provider, please page the [facility] (Director on Call) for [facility] on amion for assistance.

## 2024-04-04 NOTE — Progress Notes (Signed)
 PHARMACY CONSULT NOTE - FOLLOW UP  Pharmacy Consult for Electrolyte Monitoring and Replacement   Recent Labs: Potassium (mmol/L)  Date Value  04/04/2024 3.6  05/24/2014 3.8   Magnesium  (mg/dL)  Date Value  91/83/7974 1.7   Calcium  (mg/dL)  Date Value  91/83/7974 6.5 (L)   Calcium , Total (mg/dL)  Date Value  89/94/7984 8.9   Albumin (g/dL)  Date Value  91/83/7974 1.6 (L)  05/24/2014 3.4   Phosphorus (mg/dL)  Date Value  96/82/7974 3.5   Sodium (mmol/L)  Date Value  04/04/2024 138  03/11/2024 135  05/24/2014 141     Corrected Ca = 8.4       Ca = 6.5,  Alb = 1.6        Assessment: 58 yo F admitted w/ leg swelling, new CHF? Hx: chronic pancreatitis/pancreatic pseudocyst, chronic back pain pain on opiates, opioid-induced constipation , seizure disorder, chronic hypoxic respiratory failure on O2 at 2 L,,frequent hospitalizations most recently from 5/10-5/15/25 with encephalopathy of multifactorial etiology, with incidental finding of acute subsegmental PE, started on [facility]   Pertinent medications: furosemide  40mg  IV q12h                         Goal of Therapy:  Electrolytes WNL   Plan:  Mag 1.7   Will order Magnesium  sulfate 2 gm IV x 1 Corrected Calcium = 8.4    (replacing Magnesium  should help this) - will recheck electrolytes with AM labs  Suzann Allean LABOR ,PharmD Clinical Pharmacist 04/04/2024 8:16 AM

## 2024-04-04 NOTE — Progress Notes (Signed)
*  PRELIMINARY RESULTS* Echocardiogram 2D Echocardiogram has been performed.  Abigail [name] 04/04/2024, 10:57 AM

## 2024-04-05 DIAGNOSIS — R600 Localized edema: Secondary | ICD-10-CM | POA: Diagnosis not present

## 2024-04-05 LAB — GLUCOSE, CAPILLARY: Glucose-Capillary: 84 mg/dL (ref 70–99)

## 2024-04-05 LAB — CBC
HCT: 31.2 % — ABNORMAL LOW (ref 36.0–46.0)
Hemoglobin: 9.6 g/dL — ABNORMAL LOW (ref 12.0–15.0)
MCH: 33.2 pg (ref 26.0–34.0)
MCHC: 30.8 g/dL (ref 30.0–36.0)
MCV: 108 fL — ABNORMAL HIGH (ref 80.0–100.0)
Platelets: 214 K/uL (ref 150–400)
RBC: 2.89 MIL/uL — ABNORMAL LOW (ref 3.87–5.11)
RDW: 13.9 % (ref 11.5–15.5)
WBC: 4.8 K/uL (ref 4.0–10.5)
nRBC: 0 % (ref 0.0–0.2)

## 2024-04-05 LAB — MAGNESIUM: Magnesium: 2.5 mg/dL — ABNORMAL HIGH (ref 1.7–2.4)

## 2024-04-05 LAB — BASIC METABOLIC PANEL WITH GFR
Anion gap: 7 (ref 5–15)
BUN: 7 mg/dL (ref 6–20)
CO2: 29 mmol/L (ref 22–32)
Calcium: 6.7 mg/dL — ABNORMAL LOW (ref 8.9–10.3)
Chloride: 102 mmol/L (ref 98–111)
Creatinine, Ser: 0.58 mg/dL (ref 0.44–1.00)
GFR, Estimated: >60 mL/min (ref >60)
Glucose, Bld: 73 mg/dL (ref 70–99)
Potassium: 3.8 mmol/L (ref 3.5–5.1)
Sodium: 138 mmol/L (ref 135–145)

## 2024-04-05 LAB — CORTISOL
Cortisol, Plasma: 7.1 ug/dL
Cortisol, Plasma: 10.9 ug/dL

## 2024-04-05 LAB — CORTISOL-AM, BLOOD: Cortisol - AM: 15.7 ug/dL (ref 6.7–22.6)

## 2024-04-05 LAB — PHOSPHORUS: Phosphorus: 2.1 mg/dL — ABNORMAL LOW (ref 2.5–4.6)

## 2024-04-05 MED ORDER — SODIUM CHLORIDE 0.9% FLUSH
10.0000 mL | Freq: Two times a day (BID) | INTRAVENOUS | Status: DC
  Administered 2024-04-05 – 2024-04-06 (×3): 10 mL

## 2024-04-05 MED ORDER — K PHOS MONO-SOD PHOS DI & MONO 155-852-130 MG PO TABS
500.0000 mg | ORAL_TABLET | ORAL | Status: AC
  Administered 2024-04-05 (×2): 500 mg via ORAL
  Filled 2024-04-05 (×3): qty 2

## 2024-04-05 MED ORDER — ADULT MULTIVITAMIN W/MINERALS CH
1.0000 | ORAL_TABLET | Freq: Every day | ORAL | Status: DC
  Administered 2024-04-05 – 2024-04-06 (×2): 1 via ORAL
  Filled 2024-04-05 (×2): qty 1

## 2024-04-05 MED ORDER — CALCIUM CARBONATE 1250 (500 CA) MG PO TABS
500.0000 mg | ORAL_TABLET | Freq: Two times a day (BID) | ORAL | Status: AC
  Administered 2024-04-05 (×2): 1250 mg via ORAL
  Filled 2024-04-05 (×2): qty 1

## 2024-04-05 MED ORDER — POTASSIUM CHLORIDE CRYS ER 20 MEQ PO TBCR
40.0000 meq | EXTENDED_RELEASE_TABLET | Freq: Once | ORAL | Status: AC
  Administered 2024-04-05: 40 meq via ORAL
  Filled 2024-04-05: qty 2

## 2024-04-05 MED ORDER — SODIUM CHLORIDE 0.9% FLUSH: 10.0000 mL | INTRAVENOUS | Status: DC | PRN

## 2024-04-05 MED ORDER — NICOTINE 21 MG/24HR TD PT24
21.0000 mg | MEDICATED_PATCH | Freq: Every day | TRANSDERMAL | Status: DC
  Administered 2024-04-05 – 2024-04-06 (×2): 21 mg via TRANSDERMAL
  Filled 2024-04-05 (×2): qty 1

## 2024-04-05 NOTE — Progress Notes (Signed)
 [facility] Progress Note  Patient: Abigail [name]    FMW:[id number]  DOA: 04/03/2024     Date of Service: the patient was seen and examined on 04/05/2024  Chief Complaint  Patient presents with   Leg Swelling   Brief hospital course: Abigail [name] is a 58 y.o. female with medical history significant for chronic pancreatitis/pancreatic pseudocyst, chronic back pain pain on opiates, opioid-induced constipation , seizure disorder, chronic hypoxic respiratory failure on  O2 at 2 L,,frequent hospitalizations most recently from 5/10-5/15/25 with encephalopathy of multifactorial etiology, with incidental finding of acute subsegmental PE, started on [facility], being admitted with possible new onset CHF with mainly lower extremity edema, not responding to outpatient titration of Lasix .  Symptoms started 2 weeks prior with progressively worsening lower extremity edema to where it is difficult to ambulate and the leg is now weeping.  She has no shortness of breath or chest pain.  Has no cough fever or chills.  No abdominal distention or fullness.   Denies pain in the legs.  Is compliant with anticoagulation. In the ED, BP 105/65 with otherwise normal vitals. Labs notable for BNP 911, troponin not done.  CBC unremarkable except for hemoglobin of 11.3 with MCV 101.  Chemistries notable for potassium 2.7 and low albumin of 1.9.EKG Showed sinus at 76 with nonspecific ST-T wave changes. Chest x-ray was nonacute Bilateral lower extremity venous ultrasound without evidence of DVT   Patient treated with IV Lasix  40 mg and started on IV potassium riders.   Admission requested.    Assessment and Plan:  # Bilateral lower extremity edema # HFpEF, BNP 911 elevated  # Hypoalbuminemia  # History of lower extremity venous stasis Patient with progressive worsening of chronic lower extremity edema, weight gain of 3 pounds (CHF vs nutritional vs venous stasis) BNP over 900 but chest x-ray  clear Lower extremity ultrasound negative for DVT TTE LVEF 60 to 65%, no WMA, no any other significant Continue IV Lasix  Daily weights with intake and output monitoring Keep legs elevated    Hypotensive possible due to diuresis Started midodrine  10 mg p.o. 3 times daily with holding parameters Monitor BP and titrate medication accordingly   History of subsegmental pulmonary embolism 12/2023 On anticoagulation with Eliquis , compliant Bilateral lower extremity venous ultrasound negative for DVT No acute issues suspected.   Hypokalemia Replete and monitor Pharmacy consulted to assist   Hypophosphatemia Phos repleted orally. Monitor electrolytes daily  Hypocalcemia Calcium  6, albumin 1.9.  Corrected calcium  7.68 Calcium  gluconate ordered    Hypoalbuminemia Albumin 1.9 Urine protein negative Started Ensure protein supplement  Chronic respiratory failure with hypoxia (HCC) History of asthma Not acutely exacerbated Albuterol  as needed Continue supplemental oxygen    Epilepsy, (HCC) No concerns for seizure at this time Continue carbamazepine , gabapentin , lamotrigine  and levetiracetam       Body mass index is 22.38 kg/m.  Interventions:  Diet: Heart healthy diet DVT Prophylaxis: Eliquis   Advance goals of care discussion: Full code  Family Communication: family was present at bedside, at the time of interview.  The pt provided permission to discuss medical plan with the family. Opportunity was given to ask question and all questions were answered satisfactorily.   Disposition:  Pt is from Home, admitted with lower extremity edema,, still has edema and she is on IV Lasix , which precludes a safe discharge. Discharge to home, when stable, may need few days to improve.  Subjective: No significant events overnight,  Patient feels improvement in the lower extremity  edema, denied any shortness of breath, no chest pain or palpitation, no any other complaints. Patient does  walk with a walker, we will get PT and OT eval for disposition plan in couple of days.   Physical Exam: General: NAD, lying comfortably Appear in no distress, affect appropriate Eyes: PERRLA ENT: Oral Mucosa Clear, moist  Neck: no JVD,  Cardiovascular: S1 and S2 Present, no Murmur,  Respiratory: good respiratory effort, Bilateral Air entry equal and Decreased, no Crackles, no wheezes Abdomen: Bowel Sound present, Soft and no tenderness,  Skin: no rashes Extremities: 3-4+ edema L>R, no calf tenderness Neurologic: without any new focal findings Gait not checked due to patient safety concerns  Vitals:   04/05/24 0641 04/05/24 0800 04/05/24 0830 04/05/24 1224  BP: 94/63 (!) 95/49 94/64 (!) 103/59  Pulse: 60 60 72 (!) 56  Resp: 14 13 17 16   Temp:  97.9 F (36.6 C) 98 F (36.7 C) 97.7 F (36.5 C)  TempSrc:  Oral Oral Oral  SpO2: 100% 100% 100% 99%  Weight:      Height:        Intake/Output Summary (Last 24 hours) at 04/05/2024 1526 Last data filed at 04/05/2024 1300 Gross per 24 hour  Intake 480 ml  Output 1000 ml  Net -520 ml   Filed Weights   04/03/24 1733 04/05/24 0500  Weight: 64 kg 61 kg    Data Reviewed: I have personally reviewed and interpreted daily labs, tele strips, imagings as discussed above. I reviewed all nursing notes, pharmacy notes, vitals, pertinent old records I have discussed plan of care as described above with RN and patient/family.  CBC: Recent Labs  Lab 04/03/24 1805 04/04/24 0417 04/05/24 0551  WBC 7.4 5.2 4.8  HGB 11.3* 9.3* 9.6*  HCT 33.6* 28.6* 31.2*  MCV 101.8* 104.4* 108.0*  PLT 227 198 214   Basic Metabolic Panel: Recent Labs  Lab 04/03/24 1805 04/04/24 0123 04/04/24 0417 04/05/24 0551  NA 135 138 138 138  K 2.7* 3.3* 3.6 3.8  CL 97* 104 102 102  CO2 30 29 31 29   GLUCOSE 92 84 78 73  BUN 6 5* 6 7  CREATININE 0.56 0.48 0.45 0.58  CALCIUM  7.2* 6.0* 6.5* 6.7*  MG  --   --  1.7 2.5*  PHOS  --   --   --  2.1*     Studies: No results found.   Scheduled Meds:  apixaban   5 mg Oral BID   calcium  carbonate  500 mg of elemental calcium  Oral BID WC   carbamazepine   100 mg Oral BID   DULoxetine   60 mg Oral Daily   feeding supplement  237 mL Oral TID BM   furosemide   40 mg Intravenous Q12H   gabapentin   300 mg Oral QID   lamoTRIgine   50 mg Oral BID   levETIRAcetam   1,500 mg Oral BID   midodrine   10 mg Oral TID WC   mirtazapine   7.5 mg Oral QHS   multivitamin with minerals  1 tablet Oral Daily   nicotine   21 mg Transdermal Daily   pantoprazole   40 mg Oral Daily   sodium chloride  flush  10-40 mL Intracatheter Q12H   Continuous Infusions: PRN Meds: acetaminophen  **OR** acetaminophen , albuterol , bisacodyl , morphine , ondansetron  **OR** ondansetron  (ZOFRAN ) IV, sodium chloride  flush  Time spent: 35 minutes  Author: ELVAN SOR. MD [facility] 04/05/2024 3:26 PM  To reach On-call, see care teams to locate the attending and reach out to them via  [email or web address] If 7PM-7AM, please contact night-coverage If you still have difficulty reaching the attending provider, please page the [facility] (Director on Call) for [facility] on amion for assistance.

## 2024-04-05 NOTE — Plan of Care (Signed)

## 2024-04-05 NOTE — Progress Notes (Signed)
 Initial Nutrition Assessment  DOCUMENTATION CODES:   Not applicable  INTERVENTION:  - Heart diet per MD.   - Added Heart Failure Nutrition Therapy For The Undernourished education handout to AVS. - Ensure Plus High Protein po TID, each supplement provides 350 kcal and 20 grams of protein. - Encourage intake at all meals and of supplements.  - Add Multivitamin with minerals daily - Monitor weight trends.  NUTRITION DIAGNOSIS:   Increased nutrient needs related to acute illness as evidenced by estimated needs.  GOAL:   Patient will meet greater than or equal to 90% of their needs  MONITOR:   PO intake, Supplement acceptance, Labs, Weight trends  REASON FOR ASSESSMENT:   Consult Assessment of nutrition requirement/status  ASSESSMENT:   58 y.o. female with PMH significant for chronic pancreatitis/pancreatic pseudocyst, chronic back pain pain on opiates, opioid-induced constipation, seizure disorder, chronic hypoxic respiratory failure being admitted with possible new onset CHF with mainly lower extremity edema, not responding to outpatient titration of Lasix .  Symptoms started 2 weeks prior with progressively worsening lower extremity edema to where it is difficult to ambulate and the leg is now weeping.     RD working remotely. Called patient via bedside telephone to obtain nutrition history but unable to reach patient via bedside telephone.  Per chart review, patient lost from 167# in October 2024 to 112# by the end of May. This is a 55# or 33% weight loss in 8 months, which is significant and severe for the time frame.  However, weight has trended back up since that time and patient currently weighing at 134#. Unsure if patient has gained actual weight or if this was fluid gain due to new diagnosed CHF with edema/weeping legs.  Patient currently on a Heart healthy diet. No PO intakes documented at this time. She accepted 3 Ensures yesterday but refused one this AM. She is  noted to have consumed Ensure during previous admissions, so will continue supplement for now to support oral intake.   Medications reviewed and include: Lasix , Protonix , Remeron   Labs reviewed:  Phosphorus 2.1   NUTRITION - FOCUSED PHYSICAL EXAM:  RD working remotely  Diet Order:   Diet Order             Diet Heart Room service appropriate? Yes; Fluid consistency: Thin  Diet effective now                   EDUCATION NEEDS:  Not appropriate for education at this time  Skin:  Skin Assessment: Reviewed RN Assessment  Last BM:  8/14  Height:  Ht Readings from Last 1 Encounters:  04/03/24 5' 5 (1.651 m)   Weight:  Wt Readings from Last 1 Encounters:  04/05/24 61 kg   Ideal Body Weight:  56.82 kg  BMI:  Body mass index is 22.38 kg/m.  Estimated Nutritional Needs:  Kcal:  1850-2150 kcals Protein:  90-110 grams Fluid:  >/= 1.9L    Trude Ned RD, LDN Contact via Secure Chat.

## 2024-04-05 NOTE — Discharge Instructions (Signed)
 Heart Failure Nutrition Therapy For The Undernourished  This nutrition therapy will help you eat more calories and protein in addition to helping your heart. These suggestions can help you if you can't eat enough, have lost weight, or need extra calories and protein in your diet. This plan focuses on: Eating more calories.  Eating more calories can give you more energy, help you gain weight, and promote wound healing. Eating more protein. Protein can help you heal, give you energy, and build and repair muscle. Ask your registered dietitian nutritionist (RDN) how much protein is the right amount for you. Eating a low-sodium diet while increasing your calorie and protein intake. This will help control buildup of fluids around your heart, stomach, lungs, and legs. Too much sodium may make your blood pressure too high and put stress on your heart. You can achieve these goals by: Eating more calories and protein. Eating less than 2,000 milligrams of sodium per day. Reading food labels to keep track of how much sodium, protein, and calories are in the foods you eat. Limiting fluid intake if your doctor has asked you to follow a fluid restriction. Reading the Food Label: How Much Sodium Is too Much? The nutrition plan for heart failure usually limits the sodium that you get from food and beverages to 2,000 milligrams per day. Salt is the main source of sodium. Read the nutrition label to find out how much sodium is in 1 serving. Foods with more than 300 milligrams of sodium per serving may not fit into a reduced-sodium meal plan. Check serving sizes on the label. If you eat more than 1 serving, you will get more sodium than the amount listed. You can find protein and calories on the Nutrition Facts label too. Eating More Protein and Calories Eat at least 6 small meals throughout the day. If you become full quickly after you begin eating, you may benefit from small, frequent meals instead of 3 large  meals. Keep high-calorie and high-protein snacks available in your car or bag for when you get hungry. Add extra calories and protein when cooking meals. Cook with unsalted butter, margarine, or oils instead of calorie-free cooking spray. Use reduced-fat (2%) milk instead of fat-free (skim) milk. Ask your RDN if whole milk is recommended for you. Add milk powder to protein shakes, cereal, or casseroles. Sprinkle unsalted nuts and seeds into your cereal, stir-fry, or salad. Add 1 tablespoon mayonnaise, sugar, or honey to foods (adds 50 to 100 calories). Add calories and protein to your fruits and vegetables. Fruits are low in calories. Add peanut butter, yogurt, or cottage cheese to add calories and protein. Buy fruit cups canned in syrup instead of water or juice. Vegetables are also low in calories. Add butter, sour cream, margarine, or oil for extra calories. Potatoes, corn, and peas are higher in calories than nonstarchy vegetables. Avocados are rich in calories and healthy fat. Eat them alone or use them as a topping or spread. Limit foods that have little to no nutrition. Eat fewer calorie-free and low-calorie foods such as applesauce, Jell-O, and diet soft drinks. These foods will take up space in your stomach and may leave little room for higher-calorie foods and beverages.  When shopping, avoid products that say "low calorie" or "low fat." Drink high-calorie beverages. There are several liquid nutrition supplements available that also have less than 300 milligrams of sodium per serving. Drink them between meals as snacks.  Make your own supplement at home with milk or ice  cream.  Ask your RDN if protein powder would also be a good addition. Milk and juice are high in calories. Limit plain tea, coffee, and water. These have no calories.  Foods Recommended Food Group Foods Recommended  Grains Whole wheat bread with less than 80 milligrams of sodium per slice  Many cold cereals,  especially shredded wheat and granola Oats or cream of wheat made with milk (add butter or margarine, sugar, dried fruit, and nuts for more calories and fat) Quinoa Wheat germ (sprinkle on soups, baked goods, or cereal)  Vegetables Fresh and frozen vegetables without added sauces, salt, or sodium Homemade soups (salt free or low sodium) with dry milk powder or cream Potatoes, corn, and peas  Fruits Canned fruits (canned in heavy syrup) Dried fruits, such as raisins, cranberries, and prunes Avocados  Dairy (Milk and Milk Products) Milk or milk powder Soy milk Yogurt, including Greek yogurt Small amounts of natural, blocked cheeses or reduced-sodium cheese (Swiss, ricotta, and fresh mozzarella are lower in sodium than others) Regular or soft cream cheese and low-sodium cottage cheese  Protein Foods (Meat, Poultry, Fish, and Armed forces logistics/support/administrative officer) [employer] and fish [place] bacon (check the Nutrition Facts label to make sure its not packaged in a sodium solution) Tuna canned or packed in oil Dried beans, peas, and legumes; edamame (fresh soybeans) Eggs Unsalted nuts or peanut butter  Desserts and Snacks Apple (with peanut butter); baked apple with sugar and butter             Granola bars Pudding made with reduced-fat (2%) milk or milk powder Smoothies  Custard Chocolate syrup added to milk or ice cream  Fats Tub or liquid margarine (trans fat-free) Butter (check with your doctor or RDN first) Unsaturated fat oils (canola, olive, corn, sunflower, safflower, peanut, vegetable, and soybean) Flaxseed (oil or ground)  Sodium-Free Condiments Fresh or dried herbs; pepper; vinegar; lemon juice or lime juice; salt-free seasoning mixes and marinades such as Mrs. Dash or McCormick's salt-free blend; simple salad dressings such as vinegar and oil; low-sodium ketchup. You can purchase salt-free barbecue sauce and many others on the Internet.  Ask your RDN.     Foods Not Recommended Food Group Foods Not  Recommended  Grains Breads or crackers topped with salt Cereals (hot/cold) with more than 300 milligrams sodium per serving Biscuits, cornbread, and other "quick" breads prepared with baking soda Prepackaged bread crumbs Self-rising flours  Vegetables Canned vegetables (unless they are salt free or low sodium) Frozen vegetables with high-sodium seasoning and sauces Sauerkraut and pickled vegetables Canned or dried soups (unless they are salt free or low  sodium) [place] fries and onion rings Broth-based soups  Fruits Dried fruits preserved with sodium-containing additives  Dairy (Milk and Milk Products) Buttermilk Processed cheeses such as Cheese Wiz, Velveeta, and Queso Feta cheese Shredded cheese (has more sodium than blocked cheeses) "Singles" cheese slices and string cheese  Protein Foods (Meat, Poultry, Fish, and Beans) Cured meats (bacon, ham, sausage, pepperoni, and hot dogs)  Canned meats (chili, Vienna sausage, sardines, and Spam) Smoked fish and meats Frozen meals with more than 600 milligrams of sodium Egg Beaters   Fats Salted butter or margarine  Condiments Salt, sea salt, kosher salt, onion salt, and garlic salt Seasoning mixes containing salt such as Lemon Pepper or Lawry's Bouillon cubes Catsup or ketchup Barbecue sauce and Worcestershire sauce Soy sauce Salsa, pickles, olives, relish Salad dressings: ranch, blue cheese, [place], and [place]  Alcohol Check with your doctor.   Heart Failure (  Undernourished) Sample 1-Day Menu View Nutrient Info Breakfast 1 cup oatmeal made with milk 1 tablespoon wheat germ (sprinkle on oatmeal) 1 cup (240 milliliters) reduced-fat (2%) milk 2 tablespoons chocolate syrup (add to milk) 1 banana 2 tablespoons peanut butter (for banana)  Morning Snack 1/4 cup raw almonds Cranberries (add to almonds) 1 cup (240 milliliters) oral nutritional supplement  Lunch 3 ounces grilled chicken breast 1 small potato 2 ounces cheese (to add to  potato) 2 tablespoons margarine (to add to potato) Croutons (add to salad) Walnuts (add to salad) Vinegar and oil dressing (add to salad) 1 cup (240 milliliters) juice  Afternoon Snack 10 tortilla chips Guacamole (avocado, lime juice, onion, and tomatoes, and pepper) 1 cup (240 milliliters) water or juice  Evening Meal 3 ounces herb-baked fish 1 cup homemade mashed potatoes with margarine (trans fat-free) and milk 1/2 cup creamed spinach 1 cup (240 milliliters) water or juice  Evening Snack 1 cup (240 milliliters) homemade milkshake 1 slice low-sodium mal[place] breast 1 slice whole wheat bread 1 slice cheese Mayonnaise  Daily Sum Nutrient Unit Value  Macronutrients  Energy kcal 3461  Energy kJ 14480  Protein g 151  Total lipid (fat) g 158  Carbohydrate, by difference g 381  Fiber, total dietary g 36  Sugars, total g 163  Minerals  Calcium , Ca mg 2598  Iron, Fe mg 32  Sodium, Na mg 2530  Vitamins  Vitamin C, total ascorbic acid mg 180  Vitamin A, IU IU 9009  Vitamin D  IU 575  Lipids  Fatty acids, total saturated g 43  Fatty acids, total monounsaturated g 56  Fatty acids, total polyunsaturated g 48  Cholesterol mg 252     Heart Failure (Undernourished) Vegan Sample 1-Day Menu View Nutrient Info Breakfast 1 cup oatmeal made with: 1 cup soymilk fortified with calcium , vitamin B12, and vitamin D  2 tablespoons walnuts, unsalted 1 banana 1 cup orange juice with added calcium  and vitamin D   Morning Snack  cup almond butter, unsalted 1 apple  cup granola  cup soy yogurt  Lunch 1 black bean burger, low sodium 1 hamburger bun 1 cup lettuce for salad with:  cup cucumbers, sliced  cup carrots, shredded 1 tablespoon cashews, unsalted 1 tablespoon sesame seed dressing, low sodium  cup pineapple  Afternoon Snack 1 pita, whole wheat  cup hummus  cup grapes  Evening Meal 1 cup cooked rice 1 cup red beans, cooked 1 cup corn, cooked  cup cucumber slices  cup  tomato, diced  Evening Snack Smoothie made with: 1 cup soymilk fortified with calcium , vitamin B12, and vitamin D  1 scoop soy protein powder, for smoothie 1 cup strawberries, for smoothie  Daily Sum Nutrient Unit Value  Macronutrients  Energy kcal 2831  Energy kJ 11850  Protein g 121  Total lipid (fat) g 85  Carbohydrate, by difference g 428  Fiber, total dietary g 71  Sugars, total g 154  Minerals  Calcium , Ca mg 2162  Iron, Fe mg 45  Sodium, Na mg 2054  Vitamins  Vitamin C, total ascorbic acid mg 296  Vitamin A, IU IU 17869  Vitamin D  IU 318  Lipids  Fatty acids, total saturated g 9  Fatty acids, total monounsaturated g 36  Fatty acids, total polyunsaturated g 30  Cholesterol mg 0      Copyright 2020  Academy of Nutrition and Dietetics. All rights reserved   Nutrition [facility] Stay Proper nutrition can help your body recover from illness and injury.  Foods and beverages high in protein, vitamins, and minerals help rebuild muscle loss, promote healing, & reduce fall risk.   In addition to eating healthy foods, a nutrition shake is an easy, delicious way to get the nutrition you need during and after your hospital stay  It is recommended that you continue to drink 2 bottles per day of:       Boost Breeze for at least 1 month (30 days) after your hospital stay   Tips for adding a nutrition shake into your routine: As allowed, drink one with vitamins or medications instead of water or juice Enjoy one as a tasty mid-morning or afternoon snack Drink cold or make a milkshake out of it Drink one instead of milk with cereal or snacks Use as a coffee creamer   Available at the following grocery stores and pharmacies:           * [place] [facility] * [employer] * [employer]  * [facility]          * [employer] * [employer]  * [facility]      * Target  * BJ's   * [facility]  * [employer]   * [facility] [phone]            For COUPONS visit: www.ensure.com/join or  [email or web address].br   Suggested Substitutions Ensure Plus = Boost Plus = Carnation Breakfast Essentials = Boost Compact Ensure Active Clear = Boost Breeze Glucerna Shake = Boost Glucose Control = Carnation Breakfast Essentials SUGAR FREE

## 2024-04-05 NOTE — Progress Notes (Signed)
 PHARMACY CONSULT NOTE - FOLLOW UP  Pharmacy Consult for Electrolyte Monitoring and Replacement   Recent Labs: Potassium (mmol/L)  Date Value  04/05/2024 3.8  05/24/2014 3.8   Magnesium  (mg/dL)  Date Value  91/82/7974 2.5 (H)   Calcium  (mg/dL)  Date Value  91/82/7974 6.7 (L)   Calcium , Total (mg/dL)  Date Value  89/94/7984 8.9   Albumin (g/dL)  Date Value  91/83/7974 1.6 (L)  05/24/2014 3.4   Phosphorus (mg/dL)  Date Value  91/82/7974 2.1 (L)   Sodium (mmol/L)  Date Value  04/05/2024 138  03/11/2024 135  05/24/2014 141     Corrected Ca = 8.6       Ca = 6.7,  Alb = 1.6        Assessment: 58 yo F admitted w/ leg swelling, new CHF? Hx: chronic pancreatitis/pancreatic pseudocyst, chronic back pain pain on opiates, opioid-induced constipation , seizure disorder, chronic hypoxic respiratory failure on O2 at 2 L,,frequent hospitalizations most recently from 5/10-5/15/25 with encephalopathy of multifactorial etiology, with incidental finding of acute subsegmental PE, started on [facility]   Pertinent medications: furosemide  40mg  IV q12h                         Goal of Therapy:  Electrolytes WNL   Plan:  Phos 2.1  Will order KPhos neutral 500mg  Po q4h x 2 doses K 3.8  Will order KCL 40 meq po x 1   (on lasix  IV) Corrected Ca 8.6- will order calcium  500mg  elemental PO x 2 doses  - will recheck electrolytes with AM labs  Suzann Allean LABOR ,PharmD Clinical Pharmacist 04/05/2024 8:29 AM

## 2024-04-06 ENCOUNTER — Other Ambulatory Visit: Payer: Self-pay

## 2024-04-06 DIAGNOSIS — E440 Moderate protein-calorie malnutrition: Secondary | ICD-10-CM | POA: Insufficient documentation

## 2024-04-06 DIAGNOSIS — R600 Localized edema: Secondary | ICD-10-CM | POA: Diagnosis not present

## 2024-04-06 LAB — BASIC METABOLIC PANEL WITH GFR
Anion gap: 7 (ref 5–15)
BUN: 8 mg/dL (ref 6–20)
CO2: 36 mmol/L — ABNORMAL HIGH (ref 22–32)
Calcium: 7.1 mg/dL — ABNORMAL LOW (ref 8.9–10.3)
Chloride: 99 mmol/L (ref 98–111)
Creatinine, Ser: 0.59 mg/dL (ref 0.44–1.00)
GFR, Estimated: >60 mL/min (ref >60)
Glucose, Bld: 106 mg/dL — ABNORMAL HIGH (ref 70–99)
Potassium: 3.4 mmol/L — ABNORMAL LOW (ref 3.5–5.1)
Sodium: 142 mmol/L (ref 135–145)

## 2024-04-06 LAB — CBC
HCT: 31.2 % — ABNORMAL LOW (ref 36.0–46.0)
Hemoglobin: 9.9 g/dL — ABNORMAL LOW (ref 12.0–15.0)
MCH: 33.8 pg (ref 26.0–34.0)
MCHC: 31.7 g/dL (ref 30.0–36.0)
MCV: 106.5 fL — ABNORMAL HIGH (ref 80.0–100.0)
Platelets: 243 K/uL (ref 150–400)
RBC: 2.93 MIL/uL — ABNORMAL LOW (ref 3.87–5.11)
RDW: 13.7 % (ref 11.5–15.5)
WBC: 6.3 K/uL (ref 4.0–10.5)
nRBC: 0 % (ref 0.0–0.2)

## 2024-04-06 LAB — MAGNESIUM: Magnesium: 2 mg/dL (ref 1.7–2.4)

## 2024-04-06 LAB — PHOSPHORUS: Phosphorus: 3.8 mg/dL (ref 2.5–4.6)

## 2024-04-06 MED ORDER — ACETAMINOPHEN 650 MG RE SUPP: 650.0000 mg | Freq: Four times a day (QID) | RECTAL | Status: DC | PRN

## 2024-04-06 MED ORDER — POTASSIUM CHLORIDE CRYS ER 20 MEQ PO TBCR
40.0000 meq | EXTENDED_RELEASE_TABLET | Freq: Two times a day (BID) | ORAL | Status: DC
  Administered 2024-04-06: 40 meq via ORAL
  Filled 2024-04-06: qty 2

## 2024-04-06 MED ORDER — ACETAMINOPHEN 325 MG PO TABS: 650.0000 mg | ORAL_TABLET | Freq: Four times a day (QID) | ORAL | Status: DC | PRN

## 2024-04-06 MED ORDER — ENSURE PLUS HIGH PROTEIN PO LIQD: 237.0000 mL | Freq: Three times a day (TID) | ORAL | Status: AC

## 2024-04-06 MED ORDER — MIDODRINE HCL 5 MG PO TABS
5.0000 mg | ORAL_TABLET | Freq: Three times a day (TID) | ORAL | 0 refills | Status: AC | PRN
  Filled 2024-04-06: qty 90, 30d supply, fill #0

## 2024-04-06 NOTE — Evaluation (Signed)
 Physical Therapy Evaluation Patient Details Name: Abigail [name] MRN: [id number] DOB: [birth year] Today's Date: 04/06/2024  History of Present Illness  Pt is a 58 y.o. female with medical history significant for chronic pancreatitis/pancreatic pseudocyst, chronic back pain pain on opiates, opioid-induced constipation, seizure disorder, chronic hypoxic respiratory failure on  2LO2, ,frequent hospitalizations most recently from 5/10-5/15/25 with encephalopathy of multifactorial etiology, with incidental finding of acute subsegmental PE, started on [facility], being admitted with possible new onset CHF with mainly lower extremity edema, not responding to outpatient titration of Lasix . MD assessment includes: BLE edema, CHF, hypotensive, hypokalemia, hypophosphatemia, hypocalcemia, and hypoalbuminemia.   Clinical Impression  Pt was pleasant and motivated to participate during the session and put forth good effort throughout. Pt demonstrated good eccentric and concentric control and stability during sit to/from stand transfers.  Once in standing pt able to amb 200+ feet with only mildly reduced cadence but did require cuing for general sequencing with the RW for improved safety.  Pt demonstrated no overt LOB during gait training including during start/stops and 180 deg turns.  Pt reported no adverse symptoms during the session with SpO2 and HR WNL throughout on room air.  Pt will benefit from continued PT services upon discharge to safely address deficits listed in patient problem list for decreased caregiver assistance and eventual return to PLOF.           If plan is discharge home, recommend the following: A little help with walking and/or transfers;A little help with bathing/dressing/bathroom;Assistance with cooking/housework;Assist for transportation;Help with stairs or ramp for entrance   Can travel by private vehicle        Equipment Recommendations None recommended by PT  Recommendations  for Other Services       Functional Status Assessment Patient has had a recent decline in their functional status and demonstrates the ability to make significant improvements in function in a reasonable and predictable amount of time.     Precautions / Restrictions Precautions Precautions: Fall Restrictions Weight Bearing Restrictions Per Provider Order: No      Mobility  Bed Mobility               General bed mobility comments: NT, in recliner pre-post session    Transfers Overall transfer level: Needs assistance Equipment used: Rolling walker (2 wheels) Transfers: Sit to/from Stand Sit to Stand: Supervision           General transfer comment: Good eccentric and concentric control and stability    Ambulation/Gait Ambulation/Gait assistance: Supervision Gait Distance (Feet): 200 Feet Assistive device: Rolling walker (2 wheels) Gait Pattern/deviations: Step-through pattern, Decreased step length - right, Decreased step length - left Gait velocity: decreased     General Gait Details: Mildy decreased cadence with cues given to prevent amb too close to the RW and to slow cadence and stay within the RW during sharp turns  Stairs            Wheelchair Mobility     Tilt Bed    Modified Rankin (Stroke Patients Only)       Balance Overall balance assessment: Needs assistance, History of Falls   Sitting balance-Leahy Scale: Normal     Standing balance support: Bilateral upper extremity supported, During functional activity Standing balance-Leahy Scale: Good                               Pertinent Vitals/Pain Pain Assessment Pain Assessment: 0-10 Pain  Score: 7  Pain Location: bilateral feet and back Pain Descriptors / Indicators: Sore, Aching Pain Intervention(s): Monitored during session, Patient requesting pain meds-RN notified, RN gave pain meds during session    Home Living Family/patient expects to be discharged to:: Private  residence Living Arrangements: Parent Available Help at Discharge: Family;Available 24 hours/day Type of Home: House Home Access: Stairs to enter Entrance Stairs-Rails: Right Entrance Stairs-Number of Steps: 2   Home Layout: One level Home Equipment: Agricultural consultant (2 wheels);Rollator (4 wheels) Additional Comments: lives with mother who is there 24/7, able bodied but of limited physical assistance. Mother can assist with chores around the house, meals, laundry, and minimal physial assist as needed    Prior Function Prior Level of Function : History of Falls (last six months);Independent/Modified Independent             Mobility Comments: Mod Ind amb with a RW in the home and rollator in the community, able to amb community distances, 10+ falls in the last 6 months mostly all while not using an AD ADLs Comments: Ind with sponge bathing, does not like the sensation of the shower, Ind with bathing     Extremity/Trunk Assessment   Upper Extremity Assessment Upper Extremity Assessment: Overall WFL for tasks assessed    Lower Extremity Assessment Lower Extremity Assessment: Overall WFL for tasks assessed       Communication   Communication Communication: No apparent difficulties    Cognition Arousal: Alert Behavior During Therapy: WFL for tasks assessed/performed   PT - Cognitive impairments: No apparent impairments                         Following commands: Intact       Cueing Cueing Techniques: Verbal cues     General Comments      Exercises     Assessment/Plan    PT Assessment Patient needs continued PT services  PT Problem List Decreased balance;Decreased activity tolerance       PT Treatment Interventions DME instruction;Gait training;Stair training;Functional mobility training;Therapeutic activities;Therapeutic exercise;Balance training;Patient/family education    PT Goals (Current goals can be found in the Care Plan section)  Acute Rehab  PT Goals Patient Stated Goal: Improved strength and balance PT Goal Formulation: With patient Time For Goal Achievement: 04/19/24 Potential to Achieve Goals: Good    Frequency Min 2X/week     Co-evaluation               AM-PAC PT 6 Clicks Mobility  Outcome Measure Help needed turning from your back to your side while in a flat bed without using bedrails?: None Help needed moving from lying on your back to sitting on the side of a flat bed without using bedrails?: None Help needed moving to and from a bed to a chair (including a wheelchair)?: A Little Help needed standing up from a chair using your arms (e.g., wheelchair or bedside chair)?: A Little Help needed to walk in hospital room?: A Little Help needed climbing 3-5 steps with a railing? : A Little 6 Click Score: 20    End of Session Equipment Utilized During Treatment: Gait belt Activity Tolerance: Patient tolerated treatment well Patient left: in chair;with call bell/phone within reach;with chair alarm set Nurse Communication: Mobility status PT Visit Diagnosis: History of falling (Z91.81);Difficulty in walking, not elsewhere classified (R26.2);Pain Pain - part of body: Ankle and joints of foot (bilateral)    Time: 1032-1100 PT Time Calculation (min) (ACUTE ONLY):  28 min   Charges:   PT Evaluation $PT Eval Moderate Complexity: 1 Mod PT Treatments $Gait Training: 8-22 mins PT General Charges $$ ACUTE PT VISIT: 1 Visit    D. Scott Weldon Nouri PT, DPT 04/06/24, 11:43 AM

## 2024-04-06 NOTE — TOC Transition Note (Signed)
 Transition of Care [facility]) - Discharge Note   Patient Details  Name: Abigail [name] [facility] MRN: [id number] Date of Birth: 12/11/1965  Transition of Care [facility]) CM/SW Contact:  Lauraine JAYSON Carpen, LCSW Phone Number: 04/06/2024, 2:42 PM   Clinical Narrative:  Patient has orders to discharge home today. Readmission prevention screen complete. CSW met with patient. Friend at bedside. CSW   Introduced role and explained that discharge planning would be discussed. PCP is [name], MD. Mom or friend transport her to appointments. [facility]acy [facility] [place]. No issues obtaining medications. When at home, her grandson lives with her. She has been staying at her mother's house since last admission and plans to return there at discharge: [address], [place], [place] [address]. Patient is active with [facility] for RN and PT. They can add OT as well. She has a RW, wheelchair, and an old BSC that was her grandmother's. She is agreeable to a new 3-in-1. CSW ordered through [employer] to be shipped to her mother's home per her request. No further concerns. Friend is at bedside to transport. CSW signing off.  Final next level of care: Home w Home Health Services Barriers to Discharge: No Barriers Identified   Patient Goals and CMS Choice            Discharge Placement                Patient to be transferred to facility by: Friend   Patient and family notified of of transfer: 04/06/24  Discharge Plan and Services Additional resources added to the After Visit Summary for                  DME Arranged: 3-N-1 DME Agency: [employer] Date DME Agency Contacted: 04/06/24   Representative spoke with at DME Agency: [place] HH Arranged: RN, PT, OT [facility] Agency: [facility] Date [facility] Agency Contacted: 04/06/24   Representative spoke with at [facility] Agency: [place]  Social Drivers of Health (SDOH) Interventions SDOH Screenings   Food Insecurity: No Food Insecurity (04/06/2024)  Recent Concern: Food  Insecurity - Food Insecurity Present (03/10/2024)   Received from [facility]  Housing: Low Risk  (04/06/2024)  Transportation Needs: No Transportation Needs (04/06/2024)  Utilities: Not At Risk (04/06/2024)  Depression (PHQ2-9): High Risk (02/11/2024)  Financial Resource Strain: High Risk (03/10/2024)   Received from [facility]  Physical Activity: Inactive (01/27/2024)   Received from [facility]  Social Connections: Moderately Isolated (01/27/2024)   Received from [facility]  Stress: Stress Concern Present (01/27/2024)   Received from [facility]  Tobacco Use: High Risk (04/03/2024)  Health Literacy: Adequate Health Literacy (01/27/2024)   Received from [facility]     Readmission Risk Interventions    04/06/2024    2:40 PM 12/31/2023    3:36 PM 09/26/2023    4:06 PM  Readmission Risk Prevention Plan  Transportation Screening Complete  Complete  Medication Review (RN Care Manager) Complete  Complete  PCP or Specialist appointment within 3-5 days of discharge Complete Complete   HRI or Home Care Consult Complete Complete   SW Recovery Care/Counseling Consult Complete Complete Complete  Palliative Care Screening Not Applicable Not Applicable Not Applicable  Skilled Nursing Facility Not Applicable Not Complete Not Applicable  SNF Comments  Will follow up tomorrow after PT works with her again.

## 2024-04-06 NOTE — Progress Notes (Signed)
 Nutrition Follow-up  DOCUMENTATION CODES:   Non-severe (moderate) malnutrition in context of chronic illness  INTERVENTION:   -D/c Ensure Plus High Protein po TID, each supplement provides 350 kcal and 20 grams of protein  -Boost Breeze po TID, each supplement provides 250 kcal and 9 grams of protein  -MVI with minerals daily -RD discussed low sodium diet, small/ frequent meals to help improve overall PO intake, and importance of continuing supplements at discharge; discussed where pt could obtain desired supplements; RD also provided Nutrition Post Hospital Stay handout to AVS/ discharge summary   NUTRITION DIAGNOSIS:   Moderate Malnutrition related to chronic illness (pancreatitis, CHF) as evidenced by mild fat depletion, moderate fat depletion, mild muscle depletion, moderate muscle depletion.  Ongoing  GOAL:   Patient will meet greater than or equal to 90% of their needs  Progressing   MONITOR:   PO intake, Supplement acceptance, Labs, Weight trends  REASON FOR ASSESSMENT:   Consult Assessment of nutrition requirement/status  ASSESSMENT:   58 y.o. female with PMH significant for chronic pancreatitis/pancreatic pseudocyst, chronic back pain pain on opiates, opioid-induced constipation, seizure disorder, chronic hypoxic respiratory failure being admitted with possible new onset CHF with mainly lower extremity edema, not responding to outpatient titration of Lasix .  Symptoms started 2 weeks prior with progressively worsening lower extremity edema to where it is difficult to ambulate and the leg is now weeping.  Reviewed I/O': +2.1 L x 24 hours and -1.4 L since admission  UOP: 2.1 L x 24 hours   Spoke with pt at bedside, who reports feeling fair today. Pt shares that she feels overwhelmed related to her health issues over the past 12-18 months. Per pt, she has been in the hospital multiple times secondary to weakness and low potassium. Per pt, she reports she has been  diagnosed with CHF and pancreatitis and suspects this is related to her multiple hospitalizations with potassium.   Pt shares she she has been experienced poor oral intake over the past 12-18 months secondary to feeling poorly, as well as early satiety. She consumes one meal per day and admits that often consumes take out food, such as pizza and tacos. Pt shares that she is a selective eater at baseline. Per pt, will often order a steak and salad from [employer], which will take her 2-3 days to eat; she can only eater a few bites of steak at a sitting.   Pt shares that she has been experiencing weakness and while she has not fallen, uses a walker to help with her balance.   Pt shares her UBW is around 180# and states she has lost to 110#, but has regained to 130#. Reviewed wt hx; pt has experienced a 3.7% wt loss over the past 3 months, which is not significant for time frame.   Discussed importance of good meal and supplement intake to promote healing. Noted Boost Breeze supplement at bedside; pt shares that she prefers this over the Ensure. She plans to resume drinking at home and discussed with pt how she could obtain supplements at discharge. RD also discussed how to maximize oral intake by consuming small, frequent meals and liberalizing diet to promote oral intake. Reviewed foods that were lower in sodium. Pt amenable to recommendations and eager to discharge home today.   Case discussed with OT, who planned to evaluate pt after RD was completed with assessment.   Labs reviewed: K: 3.4, CBGS: 84.    NUTRITION - FOCUSED PHYSICAL EXAM:  Flowsheet  Row Most Recent Value  Orbital Region Moderate depletion  Upper Arm Region Mild depletion  Thoracic and Lumbar Region No depletion  Buccal Region No depletion  Temple Region Moderate depletion  Clavicle Bone Region Mild depletion  Clavicle and Acromion Bone Region Mild depletion  Scapular Bone Region Mild depletion  Dorsal Hand Mild  depletion  Patellar Region Mild depletion  Anterior Thigh Region Mild depletion  Posterior Calf Region Mild depletion  Edema (RD Assessment) Mild  Hair Reviewed  Eyes Reviewed  Mouth Reviewed  Skin Reviewed  Nails Reviewed    Diet Order:   Diet Order             Diet general           Diet regular Room service appropriate? Yes; Fluid consistency: Thin; Fluid restriction: 1500 mL Fluid  Diet effective now                   EDUCATION NEEDS:   Education needs have been addressed  Skin:  Skin Assessment: Reviewed RN Assessment  Last BM:  8/14  Height:   Ht Readings from Last 1 Encounters:  04/03/24 5' 5 (1.651 m)    Weight:   Wt Readings from Last 1 Encounters:  04/06/24 57.7 kg    Ideal Body Weight:  56.82 kg  BMI:  Body mass index is 21.17 kg/m.  Estimated Nutritional Needs:   Kcal:  1750-1950  Protein:  85-100 grams  Fluid:  1.5 L    Margery ORN, RD, LDN, CDCES Registered Dietitian III Certified Diabetes Care and Education Specialist If unable to reach this RD, please use RD Inpatient group chat on secure chat between hours of 8am-4 pm daily

## 2024-04-06 NOTE — Plan of Care (Signed)

## 2024-04-06 NOTE — Progress Notes (Signed)
 PHARMACY CONSULT NOTE - FOLLOW UP  Pharmacy Consult for Electrolyte Monitoring and Replacement   Recent Labs: Potassium (mmol/L)  Date Value  04/06/2024 3.4 (L)  05/24/2014 3.8   Magnesium  (mg/dL)  Date Value  91/81/7974 2.0   Calcium  (mg/dL)  Date Value  91/81/7974 7.1 (L)   Calcium , Total (mg/dL)  Date Value  89/94/7984 8.9   Albumin (g/dL)  Date Value  91/83/7974 1.6 (L)  05/24/2014 3.4   Phosphorus (mg/dL)  Date Value  91/81/7974 3.8   Sodium (mmol/L)  Date Value  04/06/2024 142  03/11/2024 135  05/24/2014 141    Assessment: 58 yo F admitted w/ leg swelling, new CHF? Hx: chronic pancreatitis/pancreatic pseudocyst, chronic back pain pain on opiates, opioid-induced constipation , seizure disorder, chronic hypoxic respiratory failure on O2 at 2 L,,frequent hospitalizations most recently from 5/10-5/15/25 with encephalopathy of multifactorial etiology, with incidental finding of acute subsegmental PE, started on [facility]   Pertinent medications: furosemide  40mg  IV q12h                         Goal of Therapy:  Electrolytes WNL   Plan:  KCL 40 mEq x 2  F/u with AM labs.    Cathaleen GORMAN Blanch ,PharmD Clinical Pharmacist 04/06/2024 8:01 AM

## 2024-04-06 NOTE — TOC CM/SW Note (Signed)
 Patient is not able to walk the distance required to go the bathroom, or he/she is unable to safely negotiate stairs required to access the bathroom.  A 3in1 BSC will alleviate this problem

## 2024-04-06 NOTE — Care Management Important Message (Signed)
 Important Message  Patient Details  Name: Abigail [name] [facility] MRN: [id number] Date of Birth: Jun 24, 1966   Important Message Given:  Yes - Medicare IM     Abigail [name] 04/06/2024, 2:11 PM

## 2024-04-06 NOTE — Evaluation (Signed)
 Occupational Therapy Evaluation Patient Details Name: Abigail [name] [facility] MRN: [id number] DOB: Jan 25, 1966 Today's Date: 04/06/2024   History of Present Illness   Pt is a 58 y.o. female with medical history significant for chronic pancreatitis/pancreatic pseudocyst, chronic back pain pain on opiates, opioid-induced constipation, seizure disorder, chronic hypoxic respiratory failure on  2LO2, ,frequent hospitalizations most recently from 5/10-5/15/25 with encephalopathy of multifactorial etiology, with incidental finding of acute subsegmental PE, started on [facility], being admitted with possible new onset CHF with mainly lower extremity edema, not responding to outpatient titration of Lasix . MD assessment includes: BLE edema, CHF, hypotensive, hypokalemia, hypophosphatemia, hypocalcemia, and hypoalbuminemia.     Clinical Impressions Pt was seen for OT evaluation this date. Prior to hospital admission, pt was Mod I with ADLs and ADL mobility utilizing DME (has RW and Rollator available, however typically uses RW). Pt lives with mother in a 1 level house with 2 STE via garage with grab bar available, bathroom is not RW accessible and must leave RW outside of bathroom then furniture walk within bathroom.  Reports having an old toilet riser on commode however she reports is belonged to her grandmother and no long secures to commode. Pt presents with deficits in standing balance and occasional safety awareness, affecting safe and optimal ADL completion. Pt currently requires CGA for standing components of ADLs and SBA to Supervision for seated components and depending on transfer surfaces can required between Supervision and CGA for safe transition.  Pt would benefit from skilled OT services to address noted impairments and functional limitations (see below for any additional details) in order to maximize safety and independence while minimizing future risk of falls, injury, and readmission. Anticipate the need  for follow up OT services upon acute hospital DC.      If plan is discharge home, recommend the following:   A little help with walking and/or transfers;A little help with bathing/dressing/bathroom;Help with stairs or ramp for entrance;Assistance with cooking/housework;Direct supervision/assist for medications management     Functional Status Assessment   Patient has had a recent decline in their functional status and demonstrates the ability to make significant improvements in function in a reasonable and predictable amount of time.     Equipment Recommendations   BSC/3in1     Recommendations for Other Services         Precautions/Restrictions   Precautions Precautions: Fall Recall of Precautions/Restrictions: Impaired Precaution/Restrictions Comments: fall risk Restrictions Weight Bearing Restrictions Per Provider Order: No     Mobility Bed Mobility Overal bed mobility: Modified Independent             General bed mobility comments: Mod I with HOB slightly elevated and with use of bed rails    Transfers Overall transfer level: Needs assistance   Transfers: Sit to/from Stand Sit to Stand: Contact guard assist           General transfer comment: CGA for sit to stand from EOB x 2 attempts      Balance Overall balance assessment: Needs assistance, History of Falls   Sitting balance-Leahy Scale: Normal     Standing balance support: Bilateral upper extremity supported, During functional activity, Single extremity supported, No upper extremity supported Standing balance-Leahy Scale: Good Standing balance comment: G-/F+ standing balance, pt LOB x1 (posteriorly) when completing sit to stand at EOB to complete LB clothing management.  ADL either performed or assessed with clinical judgement   ADL Overall ADL's : Needs assistance/impaired                     Lower Body Dressing: Contact guard assist;Sit  to/from stand;Bed level Lower Body Dressing Details (indicate cue type and reason): donned undergarment while supine in bed, stood to pull over hips, sock donned at [employer] Transfer: Rolling walker (2 wheels);Contact guard assist Toilet Transfer Details (indicate cue type and reason): simulated Toileting- Clothing Manipulation and Hygiene: Contact guard assist;Sit to/from stand       Functional mobility during ADLs: Contact guard assist;Rolling walker (2 wheels) General ADL Comments: CGA for standing components of ADLs, completed parts of tasks while supine with modified technique.     Vision Patient Visual Report: No change from baseline       Perception         Praxis         Pertinent Vitals/Pain Pain Assessment Pain Assessment: No/denies pain Pain Score: 0-No pain Pain Location: no pain was reported during OT evaluation     Extremity/Trunk Assessment Upper Extremity Assessment Upper Extremity Assessment: Overall WFL for tasks assessed   Lower Extremity Assessment Lower Extremity Assessment: Defer to PT evaluation       Communication Communication Communication: No apparent difficulties   Cognition Arousal: Alert Behavior During Therapy: WFL for tasks assessed/performed Cognition: No apparent impairments                               Following commands: Intact       Cueing  General Comments   Cueing Techniques: Verbal cues      Exercises Other Exercises Other Exercises: Ed on role of OT, ed on hand placement during ADL transfers, ed on safety awareness during ADLs, ed on establishing good BOS prior to attempting to use BUEs in standing at RW level to reduce risk of LOB.   Shoulder Instructions      Home Living Family/patient expects to be discharged to:: Private residence Living Arrangements: Parent Available Help at Discharge: Family;Available 24 hours/day (Supervision level support, no physical assistance able to be  provided) Type of Home: House Home Access: Stairs to enter [employer] of Steps: 2 Entrance Stairs-Rails: Right Home Layout: One level     Bathroom Shower/Tub: Chief Strategy Officer: Standard     Home Equipment: Agricultural consultant (2 wheels);Rollator (4 wheels)   Additional Comments: lives with mother who is there 24/7, able bodied but of limited physical assistance. Mother can assist with chores around the house, meals, laundry, and minimal physial assist as needed      Prior Functioning/Environment Prior Level of Function : History of Falls (last six months);Independent/Modified Independent             Mobility Comments: Mod Ind amb with a RW in the home and rollator in the community, able to amb community distances, 10+ falls in the last 6 months mostly all while not using an AD ADLs Comments: Ind with dressing and sponge bathing, does not like the sensation of the shower, Ind with bathing    OT Problem List: Decreased safety awareness;Impaired balance (sitting and/or standing)   OT Treatment/Interventions: Self-care/ADL training;Therapeutic activities;Patient/family education;Balance training      OT Goals(Current goals can be found in the care plan section)   Acute Rehab OT Goals Patient Stated Goal: to go home today OT  Goal Formulation: With patient Time For Goal Achievement: 04/20/24 Potential to Achieve Goals: Good ADL Goals Pt Will Perform Grooming: with modified independence;standing Pt Will Perform Upper Body Dressing: with modified independence;sitting Pt Will Perform Lower Body Dressing: with modified independence;sit to/from stand Pt Will Transfer to Toilet: with modified independence;bedside commode;regular height toilet;grab bars Pt Will Perform Toileting - Clothing Manipulation and hygiene: with modified independence;sit to/from stand   OT Frequency:  Min 2X/week    Co-evaluation              AM-PAC OT 6 Clicks Daily  Activity     Outcome Measure Help from another person eating meals?: None Help from another person taking care of personal grooming?: None Help from another person toileting, which includes using toliet, bedpan, or urinal?: A Little Help from another person bathing (including washing, rinsing, drying)?: A Little Help from another person to put on and taking off regular upper body clothing?: None Help from another person to put on and taking off regular lower body clothing?: A Little 6 Click Score: 21   End of Session Equipment Utilized During Treatment: Rolling walker (2 wheels);Gait belt Nurse Communication: Mobility status  Activity Tolerance: Patient tolerated treatment well Patient left: in chair;with call bell/phone within reach;with chair alarm set;Other (comment) (PT hand-off)  OT Visit Diagnosis: Unsteadiness on feet (R26.81)                Time: 1002-1030 OT Time Calculation (min): 28 min Charges:  OT General Charges $OT Visit: 1 Visit OT Evaluation $OT Eval Low Complexity: 1 Low OT Treatments $Self Care/Home Management : 23-37 mins  Harlene Sharps OTR/L   Harlene LITTIE Sharps 04/06/2024, 1:16 PM

## 2024-04-06 NOTE — Discharge Summary (Signed)
 [facility] Discharge Summary   Patient: Abigail [name] [facility] FMW:[id number]  PCP: [name], Susan K, MD  Date of admission: 04/03/2024   Date of discharge:  04/06/2024     Discharge Diagnoses:  Principal Problem:   Bilateral lower extremity edema Active Problems:   Hypokalemia   History of subsegmental pulmonary embolism 12/2023   Hypoalbuminemia   Hypocalcemia   Long term current use of immunosuppressive drug   Epilepsy, (HCC)   Chronic respiratory failure with hypoxia (HCC)   CHF (congestive heart failure) (HCC)   Admitted From: Home Disposition:  Home with [facility] services   Recommendations for Outpatient Follow-up:  Follow-up with PCP in 1 week, continue monitor BP at home and follow with PCP to titrate medication accordingly, BP remained low so started on midodrine  during hospital stay, discharged on midodrine  as needed if systolic BP less than 100 mmHg. Keep legs elevated, and may use TED hose stockings or has wraps at home Continue protein supplement Follow up LABS/TEST:  Repeat CBC and BMP after 1 week   Follow-up Information     [name], Susan K, MD Follow up in 1 week(s).   Specialty: Family Medicine Contact information: [address] [place] [address] [phone]                Diet recommendation: Cardiac diet  Activity: The patient is advised to gradually reintroduce usual activities, as tolerated  Discharge Condition: stable  Code Status: Full code   History of present illness: As per the H and P dictated on admission. Hospital Course:  [name] is a 58 y.o. female with medical history significant for chronic pancreatitis/pancreatic pseudocyst, chronic back pain pain on opiates, opioid-induced constipation , seizure disorder, chronic hypoxic respiratory failure on  O2 at 2 L,,frequent hospitalizations most recently from 5/10-5/15/25 with encephalopathy of multifactorial etiology, with incidental finding of acute  subsegmental PE, started on [facility], being admitted with possible new onset CHF with mainly lower extremity edema, not responding to outpatient titration of Lasix .  Symptoms started 2 weeks prior with progressively worsening lower extremity edema to where it is difficult to ambulate and the leg is now weeping.  She has no shortness of breath or chest pain.  Has no cough fever or chills.  No abdominal distention or fullness.   Denies pain in the legs.  Is compliant with anticoagulation. In the ED, BP 105/65 with otherwise normal vitals. Labs notable for BNP 911, troponin not done.  CBC unremarkable except for hemoglobin of 11.3 with MCV 101.  Chemistries notable for potassium 2.7 and low albumin of 1.9.EKG Showed sinus at 76 with nonspecific ST-T wave changes. Chest x-ray was nonacute Bilateral lower extremity venous ultrasound without evidence of DVT   Patient treated with IV Lasix  40 mg and started on IV potassium riders.   Admission requested.      Assessment and Plan:   # Bilateral lower extremity edema # HFpEF, BNP 911 elevated  # Hypoalbuminemia  # History of lower extremity venous stasis Patient with progressive worsening of chronic lower extremity edema, weight gain of 3 pounds (CHF vs nutritional vs venous stasis) BNP over 900 but chest x-ray clear Lower extremity ultrasound negative for DVT TTE LVEF 60 to 65%, no WMA, no any other significant  S/p IV Lasix  given during hospital stay.  Edema improved, almost resolved.  Patient was advised to use stockings and keep legs elevated when laying in the bed or sitting in the recliner.  Continued Lasix  40  mg p.o. daily and potassium chloride  20 mEq p.o. daily.   Hypotensive possible due to diuresis S/p Midodrine  10 mg p.o. 3 TID.  BP improved on discharge.  Patient was prescribed midodrine  5 mg p.o. 3 times daily as needed if systolic BP less than 100 mmHg. Patient was advised to monitor BP and follow with PCP to titrate medications  accordingly  History of subsegmental pulmonary embolism 12/2023 On anticoagulation with Eliquis , compliant Bilateral lower extremity venous ultrasound negative for DVT No acute issues suspected.   Hypokalemia: Potassium repleted and resolved. Hypophosphatemia: Phos repleted orally.  Resolved Hypocalcemia: Calcium  6, albumin 1.9.  Corrected calcium  7.68 S/p Calcium  gluconate x 1 dose. Ca 7.1 improved, started protein supplement.  Repeat BMP in 1 week and follow with PCP. # Hypoalbuminemia: Albumin 1.9 due to nutritional deficiency. Urine protein negative. Started Ensure protein supplement   Chronic respiratory failure with hypoxia (HCC) History of asthma: Not acutely exacerbated. Albuterol  as needed Continue supplemental oxygen    Epilepsy, (HCC): No concerns for seizure at this time Continue carbamazepine , gabapentin , lamotrigine  and levetiracetam    Body mass index is 21.17 kg/m.  Nutrition Problem: Increased nutrient needs Etiology: acute illness Nutrition Interventions: Interventions: Refer to RD note for recommendations, Ensure Enlive (each supplement provides 350kcal and 20 grams of protein), MVI   - Patient was instructed, not to drive, operate heavy machinery, perform activities at heights, swimming or participation in water activities or provide baby sitting services while on Pain, Sleep and Anxiety Medications; until her outpatient Physician has advised to do so again.  - Also recommended to not to take more than prescribed Pain, Sleep and Anxiety Medications.  Patient was seen by physical therapy, who recommended Home health, which was arranged. On the day of the discharge the patient's vitals were stable, and no other acute medical condition were reported by patient. the patient was felt safe to be discharge at Home with Home health.  Consultants: None Procedures: None  Discharge Exam: General: Appear in no distress, no Rash; Oral Mucosa Clear, moist. Cardiovascular:  S1 and S2 Present, no Murmur, Respiratory: normal respiratory effort, Bilateral Air entry present and no Crackles, no wheezes Abdomen: Bowel Sound present, Soft and no tenderness.  Extremities: mild Pedal edema, no calf tenderness Neurology: alert and oriented to time, place, and person affect appropriate.  Filed Weights   04/03/24 1733 04/05/24 0500 04/06/24 0500  Weight: 64 kg 61 kg 57.7 kg   Vitals:   04/06/24 1259 04/06/24 1304  BP: 119/79 122/84  Pulse: 78 78  Resp: 16 16  Temp: 97.8 F (36.6 C)   SpO2: 94% 94%    DISCHARGE MEDICATION: Allergies as of 04/06/2024       Reactions   Nsaids Hives   Tapentadol Swelling, Rash, Other (See Comments)   Nucynta- Made her deathly sick   Cephalexin  Other (See Comments)   Reaction not cited   Codeine Other (See Comments)   Reaction not cited   Darvocet [propoxyphene N-acetaminophen ] Other (See Comments)   Reaction not cited   Latex Other (See Comments)   Reaction not cited   Silicone Other (See Comments)   Reaction not cited   Sulfa Antibiotics Hives   Tape Other (See Comments)   Reaction not cited   Meloxicam Rash        Medication List     STOP taking these medications    doxycycline  100 MG capsule Commonly known as: VIBRAMYCIN    megestrol  400 MG/10ML suspension Commonly known as: MEGACE   TAKE these medications    acetaminophen  500 MG tablet Commonly known as: TYLENOL  Take 1,000 mg by mouth every 6 (six) hours as needed for moderate pain (pain score 4-6).   albuterol  108 (90 Base) MCG/ACT inhaler Commonly known as: VENTOLIN  HFA Inhale 2 puffs into the lungs. Take two (2) puffs by mouth every 4 to 6 hours as needed for wheezing or shortness of breath   AMBULATORY NON FORMULARY MEDICATION Knee-high, medium compression, graduated compression stockings. Apply to lower extremities. [email or web address], Zippered Compression Stockings, medium circ, long length   apixaban  5 MG Tabs tablet Commonly  known as: Eliquis  Take 1 tablet (5 mg total) by mouth 2 (two) times daily.   bisacodyl  5 MG EC tablet Generic drug: bisacodyl  Take 1 tablet (5 mg total) by mouth daily as needed for moderate constipation.   carbamazepine  100 MG 12 hr tablet Commonly known as: TEGRETOL  XR Take 1 tablet (100 mg total) by mouth 2 (two) times daily.   DULoxetine  60 MG capsule Commonly known as: Cymbalta  Take 1 capsule (60 mg total) by mouth daily.   feeding supplement Liqd Take 237 mLs by mouth 3 (three) times daily between meals.   furosemide  40 MG tablet Commonly known as: LASIX  Take 1 tablet (40 mg total) by mouth daily.   gabapentin  300 MG capsule Commonly known as: NEURONTIN  Take 1 capsule (300 mg total) by mouth 4 (four) times daily.   lamoTRIgine  25 MG tablet Commonly known as: LAMICTAL  Take 2 tablets (50 mg total) by mouth 2 (two) times daily.   levETIRAcetam  750 MG tablet Commonly known as: KEPPRA  Take 2 tablets (1,500 mg total) by mouth 2 (two) times daily.   Melatonin 10 MG Tabs Take 10 mg by mouth at bedtime.   midodrine  5 MG tablet Commonly known as: PROAMATINE  Take 1 tablet (5 mg total) by mouth 3 (three) times daily as needed (Systolic BP <100 mmHg).   mirtazapine  7.5 MG tablet Commonly known as: REMERON  Take 1 tablet (7.5 mg total) by mouth at bedtime.   morphine  15 MG tablet Commonly known as: MSIR Take 1 tablet (15 mg total) by mouth every 8 (eight) hours as needed for severe pain (pain score 7-10).   NON FORMULARY Apply 1 Application topically 3 (three) times daily as needed. Rock Salt Cream (Similar to [employer])   ondansetron  4 MG tablet Commonly known as: ZOFRAN  Take 1 tablet (4 mg total) by mouth every 8 (eight) hours as needed for nausea or vomiting.   pantoprazole  40 MG tablet Commonly known as: PROTONIX  Take 1 tablet (40 mg total) by mouth daily.   potassium chloride  SA 20 MEQ tablet Commonly known as: KLOR-CON  M Take 1 tablet (20 mEq total) by mouth  daily.   Santyl  250 UNIT/GM ointment Generic drug: collagenase  Apply 1 Application topically daily.   VITAMIN D -3 PO Take 1 capsule by mouth every morning.               Discharge Care Instructions  (From admission, onward)           Start     Ordered   04/06/24 0000  Discharge wound care:       Comments: As per RN   04/06/24 1311           Allergies  Allergen Reactions   Nsaids Hives   Tapentadol Swelling, Rash and Other (See Comments)    Nucynta- Made her deathly sick   Cephalexin  Other (See Comments)    Reaction not cited  Codeine Other (See Comments)    Reaction not cited   Darvocet [Propoxyphene N-Acetaminophen ] Other (See Comments)    Reaction not cited   Latex Other (See Comments)    Reaction not cited   Silicone Other (See Comments)    Reaction not cited   Sulfa Antibiotics Hives   Tape Other (See Comments)    Reaction not cited   Meloxicam Rash   Discharge Instructions     Call MD for:   Complete by: As directed    Recurrent lower extremity edema   Call MD for:  difficulty breathing, headache or visual disturbances   Complete by: As directed    Call MD for:  extreme fatigue   Complete by: As directed    Call MD for:  persistant dizziness or light-headedness   Complete by: As directed    Call MD for:  persistant nausea and vomiting   Complete by: As directed    Call MD for:  redness, tenderness, or signs of infection (pain, swelling, redness, odor or green/yellow discharge around incision site)   Complete by: As directed    Call MD for:  severe uncontrolled pain   Complete by: As directed    Call MD for:  temperature >100.4   Complete by: As directed    Diet general   Complete by: As directed    Discharge instructions   Complete by: As directed    Follow-up with PCP in 1 week, continue monitor BP at home and follow with PCP to titrate medication accordingly, BP remained low so started on midodrine  during hospital stay, discharged  on midodrine  as needed if systolic BP less than 100 mmHg. Repeat CBC and BMP after 1 week Keep legs elevated, and may use TED hose stockings or has wraps at home Continue protein supplement.   Discharge wound care:   Complete by: As directed    As per RN   Increase activity slowly   Complete by: As directed        The results of significant diagnostics from this hospitalization (including imaging, microbiology, ancillary and laboratory) are listed below for reference.    Significant Diagnostic Studies: ECHOCARDIOGRAM COMPLETE Result Date: 04/04/2024    ECHOCARDIOGRAM REPORT   Patient Name:   [facility] Date of Exam: 04/04/2024 Medical Rec #:  [id number]                 Height:       65.0 in Accession #:    [id number]                Weight:       141.0 lb Date of Birth:  08/09/1966                 BSA:          1.705 m Patient Age:    58 years                  BP:           92/57 mmHg Patient Gender: F                         HR:           72 bpm. Exam Location:  [facility] Procedure: 2D Echo, Color Doppler and Cardiac Doppler (Both Spectral and Color            Flow Doppler were utilized during procedure). Indications:  CHF-acute diastolic I50.31  History:         Patient has no prior history of Echocardiogram examinations.                  Migraines.  Sonographer:     Christopher Furnace Referring Phys:  [id number] DELAYNE LULLA SOLIAN Diagnosing Phys: Redell Cave MD  Sonographer Comments: Technically challenging study due to limited acoustic windows and no parasternal window. IMPRESSIONS  1. Left ventricular ejection fraction, by estimation, is 60 to 65%. The left ventricle has normal function. The left ventricle has no regional wall motion abnormalities. Left ventricular diastolic parameters were normal.  2. Right ventricular systolic function is normal. The right ventricular size is normal.  3. The mitral valve is normal in structure. No evidence of mitral valve regurgitation.  4. The aortic valve  was not well visualized. Aortic valve regurgitation is not visualized. FINDINGS  Left Ventricle: Left ventricular ejection fraction, by estimation, is 60 to 65%. The left ventricle has normal function. The left ventricle has no regional wall motion abnormalities. The left ventricular internal cavity size was normal in size. There is  no left ventricular hypertrophy. Left ventricular diastolic parameters were normal. Right Ventricle: The right ventricular size is normal. No increase in right ventricular wall thickness. Right ventricular systolic function is normal. Left Atrium: Left atrial size was normal in size. Right Atrium: Right atrial size was normal in size. Pericardium: There is no evidence of pericardial effusion. Mitral Valve: The mitral valve is normal in structure. No evidence of mitral valve regurgitation. MV peak gradient, 5.2 mmHg. The mean mitral valve gradient is 2.0 mmHg. Tricuspid Valve: The tricuspid valve is not well visualized. Tricuspid valve regurgitation is not demonstrated. Aortic Valve: The aortic valve was not well visualized. Aortic valve regurgitation is not visualized. Aortic valve mean gradient measures 3.0 mmHg. Aortic valve peak gradient measures 5.3 mmHg. Aortic valve area, by VTI measures 3.67 cm. Pulmonic Valve: The pulmonic valve was not well visualized. Pulmonic valve regurgitation is not visualized. Aorta: The aortic root is normal in size and structure. IAS/Shunts: No atrial level shunt detected by color flow Doppler.  LEFT VENTRICLE PLAX 2D LVIDd:         3.70 cm   Diastology LVIDs:         2.40 cm   LV e' medial:    10.10 cm/s LV PW:         1.00 cm   LV E/e' medial:  9.4 LV IVS:        1.10 cm   LV e' lateral:   12.60 cm/s LVOT diam:     2.00 cm   LV E/e' lateral: 7.6 LV SV:         81 LV SV Index:   48 LVOT Area:     3.14 cm  RIGHT VENTRICLE RV Basal diam:  2.70 cm RV Mid diam:    2.90 cm RV S prime:     14.40 cm/s TAPSE (M-mode): 2.3 cm LEFT ATRIUM             Index         RIGHT ATRIUM          Index LA diam:        3.10 cm 1.82 cm/m   RA Area:     8.45 cm LA Vol (A2C):   19.1 ml 11.20 ml/m  RA Volume:   12.20 ml 7.16 ml/m LA Vol (A4C):   37.0 ml 21.70 ml/m  LA Biplane Vol: 27.1 ml 15.89 ml/m  AORTIC VALVE AV Area (Vmax):    2.77 cm AV Area (Vmean):   2.93 cm AV Area (VTI):     3.67 cm AV Vmax:           115.50 cm/s AV Vmean:          77.300 cm/s AV VTI:            0.222 m AV Peak Grad:      5.3 mmHg AV Mean Grad:      3.0 mmHg LVOT Vmax:         102.00 cm/s LVOT Vmean:        72.200 cm/s LVOT VTI:          0.259 m LVOT/AV VTI ratio: 1.17  AORTA Ao Root diam: 3.40 cm MITRAL VALVE               TRICUSPID VALVE MV Area (PHT): 3.72 cm    TR Peak grad:   23.4 mmHg MV Area VTI:   2.51 cm    TR Vmax:        242.00 cm/s MV Peak grad:  5.2 mmHg MV Mean grad:  2.0 mmHg    SHUNTS MV Vmax:       1.14 m/s    Systemic VTI:  0.26 m MV Vmean:      65.8 cm/s   Systemic Diam: 2.00 cm MV Decel Time: 204 msec MV E velocity: 95.40 cm/s MV A velocity: 78.10 cm/s MV E/A ratio:  1.22 Redell Cave MD Electronically signed by Redell Cave MD Signature Date/Time: 04/04/2024/3:38:55 PM    Final    US  Venous Img Lower Bilateral (DVT) Result Date: 04/03/2024 CLINICAL DATA:  Bilateral lower extremity swelling. EXAM: BILATERAL LOWER EXTREMITY VENOUS DOPPLER ULTRASOUND TECHNIQUE: Gray-scale sonography with graded compression, as well as color Doppler and duplex ultrasound were performed to evaluate the lower extremity deep venous systems from the level of the common femoral vein and including the common femoral, femoral, profunda femoral, popliteal and calf veins including the posterior tibial, peroneal and gastrocnemius veins when visible. The superficial great saphenous vein was also interrogated. Spectral Doppler was utilized to evaluate flow at rest and with distal augmentation maneuvers in the common femoral, femoral and popliteal veins. COMPARISON:  Dec 31, 2023 FINDINGS: RIGHT LOWER  EXTREMITY Common Femoral Vein: No evidence of thrombus. Normal compressibility, respiratory phasicity and response to augmentation. Saphenofemoral Junction: No evidence of thrombus. Normal compressibility and flow on color Doppler imaging. Profunda Femoral Vein: No evidence of thrombus. Normal compressibility and flow on color Doppler imaging. Femoral Vein: No evidence of thrombus. Normal compressibility, respiratory phasicity and response to augmentation. Popliteal Vein: No evidence of thrombus. Normal compressibility, respiratory phasicity and response to augmentation. Calf Veins: The RIGHT posterior tibial vein and RIGHT peroneal vein are poorly visualized. Superficial Great Saphenous Vein: No evidence of thrombus. Normal compressibility. Venous Reflux:  None. Other Findings:  None. LEFT LOWER EXTREMITY Common Femoral Vein: No evidence of thrombus. Normal compressibility, respiratory phasicity and response to augmentation. Saphenofemoral Junction: No evidence of thrombus. Normal compressibility and flow on color Doppler imaging. Profunda Femoral Vein: No evidence of thrombus. Normal compressibility and flow on color Doppler imaging. Femoral Vein: No evidence of thrombus. Normal compressibility, respiratory phasicity and response to augmentation. Popliteal Vein: No evidence of thrombus. Normal compressibility, respiratory phasicity and response to augmentation. Calf Veins: The LEFT posterior tibial vein and LEFT peroneal vein are poorly visualized. Superficial Great Saphenous Vein:  No evidence of thrombus. Normal compressibility. Venous Reflux:  None. Other Findings:  None. IMPRESSION: Limited evaluation of the BILATERAL posterior tibial and BILATERAL peroneal veins, without evidence of deep venous thrombosis in either lower extremity. Electronically Signed   By: Suzen Dials M.D.   On: 04/03/2024 20:57   DG Chest 2 View Result Date: 04/03/2024 CLINICAL DATA:  Leg swelling EXAM: CHEST - 2 VIEW COMPARISON:   None Available. FINDINGS: Normal cardiac silhouette. Band of atelectasis in the RIGHT middle lobe. 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 No acute findings. Anterior cervical fusion IMPRESSION: 1. RIGHT middle lobe atelectasis. 2. 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 Electronically Signed   By: Jackquline Boxer M.D.   On: 04/03/2024 19:19    Microbiology: No results found for this or any previous visit (from the past 240 hours).   Labs: CBC: Recent Labs  Lab 04/03/24 1805 04/04/24 0417 04/05/24 0551 04/06/24 0530  WBC 7.4 5.2 4.8 6.3  HGB 11.3* 9.3* 9.6* 9.9*  HCT 33.6* 28.6* 31.2* 31.2*  MCV 101.8* 104.4* 108.0* 106.5*  PLT 227 198 214 243   Basic Metabolic Panel: Recent Labs  Lab 04/03/24 1805 04/04/24 0123 04/04/24 0417 04/05/24 0551 04/06/24 0530  NA 135 138 138 138 142  K 2.7* 3.3* 3.6 3.8 3.4*  CL 97* 104 102 102 99  CO2 30 29 31 29  36*  GLUCOSE 92 84 78 73 106*  BUN 6 5* 6 7 8   CREATININE 0.56 0.48 0.45 0.58 0.59  CALCIUM  7.2* 6.0* 6.5* 6.7* 7.1*  MG  --   --  1.7 2.5* 2.0  PHOS  --   --   --  2.1* 3.8   Liver Function Tests: Recent Labs  Lab 04/03/24 1805 04/04/24 0417  AST 32 32  ALT 15 12  ALKPHOS 135* 109  BILITOT 0.7 0.8  PROT 5.1* 4.4*  ALBUMIN 1.9* 1.6*   No results for input(s): LIPASE, AMYLASE in the last 168 hours. No results for input(s): AMMONIA in the last 168 hours. Cardiac Enzymes: No results for input(s): CKTOTAL, CKMB, CKMBINDEX, TROPONINI in the last 168 hours. BNP (last 3 results) Recent Labs    09/29/23 1023 10/23/23 1144 04/03/24 1805  BNP 18.3 22.3 911.0*   CBG: Recent Labs  Lab 04/05/24 0508  GLUCAP 84    Time spent: 35 minutes  Signed:  Elvan Sor  [facility] 04/06/2024 1:11 PM

## 2024-04-07 ENCOUNTER — Other Ambulatory Visit: Payer: Self-pay | Admitting: Family Medicine

## 2024-04-07 ENCOUNTER — Encounter: Payer: Self-pay | Admitting: Emergency Medicine

## 2024-04-07 ENCOUNTER — Other Ambulatory Visit: Payer: Self-pay

## 2024-04-07 ENCOUNTER — Emergency Department

## 2024-04-07 ENCOUNTER — Emergency Department
Admission: EM | Admit: 2024-04-07 | Discharge: 2024-04-07 | Disposition: A | Discharge status: Home / Self Care | Attending: Emergency Medicine | Admitting: Emergency Medicine

## 2024-04-07 DIAGNOSIS — W19XXXA Unspecified fall, initial encounter: Secondary | ICD-10-CM

## 2024-04-07 DIAGNOSIS — R600 Localized edema: Secondary | ICD-10-CM

## 2024-04-07 DIAGNOSIS — S0990XA Unspecified injury of head, initial encounter: Secondary | ICD-10-CM

## 2024-04-07 DIAGNOSIS — Z7901 Long term (current) use of anticoagulants: Secondary | ICD-10-CM | POA: Insufficient documentation

## 2024-04-07 DIAGNOSIS — Y92009 Unspecified place in unspecified non-institutional (private) residence as the place of occurrence of the external cause: Secondary | ICD-10-CM | POA: Insufficient documentation

## 2024-04-07 DIAGNOSIS — I509 Heart failure, unspecified: Secondary | ICD-10-CM | POA: Diagnosis not present

## 2024-04-07 DIAGNOSIS — S0081XA Abrasion of other part of head, initial encounter: Secondary | ICD-10-CM | POA: Insufficient documentation

## 2024-04-07 DIAGNOSIS — W01198A Fall on same level from slipping, tripping and stumbling with subsequent striking against other object, initial encounter: Secondary | ICD-10-CM | POA: Diagnosis not present

## 2024-04-07 DIAGNOSIS — J4489 Other specified chronic obstructive pulmonary disease: Secondary | ICD-10-CM | POA: Diagnosis not present

## 2024-04-07 NOTE — ED Provider Notes (Signed)
 [facility] Provider Note    Event Date/Time   First MD Initiated Contact with Patient 04/07/24 0015     (approximate)   History   Fall   HPI  [facility] Abigail [name] is a 58 y.o. female   Past medical history of multiple medical comorbidities including chronic pain, COPD, CHF, asthma, and PE on Eliquis  who presents to the emergency department with a mechanical fall from home and head strike.  She open the fridge and the door swung open to fast causing her to stumble backwards landed on her buttocks and struck the back of her head.  She is able to get back up with assistance and had been ambulatory since then.  She reports no significant pain.  She knows to come to the emergency department for evaluation given her blood thinner use and even minor head injury.  She reports no other acute medical complaints.   External Medical Documents Reviewed: Discharge summary from yesterday when she was admitted to the hospital for lower extremity edema, diuresis      Physical Exam   Triage Vital Signs: ED Triage Vitals  Encounter Vitals Group     BP 04/07/24 0015 110/61     Girls Systolic BP Percentile --      Girls Diastolic BP Percentile --      Boys Systolic BP Percentile --      Boys Diastolic BP Percentile --      Pulse Rate 04/07/24 0015 93     Resp 04/07/24 0015 16     Temp 04/07/24 0015 99.3 F (37.4 C)     Temp Source 04/07/24 0015 Oral     SpO2 04/07/24 0015 96 %     Weight 04/07/24 0015 127 lb 3.3 oz (57.7 kg)     Height 04/07/24 0015 5' 5 (1.651 m)     Head Circumference --      Peak Flow --      Pain Score 04/07/24 0014 8     Pain Loc --      Pain Education --      Exclude from Growth Chart --     Most recent vital signs: Vitals:   04/07/24 0015  BP: 110/61  Pulse: 93  Resp: 16  Temp: 99.3 F (37.4 C)  SpO2: 96%    General: Awake, no distress.  CV:  Good peripheral perfusion.  Resp:  Normal effort.  Abd:  No distention.   Other:  Awake pleasant comfortable normal vital signs.  There is a small abrasion to the back of the head that is not actively bleeding and very superficial not requiring repair.  There is an old appearing abrasion to the bridge of her nose.  There is no deformity.  Neck is supple with full range of motion and there is some bilateral paraspinal tenderness to palpation in the neck and trapezius.  She is no obvious trauma elsewhere and is able to fully range both upper and lower extremities with full active range of motion and strength, and has a benign abdominal exam, no chest wall tenderness or T or L-spine tenderness or deformity.   ED Results / Procedures / Treatments   Labs (all labs ordered are listed, but only abnormal results are displayed) Labs Reviewed - No data to display      RADIOLOGY I independently reviewed and interpreted CT scan of the head and see no obvious bleeding or midline shift I also reviewed radiologist's formal read.  PROCEDURES:  Critical Care performed: No  Procedures   MEDICATIONS ORDERED IN ED: Medications - No data to display   IMPRESSION / MDM / ASSESSMENT AND PLAN / ED COURSE  I reviewed the triage vital signs and the nursing notes.                                Patient's presentation is most consistent with acute presentation with potential threat to life or bodily function.  Differential diagnosis includes, but is not limited to, mechanical fall leading to head injury, ICH, skull fracture, C-spine injury   MDM:    Mechanical fall leading to head injury in anticoagulated patient.  Very benign physical exam as above for other acute traumatic injuries.  CT of the head and neck unremarkable fortunately.  Patient with minimal pain.  Discharged in stable condition, will follow-up with PMD.       FINAL CLINICAL IMPRESSION(S) / ED DIAGNOSES   Final diagnoses:  Fall, initial encounter  Injury of head, initial encounter     Rx / DC  Orders   ED Discharge Orders     None        Note:  This document was prepared using Dragon voice recognition software and may include unintentional dictation errors.    Cyrena Mylar, MD 04/07/24 [phone]

## 2024-04-07 NOTE — Discharge Instructions (Signed)
 Fortunately your imaging did not show any life-threatening injuries from your fall today.  For pain take Tylenol  650 mg every 6 hours as needed.  Thank you for choosing us  for your health care today!  Please see your primary doctor this week for a follow up appointment.   If you have any new, worsening, or unexpected symptoms call your doctor right away or come back to the emergency department for reevaluation.  It was my pleasure to care for you today.   Ginnie EDISON Cyrena, MD

## 2024-04-07 NOTE — ED Triage Notes (Signed)
 To ER from home via EMS for fall after opening fridge door striking head on counter. On blood thinners. Patient in no distress, alert and oriented.

## 2024-04-08 ENCOUNTER — Ambulatory Visit: Admitting: Family Medicine

## 2024-04-08 ENCOUNTER — Telehealth: Payer: Self-pay | Admitting: *Deleted

## 2024-04-08 NOTE — Transitions of Care (Post Inpatient/ED Visit) (Signed)
   04/08/2024  Name: Abigail [name] [facility] MRN: [id number] DOB: 1966/04/05  Today's TOC FU Call Status: Today's TOC FU Call Status:: Unsuccessful Call (1st Attempt) Unsuccessful Call (1st Attempt) Date: 04/08/24  Attempted to reach the patient regarding the most recent Inpatient visit.  Received automated outgoing voice message stating that the person you are trying to call has a voice mail box that is full; please hang up and try your call again later; unable to leave voice message requesting call back   Follow Up Plan: Additional outreach attempts will be made to reach the patient to complete the Transitions of Care (Post Inpatient/ED visit) call.   Pls call/ message for questions,  Gratia Disla Mckinney Atiya Yera, RN, BSN, CCRN Alumnus RN Care Manager  Transitions of Care  [facility] - [facility] [phone]: direct office

## 2024-04-09 ENCOUNTER — Telehealth: Payer: Self-pay

## 2024-04-09 NOTE — Transitions of Care (Post Inpatient/ED Visit) (Signed)
   04/09/2024  Name: Abigail [name] [facility] MRN: [id number] DOB: May 17, 1966  Today's TOC FU Call Status: Today's TOC FU Call Status:: Unsuccessful Call (2nd Attempt) Unsuccessful Call (2nd Attempt) Date: 04/09/24  Attempted to reach the patient regarding the most recent Inpatient/ED visit.  Follow Up Plan: Additional outreach attempts will be made to reach the patient to complete the Transitions of Care (Post Inpatient/ED visit) call.   Alan Ee, RN, BSN, CEN [employer]- Transition of Care Team.  [facility] [phone]

## 2024-04-10 ENCOUNTER — Telehealth: Payer: Self-pay | Admitting: *Deleted

## 2024-04-10 NOTE — Transitions of Care (Post Inpatient/ED Visit) (Signed)
 04/10/2024  Name: Abigail [name] [facility] MRN: [id number] DOB: 18-Feb-1966  Today's TOC FU Call Status: Today's TOC FU Call Status:: Successful TOC FU Call Completed TOC FU Call Complete Date: 04/10/24 Patient's Name and Date of Birth confirmed.  Transition Care Management Follow-up Telephone Call Date of Discharge: 04/06/24 Discharge Facility: [facility]) Type of Discharge: Inpatient Admission Primary Inpatient Discharge Diagnosis:: Bilateral lower extremity edema; CHF How have you been since you were released from the hospital?: Better (I am getting better slowly; I have home health and a lot of appointments, so I will call Dr. [name] to make my own appointment with her. I have an appointment today with [facility] at 3:00 so I need to call them to find out about that) Any questions or concerns?: No  Items Reviewed: Did you receive and understand the discharge instructions provided?: Yes (Declines review of post-hospital discharge AVS- states not near the papers) Medications obtained,verified, and reconciled?: No (Declines medication review- states not near medication list; reports her mother manages medications and is not currently present to review; she denies questions/ concerns around medications today) Medications Not Reviewed Reasons:: Other: (Patient declined) Any new allergies since your discharge?: No Dietary orders reviewed?: Yes Type of Diet Ordered:: Regular, try to eat healthy Do you have support at home?: Yes People in Home [RPT]: parent(s) Name of Support/Comfort Primary Source: Reports essentially independent in self-care activities; residing temporarily with her mother- assists as/ if needed/ indicated  Medications Reviewed Today: Medications Reviewed Today     Reviewed by Moni Rothrock M, RN (Registered Nurse) on 04/10/24 at 1459  Med List Status: <None>   Medication Order Taking? Sig Documenting Provider Last Dose Status Informant   acetaminophen  (TYLENOL ) 500 MG tablet [id number] No Take 1,000 mg by mouth every 6 (six) hours as needed for moderate pain (pain score 4-6). [provider] 04/02/2024 Morning Active Self  albuterol  (VENTOLIN  HFA) 108 (90 Base) MCG/ACT inhaler [id number] No Inhale 2 puffs into the lungs. Take two (2) puffs by mouth every 4 to 6 hours as needed for wheezing or shortness of breath [provider] Unknown Active Self           Med Note RAYNE ARLEY HERO   Sun Dec 29, 2023  4:09 PM) PRN  AMBULATORY NON FORMULARY MEDICATION [id number]  Knee-high, medium compression, graduated compression stockings. Apply to lower extremities. [email or web address], Zippered Compression Stockings, medium circ, long length [name], Susan K, MD  Active Self  apixaban  (ELIQUIS ) 5 MG TABS tablet [id number] No Take 1 tablet (5 mg total) by mouth 2 (two) times daily. [name], Susan K, MD 04/03/2024 10:00 AM Active Self  bisacodyl  (DULCOLAX) 5 MG EC tablet [id number] No Take 1 tablet (5 mg total) by mouth daily as needed for moderate constipation. Maree Hue, MD Unknown Active Self           Med Note RAYNE, ARLEY HERO   Sun Dec 29, 2023  4:10 PM) PRN  carbamazepine  (TEGRETOL  XR) 100 MG 12 hr tablet [id number]  Take 1 tablet (100 mg total) by mouth 2 (two) times daily. Kandis Devaughn Sayres, MD  Expired 03/25/24 2359   Cholecalciferol  (VITAMIN D -3 PO) [id number] No Take 1 capsule by mouth every morning. [provider] 04/02/2024 Morning Active Self  collagenase  (SANTYL ) 250 UNIT/GM ointment [id number] No Apply 1 Application topically daily. [name], Susan K, MD 04/02/2024 Morning Active Self  DULoxetine  (CYMBALTA ) 60 MG capsule [id number] No Take 1 capsule (60 mg total) by mouth daily.  [name], Susan K, MD 04/02/2024 Morning Active Self  feeding supplement (ENSURE PLUS HIGH PROTEIN) LIQD [id number]  Take 237 mLs by mouth 3 (three) times daily between meals. Von Bellis, MD  Active   furosemide  (LASIX ) 40 MG tablet  [id number] No Take 1 tablet (40 mg total) by mouth daily. [name], Susan K, MD 04/03/2024 11:00 AM Active Self  gabapentin  (NEURONTIN ) 300 MG capsule [id number] No Take 1 capsule (300 mg total) by mouth 4 (four) times daily. Maree Hue, MD 04/03/2024 Morning Expired 04/03/24 2359 Self  lamoTRIgine  (LAMICTAL ) 25 MG tablet [id number] No Take 2 tablets (50 mg total) by mouth 2 (two) times daily. Kandis Devaughn Sayres, MD 04/03/2024 Morning Active Self  levETIRAcetam  (KEPPRA ) 750 MG tablet [id number] No Take 2 tablets (1,500 mg total) by mouth 2 (two) times daily. Kandis Devaughn Sayres, MD 04/03/2024 Morning Active Self  Melatonin 10 MG TABS [id number] No Take 10 mg by mouth at bedtime. [provider] Past Week Active Self  midodrine  (PROAMATINE ) 5 MG tablet [id number]  Take 1 tablet (5 mg total) by mouth 3 (three) times daily as needed (Systolic BP <100 mmHg). Von Bellis, MD  Active   mirtazapine  (REMERON ) 7.5 MG tablet [id number] No Take 1 tablet (7.5 mg total) by mouth at bedtime. [name], Susan K, MD 04/02/2024 Bedtime Active Self  morphine  (MSIR) 15 MG tablet [id number] No Take 1 tablet (15 mg total) by mouth every 8 (eight) hours as needed for severe pain (pain score 7-10). Jens Durand, MD 04/03/2024  3:00 PM Active Self  NON FORMULARY [id number]  Apply 1 Application topically 3 (three) times daily as needed. [employer] (Similar to [employer]) [provider]  Active Self  ondansetron  (ZOFRAN ) 4 MG tablet [id number] No Take 1 tablet (4 mg total) by mouth every 8 (eight) hours as needed for nausea or vomiting. [name], Susan K, MD Past Month Active Self  pantoprazole  (PROTONIX ) 40 MG tablet [id number] No Take 1 tablet (40 mg total) by mouth daily. [name], Susan K, MD 04/03/2024 Morning Active Self  potassium chloride  SA (KLOR-CON  M) 20 MEQ tablet [id number] No Take 1 tablet (20 mEq total) by mouth daily. [name], Susan K, MD 04/03/2024 Morning Active Self           Home Care and  Equipment/Supplies: Were Home Health Services Ordered?: Yes Name of Home Health Agency:: [facility] Has Agency set up a time to come to your home?: Yes First Home Health Visit Date: 04/07/24 Any new equipment or medical supplies ordered?:  (I am not sure; per patient report)  Functional Questionnaire: Do you need assistance with bathing/showering or dressing?: No Do you need assistance with meal preparation?: Yes (mother assisting with meal preparation) Do you need assistance with eating?: No Do you have difficulty maintaining continence: No Do you need assistance with getting out of bed/getting out of a chair/moving?: No Do you have difficulty managing or taking your medications?: Yes (mother currently managing medications)  Follow up appointments reviewed: PCP Follow-up appointment confirmed?: No (Patient declined offered appointment with PCP for hospital follow up per care coordination outreach with scheduling care guide: stated she would call herself later to schedule the appointment) MD Provider Line [phone] Given: No (verified well-established with current PCP) Specialist Hospital Follow-up appointment confirmed?: Yes Date of Specialist follow-up appointment?: 04/08/24 Follow-Up Specialty Provider:: Reports attended neurology provider appointment with Dr. Loreli Do you need transportation to your follow-up appointment?: No Do you understand care options if your condition(s) worsen?: Yes-patient verbalized understanding  SDOH  Interventions Today    Flowsheet Row Most Recent Value  SDOH Interventions   Food Insecurity Interventions Intervention Not Indicated  [Today, patient denies food insecurity]  Housing Interventions Intervention Not Indicated  Transportation Interventions Intervention Not Indicated  [Reports family/ friends provide transportation]  Utilities Interventions Intervention Not Indicated   See TOC assessment tabs for additional assessment/ TOC  intervention information  Patient declines need for ongoing/ further care management/ coordination outreach; declines enrollment in 30-day TOC program- declines taking my direct phone number should needs/ concerns arise post-TOC call   Pls call/ message for questions,  Kaileen Bronkema Mckinney Zaxton Angerer, RN, BSN, Media planner  Transitions of Care  [facility] - [facility] [phone]: direct office

## 2024-04-13 ENCOUNTER — Ambulatory Visit: Payer: Self-pay

## 2024-04-13 NOTE — Telephone Encounter (Signed)
 FYI Only or Action Required?: Action required by provider: request for appointment.  Patient was last seen in primary care on 03/25/2024 by [name], Susan K, MD.  Called Nurse Triage reporting Leg Swelling.  Symptoms began a week ago.  Interventions attempted: Nothing.  Symptoms are: gradually worsening.  Triage Disposition: See Today in Office (overriding See HCP Within 4 Hours (Or PCP Triage))  Patient/caregiver understands and will follow disposition?: Yes No appts -      Copied from CRM #[id number]. Topic: Clinical - Red Word Triage >> Apr 13, 2024  9:09 AM Emylou G wrote: [facility] that prompted transfer to Nurse Triage: Still in pain ( disc disease ).. legs are swollen and off and on seeping.. needs her meds evaluated ( HH visit needs clarity ) Reason for Disposition  SEVERE leg swelling (e.g., swelling extends above knee, entire leg is swollen, weeping fluid)  Answer Assessment - Initial Assessment Questions 1. ONSET: When did the swelling start? (e.g., minutes, hours, days)     Swelling comes and goes  2. LOCATION: What part of the leg is swollen?  Are both legs swollen or just one leg?     Both, thighs down, hand swelling  3. SEVERITY: How bad is the swelling? (e.g., localized; mild, moderate, severe)     Moderate  4. REDNESS: Is there redness or signs of infection?     With sores  5. PAIN: Is the swelling painful to touch? If Yes, ask: How painful is it?   (Scale 1-10; mild, moderate or severe)     7 constant  6. FEVER: Do you have a fever? If Yes, ask: What is it, how was it measured, and when did it start?      no 7. CAUSE: What do you think is causing the leg swelling?     Unknown  8. MEDICAL HISTORY: Do you have a history of blood clots (e.g., DVT), cancer, heart failure, kidney disease, or liver failure?    PE on Eliquis   9. RECURRENT SYMPTOM: Have you had leg swelling before? If Yes, ask: When was the last time? What happened that  time?     Several open sores on legs- right lat leg, 2 on other leg and below knee  10. OTHER SYMPTOMS: Do you have any other symptoms? (e.g., chest pain, difficulty breathing)       No- fell 2 weeks ago  Protocols used: Leg Swelling and Edema-A-AH

## 2024-04-15 ENCOUNTER — Telehealth: Payer: Self-pay | Admitting: Family Medicine

## 2024-04-15 NOTE — Telephone Encounter (Signed)
 Copied from CRM [id number]. Topic: General - Call Back - No Documentation >> Apr 15, 2024  9:30 AM Avram MATSU wrote: Reason for CRM: Fronie is calling from [facility] about a patient needing an approved authorization for an echocardiogram. They would have to reschedule if the referral its not approved. Would like for it to be noted in the appt notes or referral.   Phone: [phone] Fronie Bloodgood is okay to epic chat.

## 2024-04-17 ENCOUNTER — Ambulatory Visit

## 2024-04-24 ENCOUNTER — Ambulatory Visit (INDEPENDENT_AMBULATORY_CARE_PROVIDER_SITE_OTHER): Admitting: Family Medicine

## 2024-04-24 ENCOUNTER — Encounter: Payer: Self-pay | Admitting: Family Medicine

## 2024-04-24 ENCOUNTER — Ambulatory Visit: Payer: Self-pay

## 2024-04-24 VITALS — BP 121/75 | HR 88 | Temp 98.4°F | Resp 18 | Ht 65.0 in | Wt 161.0 lb

## 2024-04-24 DIAGNOSIS — E861 Hypovolemia: Secondary | ICD-10-CM

## 2024-04-24 DIAGNOSIS — D7589 Other specified diseases of blood and blood-forming organs: Secondary | ICD-10-CM

## 2024-04-24 DIAGNOSIS — I5032 Chronic diastolic (congestive) heart failure: Secondary | ICD-10-CM

## 2024-04-24 DIAGNOSIS — E876 Hypokalemia: Secondary | ICD-10-CM

## 2024-04-24 DIAGNOSIS — L97801 Non-pressure chronic ulcer of other part of unspecified lower leg limited to breakdown of skin: Secondary | ICD-10-CM | POA: Diagnosis not present

## 2024-04-24 DIAGNOSIS — I83008 Varicose veins of unspecified lower extremity with ulcer other part of lower leg: Secondary | ICD-10-CM | POA: Diagnosis not present

## 2024-04-24 NOTE — Telephone Encounter (Signed)
 FYI Only or Action Required?: FYI only for provider.  Patient was last seen in primary care on 03/25/2024 by [name], Susan K, MD.  Called Nurse Triage reporting Leg Swelling.  Symptoms began several months ago.  Interventions attempted: Prescription medications: furosemide  .  Symptoms are: stable.  Triage Disposition: See PCP When Office is Open (Within 3 Days)  Patient/caregiver understands and will follow disposition?: Yes Reason for Disposition  [1] MILD swelling of both ankles (i.e., pedal edema) AND [2] new-onset or getting worse  Answer Assessment - Initial Assessment Questions Told if gains more than 3 pounds needs to call, has gained 4 pounds. Has been doing a lot of snacking in the middle of the night. Wanted to know if she should take extra furosemide , advised patient not to do so without consulting PCP. Has appointment this afternoon with PCP  1. ONSET: When did the swelling start? (e.g., minutes, hours, days)     3 months or so  2. LOCATION: What part of the leg is swollen?  Are both legs swollen or just one leg?     Both legs, hands look swollen, swelling in stomach   4. REDNESS: Is there redness or signs of infection?     Unaware  3. PAIN: Is the swelling painful to touch? If Yes, ask: How painful is it?   (Scale 1-10; mild, moderate or severe)     No  4. RECURRENT SYMPTOM: Have you had leg swelling before? If Yes, ask: When was the last time? What happened that time?     Yes  5. OTHER SYMPTOMS: Do you have any other symptoms? (e.g., chest pain, difficulty breathing)       No  Protocols used: Leg Swelling and Edema-A-AH  Copied from CRM #[id number]. Topic: Clinical - Red Word Triage >> Apr 24, 2024 10:20 AM DeAngela L wrote: Red Word that prompted transfer to Nurse Triage: patient has swelling in her legs and a little in her stomach and she is asking if she can double or take one and a half tablets of  furosemide  (LASIX ) 40 MG tablet To help  with her swelling in her legs and stomach

## 2024-04-24 NOTE — Progress Notes (Signed)
 Established Patient Office Visit  Subjective   Patient ID: Abigail [name], female    DOB: 02/05/1966  Age: 58 y.o. MRN: [id number]  Chief Complaint  Patient presents with   Hospitalization Follow-up    HPI Discussed the use of AI scribe software for clinical note transcription with the patient, who gave verbal consent to proceed.  History of Present Illness   Abigail [name] Abigail [name] is a 58 year old female who presents with frequent falls and visual hallucinations.  She experiences frequent falls, including a recent incident that required an ER visit. She reports that after her recent fall, she went to the hospital where she was told that nothing was wrong with her brain and her neck was okay. Despite this, she has bruising on her back from the top to the bottom, which has been improving over time.  She has visual hallucinations, specifically seeing the same cat repeatedly while driving. These hallucinations have been ongoing for several months and may be related to her seizure medication, Keppra . She reports that her Keppra  dose was recently changed from one tablet twice a day to two tablets twice a day after her doctor noticed she was not receiving the correct dose.  She has a history of seizures and emphasizes the importance of taking her medication regularly to prevent severe episodes. Her last significant seizure was discovered by her son, leading to a hospital visit. She is on Keppra  to manage her seizures.  She is on Lasix  for fluid retention, which has been increased from 20 mg to 40 mg daily. This adjustment has improved her symptoms, although she still experiences swelling in her legs, arms, and hands. She monitors her blood pressure three times a day and reports taking midodrine  if her systolic gets below 100 mmHg.    She has a history of low potassium levels, which started two years ago and led to multiple hospital visits. She takes potassium supplements  daily, especially with her Lasix  medication, to manage this issue.  She has a history of shingles and received a vaccine in the past, although not the current Shingrix vaccine. She recalls a previous episode of a rash that was diagnosed as shingles, which was managed with Benadryl for itching.  She mentions dietary habits, including eating [employer], which are high in sodium, and attempts to manage her salt intake to prevent fluid retention. She also notes an increase in her food intake recently, which she attributes to feeling her stomach 'stretch back out.'  She experiences swelling in her hands, which she noticed started last night, and she reports drinking water throughout the day and night. She drinks water to aid the effectiveness of Lasix  but notes that other beverages do not have the same diuretic effect.  She has open wounds on both legs, with one leg worse than the other, and receives care from a nurse named Therisa from [place]. She appreciates the care provided, including the application of Vaseline before wrapping her legs.  No shortness of breath or need to sit up in bed due to fluid overload. No recent pneumonia vaccine and she is a smoker. She experiences brain fog and swelling, particularly in her hands, which started last night.    Objective:     BP 121/75 (BP Location: Right Arm, Patient Position: Sitting, Cuff Size: Normal)   Pulse 88   Temp 98.4 F (36.9 C) (Oral)   Resp 18   Ht 5' 5 (1.651 m)   Wt  161 lb (73 kg)   SpO2 93%   BMI 26.79 kg/m    Physical Exam Vitals and nursing note reviewed.  Constitutional:      Appearance: Normal appearance.  HENT:     Head: Normocephalic and atraumatic.  Eyes:     Conjunctiva/sclera: Conjunctivae normal.  Cardiovascular:     Rate and Rhythm: Normal rate and regular rhythm.  Pulmonary:     Effort: Pulmonary effort is normal.     Breath sounds: Normal breath sounds.  Musculoskeletal:     Right lower leg: No edema.      Left lower leg: No edema.  Skin:    General: Skin is warm and dry.  Neurological:     Mental Status: She is alert and oriented to person, place, and time.  Psychiatric:        Mood and Affect: Mood normal.        Behavior: Behavior normal.        Thought Content: Thought content normal.        Judgment: Judgment normal.          Results for orders placed or performed in visit on 04/24/24  CMP14+EGFR  Result Value Ref Range   Glucose 78 70 - 99 mg/dL   BUN 5 (L) 6 - 24 mg/dL   Creatinine, Ser 9.43 (L) 0.57 - 1.00 mg/dL   eGFR 894 >40 fO/fpw/8.26   BUN/Creatinine Ratio 9 9 - 23   Sodium 137 134 - 144 mmol/L   Potassium 3.7 3.5 - 5.2 mmol/L   Chloride 98 96 - 106 mmol/L   CO2 26 20 - 29 mmol/L   Calcium  7.5 (L) 8.7 - 10.2 mg/dL   Total Protein 4.9 (L) 6.0 - 8.5 g/dL   Albumin 2.3 (L) 3.8 - 4.9 g/dL   Globulin, Total 2.6 1.5 - 4.5 g/dL   Bilirubin Total <9.7 0.0 - 1.2 mg/dL   Alkaline Phosphatase 203 (H) 44 - 121 IU/L   AST 21 0 - 40 IU/L   ALT 9 0 - 32 IU/L  CBC with Differential  Result Value Ref Range   WBC 7.0 3.4 - 10.8 x10E3/uL   RBC 2.82 (L) 3.77 - 5.28 x10E6/uL   Hemoglobin 9.2 (L) 11.1 - 15.9 g/dL   Hematocrit 71.3 (L) 65.9 - 46.6 %   MCV 101 (H) 79 - 97 fL   MCH 32.6 26.6 - 33.0 pg   MCHC 32.2 31.5 - 35.7 g/dL   RDW 87.3 88.2 - 84.5 %   Platelets 300 150 - 450 x10E3/uL   Neutrophils 56 Not Estab. %   Lymphs 30 Not Estab. %   Monocytes 11 Not Estab. %   Eos 2 Not Estab. %   Basos 1 Not Estab. %   Neutrophils Absolute 4.0 1.4 - 7.0 x10E3/uL   Lymphocytes Absolute 2.1 0.7 - 3.1 x10E3/uL   Monocytes Absolute 0.8 0.1 - 0.9 x10E3/uL   EOS (ABSOLUTE) 0.1 0.0 - 0.4 x10E3/uL   Basophils Absolute 0.1 0.0 - 0.2 x10E3/uL   Immature Granulocytes 0 Not Estab. %   Immature Grans (Abs) 0.0 0.0 - 0.1 x10E3/uL  Folate  Result Value Ref Range   Folate 2.2 (L) >3.0 ng/mL  Brain natriuretic peptide  Result Value Ref Range   BNP 38.7 0.0 - 100.0 pg/mL      The  10-year ASCVD risk score (Arnett DK, et al., 2019) is: 11.3%    Assessment & Plan:  Chronic diastolic congestive heart failure (HCC) Assessment &  Plan: She is on Lasix  40 mg daily and 20 mEq of potassium daily.  Please watch your salt intake as this can make you more likely to swell.  She is using wraps on her lower extremities to help with that edema.  Orders: -     Brain natriuretic peptide  Macrocytosis Assessment & Plan: She reports she is not drinking.  Will check B12 and folate levels and repeat her CBC.  Orders: -     CBC with Differential/Platelet -     Vitamin B12; Future -     Folate  Hypokalemia -     CMP14+EGFR  Venous stasis ulcer of other part of lower leg limited to breakdown of skin with varicose veins, unspecified laterality (HCC) Assessment & Plan: She has got venous stasis ulcers on both her lower extremities.  She is using Unna boots to help with the swelling.  Will contact nursing and see how these wounds are progressing.  If she is not progressing we will need to send her to the wound [name].   Hypotension due to hypovolemia Assessment & Plan: She has developed hypotension in response to taking Lasix  for fluid overload.  She has midodrine  which she can take if her systolic blood pressure drops below 100.      Return in about 4 weeks (around 05/22/2024).    Sapphire Tygart K Mita Vallo, MD

## 2024-04-25 LAB — CMP14+EGFR
ALT: 9 IU/L (ref 0–32)
AST: 21 IU/L (ref 0–40)
Albumin: 2.3 g/dL — ABNORMAL LOW (ref 3.8–4.9)
Alkaline Phosphatase: 203 IU/L — ABNORMAL HIGH (ref 44–121)
BUN/Creatinine Ratio: 9 (ref 9–23)
BUN: 5 mg/dL — ABNORMAL LOW (ref 6–24)
Bilirubin Total: <0.2 mg/dL (ref 0.0–1.2)
CO2: 26 mmol/L (ref 20–29)
Calcium: 7.5 mg/dL — ABNORMAL LOW (ref 8.7–10.2)
Chloride: 98 mmol/L (ref 96–106)
Creatinine, Ser: 0.56 mg/dL — ABNORMAL LOW (ref 0.57–1.00)
Globulin, Total: 2.6 g/dL (ref 1.5–4.5)
Glucose: 78 mg/dL (ref 70–99)
Potassium: 3.7 mmol/L (ref 3.5–5.2)
Sodium: 137 mmol/L (ref 134–144)
Total Protein: 4.9 g/dL — ABNORMAL LOW (ref 6.0–8.5)
eGFR: 105 mL/min/1.73 (ref >59)

## 2024-04-25 LAB — CBC WITH DIFFERENTIAL/PLATELET
Basophils Absolute: 0.1 x10E3/uL (ref 0.0–0.2)
Basos: 1 %
EOS (ABSOLUTE): 0.1 x10E3/uL (ref 0.0–0.4)
Eos: 2 %
Hematocrit: 28.6 % — ABNORMAL LOW (ref 34.0–46.6)
Hemoglobin: 9.2 g/dL — ABNORMAL LOW (ref 11.1–15.9)
Immature Grans (Abs): 0 x10E3/uL (ref 0.0–0.1)
Immature Granulocytes: 0 %
Lymphocytes Absolute: 2.1 x10E3/uL (ref 0.7–3.1)
Lymphs: 30 %
MCH: 32.6 pg (ref 26.6–33.0)
MCHC: 32.2 g/dL (ref 31.5–35.7)
MCV: 101 fL — ABNORMAL HIGH (ref 79–97)
Monocytes Absolute: 0.8 x10E3/uL (ref 0.1–0.9)
Monocytes: 11 %
Neutrophils Absolute: 4 x10E3/uL (ref 1.4–7.0)
Neutrophils: 56 %
Platelets: 300 x10E3/uL (ref 150–450)
RBC: 2.82 x10E6/uL — ABNORMAL LOW (ref 3.77–5.28)
RDW: 12.6 % (ref 11.7–15.4)
WBC: 7 x10E3/uL (ref 3.4–10.8)

## 2024-04-25 LAB — FOLATE: Folate: 2.2 ng/mL — ABNORMAL LOW (ref >3.0)

## 2024-04-25 LAB — BRAIN NATRIURETIC PEPTIDE: BNP: 38.7 pg/mL (ref 0.0–100.0)

## 2024-04-27 ENCOUNTER — Encounter: Payer: Self-pay | Admitting: Family Medicine

## 2024-04-27 ENCOUNTER — Other Ambulatory Visit: Payer: Self-pay | Admitting: Family Medicine

## 2024-04-27 ENCOUNTER — Ambulatory Visit
Admission: RE | Admit: 2024-04-27 | Discharge: 2024-04-27 | Disposition: A | Discharge status: Home / Self Care | Source: Ambulatory Visit | Attending: Family Medicine | Admitting: Family Medicine

## 2024-04-27 DIAGNOSIS — Z1231 Encounter for screening mammogram for malignant neoplasm of breast: Secondary | ICD-10-CM | POA: Insufficient documentation

## 2024-04-27 DIAGNOSIS — N6310 Unspecified lump in the right breast, unspecified quadrant: Secondary | ICD-10-CM

## 2024-04-28 ENCOUNTER — Ambulatory Visit: Payer: Self-pay

## 2024-04-28 ENCOUNTER — Telehealth: Payer: Self-pay | Admitting: Family Medicine

## 2024-04-28 DIAGNOSIS — D7589 Other specified diseases of blood and blood-forming organs: Secondary | ICD-10-CM | POA: Insufficient documentation

## 2024-04-28 DIAGNOSIS — I959 Hypotension, unspecified: Secondary | ICD-10-CM | POA: Insufficient documentation

## 2024-04-28 NOTE — Telephone Encounter (Signed)
 Second attempt: mailbox is full and cannot accept messages at this time  Message from [place] R sent at 04/28/2024  4:31 PM EDT  Summary: Medication advice for fluid retention   Pt states she is retaining fluid and wants to know if she may take furosemide  (LASIX ) 40 MG tablet plus an additional 20 mg? Please call pt at [phone]

## 2024-04-28 NOTE — Telephone Encounter (Signed)
  First attempt:mailbox is full and cannot accept messages at this time Message from [place] R sent at 04/28/2024  4:31 PM EDT  Summary: Medication advice for fluid retention   Pt states she is retaining fluid and wants to know if she may take furosemide  (LASIX ) 40 MG tablet plus an additional 20 mg? Please call pt at [phone]

## 2024-04-28 NOTE — Assessment & Plan Note (Signed)
 She is on Lasix  40 mg daily and 20 mEq of potassium daily.  Please watch your salt intake as this can make you more likely to swell.  She is using wraps on her lower extremities to help with that edema.

## 2024-04-28 NOTE — Assessment & Plan Note (Signed)
 She has developed hypotension in response to taking Lasix  for fluid overload.  She has midodrine  which she can take if her systolic blood pressure drops below 100.

## 2024-04-28 NOTE — Assessment & Plan Note (Signed)
 She has got venous stasis ulcers on both her lower extremities.  She is using Unna boots to help with the swelling.  Will contact nursing and see how these wounds are progressing.  If she is not progressing we will need to send her to the wound center.

## 2024-04-28 NOTE — Assessment & Plan Note (Signed)
 She reports she is not drinking.  Will check B12 and folate levels and repeat her CBC.

## 2024-04-28 NOTE — Telephone Encounter (Signed)
 Third attempt: mailbox is full and cannot accept messages at this time  Message forwarded to [facility] from [place] R sent at 04/28/2024  4:31 PM EDT      Summary: Medication advice for fluid retention    Pt states she is retaining fluid and wants to know if she may take furosemide  (LASIX ) 40 MG tablet plus an additional 20 mg? Please call pt at [phone]

## 2024-04-28 NOTE — Telephone Encounter (Signed)
 Abigail [name] states that the venous stasis ulcers are very shallow and have a granulation base.  Santyl  is not needed at this time.  Unna boot are working to get her edema improved.

## 2024-04-29 ENCOUNTER — Encounter: Payer: Self-pay | Admitting: Family Medicine

## 2024-04-29 ENCOUNTER — Ambulatory Visit: Payer: Self-pay | Admitting: Family Medicine

## 2024-04-29 NOTE — Progress Notes (Signed)
 Copied from CRM #[id number]. Topic: General - Other >> Apr 28, 2024  4:33 PM Zebedee SAUNDERS wrote: Reason for CRM: Pt requesting new Home Health Care order due to new insurance with [facility].

## 2024-05-03 ENCOUNTER — Emergency Department

## 2024-05-03 ENCOUNTER — Encounter: Payer: Self-pay | Admitting: Emergency Medicine

## 2024-05-03 ENCOUNTER — Other Ambulatory Visit: Payer: Self-pay

## 2024-05-03 ENCOUNTER — Inpatient Hospital Stay
Admission: EM | Admit: 2024-05-03 | Discharge: 2024-05-06 | DRG: 603 | Disposition: A | Discharge status: Home Health | Attending: Student | Admitting: Student

## 2024-05-03 DIAGNOSIS — M40209 Unspecified kyphosis, site unspecified: Secondary | ICD-10-CM | POA: Diagnosis not present

## 2024-05-03 DIAGNOSIS — E785 Hyperlipidemia, unspecified: Secondary | ICD-10-CM | POA: Diagnosis not present

## 2024-05-03 DIAGNOSIS — R918 Other nonspecific abnormal finding of lung field: Secondary | ICD-10-CM | POA: Diagnosis not present

## 2024-05-03 DIAGNOSIS — Z79899 Other long term (current) drug therapy: Secondary | ICD-10-CM | POA: Diagnosis not present

## 2024-05-03 DIAGNOSIS — M7989 Other specified soft tissue disorders: Principal | ICD-10-CM

## 2024-05-03 DIAGNOSIS — L03115 Cellulitis of right lower limb: Secondary | ICD-10-CM | POA: Diagnosis not present

## 2024-05-03 DIAGNOSIS — J449 Chronic obstructive pulmonary disease, unspecified: Secondary | ICD-10-CM | POA: Diagnosis not present

## 2024-05-03 DIAGNOSIS — Z9109 Other allergy status, other than to drugs and biological substances: Secondary | ICD-10-CM

## 2024-05-03 DIAGNOSIS — F17210 Nicotine dependence, cigarettes, uncomplicated: Secondary | ICD-10-CM | POA: Diagnosis present

## 2024-05-03 DIAGNOSIS — Z85828 Personal history of other malignant neoplasm of skin: Secondary | ICD-10-CM | POA: Diagnosis not present

## 2024-05-03 DIAGNOSIS — E876 Hypokalemia: Secondary | ICD-10-CM | POA: Diagnosis present

## 2024-05-03 DIAGNOSIS — M797 Fibromyalgia: Secondary | ICD-10-CM | POA: Diagnosis present

## 2024-05-03 DIAGNOSIS — Z7901 Long term (current) use of anticoagulants: Secondary | ICD-10-CM

## 2024-05-03 DIAGNOSIS — Z90710 Acquired absence of both cervix and uterus: Secondary | ICD-10-CM

## 2024-05-03 DIAGNOSIS — Z833 Family history of diabetes mellitus: Secondary | ICD-10-CM | POA: Diagnosis not present

## 2024-05-03 DIAGNOSIS — E538 Deficiency of other specified B group vitamins: Secondary | ICD-10-CM | POA: Diagnosis present

## 2024-05-03 DIAGNOSIS — K219 Gastro-esophageal reflux disease without esophagitis: Secondary | ICD-10-CM | POA: Diagnosis present

## 2024-05-03 DIAGNOSIS — F419 Anxiety disorder, unspecified: Secondary | ICD-10-CM | POA: Diagnosis present

## 2024-05-03 DIAGNOSIS — R600 Localized edema: Secondary | ICD-10-CM

## 2024-05-03 DIAGNOSIS — Z86711 Personal history of pulmonary embolism: Secondary | ICD-10-CM | POA: Insufficient documentation

## 2024-05-03 DIAGNOSIS — Z96649 Presence of unspecified artificial hip joint: Secondary | ICD-10-CM | POA: Diagnosis present

## 2024-05-03 DIAGNOSIS — L03119 Cellulitis of unspecified part of limb: Secondary | ICD-10-CM | POA: Diagnosis present

## 2024-05-03 DIAGNOSIS — K589 Irritable bowel syndrome without diarrhea: Secondary | ICD-10-CM | POA: Diagnosis present

## 2024-05-03 DIAGNOSIS — Z8249 Family history of ischemic heart disease and other diseases of the circulatory system: Secondary | ICD-10-CM

## 2024-05-03 DIAGNOSIS — Z886 Allergy status to analgesic agent status: Secondary | ICD-10-CM

## 2024-05-03 DIAGNOSIS — M069 Rheumatoid arthritis, unspecified: Secondary | ICD-10-CM | POA: Diagnosis present

## 2024-05-03 DIAGNOSIS — G8929 Other chronic pain: Secondary | ICD-10-CM | POA: Diagnosis present

## 2024-05-03 DIAGNOSIS — I9589 Other hypotension: Secondary | ICD-10-CM | POA: Diagnosis present

## 2024-05-03 DIAGNOSIS — Z885 Allergy status to narcotic agent status: Secondary | ICD-10-CM

## 2024-05-03 DIAGNOSIS — G40909 Epilepsy, unspecified, not intractable, without status epilepticus: Secondary | ICD-10-CM | POA: Diagnosis not present

## 2024-05-03 DIAGNOSIS — G43909 Migraine, unspecified, not intractable, without status migrainosus: Secondary | ICD-10-CM | POA: Diagnosis present

## 2024-05-03 DIAGNOSIS — M1990 Unspecified osteoarthritis, unspecified site: Secondary | ICD-10-CM | POA: Diagnosis present

## 2024-05-03 DIAGNOSIS — F32A Depression, unspecified: Secondary | ICD-10-CM | POA: Diagnosis not present

## 2024-05-03 DIAGNOSIS — Z8619 Personal history of other infectious and parasitic diseases: Secondary | ICD-10-CM

## 2024-05-03 DIAGNOSIS — Z882 Allergy status to sulfonamides status: Secondary | ICD-10-CM

## 2024-05-03 DIAGNOSIS — R079 Chest pain, unspecified: Secondary | ICD-10-CM | POA: Diagnosis present

## 2024-05-03 DIAGNOSIS — I959 Hypotension, unspecified: Secondary | ICD-10-CM | POA: Diagnosis not present

## 2024-05-03 DIAGNOSIS — M79605 Pain in left leg: Secondary | ICD-10-CM | POA: Diagnosis not present

## 2024-05-03 DIAGNOSIS — Z888 Allergy status to other drugs, medicaments and biological substances status: Secondary | ICD-10-CM

## 2024-05-03 DIAGNOSIS — Z91040 Latex allergy status: Secondary | ICD-10-CM

## 2024-05-03 DIAGNOSIS — N6310 Unspecified lump in the right breast, unspecified quadrant: Secondary | ICD-10-CM

## 2024-05-03 DIAGNOSIS — M79604 Pain in right leg: Secondary | ICD-10-CM | POA: Diagnosis not present

## 2024-05-03 DIAGNOSIS — M7732 Calcaneal spur, left foot: Secondary | ICD-10-CM | POA: Diagnosis not present

## 2024-05-03 DIAGNOSIS — Z981 Arthrodesis status: Secondary | ICD-10-CM

## 2024-05-03 DIAGNOSIS — S92512D Displaced fracture of proximal phalanx of left lesser toe(s), subsequent encounter for fracture with routine healing: Secondary | ICD-10-CM | POA: Diagnosis not present

## 2024-05-03 DIAGNOSIS — L03116 Cellulitis of left lower limb: Secondary | ICD-10-CM | POA: Diagnosis present

## 2024-05-03 LAB — HEPATIC FUNCTION PANEL
ALT: 9 U/L (ref 0–44)
AST: 25 U/L (ref 15–41)
Albumin: 1.9 g/dL — ABNORMAL LOW (ref 3.5–5.0)
Alkaline Phosphatase: 174 U/L — ABNORMAL HIGH (ref 38–126)
Bilirubin, Direct: <0.1 mg/dL (ref 0.0–0.2)
Total Bilirubin: 0.3 mg/dL (ref 0.0–1.2)
Total Protein: 5.5 g/dL — ABNORMAL LOW (ref 6.5–8.1)

## 2024-05-03 LAB — BASIC METABOLIC PANEL WITH GFR
Anion gap: 10 (ref 5–15)
BUN: 7 mg/dL (ref 6–20)
CO2: 28 mmol/L (ref 22–32)
Calcium: 7.7 mg/dL — ABNORMAL LOW (ref 8.9–10.3)
Chloride: 99 mmol/L (ref 98–111)
Creatinine, Ser: 0.67 mg/dL (ref 0.44–1.00)
GFR, Estimated: >60 mL/min (ref >60)
Glucose, Bld: 130 mg/dL — ABNORMAL HIGH (ref 70–99)
Potassium: 3.4 mmol/L — ABNORMAL LOW (ref 3.5–5.1)
Sodium: 137 mmol/L (ref 135–145)

## 2024-05-03 LAB — PROCALCITONIN: Procalcitonin: <0.1 ng/mL

## 2024-05-03 LAB — CBC
HCT: 30.8 % — ABNORMAL LOW (ref 36.0–46.0)
Hemoglobin: 9.7 g/dL — ABNORMAL LOW (ref 12.0–15.0)
MCH: 32 pg (ref 26.0–34.0)
MCHC: 31.5 g/dL (ref 30.0–36.0)
MCV: 101.7 fL — ABNORMAL HIGH (ref 80.0–100.0)
Platelets: 275 K/uL (ref 150–400)
RBC: 3.03 MIL/uL — ABNORMAL LOW (ref 3.87–5.11)
RDW: 13.8 % (ref 11.5–15.5)
WBC: 7.1 K/uL (ref 4.0–10.5)
nRBC: 0 % (ref 0.0–0.2)

## 2024-05-03 LAB — TROPONIN I (HIGH SENSITIVITY): Troponin I (High Sensitivity): 3 ng/L (ref <18)

## 2024-05-03 LAB — BRAIN NATRIURETIC PEPTIDE: B Natriuretic Peptide: 42.6 pg/mL (ref 0.0–100.0)

## 2024-05-03 LAB — LACTIC ACID, PLASMA: Lactic Acid, Venous: 1.2 mmol/L (ref 0.5–1.9)

## 2024-05-03 MED ORDER — MELATONIN 5 MG PO TABS
10.0000 mg | ORAL_TABLET | Freq: Every day | ORAL | Status: DC
  Administered 2024-05-04 – 2024-05-05 (×2): 10 mg via ORAL
  Filled 2024-05-03 (×2): qty 2

## 2024-05-03 MED ORDER — POTASSIUM CHLORIDE CRYS ER 20 MEQ PO TBCR
20.0000 meq | EXTENDED_RELEASE_TABLET | Freq: Every day | ORAL | Status: DC
  Administered 2024-05-03 – 2024-05-06 (×4): 20 meq via ORAL
  Filled 2024-05-03 (×4): qty 1

## 2024-05-03 MED ORDER — ONDANSETRON HCL 4 MG PO TABS: 4.0000 mg | ORAL_TABLET | Freq: Four times a day (QID) | ORAL | Status: DC | PRN

## 2024-05-03 MED ORDER — TRAZODONE HCL 50 MG PO TABS: 25.0000 mg | ORAL_TABLET | Freq: Every evening | ORAL | Status: DC | PRN

## 2024-05-03 MED ORDER — SODIUM CHLORIDE 0.9 % IV SOLN: Freq: Once | INTRAVENOUS | Status: AC

## 2024-05-03 MED ORDER — VITAMIN D 25 MCG (1000 UNIT) PO TABS
5000.0000 [IU] | ORAL_TABLET | Freq: Every morning | ORAL | Status: DC
  Administered 2024-05-04 – 2024-05-06 (×3): 5000 [IU] via ORAL
  Filled 2024-05-03 (×3): qty 5

## 2024-05-03 MED ORDER — ENOXAPARIN SODIUM 40 MG/0.4ML IJ SOSY
40.0000 mg | PREFILLED_SYRINGE | INTRAMUSCULAR | Status: DC
  Administered 2024-05-03: 40 mg via SUBCUTANEOUS
  Filled 2024-05-03: qty 0.4

## 2024-05-03 MED ORDER — MORPHINE SULFATE 15 MG PO TABS
15.0000 mg | ORAL_TABLET | Freq: Three times a day (TID) | ORAL | Status: DC | PRN
  Administered 2024-05-04 – 2024-05-06 (×5): 15 mg via ORAL
  Filled 2024-05-03 (×5): qty 1

## 2024-05-03 MED ORDER — ALBUTEROL SULFATE (2.5 MG/3ML) 0.083% IN NEBU
2.5000 mg | INHALATION_SOLUTION | Freq: Four times a day (QID) | RESPIRATORY_TRACT | Status: DC | PRN
Start: 2024-05-03 — End: 2024-05-06

## 2024-05-03 MED ORDER — ACETAMINOPHEN 325 MG PO TABS
650.0000 mg | ORAL_TABLET | Freq: Four times a day (QID) | ORAL | Status: DC | PRN
  Administered 2024-05-04: 650 mg via ORAL
  Filled 2024-05-03: qty 2

## 2024-05-03 MED ORDER — VANCOMYCIN HCL 500 MG/100ML IV SOLN
500.0000 mg | Freq: Once | INTRAVENOUS | Status: AC
Start: 2024-05-03 — End: 2024-05-04
  Administered 2024-05-03: 500 mg via INTRAVENOUS
  Filled 2024-05-03: qty 100

## 2024-05-03 MED ORDER — MAGNESIUM HYDROXIDE 400 MG/5ML PO SUSP: 30.0000 mL | Freq: Every day | ORAL | Status: DC | PRN

## 2024-05-03 MED ORDER — ACETAMINOPHEN 650 MG RE SUPP: 650.0000 mg | Freq: Four times a day (QID) | RECTAL | Status: DC | PRN

## 2024-05-03 MED ORDER — ONDANSETRON HCL 4 MG/2ML IJ SOLN: 4.0000 mg | Freq: Four times a day (QID) | INTRAMUSCULAR | Status: DC | PRN

## 2024-05-03 MED ORDER — MIDODRINE HCL 5 MG PO TABS
5.0000 mg | ORAL_TABLET | Freq: Three times a day (TID) | ORAL | Status: DC | PRN
Start: 2024-05-03 — End: 2024-05-04

## 2024-05-03 MED ORDER — DULOXETINE HCL 30 MG PO CPEP
60.0000 mg | ORAL_CAPSULE | Freq: Every day | ORAL | Status: DC
  Administered 2024-05-04 – 2024-05-06 (×3): 60 mg via ORAL
  Filled 2024-05-03 (×3): qty 2

## 2024-05-03 MED ORDER — SODIUM CHLORIDE 0.9 % IV BOLUS
500.0000 mL | Freq: Once | INTRAVENOUS | Status: AC
  Administered 2024-05-03: 500 mL via INTRAVENOUS

## 2024-05-03 MED ORDER — BISACODYL 5 MG PO TBEC: 5.0000 mg | DELAYED_RELEASE_TABLET | Freq: Every day | ORAL | Status: DC | PRN

## 2024-05-03 MED ORDER — GABAPENTIN 300 MG PO CAPS
300.0000 mg | ORAL_CAPSULE | Freq: Four times a day (QID) | ORAL | Status: DC
  Administered 2024-05-04 – 2024-05-06 (×9): 300 mg via ORAL
  Filled 2024-05-03 (×10): qty 1

## 2024-05-03 MED ORDER — CARBAMAZEPINE ER 100 MG PO TB12
100.0000 mg | ORAL_TABLET | Freq: Two times a day (BID) | ORAL | Status: DC
  Administered 2024-05-04 – 2024-05-06 (×5): 100 mg via ORAL
  Filled 2024-05-03 (×6): qty 1

## 2024-05-03 MED ORDER — VANCOMYCIN HCL 1500 MG/300ML IV SOLN
1500.0000 mg | INTRAVENOUS | Status: DC
  Administered 2024-05-04 – 2024-05-05 (×2): 1500 mg via INTRAVENOUS
  Filled 2024-05-03 (×3): qty 300

## 2024-05-03 MED ORDER — SODIUM CHLORIDE 0.9 % IV BOLUS
250.0000 mL | Freq: Once | INTRAVENOUS | Status: AC
  Administered 2024-05-03: 250 mL via INTRAVENOUS

## 2024-05-03 MED ORDER — SODIUM CHLORIDE 0.9 % IV SOLN: INTRAVENOUS | Status: DC

## 2024-05-03 MED ORDER — VANCOMYCIN HCL IN DEXTROSE 1-5 GM/200ML-% IV SOLN
1000.0000 mg | Freq: Once | INTRAVENOUS | Status: AC
Start: 2024-05-03 — End: 2024-05-03
  Administered 2024-05-03: 1000 mg via INTRAVENOUS
  Filled 2024-05-03: qty 200

## 2024-05-03 MED ORDER — PANTOPRAZOLE SODIUM 40 MG PO TBEC
40.0000 mg | DELAYED_RELEASE_TABLET | Freq: Every day | ORAL | Status: DC
  Administered 2024-05-04 – 2024-05-06 (×3): 40 mg via ORAL
  Filled 2024-05-03 (×3): qty 1

## 2024-05-03 MED ORDER — LEVETIRACETAM 750 MG PO TABS
1500.0000 mg | ORAL_TABLET | Freq: Two times a day (BID) | ORAL | Status: DC
  Administered 2024-05-04 – 2024-05-06 (×5): 1500 mg via ORAL
  Filled 2024-05-03 (×6): qty 2

## 2024-05-03 MED ORDER — LAMOTRIGINE 25 MG PO TABS
50.0000 mg | ORAL_TABLET | Freq: Two times a day (BID) | ORAL | Status: DC
  Administered 2024-05-04 – 2024-05-06 (×5): 50 mg via ORAL
  Filled 2024-05-03 (×5): qty 2

## 2024-05-03 MED ORDER — MEDIHONEY WOUND/BURN DRESSING EX PSTE
1.0000 | PASTE | Freq: Every day | CUTANEOUS | Status: DC
  Filled 2024-05-03: qty 44

## 2024-05-03 MED ORDER — ENSURE PLUS HIGH PROTEIN PO LIQD
237.0000 mL | Freq: Three times a day (TID) | ORAL | Status: DC
  Administered 2024-05-04 – 2024-05-06 (×8): 237 mL via ORAL

## 2024-05-03 MED ORDER — MIRTAZAPINE 15 MG PO TABS
7.5000 mg | ORAL_TABLET | Freq: Every day | ORAL | Status: DC
  Administered 2024-05-04 – 2024-05-05 (×2): 7.5 mg via ORAL
  Filled 2024-05-03 (×2): qty 1

## 2024-05-03 NOTE — Consult Note (Signed)
 Pharmacy Antibiotic Note  ASSESSMENT: 58 y.o. female with PMH including venous stasis ulcers, LEE, tobacco use, COPD is presenting with cellulitis. Pt is hypotensive and on 2L [place] oxygen . Renal function is at baseline. Pharmacy has been consulted to manage vancomycin  dosing.  Patient was loaded with vancomycin  in the ED.  PLAN: Initiate vancomycin  1500mg  IV q24H eAUC 468, Cmax 37, Cmin 9.4 Scr 0.8, IBW, Vd 0.72 L/kg Follow up culture results to assess for antibiotic optimization. Monitor renal function to assess for any necessary antibiotic dosing changes.  Patient measurements:    Vital signs: Temp: 98 F (36.7 C) (09/14 1737) Temp Source: Oral (09/14 1737) BP: 98/59 (09/14 1930) Pulse Rate: 88 (09/14 1930) Recent Labs  Lab 05/03/24 1600  WBC 7.1  CREATININE 0.67   Estimated Creatinine Clearance: 75.8 mL/min (by C-G formula based on SCr of 0.67 mg/dL).  Allergies: Allergies  Allergen Reactions   Nsaids Hives   Tapentadol Swelling, Rash and Other (See Comments)    Nucynta- Made her deathly sick   Cephalexin  Other (See Comments)    Reaction not cited   Codeine Other (See Comments)    Reaction not cited   Darvocet [Propoxyphene N-Acetaminophen ] Other (See Comments)    Reaction not cited   Latex Other (See Comments)    Reaction not cited   Silicone Other (See Comments)    Reaction not cited   Sulfa Antibiotics Hives   Tape Other (See Comments)    Reaction not cited   Meloxicam Rash    Antimicrobials this admission: Vancomycin  9/14 >>  Dose adjustments this admission: n/a  Microbiology results: n/a  Thank you for allowing pharmacy to be a part of this patient's care.   Will M. Lenon, PharmD, BCPS Clinical Pharmacist 05/03/2024 9:27 PM

## 2024-05-03 NOTE — Assessment & Plan Note (Signed)
-   Will continue PPI therapy.

## 2024-05-03 NOTE — ED Triage Notes (Signed)
 PT reports bilateral lower extremity swelling and chest pain x 1.5 weeks. Pt reports she is taking lasix  at home.

## 2024-05-03 NOTE — Assessment & Plan Note (Signed)
-   Will continue bronchodilator therapy.

## 2024-05-03 NOTE — ED Notes (Signed)
 Pt assisted to [facility] to urinate. Clean brief placed on pt. RN at bedside.

## 2024-05-03 NOTE — ED Provider Notes (Signed)
 [name] Provider Note    Event Date/Time   First MD Initiated Contact with Patient 05/03/24 1716     (approximate)   History   Weakness   HPI  Abigail [name] is a 58 y.o. female who presents to the emergency department today because of concerns for lower extremity swelling, weakness.  The patient states that she has been dealing with swelling in her lower extremities for roughly 6 weeks.  She has spoken to her primary care doctor and is currently taking the Lasix .  Additionally the patient states that she has been having pain in her left heel.  She denies any trauma to her legs.  Did have an episode of chest pain.  She denies any fevers or chills.   Physical Exam   Triage Vital Signs: ED Triage Vitals [05/03/24 1554]  Encounter Vitals Group     BP 111/68     Girls Systolic BP Percentile      Girls Diastolic BP Percentile      Boys Systolic BP Percentile      Boys Diastolic BP Percentile      Pulse Rate 96     Resp 19     Temp 98.6 F (37 C)     Temp Source Oral     SpO2 90 %     Weight      Height      Head Circumference      Peak Flow      Pain Score 8     Pain Loc      Pain Education      Exclude from Growth Chart     Most recent vital signs: Vitals:   05/03/24 1554  BP: 111/68  Pulse: 96  Resp: 19  Temp: 98.6 F (37 C)  SpO2: 90%   General: Awake, alert, oriented. CV:  Good peripheral perfusion. Regular rate and rhythm. Resp:  Normal effort. Lungs clear. Abd:  No distention.  Other:  Bilateral lower extremity edema.    ED Results / Procedures / Treatments   Labs (all labs ordered are listed, but only abnormal results are displayed) Labs Reviewed  BASIC METABOLIC PANEL WITH GFR - Abnormal; Notable for the following components:      Result Value   Potassium 3.4 (*)    Glucose, Bld 130 (*)    Calcium  7.7 (*)    All other components within normal limits  CBC - Abnormal; Notable for the following  components:   RBC 3.03 (*)    Hemoglobin 9.7 (*)    HCT 30.8 (*)    MCV 101.7 (*)    All other components within normal limits  HEPATIC FUNCTION PANEL - Abnormal; Notable for the following components:   Total Protein 5.5 (*)    Albumin 1.9 (*)    Alkaline Phosphatase 174 (*)    All other components within normal limits  BRAIN NATRIURETIC PEPTIDE  LACTIC ACID, PLASMA  PROCALCITONIN  TROPONIN I (HIGH SENSITIVITY)     EKG  I, Guadalupe Eagles, attending physician, personally viewed and interpreted this EKG  EKG Time: 1614 Rate: 90 Rhythm: normal sinus rhythm Axis: left axis deviation Intervals: qtc 440 QRS: low voltage qrs ST changes: no st elevation Impression: abnormal ekg   RADIOLOGY I independently interpreted and visualized the CXR. My interpretation: No pneumonia Radiology interpretation:  IMPRESSION:  Hypoventilatory changes with streaky atelectasis and or scarring at  the bases.   US  Lower ext IMPRESSION:  1.  No evidence of DVT in the visualized segments.  2. Limited evaluation of the calf veins.     PROCEDURES:  Critical Care performed: N    MEDICATIONS ORDERED IN ED: Medications - No data to display   IMPRESSION / MDM / ASSESSMENT AND PLAN / ED COURSE  I reviewed the triage vital signs and the nursing notes.                              Differential diagnosis includes, but is not limited to, kidney failure, heart failure, liver failure, lymphedema, cellulitis  Patient's presentation is most consistent with acute presentation with potential threat to life or bodily function.   The patient is on the cardiac monitor to evaluate for evidence of arrhythmia and/or significant heart rate changes.  Patient presented to the emergency department today with primary concern for weakness as well as lower extremity edema.  On exam patient does have bilateral lower extremity edema.  Patient with low blood pressures here in the emergency department.  Blood  work sent from triage without concerning troponin or BNP elevation. Will check hepatic function panel, will check US  of lower legs.  Ultrasound without DVTs.  Hepatic function panel does show low albumin which is consistent patient's previous values.  I do wonder if patient could have a cellulitis of the lower legs.  This could also potentially explain some of the low blood pressure.  Will start antibiotics.  Dr. Lawence will evaluate for admission.      FINAL CLINICAL IMPRESSION(S) / ED DIAGNOSES   Final diagnoses:  Swelling of lower extremity  Hypotension, unspecified hypotension type       Note:  This document was prepared using Dragon voice recognition software and may include unintentional dictation errors.    Abigail Roberts, MD 05/03/24 2051

## 2024-05-03 NOTE — Assessment & Plan Note (Addendum)
-   Will continue Keppra , Tegretol  XR, Neurontin  and Lamictal .

## 2024-05-03 NOTE — Assessment & Plan Note (Addendum)
-   The patient will be admitted to a medical bed. - Will continue antibiotic therapy with IV vancomycin . - Warm compresses will be applied. - Pain management will be provided. - There is no evidence for DVT in both lower extremities.

## 2024-05-03 NOTE — ED Notes (Signed)
 Pt incontinent of urine. Pt cleaned and clean brief placed on pt at this time. Pt transferred to rm 38 via wc.

## 2024-05-03 NOTE — ED Notes (Signed)
 Pt in bed, iv attempt unsuccessful, iv placed of first attempt using ultrasound.  Pt tolerated well.

## 2024-05-03 NOTE — Assessment & Plan Note (Signed)
Will continue Eliquis. 

## 2024-05-03 NOTE — H&P (Signed)
 [place]   PATIENT NAME: Abigail [name]    MR#:  [id number]  DATE OF BIRTH:  1965/12/14  DATE OF ADMISSION:  05/03/2024  PRIMARY CARE PHYSICIAN: [name], Susan K, MD   Patient is coming from: Home  REQUESTING/REFERRING PHYSICIAN: Floy Roberts, MD  CHIEF COMPLAINT:   Chief Complaint  Patient presents with   Weakness    HISTORY OF PRESENT ILLNESS:  [facility] [place] is a 58 y.o. female with medical history significant for anxiety, osteoarthritis, fibromyalgia, dyslipidemia, IBS, migraine and seizure, back who presented to the emergency room with acute onset of bilateral lower extremity swelling with pain and with associated weakness.  She had mild erythema and warmth.  This has been developing and worsening over the last 6 weeks.  She was given Lasix  by her PCP.  She denies any falls or trauma.  She is complaining of left heel pain.  She admitted to an episode of chest pain without nausea or vomiting or diaphoresis.  No dyspnea or cough or wheezing.  No fever or chills.  No dysuria, hematuria, urinary frequency or urgency.  ED Course: Upon presentation to the emergency room, vital signs were within normal.  Labs revealed potassium of 3.7 and glucose 130 calcium  7.7 and alk phos 174, albumin 1.9 and total protein 5.5.  BNP was 42.6.  High-sensitivity troponin I was 3.  Lactic acid was 1.2.  CBC showed hemoglobin of 9.7 and hematocrit 30.8 close to previous levels. EKG as reviewed by me :  EKG showed normal sinus rhythm with a rate of 90 with low voltage QRS and Q waves anteroseptally. Imaging: 2 view chest x-ray showed hypoventilatory changes with streaky atelectasis and or scarring at the bases.    The patient was given 500 mL IV normal saline bolus followed 125 mL/h and IV vancomycin .  She will be admitted to a medical-surgical bed for further evaluation and management. PAST MEDICAL HISTORY:   Past Medical History:  Diagnosis Date   Anxiety    Arthritis    Back  pain    Basal cell carcinoma 10/04/2021   R axilla - needs excised 11/28/21   Basal cell carcinoma 10/04/2021   L antecubital excised 11/14/21   Diarrhea 11/12/2016   Fibromyalgia    Generalized abdominal pain 11/12/2016   H. pylori infection    Hyperlipidemia    IBS (irritable bowel syndrome)    Infectious colitis 04/29/2016   Migraines    Moderate dehydration 04/29/2016   Muscle pain    Opioid overdose (HCC) 12/07/2022   Reflux    Seizures (HCC)    Unexplained weight loss 11/12/2016    PAST SURGICAL HISTORY:   Past Surgical History:  Procedure Laterality Date   ABDOMINAL HYSTERECTOMY     APPENDECTOMY  2009   C5 FUSION     C6 FUSION     C7 FUSION     COLONOSCOPY  02/2006   COLONOSCOPY WITH PROPOFOL  N/A 12/25/2016   Procedure: COLONOSCOPY WITH PROPOFOL ;  Surgeon: Dessa Reyes ORN, MD;  Location: [facility] ENDOSCOPY;  Service: Endoscopy;  Laterality: N/A;   ESOPHAGOGASTRODUODENOSCOPY (EGD) WITH PROPOFOL  N/A 12/25/2016   Procedure: ESOPHAGOGASTRODUODENOSCOPY (EGD) WITH PROPOFOL ;  Surgeon: Dessa Reyes ORN, MD;  Location: [facility] ENDOSCOPY;  Service: Endoscopy;  Laterality: N/A;   FOOT SURGERY     HIP ARTHROPLASTY     L4 FUSION     L5 FUSION     S1 FUSION      SOCIAL HISTORY:   Social  History   Tobacco Use   Smoking status: Every Day    Current packs/day: 1.00    Types: Cigarettes    Passive exposure: Past   Smokeless tobacco: Never   Tobacco comments:    ELECTRONIC VAPOR  Substance Use Topics   Alcohol use: Not Currently    Comment: OCCASIONALLY    FAMILY HISTORY:   Family History  Problem Relation Age of Onset   Diabetes Father    Hypertension Father     DRUG ALLERGIES:   Allergies  Allergen Reactions   Nsaids Hives   Tapentadol Swelling, Rash and Other (See Comments)    Nucynta- Made her deathly sick   Cephalexin  Other (See Comments)    Reaction not cited   Codeine Other (See Comments)    Reaction not cited   Darvocet [Propoxyphene  N-Acetaminophen ] Other (See Comments)    Reaction not cited   Latex Other (See Comments)    Reaction not cited   Silicone Other (See Comments)    Reaction not cited   Sulfa Antibiotics Hives   Tape Other (See Comments)    Reaction not cited   Meloxicam Rash    REVIEW OF SYSTEMS:   ROS As per history of present illness. All pertinent systems were reviewed above. Constitutional, HEENT, cardiovascular, respiratory, GI, GU, musculoskeletal, neuro, psychiatric, endocrine, integumentary and hematologic systems were reviewed and are otherwise negative/unremarkable except for positive findings mentioned above in the HPI.   MEDICATIONS AT HOME:   Prior to Admission medications   Medication Sig Start Date End Date Taking? Authorizing Provider  acetaminophen  (TYLENOL ) 500 MG tablet Take 1,000 mg by mouth every 6 (six) hours as needed for moderate pain (pain score 4-6).    [provider]  albuterol  (VENTOLIN  HFA) 108 (90 Base) MCG/ACT inhaler Inhale 2 puffs into the lungs. Take two (2) puffs by mouth every 4 to 6 hours as needed for wheezing or shortness of breath    [provider]  AMBULATORY NON FORMULARY MEDICATION Knee-high, medium compression, graduated compression stockings. Apply to lower extremities. [email or web address], Zippered Compression Stockings, medium circ, long length 03/04/24   [name], Susan K, MD  apixaban  (ELIQUIS ) 5 MG TABS tablet Take 1 tablet (5 mg total) by mouth 2 (two) times daily. 03/04/24   [name], Susan K, MD  bisacodyl  (DULCOLAX) 5 MG EC tablet Take 1 tablet (5 mg total) by mouth daily as needed for moderate constipation. 10/04/23   Maree Hue, MD  carbamazepine  (TEGRETOL  XR) 100 MG 12 hr tablet Take 1 tablet (100 mg total) by mouth 2 (two) times daily. 01/02/24 04/24/24  Wouk, Devaughn Sayres, MD  Cholecalciferol  (VITAMIN D -3 PO) Take 1 capsule by mouth every morning.    [provider]  collagenase  (SANTYL ) 250 UNIT/GM ointment Apply 1  Application topically daily. 03/26/24   [name], Susan K, MD  DULoxetine  (CYMBALTA ) 60 MG capsule Take 1 capsule (60 mg total) by mouth daily. 01/17/24   [name], Susan K, MD  feeding supplement (ENSURE PLUS HIGH PROTEIN) LIQD Take 237 mLs by mouth 3 (three) times daily between meals. 04/06/24   Von Bellis, MD  furosemide  (LASIX ) 40 MG tablet TAKE 1 TABLET BY MOUTH EVERY DAY 04/17/24   [name], Susan K, MD  gabapentin  (NEURONTIN ) 300 MG capsule Take 1 capsule (300 mg total) by mouth 4 (four) times daily. 10/04/23 04/24/24  Maree Hue, MD  lamoTRIgine  (LAMICTAL ) 25 MG tablet Take 2 tablets (50 mg total) by mouth 2 (two) times daily. 01/02/24   Wouk,  Devaughn Sayres, MD  levETIRAcetam  (KEPPRA ) 750 MG tablet Take 2 tablets (1,500 mg total) by mouth 2 (two) times daily. 01/02/24   Wouk, Devaughn Sayres, MD  Melatonin 10 MG TABS Take 10 mg by mouth at bedtime.    [provider]  midodrine  (PROAMATINE ) 5 MG tablet Take 1 tablet (5 mg total) by mouth 3 (three) times daily as needed (Systolic BP <100 mmHg). 04/06/24 05/06/24  Von Bellis, MD  mirtazapine  (REMERON ) 7.5 MG tablet TAKE 1 TABLET BY MOUTH AT BEDTIME. 04/17/24   [name], Susan K, MD  mirtazapine  (REMERON ) 7.5 MG tablet Take 1 tablet (7.5 mg total) by mouth at bedtime. 02/11/24   [name], Susan K, MD  morphine  (MSIR) 15 MG tablet Take 1 tablet (15 mg total) by mouth every 8 (eight) hours as needed for severe pain (pain score 7-10). 11/05/23   Jens Durand, MD  NON FORMULARY Apply 1 Application topically 3 (three) times daily as needed. Rock [employer] Cream (Similar to [employer])    [provider]  ondansetron  (ZOFRAN ) 4 MG tablet Take 1 tablet (4 mg total) by mouth every 8 (eight) hours as needed for nausea or vomiting. 01/17/24   [name], Susan K, MD  pantoprazole  (PROTONIX ) 40 MG tablet Take 1 tablet (40 mg total) by mouth daily. 03/11/24   [name], Susan K, MD  potassium chloride  SA (KLOR-CON  M) 20 MEQ tablet TAKE 1 TABLET BY MOUTH EVERY DAY 04/17/24    [name], Susan K, MD      VITAL SIGNS:  Blood pressure (!) 104/57, pulse 80, temperature 98.3 F (36.8 C), temperature source Oral, resp. rate 17, SpO2 98%.  PHYSICAL EXAMINATION:  Physical Exam  GENERAL:  58 y.o.-year-old patient lying in the bed with no acute distress.  EYES: Pupils equal, round, reactive to light and accommodation. No scleral icterus. Extraocular muscles intact.  HEENT: Head atraumatic, normocephalic. Oropharynx and nasopharynx clear.  NECK:  Supple, no jugular venous distention. No thyroid  enlargement, no tenderness.  LUNGS: Normal breath sounds bilaterally, no wheezing, rales,rhonchi or crepitation. No use of accessory muscles of respiration.  CARDIOVASCULAR: Regular rate and rhythm, S1, S2 normal. No murmurs, rubs, or gallops.  ABDOMEN: Soft, nondistended, nontender. Bowel sounds present. No organomegaly or mass.  EXTREMITIES: Bilateral lower extremity edema, with no cyanosis, or clubbing.  NEUROLOGIC: Cranial nerves II through XII are intact. Muscle strength 5/5 in all extremities. Sensation intact. Gait not checked.  PSYCHIATRIC: The patient is alert and oriented x 3.  Normal affect and good eye contact. SKIN: Bilateral leg erythema, swelling, induration, erythema and tenderness.  LABORATORY PANEL:   CBC Recent Labs  Lab 05/03/24 1600  WBC 7.1  HGB 9.7*  HCT 30.8*  PLT 275   ------------------------------------------------------------------------------------------------------------------  Chemistries  Recent Labs  Lab 05/03/24 1600  NA 137  K 3.4*  CL 99  CO2 28  GLUCOSE 130*  BUN 7  CREATININE 0.67  CALCIUM  7.7*  AST 25  ALT 9  ALKPHOS 174*  BILITOT 0.3   ------------------------------------------------------------------------------------------------------------------  Cardiac Enzymes No results for input(s): TROPONINI in the last 168  hours. ------------------------------------------------------------------------------------------------------------------  RADIOLOGY:  US  Venous Img Lower Bilateral Result Date: 05/03/2024 EXAM: ULTRASOUND DUPLEX OF THE BILATERAL LOWER EXTREMITY VEINS TECHNIQUE: Duplex ultrasound using B-mode/gray scaled imaging and Doppler spectral analysis and color flow was obtained of the deep venous structures of the bilateral lower extremity. COMPARISON: 04/03/2024 CLINICAL HISTORY: Swelling, pain. FINDINGS: The visualized veins of the lower extremity are patent and free of echogenic thrombus. The veins demonstrate  good compressibility with normal color flow study and spectral analysis. The calf veins are not well visualized. IMPRESSION: 1. No evidence of DVT in the visualized segments. 2. Limited evaluation of the calf veins. Electronically signed by: Lonni Necessary MD 05/03/2024 06:59 PM EDT RP Workstation: [id number]   DG Chest 2 View Result Date: 05/03/2024 CLINICAL DATA:  Shortness of breath lower extremity swelling EXAM: CHEST - 2 VIEW COMPARISON:  04/03/2024, CT chest 12/29/2023 FINDINGS: Hypoventilatory changes. Streaky atelectasis and or scarring at the bases. No consolidation, pleural effusion or pneumothorax. Probable soft tissue artifact lucency at the left CP angle. Chronic kyphosis and compression deformities at the thoracolumbar region. Hardware at the cervicothoracic spine. IMPRESSION: Hypoventilatory changes with streaky atelectasis and or scarring at the bases. Electronically Signed   By: Luke Bun M.D.   On: 05/03/2024 17:13      IMPRESSION AND PLAN:  Assessment and Plan: * Cellulitis of lower extremity - The patient will be admitted to a medical bed. - Will continue antibiotic therapy with IV vancomycin . - Warm compresses will be applied. - Pain management will be provided. - There is no evidence for DVT in both lower extremities.  Chronic obstructive pulmonary disease (COPD)  (HCC) - Will continue bronchodilator therapy.  GERD without esophagitis - Will continue PPI therapy.  Seizure disorder (HCC) - Will continue Keppra , Tegretol  XR, Neurontin  and Lamictal .  History of pulmonary embolus (PE) - Will continue Eliquis    DVT prophylaxis: Lovenox .  Advanced Care Planning:  Code Status: full code.  Family Communication:  The plan of care was discussed in details with the patient (and family). I answered all questions. The patient agreed to proceed with the above mentioned plan. Further management will depend upon hospital course. Disposition Plan: Back to previous home environment Consults called: none.  All the records are reviewed and case discussed with ED provider.  Status is: Inpatient  At the time of the admission, it appears that the appropriate admission status for this patient is inpatient.  This is judged to be reasonable and necessary in order to provide the required intensity of service to ensure the patient's safety given the presenting symptoms, physical exam findings and initial radiographic and laboratory data in the context of comorbid conditions.  The patient requires inpatient status due to high intensity of service, high risk of further deterioration and high frequency of surveillance required.  I certify that at the time of admission, it is my clinical judgment that the patient will require inpatient hospital care extending more than 2 midnights.                            Dispo: The patient is from: Home              Anticipated d/c is to: Home              Patient currently is not medically stable to d/c.              Difficult to place patient: No  Madison DELENA Peaches M.D on 05/03/2024 at 10:21 PM  [facility]   From 7 PM-7 AM, contact night-coverage [email or web address]  CC: Primary care physician; [name], Susan K, MD

## 2024-05-04 ENCOUNTER — Other Ambulatory Visit: Payer: Self-pay

## 2024-05-04 DIAGNOSIS — L03119 Cellulitis of unspecified part of limb: Secondary | ICD-10-CM | POA: Diagnosis not present

## 2024-05-04 LAB — CBC
HCT: 26.3 % — ABNORMAL LOW (ref 36.0–46.0)
Hemoglobin: 8.3 g/dL — ABNORMAL LOW (ref 12.0–15.0)
MCH: 32 pg (ref 26.0–34.0)
MCHC: 31.6 g/dL (ref 30.0–36.0)
MCV: 101.5 fL — ABNORMAL HIGH (ref 80.0–100.0)
Platelets: 218 K/uL (ref 150–400)
RBC: 2.59 MIL/uL — ABNORMAL LOW (ref 3.87–5.11)
RDW: 13.9 % (ref 11.5–15.5)
WBC: 6.2 K/uL (ref 4.0–10.5)
nRBC: 0 % (ref 0.0–0.2)

## 2024-05-04 LAB — TROPONIN I (HIGH SENSITIVITY)
Troponin I (High Sensitivity): 4 ng/L (ref <18)
Troponin I (High Sensitivity): 4 ng/L (ref <18)
Troponin I (High Sensitivity): 4 ng/L (ref <18)

## 2024-05-04 LAB — BASIC METABOLIC PANEL WITH GFR
Anion gap: 6 (ref 5–15)
BUN: 6 mg/dL (ref 6–20)
CO2: 30 mmol/L (ref 22–32)
Calcium: 7.2 mg/dL — ABNORMAL LOW (ref 8.9–10.3)
Chloride: 105 mmol/L (ref 98–111)
Creatinine, Ser: 0.49 mg/dL (ref 0.44–1.00)
GFR, Estimated: >60 mL/min (ref >60)
Glucose, Bld: 80 mg/dL (ref 70–99)
Potassium: 3.4 mmol/L — ABNORMAL LOW (ref 3.5–5.1)
Sodium: 141 mmol/L (ref 135–145)

## 2024-05-04 LAB — MAGNESIUM: Magnesium: 1.8 mg/dL (ref 1.7–2.4)

## 2024-05-04 LAB — MRSA NEXT GEN BY PCR, NASAL: MRSA by PCR Next Gen: DETECTED — AB

## 2024-05-04 LAB — PHOSPHORUS: Phosphorus: 2.6 mg/dL (ref 2.5–4.6)

## 2024-05-04 MED ORDER — SODIUM CHLORIDE 0.9 % IV BOLUS
500.0000 mL | Freq: Once | INTRAVENOUS | Status: AC
  Administered 2024-05-04: 500 mL via INTRAVENOUS

## 2024-05-04 MED ORDER — ACETAMINOPHEN 325 MG PO TABS
650.0000 mg | ORAL_TABLET | Freq: Three times a day (TID) | ORAL | Status: DC
  Administered 2024-05-04 – 2024-05-06 (×7): 650 mg via ORAL
  Filled 2024-05-04 (×7): qty 2

## 2024-05-04 MED ORDER — MIDODRINE HCL 5 MG PO TABS
10.0000 mg | ORAL_TABLET | Freq: Three times a day (TID) | ORAL | Status: DC
  Administered 2024-05-04 – 2024-05-06 (×7): 10 mg via ORAL
  Filled 2024-05-04 (×7): qty 2

## 2024-05-04 MED ORDER — MUPIROCIN 2 % EX OINT
1.0000 | TOPICAL_OINTMENT | Freq: Two times a day (BID) | CUTANEOUS | Status: DC
  Administered 2024-05-04 – 2024-05-06 (×5): 1 via NASAL
  Filled 2024-05-04: qty 22

## 2024-05-04 MED ORDER — HYDROMORPHONE HCL 1 MG/ML IJ SOLN: 1.0000 mg | Freq: Two times a day (BID) | INTRAMUSCULAR | Status: DC | PRN

## 2024-05-04 MED ORDER — FOLIC ACID 1 MG PO TABS
1.0000 mg | ORAL_TABLET | Freq: Every day | ORAL | Status: DC
Start: 2024-05-04 — End: 2024-05-06
  Administered 2024-05-04 – 2024-05-06 (×3): 1 mg via ORAL
  Filled 2024-05-04 (×3): qty 1

## 2024-05-04 MED ORDER — HYDROMORPHONE HCL 1 MG/ML IJ SOLN
0.5000 mg | Freq: Two times a day (BID) | INTRAMUSCULAR | Status: DC | PRN
  Administered 2024-05-04: 0.5 mg via INTRAVENOUS
  Filled 2024-05-04: qty 1

## 2024-05-04 MED ORDER — FUROSEMIDE 40 MG PO TABS
40.0000 mg | ORAL_TABLET | Freq: Every day | ORAL | Status: DC
  Administered 2024-05-04: 40 mg via ORAL
  Filled 2024-05-04: qty 1

## 2024-05-04 MED ORDER — CHLORHEXIDINE GLUCONATE CLOTH 2 % EX PADS
6.0000 | MEDICATED_PAD | Freq: Every day | CUTANEOUS | Status: DC
  Administered 2024-05-04: 6 via TOPICAL

## 2024-05-04 MED ORDER — SODIUM CHLORIDE 0.9 % IV SOLN
1.0000 g | INTRAVENOUS | Status: DC
  Administered 2024-05-04 – 2024-05-05 (×2): 1 g via INTRAVENOUS
  Filled 2024-05-04 (×3): qty 10

## 2024-05-04 MED ORDER — APIXABAN 5 MG PO TABS
5.0000 mg | ORAL_TABLET | Freq: Two times a day (BID) | ORAL | Status: DC
  Administered 2024-05-04 – 2024-05-06 (×5): 5 mg via ORAL
  Filled 2024-05-04 (×5): qty 1

## 2024-05-04 MED ORDER — METHYLPREDNISOLONE SODIUM SUCC 125 MG IJ SOLR
125.0000 mg | Freq: Once | INTRAMUSCULAR | Status: AC
  Administered 2024-05-04: 125 mg via INTRAVENOUS
  Filled 2024-05-04: qty 2

## 2024-05-04 NOTE — Plan of Care (Signed)
  Problem: Education: Goal: Knowledge of General Education information will improve Description: Including pain rating scale, medication(s)/side effects and non-pharmacologic comfort measures Outcome: Progressing   Problem: Clinical Measurements: Goal: Cardiovascular complication will be avoided Outcome: Progressing   Problem: Activity: Goal: Risk for activity intolerance will decrease Outcome: Progressing   Problem: Nutrition: Goal: Adequate nutrition will be maintained Outcome: Progressing   Problem: Elimination: Goal: Will not experience complications related to bowel motility Outcome: Progressing Goal: Will not experience complications related to urinary retention Outcome: Progressing   Problem: Safety: Goal: Ability to remain free from injury will improve Outcome: Progressing   Problem: Skin Integrity: Goal: Risk for impaired skin integrity will decrease Outcome: Progressing

## 2024-05-04 NOTE — Progress Notes (Signed)
 While this RN was at lunch patient c/o chest pain that was sharp, BP 93/51. Ileana, RN who was the nurse covering for this RN notified Dr. Von and MD ordred EKG, troponin level and fluid bolus. Unable to administer pain meds due to low BP.

## 2024-05-04 NOTE — Progress Notes (Addendum)
 [facility] Progress Note  Patient: Abigail [name] [facility]    FMW:[id number]  DOA: 05/03/2024     Date of Service: the patient was seen and examined on 05/04/2024  Chief Complaint  Patient presents with   Weakness   Brief hospital course: Sabrine [facility] is a 58 y.o. female with MH of PE on DOAC, Anxiety, osteoarthritis, fibromyalgia, dyslipidemia, IBS, migraine and seizure, back who presented to the emergency room with acute onset of bilateral lower extremity swelling with pain and with associated weakness.  She had mild erythema and warmth.  This has been developing and worsening over the last 6 weeks.  She was given Lasix  by her PCP.  She denies any falls or trauma.  She is complaining of left heel pain.  She admitted to an episode of chest pain without nausea or vomiting or diaphoresis.  No dyspnea or cough or wheezing.  No fever or chills.  No dysuria, hematuria, urinary frequency or urgency.   ED Course: Upon presentation to the emergency room, vital signs were within normal.  Labs revealed potassium of 3.7 and glucose 130 calcium  7.7 and alk phos 174, albumin 1.9 and total protein 5.5.  BNP was 42.6.  High-sensitivity troponin I was 3.  Lactic acid was 1.2.  CBC showed hemoglobin of 9.7 and hematocrit 30.8 close to previous levels. EKG as reviewed by me :  EKG showed normal sinus rhythm with a rate of 90 with low voltage QRS and Q waves anteroseptally. Imaging: 2 view chest x-ray showed hypoventilatory changes with streaky atelectasis and or scarring at the bases.     The patient was given 500 mL IV normal saline bolus followed 125 mL/h and IV vancomycin .  She will be admitted to a medical-surgical bed for further evaluation and management.   Assessment and Plan:   # Cellulitis of lower extremity 9/14 Vancomycin  IV  9/15 started ceftriaxone  1 g IV daily MRSA screen positive Pharmacy consulted for dosing and trough monitor Venous duplex negative for DVT Continued  home dose Lasix  40 mg p.o. daily  # Hypokalemia, mild, potassium repleted. Check electrolytes daily and replete as needed.  # Chronic hypotension Patient is on midodrine  as needed at home 9/15 BP soft, started midodrine  10 mg p.o. 3 times daily with holding parameters Monitor BP and titrate medications accordingly  # Chronic obstructive pulmonary disease (COPD) - continue bronchodilator therapy.   # GERD without esophagitis - continue PPI therapy.   # Seizure disorder - continue Keppra , Tegretol  XR, Neurontin  and Lamictal .   # History of pulmonary embolus (PE) - continued Eliquis    # Fibromyalgia, rheumatoid arthritis, chronic pain on morphine  IR 15 mg p.o. 3 times daily as needed at home which has been resumed Tylenol  650 mg p.o. 3 times daily scheduled and Dilaudid  every 12 hourly as needed for breakthrough pain  # Depression and anxiety: Continue Cymbalta  and Remeron   # Folic acid  deficiency, folic acid  level 2.2.  Started folic acid  1 mg p.o. daily.  Follow with PCP to repeat folic acid  level after 3 to 6 months.   There is no height or weight on file to calculate BMI.  Interventions:  Diet: Regular diet DVT Prophylaxis: Eliquis   Advance goals of care discussion: Full code  Family Communication: family was not present at bedside, at the time of interview.  The pt provided permission to discuss medical plan with the family. Opportunity was given to ask question and all questions were answered satisfactorily.   Disposition:  Pt is from Home, admitted with LE cellulitis, still on IV antibiotics, which precludes a safe discharge. Discharge to Home, when stable, most likely in few days.  Subjective: No significant events overnight, patient still has pain in the bilateral legs, left legs hurting more than the right leg, pain is 3/10, denies any other complaints.   Physical Exam: General: NAD, lying comfortably Appear in no distress, affect appropriate Eyes:  PERRLA ENT: Oral Mucosa Clear, moist  Neck: no JVD,  Cardiovascular: S1 and S2 Present, no Murmur,  Respiratory: good respiratory effort, Bilateral Air entry equal and Decreased, no Crackles, no wheezes Abdomen: Bowel Sound present, Soft and no tenderness,  Skin: no rashes Extremities: b/l LE erythema and tenderness  L>R, mild edema, no calf tenderness Neurologic: without any new focal findings Gait not checked due to patient safety concerns  Vitals:   05/04/24 0700 05/04/24 0730 05/04/24 0752 05/04/24 0820  BP: 90/78 115/67  113/63  Pulse: 88 80  83  Resp: 19 18  17   Temp:   98.1 F (36.7 C) 98.2 F (36.8 C)  TempSrc:   Oral Oral  SpO2: 100% 96%  100%   No intake or output data in the 24 hours ending 05/04/24 1205 There were no vitals filed for this visit.  Data Reviewed: I have personally reviewed and interpreted daily labs, tele strips, imagings as discussed above. I reviewed all nursing notes, pharmacy notes, vitals, pertinent old records I have discussed plan of care as described above with RN and patient/family.  CBC: Recent Labs  Lab 05/03/24 1600 05/04/24 0433  WBC 7.1 6.2  HGB 9.7* 8.3*  HCT 30.8* 26.3*  MCV 101.7* 101.5*  PLT 275 218   Basic Metabolic Panel: Recent Labs  Lab 05/03/24 1600 05/04/24 0432 05/04/24 0433  NA 137  --  141  K 3.4*  --  3.4*  CL 99  --  105  CO2 28  --  30  GLUCOSE 130*  --  80  BUN 7  --  6  CREATININE 0.67  --  0.49  CALCIUM  7.7*  --  7.2*  MG  --  1.8  --   PHOS  --  2.6  --     Studies: US  Venous Img Lower Bilateral Result Date: 05/03/2024 EXAM: ULTRASOUND DUPLEX OF THE BILATERAL LOWER EXTREMITY VEINS TECHNIQUE: Duplex ultrasound using B-mode/gray scaled imaging and Doppler spectral analysis and color flow was obtained of the deep venous structures of the bilateral lower extremity. COMPARISON: 04/03/2024 CLINICAL HISTORY: Swelling, pain. FINDINGS: The visualized veins of the lower extremity are patent and free of  echogenic thrombus. The veins demonstrate good compressibility with normal color flow study and spectral analysis. The calf veins are not well visualized. IMPRESSION: 1. No evidence of DVT in the visualized segments. 2. Limited evaluation of the calf veins. Electronically signed by: Lonni Necessary MD 05/03/2024 06:59 PM EDT RP Workstation: [id number]   DG Chest 2 View Result Date: 05/03/2024 CLINICAL DATA:  Shortness of breath lower extremity swelling EXAM: CHEST - 2 VIEW COMPARISON:  04/03/2024, CT chest 12/29/2023 FINDINGS: Hypoventilatory changes. Streaky atelectasis and or scarring at the bases. No consolidation, pleural effusion or pneumothorax. Probable soft tissue artifact lucency at the left CP angle. Chronic kyphosis and compression deformities at the thoracolumbar region. Hardware at the cervicothoracic spine. IMPRESSION: Hypoventilatory changes with streaky atelectasis and or scarring at the bases. Electronically Signed   By: Luke Bun M.D.   On: 05/03/2024 17:13    Scheduled Meds:  apixaban   5 mg Oral BID   carbamazepine   100 mg Oral BID   Chlorhexidine  Gluconate Cloth  6 each Topical Daily   cholecalciferol   5,000 Units Oral q morning   DULoxetine   60 mg Oral Daily   feeding supplement  237 mL Oral TID BM   folic acid   1 mg Oral Daily   gabapentin   300 mg Oral QID   lamoTRIgine   50 mg Oral BID   levETIRAcetam   1,500 mg Oral BID   melatonin  10 mg Oral QHS   mirtazapine   7.5 mg Oral QHS   mupirocin  ointment  1 Application Nasal BID   pantoprazole   40 mg Oral Daily   potassium chloride  SA  20 mEq Oral Daily   Continuous Infusions:  sodium chloride  100 mL/hr at 05/04/24 0836   vancomycin      PRN Meds: acetaminophen  **OR** acetaminophen , albuterol , bisacodyl , magnesium  hydroxide, midodrine , morphine , ondansetron  **OR** ondansetron  (ZOFRAN ) IV, traZODone   Time spent: 55 minutes  Author: ELVAN SOR. MD [facility] 05/04/2024 12:05 PM  To reach On-call, see  care teams to locate the attending and reach out to them via [email or web address]. If 7PM-7AM, please contact night-coverage If you still have difficulty reaching the attending provider, please page the [facility] (Director on Call) for [facility] on amion for assistance.

## 2024-05-04 NOTE — Progress Notes (Signed)
 Dr. Von gave order to discontinue medihoney since patient does not have any open wounds.

## 2024-05-04 NOTE — Progress Notes (Signed)
 Made Dr. Von aware of BP 95/50 after bolus and evening midodrine . Patient denies chest pain at this time. MD acknowledged and placed order for IV fluid bolus.

## 2024-05-04 NOTE — ED Notes (Addendum)
 Previous nurse stated she didn't give pt meds from 2200-2100 due to her blood pressure.

## 2024-05-05 DIAGNOSIS — L03119 Cellulitis of unspecified part of limb: Secondary | ICD-10-CM | POA: Diagnosis not present

## 2024-05-05 LAB — BASIC METABOLIC PANEL WITH GFR
Anion gap: 6 (ref 5–15)
BUN: 7 mg/dL (ref 6–20)
CO2: 29 mmol/L (ref 22–32)
Calcium: 7.6 mg/dL — ABNORMAL LOW (ref 8.9–10.3)
Chloride: 106 mmol/L (ref 98–111)
Creatinine, Ser: 0.4 mg/dL — ABNORMAL LOW (ref 0.44–1.00)
GFR, Estimated: >60 mL/min (ref >60)
Glucose, Bld: 124 mg/dL — ABNORMAL HIGH (ref 70–99)
Potassium: 3.4 mmol/L — ABNORMAL LOW (ref 3.5–5.1)
Sodium: 141 mmol/L (ref 135–145)

## 2024-05-05 LAB — CBC
HCT: 32.2 % — ABNORMAL LOW (ref 36.0–46.0)
Hemoglobin: 10.1 g/dL — ABNORMAL LOW (ref 12.0–15.0)
MCH: 31.7 pg (ref 26.0–34.0)
MCHC: 31.4 g/dL (ref 30.0–36.0)
MCV: 100.9 fL — ABNORMAL HIGH (ref 80.0–100.0)
Platelets: 262 K/uL (ref 150–400)
RBC: 3.19 MIL/uL — ABNORMAL LOW (ref 3.87–5.11)
RDW: 13.9 % (ref 11.5–15.5)
WBC: 8.3 K/uL (ref 4.0–10.5)
nRBC: 0 % (ref 0.0–0.2)

## 2024-05-05 LAB — PHOSPHORUS: Phosphorus: 2.8 mg/dL (ref 2.5–4.6)

## 2024-05-05 LAB — MAGNESIUM: Magnesium: 1.9 mg/dL (ref 1.7–2.4)

## 2024-05-05 MED ORDER — FUROSEMIDE 10 MG/ML IJ SOLN
8.0000 mg/h | INTRAVENOUS | Status: DC
  Administered 2024-05-05: 8 mg/h via INTRAVENOUS
  Filled 2024-05-05: qty 20

## 2024-05-05 MED ORDER — POTASSIUM CHLORIDE CRYS ER 20 MEQ PO TBCR
40.0000 meq | EXTENDED_RELEASE_TABLET | Freq: Once | ORAL | Status: AC
Start: 2024-05-05 — End: 2024-05-05
  Administered 2024-05-05: 40 meq via ORAL
  Filled 2024-05-05: qty 2

## 2024-05-05 NOTE — Progress Notes (Signed)
 [facility] Progress Note  Patient: Abigail [name] [facility]    FMW:[id number]  DOA: 05/03/2024     Date of Service: the patient was seen and examined on 05/05/2024  Chief Complaint  Patient presents with   Weakness   Brief hospital course: [facility] is a 58 y.o. female with MH of PE on DOAC, Anxiety, osteoarthritis, fibromyalgia, dyslipidemia, IBS, migraine and seizure, back who presented to the emergency room with acute onset of bilateral lower extremity swelling with pain and with associated weakness.  She had mild erythema and warmth.  This has been developing and worsening over the last 6 weeks.  She was given Lasix  by her PCP.  She denies any falls or trauma.  She is complaining of left heel pain.  She admitted to an episode of chest pain without nausea or vomiting or diaphoresis.  No dyspnea or cough or wheezing.  No fever or chills.  No dysuria, hematuria, urinary frequency or urgency.   ED Course: Upon presentation to the emergency room, vital signs were within normal.  Labs revealed potassium of 3.7 and glucose 130 calcium  7.7 and alk phos 174, albumin 1.9 and total protein 5.5.  BNP was 42.6.  High-sensitivity troponin I was 3.  Lactic acid was 1.2.  CBC showed hemoglobin of 9.7 and hematocrit 30.8 close to previous levels. EKG as reviewed by me :  EKG showed normal sinus rhythm with a rate of 90 with low voltage QRS and Q waves anteroseptally. Imaging: 2 view chest x-ray showed hypoventilatory changes with streaky atelectasis and or scarring at the bases.     The patient was given 500 mL IV normal saline bolus followed 125 mL/h and IV vancomycin .  She will be admitted to a medical-surgical bed for further evaluation and management.   Assessment and Plan:   # Cellulitis of lower extremity 9/14 Vancomycin  IV  9/15 started ceftriaxone  1 g IV daily MRSA screen positive Pharmacy consulted for dosing and trough monitor Venous duplex negative for DVT S/p Lasix   40 mg p.o. daily  # Lower extremity edema Patient was on Lasix  40 mg at home which was held due to soft blood pressure 9/16 started Lasix  IV infusion Monitor BP, urine output and electrolytes. Continue Ace wraps and keep legs elevated  # Hypokalemia, mild, potassium repleted. Check electrolytes daily and replete as needed.  # Chronic hypotension Patient is on midodrine  as needed at home 9/15 BP soft, started midodrine  10 mg p.o. 3 times daily with holding parameters Monitor BP and titrate medications accordingly  # Chronic obstructive pulmonary disease (COPD) - continue bronchodilator therapy.   # GERD without esophagitis - continue PPI therapy.   # Seizure disorder - continue Keppra , Tegretol  XR, Neurontin  and Lamictal .   # History of pulmonary embolus (PE) - continued Eliquis  Hematology referral as an outpatient.    # Fibromyalgia, rheumatoid arthritis, chronic pain on morphine  IR 15 mg p.o. 3 times daily as needed at home which has been resumed Tylenol  650 mg p.o. 3 times daily scheduled and Dilaudid  every 12 hourly as needed for breakthrough pain As per patient she is already following with a rheumatologist  # Depression and anxiety: Continue Cymbalta  and Remeron   # Folic acid  deficiency, folic acid  level 2.2.  Started folic acid  1 mg p.o. daily.  Follow with PCP to repeat folic acid  level after 3 to 6 months.   Body mass index is 28.65 kg/m.  Interventions:  Diet: Regular diet DVT Prophylaxis: Eliquis   Advance  goals of care discussion: Full code  Family Communication: family was present at bedside, at the time of interview.  The pt provided permission to discuss medical plan with the family. Opportunity was given to ask question and all questions were answered satisfactorily.  9/16 d/w patient's mother at bedside  Disposition:  Pt is from Home, admitted with LE cellulitis, still on IV antibiotics, which precludes a safe discharge. Discharge to Home, when  stable, most likely in few days.  Subjective: No significant events overnight, patient was resting comfortably, she just woke up during my interview.  Denied any specific complaint, patient seems sleepy and lethargic. Management plan discussed with patient's mother at bedside.  Physical Exam: General: NAD, lying comfortably Appear in no distress, affect appropriate Eyes: PERRLA ENT: Oral Mucosa Clear, moist  Neck: no JVD,  Cardiovascular: S1 and S2 Present, no Murmur,  Respiratory: good respiratory effort, Bilateral Air entry equal and Decreased, no Crackles, no wheezes Abdomen: Bowel Sound present, Soft and no tenderness,  Skin: no rashes Extremities: 3-4+ edema bilateral lower extremities, bilateral erythema, mild tenderness.  No calf tenderness Neurologic: without any new focal findings Gait not checked due to patient safety concerns  Vitals:   05/04/24 1756 05/04/24 1955 05/05/24 0347 05/05/24 0750  BP: (!) 95/50 111/63 (!) 92/55 102/66  Pulse: 81 84 67 79  Resp: 16 17 17 17   Temp:  98.2 F (36.8 C) 98.2 F (36.8 C) 98.2 F (36.8 C)  TempSrc:  Oral Oral   SpO2: 93% 94% 94% 98%  Weight:      Height:        Intake/Output Summary (Last 24 hours) at 05/05/2024 1518 Last data filed at 05/05/2024 1422 Gross per 24 hour  Intake 1347.41 ml  Output --  Net 1347.41 ml   Filed Weights   05/04/24 0820  Weight: 78.1 kg    Data Reviewed: I have personally reviewed and interpreted daily labs, tele strips, imagings as discussed above. I reviewed all nursing notes, pharmacy notes, vitals, pertinent old records I have discussed plan of care as described above with RN and patient/family.  CBC: Recent Labs  Lab 05/03/24 1600 05/04/24 0433 05/05/24 0600  WBC 7.1 6.2 8.3  HGB 9.7* 8.3* 10.1*  HCT 30.8* 26.3* 32.2*  MCV 101.7* 101.5* 100.9*  PLT 275 218 262   Basic Metabolic Panel: Recent Labs  Lab 05/03/24 1600 05/04/24 0432 05/04/24 0433 05/05/24 0600  NA 137  --   141 141  K 3.4*  --  3.4* 3.4*  CL 99  --  105 106  CO2 28  --  30 29  GLUCOSE 130*  --  80 124*  BUN 7  --  6 7  CREATININE 0.67  --  0.49 0.40*  CALCIUM  7.7*  --  7.2* 7.6*  MG  --  1.8  --  1.9  PHOS  --  2.6  --  2.8    Studies: No results found.   Scheduled Meds:  acetaminophen   650 mg Oral TID   apixaban   5 mg Oral BID   carbamazepine   100 mg Oral BID   cholecalciferol   5,000 Units Oral q morning   DULoxetine   60 mg Oral Daily   feeding supplement  237 mL Oral TID BM   folic acid   1 mg Oral Daily   gabapentin   300 mg Oral QID   lamoTRIgine   50 mg Oral BID   levETIRAcetam   1,500 mg Oral BID   melatonin  10 mg  Oral QHS   midodrine   10 mg Oral TID WC   mirtazapine   7.5 mg Oral QHS   mupirocin  ointment  1 Application Nasal BID   pantoprazole   40 mg Oral Daily   potassium chloride  SA  20 mEq Oral Daily   Continuous Infusions:  cefTRIAXone  (ROCEPHIN )  IV 1 g (05/05/24 1430)   furosemide  (LASIX ) 200 mg in dextrose  5 % 100 mL (2 mg/mL) infusion     vancomycin  1,500 mg (05/04/24 1952)   PRN Meds: albuterol , bisacodyl , HYDROmorphone  (DILAUDID ) injection, magnesium  hydroxide, morphine , ondansetron  **OR** ondansetron  (ZOFRAN ) IV, traZODone   Time spent: 55 minutes  Author: ELVAN SOR. MD [facility] 05/05/2024 3:18 PM  To reach On-call, see care teams to locate the attending and reach out to them via [email or web address]. If 7PM-7AM, please contact night-coverage If you still have difficulty reaching the attending provider, please page the [facility] (Director on Call) for [facility] on amion for assistance.

## 2024-05-05 NOTE — Plan of Care (Signed)
   Problem: Coping: Goal: Level of anxiety will decrease Outcome: Progressing

## 2024-05-05 NOTE — Evaluation (Signed)
 Occupational Therapy Evaluation Patient Details Name: Abigail [name] [facility] MRN: [id number] DOB: 04/26/1966 Today's Date: 05/05/2024   History of Present Illness   Pt is a 58 year old female presented to the emergency room with acute onset of bilateral lower extremity swelling with pain and with associated weakness, admitted with Cellulitis of lower extremity, Hypokalemia,    Pmh significant for MH of PE on DOAC, Anxiety, osteoarthritis, fibromyalgia, dyslipidemia, IBS, migraine and seizure,     Clinical Impressions Chart reviewed to date, pt greeted in chair, oriented to self and place, poor situational awareness of this date. PTA pt reports she amb only household distances with rollator/RW at this time (has had a decline recently) and is participating with HHPT. She reports she performs UB ADLs, grooming, feeding, toileting with MOD I, requires assist for LB dressing and IADLs. Pt presents with deficits in strength, endurance, activity tolerance, balance, cognition, affecting safe and optimal ADL completion. Pt performs STS with CGA, she amb in room with RW approx 20' with RW. After returning to chair, she reports she has R sided chest pain with mobility described as Sharp/shooting. Spo2 71% on RA after amb with SOB, up to87% on RA after approx 1 minute of rest, up to 92% on 2 L resting in chair, no SOB or chest pain. Nurse notified and in room to assess. Secure chat sent to provider. Pt is left in bedside chair, all needs met. OT will continue to follow acutely to facilitate optimal ADL/functional mobility performance.      If plan is discharge home, recommend the following:   A little help with walking and/or transfers;A little help with bathing/dressing/bathroom;Direct supervision/assist for medications management;Direct supervision/assist for financial management     Functional Status Assessment   Patient has had a recent decline in their functional status and demonstrates the  ability to make significant improvements in function in a reasonable and predictable amount of time.     Equipment Recommendations   BSC/3in1     Recommendations for Other Services         Precautions/Restrictions   Precautions Precautions: Fall Recall of Precautions/Restrictions: Intact Restrictions Weight Bearing Restrictions Per Provider Order: No     Mobility Bed Mobility               General bed mobility comments: NT in recliner pre/post session    Transfers Overall transfer level: Needs assistance Equipment used: Rolling walker (2 wheels) Transfers: Sit to/from Stand Sit to Stand: Contact guard assist           General transfer comment: from chair      Balance Overall balance assessment: Needs assistance Sitting-balance support: Feet supported Sitting balance-Leahy Scale: Good     Standing balance support: Bilateral upper extremity supported, During functional activity, Reliant on assistive device for balance Standing balance-Leahy Scale: Fair                             ADL either performed or assessed with clinical judgement   ADL Overall ADL's : Needs assistance/impaired     Grooming: Set up;Sitting               Lower Body Dressing: Sitting/lateral leans;Maximal assistance Lower Body Dressing Details (indicate cue type and reason): socks- requires assist at baseline Toilet Transfer: Contact guard assist;Rolling walker (2 wheels)           Functional mobility during ADLs: Contact guard assist;Rolling walker (2 wheels) (approx  20' in room, pt hesitent to weight bear through L heel)       Vision         Perception         Praxis         Pertinent Vitals/Pain Pain Assessment Pain Assessment: 0-10 Pain Score: 7  Pain Location: R sided chest pain Pain Descriptors / Indicators: [place], Shooting Pain Intervention(s): Monitored during session, Repositioned (notified nurse in person, provider via secure  chat)     Extremity/Trunk Assessment Upper Extremity Assessment Upper Extremity Assessment: Generalized weakness   Lower Extremity Assessment Lower Extremity Assessment: Generalized weakness;LLE deficits/detail LLE Deficits / Details: pt reports L heel pain; no redness/breakdown noted in this location       Communication Communication Communication: No apparent difficulties   Cognition Arousal: Alert Behavior During Therapy: Flat affect, WFL for tasks assessed/performed Cognition: Cognition impaired, No family/caregiver present to determine baseline   Orientation impairments: Situation Awareness: Intellectual awareness impaired, Online awareness impaired Memory impairment (select all impairments): Declarative long-term memory Attention impairment (select first level of impairment): Sustained attention Executive functioning impairment (select all impairments): Problem solving, Reasoning OT - Cognition Comments: poor situational awareness                 Following commands: Impaired Following commands impaired: Follows one step commands with increased time     Cueing  General Comments   Cueing Techniques: Verbal cues      Exercises Other Exercises Other Exercises: edu re role of OT, role of rehab, discharge recommendations   Shoulder Instructions      Home Living Family/patient expects to be discharged to:: Private residence Living Arrangements: Parent;Other relatives Available Help at Discharge: Family;Available 24 hours/day Type of Home: House Home Access: Stairs to enter [employer] of Steps: 2 Entrance Stairs-Rails: Right Home Layout: One level     Bathroom Shower/Tub: Chief Strategy Officer:  (raised toilet seat with bars)     Home Equipment: Agricultural consultant (2 wheels);Rollator (4 wheels);BSC/3in1          Prior Functioning/Environment Prior Level of Function : Needs assist             Mobility Comments: amb with  RW or rollator household distances- not strong enough recently for community mobility per her report; 5 falls in the last 6 months per her report ADLs Comments: MOD I for UB dressing, feeding, grooming, toileting; mom assists with LB dressing (cannot feel her feet), assist with IADLs as needed    OT Problem List: Decreased strength;Impaired balance (sitting and/or standing);Decreased cognition;Decreased safety awareness;Decreased activity tolerance;Decreased knowledge of use of DME or AE;Cardiopulmonary status limiting activity   OT Treatment/Interventions: Self-care/ADL training;Therapeutic exercise;Patient/family education;Visual/perceptual remediation/compensation;Balance training;Energy conservation;Therapeutic activities;DME and/or AE instruction      OT Goals(Current goals can be found in the care plan section)   Acute Rehab OT Goals Patient Stated Goal: go home OT Goal Formulation: With patient Time For Goal Achievement: 05/19/24 Potential to Achieve Goals: Fair ADL Goals Pt Will Perform Grooming: with modified independence;sitting;standing Pt Will Perform Lower Body Dressing: with modified independence;sitting/lateral leans;sit to/from stand Pt Will Transfer to Toilet: with modified independence;ambulating Pt Will Perform Toileting - Clothing Manipulation and hygiene: with modified independence;sit to/from stand   OT Frequency:  Min 2X/week    Co-evaluation              AM-PAC OT 6 Clicks Daily Activity     Outcome Measure Help from another person eating meals?: None  Help from another person taking care of personal grooming?: None Help from another person toileting, which includes using toliet, bedpan, or urinal?: A Little Help from another person bathing (including washing, rinsing, drying)?: A Little Help from another person to put on and taking off regular upper body clothing?: None Help from another person to put on and taking off regular lower body clothing?: A  Lot 6 Click Score: 20   End of Session Equipment Utilized During Treatment: Rolling walker (2 wheels) Nurse Communication: Mobility status  Activity Tolerance: Patient limited by pain Patient left: in chair;with call bell/phone within reach;with chair alarm set  OT Visit Diagnosis: Other abnormalities of gait and mobility (R26.89);Muscle weakness (generalized) (M62.81)                Time: 8546-8474 OT Time Calculation (min): 32 min Charges:  OT General Charges $OT Visit: 1 Visit OT Evaluation $OT Eval Moderate Complexity: 1 Mod Therisa Sheffield, OTD OTR/L  05/05/24, 3:51 PM

## 2024-05-06 ENCOUNTER — Other Ambulatory Visit: Payer: Self-pay

## 2024-05-06 ENCOUNTER — Inpatient Hospital Stay

## 2024-05-06 ENCOUNTER — Telehealth: Payer: Self-pay

## 2024-05-06 DIAGNOSIS — M7732 Calcaneal spur, left foot: Secondary | ICD-10-CM | POA: Diagnosis not present

## 2024-05-06 DIAGNOSIS — S92512D Displaced fracture of proximal phalanx of left lesser toe(s), subsequent encounter for fracture with routine healing: Secondary | ICD-10-CM | POA: Diagnosis not present

## 2024-05-06 DIAGNOSIS — L03119 Cellulitis of unspecified part of limb: Secondary | ICD-10-CM | POA: Diagnosis not present

## 2024-05-06 LAB — CBC
HCT: 28.7 % — ABNORMAL LOW (ref 36.0–46.0)
Hemoglobin: 8.9 g/dL — ABNORMAL LOW (ref 12.0–15.0)
MCH: 31.7 pg (ref 26.0–34.0)
MCHC: 31 g/dL (ref 30.0–36.0)
MCV: 102.1 fL — ABNORMAL HIGH (ref 80.0–100.0)
Platelets: 253 K/uL (ref 150–400)
RBC: 2.81 MIL/uL — ABNORMAL LOW (ref 3.87–5.11)
RDW: 14.1 % (ref 11.5–15.5)
WBC: 8.7 K/uL (ref 4.0–10.5)
nRBC: 0 % (ref 0.0–0.2)

## 2024-05-06 LAB — BASIC METABOLIC PANEL WITH GFR
Anion gap: 10 (ref 5–15)
BUN: 6 mg/dL (ref 6–20)
CO2: 32 mmol/L (ref 22–32)
Calcium: 7.7 mg/dL — ABNORMAL LOW (ref 8.9–10.3)
Chloride: 103 mmol/L (ref 98–111)
Creatinine, Ser: 0.34 mg/dL — ABNORMAL LOW (ref 0.44–1.00)
GFR, Estimated: >60 mL/min (ref >60)
Glucose, Bld: 91 mg/dL (ref 70–99)
Potassium: 3.3 mmol/L — ABNORMAL LOW (ref 3.5–5.1)
Sodium: 145 mmol/L (ref 135–145)

## 2024-05-06 LAB — MAGNESIUM: Magnesium: 1.8 mg/dL (ref 1.7–2.4)

## 2024-05-06 LAB — PHOSPHORUS: Phosphorus: 3.1 mg/dL (ref 2.5–4.6)

## 2024-05-06 MED ORDER — POTASSIUM CHLORIDE CRYS ER 20 MEQ PO TBCR
40.0000 meq | EXTENDED_RELEASE_TABLET | Freq: Once | ORAL | Status: AC
Start: 2024-05-06 — End: 2024-05-06
  Administered 2024-05-06: 40 meq via ORAL
  Filled 2024-05-06: qty 2

## 2024-05-06 MED ORDER — DOXYCYCLINE HYCLATE 100 MG PO TABS
100.0000 mg | ORAL_TABLET | Freq: Two times a day (BID) | ORAL | 0 refills | Status: AC
  Filled 2024-05-06: qty 10, 5d supply, fill #0

## 2024-05-06 MED ORDER — FOLIC ACID 1 MG PO TABS
1.0000 mg | ORAL_TABLET | Freq: Every day | ORAL | 2 refills | Status: DC
  Filled 2024-05-06: qty 30, 30d supply, fill #0
  Filled 2024-06-08: qty 30, 30d supply, fill #1

## 2024-05-06 MED ORDER — TORSEMIDE 20 MG PO TABS
20.0000 mg | ORAL_TABLET | Freq: Every day | ORAL | Status: DC
  Administered 2024-05-06: 20 mg via ORAL
  Filled 2024-05-06: qty 1

## 2024-05-06 MED ORDER — TORSEMIDE 20 MG PO TABS
20.0000 mg | ORAL_TABLET | Freq: Every day | ORAL | 0 refills | Status: DC
  Filled 2024-05-06: qty 30, 30d supply, fill #0

## 2024-05-06 MED ORDER — CEFDINIR 300 MG PO CAPS
300.0000 mg | ORAL_CAPSULE | Freq: Two times a day (BID) | ORAL | 0 refills | Status: AC
  Filled 2024-05-06: qty 10, 5d supply, fill #0

## 2024-05-06 MED ORDER — DOXYCYCLINE HYCLATE 100 MG PO TABS
100.0000 mg | ORAL_TABLET | Freq: Two times a day (BID) | ORAL | Status: DC
Start: 2024-05-06 — End: 2024-05-06
  Administered 2024-05-06: 100 mg via ORAL
  Filled 2024-05-06: qty 1

## 2024-05-06 NOTE — Telephone Encounter (Signed)
 Spoke with Jon at [facility] and confirmed the only ordered needed is [phone]. Given to PCP for signature.

## 2024-05-06 NOTE — TOC CM/SW Note (Signed)
 Patient is not able to walk the distance required to go the bathroom, or he/she is unable to safely negotiate stairs required to access the bathroom.  A 3in1 BSC will alleviate this problem

## 2024-05-06 NOTE — Care Management Important Message (Signed)
 Important Message  Patient Details  Name: Abigail [name] [facility] MRN: [id number] Date of Birth: Nov 08, 1965   Important Message Given:  Yes - Medicare IM     Deniese Oberry W, CMA 05/06/2024, 11:44 AM

## 2024-05-06 NOTE — Plan of Care (Signed)

## 2024-05-06 NOTE — Discharge Summary (Signed)
 [facility] Discharge Summary   Patient: [name] FMW:[id number]  PCP: [name], Susan K, MD  Date of admission: 05/03/2024   Date of discharge:  05/06/2024     Discharge Diagnoses:  Principal Problem:   Cellulitis of lower extremity Active Problems:   History of pulmonary embolus (PE)   Seizure disorder (HCC)   GERD without esophagitis   Chronic obstructive pulmonary disease (COPD) (HCC)   Admitted From: Home Disposition:  Home with [facility]  Recommendations for Outpatient Follow-up:  Follow-up with PCP in 1 week Follow up LABS/TEST:  BMP in 1 wk   Follow-up Information     [name], Susan K, MD Follow up in 1 week(s).   Specialty: Family Medicine Contact information: [address] [place] [address] [phone]                Diet recommendation: Regular diet  Activity: The patient is advised to gradually reintroduce usual activities, as tolerated  Discharge Condition: stable  Code Status: Full code   History of present illness: As per the H and P dictated on admission.  Hospital Course:  Elynore [facility] K[place] is a 58 y.o. female with MH of PE on DOAC, Anxiety, osteoarthritis, fibromyalgia, dyslipidemia, IBS, migraine and seizure, back who presented to the emergency room with acute onset of bilateral lower extremity swelling with pain and with associated weakness.  She had mild erythema and warmth.  This has been developing and worsening over the last 6 weeks.  She was given Lasix  by her PCP.  She denies any falls or trauma.  She is complaining of left heel pain.  She admitted to an episode of chest pain without nausea or vomiting or diaphoresis.  No dyspnea or cough or wheezing.  No fever or chills.  No dysuria, hematuria, urinary frequency or urgency.   ED Course: Upon presentation to the emergency room, vital signs were within normal.  Labs revealed potassium of 3.7 and glucose 130 calcium  7.7 and alk phos 174, albumin 1.9 and total  protein 5.5.  BNP was 42.6.  High-sensitivity troponin I was 3.  Lactic acid was 1.2.  CBC showed hemoglobin of 9.7 and hematocrit 30.8 close to previous levels. EKG as reviewed by me :  EKG showed normal sinus rhythm with a rate of 90 with low voltage QRS and Q waves anteroseptally. Imaging: 2 view chest x-ray showed hypoventilatory changes with streaky atelectasis and or scarring at the bases.     The patient was given 500 mL IV normal saline bolus followed 125 mL/h and IV vancomycin .  She will be admitted to a medical-surgical bed for further evaluation and management.     Assessment and Plan:     # Cellulitis of lower extremity 9/14 Vancomycin  IV  and 9/15 started ceftriaxone  1 g IV daily MRSA screen positive. Pharmacy was consulted for dosing and trough monitor. Venous duplex negative for DVT S/p Lasix  40 mg po followed by Lasix  IV infusion.   9/17 cellulitis improved, transition to oral antibiotics doxycycline  twice daily and Omnicef  twice daily for 5 days.  Recommended to follow-up with PCP as an outpatient. Significant edema of lower extremities, improved after IV Lasix  infusion given during hospital stay and ace wraps applied.  Patient was discharged on torsemide  20 mg p.o. daily and potassium chloride  20 mEq p.o. daily.  Repeat BMP after 1 week and PCP.  # Hypokalemia, mild, potassium repleted.  Repeat BMP after 1 week, follow-up with PCP.   # Chronic hypotension:  Patient is on midodrine  as needed at home 9/15 BP soft, s/p midodrine  10 mg p.o. TID given during hospital stay due to low blood pressure.  BP improved, resumed home dose midodrine .  Recommended to follow-up with PCP and monitor BP at home.   # Chronic obstructive pulmonary disease (COPD): Stable, continued albuterol  as needed.   # GERD without esophagitis: continue PPI therapy.   # Seizure disorder: continue Keppra , Tegretol  XR, Neurontin  and Lamictal .   # History of pulmonary embolus (PE): continued Eliquis    #  Fibromyalgia, rheumatoid arthritis, chronic pain on morphine  IR 15 mg p.o. 3 times daily as needed at home which has been resumed Tylenol  650 mg p.o. 3 times daily scheduled and Dilaudid  every 12 hourly as needed for breakthrough pain   # Depression and anxiety: Continue Cymbalta  and Remeron    # Folic acid  deficiency, folic acid  level 2.2.  Started folic acid  1 mg p.o. daily.  Follow with PCP to repeat folic acid  level after 3 to 6 months.   Body mass index is 28.65 kg/m.  Nutrition Interventions:  - Patient was instructed, not to drive, operate heavy machinery, perform activities at heights, swimming or participation in water activities or provide baby sitting services while on Pain, Sleep and Anxiety Medications; until her outpatient Physician has advised to do so again.  - Also recommended to not to take more than prescribed Pain, Sleep and Anxiety Medications.  Patient was seen by physical therapy, who recommended Home health, which was arranged. On the day of the discharge the patient's vitals were stable, and no other acute medical condition were reported by patient. the patient was felt safe to be discharge at Home with Home health.  Consultants: None Procedures: None  Discharge Exam: General: Appear in no distress, Oral Mucosa Clear, moist. Cardiovascular: S1 and S2 Present, no Murmur, Respiratory: normal respiratory effort, Bilateral Air entry present and no Crackles, no wheezes Abdomen: Bowel Sound present, Soft and no tenderness. Extremities: mild Pedal edema, no calf tenderness Neurology: alert and oriented to time, place, and person affect appropriate.  Filed Weights   05/04/24 0820  Weight: 78.1 kg   Vitals:   05/06/24 0800 05/06/24 1037  BP: (!) 116/55 (!) 99/55  Pulse: 74 84  Resp: 18   Temp: 98.4 F (36.9 C)   SpO2: 100% 92%    DISCHARGE MEDICATION: Allergies as of 05/06/2024       Reactions   Nsaids Hives   Tapentadol Swelling, Rash, Other (See  Comments)   Nucynta- Made her deathly sick   Cephalexin  Other (See Comments)   Reaction not cited   Codeine Other (See Comments)   Reaction not cited   Darvocet [propoxyphene N-acetaminophen ] Other (See Comments)   Reaction not cited   Latex Other (See Comments)   Reaction not cited   Silicone Other (See Comments)   Reaction not cited   Sulfa Antibiotics Hives   Tape Other (See Comments)   Reaction not cited   Meloxicam Rash        Medication List     STOP taking these medications    furosemide  40 MG tablet Commonly known as: LASIX        TAKE these medications    acetaminophen  500 MG tablet Commonly known as: TYLENOL  Take 1,000 mg by mouth every 6 (six) hours as needed for moderate pain (pain score 4-6).   albuterol  108 (90 Base) MCG/ACT inhaler Commonly known as: VENTOLIN  HFA Inhale 2 puffs into the lungs. Take two (2)  puffs by mouth every 4 to 6 hours as needed for wheezing or shortness of breath   AMBULATORY NON FORMULARY MEDICATION Knee-high, medium compression, graduated compression stockings. Apply to lower extremities. [email or web address], Zippered Compression Stockings, medium circ, long length   apixaban  5 MG Tabs tablet Commonly known as: Eliquis  Take 1 tablet (5 mg total) by mouth 2 (two) times daily.   bisacodyl  5 MG EC tablet Generic drug: bisacodyl  Take 1 tablet (5 mg total) by mouth daily as needed for moderate constipation.   carbamazepine  100 MG 12 hr tablet Commonly known as: TEGRETOL  XR Take 1 tablet (100 mg total) by mouth 2 (two) times daily.   cefdinir  300 MG capsule Commonly known as: OMNICEF  Take 1 capsule (300 mg total) by mouth 2 (two) times daily for 5 days.   doxycycline  100 MG tablet Commonly known as: VIBRA -TABS Take 1 tablet (100 mg total) by mouth 2 (two) times daily for 5 days.   DULoxetine  60 MG capsule Commonly known as: Cymbalta  Take 1 capsule (60 mg total) by mouth daily.   feeding supplement Liqd Take 237  mLs by mouth 3 (three) times daily between meals.   folic acid  1 MG tablet Commonly known as: FOLVITE  Take 1 tablet (1 mg total) by mouth daily. Start taking on: May 07, 2024   gabapentin  300 MG capsule Commonly known as: NEURONTIN  Take 1 capsule (300 mg total) by mouth 4 (four) times daily.   lamoTRIgine  25 MG tablet Commonly known as: LAMICTAL  Take 2 tablets (50 mg total) by mouth 2 (two) times daily.   levETIRAcetam  750 MG tablet Commonly known as: KEPPRA  Take 2 tablets (1,500 mg total) by mouth 2 (two) times daily.   Melatonin 10 MG Tabs Take 10 mg by mouth at bedtime.   midodrine  5 MG tablet Commonly known as: PROAMATINE  Take 1 tablet (5 mg total) by mouth 3 (three) times daily as needed (Systolic BP <100 mmHg).   mirtazapine  7.5 MG tablet Commonly known as: REMERON  TAKE 1 TABLET BY MOUTH AT BEDTIME. What changed: Another medication with the same name was removed. Continue taking this medication, and follow the directions you see here.   morphine  15 MG tablet Commonly known as: MSIR Take 1 tablet (15 mg total) by mouth every 8 (eight) hours as needed for severe pain (pain score 7-10).   NON FORMULARY Apply 1 Application topically 3 (three) times daily as needed. Rock Salt Cream (Similar to [employer])   ondansetron  4 MG tablet Commonly known as: ZOFRAN  Take 1 tablet (4 mg total) by mouth every 8 (eight) hours as needed for nausea or vomiting.   pantoprazole  40 MG tablet Commonly known as: PROTONIX  Take 1 tablet (40 mg total) by mouth daily.   potassium chloride  SA 20 MEQ tablet Commonly known as: KLOR-CON  M TAKE 1 TABLET BY MOUTH EVERY DAY   Santyl  250 UNIT/GM ointment Generic drug: collagenase  Apply 1 Application topically daily.   torsemide  20 MG tablet Commonly known as: DEMADEX  Take 1 tablet (20 mg total) by mouth daily.   VITAMIN D -3 PO Take 1 capsule by mouth every morning.               Durable Medical Equipment  (From admission,  onward)           Start     Ordered   05/06/24 0000  For home use only DME 3 n 1        05/06/24 1303   05/06/24 0000  For home use  only DME Walker rolling       Question Answer Comment  Walker: With 5 Inch Wheels   Patient needs a walker to treat with the following condition Physical debility      05/06/24 1303           Allergies  Allergen Reactions   Nsaids Hives   Tapentadol Swelling, Rash and Other (See Comments)    Nucynta- Made her deathly sick   Cephalexin  Other (See Comments)    Reaction not cited   Codeine Other (See Comments)    Reaction not cited   Darvocet [Propoxyphene N-Acetaminophen ] Other (See Comments)    Reaction not cited   Latex Other (See Comments)    Reaction not cited   Silicone Other (See Comments)    Reaction not cited   Sulfa Antibiotics Hives   Tape Other (See Comments)    Reaction not cited   Meloxicam Rash   Discharge Instructions     Call MD for:  difficulty breathing, headache or visual disturbances   Complete by: As directed    Call MD for:  extreme fatigue   Complete by: As directed    Call MD for:  persistant dizziness or light-headedness   Complete by: As directed    Call MD for:  persistant nausea and vomiting   Complete by: As directed    Call MD for:  redness, tenderness, or signs of infection (pain, swelling, redness, odor or green/yellow discharge around incision site)   Complete by: As directed    Call MD for:  severe uncontrolled pain   Complete by: As directed    Call MD for:  temperature >100.4   Complete by: As directed    Diet - low sodium heart healthy   Complete by: As directed    Discharge instructions   Complete by: As directed    Follow-up with PCP in 1 week   For home use only DME 3 n 1   Complete by: As directed    For home use only DME Walker rolling   Complete by: As directed    Walker: With 5 Inch Wheels   Patient needs a walker to treat with the following condition: Physical debility    Increase activity slowly   Complete by: As directed        The results of significant diagnostics from this hospitalization (including imaging, microbiology, ancillary and laboratory) are listed below for reference.    Significant Diagnostic Studies: US  Venous Img Lower Bilateral Result Date: 05/03/2024 EXAM: ULTRASOUND DUPLEX OF THE BILATERAL LOWER EXTREMITY VEINS TECHNIQUE: Duplex ultrasound using B-mode/gray scaled imaging and Doppler spectral analysis and color flow was obtained of the deep venous structures of the bilateral lower extremity. COMPARISON: 04/03/2024 CLINICAL HISTORY: Swelling, pain. FINDINGS: The visualized veins of the lower extremity are patent and free of echogenic thrombus. The veins demonstrate good compressibility with normal color flow study and spectral analysis. The calf veins are not well visualized. IMPRESSION: 1. No evidence of DVT in the visualized segments. 2. Limited evaluation of the calf veins. Electronically signed by: Lonni Necessary MD 05/03/2024 06:59 PM EDT RP Workstation: [id number]   DG Chest 2 View Result Date: 05/03/2024 CLINICAL DATA:  Shortness of breath lower extremity swelling EXAM: CHEST - 2 VIEW COMPARISON:  04/03/2024, CT chest 12/29/2023 FINDINGS: Hypoventilatory changes. Streaky atelectasis and or scarring at the bases. No consolidation, pleural effusion or pneumothorax. Probable soft tissue artifact lucency at the left CP angle. Chronic kyphosis and compression deformities at  the thoracolumbar region. Hardware at the cervicothoracic spine. IMPRESSION: Hypoventilatory changes with streaky atelectasis and or scarring at the bases. Electronically Signed   By: Luke Bun M.D.   On: 05/03/2024 17:13   CT Head Wo Contrast Result Date: 04/07/2024 CLINICAL DATA:  Head and neck trauma fall EXAM: CT HEAD WITHOUT CONTRAST CT CERVICAL SPINE WITHOUT CONTRAST TECHNIQUE: Multidetector CT imaging of the head and cervical spine was performed following the  standard protocol without intravenous contrast. Multiplanar CT image reconstructions of the cervical spine were also generated. RADIATION DOSE REDUCTION: This exam was performed according to the departmental dose-optimization program which includes automated exposure control, adjustment of the mA and/or kV according to patient size and/or use of iterative reconstruction technique. COMPARISON:  CT brain and cervical spine 03/04/2024 FINDINGS: CT HEAD FINDINGS Brain: No acute territorial infarction, hemorrhage or intracranial mass. The ventricles are nonenlarged Vascular: No hyperdense vessel or unexpected calcification. Skull: Normal. Negative for fracture or focal lesion. Sinuses/Orbits: Opacified left maxillary sinus Other: Small left posterior scalp hematoma CT CERVICAL SPINE FINDINGS Alignment: Straightening of the cervical spine. No subluxation. Facet alignment is normal Skull base and vertebrae: No acute fracture. No primary bone lesion or focal pathologic process. Soft tissues and spinal canal: No prevertebral fluid or swelling. No visible canal hematoma. Disc levels: Anterior fusion hardware C5 through C7. Mild disc space narrowing at C4-C5. Posterior disc osteophyte complex at C3-C4 and C4-C5 with mild canal stenosis. Facet degenerative changes at multiple levels. Moderate bilateral foraminal narrowing C4-C5. Upper chest: Negative. Other: None IMPRESSION: 1. Negative non contrasted CT appearance of the brain. 2. Straightening of the cervical spine with postsurgical changes C5 through C7. No acute osseous abnormality. Electronically Signed   By: Luke Bun M.D.   On: 04/07/2024 00:57   CT Cervical Spine Wo Contrast Result Date: 04/07/2024 CLINICAL DATA:  Head and neck trauma fall EXAM: CT HEAD WITHOUT CONTRAST CT CERVICAL SPINE WITHOUT CONTRAST TECHNIQUE: Multidetector CT imaging of the head and cervical spine was performed following the standard protocol without intravenous contrast. Multiplanar CT  image reconstructions of the cervical spine were also generated. RADIATION DOSE REDUCTION: This exam was performed according to the departmental dose-optimization program which includes automated exposure control, adjustment of the mA and/or kV according to patient size and/or use of iterative reconstruction technique. COMPARISON:  CT brain and cervical spine 03/04/2024 FINDINGS: CT HEAD FINDINGS Brain: No acute territorial infarction, hemorrhage or intracranial mass. The ventricles are nonenlarged Vascular: No hyperdense vessel or unexpected calcification. Skull: Normal. Negative for fracture or focal lesion. Sinuses/Orbits: Opacified left maxillary sinus Other: Small left posterior scalp hematoma CT CERVICAL SPINE FINDINGS Alignment: Straightening of the cervical spine. No subluxation. Facet alignment is normal Skull base and vertebrae: No acute fracture. No primary bone lesion or focal pathologic process. Soft tissues and spinal canal: No prevertebral fluid or swelling. No visible canal hematoma. Disc levels: Anterior fusion hardware C5 through C7. Mild disc space narrowing at C4-C5. Posterior disc osteophyte complex at C3-C4 and C4-C5 with mild canal stenosis. Facet degenerative changes at multiple levels. Moderate bilateral foraminal narrowing C4-C5. Upper chest: Negative. Other: None IMPRESSION: 1. Negative non contrasted CT appearance of the brain. 2. Straightening of the cervical spine with postsurgical changes C5 through C7. No acute osseous abnormality. Electronically Signed   By: Luke Bun M.D.   On: 04/07/2024 00:57    Microbiology: Recent Results (from the past 240 hours)  MRSA Next Gen by PCR, Nasal     Status: Abnormal  Collection Time: 05/04/24  8:30 AM   Specimen: Nasal Mucosa; Nasal Swab  Result Value Ref Range Status   MRSA by PCR Next Gen DETECTED (A) NOT DETECTED Final    Comment: RESULT CALLED TO, READ BACK BY AND VERIFIED WITH: brittney mansfield rn 05/04/24 1005 kg (NOTE) The  GeneXpert MRSA Assay (FDA approved for NASAL specimens only), is one component of a comprehensive MRSA colonization surveillance program. It is not intended to diagnose MRSA infection nor to guide or monitor treatment for MRSA infections. Test performance is not FDA approved in patients less than 38 years old. Performed at [facility], [address] [place], [place] [address]      Labs: CBC: Recent Labs  Lab 05/03/24 1600 05/04/24 0433 05/05/24 0600 05/06/24 0510  WBC 7.1 6.2 8.3 8.7  HGB 9.7* 8.3* 10.1* 8.9*  HCT 30.8* 26.3* 32.2* 28.7*  MCV 101.7* 101.5* 100.9* 102.1*  PLT 275 218 262 253   Basic Metabolic Panel: Recent Labs  Lab 05/03/24 1600 05/04/24 0432 05/04/24 0433 05/05/24 0600 05/06/24 0510  NA 137  --  141 141 145  K 3.4*  --  3.4* 3.4* 3.3*  CL 99  --  105 106 103  CO2 28  --  30 29 32  GLUCOSE 130*  --  80 124* 91  BUN 7  --  6 7 6   CREATININE 0.67  --  0.49 0.40* 0.34*  CALCIUM  7.7*  --  7.2* 7.6* 7.7*  MG  --  1.8  --  1.9 1.8  PHOS  --  2.6  --  2.8 3.1   Liver Function Tests: Recent Labs  Lab 05/03/24 1600  AST 25  ALT 9  ALKPHOS 174*  BILITOT 0.3  PROT 5.5*  ALBUMIN 1.9*   No results for input(s): LIPASE, AMYLASE in the last 168 hours. No results for input(s): AMMONIA in the last 168 hours. Cardiac Enzymes: No results for input(s): CKTOTAL, CKMB, CKMBINDEX, TROPONINI in the last 168 hours. BNP (last 3 results) Recent Labs    04/03/24 1805 04/24/24 1424 05/03/24 1608  BNP 911.0* 38.7 42.6   CBG: No results for input(s): GLUCAP in the last 168 hours.  Time spent: 35 minutes  Signed:  Elvan Sor  [facility] 05/06/2024 1:03 PM

## 2024-05-06 NOTE — Telephone Encounter (Signed)
 Copied from CRM [id number]. Topic: Clinical - Home Health Verbal Orders >> May 06, 2024  3:20 PM Rosaria BRAVO wrote: Jon from [facility] called needing information regarding a home health order from almost two months ago. Please advise   Best contact: [phone]   Order number:  [id number]  [id number]

## 2024-05-06 NOTE — TOC CM/SW Note (Signed)
 Patient has a mobility limitation that impairs their daily activities, they will need a walker and can safely use it.

## 2024-05-06 NOTE — Evaluation (Signed)
 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 Prior to hospitalization, pt was mod I with ambulating using RW or rollator. She currently lives at her mother's home, but would like to be discharged to her home, if possible. She is very adamant about not wanting to go to STR.   Pt performs transfers with supervision and ambulation with CGA. Pt c/c L heel pain and self-selected NWB while ambulating, however was able to tolerate 52ft using a RW. Pt had noted inc in RR after ambulating with 2L of O2; SpO2 at 89% at the lowest and was able to recover with rest. Pt demonstrates STS with supervision from SPT for safety and used intermittent SUE to help with donning hospital brief. Pt demonstrates deficits with strength/activity tolerance/balance.         If plan is discharge home, recommend the following: A little help with walking and/or transfers;A little help with bathing/dressing/bathroom;Assistance with cooking/housework   Can travel by private vehicle        Equipment Recommendations None recommended by PT  Recommendations for Other Services       Functional Status Assessment Patient has had a recent decline in their functional status and demonstrates the ability to make significant improvements in function in a reasonable and predictable amount of time.     Precautions / Restrictions Precautions Precautions: Fall Recall of  Precautions/Restrictions: Intact Restrictions Weight Bearing Restrictions Per Provider Order: No (Self-selected NWB on L heel)      Mobility  Bed Mobility               General bed mobility comments: NT in recliner pre/post session    Transfers Overall transfer level: Needs assistance Equipment used: Rolling walker (2 wheels) Transfers: Sit to/from Stand Sit to Stand: Supervision           General transfer comment: Pt able to perform STS from chair using BUE to push up.    Ambulation/Gait Ambulation/Gait assistance: Contact guard assist Gait Distance (Feet): 20 Feet Assistive device: Rolling walker (2 wheels) Gait Pattern/deviations: WFL(Within Functional Limits) Gait velocity: dec     General Gait Details: No LOB with ambulation. Noted inc in RR. Pt able to recover once given seated break  Stairs            Wheelchair Mobility     Tilt Bed    Modified Rankin (Stroke Patients Only)       Balance Overall balance assessment: Needs assistance Sitting-balance support: Feet supported Sitting balance-Leahy Scale: Good Sitting balance - Comments: able to maintain seated balance in recliner   Standing balance support: Bilateral upper extremity supported, During functional activity, Reliant on assistive device for balance Standing balance-Leahy Scale: Fair Standing balance comment: able to maintain standing balance using RW. Pt initermittently used SUE to help don hospital brief                             Pertinent Vitals/Pain Pain Assessment  Pain Assessment: Faces Faces Pain Scale: Hurts a little bit Pain Location: left heel Pain Descriptors / Indicators: Grimacing Pain Intervention(s): Limited activity within patient's tolerance    Home Living Family/patient expects to be discharged to:: Private residence Living Arrangements: Parent;Other relatives Available Help at Discharge: Family;Available 24 hours/day (godson) Type of Home:  House Home Access: Ramped entrance (per OT note, pt was speaking of mother's house however if discharged home she would like to be at her home which has a ramp)       Home Layout: One level Home Equipment: Agricultural consultant (2 wheels);Rollator (4 wheels);BSC/3in1 Additional Comments: Pt currently lives with her mother, but if discharged home, she would like to be discharged to her home. Her godson lives at her home and will be able to physically assist her if needed.    Prior Function Prior Level of Function : Needs assist       Physical Assist : ADLs (physical)     Mobility Comments: amb with RW or rollator household distances- not strong enough recently for community mobility per her report; 5 falls in the last 6 months per her report ADLs Comments: MOD I for UB dressing, feeding, grooming, toileting; mom assists with LB dressing (cannot feel her feet), assist with IADLs as needed     Extremity/Trunk Assessment   Upper Extremity Assessment Upper Extremity Assessment: Generalized weakness    Lower Extremity Assessment Lower Extremity Assessment: Generalized weakness;LLE deficits/detail LLE Deficits / Details: Pt reports L heel pain. Self-selected NWB with ambulation. Slight discoloration at medial heel, but not red. No skin breakdown noted    Cervical / Trunk Assessment Cervical / Trunk Assessment: Normal  Communication   Communication Communication: No apparent difficulties    Cognition Arousal: Alert Behavior During Therapy: WFL for tasks assessed/performed   PT - Cognitive impairments: No apparent impairments                       PT - Cognition Comments: pleasant and agreeable to PT session Following commands: Impaired Following commands impaired: Follows one step commands with increased time     Cueing Cueing Techniques: Verbal cues     General Comments General comments (skin integrity, edema, etc.): Pt able to tolerate no supplemental O2 at rest  however requires 2L of O2 with ambulation to stay >90%. Pt only uses supplemental O2 at home PRN.    Exercises     Assessment/Plan    PT Assessment Patient needs continued PT services  PT Problem List Decreased strength;Decreased activity tolerance;Decreased balance;Decreased mobility;Pain       PT Treatment Interventions Gait training;Functional mobility training;Therapeutic activities;Therapeutic exercise;Balance training;Neuromuscular re-education;Patient/family education    PT Goals (Current goals can be found in the Care Plan section)  Acute Rehab PT Goals Patient Stated Goal: to go home and not have to go to rehab PT Goal Formulation: With patient Time For Goal Achievement: 05/20/24 Potential to Achieve Goals: Good    Frequency Min 2X/week     Co-evaluation               AM-PAC PT 6 Clicks Mobility  Outcome Measure Help needed turning from your back to your side while in a flat bed without using bedrails?: None Help needed moving from lying on your back to sitting on the side of a flat bed without using bedrails?: A Little Help needed moving to and from a bed to a chair (including a wheelchair)?: A Little Help needed standing up from a  chair using your arms (e.g., wheelchair or bedside chair)?: None Help needed to walk in hospital room?: A Little Help needed climbing 3-5 steps with a railing? : A Little 6 Click Score: 20    End of Session Equipment Utilized During Treatment: Gait belt Activity Tolerance: Patient tolerated treatment well Patient left: in chair;with chair alarm set;with call bell/phone within reach Nurse Communication: Mobility status PT Visit Diagnosis: Unsteadiness on feet (R26.81);Muscle weakness (generalized) (M62.81);History of falling (Z91.81)    Time: 9042-8979 PT Time Calculation (min) (ACUTE ONLY): 23 min   Charges:                 Cylus Douville, SPT   Merrillyn Ackerley 05/06/2024, 11:31 AM

## 2024-05-07 ENCOUNTER — Other Ambulatory Visit: Payer: Self-pay | Admitting: Family Medicine

## 2024-05-07 ENCOUNTER — Telehealth: Payer: Self-pay | Admitting: *Deleted

## 2024-05-07 DIAGNOSIS — R64 Cachexia: Secondary | ICD-10-CM

## 2024-05-07 NOTE — Transitions of Care (Post Inpatient/ED Visit) (Signed)
   05/07/2024  Name: Abigail [name] [facility] MRN: [id number] DOB: 08-27-1965  Today's TOC FU Call Status: Today's TOC FU Call Status:: Unsuccessful Call (1st Attempt) Unsuccessful Call (1st Attempt) Date: 05/07/24  Attempted to reach the patient regarding the most recent Inpatient visit.  Received automated outgoing voice message stating that the person you are trying to call has a voice mail box that is full; please hang up and try your call again later; unable to leave voice message requesting call back   Follow Up Plan: Additional outreach attempts will be made to reach the patient to complete the Transitions of Care (Post Inpatient/ED visit) call.   Pls call/ message for questions,  Evelynne Spiers Mckinney Jonah Gingras, RN, BSN, CCRN Alumnus RN Care Manager  Transitions of Care  [facility] - [facility] [phone]: direct office

## 2024-05-08 ENCOUNTER — Telehealth: Payer: Self-pay | Admitting: *Deleted

## 2024-05-08 NOTE — Transitions of Care (Post Inpatient/ED Visit) (Signed)
   05/08/2024  Name: [name] [facility] MRN: [id number] DOB: 15-Nov-1965  Today's TOC FU Call Status: Today's TOC FU Call Status:: Unsuccessful Call (2nd Attempt) Unsuccessful Call (2nd Attempt) Date: 05/08/24 (spoke withpatient's mother/ caregiver: she requests call back on Monday)  Attempted to reach the patient regarding the most recent Inpatient visit.  Patient's mother Abigail [name] had left voice message after initial attempt yesterday, requesting I contact her rather than the patient- verified [name] on most recent DPR Patient's mother answered this morning and stated she is unable to talk to today as she is on her way to a personal doctor appointment and is running late: she requests callback on Monday of next week  Follow Up Plan: Additional outreach attempts will be made to reach the patient to complete the Transitions of Care (Post Inpatient/ED visit) call.   Pls call/ message for questions,  Emilyn Ruble Mckinney Joyia Riehle, RN, BSN, CCRN Alumnus RN Care Manager  Transitions of Care  [facility] - [facility] [phone]: direct office

## 2024-05-11 ENCOUNTER — Telehealth: Payer: Self-pay | Admitting: *Deleted

## 2024-05-11 DIAGNOSIS — I509 Heart failure, unspecified: Secondary | ICD-10-CM

## 2024-05-11 DIAGNOSIS — J9611 Chronic respiratory failure with hypoxia: Secondary | ICD-10-CM

## 2024-05-11 DIAGNOSIS — J449 Chronic obstructive pulmonary disease, unspecified: Secondary | ICD-10-CM

## 2024-05-11 DIAGNOSIS — G40909 Epilepsy, unspecified, not intractable, without status epilepticus: Secondary | ICD-10-CM

## 2024-05-11 DIAGNOSIS — Z87898 Personal history of other specified conditions: Secondary | ICD-10-CM

## 2024-05-11 DIAGNOSIS — F17200 Nicotine dependence, unspecified, uncomplicated: Secondary | ICD-10-CM

## 2024-05-11 DIAGNOSIS — Z79891 Long term (current) use of opiate analgesic: Secondary | ICD-10-CM

## 2024-05-11 NOTE — Transitions of Care (Post Inpatient/ED Visit) (Signed)
 05/11/2024  Name: Margel Joens [facility] MRN: [id number] DOB: November 28, 1965  Today's TOC FU Call Status: Today's TOC FU Call Status:: Successful TOC FU Call Completed TOC FU Call Complete Date: 05/11/24 Patient's Name and Date of Birth confirmed.  Transition Care Management Follow-up Telephone Call Date of Discharge: 05/06/24 Discharge Facility: [facility]) Type of Discharge: Inpatient Admission Primary Inpatient Discharge Diagnosis:: Lower extremity swelling- cellulitis: MRSA positive; hypotension/ weakness How have you been since you were released from the hospital?: Same (Per mother/ caregiver: I think she's better overall, but she has fallen multiple times since she got home from the hospital. I am doing the best I can, but I can't get her off the floor, and I am having a hard time trying to care for myself and for her) Any questions or concerns?: Yes Patient Questions/Concerns:: Patient's mother/ caregiver is clearly overwhelmed with patient's care needs at home: reports that she is 58 years old and is having a difficult time trying to manage patient's multiple care needs without any assistance from anyone elae- patient's mother is tearful and cries on and off throughout [facility] call today Patient Questions/Concerns Addressed: Other: (VBCI CSW referral placed; made PCP aware of caregiver concerns verbalized during [facility] call)  Items Reviewed: Did you receive and understand the discharge instructions provided?: Yes (thoroughly reviewed with patient's caregiver/ mother who verbalizes good understanding of same) Medications obtained,verified, and reconciled?: Yes (Medications Reviewed) (Full medication reconciliation/ review completed; no concerns or discrepancies identified; confirmed patient obtained/ is taking all newly Rx'd medications as instructed; mother-manages medications and denies questions/ concerns around medications today) Any new allergies since your  discharge?: No Dietary orders reviewed?: Yes Type of Diet Ordered:: Low salt, heart healthy as much as possible Do you have support at home?: Yes People in Home [RPT]: parent(s) Name of Support/Comfort Primary Source: Patient's mother reports patient requires assistance with most self-care activities; resides with mother--provides care/ assistance as/ if needed/ indicated  Medications Reviewed Today: Medications Reviewed Today     Reviewed by Alif Petrak M, RN (Registered Nurse) on 05/11/24 at 1621  Med List Status: <None>   Medication Order Taking? Sig Documenting Provider Last Dose Status Informant  acetaminophen  (TYLENOL ) 500 MG tablet [id number] Yes Take 1,000 mg by mouth every 6 (six) hours as needed for moderate pain (pain score 4-6). [provider]  Active Self  albuterol  (VENTOLIN  HFA) 108 (90 Base) MCG/ACT inhaler [id number] Yes Inhale 2 puffs into the lungs. Take two (2) puffs by mouth every 4 to 6 hours as needed for wheezing or shortness of breath [provider]  Active Self           Med Note GAYLENE DARVIN JINNY Austin May 03, 2024 10:24 PM)    SONJIA JESSE SCHLOSSMAN MEDICATION [id number] Yes Knee-high, medium compression, graduated compression stockings. Apply to lower extremities. [email or web address], Zippered Compression Stockings, medium circ, long length [name], Susan K, MD  Active Self  apixaban  (ELIQUIS ) 5 MG TABS tablet [id number] Yes Take 1 tablet (5 mg total) by mouth 2 (two) times daily. [name], Susan K, MD  Active Self  bisacodyl  (DULCOLAX) 5 MG EC tablet [id number] Yes Take 1 tablet (5 mg total) by mouth daily as needed for moderate constipation. Maree Hue, MD  Active Self           Med Note (BEJOS, ANJA J   Sun May 03, 2024 10:26 PM)    carbamazepine  (TEGRETOL  XR) 100 MG 12 hr tablet [id number] Yes  Take 1 tablet (100 mg total) by mouth 2 (two) times daily. Wouk, Devaughn Sayres, MD  Active Self  cefdinir  (OMNICEF ) 300 MG capsule [id number]  Take 1  capsule (300 mg total) by mouth 2 (two) times daily for 5 days.  Patient not taking: Reported on 05/11/2024   Von Bellis, MD  Active   Cholecalciferol  (VITAMIN D -3 PO) [id number] Yes Take 1 capsule by mouth every morning. [provider]  Active Self  collagenase  (SANTYL ) 250 UNIT/GM ointment [id number] Yes Apply 1 Application topically daily. [name], Susan K, MD  Active Self  doxycycline  (VIBRA -TABS) 100 MG tablet [id number]  Take 1 tablet (100 mg total) by mouth 2 (two) times daily for 5 days.  Patient not taking: Reported on 05/11/2024   Von Bellis, MD  Active   DULoxetine  (CYMBALTA ) 60 MG capsule [id number] Yes Take 1 capsule (60 mg total) by mouth daily. [name], Susan K, MD  Active Self  feeding supplement (ENSURE PLUS HIGH PROTEIN) LIQD [id number]  Take 237 mLs by mouth 3 (three) times daily between meals.  Patient not taking: Reported on 05/11/2024   Von Bellis, MD  Active Self  folic acid  (FOLVITE ) 1 MG tablet [id number] Yes Take 1 tablet (1 mg total) by mouth daily. Von Bellis, MD  Active   gabapentin  (NEURONTIN ) 300 MG capsule [id number] Yes Take 1 capsule (300 mg total) by mouth 4 (four) times daily. Maree Hue, MD  Active Self  lamoTRIgine  (LAMICTAL ) 25 MG tablet [id number] Yes Take 2 tablets (50 mg total) by mouth 2 (two) times daily. Wouk, Devaughn Sayres, MD  Active Self  levETIRAcetam  (KEPPRA ) 750 MG tablet [id number] Yes Take 2 tablets (1,500 mg total) by mouth 2 (two) times daily. Kandis Devaughn Sayres, MD  Active Self  Melatonin 10 MG TABS [id number] Yes Take 10 mg by mouth at bedtime. [provider]  Active Self  midodrine  (PROAMATINE ) 5 MG tablet [id number] Yes Take 5 mg by mouth 3 (three) times daily with meals.  Patient taking differently: Take 5 mg by mouth 3 (three) times daily with meals. 05/11/24: Caregiver/ mother reports patient takes prn: TID for SBP < 100   [provider]  Active   mirtazapine  (REMERON ) 7.5 MG tablet [id number] Yes TAKE 1 TABLET  BY MOUTH AT BEDTIME. [name], Susan K, MD  Active Self  morphine  (MSIR) 15 MG tablet [id number] Yes Take 1 tablet (15 mg total) by mouth every 8 (eight) hours as needed for severe pain (pain score 7-10). Jens Durand, MD  Active Self  JESSE SCHLOSSMAN [id number]  Apply 1 Application topically 3 (three) times daily as needed. Rock Salt Cream (Similar to [facility])  Patient not taking: Reported on 05/11/2024   [provider]  Active Self  ondansetron  (ZOFRAN ) 4 MG tablet [id number] Yes Take 1 tablet (4 mg total) by mouth every 8 (eight) hours as needed for nausea or vomiting. [name], Susan K, MD  Active Self  pantoprazole  (PROTONIX ) 40 MG tablet [id number] Yes Take 1 tablet (40 mg total) by mouth daily. [name], Susan K, MD  Active Self  potassium chloride  SA (KLOR-CON  M) 20 MEQ tablet [id number] Yes TAKE 1 TABLET BY MOUTH EVERY DAY [name], Susan K, MD  Active Self  torsemide  (DEMADEX ) 20 MG tablet [id number] Yes Take 1 tablet (20 mg total) by mouth daily. Von Bellis, MD  Active            Home Care and Equipment/Supplies: Were Home Health Services Ordered?: Yes (see concerns as per narrative above) Name  of Home Health Agency:: [facility] Has Agency set up a time to come to your home?: No EMR reviewed for Home Health Orders: Home Health Not Ordered Any new equipment or medical supplies ordered?: No  Functional Questionnaire: Do you need assistance with bathing/showering or dressing?: Yes (mother- caregiver assists as needed/ indicated) Do you need assistance with meal preparation?: Yes (mother- caregiver prepares meals) Do you need assistance with eating?: No Do you have difficulty maintaining continence: Yes (mother- caregiver assists as needed/ indicated) Do you need assistance with getting out of bed/getting out of a chair/moving?: Yes (mother- caregiver assists as needed/ indicated) Do you have difficulty managing or taking your medications?: Yes (mother- caregiver manages all aspects  of medication administration)  Follow up appointments reviewed: PCP Follow-up appointment confirmed?: Yes Date of PCP follow-up appointment?: 05/13/24 Follow-up Provider: PCP- Dr. [name] [facility] Follow-up appointment confirmed?: NA Do you need transportation to your follow-up appointment?: No Do you understand care options if your condition(s) worsen?: Yes-patient verbalized understanding  SDOH Interventions Today    Flowsheet Row Most Recent Value  SDOH Interventions   Food Insecurity Interventions Intervention Not Indicated  [per caregiver/ mother]  Housing Interventions Intervention Not Indicated  [per caregiver/ mother]  Transportation Interventions Intervention Not Indicated  [per caregiver/ mother- mother provides all transportation,  reports occasionally one of her friends might take her somewhere, but mostly it is me that takes her everywhere sher needs to go]  Utilities Interventions Intervention Not Indicated  [per caregiver/ mother]   See TOC assessment tabs for additional assessment/ TOC intervention information  Successfully enrolled into 30-day TOC program  Pls call/ message for questions,  Beatris Blinda Lawrence, RN, BSN, Media planner  Transitions of Care  [facility] - Population Health  [place] [phone]: direct office

## 2024-05-11 NOTE — Patient Instructions (Signed)
 Visit Information  Thank you for taking time to visit with me today. Please don't hesitate to contact me if I can be of assistance to you before our next scheduled telephone appointment.  Our next appointment is by telephone on Tuesday May 19, 2024 at 2:00 pm  Please call the care guide team at [phone] if you need to cancel or reschedule your appointment.   Following is a copy of your care plan:   Goals Addressed             This Visit's Progress    VBCI Transitions of Care (TOC) Care Plan   On track    Problems:  Recent Hospitalization for treatment of CHF, COPD, and Tobacco Use Functional/Safety concern: multiple falls at home: some with injury/ some without: EMS vs. Fire department has to be contacted to get patient up at home when she falls: caregiver unable  Home Health services barrier: caregiver reports home health services not resumed after most recent hospital discharge on 05/06/24 Medication management barrier: caregiver manages all aspects of patient medications: will benefit from ongoing support/ pharmacy referral as needed; polypharmacy Recent hospitalization September 14 - 17, 2025 for LE swelling/ MRSA - positive cellulitis/ hypotension/ CHF exacerbation:  caregiver reports patient adamantly declined SNF placement for short-term rehabilitation: caregiver- mother is elderly and verbalises concerns around her ability to care for patient Fragile state of health, multiple progressing chronic health conditions Ongoing tobacco use: caregiver reports patient will not stop/ not ready Multiple recent hospitalizations: 9- plus x last 12 months  Goal:  Over the next 30 days, the patient will not experience hospital readmission  Interventions:  Transitions of Care: week # 1/ day # 1 Durable Medical Equipment (DME) needs assessed with patient/caregiver Doctor Visits  - discussed the importance of doctor visits Communication with PCP re: enrollment into [facility] 30-day  program Level of Care reviewed with patient/caregiver, options discussed with patient/caregiver, needs identified and provider notified, and VBCI LCSW referral placed; discussed role of LCSW and encouraged caregiver to fully engage with LCSW once outreach is established Contacted provider for patient needs need for new home health order for RN to continue wrapping patient's legs Post discharge activity limitations prescribed by provider reviewed Post-op wound/incision care reviewed with patient/caregiver Provided education around role of home health services with importance of participation/ ongoing engagement Confirmed currently requiring/ using assistive devices for ambulation - walker; provided education/ reinforcement around fall prevention Provided education around benefit of conservative post-hospital discharge activity; need to pace activity without over-doing  Confirmed patient attends pain management clinic at baseline: common side effects of narcotic pain medicine; need to use pain medicine as prescribed/ as needed; safe use of opioid medication  Reviewed upcoming provider office visit for hospital follow up: PCP on 05/13/24: confirmed caregiver is aware of all and has plans to attend alongside patient as scheduled Provided my direct contact information to caregiver should questions/ concerns/ needs arise post-TOC call, prior to next RN CM telephone visit    Heart Failure Interventions: Basic overview and discussion of pathophysiology of Heart Failure reviewed Provided education on low sodium diet Assessed need for readable accurate scales in home Provided education about placing scale on hard, flat surface Advised patient to weigh each morning after emptying bladder Discussed importance of daily weight and advised patient to weigh and record daily Reviewed role of diuretics in prevention of fluid overload and management of heart failure; Discussed the importance of keeping all  appointments with provider Advised patient to discuss  current medications and post-hospital discharge daily weights at home with provider Assessed social determinant of health barriers  Reinforced signs/ symptoms cellulitis/ infection along with corresponding action plan   Patient Self Care Activities:  Attend all scheduled provider appointments Call provider office for new concerns or questions  Participate in Transition of Care Program/Attend TOC scheduled calls Take medications as prescribed   Use assistive devices as needed to prevent falls- your walker Continue to weigh yourself every day to stay on top of early fluid retention: write down your weights every day so you remember what it is from day to day: follow the weight-gain guidelines and action plan to call your doctor if you gain more than 3 lbs overnight, or 5 lbs in one week If you believe your condition is getting worse- contact your care providers (doctors) promptly- reaching out to your doctor early when you have concerns can prevent you from having to go to the hospital Take all recorded weights from home monitoring to upcoming PCP provider office visit for your doctor to review on 05/13/24  Plan:  Telephone follow up appointment with care management team member scheduled for:  05/19/24 at 2:00 pm       Patient verbalizes understanding of instructions and care plan provided today and agrees to view in MyChart. Active MyChart status and patient understanding of how to access instructions and care plan via MyChart confirmed with patient.     If you are experiencing a Mental Health or Behavioral Health Crisis or need someone to talk to, please  call the Suicide and Crisis Lifeline: 988 call the [place]  National Suicide Prevention Lifeline: 906-313-2644 or TTY: 289-697-8917 TTY 414-748-1799) to talk to a trained counselor call 1-800-273-TALK (toll free, 24 hour hotline) go to [facility] [address], [place] [phone]) call the [facility] Crisis Line: [phone] call 911   Beatris Blinda Lawrence, RN, BSN, CCRN Alumnus RN Care Manager  Transitions of Care  VBCI - Population Health  [place] [phone]: direct office

## 2024-05-12 ENCOUNTER — Encounter: Payer: Self-pay | Admitting: *Deleted

## 2024-05-13 ENCOUNTER — Telehealth: Payer: Self-pay

## 2024-05-13 ENCOUNTER — Ambulatory Visit (INDEPENDENT_AMBULATORY_CARE_PROVIDER_SITE_OTHER): Admitting: Family Medicine

## 2024-05-13 ENCOUNTER — Telehealth: Payer: Self-pay | Admitting: Pharmacist

## 2024-05-13 ENCOUNTER — Encounter: Payer: Self-pay | Admitting: Family Medicine

## 2024-05-13 VITALS — BP 119/69 | HR 65 | Temp 97.5°F | Resp 18 | Ht 65.0 in | Wt 152.0 lb

## 2024-05-13 DIAGNOSIS — E538 Deficiency of other specified B group vitamins: Secondary | ICD-10-CM | POA: Diagnosis not present

## 2024-05-13 DIAGNOSIS — G40909 Epilepsy, unspecified, not intractable, without status epilepticus: Secondary | ICD-10-CM | POA: Diagnosis not present

## 2024-05-13 DIAGNOSIS — R600 Localized edema: Secondary | ICD-10-CM

## 2024-05-13 DIAGNOSIS — E876 Hypokalemia: Secondary | ICD-10-CM | POA: Diagnosis not present

## 2024-05-13 DIAGNOSIS — R739 Hyperglycemia, unspecified: Secondary | ICD-10-CM | POA: Diagnosis not present

## 2024-05-13 DIAGNOSIS — E861 Hypovolemia: Secondary | ICD-10-CM

## 2024-05-13 DIAGNOSIS — F418 Other specified anxiety disorders: Secondary | ICD-10-CM | POA: Diagnosis not present

## 2024-05-13 DIAGNOSIS — F321 Major depressive disorder, single episode, moderate: Secondary | ICD-10-CM

## 2024-05-13 DIAGNOSIS — Z87898 Personal history of other specified conditions: Secondary | ICD-10-CM

## 2024-05-13 LAB — BASIC METABOLIC PANEL WITH GFR
BUN/Creatinine Ratio: 16 (ref 9–23)
BUN: 11 mg/dL (ref 6–24)
CO2: 30 mmol/L — ABNORMAL HIGH (ref 20–29)
Calcium: 8.4 mg/dL — ABNORMAL LOW (ref 8.7–10.2)
Chloride: 92 mmol/L — ABNORMAL LOW (ref 96–106)
Creatinine, Ser: 0.7 mg/dL (ref 0.57–1.00)
Glucose: 73 mg/dL (ref 70–99)
Potassium: 3.9 mmol/L (ref 3.5–5.2)
Sodium: 135 mmol/L (ref 134–144)
eGFR: 100 mL/min/1.73 (ref >59)

## 2024-05-13 LAB — POCT GLYCOSYLATED HEMOGLOBIN (HGB A1C): Hemoglobin A1C: 5.2 % (ref 4.0–5.6)

## 2024-05-13 MED ORDER — LAMOTRIGINE 25 MG PO TABS: 50.0000 mg | ORAL_TABLET | Freq: Two times a day (BID) | ORAL | 1 refills | Status: DC

## 2024-05-13 NOTE — Progress Notes (Signed)
 Established Patient Office Visit  Subjective   Patient ID: Abigail [name] [facility], female    DOB: [birth year]  Age: 58 y.o. 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        She is here for a hospital follow-up.  She was admitted with cellulitis of both extremities and fluid overload.  She was treated with IV vancomycin  and IV ceftriaxone  1 g daily.  Her MRSA nasal screen was positive. In the emergency room her potassium was 3.7, albumin 1.9 and total protein 5.5.  BNP was 42.6, troponin was negative and lactic acid was negative.  Her hemoglobin was 9.7 which is close to her baseline.  Chest x-ray showed hypoventilatory changes with streaky atelectasis in the bases. She was asked to follow-up at a rehab facility and she refused.  She is very emotional about this topic and refuses to go to any facility that looks like arrest time.  She does have PT coming to the house and they have asked her to walk on her deck because there is a handrail there.  She has not been walking. Her blood pressure was low in the hospital requiring the use of midodrine  10 mg.  She is to check her blood pressure 3 times a day and if her systolic blood pressure is 100 or less she is to take a 10 mg midodrine .  She was discharged on 20 mg of torsemide   and not Lasix .  Stressed that she should take the torsemide  in the morning.  She was also discharged on potassium chloride  20 mEq daily.  She has a bunch of 10 mEq advise she can take these up before switching to the 20 mg. Her weight today is 148.  She does have scales.  Ask her to weigh herself daily if she gains 5 pounds in a week she needs to double her torsemide  to 40 mg daily until her weight comes back down. She was discharged on doxycycline  twice daily and Omnicef  to twice daily for 5 days.  She did complete this. She reports that she is very depressed.  She is on duloxetine  60 mg and mirtazapine  7.5 mg.  Have asked that she see a psychiatry about her depression.  She denies suicidal and homicidal ideation.  She lives with her mother and this is adding stress to her. Her folic acid  was 2.2 in the hospital so she started folic acid  1 mg daily.  Talk to her about nutrition. She is hallucinating a little bit for example she sees a cat on the side of the road or a cat in the yard.  She is also thought she saw people in the yard that were not there. She reports it takes her about 2 hours to get up and get moving in the mornings.  She cannot concentrate on what she is doing and she is very tired.  She has taken modafinil   in the past and would like to take that again.  Advised this is something she needs to take up with the psychiatrist.  I do not think she should take modafinil  because she is already taking midodrine  for her blood pressure. She reports she is not drinking any alcohol or taking any drugs.     Objective:     BP 119/69 (BP Location: Left Arm, Patient Position: Sitting, Cuff Size: Normal)   Pulse 65   Temp (!) 97.5 F (36.4 C) (Oral)   Resp 18   Ht 5' 5 (1.651 m)   Wt 152 lb (68.9 kg)   SpO2 96%   BMI 25.29 kg/m    Physical Exam Vitals and nursing note reviewed.  Constitutional:      Appearance: Normal appearance.  HENT:     Head: Normocephalic and atraumatic.  Eyes:      Conjunctiva/sclera: Conjunctivae normal.  Cardiovascular:     Rate and Rhythm: Normal rate and regular rhythm.  Pulmonary:     Effort: Pulmonary effort is normal.     Breath sounds: Normal breath sounds.  Musculoskeletal:     Right lower leg: Edema present.     Left lower leg: Edema present.     Comments: 3 plus pitting edema to the knee bilaterally  Skin:    General: Skin is warm and dry.  Neurological:     Mental Status: She is alert and oriented to person, place, and time.  Psychiatric:        Mood and Affect: Mood normal.        Behavior: Behavior normal.        Thought Content: Thought content normal.        Judgment: Judgment normal.          Results for orders placed or performed in visit on 05/13/24  POCT glycosylated hemoglobin (Hb A1C)  Result Value Ref Range   Hemoglobin A1C 5.2 4.0 - 5.6 %   HbA1c POC (<> result, manual entry)     HbA1c, POC (prediabetic range)     HbA1c, POC (controlled diabetic range)        The 10-year ASCVD risk score (Arnett DK, et al., 2019) is: 10.9%    Assessment & Plan:  Elevated blood sugar -     POCT glycosylated hemoglobin (Hb A1C)  Hypokalemia Assessment & Plan: She is taking potassium chloride  20 mEq daily.  Will check her potassium level today.  Orders: -     Basic metabolic panel with GFR  Nonintractable epilepsy without status epilepticus, unspecified epilepsy type (HCC) -     lamoTRIgine ; Take 2 tablets (50 mg total) by mouth 2 (two) times daily.  Dispense: 60 tablet; Refill: 1  Depression, major, single episode, moderate (HCC) -     Ambulatory referral to Psychiatry  Elevated random blood glucose level Assessment & Plan: She had multiple random blood glucoses that were elevated in the hospital.  Check an A1c today and it is 5.2.   Depression with anxiety Assessment & Plan: She is on duloxetine  60 mg daily and Remeron  7.5 nightly.  She still reports terrible depression.  Have asked her to see psychiatry  about this.   Low folate Assessment & Plan: Advised her folate is decreased due to her diet.  Please continue taking folate 1 mg daily and we will assess this over time.   Hypotension due to hypovolemia Assessment & Plan: She has midodrine  10 mg.  She checks her blood pressure 3 times  a day and if her systolic blood pressure is 100 or less she takes a midodrine .   Peripheral edema Assessment & Plan: Her protein levels are low with albumin 1.9 and total protein 5.5.  Encouraging her to eat protein 3 meals a day. She has compression socks to the knees on they are 20/30 pounds.  She got these at [facility] pharmacy.  Please put these on in the morning and take them off at night before bed.  Walking some during the day will help you relieve some of this swelling.        Return in about 2 weeks (around 05/27/2024).    Ahmed Inniss K Conley Pawling, MD

## 2024-05-13 NOTE — Telephone Encounter (Signed)
 Copied from CRM #[id number]. Topic: Referral - Question >> May 13, 2024  1:14 PM Antony RAMAN wrote: Reason for CRM: referral that was sent to dr zack baslow only does neurology for cancer patients, not psychiatry

## 2024-05-13 NOTE — Progress Notes (Unsigned)
 Receive message from PCP requesting outreach to patient/patient's mom (who manages patient's medications) to complete medication review.

## 2024-05-13 NOTE — Assessment & Plan Note (Signed)
 She has midodrine  10 mg.  She checks her blood pressure 3 times a day and if her systolic blood pressure is 100 or less she takes a midodrine .

## 2024-05-13 NOTE — Assessment & Plan Note (Signed)
 She had multiple random blood glucoses that were elevated in the hospital.  Check an A1c today and it is 5.2.

## 2024-05-13 NOTE — Assessment & Plan Note (Signed)
 She is on duloxetine  60 mg daily and Remeron  7.5 nightly.  She still reports terrible depression.  Have asked her to see psychiatry about this.

## 2024-05-13 NOTE — Assessment & Plan Note (Signed)
 Her protein levels are low with albumin 1.9 and total protein 5.5.  Encouraging her to eat protein 3 meals a day. She has compression socks to the knees on they are 20/30 pounds.  She got these at [facility] pharmacy.  Please put these on in the morning and take them off at night before bed.  Walking some during the day will help you relieve some of this swelling.

## 2024-05-13 NOTE — Assessment & Plan Note (Signed)
 Advised her folate is decreased due to her diet.  Please continue taking folate 1 mg daily and we will assess this over time.

## 2024-05-13 NOTE — Telephone Encounter (Signed)
 Patient states that she did not get her mammogram done due to her right breast being larger than her left which created discomfort.

## 2024-05-13 NOTE — Assessment & Plan Note (Signed)
 She is taking potassium chloride  20 mEq daily.  Will check her potassium level today.

## 2024-05-14 ENCOUNTER — Ambulatory Visit: Payer: Self-pay | Admitting: Family Medicine

## 2024-05-15 ENCOUNTER — Telehealth: Payer: Self-pay

## 2024-05-15 DIAGNOSIS — I5030 Unspecified diastolic (congestive) heart failure: Secondary | ICD-10-CM | POA: Diagnosis not present

## 2024-05-15 DIAGNOSIS — L03119 Cellulitis of unspecified part of limb: Secondary | ICD-10-CM | POA: Diagnosis not present

## 2024-05-15 DIAGNOSIS — E538 Deficiency of other specified B group vitamins: Secondary | ICD-10-CM | POA: Diagnosis not present

## 2024-05-15 DIAGNOSIS — I872 Venous insufficiency (chronic) (peripheral): Secondary | ICD-10-CM | POA: Diagnosis not present

## 2024-05-15 DIAGNOSIS — G43909 Migraine, unspecified, not intractable, without status migrainosus: Secondary | ICD-10-CM | POA: Diagnosis not present

## 2024-05-15 DIAGNOSIS — E876 Hypokalemia: Secondary | ICD-10-CM | POA: Diagnosis not present

## 2024-05-15 DIAGNOSIS — F32A Depression, unspecified: Secondary | ICD-10-CM | POA: Diagnosis not present

## 2024-05-15 DIAGNOSIS — F419 Anxiety disorder, unspecified: Secondary | ICD-10-CM | POA: Diagnosis not present

## 2024-05-15 NOTE — Progress Notes (Signed)
 Care Guide Pharmacy Note  05/15/2024 Name: Abigail [name] [facility] MRN: [id number] DOB: 06-16-1966  Referred By: [name], Susan K, MD Reason for referral: Complex Care Management (Outreach to schedule with pharm d )   Abigail [name] is a 58 y.o. year old female who is a primary care patient of [name], Susan K, MD.  Abigail [facility] was referred to the pharmacist for assistance related to: history of seizures   Successful contact was made with the patient to discuss pharmacy services including being ready for the pharmacist to call at least 5 minutes before the scheduled appointment time and to have medication bottles and any blood pressure readings ready for review. The patient agreed to meet with the pharmacist via telephone visit on (date/time).05/20/2024  Jeoffrey Buffalo , RMA     [place]  [facility] [place] [facility] Guide  Direct Dial: [phone]  Website: [email or web address]

## 2024-05-18 ENCOUNTER — Other Ambulatory Visit: Payer: Self-pay | Admitting: Family Medicine

## 2024-05-18 DIAGNOSIS — L03119 Cellulitis of unspecified part of limb: Secondary | ICD-10-CM | POA: Diagnosis not present

## 2024-05-18 DIAGNOSIS — J449 Chronic obstructive pulmonary disease, unspecified: Secondary | ICD-10-CM | POA: Diagnosis not present

## 2024-05-18 DIAGNOSIS — E876 Hypokalemia: Secondary | ICD-10-CM | POA: Diagnosis not present

## 2024-05-18 DIAGNOSIS — F32A Depression, unspecified: Secondary | ICD-10-CM | POA: Diagnosis not present

## 2024-05-18 DIAGNOSIS — I5030 Unspecified diastolic (congestive) heart failure: Secondary | ICD-10-CM | POA: Diagnosis not present

## 2024-05-18 DIAGNOSIS — F419 Anxiety disorder, unspecified: Secondary | ICD-10-CM | POA: Diagnosis not present

## 2024-05-18 DIAGNOSIS — I872 Venous insufficiency (chronic) (peripheral): Secondary | ICD-10-CM | POA: Diagnosis not present

## 2024-05-18 DIAGNOSIS — G40909 Epilepsy, unspecified, not intractable, without status epilepticus: Secondary | ICD-10-CM

## 2024-05-18 DIAGNOSIS — G43909 Migraine, unspecified, not intractable, without status migrainosus: Secondary | ICD-10-CM | POA: Diagnosis not present

## 2024-05-19 ENCOUNTER — Other Ambulatory Visit: Payer: Self-pay | Admitting: *Deleted

## 2024-05-19 NOTE — Patient Instructions (Signed)
 Visit Information  Thank you for taking time to visit with me today. Please don't hesitate to contact me if I can be of assistance to you before our next scheduled telephone appointment.  Our next appointment is by telephone on Wednesday May 27, 2024 at 2:00 pm  Please call the care guide team at [phone] if you need to cancel or reschedule your appointment.   Following are the goals we discussed today:  Patient Self Care Activities:  Attend all scheduled provider appointments Call provider office for new concerns or questions  Participate in Transition of Care Program/Attend TOC scheduled calls Take medications as prescribed   Use assistive devices as needed to prevent falls- your walker Continue to weigh yourself every day to stay on top of early fluid retention: write down your weights every day so you remember what it is from day to day: follow the weight-gain guidelines and action plan to call your doctor if you gain more than 3 lbs overnight, or 5 lbs in one week If you believe your condition is getting worse- contact your care providers (doctors) promptly- reaching out to your doctor early when you have concerns can prevent you from having to go to the hospital   If you are experiencing a Mental Health or Behavioral Health Crisis or need someone to talk to, please  call the Suicide and Crisis Lifeline: 988 call the [place]  National Suicide Prevention Lifeline: 986-442-0255 or TTY: 667-047-2149 TTY 619-683-9967) to talk to a trained counselor call 1-800-273-TALK (toll free, 24 hour hotline) go to [facility] [address], [place] [phone]) call the [facility] Crisis Line: [phone] call 911   Patient/ caregiver verbalizes understanding of instructions and care plan provided today and agrees to view in MyChart. Active MyChart status and patient understanding of how to access instructions and care plan via MyChart confirmed  with patient.      Abigail [name] Abigail Madasyn Heath, RN, BSN, Media planner  Transitions of Care  VBCI - [facility] [phone]: direct office

## 2024-05-19 NOTE — Transitions of Care (Post Inpatient/ED Visit) (Signed)
 Transition of Care week 2/ day # 8  Visit Note  05/19/2024  Name: Abigail [name] [facility] MRN: [id number]          DOB: 08-25-1965  Situation: Patient enrolled in [facility] 30-day program. Visit completed with patient and caregiver- mother by telephone while phone on speaker mode  HIPAA identifiers x 2 verified  Background:  Functional/Safety concern: multiple falls at home: some with injury/ some without: EMS vs. Fire department has to be contacted to get patient up at home when she falls: caregiver unable to do so Home Health services barrier: caregiver reports home health services not resumed after most recent hospital discharge on 05/06/24 Confirmed 05/19/24- home health services through [facility] now active Medication management barrier: caregiver manages all aspects of patient medications: will benefit from ongoing support/ pharmacy referral as needed; polypharmacy Recent hospitalization September 14 - 17, 2025 for LE swelling/ MRSA - positive cellulitis/ hypotension/ CHF exacerbation:  caregiver reports patient adamantly declined SNF placement for short-term rehabilitation: caregiver- mother is elderly and verbalizes concerns around her ability to care for patient Fragile state of health, multiple progressing chronic health conditions Ongoing tobacco use: caregiver reports patient will not stop/ not ready Multiple recent hospitalizations: 9- plus x last 12 months  Initial Transition Care Management Follow-up Telephone Call    Past Medical History:  Diagnosis Date   Anxiety    Arthritis    Back pain    Basal cell carcinoma 10/04/2021   R axilla - needs excised 11/28/21   Basal cell carcinoma 10/04/2021   L antecubital excised 11/14/21   Diarrhea 11/12/2016   Fibromyalgia    Generalized abdominal pain 11/12/2016   H. pylori infection    Hyperlipidemia    IBS (irritable bowel syndrome)    Infectious colitis 04/29/2016   Migraines    Moderate dehydration 04/29/2016   Muscle  pain    Opioid overdose (HCC) 12/07/2022   Reflux    Seizures (HCC)    Unexplained weight loss 11/12/2016   Assessment:  patient and caregiver while phone on speaker mode: Doing fine, better, but everything is pretty much the same as last week when we talked.  Dr. [name] said she does not have heart failure like they said she did.  We are still doing the weights.  Her blood pressures are okay, still on the low said sometimes.  Have not had any falls since last week, still using walker and the home health people coming.  We will try to take all of these phone calls    Denies clinical concerns and sounds to be in no distress throughout [facility] 30-day program outreach call today  Patient Reported Symptoms: Cognitive Cognitive Status: Normal speech and language skills, Alert and oriented to person, place, and time, Insightful and able to interpret abstract concepts      Neurological Neurological Review of Symptoms: No symptoms reported    HEENT HEENT Symptoms Reported: No symptoms reported      Cardiovascular Cardiovascular Symptoms Reported: Swelling in legs or feet Other Cardiovascular Symptoms: Continues to report bilateral lower extremity swelling the same as it always is, no changes; patient and mother report: Dr. [name] told us  last week she does not have CHF and we don't need to be worried about that, but we are still checking her weights for now; Denies open areas on bilateral lower extremities: reports wearing compression hose; confirms no longer wrapping legs: Dr. [name] said we don't need to if there are no open areas Does patient have  uncontrolled Hypertension?: No Cardiovascular Management Strategies: Medication therapy, Coping strategies, Routine screening, Weight management Do You Have a Working Readable Scale?: Yes Weight: 153 lb (69.4 kg) (home reported weight 05/19/24)  Respiratory Respiratory Symptoms Reported: No symptoms reported Other Respiratory Symptoms: Denies  shortness of breath and sounds to be in no respiratory distress throughout TOC call; confirms continues smoking- not interested in stopping Respiratory Management Strategies: Weight management, Routine screening, Medication therapy, Coping strategies, Adequate rest, Asthma action plan  Endocrine Endocrine Symptoms Reported: No symptoms reported Is patient diabetic?: No    Gastrointestinal Gastrointestinal Symptoms Reported: No symptoms reported Additional Gastrointestinal Details: Continues to report very good appetite; eating good; normal and regular BM's last BM reported as yesterday Gastrointestinal Management Strategies: Coping strategies    Genitourinary Genitourinary Symptoms Reported: No symptoms reported    Integumentary Integumentary Symptoms Reported: Rash, Skin changes Additional Integumentary Details: Continues to deny open areas on legs; continues to describe bilateral lower extremities as very red and splotchy-rashy looking; Dr. [name] said this is not from heart failure Continues wearing compression stockings Skin Management Strategies: Routine screening, Coping strategies, Adequate rest  Musculoskeletal Musculoskelatal Symptoms Reviewed: Limited mobility, Back pain, Muscle pain, Unsteady gait, Weakness Additional Musculoskeletal Details: confirmed currently requiring/ using assistive devices for ambulation - walker as per baseline; confirmed no new/ recent falls Musculoskeletal Management Strategies: Routine screening, Coping strategies, Medical device, Adequate rest      Psychosocial Psychosocial Symptoms Reported: No symptoms reported   Major Change/Loss/Stressor/Fears (CP): Medical condition, self Techniques to Cope with Loss/Stress/Change: Diversional activities Quality of Family Relationships: helpful, involved, supportive   There were no vitals filed for this visit.  Medications Reviewed Today     Reviewed by Sanora Cunanan M, RN (Registered Nurse) on  05/19/24 at 1403  Med List Status: <None>   Medication Order Taking? Sig Documenting Provider Last Dose Status Informant  acetaminophen  (TYLENOL ) 500 MG tablet [id number]  Take 1,000 mg by mouth every 6 (six) hours as needed for moderate pain (pain score 4-6). [provider]  Active Self  albuterol  (VENTOLIN  HFA) 108 (90 Base) MCG/ACT inhaler [id number]  Inhale 2 puffs into the lungs. Take two (2) puffs by mouth every 4 to 6 hours as needed for wheezing or shortness of breath [provider]  Active Self           Med Note GAYLENE DARVIN JINNY Austin May 03, 2024 10:24 PM)    AMBULATORY NON FORMULARY MEDICATION [id number]  Knee-high, medium compression, graduated compression stockings. Apply to lower extremities. [email or web address], Zippered Compression Stockings, medium circ, long length [name], Susan K, MD  Active Self  apixaban  (ELIQUIS ) 5 MG TABS tablet [id number]  Take 1 tablet (5 mg total) by mouth 2 (two) times daily. [name], Susan K, MD  Active Self  bisacodyl  (DULCOLAX) 5 MG EC tablet [id number]  Take 1 tablet (5 mg total) by mouth daily as needed for moderate constipation. Maree Hue, MD  Active Self           Med Note (BEJOS, ANJA J   Sun May 03, 2024 10:26 PM)    carbamazepine  (TEGRETOL  XR) 100 MG 12 hr tablet [id number]  Take 1 tablet (100 mg total) by mouth 2 (two) times daily. Kandis Devaughn Sayres, MD  Expired 05/13/24 2359 Self  Cholecalciferol  (VITAMIN D -3 PO) [id number]  Take 1 capsule by mouth every morning. [provider]  Active Self  collagenase  (SANTYL ) 250 UNIT/GM ointment [id number]  Apply 1 Application topically daily. [name], Susan K,  MD  Active Self  DULoxetine  (CYMBALTA ) 60 MG capsule [id number]  Take 1 capsule (60 mg total) by mouth daily. [name], Susan K, MD  Active Self  feeding supplement (ENSURE PLUS HIGH PROTEIN) LIQD [id number]  Take 237 mLs by mouth 3 (three) times daily between meals. Von Bellis, MD  Active Self  folic acid  (FOLVITE ) 1 MG  tablet [id number]  Take 1 tablet (1 mg total) by mouth daily. Von Bellis, MD  Active   gabapentin  (NEURONTIN ) 300 MG capsule [id number]  Take 1 capsule (300 mg total) by mouth 4 (four) times daily. Maree Hue, MD  Expired 05/13/24 2359 Self  lamoTRIgine  (LAMICTAL ) 25 MG tablet [id number]  Take 2 tablets (50 mg total) by mouth 2 (two) times daily. [name], Susan K, MD  Active   levETIRAcetam  (KEPPRA ) 750 MG tablet [id number]  Take 2 tablets (1,500 mg total) by mouth 2 (two) times daily. Wouk, Devaughn Sayres, MD  Active Self  Melatonin 10 MG TABS [id number]  Take 10 mg by mouth at bedtime. [provider]  Active Self  midodrine  (PROAMATINE ) 5 MG tablet [id number]  Take 5 mg by mouth 3 (three) times daily with meals.  Patient taking differently: Take 5 mg by mouth 3 (three) times daily with meals. 05/11/24: Caregiver/ mother reports patient takes prn: TID for SBP < 100   [provider]  Active   mirtazapine  (REMERON ) 7.5 MG tablet [id number]  TAKE 1 TABLET BY MOUTH AT BEDTIME. [name], Susan K, MD  Active Self  morphine  (MSIR) 15 MG tablet [id number]  Take 1 tablet (15 mg total) by mouth every 8 (eight) hours as needed for severe pain (pain score 7-10). Jens Durand, MD  Active Self  JESSE SCHLOSSMAN [id number]  Apply 1 Application topically 3 (three) times daily as needed. Rock Salt Cream (Similar to [employer]) [provider]  Active Self  ondansetron  (ZOFRAN ) 4 MG tablet [id number]  Take 1 tablet (4 mg total) by mouth every 8 (eight) hours as needed for nausea or vomiting. [name], Susan K, MD  Active Self  pantoprazole  (PROTONIX ) 40 MG tablet [id number]  Take 1 tablet (40 mg total) by mouth daily. [name], Susan K, MD  Active Self  potassium chloride  SA (KLOR-CON  M) 20 MEQ tablet [id number]  TAKE 1 TABLET BY MOUTH EVERY DAY [name], Susan K, MD  Active Self  torsemide  (DEMADEX ) 20 MG tablet [id number]  Take 1 tablet (20 mg total) by mouth daily. Von Bellis, MD  Active             Recommendation:   PCP Follow-up- as scheduled 05/26/24  --VBCI pharmacist telephone visit 05/20/24  -- VBCI LCSW telephone visit 05/22/24 Specialty provider follow-up- Rheumatology provider 05/28/24 Continue Current Plan of Care  Follow Up Plan:   Telephone follow-up in 1 week- as scheduled 05/27/24  Pls call/ message for questions,  Arianna Haydon Mckinney Elizibeth Breau, RN, BSN, CCRN Alumnus RN Care Manager  Transitions of Care  [facility]I - [facility] [phone]: direct office

## 2024-05-20 ENCOUNTER — Other Ambulatory Visit: Payer: Self-pay | Admitting: Pharmacist

## 2024-05-20 DIAGNOSIS — Z87898 Personal history of other specified conditions: Secondary | ICD-10-CM

## 2024-05-20 DIAGNOSIS — M7989 Other specified soft tissue disorders: Secondary | ICD-10-CM

## 2024-05-20 DIAGNOSIS — I959 Hypotension, unspecified: Secondary | ICD-10-CM

## 2024-05-20 NOTE — Progress Notes (Unsigned)
 05/20/2024 Name: Abigail [name] [facility] MRN: [id number] DOB: [birth year]  Chief Complaint  Patient presents with   Medication Management   Medication Adherence    Abigail [name] is a 58 y.o. year old female who presented for a telephone visit.   They were referred to the pharmacist by their PCP for assistance in managing complex medication management.   Today speak with both patient and mom/caregiver Abigail [name] (listed on DPR in chart)  Conversation limited today as caregiver advises that she needs to leave for another appointment by 3 pm  Subjective:  Care Team: Primary Care Provider: [name], Susan [name], [name] ; Next Scheduled Visit: 05/26/2024 Endocrinologist Abigail Calton Squires, PA Neurologist: Abigail Jannett Hering, [name]; Next Scheduled Visit: 09/08/2024 Vascular Specialist: Abigail Hayworth Gwendlyn SAUNDERS, NP; Next Scheduled Visit: 06/17/2024 Rheumatologist: Abigail Lady Plumb, [name]; Next Scheduled Visit: 05/28/2024 Pain Specialist: Abigail Lot, [name] Social Worker: Abigail [name], [place]; Next Scheduled Visit: 05/22/2024 Nurse Care Manager: Tousey, Laine M, RN; Next Scheduled Visit: 05/27/2024  Medication Access/Adherence  Current Pharmacy:  [id number] [place], [place] - [address] [place] [address] Phone: [phone] Fax: [phone]  [name] [address] [place] [address] Phone: [phone] Fax: [phone]  [facility] [address] [place] [address] Phone: [phone] Fax: [phone]   Patient reports affordability concerns with their medications: No  Patient reports access/transportation concerns to their pharmacy: No  Patient reports adherence concerns with their medications:  No    Reports planning to establish care with psychiatrist as referred by PCP. States contacted psychiatrist recommended by PCP and have left a message  requesting call back  Reports mother manages medications for patient. Fills medications into weekly pillbox - Reports that this strategy is working well; denies missed doses   History of Lower Extremity Swelling/Hypotension:  Patient followed by [facility]  Current medications: - torsemide  20 mg daily each morning - potassium chloride  20 mEq daily - midodrine  5 mg - 1 tablet up to three times daily as needed (Systolic BP <100 mmHg).   Medications previously tried: furosemide   Patient has an automated, upper arm home BP cuff Last checked: today, reading: 107/61, HR 88  Patient denies hypotensive s/sx including dizziness, lightheadedness.   Reports monitors daily weights at home as directed. Reports weight remaining stable (variation within 1-2 lbs)  Current physical activity: reports seeing in home for physical therapy and has started walking some on deck as recommended by PT. Reports using handrail and being cautious to avoid falling   History of Pulmonary Embolism:  Current medications: Eliquis  5 mg twice daily  Per review of inpatient pharmacist note from 12/31/2023, patient started on Eliquis  due to CT Angio Chest positive for single subsegmental pulmonary embolism in the right lower lobe during this admission - Carbamazepine  and DOAC - major interaction noted, but also noted issues with using alternatives as patient treatment compliance also a limiting factor  Patient denies any history of pulmonary embolism, DVT or other blood clots aside from this PE in May   Seizure Disorder:  Patient followed by [facility]  Current medications:  - carbamazepine  ER 100 mg twice daily - lamotrigine  25 mg - 2 tablets (50 mg) twice daily - levetiracetam  750 mg - 2 tablet (1500 mg) twice daily   Previous therapies tried: phenytoin , lacosamide    Objective:    Lab Results  Component Value Date  CREATININE 0.70 05/13/2024   BUN 11 05/13/2024   NA  135 05/13/2024   [name] 3.9 05/13/2024   CL 92 (L) 05/13/2024   CO2 30 (H) 05/13/2024    Lab Results  Component Value Date   CHOL 151 09/30/2023   HDL 23 (L) 09/30/2023   LDLCALC 100 (H) 09/30/2023   TRIG 142 09/30/2023   CHOLHDL 6.6 09/30/2023   BP Readings from Last 3 Encounters:  05/13/24 119/69  05/06/24 (!) 103/59  04/24/24 121/75   Pulse Readings from Last 3 Encounters:  05/13/24 65  05/06/24 83  04/24/24 88     Medications Reviewed Today     Reviewed by Abigail [name] (Pharmacist) on 05/20/24 at 1532  Med List Status: <None>   Medication Order Taking? Sig Documenting Provider Last Dose Status Informant  acetaminophen  (TYLENOL ) 500 MG tablet [id number] Yes Take 1,000 mg by mouth every 6 (six) hours as needed for moderate pain (pain score 4-6). Provider, Historical, [name]  Active Self  albuterol  (VENTOLIN  HFA) 108 (90 Base) MCG/ACT inhaler [id number] Yes Inhale 2 puffs into the lungs. Take two (2) puffs by mouth every 4 to 6 hours as needed for wheezing or shortness of breath Provider, Historical, [name]  Active Self           Med Note Abigail [name] May 03, 2024 10:24 PM)    AMBULATORY NON FORMULARY MEDICATION [id number]  Knee-high, medium compression, graduated compression stockings. Apply to lower extremities. [email or web address], Zippered Compression Stockings, medium circ, long length Abigail [name]  Active Self  apixaban  (ELIQUIS ) 5 MG TABS tablet [id number] Yes Take 1 tablet (5 mg total) by mouth 2 (two) times daily. Abigail [name]  Active Self  bisacodyl  (DULCOLAX) 5 MG EC tablet [id number] Yes Take 1 tablet (5 mg total) by mouth daily as needed for moderate constipation. Abigail Hue, [name]  Active Self           Med Note (BEJOS, ANJA J   Sun May 03, 2024 10:26 PM)    carbamazepine  (TEGRETOL  XR) 100 MG 12 hr tablet [id number] Yes Take 1 tablet (100 mg total) by mouth 2 (two) times daily. Wouk, Abigail Sayres, [name]  Active Self  Cholecalciferol  (VITAMIN  D-3 PO) [id number] Yes Take 1 capsule by mouth every morning. Provider, Historical, [name]  Active Self  collagenase  (SANTYL ) 250 UNIT/GM ointment [id number]  Apply 1 Application topically daily.  Patient not taking: Reported on 05/20/2024   Abigail [name]  Active Self  DULoxetine  (CYMBALTA ) 60 MG capsule [id number] Yes Take 1 capsule (60 mg total) by mouth daily. Abigail [name]  Active Self  feeding supplement (ENSURE PLUS HIGH PROTEIN) LIQD [id number]  Take 237 mLs by mouth 3 (three) times daily between meals. Von Bellis, [name]  Active Self  folic acid  (FOLVITE ) 1 MG tablet [id number] Yes Take 1 tablet (1 mg total) by mouth daily. Von Bellis, [name]  Active   gabapentin  (NEURONTIN ) 300 MG capsule [id number] Yes Take 1 capsule (300 mg total) by mouth 4 (four) times daily.  Patient taking differently: Take 300 mg by mouth 4 (four) times daily as needed.   Abigail Hue, [name]  Active Self  lamoTRIgine  (LAMICTAL ) 25 MG tablet [id number] Yes Take 2 tablets (50 mg total) by mouth 2 (two) times daily. Abigail [name]  Active   levETIRAcetam  (KEPPRA ) 750 MG tablet [id number] Yes Take 2 tablets (1,500 mg total) by mouth 2 (two) times daily. Wouk, [employer]  [place], [name]  Active Self  Melatonin 10 MG TABS [id number] Yes Take 10 mg by mouth at bedtime. Provider, Historical, [name]  Active Self  midodrine  (PROAMATINE ) 5 MG tablet [id number]  Take 5 mg by mouth 3 (three) times daily with meals.  Patient taking differently: Take 5 mg by mouth 3 (three) times daily with meals. 05/11/24: Caregiver/ mother reports patient takes prn: TID for SBP < 100   Provider, Historical, [name]  Active   mirtazapine  (REMERON ) 7.5 MG tablet [id number] Yes TAKE 1 TABLET BY MOUTH AT BEDTIME. Abigail [name]  Active Self  morphine  (MSIR) 15 MG tablet [id number] Yes Take 1 tablet (15 mg total) by mouth every 8 (eight) hours as needed for severe pain (pain score 7-10). Jens Durand, [name]  Active Self  JESSE SCHLOSSMAN [id number]  Apply 1 Application  topically 3 (three) times daily as needed. Rock Salt Cream (Similar to [employer]) Provider, Historical, [name]  Active Self  ondansetron  (ZOFRAN ) 4 MG tablet [id number] Yes Take 1 tablet (4 mg total) by mouth every 8 (eight) hours as needed for nausea or vomiting. Abigail [name]  Active Self  pantoprazole  (PROTONIX ) 40 MG tablet [id number] Yes Take 1 tablet (40 mg total) by mouth daily. Abigail [name]  Active Self  potassium chloride  SA (KLOR-CON  [name]) 20 MEQ tablet [id number] Yes TAKE 1 TABLET BY MOUTH EVERY DAY Abigail [name]  Active Self  torsemide  (DEMADEX ) 20 MG tablet [id number] Yes Take 1 tablet (20 mg total) by mouth daily. Von Bellis, [name]  Active               Assessment/Plan:   Comprehensive medication review performed; medication list updated in electronic medical record - Note contraindicated therapy interaction: Eliquis  + carbamazepine . Recommended to avoid concurrent use of apixaban  with drugs that are strong CYP3A4 inducers and P-gp inducers, such as carbamazepine  as these may decrease serum concentrations of apixaban  - Caution patient for risk of dizziness/sedation with medications including morphine , gabapentin  and mirtazapine , particularly if taken in combination  Patient verbalizes understanding - Reports that she has not been drinking Ensure High protein supplement as does not like the dairy-based forms Discuss non-dairy forms that are available. [facility] if [facility] is in [facility] and/or can be ordered  Share recommendation from PCP from lab results follow up from 9/25 to increase water intake   History of Lower Extremity Swelling/Hypotension: - Recommend to continue to monitor home daily weights as recommended by providers - Recommend to monitor home blood pressure, keep log of results and have this record to review at upcoming medical appointments. Patient to contact provider office sooner if needed for readings outside of established  parameters or symptoms  History of Pulmonary Embolism: - Will collaborate with PCP regarding patient's Eliquis  therapy, including concern about above interaction between Eliquis  + carbamazepine  as well as to discuss planned duration of therapy. Note duration dependent upon factors including whether pulmonary embolism judged to be provoked vs unprovoked  - Will ask provider to consider referral to Hematology  Seizure Disorder:  - Patient to attend follow up appointment with Neurologist in January as scheduled   Follow Up Plan: Clinical Pharmacist will follow up with patient by telephone within the next 30 days  Sharyle Sia, PharmD, [facility] [phone]

## 2024-05-21 DIAGNOSIS — F32A Depression, unspecified: Secondary | ICD-10-CM | POA: Diagnosis not present

## 2024-05-21 DIAGNOSIS — J449 Chronic obstructive pulmonary disease, unspecified: Secondary | ICD-10-CM | POA: Diagnosis not present

## 2024-05-21 DIAGNOSIS — E538 Deficiency of other specified B group vitamins: Secondary | ICD-10-CM | POA: Diagnosis not present

## 2024-05-21 DIAGNOSIS — I5030 Unspecified diastolic (congestive) heart failure: Secondary | ICD-10-CM | POA: Diagnosis not present

## 2024-05-21 DIAGNOSIS — I872 Venous insufficiency (chronic) (peripheral): Secondary | ICD-10-CM | POA: Diagnosis not present

## 2024-05-21 DIAGNOSIS — G43909 Migraine, unspecified, not intractable, without status migrainosus: Secondary | ICD-10-CM | POA: Diagnosis not present

## 2024-05-21 DIAGNOSIS — L03119 Cellulitis of unspecified part of limb: Secondary | ICD-10-CM | POA: Diagnosis not present

## 2024-05-21 DIAGNOSIS — E876 Hypokalemia: Secondary | ICD-10-CM | POA: Diagnosis not present

## 2024-05-21 DIAGNOSIS — F419 Anxiety disorder, unspecified: Secondary | ICD-10-CM | POA: Diagnosis not present

## 2024-05-22 ENCOUNTER — Other Ambulatory Visit: Payer: Self-pay | Admitting: *Deleted

## 2024-05-22 ENCOUNTER — Other Ambulatory Visit: Payer: Self-pay | Admitting: Family Medicine

## 2024-05-22 ENCOUNTER — Encounter: Payer: Self-pay | Admitting: Family Medicine

## 2024-05-22 DIAGNOSIS — I2699 Other pulmonary embolism without acute cor pulmonale: Secondary | ICD-10-CM

## 2024-05-22 NOTE — Patient Instructions (Signed)
 Visit Information  Thank you for taking time to visit with me today. Please don't hesitate to contact me if I can be of assistance to you before our next scheduled appointment.  Our next appointment is by telephone on 06/29/24 at 10:30am Please call the care guide team at [phone] if you need to cancel or reschedule your appointment.   Following is a copy of your care plan:   Goals Addressed             This Visit's Progress    VBCI Social Work Care Plan       Problems:   Lacks knowledge of how to connect to community resources to assist with patient's care needs.  CSW Clinical Goal(s):   Over the next 90 days the Patient will explore community resource options for unmet needs related to respite care, incontinent supplies, and mental health support.  Interventions:  Mental Health:  Evaluation of current treatment plan related to Depression: depressed mood Active listening / Reflection utilized Caregiver stress acknowledged :discussed incontinent supply program through [facility]-next pick up 06/01/24 9-2pm -referral also completed for respite care-[facility] to confirm age requirement  Motivational Interviewing employed Participation in counseling encourage : Collaboration phone call to [facility]-initial appointment scheduled for 06/29/24 at 1pm  PHQ2/PHQ9 completed Solution-Focused Strategies employed: discussed additional options for in home support including family and friends to possibly support    Patient Goals/Self-Care Activities:  Continue taking your medication as prescribed.               Follow up with initial intake with [facility] 06/29/24 1pm  Plan:   Telephone follow up appointment with care management team member scheduled for:  05/27/24        Please call the Suicide and Crisis Lifeline: 988 call the [place]  National Suicide Prevention Lifeline: 405 451 6613 or TTY: 2266387763 TTY  (865)046-6632) to talk to a trained counselor call 1-800-273-TALK (toll free, 24 hour hotline) call 911 if you are experiencing a Mental Health or Behavioral Health Crisis or need someone to talk to.  Patient verbalizes understanding of instructions and care plan provided today and agrees to view in MyChart. Active MyChart status and patient understanding of how to access instructions and care plan via MyChart confirmed with patient.     Roddie Riegler, LCSW [place]  [facility], [facility] Licensed Clinical Social Worker  Direct Dial: [phone]

## 2024-05-22 NOTE — Patient Outreach (Signed)
 Complex Care Management   Visit Note  05/22/2024  Name:  Abigail [name] [facility] MRN: [id number] DOB: [birth year]  Situation: Referral received for Complex Care Management related to Mental/Behavioral Health diagnosis depression, caregiver stress, in home care support. I obtained verbal consent from Patient.  Visit completed with Parent Patient  on the phone  Background:   Past Medical History:  Diagnosis Date   Anxiety    Arthritis    Back pain    Basal cell carcinoma 10/04/2021   R axilla - needs excised 11/28/21   Basal cell carcinoma 10/04/2021   L antecubital excised 11/14/21   Diarrhea 11/12/2016   Fibromyalgia    Generalized abdominal pain 11/12/2016   H. pylori infection    Hyperlipidemia    IBS (irritable bowel syndrome)    Infectious colitis 04/29/2016   Migraines    Moderate dehydration 04/29/2016   Muscle pain    Opioid overdose (HCC) 12/07/2022   Reflux    Seizures (HCC)    Unexplained weight loss 11/12/2016    Assessment: Patient Reported Symptoms:  Cognitive Cognitive Status: Normal speech and language skills, Alert and oriented to person, place, and time, Insightful and able to interpret abstract concepts Cognitive/Intellectual Conditions Management [RPT]: Other Other: hisotry of seizure disorder   Health Maintenance Behaviors: Annual physical exam Healing Pattern: Slow Health Facilitated by: Healthy diet, Rest  Neurological Neurological Review of Symptoms: Headaches [place] Neurological Symptoms/Conditions [RPT]: history of seizure disorder-Neurologist does not want her left alone Neurological Management Strategies: Medication therapy, Routine screening, Coping strategies, Adequate rest Neurological Self-Management Outcome: 3 (uncertain) Neurological Comment: Active with Dr. Loreli  HEENT HEENT Symptoms Reported: No symptoms reported      Cardiovascular Cardiovascular Symptoms Reported: Swelling in legs or feet Other Cardiovascular Symptoms: wears  compression stockings, Redell Shank PA ([facility]) recommended thigh high compression hose to be used-not yet Does patient have uncontrolled Hypertension?: No Cardiovascular Management Strategies: Medication therapy, Coping strategies, Routine screening, Weight management Weight: 153 lb (69.4 kg) Cardiovascular Self-Management Outcome: 3 (uncertain)  Respiratory Respiratory Symptoms Reported: No symptoms reported    Endocrine Endocrine Symptoms Reported: No symptoms reported Is patient diabetic?: No    Gastrointestinal Gastrointestinal Symptoms Reported: No symptoms reported      Genitourinary Genitourinary Symptoms Reported: Incontinence Genitourinary Management Strategies: Incontinence garment/pad (wears depends)  Integumentary Integumentary Symptoms Reported: No symptoms reported    Musculoskeletal Musculoskelatal Symptoms Reviewed: Limited mobility, Muscle pain, Unsteady gait, Weakness Additional Musculoskeletal Details: uses walker-followed by pain management Musculoskeletal Management Strategies: Routine screening, Coping strategies, Adequate rest Falls in the past year?: Yes Number of falls in past year: 2 or more Was there an injury with Fall?: Yes Fall Risk Category Calculator: 3 Patient Fall Risk Level: High Fall Risk Patient at Risk for Falls Due to: History of fall(s), Impaired balance/gait, Impaired mobility, Medication side effect Fall risk Follow up: Falls prevention discussed  Psychosocial Psychosocial Symptoms Reported: Depression - if selected complete PHQ 2-9 Other Psychosocial Conditions: Depressed mood-referral completed with [facility] -collaboration phone call to [facility]-Monday 06/29/24 1:30pm new patient appointment Dr. Eappen Behavioral Management Strategies: Medication therapy Major Change/Loss/Stressor/Fears (CP): Medical condition, self Techniques to Cope with Loss/Stress/Change: Diversional  activities Quality of Family Relationships: involved, helpful, supportive Do you feel physically threatened by others?: No    05/22/2024    PHQ2-9 Depression Screening   Little interest or pleasure in doing things Nearly every day  Feeling down, depressed, or hopeless Nearly every day  PHQ-2 - Total Score  6  Trouble falling or staying asleep, or sleeping too much Nearly every day  Feeling tired or having little energy Nearly every day  Poor appetite or overeating  Several days  Feeling bad about yourself - or that you are a failure or have let yourself or your family down More than half the days  Trouble concentrating on things, such as reading the newspaper or watching television More than half the days  Moving or speaking so slowly that other people could have noticed.  Or the opposite - being so fidgety or restless that you have been moving around a lot more than usual Several days  Thoughts that you would be better off dead, or hurting yourself in some way Not at all  PHQ2-9 Total Score 18  If you checked off any problems, how difficult have these problems made it for you to do your work, take care of things at home, or get along with other people Very difficult  Depression Interventions/Treatment Medication, Referral to Psychiatry    There were no vitals filed for this visit.  Medications Reviewed Today     Reviewed by Ermalinda Lenn HERO, LCSW (Social Worker) on 05/22/24 at 0940  Med List Status: <None>   Medication Order Taking? Sig Documenting Provider Last Dose Status Informant  acetaminophen  (TYLENOL ) 500 MG tablet [id number] Yes Take 1,000 mg by mouth every 6 (six) hours as needed for moderate pain (pain score 4-6). [provider]  Active Self  albuterol  (VENTOLIN  HFA) 108 (90 Base) MCG/ACT inhaler [id number] Yes Inhale 2 puffs into the lungs. Take two (2) puffs by mouth every 4 to 6 hours as needed for wheezing or shortness of breath [provider]  Active Self            Med Note GAYLENE DARVIN JINNY [place] May 03, 2024 10:24 PM)    SONJIA JESSE SCHLOSSMAN MEDICATION [id number] Yes Knee-high, medium compression, graduated compression stockings. Apply to lower extremities. [email or web address], Zippered Compression Stockings, medium circ, long length [name], Susan K, MD  Active Self  apixaban  (ELIQUIS ) 5 MG TABS tablet [id number] Yes Take 1 tablet (5 mg total) by mouth 2 (two) times daily. [name], Susan K, MD  Active Self  bisacodyl  (DULCOLAX) 5 MG EC tablet [id number] Yes Take 1 tablet (5 mg total) by mouth daily as needed for moderate constipation. Maree Hue, MD  Active Self           Med Note (BEJOS, ANJA J   Sun May 03, 2024 10:26 PM)    carbamazepine  (TEGRETOL  XR) 100 MG 12 hr tablet [id number] Yes Take 1 tablet (100 mg total) by mouth 2 (two) times daily. Wouk, Devaughn Sayres, MD  Active Self  Cholecalciferol  (VITAMIN D -3 PO) [id number] Yes Take 1 capsule by mouth every morning. [provider]  Active Self  collagenase  (SANTYL ) 250 UNIT/GM ointment [id number]  Apply 1 Application topically daily.  Patient not taking: Reported on 05/22/2024   [name], Susan K, MD  Active Self  DULoxetine  (CYMBALTA ) 60 MG capsule [id number] Yes Take 1 capsule (60 mg total) by mouth daily. [name], Susan K, MD  Active Self  feeding supplement (ENSURE PLUS HIGH PROTEIN) LIQD [id number] Yes Take 237 mLs by mouth 3 (three) times daily between meals. Von Bellis, MD  Active Self  folic acid  (FOLVITE ) 1 MG tablet [id number] Yes Take 1 tablet (1 mg total) by mouth daily. Von Bellis, MD  Active   gabapentin  (NEURONTIN ) 300 MG capsule [id number] Yes Take 1 capsule (  300 mg total) by mouth 4 (four) times daily.  Patient taking differently: Take 300 mg by mouth 4 (four) times daily as needed.   Maree Hue, MD  Active Self  lamoTRIgine  (LAMICTAL ) 25 MG tablet [id number]  Take 2 tablets (50 mg total) by mouth 2 (two) times daily. [name], Susan K, MD  Active   levETIRAcetam  (KEPPRA )  750 MG tablet [id number]  Take 2 tablets (1,500 mg total) by mouth 2 (two) times daily. Wouk, Devaughn Sayres, MD  Active Self  Melatonin 10 MG TABS [id number]  Take 10 mg by mouth at bedtime. [provider]  Active Self  midodrine  (PROAMATINE ) 5 MG tablet [id number]  Take 5 mg by mouth 3 (three) times daily with meals.  Patient taking differently: Take 5 mg by mouth 3 (three) times daily with meals. 05/11/24: Caregiver/ mother reports patient takes prn: TID for SBP < 100   [provider]  Active   mirtazapine  (REMERON ) 7.5 MG tablet [id number] Yes TAKE 1 TABLET BY MOUTH AT BEDTIME. [name], Susan K, MD  Active Self  morphine  (MSIR) 15 MG tablet [id number] Yes Take 1 tablet (15 mg total) by mouth every 8 (eight) hours as needed for severe pain (pain score 7-10). Jens Durand, MD  Active Self  JESSE SCHLOSSMAN [id number] Yes Apply 1 Application topically 3 (three) times daily as needed. Rock Salt Cream (Similar to [employer]) [provider]  Active Self  ondansetron  (ZOFRAN ) 4 MG tablet [id number] Yes Take 1 tablet (4 mg total) by mouth every 8 (eight) hours as needed for nausea or vomiting. [name], Susan K, MD  Active Self  pantoprazole  (PROTONIX ) 40 MG tablet [id number] Yes Take 1 tablet (40 mg total) by mouth daily. [name], Susan K, MD  Active Self  potassium chloride  SA (KLOR-CON  M) 20 MEQ tablet [id number] Yes TAKE 1 TABLET BY MOUTH EVERY DAY [name], Susan K, MD  Active Self  torsemide  (DEMADEX ) 20 MG tablet [id number] Yes Take 1 tablet (20 mg total) by mouth daily. Von Bellis, MD  Active             Recommendation:   PCP Follow-up Specialty provider follow-up as scheduled Incontinent supply pick up-[facility] 06/01/24 between 9 and 2pm [facility] 06/29/24 1pm   Follow Up Plan:   Telephone follow up appointment date/time:  05/27/24 9:30am  July Linam Ermalinda HUGHS [place]  [facility], [facility] Licensed Clinical  Social Worker  Direct Dial: [phone]

## 2024-05-22 NOTE — Patient Instructions (Signed)
 It was a pleasure speaking with you both!  Please continue to use the weekly pillbox as well as medication list from latest office visit as adherence aids.  Please feel free to call me when needed for medication related questions or concerns!  Thank you!  Sharyle Sia, PharmD, [facility] [phone]

## 2024-05-25 ENCOUNTER — Ambulatory Visit: Admitting: Family Medicine

## 2024-05-26 ENCOUNTER — Encounter: Payer: Self-pay | Admitting: Family Medicine

## 2024-05-26 ENCOUNTER — Ambulatory Visit: Admitting: Family Medicine

## 2024-05-26 VITALS — BP 103/67 | HR 91 | Temp 98.1°F | Resp 18 | Ht 65.0 in | Wt 164.0 lb

## 2024-05-26 DIAGNOSIS — R64 Cachexia: Secondary | ICD-10-CM

## 2024-05-26 DIAGNOSIS — I2699 Other pulmonary embolism without acute cor pulmonale: Secondary | ICD-10-CM

## 2024-05-26 DIAGNOSIS — E861 Hypovolemia: Secondary | ICD-10-CM | POA: Diagnosis not present

## 2024-05-26 DIAGNOSIS — R600 Localized edema: Secondary | ICD-10-CM

## 2024-05-26 MED ORDER — MIRTAZAPINE 7.5 MG PO TABS: 7.5000 mg | ORAL_TABLET | Freq: Every day | ORAL | 1 refills | Status: DC

## 2024-05-26 MED ORDER — TORSEMIDE 20 MG PO TABS: 20.0000 mg | ORAL_TABLET | Freq: Every day | ORAL | 0 refills | Status: DC

## 2024-05-26 NOTE — Assessment & Plan Note (Signed)
 Checks her blood pressure couple times a day and if her systolic is less than 100 then she takes midodrine  10 mg.  She has only had to do this about 4 times in the last month.  Her blood pressure typically runs about 105 systolic

## 2024-05-26 NOTE — Assessment & Plan Note (Signed)
 Was found on the CTA 12/2023.  There is a major interaction between Eliquis  and carbamazepine .  Needs referral to hematology to determine how long she needs to stay on Eliquis .  She is going to need to be on Eliquis  for a few more months then neurology is going to need to stop the carbamazepine  and hopefully find something else for her seizure disorder.

## 2024-05-26 NOTE — Assessment & Plan Note (Signed)
 Taking torsemide  20 mg daily and potassium chloride  20 mEq daily.  She is wearing compression stockings to the knee and still has 3+ pitting edema.

## 2024-05-26 NOTE — Progress Notes (Signed)
 Established Patient Office Visit  Subjective   Patient ID: Bari [facility], female    DOB: 1965/12/19  Age: 58 y.o. MRN: [id number]  No chief complaint on file.   HPI [facility] [name] is a delightful 58 year old female with epilepsy (intractable epilepsy episode due to noncompliance), pulmonary embolism (occurred 12/2023, on Eliquis ) , cachexia , COPD (emphysema, 10/23/2023 acute respiratory failure, O2 sat 84% when EMS picked her up, now on O2), RA, GCA (Bx proven, treated with steroids), hepatic steatosis, chronic pancreatitis (pancreatic pseudocyst), hypothyroidism, Chronic back pain, hypokalemia, IBS fibromyalgia, Hx aspiration pneumonia, depression/ anxiety, left hip pain and bilateral lower extremity edema (normal ABIs 05/2023) s/p cervical fixation at C5-6 and C6-7.     Pharmacy has made aware of Carbamazepine  and DOAC - major interaction noted, but also noted issues with using alternatives as patient treatment compliance also a limiting factor.  Has asked her to see Hematology to determine how long she needs to be on Eliquis .  She called and got the Oncology answering so did not make appointment.  Advised that Hematology Is a part of Oncology.  Gave her the phone number again.  She is to schedule appointment herself.    Discussed the use of AI scribe software for clinical note transcription with the patient, who gave verbal consent to proceed.  History of Present Illness   Alyssabeth [facility] is a 58 year old female with chronic pain and rheumatoid arthritis who presents with worsening back pain.  She experiences significant back pain that is persistent and worsens with movement. Various treatments, including Salonpas, heating bags, and Lidocaine  cream, provide temporary relief, but the pain returns more intensely once these effects wear off. Prescription lidocaine  patches used during hospital stays have been helpful. She is currently on pain  medication, but it has not alleviated her back pain, and she describes experiencing 'breakthrough pain' between doses. Physical therapy is expected to resume next week after a pause following her last hospital stay.  She is interested in getting RFA.  She had this in the past and it was very successful.  She monitors her blood pressure at home, with a recent reading of 102/65 mmHg. She takes midodrine  when her blood pressure is low, systolic of 100 or less. She has used midodrine  approximately four times recently. She is concerned about the thickness of her blood due to her use of Eliquis  5mg  BID and recounts an incident where a compression sock caused a sore on her leg, leading to significant bleeding. She has a history of pulmonary embolism and is currently on blood thinners, awaiting a referral to hematology to discuss the duration of her anticoagulation therapy. An appointment with a hematologist is upcoming.  She experiences swelling in her legs. She wears compression stockings and notes weight fluctuations, partially due to this swelling. She has been snacking more frequently, consuming items like cheese crackers and candy, while trying to manage her sodium intake.  She has rheumatoid arthritis and fibromyalgia, for which she previously took methotrexate. Currently, she is not on any specific medication for rheumatoid arthritis but is scheduled for a Reclast infusion for osteoporosis in April. She takes vitamin D  but not calcium .  She reports ongoing depression and is awaiting an appointment with a psychiatrist in November. She has been on Cymbalta  for a long time, which she believes helps with her pain, but she is considering a change in her antidepressant medication.   Her PHQ-9 is 20  and her GAD-7 score is 19.  She denies suicidal or homicidal ideation.  She physically feels bad every day and that adds to her depression and anxiety.  She reports being tired and having little energy.  She wants a  refill of modafinil  because this helps with her energy level.  I am concerned that she cannot take modafinil  and midodrine  at the same time.  She needs to discuss the modafinil  with her psychiatrist.     ROS    Objective:     BP 103/67 (BP Location: Right Arm, Patient Position: Sitting, Cuff Size: Normal)   Pulse 91   Temp 98.1 F (36.7 C) (Oral)   Resp 18   Ht 5' 5 (1.651 m)   Wt 164 lb (74.4 kg)   SpO2 90%   BMI 27.29 kg/m    Physical Exam Vitals and nursing note reviewed.  Constitutional:      Appearance: Normal appearance.  HENT:     Head: Normocephalic and atraumatic.  Eyes:     Conjunctiva/sclera: Conjunctivae normal.  Cardiovascular:     Rate and Rhythm: Normal rate and regular rhythm.  Pulmonary:     Effort: Pulmonary effort is normal.     Breath sounds: Normal breath sounds.  Musculoskeletal:     Right lower leg: No edema.     Left lower leg: No edema.  Skin:    General: Skin is warm and dry.  Neurological:     Mental Status: She is alert and oriented to person, place, and time.  Psychiatric:        Mood and Affect: Mood normal.        Behavior: Behavior normal.        Thought Content: Thought content normal.        Judgment: Judgment normal.          No results found for any visits on 05/26/24.    The 10-year ASCVD risk score (Arnett DK, et al., 2019) is: 8.3%    Assessment & Plan:  Peripheral edema Assessment & Plan: Taking torsemide  20 mg daily and potassium chloride  20 mEq daily.  She is wearing compression stockings to the knee and still has 3+ pitting edema.  Orders: -     Torsemide ; Take 1 tablet (20 mg total) by mouth daily.  Dispense: 30 tablet; Refill: 0  Cachexia -     Mirtazapine ; Take 1 tablet (7.5 mg total) by mouth at bedtime.  Dispense: 90 tablet; Refill: 1  Pulmonary embolism and infarction [facility]) Assessment & Plan: Was found on the CTA 12/2023.  There is a major interaction between Eliquis  and carbamazepine .  Needs  referral to hematology to determine how long she needs to stay on Eliquis .  She is going to need to be on Eliquis  for a few more months then neurology is going to need to stop the carbamazepine  and hopefully find something else for her seizure disorder.   Hypotension due to hypovolemia Assessment & Plan: Checks her blood pressure couple times a day and if her systolic is less than 100 then she takes midodrine  10 mg.  She has only had to do this about 4 times in the last month.  Her blood pressure typically runs about 105 systolic      Return if symptoms worsen or fail to improve.    Jaelin Devincentis K Juliene Kirsh, MD

## 2024-05-27 ENCOUNTER — Telehealth: Payer: Self-pay

## 2024-05-27 ENCOUNTER — Other Ambulatory Visit: Payer: Self-pay | Admitting: *Deleted

## 2024-05-27 ENCOUNTER — Ambulatory Visit: Admitting: Family Medicine

## 2024-05-27 NOTE — Patient Outreach (Signed)
 Complex Care Management   Visit Note  05/27/2024  Name:  Abigail [name] [facility] MRN: [id number] DOB: [birth year]  Situation: Referral received for Complex Care Management related to Mental/Behavioral Health diagnosis depression, caregiver stress, in home care support. I obtained verbal consent from Patient.  Visit completed with Parent on the phone Background:   Past Medical History:  Diagnosis Date   Anxiety    Arthritis    Back pain    Basal cell carcinoma 10/04/2021   R axilla - needs excised 11/28/21   Basal cell carcinoma 10/04/2021   L antecubital excised 11/14/21   Diarrhea 11/12/2016   Fibromyalgia    Generalized abdominal pain 11/12/2016   H. pylori infection    Hyperlipidemia    IBS (irritable bowel syndrome)    Infectious colitis 04/29/2016   Migraines    Moderate dehydration 04/29/2016   Muscle pain    Opioid overdose (HCC) 12/07/2022   Reflux    Seizures (HCC)    Unexplained weight loss 11/12/2016    Assessment: Patient Reported Symptoms:  Cognitive Cognitive Status: Normal speech and language skills, Alert and oriented to person, place, and time, Insightful and able to interpret abstract concepts Cognitive/Intellectual Conditions Management [RPT]: Other Other: history of seizure disorder   Health Maintenance Behaviors: Annual physical exam Healing Pattern: Slow Health Facilitated by: Healthy diet  Neurological Neurological Review of Symptoms: Headaches Neurological Management Strategies: Medication therapy, Routine screening, Coping strategies, Adequate rest Neurological Comment: Remains active with Dr. Ewell follow up in January 2026  HEENT HEENT Symptoms Reported: No symptoms reported      Cardiovascular Cardiovascular Symptoms Reported: Swelling in legs or feet Other Cardiovascular Symptoms: continues to wear compression stockings-still does not have thigh high compression hose-checking [employer]-sees Brian Pace([facility]  06/17/24) Does patient have uncontrolled Hypertension?: No Cardiovascular Management Strategies: Medication therapy, Coping strategies, Routine screening, Weight management Do You Have a Working Readable Scale?: Yes  Respiratory Respiratory Symptoms Reported: No symptoms reported    Endocrine Endocrine Symptoms Reported: No symptoms reported    Gastrointestinal Gastrointestinal Symptoms Reported: No symptoms reported      Genitourinary Genitourinary Symptoms Reported: Incontinence    Integumentary Integumentary Symptoms Reported: Wound Additional Integumentary Details: place on left leg and right leg, RN coming today to re-wrap legs Skin Management Strategies: Routine screening, Coping strategies, Adequate rest  Musculoskeletal Musculoskelatal Symptoms Reviewed: Limited mobility, Muscle pain, Unsteady gait Additional Musculoskeletal Details: uses walker-followed by pain management-next appointment 06/18/24 Musculoskeletal Management Strategies: Routine screening, Coping strategies, Adequate rest      Psychosocial Psychosocial Symptoms Reported: Depression - if selected complete PHQ 2-9 Other Psychosocial Conditions: [facility]-Dr. Eappen 06/29/24 Behavioral Management Strategies: Medication therapy Major Change/Loss/Stressor/Fears (CP): Medical condition, self Behaviors When Feeling Stressed/Fearful: medication management Techniques to Cope with Loss/Stress/Change: Diversional activities Quality of Family Relationships: supportive, helpful, involved Do you feel physically threatened by others?: No    05/27/2024    PHQ2-9 Depression Screening   Little interest or pleasure in doing things    Feeling down, depressed, or hopeless    PHQ-2 - Total Score    Trouble falling or staying asleep, or sleeping too much    Feeling tired or having little energy    Poor appetite or overeating     Feeling bad about yourself - or that you are a failure or have let yourself or  your family down    Trouble concentrating on things, such as reading the newspaper or watching television    Moving or  speaking so slowly that other people could have noticed.  Or the opposite - being so fidgety or restless that you have been moving around a lot more than usual    Thoughts that you would be better off dead, or hurting yourself in some way    PHQ2-9 Total Score    If you checked off any problems, how difficult have these problems made it for you to do your work, take care of things at home, or get along with other people    Depression Interventions/Treatment      There were no vitals filed for this visit.  Medications Reviewed Today     Reviewed by Ermalinda Lenn HERO, LCSW (Social Worker) on 05/27/24 at 1134  Med List Status: <None>   Medication Order Taking? Sig Documenting Provider Last Dose Status Informant  acetaminophen  (TYLENOL ) 500 MG tablet [id number] Yes Take 1,000 mg by mouth every 6 (six) hours as needed for moderate pain (pain score 4-6). [provider]  Active Self  albuterol  (VENTOLIN  HFA) 108 (90 Base) MCG/ACT inhaler [id number] Yes Inhale 2 puffs into the lungs. Take two (2) puffs by mouth every 4 to 6 hours as needed for wheezing or shortness of breath [provider]  Active Self           Med Note GAYLENE DARVIN JINNY Austin May 03, 2024 10:24 PM)    SONJIA JESSE SCHLOSSMAN MEDICATION [id number] Yes Knee-high, medium compression, graduated compression stockings. Apply to lower extremities. [email or web address], Zippered Compression Stockings, medium circ, long length [name], Susan K, MD  Active Self  apixaban  (ELIQUIS ) 5 MG TABS tablet [id number] Yes Take 1 tablet (5 mg total) by mouth 2 (two) times daily. [name], Susan K, MD  Active Self  bisacodyl  (DULCOLAX) 5 MG EC tablet [id number] Yes Take 1 tablet (5 mg total) by mouth daily as needed for moderate constipation. Maree Hue, MD  Active Self           Med Note (BEJOS, ANJA J   Sun May 03, 2024  10:26 PM)    carbamazepine  (TEGRETOL  XR) 100 MG 12 hr tablet [id number] Yes Take 1 tablet (100 mg total) by mouth 2 (two) times daily. Wouk, Devaughn Sayres, MD  Active Self  Cholecalciferol  (VITAMIN D -3 PO) [id number] Yes Take 1 capsule by mouth every morning. [provider]  Active Self  collagenase  (SANTYL ) 250 UNIT/GM ointment [id number] Yes Apply 1 Application topically daily. [name], Susan K, MD  Active Self  DULoxetine  (CYMBALTA ) 60 MG capsule [id number] Yes Take 1 capsule (60 mg total) by mouth daily. [name], Susan K, MD  Active Self  feeding supplement (ENSURE PLUS HIGH PROTEIN) LIQD [id number] Yes Take 237 mLs by mouth 3 (three) times daily between meals. Von Bellis, MD  Active Self  folic acid  (FOLVITE ) 1 MG tablet [id number] Yes Take 1 tablet (1 mg total) by mouth daily. Von Bellis, MD  Active   gabapentin  (NEURONTIN ) 300 MG capsule [id number] Yes Take 1 capsule (300 mg total) by mouth 4 (four) times daily.  Patient taking differently: Take 300 mg by mouth 4 (four) times daily as needed.   Maree Hue, MD  Active Self  lamoTRIgine  (LAMICTAL ) 25 MG tablet [id number] Yes Take 2 tablets (50 mg total) by mouth 2 (two) times daily. [name], Susan K, MD  Active   levETIRAcetam  (KEPPRA ) 750 MG tablet [id number] Yes Take 2 tablets (1,500 mg total) by mouth 2 (two) times daily. Wouk, Devaughn Sayres, MD  Active Self  Melatonin 10  MG TABS [id number] Yes Take 10 mg by mouth at bedtime. [provider]  Active Self  midodrine  (PROAMATINE ) 5 MG tablet [id number] Yes Take 5 mg by mouth 3 (three) times daily with meals.  Patient taking differently: Take 5 mg by mouth 3 (three) times daily with meals. 05/11/24: Caregiver/ mother reports patient takes prn: TID for SBP < 100   [provider]  Active   mirtazapine  (REMERON ) 7.5 MG tablet [id number] Yes Take 1 tablet (7.5 mg total) by mouth at bedtime. [name], Susan K, MD  Active   morphine  (MSIR) 15 MG tablet [id number] Yes Take 1 tablet (15  mg total) by mouth every 8 (eight) hours as needed for severe pain (pain score 7-10). Jens Durand, MD  Active Self  JESSE SCHLOSSMAN [id number] Yes Apply 1 Application topically 3 (three) times daily as needed. Rock Salt Cream (Similar to [employer]) [provider]  Active Self  ondansetron  (ZOFRAN ) 4 MG tablet [id number] Yes Take 1 tablet (4 mg total) by mouth every 8 (eight) hours as needed for nausea or vomiting. [name], Susan K, MD  Active Self  pantoprazole  (PROTONIX ) 40 MG tablet [id number] Yes Take 1 tablet (40 mg total) by mouth daily. [name], Susan K, MD  Active Self  potassium chloride  SA (KLOR-CON  M) 20 MEQ tablet [id number] Yes TAKE 1 TABLET BY MOUTH EVERY DAY [name], Susan K, MD  Active Self  torsemide  (DEMADEX ) 20 MG tablet [id number] Yes Take 1 tablet (20 mg total) by mouth daily. [name], Devere POUR, MD  Active             Recommendation:   PCP Follow-up Specialty provider follow-up Dr. Tobie 05/28/24, Pain Management 06/18/24 [facility] 06/18/24 [facility] 06/29/24  Follow Up Plan:   Telephone follow up appointment date/time:  06/17/24  Lenn Mean, LCSW [place]  [facility], [facility] Licensed Clinical Social Worker  Direct Dial: [phone]

## 2024-05-27 NOTE — Patient Instructions (Signed)
 Visit Information  Thank you for taking time to visit with me today. Please don't hesitate to contact me if I can be of assistance to you before our next scheduled appointment.  Your next care management appointment is by telephone on 06/17/24 at 11am.    Please call the care guide team at [phone] if you need to cancel, schedule, or reschedule an appointment.   Please call the Suicide and Crisis Lifeline: 988 call the [place]  National Suicide Prevention Lifeline: 915-053-1722 or TTY: (618) 473-9719 TTY 832-866-3578) to talk to a trained counselor call 1-800-273-TALK (toll free, 24 hour hotline) call 911 if you are experiencing a Mental Health or Behavioral Health Crisis or need someone to talk to.  Shigeo Baugh, LCSW [place]  [facility], [facility] Licensed Clinical Social Worker  Direct Dial: [phone]

## 2024-05-27 NOTE — Patient Instructions (Signed)
 Visit Information  Thank you for taking time to visit with me today. Please don't hesitate to contact me if I can be of assistance to you before our next scheduled telephone appointment.  Our next appointment is by telephone on Thursday June 04, 2024 at 2:00 pm  Please call the care guide team at [phone] if you need to cancel or reschedule your appointment.   Following are the goals we discussed today:  Patient Self Care Activities:  Attend all scheduled provider appointments Call provider office for new concerns or questions  Participate in Transition of Care Program/Attend TOC scheduled calls Take medications as prescribed   Use assistive devices as needed to prevent falls- your walker Continue to weigh yourself every day to stay on top of early fluid retention: write down your weights every day so you remember what it is from day to day: follow the weight-gain guidelines and action plan to call your doctor if you gain more than 3 lbs overnight, or 5 lbs in one week If you believe your condition is getting worse- contact your care providers (doctors) promptly- reaching out to your doctor early when you have concerns can prevent you from having to go to the hospital  If you are experiencing a Mental Health or Behavioral Health Crisis or need someone to talk to, please  call the Suicide and Crisis Lifeline: 988 call the [place]  National Suicide Prevention Lifeline: 805 470 6210 or TTY: 986-559-2527 TTY 339-491-9924) to talk to a trained counselor call 1-800-273-TALK (toll free, 24 hour hotline) go to [facility] [address], [place] [phone]) call the [facility] Crisis Line: ([phone] call 911   Patient verbalizes understanding of instructions and care plan provided today and agrees to view in MyChart. Active MyChart status and patient understanding of how to access instructions and care plan via MyChart confirmed with  patient.     Jonita Hirota Mckinney Decie Verne, RN, BSN, Media planner  Transitions of Care  [facility] [phone]: direct office

## 2024-05-27 NOTE — Telephone Encounter (Signed)
 Copied from CRM #[id number]. Topic: Clinical - Medical Advice >> May 27, 2024  1:11 PM Pinkey ORN wrote: Reason for CRM: Medical Advice >> May 27, 2024  1:12 PM Pinkey ORN wrote: Skippy RN [facility] [phone]  Called on behalf of patient, states patient leegs are swollen and patient is complaining of them weeping again. Wants to know if [name], Susan K, MD wants her to start wrapping them again. Please follow up.

## 2024-05-27 NOTE — Transitions of Care (Post Inpatient/ED Visit) (Signed)
 Transition of Care week 3/ day # 16  Visit Note  05/27/2024  Name: [facility]ail [name] [name] MRN: [id number]          DOB: 07-21-1966  Situation: Patient enrolled in [name] 30-day program. Visit completed with patient and mother- caregiver by telephone while phone on speaker mode  HIPAA identifiers x 2 verified  Background:  Functional/Safety concern: multiple falls at home: some with injury/ some without: EMS vs. Fire department has to be contacted to get patient up at home when she falls: caregiver unable to do so Medication management barrier: caregiver manages all aspects of patient medications: will benefit from ongoing support/ pharmacy referral as needed; polypharmacy 05/27/24: confirmed patient has spoken with/ established with pharmacy for medication management per review of EHR Recent hospitalization September 14 - 17, 2025 for LE swelling/ MRSA - positive cellulitis/ hypotension/ CHF exacerbation:  o  caregiver reports patient adamantly declined SNF placement for short-term rehabilitation: caregiver- mother is elderly and verbalises concerns around her ability to care for patient Fragile state of health, multiple progressing chronic health conditions Ongoing tobacco use: caregiver reports patient will not stop/ not ready Multiple recent hospitalizations: 9- plus x last 12 months  Initial Transition Care Management Follow-up Telephone Call    Past Medical History:  Diagnosis Date   Anxiety    Arthritis    Back pain    Basal cell carcinoma 10/04/2021   R axilla - needs excised 11/28/21   Basal cell carcinoma 10/04/2021   L antecubital excised 11/14/21   Diarrhea 11/12/2016   Fibromyalgia    Generalized abdominal pain 11/12/2016   H. pylori infection    Hyperlipidemia    IBS (irritable bowel syndrome)    Infectious colitis 04/29/2016   Migraines    Moderate dehydration 04/29/2016   Muscle pain    Opioid overdose (HCC) 12/07/2022   Reflux    Seizures (HCC)     Unexplained weight loss 11/12/2016   Assessment:  with patient and caregiver while phone on speaker mode: Doing okay- been a very busy week; we saw Abigail [name] yesterday and she told us  to keep using the compression stockings.  The home health nurse called out to Abigail [name] office today when she came, and asked if she should start wrapping her legs again- so we are waiting to hear back about that; Abigail [name] did not say anything about it yesterday, but the nurse thought she should call.  Tolerating the PT fine.  Talking to the social worker now, she is helping us  with resources.  No new falls, still using walker    Denies clinical concerns and sounds to be in no distress throughout Abigail [name] 30-day program outreach call today  Patient Reported Symptoms: Cognitive Cognitive Status: Normal speech and language skills, Alert and oriented to person, place, and time, Insightful and able to interpret abstract concepts, Requires Assistance Decision Making, Difficulties with attention and concentration (mother continues to manage most aspects of medical affairs) Cognitive/Intellectual Conditions Management [RPT]: None reported or documented in medical history or problem list      Neurological Neurological Review of Symptoms: No symptoms reported Neurological Management Strategies: Routine screening, Medication therapy, Coping strategies, Adequate rest  HEENT HEENT Symptoms Reported: No symptoms reported      Cardiovascular Cardiovascular Symptoms Reported: Swelling in legs or feet Other Cardiovascular Symptoms: Continues to wear compression stockings as instructed by PCP/ care providers: confirmed attended PCP office visit 05/26/24: confirmed home health nurse put call into PCP today to clarify whether  home health team should be wrapping  legs: mother reports that they have started draining again; which PCP is aware of per 05/26/24 office visit notes: advised mother and patient to continue compression  stockings as per 05/26/24 office visit-- until PCP clarifys with home health nurse after her call today; confirmed continues to monitor blood pressures at home several times per day: reports continues to take midodrine  for SBP < 100; has not needed to take today; confirms does need to use several times per week at baseline Does patient have uncontrolled Hypertension?: No Cardiovascular Management Strategies: Routine screening, Coping strategies, Adequate rest, Medication therapy  Respiratory Respiratory Symptoms Reported: No symptoms reported Other Respiratory Symptoms: Denies shortness of breath; sounds to be in no respiratory distress throughout [facility] call; confirms continues to smoke: not interested in stopping Respiratory Management Strategies: Coping strategies, Adequate rest, Asthma action plan, Medication therapy  Endocrine Endocrine Symptoms Reported: No symptoms reported Is patient diabetic?: No    Gastrointestinal Gastrointestinal Symptoms Reported: No symptoms reported      Genitourinary Genitourinary Symptoms Reported: Incontinence Genitourinary Management Strategies: Incontinence garment/pad, Coping strategies  Integumentary Integumentary Symptoms Reported: Skin changes Additional Integumentary Details: Reports legs have started to drain again; as per CV assessment: verified PCP aware from review of PCP office notes 05/26/24: home health nurse has placed call to PCP to verify whether she should resume leg wrapping at home: for now patient confirms she is wearing compression stockings as instructed by PCP yesterday on 05/26/24 Skin Management Strategies: Routine screening, Coping strategies, Adequate rest  Musculoskeletal Musculoskelatal Symptoms Reviewed: Limited mobility Additional Musculoskeletal Details: confirmed uses assistive devices on regular basis, at baseline -- walker; confirmed no new falls since last TOC call Musculoskeletal Management Strategies: Routine screening, Coping  strategies, Adequate rest, Medical device      Psychosocial Psychosocial Symptoms Reported: Not assessed (confirmed assessed by [facility] LCSW earlier today)         There were no vitals filed for this visit.  Medications Reviewed Today     Reviewed by Taji Sather M, RN (Registered Nurse) on 05/27/24 at 1401  Med List Status: <None>   Medication Order Taking? Sig Documenting Provider Last Dose Status Informant  acetaminophen  (TYLENOL ) 500 MG tablet [id number]  Take 1,000 mg by mouth every 6 (six) hours as needed for moderate pain (pain score 4-6). [provider]  Active Self  albuterol  (VENTOLIN  HFA) 108 (90 Base) MCG/ACT inhaler [id number]  Inhale 2 puffs into the lungs. Take two (2) puffs by mouth every 4 to 6 hours as needed for wheezing or shortness of breath [provider]  Active Self           Med Note GAYLENE DARVIN JINNY Austin May 03, 2024 10:24 PM)    AMBULATORY NON FORMULARY MEDICATION [id number]  Knee-high, medium compression, graduated compression stockings. Apply to lower extremities. [email or web address], Zippered Compression Stockings, medium circ, long length [name], Susan K, MD  Active Self  apixaban  (ELIQUIS ) 5 MG TABS tablet [id number]  Take 1 tablet (5 mg total) by mouth 2 (two) times daily. [name], Susan K, MD  Active Self  bisacodyl  (DULCOLAX) 5 MG EC tablet [id number]  Take 1 tablet (5 mg total) by mouth daily as needed for moderate constipation. Maree Hue, MD  Active Self           Med Note (BEJOS, ANJA J   Sun May 03, 2024 10:26 PM)    carbamazepine  (TEGRETOL  XR) 100 MG 12 hr tablet [id number]  Take  1 tablet (100 mg total) by mouth 2 (two) times daily. Wouk, Devaughn Sayres, MD  Active Self  Cholecalciferol  (VITAMIN D -3 PO) [id number]  Take 1 capsule by mouth every morning. [provider]  Active Self  collagenase  (SANTYL ) 250 UNIT/GM ointment [id number]  Apply 1 Application topically daily. [name], Susan K, MD  Active Self  DULoxetine  (CYMBALTA ) 60  MG capsule [id number]  Take 1 capsule (60 mg total) by mouth daily. [name], Susan K, MD  Active Self  feeding supplement (ENSURE PLUS HIGH PROTEIN) LIQD [id number]  Take 237 mLs by mouth 3 (three) times daily between meals. Von Bellis, MD  Active Self  folic acid  (FOLVITE ) 1 MG tablet [id number]  Take 1 tablet (1 mg total) by mouth daily. Von Bellis, MD  Active   gabapentin  (NEURONTIN ) 300 MG capsule [id number]  Take 1 capsule (300 mg total) by mouth 4 (four) times daily.  Patient taking differently: Take 300 mg by mouth 4 (four) times daily as needed.   Maree Hue, MD  Active Self  lamoTRIgine  (LAMICTAL ) 25 MG tablet [id number]  Take 2 tablets (50 mg total) by mouth 2 (two) times daily. [name], Susan K, MD  Active   levETIRAcetam  (KEPPRA ) 750 MG tablet [id number]  Take 2 tablets (1,500 mg total) by mouth 2 (two) times daily. Wouk, Devaughn Sayres, MD  Active Self  Melatonin 10 MG TABS [id number]  Take 10 mg by mouth at bedtime. [provider]  Active Self  midodrine  (PROAMATINE ) 5 MG tablet [id number]  Take 5 mg by mouth 3 (three) times daily with meals.  Patient taking differently: Take 5 mg by mouth 3 (three) times daily with meals. 05/11/24: Caregiver/ mother reports patient takes prn: TID for SBP < 100   [provider]  Active   mirtazapine  (REMERON ) 7.5 MG tablet [id number]  Take 1 tablet (7.5 mg total) by mouth at bedtime. [name], Susan K, MD  Active   morphine  (MSIR) 15 MG tablet [id number]  Take 1 tablet (15 mg total) by mouth every 8 (eight) hours as needed for severe pain (pain score 7-10). Jens Durand, MD  Active Self  JESSE SCHLOSSMAN [id number]  Apply 1 Application topically 3 (three) times daily as needed. Rock [employer] Cream (Similar to [employer]) [provider]  Active Self  ondansetron  (ZOFRAN ) 4 MG tablet [id number]  Take 1 tablet (4 mg total) by mouth every 8 (eight) hours as needed for nausea or vomiting. [name], Susan K, MD  Active Self  pantoprazole  (PROTONIX )  40 MG tablet [id number]  Take 1 tablet (40 mg total) by mouth daily. [name], Susan K, MD  Active Self  potassium chloride  SA (KLOR-CON  M) 20 MEQ tablet [id number]  TAKE 1 TABLET BY MOUTH EVERY DAY [name], Susan K, MD  Active Self  torsemide  (DEMADEX ) 20 MG tablet [id number]  Take 1 tablet (20 mg total) by mouth daily. [name], Susan K, MD  Active            Recommendation:   Specialty provider follow-up: 05/28/24- Rheumatology provider; 06/18/24- pain management provider Continue Current Plan of Care Continue communicating regularly as scheduled with the social worker who is now involved in your care  Follow Up Plan:   Telephone follow-up in 1 week- as scheduled 06/04/24  Pls call/ message for questions,  Beatris Blinda Lawrence, RN, BSN, CCRN Alumnus RN Care Manager  Transitions of Care  [name] [phone]: direct office

## 2024-05-28 DIAGNOSIS — M810 Age-related osteoporosis without current pathological fracture: Secondary | ICD-10-CM | POA: Diagnosis not present

## 2024-05-28 DIAGNOSIS — M316 Other giant cell arteritis: Secondary | ICD-10-CM | POA: Diagnosis not present

## 2024-05-28 DIAGNOSIS — M0609 Rheumatoid arthritis without rheumatoid factor, multiple sites: Secondary | ICD-10-CM | POA: Diagnosis not present

## 2024-05-28 DIAGNOSIS — Z7960 Long term (current) use of unspecified immunomodulators and immunosuppressants: Secondary | ICD-10-CM | POA: Diagnosis not present

## 2024-05-28 DIAGNOSIS — M51362 Other intervertebral disc degeneration, lumbar region with discogenic back pain and lower extremity pain: Secondary | ICD-10-CM | POA: Diagnosis not present

## 2024-05-28 DIAGNOSIS — Z111 Encounter for screening for respiratory tuberculosis: Secondary | ICD-10-CM | POA: Diagnosis not present

## 2024-05-29 ENCOUNTER — Telehealth: Payer: Self-pay | Admitting: Family Medicine

## 2024-05-29 DIAGNOSIS — J449 Chronic obstructive pulmonary disease, unspecified: Secondary | ICD-10-CM | POA: Diagnosis not present

## 2024-05-29 DIAGNOSIS — F419 Anxiety disorder, unspecified: Secondary | ICD-10-CM | POA: Diagnosis not present

## 2024-05-29 DIAGNOSIS — L03119 Cellulitis of unspecified part of limb: Secondary | ICD-10-CM | POA: Diagnosis not present

## 2024-05-29 DIAGNOSIS — E538 Deficiency of other specified B group vitamins: Secondary | ICD-10-CM | POA: Diagnosis not present

## 2024-05-29 DIAGNOSIS — G43909 Migraine, unspecified, not intractable, without status migrainosus: Secondary | ICD-10-CM | POA: Diagnosis not present

## 2024-05-29 DIAGNOSIS — E876 Hypokalemia: Secondary | ICD-10-CM | POA: Diagnosis not present

## 2024-05-29 DIAGNOSIS — I5030 Unspecified diastolic (congestive) heart failure: Secondary | ICD-10-CM | POA: Diagnosis not present

## 2024-05-29 DIAGNOSIS — F32A Depression, unspecified: Secondary | ICD-10-CM | POA: Diagnosis not present

## 2024-05-29 DIAGNOSIS — I872 Venous insufficiency (chronic) (peripheral): Secondary | ICD-10-CM | POA: Diagnosis not present

## 2024-05-29 NOTE — Telephone Encounter (Unsigned)
 Copied from CRM #[id number]. Topic: Clinical - Medical Advice >> May 29, 2024  1:06 PM [name]: Reason for CRM: [name] from [facility] home health called stating that the patient has gained 8 pounds- weighted 166 today and was 158 on Wednesday     She is asking for a call back at [phone] to discuss torsemide  (DEMADEX ) 20 MG tablet States that patient will not go to the hospital

## 2024-05-29 NOTE — Telephone Encounter (Signed)
 Copied from CRM #[id number]. Topic: Clinical - Medical Advice >> May 27, 2024  1:11 PM Pinkey ORN wrote: Reason for CRM: Medical Advice >> May 29, 2024 12:32 PM Winona SAUNDERS wrote: Shelbie from [facility] home health returning call to find out what she should do about the pt legs, I read to her Dr. Deena notes as no one has called her back. She understood and pt stated she is still taking her lasix  and is not having any issues with her BP. Please inform the pt is she should not be taking the Lasix  med

## 2024-06-01 ENCOUNTER — Telehealth: Payer: Self-pay | Admitting: Family Medicine

## 2024-06-01 DIAGNOSIS — E876 Hypokalemia: Secondary | ICD-10-CM | POA: Diagnosis not present

## 2024-06-01 DIAGNOSIS — J449 Chronic obstructive pulmonary disease, unspecified: Secondary | ICD-10-CM | POA: Diagnosis not present

## 2024-06-01 DIAGNOSIS — G43909 Migraine, unspecified, not intractable, without status migrainosus: Secondary | ICD-10-CM | POA: Diagnosis not present

## 2024-06-01 DIAGNOSIS — L03119 Cellulitis of unspecified part of limb: Secondary | ICD-10-CM | POA: Diagnosis not present

## 2024-06-01 DIAGNOSIS — F419 Anxiety disorder, unspecified: Secondary | ICD-10-CM | POA: Diagnosis not present

## 2024-06-01 DIAGNOSIS — E538 Deficiency of other specified B group vitamins: Secondary | ICD-10-CM | POA: Diagnosis not present

## 2024-06-01 DIAGNOSIS — I5030 Unspecified diastolic (congestive) heart failure: Secondary | ICD-10-CM | POA: Diagnosis not present

## 2024-06-01 DIAGNOSIS — F32A Depression, unspecified: Secondary | ICD-10-CM | POA: Diagnosis not present

## 2024-06-01 DIAGNOSIS — I872 Venous insufficiency (chronic) (peripheral): Secondary | ICD-10-CM | POA: Diagnosis not present

## 2024-06-01 NOTE — Telephone Encounter (Signed)
 Abigail [name] has gained 8 pounds in the last week.  she has Unna boots to the knee bilaterally and her Unna boots hurt.  She has not been propping her feet up.  She has gained 8 pounds since Wednesday her thighs are swollen and sore knees.  She has been taking Demadex  20 mg 1 a day along with potassium chloride  20 mEq daily advise she can increase to 40 of Demadex  daily and 40 of potassium chloride  daily for 3 days and lets see if that has any effect.  Home health reports that she has been eating a lot of salty foods and drinking sodas.  Sounds like she is getting salt in her diet.  She is got midodrine  if her systolic blood pressure gets below 100 she can take 1 of these.  Follow-up on Thursday.

## 2024-06-02 DIAGNOSIS — E538 Deficiency of other specified B group vitamins: Secondary | ICD-10-CM | POA: Diagnosis not present

## 2024-06-02 DIAGNOSIS — F32A Depression, unspecified: Secondary | ICD-10-CM | POA: Diagnosis not present

## 2024-06-02 DIAGNOSIS — E876 Hypokalemia: Secondary | ICD-10-CM | POA: Diagnosis not present

## 2024-06-02 DIAGNOSIS — F419 Anxiety disorder, unspecified: Secondary | ICD-10-CM | POA: Diagnosis not present

## 2024-06-02 DIAGNOSIS — I5030 Unspecified diastolic (congestive) heart failure: Secondary | ICD-10-CM | POA: Diagnosis not present

## 2024-06-02 DIAGNOSIS — L03119 Cellulitis of unspecified part of limb: Secondary | ICD-10-CM | POA: Diagnosis not present

## 2024-06-02 DIAGNOSIS — I872 Venous insufficiency (chronic) (peripheral): Secondary | ICD-10-CM | POA: Diagnosis not present

## 2024-06-02 DIAGNOSIS — G43909 Migraine, unspecified, not intractable, without status migrainosus: Secondary | ICD-10-CM | POA: Diagnosis not present

## 2024-06-02 DIAGNOSIS — J449 Chronic obstructive pulmonary disease, unspecified: Secondary | ICD-10-CM | POA: Diagnosis not present

## 2024-06-04 ENCOUNTER — Telehealth: Payer: Self-pay | Admitting: Family Medicine

## 2024-06-04 ENCOUNTER — Other Ambulatory Visit: Payer: Self-pay | Admitting: *Deleted

## 2024-06-04 DIAGNOSIS — J449 Chronic obstructive pulmonary disease, unspecified: Secondary | ICD-10-CM

## 2024-06-04 DIAGNOSIS — R296 Repeated falls: Secondary | ICD-10-CM

## 2024-06-04 DIAGNOSIS — F17200 Nicotine dependence, unspecified, uncomplicated: Secondary | ICD-10-CM

## 2024-06-04 DIAGNOSIS — E8809 Other disorders of plasma-protein metabolism, not elsewhere classified: Secondary | ICD-10-CM

## 2024-06-04 DIAGNOSIS — Z79891 Long term (current) use of opiate analgesic: Secondary | ICD-10-CM

## 2024-06-04 DIAGNOSIS — J9611 Chronic respiratory failure with hypoxia: Secondary | ICD-10-CM

## 2024-06-04 DIAGNOSIS — F418 Other specified anxiety disorders: Secondary | ICD-10-CM

## 2024-06-04 DIAGNOSIS — I872 Venous insufficiency (chronic) (peripheral): Secondary | ICD-10-CM | POA: Diagnosis not present

## 2024-06-04 DIAGNOSIS — L03119 Cellulitis of unspecified part of limb: Secondary | ICD-10-CM

## 2024-06-04 NOTE — Telephone Encounter (Signed)
 Copied from CRM [id number]. Topic: General - Other >> Jun 04, 2024  1:24 PM Antony RAMAN wrote: Reason for CRM: Lorenza from [facility] home health calling to update Dr [name]. Weight gain and edma is the same no change. Please advise how to proceed [phone]    Dr [name] called and verified no SOB or other symptoms. She changed mediation doses and will follow up wit patient.

## 2024-06-04 NOTE — Patient Instructions (Signed)
 Visit Information  Thank you for taking time to visit with me today. Please don't hesitate to contact me if I can be of assistance to you before our next scheduled telephone appointment.  It has been a pleasure working with you since you got out of the hospital recently;  Please listen for a call from the scheduling care guide to schedule a phone call with the new nurse care manager who will pick up in your care where we are leaving off today  Following are the goals we discussed today:  Patient Self Care Activities:  Attend all scheduled provider appointments Call provider office for new concerns or questions  Take medications as prescribed   Use assistive devices as needed to prevent falls- your walker Continue to weigh yourself every day to stay on top of early fluid retention: write down your weights every day so you remember what it is from day to day: follow the weight-gain guidelines and action plan to call your doctor if you gain more than 3 lbs overnight, or 5 lbs in one week If you believe your condition is getting worse- contact your care providers (doctors) promptly- reaching out to your doctor early when you have concerns can prevent you from having to go to the hospital  If you are experiencing a Mental Health or Behavioral Health Crisis or need someone to talk to, please  call the Suicide and Crisis Lifeline: 988 call the [place]  National Suicide Prevention Lifeline: 512 562 7512 or TTY: (279)787-9352 TTY (720)156-9228) to talk to a trained counselor call 1-800-273-TALK (toll free, 24 hour hotline) go to [facility] [address], [place] [phone]) call the [facility] Crisis Line: [phone] call 911   Patient caregiver verbalizes understanding of instructions and care plan provided today and agrees to view in MyChart. Active MyChart status and patient understanding of how to access instructions and care plan via MyChart  confirmed with patient.     Pls call/ message for questions,  Jshaun Abernathy Mckinney Kasidy Gianino, RN, BSN, CCRN Alumnus RN Care Manager  Transitions of Care  [facility] - [facility] [phone]: direct office

## 2024-06-04 NOTE — Telephone Encounter (Signed)
 She denies any SOB.  She does not have to prop up to breath and she denies PND.  She has taken torsemide  20mg  twice a day for three days and her weight is stable at 168.  She weighed 168 yesterday as well but she was 166 3 days ago.  Continue to double up on torsemide  20mg  and KCL 20meq for three more days and let's see what you weigh on Monday.

## 2024-06-04 NOTE — Transitions of Care (Post Inpatient/ED Visit) (Signed)
 Transition of Care week 4/ day # 23 TOC 30-day program case closure per caregiver request  Visit Note  06/04/2024  Name: Abigail [name] [facility] MRN: [id number]          DOB: Apr 04, 1966  Situation: Patient enrolled in [facility] 30-day program. Visit completed with patient's mother- caregiver by telephone.   HIPAA identifiers x 2 verified  Background:  Functional/Safety concern: multiple falls at home: some with injury/ some without: EMS vs. Fire department has to be contacted to get patient up at home when she falls: caregiver unable to do so Medication management barrier: caregiver manages all aspects of patient medications: will benefit from ongoing support/ pharmacy intervention- currently in place for polypharmacy Recent hospitalization September 14 - 17, 2025 for LE swelling/ MRSA - positive cellulitis/ hypotension/ CHF exacerbation: caregiver reports patient adamantly declined SNF placement for short-term rehabilitation: caregiver- mother is elderly and verbalises concerns around her ability to care for patient Fragile state of health, multiple progressing chronic health conditions Ongoing tobacco use and dietary non-adherence: caregiver reports patient will not stop smoking/ not ready; states patient has always done whatever she wants to do whether it is good for her not; I can't make her do anything Multiple recent hospitalizations: 9- plus x last 12 months  Initial Transition Care Management Follow-up Telephone Call Discharge Date and Diagnosis: 05/06/24, Lower extremity swelling- cellulitis: MRSA positive; hypotension/ weakness   Past Medical History:  Diagnosis Date   Anxiety    Arthritis    Back pain    Basal cell carcinoma 10/04/2021   R axilla - needs excised 11/28/21   Basal cell carcinoma 10/04/2021   L antecubital excised 11/14/21   Diarrhea 11/12/2016   Fibromyalgia    Generalized abdominal pain 11/12/2016   H. pylori infection    Hyperlipidemia    IBS  (irritable bowel syndrome)    Infectious colitis 04/29/2016   Migraines    Moderate dehydration 04/29/2016   Muscle pain    Opioid overdose (HCC) 12/07/2022   Reflux    Seizures (HCC)    Unexplained weight loss 11/12/2016   Assessment:  Per mother/ caregiver while phone on speaker mode- patient does not participate in call today: Well, the home health nurse Abigail [name] just left and she said she is very concerned about Abigail [name]'s condition: said she has gained a lot of weight- I don't know exactly how much, but said that her legs are swollen more too.  The nurse said she was calling Dr. [name] to report all of this--- they did start wrapping her legs in the special wraps again- but Abigail [name] is not doing what she is supposed to-- she is eating all of this salty food and drinking a LOT of soda's and still smoking all the time too.  Abigail [name] had a long talk with her but Abigail [name] doesn't listen- she just does what she wants to do and doesn't care.  I can't tell her what to do- she is almost 58 years old, and I am in my 58's and can barely even care for her, especially when I have to do everything and she keeps doing all this stuff that just sets her back.  I will surprised if she does end up in the hospital again, but I can't make her do anything.  If you are not going to be calling next week, I am not up to talking to another new nurse on the phone.  I have live in person nurses coming out to our home 2-3  times a week plus the PT and the OT and it's just too much on me, I am old and I have my own health issues.  Please just have another nurse give me a call in a couple of weeks, have them call me, not her- she will not answer her phone.  I know I can call Abigail [name] the home health nurse and she will be right here if I need her. I am sorry, I just can't take all these phone calls all the time, I was just getting used to talking to you, and I don't want anybody new who doesn't really know us  for a next week call, I am just too tired.   Abigail [name] is outside smoking right now because Abigail [name] just left the house    Caregiver reports  clinical concerns around patient condition as above and is crying on and off throughout [facility] call- she is clearly overwhelmed in trying to care for ongoing needs of non-adherent patient; per her request, TOC case closure today at time of week # 4 call with care coordination outreach to established [facility] LCSW and referral to longitudinal RN CM for ongoing caregiver support in caring for patient who remains non-adherent to plan of care Extensive emotional support provided to patient's caregiver throughout [facility] call: encouraged her ongoing engagement with [facility]I LCSW for caregiver support: she verbalizes agreement with same  Patient Reported Symptoms: Cognitive Cognitive Status: Normal speech and language skills, Difficulties with attention and concentration, Alert and oriented to person, place, and time, Poor judgment in daily scenarios, Requires Assistance Decision Making (per caregiver- mother- mother continues to manage all aspects of health care affairs) Cognitive/Intellectual Conditions Management [RPT]: Other Other: Ongoing non-adherence reported by patient's caregiver; history of seizure disorder      Neurological Neurological Review of Symptoms: Other: (per caregiver- mother) Oher Neurological Symptoms/Conditions [RPT]: history of seizure disorder: mother reports no seizure activity, but states she sometimes just sits and stares into space, that has been going on for a long time.  It doesn't look like the other seizures she has had, I don't know if it is a part of her mental problems Neurological Management Strategies: Routine screening, Medication therapy, Coping strategies, Adequate rest  HEENT HEENT Symptoms Reported: No symptoms reported (per caregiver- mother)      Cardiovascular Cardiovascular Symptoms Reported: Swelling in legs or feet (per caregiver- mother) Other Cardiovascular Symptoms: They did  start doing the leg wraps again by the home health nurse- she is coming out twice a week and applying these unna boots because Abigail [name]'s legs are just getting more and more swollen and they say she is gaining weight-- but Abigail [name] is not doing what she is supposed to: she is eating a lot of salty food and drinking a lot of soda, and still smoking like she always does.  The home health nurses have talked to her, but she won't quit- she does what she wants to do, and I nor anyone else can make her do anything different.  It wouldn't surprise me if she doesn't go back to the hospital before this is all over.  Her blood pressures are good today and the home health nurse called Dr. [name] today Does patient have uncontrolled Hypertension?: No Cardiovascular Management Strategies: Routine screening, Coping strategies, Adequate rest, Medication therapy Do You Have a Working Readable Scale?: Yes Weight:  (mother reports unable to remember wha today's weight was during home health RN visit: She just said she is still gaining weight)  Respiratory  Respiratory Symptoms Reported: No symptoms reported (per caregiver- mother) Other Respiratory Symptoms: Denies that patient is short of breath; she seems to be breathing fine to me, and the home health nurse wasn't concerned about her breathing-- just all of her leg swelling; but Dr. [name] said the leg swelling is not from heart failure- it is from lymphedema.  She has an appointment for that with the hematologist on Monday 06/08/24 if she is not back in the hospital Respiratory Management Strategies: Routine screening, Adequate rest, Asthma action plan, Medication therapy  Endocrine Endocrine Symptoms Reported: No symptoms reported (per caregiver- mother) Is patient diabetic?: No    Gastrointestinal Gastrointestinal Symptoms Reported: No symptoms reported (per caregiver- mother) Additional Gastrointestinal Details: She is eating a lot- things she should not be eating  like salty food and a lot of sodas- we tell her not to, but she is a grown woman, I can't make her do anything.  She is going to do whatever she wants to even if she knows it is bad for her; reports ongoing normal/ regular BM's      Genitourinary Genitourinary Symptoms Reported: Incontinence (as per baseline: per caregiver- mother) Additional Genitourinary Details: She is peeing fine and there is no cloudiness Genitourinary Management Strategies: Incontinence garment/pad, Coping strategies  Integumentary Integumentary Symptoms Reported: Skin changes (per caregiver- mother) Additional Integumentary Details: Her legs have just gotten worse and worse even though the home health nurses have started re-wrapping her legs.  They tell her what I do- not to eat everything she is eating but she does what she wants to do.  The home health nurse just left and she was concerned that her legs just keep looking worse, even thought they are doing everything they can to help it get better Skin Management Strategies: Routine screening, Dressing changes, Adequate rest  Musculoskeletal Musculoskelatal Symptoms Reviewed: Limited mobility (per caregiver- mother report) Additional Musculoskeletal Details: confirmed uses assistive devices on regular basis, at baseline -- walker; caregiver denies new/ recent falls since last [facility] outreach Musculoskeletal Management Strategies: Routine screening, Medical device, Coping strategies, Adequate rest   Fall risk Follow up: Falls prevention discussed, Education provided  Psychosocial Psychosocial Symptoms Reported: Alteration in eating habits, Difficulty concentrating, Flat affect, Other (Entirety of call today completed per caregiver- mother: confirmed patient plans to attend upcoming psychiatry provider appointment 06/29/24; mother reports I don't know what all is wrong with her mentally reports ongoing non-adherence) Behavioral Management Strategies: Support system,  Medication therapy Major Change/Loss/Stressor/Fears (CP): Denies (per caregiver- mother- she doesn't care if she gets sick again, I am the one who cares and who tries to do what I can, but she doesn't do what she should be doing and I can't make her) Quality of Family Relationships: supportive, involved, helpful   There were no vitals filed for this visit.  Medications Reviewed Today     Reviewed by Lasasha Brophy M, RN (Registered Nurse) on 06/04/24 at 1402  Med List Status: <None>   Medication Order Taking? Sig Documenting Provider Last Dose Status Informant  acetaminophen  (TYLENOL ) 500 MG tablet [id number]  Take 1,000 mg by mouth every 6 (six) hours as needed for moderate pain (pain score 4-6). [provider]  Active Self  albuterol  (VENTOLIN  HFA) 108 (90 Base) MCG/ACT inhaler [id number]  Inhale 2 puffs into the lungs. Take two (2) puffs by mouth every 4 to 6 hours as needed for wheezing or shortness of breath [provider]  Active Self  Med Note GAYLENE DARVIN JINNY Austin May 03, 2024 10:24 PM)    AMBULATORY NON FORMULARY MEDICATION [id number]  Knee-high, medium compression, graduated compression stockings. Apply to lower extremities. [email or web address], Zippered Compression Stockings, medium circ, long length [name], Susan K, MD  Active Self  apixaban  (ELIQUIS ) 5 MG TABS tablet [id number]  Take 1 tablet (5 mg total) by mouth 2 (two) times daily. [name], Susan K, MD  Active Self  bisacodyl  (DULCOLAX) 5 MG EC tablet [id number]  Take 1 tablet (5 mg total) by mouth daily as needed for moderate constipation. Maree Hue, MD  Active Self           Med Note GAYLENE DARVIN JINNY Austin May 03, 2024 10:26 PM)    carbamazepine  (TEGRETOL  XR) 100 MG 12 hr tablet [id number]  Take 1 tablet (100 mg total) by mouth 2 (two) times daily. Wouk, Devaughn Sayres, MD  Active Self  Cholecalciferol  (VITAMIN D -3 PO) [id number]  Take 1 capsule by mouth every morning. [provider]  Active Self   collagenase  (SANTYL ) 250 UNIT/GM ointment [id number]  Apply 1 Application topically daily. [name], Susan K, MD  Active Self  DULoxetine  (CYMBALTA ) 60 MG capsule [id number]  Take 1 capsule (60 mg total) by mouth daily. [name], Susan K, MD  Active Self  feeding supplement (ENSURE PLUS HIGH PROTEIN) LIQD [id number]  Take 237 mLs by mouth 3 (three) times daily between meals. Von Bellis, MD  Active Self  folic acid  (FOLVITE ) 1 MG tablet [id number]  Take 1 tablet (1 mg total) by mouth daily. Von Bellis, MD  Active   gabapentin  (NEURONTIN ) 300 MG capsule [id number]  Take 1 capsule (300 mg total) by mouth 4 (four) times daily.  Patient taking differently: Take 300 mg by mouth 4 (four) times daily as needed.   Maree Hue, MD  Active Self  lamoTRIgine  (LAMICTAL ) 25 MG tablet [id number]  TAKE 2 TABLETS BY MOUTH 2 TIMES DAILY. [name], Susan K, MD  Active   levETIRAcetam  (KEPPRA ) 750 MG tablet [id number]  Take 2 tablets (1,500 mg total) by mouth 2 (two) times daily. Wouk, Devaughn Sayres, MD  Active Self  Melatonin 10 MG TABS [id number]  Take 10 mg by mouth at bedtime. [provider]  Active Self  midodrine  (PROAMATINE ) 5 MG tablet [id number]  Take 5 mg by mouth 3 (three) times daily with meals.  Patient taking differently: Take 5 mg by mouth 3 (three) times daily with meals. 05/11/24: Caregiver/ mother reports patient takes prn: TID for SBP < 100   [provider]  Active   mirtazapine  (REMERON ) 7.5 MG tablet [id number]  Take 1 tablet (7.5 mg total) by mouth at bedtime. [name], Susan K, MD  Active   morphine  (MSIR) 15 MG tablet [id number]  Take 1 tablet (15 mg total) by mouth every 8 (eight) hours as needed for severe pain (pain score 7-10). Jens Durand, MD  Active Self  JESSE SCHLOSSMAN [id number]  Apply 1 Application topically 3 (three) times daily as needed. Rock Salt Cream (Similar to [employer]) [provider]  Active Self  ondansetron  (ZOFRAN ) 4 MG tablet [id number]  Take 1 tablet (4 mg  total) by mouth every 8 (eight) hours as needed for nausea or vomiting. [name], Susan K, MD  Active Self  pantoprazole  (PROTONIX ) 40 MG tablet [id number]  Take 1 tablet (40 mg total) by mouth daily. [name], Susan K, MD  Active Self  potassium chloride  SA (KLOR-CON  M) 20 MEQ tablet [id number]  TAKE 1  TABLET BY MOUTH EVERY DAY [name], Susan K, MD  Active Self  torsemide  (DEMADEX ) 20 MG tablet [id number]  Take 1 tablet (20 mg total) by mouth daily. [name], Susan K, MD  Active            Recommendation:   Specialty provider follow-up: 06/08/24: hematology new patient provider office visit: for work up for lymphedema; 06/18/24: pain management provider Continue Current Plan of Care  Follow Up Plan:   Referral to RN Case Manager; [facility] LCSW already established in care Closing From:  Transitions of Care Program  Pls call/ message for questions,  Avea Mcgowen Mckinney Derell Bruun, RN, BSN, CCRN Alumnus RN Care Manager  Transitions of Care  [facility] - [facility] [phone]: direct office

## 2024-06-05 NOTE — Telephone Encounter (Signed)
 Advised that I told patient to double up on her to acetamide and potassium chloride  for 3 more days.  Also asked the patient to stop eating salt because it is making her retain fluids.  Will check her weight again on Monday.

## 2024-06-08 ENCOUNTER — Inpatient Hospital Stay: Attending: Oncology | Admitting: Oncology

## 2024-06-08 ENCOUNTER — Encounter: Payer: Self-pay | Admitting: Oncology

## 2024-06-08 ENCOUNTER — Inpatient Hospital Stay

## 2024-06-08 VITALS — BP 117/46 | HR 95 | Temp 98.6°F | Resp 18 | Wt 173.0 lb

## 2024-06-08 DIAGNOSIS — Z7901 Long term (current) use of anticoagulants: Secondary | ICD-10-CM | POA: Diagnosis not present

## 2024-06-08 DIAGNOSIS — Z86711 Personal history of pulmonary embolism: Secondary | ICD-10-CM | POA: Diagnosis not present

## 2024-06-08 DIAGNOSIS — D539 Nutritional anemia, unspecified: Secondary | ICD-10-CM | POA: Diagnosis not present

## 2024-06-08 DIAGNOSIS — Z79899 Other long term (current) drug therapy: Secondary | ICD-10-CM | POA: Insufficient documentation

## 2024-06-08 DIAGNOSIS — F17210 Nicotine dependence, cigarettes, uncomplicated: Secondary | ICD-10-CM | POA: Diagnosis not present

## 2024-06-08 DIAGNOSIS — D649 Anemia, unspecified: Secondary | ICD-10-CM | POA: Insufficient documentation

## 2024-06-08 DIAGNOSIS — Z139 Encounter for screening, unspecified: Secondary | ICD-10-CM

## 2024-06-08 LAB — CBC WITH DIFFERENTIAL/PLATELET
Abs Immature Granulocytes: 0.08 K/uL — ABNORMAL HIGH (ref 0.00–0.07)
Basophils Absolute: 0.1 K/uL (ref 0.0–0.1)
Basophils Relative: 1 %
Eosinophils Absolute: 0.9 K/uL — ABNORMAL HIGH (ref 0.0–0.5)
Eosinophils Relative: 8 %
HCT: 30 % — ABNORMAL LOW (ref 36.0–46.0)
Hemoglobin: 9.2 g/dL — ABNORMAL LOW (ref 12.0–15.0)
Immature Granulocytes: 1 %
Lymphocytes Relative: 21 %
Lymphs Abs: 2.2 K/uL (ref 0.7–4.0)
MCH: 27.8 pg (ref 26.0–34.0)
MCHC: 30.7 g/dL (ref 30.0–36.0)
MCV: 90.6 fL (ref 80.0–100.0)
Monocytes Absolute: 1 K/uL (ref 0.1–1.0)
Monocytes Relative: 10 %
Neutro Abs: 6.3 K/uL (ref 1.7–7.7)
Neutrophils Relative %: 59 %
Platelets: 306 K/uL (ref 150–400)
RBC: 3.31 MIL/uL — ABNORMAL LOW (ref 3.87–5.11)
RDW: 15.9 % — ABNORMAL HIGH (ref 11.5–15.5)
WBC: 10.6 K/uL — ABNORMAL HIGH (ref 4.0–10.5)
nRBC: 0 % (ref 0.0–0.2)

## 2024-06-08 LAB — FOLATE: Folate: >20 ng/mL (ref >5.9)

## 2024-06-08 LAB — TECHNOLOGIST SMEAR REVIEW: Plt Morphology: ADEQUATE

## 2024-06-08 LAB — VITAMIN B12: Vitamin B-12: 339 pg/mL (ref 180–914)

## 2024-06-08 LAB — LACTATE DEHYDROGENASE: LDH: 204 U/L — ABNORMAL HIGH (ref 98–192)

## 2024-06-08 NOTE — Progress Notes (Signed)
 Hematology/Oncology Consult note [phone] [phone]        REFERRING PROVIDER: Onita Devere POUR, MD   CHIEF COMPLAINTS/REASON FOR VISIT:  Evaluation of pulmonary embolism   ASSESSMENT & PLAN:   History of pulmonary embolus (PE) History of small subsegmental pulmonary embolism, likely provoked due to acute illness. She has no family history of thrombosis and no prior thrombosis history. Patient has been on Eliquis  5 mg twice daily for 4 to 5 months.  Did not tolerate well. Recommend patient to stop Eliquis . I will hold off hypercoagulable workup given the nature of provoked thrombosis. I encourage patient to stay active as tolerated.  Consider prophylactic anticoagulation if any immobilization events.  Normocytic anemia .  Check CBC, smear, B12, folate.  Multiple myeloma panel, LDH, haptoglobin.   Orders Placed This Encounter  Procedures   Vitamin B12    Standing Status:   Future    Number of Occurrences:   1    Expected Date:   06/08/2024    Expiration Date:   09/06/2024   Folate    Standing Status:   Future    Number of Occurrences:   1    Expected Date:   06/08/2024    Expiration Date:   09/06/2024   CBC with Differential/Platelet    Standing Status:   Future    Number of Occurrences:   1    Expected Date:   06/08/2024    Expiration Date:   09/06/2024   Multiple Myeloma Panel (SPEP&IFE w/QIG)    Standing Status:   Future    Number of Occurrences:   1    Expected Date:   06/08/2024    Expiration Date:   09/06/2024   Kappa/lambda light chains    Standing Status:   Future    Number of Occurrences:   1    Expected Date:   06/08/2024    Expiration Date:   09/06/2024   Lactate dehydrogenase    Standing Status:   Future    Number of Occurrences:   1    Expected Date:   06/08/2024    Expiration Date:   09/06/2024   Haptoglobin    Standing Status:   Future    Number of Occurrences:   1    Expected Date:   06/08/2024    Expiration Date:    09/06/2024   Technologist smear review    Standing Status:   Future    Number of Occurrences:   1    Expected Date:   06/08/2024    Expiration Date:   06/08/2025    Clinical information::   macrocytic anemia   Follow-up as needed All questions were answered. The patient knows to call the clinic with any problems, questions or concerns.  Zelphia Cap, MD, PhD [facility] Hematology Oncology 06/08/2024   HISTORY OF PRESENTING ILLNESS:   Abigail [name] is a  58 y.o.  female with PMH listed below was seen in consultation at the request of  [name], Susan K, MD  for evaluation of pulmonary embolism  Discussed the use of AI scribe software for clinical note transcription with the patient, who gave verbal consent to proceed.  Patient has had multiple hospitalizations  In May 2025, she was hospitalized due to acute respiratory failure with hypercapnia, encephalopathy.  Patient was not able to be contacted for 2 days, and when relative checked in on her, she was found to be confused and unable to recognize relative.  She  was sent to ER by EMS.  She will also diagnosed with seizure and was found to have some therapeutic levels of her Keppra , Lamictal  and Tegretol  which was attributed to noncompliance.  In the emergency room, due to hypercapnic respiratory failure, chest CTA was obtained which showed small subsegmental acute PE.  Patient was treated with Eliquis .  Patient reports being compliant on Eliquis  since May 2025.  She reports severe cold intolerance while on Eliquis , and this  exacerbates her rheumatoid arthritis, osteoarthritis, and fibromyalgia symptoms.  She has a history of rheumatoid arthritis, osteoarthritis, and fibromyalgia, causing widespread pain, particularly in her spine, hips, shoulders, and neck. Cold temperatures worsen her symptoms, leading to increased pain and stiffness.  All likely use of motion 5  She has experienced multiple falls over the past two years, attributed  to balance issues rather than dizziness. She uses a walker when feeling unsteady and has home healthcare support. She reports being more mobile now, able to perform basic activities like going to the bathroom.  MEDICAL HISTORY:  Past Medical History:  Diagnosis Date   Anxiety    Arthritis    Back pain    Basal cell carcinoma 10/04/2021   R axilla - needs excised 11/28/21   Basal cell carcinoma 10/04/2021   L antecubital excised 11/14/21   Diarrhea 11/12/2016   Fibromyalgia    Generalized abdominal pain 11/12/2016   H. pylori infection    Hyperlipidemia    IBS (irritable bowel syndrome)    Infectious colitis 04/29/2016   Migraines    Moderate dehydration 04/29/2016   Muscle pain    Opioid overdose (HCC) 12/07/2022   Reflux    Seizures (HCC)    Unexplained weight loss 11/12/2016    SURGICAL HISTORY: Past Surgical History:  Procedure Laterality Date   ABDOMINAL HYSTERECTOMY     APPENDECTOMY  2009   C5 FUSION     C6 FUSION     C7 FUSION     COLONOSCOPY  02/2006   COLONOSCOPY WITH PROPOFOL  N/A 12/25/2016   Procedure: COLONOSCOPY WITH PROPOFOL ;  Surgeon: Dessa Reyes ORN, MD;  Location: [facility] ENDOSCOPY;  Service: Endoscopy;  Laterality: N/A;   ESOPHAGOGASTRODUODENOSCOPY (EGD) WITH PROPOFOL  N/A 12/25/2016   Procedure: ESOPHAGOGASTRODUODENOSCOPY (EGD) WITH PROPOFOL ;  Surgeon: Dessa Reyes ORN, MD;  Location: [facility] ENDOSCOPY;  Service: Endoscopy;  Laterality: N/A;   FOOT SURGERY     HIP ARTHROPLASTY     L4 FUSION     L5 FUSION     S1 FUSION      SOCIAL HISTORY: Social History   Socioeconomic History   Marital status: Divorced    Spouse name: Not on file   Number of children: Not on file   Years of education: Not on file   Highest education level: Not on file  Occupational History   Not on file  Tobacco Use   Smoking status: Every Day    Current packs/day: 1.00    Types: Cigarettes    Passive exposure: Past   Smokeless tobacco: Never   Tobacco comments:     ELECTRONIC VAPOR  Substance and Sexual Activity   Alcohol use: Not Currently    Comment: OCCASIONALLY   Drug use: Not Currently   Sexual activity: Not Currently  Other Topics Concern   Not on file  Social History Narrative   Not on file   Social Drivers of Health   Financial Resource Strain: High Risk (03/10/2024)   Received from [facility]   Overall Financial  Resource Strain (CARDIA)    Difficulty of Paying Living Expenses: Hard  Food Insecurity: No Food Insecurity (05/22/2024)   Hunger Vital Sign    Worried About Running Out of Food in the Last Year: Never true    Ran Out of Food in the Last Year: Never true  Recent Concern: Food Insecurity - Food Insecurity Present (03/10/2024)   Received from [facility]   Hunger Vital Sign    Within the past 12 months, you worried that your food would run out before you got the money to buy more.: Sometimes true    Within the past 12 months, the food you bought just didn't last and you didn't have money to get more.: Sometimes true  Transportation Needs: No Transportation Needs (05/22/2024)   PRAPARE - Administrator, Civil Service (Medical): No    Lack of Transportation (Non-Medical): No  Physical Activity: Inactive (01/27/2024)   Received from [facility]   Exercise Vital Sign    On average, how many days per week do you engage in moderate to strenuous exercise (like a brisk walk)?: 0 days    On average, how many minutes do you engage in exercise at this level?: 0 min  Stress: Stress Concern Present (01/27/2024)   Received from [facility] - Occupational Stress Questionnaire    Feeling of Stress : Very much  Social Connections: Moderately Isolated (01/27/2024)   Received from [facility]   Social Connection and Isolation Panel    In a typical week, how many times do you talk on the phone with family,  friends, or neighbors?: More than three times a week    How often do you get together with friends or relatives?: Twice a week    How often do you attend [place] or religious services?: 1 to 4 times per year    Do you belong to any clubs or organizations such as [place] groups, unions, fraternal or athletic groups, or school groups?: No    How often do you attend meetings of the clubs or organizations you belong to?: Never    Are you married, widowed, divorced, separated, never married, or living with a partner?: Divorced  Intimate Partner Violence: Not At Risk (05/22/2024)   Humiliation, Afraid, Rape, and Kick questionnaire    Fear of Current or Ex-Partner: No    Emotionally Abused: No    Physically Abused: No    Sexually Abused: No    FAMILY HISTORY: Family History  Problem Relation Age of Onset   Diabetes Father    Hypertension Father     ALLERGIES:  is allergic to nsaids, tapentadol, cephalexin , codeine, darvocet [propoxyphene n-acetaminophen ], latex, silicone, sulfa antibiotics, tape, and meloxicam.  MEDICATIONS:  Current Outpatient Medications  Medication Sig Dispense Refill   acetaminophen  (TYLENOL ) 500 MG tablet Take 1,000 mg by mouth every 6 (six) hours as needed for moderate pain (pain score 4-6).     albuterol  (VENTOLIN  HFA) 108 (90 Base) MCG/ACT inhaler Inhale 2 puffs into the lungs. Take two (2) puffs by mouth every 4 to 6 hours as needed for wheezing or shortness of breath     AMBULATORY NON FORMULARY MEDICATION Knee-high, medium compression, graduated compression stockings. Apply to lower extremities. [email or web address], Zippered Compression Stockings, medium circ, long length 1 each 0   bisacodyl  (DULCOLAX) 5 MG EC tablet Take 1 tablet (5 mg total) by mouth daily as needed for moderate  constipation. 30 tablet 0   carbamazepine  (TEGRETOL  XR) 100 MG 12 hr tablet Take 1 tablet (100 mg total) by mouth 2 (two) times daily. 60 tablet 2   Cholecalciferol  (VITAMIN D -3 PO) Take  1 capsule by mouth every morning.     collagenase  (SANTYL ) 250 UNIT/GM ointment Apply 1 Application topically daily. 15 g 3   DULoxetine  (CYMBALTA ) 60 MG capsule Take 1 capsule (60 mg total) by mouth daily. 30 capsule 2   feeding supplement (ENSURE PLUS HIGH PROTEIN) LIQD Take 237 mLs by mouth 3 (three) times daily between meals.     folic acid  (FOLVITE ) 1 MG tablet Take 1 tablet (1 mg total) by mouth daily. 30 tablet 2   gabapentin  (NEURONTIN ) 300 MG capsule Take 1 capsule (300 mg total) by mouth 4 (four) times daily. 120 capsule 0   lamoTRIgine  (LAMICTAL ) 25 MG tablet TAKE 2 TABLETS BY MOUTH 2 TIMES DAILY. 120 tablet 0   levETIRAcetam  (KEPPRA ) 750 MG tablet Take 2 tablets (1,500 mg total) by mouth 2 (two) times daily. 120 tablet 1   Melatonin 10 MG TABS Take 10 mg by mouth at bedtime.     midodrine  (PROAMATINE ) 5 MG tablet Take 5 mg by mouth 3 (three) times daily with meals.     mirtazapine  (REMERON ) 7.5 MG tablet Take 1 tablet (7.5 mg total) by mouth at bedtime. 90 tablet 1   morphine  (MSIR) 15 MG tablet Take 1 tablet (15 mg total) by mouth every 8 (eight) hours as needed for severe pain (pain score 7-10).     NON FORMULARY Apply 1 Application topically 3 (three) times daily as needed. Rock Salt Cream (Similar to [employer])     ondansetron  (ZOFRAN ) 4 MG tablet Take 1 tablet (4 mg total) by mouth every 8 (eight) hours as needed for nausea or vomiting. 30 tablet 1   pantoprazole  (PROTONIX ) 40 MG tablet Take 1 tablet (40 mg total) by mouth daily. 90 tablet 1   potassium chloride  SA (KLOR-CON  M) 20 MEQ tablet TAKE 1 TABLET BY MOUTH EVERY DAY 90 tablet 1   torsemide  (DEMADEX ) 20 MG tablet Take 1 tablet (20 mg total) by mouth daily. 30 tablet 0   No current facility-administered medications for this visit.    Review of Systems  Constitutional:  Negative for appetite change, chills, fatigue and fever.  HENT:   Negative for hearing loss and voice change.   Eyes:  Negative for eye problems.   Respiratory:  Positive for shortness of breath. Negative for chest tightness and cough.   Cardiovascular:  Negative for chest pain.  Gastrointestinal:  Negative for abdominal distention, abdominal pain and blood in stool.  Endocrine:       Cold intolerance  Genitourinary:  Negative for difficulty urinating and frequency.   Musculoskeletal:  Positive for arthralgias, back pain, gait problem and myalgias.  Skin:  Negative for itching and rash.  Neurological:  Positive for gait problem. Negative for extremity weakness.  Hematological:  Negative for adenopathy.  Psychiatric/Behavioral:  Negative for confusion.    PHYSICAL EXAMINATION:  Vitals:   06/08/24 1513  BP: (!) 117/46  Pulse: 95  Resp: 18  Temp: 98.6 F (37 C)  SpO2: 92%   Filed Weights   06/08/24 1513  Weight: 173 lb (78.5 kg)    Physical Exam Constitutional:      General: She is not in acute distress. HENT:     Head: Normocephalic and atraumatic.  Eyes:     General: No scleral  icterus. Cardiovascular:     Rate and Rhythm: Normal rate and regular rhythm.     Heart sounds: Normal heart sounds.  Pulmonary:     Effort: Pulmonary effort is normal. No respiratory distress.     Breath sounds: No wheezing.  Abdominal:     General: Bowel sounds are normal. There is no distension.     Palpations: Abdomen is soft.  Musculoskeletal:        General: No deformity. Normal range of motion.     Cervical back: Normal range of motion and neck supple.  Skin:    General: Skin is warm and dry.     Findings: No erythema or rash.  Neurological:     Mental Status: She is alert and oriented to person, place, and time. Mental status is at baseline.  Psychiatric:        Mood and Affect: Mood normal.     LABORATORY DATA:  I have reviewed the data as listed    Latest Ref Rng & Units 06/08/2024    4:12 PM 05/06/2024    5:10 AM 05/05/2024    6:00 AM  CBC  WBC 4.0 - 10.5 K/uL 10.6  8.7  8.3   Hemoglobin 12.0 - 15.0 g/dL 9.2  8.9   89.8   Hematocrit 36.0 - 46.0 % 30.0  28.7  32.2   Platelets 150 - 400 K/uL 306  253  262       Latest Ref Rng & Units 05/13/2024   11:18 AM 05/06/2024    5:10 AM 05/05/2024    6:00 AM  CMP  Glucose 70 - 99 mg/dL 73  91  875   BUN 6 - 24 mg/dL 11  6  7    Creatinine 0.57 - 1.00 mg/dL 9.29  9.65  9.59   Sodium 134 - 144 mmol/L 135  145  141   Potassium 3.5 - 5.2 mmol/L 3.9  3.3  3.4   Chloride 96 - 106 mmol/L 92  103  106   CO2 20 - 29 mmol/L 30  32  29   Calcium  8.7 - 10.2 mg/dL 8.4  7.7  7.6       RADIOGRAPHIC STUDIES: I have personally reviewed the radiological images as listed and agreed with the findings in the report. DG Foot 2 Views Left Result Date: 05/06/2024 EXAM: 2 VIEW(S) XRAY OF THE LEFT FOOT 05/06/2024 11:43:00 AM COMPARISON: None available. CLINICAL HISTORY: Pain of left heel [id number]. Pain in left heel. FINDINGS: BONES AND JOINTS: Comminuted base of fifth proximal phalanx fracture. 2 K-wires in first metatarsal. Screw in distal second metatarsal. Bunionectomy changes. SOFT TISSUES: The soft tissues are unremarkable. IMPRESSION: 1. Comminuted fracture base of fifth proximal phalanx with callus formation, likely subacute. 2. Mild plantar calcaneal spurring. Electronically signed by: Donnice Mania MD 05/06/2024 01:17 PM EDT RP Workstation: [id number]   US  Venous Img Lower Bilateral Result Date: 05/03/2024 EXAM: ULTRASOUND DUPLEX OF THE BILATERAL LOWER EXTREMITY VEINS TECHNIQUE: Duplex ultrasound using B-mode/gray scaled imaging and Doppler spectral analysis and color flow was obtained of the deep venous structures of the bilateral lower extremity. COMPARISON: 04/03/2024 CLINICAL HISTORY: Swelling, pain. FINDINGS: The visualized veins of the lower extremity are patent and free of echogenic thrombus. The veins demonstrate good compressibility with normal color flow study and spectral analysis. The calf veins are not well visualized. IMPRESSION: 1. No evidence of DVT in the visualized  segments. 2. Limited evaluation of the calf veins. Electronically signed by: Lonni Necessary MD 05/03/2024  06:59 PM EDT RP Workstation: [id number]   DG Chest 2 View Result Date: 05/03/2024 CLINICAL DATA:  Shortness of breath lower extremity swelling EXAM: CHEST - 2 VIEW COMPARISON:  04/03/2024, CT chest 12/29/2023 FINDINGS: Hypoventilatory changes. Streaky atelectasis and or scarring at the bases. No consolidation, pleural effusion or pneumothorax. Probable soft tissue artifact lucency at the left CP angle. Chronic kyphosis and compression deformities at the thoracolumbar region. Hardware at the cervicothoracic spine. IMPRESSION: Hypoventilatory changes with streaky atelectasis and or scarring at the bases. Electronically Signed   By: Luke Bun M.D.   On: 05/03/2024 17:13   CT Head Wo Contrast Result Date: 04/07/2024 CLINICAL DATA:  Head and neck trauma fall EXAM: CT HEAD WITHOUT CONTRAST CT CERVICAL SPINE WITHOUT CONTRAST TECHNIQUE: Multidetector CT imaging of the head and cervical spine was performed following the standard protocol without intravenous contrast. Multiplanar CT image reconstructions of the cervical spine were also generated. RADIATION DOSE REDUCTION: This exam was performed according to the departmental dose-optimization program which includes automated exposure control, adjustment of the mA and/or kV according to patient size and/or use of iterative reconstruction technique. COMPARISON:  CT brain and cervical spine 03/04/2024 FINDINGS: CT HEAD FINDINGS Brain: No acute territorial infarction, hemorrhage or intracranial mass. The ventricles are nonenlarged Vascular: No hyperdense vessel or unexpected calcification. Skull: Normal. Negative for fracture or focal lesion. Sinuses/Orbits: Opacified left maxillary sinus Other: Small left posterior scalp hematoma CT CERVICAL SPINE FINDINGS Alignment: Straightening of the cervical spine. No subluxation. Facet alignment is normal Skull base and  vertebrae: No acute fracture. No primary bone lesion or focal pathologic process. Soft tissues and spinal canal: No prevertebral fluid or swelling. No visible canal hematoma. Disc levels: Anterior fusion hardware C5 through C7. Mild disc space narrowing at C4-C5. Posterior disc osteophyte complex at C3-C4 and C4-C5 with mild canal stenosis. Facet degenerative changes at multiple levels. Moderate bilateral foraminal narrowing C4-C5. Upper chest: Negative. Other: None IMPRESSION: 1. Negative non contrasted CT appearance of the brain. 2. Straightening of the cervical spine with postsurgical changes C5 through C7. No acute osseous abnormality. Electronically Signed   By: Luke Bun M.D.   On: 04/07/2024 00:57   CT Cervical Spine Wo Contrast Result Date: 04/07/2024 CLINICAL DATA:  Head and neck trauma fall EXAM: CT HEAD WITHOUT CONTRAST CT CERVICAL SPINE WITHOUT CONTRAST TECHNIQUE: Multidetector CT imaging of the head and cervical spine was performed following the standard protocol without intravenous contrast. Multiplanar CT image reconstructions of the cervical spine were also generated. RADIATION DOSE REDUCTION: This exam was performed according to the departmental dose-optimization program which includes automated exposure control, adjustment of the mA and/or kV according to patient size and/or use of iterative reconstruction technique. COMPARISON:  CT brain and cervical spine 03/04/2024 FINDINGS: CT HEAD FINDINGS Brain: No acute territorial infarction, hemorrhage or intracranial mass. The ventricles are nonenlarged Vascular: No hyperdense vessel or unexpected calcification. Skull: Normal. Negative for fracture or focal lesion. Sinuses/Orbits: Opacified left maxillary sinus Other: Small left posterior scalp hematoma CT CERVICAL SPINE FINDINGS Alignment: Straightening of the cervical spine. No subluxation. Facet alignment is normal Skull base and vertebrae: No acute fracture. No primary bone lesion or focal  pathologic process. Soft tissues and spinal canal: No prevertebral fluid or swelling. No visible canal hematoma. Disc levels: Anterior fusion hardware C5 through C7. Mild disc space narrowing at C4-C5. Posterior disc osteophyte complex at C3-C4 and C4-C5 with mild canal stenosis. Facet degenerative changes at multiple levels. Moderate bilateral foraminal narrowing C4-C5. Upper chest:  Negative. Other: None IMPRESSION: 1. Negative non contrasted CT appearance of the brain. 2. Straightening of the cervical spine with postsurgical changes C5 through C7. No acute osseous abnormality. Electronically Signed   By: Luke Bun M.D.   On: 04/07/2024 00:57   ECHOCARDIOGRAM COMPLETE Result Date: 04/04/2024    ECHOCARDIOGRAM REPORT   Patient Name:   [facility] Date of Exam: 04/04/2024 Medical Rec #:  [id number]                 Height:       65.0 in Accession #:    [id number]                Weight:       141.0 lb Date of Birth:  09/20/1965                 BSA:          1.705 m Patient Age:    58 years                  BP:           92/57 mmHg Patient Gender: F                         HR:           72 bpm. Exam Location:  [facility] Procedure: 2D Echo, Color Doppler and Cardiac Doppler (Both Spectral and Color            Flow Doppler were utilized during procedure). Indications:     CHF-acute diastolic I50.31  History:         Patient has no prior history of Echocardiogram examinations.                  Migraines.  Sonographer:     Christopher Furnace Referring Phys:  [id number] DELAYNE LULLA SOLIAN Diagnosing Phys: Redell Cave MD  Sonographer Comments: Technically challenging study due to limited acoustic windows and no parasternal window. IMPRESSIONS  1. Left ventricular ejection fraction, by estimation, is 60 to 65%. The left ventricle has normal function. The left ventricle has no regional wall motion abnormalities. Left ventricular diastolic parameters were normal.  2. Right ventricular systolic function is normal. The right  ventricular size is normal.  3. The mitral valve is normal in structure. No evidence of mitral valve regurgitation.  4. The aortic valve was not well visualized. Aortic valve regurgitation is not visualized. FINDINGS  Left Ventricle: Left ventricular ejection fraction, by estimation, is 60 to 65%. The left ventricle has normal function. The left ventricle has no regional wall motion abnormalities. The left ventricular internal cavity size was normal in size. There is  no left ventricular hypertrophy. Left ventricular diastolic parameters were normal. Right Ventricle: The right ventricular size is normal. No increase in right ventricular wall thickness. Right ventricular systolic function is normal. Left Atrium: Left atrial size was normal in size. Right Atrium: Right atrial size was normal in size. Pericardium: There is no evidence of pericardial effusion. Mitral Valve: The mitral valve is normal in structure. No evidence of mitral valve regurgitation. MV peak gradient, 5.2 mmHg. The mean mitral valve gradient is 2.0 mmHg. Tricuspid Valve: The tricuspid valve is not well visualized. Tricuspid valve regurgitation is not demonstrated. Aortic Valve: The aortic valve was not well visualized. Aortic valve regurgitation is not visualized. Aortic valve mean gradient measures 3.0 mmHg. Aortic valve peak gradient measures 5.3  mmHg. Aortic valve area, by VTI measures 3.67 cm. Pulmonic Valve: The pulmonic valve was not well visualized. Pulmonic valve regurgitation is not visualized. Aorta: The aortic root is normal in size and structure. IAS/Shunts: No atrial level shunt detected by color flow Doppler.  LEFT VENTRICLE PLAX 2D LVIDd:         3.70 cm   Diastology LVIDs:         2.40 cm   LV e' medial:    10.10 cm/s LV PW:         1.00 cm   LV E/e' medial:  9.4 LV IVS:        1.10 cm   LV e' lateral:   12.60 cm/s LVOT diam:     2.00 cm   LV E/e' lateral: 7.6 LV SV:         81 LV SV Index:   48 LVOT Area:     3.14 cm  RIGHT  VENTRICLE RV Basal diam:  2.70 cm RV Mid diam:    2.90 cm RV S prime:     14.40 cm/s TAPSE (M-mode): 2.3 cm LEFT ATRIUM             Index        RIGHT ATRIUM          Index LA diam:        3.10 cm 1.82 cm/m   RA Area:     8.45 cm LA Vol (A2C):   19.1 ml 11.20 ml/m  RA Volume:   12.20 ml 7.16 ml/m LA Vol (A4C):   37.0 ml 21.70 ml/m LA Biplane Vol: 27.1 ml 15.89 ml/m  AORTIC VALVE AV Area (Vmax):    2.77 cm AV Area (Vmean):   2.93 cm AV Area (VTI):     3.67 cm AV Vmax:           115.50 cm/s AV Vmean:          77.300 cm/s AV VTI:            0.222 m AV Peak Grad:      5.3 mmHg AV Mean Grad:      3.0 mmHg LVOT Vmax:         102.00 cm/s LVOT Vmean:        72.200 cm/s LVOT VTI:          0.259 m LVOT/AV VTI ratio: 1.17  AORTA Ao Root diam: 3.40 cm MITRAL VALVE               TRICUSPID VALVE MV Area (PHT): 3.72 cm    TR Peak grad:   23.4 mmHg MV Area VTI:   2.51 cm    TR Vmax:        242.00 cm/s MV Peak grad:  5.2 mmHg MV Mean grad:  2.0 mmHg    SHUNTS MV Vmax:       1.14 m/s    Systemic VTI:  0.26 m MV Vmean:      65.8 cm/s   Systemic Diam: 2.00 cm MV Decel Time: 204 msec MV E velocity: 95.40 cm/s MV A velocity: 78.10 cm/s MV E/A ratio:  1.22 Redell Cave MD Electronically signed by Redell Cave MD Signature Date/Time: 04/04/2024/3:38:55 PM    Final    US  Venous Img Lower Bilateral (DVT) Result Date: 04/03/2024 CLINICAL DATA:  Bilateral lower extremity swelling. EXAM: BILATERAL LOWER EXTREMITY VENOUS DOPPLER ULTRASOUND TECHNIQUE: Gray-scale sonography with graded compression, as well as color Doppler and duplex ultrasound  were performed to evaluate the lower extremity deep venous systems from the level of the common femoral vein and including the common femoral, femoral, profunda femoral, popliteal and calf veins including the posterior tibial, peroneal and gastrocnemius veins when visible. The superficial great saphenous vein was also interrogated. Spectral Doppler was utilized to evaluate flow at  rest and with distal augmentation maneuvers in the common femoral, femoral and popliteal veins. COMPARISON:  Dec 31, 2023 FINDINGS: RIGHT LOWER EXTREMITY Common Femoral Vein: No evidence of thrombus. Normal compressibility, respiratory phasicity and response to augmentation. Saphenofemoral Junction: No evidence of thrombus. Normal compressibility and flow on color Doppler imaging. Profunda Femoral Vein: No evidence of thrombus. Normal compressibility and flow on color Doppler imaging. Femoral Vein: No evidence of thrombus. Normal compressibility, respiratory phasicity and response to augmentation. Popliteal Vein: No evidence of thrombus. Normal compressibility, respiratory phasicity and response to augmentation. Calf Veins: The RIGHT posterior tibial vein and RIGHT peroneal vein are poorly visualized. Superficial Great Saphenous Vein: No evidence of thrombus. Normal compressibility. Venous Reflux:  None. Other Findings:  None. LEFT LOWER EXTREMITY Common Femoral Vein: No evidence of thrombus. Normal compressibility, respiratory phasicity and response to augmentation. Saphenofemoral Junction: No evidence of thrombus. Normal compressibility and flow on color Doppler imaging. Profunda Femoral Vein: No evidence of thrombus. Normal compressibility and flow on color Doppler imaging. Femoral Vein: No evidence of thrombus. Normal compressibility, respiratory phasicity and response to augmentation. Popliteal Vein: No evidence of thrombus. Normal compressibility, respiratory phasicity and response to augmentation. Calf Veins: The LEFT posterior tibial vein and LEFT peroneal vein are poorly visualized. Superficial Great Saphenous Vein: No evidence of thrombus. Normal compressibility. Venous Reflux:  None. Other Findings:  None. IMPRESSION: Limited evaluation of the BILATERAL posterior tibial and BILATERAL peroneal veins, without evidence of deep venous thrombosis in either lower extremity. Electronically Signed   By: Suzen Dials M.D.   On: 04/03/2024 20:57   DG Chest 2 View Result Date: 04/03/2024 CLINICAL DATA:  Leg swelling EXAM: CHEST - 2 VIEW COMPARISON:  None Available. FINDINGS: Normal cardiac silhouette. Band of atelectasis in the RIGHT middle lobe. 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 No acute findings. Anterior cervical fusion IMPRESSION: 1. RIGHT middle lobe atelectasis. 2. 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 Electronically Signed   By: Jackquline Boxer M.D.   On: 04/03/2024 19:19

## 2024-06-08 NOTE — Assessment & Plan Note (Signed)
.    Check CBC, smear, B12, folate.  Multiple myeloma panel, LDH, haptoglobin.

## 2024-06-08 NOTE — Assessment & Plan Note (Signed)
 History of small subsegmental pulmonary embolism, likely provoked due to acute illness. She has no family history of thrombosis and no prior thrombosis history. Patient has been on Eliquis  5 mg twice daily for 4 to 5 months.  Did not tolerate well. Recommend patient to stop Eliquis . I will hold off hypercoagulable workup given the nature of provoked thrombosis. I encourage patient to stay active as tolerated.  Consider prophylactic anticoagulation if any immobilization events.

## 2024-06-09 ENCOUNTER — Emergency Department

## 2024-06-09 ENCOUNTER — Observation Stay

## 2024-06-09 ENCOUNTER — Other Ambulatory Visit: Payer: Self-pay

## 2024-06-09 ENCOUNTER — Inpatient Hospital Stay
Admission: EM | Admit: 2024-06-09 | Discharge: 2024-06-12 | DRG: 291 | Disposition: A | Discharge status: Home Health | Attending: Internal Medicine | Admitting: Internal Medicine

## 2024-06-09 ENCOUNTER — Telehealth: Payer: Self-pay

## 2024-06-09 DIAGNOSIS — I5033 Acute on chronic diastolic (congestive) heart failure: Secondary | ICD-10-CM | POA: Diagnosis present

## 2024-06-09 DIAGNOSIS — K861 Other chronic pancreatitis: Secondary | ICD-10-CM | POA: Diagnosis present

## 2024-06-09 DIAGNOSIS — R4182 Altered mental status, unspecified: Secondary | ICD-10-CM | POA: Diagnosis not present

## 2024-06-09 DIAGNOSIS — R5383 Other fatigue: Secondary | ICD-10-CM | POA: Diagnosis not present

## 2024-06-09 DIAGNOSIS — E8770 Fluid overload, unspecified: Secondary | ICD-10-CM | POA: Diagnosis not present

## 2024-06-09 DIAGNOSIS — Z7901 Long term (current) use of anticoagulants: Secondary | ICD-10-CM | POA: Diagnosis not present

## 2024-06-09 DIAGNOSIS — J811 Chronic pulmonary edema: Secondary | ICD-10-CM | POA: Diagnosis not present

## 2024-06-09 DIAGNOSIS — I110 Hypertensive heart disease with heart failure: Secondary | ICD-10-CM | POA: Diagnosis not present

## 2024-06-09 DIAGNOSIS — Z833 Family history of diabetes mellitus: Secondary | ICD-10-CM | POA: Diagnosis not present

## 2024-06-09 DIAGNOSIS — D649 Anemia, unspecified: Secondary | ICD-10-CM | POA: Diagnosis present

## 2024-06-09 DIAGNOSIS — Z91040 Latex allergy status: Secondary | ICD-10-CM

## 2024-06-09 DIAGNOSIS — K909 Intestinal malabsorption, unspecified: Secondary | ICD-10-CM | POA: Diagnosis present

## 2024-06-09 DIAGNOSIS — R0989 Other specified symptoms and signs involving the circulatory and respiratory systems: Secondary | ICD-10-CM | POA: Diagnosis not present

## 2024-06-09 DIAGNOSIS — Z86711 Personal history of pulmonary embolism: Secondary | ICD-10-CM | POA: Diagnosis not present

## 2024-06-09 DIAGNOSIS — Z87891 Personal history of nicotine dependence: Secondary | ICD-10-CM | POA: Diagnosis not present

## 2024-06-09 DIAGNOSIS — Z981 Arthrodesis status: Secondary | ICD-10-CM

## 2024-06-09 DIAGNOSIS — R918 Other nonspecific abnormal finding of lung field: Secondary | ICD-10-CM | POA: Diagnosis not present

## 2024-06-09 DIAGNOSIS — Z886 Allergy status to analgesic agent status: Secondary | ICD-10-CM | POA: Diagnosis not present

## 2024-06-09 DIAGNOSIS — Z8249 Family history of ischemic heart disease and other diseases of the circulatory system: Secondary | ICD-10-CM | POA: Diagnosis not present

## 2024-06-09 DIAGNOSIS — J9811 Atelectasis: Secondary | ICD-10-CM | POA: Diagnosis present

## 2024-06-09 DIAGNOSIS — J449 Chronic obstructive pulmonary disease, unspecified: Secondary | ICD-10-CM | POA: Diagnosis present

## 2024-06-09 DIAGNOSIS — F419 Anxiety disorder, unspecified: Secondary | ICD-10-CM | POA: Diagnosis present

## 2024-06-09 DIAGNOSIS — Z90710 Acquired absence of both cervix and uterus: Secondary | ICD-10-CM | POA: Diagnosis not present

## 2024-06-09 DIAGNOSIS — R296 Repeated falls: Secondary | ICD-10-CM | POA: Diagnosis present

## 2024-06-09 DIAGNOSIS — M797 Fibromyalgia: Secondary | ICD-10-CM | POA: Diagnosis present

## 2024-06-09 DIAGNOSIS — G9341 Metabolic encephalopathy: Secondary | ICD-10-CM | POA: Diagnosis present

## 2024-06-09 DIAGNOSIS — E785 Hyperlipidemia, unspecified: Secondary | ICD-10-CM | POA: Diagnosis present

## 2024-06-09 DIAGNOSIS — Z885 Allergy status to narcotic agent status: Secondary | ICD-10-CM

## 2024-06-09 DIAGNOSIS — E8809 Other disorders of plasma-protein metabolism, not elsewhere classified: Secondary | ICD-10-CM | POA: Diagnosis present

## 2024-06-09 DIAGNOSIS — Z79899 Other long term (current) drug therapy: Secondary | ICD-10-CM

## 2024-06-09 DIAGNOSIS — J9601 Acute respiratory failure with hypoxia: Secondary | ICD-10-CM | POA: Diagnosis present

## 2024-06-09 DIAGNOSIS — Z888 Allergy status to other drugs, medicaments and biological substances status: Secondary | ICD-10-CM

## 2024-06-09 DIAGNOSIS — G40909 Epilepsy, unspecified, not intractable, without status epilepticus: Secondary | ICD-10-CM | POA: Diagnosis present

## 2024-06-09 DIAGNOSIS — R000 Tachycardia, unspecified: Secondary | ICD-10-CM | POA: Diagnosis not present

## 2024-06-09 DIAGNOSIS — I509 Heart failure, unspecified: Principal | ICD-10-CM

## 2024-06-09 DIAGNOSIS — Z85828 Personal history of other malignant neoplasm of skin: Secondary | ICD-10-CM | POA: Diagnosis not present

## 2024-06-09 DIAGNOSIS — R531 Weakness: Secondary | ICD-10-CM | POA: Diagnosis not present

## 2024-06-09 DIAGNOSIS — Z882 Allergy status to sulfonamides status: Secondary | ICD-10-CM

## 2024-06-09 DIAGNOSIS — I9589 Other hypotension: Secondary | ICD-10-CM | POA: Diagnosis present

## 2024-06-09 DIAGNOSIS — R0602 Shortness of breath: Secondary | ICD-10-CM | POA: Diagnosis not present

## 2024-06-09 DIAGNOSIS — Z91048 Other nonmedicinal substance allergy status: Secondary | ICD-10-CM

## 2024-06-09 LAB — COMPREHENSIVE METABOLIC PANEL WITH GFR
ALT: 10 U/L (ref 0–44)
AST: 25 U/L (ref 15–41)
Albumin: 2.4 g/dL — ABNORMAL LOW (ref 3.5–5.0)
Alkaline Phosphatase: 146 U/L — ABNORMAL HIGH (ref 38–126)
Anion gap: 11 (ref 5–15)
BUN: 9 mg/dL (ref 6–20)
CO2: 29 mmol/L (ref 22–32)
Calcium: 8.1 mg/dL — ABNORMAL LOW (ref 8.9–10.3)
Chloride: 96 mmol/L — ABNORMAL LOW (ref 98–111)
Creatinine, Ser: 0.61 mg/dL (ref 0.44–1.00)
GFR, Estimated: >60 mL/min (ref >60)
Glucose, Bld: 97 mg/dL (ref 70–99)
Potassium: 4 mmol/L (ref 3.5–5.1)
Sodium: 136 mmol/L (ref 135–145)
Total Bilirubin: 0.4 mg/dL (ref 0.0–1.2)
Total Protein: 6.7 g/dL (ref 6.5–8.1)

## 2024-06-09 LAB — CBC
HCT: 27.2 % — ABNORMAL LOW (ref 36.0–46.0)
Hemoglobin: 8.1 g/dL — ABNORMAL LOW (ref 12.0–15.0)
MCH: 27.4 pg (ref 26.0–34.0)
MCHC: 29.8 g/dL — ABNORMAL LOW (ref 30.0–36.0)
MCV: 91.9 fL (ref 80.0–100.0)
Platelets: 265 K/uL (ref 150–400)
RBC: 2.96 MIL/uL — ABNORMAL LOW (ref 3.87–5.11)
RDW: 15.9 % — ABNORMAL HIGH (ref 11.5–15.5)
WBC: 8.4 K/uL (ref 4.0–10.5)
nRBC: 0 % (ref 0.0–0.2)

## 2024-06-09 LAB — URINALYSIS, ROUTINE W REFLEX MICROSCOPIC
Bilirubin Urine: NEGATIVE
Glucose, UA: NEGATIVE mg/dL
Hgb urine dipstick: NEGATIVE
Ketones, ur: NEGATIVE mg/dL
Leukocytes,Ua: NEGATIVE
Nitrite: NEGATIVE
Protein, ur: NEGATIVE mg/dL
Specific Gravity, Urine: 1.003 — ABNORMAL LOW (ref 1.005–1.030)
pH: 8 (ref 5.0–8.0)

## 2024-06-09 LAB — BLOOD GAS, VENOUS
Acid-Base Excess: 9 mmol/L — ABNORMAL HIGH (ref 0.0–2.0)
Bicarbonate: 35.9 mmol/L — ABNORMAL HIGH (ref 20.0–28.0)
O2 Saturation: 65.4 %
Patient temperature: 37
pCO2, Ven: 58 mmHg (ref 44–60)
pH, Ven: 7.4 (ref 7.25–7.43)
pO2, Ven: 40 mmHg (ref 32–45)

## 2024-06-09 LAB — KAPPA/LAMBDA LIGHT CHAINS
Kappa free light chain: 42.3 mg/L — ABNORMAL HIGH (ref 3.3–19.4)
Kappa, lambda light chain ratio: 1.47 (ref 0.26–1.65)
Lambda free light chains: 28.7 mg/L — ABNORMAL HIGH (ref 5.7–26.3)

## 2024-06-09 LAB — IRON AND TIBC
Iron: 30 ug/dL (ref 28–170)
Saturation Ratios: 7 % — ABNORMAL LOW (ref 10.4–31.8)
TIBC: 455 ug/dL — ABNORMAL HIGH (ref 250–450)
UIBC: 425 ug/dL

## 2024-06-09 LAB — PREALBUMIN: Prealbumin: 12 mg/dL — ABNORMAL LOW (ref 18–38)

## 2024-06-09 LAB — VITAMIN B12: Vitamin B-12: 306 pg/mL (ref 180–914)

## 2024-06-09 LAB — FERRITIN: Ferritin: 19 ng/mL (ref 11–307)

## 2024-06-09 LAB — FOLATE: Folate: >20 ng/mL (ref >5.9)

## 2024-06-09 LAB — TSH: TSH: 1.647 u[IU]/mL (ref 0.350–4.500)

## 2024-06-09 LAB — TROPONIN I (HIGH SENSITIVITY)
Troponin I (High Sensitivity): 8 ng/L (ref <18)
Troponin I (High Sensitivity): 7 ng/L (ref <18)

## 2024-06-09 LAB — BRAIN NATRIURETIC PEPTIDE: B Natriuretic Peptide: 104.4 pg/mL — ABNORMAL HIGH (ref 0.0–100.0)

## 2024-06-09 MED ORDER — PANCRELIPASE (LIP-PROT-AMYL) 12000-38000 UNITS PO CPEP
24000.0000 [IU] | ORAL_CAPSULE | Freq: Three times a day (TID) | ORAL | Status: DC
  Administered 2024-06-10 – 2024-06-12 (×7): 24000 [IU] via ORAL
  Filled 2024-06-09 (×11): qty 2

## 2024-06-09 MED ORDER — CARBAMAZEPINE ER 100 MG PO TB12
100.0000 mg | ORAL_TABLET | Freq: Two times a day (BID) | ORAL | Status: DC
  Administered 2024-06-09 – 2024-06-12 (×6): 100 mg via ORAL
  Filled 2024-06-09 (×8): qty 1

## 2024-06-09 MED ORDER — ONDANSETRON HCL 4 MG/2ML IJ SOLN: 4.0000 mg | Freq: Four times a day (QID) | INTRAMUSCULAR | Status: DC | PRN

## 2024-06-09 MED ORDER — GABAPENTIN 300 MG PO CAPS: 300.0000 mg | ORAL_CAPSULE | Freq: Four times a day (QID) | ORAL | Status: DC

## 2024-06-09 MED ORDER — IRON SUCROSE 20 MG/ML IV SOLN
300.0000 mg | Freq: Once | INTRAVENOUS | Status: DC
  Filled 2024-06-09: qty 15

## 2024-06-09 MED ORDER — LAMOTRIGINE 25 MG PO TABS
50.0000 mg | ORAL_TABLET | Freq: Two times a day (BID) | ORAL | Status: DC
Start: 2024-06-09 — End: 2024-06-09
  Filled 2024-06-09: qty 2

## 2024-06-09 MED ORDER — CARBAMAZEPINE ER 100 MG PO TB12
100.0000 mg | ORAL_TABLET | Freq: Two times a day (BID) | ORAL | Status: DC
  Filled 2024-06-09: qty 1

## 2024-06-09 MED ORDER — LEVETIRACETAM 500 MG PO TABS
1500.0000 mg | ORAL_TABLET | Freq: Two times a day (BID) | ORAL | Status: DC
Start: 2024-06-09 — End: 2024-06-12
  Administered 2024-06-09 – 2024-06-12 (×6): 1500 mg via ORAL
  Filled 2024-06-09 (×6): qty 3

## 2024-06-09 MED ORDER — FUROSEMIDE 10 MG/ML IJ SOLN
40.0000 mg | Freq: Once | INTRAMUSCULAR | Status: AC
Start: 2024-06-09 — End: 2024-06-09
  Administered 2024-06-09: 40 mg via INTRAVENOUS
  Filled 2024-06-09: qty 4

## 2024-06-09 MED ORDER — MELATONIN 5 MG PO TABS
10.0000 mg | ORAL_TABLET | Freq: Every day | ORAL | Status: DC
  Administered 2024-06-09 – 2024-06-11 (×3): 10 mg via ORAL
  Filled 2024-06-09 (×3): qty 2

## 2024-06-09 MED ORDER — GABAPENTIN 300 MG PO CAPS
300.0000 mg | ORAL_CAPSULE | Freq: Four times a day (QID) | ORAL | Status: DC
Start: 2024-06-09 — End: 2024-06-12
  Administered 2024-06-09 – 2024-06-12 (×11): 300 mg via ORAL
  Filled 2024-06-09 (×12): qty 1

## 2024-06-09 MED ORDER — MIDODRINE HCL 5 MG PO TABS: 2.5000 mg | ORAL_TABLET | Freq: Three times a day (TID) | ORAL | Status: DC

## 2024-06-09 MED ORDER — IRON SUCROSE 300 MG IVPB - SIMPLE MED
300.0000 mg | Freq: Once | Status: AC
  Administered 2024-06-09: 300 mg via INTRAVENOUS
  Filled 2024-06-09: qty 300

## 2024-06-09 MED ORDER — APIXABAN 5 MG PO TABS
5.0000 mg | ORAL_TABLET | Freq: Two times a day (BID) | ORAL | Status: DC
  Administered 2024-06-09 – 2024-06-10 (×2): 5 mg via ORAL
  Filled 2024-06-09 (×2): qty 1

## 2024-06-09 MED ORDER — ALBUTEROL SULFATE (2.5 MG/3ML) 0.083% IN NEBU
3.0000 mL | INHALATION_SOLUTION | Freq: Four times a day (QID) | RESPIRATORY_TRACT | Status: DC | PRN
Start: 2024-06-09 — End: 2024-06-12

## 2024-06-09 MED ORDER — MIRTAZAPINE 15 MG PO TABS
7.5000 mg | ORAL_TABLET | Freq: Every day | ORAL | Status: DC
  Administered 2024-06-09 – 2024-06-11 (×3): 7.5 mg via ORAL
  Filled 2024-06-09 (×3): qty 1

## 2024-06-09 MED ORDER — SODIUM CHLORIDE 0.9 % IV SOLN: 250.0000 mL | INTRAVENOUS | Status: AC | PRN

## 2024-06-09 MED ORDER — ALBUMIN HUMAN 25 % IV SOLN
25.0000 g | Freq: Four times a day (QID) | INTRAVENOUS | Status: AC
  Administered 2024-06-09 (×2): 12.5 g via INTRAVENOUS
  Filled 2024-06-09 (×3): qty 100

## 2024-06-09 MED ORDER — FUROSEMIDE 10 MG/ML IJ SOLN
40.0000 mg | Freq: Two times a day (BID) | INTRAMUSCULAR | Status: DC
  Administered 2024-06-09 – 2024-06-12 (×6): 40 mg via INTRAVENOUS
  Filled 2024-06-09 (×6): qty 4

## 2024-06-09 MED ORDER — MIDODRINE HCL 5 MG PO TABS
5.0000 mg | ORAL_TABLET | Freq: Three times a day (TID) | ORAL | Status: DC
Start: 2024-06-09 — End: 2024-06-12
  Administered 2024-06-10 – 2024-06-12 (×8): 5 mg via ORAL
  Filled 2024-06-09 (×8): qty 1

## 2024-06-09 MED ORDER — LEVETIRACETAM 500 MG PO TABS: 1500.0000 mg | ORAL_TABLET | Freq: Two times a day (BID) | ORAL | Status: DC

## 2024-06-09 MED ORDER — DULOXETINE HCL 30 MG PO CPEP
60.0000 mg | ORAL_CAPSULE | Freq: Every day | ORAL | Status: DC
  Administered 2024-06-10 – 2024-06-12 (×3): 60 mg via ORAL
  Filled 2024-06-09 (×2): qty 2
  Filled 2024-06-09: qty 1

## 2024-06-09 MED ORDER — LAMOTRIGINE 25 MG PO TABS
50.0000 mg | ORAL_TABLET | Freq: Two times a day (BID) | ORAL | Status: DC
Start: 2024-06-09 — End: 2024-06-12
  Administered 2024-06-09 – 2024-06-12 (×6): 50 mg via ORAL
  Filled 2024-06-09: qty 1
  Filled 2024-06-09 (×2): qty 2
  Filled 2024-06-09 (×2): qty 1

## 2024-06-09 MED ORDER — SODIUM CHLORIDE 0.9% FLUSH
3.0000 mL | Freq: Two times a day (BID) | INTRAVENOUS | Status: DC
  Administered 2024-06-09 – 2024-06-12 (×7): 3 mL via INTRAVENOUS

## 2024-06-09 MED ORDER — PANTOPRAZOLE SODIUM 40 MG PO TBEC
40.0000 mg | DELAYED_RELEASE_TABLET | Freq: Every day | ORAL | Status: DC
  Administered 2024-06-10 – 2024-06-12 (×3): 40 mg via ORAL
  Filled 2024-06-09 (×3): qty 1

## 2024-06-09 MED ORDER — ACETAMINOPHEN 500 MG PO TABS
1000.0000 mg | ORAL_TABLET | Freq: Four times a day (QID) | ORAL | Status: DC | PRN
  Administered 2024-06-09 – 2024-06-12 (×4): 1000 mg via ORAL
  Filled 2024-06-09 (×4): qty 2

## 2024-06-09 MED ORDER — SODIUM CHLORIDE 0.9% FLUSH: 3.0000 mL | INTRAVENOUS | Status: DC | PRN

## 2024-06-09 MED ORDER — MORPHINE SULFATE (PF) 2 MG/ML IV SOLN
2.0000 mg | INTRAVENOUS | Status: DC | PRN
  Administered 2024-06-09 – 2024-06-12 (×12): 2 mg via INTRAVENOUS
  Filled 2024-06-09 (×12): qty 1

## 2024-06-09 NOTE — Telephone Encounter (Signed)
 Copied from CRM [id number]. Topic: General - Other >> Jun 09, 2024  3:35 PM Nathanel BROCKS wrote: Reason for CRM: pt was 163 but gained up to 173.  She went to the Er this morning. She had a mental status change and speech was slurried.  Tosha with [employer] reported.   Spoke with her mom and fluid went to her lungs and something going on with pancreas, going to admit to hosp.

## 2024-06-09 NOTE — H&P (Addendum)
 History and Physical    Abigail [name] FMW:[id number] DOB: [birth year] DOA: 06/09/2024  PCP: [name], Susan K, MD (Confirm with patient/family/NH records and if not entered, this has to be entered at [facility] point of entry) Patient coming from: Home  I have personally briefly reviewed patient's old medical records in [name]  Chief Complaint: SOB, fluid overload  HPI: [name] is a 58 y.o. 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  Mother at bedside gave history.  Family reported that patient has been  building up fluid for 3 to 4 weeks, mother with patient every day and found her weight  fluctuate, 3 days ago she was started on diuresis, but patient continued to have fluid buildup, especially her legs, with fluid weeping out and ulcers develop.  She also started develop exertional dyspnea.  Mother also reported that patient has had multiple loose diarrhea since last night.  ED Course: Afebrile, blood pressure 124/68 O2 saturation 99% on 4 L.  Chest x-ray showed pulmonary congestion with right lower lobe atelectasis.  Blood work showed albumin 2.4 sodium 136 potassium 4.0 BUN 9 creatinine 0.6 glucose 97, VBG 7.40/58/40.  Patient was given Lasix  x 1 in the ED.  Review of Systems: Unable to perform, patient is confused and lethargic  Past Medical History:  Diagnosis Date   Anxiety    Arthritis    Back pain    Basal cell carcinoma 10/04/2021   R axilla - needs excised 11/28/21   Basal cell carcinoma 10/04/2021   L antecubital excised 11/14/21   Diarrhea 11/12/2016   Fibromyalgia    Generalized abdominal pain 11/12/2016   H. pylori infection    Hyperlipidemia    IBS (irritable bowel syndrome)    Infectious colitis 04/29/2016   Migraines    Moderate dehydration 04/29/2016   Muscle  pain    Opioid overdose (HCC) 12/07/2022   Reflux    Seizures (HCC)    Unexplained weight loss 11/12/2016    Past Surgical History:  Procedure Laterality Date   ABDOMINAL HYSTERECTOMY     APPENDECTOMY  2009   C5 FUSION     C6 FUSION     C7 FUSION     COLONOSCOPY  02/2006   COLONOSCOPY WITH PROPOFOL  N/A 12/25/2016   Procedure: COLONOSCOPY WITH PROPOFOL ;  Surgeon: Dessa Reyes ORN, MD;  Location: [facility] ENDOSCOPY;  Service: Endoscopy;  Laterality: N/A;   ESOPHAGOGASTRODUODENOSCOPY (EGD) WITH PROPOFOL  N/A 12/25/2016   Procedure: ESOPHAGOGASTRODUODENOSCOPY (EGD) WITH PROPOFOL ;  Surgeon: Dessa Reyes ORN, MD;  Location: [facility] ENDOSCOPY;  Service: Endoscopy;  Laterality: N/A;   FOOT SURGERY     HIP ARTHROPLASTY     L4 FUSION     L5 FUSION     S1 FUSION       reports that she has been smoking cigarettes. She has been exposed to tobacco smoke. She has never used smokeless tobacco. She reports that she does not currently use alcohol. She reports that she does not currently use drugs.  Allergies  Allergen Reactions   Nsaids Hives   Tapentadol Swelling, Rash and Other (See Comments)    Nucynta- Made her deathly sick   Cephalexin  Other (See Comments)    Reaction not cited   Codeine Other (See Comments)    Reaction not cited   Darvocet [Propoxyphene N-Acetaminophen ] Other (See Comments)  Reaction not cited   Latex Other (See Comments)    Reaction not cited   Silicone Other (See Comments)    Reaction not cited   Sulfa Antibiotics Hives   Tape Other (See Comments)    Reaction not cited   Meloxicam Rash    Family History  Problem Relation Age of Onset   Diabetes Father    Hypertension Father      Prior to Admission medications   Medication Sig Start Date End Date Taking? Authorizing Provider  acetaminophen  (TYLENOL ) 500 MG tablet Take 1,000 mg by mouth every 6 (six) hours as needed for moderate pain (pain score 4-6).    [provider]  albuterol  (VENTOLIN  HFA) 108  (90 Base) MCG/ACT inhaler Inhale 2 puffs into the lungs. Take two (2) puffs by mouth every 4 to 6 hours as needed for wheezing or shortness of breath    [provider]  AMBULATORY NON FORMULARY MEDICATION Knee-high, medium compression, graduated compression stockings. Apply to lower extremities. [email or web address], Zippered Compression Stockings, medium circ, long length 03/04/24   [name], Susan K, MD  bisacodyl  (DULCOLAX) 5 MG EC tablet Take 1 tablet (5 mg total) by mouth daily as needed for moderate constipation. 10/04/23   Maree Hue, MD  carbamazepine  (TEGRETOL  XR) 100 MG 12 hr tablet Take 1 tablet (100 mg total) by mouth 2 (two) times daily. 01/02/24 05/20/25  Wouk, Devaughn Sayres, MD  Cholecalciferol  (VITAMIN D -3 PO) Take 1 capsule by mouth every morning.    [provider]  collagenase  (SANTYL ) 250 UNIT/GM ointment Apply 1 Application topically daily. 03/26/24   [name], Susan K, MD  DULoxetine  (CYMBALTA ) 60 MG capsule Take 1 capsule (60 mg total) by mouth daily. 01/17/24   [name], Susan K, MD  feeding supplement (ENSURE PLUS HIGH PROTEIN) LIQD Take 237 mLs by mouth 3 (three) times daily between meals. 04/06/24   Von Bellis, MD  folic acid  (FOLVITE ) 1 MG tablet Take 1 tablet (1 mg total) by mouth daily. 05/07/24 08/05/24  Von Bellis, MD  gabapentin  (NEURONTIN ) 300 MG capsule Take 1 capsule (300 mg total) by mouth 4 (four) times daily. 10/04/23 05/20/25  Maree Hue, MD  lamoTRIgine  (LAMICTAL ) 25 MG tablet TAKE 2 TABLETS BY MOUTH 2 TIMES DAILY. 05/28/24   [name], Susan K, MD  levETIRAcetam  (KEPPRA ) 750 MG tablet Take 2 tablets (1,500 mg total) by mouth 2 (two) times daily. 01/02/24   Wouk, Devaughn Sayres, MD  Melatonin 10 MG TABS Take 10 mg by mouth at bedtime.    [provider]  midodrine  (PROAMATINE ) 5 MG tablet Take 5 mg by mouth 3 (three) times daily with meals.    [provider]  mirtazapine  (REMERON ) 7.5 MG tablet Take 1 tablet (7.5 mg total) by mouth at  bedtime. 05/26/24   [name], Susan K, MD  morphine  (MSIR) 15 MG tablet Take 1 tablet (15 mg total) by mouth every 8 (eight) hours as needed for severe pain (pain score 7-10). 11/05/23   Jens Durand, MD  NON FORMULARY Apply 1 Application topically 3 (three) times daily as needed. Rock Salt Cream (Similar to [employer])    [provider]  ondansetron  (ZOFRAN ) 4 MG tablet Take 1 tablet (4 mg total) by mouth every 8 (eight) hours as needed for nausea or vomiting. 01/17/24   [name], Susan K, MD  pantoprazole  (PROTONIX ) 40 MG tablet Take 1 tablet (40 mg total) by mouth daily. 03/11/24   [name], Susan K, MD  potassium chloride  SA (KLOR-CON  M)  20 MEQ tablet TAKE 1 TABLET BY MOUTH EVERY DAY 04/17/24   [name], Susan K, MD  torsemide  (DEMADEX ) 20 MG tablet Take 1 tablet (20 mg total) by mouth daily. 05/26/24 06/25/24  [name], Susan K, MD    Physical Exam: Vitals:   06/09/24 1038 06/09/24 1040 06/09/24 1130 06/09/24 1524  BP:  124/68 112/66   Pulse:  96 92   Resp:  (!) 28 15   Temp:  99 F (37.2 C)  98.8 F (37.1 C)  TempSrc:  Oral  Oral  SpO2:  100% 99%   Weight: 78.5 kg     Height: 5' 5 (1.651 m)       Constitutional: NAD, calm, comfortable Vitals:   06/09/24 1038 06/09/24 1040 06/09/24 1130 06/09/24 1524  BP:  124/68 112/66   Pulse:  96 92   Resp:  (!) 28 15   Temp:  99 F (37.2 C)  98.8 F (37.1 C)  TempSrc:  Oral  Oral  SpO2:  100% 99%   Weight: 78.5 kg     Height: 5' 5 (1.651 m)      Eyes: PERRL, lids and conjunctivae normal ENMT: Mucous membranes are moist. Posterior pharynx clear of any exudate or lesions.Normal dentition.  Neck: normal, supple, no masses, no thyromegaly Respiratory: clear to auscultation bilaterally, no wheezing, bilateral fine crackles, increasing breathing effort. No accessory muscle use.  Cardiovascular: Regular rate and rhythm, no murmurs / rubs / gallops.  Anasarca to bilateral upper thighs. 2+ pedal pulses. No carotid bruits.  Abdomen: no tenderness,  no masses palpated. No hepatosplenomegaly. Bowel sounds positive.  Musculoskeletal: no clubbing / cyanosis. No joint deformity upper and lower extremities. Good ROM, no contractures. Normal muscle tone.  Skin: no rashes, lesions, ulcers. No induration Neurologic: No facial droops, moving limbs Psychiatric: Lethargic, arousable, confused.   Labs on Admission: I have personally reviewed following labs and imaging studies  CBC: Recent Labs  Lab 06/08/24 1612 06/09/24 1448  WBC 10.6* 8.4  NEUTROABS 6.3  --   HGB 9.2* 8.1*  HCT 30.0* 27.2*  MCV 90.6 91.9  PLT 306 265   Basic Metabolic Panel: Recent Labs  Lab 06/09/24 1158  NA 136  [name] 4.0  CL 96*  CO2 29  GLUCOSE 97  BUN 9  CREATININE 0.61  CALCIUM  8.1*   GFR: Estimated Creatinine Clearance: 78.4 mL/min (by C-G formula based on SCr of 0.61 mg/dL). Liver Function Tests: Recent Labs  Lab 06/09/24 1158  AST 25  ALT 10  ALKPHOS 146*  BILITOT 0.4  PROT 6.7  ALBUMIN 2.4*   No results for input(s): LIPASE, AMYLASE in the last 168 hours. No results for input(s): AMMONIA in the last 168 hours. Coagulation Profile: No results for input(s): INR, PROTIME in the last 168 hours. Cardiac Enzymes: No results for input(s): CKTOTAL, CKMB, CKMBINDEX, TROPONINI in the last 168 hours. BNP (last 3 results) No results for input(s): PROBNP in the last 8760 hours. HbA1C: No results for input(s): HGBA1C in the last 72 hours. CBG: No results for input(s): GLUCAP in the last 168 hours. Lipid Profile: No results for input(s): CHOL, HDL, LDLCALC, TRIG, CHOLHDL, LDLDIRECT in the last 72 hours. Thyroid  Function Tests: No results for input(s): TSH, T4TOTAL, FREET4, T3FREE, THYROIDAB in the last 72 hours. Anemia Panel: Recent Labs    06/08/24 1612  VITAMINB12 339  FOLATE >20.0   Urine analysis:    Component Value Date/Time   COLORURINE STRAW (A) 06/09/2024 1500   APPEARANCEUR CLEAR (A)  06/09/2024 1500   APPEARANCEUR Clear 05/24/2014 1903   LABSPEC 1.003 (L) 06/09/2024 1500   LABSPEC 1.015 05/24/2014 1903   PHURINE 8.0 06/09/2024 1500   GLUCOSEU NEGATIVE 06/09/2024 1500   GLUCOSEU Negative 05/24/2014 1903   HGBUR NEGATIVE 06/09/2024 1500   BILIRUBINUR NEGATIVE 06/09/2024 1500   BILIRUBINUR Negative 05/24/2014 1903   KETONESUR NEGATIVE 06/09/2024 1500   PROTEINUR NEGATIVE 06/09/2024 1500   NITRITE NEGATIVE 06/09/2024 1500   LEUKOCYTESUR NEGATIVE 06/09/2024 1500   LEUKOCYTESUR Negative 05/24/2014 1903    Radiological Exams on Admission: DG Chest 2 View Result Date: 06/09/2024 EXAM: 2 VIEW(S) XRAY OF THE CHEST 06/09/2024 11:30:27 AM COMPARISON: 05/03/2024 CLINICAL HISTORY: SOB. Fluid retention / shob FINDINGS: LUNGS AND PLEURA: Low lung volumes. Elevated right hemidiaphragm with right base atelectasis. Diffuse interstitial prominence consistent with interstitial edema. No pleural effusion. No pneumothorax. HEART AND MEDIASTINUM: Cardiomegaly. Calcified aorta. BONES AND SOFT TISSUES: Cervical fixation hardware. IMPRESSION: 1. Diffuse interstitial prominence consistent with interstitial edema. 2. Low lung volumes with elevated right hemidiaphragm and right basilar atelectasis. Electronically signed by: Rockey Kilts MD 06/09/2024 12:42 PM EDT RP Workstation: [id number]    EKG: Independently reviewed.  Sinus rhythm, no acute ST changes.  Computer reported as a flutter 3:1 transaction however on review of the image there is a P wave before each QRS on multiple leads.  Assessment/Plan Principal Problem:   CHF (congestive heart failure) (HCC)  (please populate well all problems here in Problem List. (For example, if patient is on BP meds at home and you resume or decide to hold them, it is a problem that needs to be her. Same for CAD, COPD, HLD and so on)  Fluid overload Worsening of chronic hypoalbuminemia - No clear etiology, patient has a normal echo 2 months ago. -  Clinically suspect patient has worsening of chronic hypoalbuminemia, further suspect patient has a pancreatic insufficiency and chronic malabsorption.  As other etiology of hypoalbuminemia are unlikely, recent imaging study CT abdomen showed no liver cirrhosis, UA showed no proteinuria.  And CT abdomen showed patient has chronic pancreatitis with pseudocysts formation. - Will give albumin x 3 doses to enhance diuresis - Start Creon  - Check prealbumin level  Acute metabolic encephalopathy - Unclear etiology.  VBG showed normal pH and pCO2 - Given that patient is on anticoagulation will check CT head - Avoid sedation medications - Other DDx, there was no cough no leukocytosis or fever, x-ray finding more compatible with fluid overload, will check UA and procalcitonin and lactic acid, hold off antibiotics for now.  Chronic hypotension - Blood pressure appears to be at baseline, continue midodrine  - Check TSH and cortisol level  History of seizure disorder - Continue Keppra , Tegretol  Neurontin  and Lamictal .  Mother reported no seizure activity at home and patient has been compliant with her medications  Worsening of chronic normocytic anemia History of folate acid deficiency - Suspect malabsorption - On folic supplement - Recheck iron study  COPD - Stable  Total time spent on patient care 75 minutes. DVT prophylaxis: Eliquis  Code Status: Full code Family Communication: Mother at bedside Disposition Plan: Expect less than 2 midnight hospital Consults called: None Admission status: Telemetry observation   Cort ONEIDA Mana MD [facility] Pager   06/09/2024, 3:38 PM

## 2024-06-09 NOTE — ED Provider Notes (Signed)
 [facility] Provider Note    Event Date/Time   First MD Initiated Contact with Patient 06/09/24 1036     (approximate)   History   Fluid  Retention   HPI  Abigail [name] is a 58 y.o. 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  There was a concern initially from family of altered mental status that she is more confused but with EMS she was alert and oriented x 4.  Patient was recently taken off of blood thinners but family were concerned about some increasing fluid retention.  Patient was hypoxic in the 80s so placed on 2 L.  She really denied any chest pain but did report some shortness of breath.  She states that she does not typically use oxygen . I reviewed the office visit from yesterday with Dr. Babara where patient had been on Eliquis  and they had recommended her stop taking it as of yesterday.  This was secondary to her having multiple falls  Physical Exam   Triage Vital Signs: ED Triage Vitals  Encounter Vitals Group     BP      Girls Systolic BP Percentile      Girls Diastolic BP Percentile      Boys Systolic BP Percentile      Boys Diastolic BP Percentile      Pulse      Resp      Temp      Temp src      SpO2      Weight      Height      Head Circumference      Peak Flow      Pain Score      Pain Loc      Pain Education      Exclude from Growth Chart     Most recent vital signs: Vitals:   06/09/24 1040 06/09/24 1130  BP: 124/68 112/66  Pulse: 96 92  Resp: (!) 28 15  Temp: 99 F (37.2 C)   SpO2: 100% 99%     General: Awake, no distress.  CV:  Good peripheral perfusion.  Resp:  Normal effort. No wheezing  Abd:  No distention.  Soft nontender Other:  Edema noted bilaterally   ED Results / Procedures / Treatments   Labs (all labs ordered are listed, but only abnormal results are displayed) Labs Reviewed  COMPREHENSIVE METABOLIC PANEL WITH GFR  - Abnormal; Notable for the following components:      Result Value   Chloride 96 (*)    Calcium  8.1 (*)    Albumin 2.4 (*)    Alkaline Phosphatase 146 (*)    All other components within normal limits  BRAIN NATRIURETIC PEPTIDE  URINALYSIS, ROUTINE W REFLEX MICROSCOPIC  CBC  TROPONIN I (HIGH SENSITIVITY)  TROPONIN I (HIGH SENSITIVITY)     EKG  My interpretation of EKG:  Normal sinus with a rate of 93 without any ST elevation or T wave inversions, normal intervals  RADIOLOGY I have reviewed the xray personally and interpreted patient has edema noted bilaterally   PROCEDURES:  Critical Care performed: Yes, see critical care procedure note(s)  .1-3 Lead EKG Interpretation  Performed by: Ernest Ronal BRAVO, MD Authorized by: Ernest Ronal BRAVO, MD     Interpretation: normal     ECG rate:  90   ECG rate assessment: normal     Rhythm: sinus rhythm  Ectopy: none     Conduction: normal   .Critical Care  Performed by: Ernest Ronal BRAVO, MD Authorized by: Ernest Ronal BRAVO, MD   Critical care provider statement:    Critical care time (minutes):  30   Critical care was necessary to treat or prevent imminent or life-threatening deterioration of the following conditions:  Respiratory failure   Critical care was time spent personally by me on the following activities:  Development of treatment plan with patient or surrogate, discussions with consultants, evaluation of patient's response to treatment, examination of patient, ordering and review of laboratory studies, ordering and review of radiographic studies, ordering and performing treatments and interventions, pulse oximetry, re-evaluation of patient's condition and review of old charts    MEDICATIONS ORDERED IN ED: Medications  furosemide  (LASIX ) injection 40 mg (has no administration in time range)  albumin human 25 % solution 25 g (has no administration in time range)  acetaminophen  (TYLENOL ) tablet 1,000 mg (has no administration in  time range)  midodrine  (PROAMATINE ) tablet 5 mg (has no administration in time range)  DULoxetine  (CYMBALTA ) DR capsule 60 mg (has no administration in time range)  mirtazapine  (REMERON ) tablet 7.5 mg (has no administration in time range)  pantoprazole  (PROTONIX ) EC tablet 40 mg (has no administration in time range)  Melatonin TABS 10 mg (has no administration in time range)  carbamazepine  (TEGRETOL  XR) 12 hr tablet 100 mg (has no administration in time range)  gabapentin  (NEURONTIN ) capsule 300 mg (has no administration in time range)  lamoTRIgine  (LAMICTAL ) tablet 50 mg (has no administration in time range)  levETIRAcetam  (KEPPRA ) tablet 1,500 mg (has no administration in time range)  albuterol  (VENTOLIN  HFA) 108 (90 Base) MCG/ACT inhaler 2 puff (has no administration in time range)  sodium chloride  flush (NS) 0.9 % injection 3 mL (has no administration in time range)  sodium chloride  flush (NS) 0.9 % injection 3 mL (has no administration in time range)  0.9 %  sodium chloride  infusion (has no administration in time range)  ondansetron  (ZOFRAN ) injection 4 mg (has no administration in time range)  apixaban  (ELIQUIS ) tablet 5 mg (has no administration in time range)  furosemide  (LASIX ) injection 40 mg (has no administration in time range)     IMPRESSION / MDM / ASSESSMENT AND PLAN / ED COURSE  I reviewed the triage vital signs and the nursing notes.   Patient's presentation is most consistent with acute presentation with potential threat to life or bodily function.   Patient comes in with concerns for fluid overload.  She does have edema noted in her legs and she was satting in the 80s so placed on 2 L.  X-ray does confirm fluid I doubt this is a PE given she was just taken off the medication yesterday.  Workup was done to evaluate for Electra abnormalities.  Patient will require admission to the hospital due to hypoxia.  Patient's CBC shows hemoglobin around baseline white count is  normal.  CMP is reassuring troponin was negative.  Given patient's hypoxia with edema on chest x-ray will discuss with hospital team for admission  1120 reevaluated patient she had been taken off the oxygen  to go to the bathroom she was hypoxic into the 70s so placed back on her 4 L of oxygen .  I did add on VBG.  I tried to cancel the hospitalist consult but it had already gone through and Dr. Laurita was okay with following up with VBG this had resulted as normal.  The patient is on the cardiac monitor to evaluate for evidence of arrhythmia and/or significant heart rate changes.      FINAL CLINICAL IMPRESSION(S) / ED DIAGNOSES   Final diagnoses:  Acute on chronic congestive heart failure, unspecified heart failure type (HCC)  Acute respiratory failure with hypoxia (HCC)     Rx / DC Orders   ED Discharge Orders     None        Note:  This document was prepared using Dragon voice recognition software and may include unintentional dictation errors.   Ernest Ronal BRAVO, MD 06/09/24 [phone]

## 2024-06-09 NOTE — ED Triage Notes (Addendum)
 Pt arrived via EMS from home due to AMS. Per EMS pt family sts that pt is confused however pt is A/Ox4 per EMS. Pt has a hx of blood clots even though pt was taken off her blood thinners. Pt has an increased level of fluid retention.

## 2024-06-09 NOTE — ED Notes (Signed)
 Difficult stick. RN called phlebotomy to come and collect. MD notified.

## 2024-06-09 NOTE — Progress Notes (Signed)
 Iron sat 7%, no Hx of GI bleeding, clinically suspect malobsorption. One dose of Venofer ordered.  FOBT ordered.

## 2024-06-09 NOTE — Addendum Note (Signed)
 Addended by: BARI FONDA MATSU on: 06/09/2024 09:14 AM   Modules accepted: Orders

## 2024-06-10 ENCOUNTER — Observation Stay

## 2024-06-10 DIAGNOSIS — R0989 Other specified symptoms and signs involving the circulatory and respiratory systems: Secondary | ICD-10-CM | POA: Diagnosis not present

## 2024-06-10 DIAGNOSIS — D649 Anemia, unspecified: Secondary | ICD-10-CM | POA: Diagnosis present

## 2024-06-10 DIAGNOSIS — R296 Repeated falls: Secondary | ICD-10-CM | POA: Diagnosis present

## 2024-06-10 DIAGNOSIS — I9589 Other hypotension: Secondary | ICD-10-CM | POA: Diagnosis present

## 2024-06-10 DIAGNOSIS — E785 Hyperlipidemia, unspecified: Secondary | ICD-10-CM | POA: Diagnosis present

## 2024-06-10 DIAGNOSIS — Z886 Allergy status to analgesic agent status: Secondary | ICD-10-CM | POA: Diagnosis not present

## 2024-06-10 DIAGNOSIS — Z87891 Personal history of nicotine dependence: Secondary | ICD-10-CM | POA: Diagnosis not present

## 2024-06-10 DIAGNOSIS — J449 Chronic obstructive pulmonary disease, unspecified: Secondary | ICD-10-CM | POA: Diagnosis present

## 2024-06-10 DIAGNOSIS — Z7901 Long term (current) use of anticoagulants: Secondary | ICD-10-CM | POA: Diagnosis not present

## 2024-06-10 DIAGNOSIS — Z885 Allergy status to narcotic agent status: Secondary | ICD-10-CM | POA: Diagnosis not present

## 2024-06-10 DIAGNOSIS — M797 Fibromyalgia: Secondary | ICD-10-CM | POA: Diagnosis present

## 2024-06-10 DIAGNOSIS — E8770 Fluid overload, unspecified: Secondary | ICD-10-CM | POA: Diagnosis not present

## 2024-06-10 DIAGNOSIS — I509 Heart failure, unspecified: Secondary | ICD-10-CM | POA: Diagnosis not present

## 2024-06-10 DIAGNOSIS — Z90710 Acquired absence of both cervix and uterus: Secondary | ICD-10-CM | POA: Diagnosis not present

## 2024-06-10 DIAGNOSIS — R918 Other nonspecific abnormal finding of lung field: Secondary | ICD-10-CM | POA: Diagnosis not present

## 2024-06-10 DIAGNOSIS — G9341 Metabolic encephalopathy: Secondary | ICD-10-CM | POA: Diagnosis present

## 2024-06-10 DIAGNOSIS — Z981 Arthrodesis status: Secondary | ICD-10-CM | POA: Diagnosis not present

## 2024-06-10 DIAGNOSIS — J9811 Atelectasis: Secondary | ICD-10-CM | POA: Diagnosis not present

## 2024-06-10 DIAGNOSIS — I5033 Acute on chronic diastolic (congestive) heart failure: Secondary | ICD-10-CM | POA: Diagnosis present

## 2024-06-10 DIAGNOSIS — K909 Intestinal malabsorption, unspecified: Secondary | ICD-10-CM | POA: Diagnosis present

## 2024-06-10 DIAGNOSIS — Z833 Family history of diabetes mellitus: Secondary | ICD-10-CM | POA: Diagnosis not present

## 2024-06-10 DIAGNOSIS — Z85828 Personal history of other malignant neoplasm of skin: Secondary | ICD-10-CM | POA: Diagnosis not present

## 2024-06-10 DIAGNOSIS — G40909 Epilepsy, unspecified, not intractable, without status epilepticus: Secondary | ICD-10-CM | POA: Diagnosis present

## 2024-06-10 DIAGNOSIS — Z8249 Family history of ischemic heart disease and other diseases of the circulatory system: Secondary | ICD-10-CM | POA: Diagnosis not present

## 2024-06-10 DIAGNOSIS — K861 Other chronic pancreatitis: Secondary | ICD-10-CM | POA: Diagnosis present

## 2024-06-10 DIAGNOSIS — E8809 Other disorders of plasma-protein metabolism, not elsewhere classified: Secondary | ICD-10-CM | POA: Diagnosis present

## 2024-06-10 DIAGNOSIS — Z86711 Personal history of pulmonary embolism: Secondary | ICD-10-CM | POA: Diagnosis not present

## 2024-06-10 DIAGNOSIS — J9601 Acute respiratory failure with hypoxia: Secondary | ICD-10-CM | POA: Diagnosis present

## 2024-06-10 LAB — GASTROINTESTINAL PANEL BY PCR, STOOL (REPLACES STOOL CULTURE)

## 2024-06-10 LAB — PROCALCITONIN: Procalcitonin: <0.1 ng/mL

## 2024-06-10 LAB — BASIC METABOLIC PANEL WITH GFR
Anion gap: 8 (ref 5–15)
BUN: 9 mg/dL (ref 6–20)
CO2: 33 mmol/L — ABNORMAL HIGH (ref 22–32)
Calcium: 7.9 mg/dL — ABNORMAL LOW (ref 8.9–10.3)
Chloride: 101 mmol/L (ref 98–111)
Creatinine, Ser: 0.65 mg/dL (ref 0.44–1.00)
GFR, Estimated: >60 mL/min (ref >60)
Glucose, Bld: 81 mg/dL (ref 70–99)
Potassium: 3.2 mmol/L — ABNORMAL LOW (ref 3.5–5.1)
Sodium: 142 mmol/L (ref 135–145)

## 2024-06-10 LAB — LACTIC ACID, PLASMA: Lactic Acid, Venous: 0.8 mmol/L (ref 0.5–1.9)

## 2024-06-10 LAB — RETICULOCYTES
Immature Retic Fract: 28.6 % — ABNORMAL HIGH (ref 2.3–15.9)
RBC.: 2.71 MIL/uL — ABNORMAL LOW (ref 3.87–5.11)
Retic Count, Absolute: 74.3 K/uL (ref 19.0–186.0)
Retic Ct Pct: 2.7 % (ref 0.4–3.1)

## 2024-06-10 LAB — OCCULT BLOOD X 1 CARD TO LAB, STOOL: Fecal Occult Bld: NEGATIVE

## 2024-06-10 LAB — HAPTOGLOBIN: Haptoglobin: 184 mg/dL (ref 33–346)

## 2024-06-10 MED ORDER — ALBUMIN HUMAN 25 % IV SOLN
25.0000 g | Freq: Once | INTRAVENOUS | Status: AC
  Administered 2024-06-10: 25 g via INTRAVENOUS
  Filled 2024-06-10: qty 100

## 2024-06-10 MED ORDER — BOOST / RESOURCE BREEZE PO LIQD CUSTOM
1.0000 | Freq: Three times a day (TID) | ORAL | Status: DC
  Administered 2024-06-10 – 2024-06-12 (×7): 1 via ORAL

## 2024-06-10 MED ORDER — ENOXAPARIN SODIUM 40 MG/0.4ML IJ SOSY
40.0000 mg | PREFILLED_SYRINGE | INTRAMUSCULAR | Status: DC
  Administered 2024-06-10 – 2024-06-11 (×2): 40 mg via SUBCUTANEOUS
  Filled 2024-06-10 (×2): qty 0.4

## 2024-06-10 MED ORDER — POTASSIUM CHLORIDE 20 MEQ PO PACK
40.0000 meq | PACK | Freq: Two times a day (BID) | ORAL | Status: AC
  Administered 2024-06-10 (×2): 40 meq via ORAL
  Filled 2024-06-10 (×2): qty 2

## 2024-06-10 NOTE — Consult Note (Signed)
 WOC Nurse Consult Note: Reason for Consult: chronic leg wounds  Wound type: none Unna's boots or kerlix/coban wraps on the legs for edema control.  Needs compression stockings, WOC will re-evaluate for compression wraps/stockings in the am after she is admitted.   [place] Rico [facility], CNS, CWON-AP [phone]

## 2024-06-10 NOTE — ED Notes (Signed)
 Assisted pt onto the bedside commode. Changed pt into clean brief and put the pure wick back in place.

## 2024-06-10 NOTE — Progress Notes (Signed)
 Progress Note   Patient: Abigail [name] [facility] FMW:[id number] DOB: 1966/06/08 DOA: 06/09/2024     0 DOS: the patient was seen and examined on 06/10/2024   Brief hospital course: Shakisha [facility] is a 58 y.o. 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   Mother at bedside gave history.  Family reported that patient has been  building up fluid for 3 to 4 weeks, mother with patient every day and found her weight  fluctuate, 3 days ago she was started on diuresis, but patient continued to have fluid buildup, especially her legs, with fluid weeping out and ulcers develop.  She also started develop exertional dyspnea.  Mother also reported that patient has had multiple loose diarrhea since last night.   ED Course: Afebrile, blood pressure 124/68 O2 saturation 99% on 4 L.  Chest x-ray showed pulmonary congestion with right lower lobe atelectasis.  Blood work showed albumin 2.4 sodium 136 potassium 4.0 BUN 9 creatinine 0.6 glucose 97, VBG 7.40/58/40.   Patient was given Lasix  x 1 in the ED.  Assessment and Plan:   CHF (congestive heart failure) (HCC)  (please populate well all problems here in Problem List. (For example, if patient is on BP meds at home and you resume or decide to hold them, it is a problem that needs to be her. Same for CAD, COPD, HLD and so on)   Fluid overload Worsening of chronic hypoalbuminemia - No clear etiology, patient has a normal echo 2 months ago. 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  As other etiology of hypoalbuminemia are unlikely, recent imaging study CT abdomen showed no liver cirrhosis, UA showed no proteinuria.  And CT abdomen showed patient has chronic pancreatitis with  pseudocysts formation. - Will give albumin x 3 doses to enhance diuresis - Start Creon  - Check prealbumin level Continue IV Lasix    Acute metabolic encephalopathy-improved - Unclear etiology.  VBG showed normal pH and pCO2 - CT scan of the brain did not show any acute pathology - Avoid sedation medications - Other DDx, there was no cough no leukocytosis or fever, x-ray finding more compatible with fluid overload, will check UA and procalcitonin and lactic acid, hold off antibiotics for now.   Chronic hypotension - Blood pressure appears to be at baseline, continue midodrine  - Check TSH and cortisol level   History of seizure disorder - Continue Keppra , Tegretol  Neurontin  and Lamictal .  Mother reported no seizure activity at home and patient has been compliant with her medications   Worsening of chronic normocytic anemia History of folate acid deficiency - Suspect malabsorption - On folic supplement - Recheck iron study   COPD - Stable  DVT prophylaxis: Eliquis  Code Status: Full code  Subjective:  Patient seen and examined at bedside this morning Currently on 4 L of intranasal oxygen  Denies chest pain nausea vomiting abdominal pain  Physical Exam: Eyes: PERRL, lids and conjunctivae normal ENMT: Mucous membranes are moist. Posterior pharynx clear of any exudate or lesions.Normal dentition.  Neck: normal, supple, no masses, no thyromegaly Respiratory: clear to auscultation bilaterally, no wheezing, bilateral fine crackles, increasing breathing effort. No accessory muscle use.  Cardiovascular: Regular rate and rhythm, no murmurs / rubs / gallops.  Anasarca to bilateral upper thighs. 2+ pedal pulses. No carotid bruits.  Abdomen: no tenderness, no masses palpated. No hepatosplenomegaly. Bowel sounds positive.  Musculoskeletal: no clubbing / cyanosis. No joint deformity upper and lower extremities. Good ROM, no contractures. Normal muscle tone.  Skin: no rashes, lesions, ulcers.  No induration Neurologic: No facial droops, moving limbs Psychiatric: Lethargic, arousable, confused.     Vitals:   06/10/24 1030 06/10/24 1100 06/10/24 1432 06/10/24 1500  BP: (!) 108/57 116/64  (!) 95/47  Pulse: 80 95  77  Resp: 13 13  15   Temp:   98.3 F (36.8 C)   TempSrc:   Oral   SpO2: 99% 94%  93%  Weight:      Height:        Data Reviewed:     Latest Ref Rng & Units 06/09/2024    2:48 PM 06/08/2024    4:12 PM 05/06/2024    5:10 AM  CBC  WBC 4.0 - 10.5 K/uL 8.4  10.6  8.7   Hemoglobin 12.0 - 15.0 g/dL 8.1  9.2  8.9   Hematocrit 36.0 - 46.0 % 27.2  30.0  28.7   Platelets 150 - 400 K/uL 265  306  253        Latest Ref Rng & Units 06/10/2024    4:26 AM 06/09/2024   11:58 AM 05/13/2024   11:18 AM  BMP  Glucose 70 - 99 mg/dL 81  97  73   BUN 6 - 20 mg/dL 9  9  11    Creatinine 0.44 - 1.00 mg/dL 9.34  9.38  9.29   BUN/Creat Ratio 9 - 23   16   Sodium 135 - 145 mmol/L 142  136  135   Potassium 3.5 - 5.1 mmol/L 3.2  4.0  3.9   Chloride 98 - 111 mmol/L 101  96  92   CO2 22 - 32 mmol/L 33  29  30   Calcium  8.9 - 10.3 mg/dL 7.9  8.1  8.4   I have reviewed patient's chest x-ray showing bilateral opacities concerning for interstitial edema  Time spent: 52 minutes  Author: Drue ONEIDA Potter, MD 06/10/2024 4:39 PM  For on call review [email or web address].

## 2024-06-10 NOTE — ED Notes (Signed)
 Delayed in administering Carbamazepine  due to it being outside of this providers scope of practice. Advised the charge nurse and the nurse assigned to aide with the medications

## 2024-06-11 DIAGNOSIS — E8770 Fluid overload, unspecified: Secondary | ICD-10-CM | POA: Diagnosis not present

## 2024-06-11 LAB — CBC WITH DIFFERENTIAL/PLATELET
Abs Immature Granulocytes: 0.02 K/uL (ref 0.00–0.07)
Basophils Absolute: 0.1 K/uL (ref 0.0–0.1)
Basophils Relative: 1 %
Eosinophils Absolute: 0.7 K/uL — ABNORMAL HIGH (ref 0.0–0.5)
Eosinophils Relative: 9 %
HCT: 28 % — ABNORMAL LOW (ref 36.0–46.0)
Hemoglobin: 8.3 g/dL — ABNORMAL LOW (ref 12.0–15.0)
Immature Granulocytes: 0 %
Lymphocytes Relative: 27 %
Lymphs Abs: 1.9 K/uL (ref 0.7–4.0)
MCH: 27.4 pg (ref 26.0–34.0)
MCHC: 29.6 g/dL — ABNORMAL LOW (ref 30.0–36.0)
MCV: 92.4 fL (ref 80.0–100.0)
Monocytes Absolute: 0.9 K/uL (ref 0.1–1.0)
Monocytes Relative: 13 %
Neutro Abs: 3.6 K/uL (ref 1.7–7.7)
Neutrophils Relative %: 50 %
Platelets: 262 K/uL (ref 150–400)
RBC: 3.03 MIL/uL — ABNORMAL LOW (ref 3.87–5.11)
RDW: 15.7 % — ABNORMAL HIGH (ref 11.5–15.5)
WBC: 7.2 K/uL (ref 4.0–10.5)
nRBC: 0 % (ref 0.0–0.2)

## 2024-06-11 LAB — BASIC METABOLIC PANEL WITH GFR
Anion gap: 13 (ref 5–15)
BUN: 9 mg/dL (ref 6–20)
CO2: 33 mmol/L — ABNORMAL HIGH (ref 22–32)
Calcium: 7.6 mg/dL — ABNORMAL LOW (ref 8.9–10.3)
Chloride: 95 mmol/L — ABNORMAL LOW (ref 98–111)
Creatinine, Ser: 0.65 mg/dL (ref 0.44–1.00)
GFR, Estimated: >60 mL/min (ref >60)
Glucose, Bld: 113 mg/dL — ABNORMAL HIGH (ref 70–99)
Potassium: 3.2 mmol/L — ABNORMAL LOW (ref 3.5–5.1)
Sodium: 141 mmol/L (ref 135–145)

## 2024-06-11 MED ORDER — POTASSIUM CHLORIDE CRYS ER 20 MEQ PO TBCR
40.0000 meq | EXTENDED_RELEASE_TABLET | Freq: Two times a day (BID) | ORAL | Status: AC
  Administered 2024-06-11 (×2): 40 meq via ORAL
  Filled 2024-06-11 (×2): qty 2

## 2024-06-11 NOTE — Plan of Care (Signed)
   Problem: Education: Goal: Knowledge of General Education information will improve Description Including pain rating scale, medication(s)/side effects and non-pharmacologic comfort measures Outcome: Progressing

## 2024-06-11 NOTE — Consult Note (Signed)
 WOC Nurse wound follow up Wound type: none WOC was consulted 10/22 for LE wounds, when Unna's boots removed in the ED. Patient does not have LE wounds.  Apparently may have been applied outpatient for LE edema management. Notified MD, will not reorder at this time.  HHRN can reapply however most HH agencies will not apply for edema management only without the treatment for LE wounds.   Recommend patient be measured and fitted for therapeutic compression hose/stockings 15-64mmHG as an outpatient, can be done in DME provider of patient's choice in areas that she resides.   Bralin Garry [facility], CNS, CWON-AP [phone]

## 2024-06-11 NOTE — Progress Notes (Signed)
 Progress Note   Patient: Abigail [name] DOB: 04-13-1966 DOA: 06/09/2024     1 DOS: the patient was seen and examined on 06/11/2024    Brief hospital course: From HPI Abigail [name] [facility] is a 58 y.o. 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   Mother at bedside gave history.  Family reported that patient has been  building up fluid for 3 to 4 weeks, mother with patient every day and found her weight  fluctuate, 3 days ago she was started on diuresis, but patient continued to have fluid buildup, especially her legs, with fluid weeping out and ulcers develop.  She also started develop exertional dyspnea.  Mother also reported that patient has had multiple loose diarrhea since last night.   ED Course: Afebrile, blood pressure 124/68 O2 saturation 99% on 4 L.  Chest x-ray showed pulmonary congestion with right lower lobe atelectasis.  Blood work showed albumin 2.4 sodium 136 potassium 4.0 BUN 9 creatinine 0.6 glucose 97, VBG 7.40/58/40.   Patient was given Lasix  x 1 in the ED.    Assessment and Plan:    CHF (congestive heart failure) (HCC)  (please populate well all problems here in Problem List. (For example, if patient is on BP meds at home and you resume or decide to hold them, it is a problem that needs to be her. Same for CAD, COPD, HLD and so on)   Fluid overload Worsening of chronic hypoalbuminemia - No clear etiology, patient has a normal echo 2 months ago. 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  As other etiology of hypoalbuminemia are unlikely, recent imaging study CT abdomen showed no liver cirrhosis, UA showed no proteinuria.  And CT abdomen showed patient has chronic  pancreatitis with pseudocysts formation. - Patient received IV albumin Continue Creon  Continue IV Lasix    Acute metabolic encephalopathy-improved - Unclear etiology.  VBG showed normal pH and pCO2 - CT scan of the brain did not show any acute pathology Avoid sedating medication   Chronic hypotension - Blood pressure appears to be at baseline Continue midodrine    History of seizure disorder - Continue Keppra , Tegretol  Neurontin  and Lamictal .  Mother reported no seizure activity at home and patient has been compliant with her medications   Worsening of chronic normocytic anemia History of folate acid deficiency - Suspect malabsorption - On folic supplement - Follow-up on iron study   COPD Continue as needed nebulization   DVT prophylaxis: Eliquis  Code Status: Full code   Subjective:  Patient seen and examined at bedside this morning Respiratory function improved today and oxygen  requirement improved from 4 L to 2 L today Denies chest pain nausea vomiting   Physical Exam: Eyes: PERRL, lids and conjunctivae normal ENMT: Mucous membranes are moist. Posterior pharynx clear of any exudate or lesions.Normal dentition.  Neck: normal, supple, no masses, no thyromegaly Respiratory: Crackles improved Cardiovascular: Regular rate and rhythm, no murmurs / rubs / gallops.  Lower extremity edema noted Abdomen: no tenderness, no masses palpated. No hepatosplenomegaly. Bowel sounds positive.  Musculoskeletal: no clubbing / cyanosis. No joint deformity upper and lower extremities. Good ROM, no contractures. Normal muscle tone.  Skin: no rashes, lesions, ulcers. No induration Neurologic: No facial droops, moving limbs Psychiatric: Lethargic, arousable, confused.    Data  Reviewed:      Latest Ref Rng & Units 06/11/2024    4:06 AM 06/09/2024    2:48 PM 06/08/2024    4:12 PM  CBC  WBC 4.0 - 10.5 K/uL 7.2  8.4  10.6   Hemoglobin 12.0 - 15.0 g/dL 8.3  8.1  9.2   Hematocrit 36.0 - 46.0  % 28.0  27.2  30.0   Platelets 150 - 400 K/uL 262  265  306        Latest Ref Rng & Units 06/11/2024    4:06 AM 06/10/2024    4:26 AM 06/09/2024   11:58 AM  BMP  Glucose 70 - 99 mg/dL 886  81  97   BUN 6 - 20 mg/dL 9  9  9    Creatinine 0.44 - 1.00 mg/dL 9.34  9.34  9.38   Sodium 135 - 145 mmol/L 141  142  136   Potassium 3.5 - 5.1 mmol/L 3.2  3.2  4.0   Chloride 98 - 111 mmol/L 95  101  96   CO2 22 - 32 mmol/L 33  33  29   Calcium  8.9 - 10.3 mg/dL 7.6  7.9  8.1     Vitals:   06/11/24 0858 06/11/24 1000 06/11/24 1103 06/11/24 1554  BP: (!) 105/59  101/61 (!) 103/59  Pulse: 78  81 82  Resp: 16 20 18 16   Temp: 98.2 F (36.8 C)  98.7 F (37.1 C) 98.1 F (36.7 C)  TempSrc: Oral  Oral Oral  SpO2: 94%  93% 94%  Weight:      Height:         Author: Drue ONEIDA Potter, MD 06/11/2024 5:44 PM  For on call review [email or web address].

## 2024-06-11 NOTE — TOC Initial Note (Signed)
 Transition of Care [facility]) - Initial/Assessment Note    Patient Details  Name: Abigail [name] MRN: [id number] Date of Birth: 1965-10-24  Transition of Care [facility]) CM/SW Contact:    Lauraine JAYSON Carpen, LCSW Phone Number: 06/11/2024, 12:37 PM  Clinical Narrative: Readmission prevention screen complete. CSW met with patient. No family at bedside. CSW introduced role and explained that discharge planning would be discussed. PCP is [name], MD. Mom or friend drive her to appointments. [facility] [place]. No issues affording medications. Patient is currently staying with her mother. When she is home, her son stays with her. She is active with [facility] for PT and OT. She has a RW, rollator, 3-in-1, and home oxygen  that she uses prn ([employer]). No further concerns. CSW will continue to follow patient for support and facilitate return home once stable. Her friend will transport her home at discharge and bring oxygen  for the ride.                 Expected Discharge Plan: Home w Home Health Services Barriers to Discharge: Continued Medical Work up   Patient Goals and CMS Choice            Expected Discharge Plan and Services     Post Acute Care Choice: Resumption of Svcs/PTA Provider Living arrangements for the past 2 months: Single Family Home                           HH Arranged: PT, OT HH Agency: [facility] Date [facility] Agency Contacted: 06/11/24   Representative spoke with at [facility] Agency: Larraine  Prior Living Arrangements/Services Living arrangements for the past 2 months: Single Family Home Lives with:: Parents Patient language and need for interpreter reviewed:: Yes Do you feel safe going back to the place where you live?: Yes      Need for Family Participation in Patient Care: Yes (Comment) Care giver support system in place?: Yes (comment) Current home services: DME, Home OT, Home PT Criminal Activity/Legal Involvement Pertinent to Current  Situation/Hospitalization: No - Comment as needed  Activities of Daily Living   ADL Screening (condition at time of admission) Independently performs ADLs?: Yes (appropriate for developmental age) Is the patient deaf or have difficulty hearing?: No Does the patient have difficulty seeing, even when wearing glasses/contacts?: No Does the patient have difficulty concentrating, remembering, or making decisions?: No  Permission Sought/Granted Permission sought to share information with : Facility Industrial/product designer granted to share information with : Yes, Verbal Permission Granted     Permission granted to share info w AGENCY: [facility]        Emotional Assessment Appearance:: Appears stated age Attitude/Demeanor/Rapport: Engaged, Gracious Affect (typically observed): Accepting, Appropriate, Calm, Pleasant Orientation: : Oriented to Self, Oriented to Place, Oriented to  Time, Oriented to Situation Alcohol / Substance Use: Not Applicable Psych Involvement: No (comment)  Admission diagnosis:  CHF (congestive heart failure) (HCC) [I50.9] Acute respiratory failure with hypoxia (HCC) [J96.01] Acute on chronic congestive heart failure, unspecified heart failure type [facility]) [I50.9] Patient Active Problem List   Diagnosis Date Noted   Macrocytic anemia 06/08/2024   Normocytic anemia 06/08/2024   Low folate 05/13/2024   Cellulitis of lower extremity 05/03/2024   History of pulmonary embolus (PE) 05/03/2024   Seizure disorder (HCC) 05/03/2024   GERD without esophagitis 05/03/2024   Chronic obstructive pulmonary disease (COPD) (HCC) 05/03/2024   Low blood  pressure 04/28/2024   Macrocytosis 04/28/2024   Malnutrition of moderate degree 04/06/2024   Hypoalbuminemia 04/04/2024   Hypocalcemia 04/04/2024   CHF (congestive heart failure) (HCC) 04/04/2024   Pelvic pain 03/25/2024   Hypovolemia 03/19/2024   Venous stasis ulcers (HCC) 03/11/2024   Fatigue 03/04/2024    Frequent falls 03/04/2024   Cachexia 02/11/2024   Pulmonary embolism and infarction (HCC) 01/02/2024   COPD (chronic obstructive pulmonary disease) (HCC) 12/29/2023   Peripheral edema 12/24/2023   Hypothyroidism 11/02/2023   Hyponatremia 09/27/2023   Chronic pancreatitis (HCC) 07/30/2023   Chronic respiratory failure with hypoxia (HCC) 07/29/2023   Pancreatic pseudocyst 07/13/2023   History of seizure 06/28/2023   Epilepsy, (HCC) 06/28/2023   Osteopenia 05/29/2023   Giant cell arteritis (HCC) 12/19/2022   DNR (do not resuscitate) 12/19/2022   Hypokalemia 12/08/2022   Acute metabolic encephalopathy 12/07/2022   Elevated random blood glucose level 08/12/2022   Idiopathic acute pancreatitis without infection or necrosis 08/08/2022   Tobacco use disorder 08/08/2022   Asthma 04/26/2020   Drug-induced constipation 04/26/2020   GERD (gastroesophageal reflux disease) 11/12/2016   Chronic prescription opiate use - thru [facility] in [place], [place] 04/29/2016   Long term current use of immunosuppressive drug 04/29/2016   Depression with anxiety 02/16/2013   Anxiety 02/13/2013   DDD (degenerative disc disease), cervical 02/13/2013   PCP:  [name], Susan K, MD Pharmacy:   [phone] [place] [place] - [address] [place] [address] Phone: [phone] Fax: [phone]  [name] [address] [place] [address] Phone: [phone] Fax: [phone]  [facility] [address] [place] [address] Phone: [phone] Fax: [phone]     Social Drivers of Health (SDOH) Social History: SDOH Screenings   Food Insecurity: No Food Insecurity (06/09/2024)  Housing: Low Risk  (06/09/2024)  Transportation Needs: No Transportation Needs (06/09/2024)  Utilities: Not At Risk (06/09/2024)  Depression (PHQ2-9): Medium Risk (06/09/2024)  Financial Resource Strain: Medium Risk  (06/09/2024)  Physical Activity: Inactive (01/27/2024)   Received from [facility]  Social Connections: Moderately Isolated (01/27/2024)   [facility] from [facility]  Stress: Stress Concern Present (06/09/2024)  Tobacco Use: High Risk (06/09/2024)  Health Literacy: Adequate Health Literacy (06/09/2024)   SDOH Interventions:     Readmission Risk Interventions    06/11/2024   12:35 PM 04/06/2024    2:40 PM 12/31/2023    3:36 PM  Readmission Risk Prevention Plan  Transportation Screening Complete Complete   Medication Review (RN Care Manager) Complete Complete   PCP or Specialist appointment within 3-5 days of discharge Complete Complete Complete  HRI or Home Care Consult Complete Complete Complete  SW Recovery Care/Counseling Consult Complete Complete Complete  Palliative Care Screening Not Applicable Not Applicable Not Applicable  Skilled Nursing Facility Not Applicable Not Applicable Not Complete  SNF Comments   Will follow up tomorrow after PT works with her again.

## 2024-06-12 ENCOUNTER — Other Ambulatory Visit: Payer: Self-pay

## 2024-06-12 DIAGNOSIS — E8770 Fluid overload, unspecified: Secondary | ICD-10-CM | POA: Diagnosis not present

## 2024-06-12 LAB — CBC WITH DIFFERENTIAL/PLATELET
Abs Immature Granulocytes: 0.03 K/uL (ref 0.00–0.07)
Basophils Absolute: 0.1 K/uL (ref 0.0–0.1)
Basophils Relative: 1 %
Eosinophils Absolute: 0.6 K/uL — ABNORMAL HIGH (ref 0.0–0.5)
Eosinophils Relative: 9 %
HCT: 31.1 % — ABNORMAL LOW (ref 36.0–46.0)
Hemoglobin: 9.4 g/dL — ABNORMAL LOW (ref 12.0–15.0)
Immature Granulocytes: 1 %
Lymphocytes Relative: 32 %
Lymphs Abs: 2 K/uL (ref 0.7–4.0)
MCH: 28 pg (ref 26.0–34.0)
MCHC: 30.2 g/dL (ref 30.0–36.0)
MCV: 92.6 fL (ref 80.0–100.0)
Monocytes Absolute: 0.8 K/uL (ref 0.1–1.0)
Monocytes Relative: 13 %
Neutro Abs: 2.7 K/uL (ref 1.7–7.7)
Neutrophils Relative %: 44 %
Platelets: 260 K/uL (ref 150–400)
RBC: 3.36 MIL/uL — ABNORMAL LOW (ref 3.87–5.11)
RDW: 16 % — ABNORMAL HIGH (ref 11.5–15.5)
WBC: 6.2 K/uL (ref 4.0–10.5)
nRBC: 0 % (ref 0.0–0.2)

## 2024-06-12 LAB — BASIC METABOLIC PANEL WITH GFR
Anion gap: 9 (ref 5–15)
BUN: 7 mg/dL (ref 6–20)
CO2: 35 mmol/L — ABNORMAL HIGH (ref 22–32)
Calcium: 8.1 mg/dL — ABNORMAL LOW (ref 8.9–10.3)
Chloride: 101 mmol/L (ref 98–111)
Creatinine, Ser: 0.55 mg/dL (ref 0.44–1.00)
GFR, Estimated: >60 mL/min (ref >60)
Glucose, Bld: 107 mg/dL — ABNORMAL HIGH (ref 70–99)
Potassium: 3.5 mmol/L (ref 3.5–5.1)
Sodium: 145 mmol/L (ref 135–145)

## 2024-06-12 MED ORDER — LOPERAMIDE HCL 2 MG PO CAPS
2.0000 mg | ORAL_CAPSULE | ORAL | Status: DC | PRN
  Administered 2024-06-12: 2 mg via ORAL
  Filled 2024-06-12: qty 1

## 2024-06-12 MED ORDER — PANCRELIPASE (LIP-PROT-AMYL) 24000-76000 UNITS PO CPEP
24000.0000 [IU] | ORAL_CAPSULE | Freq: Three times a day (TID) | ORAL | 0 refills | Status: DC
  Filled 2024-06-12: qty 100, 34d supply, fill #0

## 2024-06-12 MED ORDER — DICLOFENAC SODIUM 1 % EX GEL
2.0000 g | Freq: Four times a day (QID) | CUTANEOUS | Status: DC
  Administered 2024-06-12: 2 g via TOPICAL
  Filled 2024-06-12: qty 100

## 2024-06-12 NOTE — Evaluation (Signed)
 Occupational Therapy Evaluation Patient Details Name: Abigail [name] [facility] MRN: [id number] DOB: 11-Nov-1965 Today's Date: 06/12/2024   History of Present Illness   Pt is a 58 y.o. female brought in by family member for evaluation of worsening of fluid overload and shortness of breath. Admitted for management of CHF, fluid overlaod and acute metabolic encephalopathy. PMH of PE on chronic Eliquis , chronic pancreatitis, IBS, seizure disorder, brought in by family member for evaluation of worsening of fluid overload and shortness of breath.     Clinical Impressions Pt was seen for OT evaluation this date. PTA, pt reports she was living with her mother and has all needed DME. Reports she has been working with [facility] therapy at home and has a little difficulty with LB dressing, but her mother or godson assists. Pt's godson lives in pt's home and is available to assist as needed. Pt reports she is supposed to wear 02 all the time at 2L, but rarely wears it at home. Pt presents with deficits in , affecting safe and optimal ADL completion. Pt currently requires MOD I for bed mobility and STS from EOB to RW. She ambulated 3 separate laps around the nurses station using RW with CGA/SBA. On RA, pt desat to 80% and on 2L desat to 90%. Pt demo LB dressing with independence. Edu on PLB, pacing, ECS, task modification, use of DME/AE/AD to maximize ease/safety/IND and prevent overexertion during ADLs and mobility. Edu on importance of wearing 02 with activity and risks of prolonged low 02 readings. Pt verbalized understanding. No further acute OT needs warranted and no OT follow up required. Would benefit from working with mobility team to walk during admission.     If plan is discharge home, recommend the following:   A little help with walking and/or transfers     Functional Status Assessment   Patient has not had a recent decline in their functional status     Equipment Recommendations   None  recommended by OT (has needed DME)     Recommendations for Other Services         Precautions/Restrictions   Precautions Precautions: Fall Recall of Precautions/Restrictions: Intact Precaution/Restrictions Comments: needs 02 for mobility Restrictions Weight Bearing Restrictions Per Provider Order: No     Mobility Bed Mobility Overal bed mobility: Modified Independent                  Transfers Overall transfer level: Modified independent Equipment used: Rolling walker (2 wheels)               General transfer comment: ambulated 3 laps around nursing station with RW and SBA, sp02 dropped to 80% on RA and 90% on 2L; edu on need for 2L 02 during ambuation/activity      Balance Overall balance assessment: Mild deficits observed, not formally tested                                         ADL either performed or assessed with clinical judgement   ADL Overall ADL's : Modified independent                                       General ADL Comments: able to doff/don socks with IND seated in recliner; edu provided on ECS, pacing, and use of DME/AE/AD  for ADL performance     Vision         Perception         Praxis         Pertinent Vitals/Pain Pain Assessment Pain Assessment: 0-10 Pain Score: 10-Worst pain ever Pain Location: all over Pain Descriptors / Indicators: Aching Pain Intervention(s): Monitored during session, Repositioned, Limited activity within patient's tolerance     Extremity/Trunk Assessment Upper Extremity Assessment Upper Extremity Assessment: Overall WFL for tasks assessed   Lower Extremity Assessment Lower Extremity Assessment: Overall WFL for tasks assessed       Communication Communication Communication: No apparent difficulties   Cognition Arousal: Alert Behavior During Therapy: WFL for tasks assessed/performed Cognition: No apparent impairments                                Following commands: Intact       Cueing  General Comments   Cueing Techniques: Verbal cues  desat to 80% on RA with ambulation around nurses station, desat to 90% on 2L during ambulation   Exercises Other Exercises Other Exercises: Edu on role of OT in acute setting, ECS, pacing, task modification, AE/AD/DME use and PLB to prevent overexertion as well as need for 02 with activity. Pt verbalized understanding.   Shoulder Instructions      Home Living Family/patient expects to be discharged to:: Private residence Living Arrangements: Parent Available Help at Discharge: Family;Available 24 hours/day (godson is at her house where she could go back) Type of Home: House Home Access: Ramped entrance;Stairs to enter Entrance Stairs-Number of Steps: 2 STE her mothers home, ramp if she DC to her house Entrance Stairs-Rails: Right Home Layout: One level     Bathroom Shower/Tub: Chief Strategy Officer: Handicapped height     Home Equipment: Agricultural consultant (2 wheels);Rollator (4 wheels);BSC/3in1;Electric scooter   Additional Comments: Pt currently lives with her mother, Her godson lives at her home and will be able to physically assist her if needed.      Prior Functioning/Environment Prior Level of Function : Needs assist       Physical Assist : ADLs (physical);Mobility (physical) Mobility (physical): Gait ADLs (physical): Dressing;Bathing Mobility Comments: amb with RW or rollator household distances- not strong enough recently for community mobility per her report; 5 falls in the last 6 months per her report ADLs Comments: MOD I for UB dressing, feeding, grooming, toileting; mom assists with LB dressing (cannot feel her feet), assist with IADLs as needed    OT Problem List: Decreased activity tolerance   OT Treatment/Interventions:        OT Goals(Current goals can be found in the care plan section)       OT Frequency:       Co-evaluation               AM-PAC OT 6 Clicks Daily Activity     Outcome Measure Help from another person eating meals?: None Help from another person taking care of personal grooming?: None Help from another person toileting, which includes using toliet, bedpan, or urinal?: None Help from another person bathing (including washing, rinsing, drying)?: None Help from another person to put on and taking off regular upper body clothing?: None Help from another person to put on and taking off regular lower body clothing?: None 6 Click Score: 24   End of Session Equipment Utilized During Treatment: Gait belt;Rolling walker (2 wheels);Oxygen  Nurse  Communication: Mobility status  Activity Tolerance: Patient tolerated treatment well Patient left: in chair;with call bell/phone within reach;with family/visitor present  OT Visit Diagnosis: Other abnormalities of gait and mobility (R26.89)                Time: 1430-1502 OT Time Calculation (min): 32 min Charges:  OT General Charges $OT Visit: 1 Visit OT Evaluation $OT Eval Low Complexity: 1 Low  Freddy Spadafora, OTR/L 06/12/24, 4:15 PM  Lynnsey Barbara E Aizlynn Digilio 06/12/2024, 4:09 PM

## 2024-06-12 NOTE — Progress Notes (Signed)
 Patient being discharged home.  Patient to be transported by her mother.  IV removed with the catheter intact.  Discharge instructions given to the patient who verbalized understanding. Prescriptions sent home with the patient.

## 2024-06-12 NOTE — Care Management Important Message (Signed)
 Important Message  Patient Details  Name: Abigail [name] [facility] MRN: [id number] Date of Birth: 12/21/1965   Important Message Given:  Yes - Medicare IM     [name] 06/12/2024, 1:30 PM

## 2024-06-12 NOTE — Plan of Care (Signed)
  Problem: Education: Goal: Knowledge of General Education information will improve Description: Including pain rating scale, medication(s)/side effects and non-pharmacologic comfort measures 06/12/2024 1656 by Les Delon CROME, RN Outcome: Adequate for Discharge 06/12/2024 0810 by Les Delon CROME, RN Outcome: Progressing   Problem: Health Behavior/Discharge Planning: Goal: Ability to manage health-related needs will improve Outcome: Adequate for Discharge   Problem: Clinical Measurements: Goal: Ability to maintain clinical measurements within normal limits will improve Outcome: Adequate for Discharge Goal: Will remain free from infection Outcome: Adequate for Discharge Goal: Diagnostic test results will improve Outcome: Adequate for Discharge Goal: Respiratory complications will improve Outcome: Adequate for Discharge Goal: Cardiovascular complication will be avoided Outcome: Adequate for Discharge   Problem: Activity: Goal: Risk for activity intolerance will decrease Outcome: Adequate for Discharge   Problem: Nutrition: Goal: Adequate nutrition will be maintained Outcome: Adequate for Discharge   Problem: Coping: Goal: Level of anxiety will decrease Outcome: Adequate for Discharge   Problem: Elimination: Goal: Will not experience complications related to bowel motility Outcome: Adequate for Discharge Goal: Will not experience complications related to urinary retention Outcome: Adequate for Discharge   Problem: Pain Managment: Goal: General experience of comfort will improve and/or be controlled Outcome: Adequate for Discharge   Problem: Safety: Goal: Ability to remain free from injury will improve Outcome: Adequate for Discharge   Problem: Skin Integrity: Goal: Risk for impaired skin integrity will decrease Outcome: Adequate for Discharge   Problem: Education: Goal: Ability to demonstrate management of disease process will improve Outcome:  Adequate for Discharge Goal: Ability to verbalize understanding of medication therapies will improve Outcome: Adequate for Discharge Goal: Individualized Educational Video(s) Outcome: Adequate for Discharge   Problem: Activity: Goal: Capacity to carry out activities will improve Outcome: Adequate for Discharge   Problem: Cardiac: Goal: Ability to achieve and maintain adequate cardiopulmonary perfusion will improve Outcome: Adequate for Discharge   Problem: Acute Rehab PT Goals(only PT should resolve) Goal: Pt Will Go Supine/Side To Sit Outcome: Adequate for Discharge Goal: Pt Will Transfer Bed To Chair/Chair To Bed Outcome: Adequate for Discharge Goal: Pt Will Ambulate Outcome: Adequate for Discharge Goal: Pt Will Go Up/Down Stairs Outcome: Adequate for Discharge

## 2024-06-12 NOTE — TOC Transition Note (Signed)
 Transition of Care [facility]) - Discharge Note   Patient Details  Name: Abigail [name] [facility] MRN: [id number] Date of Birth: 1965-11-02  Transition of Care [facility]) CM/SW Contact:  Victory Jackquline RAMAN, RN Phone Number: 06/12/2024, 3:57 PM   Clinical Narrative:   Pt discharging home with home health. No further concerns. RNCM Signing off. [facility] Agency notified.   Final next level of care: Home w Home Health Services Barriers to Discharge: Barriers Resolved   Patient Goals and CMS Choice            Discharge Placement                Patient to be transferred to facility by: Mom Name of family member notified: Mom Patient and family notified of of transfer: 06/12/24  Discharge Plan and Services Additional resources added to the After Visit Summary for       Post Acute Care Choice: Resumption of Svcs/PTA Provider                    HH Arranged: PT, OT [facility] Agency: [facility] Date [facility] Agency Contacted: 06/11/24   Representative spoke with at [facility] Agency: Larraine  Social Drivers of Health (SDOH) Interventions SDOH Screenings   Food Insecurity: No Food Insecurity (06/09/2024)  Housing: Low Risk  (06/09/2024)  Transportation Needs: No Transportation Needs (06/09/2024)  Utilities: Not At Risk (06/09/2024)  Depression (PHQ2-9): Medium Risk (06/09/2024)  Financial Resource Strain: Medium Risk (06/09/2024)  Physical Activity: Inactive (01/27/2024)   Received from [facility]  Social Connections: Moderately Isolated (01/27/2024)   Received from [facility]  Stress: Stress Concern Present (06/09/2024)  Tobacco Use: High Risk (06/09/2024)  Health Literacy: Adequate Health Literacy (06/09/2024)     Readmission Risk Interventions    06/11/2024   12:35 PM 04/06/2024    2:40 PM 12/31/2023    3:36 PM  Readmission Risk Prevention Plan  Transportation Screening Complete Complete   Medication Review (RN Care Manager) Complete Complete    PCP or Specialist appointment within 3-5 days of discharge Complete Complete Complete  HRI or Home Care Consult Complete Complete Complete  SW Recovery Care/Counseling Consult Complete Complete Complete  Palliative Care Screening Not Applicable Not Applicable Not Applicable  Skilled Nursing Facility Not Applicable Not Applicable Not Complete  SNF Comments   Will follow up tomorrow after PT works with her again.

## 2024-06-12 NOTE — Plan of Care (Signed)

## 2024-06-12 NOTE — Evaluation (Signed)
 Physical Therapy Evaluation Patient Details Name: Abigail [name] [facility] MRN: [id number] DOB: February 19, 1966 Today's Date: 06/12/2024  History of Present Illness  presented to ER secondary to fluid overload, SOB; admitted for management of CHF exacerbation.  Clinical Impression  Patient resting in bed upon arrival to room; alert and oriented, follows commands and agreeable to participation with session.  Eager for OOB activities and progression towards discharge as appropriate. Bilat UE/LE strength and ROM grossly symmetrical and WFL; no focal weakness appreciated.  Does endorse 10/10 pain all over, described as chronic, baseline pain; meds requested per RN.   Able to complete bed mobility with mod indep; sit/stand, basic transfers and gait (200' x3) with RW, sup/mod indep.  Demonstrates reciprocal stepping pattern with fair step height/length, fair/good cadence (10' walk time, 6-7 seconds); good walker position and control.  Minimal SOB, but with noted desat to 80-81% on RA (improves to >90% with 2-3 min of seated rest and pursed lip breathing); does improve/maintain >90% on 2L during subsequent trials.  Anticipate need for O2 with exertional activities.  RN/MD informed/aware. Would benefit from skilled PT to address above deficits and promote optimal return to PLOF.; recommend post-acute PT follow up as indicated by interdisciplinary care team.     SaO2 on room air at rest = 92% SaO2 on room air while ambulating = 80% SaO2 on 2 liters of O2 while ambulating = 91%       If plan is discharge home, recommend the following:     Can travel by private vehicle        Equipment Recommendations    Recommendations for Other Services       Functional Status Assessment Patient has had a recent decline in their functional status and demonstrates the ability to make significant improvements in function in a reasonable and predictable amount of time.     Precautions / Restrictions  Precautions Precautions: (P) Fall Recall of Precautions/Restrictions: (P) Intact Precaution/Restrictions Comments: (P) needs 02 for mobility Restrictions Weight Bearing Restrictions Per Provider Order: (P) No      Mobility  Bed Mobility Overal bed mobility: Modified Independent                  Transfers Overall transfer level: Modified independent Equipment used: Standard walker                    Ambulation/Gait Ambulation/Gait assistance: Supervision, Modified independent (Device/Increase time) Gait Distance (Feet):  (x2) Assistive device: Rolling walker (2 wheels)   Gait velocity: 10' walk time, 6 seconds     General Gait Details: reciprocal stepping pattern with fair step height/length, fair cadence; good walker position and control.  Minimal SOB, but with noted desat to 80-81% on RA (improves to >90% with 2-3 min of seated rest and pursed lip breathing)  Stairs            Wheelchair Mobility     Tilt Bed    Modified Rankin (Stroke Patients Only)       Balance Overall balance assessment: Needs assistance Sitting-balance support: No upper extremity supported, Feet supported Sitting balance-Leahy Scale: Good     Standing balance support: Bilateral upper extremity supported Standing balance-Leahy Scale: Good                               Pertinent Vitals/Pain Pain Assessment Pain Assessment: Faces Faces Pain Scale: Hurts little more Pain Location: all over Pain Descriptors /  Indicators: Aching Pain Intervention(s): Limited activity within patient's tolerance, Monitored during session, Repositioned, Patient requesting pain meds-RN notified    Home Living Family/patient expects to be discharged to:: Private residence Living Arrangements: Parent Available Help at Discharge: Family;Available 24 hours/day Type of Home: House Home Access: Ramped entrance;Stairs to enter Entrance Stairs-Rails: Right Entrance Stairs-Number  of Steps: 2 STE her mothers home, ramp if she DC to her house   Home Layout: One level Home Equipment: Agricultural consultant (2 wheels);Rollator (4 wheels);BSC/3in1;Electric scooter Additional Comments: Pt currently lives with her mother, Her godson lives at her home and will be able to physically assist her if needed.    Prior Function Prior Level of Function : Needs assist       Physical Assist : ADLs (physical);Mobility (physical) Mobility (physical): Gait ADLs (physical): Dressing;Bathing Mobility Comments: Ambalatory with RW vs 4WRW for household distances; home O2 that is used sometimes.  At least 5 falls in previous six months (due to LOB) ADLs Comments: MOD I for UB dressing, feeding, grooming, toileting; mom assists with LB dressing (cannot feel her feet), assist with IADLs as needed     Extremity/Trunk Assessment   Upper Extremity Assessment Upper Extremity Assessment: Overall WFL for tasks assessed    Lower Extremity Assessment Lower Extremity Assessment: Overall WFL for tasks assessed       Communication   Communication Communication: No apparent difficulties    Cognition Arousal: Alert Behavior During Therapy: WFL for tasks assessed/performed   PT - Cognitive impairments: No apparent impairments                         Following commands: Intact       Cueing Cueing Techniques: Verbal cues     General Comments General comments (skin integrity, edema, etc.): desat to 80% on RA with ambulation around nurses station, desat to 90% on 2L during ambulation    Exercises Other Exercises Other Exercises: 200' with RW, sup/mod indep--sats >90% on 2L.  Anticipate need for supplemental O2 upon discharge.  RN/MD informed/aware.   Assessment/Plan    PT Assessment Patient needs continued PT services  PT Problem List Decreased activity tolerance;Decreased balance;Decreased mobility;Cardiopulmonary status limiting activity       PT Treatment Interventions  DME instruction;Gait training;Stair training;Functional mobility training;Therapeutic activities;Therapeutic exercise;Balance training;Patient/family education    PT Goals (Current goals can be found in the Care Plan section)  Acute Rehab PT Goals Patient Stated Goal: to get out of now PT Goal Formulation: With patient Time For Goal Achievement: 06/26/24 Potential to Achieve Goals: Good    Frequency Min 2X/week     Co-evaluation               AM-PAC PT 6 Clicks Mobility  Outcome Measure Help needed turning from your back to your side while in a flat bed without using bedrails?: None Help needed moving from lying on your back to sitting on the side of a flat bed without using bedrails?: None Help needed moving to and from a bed to a chair (including a wheelchair)?: None Help needed standing up from a chair using your arms (e.g., wheelchair or bedside chair)?: None Help needed to walk in hospital room?: None Help needed climbing 3-5 steps with a railing? : A Little 6 Click Score: 23    End of Session Equipment Utilized During Treatment: Gait belt;Oxygen  Activity Tolerance: Patient tolerated treatment well Patient left: in chair;with call bell/phone within reach;with chair alarm set Nurse Communication:  Mobility status PT Visit Diagnosis: Difficulty in walking, not elsewhere classified (R26.2)    Time: 1435-1500 PT Time Calculation (min) (ACUTE ONLY): 25 min   Charges:   PT Evaluation $PT Eval Low Complexity: 1 Low   PT General Charges $$ ACUTE PT VISIT: 1 Visit         Renne Platts H. Delores, PT, DPT, NCS 06/12/24, 4:44 PM [phone]

## 2024-06-12 NOTE — Discharge Summary (Signed)
 Physician Discharge Summary   Patient: Abigail [name] [facility] MRN: [id number] DOB: April 13, 1966  Admit date:     06/09/2024  Discharge date: 06/12/24  Discharge Physician: Drue ONEIDA Potter   PCP: [name], Susan K, MD   Recommendations at discharge:  Follow-up with PT OT  Discharge Diagnoses:  CHF (congestive heart failure) (HCC) Fluid overload Worsening of chronic hypoalbuminemia Acute metabolic encephalopathy-improved Chronic hypotension History of seizure disorder Worsening of chronic normocytic anemia History of folate acid deficiency COPD   Hospital Course: From HPI Abigail [name] is a 58 y.o. 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   Mother at bedside gave history.  Family reported that patient has been  building up fluid for 3 to 4 weeks, mother with patient every day and found her weight  fluctuate, 3 days ago she was started on diuresis, but patient continued to have fluid buildup, especially her legs, with fluid weeping out and ulcers develop.  She also started develop exertional dyspnea.  Mother also reported that patient has had multiple loose diarrhea since last night.   ED Course: Afebrile, blood pressure 124/68 O2 saturation 99% on 4 L.  Chest x-ray showed pulmonary congestion with right lower lobe atelectasis.  Blood work showed albumin 2.4 sodium 136 potassium 4.0 BUN 9 creatinine 0.6 glucose 97, VBG 7.40/58/40.   Patient was given Lasix  x 1 in the ED.   Patient mental status returned to baseline.  Have been diuresed appropriately and currently weaned off oxygen .  Patient is therefore cleared for discharge today and will follow-up with PCP.  Consultants: None Procedures performed: None Disposition: Home Diet recommendation:  Cardiac diet DISCHARGE MEDICATION: Allergies as of  06/12/2024       Reactions   Nsaids Hives   Tapentadol Swelling, Rash, Other (See Comments)   Nucynta- Made her deathly sick   Cephalexin  Other (See Comments)   Reaction not cited   Codeine Other (See Comments)   Reaction not cited   Darvocet [propoxyphene N-acetaminophen ] Other (See Comments)   Reaction not cited   Latex Other (See Comments)   Reaction not cited   Silicone Other (See Comments)   Reaction not cited   Sulfa Antibiotics Hives   Tape Other (See Comments)   Reaction not cited   Meloxicam Rash        Medication List     TAKE these medications    acetaminophen  500 MG tablet Commonly known as: TYLENOL  Take 1,000 mg by mouth every 6 (six) hours as needed for moderate pain (pain score 4-6).   albuterol  108 (90 Base) MCG/ACT inhaler Commonly known as: VENTOLIN  HFA Inhale 2 puffs into the lungs. Take two (2) puffs by mouth every 4 to 6 hours as needed for wheezing or shortness of breath   AMBULATORY NON FORMULARY MEDICATION Knee-high, medium compression, graduated compression stockings. Apply to lower extremities. [email or web address], Zippered Compression Stockings, medium circ, long length   bisacodyl  5 MG EC tablet Generic drug: bisacodyl  Take 1 tablet (5 mg total) by mouth daily as needed for moderate constipation.   carbamazepine  100 MG 12 hr tablet Commonly known as: TEGRETOL  XR Take 1 tablet (100 mg total) by mouth 2 (two) times daily.   DULoxetine  60 MG capsule Commonly known as: Cymbalta  Take 1 capsule (60 mg total) by mouth daily.   feeding supplement Liqd Take 237 mLs by  mouth 3 (three) times daily between meals.   folic acid  1 MG tablet Commonly known as: FOLVITE  Take 1 tablet (1 mg total) by mouth daily.   gabapentin  300 MG capsule Commonly known as: NEURONTIN  Take 1 capsule (300 mg total) by mouth 4 (four) times daily.   lamoTRIgine  25 MG tablet Commonly known as: LAMICTAL  TAKE 2 TABLETS BY MOUTH 2 TIMES DAILY.   levETIRAcetam   750 MG tablet Commonly known as: KEPPRA  Take 2 tablets (1,500 mg total) by mouth 2 (two) times daily.   Melatonin 10 MG Tabs Take 10 mg by mouth at bedtime.   midodrine  5 MG tablet Commonly known as: PROAMATINE  Take 5 mg by mouth 3 (three) times daily with meals.   mirtazapine  7.5 MG tablet Commonly known as: REMERON  Take 1 tablet (7.5 mg total) by mouth at bedtime.   morphine  15 MG tablet Commonly known as: MSIR Take 1 tablet (15 mg total) by mouth every 8 (eight) hours as needed for severe pain (pain score 7-10).   NON FORMULARY Apply 1 Application topically 3 (three) times daily as needed. Rock Salt Cream (Similar to [employer])   ondansetron  4 MG tablet Commonly known as: ZOFRAN  Take 1 tablet (4 mg total) by mouth every 8 (eight) hours as needed for nausea or vomiting.   Pancrelipase  (Lip-Prot-Amyl) 24000-76000 units Cpep Take 1 capsule (24,000 Units total) by mouth 3 (three) times daily with meals.   pantoprazole  40 MG tablet Commonly known as: PROTONIX  Take 1 tablet (40 mg total) by mouth daily.   potassium chloride  SA 20 MEQ tablet Commonly known as: KLOR-CON  M TAKE 1 TABLET BY MOUTH EVERY DAY   Santyl  250 UNIT/GM ointment Generic drug: collagenase  Apply 1 Application topically daily.   torsemide  20 MG tablet Commonly known as: DEMADEX  Take 1 tablet (20 mg total) by mouth daily.   VITAMIN D -3 PO Take 1 capsule by mouth every morning.        Discharge Exam: Filed Weights   06/09/24 1038 06/11/24 0500 06/12/24 0500  Weight: 78.5 kg 74.1 kg 75.5 kg   Neck: normal, supple, no masses, no thyromegaly Respiratory: clear to auscultation bilaterally, no wheezing, accessory muscle use.  Cardiovascular: Regular rate and rhythm, no murmurs / rubs / gallops. Abdomen: no tenderness, no masses palpated. No hepatosplenomegaly. Bowel sounds positive.  Musculoskeletal: no clubbing / cyanosis. No joint deformity upper and lower extremities. Good ROM, no contractures.  Normal muscle tone.  Skin: no rashes, lesions, ulcers. No induration Neurologic: No facial droops, moving limbs Psychiatric: Lethargic, arousable, confused.  Condition at discharge: good  The results of significant diagnostics from this hospitalization (including imaging, microbiology, ancillary and laboratory) are listed below for reference.   Imaging Studies: DG Chest 1 View Result Date: 06/10/2024 EXAM: 1 VIEW XRAY OF THE CHEST 06/10/2024 06:08:00 AM COMPARISON: 06/09/2024 CLINICAL HISTORY: CHF (congestive heart failure) (HCC) [address]06. CHF. FINDINGS: LUNGS AND PLEURA: Low lung volumes. Increasing patchy opacity at right lung base. Increasing patchy opacity at left lung base. Diffuse interstitial prominence. Questionable small right pleural effusion. No pneumothorax. HEART AND MEDIASTINUM: Aortic atherosclerosis. No acute abnormality of the cardiac and mediastinal silhouettes. BONES AND SOFT TISSUES: Intact cervical spinal fixation hardware. No acute osseous abnormality. IMPRESSION: 1. Bilateral basilar opacities with atelectasis favored over infection. 2. Diffuse interstitial opacity compatible with mild or developing interstitial edema. 3. Questionable small right pleural effusion. Electronically signed by: Helayne Hurst MD 06/10/2024 06:18 AM EDT RP Workstation: [id number]   CT HEAD WO CONTRAST ( ) Result Date:  06/09/2024 EXAM: CT HEAD WITHOUT CONTRAST 06/09/2024 03:55:33 PM TECHNIQUE: CT of the head was performed without the administration of intravenous contrast. Automated exposure control, iterative reconstruction, and/or weight based adjustment of the mA/kV was utilized to reduce the radiation dose to as low as reasonably achievable. COMPARISON: 04/07/2024 CLINICAL HISTORY: Mental status change, unknown cause. congestive heart failure) (HCC). FINDINGS: BRAIN AND VENTRICLES: No acute hemorrhage. No evidence of acute infarct. No hydrocephalus. No extra-axial collection. No mass effect or midline  shift. ORBITS: No acute abnormality. SINUSES: Complete opacification of left maxillary sinus. SOFT TISSUES AND SKULL: No acute soft tissue abnormality. No skull fracture. IMPRESSION: 1. No acute intracranial abnormality. 2. Complete opacification of the left maxillary sinus. Electronically signed by: Donnice Mania MD 06/09/2024 04:03 PM EDT RP Workstation: [id number]   DG Chest 2 View Result Date: 06/09/2024 EXAM: 2 VIEW(S) XRAY OF THE CHEST 06/09/2024 11:30:27 AM COMPARISON: 05/03/2024 CLINICAL HISTORY: SOB. Fluid retention / shob FINDINGS: LUNGS AND PLEURA: Low lung volumes. Elevated right hemidiaphragm with right base atelectasis. Diffuse interstitial prominence consistent with interstitial edema. No pleural effusion. No pneumothorax. HEART AND MEDIASTINUM: Cardiomegaly. Calcified aorta. BONES AND SOFT TISSUES: Cervical fixation hardware. IMPRESSION: 1. Diffuse interstitial prominence consistent with interstitial edema. 2. Low lung volumes with elevated right hemidiaphragm and right basilar atelectasis. Electronically signed by: Rockey Kilts MD 06/09/2024 12:42 PM EDT RP Workstation: [id number]    Microbiology: Results for orders placed or performed during the hospital encounter of 06/09/24  Gastrointestinal Panel by PCR , Stool     Status: None   Collection Time: 06/10/24  1:12 PM   Specimen: Stool  Result Value Ref Range Status   Campylobacter species NOT DETECTED NOT DETECTED Final   Plesimonas shigelloides NOT DETECTED NOT DETECTED Final   Salmonella species NOT DETECTED NOT DETECTED Final   Yersinia enterocolitica NOT DETECTED NOT DETECTED Final   Vibrio species NOT DETECTED NOT DETECTED Final   Vibrio cholerae NOT DETECTED NOT DETECTED Final   Enteroaggregative E coli (EAEC) NOT DETECTED NOT DETECTED Final   Enteropathogenic E coli (EPEC) NOT DETECTED NOT DETECTED Final   Enterotoxigenic E coli (ETEC) NOT DETECTED NOT DETECTED Final   Shiga like toxin producing E coli (STEC) NOT DETECTED  NOT DETECTED Final   Shigella/Enteroinvasive E coli (EIEC) NOT DETECTED NOT DETECTED Final   Cryptosporidium NOT DETECTED NOT DETECTED Final   Cyclospora cayetanensis NOT DETECTED NOT DETECTED Final   Entamoeba histolytica NOT DETECTED NOT DETECTED Final   Giardia lamblia NOT DETECTED NOT DETECTED Final   Adenovirus F40/41 NOT DETECTED NOT DETECTED Final   Astrovirus NOT DETECTED NOT DETECTED Final   Norovirus GI/GII NOT DETECTED NOT DETECTED Final   Rotavirus A NOT DETECTED NOT DETECTED Final   Sapovirus (I, II, IV, and V) NOT DETECTED NOT DETECTED Final    Comment: Performed at [facility], [address] [place], [place] [address]    Labs: CBC: Recent Labs  Lab 06/08/24 1612 06/09/24 1448 06/11/24 0406 06/12/24 0528  WBC 10.6* 8.4 7.2 6.2  NEUTROABS 6.3  --  3.6 2.7  HGB 9.2* 8.1* 8.3* 9.4*  HCT 30.0* 27.2* 28.0* 31.1*  MCV 90.6 91.9 92.4 92.6  PLT 306 265 262 260   Basic Metabolic Panel: Recent Labs  Lab 06/09/24 1158 06/10/24 0426 06/11/24 0406 06/12/24 0528  NA 136 142 141 145  K 4.0 3.2* 3.2* 3.5  CL 96* 101 95* 101  CO2 29 33* 33* 35*  GLUCOSE 97 81 113* 107*  BUN 9 9  9 7  CREATININE 0.61 0.65 0.65 0.55  CALCIUM  8.1* 7.9* 7.6* 8.1*   Liver Function Tests: Recent Labs  Lab 06/09/24 1158  AST 25  ALT 10  ALKPHOS 146*  BILITOT 0.4  PROT 6.7  ALBUMIN 2.4*   CBG: No results for input(s): GLUCAP in the last 168 hours.  Discharge time spent:  39 minutes.  Signed: Drue ONEIDA Potter, MD [facility] 06/12/2024

## 2024-06-12 NOTE — Plan of Care (Signed)
   Problem: Education: Goal: Knowledge of General Education information will improve Description Including pain rating scale, medication(s)/side effects and non-pharmacologic comfort measures Outcome: Progressing

## 2024-06-13 LAB — FECAL FAT, QUALITATIVE
Fat Qual Neutral, Stl: NORMAL
Fat Qual Total, Stl: NORMAL

## 2024-06-15 ENCOUNTER — Other Ambulatory Visit: Payer: Self-pay | Admitting: Family Medicine

## 2024-06-15 ENCOUNTER — Telehealth: Payer: Self-pay | Admitting: *Deleted

## 2024-06-15 DIAGNOSIS — R600 Localized edema: Secondary | ICD-10-CM

## 2024-06-15 NOTE — Transitions of Care (Post Inpatient/ED Visit) (Signed)
 06/15/2024  Name: Abigail [name] [facility] MRN: [id number] DOB: 04-Jan-1966  Today's TOC FU Call Status: Today's TOC FU Call Status:: Successful TOC FU Call Completed TOC FU Call Complete Date: 06/15/24 Patient's Name and Date of Birth confirmed.  Transition Care Management Follow-up Telephone Call Date of Discharge: 06/12/24 Discharge Facility: [facility]) Type of Discharge: Inpatient Admission Primary Inpatient Discharge Diagnosis:: Acute on chronic CHF How have you been since you were released from the hospital?: Better (per mother: I told you I thought she was going to end up back in the hospital, like she always does.  She is much better now- they pulled off over 30 lbs of fluid while she was there.  Her legs finally look much better) Any questions or concerns?: No  Items Reviewed: Did you receive and understand the discharge instructions provided?: Yes (reviewed with patient's mother- caregiver who verbalizes good understanding of same) Medications obtained,verified, and reconciled?: Partial Review Completed (Confirmed per patient's caregiver obtained/ is taking all newly Rx'd medications as instructed; caregiver-manages medications and denies questions/ concerns around medications today- declines full review) Reason for Partial Mediation Review: Caregiver declined full medication review Any new allergies since your discharge?: No Dietary orders reviewed?: No (per caregiver/ mother: She is still pretty much eating whatever she wants to eat, I can't tell her what she can and can't eat; the home health nurse has tried to tell her- but she doesn't really listen and follow the advice) Do you have support at home?: Yes People in Home [RPT]: parent(s) Name of Support/Comfort Primary Source: Reports independent in some self-care activities; mother- caregiver assists as indicated, as much she is able; resides with supportive mother- drives patient to  appointments and assists with self-care activities as needed- mother is elderly and limitied in her ability to assist patient  Medications Reviewed Today: Medications Reviewed Today     Reviewed by Cephas Revard M, RN (Registered Nurse) on 06/15/24 at 1054  Med List Status: <None>   Medication Order Taking? Sig Documenting Provider Last Dose Status Informant  acetaminophen  (TYLENOL ) 500 MG tablet [id number]  Take 1,000 mg by mouth every 6 (six) hours as needed for moderate pain (pain score 4-6). [provider]  Active Mother  albuterol  (VENTOLIN  HFA) 108 [id number] Base) MCG/ACT inhaler [id number]  Inhale 2 puffs into the lungs. Take two (2) puffs by mouth every 4 to 6 hours as needed for wheezing or shortness of breath [provider]  Active Mother           Med Note GAYLENE DARVIN JINNY Austin May 03, 2024 10:24 PM)    SONJIA JESSE SCHLOSSMAN MEDICATION [id number]  Knee-high, medium compression, graduated compression stockings. Apply to lower extremities. [email or web address], Zippered Compression Stockings, medium circ, long length [name], Susan K, MD  Active Mother  bisacodyl  (DULCOLAX) 5 MG EC tablet [id number]  Take 1 tablet (5 mg total) by mouth daily as needed for moderate constipation. Maree Hue, MD  Active Mother           Med Note GAYLENE, DARVIN JINNY Austin May 03, 2024 10:26 PM)    carbamazepine  (TEGRETOL  XR) 100 MG 12 hr tablet [id number]  Take 1 tablet (100 mg total) by mouth 2 (two) times daily. Wouk, Devaughn Sayres, MD  Active Mother  Cholecalciferol  (VITAMIN D -3 PO) [id number]  Take 1 capsule by mouth every morning. [provider]  Active Mother  collagenase  (SANTYL ) 250 UNIT/GM ointment [id number]  Apply 1 Application topically  daily.  Patient not taking: Reported on 06/10/2024   [name], Susan K, MD  Active Mother  DULoxetine  (CYMBALTA ) 60 MG capsule [id number]  Take 1 capsule (60 mg total) by mouth daily. [name], Susan K, MD  Active Mother  feeding supplement (ENSURE  PLUS HIGH PROTEIN) LIQD [id number]  Take 237 mLs by mouth 3 (three) times daily between meals. Von Bellis, MD  Active Mother  folic acid  (FOLVITE ) 1 MG tablet [id number]  Take 1 tablet (1 mg total) by mouth daily. Von Bellis, MD  Active Mother  gabapentin  (NEURONTIN ) 300 MG capsule [id number]  Take 1 capsule (300 mg total) by mouth 4 (four) times daily. Maree Hue, MD  Active Mother           Med Note ZENA NATHANAEL CROME   Wed Jun 10, 2024 10:40 AM) Taking 2 in the morning and 2 at night  lamoTRIgine  (LAMICTAL ) 25 MG tablet [id number]  TAKE 2 TABLETS BY MOUTH 2 TIMES DAILY. [name], Susan K, MD  Active Mother  levETIRAcetam  (KEPPRA ) 750 MG tablet [id number]  Take 2 tablets (1,500 mg total) by mouth 2 (two) times daily. Kandis Devaughn Sayres, MD  Active Mother  Melatonin 10 MG TABS [id number]  Take 10 mg by mouth at bedtime. [provider]  Active Mother  midodrine  (PROAMATINE ) 5 MG tablet [id number]  Take 5 mg by mouth 3 (three) times daily with meals. [provider]  Active Mother  mirtazapine  (REMERON ) 7.5 MG tablet [id number]  Take 1 tablet (7.5 mg total) by mouth at bedtime. [name], Susan K, MD  Active Mother  morphine  (MSIR) 15 MG tablet [id number]  Take 1 tablet (15 mg total) by mouth every 8 (eight) hours as needed for severe pain (pain score 7-10). Jens Durand, MD  Active Mother           Med Note ZENA NATHANAEL CROME   Wed Jun 10, 2024 10:42 AM) [place] pain magmt dr said she could have up to 5 tabs a day  NON FORMULARY [id number]  Apply 1 Application topically 3 (three) times daily as needed. Rock Salt Cream (Similar to [employer]-mogul) [provider]  Active Mother  ondansetron  (ZOFRAN ) 4 MG tablet [id number]  Take 1 tablet (4 mg total) by mouth every 8 (eight) hours as needed for nausea or vomiting. [name], Susan K, MD  Active Mother  Pancrelipase , Lip-Prot-Amyl, 24000-76000 units CPEP [id number] Yes Take 1 capsule (24,000 Units total) by mouth 3 (three) times daily with meals.  Dorinda Drue DASEN, MD  Active   pantoprazole  (PROTONIX ) 40 MG tablet [id number]  Take 1 tablet (40 mg total) by mouth daily. [name], Susan K, MD  Active Mother  potassium chloride  SA (KLOR-CON  M) 20 MEQ tablet [id number] Yes TAKE 1 TABLET BY MOUTH EVERY DAY [name], Susan K, MD  Active Mother  torsemide  (DEMADEX ) 20 MG tablet [id number] Yes Take 1 tablet (20 mg total) by mouth daily. [name], Susan K, MD  Active Mother           Home Care and Equipment/Supplies: Were Home Health Services Ordered?: Yes Name of Home Health Agency:: [facility]: PT/ OT/ SN: per inpatient TOC note: resumption of services authorized Has Agency set up a time to come to your home?: No (mother- caregiver reports she has direct phone number for previously established home Health RN Lorenza; states plans to call after [facility] call- denies need for assistance: I call her all the time if I need something) EMR reviewed for Home Health Orders: Orders present/patient has  not received call (refer to CM for follow-up) (verified established with [facility] longitudinal LCSW and RN CM with scheduled outreaches 06/17/24 and 06/19/24) Any new equipment or medical supplies ordered?: No  Functional Questionnaire: Do you need assistance with bathing/showering or dressing?: Yes (mother- caregiver assists as able/ indicated) Do you need assistance with meal preparation?: Yes (mother- caregiver manages meal preparation) Do you need assistance with eating?: No Do you have difficulty maintaining continence: Yes (mother- caregiver assists as able/ indicated) Do you need assistance with getting out of bed/getting out of a chair/moving?: No Do you have difficulty managing or taking your medications?: Yes (mother- caregiver manages medications)  Follow up appointments reviewed: PCP Follow-up appointment confirmed?: Yes (care coordination outreach in real-time with scheduling care guide to successfully modify routine appointment to hospital follow up PCP  appointment 06/26/24) Date of PCP follow-up appointment?: 06/26/24 Follow-up Provider: PCP- Dr. Onita Specialist [facility] Follow-up appointment confirmed?: Yes Date of Specialist follow-up appointment?: 06/17/24 Follow-Up Specialty Provider:: Vascular provider 06/17/24; 06/18/24- pain management clinic Do you need transportation to your follow-up appointment?: No Do you understand care options if your condition(s) worsen?: Yes-patient verbalized understanding  SDOH Interventions Today    Flowsheet Row Most Recent Value  SDOH Interventions   Food Insecurity Interventions Intervention Not Indicated  [per mother- caregiver]  Housing Interventions Intervention Not Indicated  [per mother- caregiver]  Transportation Interventions Intervention Not Indicated  [per mother- caregiver: she provides transportation for patient]  Utilities Interventions Intervention Not Indicated  [per mother- caregiver]   See TOC assessment tabs for additional assessment/ TOC intervention information   Mother- caregiver recently declined ongoing participation in [facility] 30-day program stating being overwhelmed with phone calls/ home health servbices/ and multiple provider appointments for both patient and for herself; reports she is expecting call from [facility] longitudinal RN CM on Friday: again reports today weekly calls is too much and patient is well-established with home health nurse/ team; confirmed upcoming scheduled [facility] Care management outreaches by LCSW (06/17/24) and RN CM (06/19/24): encouraged mother to accept calls and to engage with [facility] longitudinal team, and she verbalizes agreement of same   Pls call/ message for questions,  Beatris Blinda Lawrence, RN, BSN, Media Planner  Transitions of Care  [facility] - [facility] [phone]: direct office

## 2024-06-16 ENCOUNTER — Telehealth: Payer: Self-pay

## 2024-06-16 DIAGNOSIS — L03119 Cellulitis of unspecified part of limb: Secondary | ICD-10-CM | POA: Diagnosis not present

## 2024-06-16 DIAGNOSIS — F419 Anxiety disorder, unspecified: Secondary | ICD-10-CM | POA: Diagnosis not present

## 2024-06-16 DIAGNOSIS — I5030 Unspecified diastolic (congestive) heart failure: Secondary | ICD-10-CM | POA: Diagnosis not present

## 2024-06-16 DIAGNOSIS — E876 Hypokalemia: Secondary | ICD-10-CM | POA: Diagnosis not present

## 2024-06-16 DIAGNOSIS — J449 Chronic obstructive pulmonary disease, unspecified: Secondary | ICD-10-CM | POA: Diagnosis not present

## 2024-06-16 DIAGNOSIS — F32A Depression, unspecified: Secondary | ICD-10-CM | POA: Diagnosis not present

## 2024-06-16 DIAGNOSIS — I872 Venous insufficiency (chronic) (peripheral): Secondary | ICD-10-CM | POA: Diagnosis not present

## 2024-06-16 DIAGNOSIS — G43909 Migraine, unspecified, not intractable, without status migrainosus: Secondary | ICD-10-CM | POA: Diagnosis not present

## 2024-06-16 DIAGNOSIS — E538 Deficiency of other specified B group vitamins: Secondary | ICD-10-CM | POA: Diagnosis not present

## 2024-06-16 LAB — MULTIPLE MYELOMA PANEL, SERUM
Albumin SerPl Elph-Mcnc: 2.6 g/dL — ABNORMAL LOW (ref 2.9–4.4)
Albumin/Glob SerPl: 0.7 (ref 0.7–1.7)
Alpha 1: 0.4 g/dL (ref 0.0–0.4)
Alpha2 Glob SerPl Elph-Mcnc: 0.8 g/dL (ref 0.4–1.0)
B-Globulin SerPl Elph-Mcnc: 1.1 g/dL (ref 0.7–1.3)
Gamma Glob SerPl Elph-Mcnc: 1.4 g/dL (ref 0.4–1.8)
Globulin, Total: 3.8 g/dL (ref 2.2–3.9)
IgA: 439 mg/dL — ABNORMAL HIGH (ref 87–352)
IgG (Immunoglobin G), Serum: 1330 mg/dL (ref 586–1602)
IgM (Immunoglobulin M), Srm: 283 mg/dL — ABNORMAL HIGH (ref 26–217)
Total Protein ELP: 6.4 g/dL (ref 6.0–8.5)

## 2024-06-16 NOTE — Telephone Encounter (Signed)
**Note De-identified  [place] Obfuscation** Please advise 

## 2024-06-16 NOTE — Telephone Encounter (Signed)
 Copied from CRM [id number]. Topic: Clinical - Medical Advice >> Jun 16, 2024  2:38 PM [name]: Reason for CRM: Nest[facility] with [facility] [phone] called about discharged on Thursday 06/11/24- need skilled nursing to continue for two a week

## 2024-06-17 ENCOUNTER — Telehealth: Payer: Self-pay | Admitting: *Deleted

## 2024-06-17 ENCOUNTER — Ambulatory Visit (INDEPENDENT_AMBULATORY_CARE_PROVIDER_SITE_OTHER): Admitting: Vascular Surgery

## 2024-06-17 ENCOUNTER — Encounter: Payer: Self-pay | Admitting: *Deleted

## 2024-06-17 NOTE — Patient Instructions (Signed)
 [facility] - I am sorry I was unable to reach you today.  I work with [name], Susan K, MD and am calling to support your healthcare needs. Please contact me at [phone] at your earliest convenience. I look forward to speaking with you soon.   Thank you,    Vearl Aitken, LCSW [place]  [facility], [facility] Licensed Clinical Social Worker  Direct Dial: [phone]

## 2024-06-18 ENCOUNTER — Inpatient Hospital Stay

## 2024-06-18 DIAGNOSIS — G894 Chronic pain syndrome: Secondary | ICD-10-CM | POA: Diagnosis not present

## 2024-06-18 DIAGNOSIS — Z79891 Long term (current) use of opiate analgesic: Secondary | ICD-10-CM | POA: Diagnosis not present

## 2024-06-18 DIAGNOSIS — R4589 Other symptoms and signs involving emotional state: Secondary | ICD-10-CM | POA: Diagnosis not present

## 2024-06-18 DIAGNOSIS — M064 Inflammatory polyarthropathy: Secondary | ICD-10-CM | POA: Diagnosis not present

## 2024-06-18 DIAGNOSIS — M48062 Spinal stenosis, lumbar region with neurogenic claudication: Secondary | ICD-10-CM | POA: Diagnosis not present

## 2024-06-18 DIAGNOSIS — M961 Postlaminectomy syndrome, not elsewhere classified: Secondary | ICD-10-CM | POA: Diagnosis not present

## 2024-06-18 DIAGNOSIS — M47816 Spondylosis without myelopathy or radiculopathy, lumbar region: Secondary | ICD-10-CM | POA: Diagnosis not present

## 2024-06-18 DIAGNOSIS — M797 Fibromyalgia: Secondary | ICD-10-CM | POA: Diagnosis not present

## 2024-06-18 NOTE — Progress Notes (Signed)
 [facility] Clinical Social Work  Clinical Social Work was referred by medical provider for need for community resources.  Clinical Social Worker contacted patient by phone to offer support and assess for needs.     Interventions: Patient requested assistance with obtaining Boost Breeze.  Patient is not eligible for the [facility] because she is not receiving cancer treatment.  CSW consulted Diego Dawn, RD.  Will provide patient with coupons.      Follow Up Plan:  CSW will follow-up with patient by phone     Macario CHRISTELLA Au, LCSW  Clinical Social Worker [facility]

## 2024-06-19 ENCOUNTER — Telehealth: Payer: Self-pay

## 2024-06-19 ENCOUNTER — Other Ambulatory Visit: Payer: Self-pay | Admitting: Family Medicine

## 2024-06-19 ENCOUNTER — Other Ambulatory Visit: Payer: Self-pay

## 2024-06-19 DIAGNOSIS — G40909 Epilepsy, unspecified, not intractable, without status epilepticus: Secondary | ICD-10-CM

## 2024-06-19 NOTE — Telephone Encounter (Signed)
 Clinical Social Work attempted to call patient to give her an update on her request for [employer].  Her mailbox was full and CSW could not leave her a message.

## 2024-06-20 DIAGNOSIS — F32A Depression, unspecified: Secondary | ICD-10-CM | POA: Diagnosis not present

## 2024-06-20 DIAGNOSIS — I872 Venous insufficiency (chronic) (peripheral): Secondary | ICD-10-CM | POA: Diagnosis not present

## 2024-06-20 DIAGNOSIS — F419 Anxiety disorder, unspecified: Secondary | ICD-10-CM | POA: Diagnosis not present

## 2024-06-20 DIAGNOSIS — G43909 Migraine, unspecified, not intractable, without status migrainosus: Secondary | ICD-10-CM | POA: Diagnosis not present

## 2024-06-20 DIAGNOSIS — I5030 Unspecified diastolic (congestive) heart failure: Secondary | ICD-10-CM | POA: Diagnosis not present

## 2024-06-20 DIAGNOSIS — E538 Deficiency of other specified B group vitamins: Secondary | ICD-10-CM | POA: Diagnosis not present

## 2024-06-20 DIAGNOSIS — L03119 Cellulitis of unspecified part of limb: Secondary | ICD-10-CM | POA: Diagnosis not present

## 2024-06-20 DIAGNOSIS — E876 Hypokalemia: Secondary | ICD-10-CM | POA: Diagnosis not present

## 2024-06-20 DIAGNOSIS — J449 Chronic obstructive pulmonary disease, unspecified: Secondary | ICD-10-CM | POA: Diagnosis not present

## 2024-06-22 ENCOUNTER — Other Ambulatory Visit: Payer: Self-pay | Admitting: Oncology

## 2024-06-22 ENCOUNTER — Ambulatory Visit: Payer: Self-pay | Admitting: Oncology

## 2024-06-22 ENCOUNTER — Telehealth: Payer: Self-pay | Admitting: Family Medicine

## 2024-06-22 ENCOUNTER — Telehealth: Payer: Self-pay

## 2024-06-22 DIAGNOSIS — D509 Iron deficiency anemia, unspecified: Secondary | ICD-10-CM | POA: Insufficient documentation

## 2024-06-22 DIAGNOSIS — G43909 Migraine, unspecified, not intractable, without status migrainosus: Secondary | ICD-10-CM | POA: Diagnosis not present

## 2024-06-22 DIAGNOSIS — F32A Depression, unspecified: Secondary | ICD-10-CM | POA: Diagnosis not present

## 2024-06-22 DIAGNOSIS — E876 Hypokalemia: Secondary | ICD-10-CM | POA: Diagnosis not present

## 2024-06-22 DIAGNOSIS — D649 Anemia, unspecified: Secondary | ICD-10-CM

## 2024-06-22 DIAGNOSIS — J449 Chronic obstructive pulmonary disease, unspecified: Secondary | ICD-10-CM | POA: Diagnosis not present

## 2024-06-22 DIAGNOSIS — I872 Venous insufficiency (chronic) (peripheral): Secondary | ICD-10-CM | POA: Diagnosis not present

## 2024-06-22 DIAGNOSIS — I5030 Unspecified diastolic (congestive) heart failure: Secondary | ICD-10-CM | POA: Diagnosis not present

## 2024-06-22 DIAGNOSIS — L03119 Cellulitis of unspecified part of limb: Secondary | ICD-10-CM | POA: Diagnosis not present

## 2024-06-22 DIAGNOSIS — F419 Anxiety disorder, unspecified: Secondary | ICD-10-CM | POA: Diagnosis not present

## 2024-06-22 DIAGNOSIS — E538 Deficiency of other specified B group vitamins: Secondary | ICD-10-CM | POA: Diagnosis not present

## 2024-06-22 NOTE — Patient Instructions (Signed)
 Visit Information  Thank you for taking time to visit with me today. Please don't hesitate to contact me if I can be of assistance to you before our next scheduled appointment.  Your next care management appointment is by telephone on Friday, November 14th at 2:00pm.   Please call the care guide team at [phone] if you need to cancel, schedule, or reschedule an appointment.   A reminder to ALL patients/family/friends, please call the [place]  National Suicide Prevention Lifeline: (787) 050-1794 or TTY: 445-289-3228 TTY 802 668 8409) to talk to a trained counselor if you are experiencing a Mental Health or Behavioral Health Crisis or need someone to talk to.  Santana Stamp BSN, CCM [place]  [facility] Population Health RN Care Manager Direct Dial: [phone]  Fax: [phone]

## 2024-06-22 NOTE — Patient Outreach (Addendum)
 Complex Care Management   Visit Note  06/22/2024  Name:  Abigail [name] [facility] MRN: [id number] DOB: 1965/09/26  Situation: Referral received for Complex Care Management related to Heart Failure I obtained verbal consent from Patient.  Visit completed with mother, [name], with patient on speaker  on the phone. Patient was hospitalized 10/21-10/24/25 for acute/chronic CHF, discharge weight was 166lbs, she is being seen by [facility] nurse to change UNNA boots 1-2 times a week, boots were intact on this contact.  Mother is asking for resources for [facility], will ask care guide to send out coupons.   Background:   Past Medical History:  Diagnosis Date   Anxiety    Arthritis    Back pain    Basal cell carcinoma 10/04/2021   R axilla - needs excised 11/28/21   Basal cell carcinoma 10/04/2021   L antecubital excised 11/14/21   Diarrhea 11/12/2016   Fibromyalgia    Generalized abdominal pain 11/12/2016   H. pylori infection    Hyperlipidemia    IBS (irritable bowel syndrome)    Infectious colitis 04/29/2016   Migraines    Moderate dehydration 04/29/2016   Muscle pain    Opioid overdose (HCC) 12/07/2022   Reflux    Seizures (HCC)    Unexplained weight loss 11/12/2016    Assessment: Patient Reported Symptoms:  Cognitive Cognitive Status: Requires Assistance Decision Making, Difficulties with attention and concentration, Normal speech and language skills, Alert and oriented to person, place, and time Other: History of seizure disorder   Health Maintenance Behaviors: Annual physical exam Health Facilitated by: Healthy diet  Neurological Neurological Review of Symptoms: No symptoms reported [place]her Neurological Symptoms/Conditions [RPT]: History of seizure disorder Neurological Management Strategies: Medication therapy, Routine screening Neurological Comment: Dr. Maree,  HEENT HEENT Symptoms Reported: No symptoms reported      Cardiovascular Cardiovascular Symptoms Reported:  Other: Other Cardiovascular Symptoms: Wearing UNNA Boots, HHRN visits to change 1-2 times a week.  She is currently wearing them today, wraps are intact. Cardiovascular Management Strategies: Routine screening Cardiovascular Self-Management Outcome: 3 (uncertain) Cardiovascular Comment: Reports she is weighing daily, reports weights to [facility].  Respiratory Respiratory Symptoms Reported: No symptoms reported    Endocrine Endocrine Symptoms Reported: No symptoms reported Is patient diabetic?: No    Gastrointestinal Additional Gastrointestinal Details: Bowel Plan: if no BM in 3-5 days, uses Miralax  or a Frappe.      Genitourinary Genitourinary Symptoms Reported: Urgency, Incontinence Genitourinary Management Strategies: Incontinence garment/pad  Integumentary Integumentary Symptoms Reported: No symptoms reported Additional Integumentary Details: Not able to assess skin under NONIE Dials, mother states nurse says her legs are looking better. Skin Self-Management Outcome: 3 (uncertain)  Musculoskeletal Musculoskelatal Symptoms Reviewed: Back pain, Limited mobility Musculoskeletal Management Strategies: Medical device, Routine screening      Psychosocial Psychosocial Symptoms Reported: Not assessed     Quality of Family Relationships: helpful, involved, supportive    06/22/2024    PHQ2-9 Depression Screening   Little interest or pleasure in doing things    Feeling down, depressed, or hopeless    PHQ-2 - Total Score    Trouble falling or staying asleep, or sleeping too much    Feeling tired or having little energy    Poor appetite or overeating     Feeling bad about yourself - or that you are a failure or have let yourself or your family down    Trouble concentrating on things, such as reading the newspaper or watching television    Moving or speaking  so slowly that other people could have noticed.  Or the opposite - being so fidgety or restless that you have been moving around a lot  more than usual    Thoughts that you would be better off dead, or hurting yourself in some way    PHQ2-9 Total Score    If you checked off any problems, how difficult have these problems made it for you to do your work, take care of things at home, or get along with other people    Depression Interventions/Treatment      There were no vitals filed for this visit.  Medications Reviewed Today     Reviewed by Lucian Santana LABOR, RN (Registered Nurse) on 06/22/24 at [phone]  Med List Status: <None>   Medication Order Taking? Sig Documenting Provider Last Dose Status Informant  acetaminophen  (TYLENOL ) 500 MG tablet [id number] Yes Take 1,000 mg by mouth every 6 (six) hours as needed for moderate pain (pain score 4-6). [provider]  Active Mother  albuterol  (VENTOLIN  HFA) 108 (90 Base) MCG/ACT inhaler [id number] Yes Inhale 2 puffs into the lungs. Take two (2) puffs by mouth every 4 to 6 hours as needed for wheezing or shortness of breath [provider]  Active Mother           Med Note GAYLENE DARVIN JINNY [place] May 03, 2024 10:24 PM)    SONJIA JESSE SCHLOSSMAN MEDICATION [id number]  Knee-high, medium compression, graduated compression stockings. Apply to lower extremities. [email or web address], Zippered Compression Stockings, medium circ, long length [name], Susan K, MD  Active Mother  bisacodyl  (DULCOLAX) 5 MG EC tablet [id number] Yes Take 1 tablet (5 mg total) by mouth daily as needed for moderate constipation. Maree Hue, MD  Active Mother           Med Note GAYLENE, DARVIN JINNY Austin May 03, 2024 10:26 PM)    carbamazepine  (TEGRETOL  XR) 100 MG 12 hr tablet [id number] Yes Take 1 tablet (100 mg total) by mouth 2 (two) times daily. Wouk, Devaughn Sayres, MD  Active Mother  Cholecalciferol  (VITAMIN D -3 PO) [id number] Yes Take 1 capsule by mouth every morning. [provider]  Active Mother  collagenase  (SANTYL ) 250 UNIT/GM ointment [id number]  Apply 1 Application topically daily.  Patient  not taking: Reported on 06/22/2024   [name], Susan K, MD  Active Mother  DULoxetine  (CYMBALTA ) 60 MG capsule [id number] Yes Take 1 capsule (60 mg total) by mouth daily. [name], Susan K, MD  Active Mother  feeding supplement (ENSURE PLUS HIGH PROTEIN) LIQD [id number] Yes Take 237 mLs by mouth 3 (three) times daily between meals. Von Bellis, MD  Active Mother  folic acid  (FOLVITE ) 1 MG tablet [id number] Yes Take 1 tablet (1 mg total) by mouth daily. Von Bellis, MD  Active Mother  gabapentin  (NEURONTIN ) 300 MG capsule [id number] Yes Take 1 capsule (300 mg total) by mouth 4 (four) times daily. Maree Hue, MD  Active Mother           Med Note ZENA NATHANAEL CROME   Wed Jun 10, 2024 10:40 AM) Taking 2 in the morning and 2 at night  lamoTRIgine  (LAMICTAL ) 25 MG tablet [id number] Yes TAKE 2 TABLETS BY MOUTH 2 TIMES DAILY. [name], Susan K, MD  Active Mother  levETIRAcetam  (KEPPRA ) 750 MG tablet [id number] Yes Take 2 tablets (1,500 mg total) by mouth 2 (two) times daily. Kandis Devaughn Sayres, MD  Active Mother  Melatonin 10 MG TABS [id number]  Take 10 mg by  mouth at bedtime. [provider]  Active Mother  midodrine  (PROAMATINE ) 5 MG tablet [id number] Yes Take 5 mg by mouth 3 (three) times daily with meals. [provider]  Active Mother  mirtazapine  (REMERON ) 7.5 MG tablet [id number] Yes Take 1 tablet (7.5 mg total) by mouth at bedtime. [name], Susan K, MD  Active Mother  morphine  (MSIR) 15 MG tablet [id number] Yes Take 1 tablet (15 mg total) by mouth every 8 (eight) hours as needed for severe pain (pain score 7-10). Jens Durand, MD  Active Mother           Med Note ZENA NATHANAEL CROME   Wed Jun 10, 2024 10:42 AM) [place] pain magmt dr said she could have up to 5 tabs a day  NON FORMULARY [id number]  Apply 1 Application topically 3 (three) times daily as needed. Rock Salt Cream (Similar to [employer]) [provider]  Active Mother  ondansetron  (ZOFRAN ) 4 MG tablet [id number] Yes Take 1 tablet (4  mg total) by mouth every 8 (eight) hours as needed for nausea or vomiting. [name], Susan K, MD  Active Mother  Pancrelipase , Lip-Prot-Amyl, 24000-76000 units CPEP [id number] Yes Take 1 capsule (24,000 Units total) by mouth 3 (three) times daily with meals. Dorinda Drue DASEN, MD  Active   pantoprazole  (PROTONIX ) 40 MG tablet [id number] Yes Take 1 tablet (40 mg total) by mouth daily. [name], Susan K, MD  Active Mother  potassium chloride  SA (KLOR-CON  M) 20 MEQ tablet [id number] Yes TAKE 1 TABLET BY MOUTH EVERY DAY [name], Susan K, MD  Active Mother  torsemide  (DEMADEX ) 20 MG tablet [id number] Yes Take 1 tablet (20 mg total) by mouth daily. [name], Susan K, MD  Active Mother            Recommendation:   Discussed upcoming appts: PCP 06/26/24 Specialty provider follow-up [facility] LCSW 06/26/24; Vascular 07/23/24 Continue daily weights and report weight gains 2-3 lbs in one day or 5lbs in one week to PCP.  Sent email to [facility] for Boost Breeze coupons to be sent to mother.   Follow Up Plan:   Telephone follow-up two weeks  Santana Stamp BSN, CCM [place]  [facility] Population Health RN Care Manager Direct Dial: [phone]  Fax: [phone]

## 2024-06-22 NOTE — Telephone Encounter (Signed)
 Called patient's home health nurse, Lorenza at [phone], and advised her that patient may continue taking diuretic BID until she sees Dr. [name] on Friday 06/26/2024. She will call and advise patient to continue.   Evalene Arts, FNP-C [facility] at [facility]  [address], [place], [place] [address] [phone]

## 2024-06-22 NOTE — Telephone Encounter (Signed)
 I called pt and she stated that [name] with [facility] was still with her completing a visit... Per nurse and pt, reports pt has gained 15 pounds x 1 week.... Spoke to On call Dr On Saturday and was advised to increase diuretic to BID and has done so Sat and Sun. Pt has appt with Dr [name] 06/26/24 and they wated to know if the order for increased diuretic to BID should be extended until appt... If so, [name] with [facility] will need a VO.... Please advise

## 2024-06-22 NOTE — Patient Outreach (Signed)
 Complex Care Management   Visit Note  06/22/2024  Name:  Abigail [name] [facility] MRN: [id number] DOB: 09/02/1965  Situation: Referral received for Complex Care Management related to Heart Failure I obtained verbal consent from Patient.  Visit completed with mother, [name] with patient on speaker  on the phone.  Patient was hospitalized 10/21-10/24/25 for acute/chronic CHF, discharge weight was 166lbs, she is being seen by [facility] nurse to change UNNA boots 1-2 times a week, boots were intact on this contact.  Mother is asking for resources for [facility], will ask care guide to send out coupons.   Background:   Past Medical History:  Diagnosis Date   Anxiety    Arthritis    Back pain    Basal cell carcinoma 10/04/2021   R axilla - needs excised 11/28/21   Basal cell carcinoma 10/04/2021   L antecubital excised 11/14/21   Diarrhea 11/12/2016   Fibromyalgia    Generalized abdominal pain 11/12/2016   H. pylori infection    Hyperlipidemia    IBS (irritable bowel syndrome)    Infectious colitis 04/29/2016   Migraines    Moderate dehydration 04/29/2016   Muscle pain    Opioid overdose (HCC) 12/07/2022   Reflux    Seizures (HCC)    Unexplained weight loss 11/12/2016    Assessment: Patient Reported Symptoms:  Cognitive Cognitive Status: Requires Assistance Decision Making, Difficulties with attention and concentration, Normal speech and language skills, Alert and oriented to person, place, and time Other: History of seizure disorder   Health Maintenance Behaviors: Annual physical exam Health Facilitated by: Healthy diet  Neurological Neurological Review of Symptoms: No symptoms reported Oher Neurological Symptoms/Conditions [RPT]: History of seizure disorder Neurological Management Strategies: Medication therapy, Routine screening Neurological Comment: Dr. Maree,  HEENT HEENT Symptoms Reported: No symptoms reported      Cardiovascular Cardiovascular Symptoms Reported:  Other: Other Cardiovascular Symptoms: Wearing UNNA Boots, HHRN visits to change 1-2 times a week.  She is currently wearing them today, wraps are intact. Cardiovascular Self-Management Outcome: 3 (uncertain) Cardiovascular Comment: Reports she is weighing daily  Respiratory Respiratory Symptoms Reported: No symptoms reported    Endocrine Endocrine Symptoms Reported: No symptoms reported Is patient diabetic?: No    Gastrointestinal Additional Gastrointestinal Details: Bowel Plan: if no BM in 3-5 days, uses Miralax  or a Frappe.      Genitourinary Genitourinary Symptoms Reported: Urgency, Incontinence Genitourinary Management Strategies: Incontinence garment/pad  Integumentary Integumentary Symptoms Reported: No symptoms reported    Musculoskeletal Musculoskelatal Symptoms Reviewed: Back pain, Limited mobility Musculoskeletal Management Strategies: Medical device, Routine screening      Psychosocial Psychosocial Symptoms Reported: Not assessed     Quality of Family Relationships: helpful, involved, supportive    06/22/2024    PHQ2-9 Depression Screening   Little interest or pleasure in doing things    Feeling down, depressed, or hopeless    PHQ-2 - Total Score    Trouble falling or staying asleep, or sleeping too much    Feeling tired or having little energy    Poor appetite or overeating     Feeling bad about yourself - or that you are a failure or have let yourself or your family down    Trouble concentrating on things, such as reading the newspaper or watching television    Moving or speaking so slowly that other people could have noticed.  Or the opposite - being so fidgety or restless that you have been moving around a lot more than usual  Thoughts that you would be better off dead, or hurting yourself in some way    PHQ2-9 Total Score    If you checked off any problems, how difficult have these problems made it for you to do your work, take care of things at home, or get  along with other people    Depression Interventions/Treatment      There were no vitals filed for this visit.  Medications Reviewed Today   Medications were not reviewed in this encounter     Recommendation:   Discussed upcoming appts:  PCP 06/26/24 Specialty provider appts: Chrystal Land [facility] LCSW 06/26/24; Vascular 07/23/24 Continue daily weights and report weight gains 2-3 lbs in one day or 5lbs in one week to PCP.  Sent email to [facility] for [employer] coupons to be sent to mother.   Follow Up Plan:   Telephone follow-up two weeks  Santana Stamp BSN, CCM [place]  [facility] RN Care Manager Direct Dial: [phone]  Fax: [phone]

## 2024-06-23 ENCOUNTER — Other Ambulatory Visit: Payer: Self-pay

## 2024-06-23 NOTE — Progress Notes (Signed)
 Spoke to to patient and informed her of MD recommendation to take oral iron and B12 supplement. Pt has been scheduled for 3 month follow-up and appt details given.

## 2024-06-23 NOTE — Progress Notes (Signed)
 Error

## 2024-06-24 ENCOUNTER — Telehealth: Payer: Self-pay | Admitting: Family Medicine

## 2024-06-24 ENCOUNTER — Other Ambulatory Visit

## 2024-06-24 NOTE — Telephone Encounter (Signed)
 Copied from CRM [id number]. Topic: General - Other >> Jun 22, 2024  2:45 PM Lonell PEDLAR wrote: Reason for CRM: Lorenza, home health rep called regarding, she gained 15 pounds over the course of 4 days, she has not lost any of it, even with increased dosage of  potassium chloride  SA (KLOR-CON  M) 20 MEQ tablet torsemide  (DEMADEX ) 20 MG tablet  Patient is experiencing increased urination. Less edema in legs C/b: [phone]

## 2024-06-26 ENCOUNTER — Encounter: Payer: Self-pay | Admitting: Family Medicine

## 2024-06-26 ENCOUNTER — Ambulatory Visit (INDEPENDENT_AMBULATORY_CARE_PROVIDER_SITE_OTHER): Admitting: Family Medicine

## 2024-06-26 ENCOUNTER — Other Ambulatory Visit: Payer: Self-pay | Admitting: *Deleted

## 2024-06-26 VITALS — BP 121/72 | HR 84 | Temp 98.1°F | Resp 18 | Ht 65.0 in | Wt 157.0 lb

## 2024-06-26 DIAGNOSIS — I5022 Chronic systolic (congestive) heart failure: Secondary | ICD-10-CM

## 2024-06-26 DIAGNOSIS — J9611 Chronic respiratory failure with hypoxia: Secondary | ICD-10-CM

## 2024-06-26 DIAGNOSIS — E538 Deficiency of other specified B group vitamins: Secondary | ICD-10-CM

## 2024-06-26 DIAGNOSIS — R600 Localized edema: Secondary | ICD-10-CM | POA: Diagnosis not present

## 2024-06-26 DIAGNOSIS — E8809 Other disorders of plasma-protein metabolism, not elsewhere classified: Secondary | ICD-10-CM

## 2024-06-26 DIAGNOSIS — I83008 Varicose veins of unspecified lower extremity with ulcer other part of lower leg: Secondary | ICD-10-CM | POA: Diagnosis not present

## 2024-06-26 DIAGNOSIS — L97801 Non-pressure chronic ulcer of other part of unspecified lower leg limited to breakdown of skin: Secondary | ICD-10-CM

## 2024-06-26 DIAGNOSIS — G9341 Metabolic encephalopathy: Secondary | ICD-10-CM

## 2024-06-26 MED ORDER — FOLIC ACID 1 MG PO TABS: 1.0000 mg | ORAL_TABLET | Freq: Every day | ORAL | 2 refills | Status: DC

## 2024-06-26 MED ORDER — TORSEMIDE 20 MG PO TABS: 20.0000 mg | ORAL_TABLET | Freq: Every day | ORAL | 0 refills | Status: DC

## 2024-06-26 NOTE — Patient Outreach (Signed)
 Complex Care Management   Visit Note  06/26/2024  Name:  Abigail [name] Abigail [name] MRN: [id number] DOB: 12-12-1965  Situation: Referral received for Complex Care Management related to Mental/Behavioral Health diagnosis depression, caregiver stress, in home care support. I obtained verbal consent from Patient. Visit completed with Parent on the phone Patient was present by speaker  Background:   Past Medical History:  Diagnosis Date   Anxiety    Arthritis    Back pain    Basal cell carcinoma 10/04/2021   R axilla - needs excised 11/28/21   Basal cell carcinoma 10/04/2021   L antecubital excised 11/14/21   Diarrhea 11/12/2016   Fibromyalgia    Generalized abdominal pain 11/12/2016   H. pylori infection    Hyperlipidemia    IBS (irritable bowel syndrome)    Infectious colitis 04/29/2016   Migraines    Moderate dehydration 04/29/2016   Muscle pain    Opioid overdose (HCC) 12/07/2022   Reflux    Seizures (HCC)    Unexplained weight loss 11/12/2016    Assessment: Patient Reported Symptoms:  Cognitive Cognitive Status: Requires Assistance Decision Making, Difficulties with attention and concentration, Normal speech and language skills Cognitive/Intellectual Conditions Management [RPT]: Other Other: History of seizure disorder   Health Maintenance Behaviors: Annual physical exam Health Facilitated by: Healthy diet  Neurological Neurological Review of Symptoms: No symptoms reported    HEENT HEENT Symptoms Reported: No symptoms reported      Cardiovascular Cardiovascular Symptoms Reported: Other: Other Cardiovascular Symptoms: conitnues to wear UNNA Boots, HHRN continues to come 1-2 times per week Does patient have uncontrolled Hypertension?: No Cardiovascular Management Strategies: Routine screening Do You Have a Working Readable Scale?: Yes Weight: 158 lb (71.7 kg) Cardiovascular Self-Management Outcome: 3 (uncertain) Cardiovascular Comment: Reports she is weighing  daily  Respiratory Respiratory Symptoms Reported: No symptoms reported    Endocrine Endocrine Symptoms Reported: No symptoms reported    Gastrointestinal Gastrointestinal Symptoms Reported: No symptoms reported      Genitourinary Genitourinary Symptoms Reported: Urgency Genitourinary Management Strategies: Incontinence garment/pad  Integumentary Integumentary Symptoms Reported: No symptoms reported    Musculoskeletal Musculoskelatal Symptoms Reviewed: Back pain, Limited mobility Additional Musculoskeletal Details: back pain remains a challenge-followed by pain clinic-hoping to get insurance approval for a nerve block-uses a walker and rollator walker for ambulation Musculoskeletal Management Strategies: Medical device, Routine screening      Psychosocial Psychosocial Symptoms Reported: Depression - if selected complete PHQ 2-9 Other Psychosocial Conditions: scheduled with [facility]-Dr. Eappen 06/29/24   Major Change/Loss/Stressor/Fears (CP): Medical condition, self Behaviors When Feeling Stressed/Fearful: medication management Techniques to Cope with Loss/Stress/Change: Diversional activities Quality of Family Relationships: helpful, involved, supportive Do you feel physically threatened by others?: No    06/26/2024    PHQ2-9 Depression Screening   Little interest or pleasure in doing things    Feeling down, depressed, or hopeless    PHQ-2 - Total Score    Trouble falling or staying asleep, or sleeping too much    Feeling tired or having little energy    Poor appetite or overeating     Feeling bad about yourself - or that you are a failure or have let yourself or your family down    Trouble concentrating on things, such as reading the newspaper or watching television    Moving or speaking so slowly that other people could have noticed.  Or the opposite - being so fidgety or restless that you have been moving around a lot more than usual  Thoughts that you  would be better off dead, or hurting yourself in some way    PHQ2-9 Total Score    If you checked off any problems, how difficult have these problems made it for you to do your work, take care of things at home, or get along with other people    Depression Interventions/Treatment      There were no vitals filed for this visit.  Medications Reviewed Today     Reviewed by Ermalinda Lenn HERO, LCSW (Social Worker) on 06/26/24 at 1413  Med List Status: <None>   Medication Order Taking? Sig Documenting Provider Last Dose Status Informant  acetaminophen  (TYLENOL ) 500 MG tablet [id number] Yes Take 1,000 mg by mouth every 6 (six) hours as needed for moderate pain (pain score 4-6). [provider]  Active Mother  albuterol  (VENTOLIN  HFA) 108 (90 Base) MCG/ACT inhaler [id number] Yes Inhale 2 puffs into the lungs. Take two (2) puffs by mouth every 4 to 6 hours as needed for wheezing or shortness of breath [provider]  Active Mother           Med Note GAYLENE DARVIN JINNY Austin May 03, 2024 10:24 PM)    SONJIA JESSE SCHLOSSMAN MEDICATION [id number] Yes Knee-high, medium compression, graduated compression stockings. Apply to lower extremities. [email or web address], Zippered Compression Stockings, medium circ, long length [name], Susan K, MD  Active Mother  bisacodyl  (DULCOLAX) 5 MG EC tablet [id number] Yes Take 1 tablet (5 mg total) by mouth daily as needed for moderate constipation. Maree Hue, MD  Active Mother           Med Note GAYLENE, ANJA J   Sun May 03, 2024 10:26 PM)    carbamazepine  (TEGRETOL  XR) 100 MG 12 hr tablet [id number] Yes Take 1 tablet (100 mg total) by mouth 2 (two) times daily. Wouk, Devaughn Sayres, MD  Active Mother  Cholecalciferol  (VITAMIN D -3 PO) [id number] Yes Take 1 capsule by mouth every morning. [provider]  Active Mother  collagenase  (SANTYL ) 250 UNIT/GM ointment [id number]  Apply 1 Application topically daily.  Patient not taking: Reported on 06/26/2024    [name], Susan K, MD  Active Mother  DULoxetine  (CYMBALTA ) 60 MG capsule [id number] Yes Take 1 capsule (60 mg total) by mouth daily. [name], Susan K, MD  Active Mother  feeding supplement (ENSURE PLUS HIGH PROTEIN) LIQD [id number] Yes Take 237 mLs by mouth 3 (three) times daily between meals. Von Bellis, MD  Active Mother  folic acid  (FOLVITE ) 1 MG tablet [id number] Yes Take 1 tablet (1 mg total) by mouth daily. Von Bellis, MD  Active Mother  gabapentin  (NEURONTIN ) 300 MG capsule [id number] Yes Take 1 capsule (300 mg total) by mouth 4 (four) times daily. Maree Hue, MD  Active Mother           Med Note ZENA NATHANAEL CROME   Wed Jun 10, 2024 10:40 AM) Taking 2 in the morning and 2 at night  lamoTRIgine  (LAMICTAL ) 25 MG tablet [id number] Yes TAKE 2 TABLETS BY MOUTH 2 TIMES DAILY. [name], Susan K, MD  Active Mother  levETIRAcetam  (KEPPRA ) 750 MG tablet [id number] Yes Take 2 tablets (1,500 mg total) by mouth 2 (two) times daily. Kandis Devaughn Sayres, MD  Active Mother  Melatonin 10 MG TABS [id number] Yes Take 10 mg by mouth at bedtime. [provider]  Active Mother  midodrine  (PROAMATINE ) 5 MG tablet [id number] Yes Take 5 mg by mouth 3 (three) times daily with meals. [provider]  Active Mother  mirtazapine  (REMERON ) 7.5 MG tablet [id number] Yes Take 1 tablet (7.5 mg total) by mouth at bedtime. [name], Susan K, MD  Active Mother  morphine  (MSIR) 15 MG tablet [id number] Yes Take 1 tablet (15 mg total) by mouth every 8 (eight) hours as needed for severe pain (pain score 7-10). Jens Durand, MD  Active Mother           Med Note ZENA NATHANAEL CROME   Wed Jun 10, 2024 10:42 AM) [place] pain magmt dr said she could have up to 5 tabs a day  NON FORMULARY [id number] Yes Apply 1 Application topically 3 (three) times daily as needed. Rock Salt Cream (Similar to [employer]) [provider]  Active Mother  ondansetron  (ZOFRAN ) 4 MG tablet [id number] Yes Take 1 tablet (4 mg total) by mouth every 8  (eight) hours as needed for nausea or vomiting. [name], Susan K, MD  Active Mother  Pancrelipase , Lip-Prot-Amyl, 24000-76000 units CPEP [id number] Yes Take 1 capsule (24,000 Units total) by mouth 3 (three) times daily with meals. Dorinda Drue DASEN, MD  Active   pantoprazole  (PROTONIX ) 40 MG tablet [id number] Yes Take 1 tablet (40 mg total) by mouth daily. [name], Susan K, MD  Active Mother  potassium chloride  SA (KLOR-CON  M) 20 MEQ tablet [id number] Yes TAKE 1 TABLET BY MOUTH EVERY DAY [name], Susan K, MD  Active Mother  torsemide  (DEMADEX ) 20 MG tablet [id number] Yes Take 1 tablet (20 mg total) by mouth daily. [name], Susan K, MD  Active Mother            Recommendation:   PCP Follow-up Specialty provider follow-up as scheduled [facility] 06/29/24  Follow Up Plan:   Telephone follow up appointment date/time:  07/22/24  Lenn Mean, LCSW [place]  [facility] Licensed Clinical Social Worker  Direct Dial: [phone]

## 2024-06-26 NOTE — Patient Instructions (Signed)
 Visit Information  Thank you for taking time to visit with me today. Please don't hesitate to contact me if I can be of assistance to you before our next scheduled appointment.  Your next care management appointment is by telephone on 07/22/24 at 3pm  Please call the care guide team at [phone] if you need to cancel, schedule, or reschedule an appointment.   Please call the Suicide and Crisis Lifeline: 988 call the [place]  National Suicide Prevention Lifeline: (781) 555-0946 or TTY: 480-579-2297 TTY 769-773-4857) to talk to a trained counselor call 1-800-273-TALK (toll free, 24 hour hotline) call 911 if you are experiencing a Mental Health or Behavioral Health Crisis or need someone to talk to.  Willem Klingensmith, LCSW [place]  [facility], [facility] Licensed Clinical Social Worker  Direct Dial: [phone]

## 2024-06-27 NOTE — Assessment & Plan Note (Signed)
 In hospital albumin was down to 2.4.  Encouraging her to eat protein with every meal.  Will monitor.

## 2024-06-27 NOTE — Assessment & Plan Note (Signed)
 Have improved since she is wearing compression stockings.  Is planning to get thigh high compression stocking.

## 2024-06-27 NOTE — Progress Notes (Signed)
 Established Patient Office Visit  Subjective   Patient ID: Abigail [name] [facility], female    DOB: 01/08/1966  Age: 58 y.o. MRN: [id number]  Chief Complaint  Patient presents with   Medical Management of Chronic Issues    HPI Abigail [name] [facility] Abigail [name] is a 58 year old female with epilepsy (intractable epilepsy episode due to noncompliance), pulmonary embolism (on Eliquis ) , cachexia, COPD (emphysema, 10/23/2023 acute respiratory failure, O2 sat 84% when EMS picked her up, now on O2), RA, GCA (Bx proven, treated with steroids), hepatic steatosis, chronic pancreatitis (pancreatic pseudocyst), hypothyroidism, Chronic back pain, hypokalemia, IBS fibromyalgia, Hx aspiration pneumonia, depression/ anxiety, left hip pain and bilateral lower extremity edema, s/p cervical fixation at C5-6 and C 6-7.  She was hospitalized 10/21 through 10/24 for CHF with fluid overload, worsening of chronic hypoalbuminemia, acute metabolic encephalopathy that improved, chronic hypotension and worsening of chronic normocytic anemia. Her fluid overload had been building up for 3 to 4 weeks and did not respond to doubling the dose of her Lasix  and potassium to 40 mg of Lasix  daily and potassium 40 mEq daily.  She was treated with IV diuretics and lost 27 pounds.  Her O2 sat was 99% but she was given 4 L of oxygen .  Chest x-ray suggested pulmonary congestion and right lower lobe atelectasis.  CT scan confirmed pulmonary edema.  Albumin was 2.4, sodium 136 and potassium 4.0.  BUN 9 and creatinine 0.6 and glucose 97.  She was weaned off oxygen  while in the hospital.  She was discharged with to acetamide 20 mg 1 daily and potassium chloride  20 mEq 1 daily.  She had a CT of the head with contrast that showed no acute hemorrhage, no evidence of acute infarct, no hydrocephalus, no mass effect or midline shift.  She does show complete opacification of the left maxillary sinus.  She does not report mucopurulent discharge from her  nose, fever, facial tenderness. Since coming home from the hospital she has already gained 15 pounds.  Discussed salt in her diet and she and her mother both report that she is on a low-salt diet.  She is drinking 2 Pepsi's a day and eats canned green beans.  She is set up with PT, OT and nursing care in her home. She is not on oxygen  and denies being short of breath with ambulation.    Objective:     BP 121/72 (BP Location: Right Arm, Patient Position: Sitting, Cuff Size: Normal)   Pulse 84   Temp 98.1 F (36.7 C) (Oral)   Resp 18   Ht 5' 5 (1.651 m)   Wt 157 lb (71.2 kg)   SpO2 98%   BMI 26.13 kg/m    Physical Exam Vitals and nursing note reviewed.  Constitutional:      Appearance: Normal appearance.  HENT:     Head: Normocephalic and atraumatic.  Eyes:     Conjunctiva/sclera: Conjunctivae normal.  Cardiovascular:     Rate and Rhythm: Normal rate and regular rhythm.  Pulmonary:     Effort: Pulmonary effort is normal.     Breath sounds: Normal breath sounds.  Musculoskeletal:     Right lower leg: No edema.     Left lower leg: No edema.  Skin:    General: Skin is warm and dry.  Neurological:     Mental Status: She is alert and oriented to person, place, and time.  Psychiatric:        Mood and Affect: Mood  normal.        Behavior: Behavior normal.        Thought Content: Thought content normal.        Judgment: Judgment normal.          No results found for any visits on 06/26/24.    The 10-year ASCVD risk score (Arnett DK, et al., 2019) is: 11.3%    Assessment & Plan:  Folate deficiency -     Folic Acid ; Take 1 tablet (1 mg total) by mouth daily.  Dispense: 30 tablet; Refill: 2  Peripheral edema -     Torsemide ; Take 1 tablet (20 mg total) by mouth daily.  Dispense: 30 tablet; Refill: 0  Chronic systolic congestive heart failure (HCC) Assessment & Plan: Recently hospitalized for fluid overload and had pulmonary edema confirmed by CT chest.    27  pounds was diuresed.  She has already gained 15 pounds back in the week since she has been out of the hospital.  She is on torsemide  20 mg and potassium chloride  20 mEq daily. She is wearing 20-30 pound compression stockings and she has not swelling in her legs or abdomen.  FOLLOW-UP in one week.   Double this to 40 mg to acetamide and 40 mEq of potassium chloride .  Weigh yourself daily and record your weights.  Am considering metolazone add on weekly to control her fluid accumulation if she does not respond to double dose torsemide .  Reiterated low salt diet.  Ask her to get frozen green beans instead of canned ones.  Will contact home nurse Tosha at [phone]   Venous stasis ulcer of other part of lower leg limited to breakdown of skin with varicose veins, unspecified laterality (HCC) Assessment & Plan: Have improved since she is wearing compression stockings.  Is planning to get thigh high compression stocking.     Chronic respiratory failure with hypoxia (HCC) Assessment & Plan: Improved once diuresed.     Hypoalbuminemia Assessment & Plan: In hospital albumin was down to 2.4.  Encouraging her to eat protein with every meal.  Will monitor.     Acute metabolic encephalopathy Assessment & Plan: Normal CT of the head without contrast.  Symptoms improved and she diuresed.  Symptoms improved with oxygen  as well.         Peripheral neuropathy Chronic peripheral neuropathy with recent exacerbation causing significant discomfort. Currently managed with gabapentin , with a regimen of two 300 mg capsules in the morning, one in the afternoon, and two at night. - Continue current gabapentin  regimen.         No follow-ups on file.    Abigail Vullo K Layonna Dobie, MD

## 2024-06-27 NOTE — Assessment & Plan Note (Signed)
 Improved once diuresed.

## 2024-06-27 NOTE — Assessment & Plan Note (Addendum)
 Recently hospitalized for fluid overload and had pulmonary edema confirmed by CT chest.    27 pounds was diuresed.  She has already gained 15 pounds back in the week since she has been out of the hospital.  She is on torsemide  20 mg and potassium chloride  20 mEq daily. She is wearing 20-30 pound compression stockings and she has not swelling in her legs or abdomen.  FOLLOW-UP in one week.   Double this to 40 mg to acetamide and 40 mEq of potassium chloride .  Weigh yourself daily and record your weights.  Am considering metolazone add on weekly to control her fluid accumulation if she does not respond to double dose torsemide .  Reiterated low salt diet.  Ask her to get frozen green beans instead of canned ones.  Will contact home nurse Tosha at [phone]

## 2024-06-27 NOTE — Assessment & Plan Note (Signed)
 Normal CT of the head without contrast.  Symptoms improved and she diuresed.  Symptoms improved with oxygen  as well.

## 2024-06-29 ENCOUNTER — Ambulatory Visit: Admitting: Psychiatry

## 2024-06-29 ENCOUNTER — Other Ambulatory Visit: Payer: Self-pay

## 2024-06-29 ENCOUNTER — Encounter: Payer: Self-pay | Admitting: Psychiatry

## 2024-06-29 VITALS — BP 102/66 | HR 89 | Temp 97.5°F | Ht 65.0 in | Wt 155.8 lb

## 2024-06-29 DIAGNOSIS — F322 Major depressive disorder, single episode, severe without psychotic features: Secondary | ICD-10-CM | POA: Insufficient documentation

## 2024-06-29 DIAGNOSIS — F411 Generalized anxiety disorder: Secondary | ICD-10-CM | POA: Diagnosis not present

## 2024-06-29 DIAGNOSIS — G4701 Insomnia due to medical condition: Secondary | ICD-10-CM | POA: Diagnosis not present

## 2024-06-29 MED ORDER — MIRTAZAPINE 15 MG PO TABS: 15.0000 mg | ORAL_TABLET | Freq: Every day | ORAL | 0 refills | Status: DC

## 2024-06-29 NOTE — Progress Notes (Unsigned)
 Psychiatric Initial Adult Assessment   Patient Identification: Abigail [name] [facility] MRN:  [id number] Date of Evaluation:  06/29/2024 Referral Source: [name] MD Chief Complaint:   Chief Complaint  Patient presents with   Establish Care   Depression   Anxiety   Medication Refill   Insomnia   Visit Diagnosis:    ICD-10-CM   1. MDD (major depressive disorder), single episode, severe (HCC)  F32.2 mirtazapine  (REMERON ) 15 MG tablet    2. GAD (generalized anxiety disorder)  F41.1 mirtazapine  (REMERON ) 15 MG tablet    3. Insomnia due to medical condition  G47.01 mirtazapine  (REMERON ) 15 MG tablet   depression, anxiety , pain      Discussed the use of AI scribe software for clinical note transcription with the patient, who gave verbal consent to proceed.  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  She describes longstanding depression and connects it to chronic pain, loss of independence, and inability to engage in previously enjoyed activities. Persistent low mood, feelings of uselessness, and lack of achievement affect her daily life. Anhedonia impacts her, and she misses being active and able to work. Difficulty initiating her day arises from pain and low motivation. She has taken duloxetine  (Cymbalta ) 60 mg daily for over 20 years but questions its current effectiveness. She also takes mirtazapine  (Remeron ), which she started more recently, and notes it helps with appetite and sleep. For sleep, she uses melatonin and sometimes diphenhydramine (Benadryl).  Significant sleep  disturbance affects her, including difficulty falling asleep, frequent awakenings every 2 hours, and non-restorative sleep. Chronic fatigue and difficulty getting started in the morning persist. She connects some of her sleep issues to pain and medication side effects. At bedtime, she takes mirtazapine , melatonin, and sometimes diphenhydramine to aid sleep.  Ongoing anxiety manifests as internal restlessness, a sensation of her insides racing, and agitation related to her physical limitations. Irritability and frustration arise due to her inability to perform daily activities as she once did. Triggers include thinking about her health problems and limitations. She reports improvement in mobility and walking but continues to use a walker because her doctor recommended it. Fear of falling, particularly in the shower, leads her to avoid showering due to claustrophobia and fear of slipping; instead, she uses adult washcloths for bathing. She has a history of panic attacks, triggered by small spaces or crowded environments, with episodes lasting about 5 minutes. She denies recent panic attacks.  Cognitive symptoms, including brain fog and difficulty with concentration, concern her, and she connects these symptoms to her medications and possibly her rheumatoid arthritis. She expresses concern about the impact of these symptoms on her daily functioning and has discussed them with her neurologist. She reports that her neurologist mentioned the beginnings of mixed dementia or something similar, but she was not given detailed information about this diagnosis.  She follows up with neurologist Dr. Jannett Fairly.  She describes a history of visual hallucinations last year, which she connects to a previous seizure medication. After her neurologist adjusted her medications, she no longer experiences hallucinations. She denies current psychotic symptoms.  She denies suicidal ideation and self-harm behaviors. She reports  access to a firearm, which she  states is safely locked away.  She denies any manic or hypomanic symptoms.  Denies any obsessions or compulsive behaviors.   Psychiatric History:  Substance History:  Social History:  Family Psychiatric History: She reports no history of mental health problems, substance use disorders, or suicide in biological family members, including her mother, father, and sibling.  Medical History: Her medical history includes chronic back pain, rheumatoid arthritis, fibromyalgia, osteoarthritis, osteoporosis, hypothyroidism, epilepsy, and mixed dementia. Her past surgical history includes hysterectomy, appendectomy, multiple back surgeries (C5, C6, C7, L4, L5, S1 fusion), foot surgery, and hip arthroplasty. She also underwent esophagogastroduodenoscopy. She has a history of temporal arteritis with negative biopsy.   Associated Signs/Symptoms: Depression Symptoms:  depressed mood, anhedonia, insomnia, feelings of worthlessness/guilt, difficulty concentrating, anxiety, loss of energy/fatigue, disturbed sleep, (Hypo) Manic Symptoms:  Denies Anxiety Symptoms:  Excessive Worry, Psychotic Symptoms:  Denies PTSD Symptoms: Negative  Past Psychiatric History: She denies any history of hospitalizations, suicide attempts, or self-harm behaviors. She has not participated in therapy.  Multiple trials of psychotropics in the past.  Previous Psychotropic Medications: Yes venlafaxine, sertraline  Substance Abuse History in the last 12 months:  No. She currently uses tobacco, smoking approximately 1 to 1.5 packs of cigarettes per day. She is attempting to cut down and has started vaping with nicotine -containing products. She reports no other substances in the vape. She has a history of cannabis use, initially for anxiety and later for pain, with both CBD and THC. She describes cannabis as helpful but discontinued use due to cost and lack of interest; her last use was  approximately in 1989. She denies current or past alcohol use. She denies misuse or overuse of prescription medications. Consequences of Substance Abuse: Negative  Past Medical History:  Past Medical History:  Diagnosis Date   Anxiety    Arthritis    Back pain    Basal cell carcinoma 10/04/2021   R axilla - needs excised 11/28/21   Basal cell carcinoma 10/04/2021   L antecubital excised 11/14/21   Diarrhea 11/12/2016   Fibromyalgia    Generalized abdominal pain 11/12/2016   H. pylori infection    Hyperlipidemia    IBS (irritable bowel syndrome)    Infectious colitis 04/29/2016   Migraines    Moderate dehydration 04/29/2016   Muscle pain    Opioid overdose (HCC) 12/07/2022   Reflux    Seizures (HCC)    Unexplained weight loss 11/12/2016    Past Surgical History:  Procedure Laterality Date   ABDOMINAL HYSTERECTOMY     APPENDECTOMY  2009   C5 FUSION     C6 FUSION     C7 FUSION     COLONOSCOPY  02/2006   COLONOSCOPY WITH PROPOFOL  N/A 12/25/2016   Procedure: COLONOSCOPY WITH PROPOFOL ;  Surgeon: Dessa Reyes ORN, MD;  Location: [facility];  Service: Endoscopy;  Laterality: N/A;   ESOPHAGOGASTRODUODENOSCOPY (EGD) WITH PROPOFOL  N/A 12/25/2016   Procedure: ESOPHAGOGASTRODUODENOSCOPY (EGD) WITH PROPOFOL ;  Surgeon: Dessa Reyes ORN, MD;  Location: [facility] ENDOSCOPY;  Service: Endoscopy;  Laterality: N/A;   FOOT SURGERY     HIP ARTHROPLASTY     L4 FUSION     L5 FUSION     S1 FUSION      Family Psychiatric History: ***  Family History:  Family History  Problem Relation Age of Onset   Diabetes Father    Hypertension Father     Social History:   Social History   Socioeconomic History   Marital status: Divorced  Spouse name: Not on file   Number of children: 0   Years of education: Not on file   Highest education level: Associate degree: academic program  Occupational History   Not on file  Tobacco Use   Smoking status: Every Day    Current packs/day: 1.00     Types: Cigarettes    Passive exposure: Past   Smokeless tobacco: Never   Tobacco comments:    ELECTRONIC VAPOR  Vaping Use   Vaping status: Every Day   Substances: Nicotine   Substance and Sexual Activity   Alcohol use: Not Currently    Comment: OCCASIONALLY   Drug use: Not Currently    Types: Marijuana   Sexual activity: Not Currently  Other Topics Concern   Not on file  Social History Narrative   Not on file   Social Drivers of Health   Financial Resource Strain: Medium Risk (06/09/2024)   Overall Financial Resource Strain (CARDIA)    Difficulty of Paying Living Expenses: Somewhat hard  Food Insecurity: No Food Insecurity (06/15/2024)   Hunger Vital Sign    Worried About Running Out of Food in the Last Year: Never true    Ran Out of Food in the Last Year: Never true  Transportation Needs: No Transportation Needs (06/15/2024)   PRAPARE - Administrator, Civil Service (Medical): No    Lack of Transportation (Non-Medical): No  Physical Activity: Inactive (01/27/2024)   Received from [facility]   Exercise Vital Sign    On average, how many days per week do you engage in moderate to strenuous exercise (like a brisk walk)?: 0 days    On average, how many minutes do you engage in exercise at this level?: 0 min  Stress: Stress Concern Present (06/09/2024)   [employer] of Occupational Health - Occupational Stress Questionnaire    Feeling of Stress: To some extent  Social Connections: Moderately Isolated (01/27/2024)   Received from [facility]   Social Connection and Isolation Panel    In a typical week, how many times do you talk on the phone with family, friends, or neighbors?: More than three times a week    How often do you get together with friends or relatives?: Twice a week    How often do you attend [place] or religious services?: 1 to 4 times per year    Do you belong to any clubs or organizations such as [employer] groups,  unions, fraternal or athletic groups, or school groups?: No    How often do you attend meetings of the clubs or organizations you belong to?: Never    Are you married, widowed, divorced, separated, never married, or living with a partner?: Divorced    Additional Social History: She was born and raised in [place].  She was raised by both parents.  She had a good childhood.  She has 1 brother.  She completed a associate degree, accounting and business administration. She previously worked in paramedic and call center environments for approximately 12-13 years, including dietitian roles. She stopped working in 2011 due to disability. She is currently divorced and was previously married twice. She has no children but has 2 godchildren. She is currently staying with her mother in [place]; her godson is at her home caring for her dogs. She identifies as religious and believes in God. She has no history of military service or legal issues.  She reports access to a gun which is  safely locked away.  Allergies:   Allergies  Allergen Reactions   Nsaids Hives   Tapentadol Swelling, Rash and Other (See Comments)    Nucynta- Made her deathly sick   Cephalexin  Other (See Comments)    Reaction not cited   Codeine Other (See Comments)    Reaction not cited   Darvocet [Propoxyphene N-Acetaminophen ] Other (See Comments)    Reaction not cited   Latex Other (See Comments)    Reaction not cited   Silicone Other (See Comments)    Reaction not cited   Sulfa Antibiotics Hives   Tape Other (See Comments)    Reaction not cited   Meloxicam Rash    Metabolic Disorder Labs: Lab Results  Component Value Date   HGBA1C 5.2 05/13/2024   MPG 111.15 12/24/2022   No results found for: PROLACTIN Lab Results  Component Value Date   CHOL 151 09/30/2023   TRIG 142 09/30/2023   HDL 23 (L) 09/30/2023   CHOLHDL 6.6 09/30/2023   VLDL 28 09/30/2023   LDLCALC 100 (H) 09/30/2023   Lab Results   Component Value Date   TSH 1.647 06/09/2024    Therapeutic Level Labs: No results found for: LITHIUM Lab Results  Component Value Date   CBMZ <2.0 (L) 12/29/2023   No results found for: VALPROATE  Current Medications: Current Outpatient Medications  Medication Sig Dispense Refill   acetaminophen  (TYLENOL ) 500 MG tablet Take 1,000 mg by mouth every 6 (six) hours as needed for moderate pain (pain score 4-6).     albuterol  (VENTOLIN  HFA) 108 (90 Base) MCG/ACT inhaler Inhale 2 puffs into the lungs. Take two (2) puffs by mouth every 4 to 6 hours as needed for wheezing or shortness of breath     bisacodyl  (DULCOLAX) 5 MG EC tablet Take 1 tablet (5 mg total) by mouth daily as needed for moderate constipation. 30 tablet 0   carbamazepine  (TEGRETOL  XR) 100 MG 12 hr tablet Take 1 tablet (100 mg total) by mouth 2 (two) times daily. 60 tablet 2   Cholecalciferol  (VITAMIN D -3 PO) Take 1 capsule by mouth every morning.     DULoxetine  (CYMBALTA ) 60 MG capsule Take 1 capsule (60 mg total) by mouth daily. 30 capsule 2   feeding supplement (ENSURE PLUS HIGH PROTEIN) LIQD Take 237 mLs by mouth 3 (three) times daily between meals. (Patient taking differently: Take 237 mLs by mouth 3 (three) times daily between meals. boost)     folic acid  (FOLVITE ) 1 MG tablet Take 1 tablet (1 mg total) by mouth daily. 30 tablet 2   gabapentin  (NEURONTIN ) 300 MG capsule Take 1 capsule (300 mg total) by mouth 4 (four) times daily. 120 capsule 0   lamoTRIgine  (LAMICTAL ) 25 MG tablet TAKE 2 TABLETS BY MOUTH 2 TIMES DAILY. 120 tablet 0   levETIRAcetam  (KEPPRA ) 750 MG tablet Take 2 tablets (1,500 mg total) by mouth 2 (two) times daily. 120 tablet 1   Melatonin 10 MG TABS Take 10 mg by mouth at bedtime.     midodrine  (PROAMATINE ) 5 MG tablet Take 5 mg by mouth 3 (three) times daily with meals.     mirtazapine  (REMERON ) 15 MG tablet Take 1 tablet (15 mg total) by mouth at bedtime. Dose increase 30 tablet 0   NON FORMULARY  Apply 1 Application topically 3 (three) times daily as needed. Rock Salt Cream (Similar to [employer])     ondansetron  (ZOFRAN ) 4 MG tablet Take 1 tablet (4 mg total) by mouth every  8 (eight) hours as needed for nausea or vomiting. 30 tablet 1   Oxycodone  HCl 10 MG TABS Take 10 mg by mouth every 4 (four) hours as needed.     Pancrelipase , Lip-Prot-Amyl, 24000-76000 units CPEP Take 1 capsule (24,000 Units total) by mouth 3 (three) times daily with meals. 300 capsule 0   pantoprazole  (PROTONIX ) 40 MG tablet Take 1 tablet (40 mg total) by mouth daily. 90 tablet 1   potassium chloride  SA (KLOR-CON  M) 20 MEQ tablet TAKE 1 TABLET BY MOUTH EVERY DAY 90 tablet 1   torsemide  (DEMADEX ) 20 MG tablet Take 1 tablet (20 mg total) by mouth daily. 30 tablet 0   AMBULATORY NON FORMULARY MEDICATION Knee-high, medium compression, graduated compression stockings. Apply to lower extremities. W[email or web address], Zippered Compression Stockings, medium circ, long length 1 each 0   collagenase  (SANTYL ) 250 UNIT/GM ointment Apply 1 Application topically daily. (Patient not taking: Reported on 06/29/2024) 15 g 3   No current facility-administered medications for this visit.    Musculoskeletal: Strength & Muscle Tone: within normal limits Gait & Station: normal Patient leans: N/A  Psychiatric Specialty Exam: Review of Systems  Psychiatric/Behavioral:  Positive for decreased concentration, dysphoric mood and sleep disturbance. The patient is nervous/anxious.     Blood pressure 102/66, pulse 89, temperature (!) 97.5 F (36.4 C), temperature source Temporal, height 5' 5 (1.651 m), weight 155 lb 12.8 oz (70.7 kg).Body mass index is 25.93 kg/m.  General Appearance: Casual  Eye Contact:  Fair  Speech:  Clear and Coherent  Volume:  Normal  Mood:  Anxious and Depressed  Affect:  Congruent  Thought Process:  Goal Directed and Descriptions of Associations: Intact  Orientation:  Full (Time, Place, and Person)  Thought  Content:  Rumination  Suicidal Thoughts:  No  Homicidal Thoughts:  No  Memory:  Immediate;   Fair Recent;   Fair Remote;   Poor  Judgement:  Fair  Insight:  Fair  Psychomotor Activity:  Normal  Concentration:  Concentration: Poor and Attention Span: Poor  Recall:  Fair  Fund of Knowledge:Fair  Language: Fair  Akathisia:  No  Handed:  Right  AIMS (if indicated):  not done  Assets:  Communication Skills Desire for Improvement Housing Social Support Transportation  ADL's:  Intact  Cognition: Impaired,  Mild  Sleep:  Poor   Screenings: GAD-7    Flowsheet Row Office Visit from 06/26/2024 in [facility] [place] Office Visit from 02/11/2024 in [facility] Visit from 01/17/2024 in [facility] at [facility] Visit from 12/24/2023 in [facility] at [facility] Visit from 09/18/2023 in [facility] at [facility]  Total GAD-7 Score 12 14 5 14 12    PHQ2-9    Flowsheet Row Office Visit from 06/26/2024 in [facility] at [facility] Visit from 06/08/2024 in [facility] [place] Patient Outreach Telephone from 05/22/2024 in [place] [facility] Office Visit from 02/11/2024 in [facility] Care at [facility] Visit from 01/17/2024 in [facility] at [facility] Total Score 4 4 6 6 4   PHQ-9 Total Score 21 5 18 21 19    Flowsheet Row ED to Hosp-Admission (Discharged) from 06/09/2024 in [facility] ED to Hosp-Admission (Discharged) from 05/03/2024 in [facility] (1A) ED from 04/07/2024 in [facility] Emergency Department at  [facility]  C-SSRS RISK CATEGORY No Risk No Risk No Risk    Assessment and Plan: Kelaiah [facility]uma [facility] is a 58 year old Caucasian female who presented to establish care, discussed assessment and plan as noted  below. Assessment & Plan MDD single episode,severe-unstable Chronic depression is exacerbated by chronic pain, rheumatoid arthritis, fibromyalgia, and intractable epilepsy. Insomnia involves difficulty falling asleep and frequent awakenings. Current medications include duloxetine , mirtazapine , and gabapentin , with mirtazapine  also aiding sleep and appetite. Depression is multifactorial.  Increase mirtazapine  to 15 mg at bedtime. Will coordinate care with Dr. Maree, neurologist regarding changing duloxetine  to Prozac. Recommended psychotherapy for cognitive behavioral therapy and stress management.   Generalized anxiety disorder-unstable Anxiety symptoms include internal racing and agitation, likely related to chronic pain and functional limitations. Current medications, mirtazapine  and gabapentin , also have anxiolytic properties. Anxiety is multifactorial, related to chronic health issues and medication side effects  Increased Mirtazapine  to 15 mg at bedtime for anxiety.  Recommended psychotherapy for cognitive behavioral therapy and stress management.  Insomnia-unstable Sleep problems multifactorial including chronic pain, anxiety. Increase mirtazapine  to 15 mg at bedtime Patient currently on polypharmacy, on multiple antiseizure medications like Lamictal , gabapentin  and carbamazepine  which are also mood stabilizers.  Patient provided education.  I have sent communication to neurology, to coordinate care regarding changing Cymbalta  to fluoxetine given her current history of seizures as well as fluoxetine could interact with her medications/antiseizure medications.  I have reviewed labs as well as notes per neurology Dr. Rolan TSH-06/10/2019 25-1.647.  Within normal limits.  Vitamin B12 low at 306.  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  42/40 ratio.  Seen in patients with underlying neurodegenerative disorder.   Follow-up Follow-up in  clinic in 2 weeks or sooner if needed.   Collaboration of Care: Referral or follow-up with counselor/therapist AEB patient encouraged to establish care with therapist.  I have communicated with staff to schedule this patient with in-house therapist.  Patient/Guardian was advised Release of Information must be obtained prior to any record release in order to collaborate their care with an outside provider. Patient/Guardian was advised if they have not already done so to contact the registration department to sign all necessary forms in order for us  to release information regarding their care.   Consent: Patient/Guardian gives verbal consent for treatment and assignment of benefits for services provided during this visit. Patient/Guardian expressed understanding and agreed to proceed.  This note was generated in part or whole with voice recognition software. Voice recognition is usually quite accurate but there are transcription errors that can and very often do occur. I apologize for any typographical errors that were not detected and corrected.    Abigail Vanwinkle, MD 11/10/20252:46 PM

## 2024-06-29 NOTE — Patient Instructions (Signed)
 Mirtazapine  Tablets What is this medication? MIRTAZAPINE  (mir TAZ a peen) treats depression. It increases the amount of serotonin and norepinephrine in the brain, substances that help regulate mood. This medicine may be used for other purposes; ask your health care provider or pharmacist if you have questions. COMMON BRAND NAME(S): Remeron  What should I tell my care team before I take this medication? They need to know if you have any of these conditions: Bipolar disorder Dehydration Glaucoma Have had a stroke Heart or blood vessel conditions Kidney disease Liver disease Low blood pressure Low levels of sodium in the blood Low white blood cell levels Seizures Suicidal thoughts, plans, or attempt An unusual or allergic reaction to mirtazapine , other medications, foods, dyes, or preservatives Pregnant or trying to get pregnant Breastfeeding How should I use this medication? Take this medication by mouth with water. Take it as directed on the prescription label at the same time every day. You can take it with or without food. If it upsets your stomach, take it with food. Keep taking this medication unless your care team tells you to stop. Stopping it too quickly can cause serious side effects. It can also make your condition worse. A special MedGuide will be given to you by the pharmacist with each prescription and refill. Be sure to read this information carefully each time. Talk to your care team about the use of this medication in children. Special care may be needed. Overdosage: If you think you have taken too much of this medicine contact a poison control center or emergency room at once. NOTE: This medicine is only for you. Do not share this medicine with others. What if I miss a dose? If you miss a dose, take it as soon as you can. If it is almost time for your next dose, take only that dose. Do not take double or extra doses. What may interact with this medication? Do not take  this medication with any of the following: Cisapride Dronedarone Levoketoconazole Linezolid MAOIs, such as Marplan, Nardil, and Parnate Methylene blue Pimozide Some medications for fungal infections, such as ketoconazole, posaconazole, voriconazole Thioridazine This medication may also interact with the following: Alcohol Benzodiazepines, such as lorazepam Cimetidine Clarithromycin Other medications that cause heart rhythm changes Opioids Rifampin Some medications for depression, anxiety, or other mental health conditions Some medication for migraines, such as sumatriptan Some medications for seizures, such as carbamazepine or phenytoin Stimulant medications for ADHD, weight loss, or staying awake Supplements, such as St. John's wort or tryptophan Warfarin Other medications may affect the way this medication works. Talk with your care team about all the medications you take. They may suggest changes to your treatment plan to lower the risk of side effects and to make sure your medications work as intended. This list may not describe all possible interactions. Give your health care provider a list of all the medicines, herbs, non-prescription drugs, or dietary supplements you use. Also tell them if you smoke, drink alcohol, or use illegal drugs. Some items may interact with your medicine. What should I watch for while using this medication? Visit your care team for regular checks on your progress. It may be some time before you see the benefit from this medication. This medication may worsen depression and cause thoughts of suicide. This can happen at any time but is more common after first starting treatment and after a change in dose. Talk to your care team right away if you have changes in mood and behavior or  thoughts of self-harm or suicide. They can help you. This medication may affect your coordination, reaction time, or judgment. Do not drive or operate machinery until you know  how this medication affects you. Sit up or stand slowly to reduce the risk of dizzy or fainting spells. Drinking alcohol with this medication can increase the risk of these side effects. Your mouth may get dry. Chewing sugarless gum or sucking hard candy and drinking plenty of water may help. Contact your care team if the problem does not go away or is severe. What side effects may I notice from receiving this medication? Side effects that you should report to your care team as soon as possible: Allergic reactions--skin rash, itching, hives, swelling of the face, lips, tongue, or throat Heart rhythm changes--fast or irregular heartbeat, dizziness, feeling faint or lightheaded, chest pain, trouble breathing Infection--fever, chills, cough, or sore throat Irritability, confusion, fast or irregular heartbeat, muscle stiffness, twitching muscles, sweating, high fever, seizure, chills, vomiting, diarrhea, which may be signs of serotonin syndrome Low sodium level--muscle weakness, fatigue, dizziness, headache, confusion Rash, fever, and swollen lymph nodes Redness, blistering, peeling, or loosening of the skin, including inside the mouth Seizures Sudden eye pain or change in vision such as blurry vision, seeing halos around lights, vision loss Thoughts of suicide or self-harm, worsening mood, feelings of depression Side effects that usually do not require medical attention (report these to your care team if they continue or are bothersome): Constipation Dizziness Drowsiness Dry mouth Increase in appetite Weight gain This list may not describe all possible side effects. Call your doctor for medical advice about side effects. You may report side effects to FDA at 1-800-FDA-1088. Where should I keep my medication? Keep out of the reach of children and pets. Store at room temperature between 20 and 25 degrees C (68 and 77 degrees F). Protect from light and moisture. Keep the container tightly closed.  Get rid of any unused medication after the expiration date. To get rid of medications that are no longer needed or have expired: Take the medication to a take-back program. Check with your pharmacy or law enforcement to find a location. If you cannot return the medication, check the label or package insert to see if the medication should be thrown out in the garbage or flushed down the toilet. If you are not sure, ask your care team. If it is safe to put it in the trash, empty the medication out of the container. Mix it with cat litter, dirt, coffee grounds, or another unwanted substance. Seal the mixture in a bag or container. Put it in the trash. NOTE: This sheet is a summary. It may not cover all possible information. If you have questions about this medicine, talk to your doctor, pharmacist, or health care provider.  2025 Elsevier/Gold Standard (2023-12-04 00:00:00)

## 2024-06-30 ENCOUNTER — Encounter: Payer: Self-pay | Admitting: Psychiatry

## 2024-07-01 ENCOUNTER — Other Ambulatory Visit: Payer: Self-pay | Admitting: Pharmacist

## 2024-07-01 ENCOUNTER — Telehealth: Payer: Self-pay

## 2024-07-01 DIAGNOSIS — E876 Hypokalemia: Secondary | ICD-10-CM | POA: Diagnosis not present

## 2024-07-01 DIAGNOSIS — F32A Depression, unspecified: Secondary | ICD-10-CM | POA: Diagnosis not present

## 2024-07-01 DIAGNOSIS — F419 Anxiety disorder, unspecified: Secondary | ICD-10-CM | POA: Diagnosis not present

## 2024-07-01 DIAGNOSIS — I872 Venous insufficiency (chronic) (peripheral): Secondary | ICD-10-CM | POA: Diagnosis not present

## 2024-07-01 DIAGNOSIS — M7989 Other specified soft tissue disorders: Secondary | ICD-10-CM

## 2024-07-01 DIAGNOSIS — L03119 Cellulitis of unspecified part of limb: Secondary | ICD-10-CM | POA: Diagnosis not present

## 2024-07-01 DIAGNOSIS — G43909 Migraine, unspecified, not intractable, without status migrainosus: Secondary | ICD-10-CM | POA: Diagnosis not present

## 2024-07-01 DIAGNOSIS — J449 Chronic obstructive pulmonary disease, unspecified: Secondary | ICD-10-CM | POA: Diagnosis not present

## 2024-07-01 DIAGNOSIS — I959 Hypotension, unspecified: Secondary | ICD-10-CM

## 2024-07-01 DIAGNOSIS — I5030 Unspecified diastolic (congestive) heart failure: Secondary | ICD-10-CM | POA: Diagnosis not present

## 2024-07-01 DIAGNOSIS — E538 Deficiency of other specified B group vitamins: Secondary | ICD-10-CM | POA: Diagnosis not present

## 2024-07-01 NOTE — Telephone Encounter (Signed)
 Verbal orders given

## 2024-07-01 NOTE — Progress Notes (Unsigned)
 07/01/2024 Name: Abigail [name] [facility] MRN: [id number] DOB: Jan 29, 1966  Chief Complaint  Patient presents with   Medication Management   Medication Adherence    Abigail [name] is a 58 y.o. year old female who presented for a telephone visit.   They were referred to the pharmacist by their PCP for assistance in managing complex medication management.    Today speak with both patient and mom/caregiver Abigail [name] (listed on DPR in chart)     Subjective:   Care Team: Primary Care Provider: [name], Susan K, MD ; Next Scheduled Visit: 07/06/2024 Endocrinologist Abigail Calton Squires, PA Neurologist: Abigail Jannett Hering, MD; Next Scheduled Visit: 09/08/2024 Vascular Specialist: Abigail Marseille Gwendlyn SAUNDERS, NP; Next Scheduled Visit: 07/23/2024 Rheumatologist: Abigail Lady Plumb, MD; Next Scheduled Visit: 09/01/2024 Pain Specialist: Abigail Lot, MD Social Worker: Abigail [name], [place]; Next Scheduled Visit: 07/22/2024   Medication Access/Adherence  Current Pharmacy:  [id number] - [place], [place] - [address] [place] [address] Phone: [phone] Fax: [phone]  [name] [address] [place] [address] Phone: [phone] Fax: [phone]  [facility] [address] [place] [address] Phone: [phone] Fax: [phone]   Patient reports affordability concerns with their medications: No  Patient reports access/transportation concerns to their pharmacy: No  Patient reports adherence concerns with their medications:  No      Reports mother manages medications for patient. Fills medications into weekly pillbox - Reports that this strategy is working well; denies missed doses   From review of chart/confirm with patient and caregiver today, note Eliquis  discontinued by Dr. Babara as recommended on 06/08/2024    History of Lower Extremity  Swelling/Hypotension:   Patient followed by [facility]   Current medications: - torsemide  20 mg - 2 tablets (40 mg) daily each morning - Notes taking as directed by PCP with plan for 1 week follow up - potassium chloride  20 mEq- 2 tablets (40 mEq) daily - midodrine  5 mg - 1 tablet up to three times daily as needed (Systolic BP <100 mmHg).    Medications previously tried: furosemide    Patient has an automated, upper arm home BP cuff Recalls last checked:  Today, reading: ~100/68 *Took as needed dose of midodrine  11/11: 106/63, HR 85   Patient denies hypotensive s/sx including dizziness, lightheadedness.    Reports monitors daily weights at home as directed. Reports weight remaining stable (variation within 1-2 lbs)  Now using compression stockings and elevating her legs during the day   Current physical activity: reports currently limited to walking around in home and feels like strength and balance is improving. Reports using handrail and being cautious to avoid falling     Seizure Disorder:   Patient followed by [facility]   Current medications:  - carbamazepine  ER 100 mg twice daily - lamotrigine  25 mg - 2 tablets (50 mg) twice daily - levetiracetam  750 mg - 2 tablet (1500 mg) twice daily     Previous therapies tried: phenytoin , lacosamide     Objective:    Lab Results  Component Value Date   CREATININE 0.55 06/12/2024   BUN 7 06/12/2024   NA 145 06/12/2024   [name] 3.5 06/12/2024   CL 101 06/12/2024   CO2 35 (H) 06/12/2024    Medications Reviewed Today     Reviewed by Abigail [name], RPH-CPP (Pharmacist) on 07/01/24 at 1526  Med List Status: <  None>   Medication Order Taking? Sig Documenting Provider Last Dose Status Informant  acetaminophen  (TYLENOL ) 500 MG tablet [id number]  Take 1,000 mg by mouth every 6 (six) hours as needed for moderate pain (pain score 4-6). [provider]  Active Mother  albuterol  (VENTOLIN  HFA)  108 [phone] Base) MCG/ACT inhaler [id number]  Inhale 2 puffs into the lungs. Take two (2) puffs by mouth every 4 to 6 hours as needed for wheezing or shortness of breath [provider]  Active Mother           Med Note GAYLENE DARVIN JINNY Austin May 03, 2024 10:24 PM)    SONJIA JESSE SCHLOSSMAN MEDICATION [id number]  Knee-high, medium compression, graduated compression stockings. Apply to lower extremities. [email or web address], Zippered Compression Stockings, medium circ, long length [name], Susan K, MD  Active Mother  bisacodyl  (DULCOLAX) 5 MG EC tablet [id number]  Take 1 tablet (5 mg total) by mouth daily as needed for moderate constipation. Abigail Hue, MD  Active Mother           Med Note GAYLENE, DARVIN JINNY Austin May 03, 2024 10:26 PM)    carbamazepine  (TEGRETOL  XR) 100 MG 12 hr tablet [id number]  Take 1 tablet (100 mg total) by mouth 2 (two) times daily. Wouk, Devaughn Sayres, MD  Active Mother  Cholecalciferol  (VITAMIN D -3 PO) [id number]  Take 1 capsule by mouth every morning. [provider]  Active Mother  collagenase  (SANTYL ) 250 UNIT/GM ointment [id number]  Apply 1 Application topically daily.  Patient not taking: Reported on 06/29/2024   [name], Susan K, MD  Active Mother  DULoxetine  (CYMBALTA ) 60 MG capsule [id number] Yes Take 1 capsule (60 mg total) by mouth daily. [name], Susan K, MD  Active Mother  feeding supplement (ENSURE PLUS HIGH PROTEIN) LIQD [id number]  Take 237 mLs by mouth 3 (three) times daily between meals.  Patient taking differently: Take 237 mLs by mouth 3 (three) times daily between meals. boost   Von Bellis, MD  Active Mother  folic acid  (FOLVITE ) 1 MG tablet [id number]  Take 1 tablet (1 mg total) by mouth daily. [name], Susan K, MD  Active   gabapentin  (NEURONTIN ) 300 MG capsule [id number]  Take 1 capsule (300 mg total) by mouth 4 (four) times daily. Abigail Hue, MD  Active Mother           Med Note Abigail [name]   Wed Jun 10, 2024 10:40 AM) Taking 2 in the  morning and 2 at night  lamoTRIgine  (LAMICTAL ) 25 MG tablet [id number]  TAKE 2 TABLETS BY MOUTH 2 TIMES DAILY. [name], Susan K, MD  Active Mother  levETIRAcetam  (KEPPRA ) 750 MG tablet [id number]  Take 2 tablets (1,500 mg total) by mouth 2 (two) times daily. Kandis Devaughn Sayres, MD  Active Mother  Melatonin 10 MG TABS [id number]  Take 10 mg by mouth at bedtime. [provider]  Active Mother  midodrine  (PROAMATINE ) 5 MG tablet [id number] Yes Take 5 mg by mouth 3 (three) times daily with meals.  Patient taking differently: Take 5 mg by mouth 3 (three) times daily as needed.   [provider]  Active Mother  mirtazapine  (REMERON ) 15 MG tablet [id number]  Take 1 tablet (15 mg total) by mouth at bedtime. Dose increase Eappen, Saramma, MD  Active   NON FORMULARY [id number]  Apply 1 Application topically 3 (three) times daily as needed. Rock Salt Cream (Similar to [employer]) [provider]  Active Mother  ondansetron  (ZOFRAN ) 4  MG tablet [id number]  Take 1 tablet (4 mg total) by mouth every 8 (eight) hours as needed for nausea or vomiting. [name], Susan K, MD  Active Mother  Oxycodone  HCl 10 MG TABS [id number]  Take 10 mg by mouth every 4 (four) hours as needed. [provider]  Active   Pancrelipase , Lip-Prot-Amyl, 24000-76000 units CPEP [id number] Yes Take 1 capsule (24,000 Units total) by mouth 3 (three) times daily with meals. Dorinda Drue DASEN, MD  Active   pantoprazole  (PROTONIX ) 40 MG tablet [id number]  Take 1 tablet (40 mg total) by mouth daily. [name], Susan K, MD  Active Mother  potassium chloride  SA (KLOR-CON  M) 20 MEQ tablet [id number] Yes TAKE 1 TABLET BY MOUTH EVERY DAY  Patient taking differently: Take 40 mEq by mouth daily.   [name], Susan K, MD  Active Mother  torsemide  (DEMADEX ) 20 MG tablet [id number] Yes Take 1 tablet (20 mg total) by mouth daily.  Patient taking differently: Take 40 mg by mouth daily.   [name], Susan K, MD  Active                Assessment/Plan:   Comprehensive medication review performed; medication list updated in electronic medical record  Patient plans to download coupons for non-dairy Ensure or Boost from manufacturer website  - Caution patient for risk of dizziness/sedation with medications including oxycodone , gabapentin  and mirtazapine , particularly if taken in combination             Patient verbalizes understanding  Discuss importance of medication adherence. Patient/caregiver plan to continue using weekly pillbox method for now, but interested in discussing pill packaging option again in the future   History of Lower Extremity Swelling/Hypotension: - Recommend to continue to monitor home daily weights as recommended by providers - Recommend to monitor home blood pressure, keep log of results and have this record to review at upcoming medical appointments. Patient to contact provider office sooner if needed for readings outside of established parameters or symptoms    Seizure Disorder:  - Patient to attend follow up appointment with Neurologist in January as scheduled     Follow Up Plan: Clinical Pharmacist will follow up with patient by telephone 08/05/2024 at 2:00 PM    Sharyle Sia, PharmD, [facility] [phone]

## 2024-07-01 NOTE — Telephone Encounter (Signed)
 Copied from CRM [id number]. Topic: Clinical - Home Health Verbal Orders >> Jul 01, 2024  1:29 PM Terri MATSU wrote: Caller/Agency: [facility] Number: [phone] Service Requested:  Frequency: twice a week  Any new concerns about the patient? No

## 2024-07-03 ENCOUNTER — Telehealth: Payer: Self-pay

## 2024-07-03 DIAGNOSIS — L03119 Cellulitis of unspecified part of limb: Secondary | ICD-10-CM | POA: Diagnosis not present

## 2024-07-03 DIAGNOSIS — F419 Anxiety disorder, unspecified: Secondary | ICD-10-CM | POA: Diagnosis not present

## 2024-07-03 DIAGNOSIS — I872 Venous insufficiency (chronic) (peripheral): Secondary | ICD-10-CM | POA: Diagnosis not present

## 2024-07-03 DIAGNOSIS — I5030 Unspecified diastolic (congestive) heart failure: Secondary | ICD-10-CM | POA: Diagnosis not present

## 2024-07-03 DIAGNOSIS — F32A Depression, unspecified: Secondary | ICD-10-CM | POA: Diagnosis not present

## 2024-07-03 DIAGNOSIS — E538 Deficiency of other specified B group vitamins: Secondary | ICD-10-CM | POA: Diagnosis not present

## 2024-07-03 DIAGNOSIS — E876 Hypokalemia: Secondary | ICD-10-CM | POA: Diagnosis not present

## 2024-07-03 DIAGNOSIS — G43909 Migraine, unspecified, not intractable, without status migrainosus: Secondary | ICD-10-CM | POA: Diagnosis not present

## 2024-07-03 DIAGNOSIS — J449 Chronic obstructive pulmonary disease, unspecified: Secondary | ICD-10-CM | POA: Diagnosis not present

## 2024-07-03 NOTE — Patient Instructions (Signed)
 Check your blood pressure at least daily, and any time you have concerning symptoms like headache, chest pain, dizziness, shortness of breath, or vision changes.    To appropriately check your blood pressure, make sure you do the following:  1) Avoid caffeine, exercise, or tobacco products for 30 minutes before checking. Empty your bladder. 2) Sit with your back supported in a flat-backed chair. Rest your arm on something flat (arm of the chair, table, etc). 3) Sit still with your feet flat on the floor, resting, for at least 5 minutes.  4) Check your blood pressure. Take 1-2 readings.  5) Write down these readings and bring with you to any provider appointments.  Bring your home blood pressure machine with you to a provider's office for accuracy comparison at least once a year.   Make sure you take your blood pressure medications before you come to any office visit, even if you were asked to fast for labs.  Sharyle Sia, PharmD, [facility] [phone]

## 2024-07-06 ENCOUNTER — Encounter: Payer: Self-pay | Admitting: Family Medicine

## 2024-07-06 ENCOUNTER — Ambulatory Visit (INDEPENDENT_AMBULATORY_CARE_PROVIDER_SITE_OTHER): Admitting: Family Medicine

## 2024-07-06 VITALS — BP 114/61 | HR 95 | Temp 99.0°F | Resp 18 | Ht 65.0 in | Wt 153.0 lb

## 2024-07-06 DIAGNOSIS — G40909 Epilepsy, unspecified, not intractable, without status epilepticus: Secondary | ICD-10-CM | POA: Diagnosis not present

## 2024-07-06 DIAGNOSIS — E538 Deficiency of other specified B group vitamins: Secondary | ICD-10-CM

## 2024-07-06 DIAGNOSIS — Z23 Encounter for immunization: Secondary | ICD-10-CM

## 2024-07-06 DIAGNOSIS — I5022 Chronic systolic (congestive) heart failure: Secondary | ICD-10-CM | POA: Diagnosis not present

## 2024-07-06 DIAGNOSIS — K8681 Exocrine pancreatic insufficiency: Secondary | ICD-10-CM | POA: Diagnosis not present

## 2024-07-06 MED ORDER — POTASSIUM CHLORIDE CRYS ER 10 MEQ PO TBCR: 10.0000 meq | EXTENDED_RELEASE_TABLET | Freq: Two times a day (BID) | ORAL | 1 refills | Status: AC

## 2024-07-06 MED ORDER — FOLIC ACID 1 MG PO TABS: 1.0000 mg | ORAL_TABLET | Freq: Every day | ORAL | 2 refills | Status: DC

## 2024-07-06 MED ORDER — TORSEMIDE 20 MG PO TABS
20.0000 mg | ORAL_TABLET | Freq: Two times a day (BID) | ORAL | 3 refills | Status: AC
Start: 2024-07-06 — End: ?

## 2024-07-06 MED ORDER — PANCRELIPASE (LIP-PROT-AMYL) 24000-76000 UNITS PO CPEP: 24000.0000 [IU] | ORAL_CAPSULE | Freq: Three times a day (TID) | ORAL | 0 refills | Status: DC

## 2024-07-06 MED ORDER — LAMOTRIGINE 25 MG PO TABS: 50.0000 mg | ORAL_TABLET | Freq: Two times a day (BID) | ORAL | 1 refills | Status: AC

## 2024-07-08 DIAGNOSIS — I5030 Unspecified diastolic (congestive) heart failure: Secondary | ICD-10-CM | POA: Diagnosis not present

## 2024-07-08 DIAGNOSIS — F32A Depression, unspecified: Secondary | ICD-10-CM | POA: Diagnosis not present

## 2024-07-08 DIAGNOSIS — J449 Chronic obstructive pulmonary disease, unspecified: Secondary | ICD-10-CM | POA: Diagnosis not present

## 2024-07-08 DIAGNOSIS — L03119 Cellulitis of unspecified part of limb: Secondary | ICD-10-CM | POA: Diagnosis not present

## 2024-07-08 DIAGNOSIS — E876 Hypokalemia: Secondary | ICD-10-CM | POA: Diagnosis not present

## 2024-07-08 DIAGNOSIS — E538 Deficiency of other specified B group vitamins: Secondary | ICD-10-CM | POA: Diagnosis not present

## 2024-07-08 DIAGNOSIS — G43909 Migraine, unspecified, not intractable, without status migrainosus: Secondary | ICD-10-CM | POA: Diagnosis not present

## 2024-07-08 DIAGNOSIS — F419 Anxiety disorder, unspecified: Secondary | ICD-10-CM | POA: Diagnosis not present

## 2024-07-08 DIAGNOSIS — I872 Venous insufficiency (chronic) (peripheral): Secondary | ICD-10-CM | POA: Diagnosis not present

## 2024-07-08 NOTE — Assessment & Plan Note (Signed)
 She is now on torsemide  20 mg 2 a day and potassium chloride  10 mEq 2 a day.  If you gain more than 3 pounds overnight take an additional torsemide  20 mg and potassium chloride  10 mEq.  If you gain 6 pounds in a week take an additional torsemide  and potassium chloride 

## 2024-07-08 NOTE — Progress Notes (Signed)
 Established Patient Office Visit  Subjective   Patient ID: Tiamarie Furnari [facility], female    DOB: 09-Apr-1966  Age: 58 y.o. 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  She was hospitalized 10/21 through 10/24 for CHF exacerbation with fluid overload, worsening chronic hypoalbuminemia, acute metabolic encephalopathy that improved with diuresis chronic hypotension and worsening of chronic normocytic anemia. Discussed the use of AI scribe software for clinical note transcription with the patient, who gave verbal consent to proceed.  History of Present Illness   Shanikia [facility] is a 58 year old female who presents for follow-up on her current medications and symptoms.  She experiences fluid retention in her legs, which she believes has improved. She is currently taking 40 mg of torsemide  daily and 20 mg of potassium, which she doubles as needed. She wears leggings for comfort due to arthritis pain and is considering compression garments for her legs.  She wants to get leggings that are compression material versus thigh-high compression stockings.  She has a history of anemia and takes iron supplements. She plans to take ferrous sulfate with vitamin C to improve absorption. No significant gastrointestinal symptoms are reported, but she notes less  regular bowel movements when taking oxycodone  compared to morphine .  She mentions a recent weight fluctuation, with her weight currently at 151.8 pounds, fluctuating within a three-pound range. She recalls losing 27 pounds during her hospital stay and has since regained approximately 20 pounds. She wants an appetite suppressant due to increased hunger.  She is currently taking Creon  for pancreatitis and requires a refill. Additionally, she is on Lamictal  for seizures and is scheduled to see her neurologist in January.  Nursing care has been visiting her, but she has not received physical or occupational therapy since her last hospital visit.  She has not received a flu shot or pneumonia vaccine this year and is open to receiving them during this visit.        Objective:     BP 114/61 (BP Location: Left Arm, Patient Position: Sitting, Cuff Size: Normal)   Pulse 95   Temp 99 F (37.2 C) (Oral)   Resp 18   Ht 5' 5 (1.651 m)   Wt 153 lb (69.4 kg)   SpO2 98%   BMI 25.46 kg/m    Physical Exam Vitals and nursing note reviewed.  Constitutional:      Appearance: Normal appearance.  HENT:     Head: Normocephalic and atraumatic.  Eyes:     Conjunctiva/sclera: Conjunctivae normal.  Cardiovascular:     Rate and Rhythm: Normal rate and regular rhythm.  Pulmonary:     Effort: Pulmonary effort is normal.     Breath sounds: Normal breath sounds.  Musculoskeletal:     Right lower leg: No edema.     Left lower leg: No edema.  Skin:    General: Skin is warm and dry.  Neurological:     Mental Status: She is alert  and oriented to person, place, and time.  Psychiatric:        Mood and Affect: Mood normal.        Behavior: Behavior normal.        Thought Content: Thought content normal.        Judgment: Judgment normal.          No results found for any visits on 07/06/24.    The 10-year ASCVD risk score (Arnett DK, et al., 2019) is: 10.1%    Assessment & Plan:    Immunization due -     Pneumococcal conjugate vaccine 20-valent -     Flu vaccine trivalent PF, 6mos and older(Flulaval,Afluria,Fluarix,Fluzone)  Folate deficiency -     Folic Acid ; Take 1 tablet (1 mg total) by mouth daily.  Dispense: 90 tablet; Refill: 2  Chronic systolic congestive heart failure (HCC) Assessment & Plan: She is now on torsemide  20 mg 2 a day and potassium chloride  10 mEq 2 a day.  If you gain more than 3 pounds overnight take an additional torsemide  20 mg and potassium chloride  10 mEq.  If you gain 6 pounds in a week take an additional torsemide  and potassium chloride   Orders: -     Torsemide ; Take 1 tablet (20 mg total) by mouth 2 (two) times daily.  Dispense: 180 tablet; Refill: 3 -     Potassium Chloride  Crys ER; Take 1 tablet (10 mEq total) by mouth 2 (two) times daily.  Dispense: 180 tablet; Refill: 1  Nonintractable epilepsy without status epilepticus, unspecified epilepsy type (HCC) -     lamoTRIgine ; Take 2 tablets (50 mg total) by mouth 2 (two) times daily.  Dispense: 360 tablet; Refill: 1  Exocrine pancreatic insufficiency -     Pancrelipase  (Lip-Prot-Amyl); Take 1 capsule (24,000 Units total) by mouth 3 (three) times daily with meals.  Dispense: 300 capsule; Refill: 0     Return in about 4 weeks (around 08/03/2024).    Breeana Sawtelle K Brystal Kildow, MD

## 2024-07-09 ENCOUNTER — Telehealth: Payer: Self-pay | Admitting: Family Medicine

## 2024-07-09 ENCOUNTER — Telehealth: Payer: Self-pay

## 2024-07-09 DIAGNOSIS — I5030 Unspecified diastolic (congestive) heart failure: Secondary | ICD-10-CM | POA: Diagnosis not present

## 2024-07-09 DIAGNOSIS — G43909 Migraine, unspecified, not intractable, without status migrainosus: Secondary | ICD-10-CM | POA: Diagnosis not present

## 2024-07-09 DIAGNOSIS — J449 Chronic obstructive pulmonary disease, unspecified: Secondary | ICD-10-CM | POA: Diagnosis not present

## 2024-07-09 DIAGNOSIS — F419 Anxiety disorder, unspecified: Secondary | ICD-10-CM | POA: Diagnosis not present

## 2024-07-09 DIAGNOSIS — E876 Hypokalemia: Secondary | ICD-10-CM | POA: Diagnosis not present

## 2024-07-09 DIAGNOSIS — L03119 Cellulitis of unspecified part of limb: Secondary | ICD-10-CM | POA: Diagnosis not present

## 2024-07-09 DIAGNOSIS — I872 Venous insufficiency (chronic) (peripheral): Secondary | ICD-10-CM | POA: Diagnosis not present

## 2024-07-09 DIAGNOSIS — F32A Depression, unspecified: Secondary | ICD-10-CM | POA: Diagnosis not present

## 2024-07-09 DIAGNOSIS — E538 Deficiency of other specified B group vitamins: Secondary | ICD-10-CM | POA: Diagnosis not present

## 2024-07-09 NOTE — Telephone Encounter (Signed)
 Copied from CRM [id number]. Topic: Clinical - Home Health Verbal Orders >> Jul 09, 2024 10:39 AM Lonell PEDLAR wrote: Caller/Agency: [place], Nurse with [facility] R[facility] Number: [phone] Service Requested: Skilled Nursing Frequency: 2 times a week, 8 weeks. CHF and weight management Any new concerns about the patient? No

## 2024-07-09 NOTE — Telephone Encounter (Signed)
 LVM requesting return call to authorize verbal orders.

## 2024-07-10 NOTE — Telephone Encounter (Signed)
 Left VM requesting a return call regarding verbal orders.

## 2024-07-13 ENCOUNTER — Other Ambulatory Visit: Payer: Self-pay | Admitting: Family Medicine

## 2024-07-13 DIAGNOSIS — F321 Major depressive disorder, single episode, moderate: Secondary | ICD-10-CM

## 2024-07-14 ENCOUNTER — Telehealth (INDEPENDENT_AMBULATORY_CARE_PROVIDER_SITE_OTHER): Payer: Self-pay | Admitting: Psychiatry

## 2024-07-14 DIAGNOSIS — F322 Major depressive disorder, single episode, severe without psychotic features: Secondary | ICD-10-CM

## 2024-07-14 NOTE — Progress Notes (Signed)
 No response to call or text or video invite

## 2024-07-20 ENCOUNTER — Encounter: Payer: Self-pay | Admitting: Family Medicine

## 2024-07-20 ENCOUNTER — Ambulatory Visit: Admitting: Family Medicine

## 2024-07-20 VITALS — BP 133/73 | HR 88 | Resp 16 | Ht 65.0 in | Wt 155.0 lb

## 2024-07-20 DIAGNOSIS — G6289 Other specified polyneuropathies: Secondary | ICD-10-CM | POA: Diagnosis not present

## 2024-07-20 DIAGNOSIS — I509 Heart failure, unspecified: Secondary | ICD-10-CM

## 2024-07-20 DIAGNOSIS — I5022 Chronic systolic (congestive) heart failure: Secondary | ICD-10-CM

## 2024-07-20 DIAGNOSIS — R110 Nausea: Secondary | ICD-10-CM

## 2024-07-20 DIAGNOSIS — I2699 Other pulmonary embolism without acute cor pulmonale: Secondary | ICD-10-CM

## 2024-07-20 MED ORDER — ONDANSETRON HCL 4 MG PO TABS: 4.0000 mg | ORAL_TABLET | Freq: Three times a day (TID) | ORAL | 3 refills | Status: AC | PRN

## 2024-07-21 ENCOUNTER — Ambulatory Visit: Payer: Self-pay | Admitting: Family Medicine

## 2024-07-21 LAB — BASIC METABOLIC PANEL WITH GFR
BUN/Creatinine Ratio: 18 (ref 9–23)
BUN: 13 mg/dL (ref 6–24)
CO2: 28 mmol/L (ref 20–29)
Calcium: 9.7 mg/dL (ref 8.7–10.2)
Chloride: 93 mmol/L — ABNORMAL LOW (ref 96–106)
Creatinine, Ser: 0.71 mg/dL (ref 0.57–1.00)
Glucose: 69 mg/dL — ABNORMAL LOW (ref 70–99)
Potassium: 4.2 mmol/L (ref 3.5–5.2)
Sodium: 138 mmol/L (ref 134–144)
eGFR: 98 mL/min/1.73 (ref >59)

## 2024-07-22 ENCOUNTER — Other Ambulatory Visit: Payer: Self-pay | Admitting: *Deleted

## 2024-07-22 DIAGNOSIS — M47817 Spondylosis without myelopathy or radiculopathy, lumbosacral region: Secondary | ICD-10-CM | POA: Diagnosis not present

## 2024-07-23 ENCOUNTER — Ambulatory Visit (INDEPENDENT_AMBULATORY_CARE_PROVIDER_SITE_OTHER): Admitting: Vascular Surgery

## 2024-07-23 NOTE — Patient Instructions (Signed)
 Visit Information  Thank you for taking time to visit with me today. Please don't hesitate to contact me if I can be of assistance to you before our next scheduled appointment.  Your next care management appointment is by telephone on 08/18/24 at 10:30am    Please call the care guide team at [phone] if you need to cancel, schedule, or reschedule an appointment.   Please call the Suicide and Crisis Lifeline: 988 call the [place]  National Suicide Prevention Lifeline: 954-880-0914 or TTY: (579)624-3907 TTY (719)282-7701) to talk to a trained counselor call 1-800-273-TALK (toll free, 24 hour hotline) call 911 if you are experiencing a Mental Health or Behavioral Health Crisis or need someone to talk to.  Abigail Verret, LCSW [place]  [facility], [facility] Licensed Clinical Social Worker  Direct Dial: [phone]

## 2024-07-23 NOTE — Patient Outreach (Addendum)
 Complex Care Management   Visit Note  07/22/2024  Name:  Abigail [name] [facility] MRN: [id number] DOB: [birth year]  Situation: Referral received for Complex Care Management related to caregiver stress I obtained verbal consent from Patient.  Visit completed with Caregiver  on the phone  Background:   Past Medical History:  Diagnosis Date   Anxiety    Arthritis    Back pain    Basal cell carcinoma 10/04/2021   R axilla - needs excised 11/28/21   Basal cell carcinoma 10/04/2021   L antecubital excised 11/14/21   Diarrhea 11/12/2016   Fibromyalgia    Generalized abdominal pain 11/12/2016   H. pylori infection    Hyperlipidemia    IBS (irritable bowel syndrome)    Infectious colitis 04/29/2016   Migraines    Moderate dehydration 04/29/2016   Muscle pain    Opioid overdose (HCC) 12/07/2022   Reflux    Seizures (HCC)    Unexplained weight loss 11/12/2016    Assessment: Patient Reported Symptoms:  Cognitive Cognitive Status: Requires Assistance Decision Making, Normal speech and language skills Cognitive/Intellectual Conditions Management [RPT]: Other Other: history of seizure disorder   Health Maintenance Behaviors: Annual physical exam Healing Pattern: Slow Health Facilitated by: Healthy diet, Pain control  Neurological Neurological Review of Symptoms: No symptoms reported    HEENT HEENT Symptoms Reported: No symptoms reported      Cardiovascular Cardiovascular Symptoms Reported: Not assessed    Respiratory Respiratory Symptoms Reported: No symptoms reported    Endocrine Endocrine Symptoms Reported: No symptoms reported Is patient diabetic?: No    Gastrointestinal Gastrointestinal Symptoms Reported: Nausea Additional Gastrointestinal Details: doctor prescribed      Genitourinary Genitourinary Symptoms Reported: No symptoms reported    Integumentary Integumentary Symptoms Reported: No symptoms reported    Musculoskeletal Musculoskelatal Symptoms Reviewed:  Back pain Additional Musculoskeletal Details: radio frequency nerve block done today-neorapthy in feet continues        Psychosocial Psychosocial Symptoms Reported: Anxiety - if selected complete GAD Additional Psychological Details: active with [facility] for medication management          08/18/2024    PHQ2-9 Depression Screening   Little interest or pleasure in doing things    Feeling down, depressed, or hopeless    PHQ-2 - Total Score    Trouble falling or staying asleep, or sleeping too much    Feeling tired or having little energy    Poor appetite or overeating     Feeling bad about yourself - or that you are a failure or have let yourself or your family down    Trouble concentrating on things, such as reading the newspaper or watching television    Moving or speaking so slowly that other people could have noticed.  Or the opposite - being so fidgety or restless that you have been moving around a lot more than usual    Thoughts that you would be better off dead, or hurting yourself in some way    PHQ2-9 Total Score    If you checked off any problems, how difficult have these problems made it for you to do your work, take care of things at home, or get along with other people    Depression Interventions/Treatment      There were no vitals filed for this visit.    Medications Reviewed Today     Reviewed by Ermalinda Lenn HERO, LCSW (Social Worker) on 07/22/24 at 1717  Med List Status: <None>   Medication Order  Taking? Sig Documenting Provider Last Dose Status Informant  acetaminophen  (TYLENOL ) 500 MG tablet [id number] Yes Take 1,000 mg by mouth every 6 (six) hours as needed for moderate pain (pain score 4-6). [provider]  Active Mother  albuterol  (VENTOLIN  HFA) 108 [phone] Base) MCG/ACT inhaler [id number] Yes Inhale 2 puffs into the lungs. Take two (2) puffs by mouth every 4 to 6 hours as needed for wheezing or shortness of breath [provider]   Active Mother           Med Note GAYLENE DARVIN JINNY Austin May 03, 2024 10:24 PM)    SONJIA JESSE SCHLOSSMAN MEDICATION [id number] Yes Knee-high, medium compression, graduated compression stockings. Apply to lower extremities. [email or web address], Zippered Compression Stockings, medium circ, long length [name], Susan K, MD  Active Mother  bisacodyl  (DULCOLAX) 5 MG EC tablet [id number] Yes Take 1 tablet (5 mg total) by mouth daily as needed for moderate constipation. Maree Hue, MD  Active Mother           Med Note GAYLENE, DARVIN JINNY Austin May 03, 2024 10:26 PM)    carbamazepine  (TEGRETOL  XR) 100 MG 12 hr tablet [id number] Yes Take 1 tablet (100 mg total) by mouth 2 (two) times daily. Wouk, Devaughn Sayres, MD  Active Mother  Cholecalciferol  (VITAMIN D -3 PO) [id number] Yes Take 1 capsule by mouth every morning. [provider]  Active Mother  collagenase  (SANTYL ) 250 UNIT/GM ointment [id number]  Apply 1 Application topically daily.  Patient not taking: Reported on 07/22/2024   [name], Susan K, MD  Active Mother  DULoxetine  (CYMBALTA ) 60 MG capsule [id number] Yes Take 1 capsule (60 mg total) by mouth daily. [name], Susan K, MD  Active Mother  feeding supplement (ENSURE PLUS HIGH PROTEIN) LIQD [id number] Yes Take 237 mLs by mouth 3 (three) times daily between meals.  Patient taking differently: Take 237 mLs by mouth 3 (three) times daily between meals. boost   Von Bellis, MD  Active Mother  folic acid  (FOLVITE ) 1 MG tablet [id number]  Take 1 tablet (1 mg total) by mouth daily. [name], Susan K, MD  Active   gabapentin  (NEURONTIN ) 300 MG capsule [id number] Yes Take 1 capsule (300 mg total) by mouth 4 (four) times daily. Maree Hue, MD  Active Mother           Med Note ZENA NATHANAEL CROME   Wed Jun 10, 2024 10:40 AM) Taking 2 in the morning and 2 at night  lamoTRIgine  (LAMICTAL ) 25 MG tablet [id number] Yes Take 2 tablets (50 mg total) by mouth 2 (two) times daily. [name], Susan K, MD  Active   levETIRAcetam   (KEPPRA ) 750 MG tablet [id number] Yes Take 2 tablets (1,500 mg total) by mouth 2 (two) times daily. Kandis Devaughn Sayres, MD  Active Mother  Melatonin 10 MG TABS [id number] Yes Take 10 mg by mouth at bedtime. [provider]  Active Mother  midodrine  (PROAMATINE ) 5 MG tablet [id number] Yes Take 5 mg by mouth 3 (three) times daily with meals.  Patient taking differently: Take 5 mg by mouth 3 (three) times daily as needed.   [provider]  Active Mother  mirtazapine  (REMERON ) 15 MG tablet [id number]  Take 1 tablet (15 mg total) by mouth at bedtime. Dose increase Eappen, Saramma, MD  Active   NON FORMULARY [id number]  Apply 1 Application topically 3 (three) times daily as needed. Rock Salt Cream (Similar to [employer]) [provider]  Active Mother  ondansetron  (ZOFRAN ) 4 MG  tablet [id number] Yes Take 1 tablet (4 mg total) by mouth every 8 (eight) hours as needed for nausea or vomiting. [name], Susan K, MD  Active   Oxycodone  HCl 10 MG TABS [id number] Yes Take 10 mg by mouth every 4 (four) hours as needed. [provider]  Active   Pancrelipase , Lip-Prot-Amyl, 24000-76000 units CPEP [id number] Yes Take 1 capsule (24,000 Units total) by mouth 3 (three) times daily with meals. [name], Susan K, MD  Active   pantoprazole  (PROTONIX ) 40 MG tablet [id number] Yes Take 1 tablet (40 mg total) by mouth daily. [name], Susan K, MD  Active Mother  potassium chloride  (KLOR-CON  M) 10 MEQ tablet [id number] Yes Take 1 tablet (10 mEq total) by mouth 2 (two) times daily. [name], Susan K, MD  Active   torsemide  (DEMADEX ) 20 MG tablet [id number] Yes Take 1 tablet (20 mg total) by mouth 2 (two) times daily. [name], Susan K, MD  Active             Recommendation:   PCP Follow-up Specialty provider follow-up as scheduled Follow up with [facility]-follow up to be rescheduled  Follow Up Plan:   Telephone follow up appointment date/time:  08/18/24  Lenn Mean, LCSW [facility], [facility] Licensed Clinical Social Worker  Direct Dial: [phone]

## 2024-07-24 ENCOUNTER — Telehealth: Admitting: *Deleted

## 2024-07-24 DIAGNOSIS — G629 Polyneuropathy, unspecified: Secondary | ICD-10-CM | POA: Insufficient documentation

## 2024-07-24 NOTE — Assessment & Plan Note (Signed)
 She has no peripheral edema and her lung fields are clear today.  Will check a basic metabolic panel for renal function.  She is currently taking to acetamide 40 mg daily and potassium chloride  10 mEq twice daily.  Reports no PND or orthopnea.

## 2024-07-24 NOTE — Progress Notes (Signed)
 Established Patient Office Visit  Subjective   Patient ID: Abigail [name] [name], female    DOB: June 01, 1966  Age: 58 y.o. MRN: [id number]  Chief Complaint  Patient presents with   Follow-up    HPI Discussed the use of AI scribe software for clinical note transcription with the patient, who gave verbal consent to proceed.  History of Present Illness   Abigail [name] [facility] is a 58 year old female with peripheral neuropathy who presents with worsening leg and foot pain. She is accompanied by her mother, Abigail [name].  She experiences severe pain in her feet and legs, described as neuropathy-related, accompanied by itching and significant discomfort that disrupts her sleep. She is currently taking gabapentin  600 mg twice daily, which provides only partial relief. Various topical treatments such as Biofreeze and Roxalt have been tried without relief.  She manages fluid retention with torsemide  20 mg, two pills once a day, and potassium supplements, splitting the dose between morning and night. This regimen has helped maintain her weight within a five-pound range. She has been doubling her fluid medicine for about three weeks.  She has a history of a pulmonary embolism and was on Eliquis , which she stopped six weeks ago following her hematologist's advice. This was her first episode of pulmonary embolism.  She is trying to quit smoking by switching to vaping, which occasionally causes a cough that she feels 'down low' in her chest. No issues with breathing, even when lying down.      Abigail [name] [facility] is a 58 year old female with peripheral neuropathy who presents with worsening leg and foot pain. She is accompanied by her mother, Abigail [name].  She experiences severe pain in her feet and legs, described as neuropathy-related, accompanied by itching and significant discomfort that disrupts her sleep. She is currently taking gabapentin  600 mg twice daily, which provides  only partial relief. Various topical treatments such as Biofreeze and Roxalt have been tried without relief.  She manages fluid retention with torsemide  20 mg, two pills once a day, and potassium supplements, splitting the dose between morning and night. This regimen has helped maintain her weight within a five-pound range. She has been doubling her fluid medicine for about three weeks.  She recently experienced a sore throat that felt like 'swallowing glass' and was accompanied by a fever, lasting about six days. She only took two days off work during this illness.  She has a history of a pulmonary embolism and was on Eliquis , which she stopped six weeks ago following her hematologist's advice. This was her first episode of pulmonary embolism.  She is trying to quit smoking by switching to vaping, which occasionally causes a cough that she feels 'down low' in her chest. No issues with breathing, even when lying down.      Objective:     BP 133/73   Pulse 88   Resp 16   Ht 5' 5 (1.651 m)   Wt 155 lb (70.3 kg)   SpO2 92%   BMI 25.79 kg/m    Physical Exam Vitals and nursing note reviewed.  Constitutional:      Appearance: Normal appearance.  HENT:     Head: Normocephalic and atraumatic.  Eyes:     Conjunctiva/sclera: Conjunctivae normal.  Cardiovascular:     Rate and Rhythm: Normal rate and regular rhythm.  Pulmonary:     Effort: Pulmonary effort is normal.     Breath sounds: Normal breath sounds.  Musculoskeletal:  Right lower leg: No edema.     Left lower leg: No edema.  Skin:    General: Skin is warm and dry.  Neurological:     Mental Status: She is alert and oriented to person, place, and time.  Psychiatric:        Mood and Affect: Mood normal.        Behavior: Behavior normal.        Thought Content: Thought content normal.        Judgment: Judgment normal.          Results for orders placed or performed in visit on 07/20/24  Basic metabolic panel with  GFR  Result Value Ref Range   Glucose 69 (L) 70 - 99 mg/dL   BUN 13 6 - 24 mg/dL   Creatinine, Ser 9.28 0.57 - 1.00 mg/dL   eGFR 98 >40 fO/fpw/8.26   BUN/Creatinine Ratio 18 9 - 23   Sodium 138 134 - 144 mmol/L   Potassium 4.2 3.5 - 5.2 mmol/L   Chloride 93 (L) 96 - 106 mmol/L   CO2 28 20 - 29 mmol/L   Calcium  9.7 8.7 - 10.2 mg/dL      The 89-bzjm ASCVD risk score (Arnett DK, et al., 2019) is: 13.5%    Assessment & Plan:   Congestive heart failure, unspecified HF chronicity, unspecified heart failure type [facility]) Assessment & Plan: She has no peripheral edema and her lung fields are clear today.  Will check a basic metabolic panel for renal function.  She is currently taking to acetamide 40 mg daily and potassium chloride  10 mEq twice daily.  Reports no PND or orthopnea.  Orders: -     Basic metabolic panel with GFR  Nausea -     Ondansetron  HCl; Take 1 tablet (4 mg total) by mouth every 8 (eight) hours as needed for nausea or vomiting.  Dispense: 30 tablet; Refill: 3  Pulmonary embolism and infarction [facility]) Assessment & Plan: She is off Eliquis  now by hematology's recommendation.   Chronic systolic congestive heart failure (HCC) Assessment & Plan: She has no peripheral edema and her lung fields are clear today.  Will check a basic metabolic panel for renal function.  She is currently taking to acetamide 40 mg daily and potassium chloride  10 mEq twice daily.  Reports no PND or orthopnea.   Other polyneuropathy Assessment & Plan: Occurs in both her legs and feet at night.  She is to do a trial of Aspercreme.  Discussed nutritional support for nerves and recommended alpha lipoic acid, MSN and coq.10 enzyme.           No follow-ups on file.    Abigail [name] K Abigail Verley, MD

## 2024-07-24 NOTE — Assessment & Plan Note (Signed)
 Occurs in both her legs and feet at night.  She is to do a trial of Aspercreme.  Discussed nutritional support for nerves and recommended alpha lipoic acid, MSN and coq.10 enzyme.

## 2024-07-24 NOTE — Assessment & Plan Note (Signed)
 She is off Eliquis  now by hematology's recommendation.

## 2024-07-28 ENCOUNTER — Other Ambulatory Visit: Payer: Self-pay | Admitting: Psychiatry

## 2024-07-28 DIAGNOSIS — F411 Generalized anxiety disorder: Secondary | ICD-10-CM

## 2024-07-28 DIAGNOSIS — F322 Major depressive disorder, single episode, severe without psychotic features: Secondary | ICD-10-CM

## 2024-07-28 DIAGNOSIS — G4701 Insomnia due to medical condition: Secondary | ICD-10-CM

## 2024-07-29 ENCOUNTER — Telehealth: Payer: Self-pay

## 2024-07-29 NOTE — Telephone Encounter (Signed)
 Copied from CRM #[id number]. Topic: Clinical - Medical Advice >> Jul 29, 2024  3:20 PM Montie POUR wrote: Reason for CRM:  Abigail [name] is calling because Abigail [name] has gained weight in 1 day from 147 to 150. Legs are not swollen and no other symptoms. She wants Dr. [name] to know in case the doctor wants to increase her torsemide  (DEMADEX ) 20 MG tablet Please call [phone] to discuss with [name]. Thanks

## 2024-07-30 ENCOUNTER — Ambulatory Visit: Admitting: Podiatry

## 2024-07-30 ENCOUNTER — Encounter: Admitting: Podiatry

## 2024-07-30 VITALS — Ht 65.0 in | Wt 155.0 lb

## 2024-07-30 DIAGNOSIS — Z5321 Procedure and treatment not carried out due to patient leaving prior to being seen by health care provider: Secondary | ICD-10-CM

## 2024-07-30 NOTE — Telephone Encounter (Signed)
 Spoken to Dr [name] and she stated since there are no symptoms then we will continue torsemide  20 mg as is, no change. To give a call if patient starts to have symptoms.

## 2024-07-30 NOTE — Telephone Encounter (Signed)
 Left Voicemail for Abigail [name] to call back

## 2024-07-30 NOTE — Telephone Encounter (Signed)
 Spoken to Abigail [name], she stated that she wanted Dr [name] to know that patient is now at a 3 pounds weight gain. Patient stated that she is a lot of junk food. There is no SOB, no pain, lungs sound clear, no swelling.   Please advise.

## 2024-07-31 ENCOUNTER — Telehealth: Payer: Self-pay

## 2024-07-31 ENCOUNTER — Ambulatory Visit: Payer: Self-pay

## 2024-07-31 NOTE — Telephone Encounter (Signed)
 Mother is going to discuss with patient and call back. UC recommended d/t not having podiatry appt until Wednesday.  If she calls back, needs assistance locating closest UC with hours.  FYI Only or Action Required?: FYI only for provider: UC advised.  Patient was last seen in primary care on 07/20/2024 by [name], Susan K, MD.  Called Nurse Triage reporting Toe Pain.  Symptoms began several days ago.  Interventions attempted: Rest, hydration, or home remedies.  Symptoms are: gradually worsening.  Triage Disposition: See Physician Within 24 Hours  Patient/caregiver understands and will follow disposition?: Unsure  Reason for Disposition  [1] Swollen toe AND [2] no fever  (Exceptions: Just a localized bump from bunion, corns, insect bite, sting.)  Answer Assessment - Initial Assessment Questions Patients home health nurse Nestora states that pt was advised to be seen by a provider d/t toe edema. Her podiatry appt was cancelled and rescheduled for Wednesday. Makayla advised it would be best to contact patient's mother as she is usually easier to reach by phone.  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 Same toe is red and edematous. Was supposed to have a podiatry appt yesterday but it was cancelled.    1. ONSET: When did the pain start?      Noted on 07/29/24 by home health nurse 2. LOCATION: Where is the pain located?   (e.g., around nail, entire toe, at foot joint)      Right 2nd toe 3. PAIN: How bad is the pain?    (Scale 1-10; or mild, moderate, severe)     Not with pt to specify 4. APPEARANCE: What does the toe look like? (e.g., redness, swelling, bruising, pallor)     Red and swollen 5. CAUSE: What do you think is causing the toe pain?     Unknown  Protocols used: Toe Pain-A-AH  Copied from CRM [id number]. Topic: Clinical - Red Word Triage >> Jul 31, 2024  2:46 PM Terri G wrote: Red Word that prompted transfer to Nurse Triage: speaking with nurse  [facility]. Patient second right toe is red and swollen. Callback number [phone]

## 2024-07-31 NOTE — Telephone Encounter (Signed)
 Mother called back to get Urgent care hours. Gave information for nearest [facility] urgent care. Verbalized understanding.

## 2024-08-01 ENCOUNTER — Ambulatory Visit
Admission: EM | Admit: 2024-08-01 | Discharge: 2024-08-01 | Disposition: A | Discharge status: Home / Self Care | Attending: Emergency Medicine | Admitting: Emergency Medicine

## 2024-08-01 ENCOUNTER — Ambulatory Visit

## 2024-08-01 ENCOUNTER — Encounter: Payer: Self-pay | Admitting: Emergency Medicine

## 2024-08-01 DIAGNOSIS — M25571 Pain in right ankle and joints of right foot: Secondary | ICD-10-CM

## 2024-08-01 DIAGNOSIS — L03115 Cellulitis of right lower limb: Secondary | ICD-10-CM

## 2024-08-01 MED ORDER — DOXYCYCLINE HYCLATE 100 MG PO CAPS: 100.0000 mg | ORAL_CAPSULE | Freq: Two times a day (BID) | ORAL | 0 refills | Status: DC

## 2024-08-01 NOTE — ED Provider Notes (Signed)
 [facility]    CSN: [id number] Arrival date & time: 08/01/24  1549      History   Chief Complaint Chief Complaint  Patient presents with   Recurrent Skin Infections    HPI Abigail [name] [facility] is a 58 y.o. female.   HPI  58 year old female with past medical history significant for GERD, anxiety, degenerative disc disease, chronic opioid use, idiopathic pancreatitis, giant cell arteritis, and peripheral neuropathy presents for evaluation of redness and swelling to her right second toe and the top of her right foot as well as a ruptured blister on the medial edge of her right second toe.  She denies any fever.  Past Medical History:  Diagnosis Date   Anxiety    Arthritis    Back pain    Basal cell carcinoma 10/04/2021   R axilla - needs excised 11/28/21   Basal cell carcinoma 10/04/2021   L antecubital excised 11/14/21   Diarrhea 11/12/2016   Fibromyalgia    Generalized abdominal pain 11/12/2016   H. pylori infection    Hyperlipidemia    IBS (irritable bowel syndrome)    Infectious colitis 04/29/2016   Migraines    Moderate dehydration 04/29/2016   Muscle pain    Opioid overdose (HCC) 12/07/2022   Reflux    Seizures (HCC)    Unexplained weight loss 11/12/2016    Patient Active Problem List   Diagnosis Date Noted   Peripheral neuropathy 07/24/2024   MDD (major depressive disorder), single episode, severe (HCC) 06/29/2024   GAD (generalized anxiety disorder) 06/29/2024   Insomnia due to medical condition 06/29/2024   Iron  deficiency anemia 06/22/2024   Macrocytic anemia 06/08/2024   Normocytic anemia 06/08/2024   Low folate 05/13/2024   History of pulmonary embolus (PE) 05/03/2024   Seizure disorder (HCC) 05/03/2024   GERD without esophagitis 05/03/2024   Chronic obstructive pulmonary disease (COPD) (HCC) 05/03/2024   Low blood pressure 04/28/2024   Malnutrition of moderate degree 04/06/2024   Hypoalbuminemia 04/04/2024   Hypocalcemia  04/04/2024   Congestive heart failure (CHF) (HCC) 04/04/2024   Hypovolemia 03/19/2024   Venous stasis ulcers (HCC) 03/11/2024   Fatigue 03/04/2024   Frequent falls 03/04/2024   Pulmonary embolism and infarction (HCC) 01/02/2024   COPD (chronic obstructive pulmonary disease) (HCC) 12/29/2023   Hypothyroidism 11/02/2023   Hyponatremia 09/27/2023   Chronic pancreatitis (HCC) 07/30/2023   Chronic respiratory failure with hypoxia (HCC) 07/29/2023   Pancreatic pseudocyst 07/13/2023   History of seizure 06/28/2023   Epilepsy, (HCC) 06/28/2023   Osteopenia 05/29/2023   Giant cell arteritis (HCC) 12/19/2022   DNR (do not resuscitate) 12/19/2022   Hypokalemia 12/08/2022   Elevated random blood glucose level 08/12/2022   Idiopathic acute pancreatitis without infection or necrosis 08/08/2022   Tobacco use disorder 08/08/2022   Asthma 04/26/2020   Drug-induced constipation 04/26/2020   GERD (gastroesophageal reflux disease) 11/12/2016   Chronic prescription opiate use - thru [facility] in [place], [place] 04/29/2016   Long term current use of immunosuppressive drug 04/29/2016   Depression with anxiety 02/16/2013   Anxiety 02/13/2013   DDD (degenerative disc disease), cervical 02/13/2013    Past Surgical History:  Procedure Laterality Date   ABDOMINAL HYSTERECTOMY     APPENDECTOMY  2009   C5 FUSION     C6 FUSION     C7 FUSION     COLONOSCOPY  02/2006   COLONOSCOPY WITH PROPOFOL  N/A 12/25/2016   Procedure: COLONOSCOPY WITH PROPOFOL ;  Surgeon: Dessa Reyes ORN, MD;  Location:  [facility] ENDOSCOPY;  Service: Endoscopy;  Laterality: N/A;   ESOPHAGOGASTRODUODENOSCOPY (EGD) WITH PROPOFOL  N/A 12/25/2016   Procedure: ESOPHAGOGASTRODUODENOSCOPY (EGD) WITH PROPOFOL ;  Surgeon: Dessa Reyes ORN, MD;  Location: [facility] ENDOSCOPY;  Service: Endoscopy;  Laterality: N/A;   FOOT SURGERY     HIP ARTHROPLASTY     L4 FUSION     L5 FUSION     S1 FUSION      OB History   No obstetric history on file.       Home Medications    Prior to Admission medications  Medication Sig Start Date End Date Taking? Authorizing Provider  doxycycline  (VIBRAMYCIN ) 100 MG capsule Take 1 capsule (100 mg total) by mouth 2 (two) times daily for 7 days. 08/01/24 08/08/24 Yes Bernardino Ditch, NP  acetaminophen  (TYLENOL ) 500 MG tablet Take 1,000 mg by mouth every 6 (six) hours as needed for moderate pain (pain score 4-6).    [provider]  albuterol  (VENTOLIN  HFA) 108 (90 Base) MCG/ACT inhaler Inhale 2 puffs into the lungs. Take two (2) puffs by mouth every 4 to 6 hours as needed for wheezing or shortness of breath    [provider]  AMBULATORY NON FORMULARY MEDICATION Knee-high, medium compression, graduated compression stockings. Apply to lower extremities. [email or web address], Zippered Compression Stockings, medium circ, long length 03/04/24   [name], Susan K, MD  bisacodyl  (DULCOLAX) 5 MG EC tablet Take 1 tablet (5 mg total) by mouth daily as needed for moderate constipation. 10/04/23   Maree Hue, MD  carbamazepine  (TEGRETOL  XR) 100 MG 12 hr tablet Take 1 tablet (100 mg total) by mouth 2 (two) times daily. 01/02/24 05/20/25  Wouk, Devaughn Sayres, MD  Cholecalciferol  (VITAMIN D -3 PO) Take 1 capsule by mouth every morning.    [provider]  collagenase  (SANTYL ) 250 UNIT/GM ointment Apply 1 Application topically daily. Patient not taking: Reported on 07/30/2024 03/26/24   [name], Susan K, MD  DULoxetine  (CYMBALTA ) 60 MG capsule TAKE 1 CAPSULE BY MOUTH EVERY DAY 07/30/24   [name], Susan K, MD  feeding supplement (ENSURE PLUS HIGH PROTEIN) LIQD Take 237 mLs by mouth 3 (three) times daily between meals. Patient taking differently: Take 237 mLs by mouth 3 (three) times daily between meals. boost 04/06/24   Von Bellis, MD  folic acid  (FOLVITE ) 1 MG tablet Take 1 tablet (1 mg total) by mouth daily. 07/06/24 04/02/25  [name], Susan K, MD  gabapentin  (NEURONTIN ) 300 MG capsule Take 1 capsule (300 mg  total) by mouth 4 (four) times daily. 10/04/23 05/20/25  Maree Hue, MD  lamoTRIgine  (LAMICTAL ) 25 MG tablet Take 2 tablets (50 mg total) by mouth 2 (two) times daily. 07/06/24   [name], Susan K, MD  levETIRAcetam  (KEPPRA ) 750 MG tablet Take 2 tablets (1,500 mg total) by mouth 2 (two) times daily. 01/02/24   Wouk, Devaughn Sayres, MD  Melatonin 10 MG TABS Take 10 mg by mouth at bedtime.    [provider]  midodrine  (PROAMATINE ) 5 MG tablet Take 5 mg by mouth 3 (three) times daily with meals. Patient taking differently: Take 5 mg by mouth 3 (three) times daily as needed.    [provider]  mirtazapine  (REMERON ) 15 MG tablet Take 1 tablet (15 mg total) by mouth at bedtime. No refills without a follow-up appointment. 07/28/24 08/27/24  Eappen, Saramma, MD  NON FORMULARY Apply 1 Application topically 3 (three) times daily as needed. Rock Salt Cream (Similar to [employer])    [provider]  ondansetron  (ZOFRAN ) 4  MG tablet Take 1 tablet (4 mg total) by mouth every 8 (eight) hours as needed for nausea or vomiting. 07/20/24   [name], Susan K, MD  Oxycodone  HCl 10 MG TABS Take 10 mg by mouth every 4 (four) hours as needed. 06/18/24   [provider]  Pancrelipase , Lip-Prot-Amyl, 24000-76000 units CPEP Take 1 capsule (24,000 Units total) by mouth 3 (three) times daily with meals. 07/06/24   [name], Susan K, MD  pantoprazole  (PROTONIX ) 40 MG tablet Take 1 tablet (40 mg total) by mouth daily. 03/11/24   [name], Susan K, MD  potassium chloride  (KLOR-CON  M) 10 MEQ tablet Take 1 tablet (10 mEq total) by mouth 2 (two) times daily. 07/06/24   [name], Susan K, MD  torsemide  (DEMADEX ) 20 MG tablet Take 1 tablet (20 mg total) by mouth 2 (two) times daily. 07/06/24   [name], Devere POUR, MD    Family History Family History  Problem Relation Age of Onset   Diabetes Father    Hypertension Father    Mental illness Neg Hx     Social History Social History[1]   Allergies   Nsaids,  Tapentadol, Cephalexin , Codeine, Darvocet [propoxyphene n-acetaminophen ], Latex, Silicone, Sulfa antibiotics, Tape, and Meloxicam   Review of Systems Review of Systems  Constitutional:  Negative for fever.  Musculoskeletal:  Positive for arthralgias and joint swelling.  Skin:  Positive for color change and wound.     Physical Exam Triage Vital Signs ED Triage Vitals [08/01/24 1603]  Encounter Vitals Group     BP      Girls Systolic BP Percentile      Girls Diastolic BP Percentile      Boys Systolic BP Percentile      Boys Diastolic BP Percentile      Pulse      Resp      Temp      Temp src      SpO2      Weight      Height      Head Circumference      Peak Flow      Pain Score 7     Pain Loc      Pain Education      Exclude from Growth Chart    No data found.  Updated Vital Signs BP 126/70 (BP Location: Right Arm)   Pulse 96   Temp 99.4 F (37.4 C) (Oral)   SpO2 94% Comment: pt states this is normal for her  Visual Acuity Right Eye Distance:   Left Eye Distance:   Bilateral Distance:    Right Eye Near:   Left Eye Near:    Bilateral Near:     Physical Exam Vitals and nursing note reviewed.  Constitutional:      Appearance: Normal appearance. She is not ill-appearing.  HENT:     Head: Normocephalic and atraumatic.  Musculoskeletal:        General: Swelling and tenderness present.  Skin:    General: Skin is warm and dry.     Capillary Refill: Capillary refill takes less than 2 seconds.     Findings: Erythema present.  Neurological:     General: No focal deficit present.     Mental Status: She is alert and oriented to person, place, and time.      UC Treatments / Results  Labs (all labs ordered are listed, but only abnormal results are displayed) Labs Reviewed - No data to display  EKG   Radiology No results  found.  Procedures Procedures (including critical care time)  Medications Ordered in UC Medications - No data to  display  Initial Impression / Assessment and Plan / UC Course  I have reviewed the triage vital signs and the nursing notes.  Pertinent labs & imaging results that were available during my care of the patient were reviewed by me and considered in my medical decision making (see chart for details).   Patient presents at the advice of her home health nurse to be evaluated for possible cellulitis of her right second toe.    As you can see in the images above, the right second toe and the distal medial aspect of the right foot are edematous, erythematous, and they are also hot to the touch.  Is further visualized, there is a deep ulcer formation on the medial aspect of the proximal phalanx of the right second toe.  The patient reports that when she woke up this morning she noticed that there is a blister that had formed and that it had ruptured.  She has not noticed any drainage from the wound.  DP and PT pulses are two fourths.  I will obtain a radiograph of the right foot to evaluate for any possibility of osteomyelitis.  If the x-ray is negative for osteo I will discharge her home on oral antibiotics as she has an appointment scheduled for Wednesday of this upcoming week with podiatry.  Right foot x-ray independent reviewed and evaluated by me.  Impression: No evidence of osteomyelitis.  Soft tissue swelling is present.  Radiology read is pending.  I will discharge patient home with a diagnosis of cellulitis of right foot and started on doxycycline  100 mg twice daily for 7 days.  She should keep her follow-up appointment with podiatry for the 17th as scheduled.   Final Clinical Impressions(s) / UC Diagnoses   Final diagnoses:  Pain in joint involving right ankle and foot  Cellulitis of right lower extremity     Discharge Instructions      Your x-rays did not show any evidence of osteomyelitis but you do have significant soft tissue swelling.  Take the doxycycline  twice daily with food  for 7 days to treat cellulitis in your toe.  Keep your right foot elevated is much as possible to help decrease swelling and aid in pain relief.  Keep your follow-up appointment with podiatry on the 17th as previously scheduled.  If you develop any increased redness, swelling, red streaks going up your leg, pus drainage from the toe, or fever I would recommend going to the ER for evaluation.     ED Prescriptions     Medication Sig Dispense Auth. Provider   doxycycline  (VIBRAMYCIN ) 100 MG capsule Take 1 capsule (100 mg total) by mouth 2 (two) times daily for 7 days. 14 capsule Bernardino Ditch, NP      PDMP not reviewed this encounter.     [1]  Social History Tobacco Use   Smoking status: Every Day    Current packs/day: 1.00    Types: Cigarettes    Passive exposure: Past   Smokeless tobacco: Never   Tobacco comments:    ELECTRONIC VAPOR  Vaping Use   Vaping status: Every Day   Substances: Nicotine   Substance Use Topics   Alcohol use: Not Currently    Comment: OCCASIONALLY   Drug use: Not Currently    Types: Marijuana     Bernardino Ditch, NP 08/01/24 1626

## 2024-08-01 NOTE — Discharge Instructions (Addendum)
 Your x-rays did not show any evidence of osteomyelitis but you do have significant soft tissue swelling.  Take the doxycycline  twice daily with food for 7 days to treat cellulitis in your toe.  Keep your right foot elevated is much as possible to help decrease swelling and aid in pain relief.  Keep your follow-up appointment with podiatry on the 17th as previously scheduled.  If you develop any increased redness, swelling, red streaks going up your leg, pus drainage from the toe, or fever I would recommend going to the ER for evaluation.

## 2024-08-01 NOTE — ED Triage Notes (Signed)
 Pt c/o 2nd left toe infection. Toe and top of foot is painful and swollen. She states she has a callus she was trying to scrape off. She states she has an appt with podiatrist on Monday but that home health nurse suggested she been seen sooner. Pt has compression sock on.

## 2024-08-02 NOTE — Progress Notes (Signed)
 Pt left before rooming and eval

## 2024-08-03 ENCOUNTER — Ambulatory Visit: Admitting: Podiatry

## 2024-08-04 ENCOUNTER — Ambulatory Visit: Payer: Self-pay

## 2024-08-04 NOTE — Telephone Encounter (Signed)
 FYI Only or Action Required?: Action required by provider: update on patient condition and refused ED 08/03/24.  Patient was last seen in primary care on 07/20/2024 by [name], Susan K, MD.  Called Nurse Triage reporting Seizures.  Symptoms began yesterday.  Interventions attempted: Nothing.  Symptoms are: stable.  Triage Disposition: Home Care  Patient/caregiver understands and will follow disposition?: Yes   Copied from CRM #[id number]. Topic: Clinical - Red Word Triage >> Aug 04, 2024  4:40 PM Joesph NOVAK wrote: Red Word that prompted transfer to Nurse Triage:  Patient had a seizure last night. Refused to go to the hospital. Lorenza with [facility] is calling.   Reason for Disposition  [1] Seizure lasting < 5 minutes AND [2] history of prior seizure(s) AND [3] taking antiseizure medicine (anticonvulsant)  Answer Assessment - Initial Assessment Questions [facility] RN with [facility] called to report that pt's mother called and reported seizure to her. Originally scheduled to see pt tomorrow, 12/17 but due to appt, [facility] scheduled 12/19. States pt's mother contacted her to report 40 minute seizure 12/15. States that pt felt seizure coming on so she was sitting at time of episode. Pt does take seizure medication daily; unknown if any missed doses. States that pt did not fall or hit her head. EMS was called but pt refused ED disposition. HH RN wanted to make PCP aware of episode d/t length and ED refusal.    1. ONSET: When did the seizure occur?     Last night, 12/15 for 40 minutes at 7pm   2. DURATION: How long did the seizure last (or how long has it been happening)? (e.g., seconds, minutes)  Note: Most seizures last less than 5 minutes.     40 minutes   3. DESCRIPTION: Describe what happened during the seizure. Did the body become stiff? Was there any jerking?  Did they lose consciousness during the seizure?     No LOC; arms jerking per pt's mom to [facility] RN   4. CIRCUMSTANCE:  What was the person doing when the seizure began?      Was in bathroom, tasted metallic taste in mouth so she knew seizure was coming on so she went to living room to sit down and seizure started   5. MENTAL STATUS AFTER SEIZURE: Does the person seem more groggy or sleepy? Does the person know who they are, who you are, and where they are now?      None   6. PRIOR SEIZURES: Has the person had a seizure (convulsion) before? (e.g., epilepsy, other cause)  If Yes, ask: When was the last time? and What happened last time?      Yes; hx of seizures   7. EPILEPSY: Does the person have epilepsy? Note: Check for medical ID bracelet.     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 MEDICINES: Does the person take anticonvulsant medications? (e.g., Yes, No; missed doses, any recent changes)     Yes; unknown of missed doses, mom manages medications      Pt takes lamoTRIgine  (LAMICTAL ) 25 MG tablet and levETIRAcetam  (KEPPRA ) 750 MG tablet BID daily   9. INJURY: Was the person hurt or injured during the seizure? (e.g., hit their head, bit their tongue)     No; pt was sitting at time of episode   10. OTHER SYMPTOMS: Are there any other symptoms? (e.g., fever, headache)       None  Protocols used: [employer]

## 2024-08-05 ENCOUNTER — Ambulatory Visit: Admitting: Podiatry

## 2024-08-05 ENCOUNTER — Ambulatory Visit

## 2024-08-05 ENCOUNTER — Observation Stay
Admission: EM | Admit: 2024-08-05 | Discharge: 2024-08-06 | Disposition: A | Discharge status: Home / Self Care | Attending: Internal Medicine | Admitting: Internal Medicine

## 2024-08-05 ENCOUNTER — Other Ambulatory Visit: Payer: Self-pay

## 2024-08-05 ENCOUNTER — Encounter: Payer: Self-pay | Admitting: Emergency Medicine

## 2024-08-05 ENCOUNTER — Telehealth: Payer: Self-pay | Admitting: Pharmacist

## 2024-08-05 ENCOUNTER — Other Ambulatory Visit: Payer: Self-pay | Admitting: Pharmacist

## 2024-08-05 ENCOUNTER — Emergency Department

## 2024-08-05 DIAGNOSIS — E11621 Type 2 diabetes mellitus with foot ulcer: Secondary | ICD-10-CM | POA: Insufficient documentation

## 2024-08-05 DIAGNOSIS — I509 Heart failure, unspecified: Secondary | ICD-10-CM | POA: Diagnosis not present

## 2024-08-05 DIAGNOSIS — E039 Hypothyroidism, unspecified: Secondary | ICD-10-CM | POA: Insufficient documentation

## 2024-08-05 DIAGNOSIS — G9340 Encephalopathy, unspecified: Secondary | ICD-10-CM | POA: Insufficient documentation

## 2024-08-05 DIAGNOSIS — Z86711 Personal history of pulmonary embolism: Secondary | ICD-10-CM | POA: Insufficient documentation

## 2024-08-05 DIAGNOSIS — I83015 Varicose veins of right lower extremity with ulcer other part of foot: Secondary | ICD-10-CM | POA: Insufficient documentation

## 2024-08-05 DIAGNOSIS — F418 Other specified anxiety disorders: Secondary | ICD-10-CM | POA: Diagnosis present

## 2024-08-05 DIAGNOSIS — M86171 Other acute osteomyelitis, right ankle and foot: Secondary | ICD-10-CM

## 2024-08-05 DIAGNOSIS — I110 Hypertensive heart disease with heart failure: Secondary | ICD-10-CM | POA: Insufficient documentation

## 2024-08-05 DIAGNOSIS — L97319 Non-pressure chronic ulcer of right ankle with unspecified severity: Secondary | ICD-10-CM | POA: Diagnosis not present

## 2024-08-05 DIAGNOSIS — K863 Pseudocyst of pancreas: Secondary | ICD-10-CM | POA: Diagnosis not present

## 2024-08-05 DIAGNOSIS — F419 Anxiety disorder, unspecified: Secondary | ICD-10-CM | POA: Insufficient documentation

## 2024-08-05 DIAGNOSIS — F411 Generalized anxiety disorder: Secondary | ICD-10-CM

## 2024-08-05 DIAGNOSIS — E8809 Other disorders of plasma-protein metabolism, not elsewhere classified: Secondary | ICD-10-CM | POA: Diagnosis not present

## 2024-08-05 DIAGNOSIS — Z79891 Long term (current) use of opiate analgesic: Secondary | ICD-10-CM | POA: Diagnosis not present

## 2024-08-05 DIAGNOSIS — F321 Major depressive disorder, single episode, moderate: Secondary | ICD-10-CM

## 2024-08-05 DIAGNOSIS — J9611 Chronic respiratory failure with hypoxia: Secondary | ICD-10-CM | POA: Insufficient documentation

## 2024-08-05 DIAGNOSIS — M869 Osteomyelitis, unspecified: Secondary | ICD-10-CM

## 2024-08-05 DIAGNOSIS — R110 Nausea: Secondary | ICD-10-CM

## 2024-08-05 DIAGNOSIS — J4489 Other specified chronic obstructive pulmonary disease: Secondary | ICD-10-CM | POA: Insufficient documentation

## 2024-08-05 DIAGNOSIS — M778 Other enthesopathies, not elsewhere classified: Secondary | ICD-10-CM | POA: Diagnosis not present

## 2024-08-05 DIAGNOSIS — L03031 Cellulitis of right toe: Secondary | ICD-10-CM | POA: Diagnosis present

## 2024-08-05 DIAGNOSIS — L02611 Cutaneous abscess of right foot: Secondary | ICD-10-CM

## 2024-08-05 DIAGNOSIS — G40909 Epilepsy, unspecified, not intractable, without status epilepticus: Secondary | ICD-10-CM | POA: Insufficient documentation

## 2024-08-05 DIAGNOSIS — L03115 Cellulitis of right lower limb: Principal | ICD-10-CM | POA: Diagnosis present

## 2024-08-05 DIAGNOSIS — Z91040 Latex allergy status: Secondary | ICD-10-CM | POA: Diagnosis not present

## 2024-08-05 DIAGNOSIS — F17210 Nicotine dependence, cigarettes, uncomplicated: Secondary | ICD-10-CM | POA: Insufficient documentation

## 2024-08-05 DIAGNOSIS — L97519 Non-pressure chronic ulcer of other part of right foot with unspecified severity: Secondary | ICD-10-CM | POA: Diagnosis not present

## 2024-08-05 DIAGNOSIS — K8681 Exocrine pancreatic insufficiency: Secondary | ICD-10-CM

## 2024-08-05 DIAGNOSIS — I9589 Other hypotension: Secondary | ICD-10-CM | POA: Insufficient documentation

## 2024-08-05 DIAGNOSIS — F32A Depression, unspecified: Secondary | ICD-10-CM | POA: Insufficient documentation

## 2024-08-05 DIAGNOSIS — J45909 Unspecified asthma, uncomplicated: Secondary | ICD-10-CM | POA: Diagnosis present

## 2024-08-05 DIAGNOSIS — Z85828 Personal history of other malignant neoplasm of skin: Secondary | ICD-10-CM | POA: Insufficient documentation

## 2024-08-05 DIAGNOSIS — I83009 Varicose veins of unspecified lower extremity with ulcer of unspecified site: Secondary | ICD-10-CM | POA: Diagnosis present

## 2024-08-05 DIAGNOSIS — F322 Major depressive disorder, single episode, severe without psychotic features: Secondary | ICD-10-CM

## 2024-08-05 DIAGNOSIS — G4701 Insomnia due to medical condition: Secondary | ICD-10-CM

## 2024-08-05 LAB — CBC WITH DIFFERENTIAL/PLATELET
Abs Immature Granulocytes: 0.01 K/uL (ref 0.00–0.07)
Basophils Absolute: 0 K/uL (ref 0.0–0.1)
Basophils Relative: 1 %
Eosinophils Absolute: 0.4 K/uL (ref 0.0–0.5)
Eosinophils Relative: 5 %
HCT: 41.2 % (ref 36.0–46.0)
Hemoglobin: 12.9 g/dL (ref 12.0–15.0)
Immature Granulocytes: 0 %
Lymphocytes Relative: 30 %
Lymphs Abs: 2.4 K/uL (ref 0.7–4.0)
MCH: 27.6 pg (ref 26.0–34.0)
MCHC: 31.3 g/dL (ref 30.0–36.0)
MCV: 88.2 fL (ref 80.0–100.0)
Monocytes Absolute: 0.6 K/uL (ref 0.1–1.0)
Monocytes Relative: 8 %
Neutro Abs: 4.5 K/uL (ref 1.7–7.7)
Neutrophils Relative %: 56 %
Platelets: 258 K/uL (ref 150–400)
RBC: 4.67 MIL/uL (ref 3.87–5.11)
RDW: 19.2 % — ABNORMAL HIGH (ref 11.5–15.5)
WBC: 8 K/uL (ref 4.0–10.5)
nRBC: 0 % (ref 0.0–0.2)

## 2024-08-05 LAB — COMPREHENSIVE METABOLIC PANEL WITH GFR
ALT: 15 U/L (ref 0–44)
AST: 26 U/L (ref 15–41)
Albumin: 4.3 g/dL (ref 3.5–5.0)
Alkaline Phosphatase: 156 U/L — ABNORMAL HIGH (ref 38–126)
Anion gap: 11 (ref 5–15)
BUN: 19 mg/dL (ref 6–20)
CO2: 33 mmol/L — ABNORMAL HIGH (ref 22–32)
Calcium: 9.6 mg/dL (ref 8.9–10.3)
Chloride: 94 mmol/L — ABNORMAL LOW (ref 98–111)
Creatinine, Ser: 0.76 mg/dL (ref 0.44–1.00)
GFR, Estimated: >60 mL/min (ref >60)
Glucose, Bld: 105 mg/dL — ABNORMAL HIGH (ref 70–99)
Potassium: 3.7 mmol/L (ref 3.5–5.1)
Sodium: 138 mmol/L (ref 135–145)
Total Bilirubin: 0.2 mg/dL (ref 0.0–1.2)
Total Protein: 8 g/dL (ref 6.5–8.1)

## 2024-08-05 LAB — LACTIC ACID, PLASMA: Lactic Acid, Venous: 0.8 mmol/L (ref 0.5–1.9)

## 2024-08-05 MED ORDER — VANCOMYCIN HCL IN DEXTROSE 1-5 GM/200ML-% IV SOLN
1000.0000 mg | Freq: Once | INTRAVENOUS | Status: AC
  Administered 2024-08-06: 01:00:00 1000 mg via INTRAVENOUS
  Filled 2024-08-05: qty 200

## 2024-08-05 MED ORDER — LEVOFLOXACIN IN D5W 750 MG/150ML IV SOLN
750.0000 mg | Freq: Once | INTRAVENOUS | Status: AC
  Administered 2024-08-05: 23:00:00 750 mg via INTRAVENOUS
  Filled 2024-08-05: qty 150

## 2024-08-05 MED ORDER — MIDODRINE HCL 5 MG PO TABS
10.0000 mg | ORAL_TABLET | Freq: Once | ORAL | Status: AC
  Administered 2024-08-06: 10 mg via ORAL
  Filled 2024-08-05: qty 2

## 2024-08-05 MED ORDER — LACTATED RINGERS IV BOLUS
1000.0000 mL | Freq: Once | INTRAVENOUS | Status: AC
  Administered 2024-08-06: 03:00:00 1000 mL via INTRAVENOUS

## 2024-08-05 MED ORDER — ONDANSETRON HCL 4 MG/2ML IJ SOLN
4.0000 mg | Freq: Once | INTRAMUSCULAR | Status: AC
  Administered 2024-08-06: 4 mg via INTRAVENOUS
  Filled 2024-08-05: qty 2

## 2024-08-05 MED ORDER — LACTATED RINGERS IV BOLUS
1000.0000 mL | Freq: Once | INTRAVENOUS | Status: AC
  Administered 2024-08-06: 1000 mL via INTRAVENOUS

## 2024-08-05 MED ORDER — FENTANYL CITRATE (PF) 50 MCG/ML IJ SOSY
50.0000 ug | PREFILLED_SYRINGE | Freq: Once | INTRAMUSCULAR | Status: AC
  Administered 2024-08-06: 50 ug via INTRAVENOUS
  Filled 2024-08-05: qty 1

## 2024-08-05 NOTE — Progress Notes (Signed)
° °  Outreach Note  08/05/2024 Name: Abigail [name] [facility] MRN: [id number] DOB: 06/19/1966  Referred by: [name], Susan K, MD  Was unable to reach patient via telephone today and have left HIPAA compliant voicemail asking patient to return my call.    Sharyle Sia, PharmD, [facility] [phone]

## 2024-08-05 NOTE — ED Triage Notes (Signed)
 Pt sent by podiatrist for R/O osteomyelitis to 2nd R toe. Pt states infection has been developing x 1 wk - began as callous, turned to blister and is now infected. 83/60 in triage

## 2024-08-05 NOTE — ED Provider Notes (Signed)
 [facility] Provider Note    Event Date/Time   First MD Initiated Contact with Patient 08/05/24 2105     (approximate)   History   Chief Complaint: Toe Infection and Hypotension   HPI  Abigail [name] [facility] is a 58 y.o. female with a history of migraines, seizures, anxiety who is sent to the ED by podiatry today due to suspected osteomyelitis of the right second toe.  She initially had a callus in the interdigital space, which places pressure on it caused a blister, which then has opened up into a wound.  She reports that podiatry was able to probe the wound to bone with a swab today.  She reports she has poor sensation in the foot.        Past Medical History:  Diagnosis Date   Anxiety    Arthritis    Back pain    Basal cell carcinoma 10/04/2021   R axilla - needs excised 11/28/21   Basal cell carcinoma 10/04/2021   L antecubital excised 11/14/21   Diarrhea 11/12/2016   Fibromyalgia    Generalized abdominal pain 11/12/2016   H. pylori infection    Hyperlipidemia    IBS (irritable bowel syndrome)    Infectious colitis 04/29/2016   Migraines    Moderate dehydration 04/29/2016   Muscle pain    Opioid overdose (HCC) 12/07/2022   Reflux    Seizures (HCC)    Unexplained weight loss 11/12/2016    Current Outpatient Rx   Order #: [id number] Class: Historical Med   Order #: [id number] Class: Historical Med   Order #: [id number] Class: Print   Order #: [id number] Class: Normal   Order #: [id number] Class: Normal   Order #: [id number] Class: Historical Med   Order #: [id number] Class: Normal   Order #: [id number] Class: Normal   Order #: [id number] Class: Normal   Order #: [id number] Class: No Print   Order #: [id number] Class: Normal   Order #: [id number] Class: Normal   Order #: [id number] Class: Normal   Order #: [id number] Class: Normal   Order #: [id number] Class: Historical Med   Order #: [id number] Class: Historical Med   Order #: [id number] Class: Normal    Order #: [id number] Class: Historical Med   Order #: [id number] Class: Normal   Order #: [id number] Class: Historical Med   Order #: [id number] Class: Normal   Order #: [id number] Class: Normal   Order #: [id number] Class: Normal   Order #: [id number] Class: Normal    Past Surgical History:  Procedure Laterality Date   ABDOMINAL HYSTERECTOMY     APPENDECTOMY  2009   C5 FUSION     C6 FUSION     C7 FUSION     COLONOSCOPY  02/2006   COLONOSCOPY WITH PROPOFOL  N/A 12/25/2016   Procedure: COLONOSCOPY WITH PROPOFOL ;  Surgeon: Dessa Reyes ORN, MD;  Location: [facility] ENDOSCOPY;  Service: Endoscopy;  Laterality: N/A;   ESOPHAGOGASTRODUODENOSCOPY (EGD) WITH PROPOFOL  N/A 12/25/2016   Procedure: ESOPHAGOGASTRODUODENOSCOPY (EGD) WITH PROPOFOL ;  Surgeon: Dessa Reyes ORN, MD;  Location: [facility] ENDOSCOPY;  Service: Endoscopy;  Laterality: N/A;   FOOT SURGERY     HIP ARTHROPLASTY     L4 FUSION     L5 FUSION     S1 FUSION      Physical Exam   Triage Vital Signs: ED Triage Vitals  Encounter Vitals Group     BP 08/05/24 2040 (!) 83/60     Girls Systolic BP Percentile --      Girls Diastolic BP Percentile --      Boys Systolic BP Percentile --  Boys Diastolic BP Percentile --      Pulse Rate 08/05/24 2040 84     Resp 08/05/24 2040 17     Temp 08/05/24 2040 98.9 F (37.2 C)     Temp Source 08/05/24 2040 Oral     SpO2 08/05/24 2040 98 %     Weight 08/05/24 2052 155 lb (70.3 kg)     Height --      Head Circumference --      Peak Flow --      Pain Score 08/05/24 2051 9     Pain Loc --      Pain Education --      Exclude from Growth Chart --     Most recent vital signs: Vitals:   08/05/24 2040  BP: (!) 83/60  Pulse: 84  Resp: 17  Temp: 98.9 F (37.2 C)  SpO2: 98%    General: Awake, no distress.  CV:  Good peripheral perfusion.  Regular rate rhythm, normal DP pulses Resp:  Normal effort.  Clear lungs Abd:  No distention.  Soft nontender Other:  Right second toe with ulceration and  maceration of the soft tissue on the medial aspect overlying the PIP joint.  There is edema and erythema.  No purulent drainage or crepitus.   ED Results / Procedures / Treatments   Labs (all labs ordered are listed, but only abnormal results are displayed) Labs Reviewed  CBC WITH DIFFERENTIAL/PLATELET - Abnormal; Notable for the following components:      Result Value   RDW 19.2 (*)    All other components within normal limits  COMPREHENSIVE METABOLIC PANEL WITH GFR - Abnormal; Notable for the following components:   Chloride 94 (*)    CO2 33 (*)    Glucose, Bld 105 (*)    Alkaline Phosphatase 156 (*)    All other components within normal limits  CULTURE, BLOOD (ROUTINE X 2)  CULTURE, BLOOD (ROUTINE X 2)  LACTIC ACID, PLASMA  LACTIC ACID, PLASMA     EKG    RADIOLOGY MRI right foot interpreted by me, shows signal abnormality of the proximal and middle phalanges of the second toe consistent with osteomyelitis and surrounding cellulitis.  Radiology report reviewed   PROCEDURES:  Procedures   MEDICATIONS ORDERED IN ED: Medications  vancomycin  (VANCOCIN ) IVPB 1000 mg/200 mL premix (has no administration in time range)  levofloxacin  (LEVAQUIN ) IVPB 750 mg (750 mg Intravenous New Bag/Given 08/05/24 2319)     IMPRESSION / MDM / ASSESSMENT AND PLAN / ED COURSE  I reviewed the triage vital signs and the nursing notes.  DDx: Right foot cellulitis, osteomyelitis, electrolyte derangement, sepsis  Patient's presentation is most consistent with acute presentation with potential threat to life or bodily function.  Patient presents with swelling, wound of the right second toe, seen by podiatry today and sent to the ED out of concern for osteomyelitis.  She has borderline low blood pressure which she reports is chronic, but usually is about 90/60.  No vomiting or diarrhea, no dizziness.  Other vital signs are normal.  Lactate is normal, at this point I think sepsis is ruled out  despite the presence of infection.  Serum labs are unremarkable.  MRI does show signs of osteomyelitis.  Due to allergy profile, antibiotic coverage with vancomycin  and Levaquin  is ordered.  Will contact hospitalist for further management.        FINAL CLINICAL IMPRESSION(S) / ED DIAGNOSES   Final diagnoses:  Cellulitis of right foot  Osteomyelitis of right foot, unspecified type (HCC)     Rx / DC Orders   ED Discharge Orders     None        Note:  This document was prepared using Dragon voice recognition software and may include unintentional dictation errors.   Viviann Pastor, MD 08/05/24 [phone]

## 2024-08-05 NOTE — ED Notes (Signed)
 Patient to MRI.

## 2024-08-05 NOTE — Progress Notes (Addendum)
 Receive message that patient is calling back. Return call to patient. Requests that we reschedule appointment as her mom/caregiver is needing to leave for an appointment. Rescheduled as requested for 09/02/2024 at 2:00 PM

## 2024-08-05 NOTE — Progress Notes (Signed)
 Abigail [name] presents today with a cellulitic right foot.  She states that she had a blister area and it callused over and she picked at the blister and now she has a full-blown ulcer she went to urgent care last week they made x-ray said the bone was not infected and put her on doxycycline .  She states that she has not been feeling well and that she has severe pain in the foot particularly in the ball of the foot.  Objective: Pulses are palpable.  She has positive hair growth.  She has an ulceration measuring approximately a centimeter in diameter with the PIPJ capsule exposed medial aspect second digit right associated with the juxtaposition of the right hallux.  Cellulitis extends to the level of the mid metatarsal area.  It does look like there may have been some reduction in cellulitis since the picture was taken from urgent care.  Assessment probable osteomyelitis cellulitis possible abscess second digit and forefoot right.  Plan: I consulted Dr. Silva who is here in the office with me today and saw the patient.  We recommended that she go to the emergency department and have the podiatrist on-call take care of her.  Most likely an MRI possible abscess forefoot with either amputation of the toe or incision and drainage or both.  Dispensed a Darco shoe and dressed the toe and a Betadine dressing.  She is to continue her doxycycline .

## 2024-08-05 NOTE — Telephone Encounter (Signed)
 Pt called in due to missed call. Attempted to call and connect pt. No answer. Please follow back up with pt.

## 2024-08-06 ENCOUNTER — Other Ambulatory Visit: Payer: Self-pay

## 2024-08-06 ENCOUNTER — Inpatient Hospital Stay: Admitting: Anesthesiology

## 2024-08-06 ENCOUNTER — Ambulatory Visit (INDEPENDENT_AMBULATORY_CARE_PROVIDER_SITE_OTHER): Admitting: Vascular Surgery

## 2024-08-06 ENCOUNTER — Encounter
Admission: EM | Disposition: A | Discharge status: Home / Self Care | Payer: Self-pay | Source: Home / Self Care | Attending: Internal Medicine

## 2024-08-06 ENCOUNTER — Encounter: Payer: Self-pay | Admitting: Internal Medicine

## 2024-08-06 ENCOUNTER — Inpatient Hospital Stay

## 2024-08-06 DIAGNOSIS — L02611 Cutaneous abscess of right foot: Secondary | ICD-10-CM | POA: Diagnosis not present

## 2024-08-06 DIAGNOSIS — I9589 Other hypotension: Secondary | ICD-10-CM

## 2024-08-06 DIAGNOSIS — M869 Osteomyelitis, unspecified: Secondary | ICD-10-CM

## 2024-08-06 DIAGNOSIS — F418 Other specified anxiety disorders: Secondary | ICD-10-CM | POA: Diagnosis not present

## 2024-08-06 DIAGNOSIS — G40909 Epilepsy, unspecified, not intractable, without status epilepticus: Secondary | ICD-10-CM

## 2024-08-06 DIAGNOSIS — L03031 Cellulitis of right toe: Secondary | ICD-10-CM | POA: Diagnosis not present

## 2024-08-06 DIAGNOSIS — L03115 Cellulitis of right lower limb: Principal | ICD-10-CM | POA: Diagnosis present

## 2024-08-06 HISTORY — PX: AMPUTATION TOE: SHX6595

## 2024-08-06 SURGERY — AMPUTATION, TOE
Anesthesia: Choice | Site: Toe | Laterality: Right

## 2024-08-06 MED ORDER — ACETAMINOPHEN 10 MG/ML IV SOLN: 1000.0000 mg | Freq: Once | INTRAVENOUS | Status: DC | PRN

## 2024-08-06 MED ORDER — FENTANYL CITRATE (PF) 100 MCG/2ML IJ SOLN
INTRAMUSCULAR | Status: AC
  Filled 2024-08-06: qty 2

## 2024-08-06 MED ORDER — DULOXETINE HCL 30 MG PO CPEP
60.0000 mg | ORAL_CAPSULE | Freq: Every day | ORAL | Status: DC
  Administered 2024-08-06: 08:00:00 60 mg via ORAL
  Filled 2024-08-06: qty 2

## 2024-08-06 MED ORDER — ENSURE PLUS HIGH PROTEIN PO LIQD
237.0000 mL | Freq: Three times a day (TID) | ORAL | Status: DC
  Administered 2024-08-06: 15:00:00 237 mL via ORAL

## 2024-08-06 MED ORDER — OXYCODONE HCL 5 MG PO TABS
10.0000 mg | ORAL_TABLET | ORAL | Status: DC | PRN
  Administered 2024-08-06 (×3): 10 mg via ORAL
  Filled 2024-08-06 (×3): qty 2

## 2024-08-06 MED ORDER — MIRTAZAPINE 15 MG PO TABS
15.0000 mg | ORAL_TABLET | Freq: Every day | ORAL | Status: DC
  Administered 2024-08-06: 03:00:00 15 mg via ORAL
  Filled 2024-08-06: qty 1

## 2024-08-06 MED ORDER — GABAPENTIN 300 MG PO CAPS
300.0000 mg | ORAL_CAPSULE | Freq: Four times a day (QID) | ORAL | Status: DC
  Administered 2024-08-06 (×3): 300 mg via ORAL
  Filled 2024-08-06 (×3): qty 1

## 2024-08-06 MED ORDER — LEVETIRACETAM 500 MG PO TABS
1500.0000 mg | ORAL_TABLET | Freq: Two times a day (BID) | ORAL | Status: DC
  Administered 2024-08-06 (×2): 1500 mg via ORAL
  Filled 2024-08-06 (×2): qty 3

## 2024-08-06 MED ORDER — LAMOTRIGINE 100 MG PO TABS
50.0000 mg | ORAL_TABLET | Freq: Two times a day (BID) | ORAL | Status: DC
  Administered 2024-08-06 (×2): 50 mg via ORAL
  Filled 2024-08-06: qty 2
  Filled 2024-08-06: qty 1

## 2024-08-06 MED ORDER — BUPIVACAINE HCL (PF) 0.5 % IJ SOLN
INTRAMUSCULAR | Status: AC
  Filled 2024-08-06: qty 30

## 2024-08-06 MED ORDER — DROPERIDOL 2.5 MG/ML IJ SOLN: 0.6250 mg | Freq: Once | INTRAMUSCULAR | Status: DC | PRN

## 2024-08-06 MED ORDER — LEVOFLOXACIN IN D5W 750 MG/150ML IV SOLN: 750.0000 mg | INTRAVENOUS | Status: DC

## 2024-08-06 MED ORDER — ONDANSETRON HCL 4 MG PO TABS: 4.0000 mg | ORAL_TABLET | Freq: Four times a day (QID) | ORAL | Status: DC | PRN

## 2024-08-06 MED ORDER — DOXYCYCLINE HYCLATE 100 MG PO TABS: 100.0000 mg | ORAL_TABLET | Freq: Two times a day (BID) | ORAL | Status: DC

## 2024-08-06 MED ORDER — ONDANSETRON HCL 4 MG/2ML IJ SOLN
INTRAMUSCULAR | Status: DC | PRN
  Administered 2024-08-06: 12:00:00 4 mg via INTRAVENOUS

## 2024-08-06 MED ORDER — VANCOMYCIN HCL 1500 MG/300ML IV SOLN: 1500.0000 mg | INTRAVENOUS | Status: DC

## 2024-08-06 MED ORDER — HYDROMORPHONE HCL 1 MG/ML IJ SOLN
0.5000 mg | INTRAMUSCULAR | Status: DC | PRN
  Filled 2024-08-06: qty 1

## 2024-08-06 MED ORDER — ACETAMINOPHEN 650 MG RE SUPP: 650.0000 mg | Freq: Four times a day (QID) | RECTAL | Status: DC | PRN

## 2024-08-06 MED ORDER — FENTANYL CITRATE (PF) 100 MCG/2ML IJ SOLN: 25.0000 ug | INTRAMUSCULAR | Status: DC | PRN

## 2024-08-06 MED ORDER — 0.9 % SODIUM CHLORIDE (POUR BTL) OPTIME
TOPICAL | Status: DC | PRN
  Administered 2024-08-06: 12:00:00 250 mL

## 2024-08-06 MED ORDER — ONDANSETRON HCL 4 MG PO TABS
4.0000 mg | ORAL_TABLET | Freq: Three times a day (TID) | ORAL | 0 refills | Status: DC | PRN
  Filled 2024-08-06: qty 20, 7d supply, fill #0

## 2024-08-06 MED ORDER — OXYCODONE HCL 5 MG PO TABS: 5.0000 mg | ORAL_TABLET | Freq: Once | ORAL | Status: DC | PRN

## 2024-08-06 MED ORDER — MIDAZOLAM HCL 2 MG/2ML IJ SOLN
INTRAMUSCULAR | Status: AC
  Filled 2024-08-06: qty 2

## 2024-08-06 MED ORDER — MELATONIN 5 MG PO TABS
10.0000 mg | ORAL_TABLET | Freq: Every day | ORAL | Status: DC
  Administered 2024-08-06: 03:00:00 10 mg via ORAL
  Filled 2024-08-06: qty 2

## 2024-08-06 MED ORDER — LIDOCAINE HCL 1 % IJ SOLN
INTRAMUSCULAR | Status: DC | PRN
  Administered 2024-08-06: 12:00:00 5 mL

## 2024-08-06 MED ORDER — MIDAZOLAM HCL (PF) 2 MG/2ML IJ SOLN
INTRAMUSCULAR | Status: DC | PRN
  Administered 2024-08-06: 12:00:00 2 mg via INTRAVENOUS

## 2024-08-06 MED ORDER — VANCOMYCIN HCL 750 MG/150ML IV SOLN
750.0000 mg | Freq: Once | INTRAVENOUS | Status: AC
  Administered 2024-08-06: 03:00:00 750 mg via INTRAVENOUS
  Filled 2024-08-06: qty 150

## 2024-08-06 MED ORDER — OXYCODONE HCL 5 MG/5ML PO SOLN: 5.0000 mg | Freq: Once | ORAL | Status: DC | PRN

## 2024-08-06 MED ORDER — PANCRELIPASE (LIP-PROT-AMYL) 12000-38000 UNITS PO CPEP
24000.0000 [IU] | ORAL_CAPSULE | Freq: Three times a day (TID) | ORAL | Status: DC
  Filled 2024-08-06 (×2): qty 2

## 2024-08-06 MED ORDER — ONDANSETRON HCL 4 MG/2ML IJ SOLN: 4.0000 mg | Freq: Four times a day (QID) | INTRAMUSCULAR | Status: DC | PRN

## 2024-08-06 MED ORDER — DOXYCYCLINE HYCLATE 100 MG PO TABS
100.0000 mg | ORAL_TABLET | Freq: Two times a day (BID) | ORAL | 0 refills | Status: AC
  Filled 2024-08-06: qty 14, 7d supply, fill #0

## 2024-08-06 MED ORDER — LIDOCAINE HCL (PF) 1 % IJ SOLN
INTRAMUSCULAR | Status: AC
  Filled 2024-08-06: qty 30

## 2024-08-06 MED ORDER — MIDODRINE HCL 5 MG PO TABS: 5.0000 mg | ORAL_TABLET | Freq: Three times a day (TID) | ORAL | Status: DC | PRN

## 2024-08-06 MED ORDER — CARBAMAZEPINE ER 100 MG PO TB12
100.0000 mg | ORAL_TABLET | Freq: Two times a day (BID) | ORAL | Status: DC
  Administered 2024-08-06 (×2): 100 mg via ORAL
  Filled 2024-08-06 (×2): qty 1

## 2024-08-06 MED ORDER — ACETAMINOPHEN 325 MG PO TABS: 650.0000 mg | ORAL_TABLET | Freq: Four times a day (QID) | ORAL | Status: DC | PRN

## 2024-08-06 MED ORDER — BUPIVACAINE HCL 0.5 % IJ SOLN
INTRAMUSCULAR | Status: DC | PRN
  Administered 2024-08-06: 12:00:00 5 mL

## 2024-08-06 MED ORDER — PHENYLEPHRINE 80 MCG/ML (10ML) SYRINGE FOR IV PUSH (FOR BLOOD PRESSURE SUPPORT)
PREFILLED_SYRINGE | INTRAVENOUS | Status: DC | PRN
  Administered 2024-08-06: 12:00:00 80 ug via INTRAVENOUS
  Administered 2024-08-06 (×2): 160 ug via INTRAVENOUS

## 2024-08-06 MED ORDER — LACTATED RINGERS IV SOLN: INTRAVENOUS | Status: DC | PRN

## 2024-08-06 MED ADMIN — Fentanyl Citrate Preservative Free (PF) Inj 100 MCG/2ML: 50 ug | INTRAVENOUS | @ 12:00:00 | NDC 72572017025

## 2024-08-06 MED ADMIN — Propofol IV Emul 500 MG/50ML (10 MG/ML): 80 ug/kg/min | INTRAVENOUS | @ 12:00:00 | NDC 00069023420

## 2024-08-06 MED ADMIN — Nicotine TD Patch 24HR 21 MG/24HR: 21 mg | TRANSDERMAL | @ 01:00:00 | NDC 43598044828

## 2024-08-06 MED FILL — Oxycodone HCl Tab 10 MG: 10.0000 mg | ORAL | 4 days supply | Qty: 15 | Fill #0 | Status: CN

## 2024-08-06 MED FILL — Nicotine TD Patch 24HR 21 MG/24HR: 21.0000 mg | TRANSDERMAL | Qty: 1 | Status: AC

## 2024-08-06 MED FILL — Albuterol Sulfate Inhal Aero 108 MCG/ACT (90MCG Base Equiv): 2.0000 | RESPIRATORY_TRACT | 30 days supply | Qty: 18 | Fill #0 | Status: AC

## 2024-08-06 SURGICAL SUPPLY — 59 items
BLADE MED AGGRESSIVE (BLADE) IMPLANT
BLADE OSC/SAGITTAL MD 5.5X18 (BLADE) IMPLANT
BLADE SURG 15 STRL LF DISP TIS (BLADE) ×2 IMPLANT
BLADE SURG MINI STRL (BLADE) IMPLANT
BNDG COHESIVE 4X5 TAN STRL LF (GAUZE/BANDAGES/DRESSINGS) ×1 IMPLANT
BNDG ELASTIC 4INX 5YD STR LF (GAUZE/BANDAGES/DRESSINGS) ×1 IMPLANT
BNDG ESMARCH 4X12 STRL LF (GAUZE/BANDAGES/DRESSINGS) ×1 IMPLANT
BNDG GAUZE DERMACEA FLUFF 4 (GAUZE/BANDAGES/DRESSINGS) ×1 IMPLANT
BNDG STRETCH GAUZE 3IN X12FT (GAUZE/BANDAGES/DRESSINGS) ×1 IMPLANT
CNTNR URN SCR LID CUP LEK RST (MISCELLANEOUS) IMPLANT
CUFF TOURN SGL QUICK 12 (TOURNIQUET CUFF) IMPLANT
CUFF TOURN SGL QUICK 18X4 (TOURNIQUET CUFF) IMPLANT
CUFF TRNQT CYL 24X4X16.5-23 (TOURNIQUET CUFF) IMPLANT
DRAPE FLUOR MINI C-ARM 54X84 (DRAPES) IMPLANT
DRSG EMULSION OIL 3X3 NADH (GAUZE/BANDAGES/DRESSINGS) IMPLANT
DRSG EMULSION OIL 3X8 NADH (GAUZE/BANDAGES/DRESSINGS) ×1 IMPLANT
DRSG TELFA 3X8 NADH STRL (GAUZE/BANDAGES/DRESSINGS) ×1 IMPLANT
DURAPREP 26ML APPLICATOR (WOUND CARE) ×1 IMPLANT
ELECTRODE REM PT RTRN 9FT ADLT (ELECTROSURGICAL) ×1 IMPLANT
GAUZE PACKING 0.25INX5YD STRL (GAUZE/BANDAGES/DRESSINGS) IMPLANT
GAUZE PAD ABD 8X10 STRL (GAUZE/BANDAGES/DRESSINGS) IMPLANT
GAUZE SPONGE 4X4 12PLY STRL (GAUZE/BANDAGES/DRESSINGS) ×1 IMPLANT
GAUZE STRETCH 2X75IN STRL (MISCELLANEOUS) ×1 IMPLANT
GAUZE XEROFORM 1X8 LF (GAUZE/BANDAGES/DRESSINGS) ×1 IMPLANT
GLOVE BIOGEL PI IND STRL 7.5 (GLOVE) ×1 IMPLANT
GLOVE SURG SYN 7.5 PF PI (GLOVE) ×1 IMPLANT
GOWN STRL REUS W/ TWL LRG LVL3 (GOWN DISPOSABLE) IMPLANT
GOWN STRL REUS W/ TWL XL LVL3 (GOWN DISPOSABLE) ×1 IMPLANT
GOWN STRL REUS W/TWL MED LVL3 (GOWN DISPOSABLE) ×1 IMPLANT
HANDPIECE VERSAJET DEBRIDEMENT (MISCELLANEOUS) IMPLANT
IV 0.9% NACL 1000 ML (IV SOLUTION) ×1 IMPLANT
KIT TURNOVER KIT A (KITS) ×1 IMPLANT
LABEL OR SOLS (LABEL) ×1 IMPLANT
MANIFOLD NEPTUNE II (INSTRUMENTS) ×1 IMPLANT
NDL BIOPSY JAMSHIDI 11X6 (NEEDLE) IMPLANT
NDL FILTER BLUNT 18X1 1/2 (NEEDLE) ×1 IMPLANT
NDL HYPO 25X1 1.5 SAFETY (NEEDLE) ×1 IMPLANT
NEEDLE BIOPSY JAMSHIDI 11X6 (NEEDLE) IMPLANT
NEEDLE FILTER BLUNT 18X1 1/2 (NEEDLE) ×1 IMPLANT
NEEDLE HYPO 25X1 1.5 SAFETY (NEEDLE) ×1 IMPLANT
NS IRRIG 500ML POUR BTL (IV SOLUTION) ×1 IMPLANT
PACK EXTREMITY ARMC (MISCELLANEOUS) ×1 IMPLANT
PACKING GAUZE IODOFORM 1INX5YD (GAUZE/BANDAGES/DRESSINGS) IMPLANT
PAD ABD DERMACEA PRESS 5X9 (GAUZE/BANDAGES/DRESSINGS) ×1 IMPLANT
PAD PREP OB/GYN DISP 24X41 (PERSONAL CARE ITEMS) ×1 IMPLANT
PENCIL SMOKE EVACUATOR (MISCELLANEOUS) ×1 IMPLANT
SOL .9 NS 3000ML IRR UROMATIC (IV SOLUTION) IMPLANT
SOLN STERILE WATER 500 ML (IV SOLUTION) ×1 IMPLANT
SOLUTION PREP PVP 2OZ (MISCELLANEOUS) ×1 IMPLANT
STAPLER SKIN PROX 35W (STAPLE) ×1 IMPLANT
STOCKINETTE 48X4 2 PLY STRL (GAUZE/BANDAGES/DRESSINGS) ×1 IMPLANT
STOCKINETTE IMPERVIOUS 9X36 MD (GAUZE/BANDAGES/DRESSINGS) ×1 IMPLANT
SUT MNCRL AB 3-0 PS2 27 (SUTURE) IMPLANT
SUT PROLENE 3 0 PS 2 (SUTURE) ×1 IMPLANT
SUTURE ETHLN 4-0 FS2 18XMF BLK (SUTURE) IMPLANT
SWAB CULTURE AMIES ANAERIB BLU (MISCELLANEOUS) IMPLANT
SYR 10ML LL (SYRINGE) ×2 IMPLANT
TIP FAN IRRIG PULSAVAC PLUS (DISPOSABLE) IMPLANT
TRAP FLUID SMOKE EVACUATOR (MISCELLANEOUS) ×1 IMPLANT

## 2024-08-06 NOTE — Transfer of Care (Signed)
 Immediate Anesthesia Transfer of Care Note  Patient: Abigail [name]  Procedure(s) Performed: AMPUTATION, TOE (Right: Toe)  Patient Location: PACU  Anesthesia Type:General  Level of Consciousness: drowsy  Airway & Oxygen  Therapy: Patient Spontanous Breathing and Patient connected to face mask oxygen   Post-op Assessment: Report given to RN  Post vital signs: stable  Last Vitals:  Vitals Value Taken Time  BP 95/48 08/06/24 12:38  Temp    Pulse 75 08/06/24 12:40  Resp 12 08/06/24 12:40  SpO2 94 % 08/06/24 12:40  Vitals shown include unfiled device data.  Last Pain:  Vitals:   08/06/24 1140  TempSrc: Temporal  PainSc: 7          Complications: No notable events documented.

## 2024-08-06 NOTE — Consult Note (Signed)
 PODIATRY CONSULTATION  NAME Abigail [name] [facility] MRN [id number] DOB [birth year] DOA 08/05/2024   Reason for consult:  Chief Complaint  Patient presents with   Toe Infection   Hypotension    Attending/Consulting physician: S. Patel MD  History of present illness: Abigail [name] [facility] is a 58 y.o. female with medical history significant for pancreatic pseudocyst, chronic back pain pain on opiates, seizure disorder, PE 12/2023 on Eliquis , asthma, chronic O2 use, lower extremity venous stasis(hypoalbuminemia favored over CHF, per last discharge summary 05/2024), and multiple prior hospitalizations with metabolic encephalopathy attributed to sedating meds, being admitted for right foot infection-cellulitis/osteomyelitis/possible abscess.She was sent in by her podiatrist to whom she presented today with a 1 week history of ongoing pain as well as blisters of the right forefoot in spite of doxycycline  prescribed a week prior by urgent care.  Pt seen in pre op area. Discussed my interpretation of the MRI, concern for bone infection in the right second toe. She is surprised it could get so infected so quickly. Discussed treatment options for infection including surgery or antibiotics. She is tearful at having to have amputation but understands if it is needed.   Past Medical History:  Diagnosis Date   Anxiety    Arthritis    Back pain    Basal cell carcinoma 10/04/2021   R axilla - needs excised 11/28/21   Basal cell carcinoma 10/04/2021   L antecubital excised 11/14/21   Diarrhea 11/12/2016   Fibromyalgia    Generalized abdominal pain 11/12/2016   H. pylori infection    Hyperlipidemia    IBS (irritable bowel syndrome)    Infectious colitis 04/29/2016   Migraines    Moderate dehydration 04/29/2016   Muscle pain    Opioid overdose (HCC) 12/07/2022   Reflux    Seizures (HCC)    Unexplained weight loss 11/12/2016       Latest Ref Rng & Units 08/05/2024    8:46 PM  06/12/2024    5:28 AM 06/11/2024    4:06 AM  CBC  WBC 4.0 - 10.5 K/uL 8.0  6.2  7.2   Hemoglobin 12.0 - 15.0 g/dL 87.0  9.4  8.3   Hematocrit 36.0 - 46.0 % 41.2  31.1  28.0   Platelets 150 - 400 K/uL 258  260  262        Latest Ref Rng & Units 08/05/2024    8:46 PM 07/20/2024    3:55 PM 06/12/2024    5:28 AM  BMP  Glucose 70 - 99 mg/dL 894  69  892   BUN 6 - 20 mg/dL 19  13  7    Creatinine 0.44 - 1.00 mg/dL 9.23  9.28  9.44   BUN/Creat Ratio 9 - 23  18    Sodium 135 - 145 mmol/L 138  138  145   Potassium 3.5 - 5.1 mmol/L 3.7  4.2  3.5   Chloride 98 - 111 mmol/L 94  93  101   CO2 22 - 32 mmol/L 33  28  35   Calcium  8.9 - 10.3 mg/dL 9.6  9.7  8.1       Physical Exam: Lower Extremity Exam  R 2nd toe erythema and edema with large ulceration medial aspect PIPJ  Erythema and edema right 2nd toe  DP and PT  pulses 2+ palpable  Sensation intact to toes   ASSESSMENT/PLAN OF CARE 58 y.o. female with PMHx significant for   pancreatic pseudocyst, chronic back pain pain  on opiates, seizure disorder, PE 12/2023 on Eliquis , asthma, chronic O2 use, lower extremity venous stasis  with osteomyelitis of right 2nd toe with ulceration to capsule level  - NPO for OR R 2nd toe amputation, she agrees to proceed after discussion of alternatives risks benefits.  - Continue IV abx broad spectrum pending further culture data - Anticoagulation: ok to resume home eliquis  post op - Wound care: none required post op  - WB status: WBAT in post op shoe - Will continue to follow   Thank you for the consult.  Please contact me directly with any questions or concerns.           Abigail [name], DPM [facility]    [address] [place], [place] [address]                Office [phone]  Fax [phone]

## 2024-08-06 NOTE — Assessment & Plan Note (Addendum)
 Multiple antibiotic allergies Levaquin  and vancomycin  (allergic to cephalosporins and sulfa) Pain control Keep leg elevated Follow-up MRI of the foot Keeping n.p.o. Holding Eliquis  in case of procedure Podiatry consult

## 2024-08-06 NOTE — Care Management CC44 (Signed)
 Condition Code 44 Documentation Completed  Patient Details  Name: Adrinne Sze [facility] MRN: [id number] Date of Birth: [birth year]   Condition Code 44 given:  Yes Patient signature on Condition Code 44 notice:  Yes Documentation of 2 MD's agreement:  Yes Code 44 added to claim:  Yes    Nathanael CHRISTELLA Ring, RN 08/06/2024, 2:43 PM

## 2024-08-06 NOTE — Assessment & Plan Note (Signed)
 Chronic pain History of polypharmacy related acute encephalopathy Continue gabapentin  and duloxetine  on oxycodone  Neurologic checks

## 2024-08-06 NOTE — Plan of Care (Signed)

## 2024-08-06 NOTE — Assessment & Plan Note (Addendum)
 BP 83/60, improved with midodrine  and IV fluids Sepsis not suspected at this time Midodrine  as needed Continue to monitor

## 2024-08-06 NOTE — Discharge Instructions (Addendum)
 Pt to leave the current dressing on till seen by Podiatry next week Use post op boot while ambulating

## 2024-08-06 NOTE — Anesthesia Postprocedure Evaluation (Signed)
 Anesthesia Post Note  Patient: Abigail [name] [facility]  Procedure(s) Performed: AMPUTATION, TOE (Right: Toe)  Patient location during evaluation: PACU Anesthesia Type: General Level of consciousness: awake and alert Pain management: pain level controlled Vital Signs Assessment: post-procedure vital signs reviewed and stable Respiratory status: spontaneous breathing, nonlabored ventilation, respiratory function stable and patient connected to nasal cannula oxygen  Cardiovascular status: blood pressure returned to baseline and stable Postop Assessment: no apparent nausea or vomiting Anesthetic complications: no   No notable events documented.   Last Vitals:  Vitals:   08/06/24 1140 08/06/24 1238  BP: 112/63 (!) 95/48  Pulse: 88 77  Resp: 16 14  Temp: (!) 36.3 C 36.6 C  SpO2: 93% 93%    Last Pain:  Vitals:   08/06/24 1238  TempSrc:   PainSc: 0-No pain                 Lynwood KANDICE Clause

## 2024-08-06 NOTE — ED Notes (Signed)
 Patient placed on 2L [place] due to occasional desats in the 80s.

## 2024-08-06 NOTE — Plan of Care (Signed)

## 2024-08-06 NOTE — Anesthesia Preprocedure Evaluation (Signed)
 Anesthesia Evaluation  Patient identified by MRN, date of birth, ID band Patient awake    Reviewed: Allergy & Precautions, H&P , NPO status , Patient's Chart, lab work & pertinent test results, reviewed documented beta blocker date and time   Airway Mallampati: II  TM Distance: >3 FB Neck ROM: full    Dental no notable dental hx. (+) Teeth Intact   Pulmonary neg shortness of breath, asthma , COPD,  COPD inhaler, Current Smoker   Pulmonary exam normal breath sounds clear to auscultation       Cardiovascular Exercise Tolerance: Poor hypertension, On Medications +CHF  (-) Orthopnea  Rhythm:regular Rate:Normal     Neuro/Psych  Headaches, Seizures -,  PSYCHIATRIC DISORDERS Anxiety Depression     Neuromuscular disease    GI/Hepatic Neg liver ROS,GERD  ,,  Endo/Other  diabetesHypothyroidism    Renal/GU      Musculoskeletal   Abdominal   Peds  Hematology  (+) Blood dyscrasia, anemia   Anesthesia Other Findings   Reproductive/Obstetrics negative OB ROS                              Anesthesia Physical Anesthesia Plan  ASA: 3  Anesthesia Plan: MAC   Post-op Pain Management:    Induction:   PONV Risk Score and Plan: 2  Airway Management Planned:   Additional Equipment:   Intra-op Plan:   Post-operative Plan:   Informed Consent: I have reviewed the patients History and Physical, chart, labs and discussed the procedure including the risks, benefits and alternatives for the proposed anesthesia with the patient or authorized representative who has indicated his/her understanding and acceptance.       Plan Discussed with: CRNA  Anesthesia Plan Comments:         Anesthesia Quick Evaluation

## 2024-08-06 NOTE — Assessment & Plan Note (Signed)
 Continue Tegretol , gabapentin , lamotrigine  and Keppra 

## 2024-08-06 NOTE — Assessment & Plan Note (Signed)
 No acute issues suspected

## 2024-08-06 NOTE — Care Management Obs Status (Signed)
 MEDICARE OBSERVATION STATUS NOTIFICATION   Patient Details  Name: Abigail [name] MRN: [id number] Date of Birth: 20-Jan-1966   Medicare Observation Status Notification Given:  Yes    Nathanael CHRISTELLA Ring, RN 08/06/2024, 2:43 PM

## 2024-08-06 NOTE — Op Note (Signed)
 Full Operative Report  Date of Operation: 12:42 PM, 08/06/2024   Patient: Abigail [name] - 58 y.o. female  Surgeon: Malvin Marsa FALCON, DPM   Assistant: None  Diagnosis: Osteomyelitis of right second toe  Procedure:  1. Amputation of Right 2nd toe at MPJ level    Anesthesia: Anesthesia type not filed in the log.  No responsible provider has been recorded for the case.  Anesthesiologist: Leavy Ned, MD CRNA: Rosine Shona Jansky, CRNA   Estimated Blood Loss: Minimal   Hemostasis: 1) Anatomical dissection, mechanical compression, electrocautery 2) No tourniquet   Implants: * No implants in log *  Materials: Prolene 3-0  Injectables: 1) Pre-operatively: 10 cc of 50:50 mixture 1%lidocaine  plain and 0.5% marcaine  plain 2) Post-operatively: None   Specimens: - Pathology: Right 2nd toe - Microbiology: Deep tissue culture right 2nd toe   Antibiotics: IV antibiotics given per schedule on the floor  Drains: None  Complications: Patient tolerated the procedure well without complication.   Operative findings: As below in detailed report  Indications for Procedure: [name] presents to [place], Marsa FALCON, [place] with a chief complaint of osteomyelitis right 2nd toe with ulcer at medial PIPJ. The patient has failed conservative treatments of various modalities. At this time the patient has elected to proceed with surgical correction. All alternatives, risks, and complications of the procedures were thoroughly explained to the patient. Patient exhibits appropriate understanding of all discussion points and informed consent was signed and obtained in the chart with no guarantees to surgical outcome given or implied.  Description of Procedure: Patient was brought to the operating room. Patient remained on their hospital bed in the supine position. A surgical timeout was performed and all members of the operating room, the procedure, and the  surgical site were identified. anesthesia occurred as per anesthesia record. Local anesthetic as previously described was then injected about the operative field in a local infiltrative block.  The operative lower extremity as noted above was then prepped and draped in the usual sterile manner. The following procedure then began.  Attention was directed to the 2nd digit on the RIGHT foot. A full-thickness incision encompassing the entire digit was made using a #15 blade. Dissection was carried down to bone. The toe was secured with a towel clamp, further dissected in its entirety, and disarticulated at the MPJ and passed to the back table as a gross specimen. This was then labled and sent to pathology. A deep tissue culture was obtained at this time with rongeur from the toe. The bone was noted to be soft and eroded, and consistent with osteomyelitis. All remaining necrotic and devitalized soft tissue structures were visualized and dissected away using sharp and dull dissection. Care was taken to protect all neurovascular structures throughout the dissection. All bleeders were cauterized as necessary.  The area was then flushed with copious amounts of sterile saline. Then using the suture materials previously described, the site was closed in anatomic layers and the skin was well approximated under minimal tension.   The surgical site was then dressed with xeroform 4x4 kerlix ace wrap. The patient tolerated both the procedure and anesthesia well with vital signs stable throughout. The patient was transferred in good condition and all vital signs stable  from the OR to recovery under the discretion of anesthesia.  Condition: Vital signs stable, neurovascular status unchanged from preoperative   Surgical plan:  Expect clean margin, stable for DC home later today or tomorrow AM with 5  days doxycyline, wbat in post op shoe. Follow up in office next tues/wed. WBAT in post op shoe, leave surgical dressing clean  dry intact until follow up.  The patient will be WBAT in a posto p shoe to the operative limb until further instructed. The dressing is to remain clean, dry, and intact. Will continue to follow unless noted elsewhere.   Marsa Honour, DPM [facility]

## 2024-08-06 NOTE — TOC Initial Note (Signed)
 Transition of Care [facility]) - Initial/Assessment Note    Patient Details  Name: Abigail [name] [facility] MRN: [id number] Date of Birth: 1965-10-03  Transition of Care [facility]) CM/SW Contact:    Nathanael CHRISTELLA Ring, RN Phone Number: 08/06/2024, 2:55 PM  Clinical Narrative:                 Met with patient at the bedside before she went to surgery to have part of her toe amputated.  She is from home where she lives with her mother.  She has current home health services through [place], Confirmed with [facility], open for nursing.   After surgery provider let this CM know that patient would be discharged.  Kelsey notified for [facility] services to be resumed.  Patient needs a nebulizer, ordered from [employer] and to be delivered to the home.  Code 44 reviewed with patient at the bedside and copy given for her records.  No other questions or concerns at this time.   Expected Discharge Plan: Home w Home Health Services Barriers to Discharge: Continued Medical Work up   Patient Goals and CMS Choice            Expected Discharge Plan and Services   Discharge Planning Services: CM Consult Post Acute Care Choice: Home Health Living arrangements for the past 2 months: Single Family Home Expected Discharge Date: 08/06/24               DME Arranged: Nebulizer machine DME Agency: [employer] Date DME Agency Contacted: 08/06/24 Time DME Agency Contacted: 1420 Representative spoke with at DME Agency: Mitch HH Arranged: RN HH Agency: [facility] Date HH Agency Contacted: 08/06/24 Time HH Agency Contacted: 1454 Representative spoke with at [facility] Agency: [facility]  Prior Living Arrangements/Services Living arrangements for the past 2 months: Single Family Home Lives with:: Parents Patient language and need for interpreter reviewed:: Yes Do you feel safe going back to the place where you live?: Yes      Need for Family Participation in Patient Care: Yes (Comment) Care giver support system in place?: Yes  (comment) Current home services: DME, Home RN Criminal Activity/Legal Involvement Pertinent to Current Situation/Hospitalization: No - Comment as needed  Activities of Daily Living   ADL Screening (condition at time of admission) Independently performs ADLs?: No Does the patient have a NEW difficulty with bathing/dressing/toileting/self-feeding that is expected to last >3 days?: No Does the patient have a NEW difficulty with getting in/out of bed, walking, or climbing stairs that is expected to last >3 days?: No Does the patient have a NEW difficulty with communication that is expected to last >3 days?: No Is the patient deaf or have difficulty hearing?: No Does the patient have difficulty seeing, even when wearing glasses/contacts?: No Does the patient have difficulty concentrating, remembering, or making decisions?: No  Permission Sought/Granted   Permission granted to share information with : Yes, Verbal Permission Granted     Permission granted to share info w AGENCY: [facility]        Emotional Assessment Appearance:: Appears stated age Attitude/Demeanor/Rapport: Engaged Affect (typically observed): Accepting Orientation: : Oriented to Self, Oriented to Place, Oriented to  Time, Oriented to Situation Alcohol / Substance Use: Not Applicable Psych Involvement: No (comment)  Admission diagnosis:  GAD (generalized anxiety disorder) [F41.1] Insomnia due to medical condition [G47.01] 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] Cellulitis of right foot [L03.115] Depression, major, single episode, moderate (HCC) [F32.1] MDD (major depressive disorder), single episode, severe (HCC) [F32.2] Nonintractable epilepsy without status  epilepticus, unspecified epilepsy type (HCC) [G40.909] Osteomyelitis of right foot, unspecified type (HCC) [M86.9] Patient Active Problem List   Diagnosis Date Noted   Osteomyelitis of second toe of right foot (HCC) 08/06/2024   Cellulitis of right foot  08/06/2024   Cellulitis and abscess of toe of right foot 08/05/2024   Peripheral neuropathy 07/24/2024   MDD (major depressive disorder), single episode, severe (HCC) 06/29/2024   GAD (generalized anxiety disorder) 06/29/2024   Insomnia due to medical condition 06/29/2024   Iron  deficiency anemia 06/22/2024   Macrocytic anemia 06/08/2024   Normocytic anemia 06/08/2024   Low folate 05/13/2024   History of pulmonary embolus (PE) 05/03/2024   Seizure disorder (HCC) 05/03/2024   GERD without esophagitis 05/03/2024   Chronic obstructive pulmonary disease (COPD) (HCC) 05/03/2024   Chronic hypotension 04/28/2024   Malnutrition of moderate degree 04/06/2024   Hypoalbuminemia 04/04/2024   Hypocalcemia 04/04/2024   Congestive heart failure (CHF) (HCC) 04/04/2024   Hypovolemia 03/19/2024   Venous stasis chronic, with ulcers (HCC) 03/11/2024   Fatigue 03/04/2024   Frequent falls 03/04/2024   Pulmonary embolism and infarction (HCC) 01/02/2024   COPD (chronic obstructive pulmonary disease) (HCC) 12/29/2023   Hypothyroidism 11/02/2023   Hyponatremia 09/27/2023   Chronic pancreatitis (HCC) 07/30/2023   Chronic respiratory failure with hypoxia (HCC) 07/29/2023   Pancreatic pseudocyst 07/13/2023   History of seizure 06/28/2023   Epilepsy, (HCC) 06/28/2023   Osteopenia 05/29/2023   Giant cell arteritis (HCC) 12/19/2022   DNR (do not resuscitate) 12/19/2022   Hypokalemia 12/08/2022   Elevated random blood glucose level 08/12/2022   Idiopathic acute pancreatitis without infection or necrosis 08/08/2022   Tobacco use disorder 08/08/2022   Asthma 04/26/2020   Drug-induced constipation 04/26/2020   GERD (gastroesophageal reflux disease) 11/12/2016   Chronic prescription opiate use - thru [facility] in [place], [place] 04/29/2016   Long term current use of immunosuppressive drug 04/29/2016   Depression with anxiety 02/16/2013   Anxiety 02/13/2013   DDD (degenerative disc disease), cervical 02/13/2013    PCP:  [name], Susan K, MD Pharmacy:   [facility] [phone] - [place], [place] - [address] [place] [address] Phone: [phone] Fax: [phone]  [name] [address] [place] [address] Phone: [phone] Fax: [phone]  [facility] [address] [place] [address] Phone: [phone] Fax: [phone]     Social Drivers of Health (SDOH) Social History: SDOH Screenings   Food Insecurity: No Food Insecurity (08/06/2024)  Housing: Low Risk (08/06/2024)  Transportation Needs: No Transportation Needs (08/06/2024)  Utilities: Not At Risk (08/06/2024)  Depression (PHQ2-9): High Risk (07/20/2024)  Financial Resource Strain: Medium Risk (06/09/2024)  Physical Activity: Inactive (01/27/2024)   Received from [facility]  Social Connections: Moderately Isolated (01/27/2024)   Received from [facility]  Stress: Stress Concern Present (06/09/2024)  Tobacco Use: High Risk (08/06/2024)  Health Literacy: Adequate Health Literacy (06/09/2024)   SDOH Interventions:     Readmission Risk Interventions    08/06/2024   12:44 PM 06/11/2024   12:35 PM 04/06/2024    2:40 PM  Readmission Risk Prevention Plan  Transportation Screening Complete Complete Complete  Medication Review Oceanographer) Complete Complete Complete  PCP or Specialist appointment within 3-5 days of discharge Complete Complete Complete  HRI or Home Care Consult Complete Complete Complete  SW Recovery Care/Counseling Consult Complete Complete Complete  Palliative Care Screening Not Applicable Not Applicable Not  Applicable  Skilled Nursing Facility Not Applicable Not Applicable Not Applicable

## 2024-08-06 NOTE — Discharge Summary (Signed)
 Physician Discharge Summary   Patient: Abigail [name] [facility] MRN: [id number] DOB: [birth year]  Admit date:     08/05/2024  Discharge date: 08/06/2024  Discharge Physician: Leita Blanch   PCP: [name], Susan K, MD   Recommendations at discharge:    F/u Dr Malvin in 1 week Leave the post-op dressing on till you are seen in the office F/u PCP in 1-2 weeks  Discharge Diagnoses: Principal Problem:   Cellulitis and abscess of toe of right foot Active Problems:   Chronic hypotension   History of pulmonary embolus (PE)   Venous stasis chronic, with ulcers (HCC)   Hypoalbuminemia   Seizure disorder (HCC)   Depression with anxiety   Chronic prescription opiate use - thru [facility] in [place], [place]   Asthma   Pancreatic pseudocyst   Chronic respiratory failure with hypoxia (HCC)   Osteomyelitis of second toe of right foot (HCC)  Abigail [name] [name] is a 58 y.o. female with medical history significant for pancreatic pseudocyst, chronic back pain pain on opiates, seizure disorder, PE 12/2023 on Eliquis , asthma, chronic O2 use, lower extremity venous stasis(hypoalbuminemia favored over CHF, per last discharge summary 05/2024), and multiple prior hospitalizations with metabolic encephalopathy attributed to sedating meds, being admitted for right foot infection-cellulitis/osteomyelitis/possible abscess.She was sent in by her podiatrist to whom she presented today with a 1 week history of ongoing pain as well as blisters of the right forefoot in spite of doxycycline  prescribed a week prior by urgent care.  Podiatry plans to consider I&D in the AM.   Osteomyelitis of 2nd toe of right foot Multiple antibiotic allergies --Levaquin  and vancomycin  (allergic to cephalosporins and sulfa)--change it po doxycline per Dr Malvin --ok to go home per Podiatry. Pt is eager to discharge today. Patient tells me she does have family to help her out. 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    Chronic hypotension BP 83/60, improved with midodrine  and IV fluids Sepsis not suspected at this time Midodrine  as needed Pt stable and BP 102/45   History of pulmonary embolus (PE) Pt is not on any coagulation per medication reconciliation   Venous stasis chronic, with ulcers (HCC)  Hypoalbuminemia Had a normal echo 03/2024   Seizure disorder (HCC) --Continue Tegretol , gabapentin , lamotrigine  and Keppra    Depression with anxiety --Continue duloxetine  and mirtazapine    Chronic respiratory failure with hypoxia (HCC) Asthma --Continue as needed inhalers -- DME nebulizer ordered per patient request   Pancreatic pseudocyst --No acute issues suspected   Chronic prescription opiate use - thru [facility] in [place], [place] Chronic pain History of polypharmacy related acute encephalopathy -- per pharmacy patient recently had prescription of oxycodone . Will not be generating a new one. Patient has been informed         Pain control - [place]  Controlled Substance Reporting System database was reviewed. and patient was instructed, not to drive, operate heavy machinery, perform activities at heights, swimming or participation in water activities or provide baby-sitting services while on Pain, Sleep and Anxiety Medications; until their outpatient Physician has advised to do so again. Also recommended to not to take more than prescribed Pain, Sleep and Anxiety Medications.  Consultants: podiatry Dr. Malvin Procedures performed: right second toe MPJ amputation Disposition: Home Diet recommendation:  Cardiac diet DISCHARGE MEDICATION: Allergies as of 08/06/2024       Reactions   Nsaids Hives   Tapentadol Swelling, Rash, Other (See Comments)   Nucynta- Made her deathly sick  Cephalexin  Other (See Comments)   Reaction not cited. Has tolerated Ceftriaxone  and Unasyn     Codeine Other (See Comments)   Reaction not cited   Darvocet [propoxyphene  N-acetaminophen ] Other (See Comments)   Reaction not cited   Latex Other (See Comments)   Reaction not cited   Silicone Other (See Comments)   Reaction not cited   Sulfa Antibiotics Hives   Tape Other (See Comments)   Reaction not cited   Meloxicam Rash        Medication List     STOP taking these medications    doxycycline  100 MG capsule Commonly known as: VIBRAMYCIN  Replaced by: doxycycline  100 MG tablet   NON FORMULARY   Santyl  250 UNIT/GM ointment Generic drug: collagenase        TAKE these medications    acetaminophen  500 MG tablet Commonly known as: TYLENOL  Take 1,000 mg by mouth every 6 (six) hours as needed for moderate pain (pain score 4-6).   albuterol  108 (90 Base) MCG/ACT inhaler Commonly known as: VENTOLIN  HFA Inhale 2 puffs into the lungs every 6 (six) hours as needed for wheezing or shortness of breath. Take two (2) puffs by mouth every 4 to 6 hours as needed for wheezing or shortness of breath What changed:  when to take this reasons to take this   AMBULATORY NON FORMULARY MEDICATION Knee-high, medium compression, graduated compression stockings. Apply to lower extremities. [email or web address], Zippered Compression Stockings, medium circ, long length   bisacodyl  5 MG EC tablet Generic drug: bisacodyl  Take 1 tablet (5 mg total) by mouth daily as needed for moderate constipation.   carbamazepine  100 MG 12 hr tablet Commonly known as: TEGRETOL  XR Take 1 tablet (100 mg total) by mouth 2 (two) times daily.   doxycycline  100 MG tablet Commonly known as: VIBRA -TABS Take 1 tablet (100 mg total) by mouth every 12 (twelve) hours for 7 days. Replaces: doxycycline  100 MG capsule   DULoxetine  60 MG capsule Commonly known as: CYMBALTA  TAKE 1 CAPSULE BY MOUTH EVERY DAY   feeding supplement Liqd Take 237 mLs by mouth 3 (three) times daily between meals. What changed: additional instructions   folic acid  1 MG tablet Commonly known as:  FOLVITE  Take 1 tablet (1 mg total) by mouth daily.   gabapentin  300 MG capsule Commonly known as: NEURONTIN  Take 1 capsule (300 mg total) by mouth 4 (four) times daily.   lamoTRIgine  25 MG tablet Commonly known as: LAMICTAL  Take 2 tablets (50 mg total) by mouth 2 (two) times daily.   levETIRAcetam  750 MG tablet Commonly known as: KEPPRA  Take 2 tablets (1,500 mg total) by mouth 2 (two) times daily.   Melatonin 10 MG Tabs Take 10 mg by mouth at bedtime.   midodrine  5 MG tablet Commonly known as: PROAMATINE  Take 5 mg by mouth 3 (three) times daily with meals. What changed:  when to take this reasons to take this   mirtazapine  15 MG tablet Commonly known as: REMERON  Take 1 tablet (15 mg total) by mouth at bedtime. No refills without a follow-up appointment.   ondansetron  4 MG tablet Commonly known as: ZOFRAN  Take 1 tablet (4 mg total) by mouth every 8 (eight) hours as needed for nausea or vomiting.   Oxycodone  HCl 10 MG Tabs Take 1 tablet (10 mg total) by mouth every 6 (six) hours as needed. What changed: when to take this   Pancrelipase  (Lip-Prot-Amyl) 24000-76000 units Cpep Take 1 capsule (24,000 Units total) by mouth 3 (three) times daily with  meals.   pantoprazole  40 MG tablet Commonly known as: PROTONIX  Take 1 tablet (40 mg total) by mouth daily.   potassium chloride  10 MEQ tablet Commonly known as: KLOR-CON  M Take 1 tablet (10 mEq total) by mouth 2 (two) times daily.   torsemide  20 MG tablet Commonly known as: DEMADEX  Take 1 tablet (20 mg total) by mouth 2 (two) times daily.   VITAMIN D -3 PO Take 1 capsule by mouth every morning.               Durable Medical Equipment  (From admission, onward)           Start     Ordered   08/06/24 1349  For home use only DME Nebulizer machine  Once       Question Answer Comment  Patient needs a nebulizer to treat with the following condition COPD (chronic obstructive pulmonary disease) (HCC)   Length of  Need Lifetime   Additional equipment included Administration kit   Additional equipment included Filter      08/06/24 1348              Discharge Care Instructions  (From admission, onward)           Start     Ordered   08/06/24 0000  Discharge wound care:       Comments: Leave the dressing on the operative foot till seen by Podiatry   08/06/24 1414            Follow-up Information     [name], Susan K, MD. Schedule an appointment as soon as possible for a visit in 1 week(s).   Specialty: Family Medicine Contact information: [address] [place] [address] [phone]         Malvin Marsa FALCON, DPM. Schedule an appointment as soon as possible for a visit in 1 week(s).   Specialty: Podiatry Why: psot op follow up Contact information: [address] [place] [address] [phone]                Discharge Exam: Fredricka Weights   08/05/24 2052 08/06/24 0600  Weight: 70.3 kg 69.1 kg   Right foot dressing+  Condition at discharge: fair  The results of significant diagnostics from this hospitalization (including imaging, microbiology, ancillary and laboratory) are listed below for reference.   Imaging Studies: DG Foot Complete Right Result Date: 08/01/2024 CLINICAL DATA:  Redness and swelling to second toe with deep ulcer. EXAM: RIGHT FOOT COMPLETE - 3+ VIEW COMPARISON:  04/10/2023. FINDINGS: There is diffusely decreased mineralization of the bones. No acute fracture is seen. There is lateral subluxation of the middle phalanx at the proximal interphalangeal joint of the second digit with bony deformity of the head of the proximal phalanx of the second digit, unchanged from the previous exam. Degenerative changes are noted at the first metatarsophalangeal joint with hallux valgus deformity. Mild erosions are noted along the medial aspect of the head of the first metatarsal. Degenerative changes are present in the  midfoot. There is moderate calcaneal spurring. Soft tissue swelling is noted over the dorsum of the foot and at the second digit. IMPRESSION: 1. Chronic bony deformity of the head of the proximal phalanx of the second digit with lateral subluxation of the middle phalanx at the proximal interphalangeal joint. 2. Mild erosions along the medial aspect of the head of the first metacarpal, which may be associated with gout. 3. Scattered degenerative changes as described above. Electronically Signed  By: Leita Birmingham M.D.   On: 08/01/2024 16:38    Microbiology: Results for orders placed or performed during the hospital encounter of 08/05/24  Blood culture (routine x 2)     Status: None (Preliminary result)   Collection Time: 08/05/24  8:47 PM   Specimen: BLOOD  Result Value Ref Range Status   Specimen Description BLOOD BLOOD RIGHT ARM  Final   Special Requests   Final    BOTTLES DRAWN AEROBIC AND ANAEROBIC Blood Culture adequate volume   Culture   Final    NO GROWTH < 12 HOURS Performed at [facility], [address] [place], [place] [address]    Report Status PENDING  Incomplete  Blood culture (routine x 2)     Status: None (Preliminary result)   Collection Time: 08/05/24  9:01 PM   Specimen: BLOOD  Result Value Ref Range Status   Specimen Description BLOOD BLOOD LEFT FOREARM  Final   Special Requests   Final    BOTTLES DRAWN AEROBIC AND ANAEROBIC Blood Culture results may not be optimal due to an inadequate volume of blood received in culture bottles   Culture   Final    NO GROWTH < 12 HOURS Performed at [facility], [address] [place], [place] [address]    Report Status PENDING  Incomplete    Labs: CBC: Recent Labs  Lab 08/05/24 2046  WBC 8.0  NEUTROABS 4.5  HGB 12.9  HCT 41.2  MCV 88.2  PLT 258   Basic Metabolic Panel: Recent Labs  Lab 08/05/24 2046  NA 138  K 3.7  CL 94*  CO2 33*  GLUCOSE 105*  BUN 19  CREATININE 0.76  CALCIUM  9.6    Liver Function Tests: Recent Labs  Lab 08/05/24 2046  AST 26  ALT 15  ALKPHOS 156*  BILITOT 0.2  PROT 8.0  ALBUMIN  4.3    Discharge time spent: greater than 30 minutes.  Signed: Leita Blanch, MD [facility] 08/06/2024

## 2024-08-06 NOTE — Assessment & Plan Note (Signed)
 Will hold Eliquis  in case of procedure

## 2024-08-06 NOTE — ED Notes (Signed)
 Patient disconnected from monitor to ambulate to restroom. Patient ambulatory with steady gait to restroom. Patient returned to bed without complication.

## 2024-08-06 NOTE — Assessment & Plan Note (Signed)
 Hypoalbuminemia Keep legs elevated Had a normal echo 03/2024

## 2024-08-06 NOTE — H&P (Signed)
 History and Physical    Patient: Lynnox Girten [facility] FMW:[id number] DOB: [birth year] DOA: 08/05/2024 DOS: the patient was seen and examined on 08/06/2024 PCP: [name], Susan K, MD  Patient coming from: Home  Chief Complaint:  Chief Complaint  Patient presents with   Toe Infection   Hypotension    HPI: [facility] is a 58 y.o. female with medical history significant for pancreatic pseudocyst, chronic back pain pain on opiates, seizure disorder, PE 12/2023 on Eliquis , asthma, chronic O2 use, lower extremity venous stasis(hypoalbuminemia favored over CHF, per last discharge summary 05/2024), and multiple prior hospitalizations with metabolic encephalopathy attributed to sedating meds, being admitted for right foot infection-cellulitis/osteomyelitis/possible abscess.She was sent in by her podiatrist to whom she presented today with a 1 week history of ongoing pain as well as blisters of the right forefoot in spite of doxycycline  prescribed a week prior by urgent care.  Podiatry plans to consider I&D in the AM.  She denied fever or chills. In the ED she was initially hypotensive at 83/60, fluid responsive to 125/54 by admission.  Pulse was in the 60s and 70s.  Afebrile. CBC and CMP were unremarkable and at baseline. MRI done podiatry request-results still pending Patient was treated with IV hydration, fentanyl  and given a dose of midodrine , started on Levaquin  and vancomycin . Admission requested.    Past Medical History:  Diagnosis Date   Anxiety    Arthritis    Back pain    Basal cell carcinoma 10/04/2021   R axilla - needs excised 11/28/21   Basal cell carcinoma 10/04/2021   L antecubital excised 11/14/21   Diarrhea 11/12/2016   Fibromyalgia    Generalized abdominal pain 11/12/2016   H. pylori infection    Hyperlipidemia    IBS (irritable bowel syndrome)    Infectious colitis 04/29/2016   Migraines    Moderate dehydration 04/29/2016   Muscle pain    Opioid  overdose (HCC) 12/07/2022   Reflux    Seizures (HCC)    Unexplained weight loss 11/12/2016   Past Surgical History:  Procedure Laterality Date   ABDOMINAL HYSTERECTOMY     APPENDECTOMY  2009   C5 FUSION     C6 FUSION     C7 FUSION     COLONOSCOPY  02/2006   COLONOSCOPY WITH PROPOFOL  N/A 12/25/2016   Procedure: COLONOSCOPY WITH PROPOFOL ;  Surgeon: Dessa Reyes ORN, MD;  Location: [facility] ENDOSCOPY;  Service: Endoscopy;  Laterality: N/A;   ESOPHAGOGASTRODUODENOSCOPY (EGD) WITH PROPOFOL  N/A 12/25/2016   Procedure: ESOPHAGOGASTRODUODENOSCOPY (EGD) WITH PROPOFOL ;  Surgeon: Dessa Reyes ORN, MD;  Location: [facility] ENDOSCOPY;  Service: Endoscopy;  Laterality: N/A;   FOOT SURGERY     HIP ARTHROPLASTY     L4 FUSION     L5 FUSION     S1 FUSION     Social History:  reports that she has been smoking cigarettes. She has been exposed to tobacco smoke. She has never used smokeless tobacco. She reports that she does not currently use alcohol. She reports that she does not currently use drugs after having used the following drugs: Marijuana.  Allergies[1]  Family History  Problem Relation Age of Onset   Diabetes Father    Hypertension Father    Mental illness Neg Hx     Prior to Admission medications  Medication Sig Start Date End Date Taking? Authorizing Provider  acetaminophen  (TYLENOL ) 500 MG tablet Take 1,000 mg by mouth every 6 (six) hours as needed for moderate pain (pain score  4-6).   Yes [provider]  albuterol  (VENTOLIN  HFA) 108 (90 Base) MCG/ACT inhaler Inhale 2 puffs into the lungs. Take two (2) puffs by mouth every 4 to 6 hours as needed for wheezing or shortness of breath   Yes [provider]  bisacodyl  (DULCOLAX) 5 MG EC tablet Take 1 tablet (5 mg total) by mouth daily as needed for moderate constipation. 10/04/23  Yes Maree Hue, MD  carbamazepine  (TEGRETOL  XR) 100 MG 12 hr tablet Take 1 tablet (100 mg total) by mouth 2 (two) times daily. 01/02/24 05/20/25 Yes Wouk,  Devaughn Sayres, MD  Cholecalciferol  (VITAMIN D -3 PO) Take 1 capsule by mouth every morning.   Yes [provider]  doxycycline  (VIBRAMYCIN ) 100 MG capsule Take 1 capsule (100 mg total) by mouth 2 (two) times daily for 7 days. 08/01/24 08/08/24 Yes Bernardino Ditch, NP  DULoxetine  (CYMBALTA ) 60 MG capsule TAKE 1 CAPSULE BY MOUTH EVERY DAY 07/30/24  Yes [name], Susan K, MD  folic acid  (FOLVITE ) 1 MG tablet Take 1 tablet (1 mg total) by mouth daily. 07/06/24 04/02/25 Yes [name], Susan K, MD  gabapentin  (NEURONTIN ) 300 MG capsule Take 1 capsule (300 mg total) by mouth 4 (four) times daily. 10/04/23 05/20/25 Yes Maree Hue, MD  lamoTRIgine  (LAMICTAL ) 25 MG tablet Take 2 tablets (50 mg total) by mouth 2 (two) times daily. 07/06/24  Yes [name], Susan K, MD  levETIRAcetam  (KEPPRA ) 750 MG tablet Take 2 tablets (1,500 mg total) by mouth 2 (two) times daily. 01/02/24  Yes Wouk, Devaughn Sayres, MD  Melatonin 10 MG TABS Take 10 mg by mouth at bedtime.   Yes [provider]  midodrine  (PROAMATINE ) 5 MG tablet Take 5 mg by mouth 3 (three) times daily with meals. Patient taking differently: Take 5 mg by mouth 3 (three) times daily as needed.   Yes [provider]  mirtazapine  (REMERON ) 15 MG tablet Take 1 tablet (15 mg total) by mouth at bedtime. No refills without a follow-up appointment. 07/28/24 08/27/24 Yes Eappen, Saramma, MD  ondansetron  (ZOFRAN ) 4 MG tablet Take 1 tablet (4 mg total) by mouth every 8 (eight) hours as needed for nausea or vomiting. 07/20/24  Yes [name], Susan K, MD  Oxycodone  HCl 10 MG TABS Take 10 mg by mouth every 4 (four) hours as needed. 06/18/24  Yes [provider]  Pancrelipase , Lip-Prot-Amyl, 24000-76000 units CPEP Take 1 capsule (24,000 Units total) by mouth 3 (three) times daily with meals. 07/06/24  Yes [name], Susan K, MD  pantoprazole  (PROTONIX ) 40 MG tablet Take 1 tablet (40 mg total) by mouth daily. 03/11/24  Yes [name], Susan K, MD  potassium chloride   (KLOR-CON  M) 10 MEQ tablet Take 1 tablet (10 mEq total) by mouth 2 (two) times daily. 07/06/24  Yes [name], Susan K, MD  torsemide  (DEMADEX ) 20 MG tablet Take 1 tablet (20 mg total) by mouth 2 (two) times daily. 07/06/24  Yes [name], Susan K, MD  AMBULATORY NON FORMULARY MEDICATION Knee-high, medium compression, graduated compression stockings. Apply to lower extremities. [email or web address], Zippered Compression Stockings, medium circ, long length 03/04/24   [name], Susan K, MD  collagenase  (SANTYL ) 250 UNIT/GM ointment Apply 1 Application topically daily. Patient not taking: Reported on 06/29/2024 03/26/24   [name], Susan K, MD  feeding supplement (ENSURE PLUS HIGH PROTEIN) LIQD Take 237 mLs by mouth 3 (three) times daily between meals. Patient taking differently: Take 237 mLs by mouth 3 (three) times daily between meals. boost 04/06/24   Von Bellis, MD  NON  FORMULARY Apply 1 Application topically 3 (three) times daily as needed. Rock Salt Cream (Similar to [employer])    [provider]    Physical Exam: Vitals:   08/06/24 0010 08/06/24 0030 08/06/24 0100 08/06/24 0148  BP:  (!) 105/51 93/78 (!) 125/54  Pulse:  70 64   Resp:  18    Temp:      TempSrc:      SpO2: 92% 97% 99%   Weight:       Physical Exam Vitals and nursing note reviewed.  Constitutional:      General: She is not in acute distress. HENT:     Head: Normocephalic and atraumatic.  Cardiovascular:     Rate and Rhythm: Normal rate and regular rhythm.     Heart sounds: Normal heart sounds.  Pulmonary:     Effort: Pulmonary effort is normal.     Breath sounds: Normal breath sounds.  Abdominal:     Palpations: Abdomen is soft.     Tenderness: There is no abdominal tenderness.  Musculoskeletal:     Comments: Right foot in boot  Neurological:     Mental Status: Mental status is at baseline.     Labs on Admission: I have personally reviewed following labs and imaging studies  CBC: Recent Labs  Lab  08/05/24 2046  WBC 8.0  NEUTROABS 4.5  HGB 12.9  HCT 41.2  MCV 88.2  PLT 258   Basic Metabolic Panel: Recent Labs  Lab 08/05/24 2046  NA 138  K 3.7  CL 94*  CO2 33*  GLUCOSE 105*  BUN 19  CREATININE 0.76  CALCIUM  9.6   GFR: Estimated Creatinine Clearance: 74.5 mL/min (by C-G formula based on SCr of 0.76 mg/dL). Liver Function Tests: Recent Labs  Lab 08/05/24 2046  AST 26  ALT 15  ALKPHOS 156*  BILITOT 0.2  PROT 8.0  ALBUMIN  4.3   No results for input(s): LIPASE, AMYLASE in the last 168 hours. No results for input(s): AMMONIA in the last 168 hours. Coagulation Profile: No results for input(s): INR, PROTIME in the last 168 hours. Cardiac Enzymes: No results for input(s): CKTOTAL, CKMB, CKMBINDEX, TROPONINI in the last 168 hours. BNP (last 3 results) No results for input(s): PROBNP in the last 8760 hours. HbA1C: No results for input(s): HGBA1C in the last 72 hours. CBG: No results for input(s): GLUCAP in the last 168 hours. Lipid Profile: No results for input(s): CHOL, HDL, LDLCALC, TRIG, CHOLHDL, LDLDIRECT in the last 72 hours. Thyroid  Function Tests: No results for input(s): TSH, T4TOTAL, FREET4, T3FREE, THYROIDAB in the last 72 hours. Anemia Panel: No results for input(s): VITAMINB12, FOLATE, FERRITIN, TIBC, IRON , RETICCTPCT in the last 72 hours. Urine analysis:    Component Value Date/Time   COLORURINE STRAW (A) 06/09/2024 1500   APPEARANCEUR CLEAR (A) 06/09/2024 1500   APPEARANCEUR Clear 05/24/2014 1903   LABSPEC 1.003 (L) 06/09/2024 1500   LABSPEC 1.015 05/24/2014 1903   PHURINE 8.0 06/09/2024 1500   GLUCOSEU NEGATIVE 06/09/2024 1500   GLUCOSEU Negative 05/24/2014 1903   HGBUR NEGATIVE 06/09/2024 1500   BILIRUBINUR NEGATIVE 06/09/2024 1500   BILIRUBINUR Negative 05/24/2014 1903   KETONESUR NEGATIVE 06/09/2024 1500   PROTEINUR NEGATIVE 06/09/2024 1500   NITRITE NEGATIVE 06/09/2024 1500    LEUKOCYTESUR NEGATIVE 06/09/2024 1500   LEUKOCYTESUR Negative 05/24/2014 1903    Radiological Exams on Admission: No results found. Data Reviewed for HPI: Relevant notes from primary care and specialist visits, past discharge summaries as available in EHR,  including Care Everywhere. Prior diagnostic testing as pertinent to current admission diagnoses Updated medications and problem lists for reconciliation ED course, including vitals, labs, imaging, treatment and response to treatment Triage notes, nursing and pharmacy notes and ED provider's notes Notable results as noted above in HPI      Assessment and Plan: * Cellulitis and abscess of toe of right foot Multiple antibiotic allergies Levaquin  and vancomycin  (allergic to cephalosporins and sulfa) Pain control Keep leg elevated Follow-up MRI of the foot Keeping n.p.o. Holding Eliquis  in case of procedure Podiatry consult  Chronic hypotension BP 83/60, improved with midodrine  and IV fluids Sepsis not suspected at this time Midodrine  as needed Continue to monitor  History of pulmonary embolus (PE) Will hold Eliquis  in case of procedure  Venous stasis chronic, with ulcers (HCC)  Hypoalbuminemia Keep legs elevated Had a normal echo 03/2024  Seizure disorder (HCC) Continue Tegretol , gabapentin , lamotrigine  and Keppra   Depression with anxiety Continue duloxetine  and mirtazapine   Chronic respiratory failure with hypoxia (HCC) Asthma Continue as needed inhalers  Pancreatic pseudocyst No acute issues suspected  Chronic prescription opiate use - thru [facility] in [place], [place] Chronic pain History of polypharmacy related acute encephalopathy Continue gabapentin  and duloxetine  on oxycodone  Neurologic checks     DVT prophylaxis: SCD  Consults: TFA podiatry  Advance Care Planning:   Code Status: Full Code   Family Communication: none  Disposition Plan: Back to previous home environment  Severity of  Illness: The appropriate patient status for this patient is OBSERVATION. Observation status is judged to be reasonable and necessary in order to provide the required intensity of service to ensure the patient's safety. The patient's presenting symptoms, physical exam findings, and initial radiographic and laboratory data in the context of their medical condition is felt to place them at decreased risk for further clinical deterioration. Furthermore, it is anticipated that the patient will be medically stable for discharge from the hospital within 2 midnights of admission.   Author: Delayne LULLA Solian, MD 08/06/2024 2:28 AM  For on call review [email or web address].      [1]  Allergies Allergen Reactions   Nsaids Hives   Tapentadol Swelling, Rash and Other (See Comments)    Nucynta- Made her deathly sick   Cephalexin  Other (See Comments)    Reaction not cited   Codeine Other (See Comments)    Reaction not cited   Darvocet [Propoxyphene N-Acetaminophen ] Other (See Comments)    Reaction not cited   Latex Other (See Comments)    Reaction not cited   Silicone Other (See Comments)    Reaction not cited   Sulfa Antibiotics Hives   Tape Other (See Comments)    Reaction not cited   Meloxicam Rash

## 2024-08-06 NOTE — Assessment & Plan Note (Signed)
 Continue duloxetine and mirtazapine

## 2024-08-06 NOTE — ED Provider Notes (Signed)
 1:54 AM  Assumed care at shift change.  Patient here with concerns for cellulitis, osteomyelitis.  MRI of the foot pending.  No leukocytosis, leukopenia and normal lactic but was hypotensive.  Told nursing staff that this was chronic but on review of records it looks like her most recent blood pressure was in the 120s/70s.  She is receiving IV fluids and midodrine  and blood pressure has improved.  She is not requiring vasopressors at this time.  Discussed the case with Dr. Cleatus with hospitalist service who agrees to admit.   Zoeie Ritter, Josette SAILOR, DO 08/06/24 [phone]

## 2024-08-06 NOTE — Assessment & Plan Note (Addendum)
 Asthma Continue as needed inhalers

## 2024-08-06 NOTE — Progress Notes (Addendum)
 Pharmacy Antibiotic Note  Abigail [name] is a 58 y.o. female admitted on 08/05/2024 with cellulitis.  Pharmacy has been consulted for Vancomycin  , Levaquin  dosing.  Plan: Levaquin  750 mg IV X 1 given in ED on 12/17 @ 2319. Levaquin  750 mg IV Q24H ordered to start on 12/18 @ 2300.  Vancomycin  1 gm IV X 1 given in ED on 12/18 @ 0059.  Additional Vanc 750 mg IV X 1 ordered to make total loading dose of 1750 mg.   Vancomycin  1500 mg IV Q24H ordered to start on 12/19 @ 0100.  AUC = 486.2 Vanc trough = 9.8   Weight: 70.3 kg (155 lb)  Temp (24hrs), Avg:98.6 F (37 C), Min:98.3 F (36.8 C), Max:98.9 F (37.2 C)  Recent Labs  Lab 08/05/24 2046  WBC 8.0  CREATININE 0.76  LATICACIDVEN 0.8    Estimated Creatinine Clearance: 74.5 mL/min (by C-G formula based on SCr of 0.76 mg/dL).    Allergies[1]  Antimicrobials this admission:   >>    >>   Dose adjustments this admission:   Microbiology results:  BCx:   UCx:    Sputum:    MRSA PCR:   Thank you for allowing pharmacy to be a part of this patients care.  Abigail [name] D 08/06/2024 2:49 AM     [1]  Allergies Allergen Reactions   Nsaids Hives   Tapentadol Swelling, Rash and Other (See Comments)    Nucynta- Made her deathly sick   Cephalexin  Other (See Comments)    Reaction not cited   Codeine Other (See Comments)    Reaction not cited   Darvocet [Propoxyphene N-Acetaminophen ] Other (See Comments)    Reaction not cited   Latex Other (See Comments)    Reaction not cited   Silicone Other (See Comments)    Reaction not cited   Sulfa Antibiotics Hives   Tape Other (See Comments)    Reaction not cited   Meloxicam Rash

## 2024-08-07 ENCOUNTER — Encounter: Payer: Self-pay | Admitting: Podiatry

## 2024-08-07 ENCOUNTER — Ambulatory Visit: Payer: Self-pay

## 2024-08-07 NOTE — Telephone Encounter (Signed)
" °  FYI Only or Action Required?: Action required by provider: clinical question for provider.  Patient was last seen in primary care on 07/20/2024 by [name], Susan K, MD.  Called Nurse Triage reporting Referral.   Triage Disposition: Call PCP Now  Patient/caregiver understands and will follow disposition?: Yes  Copied from CRM [id number]. Topic: Clinical - Red Word Triage >> Aug 07, 2024  3:19 PM Ivette P wrote: Red Word that prompted transfer to Nurse Triage: second toe on right foot amputated. let her go home. had home health care. beacue had home health care. Nurse said she didnt have orders to come see her. Nurse Needs new orders to have Nurse from [facility] home Health.    Pt is in pain and would like orders sent as soon as possible Reason for Disposition  [1] Caller requests to speak ONLY to PCP AND [2] URGENT question  Answer Assessment - Initial Assessment Questions 1. REASON FOR CALL or QUESTION: What is your reason for calling today? or How can I best     Pt states she is requesting orders for home health care so her home care nurse can resume.  Routing to clinic to assist with patient's request for PCP to submit orders.  Protocols used: PCP Call - No Triage-A-AH  "

## 2024-08-10 LAB — CULTURE, BLOOD (ROUTINE X 2)
Culture: NO GROWTH
Culture: NO GROWTH
Special Requests: ADEQUATE

## 2024-08-10 LAB — SURGICAL PATHOLOGY

## 2024-08-11 ENCOUNTER — Ambulatory Visit: Admitting: Podiatry

## 2024-08-11 ENCOUNTER — Telehealth: Payer: Self-pay | Admitting: Family Medicine

## 2024-08-11 DIAGNOSIS — Z89421 Acquired absence of other right toe(s): Secondary | ICD-10-CM

## 2024-08-11 LAB — AEROBIC/ANAEROBIC CULTURE W GRAM STAIN (SURGICAL/DEEP WOUND)
Culture: NO GROWTH
Gram Stain: NONE SEEN

## 2024-08-11 NOTE — Progress Notes (Signed)
 "  Subjective:  Patient ID: [name], female    DOB: [birth year],  MRN: [id number]  Chief Complaint  Patient presents with   Routine Post Op    DOS 08/05/24 right 2nd toe amp     DOS: 08/05/2024 Procedure: Right second digit amputation  58 y.o. female returns for post-op check.  She states she is doing well pain is controlled denies any other acute complaints bandages clean dry and intact  Review of Systems: Negative except as noted in the HPI. Denies N/V/F/Ch.  Past Medical History:  Diagnosis Date   Anxiety    Arthritis    Back pain    Basal cell carcinoma 10/04/2021   R axilla - needs excised 11/28/21   Basal cell carcinoma 10/04/2021   L antecubital excised 11/14/21   Diarrhea 11/12/2016   Fibromyalgia    Generalized abdominal pain 11/12/2016   H. pylori infection    Hyperlipidemia    IBS (irritable bowel syndrome)    Infectious colitis 04/29/2016   Migraines    Moderate dehydration 04/29/2016   Muscle pain    Opioid overdose (HCC) 12/07/2022   Reflux    Seizures (HCC)    Unexplained weight loss 11/12/2016   Current Medications[1]  Tobacco Use History[2]  Allergies[3] Objective:  There were no vitals filed for this visit. There is no height or weight on file to calculate BMI. Constitutional Well developed. Well nourished.  Vascular Foot warm and well perfused. Capillary refill normal to all digits.   Neurologic Normal speech. Oriented to person, place, and time. Epicritic sensation to light touch grossly present bilaterally.  Dermatologic Skin healing well without signs of infection. Skin edges well coapted without signs of infection.  Orthopedic: Tenderness to palpation noted about the surgical site.   Radiographs: None Assessment:  No diagnosis found. Plan:  Patient was evaluated and treated and all questions answered.  S/p foot surgery right -Progressing as expected post-operatively. -XR: See above -WB Status: Weightbearing as  tolerated in surgical shoe -Sutures: In intact.  No clinical signs of dehiscence no complication noted -Medications: None -Foot redressed.  No follow-ups on file.     [1]  Current Outpatient Medications:    acetaminophen  (TYLENOL ) 500 MG tablet, Take 1,000 mg by mouth every 6 (six) hours as needed for moderate pain (pain score 4-6)., Disp: , Rfl:    albuterol  (VENTOLIN  HFA) 108 (90 Base) MCG/ACT inhaler, Inhale 2 puffs into the lungs every 6 (six) hours as needed for wheezing or shortness of breath. Take two (2) puffs by mouth every 4 to 6 hours as needed for wheezing or shortness of breath, Disp: 18 g, Rfl: 0   AMBULATORY NON FORMULARY MEDICATION, Knee-high, medium compression, graduated compression stockings. Apply to lower extremities. [email or web address], Zippered Compression Stockings, medium circ, long length, Disp: 1 each, Rfl: 0   bisacodyl  (DULCOLAX) 5 MG EC tablet, Take 1 tablet (5 mg total) by mouth daily as needed for moderate constipation., Disp: 30 tablet, Rfl: 0   carbamazepine  (TEGRETOL  XR) 100 MG 12 hr tablet, Take 1 tablet (100 mg total) by mouth 2 (two) times daily., Disp: 60 tablet, Rfl: 2   Cholecalciferol  (VITAMIN D -3 PO), Take 1 capsule by mouth every morning., Disp: , Rfl:    doxycycline  (VIBRA -TABS) 100 MG tablet, Take 1 tablet (100 mg total) by mouth every 12 (twelve) hours for 7 days., Disp: 14 tablet, Rfl: 0   DULoxetine  (CYMBALTA ) 60 MG capsule, TAKE 1 CAPSULE BY MOUTH EVERY DAY, Disp: 30 capsule,  Rfl: 3   feeding supplement (ENSURE PLUS HIGH PROTEIN) LIQD, Take 237 mLs by mouth 3 (three) times daily between meals. (Patient taking differently: Take 237 mLs by mouth 3 (three) times daily between meals. boost), Disp: , Rfl:    folic acid  (FOLVITE ) 1 MG tablet, Take 1 tablet (1 mg total) by mouth daily., Disp: 90 tablet, Rfl: 2   gabapentin  (NEURONTIN ) 300 MG capsule, Take 1 capsule (300 mg total) by mouth 4 (four) times daily., Disp: 120 capsule, Rfl: 0   lamoTRIgine   (LAMICTAL ) 25 MG tablet, Take 2 tablets (50 mg total) by mouth 2 (two) times daily., Disp: 360 tablet, Rfl: 1   levETIRAcetam  (KEPPRA ) 750 MG tablet, Take 2 tablets (1,500 mg total) by mouth 2 (two) times daily., Disp: 120 tablet, Rfl: 1   Melatonin 10 MG TABS, Take 10 mg by mouth at bedtime., Disp: , Rfl:    midodrine  (PROAMATINE ) 5 MG tablet, Take 5 mg by mouth 3 (three) times daily with meals. (Patient taking differently: Take 5 mg by mouth 3 (three) times daily as needed.), Disp: , Rfl:    mirtazapine  (REMERON ) 15 MG tablet, Take 1 tablet (15 mg total) by mouth at bedtime. No refills without a follow-up appointment., Disp: 30 tablet, Rfl: 0   ondansetron  (ZOFRAN ) 4 MG tablet, Take 1 tablet (4 mg total) by mouth every 8 (eight) hours as needed for nausea or vomiting., Disp: 20 tablet, Rfl: 0   Oxycodone  HCl 10 MG TABS, Take 10 mg by mouth every 4 (four) hours as needed (Pain)., Disp: , Rfl:    Pancrelipase , Lip-Prot-Amyl, 24000-76000 units CPEP, Take 1 capsule (24,000 Units total) by mouth 3 (three) times daily with meals., Disp: 300 capsule, Rfl: 0   pantoprazole  (PROTONIX ) 40 MG tablet, Take 1 tablet (40 mg total) by mouth daily., Disp: 90 tablet, Rfl: 1   potassium chloride  (KLOR-CON  M) 10 MEQ tablet, Take 1 tablet (10 mEq total) by mouth 2 (two) times daily., Disp: 180 tablet, Rfl: 1   torsemide  (DEMADEX ) 20 MG tablet, Take 1 tablet (20 mg total) by mouth 2 (two) times daily., Disp: 180 tablet, Rfl: 3 [2]  Social History Tobacco Use  Smoking Status Every Day   Current packs/day: 1.00   Types: Cigarettes   Passive exposure: Past  Smokeless Tobacco Never  Tobacco Comments   ELECTRONIC VAPOR  [3]  Allergies Allergen Reactions   Nsaids Hives   Tapentadol Swelling, Rash and Other (See Comments)    Nucynta- Made her deathly sick   Cephalexin  Other (See Comments)    Reaction not cited. Has tolerated Ceftriaxone  and Unasyn     Codeine Other (See Comments)    Reaction not cited   Darvocet  [Propoxyphene N-Acetaminophen ] Other (See Comments)    Reaction not cited   Latex Other (See Comments)    Reaction not cited   Silicone Other (See Comments)    Reaction not cited   Sulfa Antibiotics Hives   Tape Other (See Comments)    Reaction not cited   Meloxicam Rash   "

## 2024-08-11 NOTE — Telephone Encounter (Signed)
 Copied from CRM [id number]. Topic: General - Other >> Aug 11, 2024  8:05 AM Olam RAMAN wrote: Reason for CRM: let pt know SN saw pt yesterday and will her twice a week for 5 weeks and then once every other week until 10/08/24. Adding a home health aid once a week and alsot to be eval for OT

## 2024-08-18 ENCOUNTER — Telehealth: Payer: Self-pay | Admitting: *Deleted

## 2024-08-18 ENCOUNTER — Other Ambulatory Visit: Payer: Self-pay | Admitting: Family Medicine

## 2024-08-18 ENCOUNTER — Encounter: Payer: Self-pay | Admitting: *Deleted

## 2024-08-18 DIAGNOSIS — K8681 Exocrine pancreatic insufficiency: Secondary | ICD-10-CM

## 2024-08-18 NOTE — Patient Instructions (Signed)
 [facility] - I am sorry I was unable to reach you today for our scheduled appointment. I work with [name], Susan K, MD and am calling to support your healthcare needs. Please contact me at [phone] at your earliest convenience. I look forward to speaking with you soon.   Thank you,   Vanessia Bokhari, LCSW [place]  [facility], [facility] Licensed Clinical Social Worker  Direct Dial: [phone]

## 2024-08-19 ENCOUNTER — Other Ambulatory Visit: Payer: Self-pay | Admitting: Pharmacist

## 2024-08-21 ENCOUNTER — Other Ambulatory Visit: Payer: Self-pay

## 2024-08-21 NOTE — Patient Outreach (Signed)
 Complex Care Management   Visit Note  08/21/2024  Name:  Abigail [name] [facility] MRN: [id number] DOB: 29-Oct-1965  Situation: Referral received for Complex Care Management related to Chronic Respiratory Failure/recent hospitalization 10/21-05/2424 for acute/chronic CHF. I obtained verbal consent from Patient.  Visit completed with Abigail [name]  on the phone.  Patient had 2nd toe on right foot amputation on 08/05/24, has had f/u with Podiatry, visiting nurse performing dressing changes. Medication concern with Creon , states she has no more refills.   Background:   Past Medical History:  Diagnosis Date   Anxiety    Arthritis    Back pain    Basal cell carcinoma 10/04/2021   R axilla - needs excised 11/28/21   Basal cell carcinoma 10/04/2021   L antecubital excised 11/14/21   Diarrhea 11/12/2016   Fibromyalgia    Generalized abdominal pain 11/12/2016   H. pylori infection    Hyperlipidemia    IBS (irritable bowel syndrome)    Infectious colitis 04/29/2016   Migraines    Moderate dehydration 04/29/2016   Muscle pain    Opioid overdose (HCC) 12/07/2022   Reflux    Seizures (HCC)    Unexplained weight loss 11/12/2016    Assessment: Patient Reported Symptoms:  Cognitive Cognitive Status: Alert and oriented to person, place, and time, Normal speech and language skills, Requires Assistance Decision Making      Neurological Neurological Review of Symptoms: Not assessed    HEENT HEENT Symptoms Reported: Not assessed      Cardiovascular Cardiovascular Symptoms Reported: Swelling in legs or feet Other Cardiovascular Symptoms: No longer ordered to wear UNNA boots, now wears compression stockings but currently only on left leg due to bandage on right foot for 2nd toe amputation. Cardiovascular Comment: Reports she is weighing daily but wasn't at home and couldn't provide today's weight.  Respiratory Respiratory Symptoms Reported: No symptoms reported    Endocrine Endocrine Symptoms  Reported: Not assessed Is patient diabetic?: No    Gastrointestinal Gastrointestinal Symptoms Reported: Not assessed      Genitourinary Genitourinary Symptoms Reported: Not assessed    Integumentary Integumentary Symptoms Reported: Incision Additional Integumentary Details: 2nd toe amputation on right foot, dressing change by visiting nurse.    Musculoskeletal Musculoskelatal Symptoms Reviewed: Other Other Musculoskeletal Symptoms: Neuropathy in both feet        Psychosocial Psychosocial Symptoms Reported: Not assessed          08/21/2024    PHQ2-9 Depression Screening   Little interest or pleasure in doing things    Feeling down, depressed, or hopeless    PHQ-2 - Total Score    Trouble falling or staying asleep, or sleeping too much    Feeling tired or having little energy    Poor appetite or overeating     Feeling bad about yourself - or that you are a failure or have let yourself or your family down    Trouble concentrating on things, such as reading the newspaper or watching television    Moving or speaking so slowly that other people could have noticed.  Or the opposite - being so fidgety or restless that you have been moving around a lot more than usual    Thoughts that you would be better off dead, or hurting yourself in some way    PHQ2-9 Total Score    If you checked off any problems, how difficult have these problems made it for you to do your work, take care of things at home, or  get along with other people    Depression Interventions/Treatment      There were no vitals filed for this visit.    MEDICATION: Reports she needs refills on Creon .    Recommendation:   PCP follow up: 08/24/2024 Specialty provider follow-up : Vascular 08/26/24; Podiatry 08/27/24; LCSW 09/01/24; Pharmacy 09/02/24: Neurology 09/08/24 Patient is aware to report signs of infection to incision on right 2nd toe to Podiatry.   Follow Up Plan:   Telephone follow-up in 1 week  Santana Stamp BSN,  CCM [place]  [facility] Population Health RN Care Manager Direct Dial: [phone]  Fax: [phone]

## 2024-08-24 ENCOUNTER — Ambulatory Visit: Admitting: Family Medicine

## 2024-08-24 ENCOUNTER — Other Ambulatory Visit: Payer: Self-pay | Admitting: Family Medicine

## 2024-08-24 DIAGNOSIS — F411 Generalized anxiety disorder: Secondary | ICD-10-CM

## 2024-08-24 DIAGNOSIS — F321 Major depressive disorder, single episode, moderate: Secondary | ICD-10-CM

## 2024-08-24 DIAGNOSIS — F322 Major depressive disorder, single episode, severe without psychotic features: Secondary | ICD-10-CM

## 2024-08-24 DIAGNOSIS — G4701 Insomnia due to medical condition: Secondary | ICD-10-CM

## 2024-08-24 MED ORDER — MIRTAZAPINE 15 MG PO TABS: 15.0000 mg | ORAL_TABLET | Freq: Every day | ORAL | 0 refills | Status: AC

## 2024-08-24 NOTE — Patient Outreach (Deleted)
 Complex Care Management   Visit Note  08/24/2024  Name:  Abigail [name] MRN: [id number] DOB: 1965/12/19  Situation: Referral received for Complex Care Management related to Heart Failure I obtained verbal consent from Patient.  Visit completed with Abigail [name]  on the phone. Hospitalization 12/17- 08/06/24 Amputation of right 2nd toe at MPJ level.    Background:   Past Medical History:  Diagnosis Date   Anxiety    Arthritis    Back pain    Basal cell carcinoma 10/04/2021   R axilla - needs excised 11/28/21   Basal cell carcinoma 10/04/2021   L antecubital excised 11/14/21   Diarrhea 11/12/2016   Fibromyalgia    Generalized abdominal pain 11/12/2016   H. pylori infection    Hyperlipidemia    IBS (irritable bowel syndrome)    Infectious colitis 04/29/2016   Migraines    Moderate dehydration 04/29/2016   Muscle pain    Opioid overdose (HCC) 12/07/2022   Reflux    Seizures (HCC)    Unexplained weight loss 11/12/2016    Assessment: Patient Reported Symptoms:  Cognitive Cognitive Status: Alert and oriented to person, place, and time, Normal speech and language skills, Requires Assistance Decision Making      Neurological Neurological Review of Symptoms: Not assessed    HEENT HEENT Symptoms Reported: Not assessed      Cardiovascular Cardiovascular Symptoms Reported: Swelling in legs or feet Other Cardiovascular Symptoms: No longer ordered to wear UNNA boots, now wears compression stockings but currently only on left leg due to bandage on right foot for 2nd toe amputation. Cardiovascular Comment: Reports she is weighing daily but wasn't at home and couldn't provide today's weight.  Respiratory Respiratory Symptoms Reported: No symptoms reported    Endocrine Endocrine Symptoms Reported: Not assessed Is patient diabetic?: No    Gastrointestinal Gastrointestinal Symptoms Reported: Not assessed      Genitourinary Genitourinary Symptoms Reported: Not assessed     Integumentary Integumentary Symptoms Reported: Incision Additional Integumentary Details: 2nd toe amputation on right foot, dressing change by visiting nurse.    Musculoskeletal Musculoskelatal Symptoms Reviewed: Other Other Musculoskeletal Symptoms: Neuropathy in both feet        Psychosocial Psychosocial Symptoms Reported: Not assessed          08/24/2024    PHQ2-9 Depression Screening   Little interest or pleasure in doing things    Feeling down, depressed, or hopeless    PHQ-2 - Total Score    Trouble falling or staying asleep, or sleeping too much    Feeling tired or having little energy    Poor appetite or overeating     Feeling bad about yourself - or that you are a failure or have let yourself or your family down    Trouble concentrating on things, such as reading the newspaper or watching television    Moving or speaking so slowly that other people could have noticed.  Or the opposite - being so fidgety or restless that you have been moving around a lot more than usual    Thoughts that you would be better off dead, or hurting yourself in some way    PHQ2-9 Total Score    If you checked off any problems, how difficult have these problems made it for you to do your work, take care of things at home, or get along with other people    Depression Interventions/Treatment      There were no vitals filed for this visit.  MEDICATIONS: Patient states she needs refill for Creon , chart review shows a note for refill on 08/18/24.    Recommendation:   Patient is aware to report symptoms of infection to incision on right 2nd toe to Podiatry.  PCP follow up: 08/24/2024 Specialty provider follow-up : Vascular 08/26/24; Podiatry 08/27/24; LCSW 09/01/24; Pharmacist 09/02/24; Neurology 09/08/24  Follow Up Plan:   Telephone follow-up in 1 week  Santana Stamp BSN, CCM [place]  [facility] Health RN Care Manager Direct Dial: [phone]  Fax: [phone]

## 2024-08-24 NOTE — Patient Instructions (Signed)
 Visit Information  Thank you for taking time to visit with me today. Please don't hesitate to contact me if I can be of assistance to you before our next scheduled appointment.  Your next care management appointment is by telephone on Friday, January 9th at 2pm.   Please call the care guide team at [phone] if you need to cancel, schedule, or reschedule an appointment.   Please call the [place]  National Suicide Prevention Lifeline: 9035994298 or TTY: 979-096-2797 TTY (726)590-9286) to talk to a trained counselor if you are experiencing a Mental Health or Behavioral Health Crisis or need someone to talk to.  Santana Stamp BSN, CCM [place]  [facility] Population Health RN Care Manager Direct Dial: [phone]  Fax: [phone]

## 2024-08-24 NOTE — Telephone Encounter (Signed)
 I have sent mirtazapine  for 30-day supply to pharmacy.

## 2024-08-24 NOTE — Telephone Encounter (Signed)
 Patient has been noncompliant with appointments.  No refills without scheduling an appointment.

## 2024-08-26 ENCOUNTER — Ambulatory Visit (INDEPENDENT_AMBULATORY_CARE_PROVIDER_SITE_OTHER): Admitting: Vascular Surgery

## 2024-08-27 ENCOUNTER — Ambulatory Visit (INDEPENDENT_AMBULATORY_CARE_PROVIDER_SITE_OTHER): Admitting: Podiatry

## 2024-08-27 DIAGNOSIS — Z89421 Acquired absence of other right toe(s): Secondary | ICD-10-CM

## 2024-08-27 NOTE — Progress Notes (Signed)
 "  Subjective:  Patient ID: Abigail [name] [facility], female    DOB: 09-Oct-1965,  MRN: [id number]  Chief Complaint  Patient presents with   Routine Post Op    DOS: 08/05/2024 Procedure: Right second digit amputation  59 y.o. female returns for post-op check.  She states she is doing well pain is controlled denies any other acute complaints bandages clean dry and intact  Review of Systems: Negative except as noted in the HPI. Denies N/V/F/Ch.  Past Medical History:  Diagnosis Date   Anxiety    Arthritis    Back pain    Basal cell carcinoma 10/04/2021   R axilla - needs excised 11/28/21   Basal cell carcinoma 10/04/2021   L antecubital excised 11/14/21   Diarrhea 11/12/2016   Fibromyalgia    Generalized abdominal pain 11/12/2016   H. pylori infection    Hyperlipidemia    IBS (irritable bowel syndrome)    Infectious colitis 04/29/2016   Migraines    Moderate dehydration 04/29/2016   Muscle pain    Opioid overdose (HCC) 12/07/2022   Reflux    Seizures (HCC)    Unexplained weight loss 11/12/2016   Current Medications[1]  Tobacco Use History[2]  Allergies[3] Objective:  There were no vitals filed for this visit. There is no height or weight on file to calculate BMI. Constitutional Well developed. Well nourished.  Vascular Foot warm and well perfused. Capillary refill normal to all digits.   Neurologic Normal speech. Oriented to person, place, and time. Epicritic sensation to light touch grossly present bilaterally.  Dermatologic Skin completely reepithelialized.  No further signs of dehiscence noted.  Good correction of noted.  Orthopedic: No further tenderness to palpation noted about the surgical site.   Radiographs: None Assessment:   1. History of amputation of right second toe    Plan:  Patient was evaluated and treated and all questions answered.  S/p foot surgery right - Clinically healed official discharge from my care.  No signs of dehiscence noted  no further wounds noted.  Encouraged her to return to regular shoes and activities.  No follow-ups on file.      [1]  Current Outpatient Medications:    acetaminophen  (TYLENOL ) 500 MG tablet, Take 1,000 mg by mouth every 6 (six) hours as needed for moderate pain (pain score 4-6)., Disp: , Rfl:    albuterol  (VENTOLIN  HFA) 108 (90 Base) MCG/ACT inhaler, Inhale 2 puffs into the lungs every 6 (six) hours as needed for wheezing or shortness of breath. Take two (2) puffs by mouth every 4 to 6 hours as needed for wheezing or shortness of breath, Disp: 18 g, Rfl: 0   AMBULATORY NON FORMULARY MEDICATION, Knee-high, medium compression, graduated compression stockings. Apply to lower extremities. [email or web address], Zippered Compression Stockings, medium circ, long length, Disp: 1 each, Rfl: 0   bisacodyl  (DULCOLAX) 5 MG EC tablet, Take 1 tablet (5 mg total) by mouth daily as needed for moderate constipation., Disp: 30 tablet, Rfl: 0   carbamazepine  (TEGRETOL  XR) 100 MG 12 hr tablet, Take 1 tablet (100 mg total) by mouth 2 (two) times daily., Disp: 60 tablet, Rfl: 2   Cholecalciferol  (VITAMIN D -3 PO), Take 1 capsule by mouth every morning., Disp: , Rfl:    CREON  24000-76000 units CPEP, TAKE 1 CAPSULE (24,000 UNITS TOTAL) BY MOUTH 3 (THREE) TIMES DAILY WITH MEALS., Disp: 300 capsule, Rfl: 0   DULoxetine  (CYMBALTA ) 60 MG capsule, TAKE 1 CAPSULE BY MOUTH EVERY DAY, Disp: 90 capsule, Rfl: 2  feeding supplement (ENSURE PLUS HIGH PROTEIN) LIQD, Take 237 mLs by mouth 3 (three) times daily between meals. (Patient taking differently: Take 237 mLs by mouth 3 (three) times daily between meals. boost), Disp: , Rfl:    folic acid  (FOLVITE ) 1 MG tablet, Take 1 tablet (1 mg total) by mouth daily., Disp: 90 tablet, Rfl: 2   gabapentin  (NEURONTIN ) 300 MG capsule, Take 1 capsule (300 mg total) by mouth 4 (four) times daily., Disp: 120 capsule, Rfl: 0   lamoTRIgine  (LAMICTAL ) 25 MG tablet, Take 2 tablets (50 mg total) by  mouth 2 (two) times daily., Disp: 360 tablet, Rfl: 1   levETIRAcetam  (KEPPRA ) 750 MG tablet, Take 2 tablets (1,500 mg total) by mouth 2 (two) times daily., Disp: 120 tablet, Rfl: 1   Melatonin 10 MG TABS, Take 10 mg by mouth at bedtime., Disp: , Rfl:    midodrine  (PROAMATINE ) 5 MG tablet, Take 5 mg by mouth 3 (three) times daily with meals. (Patient taking differently: Take 5 mg by mouth 3 (three) times daily as needed.), Disp: , Rfl:    mirtazapine  (REMERON ) 15 MG tablet, Take 1 tablet (15 mg total) by mouth at bedtime. No refills without a follow-up appointment., Disp: 30 tablet, Rfl: 0   ondansetron  (ZOFRAN ) 4 MG tablet, Take 1 tablet (4 mg total) by mouth every 8 (eight) hours as needed for nausea or vomiting., Disp: 20 tablet, Rfl: 0   Oxycodone  HCl 10 MG TABS, Take 10 mg by mouth every 4 (four) hours as needed (Pain)., Disp: , Rfl:    pantoprazole  (PROTONIX ) 40 MG tablet, Take 1 tablet (40 mg total) by mouth daily., Disp: 90 tablet, Rfl: 1   potassium chloride  (KLOR-CON  M) 10 MEQ tablet, Take 1 tablet (10 mEq total) by mouth 2 (two) times daily., Disp: 180 tablet, Rfl: 1   torsemide  (DEMADEX ) 20 MG tablet, Take 1 tablet (20 mg total) by mouth 2 (two) times daily., Disp: 180 tablet, Rfl: 3 [2]  Social History Tobacco Use  Smoking Status Every Day   Current packs/day: 1.00   Types: Cigarettes   Passive exposure: Past  Smokeless Tobacco Never  Tobacco Comments   ELECTRONIC VAPOR  [3]  Allergies Allergen Reactions   Nsaids Hives   Tapentadol Swelling, Rash and Other (See Comments)    Nucynta- Made her deathly sick   Cephalexin  Other (See Comments)    Reaction not cited. Has tolerated Ceftriaxone  and Unasyn     Codeine Other (See Comments)    Reaction not cited   Darvocet [Propoxyphene N-Acetaminophen ] Other (See Comments)    Reaction not cited   Latex Other (See Comments)    Reaction not cited   Silicone Other (See Comments)    Reaction not cited   Sulfa Antibiotics Hives   Tape  Other (See Comments)    Reaction not cited   Meloxicam Rash   "

## 2024-08-28 ENCOUNTER — Telehealth: Payer: Self-pay

## 2024-09-01 ENCOUNTER — Other Ambulatory Visit: Payer: Self-pay | Admitting: *Deleted

## 2024-09-01 ENCOUNTER — Ambulatory Visit: Admitting: Family Medicine

## 2024-09-01 ENCOUNTER — Encounter: Payer: Self-pay | Admitting: Family Medicine

## 2024-09-01 VITALS — BP 112/73 | HR 80 | Temp 98.3°F | Resp 17 | Ht 67.0 in | Wt 168.6 lb

## 2024-09-01 DIAGNOSIS — R600 Localized edema: Secondary | ICD-10-CM | POA: Diagnosis not present

## 2024-09-01 DIAGNOSIS — F411 Generalized anxiety disorder: Secondary | ICD-10-CM

## 2024-09-01 DIAGNOSIS — G4701 Insomnia due to medical condition: Secondary | ICD-10-CM

## 2024-09-01 DIAGNOSIS — R110 Nausea: Secondary | ICD-10-CM

## 2024-09-01 DIAGNOSIS — G40909 Epilepsy, unspecified, not intractable, without status epilepticus: Secondary | ICD-10-CM | POA: Diagnosis not present

## 2024-09-01 DIAGNOSIS — F322 Major depressive disorder, single episode, severe without psychotic features: Secondary | ICD-10-CM

## 2024-09-01 DIAGNOSIS — K219 Gastro-esophageal reflux disease without esophagitis: Secondary | ICD-10-CM | POA: Diagnosis not present

## 2024-09-01 DIAGNOSIS — E538 Deficiency of other specified B group vitamins: Secondary | ICD-10-CM

## 2024-09-01 MED ORDER — ONDANSETRON HCL 4 MG PO TABS: 4.0000 mg | ORAL_TABLET | Freq: Three times a day (TID) | ORAL | 1 refills | Status: AC | PRN

## 2024-09-01 MED ORDER — FOLIC ACID 1 MG PO TABS: 1.0000 mg | ORAL_TABLET | Freq: Every day | ORAL | 2 refills | Status: AC

## 2024-09-01 MED ORDER — LEVETIRACETAM 750 MG PO TABS: 1500.0000 mg | ORAL_TABLET | Freq: Two times a day (BID) | ORAL | 1 refills | Status: AC

## 2024-09-01 MED ORDER — PANTOPRAZOLE SODIUM 40 MG PO TBEC: 40.0000 mg | DELAYED_RELEASE_TABLET | Freq: Every day | ORAL | 1 refills | Status: AC

## 2024-09-01 NOTE — Patient Instructions (Signed)
 Visit Information  Thank you for taking time to visit with me today. Please don't hesitate to contact me if I can be of assistance to you before our next scheduled appointment.  Your next care management appointment is by telephone on 09/08/24 at 3pm    Please call the care guide team at [phone] if you need to cancel, schedule, or reschedule an appointment.   Please call the Suicide and Crisis Lifeline: 988 call the [place]  National Suicide Prevention Lifeline: 463-460-1622 or TTY: 317-171-4149 TTY 650-550-8261) to talk to a trained counselor call 1-800-273-TALK (toll free, 24 hour hotline) call 911 if you are experiencing a Mental Health or Behavioral Health Crisis or need someone to talk to.  Sherrice Creekmore, LCSW [place]  [facility], [facility] Licensed Clinical Social Worker  Direct Dial: [phone]

## 2024-09-01 NOTE — Progress Notes (Signed)
 "  Established Patient Office Visit  Subjective   Patient ID: Abigail [name] [facility], female    DOB: 03-30-1966  Age: 59 y.o. MRN: [id number]  Chief Complaint  Patient presents with   Follow-up    Pt. Here for follow-up visit. Pt. C/o Lt hip pain, neck and back. Pt. Stat the pain as a 8.    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    She is adjusting to the loss of her toe.  It is painful but she already has painful peripheral neuropathy. She reports she staying nauseated a lot and eating a lot of saltine crackers because of it.  She asked for refill of ondansetron . She got into an argument with her brother and felt a seizure coming on and had a seizure the other night.  She did not go to the hospital.  She reports she is taking her seizure medicine correctly. She is very depressed but she has a virtual call with a psychiatrist tomorrow.  She is on Cymbalta  and Remeron .  She would like to stop taking the Remeron  because she has gained weight. Her legs are swollen even though she is wearing compression stockings.  She is taking to torsemide  20 mg 1 pill in the morning and 1 pill in the afternoon and she is also on 2 potassium supplements daily.  She is not certain how much her weight is up but she has not propped her feet up today and her legs are swollen.  Her base weight was 155 pounds and she is up to 168 today     Objective:     BP 112/73 (Cuff Size: Normal)   Pulse 80   Temp 98.3 F (36.8 C) (Oral)   Resp 17   Ht 5' 7 (1.702 m)   Wt 168 lb 9.6 oz (76.5 kg)   SpO2 (!) 87%   BMI 26.41 kg/m    Physical Exam Vitals and nursing note reviewed.  Constitutional:      Appearance: Normal appearance.   HENT:     Head: Normocephalic and atraumatic.  Eyes:     Conjunctiva/sclera: Conjunctivae normal.  Cardiovascular:     Rate and Rhythm: Normal rate and regular rhythm.  Pulmonary:     Effort: Pulmonary effort is normal.     Breath sounds: Normal breath sounds.  Musculoskeletal:     Right lower leg: No edema.     Left lower leg: No edema.  Skin:    General: Skin is warm and dry.  Neurological:     Mental Status: She is alert and oriented to person, place, and time.  Psychiatric:        Mood and Affect: Mood normal.        Behavior: Behavior normal.        Thought Content: Thought content normal.        Judgment: Judgment normal.          No results found for any visits on 09/01/24.    The 10-year ASCVD risk score (Arnett DK, et al., 2019) is: 9.7%    Assessment & Plan:  Seizure disorder (HCC) -     levETIRAcetam ; Take 2 tablets (1,500 mg total) by mouth 2 (two) times daily.  Dispense: 120 tablet; Refill: 1  Folate deficiency -     Folic Acid ; Take 1 tablet (1 mg total) by mouth daily.  Dispense: 90 tablet; Refill: 2  Gastroesophageal reflux disease, unspecified whether esophagitis present -     Pantoprazole  Sodium; Take 1 tablet (40 mg total) by mouth daily.  Dispense: 90 tablet; Refill: 1  MDD (major depressive disorder), single episode, severe (HCC)  GAD (generalized anxiety disorder)  Insomnia due to medical condition  Nausea -     Ondansetron  HCl; Take 1 tablet (4 mg total) by mouth every 8 (eight) hours as needed for nausea or vomiting.  Dispense: 60 tablet; Refill: 1  Peripheral edema Assessment & Plan: Her base weight is 155 and her weight is 168 today.  She has 2+ pitting edema in her legs even with her compression stockings on.  She is currently taking torsemide  20 mg twice a day and a potassium supplement twice a day.  Please increase to 2 acetamide 40 mg in the morning and 20 mg in the afternoon.  Please increase your potassium supplement to 3 a day  as well.  Continue taking this regimen until your weight gets back down to the 155.      Return in about 4 weeks (around 09/29/2024).    Abigail [name] K Edwina Grossberg, MD "

## 2024-09-01 NOTE — Assessment & Plan Note (Signed)
 Her base weight is 155 and her weight is 168 today.  She has 2+ pitting edema in her legs even with her compression stockings on.  She is currently taking torsemide  20 mg twice a day and a potassium supplement twice a day.  Please increase to 2 acetamide 40 mg in the morning and 20 mg in the afternoon.  Please increase your potassium supplement to 3 a day as well.  Continue taking this regimen until your weight gets back down to the 155.

## 2024-09-01 NOTE — Patient Outreach (Signed)
 Complex Care Management   Visit Note  09/01/2024  Name:  [facility]ail [name] MRN: [id number] DOB: 1966/06/12  Situation: Referral received for Complex Care Management related to caregiver stress I obtained verbal consent from Patient.  Visit completed with Caregiver  on the phone   Background:   Past Medical History:  Diagnosis Date   Anxiety    Arthritis    Back pain    Basal cell carcinoma 10/04/2021   R axilla - needs excised 11/28/21   Basal cell carcinoma 10/04/2021   L antecubital excised 11/14/21   Diarrhea 11/12/2016   Fibromyalgia    Generalized abdominal pain 11/12/2016   H. pylori infection    Hyperlipidemia    IBS (irritable bowel syndrome)    Infectious colitis 04/29/2016   Migraines    Moderate dehydration 04/29/2016   Muscle pain    Opioid overdose (HCC) 12/07/2022   Reflux    Seizures (HCC)    Unexplained weight loss 11/12/2016    Assessment: Patient Reported Symptoms:  Cognitive Cognitive Status: Alert and oriented to person, place, and time, Normal speech and language skills, Requires Assistance Decision Making Other: history of seizure disorder      Neurological Neurological Review of Symptoms: Headaches [place] Neurological Symptoms/Conditions [RPT]: Hisotry of seizure disorder Neurological Management Strategies: Medication therapy, Routine screening Neurological Comment: Dr. Candelario appointment 09/08/24  HEENT HEENT Symptoms Reported: No symptoms reported      Cardiovascular Cardiovascular Symptoms Reported: Not assessed    Respiratory Respiratory Symptoms Reported: No symptoms reported    Endocrine Endocrine Symptoms Reported: No symptoms reported Is patient diabetic?: No    Gastrointestinal Gastrointestinal Symptoms Reported: Constipation, Nausea Additional Gastrointestinal Details: has appointment with PCP today to discuss nausea and constipation-drinks alot of pepsi and saltine crackers Gastrointestinal Management Strategies:  Medication therapy Gastrointestinal Self-Management Outcome: 4 (good)    Genitourinary Genitourinary Symptoms Reported: Incontinence Additional Genitourinary Details: [facility] work and 2/4 to see doctor if infusion is needed-not enough protein in the blood and to check iron -currenlty taking iron  supplement Genitourinary Management Strategies: Incontinence garment/pad  Integumentary Integumentary Symptoms Reported: No symptoms reported Additional Integumentary Details: toe amuptation-no longer has stiches, no additional dressing changes    Musculoskeletal Musculoskelatal Symptoms Reviewed: Back pain, Joint pain Other Musculoskeletal Symptoms: Neuropathy in both feet, had second toe amputated -right foot 3 weeks ago , appointment wtth pain doctor-returns to pain doctor 1/29 at [facility]-nerve block done for back pain, per mother did not help pain-remains active with PT/OT Musculoskeletal Management Strategies: Medical device, Routine screening      Psychosocial Psychosocial Symptoms Reported: Depression - if selected complete PHQ 2-9 Other Psychosocial Conditions: scheduled with [facility] 09/09/24 Behavioral Management Strategies: Medication therapy Major Change/Loss/Stressor/Fears (CP): Medical condition, self Techniques to Cope with Loss/Stress/Change: Diversional activities Quality of Family Relationships: helpful, involved, supportive Do you feel physically threatened by others?: No    09/01/2024    PHQ2-9 Depression Screening   Little interest or pleasure in doing things    Feeling down, depressed, or hopeless    PHQ-2 - Total Score    Trouble falling or staying asleep, or sleeping too much    Feeling tired or having little energy    Poor appetite or overeating     Feeling bad about yourself - or that you are a failure or have let yourself or your family down    Trouble concentrating on things, such as reading the  newspaper or watching  television    Moving or speaking so slowly that other people could have noticed.  Or the opposite - being so fidgety or restless that you have been moving around a lot more than usual    Thoughts that you would be better off dead, or hurting yourself in some way    PHQ2-9 Total Score    If you checked off any problems, how difficult have these problems made it for you to do your work, take care of things at home, or get along with other people    Depression Interventions/Treatment      There were no vitals filed for this visit.    Medications Reviewed Today     Reviewed by Ermalinda Lenn HERO, LCSW (Social Worker) on 09/01/24 at 1509  Med List Status: <None>   Medication Order Taking? Sig Documenting Provider Last Dose Status Informant  acetaminophen  (TYLENOL ) 500 MG tablet [id number] Yes Take 1,000 mg by mouth every 6 (six) hours as needed for moderate pain (pain score 4-6). [provider]  Active Mother, Self  albuterol  (VENTOLIN  HFA) 108 [phone] Base) MCG/ACT inhaler [id number] Yes Inhale 2 puffs into the lungs every 6 (six) hours as needed for wheezing or shortness of breath. Take two (2) puffs by mouth every 4 to 6 hours as needed for wheezing or shortness of breath Tobie Calix, MD  Active   AMBULATORY NON FORMULARY MEDICATION [id number]  Knee-high, medium compression, graduated compression stockings. Apply to lower extremities. [email or web address], Zippered Compression Stockings, medium circ, long length [name], Susan K, MD  Active Mother, Self  bisacodyl  (DULCOLAX) 5 MG EC tablet [id number] Yes Take 1 tablet (5 mg total) by mouth daily as needed for moderate constipation. Maree Hue, MD  Active Mother, Self           Med Note GAYLENE, DARVIN JINNY Repress May 03, 2024 10:26 PM)    carbamazepine  (TEGRETOL  XR) 100 MG 12 hr tablet [id number] Yes Take 1 tablet (100 mg total) by mouth 2 (two) times daily. Wouk, Devaughn Sayres, MD  Active Mother, Self  Cholecalciferol  (VITAMIN  D-3 PO) [id number] Yes Take 1 capsule by mouth every morning. [provider]  Active Mother, Self  CREON  24000-76000 units CPEP [id number] Yes TAKE 1 CAPSULE (24,000 UNITS TOTAL) BY MOUTH 3 (THREE) TIMES DAILY WITH MEALS. Towana Small, FNP  Active   DULoxetine  (CYMBALTA ) 60 MG capsule [id number] Yes TAKE 1 CAPSULE BY MOUTH EVERY DAY [name], Susan K, MD  Active   feeding supplement (ENSURE PLUS HIGH PROTEIN) LIQD [id number] Yes Take 237 mLs by mouth 3 (three) times daily between meals. Von Bellis, MD  Active Mother, Self  folic acid  (FOLVITE ) 1 MG tablet [id number] Yes Take 1 tablet (1 mg total) by mouth daily. [name], Susan K, MD  Active Self  gabapentin  (NEURONTIN ) 300 MG capsule [id number] Yes Take 1 capsule (300 mg total) by mouth 4 (four) times daily. Maree Hue, MD  Active Mother, Self           Med Note ZENA NATHANAEL CROME   Wed Jun 10, 2024 10:40 AM) Taking 2 in the morning and 2 at night  lamoTRIgine  (LAMICTAL ) 25 MG tablet [id number] Yes Take 2 tablets (50 mg total) by mouth 2 (two) times daily. [name], Susan K, MD  Active Self  levETIRAcetam  (KEPPRA ) 750 MG tablet [id number] Yes Take 2 tablets (1,500 mg total) by mouth 2 (two) times daily. Wouk, Devaughn Sayres, MD  Active Mother, Self  Melatonin 10 MG TABS [id number] Yes  Take 10 mg by mouth at bedtime. [provider]  Active Mother, Self  midodrine  (PROAMATINE ) 5 MG tablet [id number] Yes Take 5 mg by mouth 3 (three) times daily with meals.  Patient taking differently: Take 5 mg by mouth 3 (three) times daily as needed.   [provider]  Active Mother, Self  mirtazapine  (REMERON ) 15 MG tablet [id number] Yes Take 1 tablet (15 mg total) by mouth at bedtime. No refills without a follow-up appointment. Eappen, Saramma, MD  Active   ondansetron  (ZOFRAN ) 4 MG tablet [id number] Yes Take 1 tablet (4 mg total) by mouth every 8 (eight) hours as needed for nausea or vomiting. Patel, Sona, MD  Active   Oxycodone  HCl 10 MG TABS  [id number] Yes Take 10 mg by mouth every 4 (four) hours as needed (Pain). [provider]  Active   pantoprazole  (PROTONIX ) 40 MG tablet [id number] Yes Take 1 tablet (40 mg total) by mouth daily. [name], Susan K, MD  Active Mother, Self  potassium chloride  (KLOR-CON  M) 10 MEQ tablet [id number] Yes Take 1 tablet (10 mEq total) by mouth 2 (two) times daily. [name], Susan K, MD  Active Self  torsemide  (DEMADEX ) 20 MG tablet [id number] Yes Take 1 tablet (20 mg total) by mouth 2 (two) times daily. [name], Susan K, MD  Active Self            Recommendation:   PCP Follow-up Specialty provider follow-up as scheduled Continue Current Plan of Care [facility] 08/2124  Follow Up Plan:   Telephone follow up appointment date/time:  09/08/24 3pm  Rameses Ou, LCSW [place]  [facility], [facility] Licensed Clinical Social Worker  Direct Dial: [phone]

## 2024-09-02 ENCOUNTER — Other Ambulatory Visit: Payer: Self-pay | Admitting: Pharmacist

## 2024-09-02 ENCOUNTER — Telehealth: Payer: Self-pay | Admitting: Pharmacist

## 2024-09-02 NOTE — Progress Notes (Signed)
" ° °  Outreach Note  09/02/2024 Name: Salli Bodin [facility] MRN: [id number] DOB: 07-19-1966  Referred by: [name], Susan K, MD  Was unable to reach patient via telephone today and unable to leave a message as voicemail is full  Follow Up Plan: Will collaborate with Care Guide team for assistance with reaching patient to reschedule this appointment   Sharyle Sia, PharmD, [facility] [phone]    "

## 2024-09-07 ENCOUNTER — Telehealth: Payer: Self-pay

## 2024-09-07 NOTE — Progress Notes (Unsigned)
 Complex Care Management Care Guide Note  09/07/2024 Name: Abigail [name] [facility] MRN: [id number] DOB: August 11, 1966  Abigail [name] is a 59 y.o. year old female who is a primary care patient of [name], Susan K, MD and is actively engaged with the care management team. I reached out to [facility] by phone today to assist with re-scheduling  with the Pharmacist.  Follow up plan: Unsuccessful telephone outreach attempt made. A HIPAA compliant phone message was left for the patient providing contact information and requesting a return call.  Jeoffrey Buffalo , RMA     [facility]  [facility], [facility] Guide  Direct Dial: [phone]  Website: [email or web address]

## 2024-09-08 ENCOUNTER — Other Ambulatory Visit: Payer: Self-pay | Admitting: *Deleted

## 2024-09-08 NOTE — Progress Notes (Signed)
 Ref Provider: [name], Susan, MD PCP: [name], Susan, MD Assessment and Plan:   In most patients we give written parts of assessment and plan to patient under Patient Instructions/After Visit Summary. So some parts are directed to patient.  Dear Abigail [name], It was our pleasure to participate in your care in person. We have typed up brief summary of what we discussed. 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 There is potential confusion regarding her medication regimen, as the patient's mother controls her medicines and the patient is unsure about the current medication schedule. Continued use of her current anti-seizure regimen is appropriate. Stimulant therapy is contraindicated due to increased seizure risk.  - Refill medications, lamotrigine , Levetiracetam  and Carbamazepine   - Advised against modafinil  due to increased seizure risk. - Scheduled follow-up in 5-6 months.  2. 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 The impact of biological aging and lifestyle factors on disease progression was discussed.  - Provided education regarding Alzheimer's pathology and the importance of healthy lifestyle modifications. - Scheduled follow-up in 5-6 months.  Dr. Maree has created some playlists on his YouTube channel that may benefit you. Consider going to: [email or web address]   Other ways to search: go to [email or web address]. Once there, find the search bar and type in [email or web address]. Select Dr. Jamal channel. Here you can click on the Playlists Tab and find the playlist that have been recommended to you.   CONSIDERED COMORBIDITIES BELOW Frailty  with recent compression fractures of left hip and bilateral pelvis  Suspected dementia with visual hallucinations  Shakiness/tremor - stress?  5-6 months with [name], PA-C  No follow-ups on file. This note has been created using automated tools and reviewed for accuracy by [facility]. Charge Rationale MDM LVL 4  Problem Complexity Level 4 Chronic illness with exacerbation or progression: 1 Stable chronic illness: 1  Data Complexity Minimal or none 1 tests ordered or reviewed.  Risk Moderate Prescription drug management Interim History date 09/08/2024   [name] is a 59 y.o. female here for treatment and evaluation of Seizures   [name] last visit was on 04/08/2024  Patient states she has had a couple of breakthrough seizures.  Her most recent being on 08/23/2024.  She states it was triggered by stress.  Taking tegretol  XR 100 mg two times a day,  vimpat  100 mg two times a day, Lamictal  25 mg two times a day, Keppra  1500 mg two times a day,   History of Present Illness Abigail [name] is a 59 year old woman with intractable localization-related epilepsy and Alzheimer's disease presenting for follow-up of breakthrough seizures and cognitive impairment.  INTERVAL HISTORY:  Seizure Activity and Triggers: - Ongoing intractable epilepsy with breakthrough seizures, most recently on August 23, 2024 - Seizure on August 23, 2024 associated with significant emotional stress from family conflict - Identifies emotional stress as a seizure trigger - No missed doses of anti-seizure medications; medications managed daily by her mother - Denies sleep deprivation, alcohol use, or missed doses as seizure triggers  Anti-Seizure and Other Medications: - Currently taking levetiracetam , lamotrigine ; uncertain about continued use of carbamazepine  - Not taking Targretin XR or Vimpat  - Has not started phenytoin  prescribed on September 03, 2024 - Also taking gabapentin ,  pantoprazole , bisacodyl , vitamin C, duloxetine , furosemide , mirtazapine , and oxycodone  -  No medication refills received since last visit approximately three months ago; requests refills at this visit - Inquires about restarting modafinil   Cognitive and Neuropsychiatric Symptoms: - Cognitive impairment and memory deficits - Visual hallucinations - Ongoing psychosocial stress related to family conflict  Physical Frailty and Pain: - Significant frailty - Chronic pain - Bilateral pelvic compression fractures - Frequent falls - Chronic low back pain - Lower extremity edema  PRIOR CLINICAL COURSE:  Epilepsy and Cognitive Decline: - Longstanding intractable localization-related epilepsy with poor seizure control despite multiple anti-seizure medications - Last neurology visit on April 16, 2024 - Underlying cognitive impairment and dementia - Alzheimer's pathology confirmed by elevated P-tau 217 protein  Medical Comorbidities and Hospitalizations: - History of chronic pancreatitis with pancreatic pseudocyst - Chronic opioid use - Pulmonary embolism - COPD - Osteopenia - Tobacco use disorder - Chronic dehydration - Rheumatoid arthritis - Cachexia - Hospitalized on August 05, 2024  Imaging and Cardiac Evaluation: - Last head CT performed in August 2025 - Echocardiogram completed on April 04, 2024  I reviewed labs, imaging, and notes in [place], [place], and from outside providers, if available.   Results Labs P-tau217: Elevated  Disease Summary: (Aggregate of information from previous visits)   Recurrent Epileptic Seizures, history of noncompliance with AEDs- First spell in 10/2022.  The events are characterized by shaking, brain fog, talking but not making sense.  They have occurred 6-7 times (non since April) and last for a few minutes.  Prior to the events she has no aura.  After the event patient has no symptoms.  She has some amnesia of the events (she  remembers some but not others). No known triggers.  The events are stereotypic.  Patient denies Family History of seizures.  Ms. Botto denied history of waking up at unusual location, waking up with unexplained bruises or sore body all over, episodes of unexplained loss of time, early morning myoclonic jerks, febrile seizure as child or known neurocutaneous stigmata.  She denies history of difficult or premature birth, head injury, meningitis, or encephalitis.    Per ED note from 12/18/2022 - She was found to be in focal status epilepticus during routine EEG testing today. She was transferred to [facility] for long-term EEG. She was started on LTM EEG , and is on Keppra , vimpat  and phenytoin  for seizure control.   Patient was admitted to hospital on 06/28/2023, due to acute encephaopathy and seizures. Levetiracetam  and phenytoin /dilantin  were restarted during hospital admission.  Per hospital note on 06/28/2023 - Patient is a 59 y.o. female with history of RA/GCA-no longer on prednisone, chronic pain on opiates, seizure disorder noncompliant with AEDs-presented with altered mental status-thought to have status epilepticus-transferred from [facility] to [facility] for LTM EEG. Neurology followed closely-AEDs adjusted-thankfully on 11/12-mental status started to improve. See below for further details.   Significant events: 11/8>> admit to [facility] 11/10>> transfer to ICU-for close monitoring-at risk for aspiration 11/11>>cortak tube placed-tube feeds started 11/12>> marked improvement in mental status-more awake-alert-following some commands. Transfer to progressive care.  Significant studies: 11/7>> CXR: Bibasilar atelectasis 11/7>> CT head: No acute intracranial abnormality 11/8-11/9>> LTM EEG: No seizures 11/9>> LTM EEG: Periodic discharges with triphasic morphology 11/10>> LTM EEG: Epileptogenicity arising from right parieto-occipital area 11/11>> LTM EEG: Evidence of epileptogenicity arising from the right  parieto-occipital region 11/12>> LTM EEG: Evidence of epileptogenicity from the right parieto-occipital area  Per [facility] note 06/28/2023 Had severe encephalopathy for several days since admit to [facility] improving on 11/12-now rapidly  improving-completely awake and alert this morning. She has decided to leave the hospital-has already called multiple family members-apparently her son is on the way to the hospital to pick her up. She has refused to go to [facility] She will be continued on Keppra /Vimpat /phenytoin  as recommended by neurology She was asked to follow-up with outpatient neurologist Extensive seizure precautions including driving restrictions was discussed with patient in detail.   On 07/24/2023 during patient's follow up visit, we recommended patient stay on current anti-seizure medication regimen (Lacosamide , Levetiracetam , and phenytoin /dilantin ) given recent breakthrough seizure and good seizure control.  Medications: phenytoin  (stopped), lacosamide  (stopped), Keppra , carbamazepine   IMPRESSION: This study showed focal motor status epilepticus arising from left and right occipital region. Clinically patient was confused and had left eye jerking. Additionally there was severe diffuse encephalopathy. Per Dr. Shelton  12/14/2022 CT Head without contrast  IMPRESSION: No acute intracranial abnormality. Mild white matter changes are stable. Chronic left maxillary sinus disease.   12/18/2022 MRI Brain without contrast  IMPRESSION: Relatively symmetric bilateral parieto-occipital signal abnormality with regions of true diffusion restriction. Findings could be seen in the setting of PRES or severe hypoglycemic metabolic encephalopathy.   12/18/2022 EEG Adult  IMPRESSION: This study showed focal motor status epilepticus arising from left and right occipital region. Clinically patient was confused and had left eye jerking. Additionally there was severe diffuse encephalopathy.    Depression and Anxiety - Long-standing depression, exacerbated by recent (08/2023) significant losses (fianc and best friend). Experiences panic attacks and anxiety.  History of opioid abuse - Patient has a history of opioid abuse and went to the ED 12/07/2022 for an opioid overdose.  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 Recent episode involved left leg pain, rigidity, and movements, atypical for seizures. Subtherapeutic levels of levetiracetam , lamotrigine , and carbamazepine  likely due to medication noncompliance. Emphasized consistent medication intake to avoid side effects from unnecessary dose increases.  04/07/2024 CT Head & C-Spine Wo Contrast  IMPRESSION:  1. Negative non contrasted CT appearance of the brain.  2. Straightening of the cervical spine with postsurgical changes C5  through C7. No acute osseous abnormality.   04/04/2024 Echocardiogram  IMPRESSIONS   1. Left ventricular ejection fraction, by estimation, is 60 to 65%. The left ventricle has normal function. The left ventricle has no regional wall motion abnormalities. Left ventricular diastolic parameters were normal.   2. Right ventricular systolic function is normal. The right ventricular size is normal.   3. The mitral valve is normal in structure. No evidence of mitral valve regurgitation.   4. The aortic valve was not well visualized. Aortic valve regurgitation is not visualized.    Medications: levetiracetam , lamotrigine , carbamazepine    Carbamazepine  (dispensed 04/07/2024),  Lacosamide  (dispensed 09/10/2023 - 3 refills) Lamotrigine  (dispensed 10/26/2023 - 0 refills) Keppra  (dispensed 11/12/2023 - 3 refills) Dilantin  (dispensed 11/13/2023 - 3 refills) She  states that she is needing a refill on Modafinil  (dispensed 08/07/2023 - 1 refill)  Addendum 03/25/2024, report from Elida Ross MD [facility]) 12/29/2023   - Intractable epilepsy with noncompliance with her outpatient regimen. She presented with fever and encephalopathy. Subsequently found to have both colitis as well as acute pulmonary embolism which explains both findings.I am not concerned about status epilepticus at this time.She is suppose to be taking Keppra  1500 twice daily, Lamotrigine   50 twice daily, Carbamazepine  100 twice daily. She has been on lacosamide  in the past but self-discounted 2/2 reported side effect of lethargy  Medication noncompliance Poor compliance with anti-seizure medications contributing to subtherapeutic drug levels. Discussed potential reasons for noncompliance, including affordability and forgetfulness, and addressed these by involving a caregiver.  Chronic low back pain  Swelling of extremities Swelling in hands, feet, and legs. Advised to increase physical activity to improve circulation and reduce swelling. Explained the role of calf muscles in venous return and the importance of movement. Primary care physician to manage further evaluation and treatment.  Frailty with recent compression fractures of left hip and bilateral pelvis Recent compression fractures in left hip and bilateral pelvis. Sedentary lifestyle contributing to frailty, increasing fracture risk due to muscle disintegration and bone weakness.  Suspected dementia with visual hallucinations Suspected dementia due to visual hallucinations and memory concerns. Early onset dementia considered due to age of 28. Blood test available to check for underlying dementia.  Shakiness/tremor - stress? Shakiness and tremor with concerns about potential Parkinson's disease. Risks of modafinil  include increased agitation, shaking, and heart issues.  Chronic pancreatitis  Pancreatic  pseudocyst  General Exam:   There were no vitals filed for this visit. There is no height or weight on file to calculate BMI.  We were not able to do thorough neurological exam during this video visit. (e.g. fundoscopic exam, vestibular exam, subtle weakness or sensory deficit evaluation)  Below exam was noted to be normal for age except specified.  Patient was carefully observed during this video communication for any neurological abnormalities in body language and physical cues.  Detailed mental status exam was not performed but she was able to provide through history and follow all commands during this video visit.  she was able to charles schwab well.  Patient was asked to bring her face close to camera, have voluntary eye movements in all directions.  Patient was asked to squint her eyes, smile, show teeth, protrude tongue, say aah.  Patient was asked to extend her arms out.  Patient was asked to open and close her hands rapidly and do other rapid hand movements.  Patient was asked to touch an object and touch her nose repetitively and rapidly.  Patient was asked to adjust camera in a way that provider can see her walk.   Patient was asked to draw a line around the area of sensory changes.  Based on previous exam:  Physical Exam    General exam 09/17/23 Decreased breath sounds bilaterally Neurological exam appropriate for the patient's condition was performed.  08/07/24 During video visit on 08/08/2023 patient was not ill-appearing. Patient continued to nod-off appearing drowsy    Medications: Current Outpatient Medications on File Prior to Visit  Medication Sig Dispense Refill   albuterol  MDI, PROVENTIL , VENTOLIN , PROAIR , HFA 90 mcg/actuation inhaler Inhale 2 inhalations into the lungs every 6 (six) hours as needed for Shortness of Breath     bisacodyL  (DULCOLAX) 5 mg EC tablet Take 5 mg by mouth once daily as needed for Constipation     carBAMazepine   (TEGRETOL  XR) 100 MG XR tablet Take 1 tablet (100 mg total) by mouth 2 (two) times daily 180 tablet 1   cholecalciferol  1000 unit tablet Take by mouth     cyclobenzaprine  (FLEXERIL ) 10 MG tablet Take by mouth     DULoxetine  (CYMBALTA ) 60 MG DR capsule Take 60 mg by mouth once daily     FUROsemide  (LASIX ) 40 MG tablet Take  40 mg by mouth once daily     gabapentin  (NEURONTIN ) 300 MG capsule TAKE 1 CAP IN MORNING, 1 CAP IN AFTERNOON, 2 CAP IN EVENING     lacosamide  (VIMPAT ) 100 mg tablet Take 100 mg by mouth every 12 (twelve) hours     lamoTRIgine  (LAMICTAL ) 25 MG tablet Take 25 mg by mouth 2 (two) times daily     levETIRAcetam  (KEPPRA ) 750 MG tablet Take 2 tablets (1,500 mg total) by mouth 2 (two) times daily 360 tablet 1   lidocaine  (SALONPAS) 4 % patch Place 2 patches onto the skin     megestroL  (MEGACE ) 400 mg/10 mL (40 mg/mL) suspension Take 200 mg by mouth once daily     mirtazapine  (REMERON ) 7.5 MG tablet Take 7.5 mg by mouth at bedtime     ondansetron  (ZOFRAN -ODT) 8 MG disintegrating tablet Take 8 mg by mouth every 8 (eight) hours as needed for Nausea     oxyCODONE  (OXYCONTIN ) 10 mg ER tablet Take 10 mg by mouth every 4 (four) hours     pantoprazole  (PROTONIX ) 40 MG DR tablet Take 1 tablet (40 mg total) by mouth once daily 90 tablet 1   polyethylene glycol (MIRALAX ) powder Take 17 g by mouth 2 (two) times daily as needed for Constipation     potassium chloride  (KLOR-CON ) 10 MEQ ER tablet Take 10 mEq by mouth once daily with food     phenytoin  (DILANTIN ) 100 MG ER capsule Take 1 capsule (100 mg total) by mouth 2 (two) times daily (Patient not taking: Reported on 05/28/2024) 180 capsule 3   No current facility-administered medications on file prior to visit.    Past Medical History:  Past Medical History:  Diagnosis Date   Bleeds easily ()    Clostridium difficile infection    Depression    Dyslipidemia    Fibromyalgia    GERD (gastroesophageal reflux disease)     Helicobacter pylori gastritis    Migraines    Rheumatoid arthritis (CMS/HHS-HCC)    Spinal stenosis of lumbar region    Thyroid  disease     Past Surgical History:  Past Surgical History:  Procedure Laterality Date   OTHER SURGERY  08/20/2009   Cervical fusion   OTHER SURGERY  08/20/2009   Lumbar surgery   HYSTERECTOMY  07/02/2011   LSH   SALPINGECTOMY Right 07/02/2011   SALPINGOOPHORECTOMY Left 07/02/2011   ARTHROPLASTY HIP TOTAL Right 08/07/2012   APPENDECTOMY     Foot surgery Bilateral 07/2011 & 06/2012   Family History:  Family History  Problem Relation Name Age of Onset   Diabetes Father     High blood pressure (Hypertension) Father     Social History:  Social History   Socioeconomic History   Marital status: Divorced  Tobacco Use   Smoking status: Some Days    Current packs/day: 1.00    Types: Cigarettes    Passive exposure: Current   Smokeless tobacco: Never  Vaping Use   Vaping status: Some Days   Substances: Nicotine , Flavoring  Substance and Sexual Activity   Alcohol use: Never   Drug use: No   Sexual activity: Not Currently   Social Drivers of Health   Financial Resource Strain: Medium Risk (06/09/2024)   Received from [facility]   Overall Financial Resource Strain (CARDIA)    How hard is it for you to pay for the very basics like food, housing, medical care, and heating?: Somewhat hard  Food Insecurity: No Food Insecurity (08/06/2024)   Received  from [facility]   Hunger Vital Sign    Within the past 12 months, you worried that your food would run out before you got the money to buy more.: Never true    Within the past 12 months, the food you bought just didn't last and you didn't have money to get more.: Never true  Transportation Needs: No Transportation Needs (08/06/2024)   Received from [facility] - Transportation    In the past 12 months, has lack of transportation kept you from medical appointments  or from getting medications?: No    In the past 12 months, has lack of transportation kept you from meetings, work, or from getting things needed for daily living?: No  Physical Activity: Inactive (01/27/2024)   Exercise Vital Sign    Days of Exercise per Week: 0 days    Minutes of Exercise per Session: 0 min  Stress: Stress Concern Present (06/09/2024)   Received from [facility] - Occupational Stress Questionnaire    Do you feel stress - tense, restless, nervous, or anxious, or unable to sleep at night because your mind is troubled all the time - these days?: To some extent  Social Connections: Moderately Isolated (01/27/2024)   Social Connection and Isolation Panel    Frequency of Communication with Friends and Family: More than three times a week    Frequency of Social Gatherings with Friends and Family: Twice a week    Attends Religious Services: 1 to 4 times per year    Active Member of [employer] or Organizations: No    Attends Banker Meetings: Never    Marital Status: Divorced  Housing Stability: Unknown (03/04/2024)   Housing Stability Vital Sign    Homeless in the Last Year: No   Allergies:  Allergies  Allergen Reactions   Nsaids (Non-Steroidal Anti-Inflammatory Drug) Hives   Nucynta [Tapentadol] Swelling, Rash and Other (See Comments)    Made her deathly sick   Codeine Hives and Itching   Adhesive Tape-Silicones Other (See Comments)   Keflex  [Cephalexin ] Unknown   Latex Unknown   Mobic [Meloxicam] Rash   Propoxyphene N-Acetaminophen  Other (See Comments)   Sulfa (Sulfonamide Antibiotics) Hives   This video encounter was conducted with the patient's (or proxy's) verbal consent via secure, interactive audio and video telecommunications while away from clinic/office/hospital.  The patient (or proxy) was instructed to have this encounter in a suitably private space and to only have persons present to whom  they give permission to participate. In addition, patient identity was confirmed by use of name plus two identifiers.  This visit was coded based on medical decision making (MDM).  Dr. Jannett Fairly

## 2024-09-09 ENCOUNTER — Ambulatory Visit: Payer: Self-pay

## 2024-09-09 ENCOUNTER — Telehealth: Admitting: Psychiatry

## 2024-09-09 DIAGNOSIS — F322 Major depressive disorder, single episode, severe without psychotic features: Secondary | ICD-10-CM

## 2024-09-09 NOTE — Progress Notes (Signed)
 Patient unable to connect due to network issues.  Communicated with staff patient to reschedule appointment for an in office visit.

## 2024-09-09 NOTE — Patient Instructions (Signed)
 Visit Information  Thank you for taking time to visit with me today. Please don't hesitate to contact me if I can be of assistance to you before our next scheduled appointment.  Your next care management appointment is by telephone on 09/24/24 at 1pm    Please call the care guide team at [phone] if you need to cancel, schedule, or reschedule an appointment.   Please call the Suicide and Crisis Lifeline: 988 call the [place]  National Suicide Prevention Lifeline: (323)033-0597 or TTY: 978-230-6301 TTY 7070609589) to talk to a trained counselor call 1-800-273-TALK (toll free, 24 hour hotline) call 911 if you are experiencing a Mental Health or Behavioral Health Crisis or need someone to talk to.  Macenzie Burford, LCSW [place]  [facility], [facility] Licensed Clinical Social Worker  Direct Dial: [phone]

## 2024-09-09 NOTE — Patient Outreach (Signed)
 Complex Care Management   Visit Note  09/09/2024  Name:  Abigail [name] [facility] MRN: [id number] DOB: Dec 20, 1965  Situation: Referral received for Complex Care Management related to caregiver stress I obtained verbal consent from Patient. Visit completed with Caregiver on the phone on 09/08/24 Background:   Past Medical History:  Diagnosis Date   Anxiety    Arthritis    Back pain    Basal cell carcinoma 10/04/2021   R axilla - needs excised 11/28/21   Basal cell carcinoma 10/04/2021   L antecubital excised 11/14/21   Diarrhea 11/12/2016   Fibromyalgia    Generalized abdominal pain 11/12/2016   H. pylori infection    Hyperlipidemia    IBS (irritable bowel syndrome)    Infectious colitis 04/29/2016   Migraines    Moderate dehydration 04/29/2016   Muscle pain    Opioid overdose (HCC) 12/07/2022   Reflux    Seizures (HCC)    Unexplained weight loss 11/12/2016    Assessment: Patient Reported Symptoms:  Cognitive Cognitive Status: Requires Assistance Decision Making, Alert and oriented to person, place, and time Cognitive/Intellectual Conditions Management [RPT]: Other Other: history of seizure disorder      Neurological Neurological Review of Symptoms: Not assessed    HEENT HEENT Symptoms Reported: No symptoms reported      Cardiovascular Cardiovascular Symptoms Reported: Not assessed    Respiratory Respiratory Symptoms Reported: Not assesed    Endocrine Endocrine Symptoms Reported: Not assessed    Gastrointestinal Gastrointestinal Symptoms Reported: Not assessed      Genitourinary Genitourinary Symptoms Reported: Not assessed    Integumentary Integumentary Symptoms Reported: Not assessed    Musculoskeletal Musculoskelatal Symptoms Reviewed: Not assessed        Psychosocial Psychosocial Symptoms Reported: Depression - if selected complete PHQ 2-9 Additional Psychological Details: confirmed that patient has a follow up appointment with [facility] on 09/09/24-confirmed that patient will be sent a link to click on inorder to complete the appointment virtually-patient's mother confirmed plan to complete appointment using the link Behavioral Management Strategies: Medication therapy Major Change/Loss/Stressor/Fears (CP): Medical condition, self Behaviors When Feeling Stressed/Fearful: medication management Techniques to Cope with Loss/Stress/Change: Diversional activities Quality of Family Relationships: helpful, involved, supportive    09/09/2024    PHQ2-9 Depression Screening   Little interest or pleasure in doing things    Feeling down, depressed, or hopeless    PHQ-2 - Total Score    Trouble falling or staying asleep, or sleeping too much    Feeling tired or having little energy    Poor appetite or overeating     Feeling bad about yourself - or that you are a failure or have let yourself or your family down    Trouble concentrating on things, such as reading the newspaper or watching television    Moving or speaking so slowly that other people could have noticed.  Or the opposite - being so fidgety or restless that you have been moving around a lot more than usual    Thoughts that you would be better off dead, or hurting yourself in some way    PHQ2-9 Total Score    If you checked off any problems, how difficult have these problems made it for you to do your work, take care of things at home, or get along with other people    Depression Interventions/Treatment      There were no vitals filed for this visit.    Medications Reviewed Today  Reviewed by Ermalinda Lenn HERO, LCSW (Social Worker) on 09/08/24 at 1518  Med List Status: <None>   Medication Order Taking? Sig Documenting Provider Last Dose Status Informant  acetaminophen  (TYLENOL ) 500 MG tablet [id number] Yes Take 1,000 mg by mouth every 6 (six) hours as needed for moderate pain (pain score 4-6). [provider]  Active Mother, Self  albuterol   (VENTOLIN  HFA) 108 [phone] Base) MCG/ACT inhaler [id number] Yes Inhale 2 puffs into the lungs every 6 (six) hours as needed for wheezing or shortness of breath. Take two (2) puffs by mouth every 4 to 6 hours as needed for wheezing or shortness of breath Tobie Calix, MD  Active   AMBULATORY NON FORMULARY MEDICATION [id number] Yes Knee-high, medium compression, graduated compression stockings. Apply to lower extremities. [email or web address], Zippered Compression Stockings, medium circ, long length [name], Susan K, MD  Active Mother, Self  bisacodyl  (DULCOLAX) 5 MG EC tablet [id number] Yes Take 1 tablet (5 mg total) by mouth daily as needed for moderate constipation. Maree Hue, MD  Active Mother, Self           Med Note GAYLENE, ANJA J   Sun May 03, 2024 10:26 PM)    carbamazepine  (TEGRETOL  XR) 100 MG 12 hr tablet [id number] Yes Take 1 tablet (100 mg total) by mouth 2 (two) times daily. Wouk, Devaughn Sayres, MD  Active Mother, Self  Cholecalciferol  (VITAMIN D -3 PO) [id number] Yes Take 1 capsule by mouth every morning. [provider]  Active Mother, Self  CREON  24000-76000 units CPEP [id number] Yes TAKE 1 CAPSULE (24,000 UNITS TOTAL) BY MOUTH 3 (THREE) TIMES DAILY WITH MEALS. Towana Small, FNP  Active   DULoxetine  (CYMBALTA ) 60 MG capsule [id number] Yes TAKE 1 CAPSULE BY MOUTH EVERY DAY [name], Susan K, MD  Active   feeding supplement (ENSURE PLUS HIGH PROTEIN) LIQD [id number] Yes Take 237 mLs by mouth 3 (three) times daily between meals. Von Bellis, MD  Active Mother, Self  folic acid  (FOLVITE ) 1 MG tablet [id number] Yes Take 1 tablet (1 mg total) by mouth daily. [name], Susan K, MD  Active   gabapentin  (NEURONTIN ) 300 MG capsule [id number] Yes Take 1 capsule (300 mg total) by mouth 4 (four) times daily. Maree Hue, MD  Active Mother, Self           Med Note ZENA NATHANAEL CROME   Wed Jun 10, 2024 10:40 AM) Taking 2 in the morning and 2 at night  lamoTRIgine  (LAMICTAL ) 25 MG tablet [id number] Yes Take 2  tablets (50 mg total) by mouth 2 (two) times daily. [name], Susan K, MD  Active Self  levETIRAcetam  (KEPPRA ) 750 MG tablet [id number] Yes Take 2 tablets (1,500 mg total) by mouth 2 (two) times daily. [name], Susan K, MD  Active   Melatonin 10 MG TABS [id number] Yes Take 10 mg by mouth at bedtime. [provider]  Active Mother, Self  midodrine  (PROAMATINE ) 5 MG tablet [id number] Yes Take 5 mg by mouth 3 (three) times daily with meals.  Patient taking differently: Take 5 mg by mouth 3 (three) times daily as needed.   [provider]  Active Mother, Self  mirtazapine  (REMERON ) 15 MG tablet [id number] Yes Take 1 tablet (15 mg total) by mouth at bedtime. No refills without a follow-up appointment. Eappen, Saramma, MD  Active   ondansetron  (ZOFRAN ) 4 MG tablet [id number] Yes Take 1 tablet (4 mg total) by mouth every 8 (eight) hours as needed for nausea or vomiting. [name], Susan K, MD  Active  Oxycodone  HCl 10 MG TABS [id number] Yes Take 10 mg by mouth every 4 (four) hours as needed (Pain). [provider]  Active   pantoprazole  (PROTONIX ) 40 MG tablet [id number] Yes Take 1 tablet (40 mg total) by mouth daily. [name], Susan K, MD  Active   potassium chloride  (KLOR-CON  M) 10 MEQ tablet [id number] Yes Take 1 tablet (10 mEq total) by mouth 2 (two) times daily. [name], Susan K, MD  Active Self  torsemide  (DEMADEX ) 20 MG tablet [id number] Yes Take 1 tablet (20 mg total) by mouth 2 (two) times daily. [name], Susan K, MD  Active Self            Recommendation:   PCP Follow-up Specialty provider follow-up as scheduled Continue Current Plan of Care [facility] 09/09/24  Follow Up Plan:   Telephone follow up appointment date/time:  09/24/24 1pm  Akiva Josey, LCSW [place]  [facility], [facility] Licensed Clinical Social Worker  Direct Dial: [phone]

## 2024-09-09 NOTE — Telephone Encounter (Signed)
 FYI Only or Action Required?: FYI only for provider: appointment scheduled on 1/22.  Patient was last seen in primary care on 09/01/2024 by [name], Susan K, MD.  Called Nurse Triage reporting Appointment.  Symptoms began over a year ago.  Interventions attempted: Prescription medications: tegretol .  Symptoms are: stable.  Triage Disposition: See PCP Within 2 Weeks  Patient/caregiver understands and will follow disposition?: Yes  Reason for Triage: allergic reaction- carbamazepine  (TEGRETOL  XR) 100 MG 12 hr tablet [[id number]] , dizziness, nausea, constipation, diarrhea    Reason for Disposition  Requesting regular office appointment  Answer Assessment - Initial Assessment Questions 1. REASON FOR CALL: What is the main reason for your call? or How can I best help you?     Pt wanting to ask Dr. [name] about discontinuing Tegretol . Denies acute symptoms at this time. Consuelo with [facility] with patient.  Protocols used: Information Only Call - No Triage-A-AH

## 2024-09-10 ENCOUNTER — Telehealth (INDEPENDENT_AMBULATORY_CARE_PROVIDER_SITE_OTHER): Admitting: Family Medicine

## 2024-09-10 DIAGNOSIS — J441 Chronic obstructive pulmonary disease with (acute) exacerbation: Secondary | ICD-10-CM | POA: Diagnosis not present

## 2024-09-10 DIAGNOSIS — G40909 Epilepsy, unspecified, not intractable, without status epilepticus: Secondary | ICD-10-CM

## 2024-09-10 DIAGNOSIS — E8770 Fluid overload, unspecified: Secondary | ICD-10-CM

## 2024-09-10 DIAGNOSIS — J411 Mucopurulent chronic bronchitis: Secondary | ICD-10-CM

## 2024-09-10 DIAGNOSIS — I509 Heart failure, unspecified: Secondary | ICD-10-CM | POA: Diagnosis not present

## 2024-09-10 MED ORDER — GUAIFENESIN DM 10-100 MG/5ML PO LIQD: 5.0000 mL | Freq: Four times a day (QID) | ORAL | 0 refills | Status: AC

## 2024-09-10 MED ORDER — DOXYCYCLINE HYCLATE 100 MG PO CAPS: 100.0000 mg | ORAL_CAPSULE | Freq: Two times a day (BID) | ORAL | 0 refills | Status: AC

## 2024-09-10 NOTE — Progress Notes (Signed)
 Virtual Visit via Video Note  I connected with Abigail [name], Abigail [name], Abigail [name] on 09/10/24 at 10:50 AM EST by a video enabled telemedicine application and verified that I am speaking with the correct person using two identifiers.  Location: Patient: at her home Provider: In office at [name]   I discussed the limitations of evaluation and management by telemedicine and the availability of in person appointments. The patient expressed understanding and agreed to proceed.  History of Present Illness: She states that she is doing okay but she is nauseated a lot and her appetite is decreased.  She is taking nausea medicine twice a day.  She thinks that maybe her carbamazepine  may be causing the side effects.  Her neurologist is Dr. Loreli at [facility] and she has seen him recently.  He did not change any of her medication.  She has a history of intractable seizures and takes Lamictal , Keppra , carbamazepine .  She states that she had a seizure last week and emotional stress brought it on.    She feels like she has got a COPD exacerbation because she has a productive cough.  She denies fevers she also denies wheezing.  She has an albuterol  inhaler but she has not noticed wheezing.  She is requesting a cough syrup.  Her weight is 159 today.  Her weight is down about 10 pounds today.  Looks like her base weight is about 158.  She has been taking torsemide  20 mg three times a day with potassium for about 10 days.    Observations/Objective:   Assessment and Plan: COPD exacerbation: Doxycycline  100 mg twice daily for 10 days.  Holding on steroids as she is not wheezing and does not have shortness of breath.  DM-guaifenesin  cough syrup.  CHF with fluid overload: Has been taking torsemide  20mg  TID for about 10 days and her weight is back down to 159.  Her base weight is 158.  OK to decrease the torsemide  back to 20mg  BID with potassium.  Nausea and decreased appetite: She thinks carbamazepine  may be  causing these symptoms and she wants to stop taking it.  Just saw Neurology, Dr. Maree,  and there was no change in her medications.  She has a history of intractable seizures.  She is taking ondansetron  twice a day to control the nausea.  Come in one morning before you have taken your am dose of tegretol  and we will check your level.     Follow Up Instructions:    I discussed the assessment and treatment plan with the patient. The patient was provided an opportunity to ask questions and all were answered. The patient agreed with the plan and demonstrated an understanding of the instructions.   The patient was advised to call back or seek an in-person evaluation if the symptoms worsen or if the condition fails to improve as anticipated.  I provided 9 minutes of non-face-to-face time during this encounter.   Abigail Amstutz K Rolland Steinert, MD

## 2024-09-10 NOTE — Addendum Note (Signed)
 Addended by: Braulio Kiedrowski K on: 09/10/2024 12:11 PM   Modules accepted: Orders

## 2024-09-11 NOTE — Progress Notes (Signed)
 Complex Care Management Care Guide Note  09/11/2024 Name: Abigail [name] [facility] MRN: [id number] DOB: [birth year]  [name] is a 59 y.o. year old female who is a primary care patient of [name], Susan K, MD and is actively engaged with the care management team. I reached out to [facility] by phone today to assist with re-scheduling  with the Pharmacist.  Follow up plan: Unsuccessful telephone outreach attempt made. A HIPAA compliant phone message was left for the patient providing contact information and requesting a return call.  Jeoffrey Buffalo , RMA     [facility]  [facility], [facility] Guide  Direct Dial: [phone]  Website: [email or web address]

## 2024-09-14 ENCOUNTER — Telehealth: Payer: Self-pay

## 2024-09-16 ENCOUNTER — Inpatient Hospital Stay: Attending: Oncology

## 2024-09-16 DIAGNOSIS — Z86711 Personal history of pulmonary embolism: Secondary | ICD-10-CM | POA: Diagnosis present

## 2024-09-16 DIAGNOSIS — D649 Anemia, unspecified: Secondary | ICD-10-CM | POA: Insufficient documentation

## 2024-09-16 LAB — CBC (CANCER CENTER ONLY)
HCT: 51.7 % — ABNORMAL HIGH (ref 36.0–46.0)
Hemoglobin: 17.1 g/dL — ABNORMAL HIGH (ref 12.0–15.0)
MCH: 29.7 pg (ref 26.0–34.0)
MCHC: 33.1 g/dL (ref 30.0–36.0)
MCV: 89.8 fL (ref 80.0–100.0)
Platelet Count: 265 10*3/uL (ref 150–400)
RBC: 5.76 MIL/uL — ABNORMAL HIGH (ref 3.87–5.11)
RDW: 16.6 % — ABNORMAL HIGH (ref 11.5–15.5)
WBC Count: 8.6 10*3/uL (ref 4.0–10.5)
nRBC: 0 % (ref 0.0–0.2)

## 2024-09-16 LAB — FERRITIN: Ferritin: 35 ng/mL (ref 11–307)

## 2024-09-16 LAB — IRON AND TIBC
Iron: 381 ug/dL — ABNORMAL HIGH (ref 28–170)
Saturation Ratios: 83 % — ABNORMAL HIGH (ref 10.4–31.8)
TIBC: 461 ug/dL — ABNORMAL HIGH (ref 250–450)
UIBC: 80 ug/dL

## 2024-09-16 LAB — VITAMIN B12: Vitamin B-12: 2803 pg/mL — ABNORMAL HIGH (ref 180–914)

## 2024-09-17 ENCOUNTER — Other Ambulatory Visit: Payer: Self-pay

## 2024-09-17 MED ORDER — HYDROMORPHONE HCL 4 MG PO TABS: 4.0000 mg | ORAL_TABLET | Freq: Every day | ORAL | 0 refills | Status: AC | PRN

## 2024-09-17 MED ORDER — HYDROMORPHONE HCL 4 MG PO TABS
4.0000 mg | ORAL_TABLET | Freq: Every day | ORAL | 0 refills | Status: AC | PRN
  Filled 2024-09-17: qty 150, 30d supply, fill #0

## 2024-09-22 ENCOUNTER — Telehealth: Payer: Self-pay | Admitting: *Deleted

## 2024-09-22 NOTE — Telephone Encounter (Signed)
 The patient called asking whether she should keep her appointments with you tomorrow (09/23/24). She was scheduled to come in for possible IV Venofer . Her ferritin and iron  levels have improved, but her hemoglobin has increased to 17.1, which I reviewed with her. She is having difficulty securing transportation and is considering rescheduling, but stated she will come if you feel it is still necessary. Given the rise in her hemoglobin, would you like to add any additional labs? I asked if she had been acutely ill in the past few months, and she reported a recent URI treated with doxycycline  when the last few weeks. She had her toe amputated in the last few months as well. She also reports having an RA flare at the moment.   Please advise.

## 2024-09-23 ENCOUNTER — Inpatient Hospital Stay: Admitting: Oncology

## 2024-09-23 ENCOUNTER — Inpatient Hospital Stay

## 2024-09-23 ENCOUNTER — Encounter: Payer: Self-pay | Admitting: Oncology

## 2024-09-23 ENCOUNTER — Other Ambulatory Visit: Payer: Self-pay

## 2024-09-23 DIAGNOSIS — D649 Anemia, unspecified: Secondary | ICD-10-CM

## 2024-09-23 NOTE — Telephone Encounter (Signed)
 Vm left x 2. To attempt to provide Dr. Layvonne recommendations. No answer. Vm left

## 2024-09-23 NOTE — Telephone Encounter (Signed)
 Per Dr. Babara- Iron  panel may be altered with recent infection. She can reschedule appt in 3 months.Abigail [name]

## 2024-09-23 NOTE — Telephone Encounter (Signed)
 Contacted patient. Caller verified using pt's full name and dob prior to discussing PHI

## 2024-09-24 ENCOUNTER — Encounter: Payer: Self-pay | Admitting: *Deleted

## 2024-09-24 ENCOUNTER — Other Ambulatory Visit: Payer: Self-pay | Admitting: *Deleted

## 2024-09-24 NOTE — Patient Instructions (Signed)
 [facility] - I am sorry I was unable to reach you today for our scheduled appointment. I work with [name], Susan K, MD and am calling to support your healthcare needs. Please contact me at [phone] at your earliest convenience. I look forward to speaking with you soon.   Thank you,    Annete Ayuso, LCSW [place]  [facility], [facility] Licensed Clinical Social Worker  Direct Dial: [phone]

## 2024-10-02 ENCOUNTER — Ambulatory Visit: Admitting: Family Medicine

## 2024-10-07 ENCOUNTER — Telehealth: Admitting: *Deleted

## 2024-10-26 ENCOUNTER — Ambulatory Visit: Admitting: Psychiatry

## 2024-12-21 ENCOUNTER — Inpatient Hospital Stay

## 2024-12-23 ENCOUNTER — Inpatient Hospital Stay

## 2024-12-23 ENCOUNTER — Inpatient Hospital Stay: Admitting: Oncology
